# Patient Record
Sex: Female | Born: 1960
Health system: Southern US, Community
[De-identification: ages and names within clinical notes are randomized; demographics above are authoritative.]

## PROBLEM LIST (undated history)

## (undated) DIAGNOSIS — Z923 Personal history of irradiation: Secondary | ICD-10-CM

## (undated) DIAGNOSIS — M1990 Unspecified osteoarthritis, unspecified site: Secondary | ICD-10-CM

## (undated) DIAGNOSIS — F329 Major depressive disorder, single episode, unspecified: Secondary | ICD-10-CM

## (undated) DIAGNOSIS — T82591A Other mechanical complication of surgically created arteriovenous shunt, initial encounter: Secondary | ICD-10-CM

## (undated) DIAGNOSIS — S32009K Unspecified fracture of unspecified lumbar vertebra, subsequent encounter for fracture with nonunion: Secondary | ICD-10-CM

## (undated) DIAGNOSIS — R351 Nocturia: Secondary | ICD-10-CM

## (undated) DIAGNOSIS — G5600 Carpal tunnel syndrome, unspecified upper limb: Secondary | ICD-10-CM

## (undated) DIAGNOSIS — F32A Depression, unspecified: Secondary | ICD-10-CM

## (undated) DIAGNOSIS — M719 Bursopathy, unspecified: Secondary | ICD-10-CM

## (undated) DIAGNOSIS — J302 Other seasonal allergic rhinitis: Secondary | ICD-10-CM

## (undated) DIAGNOSIS — Z860100 Personal history of colon polyps, unspecified: Secondary | ICD-10-CM

## (undated) DIAGNOSIS — R0602 Shortness of breath: Secondary | ICD-10-CM

## (undated) DIAGNOSIS — F419 Anxiety disorder, unspecified: Secondary | ICD-10-CM

## (undated) DIAGNOSIS — K5732 Diverticulitis of large intestine without perforation or abscess without bleeding: Secondary | ICD-10-CM

## (undated) DIAGNOSIS — M62838 Other muscle spasm: Secondary | ICD-10-CM

## (undated) DIAGNOSIS — K219 Gastro-esophageal reflux disease without esophagitis: Secondary | ICD-10-CM

## (undated) DIAGNOSIS — G629 Polyneuropathy, unspecified: Secondary | ICD-10-CM

## (undated) DIAGNOSIS — G8929 Other chronic pain: Secondary | ICD-10-CM

## (undated) DIAGNOSIS — J189 Pneumonia, unspecified organism: Secondary | ICD-10-CM

## (undated) DIAGNOSIS — G4733 Obstructive sleep apnea (adult) (pediatric): Secondary | ICD-10-CM

## (undated) DIAGNOSIS — Z9221 Personal history of antineoplastic chemotherapy: Secondary | ICD-10-CM

## (undated) DIAGNOSIS — Z8619 Personal history of other infectious and parasitic diseases: Secondary | ICD-10-CM

## (undated) DIAGNOSIS — C50919 Malignant neoplasm of unspecified site of unspecified female breast: Secondary | ICD-10-CM

## (undated) DIAGNOSIS — R600 Localized edema: Secondary | ICD-10-CM

## (undated) DIAGNOSIS — K649 Unspecified hemorrhoids: Secondary | ICD-10-CM

## (undated) DIAGNOSIS — M2540 Effusion, unspecified joint: Secondary | ICD-10-CM

## (undated) DIAGNOSIS — M797 Fibromyalgia: Secondary | ICD-10-CM

## (undated) DIAGNOSIS — K5900 Constipation, unspecified: Secondary | ICD-10-CM

## (undated) DIAGNOSIS — D649 Anemia, unspecified: Secondary | ICD-10-CM

## (undated) DIAGNOSIS — Z9989 Dependence on other enabling machines and devices: Secondary | ICD-10-CM

## (undated) DIAGNOSIS — R0600 Dyspnea, unspecified: Secondary | ICD-10-CM

## (undated) DIAGNOSIS — E119 Type 2 diabetes mellitus without complications: Secondary | ICD-10-CM

## (undated) DIAGNOSIS — M2550 Pain in unspecified joint: Secondary | ICD-10-CM

## (undated) DIAGNOSIS — R609 Edema, unspecified: Secondary | ICD-10-CM

## (undated) DIAGNOSIS — M549 Dorsalgia, unspecified: Secondary | ICD-10-CM

## (undated) DIAGNOSIS — Z9289 Personal history of other medical treatment: Secondary | ICD-10-CM

## (undated) HISTORY — PX: EYE SURGERY: SHX253

## (undated) HISTORY — PX: TOE SURGERY: SHX1073

## (undated) HISTORY — DX: Shortness of breath: R06.02

## (undated) HISTORY — PX: TONSILLECTOMY: SUR1361

## (undated) HISTORY — DX: Carpal tunnel syndrome, unspecified upper limb: G56.00

## (undated) HISTORY — DX: Fibromyalgia: M79.7

## (undated) HISTORY — DX: Morbid (severe) obesity due to excess calories: E66.01

## (undated) HISTORY — PX: ABDOMINAL HYSTERECTOMY: SHX81

## (undated) HISTORY — DX: Type 2 diabetes mellitus without complications: E11.9

## (undated) HISTORY — DX: Malignant neoplasm of unspecified site of unspecified female breast: C50.919

## (undated) HISTORY — PX: KNEE SURGERY: SHX244

## (undated) HISTORY — DX: Obstructive sleep apnea (adult) (pediatric): G47.33

## (undated) HISTORY — DX: Unspecified osteoarthritis, unspecified site: M19.90

## (undated) HISTORY — PX: APPENDECTOMY: SHX54

## (undated) HISTORY — PX: OTHER SURGICAL HISTORY: SHX169

## (undated) HISTORY — PX: COLONOSCOPY: SHX174

## (undated) HISTORY — PX: CHOLECYSTECTOMY: SHX55

## (undated) HISTORY — DX: Bursopathy, unspecified: M71.9

## (undated) HISTORY — DX: Dependence on other enabling machines and devices: Z99.89

## (undated) HISTORY — PX: CARPAL TUNNEL RELEASE: SHX101

---

## 1997-11-26 ENCOUNTER — Other Ambulatory Visit: Admission: RE | Admit: 1997-11-26 | Discharge: 1997-11-26 | Payer: Self-pay | Admitting: Family Medicine

## 1998-12-21 ENCOUNTER — Ambulatory Visit (HOSPITAL_COMMUNITY): Admission: RE | Admit: 1998-12-21 | Discharge: 1998-12-21 | Payer: Self-pay | Admitting: Family Medicine

## 1998-12-21 ENCOUNTER — Encounter: Payer: Self-pay | Admitting: Family Medicine

## 1999-02-07 ENCOUNTER — Other Ambulatory Visit: Admission: RE | Admit: 1999-02-07 | Discharge: 1999-02-07 | Payer: Self-pay | Admitting: Obstetrics & Gynecology

## 1999-02-07 ENCOUNTER — Ambulatory Visit (HOSPITAL_COMMUNITY): Admission: RE | Admit: 1999-02-07 | Discharge: 1999-02-07 | Payer: Self-pay | Admitting: Family Medicine

## 1999-02-07 ENCOUNTER — Encounter: Payer: Self-pay | Admitting: Family Medicine

## 1999-02-07 ENCOUNTER — Encounter: Admission: RE | Admit: 1999-02-07 | Discharge: 1999-02-07 | Payer: Self-pay | Admitting: Obstetrics & Gynecology

## 1999-02-28 ENCOUNTER — Encounter: Admission: RE | Admit: 1999-02-28 | Discharge: 1999-02-28 | Payer: Self-pay | Admitting: Obstetrics & Gynecology

## 1999-03-14 ENCOUNTER — Inpatient Hospital Stay (HOSPITAL_COMMUNITY): Admission: AD | Admit: 1999-03-14 | Discharge: 1999-03-16 | Payer: Self-pay | Admitting: Obstetrics

## 1999-03-14 ENCOUNTER — Encounter: Admission: RE | Admit: 1999-03-14 | Discharge: 1999-03-14 | Payer: Self-pay | Admitting: Obstetrics & Gynecology

## 1999-03-20 ENCOUNTER — Encounter: Admission: RE | Admit: 1999-03-20 | Discharge: 1999-06-18 | Payer: Self-pay | Admitting: Family Medicine

## 1999-03-21 ENCOUNTER — Encounter: Admission: RE | Admit: 1999-03-21 | Discharge: 1999-03-21 | Payer: Self-pay | Admitting: Obstetrics & Gynecology

## 1999-03-21 ENCOUNTER — Other Ambulatory Visit: Admission: RE | Admit: 1999-03-21 | Discharge: 1999-03-21 | Payer: Self-pay | Admitting: Obstetrics & Gynecology

## 1999-04-06 ENCOUNTER — Encounter (INDEPENDENT_AMBULATORY_CARE_PROVIDER_SITE_OTHER): Payer: Self-pay | Admitting: Specialist

## 1999-04-06 ENCOUNTER — Inpatient Hospital Stay (HOSPITAL_COMMUNITY): Admission: AD | Admit: 1999-04-06 | Discharge: 1999-04-09 | Payer: Self-pay | Admitting: *Deleted

## 1999-04-11 ENCOUNTER — Inpatient Hospital Stay (HOSPITAL_COMMUNITY): Admission: AD | Admit: 1999-04-11 | Discharge: 1999-04-11 | Payer: Self-pay | Admitting: *Deleted

## 1999-04-12 ENCOUNTER — Inpatient Hospital Stay (HOSPITAL_COMMUNITY): Admission: AD | Admit: 1999-04-12 | Discharge: 1999-04-12 | Payer: Self-pay | Admitting: Obstetrics

## 1999-04-14 ENCOUNTER — Inpatient Hospital Stay (HOSPITAL_COMMUNITY): Admission: AD | Admit: 1999-04-14 | Discharge: 1999-04-14 | Payer: Self-pay | Admitting: Obstetrics & Gynecology

## 1999-04-15 ENCOUNTER — Encounter (HOSPITAL_COMMUNITY): Admission: RE | Admit: 1999-04-15 | Discharge: 1999-07-14 | Payer: Self-pay | Admitting: Obstetrics & Gynecology

## 1999-05-02 ENCOUNTER — Encounter: Admission: RE | Admit: 1999-05-02 | Discharge: 1999-05-02 | Payer: Self-pay | Admitting: Obstetrics & Gynecology

## 1999-05-09 ENCOUNTER — Encounter: Admission: RE | Admit: 1999-05-09 | Discharge: 1999-05-09 | Payer: Self-pay | Admitting: Obstetrics & Gynecology

## 1999-05-11 ENCOUNTER — Encounter: Admission: RE | Admit: 1999-05-11 | Discharge: 1999-05-11 | Payer: Self-pay | Admitting: Obstetrics

## 1999-05-16 ENCOUNTER — Encounter: Admission: RE | Admit: 1999-05-16 | Discharge: 1999-05-16 | Payer: Self-pay | Admitting: Obstetrics & Gynecology

## 1999-05-30 ENCOUNTER — Encounter: Admission: RE | Admit: 1999-05-30 | Discharge: 1999-05-30 | Payer: Self-pay | Admitting: Obstetrics & Gynecology

## 1999-06-06 ENCOUNTER — Encounter: Admission: RE | Admit: 1999-06-06 | Discharge: 1999-06-06 | Payer: Self-pay | Admitting: Obstetrics & Gynecology

## 1999-06-13 ENCOUNTER — Encounter: Admission: RE | Admit: 1999-06-13 | Discharge: 1999-06-13 | Payer: Self-pay | Admitting: Obstetrics & Gynecology

## 1999-07-10 ENCOUNTER — Encounter (INDEPENDENT_AMBULATORY_CARE_PROVIDER_SITE_OTHER): Payer: Self-pay | Admitting: Specialist

## 1999-07-10 ENCOUNTER — Ambulatory Visit (HOSPITAL_BASED_OUTPATIENT_CLINIC_OR_DEPARTMENT_OTHER): Admission: RE | Admit: 1999-07-10 | Discharge: 1999-07-10 | Payer: Self-pay | Admitting: Specialist

## 1999-08-06 ENCOUNTER — Emergency Department (HOSPITAL_COMMUNITY): Admission: EM | Admit: 1999-08-06 | Discharge: 1999-08-06 | Payer: Self-pay | Admitting: Emergency Medicine

## 1999-08-06 ENCOUNTER — Encounter: Payer: Self-pay | Admitting: Emergency Medicine

## 1999-08-28 ENCOUNTER — Observation Stay (HOSPITAL_COMMUNITY): Admission: RE | Admit: 1999-08-28 | Discharge: 1999-08-29 | Payer: Self-pay | Admitting: Surgery

## 1999-08-28 ENCOUNTER — Encounter (INDEPENDENT_AMBULATORY_CARE_PROVIDER_SITE_OTHER): Payer: Self-pay

## 1999-09-07 ENCOUNTER — Encounter: Admission: RE | Admit: 1999-09-07 | Discharge: 1999-09-07 | Payer: Self-pay | Admitting: Obstetrics

## 2000-01-02 ENCOUNTER — Encounter: Admission: RE | Admit: 2000-01-02 | Discharge: 2000-04-01 | Payer: Self-pay | Admitting: Family Medicine

## 2000-04-05 ENCOUNTER — Encounter: Admission: RE | Admit: 2000-04-05 | Discharge: 2000-04-05 | Payer: Self-pay | Admitting: Obstetrics & Gynecology

## 2000-04-24 ENCOUNTER — Ambulatory Visit (HOSPITAL_COMMUNITY): Admission: RE | Admit: 2000-04-24 | Discharge: 2000-04-24 | Payer: Self-pay

## 2001-03-24 ENCOUNTER — Encounter: Payer: Self-pay | Admitting: Family Medicine

## 2001-03-24 ENCOUNTER — Ambulatory Visit (HOSPITAL_COMMUNITY): Admission: RE | Admit: 2001-03-24 | Discharge: 2001-03-24 | Payer: Self-pay | Admitting: Family Medicine

## 2002-02-06 ENCOUNTER — Other Ambulatory Visit: Admission: RE | Admit: 2002-02-06 | Discharge: 2002-02-06 | Payer: Self-pay | Admitting: Family Medicine

## 2002-03-13 ENCOUNTER — Ambulatory Visit (HOSPITAL_COMMUNITY): Admission: RE | Admit: 2002-03-13 | Discharge: 2002-03-13 | Payer: Self-pay | Admitting: Family Medicine

## 2002-08-14 ENCOUNTER — Emergency Department (HOSPITAL_COMMUNITY): Admission: EM | Admit: 2002-08-14 | Discharge: 2002-08-14 | Payer: Self-pay | Admitting: Unknown Physician Specialty

## 2003-04-16 ENCOUNTER — Ambulatory Visit (HOSPITAL_COMMUNITY): Admission: RE | Admit: 2003-04-16 | Discharge: 2003-04-16 | Payer: Self-pay | Admitting: Internal Medicine

## 2003-05-15 ENCOUNTER — Emergency Department (HOSPITAL_COMMUNITY): Admission: EM | Admit: 2003-05-15 | Discharge: 2003-05-15 | Payer: Self-pay | Admitting: Emergency Medicine

## 2004-03-25 ENCOUNTER — Inpatient Hospital Stay (HOSPITAL_COMMUNITY): Admission: EM | Admit: 2004-03-25 | Discharge: 2004-03-26 | Payer: Self-pay | Admitting: Emergency Medicine

## 2004-03-25 IMAGING — CT CT PELVIS W/O CM
1 of 2 series · 15 of 32 positions shown, 19 images · non-contrast
Comparison: none

CLINICAL DATA: Lower abdomen pain.
 CT ABDOMEN WITHOUT CONTRAST
 Multidetector helical scans through the abdomen were performed in the unenhanced state.
 The lung bases are clear.  No renal calculi are seen and there is no evidence of hydronephrosis.  The remainder of the study shows the liver to appear normal in the unenhanced state.  Surgical clips are present from prior cholecystectomy.  The pancreas is normal in size, and the adrenal glands and spleen appear normal.  There are a few varices noted in the left upper quadrant.

[Series 2: urogram 5.0 b30f · axial · 0.81mm/px · z∈[-434,-42]mm · 15 of 108 slices shown, 19 images]
[im 5/108  soft-tissue]
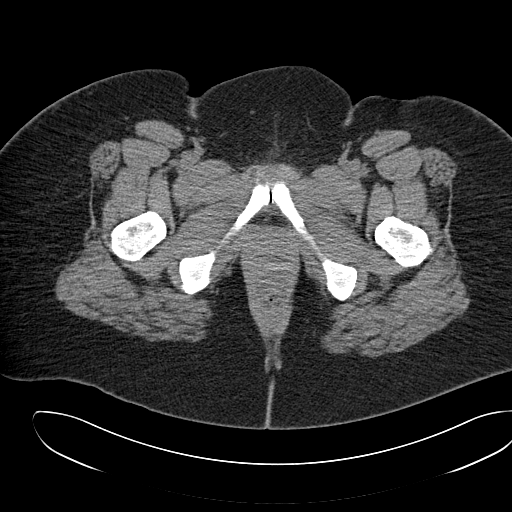
[im 5/108  bone]
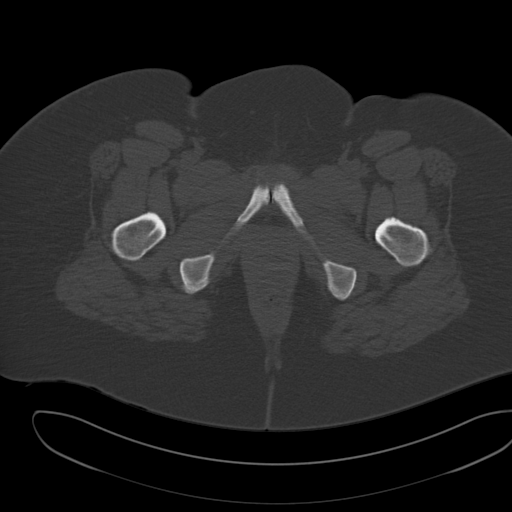
[im 13/108  soft-tissue]
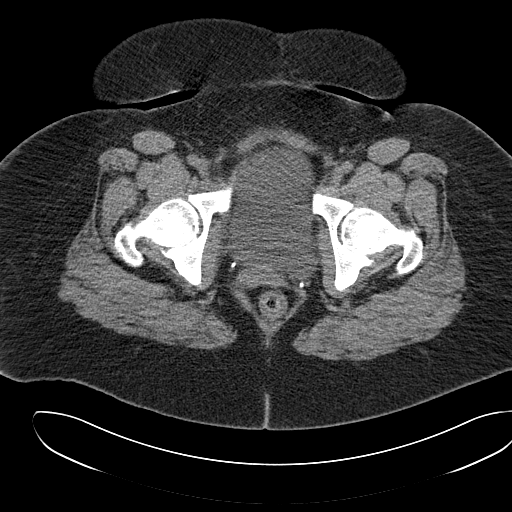
[im 22/108  soft-tissue]
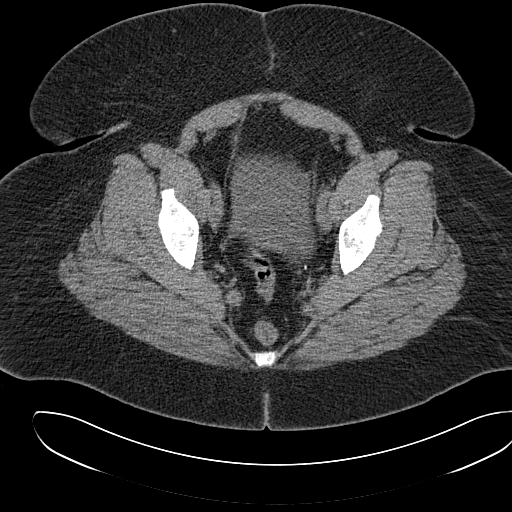
[im 30/108  soft-tissue]
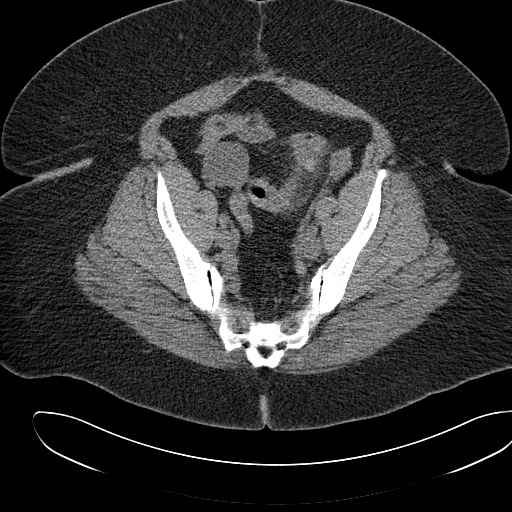
[im 39/108  soft-tissue]
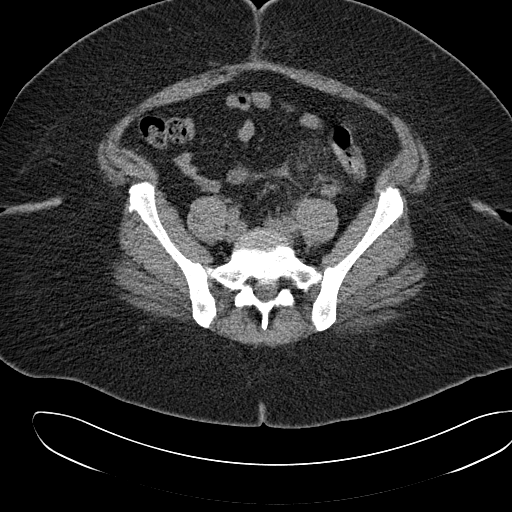
[im 48/108  soft-tissue]
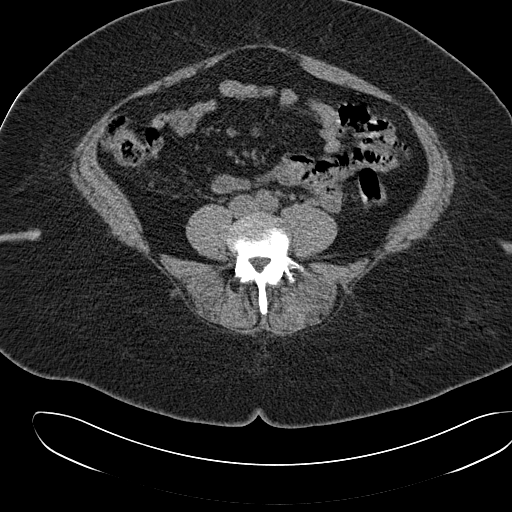
[im 56/108  soft-tissue]
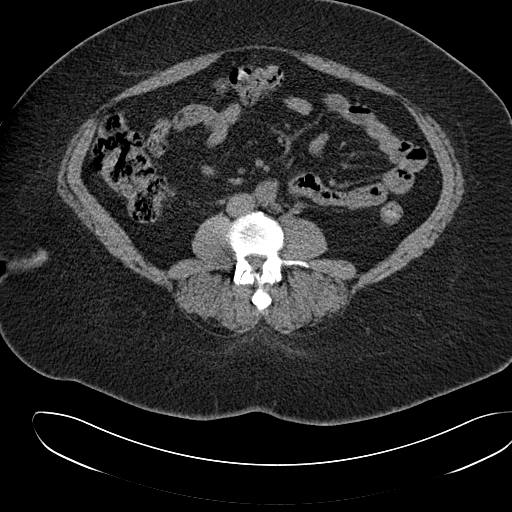
[im 60/108  soft-tissue]
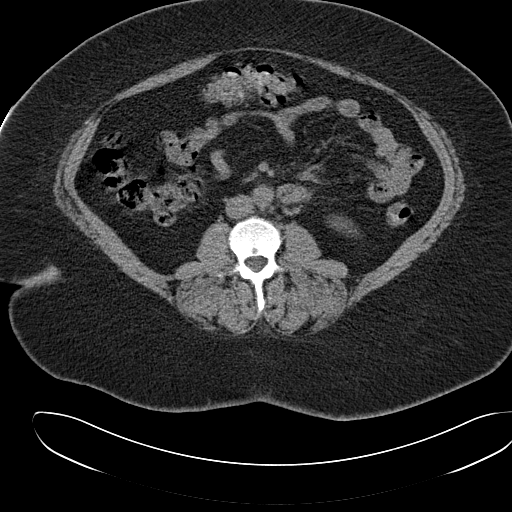
[im 69/108  soft-tissue]
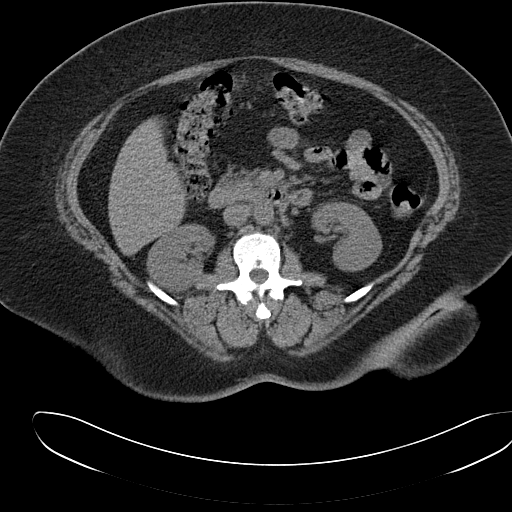
[im 69/108  bone]
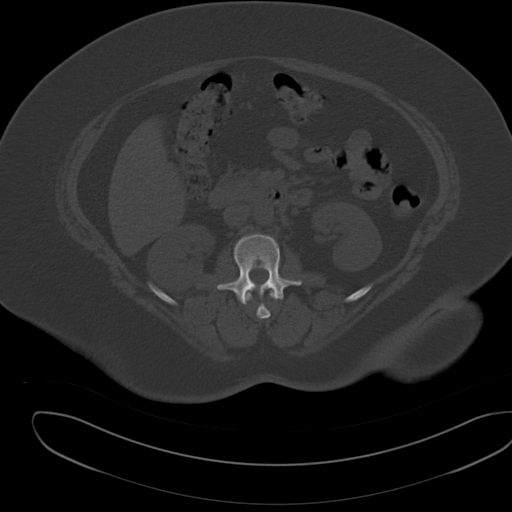
[im 78/108  soft-tissue]
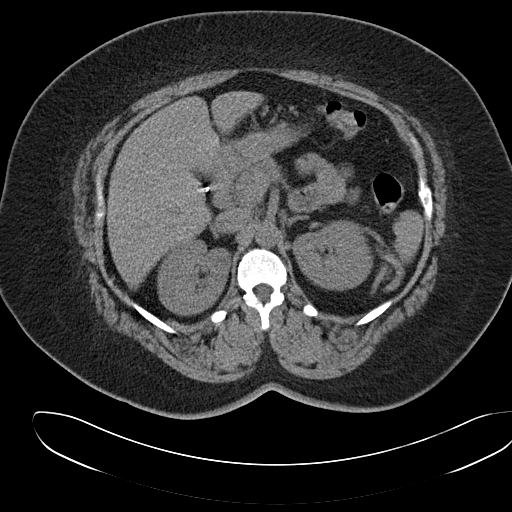
[im 86/108  soft-tissue]
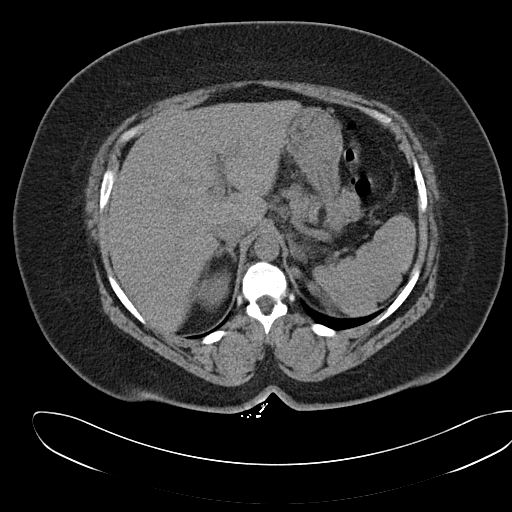
[im 90/108  lung]
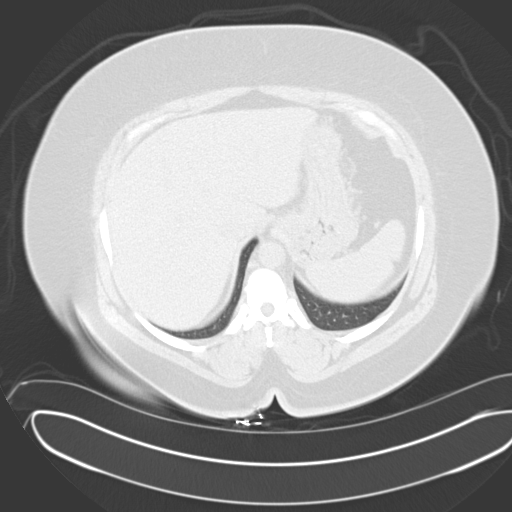
[im 95/108  soft-tissue]
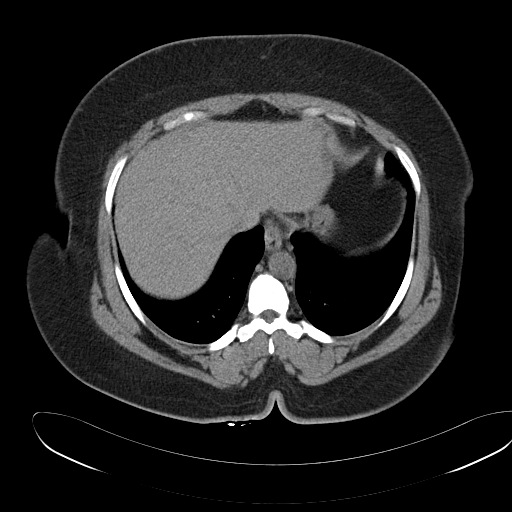
[im 95/108  lung]
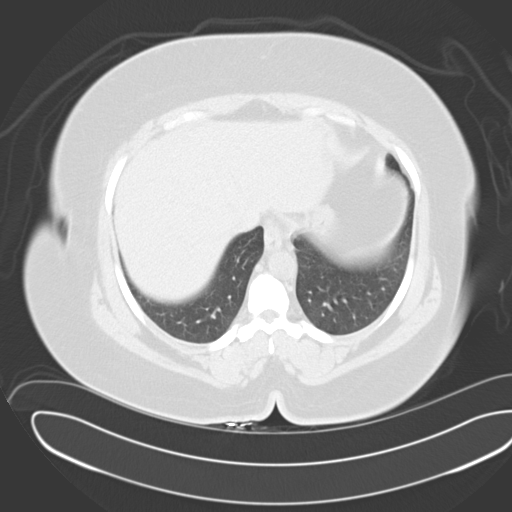
[im 99/108  lung]
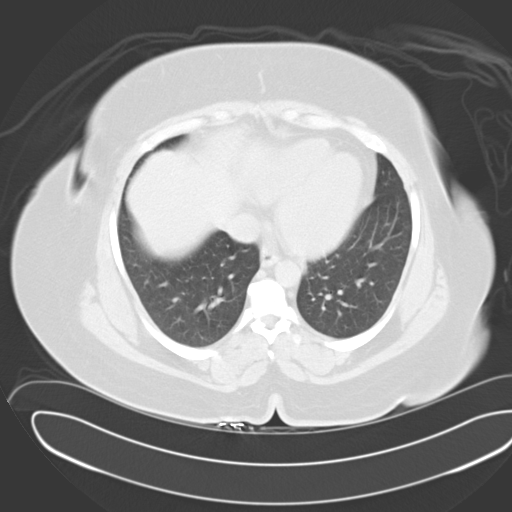
[im 103/108  soft-tissue]
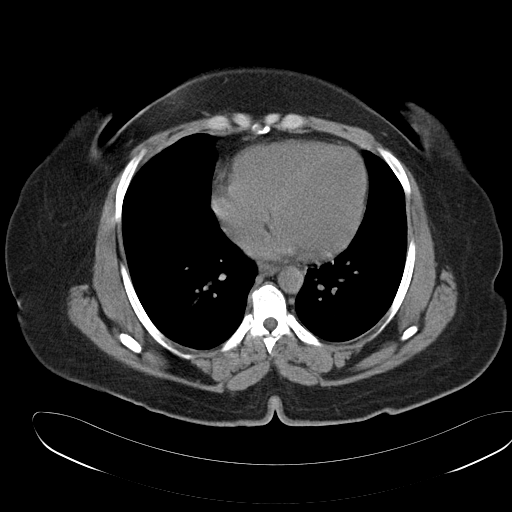
[im 103/108  lung]
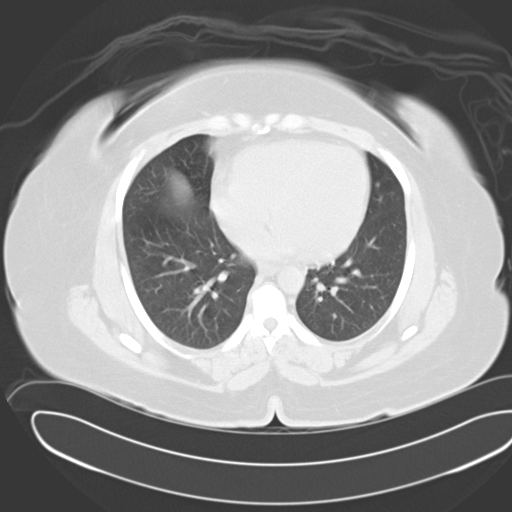

[15 of 32 positions shown; findings below may reference images not displayed]

IMPRESSION: 1.  No renal calculi.  No hydronephrosis.
 2.  Left upper quadrant varices.
 CT PELVIS WITHOUT CONTRAST
 Scans were continued through the pelvis in the unenhanced state.
 There is pericolonic strandiness surrounding the sigmoid colon where there is some thickening of the mucosa, consistent with mild sigmoid diverticulitis.  No abscess is seen.  A cystic structure is also noted anteriorly in the right pelvis with peripheral calcification.  This probably represents a right adnexal cyst with attenuation of 16 Hounsfield units measuring 39 x 39 mm.  In view of the calcification, follow-up ultrasound is recommended.  The uterus has been removed.  The urinary bladder is unremarkable.  The appendix appears normal in size.
IMPRESSION: 1.  Mild sigmoid diverticulitis.  No abscess.
 2.  Probable 4 cm right adnexal cyst with peripheral calcification.  Suggest by ultrasound in two months.
 3.  The appendix appears normal.

## 2004-03-26 IMAGING — US US PELVIS COMPLETE MODIFY
1 series · 14 of 25 positions shown · non-contrast
Comparison: none

CLINICAL DATA: History of sigmoid diverticulitis and cyst in right pelvis with peripheral calcification by yesterday?s CT.
 ULTRASOUND OF THE PELVIS:
 Transabdominal and transvaginal ultrasound of the pelvis was performed and the CT from yesterday was reviewed.  The patient has previously undergone hysterectomy several years ago.  Bowel occupies much of the pelvis.  In addition to the bowel, the patient is rather large and ultrasound assessment is limited.  The area of low attenuation noted in the anterior right pelvis on yesterday?s CT is not visible on today?s ultrasound.  Follow-up by either CT or preferably MRI would be indicated to assess further.

[Series 1: gyv · 0.37mm/px · 14 of 43 slices shown]
[im 1/43]
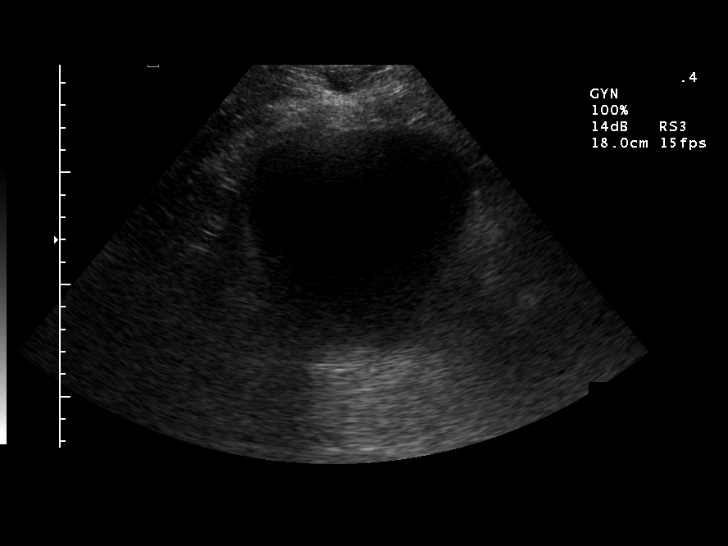
[im 4/43]
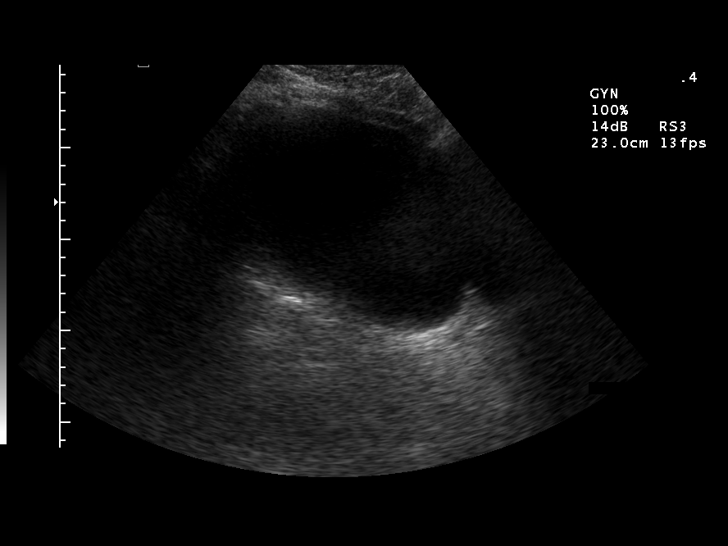
[im 8/43]
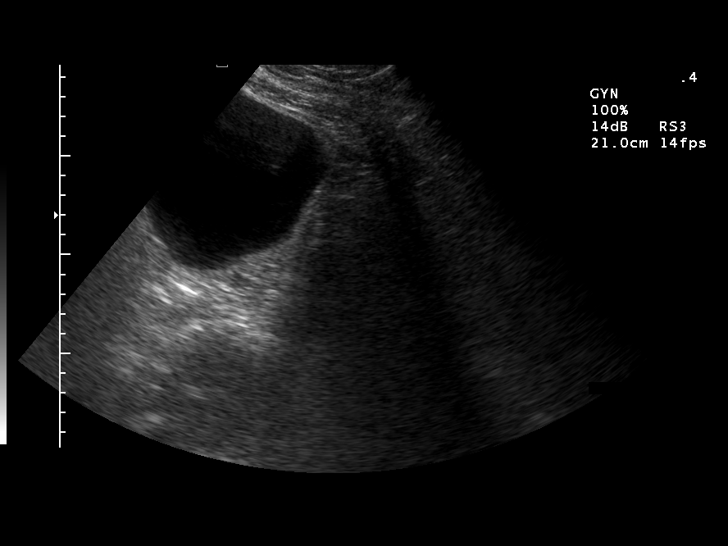
[im 11/43]
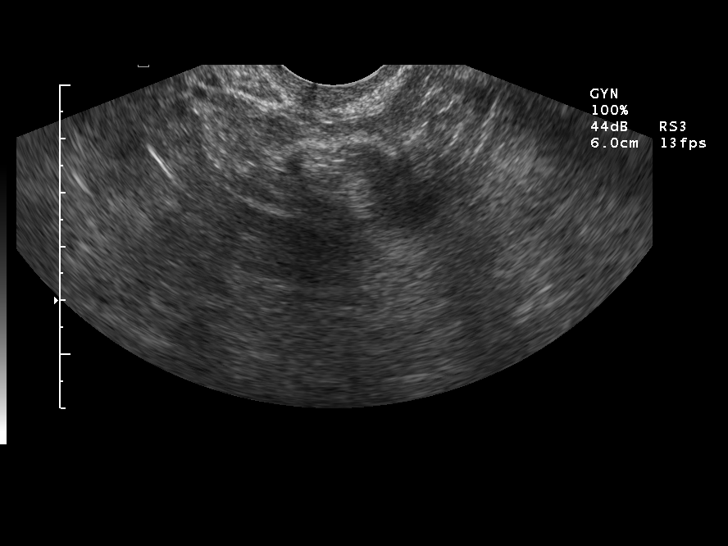
[im 15/43]
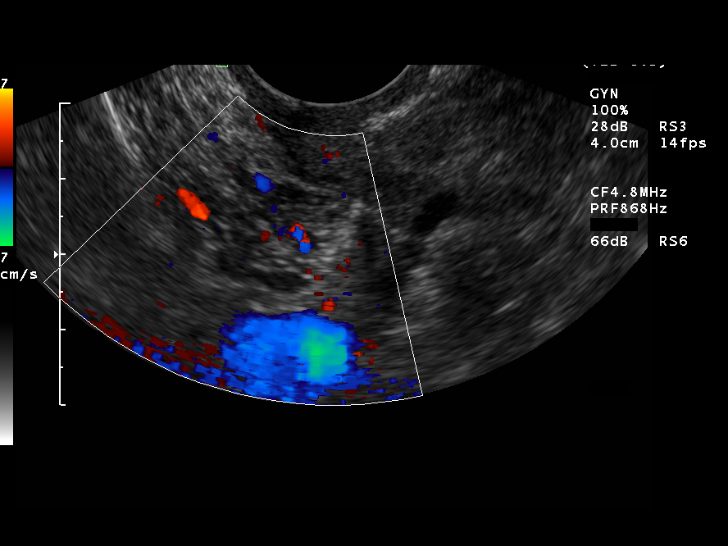
[im 16/43]
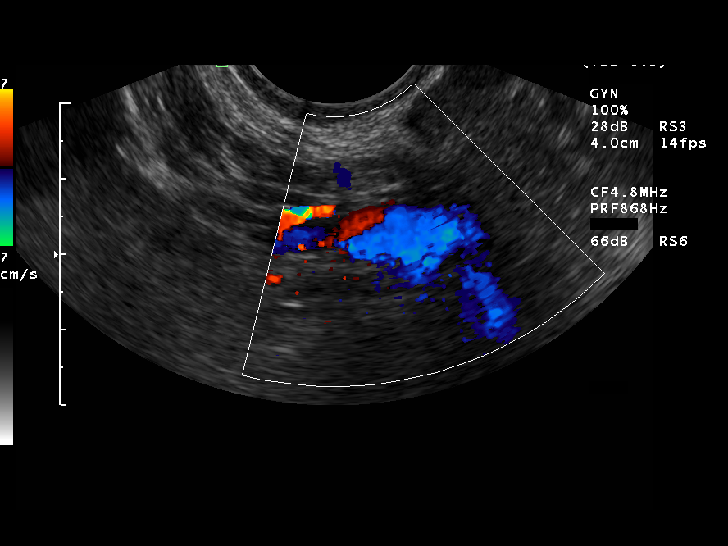
[im 20/43]
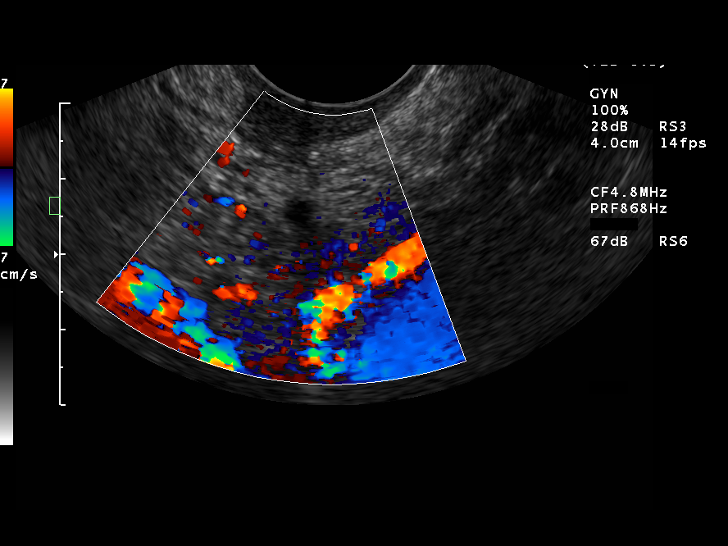
[im 23/43]
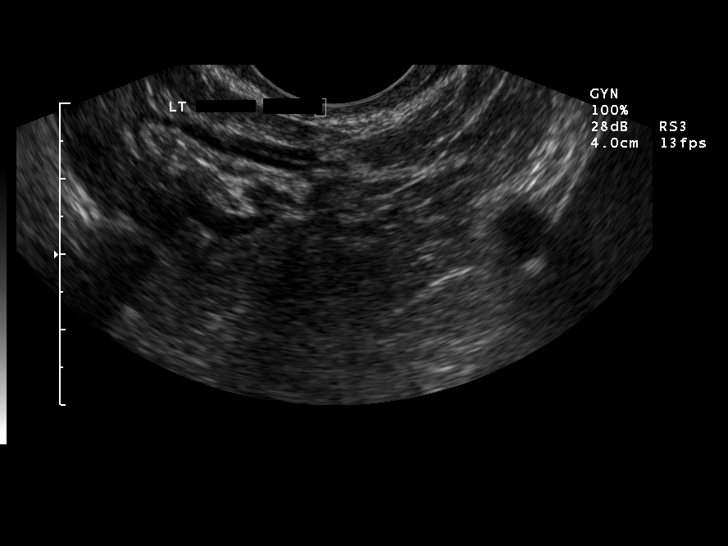
[im 27/43]
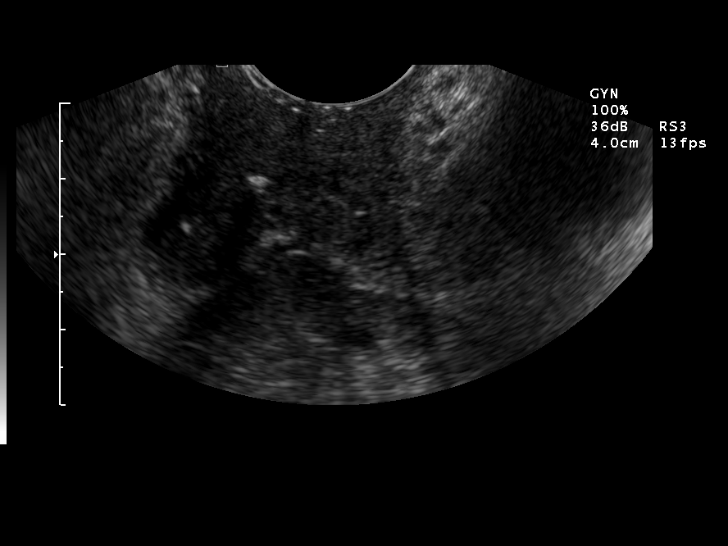
[im 29/43]
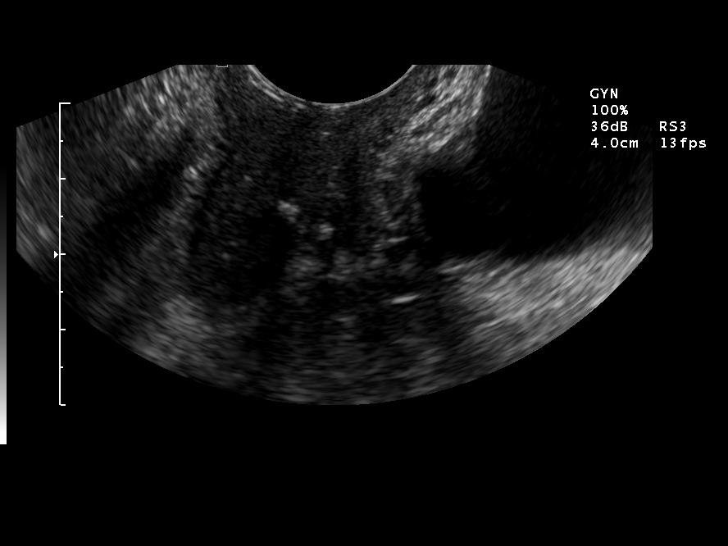
[im 32/43]
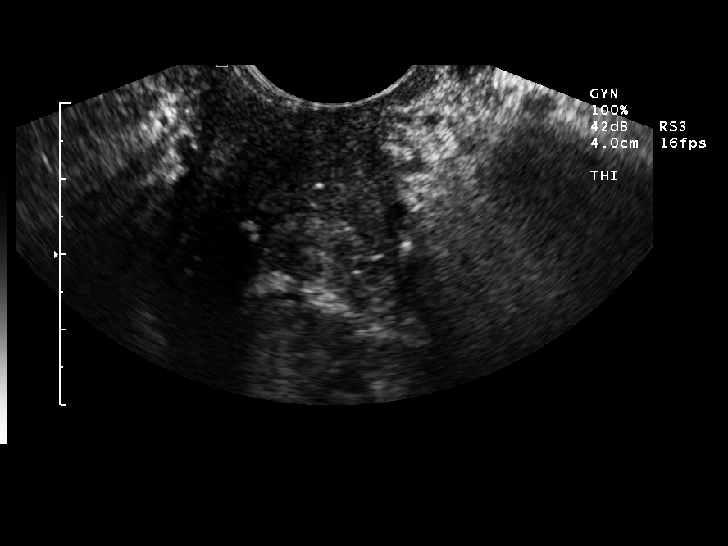
[im 36/43]
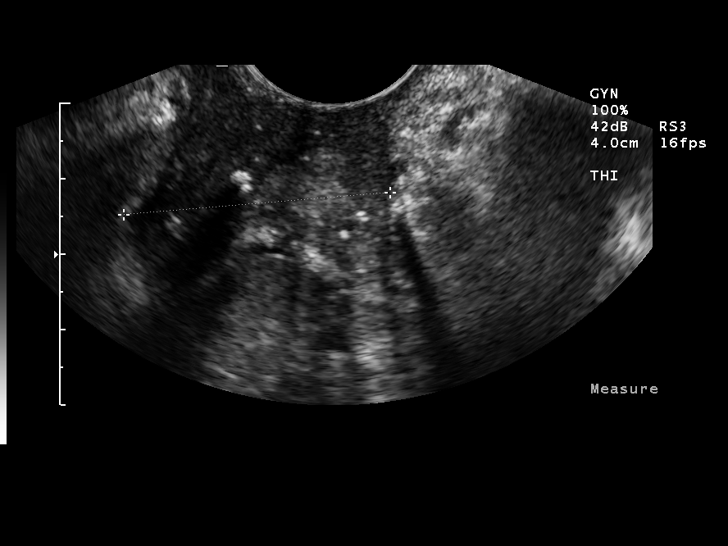
[im 39/43]
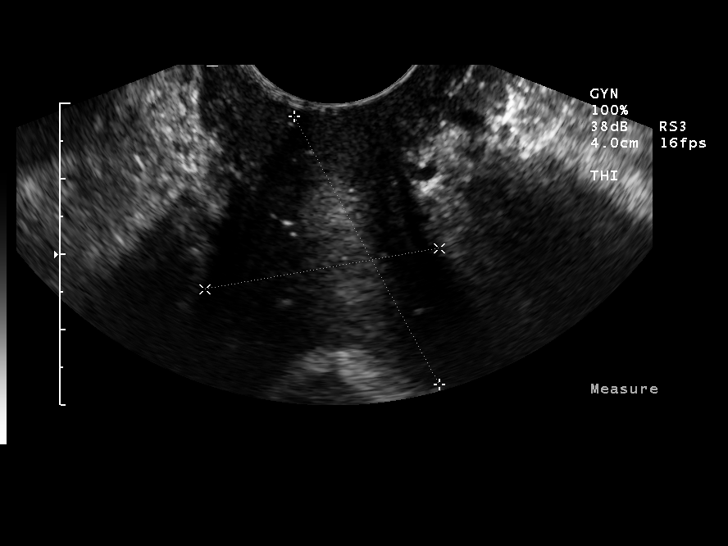
[im 43/43]
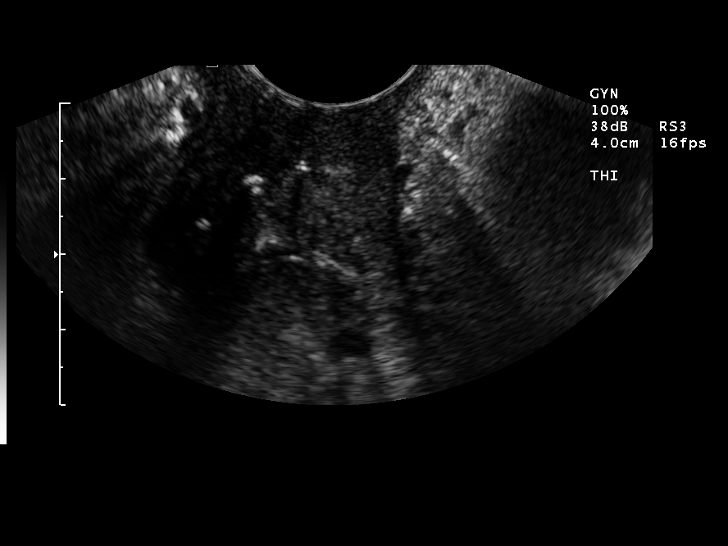

[14 of 25 positions shown; findings below may reference images not displayed]

IMPRESSION: 1.  The low attenuation area noted on yesterday?s CT in the anterior right pelvis is not visible by ultrasound as described above.  Suggest either follow-up by CT or preferably MRI to assess further.
 2.  Prior hysterectomy.

## 2004-05-19 ENCOUNTER — Ambulatory Visit (HOSPITAL_COMMUNITY): Admission: RE | Admit: 2004-05-19 | Discharge: 2004-05-19 | Payer: Self-pay | Admitting: Obstetrics & Gynecology

## 2004-06-12 ENCOUNTER — Ambulatory Visit (HOSPITAL_COMMUNITY): Admission: RE | Admit: 2004-06-12 | Discharge: 2004-06-12 | Payer: Self-pay | Admitting: *Deleted

## 2004-06-12 ENCOUNTER — Encounter (INDEPENDENT_AMBULATORY_CARE_PROVIDER_SITE_OTHER): Payer: Self-pay | Admitting: Specialist

## 2006-01-30 ENCOUNTER — Emergency Department (HOSPITAL_COMMUNITY): Admission: EM | Admit: 2006-01-30 | Discharge: 2006-01-30 | Payer: Self-pay | Admitting: Emergency Medicine

## 2006-01-30 IMAGING — CR DG LUMBAR SPINE COMPLETE 4+V
5 series · 5 of 5 positions shown · non-contrast
Comparison: None.

CLINICAL DATA: 45-year-old with back and leg pain.
 LUMBAR SPINE ? 5 VIEW:

[t l-spine a.p.]
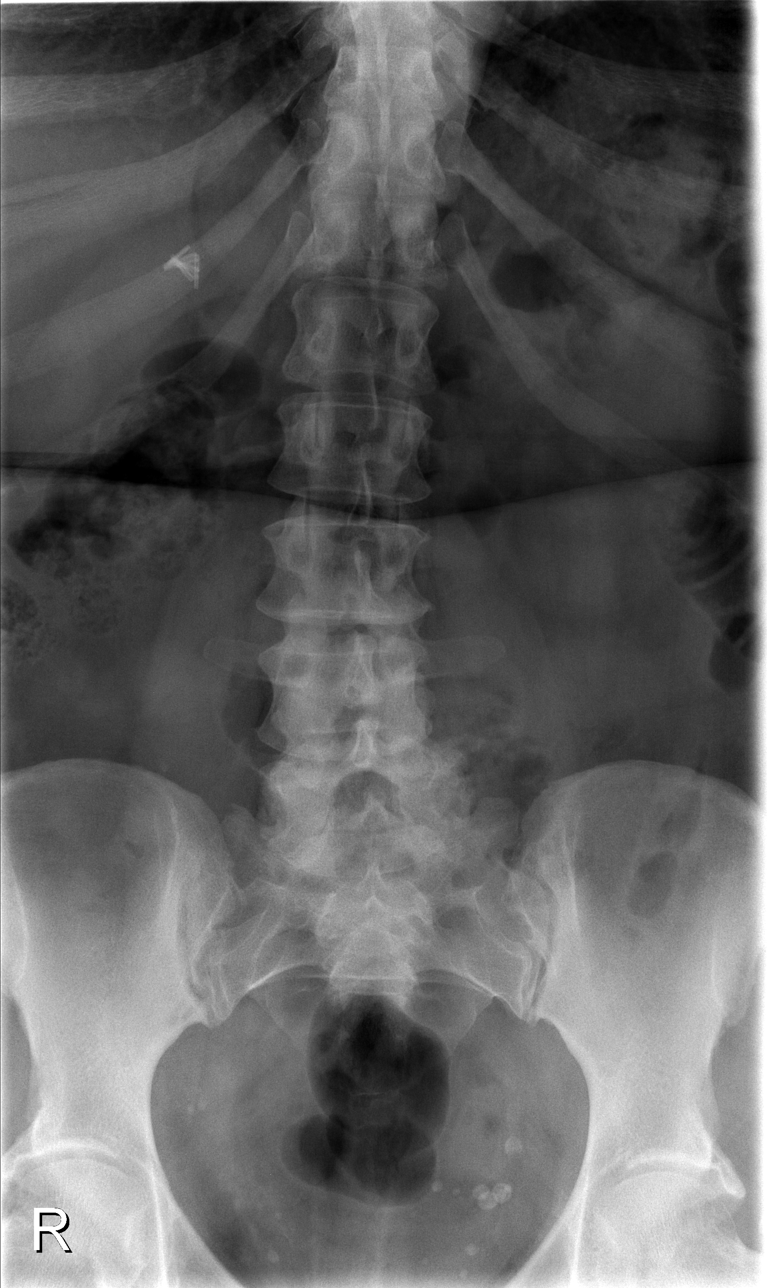

[t l-spine oblique exposure (1 of 2)]
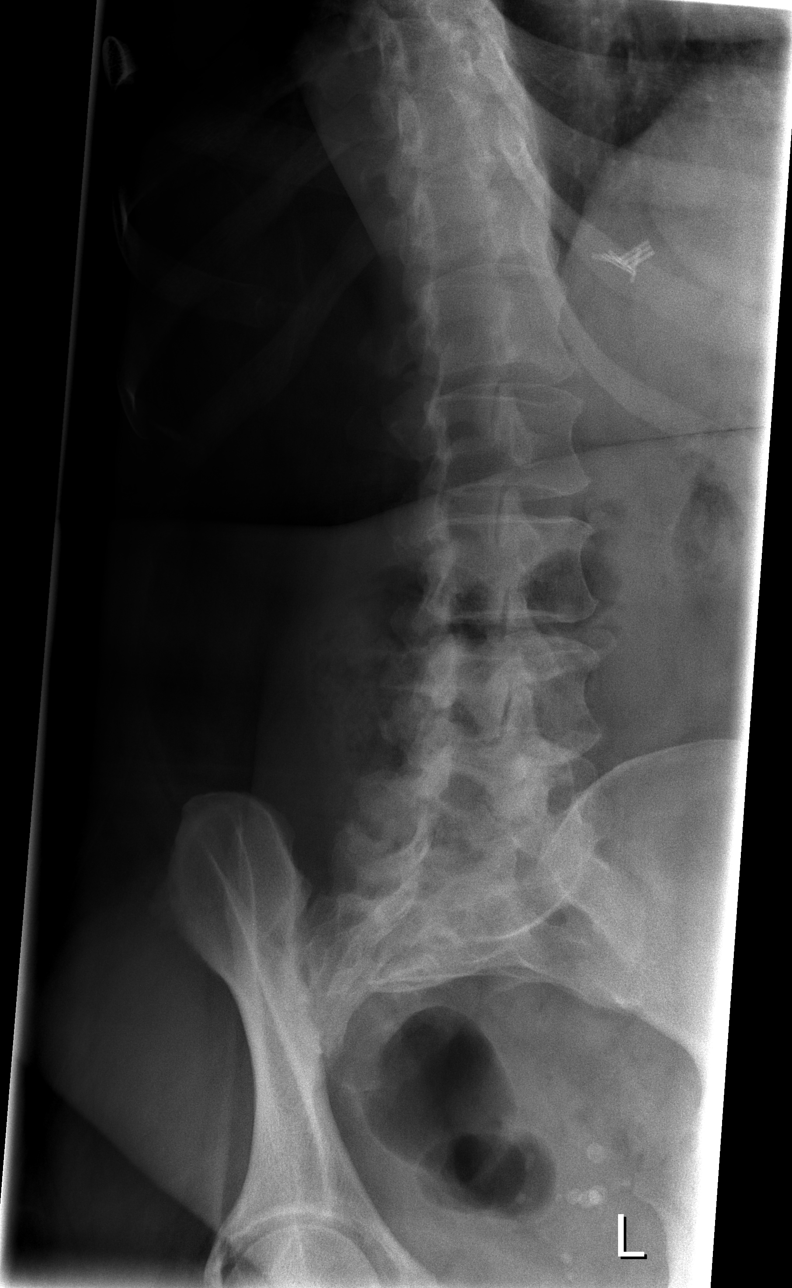

[t l-spine oblique exposure (2 of 2)]
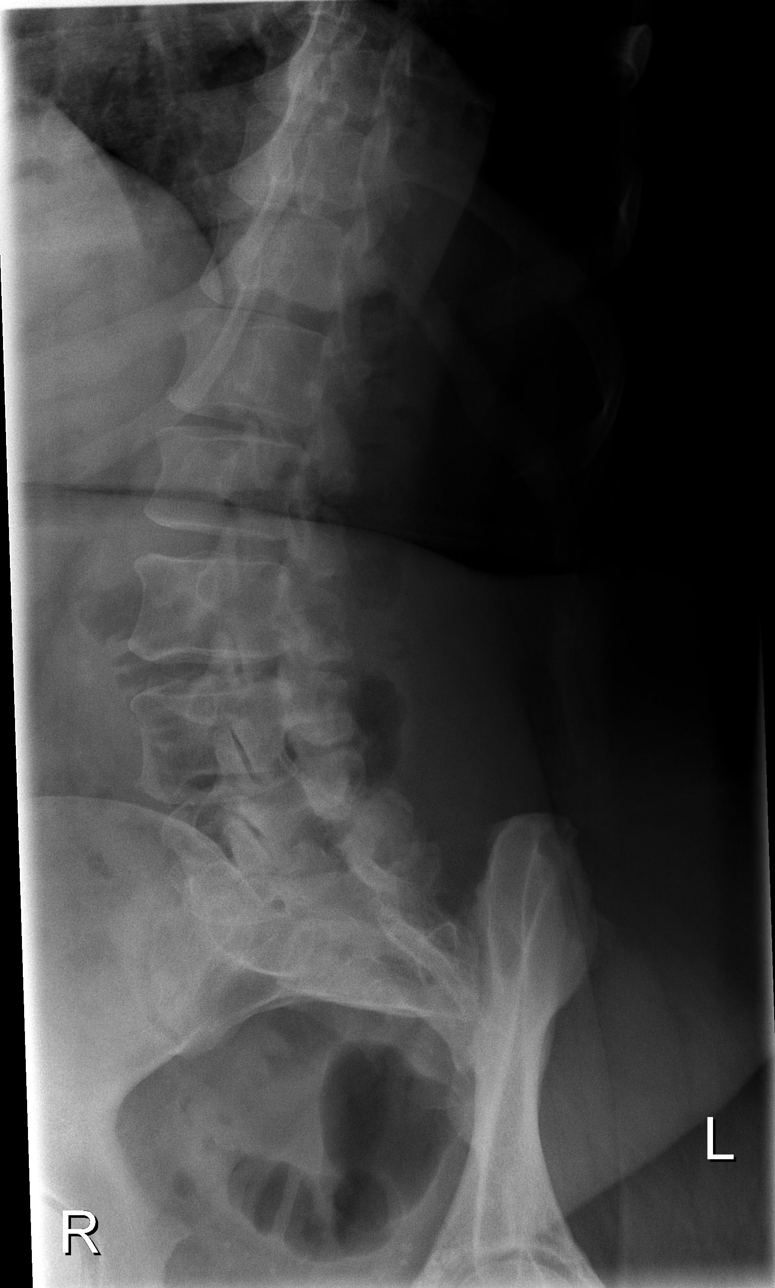

[t l-spine lat]
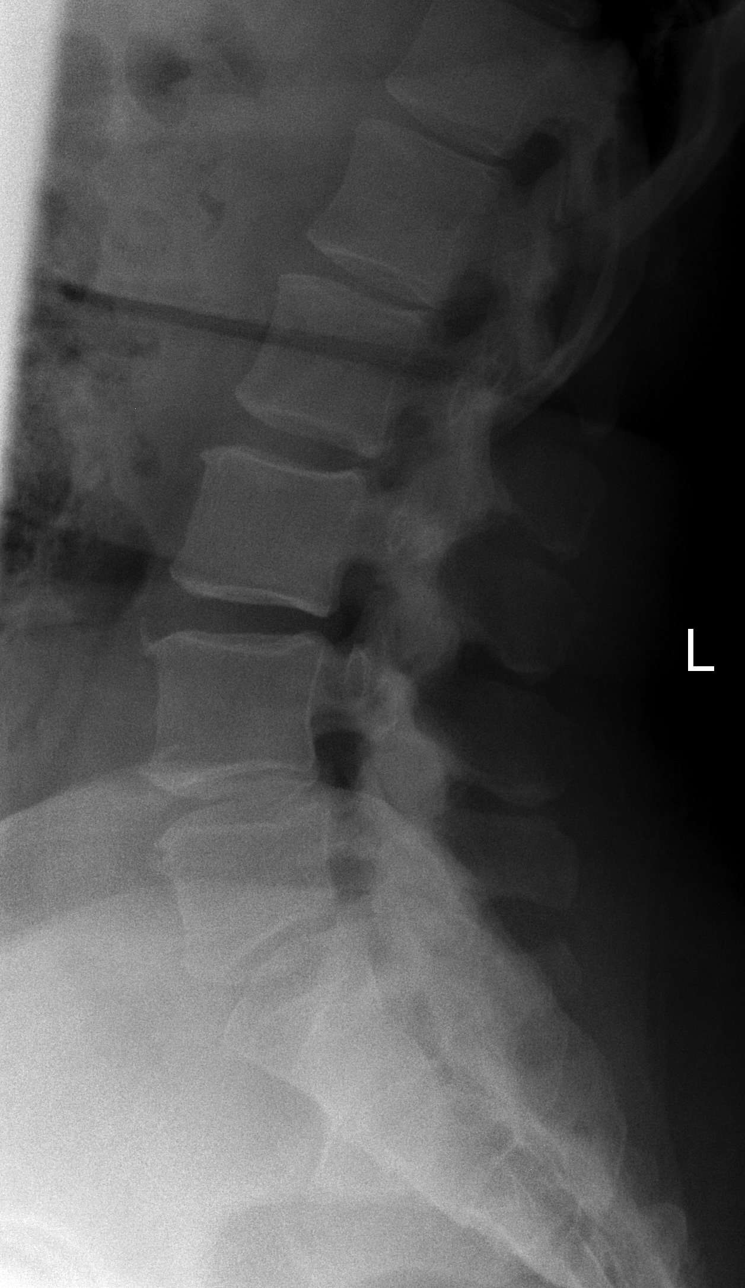

[t l-spine l5-s1 spot]
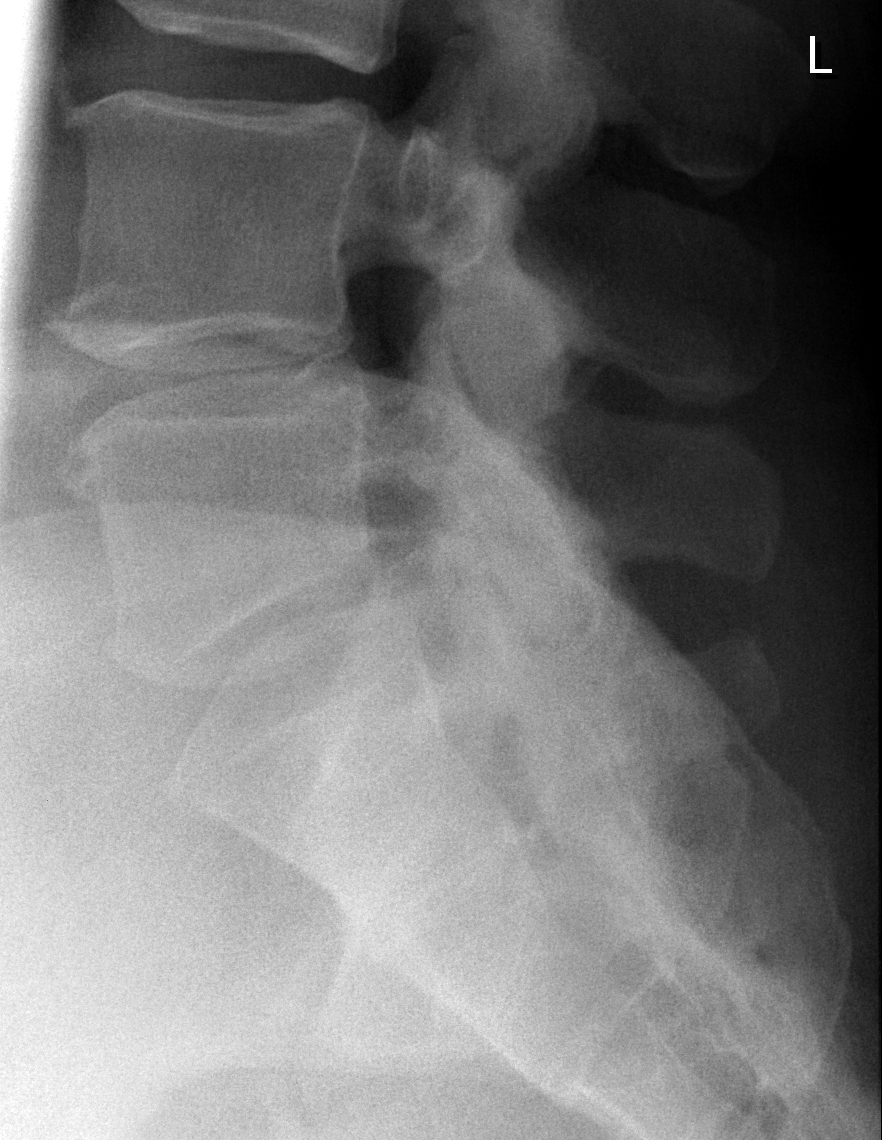

[5 of 5 positions shown; findings below may reference images not displayed]

FINDINGS: Lateral films demonstrate normal overall alignment.  Disc spaces are fairly well maintained with mild degenerative disease at L4-5 and L5-S1.  No acute bony findings.  Moderate facet degenerative change in the lower lumbar spine, but no pars defects.  Mild scoliotic curvature in the lower lumbar spine with a slight rotatory component.  Visualized bony pelvic structures appear normal.
IMPRESSION: 1. Mild scoliotic curvature and mild degenerative disc disease at L4-5 and L5-S1. 
 2. Facet degenerative changes but no pars defects.

## 2006-03-26 ENCOUNTER — Ambulatory Visit (HOSPITAL_COMMUNITY): Admission: EM | Admit: 2006-03-26 | Discharge: 2006-03-27 | Payer: Self-pay | Admitting: Emergency Medicine

## 2006-03-26 ENCOUNTER — Encounter (INDEPENDENT_AMBULATORY_CARE_PROVIDER_SITE_OTHER): Payer: Self-pay | Admitting: *Deleted

## 2006-03-26 IMAGING — CT CT ABDOMEN W/ CM
2 of 4 series · 14 of 36 positions shown, 19 images · IV contrast (omnipaque)
Comparison: CT 03/25/04.
COMPARISON: 03/25/04.

CLINICAL DATA: Abdominal pain.  Nausea and vomiting. 
 ABDOMEN CT WITH CONTRAST:
TECHNIQUE: Multidetector CT imaging of the abdomen was performed following the standard protocol during bolus administration of intravenous contrast.
 Contrast:  125 cc Omnipaque 300
TECHNIQUE: Multidetector CT imaging of the pelvis was performed following the standard protocol during bolus administration of intravenous contrast.

[Series 2: abd_pel 5.0 b40s · axial · 0.89mm/px · z∈[+951,+1351]mm · 13 of 90 slices shown, 17 images]
[im 5/90  soft-tissue]
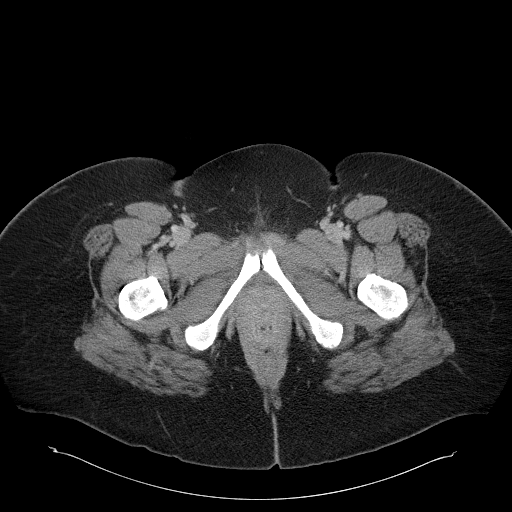
[im 5/90  bone]
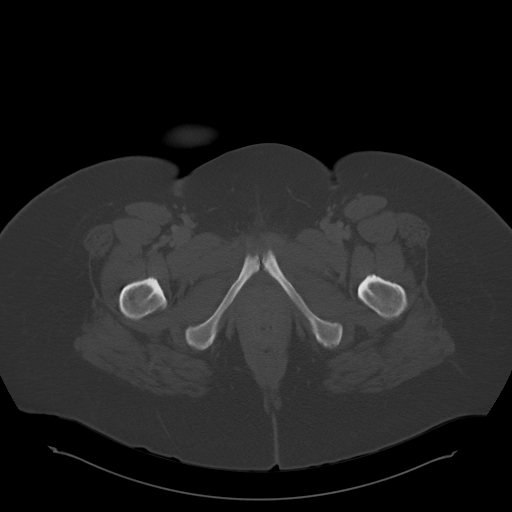
[im 15/90  soft-tissue]
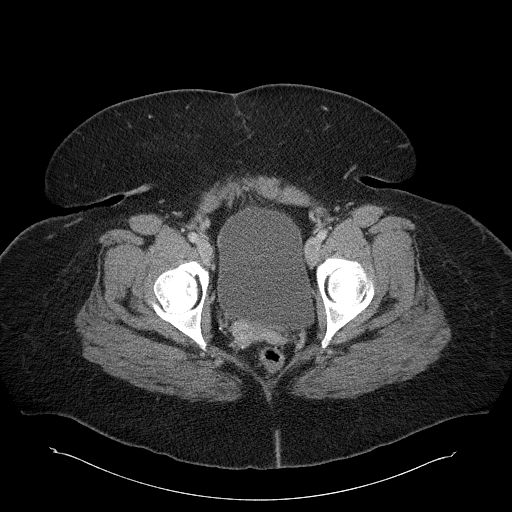
[im 24/90  soft-tissue]
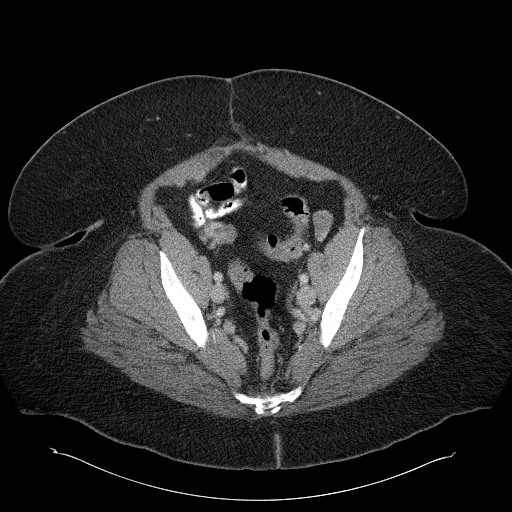
[im 29/90  soft-tissue]
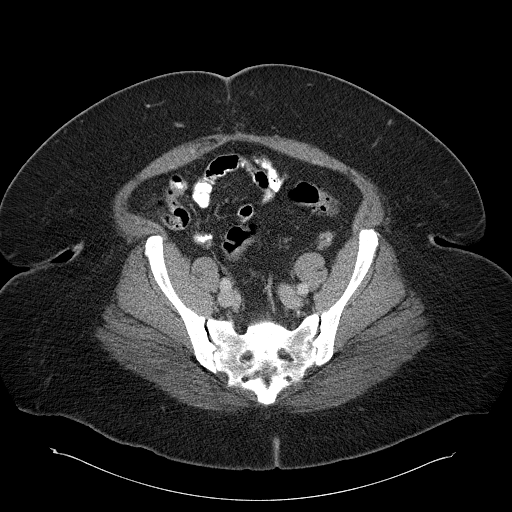
[im 38/90  soft-tissue]
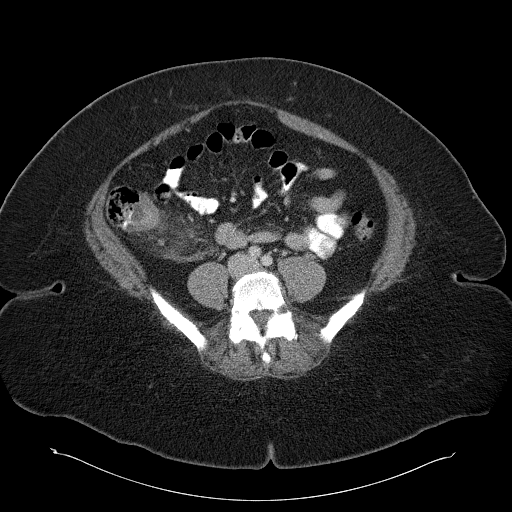
[im 47/90  soft-tissue]
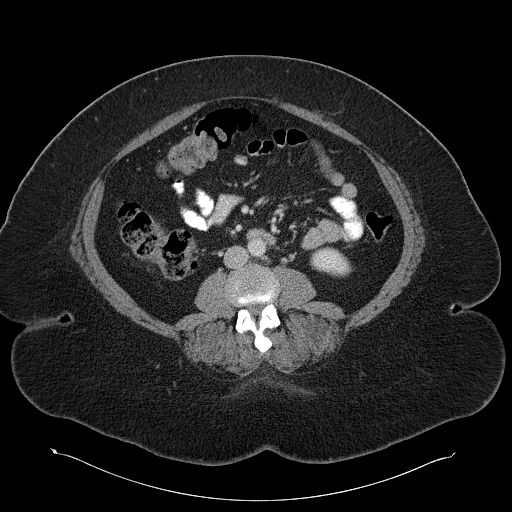
[im 52/90  soft-tissue]
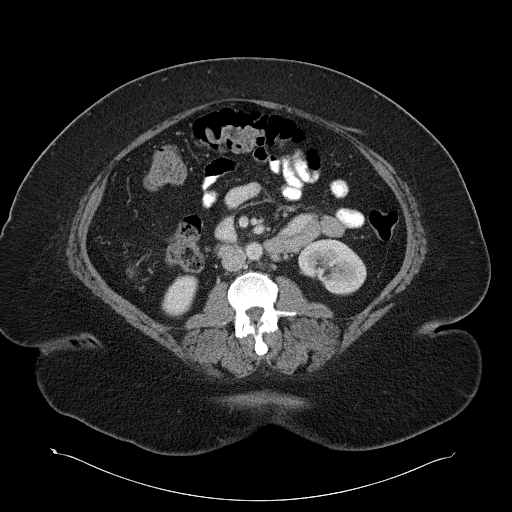
[im 61/90  soft-tissue]
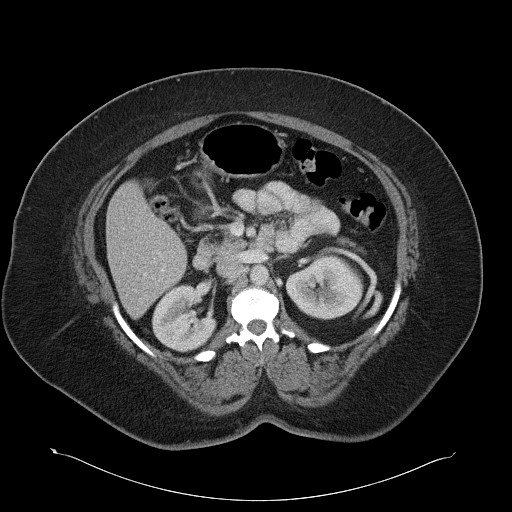
[im 66/90  soft-tissue]
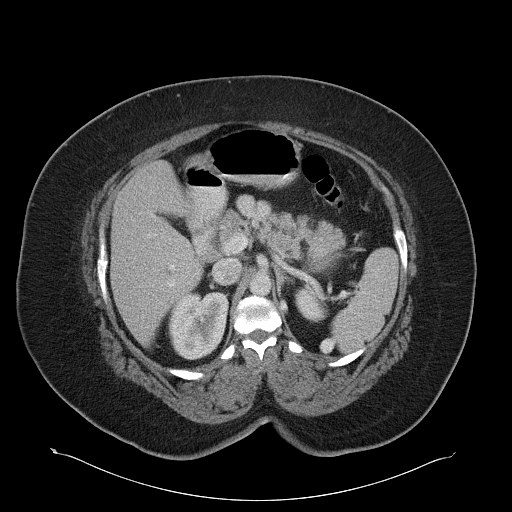
[im 66/90  bone]
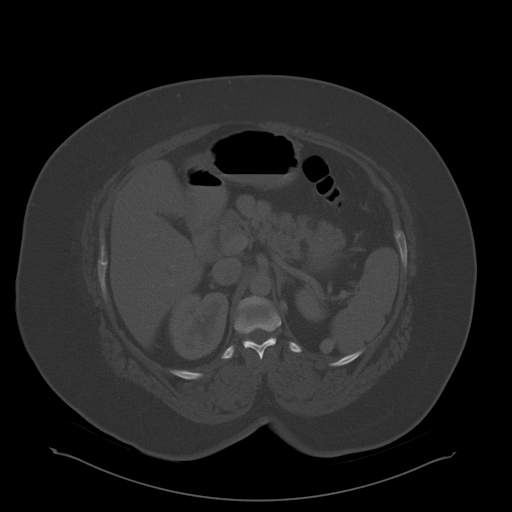
[im 71/90  lung]
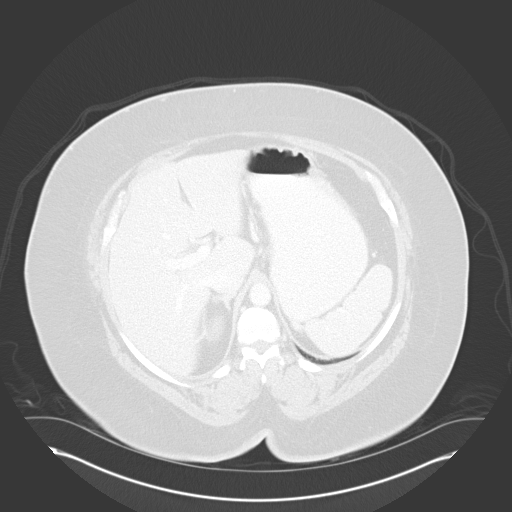
[im 75/90  soft-tissue]
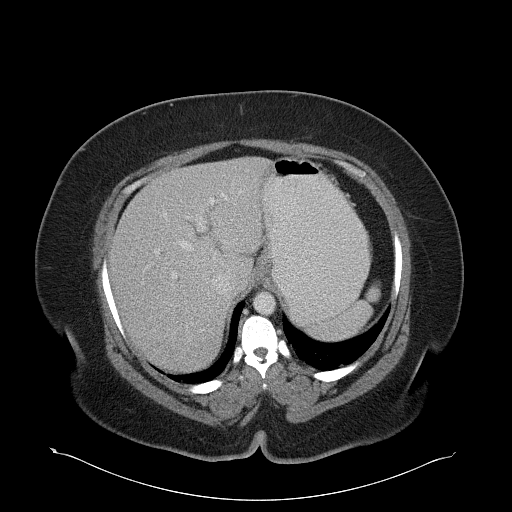
[im 75/90  lung]
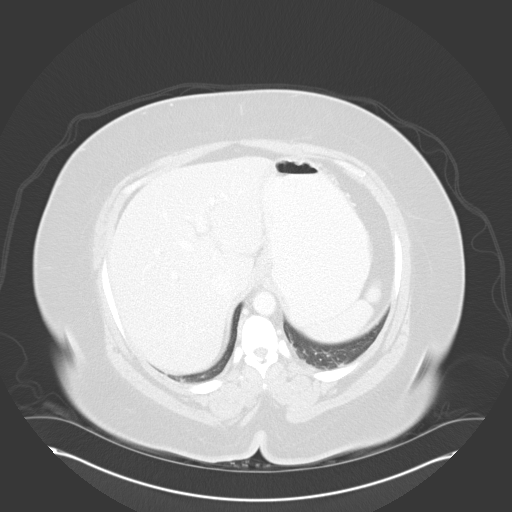
[im 80/90  lung]
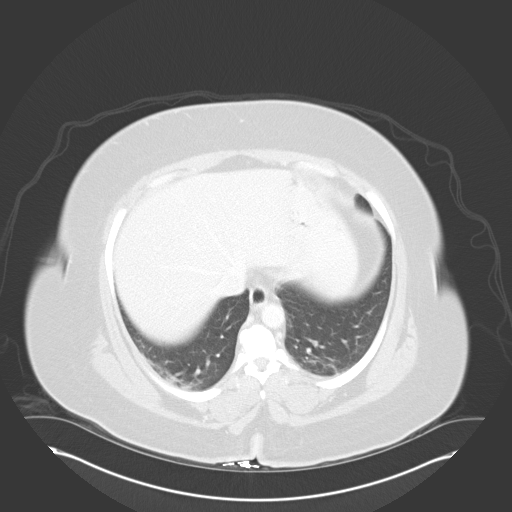
[im 85/90  soft-tissue]
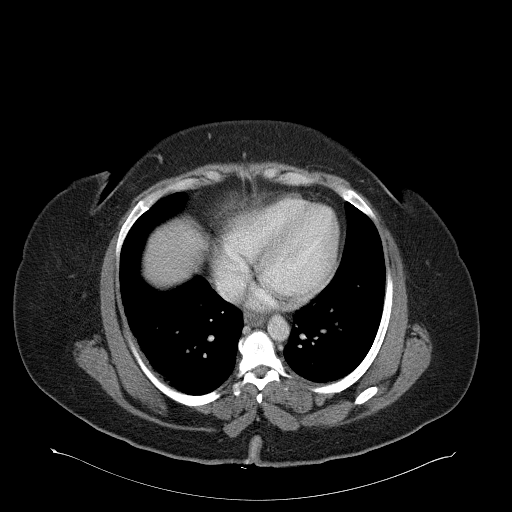
[im 85/90  lung]
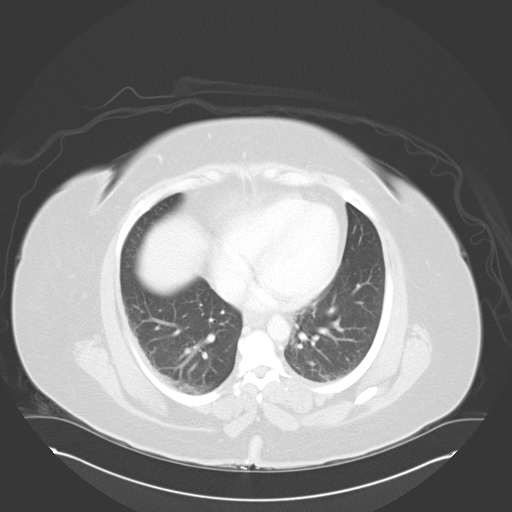

[Series 5: mpr sagittal a/p · sagittal · 0.89mm/px · 1 of 95 slices shown, 2 images]
[im 32/95  soft-tissue]
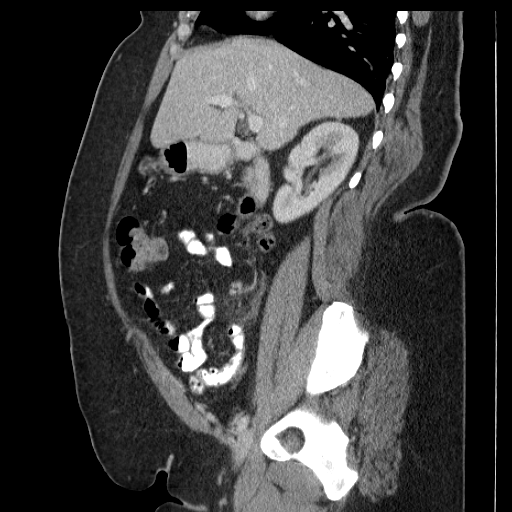
[im 32/95  bone]
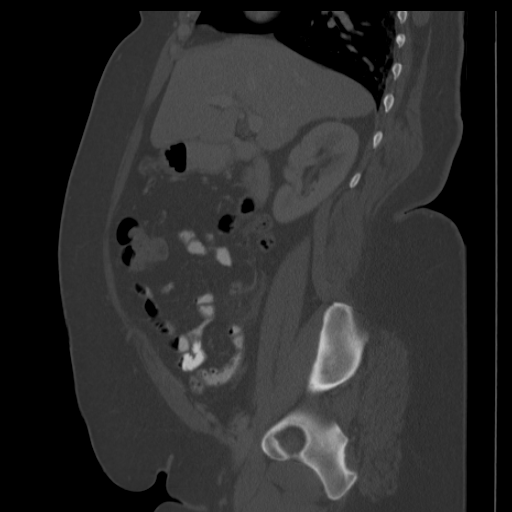

[14 of 36 positions shown; findings below may reference images not displayed]

FINDINGS: Gallbladder has been removed and the bile ducts are nondilated.  There is no mass or adenopathy.  The bowel appears normal in the upper abdomen.  Enlarged veins in the left upper quadrant around the spleen are unchanged and may be due to some varices.
IMPRESSION: No acute abnormality in the upper abdomen. 
 PELVIS CT WITH CONTRAST:
FINDINGS: There is inflammatory-type edema in the right lower quadrant medial to the cecum.  The appendix appears to be thickened up to approximately 12 mm in diameter.  No appendicolith is seen.  There is no free fluid or abscess.  No adenopathy is detected.  The bowel is nondilated.
IMPRESSION: Inflammatory process in the right lower quadrant which is most compatible with acute appendicitis.

## 2006-07-17 ENCOUNTER — Emergency Department (HOSPITAL_COMMUNITY): Admission: EM | Admit: 2006-07-17 | Discharge: 2006-07-17 | Payer: Self-pay | Admitting: Emergency Medicine

## 2006-07-18 ENCOUNTER — Emergency Department (HOSPITAL_COMMUNITY): Admission: EM | Admit: 2006-07-18 | Discharge: 2006-07-18 | Payer: Self-pay | Admitting: Family Medicine

## 2006-07-18 IMAGING — CR DG CHEST 2V
2 series · 2 of 2 positions shown · non-contrast
Comparison: None.

CLINICAL DATA: Cough. Chest pain.
 CHEST - 2 VIEW:

[view not recorded (1 of 2)]
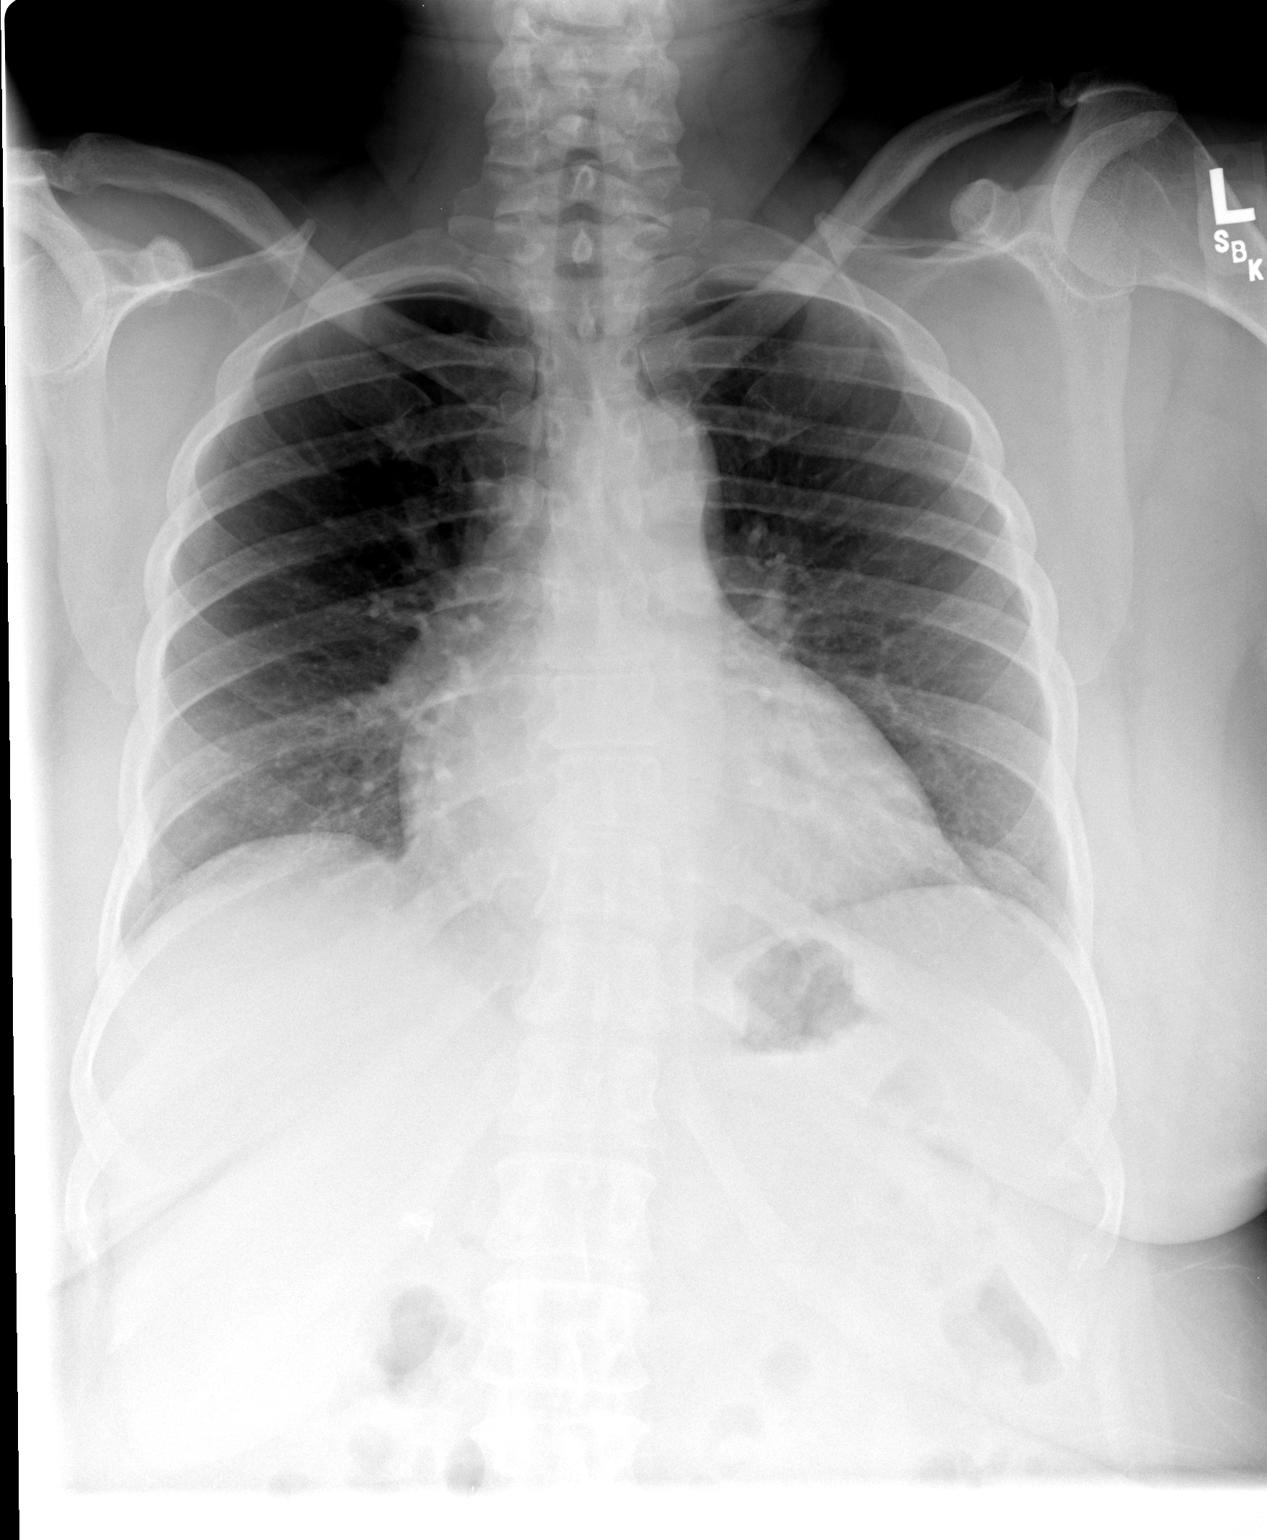

[view not recorded (2 of 2)]
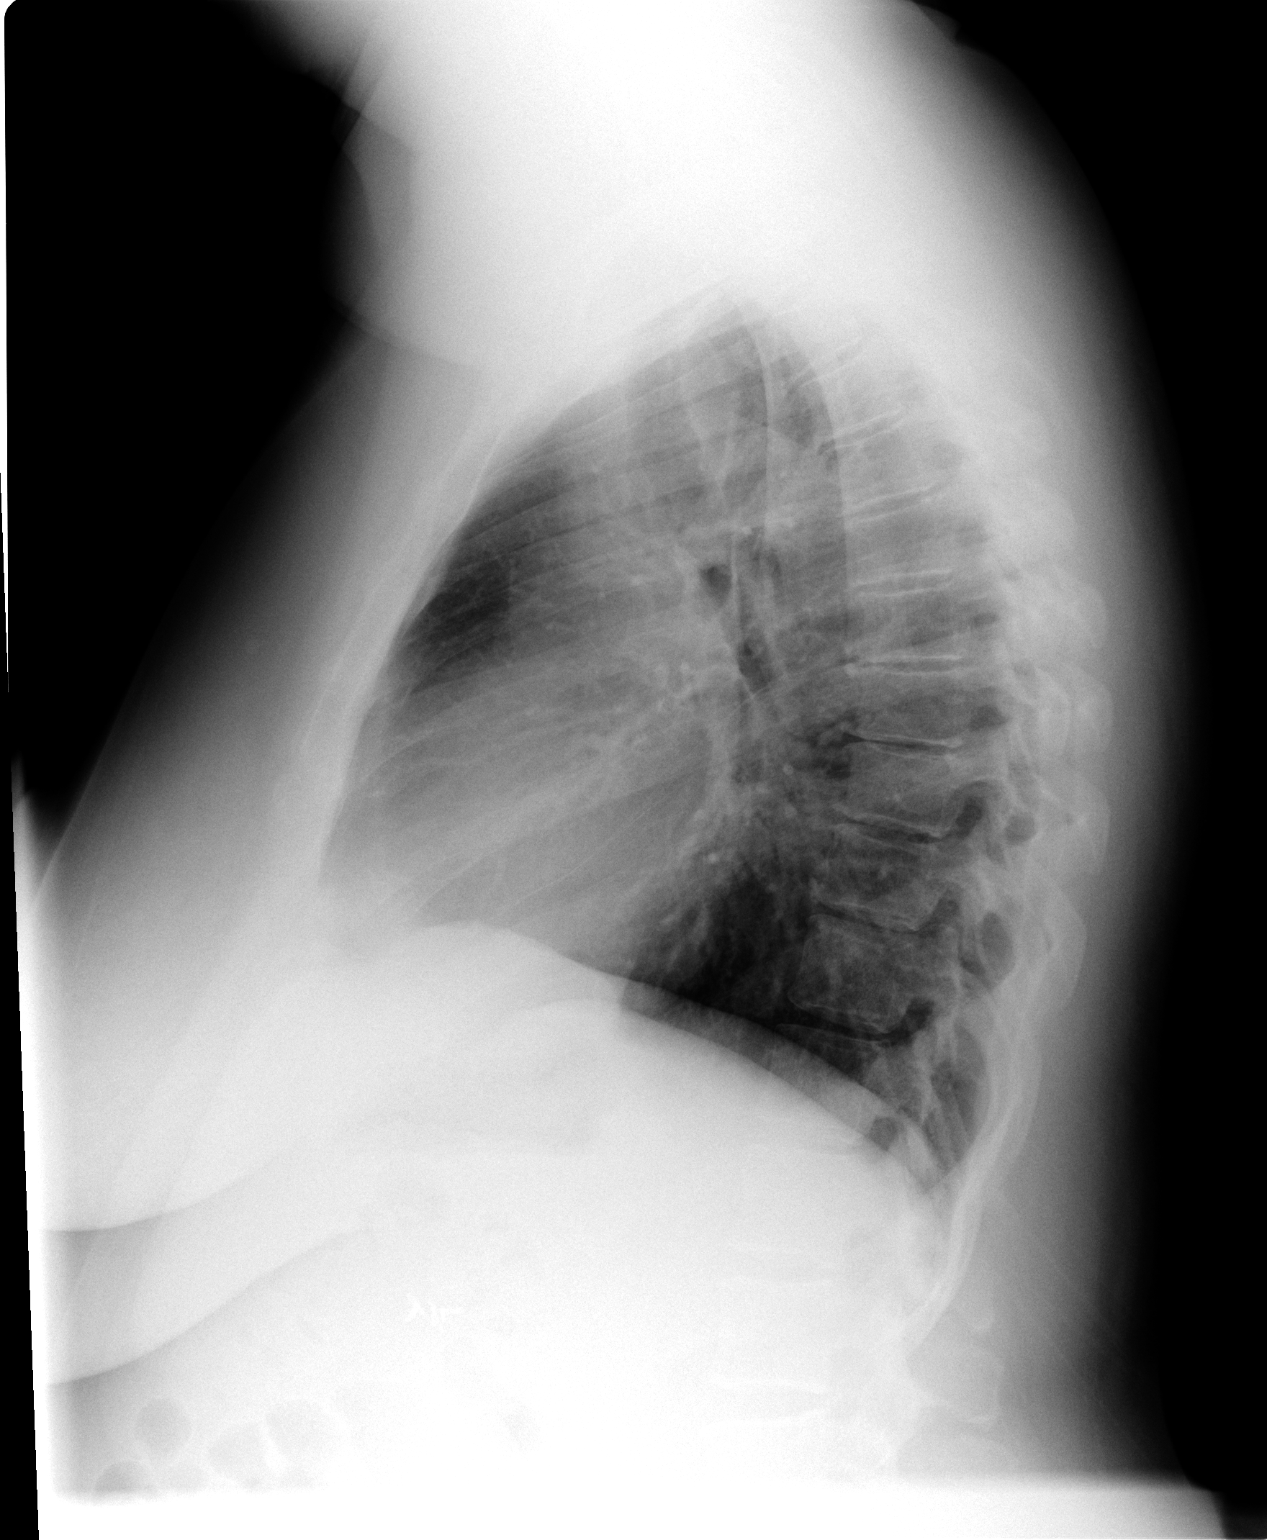

[2 of 2 positions shown; findings below may reference images not displayed]

FINDINGS: The heart size is enlarged.  There are no effusions or edema.  No airspace opacity is identified.
IMPRESSION: 1.  No active disease. 
 2.  Cardiomegaly without failure.

## 2007-11-24 ENCOUNTER — Emergency Department (HOSPITAL_COMMUNITY): Admission: EM | Admit: 2007-11-24 | Discharge: 2007-11-24 | Payer: Self-pay | Admitting: Family Medicine

## 2008-06-04 DIAGNOSIS — C50919 Malignant neoplasm of unspecified site of unspecified female breast: Secondary | ICD-10-CM

## 2008-06-04 HISTORY — DX: Malignant neoplasm of unspecified site of unspecified female breast: C50.919

## 2009-03-12 ENCOUNTER — Emergency Department (HOSPITAL_COMMUNITY): Admission: EM | Admit: 2009-03-12 | Discharge: 2009-03-12 | Payer: Self-pay | Admitting: Family Medicine

## 2010-04-11 ENCOUNTER — Emergency Department (HOSPITAL_COMMUNITY): Admission: EM | Admit: 2010-04-11 | Discharge: 2010-04-11 | Payer: Self-pay | Admitting: Family Medicine

## 2010-10-20 NOTE — Op Note (Signed)
Kelsey Wilson, Kelsey Wilson              ACCOUNT NO.:  [id number]   MEDICAL RECORD NO.:  [id number]          PATIENT TYPE:  OIB   LOCATION:  1516                         FACILITY:  [facility]   PHYSICIAN:  Alfonse Ras, MD   DATE OF BIRTH:  [birth year]   DATE OF PROCEDURE:  03/26/2006  DATE OF DISCHARGE:  03/27/2006                                 OPERATIVE REPORT   PREOPERATIVE DIAGNOSIS:  Acute appendicitis.   POSTOPERATIVE DIAGNOSIS:  Acute appendicitis.   PROCEDURE:  Laparoscopic appendectomy.   ASSISTANT:  Ardeth Sportsman, MD.   DESCRIPTION:  The patient was taken to operating room, placed supine  position.  After adequate general anesthesia was induced using endotracheal  tube, the abdomen was prepped and draped in normal sterile fashion.  Using a  12 mm OptiVu trocar in the left upper quadrant under direct vision,  peritoneal access was obtained.  Pneumoperitoneum was obtained.  There were  significant adhesions of the lower abdomen with omentum.  Area of peritoneum  was identified and a 12 and 5 mm trocars were placed in the left abdomen.  An additional 5 mm trocar was placed in the right upper quadrant.  The  appendix was identified and was very edematous and was difficult to grasp.  The mesoappendix was taken down with harmonic scalpel.  The base of the  appendix was transected using a white load GIA stapling device.  It was  placed in an EndoCatch bag removed through the 12 mm port.  The right lower  quadrant was copiously irrigated with 2 liters saline solution.  Adequate  hemostasis was assured.  Trocars were removed and skin was closed with  staples.  The patient tolerated the procedure well went to PACU in good  condition.      Alfonse Ras, MD  Electronically Signed     KRE/MEDQ  D:  03/26/2006  T:  03/27/2006  Job:  [id number]

## 2010-10-20 NOTE — Discharge Summary (Signed)
[place]  Patient:    Kelsey Wilson                      MRN: [id number] Adm. Date:  [id number] Disc. Date: 04/09/99 Attending:  Dow Adolph Dictator:   Patricia Pesa, M.D.                           Discharge Summary  DISCHARGE DIAGNOSES: 1. Total abdominal hysterectomy with lysis of adhesions, secondary to fibroids. 2. Anemia.  HISTORY OF PRESENT ILLNESS:  On admission, please see full dictation. DD:  04/10/99 TD:  04/10/99 Job: [id number]

## 2010-10-20 NOTE — H&P (Signed)
Kelsey Wilson, Kelsey Wilson              ACCOUNT NO.:  [id number]   MEDICAL RECORD NO.:  [id number]          PATIENT TYPE:  EMS   LOCATION:  ED                           FACILITY:  [facility]   PHYSICIAN:  Kela Millin, M.D.DATE OF BIRTH:  1960-10-09   DATE OF ADMISSION:  03/25/2004  DATE OF DISCHARGE:                                HISTORY & PHYSICAL   PRIMARY CARE PHYSICIAN:  [facility].   CHIEF COMPLAINT:  Lower abdominal pain x2 days.   HISTORY OF PRESENT ILLNESS:  The patient is a 50 year old morbidly obese  black female who presents with complaints of lower abdominal pain x2 days.  She describes the pain as sharp, 8/10 in intensity, and located in the right  lower quadrant with some radiation to her back.  She admits to nausea but no  vomiting.  She denies diarrhea.  She also denies constipation.  She stated  that she had taken Tylenol and Alka-Seltzer Plus but with no relief.   The patient was evaluated in the emergency room and a CT scan of the abdomen  was abnormal.  She is admitted to the [facility] hospitalists service for IV pain  management as well as IV antibiotics.  She admits to subjective fevers, but  denies dysuria, melena, hematemesis, hematochezia, cough, or chest pains.   PAST MEDICAL HISTORY:  1.  Anemia.  2.  Hemorrhoids.   PAST SURGICAL HISTORY:  1.  Status post hysterectomy in 2000.  2.  Status post c-sections x3.   MEDICATIONS:  Advil as needed.   SOCIAL HISTORY:  She denies alcohol, tobacco, and also denies elicit drug  use.   FAMILY HISTORY:  Her mother had hypertension.  Her aunt has hypertension and  diabetes.  Her grandmother had diabetes.  Her other aunt had an MI in her  29s.   REVIEW OF SYSTEMS:  As per HPI.  Other comprehensive review of systems  negative.   PHYSICAL EXAMINATION:  GENERAL:  The patient is a morbidly obese middle-aged  black female in no acute distress.  VITAL SIGNS:  Temperature 98.2, blood pressure 134/90, pulse 91,  respiratory  rate 16, O2 saturation 100%.  HEENT:  PERRL, EOMI.  Sclerae anicteric.  Slightly dry mucus membranes.  No  oral exudates.  NECK:  Supple.  No adenopathy, no JVD, and no thyromegaly.  LUNGS:  Clear to auscultation bilaterally.  No crackles or wheezes.  CARDIOVASCULAR:  Normal S1 and S2.  Regular rate and rhythm.  No murmurs  appreciated.  ABDOMEN:  Soft.  Bowel sounds present.  Nontender and nondistended.  No  organomegaly appreciated.  No masses palpable.  EXTREMITIES:  No cyanosis, clubbing, or edema.  NEUROLOGIC:  Alert and oriented x3.  Cranial nerves II-XII grossly intact.  Nonfocal exam.   LABORATORY DATA:  The CT scan of the abdomen, no renal calculi and no  hydronephrosis.  There is mild sigmoid diverticulitis.  No abscess.  Probable 4 cm right adnexal cyst with peripheral calcification.  The white  cell count is 8.4, hemoglobin is 13.3, hematocrit 40.4, platelet count 227.  The neutrophil count is 78%.  Her sodium is 138, potassium 3.9, chloride  105, CO2 is 27, glucose is 100, BUN 12, creatinine 0.8.  Her LFTs were  within normal limits.   ASSESSMENT/PLAN:  1.  Sigmoid diverticulitis.  We will admit for a 23 hour observation.  IV      antibiotics of Cipro and Flagyl.  IV fluids, IV analgesics, and      antiemetics.  We will obtain a UA and cultures and follow.  2.  Adnexal cyst.  We will obtain a pelvic ultrasound for follow up as      recommended.  3.  Morbid obesity.  4.  History of anemia.  H&H stable.      ACV/MEDQ  D:  03/25/2004  T:  03/25/2004  Job:  [id number]   cc:   Maurice March, M.D.  [address] [place]  [place] [address]  Fax: [phone]

## 2010-10-20 NOTE — Op Note (Signed)
[place]  Patient:    Kelsey Wilson                      MRN: [id number] Proc. Date: 07/10/99 Adm. Date:  [id number] Attending:  Gustavus Messing CC:         Yaakov Guthrie. Shon Hough, M.D. (2)                           Operative Report  INDICATIONS:  This is a 50 year old lady who is status post a hysterectomy several weeks ago.  Has been unable to heal a portion of her incision in the lower part of her abdomen between the umbilicus and the suprapubic hair.  The patient received conservative treatment from her OB/GYN doctor and referred her to me for more intensive treatment, including dressing changes, silver nitrate treatments, etc, with no avail.  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  SURGEON:  Yaakov Guthrie. Shon Hough, M.D.  ANESTHESIA:  Sedation with MAC anesthesia, 0.5% Xylocaine with epinephrine 1:100,000 concentration, a total of 50 cc.  DESCRIPTION OF PROCEDURE:  It was explained to the patient preoperatively.  The  patient understands and consents to surgery.  The patient is taken to the operating room and placed on the operating room table in the supine position.  She was given adequate MAC anesthesia and local.  A prep was done to the abdomen using Betadine soap and solution, and walled off with sterile towels and drapes, so as to make a sterile field.  The marker pen was used to outline the excision of the area, and a longitudinal excision of an eclipse.  This was done with a #15 blade down to the underlying subcutaneous tissue. Using a Bovie unit we were able to remove it down to deep tissue along the prefascial areas.  Hemostasis was  maintained with the Bovie unit on coagulation.  There was no surprises, pus, or other foreign body reactions in the tissue.  The fissure was  removed.  A subcutaneous closure was done with #3-0 monocryl x 2 layers, and a running subcuticular stitch of #3-0 monocryl.  Half inch Steri-Strips and soft dressings were applied to all areas.  She withstood the procedure very well and was taken to the recovery room in good condition.  The estimated blood loss was less than 20 cc.  Complications: None. DD:  07/10/99 TD:  07/10/99 Job: [id number]

## 2010-10-20 NOTE — H&P (Signed)
NAME:  Kelsey Wilson, Kelsey Wilson NO.:  [id number]   MEDICAL RECORD NO.:  [id number]          PATIENT TYPE:  OIB   LOCATION:  1516                         FACILITY:  [facility]   PHYSICIAN:  Alfonse Ras, MD   DATE OF BIRTH:  1960/08/24   DATE OF ADMISSION:  03/26/2006  DATE OF DISCHARGE:                                HISTORY & PHYSICAL   She is a 50 year old black female who presents with an 11-day history of  abdominal pain, nausea and vomiting.  She was evaluated in the emergency  room, and Dr. Bruce Donath asked me to evaluate the patient.  She had some  vague symptoms two to three days ago, but her pain has localized to her  right lower quadrant.  Previously, it was in the periumbilical region.  CT  scan today is consistent with acute appendicitis with no evidence of rupture  or appendicolith.  Patient's pain has been somewhat relieved by pain  medicine here in the emergency room.   PAST MEDICAL HISTORY:  Significant for c-section x3, hysterectomy which  sounds like a wound infection and wound closure by Dr. Shon Hough in 2003,  and patient is unsure of the date, but she underwent laparoscopic  cholecystectomy about 3 or 4 years ago.   MEDICATIONS:  1. Protonix.  2. Pepto-Bismol.  3. Occasional Tylenol.   PHYSICAL EXAMINATION:  VITAL SIGNS:  Patient's temperature can not be  retrieved by me at this time, nor can her vital signs, but by looking at the  monitor, her blood pressure is 118/70 and heart rate is 76.  HEENT:  Appears normal.  Pupils are equal, round, reactive to light.  She  has no icterus.  NECK:  Supple and soft without evidence of thyromegaly.  LUNGS:  Clear to auscultation and percussion x2.  ABDOMEN:  Soft but tender in the right lower quadrant to palpation.  She has  a lower vertical midline incision which is well-healed and has some  contracture.   LABORATORY:  Her white blood cell count is 8,000 without a left shift.  Her  BMET is normal.  Her CT  scan does show a 12-mm dilated appendix with some  fluid in it, consistent with acute appendicitis.   IMPRESSION:  Probable acute appendicitis.   PLAN:  Attempted laparoscopy and possible laparotomy for acute appendicitis.      Alfonse Ras, MD  Electronically Signed     KRE/MEDQ  D:  03/26/2006  T:  03/27/2006  Job:  [id number]

## 2010-10-24 ENCOUNTER — Emergency Department (HOSPITAL_COMMUNITY)
Admission: EM | Admit: 2010-10-24 | Discharge: 2010-10-25 | Disposition: A | Discharge status: Home / Self Care | Attending: Emergency Medicine | Admitting: Emergency Medicine

## 2010-10-24 DIAGNOSIS — R509 Fever, unspecified: Secondary | ICD-10-CM | POA: Insufficient documentation

## 2010-10-24 DIAGNOSIS — IMO0001 Reserved for inherently not codable concepts without codable children: Secondary | ICD-10-CM | POA: Insufficient documentation

## 2010-10-24 DIAGNOSIS — R300 Dysuria: Secondary | ICD-10-CM | POA: Insufficient documentation

## 2010-10-24 DIAGNOSIS — R112 Nausea with vomiting, unspecified: Secondary | ICD-10-CM | POA: Insufficient documentation

## 2010-10-24 DIAGNOSIS — J020 Streptococcal pharyngitis: Secondary | ICD-10-CM | POA: Insufficient documentation

## 2010-10-24 LAB — DIFFERENTIAL
Basophils Absolute: 0 10*3/uL (ref 0.0–0.1)
Basophils Relative: 0 % (ref 0–1)
Eosinophils Absolute: 0 10*3/uL (ref 0.0–0.7)
Eosinophils Relative: 0 % (ref 0–5)
Lymphocytes Relative: 11 % — ABNORMAL LOW (ref 12–46)
Lymphs Abs: 1 10*3/uL (ref 0.7–4.0)
Monocytes Absolute: 0.6 10*3/uL (ref 0.1–1.0)
Monocytes Relative: 7 % (ref 3–12)
Neutro Abs: 7.4 10*3/uL (ref 1.7–7.7)
Neutrophils Relative %: 82 % — ABNORMAL HIGH (ref 43–77)

## 2010-10-24 LAB — URINALYSIS, ROUTINE W REFLEX MICROSCOPIC
Bilirubin Urine: NEGATIVE
Glucose, UA: NEGATIVE mg/dL
Hgb urine dipstick: NEGATIVE
Ketones, ur: NEGATIVE mg/dL
Nitrite: NEGATIVE
Protein, ur: NEGATIVE mg/dL
Specific Gravity, Urine: 1.022 (ref 1.005–1.030)
Urobilinogen, UA: 1 mg/dL (ref 0.0–1.0)
pH: 5.5 (ref 5.0–8.0)

## 2010-10-24 LAB — CBC
HCT: 37.4 % (ref 36.0–46.0)
Hemoglobin: 12.5 g/dL (ref 12.0–15.0)
MCH: 27.8 pg (ref 26.0–34.0)
MCHC: 33.4 g/dL (ref 30.0–36.0)
MCV: 83.3 fL (ref 78.0–100.0)
Platelets: 149 10*3/uL — ABNORMAL LOW (ref 150–400)
RBC: 4.49 MIL/uL (ref 3.87–5.11)
RDW: 13.2 % (ref 11.5–15.5)
WBC: 9.1 10*3/uL (ref 4.0–10.5)

## 2010-10-24 LAB — RAPID STREP SCREEN (MED CTR MEBANE ONLY): Streptococcus, Group A Screen (Direct): POSITIVE — AB

## 2010-10-24 LAB — URINE MICROSCOPIC-ADD ON

## 2010-10-25 LAB — COMPREHENSIVE METABOLIC PANEL
ALT: 13 U/L (ref 0–35)
AST: 18 U/L (ref 0–37)
Albumin: 3.6 g/dL (ref 3.5–5.2)
Alkaline Phosphatase: 54 U/L (ref 39–117)
BUN: 13 mg/dL (ref 6–23)
CO2: 27 mEq/L (ref 19–32)
Calcium: 8.9 mg/dL (ref 8.4–10.5)
Chloride: 98 mEq/L (ref 96–112)
Creatinine, Ser: 0.76 mg/dL (ref 0.4–1.2)
GFR calc Af Amer: >60 mL/min (ref >60)
GFR calc non Af Amer: >60 mL/min (ref >60)
Glucose, Bld: 117 mg/dL — ABNORMAL HIGH (ref 70–99)
Potassium: 3.3 mEq/L — ABNORMAL LOW (ref 3.5–5.1)
Sodium: 134 mEq/L — ABNORMAL LOW (ref 135–145)
Total Bilirubin: 1.1 mg/dL (ref 0.3–1.2)
Total Protein: 7 g/dL (ref 6.0–8.3)

## 2011-05-08 ENCOUNTER — Other Ambulatory Visit: Payer: Self-pay | Admitting: Internal Medicine

## 2011-05-08 DIAGNOSIS — N63 Unspecified lump in unspecified breast: Secondary | ICD-10-CM

## 2011-05-22 ENCOUNTER — Ambulatory Visit
Admission: RE | Admit: 2011-05-22 | Discharge: 2011-05-22 | Disposition: A | Discharge status: Home / Self Care | Source: Ambulatory Visit | Attending: Internal Medicine | Admitting: Internal Medicine

## 2011-05-22 ENCOUNTER — Other Ambulatory Visit: Payer: Self-pay | Admitting: Internal Medicine

## 2011-05-22 DIAGNOSIS — N63 Unspecified lump in unspecified breast: Secondary | ICD-10-CM

## 2011-05-22 IMAGING — MG MM DIGITAL DIAGNOSTIC BILAT
8 of 10 series · 8 of 10 positions shown · non-contrast
Comparison: 05/19/2004.

CLINICAL DATA: The patient notes palpable mass within the upper-
outer quadrant left breast.

DIGITAL DIAGNOSTIC bilateral MAMMOGRAM with CAD AND BILATERAL
BREAST ULTRASOUND:

[R CC (1 of 2)]
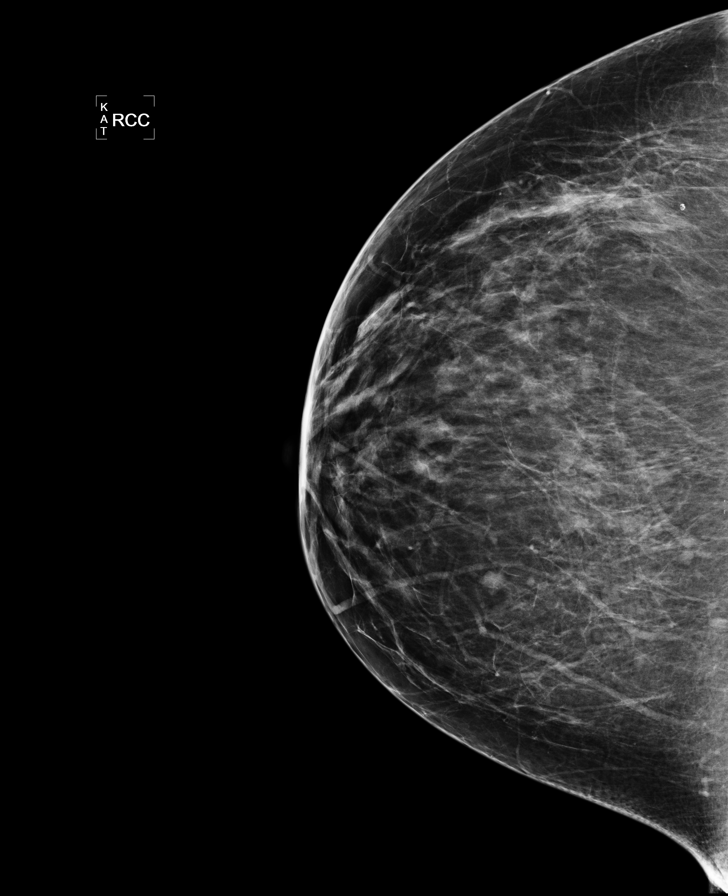

[L CC (1 of 2)]
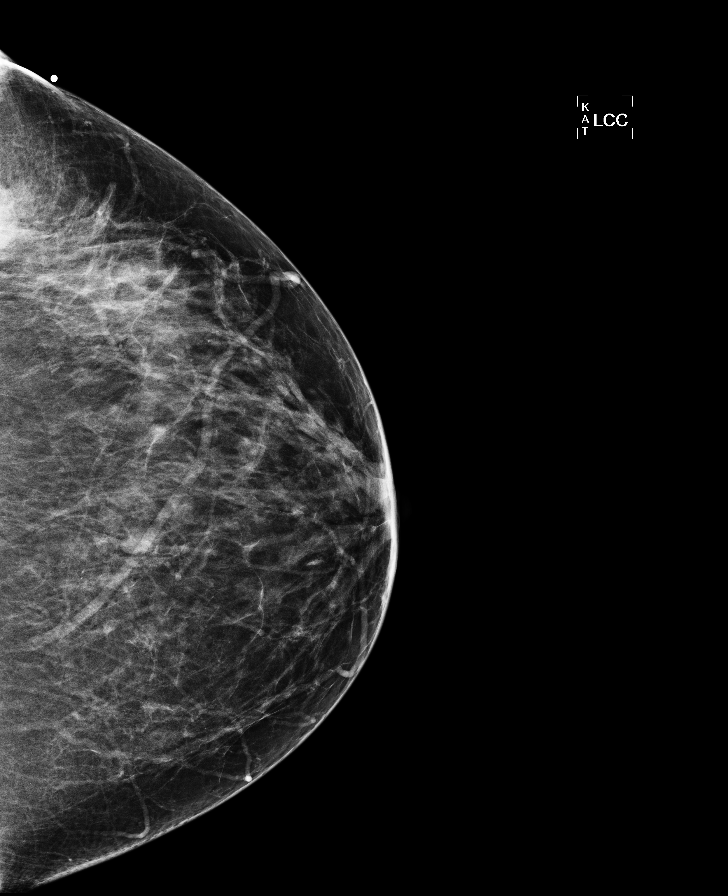

[L MLO (1 of 2)]
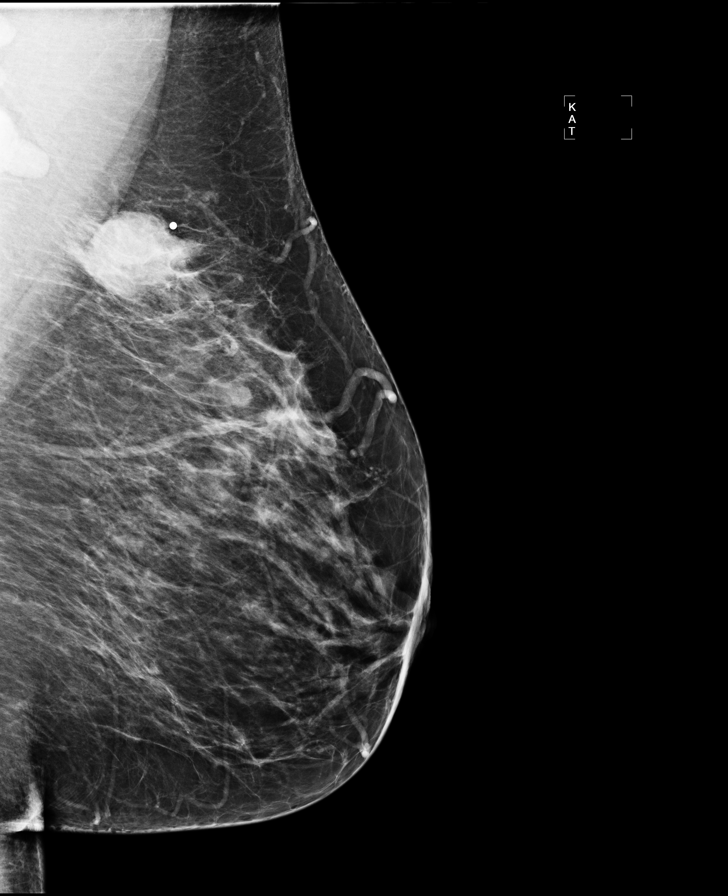

[R MLO]
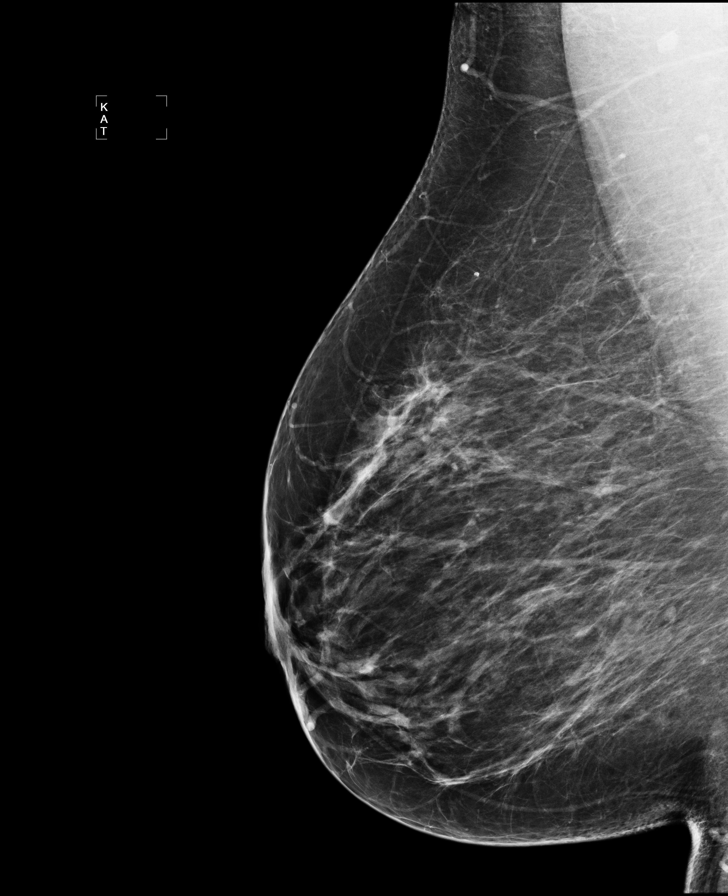

[L TAN]
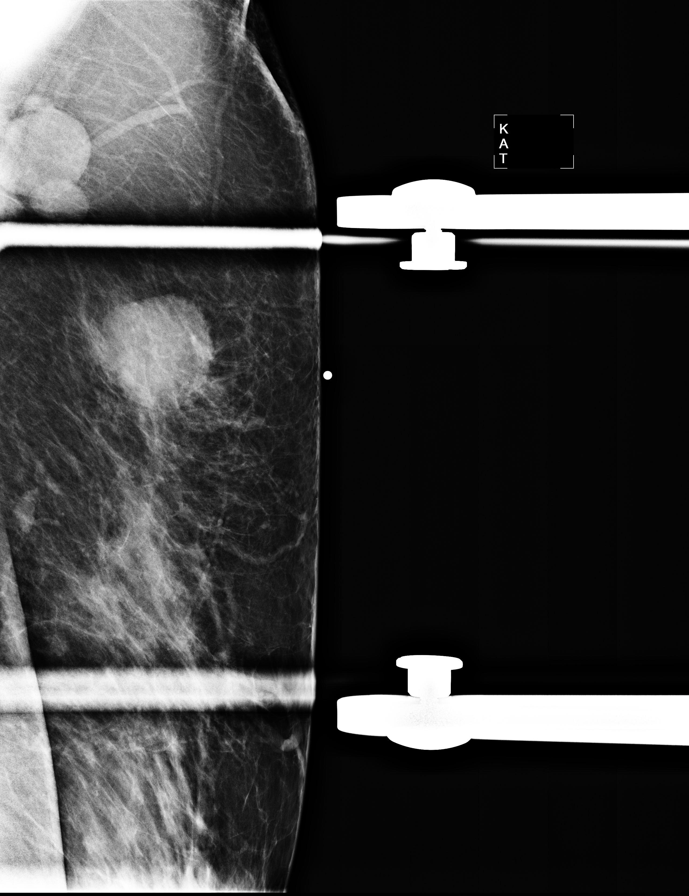

[L CC (2 of 2)]
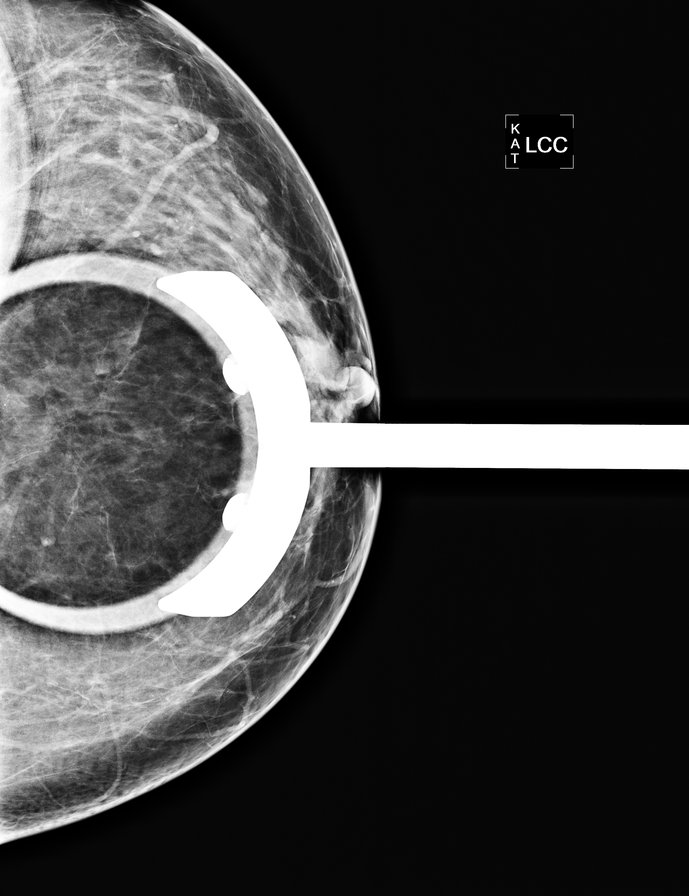

[L MLO (2 of 2)]
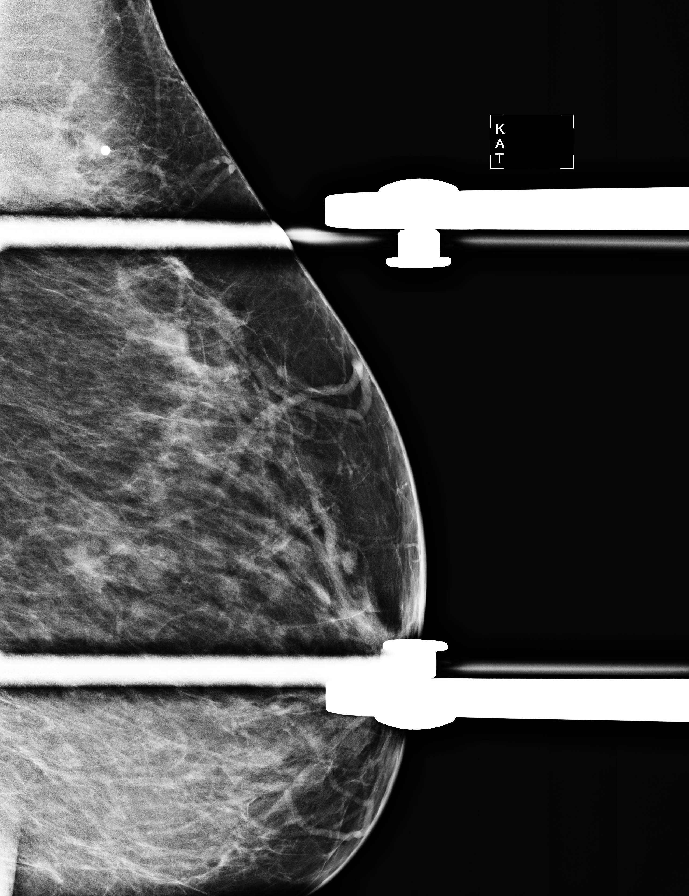

[R CC (2 of 2)]
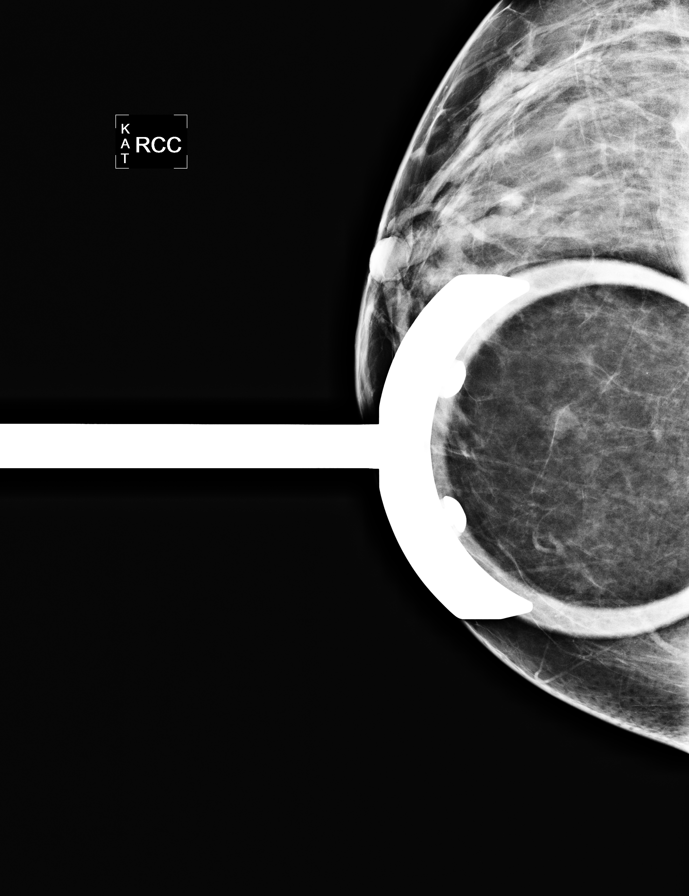

[8 of 10 positions shown; findings below may reference images not displayed]

FINDINGS: There is a scattered fibroglandular pattern present.
There is an irregular mass located within the upper-outer quadrant
left breast at the 2 o'clock position.   Also, there are enlarged
left axillary lymph nodes.  Also seen is an oval circumscribed mass
located within the left breast at the 12 o'clock position which by
mammography measures 12 mm in size.  There is also an oval
circumscribed mass located within the upper inner quadrant of the
right breast measuring 8 mm in size by mammography.  There is no
distortion or worrisome calcification.
Mammographic images were processed with CAD.

On physical exam, there is a firm, mobile palpable mass located
within the upper-outer quadrant left breast at the 2 o'clock
position 12 cm from the nipple which by physical examination
measures approximately 3 cm in size.  There is also palpable left
axillary adenopathy.

Ultrasound is performed, showing an irregular hypoechoic mass
located within the upper-outer quadrant of the left breast at the 2
o'clock position 12 cm from nipple.  By ultrasound this measures
2.9 cm in size.  In addition, there are enlarged left axillary
lymph nodes present with loss of the normal fatty hilar structure.
The largest lymph node measures 2.4 cm in size.  I have discussed
ultrasound-guided core biopsy of this mass and the enlarged left
axillary lymph nodes with the patient.  This will be scheduled per
patient preference.

Ultrasound of the left breast at the 12 o'clock position
demonstrates an oval, circumscribed, mass with central septations
located at the 12 o'clock position 4 cm from nipple measuring 9 x 8
x 4 mm in size.  Most likely this represents an incidental
fibroadenoma.

Ultrasound of the upper inner quadrant of the right breast
demonstrates a 5 mm simple cyst located 2 o'clock position 6 cm
from the nipple.
IMPRESSION: 1.  2.9 cm irregular mass within the upper-outer quadrant of the
left breast worrisome for a primary invasive mammary carcinoma with
enlarged left axillary lymph nodes.  Tissue sampling is recommended
and ultrasound-guided core biopsy of this mass and the left
axillary lymph nodes will be scheduled per patient preference.
2.  9 mm probably benign mass located within the left breast at 12
o'clock position 4 cm from nipple (probable fibroadenoma).
3.  5 mm simple cyst located within the right breast at the 2
o'clock position 6 cm from nipple.

BI-RADS CATEGORY 5:  Highly suggestive of malignancy - appropriate
action should be taken.

## 2011-05-31 ENCOUNTER — Other Ambulatory Visit: Payer: Self-pay | Admitting: Internal Medicine

## 2011-05-31 DIAGNOSIS — N63 Unspecified lump in unspecified breast: Secondary | ICD-10-CM

## 2011-06-01 ENCOUNTER — Ambulatory Visit
Admission: RE | Admit: 2011-06-01 | Discharge: 2011-06-01 | Disposition: A | Discharge status: Home / Self Care | Source: Ambulatory Visit | Attending: Internal Medicine | Admitting: Internal Medicine

## 2011-06-01 ENCOUNTER — Other Ambulatory Visit: Payer: Self-pay | Admitting: Internal Medicine

## 2011-06-01 DIAGNOSIS — N63 Unspecified lump in unspecified breast: Secondary | ICD-10-CM

## 2011-06-01 DIAGNOSIS — N6320 Unspecified lump in the left breast, unspecified quadrant: Secondary | ICD-10-CM

## 2011-06-04 ENCOUNTER — Ambulatory Visit
Admission: RE | Admit: 2011-06-04 | Discharge: 2011-06-04 | Disposition: A | Discharge status: Home / Self Care | Source: Ambulatory Visit | Attending: Internal Medicine | Admitting: Internal Medicine

## 2011-06-04 ENCOUNTER — Other Ambulatory Visit: Payer: Self-pay | Admitting: Internal Medicine

## 2011-06-04 DIAGNOSIS — C50919 Malignant neoplasm of unspecified site of unspecified female breast: Secondary | ICD-10-CM

## 2011-06-04 DIAGNOSIS — N6320 Unspecified lump in the left breast, unspecified quadrant: Secondary | ICD-10-CM

## 2011-06-05 HISTORY — PX: BREAST SURGERY: SHX581

## 2011-06-07 ENCOUNTER — Other Ambulatory Visit: Payer: Self-pay | Admitting: *Deleted

## 2011-06-07 ENCOUNTER — Telehealth: Payer: Self-pay | Admitting: *Deleted

## 2011-06-07 DIAGNOSIS — C50419 Malignant neoplasm of upper-outer quadrant of unspecified female breast: Secondary | ICD-10-CM

## 2011-06-07 NOTE — Telephone Encounter (Signed)
Confirmed [facility] for 06/13/11 at 0815 .  Instructions and contact information given.  

## 2011-06-08 ENCOUNTER — Ambulatory Visit
Admission: RE | Admit: 2011-06-08 | Discharge: 2011-06-08 | Disposition: A | Discharge status: Home / Self Care | Source: Ambulatory Visit | Attending: Internal Medicine | Admitting: Internal Medicine

## 2011-06-08 DIAGNOSIS — C50919 Malignant neoplasm of unspecified site of unspecified female breast: Secondary | ICD-10-CM

## 2011-06-08 IMAGING — MR MR BREAST BILATERAL W WO CONTRAST
8 of 13 series · 31 of 48 positions shown · IV contrast (multihance)
Comparison: Bilateral mammograms 05/22/2011, post-biopsy clip
mammogram of the left breast 06/01/2011.

CLINICAL DATA: Recent diagnosis of invasive ductal carcinoma of the
left breast  and metastatic left axillary lymphadenopathy,
following ultrasound-guided core needle biopsies.

BILATERAL BREAST MRI WITH AND WITHOUT CONTRAST
TECHNIQUE: Multiplanar, multisequence MR images of both breasts
were obtained prior to and following the intravenous administration
of 20ml of Multihance.  Three dimensional images were evaluated at
the independent DynaCad workstation.

[Series 2: T2 · axial · 3.0mm · 0.99mm/px · z∈[-74,+103]mm · 3 of 60 slices shown]
[im 1/60]
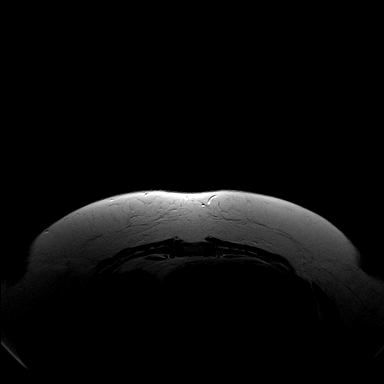
[im 30/60]
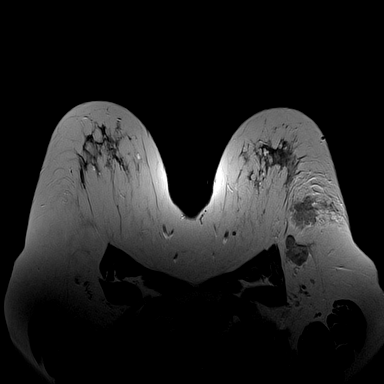
[im 60/60]
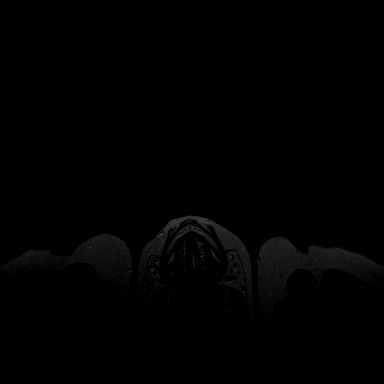

[Series 3: t2_tirm_tra ipat (a-p) · axial · 3.0mm · 0.74mm/px · z∈[-74,+103]mm · 2 of 60 slices shown]
[im 1/60]
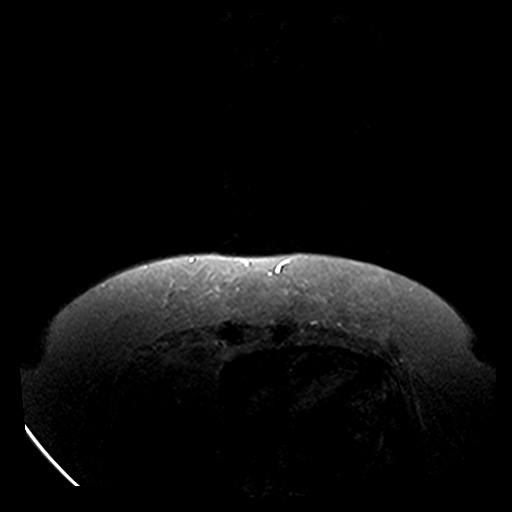
[im 60/60]
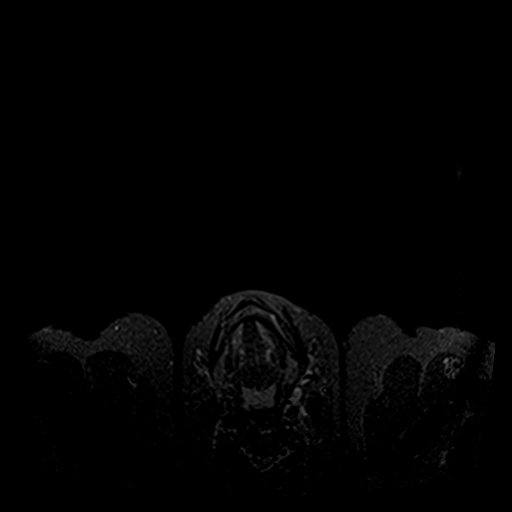

[Series 4: fl3d pre-cm no · axial · non-contrast · 1.2mm · 0.99mm/px · z∈[-71,+100]mm · 5 of 144 slices shown]
[im 1/144]
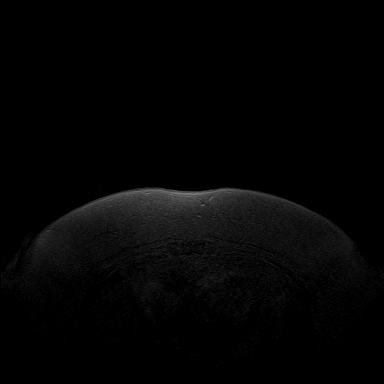
[im 36/144]
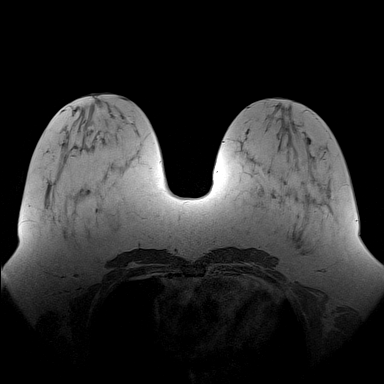
[im 72/144]
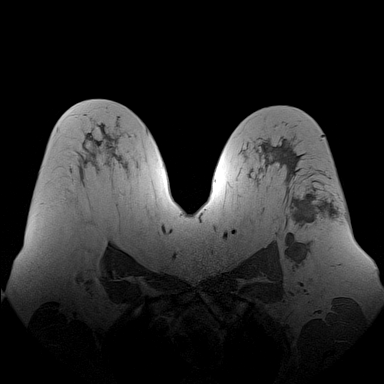
[im 108/144]
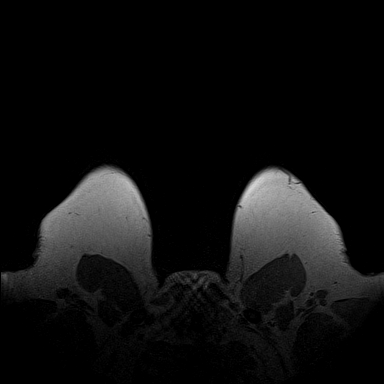
[im 144/144]
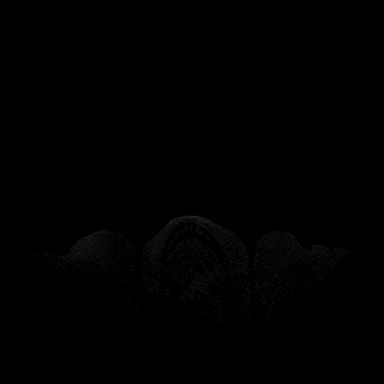

[Series 5: fl3d pre-cm · axial · non-contrast · 1.2mm · 0.99mm/px · z∈[-71,+100]mm · 5 of 144 slices shown]
[im 1/144]
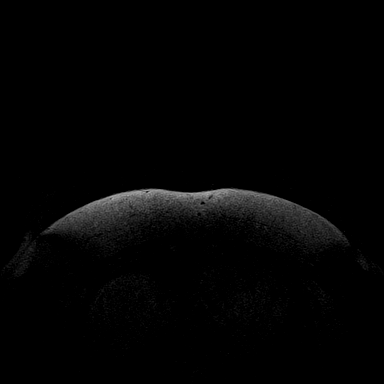
[im 36/144]
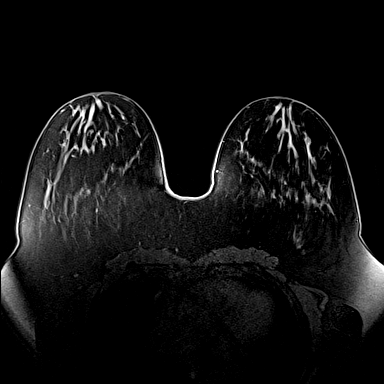
[im 72/144]
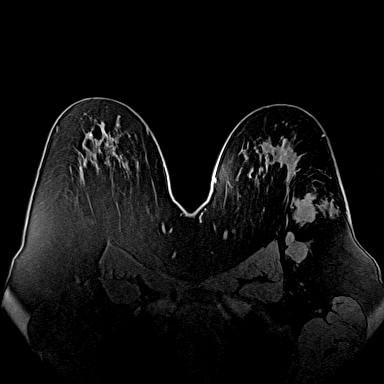
[im 108/144]
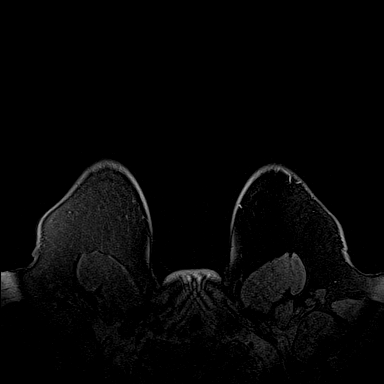
[im 144/144]
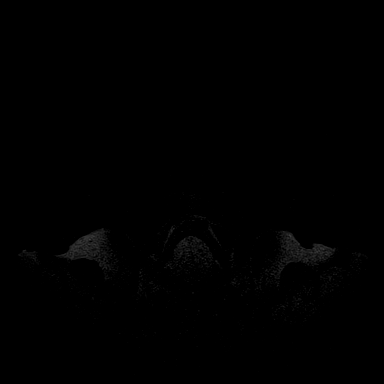

[Series 6: fl3d post-cm 20 · axial · 1.2mm · 0.99mm/px · z∈[-71,+100]mm · 5 of 144 slices shown (1 of 3)]
[im 1/144]
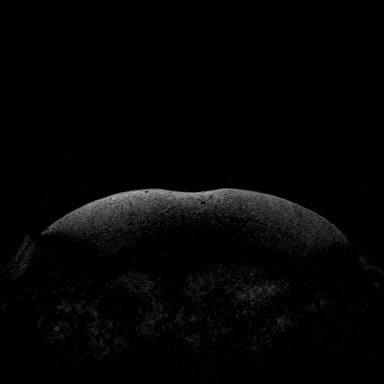
[im 36/144]
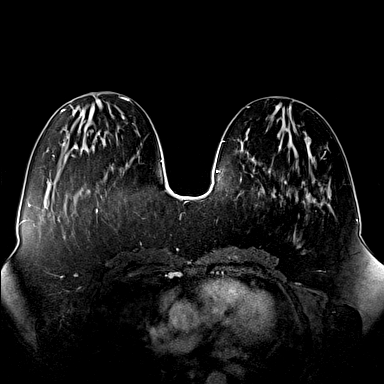
[im 72/144]
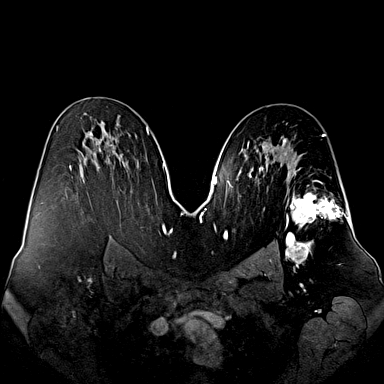
[im 108/144]
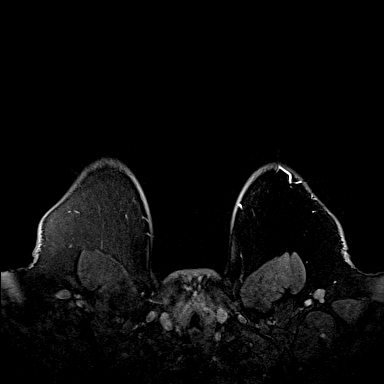
[im 144/144]
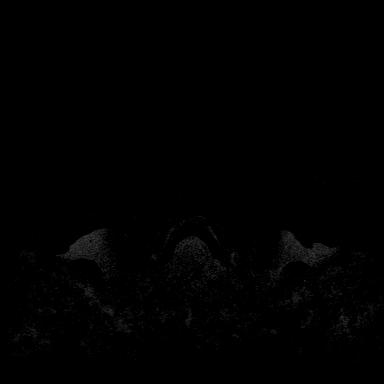

[Series 7: fl3d post-cm 20 · axial · 1.2mm · 0.99mm/px · z∈[-71,+100]mm · 5 of 144 slices shown (2 of 3)]
[im 1/144]
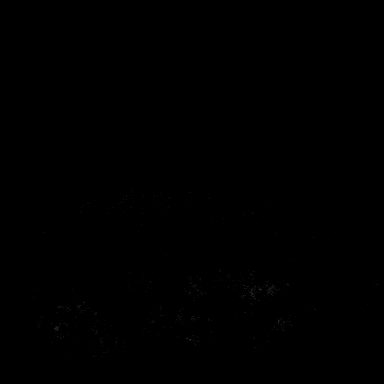
[im 36/144]
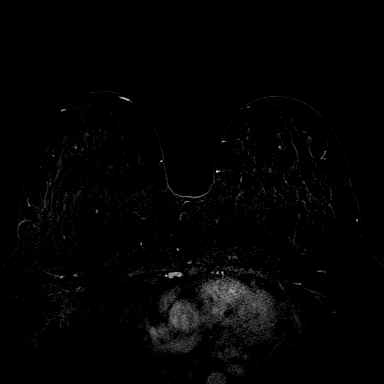
[im 72/144]
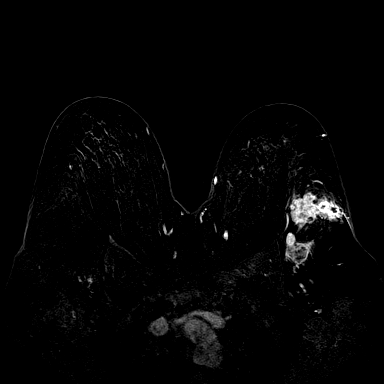
[im 108/144]
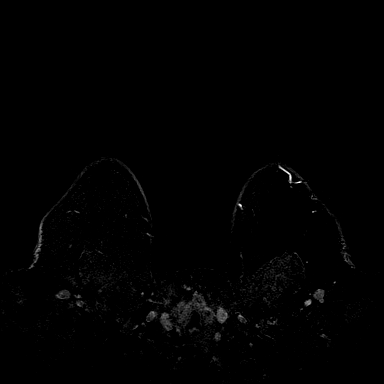
[im 144/144]
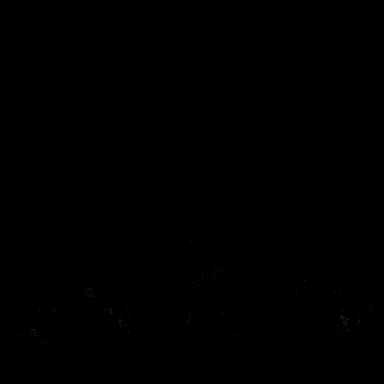

[Series 8: fl3d post-cm 20 · axial · 172.8mm · 0.99mm/px · 1 of 1 slices shown (3 of 3)]
[im 1/1]
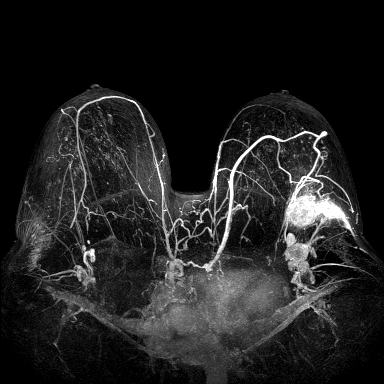

[Series 9: fl3d post-cm 3min · axial · 1.2mm · 0.99mm/px · z∈[-71,+100]mm · 5 of 144 slices shown]
[im 1/144]
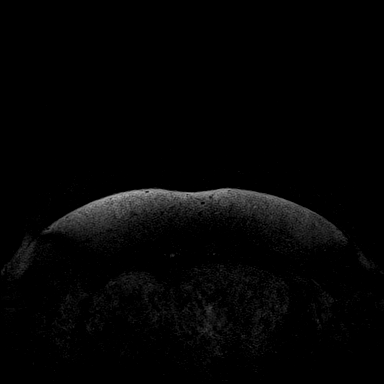
[im 36/144]
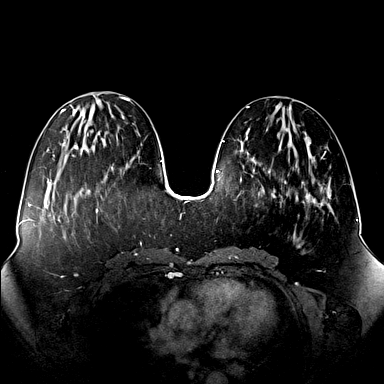
[im 72/144]
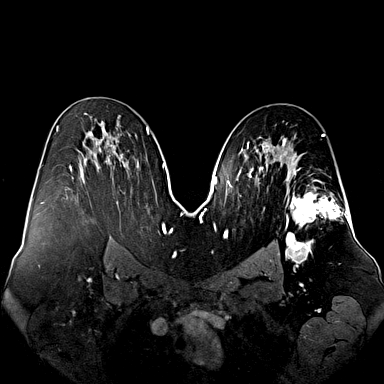
[im 108/144]
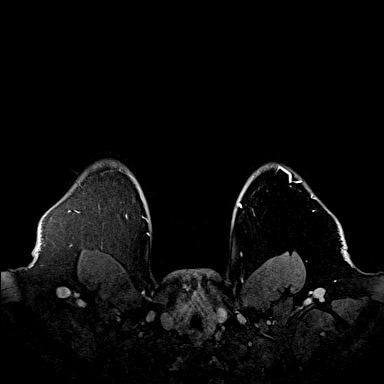
[im 144/144]
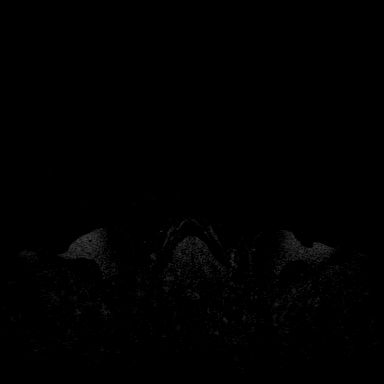

[31 of 48 positions shown; findings below may reference images not displayed]

FINDINGS: There is an irregular mass with heterogeneous enhancement
and washout kinetics in the deep upper outer quadrant of the left
breast.  Abnormal enhancement extends lateral to the bulky portion
of the mass. The abnormal enhancement measures 2.9 cm x 5.2 x
cm (AP by transverse by craniocaudal). The mass-like area measures
3.1 cm transverse diameter.  There are no additional areas of
suspicious enhancement in the left breast.

Negative for skin thickening or pectoralis muscle involvement.

No mass or suspicious enhancement is identified in the right breast
to suggest malignancy.

Level I left axillary lymphadenopathy is identified.  The largest
lymph node is heterogeneously enhancing, demonstrates loss of the
normal hilum, and measures 2.1 x 1.5 cm.

No internal mammary chain or right axillary lymphadenopathy is
identified.
IMPRESSION: 1.  Biopsy-proven solitary upper outer quadrant left breast
carcinoma.  Area of enhancement measures larger on MRI than on
ultrasound due to abnormal nonmass-like enhancement extending
laterally from the mass (this could be due to tumor and/or biopsy
change), given that the biopsy was performed from a lateral
approach.
2. Pathologic left axillary lymphadenopathy.
3.  No MRI evidence of malignancy in the right breast.

THREE-DIMENSIONAL MR IMAGE RENDERING ON INDEPENDENT WORKSTATION:

Three-dimensional MR images were rendered by post-processing of the
original MR data on an independent workstation.  The three-
dimensional MR images were interpreted, and findings were reported
in the accompanying complete MRI report for this study.

BI-RADS CATEGORY 6:  Known biopsy-proven malignancy - appropriate
action should be taken.

## 2011-06-08 MED ORDER — GADOBENATE DIMEGLUMINE 529 MG/ML IV SOLN
20.0000 mL | Freq: Once | INTRAVENOUS | Status: AC | PRN
  Administered 2011-06-08: 20 mL via INTRAVENOUS

## 2011-06-13 ENCOUNTER — Encounter: Payer: Self-pay | Admitting: *Deleted

## 2011-06-13 ENCOUNTER — Ambulatory Visit

## 2011-06-13 ENCOUNTER — Other Ambulatory Visit: Payer: Self-pay | Admitting: Oncology

## 2011-06-13 ENCOUNTER — Other Ambulatory Visit (INDEPENDENT_AMBULATORY_CARE_PROVIDER_SITE_OTHER): Payer: Self-pay | Admitting: General Surgery

## 2011-06-13 ENCOUNTER — Ambulatory Visit (HOSPITAL_BASED_OUTPATIENT_CLINIC_OR_DEPARTMENT_OTHER): Admitting: Oncology

## 2011-06-13 ENCOUNTER — Telehealth: Payer: Self-pay | Admitting: Oncology

## 2011-06-13 ENCOUNTER — Ambulatory Visit
Admission: RE | Admit: 2011-06-13 | Discharge: 2011-06-13 | Disposition: A | Discharge status: Home / Self Care | Source: Ambulatory Visit | Attending: Radiation Oncology | Admitting: Radiation Oncology

## 2011-06-13 ENCOUNTER — Other Ambulatory Visit (HOSPITAL_BASED_OUTPATIENT_CLINIC_OR_DEPARTMENT_OTHER): Admitting: Lab

## 2011-06-13 ENCOUNTER — Ambulatory Visit: Attending: General Surgery | Admitting: Physical Therapy

## 2011-06-13 ENCOUNTER — Ambulatory Visit (HOSPITAL_BASED_OUTPATIENT_CLINIC_OR_DEPARTMENT_OTHER): Admitting: General Surgery

## 2011-06-13 ENCOUNTER — Encounter: Payer: Self-pay | Admitting: Specialist

## 2011-06-13 VITALS — BP 147/91 | HR 71 | Temp 98.2°F | Resp 20 | Ht 63.0 in | Wt 244.0 lb

## 2011-06-13 VITALS — BP 147/91 | HR 71 | Temp 98.2°F | Ht 63.0 in | Wt 244.1 lb

## 2011-06-13 DIAGNOSIS — R293 Abnormal posture: Secondary | ICD-10-CM | POA: Insufficient documentation

## 2011-06-13 DIAGNOSIS — Z01818 Encounter for other preprocedural examination: Secondary | ICD-10-CM | POA: Insufficient documentation

## 2011-06-13 DIAGNOSIS — IMO0001 Reserved for inherently not codable concepts without codable children: Secondary | ICD-10-CM | POA: Insufficient documentation

## 2011-06-13 DIAGNOSIS — Z170 Estrogen receptor positive status [ER+]: Secondary | ICD-10-CM

## 2011-06-13 DIAGNOSIS — C50419 Malignant neoplasm of upper-outer quadrant of unspecified female breast: Secondary | ICD-10-CM

## 2011-06-13 DIAGNOSIS — C50919 Malignant neoplasm of unspecified site of unspecified female breast: Secondary | ICD-10-CM | POA: Insufficient documentation

## 2011-06-13 DIAGNOSIS — M7989 Other specified soft tissue disorders: Secondary | ICD-10-CM | POA: Insufficient documentation

## 2011-06-13 DIAGNOSIS — M25619 Stiffness of unspecified shoulder, not elsewhere classified: Secondary | ICD-10-CM | POA: Insufficient documentation

## 2011-06-13 LAB — COMPREHENSIVE METABOLIC PANEL
ALT: 17 U/L (ref 0–35)
AST: 21 U/L (ref 0–37)
Albumin: 3.8 g/dL (ref 3.5–5.2)
Alkaline Phosphatase: 50 U/L (ref 39–117)
BUN: 14 mg/dL (ref 6–23)
CO2: 27 mEq/L (ref 19–32)
Calcium: 9.4 mg/dL (ref 8.4–10.5)
Chloride: 104 mEq/L (ref 96–112)
Creatinine, Ser: 0.7 mg/dL (ref 0.50–1.10)
Glucose, Bld: 99 mg/dL (ref 70–99)
Potassium: 3.7 mEq/L (ref 3.5–5.3)
Sodium: 139 mEq/L (ref 135–145)
Total Bilirubin: 0.6 mg/dL (ref 0.3–1.2)
Total Protein: 7.1 g/dL (ref 6.0–8.3)

## 2011-06-13 LAB — CBC WITH DIFFERENTIAL/PLATELET
BASO%: 0.3 % (ref 0.0–2.0)
Basophils Absolute: 0 10*3/uL (ref 0.0–0.1)
EOS%: 2.7 % (ref 0.0–7.0)
Eosinophils Absolute: 0.1 10*3/uL (ref 0.0–0.5)
HCT: 36 % (ref 34.8–46.6)
HGB: 12.1 g/dL (ref 11.6–15.9)
LYMPH%: 52.4 % — ABNORMAL HIGH (ref 14.0–49.7)
MCH: 27.9 pg (ref 25.1–34.0)
MCHC: 33.6 g/dL (ref 31.5–36.0)
MCV: 83.1 fL (ref 79.5–101.0)
MONO#: 0.3 10*3/uL (ref 0.1–0.9)
MONO%: 8.8 % (ref 0.0–14.0)
NEUT#: 1.2 10*3/uL — ABNORMAL LOW (ref 1.5–6.5)
NEUT%: 35.8 % — ABNORMAL LOW (ref 38.4–76.8)
Platelets: 193 10*3/uL (ref 145–400)
RBC: 4.33 10*6/uL (ref 3.70–5.45)
RDW: 13.3 % (ref 11.2–14.5)
WBC: 3.3 10*3/uL — ABNORMAL LOW (ref 3.9–10.3)
lymph#: 1.7 10*3/uL (ref 0.9–3.3)
nRBC: 0 % (ref 0–0)

## 2011-06-13 LAB — CANCER ANTIGEN 27.29: CA 27.29: 41 U/mL — ABNORMAL HIGH (ref 0–39)

## 2011-06-13 NOTE — Progress Notes (Signed)
Subjective:   new diagnosis cancer left breast  Patient ID: Kelsey Wilson, female   DOB: August 30, 1960, 51 y.o.   MRN: [id number]  HPI Patient is a very pleasant 51 year old female seen in the breast multidisciplinary clinic for new diagnosis of left breast cancer. The patient first noticed a lump in the upper outer left breast about mid November. She had been having regular mammograms. She consulted her physician and was referred to the breast center for further workup. Bilateral mammogram was performed which revealed an irregular mass in the upper outer quadrant of the left breast at the 2:00 position measuring approximately 3 cm. A clearly enlarged left axillary lymph node was also seen. 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 Ki-67 was 100%. Subsequent bilateral breast MRI was performed. This reveals abnormal enhancement in the area of the mass measuring 2.9 x 5.2 cm. Again noted was an approximately 3 cm axillary lymph node. No other areas were detected. The patient has not had any previous breast disease or breast biopsies. She is postmenopausal. There is no family history of breast cancer.  Past Medical History  Diagnosis Date  . Night sweats   . Fatigue   . Wears glasses   . SOB (shortness of breath) on exertion     walking and stairts  . Arthritis   . Hot flashes    Past Surgical History  Procedure Date  . Cholecystectomy   . Appendectomy   . Carpal tunnel release     Bilateral  . Tonsillectomy   . Spur     Apex spur on both big toes  . Knee surgery     Left Knee   Current Outpatient Prescriptions  Medication Sig Dispense Refill  . OVER THE COUNTER MEDICATION Pt. Not taking any medications       Allergies  Allergen Reactions  . Penicillins Nausea Only   History  Substance Use Topics  . Smoking status: Never Smoker   .  Smokeless tobacco: Not on file  . Alcohol Use: No    Review of Systems  Constitutional: Positive for fatigue.  HENT: Negative.   Eyes: Negative.   Respiratory: Positive for shortness of breath (mild, with exertion).   Cardiovascular: Negative.   Gastrointestinal: Negative.        Colonoscopy is up-to-date  Genitourinary: Negative.   Musculoskeletal: Positive for myalgias and arthralgias.       Objective:   Physical Exam BP 147/91  Pulse 71  Temp 98.2 F (36.8 C)  Resp 20  Ht 5\' 3"  (1.6 m)  Wt 244 lb (110.678 kg)  BMI 43.22 kg/m2 Body mass index is 43.22 kg/(m^2). General: Alert, obese African American female, in no distress Skin: Warm and dry without rash or infection. HEENT: No palpable masses or thyromegaly. Sclera nonicteric. Pupils equal round and reactive. Oropharynx clear. Breasts: In the upper outer quadrant of the left breast in the tail of Spence area is approximately 3 cm firm freely movable mass. No skin changes. No nipple changes. I cannot definitely feel axillary nodes although she is obese and somewhat difficult to examine. Lymph nodes: No cervical, supraclavicular, or inguinal nodes palpable. Lungs: Breath sounds clear and equal without increased work of breathing Cardiovascular: Regular rate and rhythm without murmur. No JVD or edema. Peripheral pulses intact. Abdomen: Nondistended. Soft and nontender. No masses palpable. No organomegaly. No palpable hernias. Extremities: No  edema or joint swelling or deformity. No chronic venous stasis changes. Neurologic: Alert and fully oriented. Gait normal.     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 We discussed the initial surgical treatment options in detail including breast conservation with lumpectomy versus total mastectomy. She does prefer breast conservation. Her tumor is up to 5 cm by MRI with axillary node involvement and would  benefit from neoadjuvant treatment prior to conservative surgery with lumpectomy and axillary dissection. This was all discussed with the patient including options of mastectomy. She will need a Port-A-Cath for chemotherapy we discussed this in detail including its nature, risks of bleeding, infection, pneumothorax, catheter displacement and malfunction. We will get this in for her as quickly as possible.    Plan:     We will plan to place a Port-A-Cath for neoadjuvant chemotherapy with subsequent plans for likely left breast lumpectomy and axillary dissection following her treatment.

## 2011-06-13 NOTE — Telephone Encounter (Signed)
Gv pt appt for jan2013.  scheduled pt for echo on 01/16 @ [facility], pet scan on 01/21 @ [facility]

## 2011-06-13 NOTE — Progress Notes (Signed)
Met pt and her family at the multi-disciplinary clinic.  Gave her information about support team services, breast cancer support group, and [facility].  At her request, I have made a referral to [facility].  TMP

## 2011-06-13 NOTE — Patient Instructions (Signed)

## 2011-06-14 ENCOUNTER — Encounter (HOSPITAL_BASED_OUTPATIENT_CLINIC_OR_DEPARTMENT_OTHER): Payer: Self-pay | Admitting: *Deleted

## 2011-06-14 NOTE — Progress Notes (Signed)
No labs needed

## 2011-06-14 NOTE — Progress Notes (Signed)
CC:   Kelsey Wilson, M.D., F.R.C.P.C. Kelsey Wilson. Hoxworth, M.D. Kelsey Livings, MD  DIAGNOSIS:  T3 N1 invasive ductal carcinoma left breast.  PREVIOUS INTERVENTIONS:  Biopsy of left breast mass and left axillary lymph node on 06/01/2011 revealing grade 3 invasive ductal carcinoma, ER/PR positive at 8% and 9% respectively, HER2 negative, Ki-67 of 100%.  HISTORY OF PRESENT ILLNESS:  Kelsey Wilson is a 51 year old female who presented with a left palpable breast mass.  It was not associated with any pain or bleeding.  She underwent a mammogram, which showed a mass in the upper-outer quadrant, as well as positive left axillary lymph node. Ultrasound confirmed these findings with the mass measuring 2.9 cm, lymph node measuring 2.4 cm.  MRI confirmed the presence of a 2.9 x 5.2 x 2.5 cm mass as well as the lymph node measuring 2.1 x 1.5 cm.  Her prognostic profile was basically triple negative with weak positivity in ER and PR.  She was referred to clinic today for consideration of management of this disease.  She met Dr. Johna Wilson who referred her to me for my recommendations regarding radiation in the management of her disease.  She has recovered well from her biopsy.  She has very minimal soreness to palpation of her left breast.  PAST HISTORY: 1. Hysterectomy. 2. Status post cholecystectomy. 3. Bursitis. 4. Carpal tunnel syndrome. 5. Nasal surgery.  MEDICATIONS:  None.  ALLERGIES:  Penicillin causes nausea.  FAMILY HISTORY:  There is no family history of  malignancy.  She has 6 siblings and no family history in those people.  SOCIAL HISTORY:  She works as a Administrator, sports.  She is divorced.  She is accompanied by her friend, her daughter and her mother today.  She denies any alcohol or tobacco use.  GYN HISTORY:  Menarche at 52.  She is postmenopausal.  She never used hormone replacement therapy.  She is GX, P3.  First live birth at age 55.  REVIEW OF SYSTEMS:  Positive for night  sweats, insomnia, pain in her legs and feet, muscle aches and cramping, shortness of breath, 4 pillow orthopnea and pain with walking and climbing stairs.  She admits to back pain, arthritis, hot flashes, and blood transfusions.  All other systems reviewed and found to be negative.  PHYSICAL EXAMINATION:  She is an obese female in no distress, sitting comfortably on the exam room table.  Blood pressure 147/91, pulse 71, respirations 20, temperature 98.2.  She has a palpable left breast mass in the upper outer quadrant in the axillary tail.  I am not able to palpate her lymph nodes.  She has no palpable abnormalities of the right breast.  No palpable right axillary adenopathy.  She has no palpable supraclavicular adenopathy.  IMPRESSION:  A 51 year old female with a locally advanced left breast cancer.  RECOMMENDATIONS:  Kelsey. Wilson was seen in Multidisciplinary Clinic today. She has seen Kelsey Wilson and Dr. Johna Wilson.  She is interested in breast conservation and has elected to pursue neoadjuvant chemotherapy.  She will be first staged with a PET and CT scan, which have been ordered. She will then have a port placed and hopefully have a good response to treatment.  As she does elect for breast conservation,  I discussed with her the decrease in local recurrence seen in patients who undergo radiation.  We discussed the process of simulation and placement of tattoos and daily treatments for 6 weeks.  We discussed possible side effects of treatment including, but not  limited to, skin darkening, which can be temporary or permanent, as well as fatigue.  I will meet with her and discuss treatment in greater detail once she is finished with chemotherapy and surgery.  She also met with our physical therapist as well as member of our patient-family support team.    ______________________________ Kelsey Wilson, M.D. SW/MEDQ  D:  06/13/2011  T:  06/14/2011  Job:  [id number]

## 2011-06-15 ENCOUNTER — Encounter: Payer: Self-pay | Admitting: *Deleted

## 2011-06-18 ENCOUNTER — Ambulatory Visit (HOSPITAL_COMMUNITY)

## 2011-06-18 ENCOUNTER — Encounter (HOSPITAL_BASED_OUTPATIENT_CLINIC_OR_DEPARTMENT_OTHER): Payer: Self-pay | Admitting: Anesthesiology

## 2011-06-18 ENCOUNTER — Ambulatory Visit (HOSPITAL_BASED_OUTPATIENT_CLINIC_OR_DEPARTMENT_OTHER): Admitting: Anesthesiology

## 2011-06-18 ENCOUNTER — Other Ambulatory Visit

## 2011-06-18 ENCOUNTER — Encounter (HOSPITAL_BASED_OUTPATIENT_CLINIC_OR_DEPARTMENT_OTHER): Payer: Self-pay | Admitting: *Deleted

## 2011-06-18 ENCOUNTER — Encounter (HOSPITAL_BASED_OUTPATIENT_CLINIC_OR_DEPARTMENT_OTHER)
Admission: RE | Disposition: A | Discharge status: Home / Self Care | Payer: Self-pay | Source: Ambulatory Visit | Attending: General Surgery

## 2011-06-18 ENCOUNTER — Ambulatory Visit (HOSPITAL_BASED_OUTPATIENT_CLINIC_OR_DEPARTMENT_OTHER)
Admission: RE | Admit: 2011-06-18 | Discharge: 2011-06-18 | Disposition: A | Discharge status: Home / Self Care | Source: Ambulatory Visit | Attending: General Surgery | Admitting: General Surgery

## 2011-06-18 DIAGNOSIS — C50919 Malignant neoplasm of unspecified site of unspecified female breast: Secondary | ICD-10-CM | POA: Insufficient documentation

## 2011-06-18 DIAGNOSIS — C50419 Malignant neoplasm of upper-outer quadrant of unspecified female breast: Secondary | ICD-10-CM

## 2011-06-18 DIAGNOSIS — R0602 Shortness of breath: Secondary | ICD-10-CM | POA: Insufficient documentation

## 2011-06-18 HISTORY — PX: PORTACATH PLACEMENT: SHX2246

## 2011-06-18 IMAGING — CR DG CHEST 1V PORT
1 series · 1 of 1 positions shown · non-contrast
Comparison: 07/18/2006

CLINICAL DATA: Port-A-Cath insertion.

PORTABLE CHEST - 1 VIEW

[view not recorded]
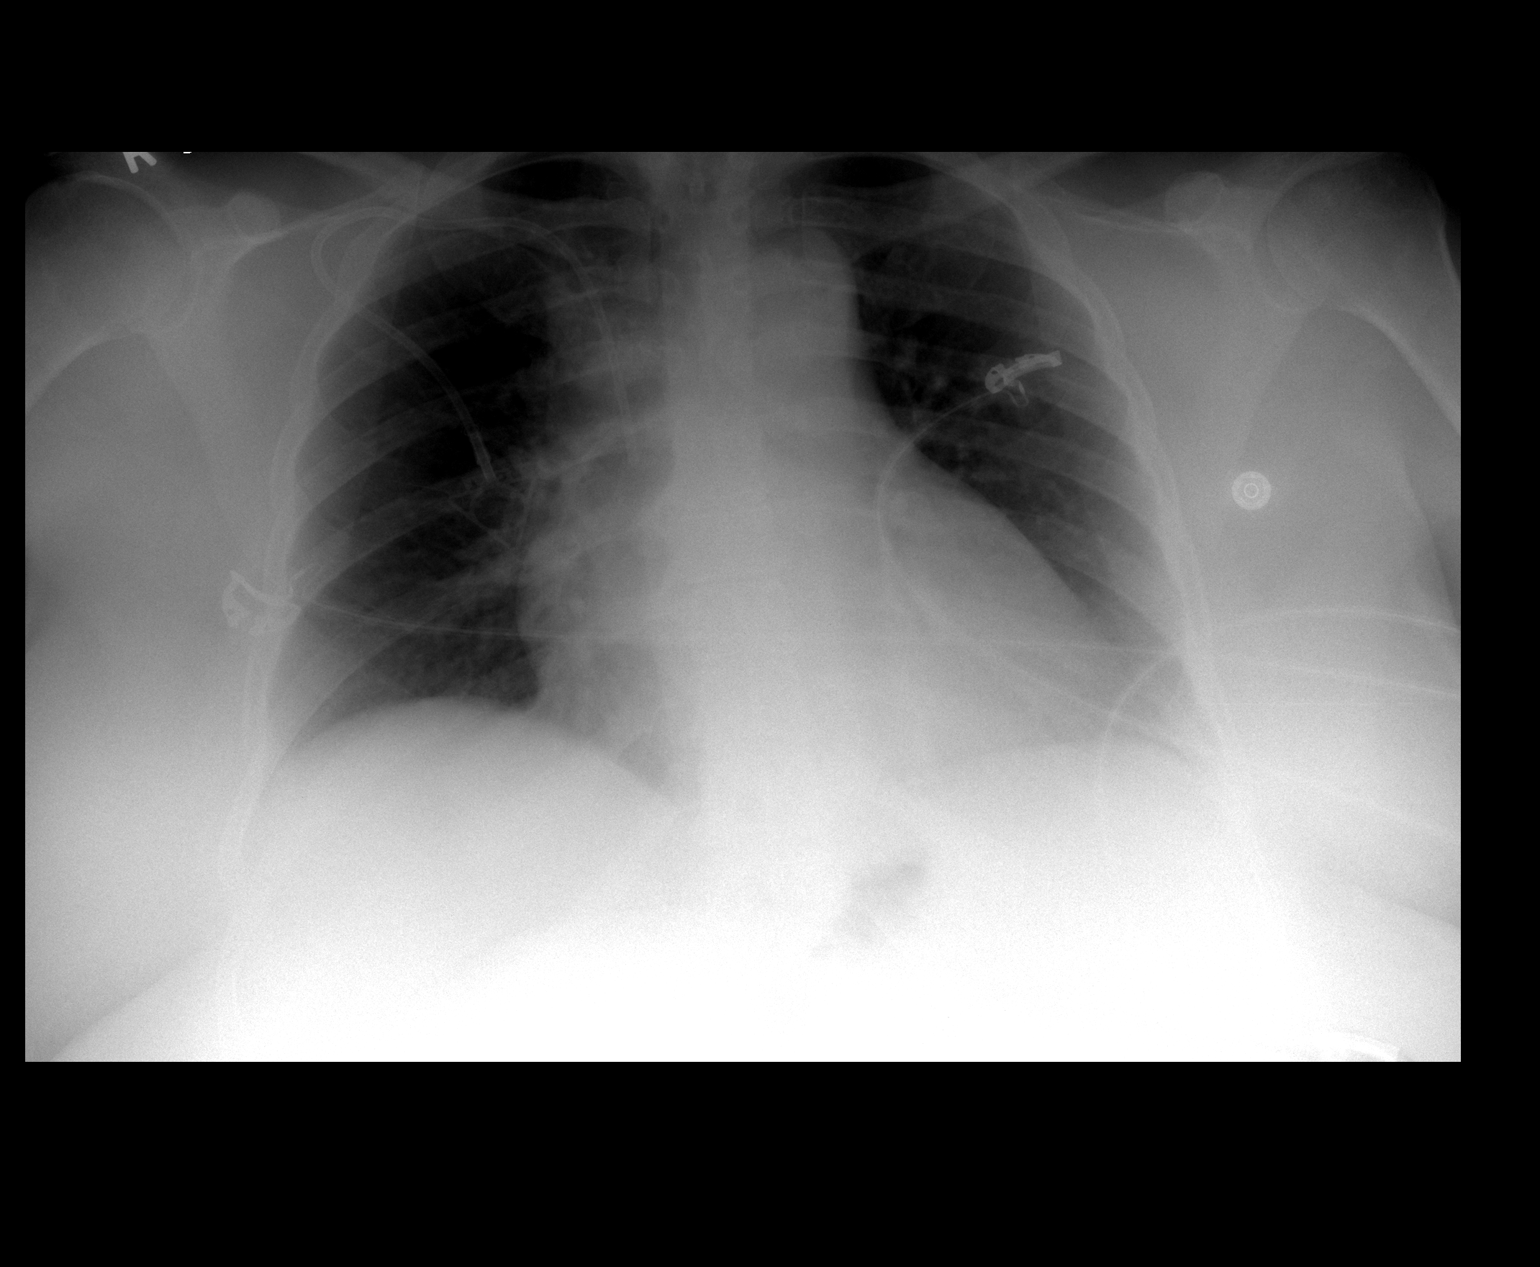

[1 of 1 positions shown; findings below may reference images not displayed]

FINDINGS: Right subclavian Port-A-Cath is in place with the tip in
the lower SVC.  There is mild cardiomegaly with low lung volumes.
No effusions or confluent airspace opacity.
IMPRESSION: Right Port-A-Cath tip in the lower SVC.  No pneumothorax.

Mild cardiomegaly.

## 2011-06-18 SURGERY — INSERTION, TUNNELED CENTRAL VENOUS DEVICE, WITH PORT
Anesthesia: Monitor Anesthesia Care | Site: Chest | Laterality: Right | Wound class: Clean

## 2011-06-18 MED ORDER — METOCLOPRAMIDE HCL 5 MG/ML IJ SOLN: 10.0000 mg | Freq: Once | INTRAMUSCULAR | Status: DC | PRN

## 2011-06-18 MED ORDER — HEPARIN SOD (PORK) LOCK FLUSH 100 UNIT/ML IV SOLN
INTRAVENOUS | Status: DC | PRN
  Administered 2011-06-18: 500 [IU]

## 2011-06-18 MED ORDER — HYDROCODONE-ACETAMINOPHEN 5-325 MG PO TABS: 1.0000 | ORAL_TABLET | ORAL | Status: AC | PRN

## 2011-06-18 MED ORDER — CIPROFLOXACIN IN D5W 400 MG/200ML IV SOLN
400.0000 mg | INTRAVENOUS | Status: AC
  Administered 2011-06-18: 400 mg via INTRAVENOUS

## 2011-06-18 MED ORDER — PROPOFOL 10 MG/ML IV EMUL
INTRAVENOUS | Status: DC | PRN
  Administered 2011-06-18 (×2): 20 mg via INTRAVENOUS
  Administered 2011-06-18: 10 mg via INTRAVENOUS

## 2011-06-18 MED ORDER — HEPARIN (PORCINE) IN NACL 2-0.9 UNIT/ML-% IJ SOLN
INTRAMUSCULAR | Status: DC | PRN
  Administered 2011-06-18: 1 via INTRAVENOUS

## 2011-06-18 MED ORDER — FENTANYL CITRATE 0.05 MG/ML IJ SOLN
INTRAMUSCULAR | Status: DC | PRN
  Administered 2011-06-18 (×2): 50 ug via INTRAVENOUS

## 2011-06-18 MED ORDER — FENTANYL CITRATE 0.05 MG/ML IJ SOLN: 25.0000 ug | INTRAMUSCULAR | Status: DC | PRN

## 2011-06-18 MED ORDER — BUPIVACAINE-EPINEPHRINE 0.25% -1:200000 IJ SOLN
INTRAMUSCULAR | Status: DC | PRN
  Administered 2011-06-18: 30 mL

## 2011-06-18 MED ORDER — MIDAZOLAM HCL 5 MG/5ML IJ SOLN
INTRAMUSCULAR | Status: DC | PRN
  Administered 2011-06-18 (×2): 1 mg via INTRAVENOUS

## 2011-06-18 MED ORDER — ONDANSETRON HCL 4 MG/2ML IJ SOLN
INTRAMUSCULAR | Status: DC | PRN
  Administered 2011-06-18: 4 mg via INTRAVENOUS

## 2011-06-18 MED ORDER — CHLORHEXIDINE GLUCONATE 4 % EX LIQD: 1.0000 "application " | Freq: Once | CUTANEOUS | Status: DC

## 2011-06-18 MED ORDER — LIDOCAINE HCL (PF) 1 % IJ SOLN
INTRAMUSCULAR | Status: DC | PRN
  Administered 2011-06-18: 30 mL

## 2011-06-18 MED ORDER — LACTATED RINGERS IV SOLN
INTRAVENOUS | Status: DC
  Administered 2011-06-18: 07:00:00 via INTRAVENOUS

## 2011-06-18 MED ORDER — SODIUM BICARBONATE 4 % IV SOLN
INTRAVENOUS | Status: DC | PRN
  Administered 2011-06-18: 5 mL via INTRAVENOUS

## 2011-06-18 SURGICAL SUPPLY — 41 items
ADH SKN CLS APL DERMABOND .7 (GAUZE/BANDAGES/DRESSINGS) ×1
BAG DECANTER FOR FLEXI CONT (MISCELLANEOUS) ×2 IMPLANT
BANDAGE ADHESIVE 1X3 (GAUZE/BANDAGES/DRESSINGS) ×2 IMPLANT
BLADE SURG 15 STRL LF DISP TIS (BLADE) ×1 IMPLANT
BLADE SURG 15 STRL SS (BLADE) ×1
CHLORAPREP W/TINT 26ML (MISCELLANEOUS) ×2 IMPLANT
CLOTH BEACON ORANGE TIMEOUT ST (SAFETY) ×2 IMPLANT
COVER MAYO STAND STRL (DRAPES) ×2 IMPLANT
COVER TABLE BACK 60X90 (DRAPES) ×2 IMPLANT
DECANTER SPIKE VIAL GLASS SM (MISCELLANEOUS) ×2 IMPLANT
DERMABOND ADVANCED (GAUZE/BANDAGES/DRESSINGS) ×1
DERMABOND ADVANCED .7 DNX12 (GAUZE/BANDAGES/DRESSINGS) ×1 IMPLANT
DRAPE C-ARM 42X72 X-RAY (DRAPES) ×2 IMPLANT
DRAPE LAPAROTOMY T 102X78X121 (DRAPES) ×2 IMPLANT
DRAPE UTILITY XL STRL (DRAPES) ×2 IMPLANT
ELECT REM PT RETURN 9FT ADLT (ELECTROSURGICAL) ×2
ELECTRODE REM PT RTRN 9FT ADLT (ELECTROSURGICAL) ×1 IMPLANT
GLOVE BIOGEL PI IND STRL 8 (GLOVE) ×1 IMPLANT
GLOVE BIOGEL PI INDICATOR 8 (GLOVE) ×1
GLOVE SS BIOGEL STRL SZ 7.5 (GLOVE) ×1 IMPLANT
GLOVE SUPERSENSE BIOGEL SZ 7.5 (GLOVE) ×1
GOWN PREVENTION PLUS XLARGE (GOWN DISPOSABLE) ×2 IMPLANT
IV CATH PLACEMENT UNIT 16 GA (IV SOLUTION) IMPLANT
IV HEPARIN 1000UNITS/500ML (IV SOLUTION) ×2 IMPLANT
KIT POWER CATH 8FR (Catheter) ×2 IMPLANT
NEEDLE HYPO 25X1 1.5 SAFETY (NEEDLE) ×2 IMPLANT
PACK BASIN DAY SURGERY FS (CUSTOM PROCEDURE TRAY) ×2 IMPLANT
PENCIL BUTTON HOLSTER BLD 10FT (ELECTRODE) ×2 IMPLANT
SET SHEATH INTRODUCER 10FR (MISCELLANEOUS) IMPLANT
SHEATH COOK PEEL AWAY SET 9F (SHEATH) IMPLANT
SLEEVE SCD COMPRESS KNEE MED (MISCELLANEOUS) IMPLANT
SPONGE GAUZE 4X4 12PLY (GAUZE/BANDAGES/DRESSINGS) ×2 IMPLANT
SUT MON AB 4-0 PC3 18 (SUTURE) ×2 IMPLANT
SUT PROLENE 2 0 CT2 30 (SUTURE) ×2 IMPLANT
SUT SILK 4 0 TIES 17X18 (SUTURE) IMPLANT
SYR 5ML LUER SLIP (SYRINGE) ×2 IMPLANT
SYR CONTROL 10ML LL (SYRINGE) ×2 IMPLANT
TAPE CLOTH SURG 4X10 WHT LF (GAUZE/BANDAGES/DRESSINGS) ×2 IMPLANT
TOWEL OR 17X24 6PK STRL BLUE (TOWEL DISPOSABLE) ×4 IMPLANT
TOWEL OR NON WOVEN STRL DISP B (DISPOSABLE) ×2 IMPLANT
WATER STERILE IRR 1000ML POUR (IV SOLUTION) ×2 IMPLANT

## 2011-06-18 NOTE — Anesthesia Preprocedure Evaluation (Signed)
Anesthesia Evaluation  Patient identified by MRN, date of birth, ID band Patient awake    Reviewed: Allergy & Precautions, H&P , NPO status , Patient's Chart, lab work & pertinent test results, reviewed documented beta blocker date and time   Airway Mallampati: II TM Distance: >3 FB Neck ROM: full    Dental   Pulmonary shortness of breath and with exertion,          Cardiovascular neg cardio ROS     Neuro/Psych Negative Neurological ROS  Negative Psych ROS   GI/Hepatic negative GI ROS, Neg liver ROS,   Endo/Other  Negative Endocrine ROS  Renal/GU negative Renal ROS  Genitourinary negative   Musculoskeletal   Abdominal   Peds  Hematology negative hematology ROS (+)   Anesthesia Other Findings See surgeon's H&P   Reproductive/Obstetrics negative OB ROS                           Anesthesia Physical Anesthesia Plan  ASA: III  Anesthesia Plan: MAC   Post-op Pain Management:    Induction: Intravenous  Airway Management Planned: Natural Airway  Additional Equipment:   Intra-op Plan:   Post-operative Plan:   Informed Consent: I have reviewed the patients History and Physical, chart, labs and discussed the procedure including the risks, benefits and alternatives for the proposed anesthesia with the patient or authorized representative who has indicated his/her understanding and acceptance.     Plan Discussed with: CRNA and Surgeon  Anesthesia Plan Comments:         Anesthesia Quick Evaluation

## 2011-06-18 NOTE — Interval H&P Note (Signed)
History and Physical Interval Note:  06/18/2011 7:35 AM  Kelsey Wilson  has presented today for surgery, with the diagnosis of breast cancer  The various methods of treatment have been discussed with the patient and family. After consideration of risks, benefits and other options for treatment, the patient has consented to  Procedure(s): INSERTION PORT-A-CATH as a surgical intervention .  The patients' history has been reviewed, patient examined, no change in status, stable for surgery.  I have reviewed the patients' chart and labs.  Questions were answered to the patient's satisfaction.     Wei Poplaski T

## 2011-06-18 NOTE — Transfer of Care (Signed)
Immediate Anesthesia Transfer of Care Note  Patient: Kelsey Wilson  Procedure(s) Performed:  INSERTION PORT-A-CATH - right subclavian  Patient Location: PACU  Anesthesia Type: MAC  Level of Consciousness: awake, alert  and oriented  Airway & Oxygen Therapy: Patient Spontanous Breathing  Post-op Assessment: Report given to PACU RN and Post -op Vital signs reviewed and stable  Post vital signs: Reviewed and stable Filed Vitals:   06/18/11 0629  BP: 127/81  Pulse: 70  Temp: 36.6 C    Complications: No apparent anesthesia complications

## 2011-06-18 NOTE — Anesthesia Procedure Notes (Signed)
Procedure Name: MAC Date/Time: 06/18/2011 7:43 AM Performed by: Zenia Resides D Pre-anesthesia Checklist: Patient identified, Timeout performed, Emergency Drugs available, Suction available and Patient being monitored Patient Re-evaluated:Patient Re-evaluated prior to inductionOxygen Delivery Method: Simple face mask

## 2011-06-18 NOTE — H&P (View-Only) (Signed)
Subjective:   new diagnosis cancer left breast  Patient ID: Kelsey Wilson, female   DOB: 12/06/1960, 50 y.o.   MRN: [id number]  HPI Patient is a very pleasant 50-year-old female seen in the breast multidisciplinary clinic for new diagnosis of left breast cancer. The patient first noticed a lump in the upper outer left breast about mid November. She had been having regular mammograms. She consulted her physician and was referred to the breast center for further workup. Bilateral mammogram was performed which revealed an irregular mass in the upper outer quadrant of the left breast at the 2:00 position measuring approximately 3 cm. A clearly enlarged left axillary lymph node was also seen. 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 Ki-67 was 100%. Subsequent bilateral breast MRI was performed. This reveals abnormal enhancement in the area of the mass measuring 2.9 x 5.2 cm. Again noted was an approximately 3 cm axillary lymph node. No other areas were detected. The patient has not had any previous breast disease or breast biopsies. She is postmenopausal. There is no family history of breast cancer.  Past Medical History  Diagnosis Date  . Night sweats   . Fatigue   . Wears glasses   . SOB (shortness of breath) on exertion     walking and stairts  . Arthritis   . Hot flashes    Past Surgical History  Procedure Date  . Cholecystectomy   . Appendectomy   . Carpal tunnel release     Bilateral  . Tonsillectomy   . Spur     Apex spur on both big toes  . Knee surgery     Left Knee   Current Outpatient Prescriptions  Medication Sig Dispense Refill  . OVER THE COUNTER MEDICATION Pt. Not taking any medications       Allergies  Allergen Reactions  . Penicillins Nausea Only   History  Substance Use Topics  . Smoking status: Never Smoker   .  Smokeless tobacco: Not on file  . Alcohol Use: No    Review of Systems  Constitutional: Positive for fatigue.  HENT: Negative.   Eyes: Negative.   Respiratory: Positive for shortness of breath (mild, with exertion).   Cardiovascular: Negative.   Gastrointestinal: Negative.        Colonoscopy is up-to-date  Genitourinary: Negative.   Musculoskeletal: Positive for myalgias and arthralgias.       Objective:   Physical Exam BP 147/91  Pulse 71  Temp 98.2 F (36.8 C)  Resp 20  Ht 5' 3" (1.6 m)  Wt 244 lb (110.678 kg)  BMI 43.22 kg/m2 Body mass index is 43.22 kg/(m^2). General: Alert, obese African American female, in no distress Skin: Warm and dry without rash or infection. HEENT: No palpable masses or thyromegaly. Sclera nonicteric. Pupils equal round and reactive. Oropharynx clear. Breasts: In the upper outer quadrant of the left breast in the tail of Spence area is approximately 3 cm firm freely movable mass. No skin changes. No nipple changes. I cannot definitely feel axillary nodes although she is obese and somewhat difficult to examine. Lymph nodes: No cervical, supraclavicular, or inguinal nodes palpable. Lungs: Breath sounds clear and equal without increased work of breathing Cardiovascular: Regular rate and rhythm without murmur. No JVD or edema. Peripheral pulses intact. Abdomen: Nondistended. Soft and nontender. No masses palpable. No organomegaly. No palpable hernias. Extremities: No   edema or joint swelling or deformity. No chronic venous stasis changes. Neurologic: Alert and fully oriented. Gait normal.     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 We discussed the initial surgical treatment options in detail including breast conservation with lumpectomy versus total mastectomy. She does prefer breast conservation. Her tumor is up to 5 cm by MRI with axillary node involvement and would  benefit from neoadjuvant treatment prior to conservative surgery with lumpectomy and axillary dissection. This was all discussed with the patient including options of mastectomy. She will need a Port-A-Cath for chemotherapy we discussed this in detail including its nature, risks of bleeding, infection, pneumothorax, catheter displacement and malfunction. We will get this in for her as quickly as possible.    Plan:     We will plan to place a Port-A-Cath for neoadjuvant chemotherapy with subsequent plans for likely left breast lumpectomy and axillary dissection following her treatment.      

## 2011-06-18 NOTE — Anesthesia Postprocedure Evaluation (Signed)
Anesthesia Post Note  Patient: Kelsey Wilson  Procedure(s) Performed:  INSERTION PORT-A-CATH - right subclavian  Anesthesia type: General  Patient location: PACU  Post pain: Pain level controlled  Post assessment: Patient's Cardiovascular Status Stable  Last Vitals:  Filed Vitals:   06/18/11 0915  BP: 125/73  Pulse: 65  Temp:   Resp: 19    Post vital signs: Reviewed and stable  Level of consciousness: alert  Complications: No apparent anesthesia complications

## 2011-06-18 NOTE — Op Note (Signed)
Preoperative diagnosis: Cancer of the breast and the poor venous access  Postoperative diagnosis: Same  Procedure: Placement of Powerport subcutaneous venous port  Surgeon: Glenna Fellows M.D.  Anesthesia: Local with IV sedation  Description of procedure: Patient is brought to the operating room and placed in the supine position on the operating table. IV sedation was administered. The entire upper chest and neck were widely sterilely prepped and draped. Local anesthesia was used to infiltrate the insertion of port site. The right subclavian vein was cannulated with a needle and guidewire without difficulty and position in the superior vena cava was confirmed by fluoroscopy. The introducer was then placed over the guidewire and the flush catheter placed via the introducer which was stripped away and the tip of the catheter positioned near the cavoatrial junction. A small transverse incision was made in the anterior chest wall and subcutaneous pocket created. The catheter was tunneled into the pocket, trimmed to length, and attached to the flushed port which was positioned in the pocket. The port was sutured to the chest wall with interrupted 2-0 Prolene. The incisions were closed with subcutaneous interrupted Monocryl and the skin incisions closed with subcuticular Monocryl and Dermabond. The port was accessed and flushed and aspirated easily and was left flushed with concentrated heparin solution. Sponge needle as the counts were correct. The patient was taken to recovery in good condition.  Lenay Lovejoy T  06/18/2011

## 2011-06-19 ENCOUNTER — Telehealth: Payer: Self-pay | Admitting: Oncology

## 2011-06-19 ENCOUNTER — Encounter: Payer: Self-pay | Admitting: *Deleted

## 2011-06-19 ENCOUNTER — Encounter (HOSPITAL_BASED_OUTPATIENT_CLINIC_OR_DEPARTMENT_OTHER): Payer: Self-pay | Admitting: General Surgery

## 2011-06-19 ENCOUNTER — Telehealth: Payer: Self-pay | Admitting: *Deleted

## 2011-06-19 NOTE — Telephone Encounter (Signed)
Spoke with pt concerning [facility] from 06/13/11.  Pt denies questions pertaining to clinic, dx, or treatment at this time.  Confirmed appts for echo, CT/PET, chemo class, and f/u with Dr.Rubin.  Pt request consult with the nutritionist to discuss healthy eating habits while on chemotherapy.  Pt also concerned about finances.  Informed pt to speak with financial counselor and fill out financial assistance application to assist with medications.  Received verbal understanding.  Encourage pt to call with further needs or concerns.  Contact information given.

## 2011-06-19 NOTE — Telephone Encounter (Signed)
S/w the pt and she is aware of her nutrition appt on 06/25/2011 the same day as the pet scan appt.

## 2011-06-19 NOTE — Progress Notes (Signed)
Mailed after appt letter to pt. 

## 2011-06-20 ENCOUNTER — Ambulatory Visit (HOSPITAL_COMMUNITY)
Admission: RE | Admit: 2011-06-20 | Discharge: 2011-06-20 | Disposition: A | Discharge status: Home / Self Care | Source: Ambulatory Visit | Attending: Oncology | Admitting: Oncology

## 2011-06-20 DIAGNOSIS — E669 Obesity, unspecified: Secondary | ICD-10-CM | POA: Insufficient documentation

## 2011-06-20 DIAGNOSIS — E7800 Pure hypercholesterolemia, unspecified: Secondary | ICD-10-CM | POA: Insufficient documentation

## 2011-06-20 DIAGNOSIS — R0602 Shortness of breath: Secondary | ICD-10-CM

## 2011-06-20 DIAGNOSIS — R609 Edema, unspecified: Secondary | ICD-10-CM | POA: Insufficient documentation

## 2011-06-20 DIAGNOSIS — R0989 Other specified symptoms and signs involving the circulatory and respiratory systems: Secondary | ICD-10-CM | POA: Insufficient documentation

## 2011-06-20 DIAGNOSIS — Z01818 Encounter for other preprocedural examination: Secondary | ICD-10-CM | POA: Insufficient documentation

## 2011-06-20 DIAGNOSIS — C50419 Malignant neoplasm of upper-outer quadrant of unspecified female breast: Secondary | ICD-10-CM

## 2011-06-20 DIAGNOSIS — C50919 Malignant neoplasm of unspecified site of unspecified female breast: Secondary | ICD-10-CM | POA: Insufficient documentation

## 2011-06-20 DIAGNOSIS — R0609 Other forms of dyspnea: Secondary | ICD-10-CM | POA: Insufficient documentation

## 2011-06-20 NOTE — Progress Notes (Signed)
  Echocardiogram 2D Echocardiogram has been performed.  [name] Deneen 06/20/2011, 1:51 PM

## 2011-06-21 ENCOUNTER — Other Ambulatory Visit

## 2011-06-21 ENCOUNTER — Encounter: Payer: Self-pay | Admitting: *Deleted

## 2011-06-21 ENCOUNTER — Encounter: Payer: Self-pay | Admitting: Oncology

## 2011-06-21 NOTE — Progress Notes (Signed)
Put patient's fmla papers on nurse's desk °

## 2011-06-25 ENCOUNTER — Ambulatory Visit: Admitting: Nutrition

## 2011-06-25 ENCOUNTER — Encounter (HOSPITAL_COMMUNITY): Payer: Self-pay

## 2011-06-25 ENCOUNTER — Encounter (HOSPITAL_COMMUNITY)
Admission: RE | Admit: 2011-06-25 | Discharge: 2011-06-25 | Disposition: A | Discharge status: Home / Self Care | Source: Ambulatory Visit | Attending: Oncology | Admitting: Oncology

## 2011-06-25 DIAGNOSIS — K5730 Diverticulosis of large intestine without perforation or abscess without bleeding: Secondary | ICD-10-CM | POA: Insufficient documentation

## 2011-06-25 DIAGNOSIS — Z9089 Acquired absence of other organs: Secondary | ICD-10-CM | POA: Insufficient documentation

## 2011-06-25 DIAGNOSIS — Z90710 Acquired absence of both cervix and uterus: Secondary | ICD-10-CM | POA: Insufficient documentation

## 2011-06-25 DIAGNOSIS — C50419 Malignant neoplasm of upper-outer quadrant of unspecified female breast: Secondary | ICD-10-CM

## 2011-06-25 DIAGNOSIS — C50919 Malignant neoplasm of unspecified site of unspecified female breast: Secondary | ICD-10-CM | POA: Insufficient documentation

## 2011-06-25 IMAGING — PT NM PET TUM IMG INITIAL (PI) SKULL BASE T - THIGH
6 series · 25 of 25 positions shown · non-contrast
Comparison: None.

CLINICAL DATA: Initial treatment strategy for left sided breast
cancer.

NUCLEAR MEDICINE PET CT INITIAL (PI) SKULL BASE TO THIGH
TECHNIQUE: 17.9 mCi F-18 FDG was injected intravenously via the
right forearm.  Full-ring PET imaging was performed from the skull
base through the mid-thighs 61  minutes after injection.  CT data
was obtained and used for attenuation correction and anatomic
localization only.  (This was not acquired as a diagnostic CT
examination.)
Fasting Blood Glucose:  101

[Series 1: pet ac · axial · 3.3mm · 4.69mm/px · z∈[-726,+0]mm · 5 of 223 slices shown]
[im 1/223]
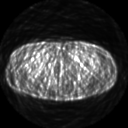
[im 56/223]
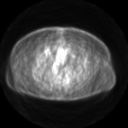
[im 112/223]
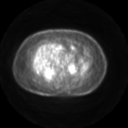
[im 167/223]
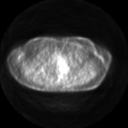
[im 223/223]
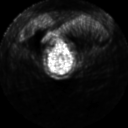

[Series 2: pet nac · axial · 3.3mm · 4.69mm/px · z∈[-726,+0]mm · 6 of 223 slices shown]
[im 1/223]
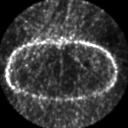
[im 45/223]
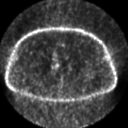
[im 89/223]
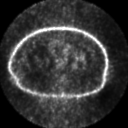
[im 134/223]
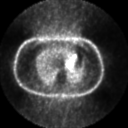
[im 178/223]
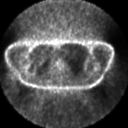
[im 223/223]
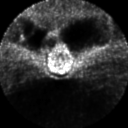

[Series 2: ct images · axial · 3.8mm · 0.98mm/px · z∈[-726,+0]mm · 5 of 222 slices shown]
[im 1/222]
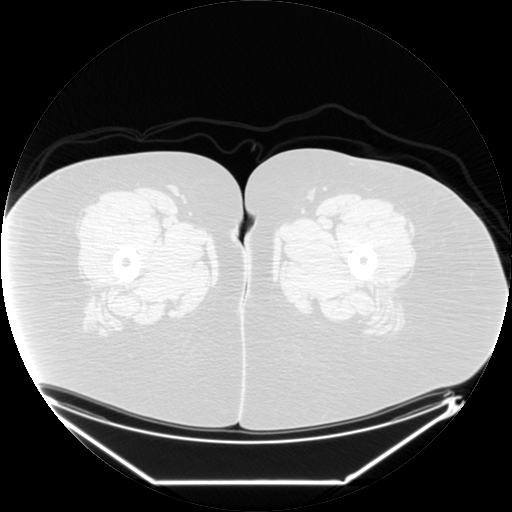
[im 56/222]
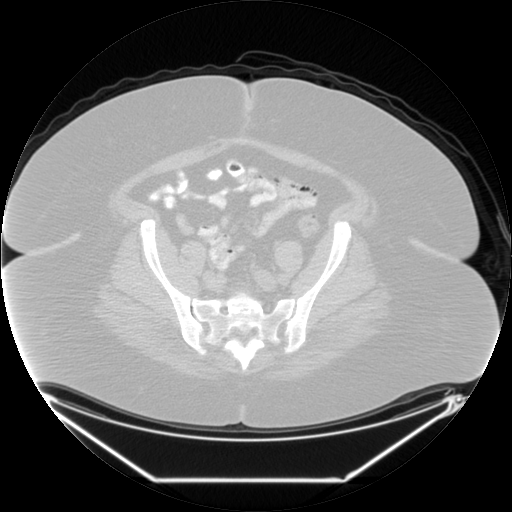
[im 111/222]
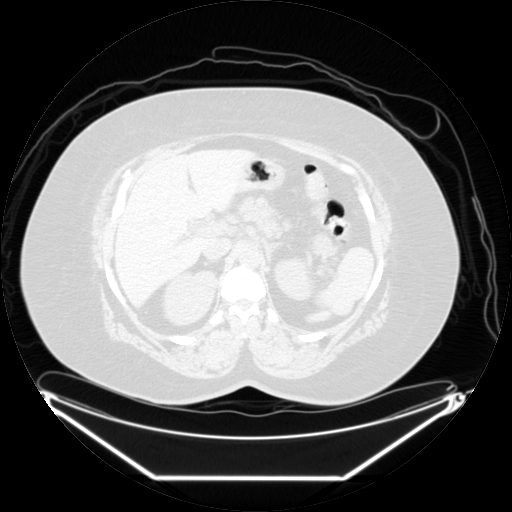
[im 166/222]
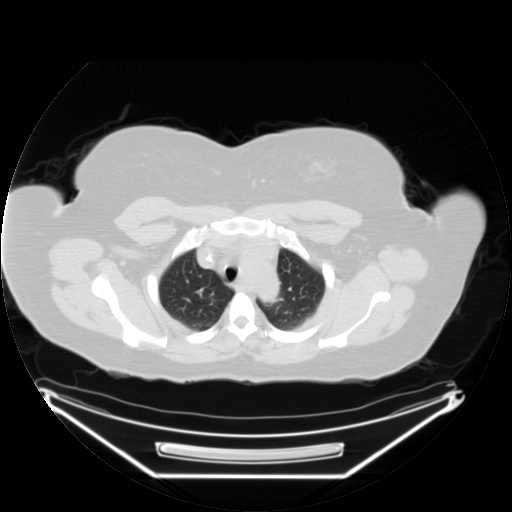
[im 222/222  brain]
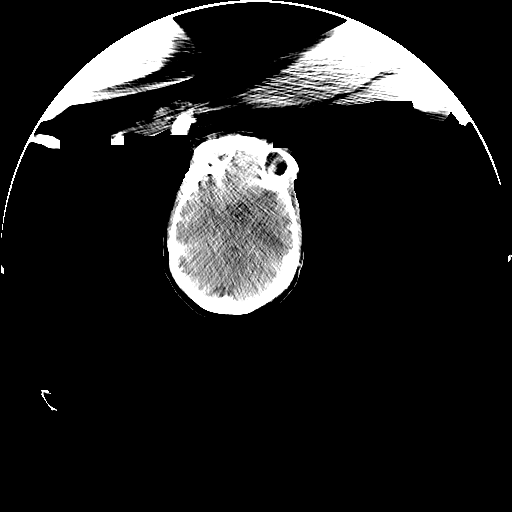

[Series 123: mip · coronal · 3.3mm · 4.69mm/px · 1 of 30 slices shown]
[im 1/30]
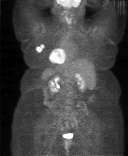

[Series 151: reformatted · axial · 3.3mm · 3.91mm/px · z∈[-726,+0]mm · 6 of 223 slices shown (1 of 2)]
[im 1/223]
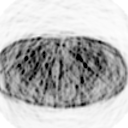
[im 45/223]
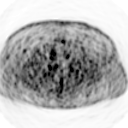
[im 89/223]
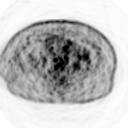
[im 134/223]
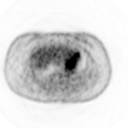
[im 178/223]
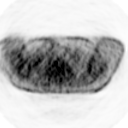
[im 223/223]
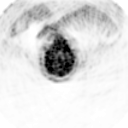

[Series 153: reformatted · coronal · 4.7mm · 5.83mm/px · 2 of 81 slices shown (2 of 2)]
[im 1/81]
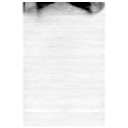
[im 81/81]
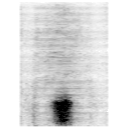

[25 of 25 positions shown; findings below may reference images not displayed]

FINDINGS: Skull Base and Neck:  There is slight asymmetric soft tissue
thickening noted extending from the left oropharynx onto the base
to the tongue (both on the left than right sides of the base of
tongue), with some associated increased metabolic activity on PET
images in these same regions(SUVmax = 9.0). No other hypermetabolic
activity or abnormal soft tissue masses are noted in the visualized
head and neck.

Thorax:  No abnormal hypermetabolic activity in the lungs, hilar
regions or mediastinum. Lungs are clear. No suspicious appearing
pulmonary nodules or masses. No mediastinal or hilar adenopathy.
Heart size is normal.  Incidental note is made of an aberrant right
subclavian artery (normal anatomical variant). There is a small
hiatal hernia.  Right internal jugular single lumen Port-A-Cath
with tip terminating at the superior cavoatrial junction.

In the lateral aspect of the left breast there are two large
hypermetabolic soft tissue nodules measuring 3.5 x 2.9 cm (SUVmax =
22.8) and 2.3 x 2.0 cm (SUVmax = 15.5). The larger of these two
nodules is partially calcified and likely represents the patient's
primary breast cancer.  The smaller lesion likely represents a
malignant left axillary lymph node.  Multiple smaller adjacent soft
tissue nodules are noted that do not demonstrate definitive
hypermetabolic activity on the PET portion of the examination.

Abdomen/Pelvis:  No abnormal hypermetabolic activity. The uninfused
appearance of the liver, pancreas, spleen, bilateral adrenal glands
and bilateral kidneys is unremarkable. Status post cholecystectomy.
No abnormal soft tissue masses or lymphadenopathy. No ascites or
pneumoperitoneum, and no pathologic distension of bowel. Surgical
changes of appendectomy and hysterectomy. There is diverticulosis
of the descending and sigmoid colon, without surrounding
inflammatory changes at this time to suggest acute diverticulitis.

Upper Thighs:  No abnormal hypermetabolic activity. No abnormal
soft tissue masses or lymphadenopathy.
IMPRESSION: 1.  Two large hypermetabolic soft tissue lesions in the lateral
aspect of the left breast, as above, consistent with the patient's
reported left sided breast cancer.  The larger of these two lesions
demonstrates speckles of internal calcification and likely reflects
the patient's primary breast cancer, while the smaller lesion
likely represents a metastatic left axillary lymph node.  There is
otherwise no definitive evidence (please see comments in conclusion
#2 below) of distant metastatic disease on today's examination.
2.  However, there is also asymmetric soft tissue thickening
extending from the left side of the oropharynx inferiorly to the
base of the tongue which demonstrates increased metabolic activity
(SUVmax = 9.0). While physiologic muscular activity is commonly
seen in portions of the head and neck, given the asymmetry of the
findings, particularly within the left side of the oropharynx, as
well as the relatively high SUV of this area direct visualization
of these regions is likely warranted to exclude a second primary
neoplasm.

These results were called by telephone on 06/25/2011 at [DATE] p.m.
to Dr. Olckers (who was covering for Dr. Partida), who verbally
acknowledged these results.

3.  Status post cholecystectomy, appendectomy and hysterectomy.
4.  Colonic diverticulosis without findings to suggest acute
diverticulitis.

## 2011-06-25 MED ORDER — FLUDEOXYGLUCOSE F - 18 (FDG) INJECTION
17.9000 | Freq: Once | INTRAVENOUS | Status: AC | PRN
  Administered 2011-06-25: 17.9 via INTRAVENOUS

## 2011-06-25 NOTE — Assessment & Plan Note (Signed)
Kelsey Wilson is a 51 year old female patient of Dr. Donnie Coffin, diagnosed with breast cancer.  She is receiving neoadjuvant chemotherapy.  HISTORY:  Includes cholecystectomy.  MEDICATIONS:  Include Norco.  LABORATORIES:  Labs were reviewed.  HEIGHT:  63 inches. WEIGHT:  244 pounds. BMI:  43.23.  The patient is interested in the foods she should eat during her chemotherapy.  NUTRITION DIAGNOSIS:  Food and nutrition-related knowledge deficit related to new diagnosis of breast cancer and associated treatments as evidenced by no prior need for nutrition-related information.  INTERVENTION:  I educated the patient and her mother on a healthy plant-based diet that is low in fat and high in vegetables and lean protein. I have encouraged her to consume small amounts more often throughout the day instead of 3 large meals.  I have encouraged her to fill half her plate with vegetables and then choose a small portion of lean protein and a small portion of complex carbohydrates.  I have discouraged sweetened beverages.  I have also educated her on the importance of consuming less processed food.  MONITORING/EVALUATION (GOALS):  That patient will tolerate a healthy plant-based diet to minimize side effects throughout treatment and to promote slow safe weight loss.    Food and nutrition-related knowledge deficit has resolved.  NEXT VISIT:  There is no followup scheduled.  The patient has my contact information for questions or concerns.    ______________________________ Kelsey Wilson, RD, LDN Clinical Nutrition Specialist BN/MEDQ  D:  06/25/2011  T:  06/25/2011  Job:  [id number]

## 2011-06-26 LAB — GLUCOSE, CAPILLARY: Glucose-Capillary: 101 mg/dL — ABNORMAL HIGH (ref 70–99)

## 2011-07-02 ENCOUNTER — Other Ambulatory Visit: Payer: Self-pay | Admitting: *Deleted

## 2011-07-02 ENCOUNTER — Encounter: Payer: Self-pay | Admitting: Oncology

## 2011-07-02 NOTE — Progress Notes (Signed)
Faxed fmla form to [phone], put originals in the registration desk.

## 2011-07-03 ENCOUNTER — Ambulatory Visit (HOSPITAL_BASED_OUTPATIENT_CLINIC_OR_DEPARTMENT_OTHER): Admitting: Oncology

## 2011-07-03 VITALS — BP 122/81 | HR 65 | Temp 97.8°F | Ht 63.0 in | Wt 241.4 lb

## 2011-07-03 DIAGNOSIS — Z170 Estrogen receptor positive status [ER+]: Secondary | ICD-10-CM

## 2011-07-03 DIAGNOSIS — C50419 Malignant neoplasm of upper-outer quadrant of unspecified female breast: Secondary | ICD-10-CM

## 2011-07-03 MED ORDER — ONDANSETRON HCL 8 MG PO TABS: ORAL_TABLET | ORAL | Status: DC

## 2011-07-03 MED ORDER — PROCHLORPERAZINE MALEATE 10 MG PO TABS: 10.0000 mg | ORAL_TABLET | Freq: Four times a day (QID) | ORAL | Status: DC | PRN

## 2011-07-03 MED ORDER — PROCHLORPERAZINE 25 MG RE SUPP: 25.0000 mg | Freq: Two times a day (BID) | RECTAL | Status: DC | PRN

## 2011-07-03 MED ORDER — LORAZEPAM 0.5 MG PO TABS: 0.5000 mg | ORAL_TABLET | Freq: Four times a day (QID) | ORAL | Status: DC | PRN

## 2011-07-03 MED ORDER — DEXAMETHASONE 4 MG PO TABS: ORAL_TABLET | ORAL | Status: DC

## 2011-07-03 MED ORDER — LIDOCAINE-PRILOCAINE 2.5-2.5 % EX CREA: TOPICAL_CREAM | Freq: Once | CUTANEOUS | Status: DC

## 2011-07-03 NOTE — Patient Instructions (Signed)
  YOU WILL RECEIVE FEC GIVEN EVERY 2 WEEKS FOR 4 TREATMENTS.Marland Kitchen THIS WILL BE FOLLOWED BY DOCETAXEL GIVEN EVERY 2 WEEKS FOR 4 TREATMENTS.

## 2011-07-04 ENCOUNTER — Encounter: Payer: Self-pay | Admitting: Oncology

## 2011-07-04 NOTE — Progress Notes (Signed)
Patient received five prescriptions from [facility] on 07/03/11 $185.09,her remaning balance [facility] $214.91and ALIGHT $600.00.

## 2011-07-05 ENCOUNTER — Encounter: Payer: Self-pay | Admitting: Oncology

## 2011-07-05 NOTE — Progress Notes (Signed)
Patient approve 100% [facility]  07/05/11 - 01/02/12.

## 2011-07-09 ENCOUNTER — Other Ambulatory Visit: Payer: Self-pay | Admitting: *Deleted

## 2011-07-09 DIAGNOSIS — C50419 Malignant neoplasm of upper-outer quadrant of unspecified female breast: Secondary | ICD-10-CM

## 2011-07-09 MED ORDER — LIDOCAINE-PRILOCAINE 2.5-2.5 % EX CREA: TOPICAL_CREAM | CUTANEOUS | Status: DC

## 2011-07-10 ENCOUNTER — Other Ambulatory Visit: Payer: Self-pay | Admitting: Certified Registered Nurse Anesthetist

## 2011-07-10 ENCOUNTER — Ambulatory Visit (HOSPITAL_BASED_OUTPATIENT_CLINIC_OR_DEPARTMENT_OTHER): Admitting: Physician Assistant

## 2011-07-10 ENCOUNTER — Other Ambulatory Visit (HOSPITAL_BASED_OUTPATIENT_CLINIC_OR_DEPARTMENT_OTHER): Admitting: Lab

## 2011-07-10 ENCOUNTER — Ambulatory Visit (HOSPITAL_BASED_OUTPATIENT_CLINIC_OR_DEPARTMENT_OTHER)

## 2011-07-10 ENCOUNTER — Telehealth: Payer: Self-pay | Admitting: Oncology

## 2011-07-10 ENCOUNTER — Encounter: Payer: Self-pay | Admitting: Oncology

## 2011-07-10 VITALS — BP 126/80 | HR 69 | Temp 98.2°F | Ht 63.0 in | Wt 239.3 lb

## 2011-07-10 DIAGNOSIS — Z09 Encounter for follow-up examination after completed treatment for conditions other than malignant neoplasm: Secondary | ICD-10-CM

## 2011-07-10 DIAGNOSIS — C50419 Malignant neoplasm of upper-outer quadrant of unspecified female breast: Secondary | ICD-10-CM

## 2011-07-10 DIAGNOSIS — Z5111 Encounter for antineoplastic chemotherapy: Secondary | ICD-10-CM

## 2011-07-10 DIAGNOSIS — Z170 Estrogen receptor positive status [ER+]: Secondary | ICD-10-CM

## 2011-07-10 DIAGNOSIS — C50919 Malignant neoplasm of unspecified site of unspecified female breast: Secondary | ICD-10-CM

## 2011-07-10 LAB — BASIC METABOLIC PANEL
BUN: 17 mg/dL (ref 6–23)
CO2: 28 mEq/L (ref 19–32)
Calcium: 9.6 mg/dL (ref 8.4–10.5)
Chloride: 104 mEq/L (ref 96–112)
Creatinine, Ser: 0.62 mg/dL (ref 0.50–1.10)
Glucose, Bld: 98 mg/dL (ref 70–99)
Potassium: 3.7 mEq/L (ref 3.5–5.3)
Sodium: 142 mEq/L (ref 135–145)

## 2011-07-10 LAB — CBC WITH DIFFERENTIAL/PLATELET
BASO%: 0.3 % (ref 0.0–2.0)
Basophils Absolute: 0 10*3/uL (ref 0.0–0.1)
EOS%: 4.1 % (ref 0.0–7.0)
Eosinophils Absolute: 0.1 10*3/uL (ref 0.0–0.5)
HCT: 35.6 % (ref 34.8–46.6)
HGB: 12.1 g/dL (ref 11.6–15.9)
LYMPH%: 49.9 % — ABNORMAL HIGH (ref 14.0–49.7)
MCH: 28.2 pg (ref 25.1–34.0)
MCHC: 34 g/dL (ref 31.5–36.0)
MCV: 83 fL (ref 79.5–101.0)
MONO#: 0.4 10*3/uL (ref 0.1–0.9)
MONO%: 10.3 % (ref 0.0–14.0)
NEUT#: 1.2 10*3/uL — ABNORMAL LOW (ref 1.5–6.5)
NEUT%: 35.4 % — ABNORMAL LOW (ref 38.4–76.8)
Platelets: 167 10*3/uL (ref 145–400)
RBC: 4.29 10*6/uL (ref 3.70–5.45)
RDW: 13.4 % (ref 11.2–14.5)
WBC: 3.4 10*3/uL — ABNORMAL LOW (ref 3.9–10.3)
lymph#: 1.7 10*3/uL (ref 0.9–3.3)

## 2011-07-10 MED ORDER — SODIUM CHLORIDE 0.9 % IV SOLN
Freq: Once | INTRAVENOUS | Status: AC
  Administered 2011-07-10: 13:00:00 via INTRAVENOUS

## 2011-07-10 MED ORDER — PALONOSETRON HCL INJECTION 0.25 MG/5ML
0.2500 mg | Freq: Once | INTRAVENOUS | Status: AC
  Administered 2011-07-10: 0.25 mg via INTRAVENOUS

## 2011-07-10 MED ORDER — DEXAMETHASONE SODIUM PHOSPHATE 4 MG/ML IJ SOLN
12.0000 mg | Freq: Once | INTRAMUSCULAR | Status: AC
  Administered 2011-07-10: 12 mg via INTRAVENOUS

## 2011-07-10 MED ORDER — SODIUM CHLORIDE 0.9 % IV SOLN
500.0000 mg/m2 | Freq: Once | INTRAVENOUS | Status: AC
  Administered 2011-07-10: 1100 mg via INTRAVENOUS
  Filled 2011-07-10: qty 55

## 2011-07-10 MED ORDER — FLUOROURACIL CHEMO INJECTION 2.5 GM/50ML
500.0000 mg/m2 | Freq: Once | INTRAVENOUS | Status: AC
  Administered 2011-07-10: 1100 mg via INTRAVENOUS
  Filled 2011-07-10: qty 22

## 2011-07-10 MED ORDER — EPIRUBICIN HCL CHEMO IV INJECTION 200 MG/100ML
100.0000 mg/m2 | Freq: Once | INTRAVENOUS | Status: AC
  Administered 2011-07-10: 222 mg via INTRAVENOUS
  Filled 2011-07-10: qty 111

## 2011-07-10 MED ORDER — HEPARIN SOD (PORK) LOCK FLUSH 100 UNIT/ML IV SOLN
500.0000 [IU] | Freq: Once | INTRAVENOUS | Status: AC | PRN
  Administered 2011-07-10: 500 [IU]
  Filled 2011-07-10: qty 5

## 2011-07-10 MED ORDER — LORAZEPAM 1 MG PO TABS
0.5000 mg | ORAL_TABLET | Freq: Once | ORAL | Status: AC
  Administered 2011-07-10: 0.5 mg via ORAL

## 2011-07-10 MED ORDER — SODIUM CHLORIDE 0.9 % IV SOLN
150.0000 mg | Freq: Once | INTRAVENOUS | Status: AC
  Administered 2011-07-10: 150 mg via INTRAVENOUS
  Filled 2011-07-10: qty 5

## 2011-07-10 MED ORDER — SODIUM CHLORIDE 0.9 % IJ SOLN
10.0000 mL | INTRAMUSCULAR | Status: DC | PRN
  Administered 2011-07-10: 10 mL
  Filled 2011-07-10: qty 10

## 2011-07-10 NOTE — Telephone Encounter (Signed)
gve the pt her feb,march,april 2013 appt calendars

## 2011-07-10 NOTE — Progress Notes (Signed)
Patient received one prescription from [facility] on 07/09/11 $11.65,her remaning balance [facility] $203.26and ALIGHT $600.00.

## 2011-07-10 NOTE — Progress Notes (Signed)
Hematology and Oncology Follow Up Visit  Kelsey Wilson [id number] 27-Feb-1961 51 y.o. 07/10/2011    HPI:    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 Due for a day 1 cycle 1 of 4 planned neoadjuvant dose dense FEC with Neulasta support on day 2.  Interim History:   [name] is seen today with her mother in accompaniment in anticipation of her first of 4 planned cycles of neoadjuvant dose dense FEC. Her power port was placed on 06/18/2011, echocardiogram from 06/20/2011 was reported between 55-60%. PET scan from 06/25/2011 was reviewed with patient on her followup appointment with Dr. Donnie Coffin. She is feeling well today, appropriately anxious. She denies any fevers chills night sweats shortness of breath or chest pain. She denies any nausea emesis diarrhea or constipation issues. She has her anti-emetics, and we have reviewed how I wish for her to take such.  A detailed review of systems is otherwise noncontributory as noted below.  Review of Systems: Constitutional:  no weight loss, fever, night sweats and feels well Eyes: No complaints ENT: No complaints Cardiovascular: no chest pain or dyspnea on exertion Respiratory: no cough, shortness of breath, or wheezing Neurological: no TIA or stroke symptoms Dermatological: negative Gastrointestinal: no abdominal pain, change in bowel habits, or black or bloody stools Genito-Urinary: no dysuria, trouble voiding, or hematuria Hematological and Lymphatic: negative Breast: known L breast cancer Musculoskeletal: negative Remaining ROS negative.   Medications:   I have reviewed the patient's current medications.  Current Outpatient Prescriptions  Medication Sig Dispense Refill  . dexamethasone (DECADRON) 4 MG tablet Take 2 tablets by mouth once a day on the day after chemotherapy and then take 2  tablets two times a day for 2 days. Take with food.  30 tablet  1  . lidocaine-prilocaine (EMLA) cream Apply topically as directed. ONE TO TWO HOURS BEFORE TREATMENT.  60 g  1  . LORazepam (ATIVAN) 0.5 MG tablet Take 1 tablet (0.5 mg total) by mouth every 6 (six) hours as needed (Nausea or vomiting).  30 tablet  0  . ondansetron (ZOFRAN) 8 MG tablet Take 1 tablet two times a day as needed for nausea or vomiting starting on the third day after chemotherapy.  30 tablet  1  . OVER THE COUNTER MEDICATION Pt. Not taking any medications      . prochlorperazine (COMPAZINE) 10 MG tablet Take 1 tablet (10 mg total) by mouth every 6 (six) hours as needed (Nausea or vomiting).  30 tablet  1  . prochlorperazine (COMPAZINE) 25 MG suppository Place 1 suppository (25 mg total) rectally every 12 (twelve) hours as needed for nausea.  12 suppository  3    Allergies:  Allergies  Allergen Reactions  . Penicillins Nausea Only    Physical Exam: Filed Vitals:   07/10/11 1112  BP: 126/80  Pulse: 69  Temp: 98.2 F (36.8 C)  Weight: 239 lbs. HEENT:  Sclerae anicteric, conjunctivae pink.  Oropharynx clear.  No mucositis or candidiasis.   Nodes:  No cervical, supraclavicular, or axillary lymphadenopathy palpated.  Breast Exam:  Deferred  Lungs:  Clear to auscultation bilaterally.  No crackles, rhonchi, or wheezes.   Heart:  Regular rate and rhythm.   Abdomen:  Soft, nontender.  Positive bowel sounds.  No organomegaly or masses palpated.   Musculoskeletal:  No focal spinal tenderness to palpation.  Extremities:  Benign.  No peripheral edema or cyanosis.   Skin:  Benign.   Neuro:  Nonfocal.   Lab Results: Lab Results  Component Value Date   WBC 3.4* 07/10/2011   HGB 12.1 07/10/2011   HCT 35.6 07/10/2011   MCV 83.0 07/10/2011   PLT 167 07/10/2011   NEUTROABS 1.2* 07/10/2011     Chemistry      Component Value Date/Time   NA 139 06/13/2011 0905   K 3.7 06/13/2011 0905   CL 104 06/13/2011 0905   CO2 27 06/13/2011 0905     BUN 14 06/13/2011 0905   CREATININE 0.70 06/13/2011 0905      Component Value Date/Time   CALCIUM 9.4 06/13/2011 0905   ALKPHOS 50 06/13/2011 0905   AST 21 06/13/2011 0905   ALT 17 06/13/2011 0905   BILITOT 0.6 06/13/2011 0905     Echocardiogram: 06/20/11 Study Conclusions - Left ventricle: The cavity size was normal. Systolic function was normal. The estimated ejection fraction was in the range of 55% to 60%. Wall motion was normal; there were no regional wall motion abnormalities. - Pulmonary arteries: PA peak pressure: 33mm Hg (S). Transthoracic echocardiography. M-mode, complete 2D, spectral Doppler, and color Doppler. Height: Height: 160cm. Height: 63in. Weight: Weight: 110.9kg. Weight: 244lb. Body mass index: BMI: 43.3kg/m^2. Body surface area: BSA: 2.60m^2. Blood pressure: 119/76. Patient status: Outpatient. Location: Echo laboratory.     Radiological Studies Nm Pet Image Initial (pi) Skull Base To Thigh  06/25/2011 IMPRESSION:  1.  Two large hypermetabolic soft tissue lesions in the lateral aspect of the left breast, as above, consistent with the patient's reported left sided breast cancer.  The larger of these two lesions demonstrates speckles of internal calcification and likely reflects the patient's primary breast cancer, while the smaller lesion likely represents a metastatic left axillary lymph node.  There is otherwise no definitive evidence (please see comments in conclusion #2 below) of distant metastatic disease on today's examination. 2.  However, there is also asymmetric soft tissue thickening extending from the left side of the oropharynx inferiorly to the base of the tongue which demonstrates increased metabolic activity (SUVmax = 9.0). While physiologic muscular activity is commonly seen in portions of the head and neck, given the asymmetry of the findings, particularly within the left side of the oropharynx, as well as the relatively high SUV of this area direct  visualization of these regions is likely warranted to exclude a second primary neoplasm.  These results were called by telephone on 06/25/2011 at 12:10 p.m. to Dr. Truett Perna (who was covering for Dr. Donnie Coffin), who verbally acknowledged these results.  3.  Status post cholecystectomy, appendectomy and hysterectomy. 4.  Colonic diverticulosis without findings to suggest acute diverticulitis.  Original Report Authenticated By: Florencia Reasons, M.D.   Dg Chest Port 1 View  06/18/2011  *RADIOLOGY REPORT*  Clinical Data: Port-A-Cath insertion.  PORTABLE CHEST - 1 VIEW  Comparison: 07/18/2006  Findings: Right subclavian Port-A-Cath is in place with the tip in the lower SVC.  There is mild cardiomegaly with low lung volumes. No effusions or confluent airspace opacity.  IMPRESSION: Right Port-A-Cath tip in the lower SVC.  No pneumothorax.  Mild cardiomegaly.  Original Report Authenticated By: Cyndie Chime, M.D.    Assessment:  Ms. Bowmer is a 51 year old [place] woman with a locally advanced clinical T3 N1 left breast carcinoma ER/PR positive at 8/9% respectively, HER-2 negative with a Ki-67 of 100%. Due for a day 1 cycle  1 of 4 planned neoadjuvant dose dense FEC with Neulasta support on day 2.  Case has been reviewed with Dr. Pierce Crane.  Plan:  Ms. Muckle will receive day 1 cycle 1 of 4 planned neoadjuvant dose dense FEC today as schedule with Neulasta support on day 2. I have added lorazepam 0.5 mg to her premeds. She will initiate loratadine 10 mg between days to and 5. I have given her printout in regards to taking her dexamethasone 8 mg by mouth twice a day x3 days post treatment with utilization of Compazine 10 mg tablets or suppositories as needed for nausea or emesis. She understands that ondansetron would be taken after day 3. I will see her in one week's time for nadir assessment and in 2 weeks time prior to day 1 cycle 2 do include breast exam. She knows to contact us prior to her  one-week followup if the need should arise.   This plan was reviewed with the patient, who voices understanding and agreement.  She knows to call with any changes or problems.    Jaydin Boniface T, PA-C 07/10/2011

## 2011-07-10 NOTE — Patient Instructions (Signed)
See calender for Decadron dosing.Marland Kitchen.(Compazine usage also noted)  Pertaining to Zofran 8mg  tablets---IF needed, you would start 3 days after chemotherapy (for 1st treatment, you would start on 07/14/11 IF NEEDED)  You may use Ativan (Lorazepam) 0.5mg  tabs WHENEVER you need then (ie, for sleep, anxiety, nausea)  Take 1-2 every 6-8 hrs.    Claritin (TRADE or Store brand-Loratadine 10mg ), can start before you come in for the Neulasta shot on D2--take for 3-4 days.

## 2011-07-11 ENCOUNTER — Encounter: Payer: Self-pay | Admitting: Oncology

## 2011-07-11 ENCOUNTER — Telehealth: Payer: Self-pay | Admitting: *Deleted

## 2011-07-11 ENCOUNTER — Ambulatory Visit (HOSPITAL_BASED_OUTPATIENT_CLINIC_OR_DEPARTMENT_OTHER)

## 2011-07-11 VITALS — BP 92/61 | HR 72 | Temp 96.6°F

## 2011-07-11 DIAGNOSIS — C50419 Malignant neoplasm of upper-outer quadrant of unspecified female breast: Secondary | ICD-10-CM

## 2011-07-11 MED ORDER — PEGFILGRASTIM INJECTION 6 MG/0.6ML
6.0000 mg | Freq: Once | SUBCUTANEOUS | Status: AC
  Administered 2011-07-11: 6 mg via SUBCUTANEOUS
  Filled 2011-07-11: qty 0.6

## 2011-07-11 NOTE — Progress Notes (Signed)
Injection Appt: Pt. arrived for neulasta post chemo.  Had questions about Neulasta injection. Written and verbal info. reviewed with pt. Pt. Stated that she vomited once last night and once this am. No vomiting during visit today but has moderate nausea. Verbal info. Reviewed RU:[id number] use, temp. Monitoring and call if further questions or issues. Instructed to continue with hydration and rest at home.  No further questions on discharge.

## 2011-07-11 NOTE — Telephone Encounter (Signed)
I feel weak but I haven't vomited anymore since I was there to receive the injection.  Taking anti-emetics.  Able to eat and drink small amounts (soup, saltines, gingerale and water).  Asked how she is to take the dexamethasone.  Started this morning with one pill.  Orders read to take two 4mg  tabs so this nurse instructed her to take two pills twice daily through Friday.  No further questions.  Reviewed how to call after hours if needed.

## 2011-07-11 NOTE — Telephone Encounter (Signed)
Message copied by Augusto Garbe on Wed Jul 11, 2011  3:54 PM ------      Message from: Carola Rhine A      Created: Tue Jul 10, 2011  4:24 PM      Regarding: Chemo follow up      Contact: [phone]       1st [place], 5FU and Cytoxan.

## 2011-07-11 NOTE — Progress Notes (Signed)
Patient received one prescription from [facility] on 07/09/11,her remaning balance [facility] $203.26and ALIGHT $600.00.

## 2011-07-11 NOTE — Patient Instructions (Signed)
[facility] Discharge Instructions for Patients Receiving  Today you received the following Neulasta  To help prevent nausea and vomiting after your treatment, we encourage you to take your nausea medication as prescribed Dr. Donnie Coffin.    If you develop nausea and vomiting that is not controlled by your nausea medication, call the clinic. If it is after clinic hours your family physician or the after hours number for the clinic or go to the Emergency Department.   BELOW ARE SYMPTOMS THAT SHOULD BE REPORTED IMMEDIATELY:  *FEVER GREATER THAN 100.5 F  *CHILLS WITH OR WITHOUT FEVER  NAUSEA AND VOMITING THAT IS NOT CONTROLLED WITH YOUR NAUSEA MEDICATION  *UNUSUAL SHORTNESS OF BREATH  *UNUSUAL BRUISING OR BLEEDING  TENDERNESS IN MOUTH AND THROAT WITH OR WITHOUT PRESENCE OF ULCERS  *URINARY PROBLEMS  *BOWEL PROBLEMS  UNUSUAL RASH Items with * indicate a potential emergency and should be followed up as soon as possible.   Feel free to call the clinic you have any questions or concerns. The clinic phone number is [phone].   I have been informed and understand all the instructions given to me. I know to contact the clinic, my physician, or go to the Emergency Department if any problems should occur. I do not have any questions at this time, but understand that I may call the clinic during office hours   should I have any questions or need assistance in obtaining follow up care.    __________________________________________  _____________  __________ Signature of Patient or Authorized Representative            Date                   Time    __________________________________________ Nurse's Signature

## 2011-07-17 ENCOUNTER — Ambulatory Visit (HOSPITAL_BASED_OUTPATIENT_CLINIC_OR_DEPARTMENT_OTHER): Admitting: Physician Assistant

## 2011-07-17 ENCOUNTER — Other Ambulatory Visit: Admitting: Lab

## 2011-07-17 DIAGNOSIS — D702 Other drug-induced agranulocytosis: Secondary | ICD-10-CM

## 2011-07-17 DIAGNOSIS — C50419 Malignant neoplasm of upper-outer quadrant of unspecified female breast: Secondary | ICD-10-CM

## 2011-07-17 DIAGNOSIS — C50912 Malignant neoplasm of unspecified site of left female breast: Secondary | ICD-10-CM

## 2011-07-17 DIAGNOSIS — T50904A Poisoning by unspecified drugs, medicaments and biological substances, undetermined, initial encounter: Secondary | ICD-10-CM

## 2011-07-17 LAB — CBC WITH DIFFERENTIAL/PLATELET
BASO%: 0 % (ref 0.0–2.0)
Basophils Absolute: 0 10*3/uL (ref 0.0–0.1)
EOS%: 9.4 % — ABNORMAL HIGH (ref 0.0–7.0)
Eosinophils Absolute: 0.1 10*3/uL (ref 0.0–0.5)
HCT: 32 % — ABNORMAL LOW (ref 34.8–46.6)
HGB: 11 g/dL — ABNORMAL LOW (ref 11.6–15.9)
LYMPH%: 67 % — ABNORMAL HIGH (ref 14.0–49.7)
MCH: 28.2 pg (ref 25.1–34.0)
MCHC: 34.4 g/dL (ref 31.5–36.0)
MCV: 82.1 fL (ref 79.5–101.0)
MONO#: 0 10*3/uL — ABNORMAL LOW (ref 0.1–0.9)
MONO%: 1.9 % (ref 0.0–14.0)
NEUT#: 0.2 10*3/uL — CL (ref 1.5–6.5)
NEUT%: 21.7 % — ABNORMAL LOW (ref 38.4–76.8)
Platelets: 71 10*3/uL — ABNORMAL LOW (ref 145–400)
RBC: 3.9 10*6/uL (ref 3.70–5.45)
RDW: 12.9 % (ref 11.2–14.5)
WBC: 1.1 10*3/uL — ABNORMAL LOW (ref 3.9–10.3)
lymph#: 0.7 10*3/uL — ABNORMAL LOW (ref 0.9–3.3)
nRBC: 0 % (ref 0–0)

## 2011-07-17 MED ORDER — CIPROFLOXACIN HCL 500 MG PO TABS: 500.0000 mg | ORAL_TABLET | Freq: Two times a day (BID) | ORAL | Status: AC

## 2011-07-17 NOTE — Patient Instructions (Addendum)
1. Your white blood cell count is low, which is expected despite the Neulasta injection you received on 07/11/11     - We will start you on Cipro 500mg , take one tablet twice a day through 07/21/11.     - Watch your temperatures.  If temp 100.0 or greater, contact MD/on-call MD     - Your WBC (white blood cell) count will recover by your next treatment, which is scheduled for 07/24/11.  2. Kelsey Wilson diet is best.  DO NOT EAT IN public right now--OK after 07/21/11.   3. You may use Advil 200-400mg  every 6-8 hours if needed for bone pain associated with Neulasta, may also try Hydrocodone/APAP if needed.   4. May use PEPCID [facility] for mild heartburn ("gas") symptoms, may also try GasEX (use as directed)

## 2011-07-17 NOTE — Progress Notes (Signed)
Hematology and Oncology Follow Up Visit  Kelsey Wilson [id number] November 19, 1960 51 y.o. 07/17/2011    HPI: Kelsey Wilson is a 51 year old [place] woman with a locally advanced clinical T3 N1 left breast carcinoma ER/PR positive at 8/9% respectively, HER-2 negative with a Ki-67 of 100%.Currently day 7 cycle 1 of 4 planned neoadjuvant dose dense FEC with Neulasta support on day 2.  Interim History:   Kelsey Wilson is seen today for followup after her first of 4 planned neoadjuvant dose dense FEC being given for a locally advanced, clinical T3, N1, ER/PR positive, HER-2 negative left breast carcinoma. She did receive Neulasta for granulocyte support on day 2. Overall she is doing pretty well, she admits this has some fatigue, she denies any fevers or chills no shortness of breath per se or chest pain. No persistent nausea or emesis issues, she does have some "gas", but denies frank heartburn symptoms. No diarrhea or constipation issues. She has some low-grade low back discomfort, she has tried left over hydrocodone/APAP from her port placement which seemed to help a bit. She has not appreciated any alopecia as of yet. She denies any mouth sores.  A detailed review of systems is otherwise noncontributory as noted below.  Review of Systems: Constitutional:  no weight loss, fever, night sweats and feels well Eyes: No complaints ENT: No complaints Cardiovascular: no chest pain or dyspnea on exertion Respiratory: no cough, shortness of breath, or wheezing Neurological: no TIA or stroke symptoms Dermatological: negative Gastrointestinal: no abdominal pain, change in bowel habits, or black or bloody stools Genito-Urinary: no dysuria, trouble voiding, or hematuria Hematological and Lymphatic: negative Breast: known L breast cancer Musculoskeletal: negative Remaining ROS negative.   Medications:   I have reviewed the patient's current medications.  Current Outpatient Prescriptions    Medication Sig Dispense Refill  . ciprofloxacin (CIPRO) 500 MG tablet Take 1 tablet (500 mg total) by mouth 2 (two) times daily. 1 po BID as directed for neutropenia secondary to chemotherapy  60 tablet  0  . dexamethasone (DECADRON) 4 MG tablet Take 2 tablets by mouth once a day on the day after chemotherapy and then take 2 tablets two times a day for 2 days. Take with food.  30 tablet  1  . lidocaine-prilocaine (EMLA) cream Apply topically as directed. ONE TO TWO HOURS BEFORE TREATMENT.  60 g  1  . LORazepam (ATIVAN) 0.5 MG tablet Take 1 tablet (0.5 mg total) by mouth every 6 (six) hours as needed (Nausea or vomiting).  30 tablet  0  . ondansetron (ZOFRAN) 8 MG tablet Take 1 tablet two times a day as needed for nausea or vomiting starting on the third day after chemotherapy.  30 tablet  1  . OVER THE COUNTER MEDICATION Pt. Not taking any medications      . prochlorperazine (COMPAZINE) 10 MG tablet Take 1 tablet (10 mg total) by mouth every 6 (six) hours as needed (Nausea or vomiting).  30 tablet  1  . prochlorperazine (COMPAZINE) 25 MG suppository Place 1 suppository (25 mg total) rectally every 12 (twelve) hours as needed for nausea.  12 suppository  3    Allergies:  Allergies  Allergen Reactions  . Penicillins Nausea Only    Physical Exam: Filed Vitals:   07/17/11 1453  BP: 119/78  Pulse: 71  Temp: 97.6 F (36.4 C)  Weight: 235 lbs. HEENT:  Sclerae anicteric, conjunctivae pink.  Oropharynx clear.  No mucositis or candidiasis.   Nodes:  No  cervical, supraclavicular, or axillary lymphadenopathy palpated.  Breast Exam:  Deferred  Lungs:  Clear to auscultation bilaterally.  No crackles, rhonchi, or wheezes.   Heart:  Regular rate and rhythm.   Abdomen:  Soft, nontender.  Positive bowel sounds.  No organomegaly or masses palpated.   Musculoskeletal:  No focal spinal tenderness to palpation.  Extremities:  Benign.  No peripheral edema or cyanosis.   Skin:  Benign.   Neuro:   Nonfocal.   Lab Results: Lab Results  Component Value Date   WBC 1.1* 07/17/2011   HGB 11.0* 07/17/2011   HCT 32.0* 07/17/2011   MCV 82.1 07/17/2011   PLT 71* 07/17/2011   NEUTROABS 0.2* 07/17/2011     Chemistry      Component Value Date/Time   NA 142 07/10/2011 1052   K 3.7 07/10/2011 1052   CL 104 07/10/2011 1052   CO2 28 07/10/2011 1052   BUN 17 07/10/2011 1052   CREATININE 0.62 07/10/2011 1052      Component Value Date/Time   CALCIUM 9.6 07/10/2011 1052   ALKPHOS 50 06/13/2011 0905   AST 21 06/13/2011 0905   ALT 17 06/13/2011 0905   BILITOT 0.6 06/13/2011 0905     Echocardiogram: 06/20/11 Study Conclusions - Left ventricle: The cavity size was normal. Systolic function was normal. The estimated ejection fraction was in the range of 55% to 60%. Wall motion was normal; there were no regional wall motion abnormalities. - Pulmonary arteries: PA peak pressure: 33mm Hg (S). Transthoracic echocardiography. M-mode, complete 2D, spectral Doppler, and color Doppler. Height: Height: 160cm. Height: 63in. Weight: Weight: 110.9kg. Weight: 244lb. Body mass index: BMI: 43.3kg/m^2. Body surface area: BSA: 2.21m^2. Blood pressure: 119/76. Patient status: Outpatient. Location: Echo laboratory.     Radiological Studies Nm Pet Image Initial (pi) Skull Base To Thigh  06/25/2011 IMPRESSION:  1.  Two large hypermetabolic soft tissue lesions in the lateral aspect of the left breast, as above, consistent with the patient's reported left sided breast cancer.  The larger of these two lesions demonstrates speckles of internal calcification and likely reflects the patient's primary breast cancer, while the smaller lesion likely represents a metastatic left axillary lymph node.  There is otherwise no definitive evidence (please see comments in conclusion #2 below) of distant metastatic disease on today's examination. 2.  However, there is also asymmetric soft tissue thickening extending from the left side of the  oropharynx inferiorly to the base of the tongue which demonstrates increased metabolic activity (SUVmax = 9.0). While physiologic muscular activity is commonly seen in portions of the head and neck, given the asymmetry of the findings, particularly within the left side of the oropharynx, as well as the relatively high SUV of this area direct visualization of these regions is likely warranted to exclude a second primary neoplasm.  These results were called by telephone on 06/25/2011 at 12:10 p.m. to Dr. Truett Perna (who was covering for Dr. Donnie Coffin), who verbally acknowledged these results.  3.  Status post cholecystectomy, appendectomy and hysterectomy. 4.  Colonic diverticulosis without findings to suggest acute diverticulitis.  Original Report Authenticated By: Florencia Reasons, M.D.   Dg Chest Port 1 View  06/18/2011  *RADIOLOGY REPORT*  Clinical Data: Port-A-Cath insertion.  PORTABLE CHEST - 1 VIEW  Comparison: 07/18/2006  Findings: Right subclavian Port-A-Cath is in place with the tip in the lower SVC.  There is mild cardiomegaly with low lung volumes. No effusions or confluent airspace opacity.  IMPRESSION: Right Port-A-Cath tip in the lower  SVC.  No pneumothorax.  Mild cardiomegaly.  Original Report Authenticated By: Cyndie Chime, M.D.    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 Currently day 7 cycle 1 of 4 planned neoadjuvant dose dense FEC with Neulasta support on day 2. 2. A febrile neutropenia despite Neulasta on day 2.  Case has been reviewed with Dr. Pierce Crane.  Plan:  Kelsey Wilson will monitor her temperatures, and follow neutropenic precautions. She will initiate ciprofloxacin other milligrams by mouth twice a day. She will take the Cipro through 07/21/2011. I will see her in one week's time prior to day 1 cycle 2 of 4 planned  neoadjuvant dose dense FEC, this will also include a breast exam. This plan was reviewed with the patient, who voices understanding and agreement.  She knows to call with any changes or problems.    Kelsey Guilford T, PA-C 07/17/2011

## 2011-07-18 ENCOUNTER — Encounter: Payer: Self-pay | Admitting: Oncology

## 2011-07-18 NOTE — Progress Notes (Signed)
Patient received one prescription from [place] [facility] on 07/17/11 $37.97,her remaning balance [facility] $165.29and [facility] $600.00.

## 2011-07-23 ENCOUNTER — Ambulatory Visit (HOSPITAL_BASED_OUTPATIENT_CLINIC_OR_DEPARTMENT_OTHER): Admitting: Physician Assistant

## 2011-07-23 ENCOUNTER — Other Ambulatory Visit: Admitting: Lab

## 2011-07-23 VITALS — BP 122/77 | HR 60 | Temp 98.4°F | Ht 63.0 in | Wt 234.1 lb

## 2011-07-23 DIAGNOSIS — C50419 Malignant neoplasm of upper-outer quadrant of unspecified female breast: Secondary | ICD-10-CM

## 2011-07-23 LAB — LACTATE DEHYDROGENASE: LDH: 237 U/L (ref 94–250)

## 2011-07-23 LAB — COMPREHENSIVE METABOLIC PANEL
ALT: 12 U/L (ref 0–35)
AST: 16 U/L (ref 0–37)
Albumin: 3.8 g/dL (ref 3.5–5.2)
Alkaline Phosphatase: 52 U/L (ref 39–117)
BUN: 15 mg/dL (ref 6–23)
CO2: 26 mEq/L (ref 19–32)
Calcium: 8.9 mg/dL (ref 8.4–10.5)
Chloride: 105 mEq/L (ref 96–112)
Creatinine, Ser: 0.79 mg/dL (ref 0.50–1.10)
Glucose, Bld: 96 mg/dL (ref 70–99)
Potassium: 3.8 mEq/L (ref 3.5–5.3)
Sodium: 141 mEq/L (ref 135–145)
Total Bilirubin: 0.3 mg/dL (ref 0.3–1.2)
Total Protein: 6.2 g/dL (ref 6.0–8.3)

## 2011-07-23 LAB — CBC WITH DIFFERENTIAL/PLATELET
BASO%: 0.2 % (ref 0.0–2.0)
Basophils Absolute: 0 10*3/uL (ref 0.0–0.1)
EOS%: 0.1 % (ref 0.0–7.0)
Eosinophils Absolute: 0 10*3/uL (ref 0.0–0.5)
HCT: 33.5 % — ABNORMAL LOW (ref 34.8–46.6)
HGB: 11.4 g/dL — ABNORMAL LOW (ref 11.6–15.9)
LYMPH%: 17.9 % (ref 14.0–49.7)
MCH: 29.2 pg (ref 25.1–34.0)
MCHC: 33.9 g/dL (ref 31.5–36.0)
MCV: 86 fL (ref 79.5–101.0)
MONO#: 0.4 10*3/uL (ref 0.1–0.9)
MONO%: 5.1 % (ref 0.0–14.0)
NEUT#: 5.6 10*3/uL (ref 1.5–6.5)
NEUT%: 76.7 % (ref 38.4–76.8)
Platelets: 159 10*3/uL (ref 145–400)
RBC: 3.9 10*6/uL (ref 3.70–5.45)
RDW: 13.3 % (ref 11.2–14.5)
WBC: 7.3 10*3/uL (ref 3.9–10.3)
lymph#: 1.3 10*3/uL (ref 0.9–3.3)

## 2011-07-23 NOTE — Patient Instructions (Signed)
1. Please start Cipro 500mg  tablets on 07/28/11-take 1 in am, 1 in pm.  2. Use Tylenol for bone pain associated with Neulasta     Try Claritin, taking 1/day 4-5d after the Neulasta shot (starting the day of the shot)

## 2011-07-23 NOTE — Progress Notes (Signed)
Hematology and Oncology Follow Up Visit  Kelsey Wilson [id number] 08-16-1960 51 y.o. 07/23/2011    HPI: 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 For day 1 cycle 2 of 4 planned neoadjuvant dose dense FEC, on 07/24/2011 with Neulasta support on day 2. 2. History of severe afebrile neutropenia despite Neulasta on day 2. Covered prophylactically with Cipro 500 mg by mouth twice a day  Interim History:   Kelsey Wilson is seen today with her family member in accompaniment in anticipation of day 1 cycle 2 of 4 planned neoadjuvant dose dense FEC on 07/24/2011 for her history of a locally advanced, clinical T3, N1, ER/PR positive HER-2 negative left breast carcinoma. She is feeling well today. She tolerated ciprofloxacin 500 mg by mouth twice a day without difficulty. She denied any oral thrush her vaginal candidiasis. Today she is feeling well without fevers, chills, night sweats. No nausea emesis diarrhea or constipation issues. No shortness of breath or chest pain. She denies any left breast pain, and she admits she has not checked on the mass.  A detailed review of systems is otherwise noncontributory as noted below.  Review of Systems: Constitutional:  no weight loss, fever, night sweats and feels well Eyes: No complaints ENT: No complaints Cardiovascular: no chest pain or dyspnea on exertion Respiratory: no cough, shortness of breath, or wheezing Neurological: no TIA or stroke symptoms Dermatological: negative Gastrointestinal: no abdominal pain, change in bowel habits, or black or bloody stools Genito-Urinary: no dysuria, trouble voiding, or hematuria Hematological and Lymphatic: negative Breast: known L breast cancer Musculoskeletal: negative Remaining ROS negative.   Medications:   I have reviewed the patient's current  medications.  Current Outpatient Prescriptions  Medication Sig Dispense Refill  . ciprofloxacin (CIPRO) 500 MG tablet Take 1 tablet (500 mg total) by mouth 2 (two) times daily. 1 po BID as directed for neutropenia secondary to chemotherapy  60 tablet  0  . dexamethasone (DECADRON) 4 MG tablet Take 2 tablets by mouth once a day on the day after chemotherapy and then take 2 tablets two times a day for 2 days. Take with food.  30 tablet  1  . lidocaine-prilocaine (EMLA) cream Apply topically as directed. ONE TO TWO HOURS BEFORE TREATMENT.  60 g  1  . LORazepam (ATIVAN) 0.5 MG tablet Take 1 tablet (0.5 mg total) by mouth every 6 (six) hours as needed (Nausea or vomiting).  30 tablet  0  . ondansetron (ZOFRAN) 8 MG tablet Take 1 tablet two times a day as needed for nausea or vomiting starting on the third day after chemotherapy.  30 tablet  1  . OVER THE COUNTER MEDICATION Pt. Not taking any medications      . prochlorperazine (COMPAZINE) 10 MG tablet Take 1 tablet (10 mg total) by mouth every 6 (six) hours as needed (Nausea or vomiting).  30 tablet  1  . prochlorperazine (COMPAZINE) 25 MG suppository Place 1 suppository (25 mg total) rectally every 12 (twelve) hours as needed for nausea.  12 suppository  3    Allergies:  Allergies  Allergen Reactions  . Penicillins Nausea Only    Physical Exam: Filed Vitals:   07/23/11 1259  BP: 122/77  Pulse: 60  Temp: 98.4 F (36.9 C)  Weight: 234 lbs. HEENT:  Sclerae anicteric, conjunctivae pink.  Oropharynx clear.  No mucositis or candidiasis.  Nodes:  No cervical, supraclavicular, or axillary lymphadenopathy palpated.  Breast Exam:  The left breast was examined, the mass in the upper outer quadrant, almost the tail of the breast, measures about 4.5 cm by caliber measurement. Of note, the best position is for patient to lie on her right side. Remaining breast tissue is free of palpable masses, no skin retraction, nipple inversion or discharge. Lungs:   Clear to auscultation bilaterally.  No crackles, rhonchi, or wheezes.   Heart:  Regular rate and rhythm.   Abdomen:  Soft, nontender.  Positive bowel sounds.  No organomegaly or masses palpated.   Musculoskeletal:  No focal spinal tenderness to palpation.  Extremities:  Benign.  No peripheral edema or cyanosis.   Skin:  Benign.   Neuro:  Nonfocal, alert and oriented x 3.   Lab Results: Lab Results  Component Value Date   WBC 7.3 07/23/2011   HGB 11.4* 07/23/2011   HCT 33.5* 07/23/2011   MCV 86.0 07/23/2011   PLT 159 07/23/2011   NEUTROABS 5.6 07/23/2011     Chemistry      Component Value Date/Time   NA 142 07/10/2011 1052   K 3.7 07/10/2011 1052   CL 104 07/10/2011 1052   CO2 28 07/10/2011 1052   BUN 17 07/10/2011 1052   CREATININE 0.62 07/10/2011 1052      Component Value Date/Time   CALCIUM 9.6 07/10/2011 1052   ALKPHOS 50 06/13/2011 0905   AST 21 06/13/2011 0905   ALT 17 06/13/2011 0905   BILITOT 0.6 06/13/2011 0905     Echocardiogram: 06/20/11 Study Conclusions - Left ventricle: The cavity size was normal. Systolic function was normal. The estimated ejection fraction was in the range of 55% to 60%. Wall motion was normal; there were no regional wall motion abnormalities. - Pulmonary arteries: PA peak pressure: 33mm Hg (S). Transthoracic echocardiography. M-mode, complete 2D, spectral Doppler, and color Doppler. Height: Height: 160cm. Height: 63in. Weight: Weight: 110.9kg. Weight: 244lb. Body mass index: BMI: 43.3kg/m^2. Body surface area: BSA: 2.57m^2. Blood pressure: 119/76. Patient status: Outpatient. Location: Echo laboratory.     Radiological Studies Nm Pet Image Initial (pi) Skull Base To Thigh  06/25/2011 IMPRESSION:  1.  Two large hypermetabolic soft tissue lesions in the lateral aspect of the left breast, as above, consistent with the patient's reported left sided breast cancer.  The larger of these two lesions demonstrates speckles of internal calcification and likely  reflects the patient's primary breast cancer, while the smaller lesion likely represents a metastatic left axillary lymph node.  There is otherwise no definitive evidence (please see comments in conclusion #2 below) of distant metastatic disease on today's examination. 2.  However, there is also asymmetric soft tissue thickening extending from the left side of the oropharynx inferiorly to the base of the tongue which demonstrates increased metabolic activity (SUVmax = 9.0). While physiologic muscular activity is commonly seen in portions of the head and neck, given the asymmetry of the findings, particularly within the left side of the oropharynx, as well as the relatively high SUV of this area direct visualization of these regions is likely warranted to exclude a second primary neoplasm.  These results were called by telephone on 06/25/2011 at 12:10 p.m. to Dr. Truett Perna (who was covering for Dr. Donnie Coffin), who verbally acknowledged these results.  3.  Status post cholecystectomy, appendectomy and hysterectomy. 4.  Colonic diverticulosis without findings to suggest acute diverticulitis.  Original Report Authenticated By: Florencia Reasons, M.D.   Dg Chest  Port 1 View  06/18/2011  *RADIOLOGY REPORT*  Clinical Data: Port-A-Cath insertion.  PORTABLE CHEST - 1 VIEW  Comparison: 07/18/2006  Findings: Right subclavian Port-A-Cath is in place with the tip in the lower SVC.  There is mild cardiomegaly with low lung volumes. No effusions or confluent airspace opacity.  IMPRESSION: Right Port-A-Cath tip in the lower SVC.  No pneumothorax.  Mild cardiomegaly.  Original Report Authenticated By: Cyndie Chime, M.D.    Assessment:  Ms. Shippee is a 51 year old [place] woman with a locally advanced clinical T3 N1 left breast carcinoma ER/PR positive at 8/9% respectively, HER-2 negative with a Ki-67 of 100%. For day 1 cycle 2 of 4 planned neoadjuvant dose dense FEC, on 07/24/2011.  Neulasta support on day  2. 2. History of severe afebrile neutropenia despite Neulasta on day 2.  Case has been reviewed with Dr. Pierce Crane.  Plan:  Percilla will return tomorrow scheduled for day 1 cycle 2 neoadjuvant dose dense FEC. She will then present on day 2 for Neulasta providing granulocytes port. Given her history of severe a febrile neutropenia, she will initiate Cipro 500 mg by mouth twice a day on 07/28/2011. Given the fact that she did have bony pain associated with the Neulasta injection, I have recommended that she start Claritin on the day of her Neulasta injection for about 5 days post. I will see her in one week's time for nadir assessment, she knows to contact us sooner if the need should arise.   This plan was reviewed with the patient, who voices understanding and agreement.  She knows to call with any changes or problems.    Tori Cupps T, PA-C 07/23/2011

## 2011-07-24 ENCOUNTER — Other Ambulatory Visit: Admitting: Lab

## 2011-07-24 ENCOUNTER — Ambulatory Visit: Admitting: Physician Assistant

## 2011-07-24 ENCOUNTER — Other Ambulatory Visit

## 2011-07-24 ENCOUNTER — Ambulatory Visit (HOSPITAL_BASED_OUTPATIENT_CLINIC_OR_DEPARTMENT_OTHER)

## 2011-07-24 VITALS — BP 102/66 | HR 67 | Temp 97.4°F

## 2011-07-24 DIAGNOSIS — C50419 Malignant neoplasm of upper-outer quadrant of unspecified female breast: Secondary | ICD-10-CM

## 2011-07-24 DIAGNOSIS — Z5111 Encounter for antineoplastic chemotherapy: Secondary | ICD-10-CM

## 2011-07-24 MED ORDER — FLUOROURACIL CHEMO INJECTION 2.5 GM/50ML
500.0000 mg/m2 | Freq: Once | INTRAVENOUS | Status: AC
  Administered 2011-07-24: 1100 mg via INTRAVENOUS
  Filled 2011-07-24: qty 22

## 2011-07-24 MED ORDER — EPIRUBICIN HCL CHEMO IV INJECTION 200 MG/100ML
100.0000 mg/m2 | Freq: Once | INTRAVENOUS | Status: AC
  Administered 2011-07-24: 222 mg via INTRAVENOUS
  Filled 2011-07-24: qty 111

## 2011-07-24 MED ORDER — SODIUM CHLORIDE 0.9 % IJ SOLN
10.0000 mL | INTRAMUSCULAR | Status: DC | PRN
  Administered 2011-07-24: 10 mL
  Filled 2011-07-24: qty 10

## 2011-07-24 MED ORDER — SODIUM CHLORIDE 0.9 % IV SOLN
500.0000 mg/m2 | Freq: Once | INTRAVENOUS | Status: AC
  Administered 2011-07-24: 1100 mg via INTRAVENOUS
  Filled 2011-07-24: qty 55

## 2011-07-24 MED ORDER — SODIUM CHLORIDE 0.9 % IV SOLN
150.0000 mg | Freq: Once | INTRAVENOUS | Status: AC
  Administered 2011-07-24: 150 mg via INTRAVENOUS
  Filled 2011-07-24: qty 5

## 2011-07-24 MED ORDER — PALONOSETRON HCL INJECTION 0.25 MG/5ML
0.2500 mg | Freq: Once | INTRAVENOUS | Status: AC
  Administered 2011-07-24: 0.25 mg via INTRAVENOUS

## 2011-07-24 MED ORDER — DEXAMETHASONE SODIUM PHOSPHATE 4 MG/ML IJ SOLN
12.0000 mg | Freq: Once | INTRAMUSCULAR | Status: AC
  Administered 2011-07-24: 12 mg via INTRAVENOUS

## 2011-07-24 MED ORDER — SODIUM CHLORIDE 0.9 % IV SOLN
Freq: Once | INTRAVENOUS | Status: AC
  Administered 2011-07-24: 09:00:00 via INTRAVENOUS

## 2011-07-24 MED ORDER — LORAZEPAM 1 MG PO TABS
0.5000 mg | ORAL_TABLET | Freq: Once | ORAL | Status: AC
  Administered 2011-07-24: 09:00:00 via ORAL

## 2011-07-24 MED ORDER — HEPARIN SOD (PORK) LOCK FLUSH 100 UNIT/ML IV SOLN
500.0000 [IU] | Freq: Once | INTRAVENOUS | Status: AC | PRN
  Administered 2011-07-24: 500 [IU]
  Filled 2011-07-24: qty 5

## 2011-07-25 ENCOUNTER — Ambulatory Visit (HOSPITAL_BASED_OUTPATIENT_CLINIC_OR_DEPARTMENT_OTHER)

## 2011-07-25 VITALS — BP 99/55 | HR 88 | Temp 97.5°F

## 2011-07-25 DIAGNOSIS — Z5189 Encounter for other specified aftercare: Secondary | ICD-10-CM

## 2011-07-25 DIAGNOSIS — C50419 Malignant neoplasm of upper-outer quadrant of unspecified female breast: Secondary | ICD-10-CM

## 2011-07-25 MED ORDER — PEGFILGRASTIM INJECTION 6 MG/0.6ML
6.0000 mg | Freq: Once | SUBCUTANEOUS | Status: AC
  Administered 2011-07-25: 6 mg via SUBCUTANEOUS
  Filled 2011-07-25: qty 0.6

## 2011-07-26 ENCOUNTER — Telehealth: Payer: Self-pay | Admitting: *Deleted

## 2011-07-26 NOTE — Telephone Encounter (Signed)
Pt. Called concerned about her blood pressure yesterday 99/55.   She has run several blood pressures that were in that range.  She denies any dizziness or change when she changes position.  She really is not having any sx, but was rather concerned about the number.  Discussed with her to drink plenty of fluids (64 oz daily of non-caffine beverages)  and to call us if she is dizzy or has problems when she changes position. Let her know that her blood pressure has been in this range before and we will check it when she comes in.  She will see Debbora Presto PA on Tuesday. 07/31/11.

## 2011-07-31 ENCOUNTER — Other Ambulatory Visit: Admitting: Lab

## 2011-07-31 ENCOUNTER — Ambulatory Visit (HOSPITAL_BASED_OUTPATIENT_CLINIC_OR_DEPARTMENT_OTHER): Admitting: Physician Assistant

## 2011-07-31 VITALS — BP 125/78 | HR 64 | Temp 98.6°F | Ht 63.0 in | Wt 232.9 lb

## 2011-07-31 DIAGNOSIS — C50419 Malignant neoplasm of upper-outer quadrant of unspecified female breast: Secondary | ICD-10-CM

## 2011-07-31 DIAGNOSIS — Z170 Estrogen receptor positive status [ER+]: Secondary | ICD-10-CM

## 2011-07-31 DIAGNOSIS — D709 Neutropenia, unspecified: Secondary | ICD-10-CM

## 2011-07-31 LAB — CBC WITH DIFFERENTIAL/PLATELET
BASO%: 0.8 % (ref 0.0–2.0)
Basophils Absolute: 0 10*3/uL (ref 0.0–0.1)
EOS%: 0 % (ref 0.0–7.0)
Eosinophils Absolute: 0 10*3/uL (ref 0.0–0.5)
HCT: 31 % — ABNORMAL LOW (ref 34.8–46.6)
HGB: 10.4 g/dL — ABNORMAL LOW (ref 11.6–15.9)
LYMPH%: 45.4 % (ref 14.0–49.7)
MCH: 28.2 pg (ref 25.1–34.0)
MCHC: 33.5 g/dL (ref 31.5–36.0)
MCV: 84 fL (ref 79.5–101.0)
MONO#: 0 10*3/uL — ABNORMAL LOW (ref 0.1–0.9)
MONO%: 3.4 % (ref 0.0–14.0)
NEUT#: 0.6 10*3/uL — ABNORMAL LOW (ref 1.5–6.5)
NEUT%: 50.4 % (ref 38.4–76.8)
Platelets: 106 10*3/uL — ABNORMAL LOW (ref 145–400)
RBC: 3.69 10*6/uL — ABNORMAL LOW (ref 3.70–5.45)
RDW: 13.6 % (ref 11.2–14.5)
WBC: 1.2 10*3/uL — ABNORMAL LOW (ref 3.9–10.3)
lymph#: 0.5 10*3/uL — ABNORMAL LOW (ref 0.9–3.3)
nRBC: 0 % (ref 0–0)

## 2011-07-31 NOTE — Progress Notes (Signed)
Hematology and Oncology Follow Up Visit  Kelsey Wilson [id number] 10-05-1960 51 y.o. 07/31/2011    HPI: Kelsey Wilson is a 51 year old [place] woman with a locally advanced clinical T3 N1 left breast carcinoma ER/PR positive at 8/9% respectively, HER-2 negative with a Ki-67 of 100%. Currently day 7 cycle 2 of 4 planned neoadjuvant dose dense FEC, on 07/24/2011 with Neulasta support on day 2. 2. History of severe afebrile neutropenia despite Neulasta on day 2. Covered prophylactically with Cipro 500 mg by mouth twice a day since 07/28/11.  Interim History:   Kelsey Wilson is seen today for followup after her second of 4 planned cycles of neoadjuvant dose dense FEC. She received Neulasta on day 2. Physically she is feeling okay. She did take Claritin for about 5 days following her Neulasta injection. She also notes that she has been utilizing Compazine more frequently this go around, and over a couple of days her face is "felt numb" she denies any difficulty swallowing. No shortness of breath or chest pain. Her hands and feet are spared.  A de she has not experienced emesis just low-grade intermittent nausea. She is having bowel movements, she denies any diarrhea. No fevers, chills, or night sweats. Emotionally she is a differential today, her maternal aunt died earlier today, she is not sure of the arrangements are being made at this time.  A detailed review of systems is otherwise noncontributory as noted below.  Review of Systems: Constitutional:  no weight loss, fever, night sweats and feels well Eyes: No complaints ENT: No complaints Cardiovascular: no chest pain or dyspnea on exertion Respiratory: no cough, shortness of breath, or wheezing Neurological: no TIA or stroke symptoms Dermatological: negative Gastrointestinal: no abdominal pain, change in bowel habits, or black or bloody stools Genito-Urinary: no dysuria, trouble voiding, or hematuria Hematological and Lymphatic:  negative Breast: known L breast cancer Musculoskeletal: negative Remaining ROS negative.   Medications:   I have reviewed the patient's current medications.  Current Outpatient Prescriptions  Medication Sig Dispense Refill  . dexamethasone (DECADRON) 4 MG tablet Take 2 tablets by mouth once a day on the day after chemotherapy and then take 2 tablets two times a day for 2 days. Take with food.  30 tablet  1  . lidocaine-prilocaine (EMLA) cream Apply topically as directed. ONE TO TWO HOURS BEFORE TREATMENT.  60 g  1  . LORazepam (ATIVAN) 0.5 MG tablet Take 1 tablet (0.5 mg total) by mouth every 6 (six) hours as needed (Nausea or vomiting).  30 tablet  0  . ondansetron (ZOFRAN) 8 MG tablet Take 1 tablet two times a day as needed for nausea or vomiting starting on the third day after chemotherapy.  30 tablet  1  . OVER THE COUNTER MEDICATION Pt. Not taking any medications      . prochlorperazine (COMPAZINE) 10 MG tablet Take 1 tablet (10 mg total) by mouth every 6 (six) hours as needed (Nausea or vomiting).  30 tablet  1  . prochlorperazine (COMPAZINE) 25 MG suppository Place 1 suppository (25 mg total) rectally every 12 (twelve) hours as needed for nausea.  12 suppository  3    Allergies:  Allergies  Allergen Reactions  . Penicillins Nausea Only    Physical Exam: Filed Vitals:   07/31/11 1312  BP: 125/78  Pulse: 64  Temp: 98.6 F (37 C)  Weight: 232 lbs. HEENT:  Sclerae anicteric, conjunctivae pink.  Oropharynx clear.  No mucositis or candidiasis.   Nodes:  No  cervical, supraclavicular, or axillary lymphadenopathy palpated.  Breast Exam:  Deferred. Lungs:  Clear to auscultation bilaterally.  No crackles, rhonchi, or wheezes.   Heart:  Regular rate and rhythm.   Abdomen:  Soft, nontender.  Positive bowel sounds.  No organomegaly or masses palpated.   Musculoskeletal:  No focal spinal tenderness to palpation.  Extremities:  Benign.  No peripheral edema or cyanosis.   Skin:   Benign.   Neuro:  Nonfocal, alert and oriented x 3.   Lab Results: Lab Results  Component Value Date   WBC 1.2* 07/31/2011   HGB 10.4* 07/31/2011   HCT 31.0* 07/31/2011   MCV 84.0 07/31/2011   PLT 106* 07/31/2011   NEUTROABS 0.6* 07/31/2011     Chemistry      Component Value Date/Time   NA 141 07/23/2011 1239   K 3.8 07/23/2011 1239   CL 105 07/23/2011 1239   CO2 26 07/23/2011 1239   BUN 15 07/23/2011 1239   CREATININE 0.79 07/23/2011 1239      Component Value Date/Time   CALCIUM 8.9 07/23/2011 1239   ALKPHOS 52 07/23/2011 1239   AST 16 07/23/2011 1239   ALT 12 07/23/2011 1239   BILITOT 0.3 07/23/2011 1239     Echocardiogram: 06/20/11 Study Conclusions - Left ventricle: The cavity size was normal. Systolic function was normal. The estimated ejection fraction was in the range of 55% to 60%. Wall motion was normal; there were no regional wall motion abnormalities. - Pulmonary arteries: PA peak pressure: 33mm Hg (S). Transthoracic echocardiography. M-mode, complete 2D, spectral Doppler, and color Doppler. Height: Height: 160cm. Height: 63in. Weight: Weight: 110.9kg. Weight: 244lb. Body mass index: BMI: 43.3kg/m^2. Body surface area: BSA: 2.81m^2. Blood pressure: 119/76. Patient status: Outpatient. Location: Echo laboratory.    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 Currently day 7 cycle 2 of 4 planned neoadjuvant dose dense FEC, on 07/24/2011.  Neulasta support on day 2. 2. Severe afebrile neutropenia despite Neulasta on day 2, currently on Cipro 500 mg by mouth twice a day.  3. Reported facial "numbness" without any focal findings. Suspect prochlorperazine.  Case has been reviewed with Dr. Pierce Crane.  Plan:  I have recommended that Kelsey Wilson not take her Compazine at this point, but utilize lorazepam at 0.25-0.5  mg if needed. She'll continue on Cipro 500 mg by mouth twice a day until 08/04/2011. She is scheduled for followup in one week's time, specifically 08/07/2011 for labs followup exam and day 1 cycle 3 neoadjuvant dose dense FEC. She knows to contact us prior if the need should arise.   This plan was reviewed with the patient, who voices understanding and agreement.  She knows to call with any changes or problems.    Jaiah Weigel T, PA-C 07/31/2011

## 2011-08-07 ENCOUNTER — Other Ambulatory Visit (HOSPITAL_BASED_OUTPATIENT_CLINIC_OR_DEPARTMENT_OTHER): Admitting: Lab

## 2011-08-07 ENCOUNTER — Ambulatory Visit (HOSPITAL_BASED_OUTPATIENT_CLINIC_OR_DEPARTMENT_OTHER)

## 2011-08-07 ENCOUNTER — Ambulatory Visit: Admitting: Physician Assistant

## 2011-08-07 VITALS — BP 121/76 | HR 73 | Temp 98.1°F | Ht 63.0 in | Wt 232.9 lb

## 2011-08-07 DIAGNOSIS — C50419 Malignant neoplasm of upper-outer quadrant of unspecified female breast: Secondary | ICD-10-CM

## 2011-08-07 DIAGNOSIS — Z170 Estrogen receptor positive status [ER+]: Secondary | ICD-10-CM

## 2011-08-07 DIAGNOSIS — Z5111 Encounter for antineoplastic chemotherapy: Secondary | ICD-10-CM

## 2011-08-07 LAB — COMPREHENSIVE METABOLIC PANEL
ALT: 16 U/L (ref 0–35)
AST: 15 U/L (ref 0–37)
Albumin: 4.1 g/dL (ref 3.5–5.2)
Alkaline Phosphatase: 78 U/L (ref 39–117)
BUN: 23 mg/dL (ref 6–23)
CO2: 26 mEq/L (ref 19–32)
Calcium: 9.4 mg/dL (ref 8.4–10.5)
Chloride: 104 mEq/L (ref 96–112)
Creatinine, Ser: 0.76 mg/dL (ref 0.50–1.10)
Glucose, Bld: 121 mg/dL — ABNORMAL HIGH (ref 70–99)
Potassium: 3.5 mEq/L (ref 3.5–5.3)
Sodium: 140 mEq/L (ref 135–145)
Total Bilirubin: 0.5 mg/dL (ref 0.3–1.2)
Total Protein: 6.3 g/dL (ref 6.0–8.3)

## 2011-08-07 LAB — CBC WITH DIFFERENTIAL/PLATELET
BASO%: 0.1 % (ref 0.0–2.0)
Basophils Absolute: 0 10*3/uL (ref 0.0–0.1)
EOS%: 0 % (ref 0.0–7.0)
Eosinophils Absolute: 0 10*3/uL (ref 0.0–0.5)
HCT: 34.8 % (ref 34.8–46.6)
HGB: 11.5 g/dL — ABNORMAL LOW (ref 11.6–15.9)
LYMPH%: 3.1 % — ABNORMAL LOW (ref 14.0–49.7)
MCH: 28.7 pg (ref 25.1–34.0)
MCHC: 33.1 g/dL (ref 31.5–36.0)
MCV: 86.6 fL (ref 79.5–101.0)
MONO#: 0.5 10*3/uL (ref 0.1–0.9)
MONO%: 1.9 % (ref 0.0–14.0)
NEUT#: 23.9 10*3/uL — ABNORMAL HIGH (ref 1.5–6.5)
NEUT%: 94.9 % — ABNORMAL HIGH (ref 38.4–76.8)
Platelets: 160 10*3/uL (ref 145–400)
RBC: 4.02 10*6/uL (ref 3.70–5.45)
RDW: 13.9 % (ref 11.2–14.5)
WBC: 25.1 10*3/uL — ABNORMAL HIGH (ref 3.9–10.3)
lymph#: 0.8 10*3/uL — ABNORMAL LOW (ref 0.9–3.3)

## 2011-08-07 LAB — LACTATE DEHYDROGENASE: LDH: 331 U/L — ABNORMAL HIGH (ref 94–250)

## 2011-08-07 MED ORDER — FLUOROURACIL CHEMO INJECTION 2.5 GM/50ML
500.0000 mg/m2 | Freq: Once | INTRAVENOUS | Status: AC
  Administered 2011-08-07: 1100 mg via INTRAVENOUS
  Filled 2011-08-07: qty 22

## 2011-08-07 MED ORDER — FOSAPREPITANT DIMEGLUMINE INJECTION 150 MG
150.0000 mg | Freq: Once | INTRAVENOUS | Status: AC
  Administered 2011-08-07: 150 mg via INTRAVENOUS
  Filled 2011-08-07: qty 5

## 2011-08-07 MED ORDER — LORAZEPAM 0.5 MG PO TABS: 0.5000 mg | ORAL_TABLET | Freq: Three times a day (TID) | ORAL | Status: AC

## 2011-08-07 MED ORDER — CYCLOPHOSPHAMIDE CHEMO INJECTION 1 GM
500.0000 mg/m2 | Freq: Once | INTRAMUSCULAR | Status: AC
  Administered 2011-08-07: 1100 mg via INTRAVENOUS
  Filled 2011-08-07: qty 55

## 2011-08-07 MED ORDER — DEXAMETHASONE SODIUM PHOSPHATE 4 MG/ML IJ SOLN
12.0000 mg | Freq: Once | INTRAMUSCULAR | Status: AC
  Administered 2011-08-07: 14:00:00 via INTRAVENOUS

## 2011-08-07 MED ORDER — PALONOSETRON HCL INJECTION 0.25 MG/5ML
0.2500 mg | Freq: Once | INTRAVENOUS | Status: AC
  Administered 2011-08-07: 0.25 mg via INTRAVENOUS

## 2011-08-07 MED ORDER — HEPARIN SOD (PORK) LOCK FLUSH 100 UNIT/ML IV SOLN
500.0000 [IU] | Freq: Once | INTRAVENOUS | Status: AC | PRN
  Administered 2011-08-07: 500 [IU]
  Filled 2011-08-07: qty 5

## 2011-08-07 MED ORDER — LORAZEPAM 1 MG PO TABS
1.0000 mg | ORAL_TABLET | Freq: Once | ORAL | Status: AC
  Administered 2011-08-07: 1 mg via ORAL

## 2011-08-07 MED ORDER — SODIUM CHLORIDE 0.9 % IJ SOLN
10.0000 mL | INTRAMUSCULAR | Status: DC | PRN
  Administered 2011-08-07: 10 mL
  Filled 2011-08-07: qty 10

## 2011-08-07 MED ORDER — DEXAMETHASONE 4 MG PO TABS: 4.0000 mg | ORAL_TABLET | Freq: Two times a day (BID) | ORAL | Status: AC

## 2011-08-07 MED ORDER — SODIUM CHLORIDE 0.9 % IV SOLN
Freq: Once | INTRAVENOUS | Status: AC
  Administered 2011-08-07: 14:00:00 via INTRAVENOUS

## 2011-08-07 MED ORDER — LORAZEPAM 1 MG PO TABS: 0.5000 mg | ORAL_TABLET | Freq: Once | ORAL | Status: DC

## 2011-08-07 MED ORDER — EPIRUBICIN HCL CHEMO IV INJECTION 200 MG/100ML
100.0000 mg/m2 | Freq: Once | INTRAVENOUS | Status: AC
  Administered 2011-08-07: 222 mg via INTRAVENOUS
  Filled 2011-08-07: qty 111

## 2011-08-07 NOTE — Progress Notes (Signed)
Hematology and Oncology Follow Up Visit  Kelsey Wilson [id number] [birth year] 51 y.o. 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 Currently day 1 cycle 3 of 4 planned neoadjuvant dose dense FEC, on 07/24/2011 with Neulasta support on day 2. 2. History of severe afebrile neutropenia despite Neulasta on day 2. Covered prophylactically with Cipro 500 mg by mouth twice a day as directed.  Interim History:   Kelsey Wilson is seen today with her mother in accompaniment for followup exam her to day 1 cycle 3 of 4 planned neoadjuvant dose dense FEC being given for a locally advanced clinical T3, N1, ER/PR positive, HER-2 negative left breast carcinoma. She is feeling okay today, though she admits she has not been sleeping well. Upon review, it appears that she has been utilizing Decadron 4 mg tablets almost on a daily basis, thinking they were her when necessary antinausea medications. She does not have a history of diabetes, serum chemistry from today is pending. Because as such, she requests a refill on her Decadron with instructions on how to take it along with a refill on her lorazepam. She denies any fevers, chills, or night sweats. She does have occasional hot flashes. No nausea, emesis, diarrhea or constipation issues. Understandably, her appetite has been excellent. She feels that her left breast mass is still present but he continues to get smaller, she occasionally has pain over the mass site.  A detailed review of systems is otherwise noncontributory as noted below.  Review of Systems: Constitutional:  no weight loss, fever, night sweats and feels well Eyes: No complaints ENT: No complaints Cardiovascular: no chest pain or dyspnea on exertion Respiratory: no cough, shortness of breath, or wheezing Neurological: no TIA or stroke  symptoms Dermatological: negative Gastrointestinal: no abdominal pain, change in bowel habits, or black or bloody stools Genito-Urinary: no dysuria, trouble voiding, or hematuria Hematological and Lymphatic: negative Breast: known L breast cancer Musculoskeletal: negative Remaining ROS negative.   Medications:   I have reviewed the patient's current medications.  Current Outpatient Prescriptions  Medication Sig Dispense Refill  . lidocaine-prilocaine (EMLA) cream Apply topically as directed. ONE TO TWO HOURS BEFORE TREATMENT.  60 g  1  . LORazepam (ATIVAN) 0.5 MG tablet Take 1 tablet (0.5 mg total) by mouth every 6 (six) hours as needed (Nausea or vomiting).  30 tablet  0  . ondansetron (ZOFRAN) 8 MG tablet Take 1 tablet two times a day as needed for nausea or vomiting starting on the third day after chemotherapy.  30 tablet  1  . OVER THE COUNTER MEDICATION Pt. Not taking any medications      . prochlorperazine (COMPAZINE) 10 MG tablet Take 1 tablet (10 mg total) by mouth every 6 (six) hours as needed (Nausea or vomiting).  30 tablet  1  . prochlorperazine (COMPAZINE) 25 MG suppository Place 1 suppository (25 mg total) rectally every 12 (twelve) hours as needed for nausea.  12 suppository  3   No current facility-administered medications for this visit.   Facility-Administered Medications Ordered in Other Visits  Medication Dose Route Frequency Provider Last Rate Last Dose  . 0.9 %  sodium chloride infusion   Intravenous Once Amada Kingfisher, PA      . cyclophosphamide (CYTOXAN) 1,100 mg in sodium chloride 0.9 % 250 mL chemo infusion  500 mg/m2 (Treatment  Plan Actual) Intravenous Once Amada Kingfisher, [place] 610 mL/hr at 08/07/11 1426 1,100 mg at 08/07/11 1426  . dexamethasone (DECADRON) injection 12 mg  12 mg Intravenous Once Amada Kingfisher, PA      . epirubicin [facility]) chemo injection 222 mg  100 mg/m2 (Treatment Plan Actual) Intravenous Once Amada Kingfisher, PA   222  mg at 08/07/11 1357  . fluorouracil (ADRUCIL) chemo injection 1,100 mg  500 mg/m2 (Treatment Plan Actual) Intravenous Once Amada Kingfisher, PA   1,100 mg at 08/07/11 1426  . fosaprepitant (EMEND) 150 mg in sodium chloride 0.9 % 145 mL IVPB  150 mg Intravenous Once Amada Kingfisher, PA 300 mL/hr at 08/07/11 1333 150 mg at 08/07/11 1333  . heparin lock flush 100 unit/mL  500 Units Intracatheter Once PRN Amada Kingfisher, PA      . LORazepam (ATIVAN) tablet 1 mg  1 mg Oral Once Amada Kingfisher, PA   1 mg at 08/07/11 1327  . palonosetron (ALOXI) injection 0.25 mg  0.25 mg Intravenous Once Amada Kingfisher, PA   0.25 mg at 08/07/11 1331  . sodium chloride 0.9 % injection 10 mL  10 mL Intracatheter PRN Amada Kingfisher, PA      . DISCONTD: LORazepam (ATIVAN) tablet 0.5 mg  0.5 mg Oral Once Pierce Crane, MD        Allergies:  Allergies  Allergen Reactions  . Penicillins Nausea Only    Physical Exam: Filed Vitals:   08/07/11 1059  BP: 121/76  Pulse: 73  Temp: 98.1 F (36.7 C)  Weight: 232 lbs. HEENT:  Sclerae anicteric, conjunctivae pink.  Oropharynx clear.  No mucositis or candidiasis.   Nodes:  No cervical, supraclavicular, or axillary lymphadenopathy palpated.  Breast Exam: The left breast was examined, there is still some thickening in the upper outer quadrant but no obvious definable mass. No evidence of palpable lymphadenopathy is frankly evident. Lungs:  Clear to auscultation bilaterally.  No crackles, rhonchi, or wheezes.   Heart:  Regular rate and rhythm.   Abdomen:  Soft, nontender.  Positive bowel sounds.  No organomegaly or masses palpated.   Musculoskeletal:  No focal spinal tenderness to palpation.  Extremities:  Benign.  No peripheral edema or cyanosis.   Skin:  Benign.   Neuro:  Nonfocal, alert and oriented x 3.   Lab Results: Lab Results  Component Value Date   WBC 25.1* 08/07/2011   HGB 11.5* 08/07/2011   HCT 34.8 08/07/2011   MCV 86.6 08/07/2011   PLT  160 08/07/2011   NEUTROABS 23.9* 08/07/2011     Chemistry      Component Value Date/Time   NA 141 07/23/2011 1239   K 3.8 07/23/2011 1239   CL 105 07/23/2011 1239   CO2 26 07/23/2011 1239   BUN 15 07/23/2011 1239   CREATININE 0.79 07/23/2011 1239      Component Value Date/Time   CALCIUM 8.9 07/23/2011 1239   ALKPHOS 52 07/23/2011 1239   AST 16 07/23/2011 1239   ALT 12 07/23/2011 1239   BILITOT 0.3 07/23/2011 1239     Echocardiogram: 06/20/11 Study Conclusions - Left ventricle: The cavity size was normal. Systolic function was normal. The estimated ejection fraction was in the range of 55% to 60%. Wall motion was normal; there were no regional wall motion abnormalities. - Pulmonary arteries: PA peak pressure: 33mm Hg (S). Transthoracic echocardiography. M-mode, complete 2D, spectral Doppler, and color Doppler. Height: Height: 160cm. Height: 63in. Weight: Weight:  110.9kg. Weight: 244lb. Body mass index: BMI: 43.3kg/m^2. Body surface area: BSA: 2.52m^2. Blood pressure: 119/76. Patient status: Outpatient. Location: Echo laboratory.    Assessment:  Ms. 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 Currently day 1 cycle 3 of 4 planned neoadjuvant dose dense FEC, on 07/24/2011.  Neulasta support on day 2. 2. History of severe afebrile neutropenia despite Neulasta on day 2, Cipro used for neutropenic coverage. 3. Reported facial "numbness" without any focal findings. Suspect prochlorperazine, has not recurred since holding such.  Case has been reviewed with Dr. Pierce Crane.  Plan:  I have provided Raymie with a refill for both her dexamethasone and lorazepam and gave her printed instructions as to how to take such. She will present tomorrow for her Neulasta injection, and in 1 week's time for nadir assessment. She will start Cipro 500 mg by mouth  twice a day on 08/11/2011. She knows to contact us prior to her followup if the need should arise.  This plan was reviewed with the patient, who voices understanding and agreement.  She knows to call with any changes or problems.    Gaspare Netzel T, PA-C 08/07/2011

## 2011-08-07 NOTE — Patient Instructions (Signed)
Pertaining to the dosing for Decadron (dexamethasone) 4mg  tabs:   - Starting tomorrow 08/08/11: take 1 (one) in the am, 1 tab in the pm   - The same as above for 3/7, and 08/10/11.  Then STOP.   You may use Lorazepam 0.5mg  tabs every 6-8 hours if needed for nausea (will help with sleep and anxiety too)  Starting on 08/11/11: take Ondansetron 8mg  tabs, every 12 hrs for a total of 3 days. (3/9, 3/10, 08/13/11)-then STOP.  Continue to HOLD Compazine, both tabs/supp.   Start Cipro 500mg  tabs on 08/11/11- take twice a day until I see you in follow-up next week.

## 2011-08-08 ENCOUNTER — Encounter: Payer: Self-pay | Admitting: Oncology

## 2011-08-08 ENCOUNTER — Ambulatory Visit (HOSPITAL_BASED_OUTPATIENT_CLINIC_OR_DEPARTMENT_OTHER)

## 2011-08-08 VITALS — BP 114/69 | HR 75 | Temp 97.2°F

## 2011-08-08 DIAGNOSIS — Z5189 Encounter for other specified aftercare: Secondary | ICD-10-CM

## 2011-08-08 DIAGNOSIS — C50419 Malignant neoplasm of upper-outer quadrant of unspecified female breast: Secondary | ICD-10-CM

## 2011-08-08 MED ORDER — PEGFILGRASTIM INJECTION 6 MG/0.6ML
6.0000 mg | Freq: Once | SUBCUTANEOUS | Status: AC
  Administered 2011-08-08: 6 mg via SUBCUTANEOUS
  Filled 2011-08-08: qty 0.6

## 2011-08-08 NOTE — Progress Notes (Signed)
Patient received two prescriptions from [facility] on 08/07/11 $6.94,her remaning balance [facility] $158.35and [facility] $600.00.

## 2011-08-13 ENCOUNTER — Other Ambulatory Visit: Payer: Self-pay | Admitting: Physician Assistant

## 2011-08-13 DIAGNOSIS — C50419 Malignant neoplasm of upper-outer quadrant of unspecified female breast: Secondary | ICD-10-CM

## 2011-08-14 ENCOUNTER — Other Ambulatory Visit (HOSPITAL_BASED_OUTPATIENT_CLINIC_OR_DEPARTMENT_OTHER)

## 2011-08-14 ENCOUNTER — Ambulatory Visit (HOSPITAL_BASED_OUTPATIENT_CLINIC_OR_DEPARTMENT_OTHER): Admitting: Physician Assistant

## 2011-08-14 VITALS — BP 127/83 | HR 86 | Temp 98.1°F | Ht 63.0 in | Wt 235.1 lb

## 2011-08-14 DIAGNOSIS — C50419 Malignant neoplasm of upper-outer quadrant of unspecified female breast: Secondary | ICD-10-CM

## 2011-08-14 DIAGNOSIS — Z170 Estrogen receptor positive status [ER+]: Secondary | ICD-10-CM

## 2011-08-14 DIAGNOSIS — C50912 Malignant neoplasm of unspecified site of left female breast: Secondary | ICD-10-CM

## 2011-08-14 DIAGNOSIS — C50919 Malignant neoplasm of unspecified site of unspecified female breast: Secondary | ICD-10-CM

## 2011-08-14 LAB — CBC WITH DIFFERENTIAL/PLATELET
BASO%: 1 % (ref 0.0–2.0)
Basophils Absolute: 0 10*3/uL (ref 0.0–0.1)
EOS%: 0 % (ref 0.0–7.0)
Eosinophils Absolute: 0 10*3/uL (ref 0.0–0.5)
HCT: 28.2 % — ABNORMAL LOW (ref 34.8–46.6)
HGB: 9.4 g/dL — ABNORMAL LOW (ref 11.6–15.9)
LYMPH%: 11.7 % — ABNORMAL LOW (ref 14.0–49.7)
MCH: 27.9 pg (ref 25.1–34.0)
MCHC: 33.3 g/dL (ref 31.5–36.0)
MCV: 83.7 fL (ref 79.5–101.0)
MONO#: 0.2 10*3/uL (ref 0.1–0.9)
MONO%: 6.8 % (ref 0.0–14.0)
NEUT#: 2.5 10*3/uL (ref 1.5–6.5)
NEUT%: 80.5 % — ABNORMAL HIGH (ref 38.4–76.8)
Platelets: 153 10*3/uL (ref 145–400)
RBC: 3.37 10*6/uL — ABNORMAL LOW (ref 3.70–5.45)
RDW: 14.6 % — ABNORMAL HIGH (ref 11.2–14.5)
WBC: 3.1 10*3/uL — ABNORMAL LOW (ref 3.9–10.3)
lymph#: 0.4 10*3/uL — ABNORMAL LOW (ref 0.9–3.3)

## 2011-08-14 NOTE — Progress Notes (Signed)
Hematology and Oncology Follow Up Visit  Kelsey Wilson [id number] 1961/04/24 51 y.o. 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 Currently day 7 cycle 3 of 4 planned neoadjuvant dose dense FEC, on 07/24/2011 with Neulasta support on day 2. 2. History of severe afebrile neutropenia despite Neulasta on day 2. Covered prophylactically with Cipro 500 mg by mouth twice a day as directed.  Interim History:   Kelsey Wilson is seen today with her mother in accompaniment for followup after her third cycle of neoadjuvant dose dense FEC. She has expected profound fatigue, but she is staying hydrated, she is trying to eat frequent small meals. She states she had one episode of low-grade fever but has had none since. She denies shortness of breath or chest pain. No diarrhea or constipation issues that she is troubled by hemorrhoids but she is working on. She has occasional low back pain. No radiation of said pain.  A detailed review of systems is otherwise noncontributory as noted below.  Review of Systems: Constitutional:  Fatigue. Eyes: No complaints ENT: No complaints Cardiovascular: no chest pain or dyspnea on exertion Respiratory: no cough, shortness of breath, or wheezing Neurological: no TIA or stroke symptoms Dermatological: negative Gastrointestinal: no abdominal pain, change in bowel habits, or black or bloody stools Genito-Urinary: no dysuria, trouble voiding, or hematuria Hematological and Lymphatic: negative Breast: known L breast cancer Musculoskeletal: negative Remaining ROS negative.   Medications:   I have reviewed the patient's current medications.  Current Outpatient Prescriptions  Medication Sig Dispense Refill  . dexamethasone (DECADRON) 4 MG tablet Take 1 tablet (4 mg total) by mouth 2 (two) times daily with a  meal.  12 tablet  0  . lidocaine-prilocaine (EMLA) cream Apply topically as directed. ONE TO TWO HOURS BEFORE TREATMENT.  60 g  1  . LORazepam (ATIVAN) 0.5 MG tablet Take 1 tablet (0.5 mg total) by mouth every 6 (six) hours as needed (Nausea or vomiting).  30 tablet  0  . LORazepam (ATIVAN) 0.5 MG tablet Take 1 tablet (0.5 mg total) by mouth every 8 (eight) hours. 1-2 po q6-8h prn nausea, anxiety, sleep  60 tablet  1  . ondansetron (ZOFRAN) 8 MG tablet Take 1 tablet two times a day as needed for nausea or vomiting starting on the third day after chemotherapy.  30 tablet  1  . OVER THE COUNTER MEDICATION Pt. Not taking any medications      . prochlorperazine (COMPAZINE) 10 MG tablet Take 1 tablet (10 mg total) by mouth every 6 (six) hours as needed (Nausea or vomiting).  30 tablet  1  . prochlorperazine (COMPAZINE) 25 MG suppository Place 1 suppository (25 mg total) rectally every 12 (twelve) hours as needed for nausea.  12 suppository  3    Allergies:  Allergies  Allergen Reactions  . Compazine Other (See Comments)    Numbness of face and lips  . Penicillins Nausea Only    Physical Exam: Filed Vitals:   08/14/11 1305  BP: 127/83  Pulse: 86  Temp: 98.1 F (36.7 C)  Weight: 235 lbs. HEENT:  Sclerae anicteric, conjunctivae pink.  Oropharynx clear.  No mucositis or candidiasis.   Nodes:  No cervical, supraclavicular, or axillary lymphadenopathy palpated.  Breast Exam: Deferred. Lungs:  Clear to auscultation bilaterally.  No crackles, rhonchi, or wheezes.   Heart:  Regular rate and rhythm.   Abdomen:  Soft, nontender.  Positive bowel sounds.  No organomegaly or masses palpated.   Musculoskeletal:  No focal spinal tenderness to palpation.  Extremities:  Benign.  No peripheral edema or cyanosis.   Skin:  Benign.   Neuro:  Nonfocal, alert and oriented x 3.   Lab Results: Lab Results  Component Value Date   WBC 3.1* 08/14/2011   HGB 9.4* 08/14/2011   HCT 28.2* 08/14/2011   MCV 83.7  08/14/2011   PLT 153 08/14/2011   NEUTROABS 2.5 08/14/2011     Chemistry      Component Value Date/Time   NA 140 08/07/2011 1041   K 3.5 08/07/2011 1041   CL 104 08/07/2011 1041   CO2 26 08/07/2011 1041   BUN 23 08/07/2011 1041   CREATININE 0.76 08/07/2011 1041      Component Value Date/Time   CALCIUM 9.4 08/07/2011 1041   ALKPHOS 78 08/07/2011 1041   AST 15 08/07/2011 1041   ALT 16 08/07/2011 1041   BILITOT 0.5 08/07/2011 1041     Echocardiogram: 06/20/11 Study Conclusions - Left ventricle: The cavity size was normal. Systolic function was normal. The estimated ejection fraction was in the range of 55% to 60%. Wall motion was normal; there were no regional wall motion abnormalities. - Pulmonary arteries: PA peak pressure: 33mm Hg (S). Transthoracic echocardiography. M-mode, complete 2D, spectral Doppler, and color Doppler. Height: Height: 160cm. Height: 63in. Weight: Weight: 110.9kg. Weight: 244lb. Body mass index: BMI: 43.3kg/m^2. Body surface area: BSA: 2.108m^2. Blood pressure: 119/76. Patient status: Outpatient. Location: Echo laboratory.    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 Currently day 7 cycle 3 of 4 planned neoadjuvant dose dense FEC, on 07/24/2011.  Neulasta support on day 2. 2. History of severe afebrile neutropenia despite Neulasta on day 2, Cipro used for neutropenic coverage, normal counts today. 3. Reported facial "numbness" without any focal findings. Suspect prochlorperazine, has not recurred since holding such.  Case has been reviewed with Dr. Pierce Crane.  Plan:   Kelsey Wilson can discontinue ciprofloxacin at this time. She was encouraged to continue stay well-hydrated and eat small frequent meals. We will see her in one week's time prior to day 1 cycle 4 of 4 planned neoadjuvant dose dense FEC, again Neulasta  utilized on day 2. She knows to contact us prior to her week followup if the need should arise. This plan was reviewed with the patient, who voices understanding and agreement.  She knows to call with any changes or problems.    Carisa Backhaus T, PA-C 08/14/2011

## 2011-08-14 NOTE — Patient Instructions (Signed)
1. STOP Cipro, your WBC (white blood cell) count is fine.  2.Stay hydrated.  3.Get plenty of rest.

## 2011-08-15 ENCOUNTER — Telehealth: Payer: Self-pay | Admitting: *Deleted

## 2011-08-15 NOTE — Telephone Encounter (Signed)
Suleyma called reporting she has felt cold today, nauseated and now has a fever of 100.8.  Asked what she should do.  Denies any other signs of infection.  Received FEC 08-04-11 and may be nadir.  Was seen yesterday with white blood cell ct. wnl and afebrile.  She is able to eat and drink fluids.  If having shaking chills with fever instructed to go to ER for further evaluation.  Instructed to treat symptoms of fever and nausea, monitor her symptoms and call with update if any changes.  Will notify providers.

## 2011-08-16 ENCOUNTER — Encounter (HOSPITAL_COMMUNITY): Payer: Self-pay | Admitting: Emergency Medicine

## 2011-08-16 ENCOUNTER — Encounter: Payer: Self-pay | Admitting: *Deleted

## 2011-08-16 ENCOUNTER — Emergency Department (HOSPITAL_COMMUNITY)
Admission: EM | Admit: 2011-08-16 | Discharge: 2011-08-16 | Disposition: A | Discharge status: Home / Self Care | Attending: Emergency Medicine | Admitting: Emergency Medicine

## 2011-08-16 ENCOUNTER — Emergency Department (HOSPITAL_COMMUNITY)

## 2011-08-16 DIAGNOSIS — R5381 Other malaise: Secondary | ICD-10-CM | POA: Insufficient documentation

## 2011-08-16 DIAGNOSIS — M545 Low back pain, unspecified: Secondary | ICD-10-CM | POA: Insufficient documentation

## 2011-08-16 DIAGNOSIS — Z79899 Other long term (current) drug therapy: Secondary | ICD-10-CM | POA: Insufficient documentation

## 2011-08-16 DIAGNOSIS — M549 Dorsalgia, unspecified: Secondary | ICD-10-CM

## 2011-08-16 DIAGNOSIS — M25559 Pain in unspecified hip: Secondary | ICD-10-CM | POA: Insufficient documentation

## 2011-08-16 DIAGNOSIS — R109 Unspecified abdominal pain: Secondary | ICD-10-CM | POA: Insufficient documentation

## 2011-08-16 DIAGNOSIS — C50919 Malignant neoplasm of unspecified site of unspecified female breast: Secondary | ICD-10-CM | POA: Insufficient documentation

## 2011-08-16 DIAGNOSIS — R10813 Right lower quadrant abdominal tenderness: Secondary | ICD-10-CM | POA: Insufficient documentation

## 2011-08-16 DIAGNOSIS — R5383 Other fatigue: Secondary | ICD-10-CM | POA: Insufficient documentation

## 2011-08-16 DIAGNOSIS — R509 Fever, unspecified: Secondary | ICD-10-CM | POA: Insufficient documentation

## 2011-08-16 DIAGNOSIS — Z9089 Acquired absence of other organs: Secondary | ICD-10-CM | POA: Insufficient documentation

## 2011-08-16 HISTORY — DX: Diverticulitis of large intestine without perforation or abscess without bleeding: K57.32

## 2011-08-16 LAB — CBC
HCT: 30.3 % — ABNORMAL LOW (ref 36.0–46.0)
Hemoglobin: 10.2 g/dL — ABNORMAL LOW (ref 12.0–15.0)
MCH: 28.3 pg (ref 26.0–34.0)
MCHC: 33.7 g/dL (ref 30.0–36.0)
MCV: 84.2 fL (ref 78.0–100.0)
Platelets: 177 10*3/uL (ref 150–400)
RBC: 3.6 MIL/uL — ABNORMAL LOW (ref 3.87–5.11)
RDW: 14.6 % (ref 11.5–15.5)
WBC: 4.8 10*3/uL (ref 4.0–10.5)

## 2011-08-16 LAB — URINALYSIS, ROUTINE W REFLEX MICROSCOPIC
Bilirubin Urine: NEGATIVE
Glucose, UA: NEGATIVE mg/dL
Hgb urine dipstick: NEGATIVE
Ketones, ur: NEGATIVE mg/dL
Leukocytes, UA: NEGATIVE
Nitrite: NEGATIVE
Protein, ur: NEGATIVE mg/dL
Specific Gravity, Urine: 1.016 (ref 1.005–1.030)
Urobilinogen, UA: 0.2 mg/dL (ref 0.0–1.0)
pH: 7.5 (ref 5.0–8.0)

## 2011-08-16 LAB — DIFFERENTIAL
Basophils Absolute: 0 10*3/uL (ref 0.0–0.1)
Basophils Relative: 1 % (ref 0–1)
Eosinophils Absolute: 0 10*3/uL (ref 0.0–0.7)
Eosinophils Relative: 0 % (ref 0–5)
Lymphocytes Relative: 11 % — ABNORMAL LOW (ref 12–46)
Lymphs Abs: 0.5 10*3/uL — ABNORMAL LOW (ref 0.7–4.0)
Monocytes Absolute: 0.6 10*3/uL (ref 0.1–1.0)
Monocytes Relative: 12 % (ref 3–12)
Neutro Abs: 3.7 10*3/uL (ref 1.7–7.7)
Neutrophils Relative %: 76 % (ref 43–77)

## 2011-08-16 LAB — COMPREHENSIVE METABOLIC PANEL
ALT: 11 U/L (ref 0–35)
AST: 14 U/L (ref 0–37)
Albumin: 3.5 g/dL (ref 3.5–5.2)
Alkaline Phosphatase: 87 U/L (ref 39–117)
BUN: 11 mg/dL (ref 6–23)
CO2: 30 mEq/L (ref 19–32)
Calcium: 9.1 mg/dL (ref 8.4–10.5)
Chloride: 98 mEq/L (ref 96–112)
Creatinine, Ser: 0.69 mg/dL (ref 0.50–1.10)
GFR calc Af Amer: >90 mL/min (ref >90)
GFR calc non Af Amer: >90 mL/min (ref >90)
Glucose, Bld: 120 mg/dL — ABNORMAL HIGH (ref 70–99)
Potassium: 3.6 mEq/L (ref 3.5–5.1)
Sodium: 135 mEq/L (ref 135–145)
Total Bilirubin: 0.4 mg/dL (ref 0.3–1.2)
Total Protein: 6 g/dL (ref 6.0–8.3)

## 2011-08-16 LAB — LIPASE, BLOOD: Lipase: 11 U/L (ref 11–59)

## 2011-08-16 LAB — PATHOLOGIST SMEAR REVIEW

## 2011-08-16 IMAGING — CT CT ABD-PELV W/ CM
1 of 3 series · 14 of 32 positions shown, 19 images · IV contrast (omnipaque)
Comparison: PET CT dated 06/25/2011

CLINICAL DATA: Abdominal pain, low back pain, breast cancer,
chemotherapy in progress

CT ABDOMEN AND PELVIS WITH CONTRAST
TECHNIQUE: Multidetector CT imaging of the abdomen and pelvis was
performed following the standard protocol during bolus
administration of intravenous contrast.
Contrast: 100mL OMNIPAQUE IOHEXOL 300 MG/ML IJ SOLN

[Series 2: abd/pel with · axial · 0.74mm/px · z∈[-355,+5]mm · 14 of 82 slices shown, 19 images]
[im 5/82  soft-tissue]
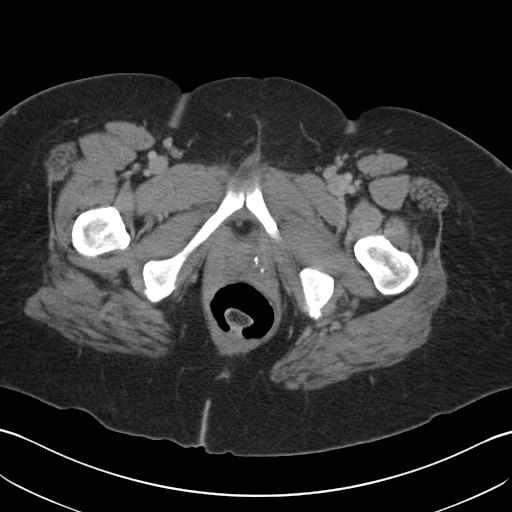
[im 5/82  bone]
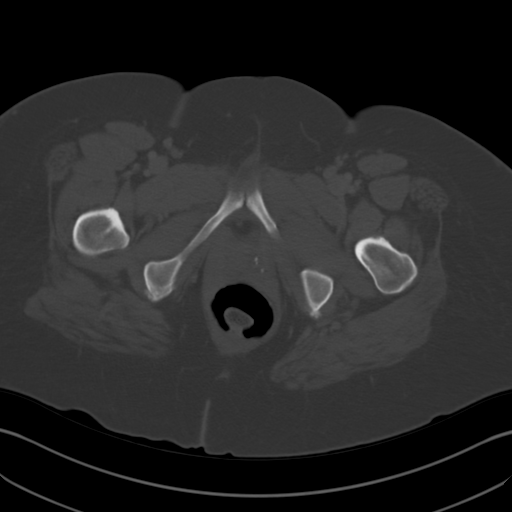
[im 10/82  soft-tissue]
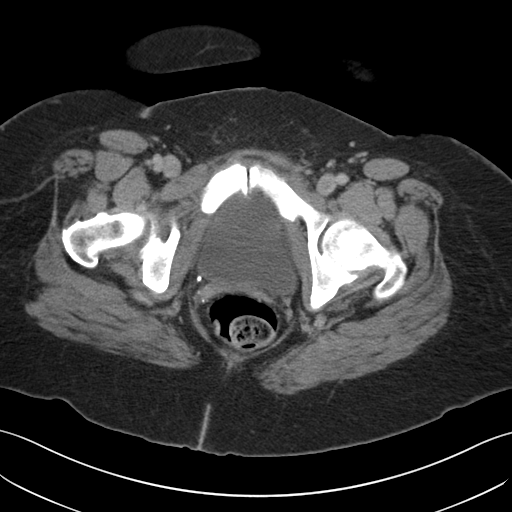
[im 19/82  soft-tissue]
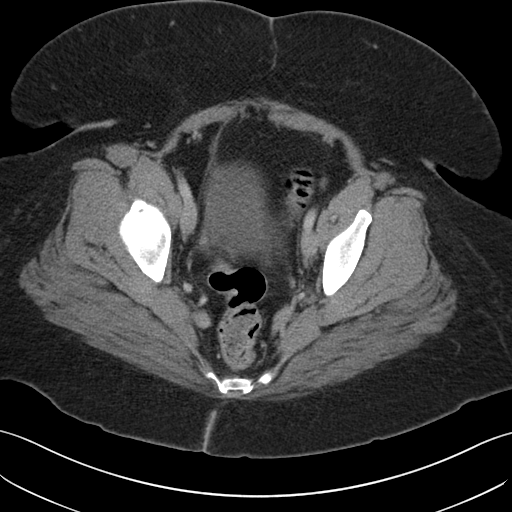
[im 23/82  soft-tissue]
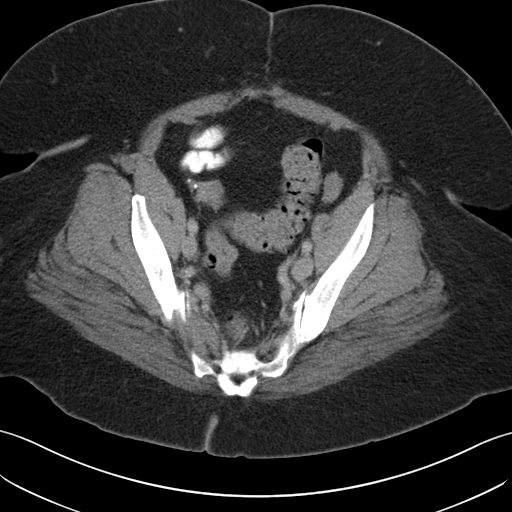
[im 28/82  soft-tissue]
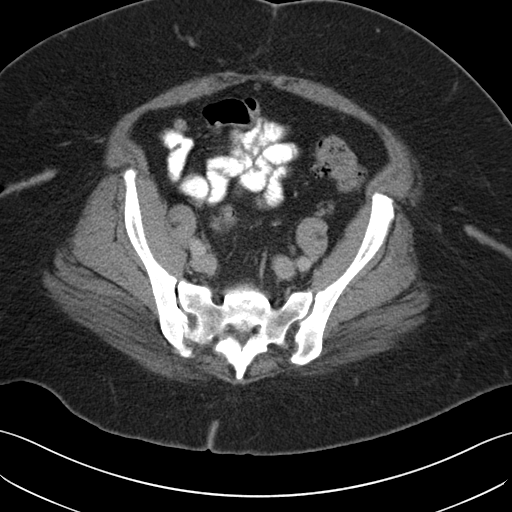
[im 37/82  soft-tissue]
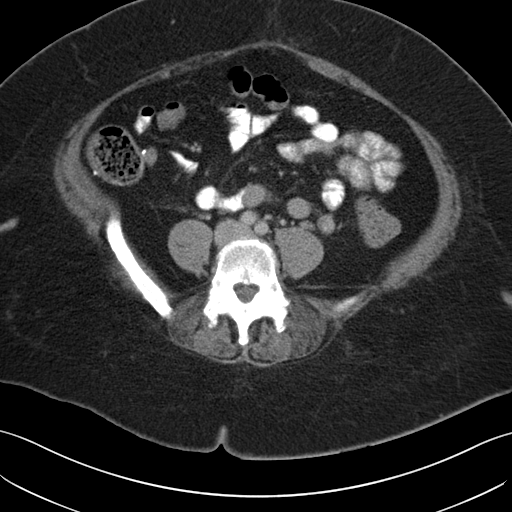
[im 41/82  soft-tissue]
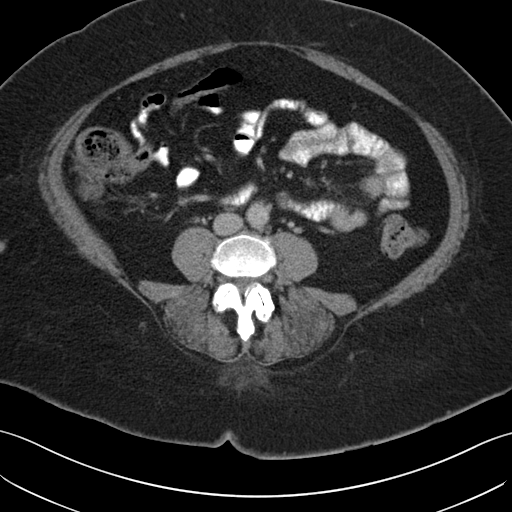
[im 46/82  soft-tissue]
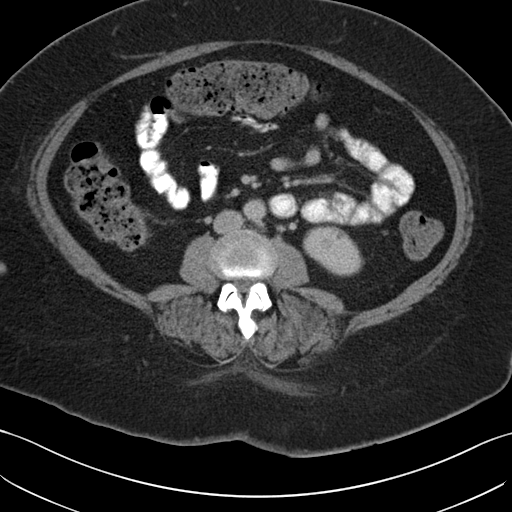
[im 55/82  soft-tissue]
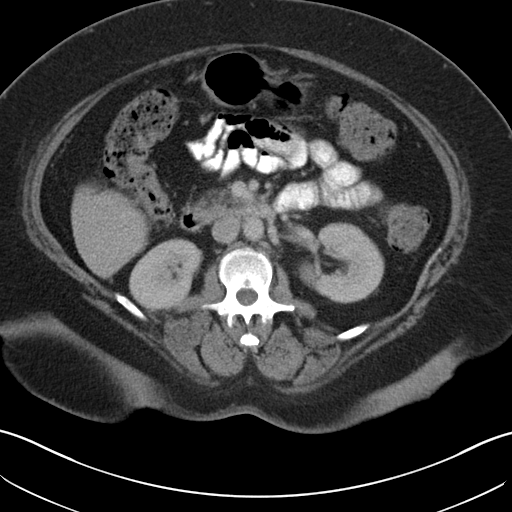
[im 55/82  bone]
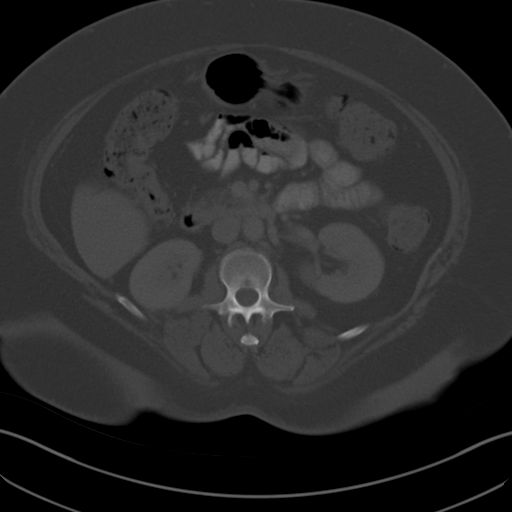
[im 59/82  soft-tissue]
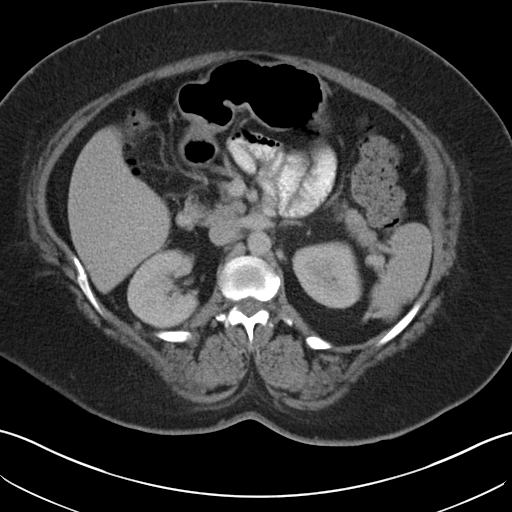
[im 64/82  soft-tissue]
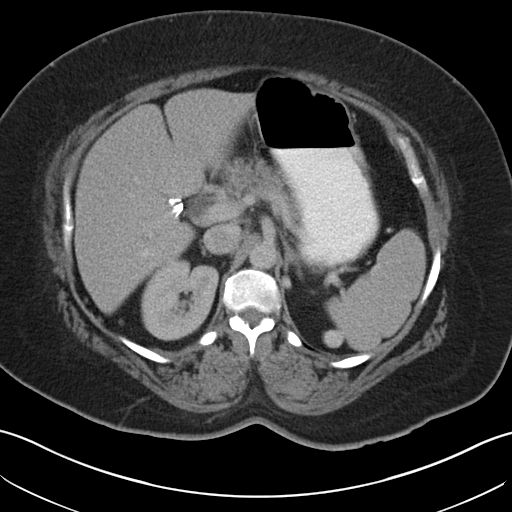
[im 64/82  lung]
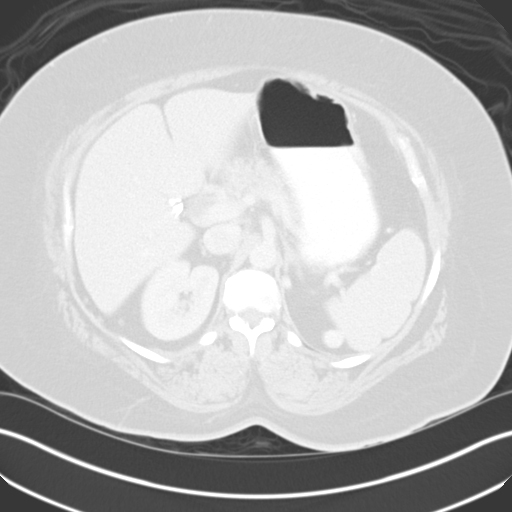
[im 68/82  lung]
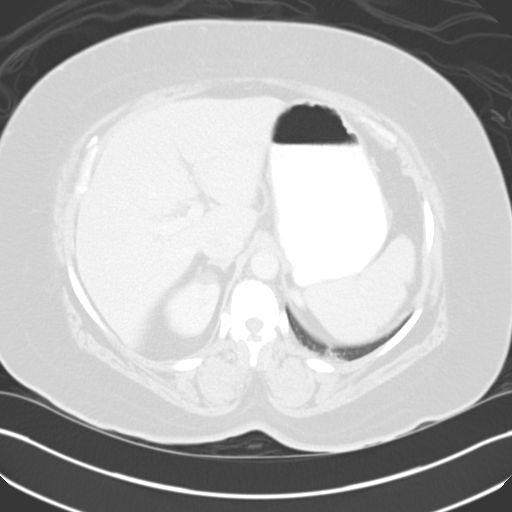
[im 73/82  soft-tissue]
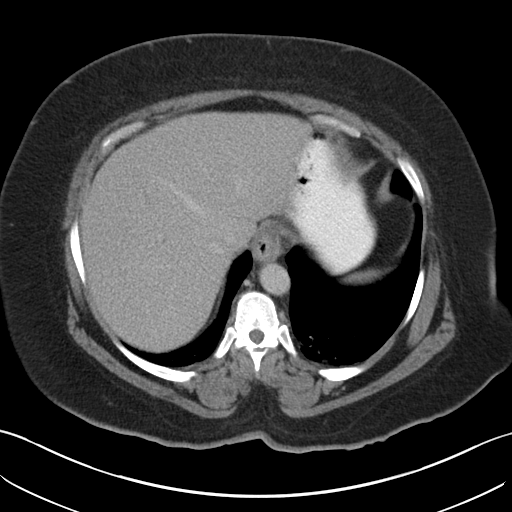
[im 73/82  lung]
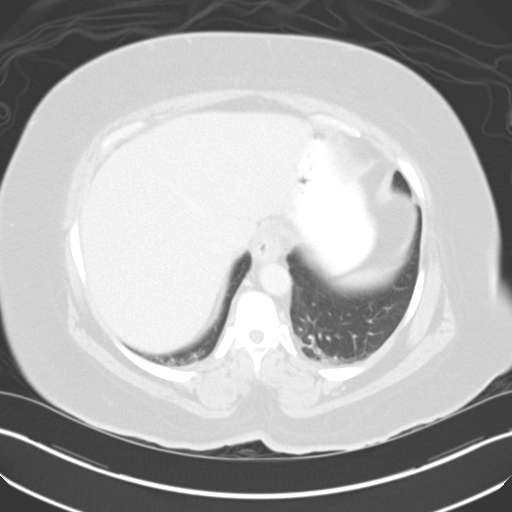
[im 77/82  soft-tissue]
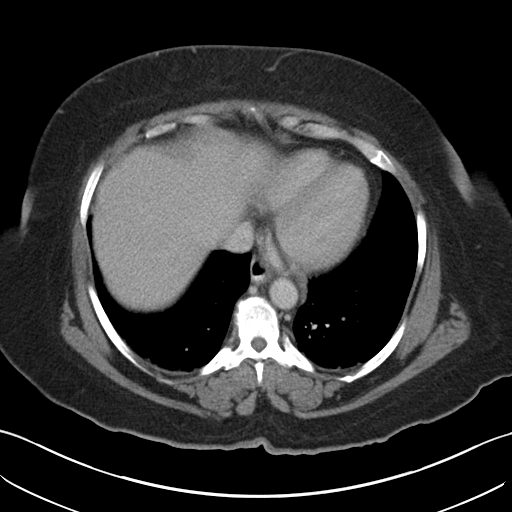
[im 77/82  lung]
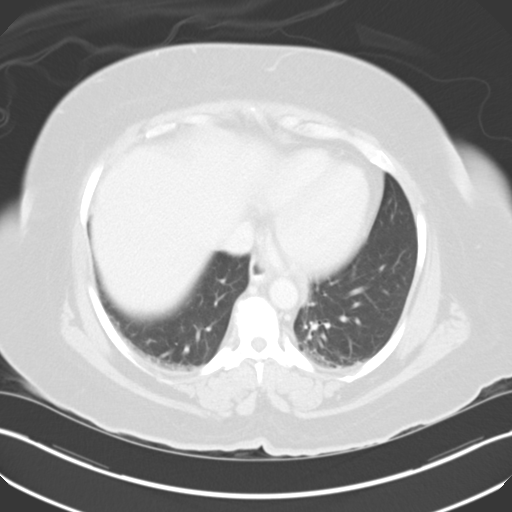

[14 of 32 positions shown; findings below may reference images not displayed]

FINDINGS: Mild dependent atelectasis at the lung bases.

Small hiatal hernia.

Liver, spleen, pancreas, and adrenal glands are within normal
limits.

Status post cholecystectomy.  No intrahepatic ductal dilatation.

Right renal cysts.  Left kidney is within normal limits.  No
hydronephrosis.

No evidence of bowel obstruction.  Prior appendectomy. Colonic
diverticulosis, without associated inflammatory changes.

No evidence of abdominal aortic aneurysm.

No suspicious abdominopelvic lymphadenopathy.

No abdominopelvic ascites.

Status post hysterectomy.  No adnexal masses.

No ureteral or bladder calculi.

Multiple calcified pelvic phleboliths.

Mild degenerative changes of the visualized thoracolumbar spine.
Stable bone island in the left iliac bone.  No suspicious osseous
lesions.
IMPRESSION: No evidence of bowel obstruction.   Prior appendectomy.

No evidence of metastatic disease in the abdomen/pelvis.

No CT findings to account for the patient's abdominal pain.

## 2011-08-16 IMAGING — CR DG HIP (WITH OR WITHOUT PELVIS) 2-3V*L*
3 series · 3 of 3 positions shown · non-contrast
Comparison: None.

CLINICAL DATA: Hip pain; on chemotherapy for breast cancer

LEFT HIP - COMPLETE 2+ VIEW

[t pelvis ap]
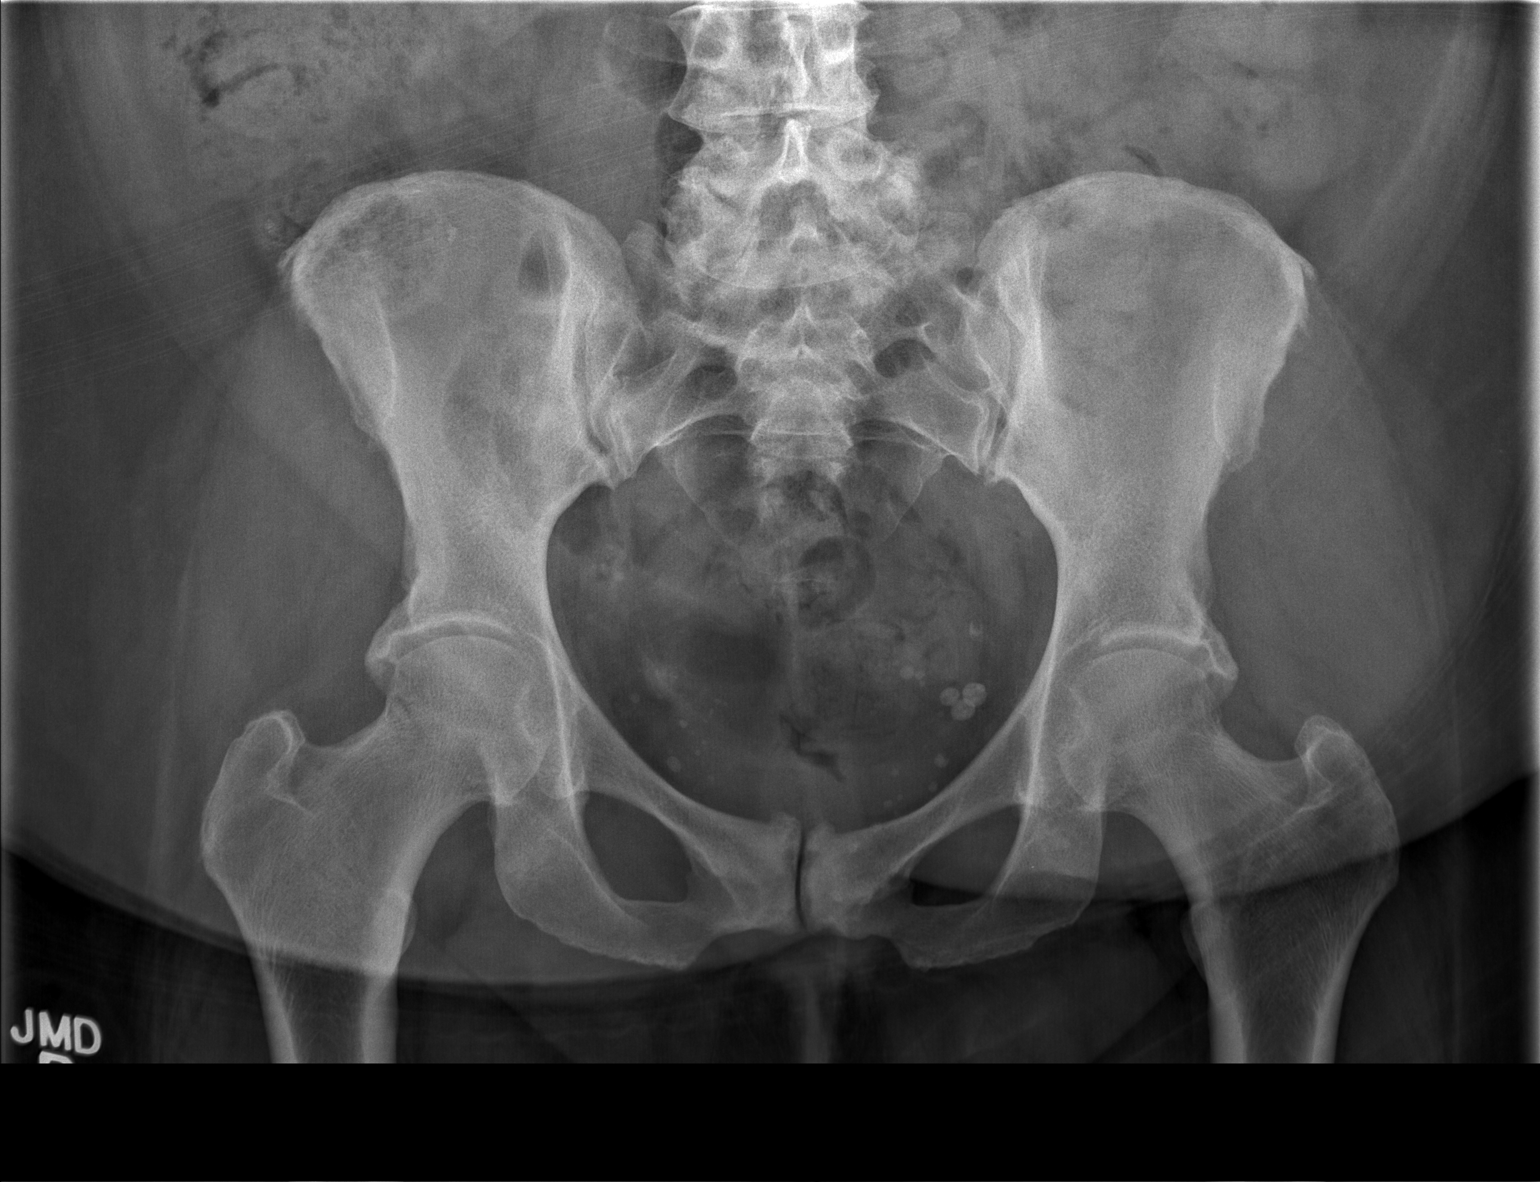

[t hip ap right]
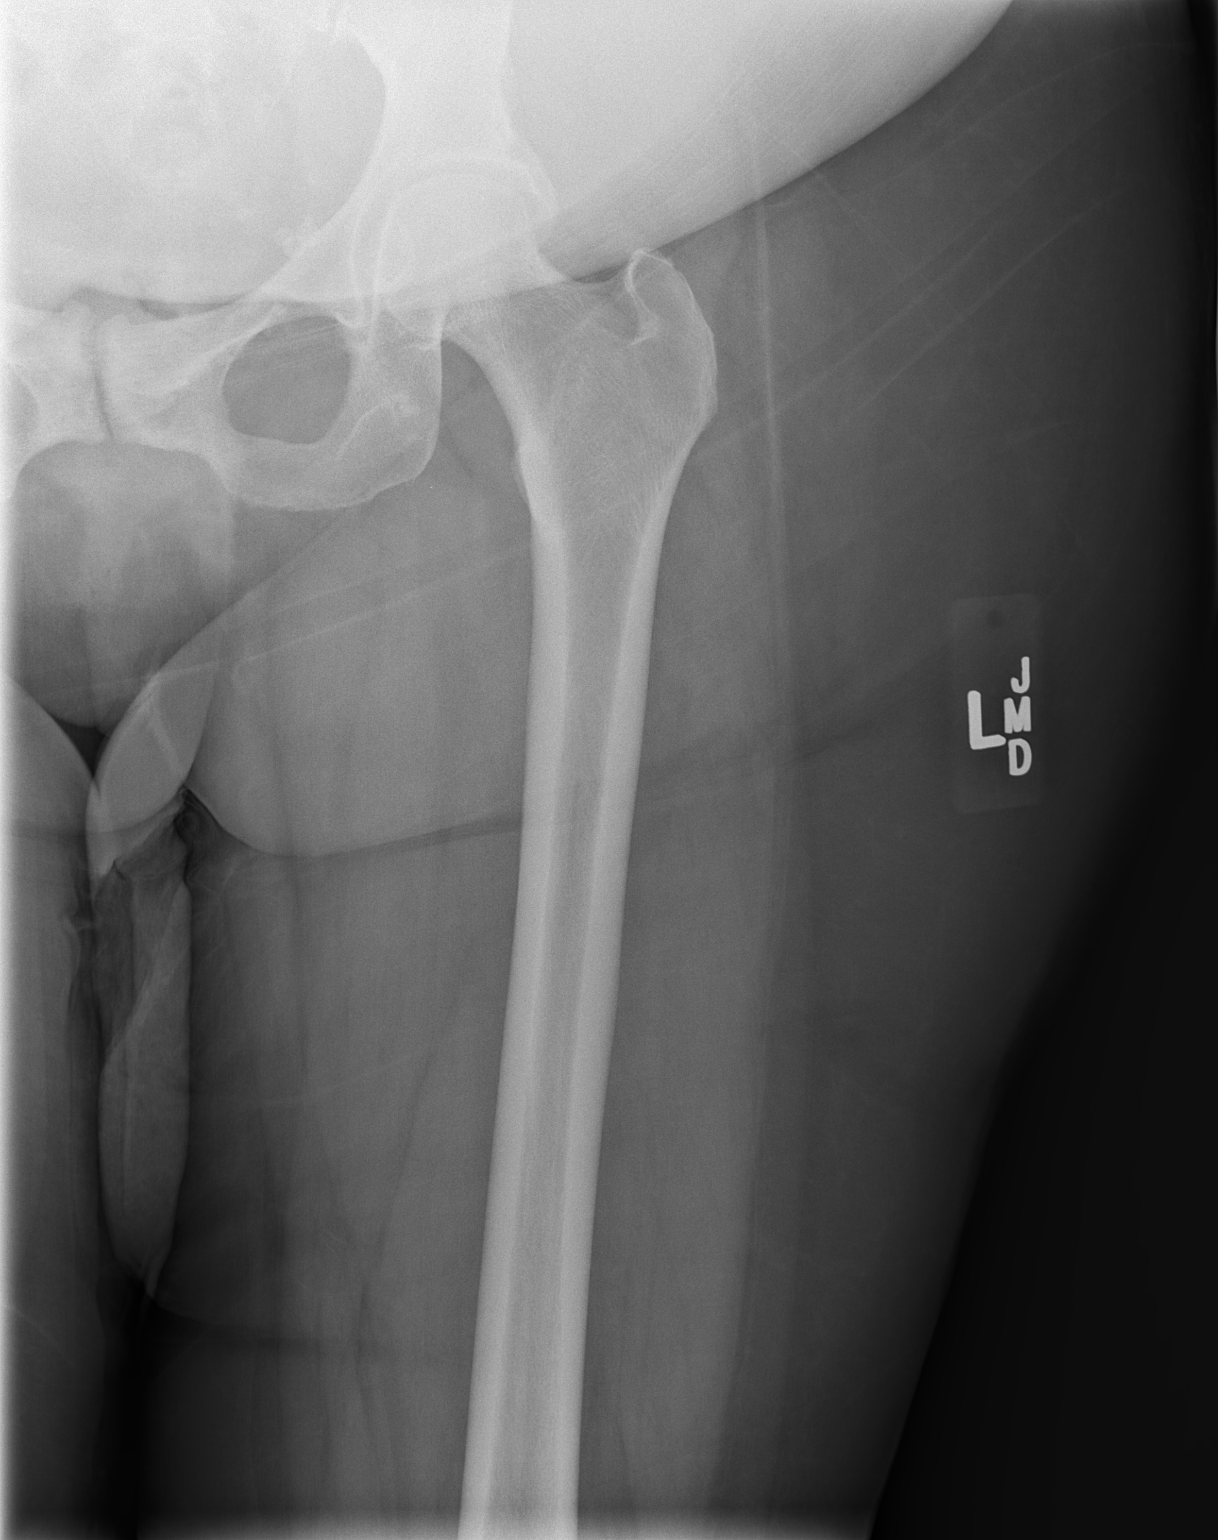

[t hip frog leg right]
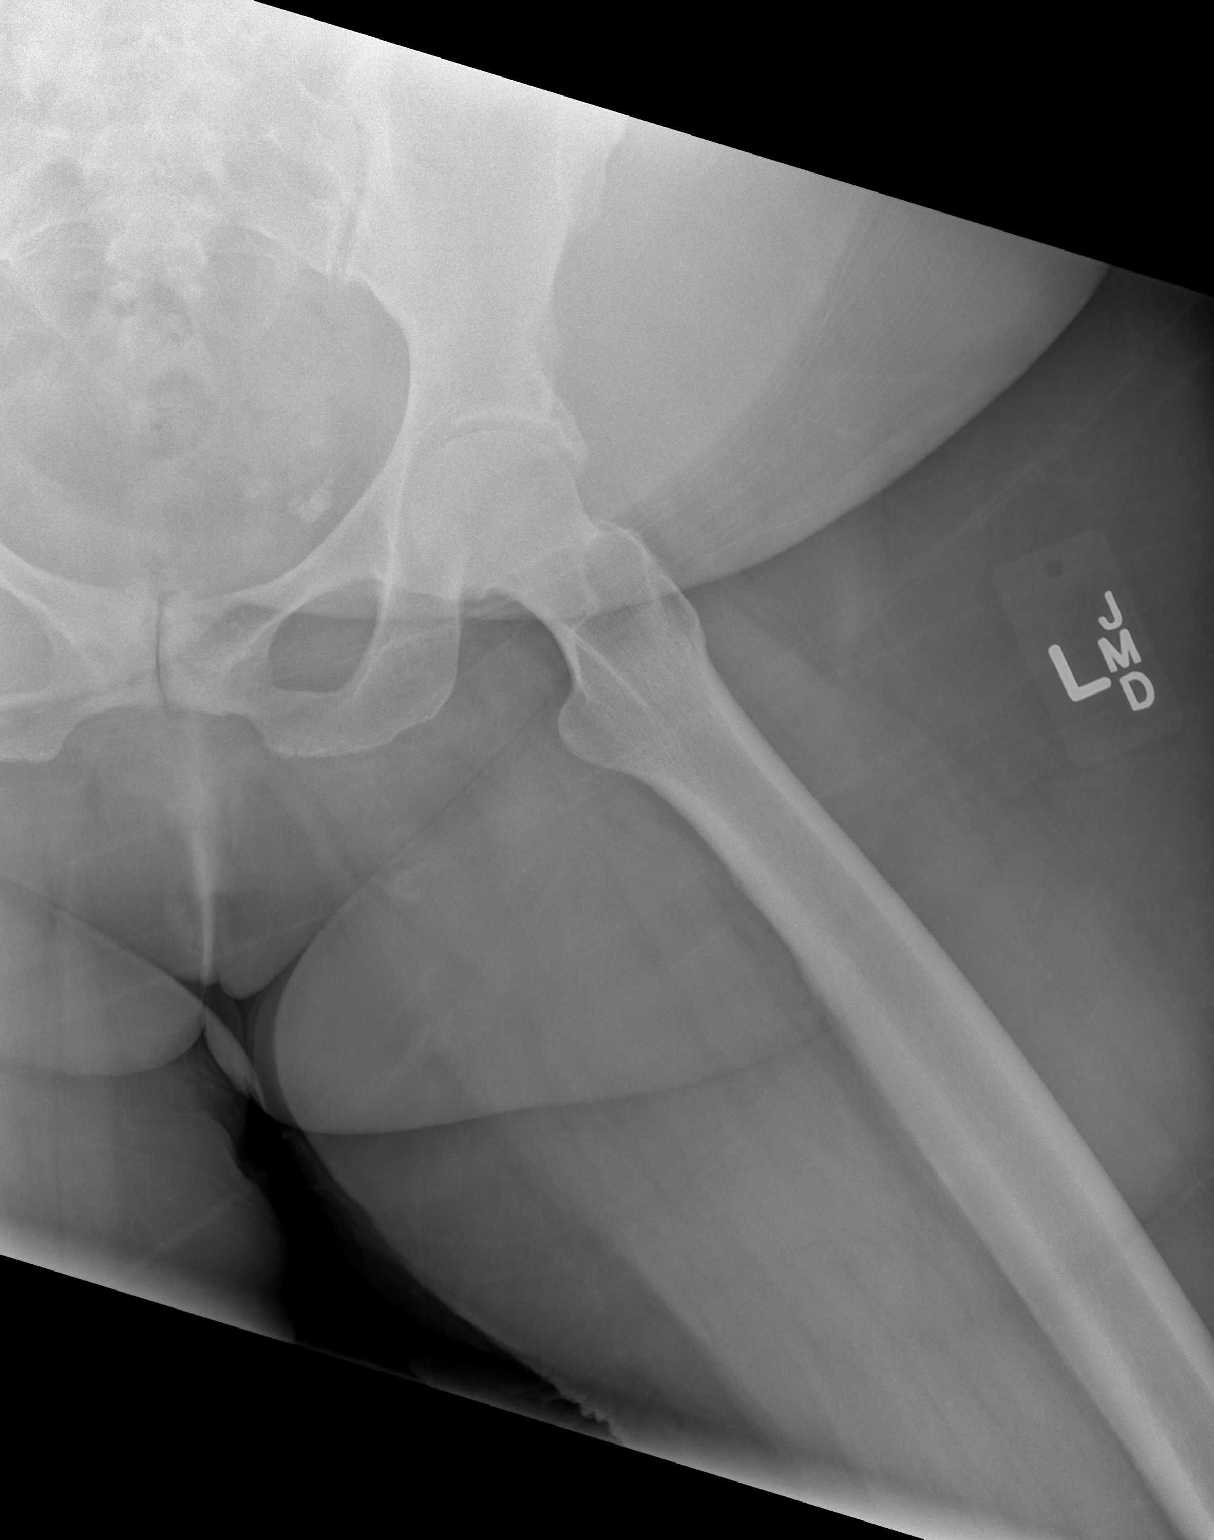

[3 of 3 positions shown; findings below may reference images not displayed]

FINDINGS: Both hip joint compartments are maintained and symmetric.
The femoral heads are well rounded bilaterally.  No osseous lesions
are identified.  There is no evidence of fracture or dislocation.
Hypertrophic degenerative changes are present at both hips, the
lower lumbar spine, and the pubic symphysis.
IMPRESSION: No acute abnormality.

## 2011-08-16 MED ORDER — HEPARIN SOD (PORK) LOCK FLUSH 100 UNIT/ML IV SOLN
500.0000 [IU] | Freq: Once | INTRAVENOUS | Status: AC
  Administered 2011-08-16: 500 [IU] via INTRAVENOUS
  Filled 2011-08-16: qty 5

## 2011-08-16 MED ORDER — HYDROMORPHONE HCL PF 1 MG/ML IJ SOLN
1.0000 mg | Freq: Once | INTRAMUSCULAR | Status: AC
  Administered 2011-08-16: 1 mg via INTRAVENOUS
  Filled 2011-08-16: qty 1

## 2011-08-16 MED ORDER — HYDROCODONE-ACETAMINOPHEN 5-325 MG PO TABS: 1.0000 | ORAL_TABLET | Freq: Four times a day (QID) | ORAL | Status: AC | PRN

## 2011-08-16 MED ORDER — ONDANSETRON 8 MG PO TBDP: 8.0000 mg | ORAL_TABLET | Freq: Three times a day (TID) | ORAL | Status: DC | PRN

## 2011-08-16 MED ORDER — ONDANSETRON 8 MG PO TBDP: 8.0000 mg | ORAL_TABLET | Freq: Three times a day (TID) | ORAL | Status: AC | PRN

## 2011-08-16 MED ORDER — ONDANSETRON HCL 4 MG/2ML IJ SOLN
4.0000 mg | Freq: Once | INTRAMUSCULAR | Status: AC
  Administered 2011-08-16: 4 mg via INTRAVENOUS
  Filled 2011-08-16: qty 2

## 2011-08-16 MED ORDER — IOHEXOL 300 MG/ML  SOLN
100.0000 mL | Freq: Once | INTRAMUSCULAR | Status: AC | PRN
  Administered 2011-08-16: 100 mL via INTRAVENOUS

## 2011-08-16 MED ORDER — SODIUM CHLORIDE 0.9 % IV SOLN
1000.0000 mL | Freq: Once | INTRAVENOUS | Status: AC
  Administered 2011-08-16: 1000 mL via INTRAVENOUS

## 2011-08-16 MED ORDER — SODIUM CHLORIDE 0.9 % IV SOLN
1000.0000 mL | INTRAVENOUS | Status: DC
  Administered 2011-08-16: 1000 mL via INTRAVENOUS

## 2011-08-16 NOTE — ED Provider Notes (Signed)
History     CSN: [id number]  Arrival date & time 08/16/11  0102   First MD Initiated Contact with Patient 08/16/11 [phone]      Chief Complaint  Patient presents with  . Hip Pain  . Abdominal Pain  . Hemorrhoids    HPI Pt states she started having hip pain 2 days ago.  She saw her doctor and was told to take tylenol but it has gotten worse.  The pain increases with movement.  She has been having fevers up to 100.8  No vomiting or diarrhea but some nausea.  Pt has breast ca, getting chemo , last treatment two weeks ago. She's never had this type of reaction with the treatments in the past.  No dysuria.  She does have a headache.  Pt incidentally has external hemorrhoids and has been trying preparation H. she has not noticed any bleeding or significant pain with hemorrhoids. She wasn't sure if that could be related to her symptoms.  Past Medical History  Diagnosis Date  . Night sweats   . Fatigue   . Wears glasses   . SOB (shortness of breath) on exertion     walking and stairts  . Arthritis   . Hot flashes   . Blood transfusion   . Diverticulitis of colon   . Cancer     left breast cancer with chemo dx 1/13    Past Surgical History  Procedure Date  . Cholecystectomy   . Appendectomy   . Carpal tunnel release     Bilateral  . Tonsillectomy   . Spur     Apex spur on both big toes  . Knee surgery     Left Knee  . Portacath placement 06/18/2011    Procedure: INSERTION PORT-A-CATH;  Surgeon: Mariella Saa, MD;  Location: [facility];  Service: General;  Laterality: Right;  right subclavian    History reviewed. No pertinent family history.  History  Substance Use Topics  . Smoking status: Never Smoker   . Smokeless tobacco: Not on file  . Alcohol Use: No    OB History    Grav Para Term Preterm Abortions TAB SAB Ect Mult Living                  Review of Systems  Constitutional: Positive for fever and fatigue.  Respiratory: Negative for cough  and shortness of breath.   Gastrointestinal: Negative for blood in stool and anal bleeding.  All other systems reviewed and are negative.    Allergies  Compazine and Penicillins  Home Medications   Current Outpatient Rx  Name Route Sig Dispense Refill  . DEXAMETHASONE 4 MG PO TABS Oral Take 1 tablet (4 mg total) by mouth 2 (two) times daily with a meal. 12 tablet 0  . LIDOCAINE-PRILOCAINE 2.5-2.5 % EX CREA Topical Apply topically as directed. ONE TO TWO HOURS BEFORE TREATMENT. 60 g 1  . LORATADINE 10 MG PO TABS Oral Take 10 mg by mouth daily.    Marland Kitchen LORAZEPAM 0.5 MG PO TABS Oral Take 1 tablet (0.5 mg total) by mouth every 8 (eight) hours. 1-2 po q6-8h prn nausea, anxiety, sleep 60 tablet 1  . ONDANSETRON HCL 8 MG PO TABS  Take 1 tablet two times a day as needed for nausea or vomiting starting on the third day after chemotherapy. 30 tablet 1    BP 133/75  Pulse 93  Temp(Src) 98.8 F (37.1 C) (Oral)  Resp 16  SpO2  100%  Physical Exam  Nursing note and vitals reviewed. Constitutional: No distress.       Obese  HENT:  Head: Normocephalic and atraumatic.  Right Ear: External ear normal.  Left Ear: External ear normal.  Eyes: Conjunctivae are normal. Right eye exhibits no discharge. Left eye exhibits no discharge. No scleral icterus.  Neck: Neck supple. No tracheal deviation present.  Cardiovascular: Normal rate, regular rhythm and intact distal pulses.   Pulmonary/Chest: Effort normal and breath sounds normal. No stridor. No respiratory distress. She has no wheezes. She has no rales.  Abdominal: Soft. Bowel sounds are normal. She exhibits no distension. There is tenderness in the right lower quadrant. There is no rebound, no guarding and no CVA tenderness. No hernia.  Musculoskeletal: She exhibits no edema and no tenderness.       Right hip: Normal.       Left hip: Normal.       Lumbar back: She exhibits tenderness. She exhibits no swelling and no edema.  Lymphadenopathy:    She  has no cervical adenopathy.  Neurological: She is alert. She has normal strength. No sensory deficit. Cranial nerve deficit:  no gross defecits noted. She exhibits normal muscle tone. She displays no seizure activity. Coordination normal.  Skin: Skin is warm and dry. No rash noted.  Psychiatric: She has a normal mood and affect.    ED Course  Procedures (including critical care time)  Labs Reviewed  CBC - Abnormal; Notable for the following:    RBC 3.60 (*)    Hemoglobin 10.2 (*)    HCT 30.3 (*)    All other components within normal limits  DIFFERENTIAL - Abnormal; Notable for the following:    Lymphocytes Relative 11 (*)    Lymphs Abs 0.5 (*)    All other components within normal limits  COMPREHENSIVE METABOLIC PANEL - Abnormal; Notable for the following:    Glucose, Bld 120 (*)    All other components within normal limits  URINALYSIS, ROUTINE W REFLEX MICROSCOPIC - Abnormal; Notable for the following:    APPearance CLOUDY (*)    All other components within normal limits  LIPASE, BLOOD  PATHOLOGIST SMEAR REVIEW   Dg Hip Complete Left  08/16/2011  *RADIOLOGY REPORT*  Clinical Data: Hip pain; on chemotherapy for breast cancer  LEFT HIP - COMPLETE 2+ VIEW  Comparison: None.  Findings: Both hip joint compartments are maintained and symmetric. The femoral heads are well rounded bilaterally.  No osseous lesions are identified.  There is no evidence of fracture or dislocation. Hypertrophic degenerative changes are present at both hips, the lower lumbar spine, and the pubic symphysis.  IMPRESSION: No acute abnormality.  Original Report Authenticated By: Brandon Melnick, M.D.   Ct Abdomen Pelvis W Contrast  08/16/2011  *RADIOLOGY REPORT*  Clinical Data: Abdominal pain, low back pain, breast cancer, chemotherapy in progress  CT ABDOMEN AND PELVIS WITH CONTRAST  Technique:  Multidetector CT imaging of the abdomen and pelvis was performed following the standard protocol during bolus  administration of intravenous contrast.  Contrast: OMNIPAQUE IOHEXOL 300 MG/ML IJ SOLN  Comparison: PET CT dated 06/25/2011  Findings: Mild dependent atelectasis at the lung bases.  Small hiatal hernia.  Liver, spleen, pancreas, and adrenal glands are within normal limits.  Status post cholecystectomy.  No intrahepatic ductal dilatation.  Right renal cysts.  Left kidney is within normal limits.  No hydronephrosis.  No evidence of bowel obstruction.  Prior appendectomy. Colonic diverticulosis, without associated inflammatory  changes.  No evidence of abdominal aortic aneurysm.  No suspicious abdominopelvic lymphadenopathy.  No abdominopelvic ascites.  Status post hysterectomy.  No adnexal masses.  No ureteral or bladder calculi.  Multiple calcified pelvic phleboliths.  Mild degenerative changes of the visualized thoracolumbar spine. Stable bone island in the left iliac bone.  No suspicious osseous lesions.  IMPRESSION: No evidence of bowel obstruction.   Prior appendectomy.  No evidence of metastatic disease in the abdomen/pelvis.  No CT findings to account for the patient's abdominal pain.  Original Report Authenticated By: Charline Bills, M.D.      MDM  I'm not certain as to the cause of the patient's back pain.  CT scan does not show any evidence of metastatic lesions or acute infection. Her laboratory tests are reassuring. There does not appear to be a urinary tract infection. She is not neutropenic. Is possible that this pain could be related to side effects from her chemotherapy. At this time however, there is no evidence of an acute emergency medical condition. Patient will be discharged home with medications for pain and nausea. Have encouraged her to return to emergency room for any worsening symptoms or other concerns. She has plans to follow up with her oncologist on Tuesday.        Celene Kras, MD 08/16/11 1200

## 2011-08-16 NOTE — ED Notes (Signed)
Pt has a lot of different C/O and was told by triage nurse yesterday to come in and be seen

## 2011-08-16 NOTE — ED Notes (Signed)
Pt returned from X-ray.  

## 2011-08-16 NOTE — Progress Notes (Unsigned)
Called pt to follow up previous the previous days complaint. Have been told by family member that pt is in the [facility]. ED.

## 2011-08-16 NOTE — ED Notes (Signed)
Patient transported to X-ray 

## 2011-08-16 NOTE — ED Notes (Signed)
MD at bedside. 

## 2011-08-16 NOTE — Discharge Instructions (Signed)
Return to emergency room for worsening symptoms. Take medications as prescribed. Followup with your doctor on Tuesday as planned.  Back Pain, Adult Low back pain is very common. About 1 in 5 people have back pain.The cause of low back pain is rarely dangerous. The pain often gets better over time.About half of people with a sudden onset of back pain feel better in just 2 weeks. About 8 in 10 people feel better by 6 weeks.  CAUSES Some common causes of back pain include:  Strain of the muscles or ligaments supporting the spine.   Wear and tear (degeneration) of the spinal discs.   Arthritis.   Direct injury to the back.  DIAGNOSIS Most of the time, the direct cause of low back pain is not known.However, back pain can be treated effectively even when the exact cause of the pain is unknown.Answering your caregiver's questions about your overall health and symptoms is one of the most accurate ways to make sure the cause of your pain is not dangerous. If your caregiver needs more information, he or she may order lab work or imaging tests (X-rays or MRIs).However, even if imaging tests show changes in your back, this usually does not require surgery. HOME CARE INSTRUCTIONS For many people, back pain returns.Since low back pain is rarely dangerous, it is often a condition that people can learn to [facility] their own.   Remain active. It is stressful on the back to sit or stand in one place. Do not sit, drive, or stand in one place for more than 30 minutes at a time. Take short walks on level surfaces as soon as pain allows.Try to increase the length of time you walk each day.   Do not stay in bed.Resting more than 1 or 2 days can delay your recovery.   Do not avoid exercise or work.Your body is made to move.It is not dangerous to be active, even though your back may hurt.Your back will likely heal faster if you return to being active before your pain is gone.   Pay attention to your body  when you bend and lift. Many people have less discomfortwhen lifting if they bend their knees, keep the load close to their bodies,and avoid twisting. Often, the most comfortable positions are those that put less stress on your recovering back.   Find a comfortable position to sleep. Use a firm mattress and lie on your side with your knees slightly bent. If you lie on your back, put a pillow under your knees.   Only take over-the-counter or prescription medicines as directed by your caregiver. Over-the-counter medicines to reduce pain and inflammation are often the most helpful.Your caregiver may prescribe muscle relaxant drugs.These medicines help dull your pain so you can more quickly return to your normal activities and healthy exercise.   Put ice on the injured area.   Put ice in a plastic bag.   Place a towel between your skin and the bag.   Leave the ice on for 15 to 20 minutes, 3 to 4 times a day for the first 2 to 3 days. After that, ice and heat may be alternated to reduce pain and spasms.   Ask your caregiver about trying back exercises and gentle massage. This may be of some benefit.   Avoid feeling anxious or stressed.Stress increases muscle tension and can worsen back pain.It is important to recognize when you are anxious or stressed and learn ways to manage it.Exercise is a great option.  SEEK MEDICAL CARE IF:  You have pain that is not relieved with rest or medicine.   You have pain that does not improve in 1 week.   You have new symptoms.   You are generally not feeling well.  SEEK IMMEDIATE MEDICAL CARE IF:   You have pain that radiates from your back into your legs.   You develop new bowel or bladder control problems.   You have unusual weakness or numbness in your arms or legs.   You develop nausea or vomiting.   You develop abdominal pain.   You feel faint.  Document Released: 05/21/2005 Document Revised: 05/10/2011 Document Reviewed:  10/09/2010 [facility] Patient Information 2012 [place], [place].

## 2011-08-16 NOTE — ED Notes (Signed)
Patient transported to CT 

## 2011-08-17 ENCOUNTER — Encounter: Payer: Self-pay | Admitting: Oncology

## 2011-08-17 ENCOUNTER — Telehealth: Payer: Self-pay

## 2011-08-17 NOTE — Telephone Encounter (Signed)
MS. Dodds CALLED STATING THAT SHE STILL HAS A TEMP TODAY OF 100.6.  T-MAX YESTERDAY WAS 102.THE NORCO TAB FOR APIN IS ONLY LASTING 4 HOURS FOR BACK PAIN SHE CAN TAKE Q 6 HRS.   THE ZOFRAN IS HELPING WITH NAUSEA.  SHE FEELS SWEATY AND WET.  SHE HAS NOT HAD CHILLS TO DAY BUT HAS HAD THEM IN LAST 48 HOURS. TOLD PT. THAT THIS INFORMATION WOULD BE RELAYED WITH THE ED REPORT FROM 08-16-11 TO DR. Donnie Coffin. IF PT. NEEDS TO COME TO THE [facility], IT WOULD TAKE HER ABOUT 90 MINUTES TO GET HERE.

## 2011-08-17 NOTE — Progress Notes (Signed)
Patient received two prescriptions from [facility] op pharmacy on 08/16/11 $14.17,her remaninig balance [facility] $144.18 and [facility]IGHT $600.00.

## 2011-08-21 ENCOUNTER — Other Ambulatory Visit: Payer: Self-pay | Admitting: Oncology

## 2011-08-21 ENCOUNTER — Encounter: Payer: Self-pay | Admitting: Physician Assistant

## 2011-08-21 ENCOUNTER — Ambulatory Visit (HOSPITAL_BASED_OUTPATIENT_CLINIC_OR_DEPARTMENT_OTHER): Admitting: Physician Assistant

## 2011-08-21 ENCOUNTER — Other Ambulatory Visit (HOSPITAL_BASED_OUTPATIENT_CLINIC_OR_DEPARTMENT_OTHER): Admitting: Lab

## 2011-08-21 ENCOUNTER — Ambulatory Visit

## 2011-08-21 VITALS — BP 119/77 | HR 84 | Temp 97.3°F | Ht 63.0 in | Wt 227.6 lb

## 2011-08-21 DIAGNOSIS — C50419 Malignant neoplasm of upper-outer quadrant of unspecified female breast: Secondary | ICD-10-CM

## 2011-08-21 DIAGNOSIS — C50919 Malignant neoplasm of unspecified site of unspecified female breast: Secondary | ICD-10-CM

## 2011-08-21 DIAGNOSIS — B029 Zoster without complications: Secondary | ICD-10-CM

## 2011-08-21 DIAGNOSIS — Z170 Estrogen receptor positive status [ER+]: Secondary | ICD-10-CM

## 2011-08-21 LAB — CBC WITH DIFFERENTIAL/PLATELET
BASO%: 1.2 % (ref 0.0–2.0)
Basophils Absolute: 0.2 10*3/uL — ABNORMAL HIGH (ref 0.0–0.1)
EOS%: 0.1 % (ref 0.0–7.0)
Eosinophils Absolute: 0 10*3/uL (ref 0.0–0.5)
HCT: 32.2 % — ABNORMAL LOW (ref 34.8–46.6)
HGB: 10.5 g/dL — ABNORMAL LOW (ref 11.6–15.9)
LYMPH%: 16.9 % (ref 14.0–49.7)
MCH: 27.8 pg (ref 25.1–34.0)
MCHC: 32.6 g/dL (ref 31.5–36.0)
MCV: 85.2 fL (ref 79.5–101.0)
MONO#: 1.3 10*3/uL — ABNORMAL HIGH (ref 0.1–0.9)
MONO%: 8.6 % (ref 0.0–14.0)
NEUT#: 10.8 10*3/uL — ABNORMAL HIGH (ref 1.5–6.5)
NEUT%: 73.2 % (ref 38.4–76.8)
Platelets: 171 10*3/uL (ref 145–400)
RBC: 3.78 10*6/uL (ref 3.70–5.45)
RDW: 15.5 % — ABNORMAL HIGH (ref 11.2–14.5)
WBC: 14.7 10*3/uL — ABNORMAL HIGH (ref 3.9–10.3)
lymph#: 2.5 10*3/uL (ref 0.9–3.3)
nRBC: 1 % — ABNORMAL HIGH (ref 0–0)

## 2011-08-21 MED ORDER — VALACYCLOVIR HCL 500 MG PO TABS: 500.0000 mg | ORAL_TABLET | Freq: Three times a day (TID) | ORAL | Status: DC

## 2011-08-22 ENCOUNTER — Encounter: Payer: Self-pay | Admitting: Oncology

## 2011-08-22 ENCOUNTER — Ambulatory Visit

## 2011-08-22 NOTE — Progress Notes (Signed)
Patient received one prescription from [place] op pharmacy on 08/21/11 $18.83,her remaninig balance [facility] $125.35 and ALIGHT $600.00.

## 2011-08-22 NOTE — Progress Notes (Signed)
Hematology and Oncology Follow Up Visit  Kelsey Wilson [id number] 12/11/1960 51 y.o. 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 Currently day 7 cycle 3 of 4 planned neoadjuvant dose dense FEC, on 07/24/2011 with Neulasta support on day 2. 2. History of severe afebrile neutropenia despite Neulasta on day 2. Covered prophylactically with Cipro 500 mg by mouth twice a day as directed.  Interim History:   Kelsey Wilson is seen today with her mother in accompaniment for followup after her third cycle of neoadjuvant dose dense FEC. She states that she went to the emergency room last Thursday due to severe pain in her left hip and thigh. She states x-rays were done and no specific cause of her pain was found. She further states that she had a fever to 100.6 on Friday escalating to a MAXIMUM TEMPERATURE of 102. She did call the physician on call him told to take Tylenol. She then noticed a rash accompanied by a burning pain in the areas where she was having pain on her left hip and thigh .  A detailed review of systems is otherwise noncontributory as noted below.  Review of Systems: Constitutional:  Fatigue. Eyes: No complaints ENT: No complaints Cardiovascular: no chest pain or dyspnea on exertion Respiratory: no cough, shortness of breath, or wheezing Neurological: no TIA or stroke symptoms Dermatological: Burning painful rash located on the left hip and thigh Gastrointestinal: no abdominal pain, change in bowel habits, or black or bloody stools Genito-Urinary: no dysuria, trouble voiding, or hematuria Hematological and Lymphatic: negative Breast: known L breast cancer Musculoskeletal: negative Remaining ROS negative.   Medications:   I have reviewed the patient's current medications.  Current Outpatient Prescriptions    Medication Sig Dispense Refill  . HYDROcodone-acetaminophen (NORCO) 5-325 MG per tablet Take 1-2 tablets by mouth every 6 (six) hours as needed for pain.  16 tablet  0  . lidocaine-prilocaine (EMLA) cream Apply topically as directed. ONE TO TWO HOURS BEFORE TREATMENT.  60 g  1  . loratadine (CLARITIN) 10 MG tablet Take 10 mg by mouth daily.      . ondansetron (ZOFRAN ODT) 8 MG disintegrating tablet Take 1 tablet (8 mg total) by mouth every 8 (eight) hours as needed for nausea.  20 tablet  0  . ondansetron (ZOFRAN) 8 MG tablet Take 1 tablet two times a day as needed for nausea or vomiting starting on the third day after chemotherapy.  30 tablet  1  . valACYclovir (VALTREX) 500 MG tablet Take 1 tablet (500 mg total) by mouth 3 (three) times daily.  21 tablet  0    Allergies:  Allergies  Allergen Reactions  . Compazine Other (See Comments)    Numbness of face and lips  . Penicillins Nausea Only    Physical Exam: Filed Vitals:   08/21/11 1358  BP: 119/77  Pulse: 84  Temp: 97.3 F (36.3 C)  Weight: 235 lbs. HEENT:  Sclerae anicteric, conjunctivae pink.  Oropharynx clear.  No mucositis or candidiasis.   Nodes:  No cervical, supraclavicular, or axillary lymphadenopathy palpated.  Breast Exam: Deferred. Lungs:  Clear to auscultation bilaterally.  No crackles, rhonchi, or wheezes.   Heart:  Regular rate and rhythm.   Abdomen:  Soft, nontender.  Positive bowel sounds.  No organomegaly or masses palpated.   Musculoskeletal:  No focal  spinal tenderness to palpation.  Extremities:  Benign.  No peripheral edema or cyanosis.   Skin:  Vesicular lesions in a linear pattern located on the left upper buttocks and left anterior thigh, no evidence of superinfection  Neuro:  Nonfocal, alert and oriented x 3.   Lab Results: Lab Results  Component Value Date   WBC 14.7* 08/21/2011   HGB 10.5* 08/21/2011   HCT 32.2* 08/21/2011   MCV 85.2 08/21/2011   PLT 171 08/21/2011   NEUTROABS 10.8* 08/21/2011      Chemistry      Component Value Date/Time   NA 135 08/16/2011 0600   K 3.6 08/16/2011 0600   CL 98 08/16/2011 0600   CO2 30 08/16/2011 0600   BUN 11 08/16/2011 0600   CREATININE 0.69 08/16/2011 0600      Component Value Date/Time   CALCIUM 9.1 08/16/2011 0600   ALKPHOS 87 08/16/2011 0600   AST 14 08/16/2011 0600   ALT 11 08/16/2011 0600   BILITOT 0.4 08/16/2011 0600     Echocardiogram: 06/20/11 Study Conclusions - Left ventricle: The cavity size was normal. Systolic function was normal. The estimated ejection fraction was in the range of 55% to 60%. Wall motion was normal; there were no regional wall motion abnormalities. - Pulmonary arteries: PA peak pressure: 33mm Hg (S). Transthoracic echocardiography. M-mode, complete 2D, spectral Doppler, and color Doppler. Height: Height: 160cm. Height: 63in. Weight: Weight: 110.9kg. Weight: 244lb. Body mass index: BMI: 43.3kg/m^2. Body surface area: BSA: 2.64m^2. Blood pressure: 119/76. Patient status: Outpatient. Location: Echo laboratory.    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 Currently day 7 cycle 3 of 4 planned neoadjuvant dose dense FEC, on 07/24/2011.  Neulasta support on day 2. 2. History of severe afebrile neutropenia despite Neulasta on day 2, Cipro used for neutropenic coverage, normal counts today. 3. herpes zoster affecting the left hip and left anterior thigh  Case has been reviewed with Dr. Pierce Crane.  Plan:   She'll be placed on Valtrex 500 mg by mouth 3 times daily for 7 days. We will postpone cycle 4 of her dose dense FEC for 1 week due to her current herpes zoster infection. She will continue taking Tylenol and hydrocodone does as needed for pain management. She knows to contact us prior to her week followup if the need should arise. This plan was  reviewed with the patient, who voices understanding and agreement.  She knows to call with any changes or problems.    Kelsey Slipper, PA-C 08/22/2011

## 2011-08-27 ENCOUNTER — Encounter: Payer: Self-pay | Admitting: Physician Assistant

## 2011-08-27 ENCOUNTER — Telehealth: Payer: Self-pay | Admitting: *Deleted

## 2011-08-27 ENCOUNTER — Ambulatory Visit (HOSPITAL_BASED_OUTPATIENT_CLINIC_OR_DEPARTMENT_OTHER): Admitting: Physician Assistant

## 2011-08-27 ENCOUNTER — Other Ambulatory Visit: Admitting: Lab

## 2011-08-27 VITALS — BP 119/75 | HR 102 | Temp 97.9°F | Ht 63.0 in | Wt 230.5 lb

## 2011-08-27 DIAGNOSIS — C50419 Malignant neoplasm of upper-outer quadrant of unspecified female breast: Secondary | ICD-10-CM

## 2011-08-27 DIAGNOSIS — B029 Zoster without complications: Secondary | ICD-10-CM

## 2011-08-27 LAB — COMPREHENSIVE METABOLIC PANEL
ALT: 14 U/L (ref 0–35)
AST: 17 U/L (ref 0–37)
Albumin: 3.9 g/dL (ref 3.5–5.2)
Alkaline Phosphatase: 44 U/L (ref 39–117)
BUN: 17 mg/dL (ref 6–23)
CO2: 31 mEq/L (ref 19–32)
Calcium: 9.4 mg/dL (ref 8.4–10.5)
Chloride: 105 mEq/L (ref 96–112)
Creatinine, Ser: 0.68 mg/dL (ref 0.50–1.10)
Glucose, Bld: 83 mg/dL (ref 70–99)
Potassium: 3.7 mEq/L (ref 3.5–5.3)
Sodium: 141 mEq/L (ref 135–145)
Total Bilirubin: 0.5 mg/dL (ref 0.3–1.2)
Total Protein: 6.3 g/dL (ref 6.0–8.3)

## 2011-08-27 LAB — CBC WITH DIFFERENTIAL/PLATELET
BASO%: 0.4 % (ref 0.0–2.0)
Basophils Absolute: 0 10*3/uL (ref 0.0–0.1)
EOS%: 0.2 % (ref 0.0–7.0)
Eosinophils Absolute: 0 10*3/uL (ref 0.0–0.5)
HCT: 31.8 % — ABNORMAL LOW (ref 34.8–46.6)
HGB: 10.7 g/dL — ABNORMAL LOW (ref 11.6–15.9)
LYMPH%: 22.4 % (ref 14.0–49.7)
MCH: 29.7 pg (ref 25.1–34.0)
MCHC: 33.7 g/dL (ref 31.5–36.0)
MCV: 88 fL (ref 79.5–101.0)
MONO#: 0.7 10*3/uL (ref 0.1–0.9)
MONO%: 15.7 % — ABNORMAL HIGH (ref 0.0–14.0)
NEUT#: 2.7 10*3/uL (ref 1.5–6.5)
NEUT%: 61.3 % (ref 38.4–76.8)
Platelets: 325 10*3/uL (ref 145–400)
RBC: 3.61 10*6/uL — ABNORMAL LOW (ref 3.70–5.45)
RDW: 16.9 % — ABNORMAL HIGH (ref 11.2–14.5)
WBC: 4.4 10*3/uL (ref 3.9–10.3)
lymph#: 1 10*3/uL (ref 0.9–3.3)

## 2011-08-27 NOTE — Patient Instructions (Signed)
We are delaying chemo today until as least 08/30/11 due to the area on your L upper thigh. --apply a topical antibiotic ointment (Bactroban, Neosporin etc.) to this region, an apply a "light" Telfa pad over area to protect it.  2.) After completing 7 days of Valtrex at 500mg  three time per day, start taking Valtrex 500mg  twice a day to maintain suppression of shingles.  3.) Start Neurontin (gabapentin) 100mg  three times per day--may start with 1, and increase to three times IF the medicine makes you sleepy-which it can:)      This is being given to help with the pain from the shingles called Post-Herpetic Neuralgia.  4.) Prescription for Vicodin 5/500 for pain also provided if needed.

## 2011-08-27 NOTE — Progress Notes (Signed)
Hematology and Oncology Follow Up Visit  Kelsey Wilson [id number] 1961-04-17 51 y.o. 08/27/2011    HPI: 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 For day 1 cycle 4 of 4 planned neoadjuvant dose dense FEC, after 1 week delay due to herpes zoster of the left buttock and left upper thigh region. Neulasta support planned on and in in day 2. 2. History of severe afebrile neutropenia despite Neulasta on day 2, Cipro used for neutropenic coverage, normal counts today. 3. Herpes zoster of the left buttock and left upper thigh region on Valtrex 500 mg by mouth 3 times a day, Norco 5/325 for when necessary pain requirements.   Interim History:   Kelsey Wilson is seen today with her mother in accompaniment for followup in anticipation of day 1 cycle 4 of 4 planned neoadjuvant dose dense FEC which was delayed for one week due to herpes zoster of the left buttock and left upper leg region. She has been taking Valtrex 500 mg by mouth 3 times a day, as at this area is improving, though she still has a region of her left upper thigh anteriorly which is quite painful. She denies any fevers, chills, or night sweats. No shortness of breath or chest pain. She denies any nausea or emesis issues. No chronic constipation. A detailed review of systems is otherwise noncontributory as noted below.  Review of Systems: Constitutional:  Fatigue. Eyes: No complaints ENT: No complaints Cardiovascular: no chest pain or dyspnea on exertion Respiratory: no cough, shortness of breath, or wheezing Neurological: no TIA or stroke symptoms Dermatological: negative Gastrointestinal: no abdominal pain, change in bowel habits, or black or bloody stools Genito-Urinary: no dysuria, trouble voiding, or hematuria Hematological and Lymphatic: negative Breast: known L breast  cancer Musculoskeletal: Pain on the left upper thigh region associated with known herpes zoster. Remaining ROS negative.   Medications:   I have reviewed the patient's current medications.  Current Outpatient Prescriptions  Medication Sig Dispense Refill  . HYDROcodone-acetaminophen (NORCO) 5-325 MG per tablet Take 1-2 tablets by mouth every 6 (six) hours as needed for pain.  16 tablet  0  . lidocaine-prilocaine (EMLA) cream Apply topically as directed. ONE TO TWO HOURS BEFORE TREATMENT.  60 g  1  . loratadine (CLARITIN) 10 MG tablet Take 10 mg by mouth daily.      . ondansetron (ZOFRAN) 8 MG tablet Take 1 tablet two times a day as needed for nausea or vomiting starting on the third day after chemotherapy.  30 tablet  1  . valACYclovir (VALTREX) 500 MG tablet Take 1 tablet (500 mg total) by mouth 3 (three) times daily.  21 tablet  0    Allergies:  Allergies  Allergen Reactions  . Compazine Other (See Comments)    Numbness of face and lips  . Penicillins Nausea Only    Physical Exam: Filed Vitals:   08/27/11 1124  BP: 119/75  Pulse: 102  Temp: 97.9 F (36.6 C)  Weight: 230 lbs. HEENT:  Sclerae anicteric, conjunctivae pink.  Oropharynx clear.  No mucositis or candidiasis.   Nodes:  No cervical, supraclavicular, or axillary lymphadenopathy palpated.  Breast Exam: Deferred. Lungs:  Clear to auscultation bilaterally.  No crackles, rhonchi, or wheezes.   Heart:  Regular rate and rhythm.   Abdomen:  Soft, nontender.  Positive bowel sounds.  No organomegaly or masses palpated.  Musculoskeletal:  No focal spinal tenderness to palpation.  Extremities:  Benign.  No peripheral edema or cyanosis.   Skin: The region along her left buttock is starting to clear nicely, but the area on her left upper thigh anteriorly show some areas of breakdown which are worrisome. Neuro:  Nonfocal, alert and oriented x 3.   Lab Results: Lab Results  Component Value Date   WBC 4.4 08/27/2011   HGB  10.7* 08/27/2011   HCT 31.8* 08/27/2011   MCV 88.0 08/27/2011   PLT 325 08/27/2011   NEUTROABS 2.7 08/27/2011     Chemistry      Component Value Date/Time   NA 135 08/16/2011 0600   K 3.6 08/16/2011 0600   CL 98 08/16/2011 0600   CO2 30 08/16/2011 0600   BUN 11 08/16/2011 0600   CREATININE 0.69 08/16/2011 0600      Component Value Date/Time   CALCIUM 9.1 08/16/2011 0600   ALKPHOS 87 08/16/2011 0600   AST 14 08/16/2011 0600   ALT 11 08/16/2011 0600   BILITOT 0.4 08/16/2011 0600       Assessment:  Ms. 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 For day 1 cycle 4 of 4 planned neoadjuvant dose dense FEC, after 1 week delay due to herpes zoster of the left buttock and left upper thigh region. Neulasta support planned on and in in day 2. 2. History of severe afebrile neutropenia despite Neulasta on day 2, Cipro used for neutropenic coverage, normal counts today. 3. Herpes zoster of the left buttock and left upper thigh region on Valtrex 500 mg by mouth 3 times a day, Norco 5/325 for when necessary pain requirements.  4. Symptoms suggesting postherpetic neuralgia.  Case has been reviewed with Dr. Pierce Crane.  Plan:  I will hold initiating cycle 4 of her neoadjuvant dose dense FEC today given the fact that the region and her left upper thigh he still looks a bit angry, with some erythema around where the lesions have started to dry. There is actually some evidence of skin breakdown. Therefore, I have recommended that she utilize a topical antibiotic with a light dressing, along with continuing her Valtrex 500 mg by mouth twice a day, after she has completed the 3 times a day dosing regimen. We will also initiate Neurontin 100 mg by mouth 3 times a day for post herpetic neuralgia symptoms. We will regroup on 08/30/2011 for a brief followup to assess this region,  if things are looking well we will proceed with treatment, again Neulasta support on day 2.  This plan was reviewed with the patient, who voices understanding and agreement.  She knows to call with any changes or problems.    Alani Lacivita T, PA-C 08/27/2011

## 2011-08-27 NOTE — Telephone Encounter (Signed)
sent email to [facility] to add patient on for 08-30-2011 three hour chemo

## 2011-08-28 ENCOUNTER — Ambulatory Visit

## 2011-08-28 ENCOUNTER — Other Ambulatory Visit: Payer: Self-pay | Admitting: Physician Assistant

## 2011-08-28 ENCOUNTER — Encounter: Payer: Self-pay | Admitting: Oncology

## 2011-08-28 NOTE — Progress Notes (Signed)
Patient received three prescriptions from [facility] on 08/27/11 $65.16,her remaninig balance [facility] $60.19 and [facility] $600.00.

## 2011-08-29 ENCOUNTER — Other Ambulatory Visit: Payer: Self-pay | Admitting: *Deleted

## 2011-08-30 ENCOUNTER — Other Ambulatory Visit (HOSPITAL_BASED_OUTPATIENT_CLINIC_OR_DEPARTMENT_OTHER): Admitting: Lab

## 2011-08-30 ENCOUNTER — Ambulatory Visit: Admitting: Physician Assistant

## 2011-08-30 ENCOUNTER — Other Ambulatory Visit: Payer: Self-pay | Admitting: Oncology

## 2011-08-30 ENCOUNTER — Ambulatory Visit (HOSPITAL_BASED_OUTPATIENT_CLINIC_OR_DEPARTMENT_OTHER)

## 2011-08-30 VITALS — BP 114/74 | HR 82 | Temp 98.6°F | Ht 63.0 in | Wt 229.9 lb

## 2011-08-30 DIAGNOSIS — C50419 Malignant neoplasm of upper-outer quadrant of unspecified female breast: Secondary | ICD-10-CM

## 2011-08-30 DIAGNOSIS — Z5111 Encounter for antineoplastic chemotherapy: Secondary | ICD-10-CM

## 2011-08-30 LAB — COMPREHENSIVE METABOLIC PANEL
ALT: 13 U/L (ref 0–35)
AST: 17 U/L (ref 0–37)
Albumin: 3.9 g/dL (ref 3.5–5.2)
Alkaline Phosphatase: 47 U/L (ref 39–117)
BUN: 12 mg/dL (ref 6–23)
CO2: 30 mEq/L (ref 19–32)
Calcium: 9.5 mg/dL (ref 8.4–10.5)
Chloride: 104 mEq/L (ref 96–112)
Creatinine, Ser: 0.62 mg/dL (ref 0.50–1.10)
Glucose, Bld: 94 mg/dL (ref 70–99)
Potassium: 3.8 mEq/L (ref 3.5–5.3)
Sodium: 142 mEq/L (ref 135–145)
Total Bilirubin: 0.6 mg/dL (ref 0.3–1.2)
Total Protein: 6.3 g/dL (ref 6.0–8.3)

## 2011-08-30 LAB — CBC WITH DIFFERENTIAL/PLATELET
BASO%: 1.1 % (ref 0.0–2.0)
Basophils Absolute: 0 10*3/uL (ref 0.0–0.1)
EOS%: 0.8 % (ref 0.0–7.0)
Eosinophils Absolute: 0 10*3/uL (ref 0.0–0.5)
HCT: 31.7 % — ABNORMAL LOW (ref 34.8–46.6)
HGB: 10.5 g/dL — ABNORMAL LOW (ref 11.6–15.9)
LYMPH%: 30.9 % (ref 14.0–49.7)
MCH: 28.4 pg (ref 25.1–34.0)
MCHC: 33.1 g/dL (ref 31.5–36.0)
MCV: 85.7 fL (ref 79.5–101.0)
MONO#: 0.8 10*3/uL (ref 0.1–0.9)
MONO%: 21 % — ABNORMAL HIGH (ref 0.0–14.0)
NEUT#: 1.7 10*3/uL (ref 1.5–6.5)
NEUT%: 46.2 % (ref 38.4–76.8)
Platelets: 322 10*3/uL (ref 145–400)
RBC: 3.7 10*6/uL (ref 3.70–5.45)
RDW: 16.6 % — ABNORMAL HIGH (ref 11.2–14.5)
WBC: 3.6 10*3/uL — ABNORMAL LOW (ref 3.9–10.3)
lymph#: 1.1 10*3/uL (ref 0.9–3.3)

## 2011-08-30 MED ORDER — PALONOSETRON HCL INJECTION 0.25 MG/5ML
0.2500 mg | Freq: Once | INTRAVENOUS | Status: AC
  Administered 2011-08-30: 0.25 mg via INTRAVENOUS

## 2011-08-30 MED ORDER — LORAZEPAM 2 MG/ML IJ SOLN
0.5000 mg | Freq: Once | INTRAMUSCULAR | Status: AC
  Administered 2011-08-30: 0.5 mg via INTRAVENOUS

## 2011-08-30 MED ORDER — SODIUM CHLORIDE 0.9 % IV SOLN
500.0000 mg/m2 | Freq: Once | INTRAVENOUS | Status: AC
  Administered 2011-08-30: 1100 mg via INTRAVENOUS
  Filled 2011-08-30: qty 55

## 2011-08-30 MED ORDER — EPIRUBICIN HCL CHEMO IV INJECTION 200 MG/100ML
100.0000 mg/m2 | Freq: Once | INTRAVENOUS | Status: AC
  Administered 2011-08-30: 222 mg via INTRAVENOUS
  Filled 2011-08-30: qty 111

## 2011-08-30 MED ORDER — HEPARIN SOD (PORK) LOCK FLUSH 100 UNIT/ML IV SOLN
500.0000 [IU] | Freq: Once | INTRAVENOUS | Status: AC | PRN
  Administered 2011-08-30: 500 [IU]
  Filled 2011-08-30: qty 5

## 2011-08-30 MED ORDER — FLUOROURACIL CHEMO INJECTION 2.5 GM/50ML
500.0000 mg/m2 | Freq: Once | INTRAVENOUS | Status: AC
  Administered 2011-08-30: 1100 mg via INTRAVENOUS
  Filled 2011-08-30: qty 22

## 2011-08-30 MED ORDER — DEXAMETHASONE SODIUM PHOSPHATE 4 MG/ML IJ SOLN
12.0000 mg | Freq: Once | INTRAMUSCULAR | Status: AC
  Administered 2011-08-30: 12 mg via INTRAVENOUS

## 2011-08-30 MED ORDER — SODIUM CHLORIDE 0.9 % IV SOLN
150.0000 mg | Freq: Once | INTRAVENOUS | Status: AC
  Administered 2011-08-30: 150 mg via INTRAVENOUS
  Filled 2011-08-30: qty 5

## 2011-08-30 MED ORDER — SODIUM CHLORIDE 0.9 % IJ SOLN
10.0000 mL | INTRAMUSCULAR | Status: DC | PRN
  Administered 2011-08-30: 10 mL
  Filled 2011-08-30: qty 10

## 2011-08-30 MED ORDER — SODIUM CHLORIDE 0.9 % IV SOLN
Freq: Once | INTRAVENOUS | Status: AC
  Administered 2011-08-30: 14:00:00 via INTRAVENOUS

## 2011-08-30 NOTE — Patient Instructions (Addendum)
Please continue Valtrex 500mg  twice, resume Gabapentin 100mg  three times per day.             These are both to help ease the pain along the shingles area, and to prevent it from reoccuring.  2. Please start Cipro 500mg  twice a day 09/04/11  3. Take Claritin prior to Neulasta tomorrow.  4. Use Lorazepam 0.5mg  as directed if needed. Again, take Decadron 4mg  tabs, one in am, one in pm for three days after chemo.

## 2011-08-31 ENCOUNTER — Telehealth: Payer: Self-pay | Admitting: Oncology

## 2011-08-31 ENCOUNTER — Other Ambulatory Visit: Payer: Self-pay | Admitting: Oncology

## 2011-08-31 ENCOUNTER — Ambulatory Visit (HOSPITAL_BASED_OUTPATIENT_CLINIC_OR_DEPARTMENT_OTHER)

## 2011-08-31 VITALS — BP 124/70 | HR 85 | Temp 98.1°F

## 2011-08-31 DIAGNOSIS — C50419 Malignant neoplasm of upper-outer quadrant of unspecified female breast: Secondary | ICD-10-CM

## 2011-08-31 DIAGNOSIS — Z5189 Encounter for other specified aftercare: Secondary | ICD-10-CM

## 2011-08-31 MED ORDER — PEGFILGRASTIM INJECTION 6 MG/0.6ML
6.0000 mg | Freq: Once | SUBCUTANEOUS | Status: AC
  Administered 2011-08-31: 6 mg via SUBCUTANEOUS
  Filled 2011-08-31: qty 0.6

## 2011-08-31 NOTE — Progress Notes (Signed)
Hematology and Oncology Follow Up Visit  KEIANNA SIGNER [id number] Dec 30, 1960 51 y.o. 08/30/11   HPI: Ms. 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 For day 1 cycle 4 of 4 planned neoadjuvant dose dense FEC, after 10 day delay due to herpes zoster of the left buttock and left upper thigh region. Neulasta support planned on and in in day 2. 2. History of severe afebrile neutropenia despite Neulasta on day 2, Cipro used for neutropenic coverage, normal counts today. 3. Herpes zoster of the left buttock and left upper thigh region on Valtrex 500 mg by mouth 3 times a day, Norco 5/325 for when necessary pain requirements.   Interim History:   Malori is seen today with her mother in accompaniment for followup in anticipation of day 1 cycle 4 of 4 planned neoadjuvant dose dense FEC which was delayed for ten days due to herpes zoster of the left buttock and left upper leg region. She has been taking Valtrex 500 mg by mouth 3 times a day, and has been initiated on gabapentin 100 mg 3 times a day for post herpetic neuralgia which has improved. She also feels that the region on the anterior left upper thigh has improved significantly. She is here today to assess this region prior to initiating treatment. Otherwise, she denies any fevers, chills, or night sweats. No shortness of breath or chest pain. She denies any nausea or emesis issues. No chronic constipation. A detailed review of systems is otherwise noncontributory as noted below.  Review of Systems: Constitutional:  Fatigue. Eyes: No complaints ENT: No complaints Cardiovascular: no chest pain or dyspnea on exertion Respiratory: no cough, shortness of breath, or wheezing Neurological: no TIA or stroke symptoms Dermatological: negative Gastrointestinal: no abdominal pain, change in bowel habits, or  black or bloody stools Genito-Urinary: no dysuria, trouble voiding, or hematuria Hematological and Lymphatic: negative Breast: known L breast cancer Musculoskeletal: Pain on the left upper thigh region associated with known herpes zoster, improved with gabapentin. Remaining ROS negative.   Medications:   I have reviewed the patient's current medications.  Current Outpatient Prescriptions  Medication Sig Dispense Refill  . dexamethasone (DECADRON) 4 MG tablet Take 4 mg by mouth 2 (two) times daily with a meal.      . gabapentin (NEURONTIN) 100 MG capsule Take 100 mg by mouth 3 (three) times daily.      Marland Kitchen HYDROcodone-acetaminophen (VICODIN) 5-500 MG per tablet Take 1 tablet by mouth every 6 (six) hours as needed. 1 po q6-8h prn pain  #90 w/1 RF, 08/27/11-CTS      . lidocaine-prilocaine (EMLA) cream Apply topically as directed. ONE TO TWO HOURS BEFORE TREATMENT.  60 g  1  . loratadine (CLARITIN) 10 MG tablet Take 10 mg by mouth daily.      Marland Kitchen LORazepam (ATIVAN) 0.5 MG tablet Take 0.5 mg by mouth every 8 (eight) hours.      . ondansetron (ZOFRAN) 8 MG tablet Take 1 tablet two times a day as needed for nausea or vomiting starting on the third day after chemotherapy.  30 tablet  1  . valACYclovir (VALTREX) 500 MG tablet Take 500 mg by mouth 2 (two) times daily.      Marland Kitchen DISCONTD: prochlorperazine (COMPAZINE) 10 MG tablet Take 1 tablet (10 mg total) by mouth every 6 (six) hours as needed (Nausea or vomiting).  30  tablet  1  . DISCONTD: prochlorperazine (COMPAZINE) 25 MG suppository Place 1 suppository (25 mg total) rectally every 12 (twelve) hours as needed for nausea.  12 suppository  3   No current facility-administered medications for this visit.   Facility-Administered Medications Ordered in Other Visits  Medication Dose Route Frequency Provider Last Rate Last Dose  . 0.9 %  sodium chloride infusion   Intravenous Once Amada Kingfisher, PA      . cyclophosphamide (CYTOXAN) 1,100 mg in sodium  chloride 0.9 % 250 mL chemo infusion  500 mg/m2 (Treatment Plan Actual) Intravenous Once Amada Kingfisher, PA   1,100 mg at 08/30/11 1554  . dexamethasone (DECADRON) injection 12 mg  12 mg Intravenous Once Amada Kingfisher, PA   12 mg at 08/30/11 1430  . epirubicin (ELLENCE) chemo injection 222 mg  100 mg/m2 (Treatment Plan Actual) Intravenous Once Amada Kingfisher, PA   222 mg at 08/30/11 1527  . fluorouracil (ADRUCIL) chemo injection 1,100 mg  500 mg/m2 (Treatment Plan Actual) Intravenous Once Amada Kingfisher, PA   1,100 mg at 08/30/11 1554  . fosaprepitant (EMEND) 150 mg in sodium chloride 0.9 % 145 mL IVPB  150 mg Intravenous Once Amada Kingfisher, PA   150 mg at 08/30/11 1429  . heparin lock flush 100 unit/mL  500 Units Intracatheter Once PRN Amada Kingfisher, PA   500 Units at 08/30/11 1724  . LORazepam (ATIVAN) injection 0.5 mg  0.5 mg Intravenous Once Amada Kingfisher, PA   0.5 mg at 08/30/11 1409  . palonosetron (ALOXI) injection 0.25 mg  0.25 mg Intravenous Once Amada Kingfisher, PA   0.25 mg at 08/30/11 1414  . DISCONTD: sodium chloride 0.9 % injection 10 mL  10 mL Intracatheter PRN Amada Kingfisher, PA   10 mL at 08/30/11 1724    Allergies:  Allergies  Allergen Reactions  . Compazine Other (See Comments)    Numbness of face and lips  . Penicillins Nausea Only    Physical Exam: Filed Vitals:   08/30/11 1151  BP: 114/74  Pulse: 82  Temp: 98.6 F (37 C)  Weight: 229 lbs.  Full physical exam deferred, the region of her left upper thigh anteriorly he looks much better and is drying and well with healing.  Lab Results: Lab Results  Component Value Date   WBC 3.6* 08/30/2011   HGB 10.5* 08/30/2011   HCT 31.7* 08/30/2011   MCV 85.7 08/30/2011   PLT 322 08/30/2011   NEUTROABS 1.7 08/30/2011     Chemistry      Component Value Date/Time   NA 142 08/30/2011 1050   K 3.8 08/30/2011 1050   CL 104 08/30/2011 1050   CO2 30 08/30/2011 1050   BUN 12  08/30/2011 1050   CREATININE 0.62 08/30/2011 1050      Component Value Date/Time   CALCIUM 9.5 08/30/2011 1050   ALKPHOS 47 08/30/2011 1050   AST 17 08/30/2011 1050   ALT 13 08/30/2011 1050   BILITOT 0.6 08/30/2011 1050       Assessment:  Ms. 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 For day 1 cycle 4 of 4 planned neoadjuvant dose dense FEC, after 10 day delay due to herpes zoster of the left buttock and left upper thigh region. Neulasta support planned on and in in day 2. 2.  History of severe afebrile neutropenia despite Neulasta on day 2, Cipro used for neutropenic coverage, normal counts today. 3. Herpes zoster of the left buttock and left upper thigh region on Valtrex 500 mg by mouth 3 times a day, Norco 5/325 for when necessary pain requirements.  4. Symptoms suggesting postherpetic neuralgia.  Case has been reviewed with Dr. Pierce Crane.  Plan:  We will proceed with day 1 cycle 4 of 4 planned neoadjuvant dose dense FEC. today as scheduled. Neulasta support on day 2. Dannika will also be scheduled for interim MRI of the breast and followup with her surgeon. She will continue on Valtrex 500 mg by mouth twice a day, she will continue on gabapentin 100 mg by mouth 3 times a day for post herpetic neuralgia. She will initiate Cipro 500 mg by mouth twice a day on 09/04/2011, followup in one week's time for nadir assessment. She and her mother and to contact us sooner if the need should arise.  This plan was reviewed with the patient, who voices understanding and agreement.  She knows to call with any changes or problems.    Chyrl Elwell T, PA-C 08/30/11

## 2011-08-31 NOTE — Telephone Encounter (Signed)
gve the pt her April 2013 appt calendar along with the mri appt. Pt is aware we will call her back with the appt with dr Johna Sheriff.

## 2011-09-03 ENCOUNTER — Telehealth: Payer: Self-pay | Admitting: Oncology

## 2011-09-03 ENCOUNTER — Other Ambulatory Visit: Payer: Self-pay | Admitting: Certified Registered Nurse Anesthetist

## 2011-09-03 NOTE — Telephone Encounter (Signed)
S/w beth from dr hoxworth's office to schedule the pt for her port check. appt has been made for may 2013. S/w the pt and she is aware of the d/t of the appt.

## 2011-09-05 ENCOUNTER — Ambulatory Visit (HOSPITAL_COMMUNITY)
Admission: RE | Admit: 2011-09-05 | Discharge: 2011-09-05 | Disposition: A | Discharge status: Home / Self Care | Source: Ambulatory Visit | Attending: Physician Assistant | Admitting: Physician Assistant

## 2011-09-05 DIAGNOSIS — C50419 Malignant neoplasm of upper-outer quadrant of unspecified female breast: Secondary | ICD-10-CM | POA: Insufficient documentation

## 2011-09-05 IMAGING — MR MR BREAST BILATERAL W WO CONTRAST
5 of 13 series · 18 of 48 positions shown · IV contrast (Yes)
Comparison: MRI 06/08/2011, mammogram and ultrasound 06/01/2011
and earlierfrom the [REDACTED]

CLINICAL DATA: The patient is undergoing neoadjuvant treatment for
known left breast cancer.

BILATERAL BREAST MRI WITH AND WITHOUT CONTRAST
TECHNIQUE: Multiplanar, multisequence MR images of both breasts
were obtained prior to and following the intravenous administration
of 20ml of Multihance.  Three dimensional images were evaluated at
the independent Dyn[employer] workstation.

[Series 3: T2 · axial · 3.0mm · 0.66mm/px · 1 of 72 slices shown]
[im 1/72]
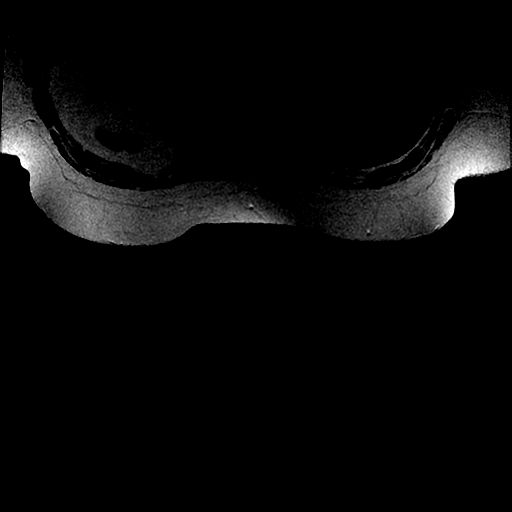

[Series 5: ax ir · axial · 3.0mm · 0.66mm/px · 1 of 72 slices shown]
[im 1/72]
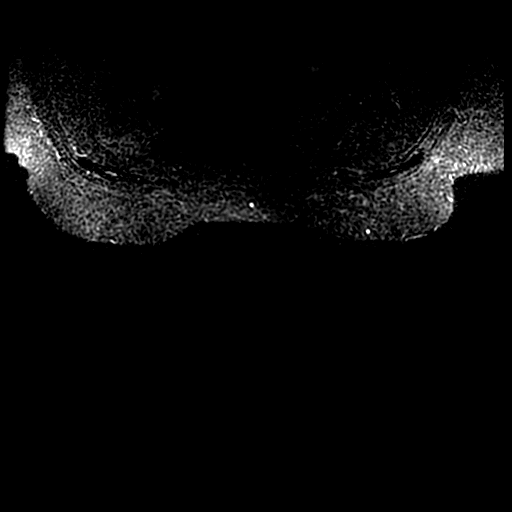

[Series 700: 20ml vibrant mph · axial · 1.8mm · 0.66mm/px · z∈[-113,+102]mm · 5 of 240 slices shown]
[im 1/240]
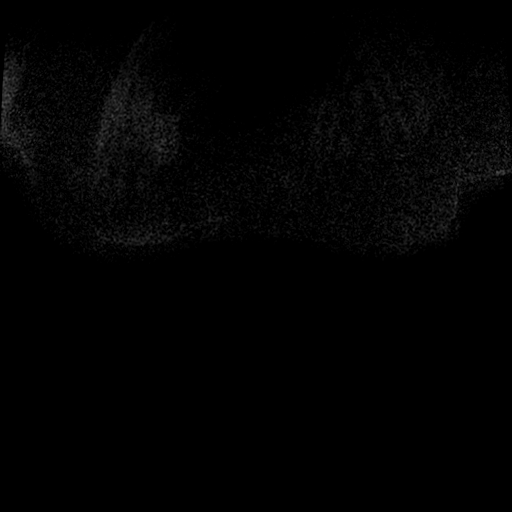
[im 60/240]
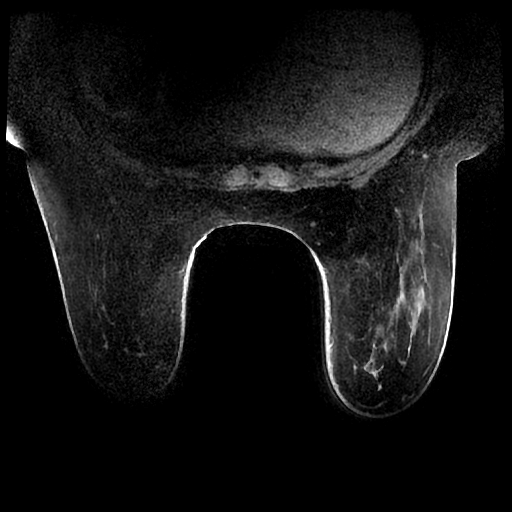
[im 120/240]
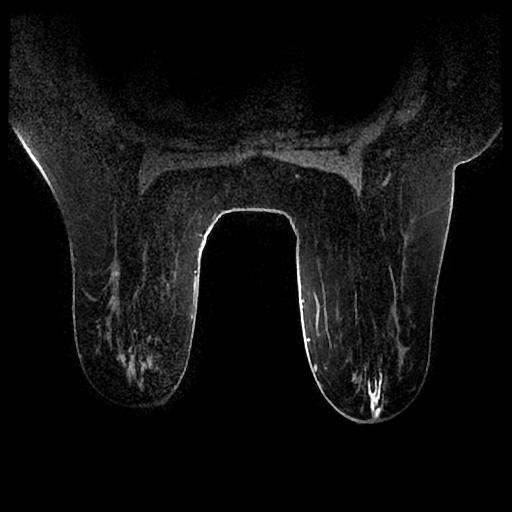
[im 180/240]
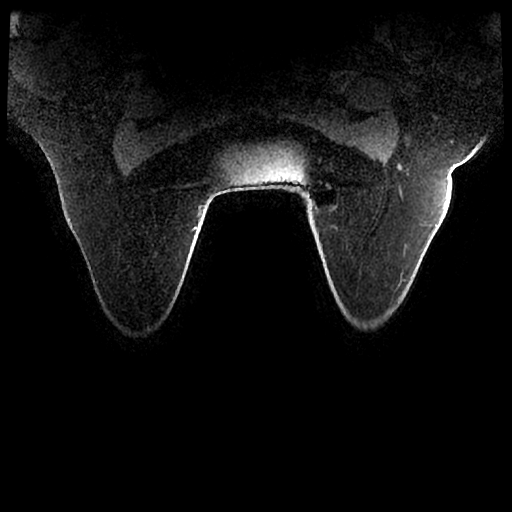
[im 240/240]
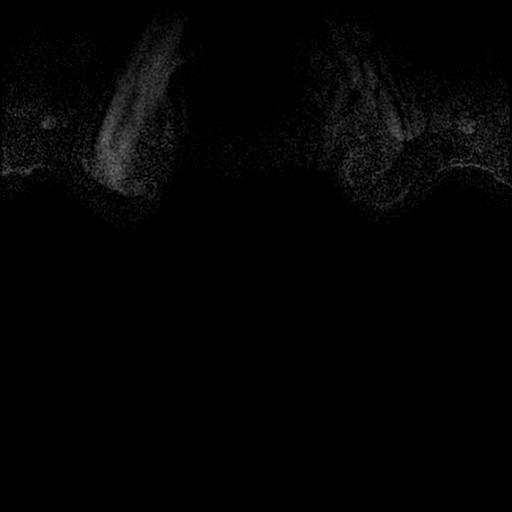

[Series 701: ph1/20ml vibrant mph · axial · 1.8mm · 0.66mm/px · z∈[-113,+102]mm · 6 of 240 slices shown]
[im 1/240]
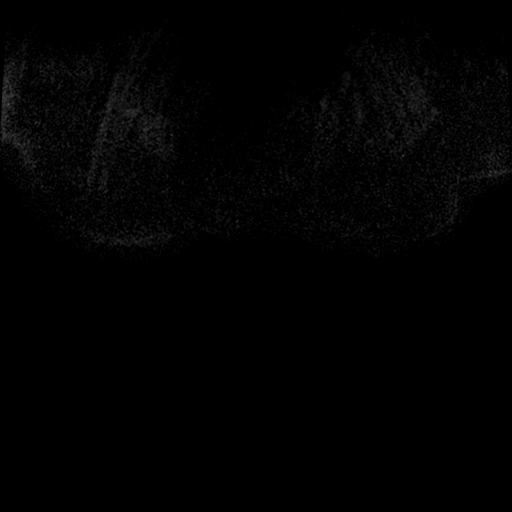
[im 48/240]
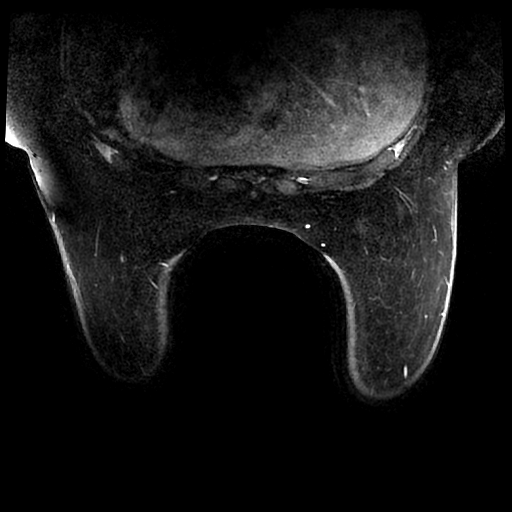
[im 96/240]
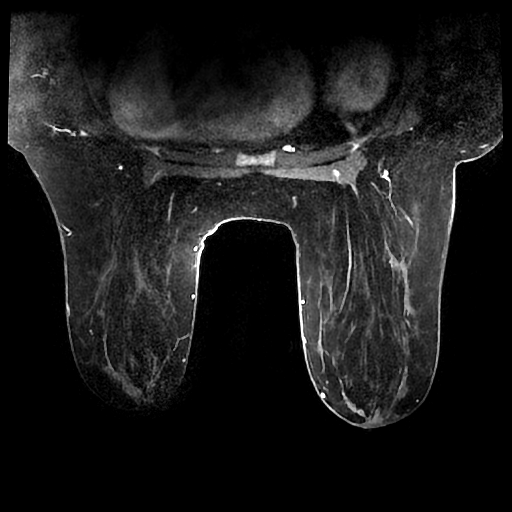
[im 144/240]
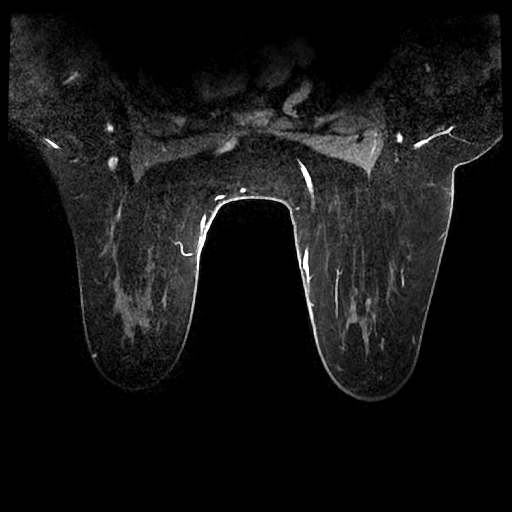
[im 192/240]
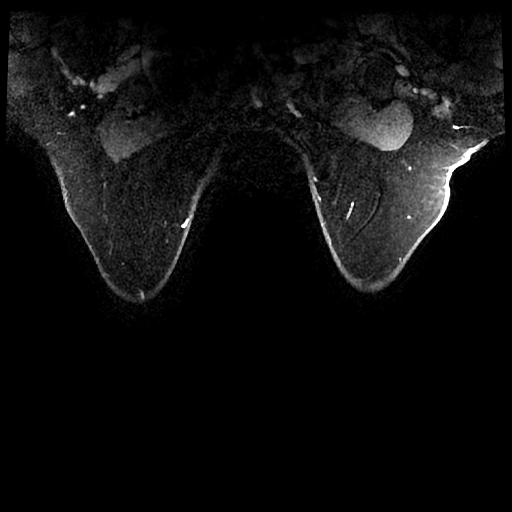
[im 240/240]
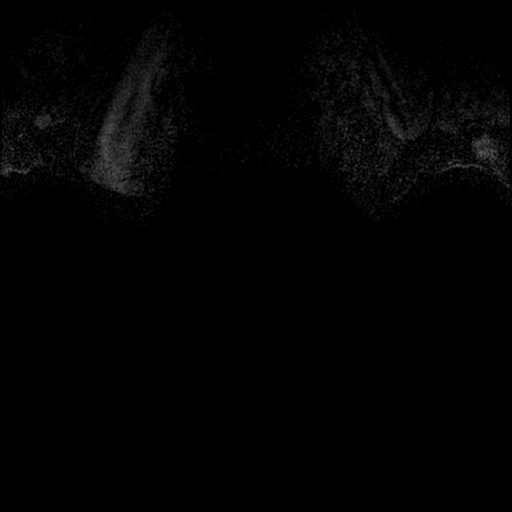

[Series 702: ph2/20ml vibrant mph · axial · 1.8mm · 0.66mm/px · z∈[-113,+59]mm · 5 of 240 slices shown]
[im 1/240]
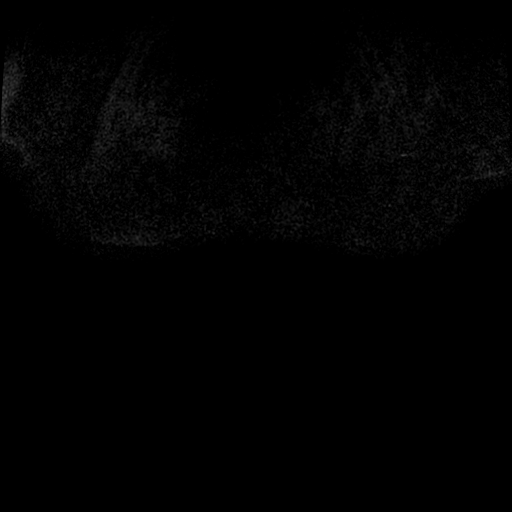
[im 48/240]
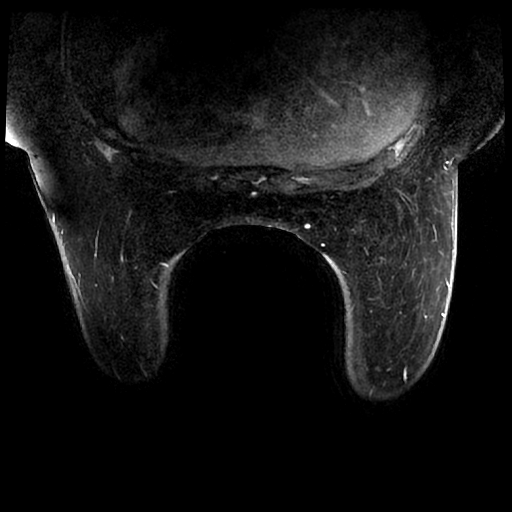
[im 96/240]
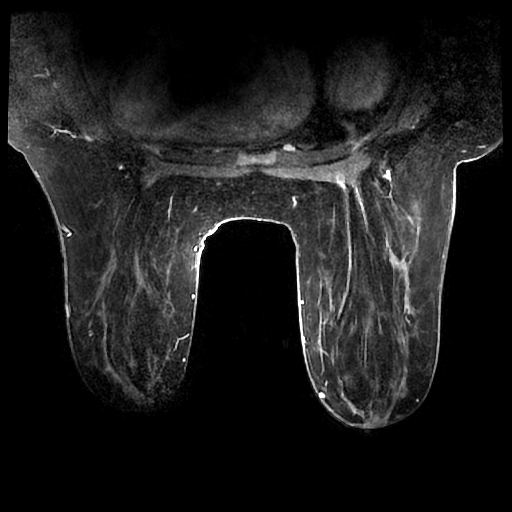
[im 144/240]
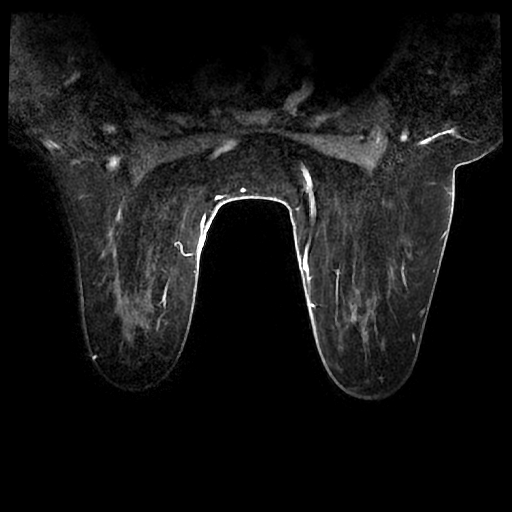
[im 192/240]
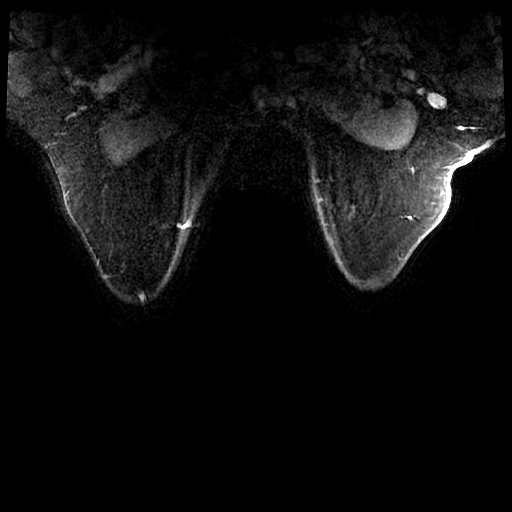

[18 of 48 positions shown; findings below may reference images not displayed]

FINDINGS: Previously, large area of enhancement was identified in
the lateral portion of the left breast which measured 2.9 x 5.2 x
2.5 cm.  On the current study, no significant residual enhancement
remains.  A clip is identified in the lateral portion of the left
breast, placed at the time of ultrasound guided core biopsy.

Level I axillary lymph nodes are now significantly smaller on the
left. A few, small lymph nodes lack fatty hilum on the left.  These
appear asymmetric compared to the right axillary lymph nodes but
are quite small, measuring on the order of 9 mm in diameter.

Background parenchymal enhancement is minimal.  Images of the right
breast are unremarkable.  On the right, no suspicious lymph nodes
are identified.  The patient has a right-sided Port-A-Cath.
IMPRESSION: 1.  No significant residual enhancement in the area of known
malignancy in the lateral portion of the left breast.
2.  Left axillary lymph nodes are now significantly smaller. Some
of the level I axillary lymph nodes continue to have morphologic
abnormality, suspicious for malignancy.

THREE-DIMENSIONAL MR IMAGE RENDERING ON INDEPENDENT WORKSTATION:

Three-dimensional MR images were rendered by post-processing of the
original MR data on an independent workstation.  The three-
dimensional MR images were interpreted, and findings were reported
in the accompanying complete MRI report for this study.

BI-RADS CATEGORY 6:  Known biopsy-proven malignancy - appropriate
action should be taken.

## 2011-09-05 MED ORDER — GADOBENATE DIMEGLUMINE 529 MG/ML IV SOLN
20.0000 mL | Freq: Once | INTRAVENOUS | Status: AC | PRN
  Administered 2011-09-05: 20 mL via INTRAVENOUS

## 2011-09-06 ENCOUNTER — Ambulatory Visit (HOSPITAL_BASED_OUTPATIENT_CLINIC_OR_DEPARTMENT_OTHER): Admitting: Physician Assistant

## 2011-09-06 ENCOUNTER — Other Ambulatory Visit (HOSPITAL_BASED_OUTPATIENT_CLINIC_OR_DEPARTMENT_OTHER): Admitting: Lab

## 2011-09-06 ENCOUNTER — Encounter: Payer: Self-pay | Admitting: Physician Assistant

## 2011-09-06 VITALS — BP 120/79 | HR 83 | Temp 97.7°F | Ht 63.0 in | Wt 228.8 lb

## 2011-09-06 DIAGNOSIS — C50419 Malignant neoplasm of upper-outer quadrant of unspecified female breast: Secondary | ICD-10-CM

## 2011-09-06 DIAGNOSIS — D709 Neutropenia, unspecified: Secondary | ICD-10-CM

## 2011-09-06 DIAGNOSIS — B029 Zoster without complications: Secondary | ICD-10-CM

## 2011-09-06 DIAGNOSIS — C50919 Malignant neoplasm of unspecified site of unspecified female breast: Secondary | ICD-10-CM

## 2011-09-06 LAB — CBC WITH DIFFERENTIAL/PLATELET
BASO%: 1.8 % (ref 0.0–2.0)
Basophils Absolute: 0 10*3/uL (ref 0.0–0.1)
EOS%: 0.9 % (ref 0.0–7.0)
Eosinophils Absolute: 0 10*3/uL (ref 0.0–0.5)
HCT: 28.3 % — ABNORMAL LOW (ref 34.8–46.6)
HGB: 9.5 g/dL — ABNORMAL LOW (ref 11.6–15.9)
LYMPH%: 37.6 % (ref 14.0–49.7)
MCH: 28.6 pg (ref 25.1–34.0)
MCHC: 33.6 g/dL (ref 31.5–36.0)
MCV: 85.2 fL (ref 79.5–101.0)
MONO#: 0 10*3/uL — ABNORMAL LOW (ref 0.1–0.9)
MONO%: 1.8 % (ref 0.0–14.0)
NEUT#: 0.6 10*3/uL — ABNORMAL LOW (ref 1.5–6.5)
NEUT%: 57.9 % (ref 38.4–76.8)
Platelets: 44 10*3/uL — ABNORMAL LOW (ref 145–400)
RBC: 3.32 10*6/uL — ABNORMAL LOW (ref 3.70–5.45)
RDW: 16.1 % — ABNORMAL HIGH (ref 11.2–14.5)
WBC: 1.1 10*3/uL — ABNORMAL LOW (ref 3.9–10.3)
lymph#: 0.4 10*3/uL — ABNORMAL LOW (ref 0.9–3.3)
nRBC: 0 % (ref 0–0)

## 2011-09-06 NOTE — Progress Notes (Signed)
Hematology and Oncology Follow Up Visit  Kelsey Wilson [id number] 10-07-1960 51 y.o. 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 Currently day 6 cycle 4 of 4 planned neoadjuvant dose dense FEC, after 10 day delay due to herpes zoster of the left buttock and left upper thigh region. Neulasta support planned on and in in day 2. 2. History of severe afebrile neutropenia despite Neulasta on day 2, Cipro used for neutropenic coverage,  3. Herpes zoster of the left buttock and left upper thigh region on Valtrex 500 mg by mouth 3 times a day, 4. Post herpetic neuralgia and gabapentin 100 mg by mouth 3 times a day.  Interim History:   Kelsey Wilson is seen today with her mother in accompaniment for followup after her fourth of 4 planned cycles of neoadjuvant dose dense FEC. She did receive a breast MRI yesterday and is here today for test results. She is appropriately fatigued, she is on Cipro 500 mg by mouth twice a day given her history of severe afebrile neutropenia despite Neulasta usage. She admits that she stop her Valtrex. She has not utilize gabapentin. The area of her herpes zoster in the left upper thigh is starting to "stinging" again. She denies any fevers, chills, or night sweats. No persistent nausea, emesis, diarrhea or constipation problems at this point in time.. A detailed review of systems is otherwise noncontributory as noted below.  Review of Systems: Constitutional:  Fatigue. Eyes: No complaints ENT: No complaints Cardiovascular: no chest pain or dyspnea on exertion Respiratory: no cough, shortness of breath, or wheezing Neurological: no TIA or stroke symptoms Dermatological: negative Gastrointestinal: no abdominal pain, change in bowel habits, or black or bloody stools Genito-Urinary: no dysuria, trouble voiding,  or hematuria Hematological and Lymphatic: negative Breast: known L breast cancer Musculoskeletal: Pain on the left upper thigh region associated with known herpes zoster, improved with gabapentin. Remaining ROS negative.   Medications:   I have reviewed the patient's current medications.  Current Outpatient Prescriptions  Medication Sig Dispense Refill  . dexamethasone (DECADRON) 4 MG tablet Take 4 mg by mouth 2 (two) times daily with a meal.      . gabapentin (NEURONTIN) 100 MG capsule Take 100 mg by mouth 3 (three) times daily.      Marland Kitchen HYDROcodone-acetaminophen (VICODIN) 5-500 MG per tablet Take 1 tablet by mouth every 6 (six) hours as needed. 1 po q6-8h prn pain  #90 w/1 RF, 08/27/11-CTS      . lidocaine-prilocaine (EMLA) cream Apply topically as directed. ONE TO TWO HOURS BEFORE TREATMENT.  60 g  1  . loratadine (CLARITIN) 10 MG tablet Take 10 mg by mouth daily.      Marland Kitchen LORazepam (ATIVAN) 0.5 MG tablet Take 0.5 mg by mouth every 8 (eight) hours.      . ondansetron (ZOFRAN) 8 MG tablet Take 1 tablet two times a day as needed for nausea or vomiting starting on the third day after chemotherapy.  30 tablet  1  . valACYclovir (VALTREX) 500 MG tablet Take 500 mg by mouth 2 (two) times daily.      Marland Kitchen DISCONTD: prochlorperazine (COMPAZINE) 10 MG tablet Take 1 tablet (10 mg total) by mouth every 6 (six) hours as needed (Nausea or vomiting).  30 tablet  1  . DISCONTD: prochlorperazine (COMPAZINE) 25 MG suppository Place 1 suppository (  25 mg total) rectally every 12 (twelve) hours as needed for nausea.  12 suppository  3   No current facility-administered medications for this visit.   Facility-Administered Medications Ordered in Other Visits  Medication Dose Route Frequency Provider Last Rate Last Dose  . gadobenate dimeglumine (MULTIHANCE) injection 20 mL  20 mL Intravenous Once PRN Medication Radiologist, MD   20 mL at 09/05/11 1554    Allergies:  Allergies  Allergen Reactions  . Compazine  Other (See Comments)    Numbness of face and lips  . Penicillins Nausea Only    Physical Exam: Filed Vitals:   09/06/11 1413  BP: 120/79  Pulse: 83  Temp: 97.7 F (36.5 C)  Weight: 228 lbs. HEENT: Sclerae anicteric, conjunctivae pink. Oropharynx clear. No mucositis or candidiasis.  Nodes: No cervical, supraclavicular, or axillary lymphadenopathy palpated.  Breast Exam: Deferred.  Lungs: Clear to auscultation bilaterally. No crackles, rhonchi, or wheezes.  Heart: Regular rate and rhythm.  Abdomen: Soft, nontender. Positive bowel sounds. No organomegaly or masses palpated.  Musculoskeletal: No focal spinal tenderness to palpation.  Extremities: Benign. No peripheral edema or cyanosis.  Skin: The region on her left upper thigh anteriorly is continuing to fill in nicely. No evidence of erythema. Neuro: Nonfocal, alert and oriented x 3.   Lab Results: Lab Results  Component Value Date   WBC 1.1* 09/06/2011   HGB 9.5* 09/06/2011   HCT 28.3* 09/06/2011   MCV 85.2 09/06/2011   PLT 44* 09/06/2011   NEUTROABS 0.6* 09/06/2011     Chemistry      Component Value Date/Time   NA 142 08/30/2011 1050   K 3.8 08/30/2011 1050   CL 104 08/30/2011 1050   CO2 30 08/30/2011 1050   BUN 12 08/30/2011 1050   CREATININE 0.62 08/30/2011 1050      Component Value Date/Time   CALCIUM 9.5 08/30/2011 1050   ALKPHOS 47 08/30/2011 1050   AST 17 08/30/2011 1050   ALT 13 08/30/2011 1050   BILITOT 0.6 08/30/2011 1050     Radiographic data: 09/05/11 BILATERAL BREAST MRI WITH AND WITHOUT CONTRAST  Technique: Multiplanar, multisequence MR images of both breasts were obtained prior to and following the intravenous administration of 20ml of Multihance. Three dimensional images were evaluated at the independent DynaCad workstation.  Comparison: MRI 06/08/2011, mammogram and ultrasound 06/01/2011 [facility] Imaging Findings: Previously, large area of enhancement was identified in the lateral  portion of the left breast which measured 2.9 x 5.2 x 2.5 cm. On the current study, no significant residual enhancement remains. A clip is identified in the lateral portion of the left breast, placed at the time of ultrasound guided core biopsy. Level I axillary lymph nodes are now significantly smaller on the left. A few, small lymph nodes lack fatty hilum on the left. These appear asymmetric compared to the right axillary lymph nodes but are quite small, measuring on the order of 9 mm in diameter. Background parenchymal enhancement is minimal. Images of the right breast are unremarkable. On the right, no suspicious lymph nodes are identified. The patient has a right-sided Port-A-Cath.  IMPRESSION:  1. No significant residual enhancement in the area of known  malignancy in the lateral portion of the left breast.  2. Left axillary lymph nodes are now significantly smaller. Some  of the level I axillary lymph nodes continue to have morphologic  abnormality, suspicious for malignancy.  Original Report Authenticated By: Patterson Hammersmith, M.D.   Assessment:  Ms. 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 For day 1 cycle 4 of 4 planned neoadjuvant dose dense FEC, after 10 day delay due to herpes zoster of the left buttock and left upper thigh region. Neulasta support planned on and in in day 2. Excellent radiographic response per MRI obtained on 09/05/2011. 2. Severe afebrile neutropenia despite Neulasta on day 2, Cipro 500 mg by mouth twice a day.,  3. Herpes zoster of the left buttock and left upper thigh region on Valtrex 500 mg by mouth 3 times a day, the patient discontinued. 4. Symptoms suggesting postherpetic neuralgia, prescribed gabapentin 100 mg by mouth 3 times a day but patient not taking.  Case has been reviewed with Dr. Pierce Crane.  Plan:    Kelsey Wilson will continue on Cipro 500 mg by mouth twice a day until 09/11/2011. She will also resume Valtrex 500 mg by mouth twice a day. She was also instructed to take her gabapentin 100 mg by mouth 3 times a day. We will regroup and 09/13/2011 with a repeat CBC for reassessment, though I will defer initiating her taxine based portion of her treatment until 09/20/2011. Patient understands and agrees with this plan. She knows to call with any changes or problems.    Tavonna Worthington T, PA-C 08/30/11

## 2011-09-06 NOTE — Patient Instructions (Signed)
1. Continue Cipro 500mg  twice a day until 09/12/11.  2. Resume Valtrex 500mg  twice a day to help the area of shingles heal.  3. Continue gabapentin 100mg  three times per day to help with the pain associated with the area of shingles.

## 2011-09-07 ENCOUNTER — Telehealth: Payer: Self-pay | Admitting: *Deleted

## 2011-09-07 NOTE — Telephone Encounter (Signed)
left voice message to inform the patient that she will have treatment on 09-20-2011 after she see Dr. Carmela Rima

## 2011-09-13 ENCOUNTER — Other Ambulatory Visit (HOSPITAL_BASED_OUTPATIENT_CLINIC_OR_DEPARTMENT_OTHER): Admitting: Lab

## 2011-09-13 ENCOUNTER — Ambulatory Visit (HOSPITAL_BASED_OUTPATIENT_CLINIC_OR_DEPARTMENT_OTHER): Admitting: Physician Assistant

## 2011-09-13 ENCOUNTER — Ambulatory Visit

## 2011-09-13 ENCOUNTER — Encounter: Payer: Self-pay | Admitting: *Deleted

## 2011-09-13 VITALS — BP 124/79 | HR 84 | Temp 98.4°F | Ht 63.0 in | Wt 226.0 lb

## 2011-09-13 DIAGNOSIS — C50419 Malignant neoplasm of upper-outer quadrant of unspecified female breast: Secondary | ICD-10-CM

## 2011-09-13 LAB — CBC WITH DIFFERENTIAL/PLATELET
BASO%: 0.2 % (ref 0.0–2.0)
Basophils Absolute: 0 10*3/uL (ref 0.0–0.1)
EOS%: 0 % (ref 0.0–7.0)
Eosinophils Absolute: 0 10*3/uL (ref 0.0–0.5)
HCT: 31.8 % — ABNORMAL LOW (ref 34.8–46.6)
HGB: 10.6 g/dL — ABNORMAL LOW (ref 11.6–15.9)
LYMPH%: 12.4 % — ABNORMAL LOW (ref 14.0–49.7)
MCH: 29 pg (ref 25.1–34.0)
MCHC: 33.3 g/dL (ref 31.5–36.0)
MCV: 86.9 fL (ref 79.5–101.0)
MONO#: 0.7 10*3/uL (ref 0.1–0.9)
MONO%: 13.4 % (ref 0.0–14.0)
NEUT#: 3.6 10*3/uL (ref 1.5–6.5)
NEUT%: 74 % (ref 38.4–76.8)
Platelets: 143 10*3/uL — ABNORMAL LOW (ref 145–400)
RBC: 3.66 10*6/uL — ABNORMAL LOW (ref 3.70–5.45)
RDW: 16.7 % — ABNORMAL HIGH (ref 11.2–14.5)
WBC: 4.9 10*3/uL (ref 3.9–10.3)
lymph#: 0.6 10*3/uL — ABNORMAL LOW (ref 0.9–3.3)

## 2011-09-13 NOTE — Patient Instructions (Signed)
1. We will be starting Taxotere next week.  It will be given every 2 weeks for 4 treatments.  Again, Neulasta will be used.  2.  Decadron 4mg  tabs,      -starting on 09/19/11, take 2 in the AM, 2 in the PM ("day before")      -on 09/20/11, 2 in the AM, 2 in the PM ("day of")      -on 09/21/11, 2 in the AM, 2 in the PM ("day after")

## 2011-09-13 NOTE — Progress Notes (Signed)
Hematology and Oncology Follow Up Visit  Kelsey Wilson [id number] 10/01/1960 51 y.o. 08/30/11   HPI: 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 Completion 4/4 planned neoadjuvant dose dense FEC, after delay of cycle 4 due to herpes zoster of the left buttock and left upper thigh region. Neulasta support planned on and in in day 2. Excellent radiographic response per MRI obtained on 09/05/2011. Due to initiate her first cycle of taxane based therapy on 09/20/2011. 2. History of severe afebrile neutropenia despite Neulasta on day 2, Cipro 500 mg by mouth twice a day.,  3. Herpes zoster of the left buttock and left upper thigh region on Valtrex 500 mg by mouth 3 times a day, the patient discontinued. 4. Symptoms suggesting postherpetic neuralgia, gabapentin 100 mg by mouth 3 times a day.   Interim History:   Kelsey Wilson is seen today with her mother in accompaniment for followup. She is due to initiate her first of 4 planned neoadjuvant taxane-based therapies on 09/20/2011. She admits that up until today is essentially feeling pretty well, but is currently feeling a bit nauseated. She has no emesis. She denies fevers, chills, or night sweats. No baseline neuropathy symptoms. Review of systems is otherwise noncontributory as noted below.  Review of Systems: Constitutional:  Fatigue. Eyes: No complaints ENT: No complaints Cardiovascular: no chest pain or dyspnea on exertion Respiratory: no cough, shortness of breath, or wheezing Neurological: no TIA or stroke symptoms Dermatological: negative Gastrointestinal: no abdominal pain, change in bowel habits, or black or bloody stools Genito-Urinary: no dysuria, trouble voiding, or hematuria Hematological and Lymphatic: negative Breast: known L breast cancer Musculoskeletal: Pain on the left upper  thigh region associated with known herpes zoster, improved with gabapentin. Remaining ROS negative.   Medications:   I have reviewed the patient's current medications.  Current Outpatient Prescriptions  Medication Sig Dispense Refill  . dexamethasone (DECADRON) 4 MG tablet Take 4 mg by mouth 2 (two) times daily with a meal.      . gabapentin (NEURONTIN) 100 MG capsule Take 100 mg by mouth 3 (three) times daily.      Marland Kitchen HYDROcodone-acetaminophen (VICODIN) 5-500 MG per tablet Take 1 tablet by mouth every 6 (six) hours as needed. 1 po q6-8h prn pain  #90 w/1 RF, 08/27/11-CTS      . lidocaine-prilocaine (EMLA) cream Apply topically as directed. ONE TO TWO HOURS BEFORE TREATMENT.  60 g  1  . loratadine (CLARITIN) 10 MG tablet Take 10 mg by mouth daily.      Marland Kitchen LORazepam (ATIVAN) 0.5 MG tablet Take 0.5 mg by mouth every 8 (eight) hours.      . valACYclovir (VALTREX) 500 MG tablet Take 500 mg by mouth 2 (two) times daily.      Marland Kitchen DISCONTD: prochlorperazine (COMPAZINE) 10 MG tablet Take 1 tablet (10 mg total) by mouth every 6 (six) hours as needed (Nausea or vomiting).  30 tablet  1  . DISCONTD: prochlorperazine (COMPAZINE) 25 MG suppository Place 1 suppository (25 mg total) rectally every 12 (twelve) hours as needed for nausea.  12 suppository  3    Allergies:  Allergies  Allergen Reactions  . Compazine Other (See Comments)    Numbness of face and lips  . Penicillins Nausea Only    Physical Exam: Filed Vitals:   09/13/11 1133  BP: 124/79  Pulse: 84  Temp: 98.4 F (36.9 C)  Weight: 226 lbs. HEENT: Sclerae anicteric, conjunctivae pink. Oropharynx clear. No mucositis or candidiasis.  Nodes: No cervical, supraclavicular, or axillary lymphadenopathy palpated.  Breast Exam: Deferred.  Lungs: Clear to auscultation bilaterally. No crackles, rhonchi, or wheezes.  Heart: Regular rate and rhythm.  Abdomen: Soft, nontender. Positive bowel sounds. No organomegaly or masses palpated.    Musculoskeletal: No focal spinal tenderness to palpation.  Extremities: Benign. No peripheral edema or cyanosis.  Skin: The region on her left upper thigh anteriorly is continuing to fill in nicely. No evidence of erythema. Neuro: Nonfocal, alert and oriented x 3.   Lab Results: Lab Results  Component Value Date   WBC 4.9 09/13/2011   HGB 10.6* 09/13/2011   HCT 31.8* 09/13/2011   MCV 86.9 09/13/2011   PLT 143* 09/13/2011   NEUTROABS 3.6 09/13/2011     Chemistry      Component Value Date/Time   NA 142 08/30/2011 1050   K 3.8 08/30/2011 1050   CL 104 08/30/2011 1050   CO2 30 08/30/2011 1050   BUN 12 08/30/2011 1050   CREATININE 0.62 08/30/2011 1050      Component Value Date/Time   CALCIUM 9.5 08/30/2011 1050   ALKPHOS 47 08/30/2011 1050   AST 17 08/30/2011 1050   ALT 13 08/30/2011 1050   BILITOT 0.6 08/30/2011 1050     Radiographic data: 09/05/11 BILATERAL BREAST MRI WITH AND WITHOUT CONTRAST  Technique: Multiplanar, multisequence MR images of both breasts were obtained prior to and following the intravenous administration of 20ml of Multihance. Three dimensional images were evaluated at the independent DynaCad workstation.  Comparison: MRI 06/08/2011, mammogram and ultrasound 06/01/2011 and [facility] Imaging Findings: Previously, large area of enhancement was identified in the lateral portion of the left breast which measured 2.9 x 5.2 x 2.5 cm. On the current study, no significant residual enhancement remains. A clip is identified in the lateral portion of the left breast, placed at the time of ultrasound guided core biopsy. Level I axillary lymph nodes are now significantly smaller on the left. A few, small lymph nodes lack fatty hilum on the left. These appear asymmetric compared to the right axillary lymph nodes but are quite small, measuring on the order of 9 mm in diameter. Background parenchymal enhancement is minimal. Images of the right breast are  unremarkable. On the right, no suspicious lymph nodes are identified. The patient has a right-sided Port-A-Cath.  IMPRESSION:  1. No significant residual enhancement in the area of known  malignancy in the lateral portion of the left breast.  2. Left axillary lymph nodes are now significantly smaller. Some  of the level I axillary lymph nodes continue to have morphologic  abnormality, suspicious for malignancy.  Original Report Authenticated By: Patterson Hammersmith, M.D.   Assessment:  Ms. Warriner is a 51 year old [place] woman with a locally advanced clinical T3 N1 left breast carcinoma ER/PR positive at 8/9% respectively, HER-2 negative with a Ki-67 of 100%. Completion 4/4 planned neoadjuvant dose dense FEC, after delay of cycle 4 due to herpes zoster of the left buttock and left upper thigh region. Neulasta support planned on and in in day 2. Excellent radiographic response per MRI obtained on 09/05/2011. Due to initiate her first cycle of taxane based therapy on 09/20/2011. 2. History of severe afebrile neutropenia despite Neulasta on day 2, Cipro 500 mg by mouth twice a day.,  3. Herpes zoster of the left buttock  and left upper thigh region on Valtrex 500 mg by mouth 3 times a day, the patient discontinued. 4. Symptoms suggesting postherpetic neuralgia, gabapentin 100 mg by mouth 3 times a day.  Case has been reviewed with Dr. Pierce Crane.  Plan:  Kelsey Wilson will return on 09/20/2011 to initiate day 1 cycle 1 of 4 planned neoadjuvant dose dense Taxotere. Again Neulasta will be utilized on day 2. A new prescription for dexamethasone has been provided, she understands to take 8 mg of Decadron twice a day the day before, day of, day after treatment. She will continue on her Valtrex and gabapentin. Patient understands and agrees with this plan. She knows to call with any changes or problems.    Gloria Ricardo T, PA-C 08/30/11

## 2011-09-13 NOTE — Progress Notes (Signed)
[facility] Brief Psychosocial Assessment Clinical Social Work  Clinical Social Work was referred by patient for assessment of psychosocial needs.  Clinical Social Worker met with patient at [facility] to offer support and assess for needs.  Pt stated she was experience financial stress associated with cancer treatment, being unable to work, and provided child care to her grandchildren.  CSW validated the pt's concerns, and reviewed possible financial resources.  Pt's only source of income is her [facility] benefit, and she has been approved for [facility] financial assistance.  CSW and pt reviewed applications for [place], [employer], [employer], [employer], and [employer].  CSW and pt listed documents and information pt needs to obtain to complete applications; which pt plans to bring on 4/18 at her next appointment.  Pt also expressed some concern with transportation/gas expenses, and availability of her family members to transport her to treatment/appointments.  CSW discussed the [facility] program with pt and pt agreed to CSW making a referral.  CSW and pt are scheduled to meet on 09/20/11 to complete financial resource applications and address any additional needs or concerns.  CSW encouraged pt to call with any questions or concerns.      Patient identified barriers to care as: transportation [employer] financial concerns  Clinical Social Work interventions: Control and instrumentation engineer in [place] [employer] [facility]   Tamala Julian, MSW, LCSW Clinical Social Worker [facility] [phone]

## 2011-09-14 ENCOUNTER — Ambulatory Visit

## 2011-09-20 ENCOUNTER — Ambulatory Visit (HOSPITAL_BASED_OUTPATIENT_CLINIC_OR_DEPARTMENT_OTHER)

## 2011-09-20 ENCOUNTER — Ambulatory Visit (HOSPITAL_BASED_OUTPATIENT_CLINIC_OR_DEPARTMENT_OTHER): Admitting: Physician Assistant

## 2011-09-20 ENCOUNTER — Encounter: Payer: Self-pay | Admitting: *Deleted

## 2011-09-20 ENCOUNTER — Telehealth: Payer: Self-pay | Admitting: *Deleted

## 2011-09-20 ENCOUNTER — Other Ambulatory Visit (HOSPITAL_BASED_OUTPATIENT_CLINIC_OR_DEPARTMENT_OTHER): Admitting: Lab

## 2011-09-20 ENCOUNTER — Encounter: Payer: Self-pay | Admitting: Physician Assistant

## 2011-09-20 VITALS — BP 128/78 | HR 78 | Temp 98.0°F | Ht 63.0 in | Wt 226.0 lb

## 2011-09-20 VITALS — BP 123/78 | HR 67 | Temp 97.0°F

## 2011-09-20 DIAGNOSIS — Z170 Estrogen receptor positive status [ER+]: Secondary | ICD-10-CM

## 2011-09-20 DIAGNOSIS — C50919 Malignant neoplasm of unspecified site of unspecified female breast: Secondary | ICD-10-CM

## 2011-09-20 DIAGNOSIS — D709 Neutropenia, unspecified: Secondary | ICD-10-CM

## 2011-09-20 DIAGNOSIS — C50912 Malignant neoplasm of unspecified site of left female breast: Secondary | ICD-10-CM

## 2011-09-20 DIAGNOSIS — C50419 Malignant neoplasm of upper-outer quadrant of unspecified female breast: Secondary | ICD-10-CM

## 2011-09-20 DIAGNOSIS — Z5111 Encounter for antineoplastic chemotherapy: Secondary | ICD-10-CM

## 2011-09-20 DIAGNOSIS — B0229 Other postherpetic nervous system involvement: Secondary | ICD-10-CM

## 2011-09-20 LAB — CBC WITH DIFFERENTIAL/PLATELET
BASO%: 0.1 % (ref 0.0–2.0)
Basophils Absolute: 0 10*3/uL (ref 0.0–0.1)
EOS%: 0 % (ref 0.0–7.0)
Eosinophils Absolute: 0 10*3/uL (ref 0.0–0.5)
HCT: 31.7 % — ABNORMAL LOW (ref 34.8–46.6)
HGB: 10.6 g/dL — ABNORMAL LOW (ref 11.6–15.9)
LYMPH%: 6.7 % — ABNORMAL LOW (ref 14.0–49.7)
MCH: 28.8 pg (ref 25.1–34.0)
MCHC: 33.4 g/dL (ref 31.5–36.0)
MCV: 86.1 fL (ref 79.5–101.0)
MONO#: 0.4 10*3/uL (ref 0.1–0.9)
MONO%: 5 % (ref 0.0–14.0)
NEUT#: 7.5 10*3/uL — ABNORMAL HIGH (ref 1.5–6.5)
NEUT%: 88.2 % — ABNORMAL HIGH (ref 38.4–76.8)
Platelets: 353 10*3/uL (ref 145–400)
RBC: 3.68 10*6/uL — ABNORMAL LOW (ref 3.70–5.45)
RDW: 16.8 % — ABNORMAL HIGH (ref 11.2–14.5)
WBC: 8.5 10*3/uL (ref 3.9–10.3)
lymph#: 0.6 10*3/uL — ABNORMAL LOW (ref 0.9–3.3)
nRBC: 0 % (ref 0–0)

## 2011-09-20 LAB — COMPREHENSIVE METABOLIC PANEL
ALT: 14 U/L (ref 0–35)
AST: 16 U/L (ref 0–37)
Albumin: 4.1 g/dL (ref 3.5–5.2)
Alkaline Phosphatase: 54 U/L (ref 39–117)
BUN: 15 mg/dL (ref 6–23)
CO2: 25 mEq/L (ref 19–32)
Calcium: 9.2 mg/dL (ref 8.4–10.5)
Chloride: 107 mEq/L (ref 96–112)
Creatinine, Ser: 0.54 mg/dL (ref 0.50–1.10)
Glucose, Bld: 125 mg/dL — ABNORMAL HIGH (ref 70–99)
Potassium: 3.8 mEq/L (ref 3.5–5.3)
Sodium: 141 mEq/L (ref 135–145)
Total Bilirubin: 0.5 mg/dL (ref 0.3–1.2)
Total Protein: 6.5 g/dL (ref 6.0–8.3)

## 2011-09-20 LAB — LACTATE DEHYDROGENASE: LDH: 194 U/L (ref 94–250)

## 2011-09-20 MED ORDER — HEPARIN SOD (PORK) LOCK FLUSH 100 UNIT/ML IV SOLN
500.0000 [IU] | Freq: Once | INTRAVENOUS | Status: AC | PRN
  Administered 2011-09-20: 500 [IU]
  Filled 2011-09-20: qty 5

## 2011-09-20 MED ORDER — DEXAMETHASONE SODIUM PHOSPHATE 10 MG/ML IJ SOLN
10.0000 mg | Freq: Once | INTRAMUSCULAR | Status: AC
  Administered 2011-09-20: 10 mg via INTRAVENOUS

## 2011-09-20 MED ORDER — SODIUM CHLORIDE 0.9 % IV SOLN
Freq: Once | INTRAVENOUS | Status: AC
  Administered 2011-09-20: 12:00:00 via INTRAVENOUS

## 2011-09-20 MED ORDER — DEXAMETHASONE 4 MG PO TABS: ORAL_TABLET | ORAL | Status: DC

## 2011-09-20 MED ORDER — ONDANSETRON HCL 8 MG PO TABS: ORAL_TABLET | ORAL | Status: DC

## 2011-09-20 MED ORDER — SODIUM CHLORIDE 0.9 % IJ SOLN
10.0000 mL | INTRAMUSCULAR | Status: DC | PRN
  Administered 2011-09-20: 10 mL
  Filled 2011-09-20: qty 10

## 2011-09-20 MED ORDER — LORAZEPAM 2 MG/ML IJ SOLN
0.5000 mg | Freq: Once | INTRAMUSCULAR | Status: AC
  Administered 2011-09-20: 0.5 mg via INTRAVENOUS

## 2011-09-20 MED ORDER — PROCHLORPERAZINE 25 MG RE SUPP: 25.0000 mg | Freq: Two times a day (BID) | RECTAL | Status: DC | PRN

## 2011-09-20 MED ORDER — ONDANSETRON 8 MG/50ML IVPB (CHCC)
8.0000 mg | Freq: Once | INTRAVENOUS | Status: AC
  Administered 2011-09-20: 8 mg via INTRAVENOUS

## 2011-09-20 MED ORDER — LORAZEPAM 0.5 MG PO TABS: 0.5000 mg | ORAL_TABLET | Freq: Four times a day (QID) | ORAL | Status: DC | PRN

## 2011-09-20 MED ORDER — PROCHLORPERAZINE MALEATE 10 MG PO TABS: 10.0000 mg | ORAL_TABLET | Freq: Four times a day (QID) | ORAL | Status: DC | PRN

## 2011-09-20 MED ORDER — DOCETAXEL CHEMO INJECTION 160 MG/16ML
75.0000 mg/m2 | Freq: Once | INTRAVENOUS | Status: AC
  Administered 2011-09-20: 160 mg via INTRAVENOUS
  Filled 2011-09-20: qty 16

## 2011-09-20 NOTE — Progress Notes (Signed)
Hematology and Oncology Follow Up Visit  Kelsey Wilson [id number] 1960/08/24 51 y.o. 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 Completion 4/4 planned neoadjuvant dose dense FEC, after delay of cycle 4 due to herpes zoster of the left buttock and left upper thigh region. Neulasta support planned on and in in day 2. Excellent radiographic response per MRI obtained on 09/05/2011. Due to initiate her first cycle of neoadjuvant dose-dense Taxotere today. 2. History of severe afebrile neutropenia despite Neulasta on day 2.  3. Herpes zoster of the left buttock and left upper thigh region on Valtrex 500 mg by mouth 3 times a day, the patient discontinued. 4. Symptoms suggesting postherpetic neuralgia, gabapentin 100 mg by mouth 3 times a day, improvement.   Interim History:   Kelsey Wilson is seen today with her mother in accompaniment for followup prior to her first of 4 planned neoadjuvant dose dense Taxotere with Neulasta support on day 2. She has taken dexamethasone per Taxotere protocol. Today she is feeling well, she denies any fevers, chills, or night sweats. No nausea, emesis, diarrhea, or constipation issues. Bursitis shingles in the left upper anterior thigh has healed in nicely, she is taking gabapentin which is helped with the postherpetic neuralgia. She denies any baseline neuropathy symptoms. She denies any issues with her appetite. No mouth sores. No excessive eye tearing. Energy level has improved. Review of systems is as listed below.  Review of Systems: Constitutional:  Improved. Eyes: No complaints ENT: No complaints Cardiovascular: no chest pain or dyspnea on exertion Respiratory: no cough, shortness of breath, or wheezing Neurological: no TIA or stroke symptoms Dermatological: negative Gastrointestinal: no  abdominal pain, change in bowel habits, or black or bloody stools Genito-Urinary: no dysuria, trouble voiding, or hematuria Hematological and Lymphatic: negative Breast: known L breast cancer Musculoskeletal: Pain on the left upper thigh region associated with known herpes zoster, improved with gabapentin. Remaining ROS negative.   Medications:   I have reviewed the patient's current medications.  Current Outpatient Prescriptions  Medication Sig Dispense Refill  . dexamethasone (DECADRON) 4 MG tablet Take 2 tabs two times a day the day before Taxotere chemo. Then take 2 tabs daily starting the day after chemo for 2 days.  30 tablet  1  . gabapentin (NEURONTIN) 100 MG capsule Take 100 mg by mouth 3 (three) times daily.      Marland Kitchen lidocaine-prilocaine (EMLA) cream Apply topically as directed. ONE TO TWO HOURS BEFORE TREATMENT.  60 g  1  . loratadine (CLARITIN) 10 MG tablet Take 10 mg by mouth daily.      Marland Kitchen LORazepam (ATIVAN) 0.5 MG tablet Take 1 tablet (0.5 mg total) by mouth every 6 (six) hours as needed (Nausea or vomiting).  30 tablet  0  . valACYclovir (VALTREX) 500 MG tablet Take 500 mg by mouth 2 (two) times daily.      Marland Kitchen HYDROcodone-acetaminophen (VICODIN) 5-500 MG per tablet Take 1 tablet by mouth every 6 (six) hours as needed. 1 po q6-8h prn pain  #90 w/1 RF, 08/27/11-CTS      . ondansetron (ZOFRAN) 8 MG tablet Take 1 tablet two times a day for 2 days starting the day after chemo, then take two times a day as needed for nausea or vomiting.  30 tablet  1  . prochlorperazine (COMPAZINE) 10 MG tablet Take 1  tablet (10 mg total) by mouth every 6 (six) hours as needed (Nausea or vomiting).  30 tablet  1  . prochlorperazine (COMPAZINE) 25 MG suppository Place 1 suppository (25 mg total) rectally every 12 (twelve) hours as needed for nausea.  12 suppository  3    Allergies:  Allergies  Allergen Reactions  . Compazine Other (See Comments)    Numbness of face and lips  . Penicillins Nausea  Only    Physical Exam: Filed Vitals:   09/20/11 1129  BP: 128/78  Pulse: 78  Temp: 98 F (36.7 C)  Weight: 226 lbs. HEENT: Sclerae anicteric, conjunctivae pink. Oropharynx clear. No mucositis or candidiasis.  Nodes: No cervical, supraclavicular, or axillary lymphadenopathy palpated.  Breast Exam: Deferred.  Lungs: Clear to auscultation bilaterally. No crackles, rhonchi, or wheezes.  Heart: Regular rate and rhythm.  Abdomen: Soft, nontender. Positive bowel sounds. No organomegaly or masses palpated.  Musculoskeletal: No focal spinal tenderness to palpation.  Extremities: Benign. No peripheral edema or cyanosis.  Skin: The region on her left upper thigh anteriorly is continuing to fill in nicely. No evidence of erythema. Neuro: Nonfocal, alert and oriented x 3.   Lab Results: Lab Results  Component Value Date   WBC 8.5 09/20/2011   HGB 10.6* 09/20/2011   HCT 31.7* 09/20/2011   MCV 86.1 09/20/2011   PLT 353 09/20/2011   NEUTROABS 7.5* 09/20/2011     Chemistry      Component Value Date/Time   NA 142 08/30/2011 1050   K 3.8 08/30/2011 1050   CL 104 08/30/2011 1050   CO2 30 08/30/2011 1050   BUN 12 08/30/2011 1050   CREATININE 0.62 08/30/2011 1050      Component Value Date/Time   CALCIUM 9.5 08/30/2011 1050   ALKPHOS 47 08/30/2011 1050   AST 17 08/30/2011 1050   ALT 13 08/30/2011 1050   BILITOT 0.6 08/30/2011 1050     Radiographic data: 09/05/11 BILATERAL BREAST MRI WITH AND WITHOUT CONTRAST  Technique: Multiplanar, multisequence MR images of both breasts were obtained prior to and following the intravenous administration of 20ml of Multihance. Three dimensional images were evaluated at the independent DynaCad workstation.  Comparison: MRI 06/08/2011, mammogram and ultrasound 06/01/2011 [facility] Findings: Previously, large area of enhancement was identified in the lateral portion of the left breast which measured 2.9 x 5.2 x 2.5 cm. On the  current study, no significant residual enhancement remains. A clip is identified in the lateral portion of the left breast, placed at the time of ultrasound guided core biopsy. Level I axillary lymph nodes are now significantly smaller on the left. A few, small lymph nodes lack fatty hilum on the left. These appear asymmetric compared to the right axillary lymph nodes but are quite small, measuring on the order of 9 mm in diameter. Background parenchymal enhancement is minimal. Images of the right breast are unremarkable. On the right, no suspicious lymph nodes are identified. The patient has a right-sided Port-A-Cath.  IMPRESSION:  1. No significant residual enhancement in the area of known  malignancy in the lateral portion of the left breast.  2. Left axillary lymph nodes are now significantly smaller. Some  of the level I axillary lymph nodes continue to have morphologic  abnormality, suspicious for malignancy.  Original Report Authenticated By: Patterson Hammersmith, M.D.   Assessment:  Kelsey Wilson is a 51 year old [place] woman with a locally advanced clinical T3 N1 left breast carcinoma ER/PR  positive at 8/9% respectively, HER-2 negative with a Ki-67 of 100%. Completion 4/4 planned neoadjuvant dose dense FEC, after delay of cycle 4 due to herpes zoster of the left buttock and left upper thigh region. Neulasta support planned on and in in day 2. Excellent radiographic response per MRI obtained on 09/05/2011. Due to initiate her first cycle of neoadjuvant dose-dense Taxotere today. 2. History of severe afebrile neutropenia despite Neulasta on day 2.  3. Herpes zoster of the left buttock and left upper thigh region on Valtrex 500 mg by mouth 3 times a day, the patient discontinued. 4. Symptoms suggesting postherpetic neuralgia, gabapentin 100 mg by mouth 3 times a day, improvement.  Case has been reviewed with Dr. Pierce Crane.  Plan:  Kelsey Wilson will receive day 1 cycle 1/4  (n)DDTaxotere today as scheduled, to return tomorrow for Neulasta. She will take her Decadron post-Taxotere therapy per protocol. I will see her in one week's time for nadir assessment, then in 2 weeks time prior to day 1 cycle 2 of neoadjuvant dose dense Taxotere.  She will continue on Valtrex and gabapentin. She knows that she does not need to initiate Cipro as we did with the neoadjuvant dose dense FEC until his CBC has been repeated. Kelsey Wilson knows to contact us though prior if the need should arise.  Patient understands and agrees with this plan. She knows to call with any changes or problems.    Lennart Gladish T, PA-C 08/30/11

## 2011-09-20 NOTE — Telephone Encounter (Signed)
gave patient appointment for 10-04-2011 starting at 10:30 am printed out calendar and gave to the patient in the chemo room

## 2011-09-21 ENCOUNTER — Ambulatory Visit (HOSPITAL_BASED_OUTPATIENT_CLINIC_OR_DEPARTMENT_OTHER)

## 2011-09-21 VITALS — BP 115/75 | HR 77 | Temp 97.5°F

## 2011-09-21 DIAGNOSIS — C50419 Malignant neoplasm of upper-outer quadrant of unspecified female breast: Secondary | ICD-10-CM

## 2011-09-21 DIAGNOSIS — Z5189 Encounter for other specified aftercare: Secondary | ICD-10-CM

## 2011-09-21 MED ORDER — PEGFILGRASTIM INJECTION 6 MG/0.6ML
6.0000 mg | Freq: Once | SUBCUTANEOUS | Status: AC
  Administered 2011-09-21: 6 mg via SUBCUTANEOUS
  Filled 2011-09-21: qty 0.6

## 2011-09-24 ENCOUNTER — Telehealth: Payer: Self-pay | Admitting: *Deleted

## 2011-09-24 NOTE — Telephone Encounter (Signed)
Pt called stating onset of pain post therapy starting on Sunday. Kelsey Wilson states she is having all over body pain but also having headaches- She states she has used tylenol and hydrocodone will minimial benefit. She did take a claritin today with 1 hydrocodone this morning but has not had comfortable relief.  This RN discussed with pt above symptoms and therapy related. regiman discussed with pt to use her pain med at 2 tabs every 6 hours and she can use ibuprophen with it for the next few days. She is to also increase the clariten to bid for the next 3 days as well.  This note will be left with CS/PA for review.

## 2011-09-24 NOTE — Progress Notes (Signed)
Referral MD Dr Luciano Cutter Dr  s Roseanne Reno  Reason for Referral: Locally advanced breast cancer , T3 N1    Chief Complaint  Patient presents with  . Follow-up  : This is a 51 year old woman who is seen in the multidisciplinary breast clinic today. She has been referred for consideration of neoadjuvant therapy. She presented with a left palpable mass underwent a mammogram which showed a mass in the upper-outer quadrant as well as left axillary lymph node. Ultrasound confirmed the presence of a large mass with any ipsilateral involved lymph node. The MRI showed this mass to be 5.2 x 2.9 x 2.5 cm. The prognostic profile indicated that I. invasive adenocarcinoma which was ER +80% PR +90% and HER-2 negative with a KI 6700%. Lymph node biopsy also positive.   HPI:   Past Medical History  Diagnosis Date  . Night sweats   . Fatigue   . Wears glasses   . SOB (shortness of breath) on exertion     walking and stairts  . Arthritis   . Hot flashes   . Blood transfusion   . Diverticulitis of colon   . Cancer     left breast cancer with chemo dx 1/13  :  Past Surgical History  Procedure Date  . Cholecystectomy   . Appendectomy   . Carpal tunnel release     Bilateral  . Tonsillectomy   . Spur     Apex spur on both big toes  . Knee surgery     Left Knee  . Portacath placement 06/18/2011    Procedure: INSERTION PORT-A-CATH;  Surgeon: Mariella Saa, MD;  Location: [facility];  Service: General;  Laterality: Right;  right subclavian  :  Current outpatient prescriptions:dexamethasone (DECADRON) 4 MG tablet, Take 2 tabs two times a day the day before Taxotere chemo. Then take 2 tabs daily starting the day after chemo for 2 days., Disp: 30 tablet, Rfl: 1;  gabapentin (NEURONTIN) 100 MG capsule, Take 100 mg by mouth 3 (three) times daily., Disp: , Rfl:  HYDROcodone-acetaminophen (VICODIN) 5-500 MG per tablet, Take 1 tablet by mouth every 6 (six) hours as needed. 1 po q6-8h prn  pain  #90 w/1 RF, 08/27/11-CTS, Disp: , Rfl: ;  lidocaine-prilocaine (EMLA) cream, Apply topically as directed. ONE TO TWO HOURS BEFORE TREATMENT., Disp: 60 g, Rfl: 1;  loratadine (CLARITIN) 10 MG tablet, Take 10 mg by mouth daily., Disp: , Rfl:  LORazepam (ATIVAN) 0.5 MG tablet, Take 1 tablet (0.5 mg total) by mouth every 6 (six) hours as needed (Nausea or vomiting)., Disp: 30 tablet, Rfl: 0;  ondansetron (ZOFRAN) 8 MG tablet, Take 1 tablet two times a day for 2 days starting the day after chemo, then take two times a day as needed for nausea or vomiting., Disp: 30 tablet, Rfl: 1 prochlorperazine (COMPAZINE) 10 MG tablet, Take 1 tablet (10 mg total) by mouth every 6 (six) hours as needed (Nausea or vomiting)., Disp: 30 tablet, Rfl: 1;  prochlorperazine (COMPAZINE) 25 MG suppository, Place 1 suppository (25 mg total) rectally every 12 (twelve) hours as needed for nausea., Disp: 12 suppository, Rfl: 3;  valACYclovir (VALTREX) 500 MG tablet, Take 500 mg by mouth 2 (two) times daily., Disp: , Rfl: :    :  Allergies  Allergen Reactions  . Compazine Other (See Comments)    Numbness of face and lips  . Penicillins Nausea Only  :  No family history on file.:  History   Social History  .  Marital Status: Single/divorced    Spouse Name: N/A    Number of Children: N/A  . Years of Education: N/A   Occupational History  . Not on file.       DAYCARE WORKER   Social History Main Topics  . Smoking status: Never Smoker   . Smokeless tobacco: Not on file  . Alcohol Use: No  . Drug Use: No  . Sexually Active: No   Other Topics Concern  . Not on file   Social History Narrative  . No narrative on file  :  @Reproductive  History@ GxP3, postmenopausal, no hx of HRT   A comprehensive review of systems was negative.  Exam:  @IPVITALS @ General appearance: alert, cooperative and appears stated age Eyes: conjunctivae/corneas clear. PERRL, EOM's intact. Fundi benign. Throat: lips, mucosa, and  tongue normal; teeth and gums normal Resp: clear to auscultation bilaterally Breasts: normal appearance, no masses or tenderness, B. right breast is normal left breast has a palpable mass appreciable Quadrant of the left breast measuring 4-5 cm. There may be a palpable lymph node appreciated in the same side. The other breast is normal as is the other axilla. Cardio: regular rate and rhythm, S1, S2 normal, no murmur, click, rub or gallop GI: soft, non-tender; bowel sounds normal; no masses,  no organomegaly Extremities: extremities normal, atraumatic, no cyanosis or edema Skin: Skin color, texture, turgor normal. No rashes or lesions Lymph nodes: Cervical, supraclavicular, and axillary nodes normal. Neurologic: Grossly normal  No results found for this basename: WBC:2,HGB:2,HCT:2,PLT:2 in the last 72 hours No results found for this basename: NA:2,K:2,CL:2,CO2:2,GLUCOSE:2,BUN:2,CREATININE:2,CALCIUM:2 in the last 72 hours  Blood smear review: n/a  Pathology:as above  Mr Breast Bilateral W Wo Contrast  09/05/2011  *RADIOLOGY REPORT*  Clinical Data: The patient is undergoing neoadjuvant treatment for known left breast cancer.  BILATERAL BREAST MRI WITH AND WITHOUT CONTRAST  Technique: Multiplanar, multisequence MR images of both breasts were obtained prior to and following the intravenous administration of 20ml of Multihance.  Three dimensional images were evaluated at the independent DynaCad workstation.  Comparison:  MRI 06/08/2011, mammogram and ultrasound 06/01/2011 and [facility]from [facility]  Findings: Previously, large area of enhancement was identified in the lateral portion of the left breast which measured 2.9 x 5.2 x 2.5 cm.  On the current study, no significant residual enhancement remains.  A clip is identified in the lateral portion of the left breast, placed at the time of ultrasound guided core biopsy.  Level I axillary lymph nodes are now significantly  smaller on the left. A few, small lymph nodes lack fatty hilum on the left.  These appear asymmetric compared to the right axillary lymph nodes but are quite small, measuring on the order of 9 mm in diameter.  Background parenchymal enhancement is minimal.  Images of the right breast are unremarkable.  On the right, no suspicious lymph nodes are identified.  The patient has a right-sided Port-A-Cath.  IMPRESSION:  1.  No significant residual enhancement in the area of known malignancy in the lateral portion of the left breast. 2.  Left axillary lymph nodes are now significantly smaller. Some of the level I axillary lymph nodes continue to have morphologic abnormality, suspicious for malignancy.  THREE-DIMENSIONAL MR IMAGE RENDERING ON INDEPENDENT WORKSTATION:  Three-dimensional MR images were rendered by post-processing of the original MR data on an independent workstation.  The three- dimensional MR images were interpreted, and findings were reported in the accompanying complete MRI report  for this study.  BI-RADS CATEGORY 6:  Known biopsy-proven malignancy - appropriate action should be taken.  Original Report Authenticated By: Patterson Hammersmith, M.D.    Assessment and Plan: Pleasant postmenopausal 51 year old woman who presents with locally advanced a minimally ER PR positive, HER-2 negative breast cancer with a high proliferative fraction. I did discuss the option of upfront chemotherapy with anthracycline and taxine based approach which would likely give rise to good response and hopefully her able to with breast conserving surgery. I discussed side effects chemotherapy only for Port-A-Cath placement. We will schedule her for staging scans, 2-D echo, chemotherapy teaching . I did review side effects of chemotherapy as well. I will plan to see her back in a few weeks to further define and confirm our schedule.

## 2011-09-27 ENCOUNTER — Ambulatory Visit (HOSPITAL_BASED_OUTPATIENT_CLINIC_OR_DEPARTMENT_OTHER)

## 2011-09-27 ENCOUNTER — Ambulatory Visit (HOSPITAL_BASED_OUTPATIENT_CLINIC_OR_DEPARTMENT_OTHER): Admitting: Physician Assistant

## 2011-09-27 ENCOUNTER — Ambulatory Visit

## 2011-09-27 ENCOUNTER — Other Ambulatory Visit (HOSPITAL_BASED_OUTPATIENT_CLINIC_OR_DEPARTMENT_OTHER): Admitting: Lab

## 2011-09-27 ENCOUNTER — Other Ambulatory Visit: Payer: Self-pay | Admitting: Physician Assistant

## 2011-09-27 VITALS — BP 121/81 | HR 97 | Temp 98.1°F | Ht 63.0 in | Wt 224.4 lb

## 2011-09-27 DIAGNOSIS — E860 Dehydration: Secondary | ICD-10-CM

## 2011-09-27 DIAGNOSIS — Z170 Estrogen receptor positive status [ER+]: Secondary | ICD-10-CM

## 2011-09-27 DIAGNOSIS — C50419 Malignant neoplasm of upper-outer quadrant of unspecified female breast: Secondary | ICD-10-CM

## 2011-09-27 DIAGNOSIS — B0229 Other postherpetic nervous system involvement: Secondary | ICD-10-CM

## 2011-09-27 DIAGNOSIS — R52 Pain, unspecified: Secondary | ICD-10-CM

## 2011-09-27 LAB — MANUAL DIFFERENTIAL
ALC: 1.9 10*3/uL (ref 0.9–3.3)
ANC (CHCC manual diff): 14.1 10*3/uL — ABNORMAL HIGH (ref 1.5–6.5)
Band Neutrophils: 13 % — ABNORMAL HIGH (ref 0–10)
Basophil: 0 % (ref 0–2)
Blasts: 0 % (ref 0–0)
EOS: 0 % (ref 0–7)
LYMPH: 10 % — ABNORMAL LOW (ref 14–49)
MONO: 16 % — ABNORMAL HIGH (ref 0–14)
Metamyelocytes: 6 % — ABNORMAL HIGH (ref 0–0)
Myelocytes: 22 % — ABNORMAL HIGH (ref 0–0)
Other Cell: 0 % (ref 0–0)
PLT EST: ADEQUATE
PROMYELO: 0 % (ref 0–0)
SEG: 33 % — ABNORMAL LOW (ref 38–77)
Variant Lymph: 0 % (ref 0–0)
nRBC: 0 % (ref 0–0)

## 2011-09-27 LAB — CBC WITH DIFFERENTIAL/PLATELET
HCT: 30.7 % — ABNORMAL LOW (ref 34.8–46.6)
HGB: 10.2 g/dL — ABNORMAL LOW (ref 11.6–15.9)
MCH: 29.1 pg (ref 25.1–34.0)
MCHC: 33.2 g/dL (ref 31.5–36.0)
MCV: 87.7 fL (ref 79.5–101.0)
Platelets: 178 10*3/uL (ref 145–400)
RBC: 3.5 10*6/uL — ABNORMAL LOW (ref 3.70–5.45)
RDW: 16.9 % — ABNORMAL HIGH (ref 11.2–14.5)
WBC: 19 10*3/uL — ABNORMAL HIGH (ref 3.9–10.3)

## 2011-09-27 MED ORDER — LIDOCAINE-PRILOCAINE 2.5-2.5 % EX CREA
TOPICAL_CREAM | Freq: Once | CUTANEOUS | Status: AC
  Administered 2011-09-27: 12:00:00 via TOPICAL

## 2011-09-27 MED ORDER — MORPHINE SULFATE 10 MG/ML IJ SOLN
2.5000 mg | Freq: Once | INTRAMUSCULAR | Status: AC
  Administered 2011-09-27: 2.5 mg via INTRAVENOUS

## 2011-09-27 MED ORDER — SODIUM CHLORIDE 0.9 % IV SOLN
Freq: Once | INTRAVENOUS | Status: AC
  Administered 2011-09-27: 13:00:00 via INTRAVENOUS

## 2011-09-27 MED ORDER — ONDANSETRON 8 MG/50ML IVPB (CHCC)
8.0000 mg | Freq: Once | INTRAVENOUS | Status: AC
  Administered 2011-09-27: 8 mg via INTRAVENOUS

## 2011-09-27 MED ORDER — FAMOTIDINE IN NACL 20-0.9 MG/50ML-% IV SOLN
20.0000 mg | Freq: Once | INTRAVENOUS | Status: AC
  Administered 2011-09-27: 20 mg via INTRAVENOUS

## 2011-09-27 MED ORDER — DEXAMETHASONE SODIUM PHOSPHATE 10 MG/ML IJ SOLN
10.0000 mg | Freq: Once | INTRAMUSCULAR | Status: AC
  Administered 2011-09-27: 10 mg via INTRAVENOUS

## 2011-09-27 NOTE — Progress Notes (Signed)
Hematology and Oncology Follow Up Visit  Kelsey Wilson [id number] Nov 28, 1960 51 y.o. 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 Completion 4/4 planned neoadjuvant dose dense FEC, after delay of cycle 4 due to herpes zoster of the left buttock and left upper thigh region. Neulasta support planned on and in in day 2. Excellent radiographic response per MRI obtained on 09/05/2011. Currently day 1 cycle 1/4 (n)DDTaxotere with Neulasta on day 2.  2. Herpes zoster of the left buttock and left upper thigh region on Valtrex 500 mg by mouth 3 times a day.  3. Symptoms suggesting postherpetic neuralgia, gabapentin 100 mg by mouth 3 times a day, improvement.   Interim History:   Kelsey Wilson is seen today in followup after her first of 4 planned doses of neoadjuvant dose dense Taxotere. She did receive Neulasta on day 2. She is feeling a bit off today, she has persistent nausea per se on an ongoing basis, but she's had low-grade nausea today. She also notes some abdominal cramping. She is having bowel movements though with assistance. She feels that she is getting enough fluids and, but upon asking her today it appears that she had less than 4 ounces of fluids. She denies frank fevers, but notes that she gets easily chilled. She has occasional night sweats. She also notes that her nailbeds are quite sensitive but she has not noted any redness of her hands or feet.  Review of systems is as listed below.  Review of Systems: Constitutional:  Improved. Eyes: No complaints ENT: No complaints Cardiovascular: no chest pain or dyspnea on exertion Respiratory: no cough, shortness of breath, or wheezing Neurological: no TIA or stroke symptoms Dermatological: negative Gastrointestinal: no abdominal pain, change in bowel habits, or black or bloody  stools Genito-Urinary: no dysuria, trouble voiding, or hematuria Hematological and Lymphatic: negative Breast: known L breast cancer Musculoskeletal: Pain on the left upper thigh region associated with known herpes zoster, improved with gabapentin. Remaining ROS negative.   Medications:   I have reviewed the patient's current medications.  Current Outpatient Prescriptions  Medication Sig Dispense Refill  . dexamethasone (DECADRON) 4 MG tablet Take 2 tabs two times a day the day before Taxotere chemo. Then take 2 tabs daily starting the day after chemo for 2 days.  30 tablet  1  . gabapentin (NEURONTIN) 100 MG capsule Take 100 mg by mouth 3 (three) times daily.      Marland Kitchen HYDROcodone-acetaminophen (VICODIN) 5-500 MG per tablet Take 1 tablet by mouth every 6 (six) hours as needed. 1 po q6-8h prn pain  #90 w/1 RF, 08/27/11-CTS      . lidocaine-prilocaine (EMLA) cream Apply topically as directed. ONE TO TWO HOURS BEFORE TREATMENT.  60 g  1  . loratadine (CLARITIN) 10 MG tablet Take 10 mg by mouth daily.      Marland Kitchen LORazepam (ATIVAN) 0.5 MG tablet Take 1 tablet (0.5 mg total) by mouth every 6 (six) hours as needed (Nausea or vomiting).  30 tablet  0  . ondansetron (ZOFRAN) 8 MG tablet Take 1 tablet two times a day for 2 days starting the day after chemo, then take two times a day as needed for nausea or vomiting.  30 tablet  1  . prochlorperazine (COMPAZINE) 10 MG tablet Take 1 tablet (10 mg total) by mouth every 6 (six) hours  as needed (Nausea or vomiting).  30 tablet  1  . prochlorperazine (COMPAZINE) 25 MG suppository Place 1 suppository (25 mg total) rectally every 12 (twelve) hours as needed for nausea.  12 suppository  3  . valACYclovir (VALTREX) 500 MG tablet Take 500 mg by mouth 2 (two) times daily.       Current Facility-Administered Medications  Medication Dose Route Frequency Provider Last Rate Last Dose  . 0.9 %  sodium chloride infusion   Intravenous Once Amada Kingfisher, PA      .  dexamethasone (DECADRON) injection 10 mg  10 mg Intravenous Once Amada Kingfisher, PA   10 mg at 09/27/11 1257  . famotidine (PEPCID) IVPB 20 mg  20 mg Intravenous Once Amada Kingfisher, PA      . lidocaine-prilocaine (EMLA) cream   Topical Once Amada Kingfisher, PA      . ondansetron (ZOFRAN) IVPB 8 mg  8 mg Intravenous Once Amada Kingfisher, PA   8 mg at 09/27/11 1257    Allergies:  Allergies  Allergen Reactions  . Compazine Other (See Comments)    Numbness of face and lips  . Penicillins Nausea Only    Physical Exam: Filed Vitals:   09/27/11 1144  BP: 121/81  Pulse: 97  Temp: 98.1 F (36.7 C)  Weight: 224 lbs. HEENT: Sclerae anicteric, conjunctivae pink. Oropharynx clear. Mild mucositis symptoms no oral candidiasis.  Nodes: No cervical, supraclavicular, or axillary lymphadenopathy palpated.  Breast Exam: Deferred.  Lungs: Clear to auscultation bilaterally. No crackles, rhonchi, or wheezes.  Heart: Regular rate and rhythm.  Abdomen: Soft, nontender. Positive bowel sounds. No organomegaly or masses palpated.  Musculoskeletal: No focal spinal tenderness to palpation.  Extremities: Benign. No peripheral edema or cyanosis.  Skin: Decreased elasticity suggesting mild dehydration. No frank nailbed dyscrasia. Neuro: Nonfocal, alert and oriented x 3.   Lab Results: Lab Results  Component Value Date   WBC 19.0* 09/27/2011   HGB 10.2* 09/27/2011   HCT 30.7* 09/27/2011   MCV 87.7 09/27/2011   PLT 178 09/27/2011   NEUTROABS 7.5* 09/20/2011     Chemistry      Component Value Date/Time   NA 141 09/20/2011 1113   K 3.8 09/20/2011 1113   CL 107 09/20/2011 1113   CO2 25 09/20/2011 1113   BUN 15 09/20/2011 1113   CREATININE 0.54 09/20/2011 1113      Component Value Date/Time   CALCIUM 9.2 09/20/2011 1113   ALKPHOS 54 09/20/2011 1113   AST 16 09/20/2011 1113   ALT 14 09/20/2011 1113   BILITOT 0.5 09/20/2011 1113     Radiographic data: 09/05/11 BILATERAL BREAST MRI WITH AND  WITHOUT CONTRAST  Technique: Multiplanar, multisequence MR images of both breasts were obtained prior to and following the intravenous administration of 20ml of Multihance. Three dimensional images were evaluated at the independent DynaCad workstation.  Comparison: MRI 06/08/2011, mammogram and ultrasound 06/01/2011 [facility] Imaging Findings: Previously, large area of enhancement was identified in the lateral portion of the left breast which measured 2.9 x 5.2 x 2.5 cm. On the current study, no significant residual enhancement remains. A clip is identified in the lateral portion of the left breast, placed at the time of ultrasound guided core biopsy. Level I axillary lymph nodes are now significantly smaller on the left. A few, small lymph nodes lack fatty hilum on the left. These appear asymmetric compared to the right axillary lymph nodes but are quite  small, measuring on the order of 9 mm in diameter. Background parenchymal enhancement is minimal. Images of the right breast are unremarkable. On the right, no suspicious lymph nodes are identified. The patient has a right-sided Port-A-Cath.  IMPRESSION:  1. No significant residual enhancement in the area of known  malignancy in the lateral portion of the left breast.  2. Left axillary lymph nodes are now significantly smaller. Some  of the level I axillary lymph nodes continue to have morphologic  abnormality, suspicious for malignancy.  Original Report Authenticated By: Patterson Hammersmith, M.D.   Assessment:  Ms. 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 Completion 4/4 planned neoadjuvant dose dense FEC, after delay of cycle 4 due to herpes zoster of the left buttock and left upper thigh region. Neulasta support planned on and in in day 2. Excellent radiographic  response per MRI obtained on 09/05/2011. Currently day 1 cycle 1/4 (n)DDTaxotere with Neulasta on day 2.  2. Herpes zoster of the left buttock and left upper thigh region on Valtrex 500 mg by mouth 3 times a day.  3. Symptoms suggesting postherpetic neuralgia, gabapentin 100 mg by mouth 3 times a day, improvement.  4. Dehydration secondary to persistant low grade nausea.  Case has been reviewed with Dr. Pierce Crane.  Plan:  Kelsey Wilson will receive a liter of normal saline today, in addition to IV Zofran, Decadron, and Pepcid.  I have encouraged her to push fluids at home, and to take ibuprofen prn.  I will see her in 1 weeks time, anticipating day 1 cycle 2 of 4 planned neoadjuvant dose dense Taxotere, but we may need to delay if her symptomatology persists. Kelsey Wilson to contact us though prior if the need should arise.  Patient understands and agrees with this plan. She Wilson to call with any changes or problems.    Kelsey Elrod T, PA-C 08/30/11

## 2011-09-27 NOTE — Patient Instructions (Signed)
[facility] Discharge Instructions for Patients Receiving Chemotherapy  Today you received the following  Normal saline 1 liter and zofran ,decadron and pepcid To help prevent nausea and vomiting after your treatment, we encourage you to take your nausea medication  Begin taking it and take it as often as prescribed    If you develop nausea and vomiting that is not controlled by your nausea medication, call the clinic. If it is after clinic hours your family physician or the after hours number for the clinic or go to the Emergency Department.   BELOW ARE SYMPTOMS THAT SHOULD BE REPORTED IMMEDIATELY:  *FEVER GREATER THAN 100.5 F  *CHILLS WITH OR WITHOUT FEVER  NAUSEA AND VOMITING THAT IS NOT CONTROLLED WITH YOUR NAUSEA MEDICATION  *UNUSUAL SHORTNESS OF BREATH  *UNUSUAL BRUISING OR BLEEDING  TENDERNESS IN MOUTH AND THROAT WITH OR WITHOUT PRESENCE OF ULCERS  *URINARY PROBLEMS  *BOWEL PROBLEMS  UNUSUAL RASH Items with * indicate a potential emergency and should be followed up as soon as possible.  One of the nurses will contact you 24 hours after your treatment. Please let the nurse know about any problems that you may have experienced. Feel free to call the clinic you have any questions or concerns. The clinic phone number is [phone].   I have been informed and understand all the instructions given to me. I know to contact the clinic, my physician, or go to the Emergency Department if any problems should occur. I do not have any questions at this time, but understand that I may call the clinic during office hours   should I have any questions or need assistance in obtaining follow up care.    __________________________________________  _____________  __________ Signature of Patient or Authorized Representative            Date                   Time    __________________________________________ Nurse's Signature

## 2011-09-28 ENCOUNTER — Ambulatory Visit

## 2011-10-03 ENCOUNTER — Other Ambulatory Visit: Payer: Self-pay | Admitting: Physician Assistant

## 2011-10-03 DIAGNOSIS — C50419 Malignant neoplasm of upper-outer quadrant of unspecified female breast: Secondary | ICD-10-CM

## 2011-10-04 ENCOUNTER — Ambulatory Visit (HOSPITAL_BASED_OUTPATIENT_CLINIC_OR_DEPARTMENT_OTHER)

## 2011-10-04 ENCOUNTER — Other Ambulatory Visit (HOSPITAL_BASED_OUTPATIENT_CLINIC_OR_DEPARTMENT_OTHER): Admitting: Lab

## 2011-10-04 ENCOUNTER — Encounter: Payer: Self-pay | Admitting: Oncology

## 2011-10-04 ENCOUNTER — Telehealth: Payer: Self-pay | Admitting: Oncology

## 2011-10-04 ENCOUNTER — Ambulatory Visit (HOSPITAL_BASED_OUTPATIENT_CLINIC_OR_DEPARTMENT_OTHER): Admitting: Physician Assistant

## 2011-10-04 VITALS — BP 117/82 | HR 80 | Temp 98.2°F | Ht 63.0 in | Wt 225.0 lb

## 2011-10-04 DIAGNOSIS — C50419 Malignant neoplasm of upper-outer quadrant of unspecified female breast: Secondary | ICD-10-CM

## 2011-10-04 DIAGNOSIS — Z5111 Encounter for antineoplastic chemotherapy: Secondary | ICD-10-CM

## 2011-10-04 DIAGNOSIS — Z170 Estrogen receptor positive status [ER+]: Secondary | ICD-10-CM

## 2011-10-04 LAB — LACTATE DEHYDROGENASE: LDH: 313 U/L — ABNORMAL HIGH (ref 94–250)

## 2011-10-04 LAB — CBC WITH DIFFERENTIAL/PLATELET
BASO%: 0.1 % (ref 0.0–2.0)
Basophils Absolute: 0 10*3/uL (ref 0.0–0.1)
EOS%: 0 % (ref 0.0–7.0)
Eosinophils Absolute: 0 10*3/uL (ref 0.0–0.5)
HCT: 30.4 % — ABNORMAL LOW (ref 34.8–46.6)
HGB: 10.2 g/dL — ABNORMAL LOW (ref 11.6–15.9)
LYMPH%: 4.5 % — ABNORMAL LOW (ref 14.0–49.7)
MCH: 29.4 pg (ref 25.1–34.0)
MCHC: 33.6 g/dL (ref 31.5–36.0)
MCV: 87.6 fL (ref 79.5–101.0)
MONO#: 0.5 10*3/uL (ref 0.1–0.9)
MONO%: 2.6 % (ref 0.0–14.0)
NEUT#: 17.1 10*3/uL — ABNORMAL HIGH (ref 1.5–6.5)
NEUT%: 92.8 % — ABNORMAL HIGH (ref 38.4–76.8)
Platelets: 145 10*3/uL (ref 145–400)
RBC: 3.47 10*6/uL — ABNORMAL LOW (ref 3.70–5.45)
RDW: 17.7 % — ABNORMAL HIGH (ref 11.2–14.5)
WBC: 18.4 10*3/uL — ABNORMAL HIGH (ref 3.9–10.3)
lymph#: 0.8 10*3/uL — ABNORMAL LOW (ref 0.9–3.3)
nRBC: 1 % — ABNORMAL HIGH (ref 0–0)

## 2011-10-04 LAB — COMPREHENSIVE METABOLIC PANEL
ALT: 18 U/L (ref 0–35)
AST: 16 U/L (ref 0–37)
Albumin: 4.3 g/dL (ref 3.5–5.2)
Alkaline Phosphatase: 66 U/L (ref 39–117)
BUN: 18 mg/dL (ref 6–23)
CO2: 28 mEq/L (ref 19–32)
Calcium: 9.7 mg/dL (ref 8.4–10.5)
Chloride: 102 mEq/L (ref 96–112)
Creatinine, Ser: 0.73 mg/dL (ref 0.50–1.10)
Glucose, Bld: 117 mg/dL — ABNORMAL HIGH (ref 70–99)
Potassium: 4 mEq/L (ref 3.5–5.3)
Sodium: 137 mEq/L (ref 135–145)
Total Bilirubin: 0.6 mg/dL (ref 0.3–1.2)
Total Protein: 6.6 g/dL (ref 6.0–8.3)

## 2011-10-04 MED ORDER — HEPARIN SOD (PORK) LOCK FLUSH 100 UNIT/ML IV SOLN
500.0000 [IU] | Freq: Once | INTRAVENOUS | Status: AC | PRN
  Administered 2011-10-04: 500 [IU]
  Filled 2011-10-04: qty 5

## 2011-10-04 MED ORDER — LORAZEPAM 1 MG PO TABS
0.5000 mg | ORAL_TABLET | Freq: Once | ORAL | Status: AC
  Administered 2011-10-04: 0.5 mg via ORAL

## 2011-10-04 MED ORDER — SODIUM CHLORIDE 0.9 % IV SOLN
Freq: Once | INTRAVENOUS | Status: AC
  Administered 2011-10-04: 12:00:00 via INTRAVENOUS

## 2011-10-04 MED ORDER — ONDANSETRON 8 MG/50ML IVPB (CHCC)
8.0000 mg | Freq: Once | INTRAVENOUS | Status: AC
  Administered 2011-10-04: 8 mg via INTRAVENOUS

## 2011-10-04 MED ORDER — SODIUM CHLORIDE 0.9 % IJ SOLN
10.0000 mL | INTRAMUSCULAR | Status: DC | PRN
  Administered 2011-10-04: 10 mL
  Filled 2011-10-04: qty 10

## 2011-10-04 MED ORDER — DEXAMETHASONE SODIUM PHOSPHATE 10 MG/ML IJ SOLN
10.0000 mg | Freq: Once | INTRAMUSCULAR | Status: AC
  Administered 2011-10-04: 10 mg via INTRAVENOUS

## 2011-10-04 MED ORDER — DOCETAXEL CHEMO INJECTION 160 MG/16ML
75.0000 mg/m2 | Freq: Once | INTRAVENOUS | Status: AC
  Administered 2011-10-04: 160 mg via INTRAVENOUS
  Filled 2011-10-04: qty 16

## 2011-10-04 NOTE — Telephone Encounter (Signed)
gve the pt her may 2013 appt calendar. Sent Leomia an staff message to add the chemo

## 2011-10-04 NOTE — Patient Instructions (Signed)
[facility] Discharge Instructions for Patients Receiving Chemotherapy  Today you received the following chemotherapy agents taxotere  To help prevent nausea and vomiting after your treatment, we encourage you to take your nausea medication   If you develop nausea and vomiting that is not controlled by your nausea medication, call the clinic. If it is after clinic hours your family physician or the after hours number for the clinic or go to the Emergency Department.   BELOW ARE SYMPTOMS THAT SHOULD BE REPORTED IMMEDIATELY:  *FEVER GREATER THAN 100.5 F  *CHILLS WITH OR WITHOUT FEVER  NAUSEA AND VOMITING THAT IS NOT CONTROLLED WITH YOUR NAUSEA MEDICATION  *UNUSUAL SHORTNESS OF BREATH  *UNUSUAL BRUISING OR BLEEDING  TENDERNESS IN MOUTH AND THROAT WITH OR WITHOUT PRESENCE OF ULCERS  *URINARY PROBLEMS  *BOWEL PROBLEMS  UNUSUAL RASH Items with * indicate a potential emergency and should be followed up as soon as possible.    I have been informed and understand all the instructions given to me. I know to contact the clinic, my physician, or go to the Emergency Department if any problems should occur. I do not have any questions at this time, but understand that I may call the clinic during office hours   should I have any questions or need assistance in obtaining follow up care.    __________________________________________  _____________  __________ Signature of Patient or Authorized Representative            Date                   Time    __________________________________________ Nurse's Signature

## 2011-10-04 NOTE — Progress Notes (Signed)
Patient came in today for lab,Md and chemo,I did meet with her back in the treatment area to go over her medicaid application with her, abigail had already started the application,I just wanted to meet with her to make sure she had everything on the the application fill out before I put it in the mail.I also gave her some disability information from abigail the Child psychotherapist.

## 2011-10-04 NOTE — Progress Notes (Signed)
Hematology and Oncology Follow Up Visit  Kelsey Wilson [id number] [birth year] 51 y.o. 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 Completion 4/4 planned neoadjuvant dose dense FEC, after delay of cycle 4 due to herpes zoster of the left buttock and left upper thigh region. Neulasta support planned on and in in day 2. Excellent radiographic response per MRI obtained on 09/05/2011. Due for day 1 cycle 2/4 neoadjuvant dose-dense Taxotere today. 2. History of severe afebrile neutropenia despite Neulasta on day 2.  3. Herpes zoster of the left buttock and left upper thigh region on Valtrex 500 mg by mouth 3 times a day, the patient discontinued. 4. Symptoms suggesting postherpetic neuralgia, gabapentin 100 mg by mouth 3 times a day, improvement.   Interim History:   Kelsey Wilson is seen today with her mother in accompaniment for followup prior to her second cycle of 4 planned neoadjuvant dose dense Taxotere with Neulasta support on day 2. She has taken dexamethasone per Taxotere protocol. Today she is feeling well, she denies any fevers, chills, or night sweats. No nausea, emesis, diarrhea, or constipation issues. She denies any shortness of breath or chest pain. No neuropathy symptoms. She does have some nail bed darkening but no discomfort. Energy level has improved. She has premedicated with dexamethasone per Taxotere protocol.  Review of Systems: Constitutional:  Improved. Eyes: No complaints ENT: No complaints Cardiovascular: no chest pain or dyspnea on exertion Respiratory: no cough, shortness of breath, or wheezing Neurological: no TIA or stroke symptoms Dermatological: negative Gastrointestinal: no abdominal pain, change in bowel habits, or black or bloody stools Genito-Urinary: no dysuria, trouble voiding, or  hematuria Hematological and Lymphatic: negative Breast: known L breast cancer Musculoskeletal: Pain on the left upper thigh region associated with known herpes zoster, improved with gabapentin. Remaining ROS negative.   Medications:   I have reviewed the patient's current medications.  Current Outpatient Prescriptions  Medication Sig Dispense Refill  . dexamethasone (DECADRON) 4 MG tablet Take 2 tabs two times a day the day before Taxotere chemo. Then take 2 tabs daily starting the day after chemo for 2 days.  30 tablet  1  . gabapentin (NEURONTIN) 100 MG capsule Take 100 mg by mouth 3 (three) times daily.      Marland Kitchen HYDROcodone-acetaminophen (VICODIN) 5-500 MG per tablet Take 1 tablet by mouth every 6 (six) hours as needed. 1 po q6-8h prn pain  #90 w/1 RF, 08/27/11-CTS      . lidocaine-prilocaine (EMLA) cream Apply topically as directed. ONE TO TWO HOURS BEFORE TREATMENT.  60 g  1  . loratadine (CLARITIN) 10 MG tablet Take 10 mg by mouth daily.      Marland Kitchen LORazepam (ATIVAN) 0.5 MG tablet Take 1 tablet (0.5 mg total) by mouth every 6 (six) hours as needed (Nausea or vomiting).  30 tablet  0  . ondansetron (ZOFRAN) 8 MG tablet Take 1 tablet two times a day for 2 days starting the day after chemo, then take two times a day as needed for nausea or vomiting.  30 tablet  1  . prochlorperazine (COMPAZINE) 10 MG tablet Take 1 tablet (10 mg total) by mouth every 6 (six) hours as needed (Nausea or vomiting).  30 tablet  1  . prochlorperazine (COMPAZINE) 25 MG suppository Place 1 suppository (25 mg total) rectally every 12 (twelve) hours as needed  for nausea.  12 suppository  3  . valACYclovir (VALTREX) 500 MG tablet Take 500 mg by mouth 2 (two) times daily.        Allergies:  Allergies  Allergen Reactions  . Compazine Other (See Comments)    Numbness of face and lips  . Penicillins Nausea Only    Physical Exam: Filed Vitals:   10/04/11 1118  BP: 117/82  Pulse: 80  Temp: 98.2 F (36.8 C)  Weight:  225 lbs. HEENT: Sclerae anicteric, conjunctivae pink. Oropharynx clear. No mucositis or candidiasis.  Nodes: No cervical, supraclavicular, or axillary lymphadenopathy palpated.  Breast Exam: Deferred.  Lungs: Clear to auscultation bilaterally. No crackles, rhonchi, or wheezes.  Heart: Regular rate and rhythm.  Abdomen: Soft, nontender. Positive bowel sounds. No organomegaly or masses palpated.  Musculoskeletal: No focal spinal tenderness to palpation.  Extremities: Benign. No peripheral edema or cyanosis.  Skin: The region on her left upper thigh anteriorly is continuing to fill in nicely. No evidence of erythema. Neuro: Nonfocal, alert and oriented x 3.   Lab Results: Lab Results  Component Value Date   WBC 18.4* 10/04/2011   HGB 10.2* 10/04/2011   HCT 30.4* 10/04/2011   MCV 87.6 10/04/2011   PLT 145 10/04/2011   NEUTROABS 17.1* 10/04/2011     Chemistry      Component Value Date/Time   NA 141 09/20/2011 1113   K 3.8 09/20/2011 1113   CL 107 09/20/2011 1113   CO2 25 09/20/2011 1113   BUN 15 09/20/2011 1113   CREATININE 0.54 09/20/2011 1113      Component Value Date/Time   CALCIUM 9.2 09/20/2011 1113   ALKPHOS 54 09/20/2011 1113   AST 16 09/20/2011 1113   ALT 14 09/20/2011 1113   BILITOT 0.5 09/20/2011 1113     Radiographic data: 09/05/11 BILATERAL BREAST MRI WITH AND WITHOUT CONTRAST  Technique: Multiplanar, multisequence MR images of both breasts were obtained prior to and following the intravenous administration of 20ml of Multihance. Three dimensional images were evaluated at the independent DynaCad workstation.  Comparison: MRI 06/08/2011, mammogram and ultrasound 06/01/2011 and [facility] Imaging Findings: Previously, large area of enhancement was identified in the lateral portion of the left breast which measured 2.9 x 5.2 x 2.5 cm. On the current study, no significant residual enhancement remains. A clip is identified in the lateral portion of the left  breast, placed at the time of ultrasound guided core biopsy. Level I axillary lymph nodes are now significantly smaller on the left. A few, small lymph nodes lack fatty hilum on the left. These appear asymmetric compared to the right axillary lymph nodes but are quite small, measuring on the order of 9 mm in diameter. Background parenchymal enhancement is minimal. Images of the right breast are unremarkable. On the right, no suspicious lymph nodes are identified. The patient has a right-sided Port-A-Cath.  IMPRESSION:  1. No significant residual enhancement in the area of known  malignancy in the lateral portion of the left breast.  2. Left axillary lymph nodes are now significantly smaller. Some  of the level I axillary lymph nodes continue to have morphologic  abnormality, suspicious for malignancy.  Original Report Authenticated By: Patterson Hammersmith, M.D.   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 Completion 4/4 planned neoadjuvant dose dense FEC, after delay  of cycle 4 due to herpes zoster of the left buttock and left upper thigh region. Neulasta support planned on and in in day 2. Excellent radiographic response per MRI obtained on 09/05/2011. Due to initiate her first cycle of neoadjuvant dose-dense Taxotere today. 2. History of severe afebrile neutropenia despite Neulasta on day 2.  3. Herpes zoster of the left buttock and left upper thigh region on Valtrex 500 mg by mouth 3 times a day, the patient discontinued. 4. Symptoms suggesting postherpetic neuralgia, gabapentin 100 mg by mouth 3 times a day, improvement.  Case has been reviewed with Dr. Pierce Crane.  Plan:  Kairah will receive day 1 cycle 2/4 (n)DDTaxotere today as scheduled, to return tomorrow for Neulasta. She will take her Decadron post-Taxotere therapy per  protocol. I will see her in one week's time for nadir assessment, then in 2 weeks time prior to day 1 cycle 3 of neoadjuvant dose dense Taxotere.  Jaquaya knows to contact us though prior if the need should arise.  Patient understands and agrees with this plan. She knows to call with any changes or problems.    Kristen Bushway T, PA-C 10/04/11

## 2011-10-05 ENCOUNTER — Ambulatory Visit (HOSPITAL_BASED_OUTPATIENT_CLINIC_OR_DEPARTMENT_OTHER)

## 2011-10-05 VITALS — BP 114/77 | HR 80 | Temp 97.6°F

## 2011-10-05 DIAGNOSIS — Z5189 Encounter for other specified aftercare: Secondary | ICD-10-CM

## 2011-10-05 DIAGNOSIS — C50419 Malignant neoplasm of upper-outer quadrant of unspecified female breast: Secondary | ICD-10-CM

## 2011-10-05 MED ORDER — PEGFILGRASTIM INJECTION 6 MG/0.6ML
6.0000 mg | Freq: Once | SUBCUTANEOUS | Status: AC
  Administered 2011-10-05: 6 mg via SUBCUTANEOUS

## 2011-10-10 ENCOUNTER — Other Ambulatory Visit: Payer: Self-pay | Admitting: *Deleted

## 2011-10-10 DIAGNOSIS — C50419 Malignant neoplasm of upper-outer quadrant of unspecified female breast: Secondary | ICD-10-CM

## 2011-10-10 MED ORDER — LORAZEPAM 0.5 MG PO TABS: 0.5000 mg | ORAL_TABLET | Freq: Four times a day (QID) | ORAL | Status: DC | PRN

## 2011-10-10 MED ORDER — ONDANSETRON HCL 8 MG PO TABS: ORAL_TABLET | ORAL | Status: DC

## 2011-10-10 MED ORDER — GABAPENTIN 100 MG PO CAPS: 300.0000 mg | ORAL_CAPSULE | Freq: Three times a day (TID) | ORAL | Status: DC

## 2011-10-11 ENCOUNTER — Ambulatory Visit (INDEPENDENT_AMBULATORY_CARE_PROVIDER_SITE_OTHER): Admitting: General Surgery

## 2011-10-11 ENCOUNTER — Ambulatory Visit (HOSPITAL_BASED_OUTPATIENT_CLINIC_OR_DEPARTMENT_OTHER): Admitting: Physician Assistant

## 2011-10-11 ENCOUNTER — Encounter (INDEPENDENT_AMBULATORY_CARE_PROVIDER_SITE_OTHER): Payer: Self-pay | Admitting: General Surgery

## 2011-10-11 ENCOUNTER — Other Ambulatory Visit (HOSPITAL_BASED_OUTPATIENT_CLINIC_OR_DEPARTMENT_OTHER): Admitting: Lab

## 2011-10-11 VITALS — BP 117/79 | HR 96 | Temp 97.9°F | Ht 63.0 in | Wt 220.6 lb

## 2011-10-11 VITALS — BP 128/89 | HR 89 | Temp 98.1°F | Ht 63.0 in | Wt 222.2 lb

## 2011-10-11 DIAGNOSIS — M899 Disorder of bone, unspecified: Secondary | ICD-10-CM

## 2011-10-11 DIAGNOSIS — C50419 Malignant neoplasm of upper-outer quadrant of unspecified female breast: Secondary | ICD-10-CM

## 2011-10-11 DIAGNOSIS — D709 Neutropenia, unspecified: Secondary | ICD-10-CM

## 2011-10-11 DIAGNOSIS — C50919 Malignant neoplasm of unspecified site of unspecified female breast: Secondary | ICD-10-CM

## 2011-10-11 DIAGNOSIS — B029 Zoster without complications: Secondary | ICD-10-CM

## 2011-10-11 LAB — CBC WITH DIFFERENTIAL/PLATELET
BASO%: 0.8 % (ref 0.0–2.0)
Basophils Absolute: 0.1 10*3/uL (ref 0.0–0.1)
EOS%: 0.2 % (ref 0.0–7.0)
Eosinophils Absolute: 0 10*3/uL (ref 0.0–0.5)
HCT: 31.4 % — ABNORMAL LOW (ref 34.8–46.6)
HGB: 10.4 g/dL — ABNORMAL LOW (ref 11.6–15.9)
LYMPH%: 18.3 % (ref 14.0–49.7)
MCH: 29.1 pg (ref 25.1–34.0)
MCHC: 33.1 g/dL (ref 31.5–36.0)
MCV: 87.7 fL (ref 79.5–101.0)
MONO#: 1.4 10*3/uL — ABNORMAL HIGH (ref 0.1–0.9)
MONO%: 23.6 % — ABNORMAL HIGH (ref 0.0–14.0)
NEUT#: 3.5 10*3/uL (ref 1.5–6.5)
NEUT%: 57.1 % (ref 38.4–76.8)
Platelets: 134 10*3/uL — ABNORMAL LOW (ref 145–400)
RBC: 3.58 10*6/uL — ABNORMAL LOW (ref 3.70–5.45)
RDW: 16.9 % — ABNORMAL HIGH (ref 11.2–14.5)
WBC: 6.1 10*3/uL (ref 3.9–10.3)
lymph#: 1.1 10*3/uL (ref 0.9–3.3)
nRBC: 1 % — ABNORMAL HIGH (ref 0–0)

## 2011-10-11 NOTE — Progress Notes (Signed)
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 She was HER-2 negative and weekly ER positive. She had a 5 cm tumor by MR and a positive biopsy of an enlarged axillary lymph node. This was located in the tail of O'Brien. She has undergone neoadjuvant chemotherapy. She has tolerated this reasonably well. MR was recently performed which has shown complete resolution of the abnormality in the breast and improvement in the axillary lymph nodes although several remain mildly abnormal.  Past Medical History  Diagnosis Date  . Night sweats   . Fatigue   . Wears glasses   . SOB (shortness of breath) on exertion     walking and stairts  . Arthritis   . Hot flashes   . Blood transfusion   . Diverticulitis of colon   . Cancer     left breast cancer with chemo dx 1/13   Past Surgical History  Procedure Date  . Cholecystectomy   . Appendectomy   . Carpal tunnel release     Bilateral  . Tonsillectomy   . Spur     Apex spur on both big toes  . Knee surgery     Left Knee  . Portacath placement 06/18/2011    Procedure: INSERTION PORT-A-CATH;  Surgeon: Mariella Saa, MD;  Location: [facility];  Service: General;  Laterality: Right;  right subclavian   Current Outpatient Prescriptions  Medication Sig Dispense Refill  . dexamethasone (DECADRON) 4 MG tablet Take 2 tabs two times a day the day before Taxotere chemo. Then take 2 tabs daily starting the day after chemo for 2 days.  30 tablet  1  . gabapentin (NEURONTIN) 100 MG capsule Take 3 capsules (300 mg total) by mouth 3 (three) times daily.  90 capsule  2  . HYDROcodone-acetaminophen (VICODIN) 5-500 MG per tablet Take 1 tablet by mouth every 6 (six) hours as needed. 1 po q6-8h prn pain  #90 w/1 RF, 08/27/11-CTS      . lidocaine-prilocaine (EMLA) cream Apply topically as directed. ONE TO TWO HOURS BEFORE TREATMENT.  60 g  1  . loratadine  (CLARITIN) 10 MG tablet Take 10 mg by mouth daily.      Marland Kitchen LORazepam (ATIVAN) 0.5 MG tablet Take 1 tablet (0.5 mg total) by mouth every 6 (six) hours as needed (Nausea or vomiting).  30 tablet  0  . ondansetron (ZOFRAN) 8 MG tablet Take 1 tablet two times a day for 2 days starting the day after chemo, then take two times a day as needed for nausea or vomiting.  30 tablet  1  . prochlorperazine (COMPAZINE) 10 MG tablet Take 1 tablet (10 mg total) by mouth every 6 (six) hours as needed (Nausea or vomiting).  30 tablet  1  . prochlorperazine (COMPAZINE) 25 MG suppository Place 1 suppository (25 mg total) rectally every 12 (twelve) hours as needed for nausea.  12 suppository  3  . valACYclovir (VALTREX) 500 MG tablet Take 500 mg by mouth 2 (two) times daily.       Allergies  Allergen Reactions  . Compazine Other (See Comments)    Numbness of face and lips  . Penicillins Nausea Only     Exam:  Gen.: Moderately obese no acute distress HEENT: Alopecia Lymph nodes: No palpable cervical, supraclavicular or axillary nodes Breasts: I cannot feel any masses in either breast particular attention to the tail of Spence  on the left Abdomen: Soft and nontender. No organomegaly Extremities: No edema  Assessment and plan: T3 N1 HER-2 negative weekly ER positive high-grade cancer of the tail of Spence of the left breast with essentially complete radiologic response to neoadjuvant chemotherapy. We discussed her previous plan to proceed with needle localized left breast lumpectomy and axillary dissection. I discussed the procedure in detail including alternatives and risks of anesthetic complications, bleeding, infection, lymphedema. All her questions were answered. Her last chemotherapy is due at the end of May and we will try to schedule her surgery about the end of June.

## 2011-10-11 NOTE — Progress Notes (Signed)
Hematology and Oncology Follow Up Visit  Kelsey Wilson [id number] 09/28/1960 51 y.o. 10/11/11   HPI: Kelsey Wilson is a 51 year old [place] woman with a locally advanced clinical T3 N1 left breast carcinoma ER/PR positive at 8/9% respectively, HER-2 negative with a Ki-67 of 100%. Completion 4/4 planned neoadjuvant dose dense FEC, after delay of cycle 4 due to herpes zoster of the left buttock and left upper thigh region. Neulasta support planned on and in in day 2. Excellent radiographic response per MRI obtained on 09/05/2011. Currently day 7 cycle 2/4 neoadjuvant dose-dense Taxotere today. 2. History of severe afebrile neutropenia despite Neulasta on day 2.  3. Herpes zoster of the left buttock and left upper thigh region on Valtrex 500 mg by mouth 3 times a day, the patient discontinued. 4. Symptoms suggesting postherpetic neuralgia, gabapentin 100 mg by mouth 3 times a day, improvement.   Interim History:   Kelsey Wilson is seen today with her mother in accompaniment for followup after her second cycle of 4 planned neoadjuvant dose dense Taxotere with Neulasta support on day 2. She has taken dexamethasone per Taxotere protocol. Today she is feeling ok, and has noted some diffuse bony pain associated with Neulasta injection. Has not been responsive to Vicodin. She also does describe some "numbness" of her fingertips and feet, she also describes a vague sensation around her oral region. She has had occasional headaches, but just aches overall in general. She denies frank photosensitivity. No nausea issues with, no diarrhea or constipation issues.  Review of Systems: Constitutional:  Fatigued. Eyes: No complaints ENT: No complaints Cardiovascular: no chest pain or dyspnea on exertion Respiratory: no cough, shortness of breath, or wheezing Neurological: no TIA or stroke symptoms Dermatological: negative Gastrointestinal: no abdominal pain, change in bowel habits, or black or  bloody stools Genito-Urinary: no dysuria, trouble voiding, or hematuria Hematological and Lymphatic: negative Breast: known L breast cancer Musculoskeletal: Pain on the left upper thigh region associated with known herpes zoster, improved with gabapentin. Remaining ROS negative.   Medications:   I have reviewed the patient's current medications.  Current Outpatient Prescriptions  Medication Sig Dispense Refill  . dexamethasone (DECADRON) 4 MG tablet Take 2 tabs two times a day the day before Taxotere chemo. Then take 2 tabs daily starting the day after chemo for 2 days.  30 tablet  1  . gabapentin (NEURONTIN) 100 MG capsule Take 3 capsules (300 mg total) by mouth 3 (three) times daily.  90 capsule  2  . HYDROcodone-acetaminophen (VICODIN) 5-500 MG per tablet Take 1 tablet by mouth every 6 (six) hours as needed. 1 po q6-8h prn pain  #90 w/1 RF, 08/27/11-CTS      . lidocaine-prilocaine (EMLA) cream Apply topically as directed. ONE TO TWO HOURS BEFORE TREATMENT.  60 g  1  . loratadine (CLARITIN) 10 MG tablet Take 10 mg by mouth daily.      Marland Kitchen LORazepam (ATIVAN) 0.5 MG tablet Take 1 tablet (0.5 mg total) by mouth every 6 (six) hours as needed (Nausea or vomiting).  30 tablet  0  . ondansetron (ZOFRAN) 8 MG tablet Take 1 tablet two times a day for 2 days starting the day after chemo, then take two times a day as needed for nausea or vomiting.  30 tablet  1  . prochlorperazine (COMPAZINE) 10 MG tablet Take 1 tablet (10 mg total) by mouth every 6 (six) hours as needed (Nausea or vomiting).  30 tablet  1  . prochlorperazine (COMPAZINE) 25  MG suppository Place 1 suppository (25 mg total) rectally every 12 (twelve) hours as needed for nausea.  12 suppository  3  . valACYclovir (VALTREX) 500 MG tablet Take 500 mg by mouth 2 (two) times daily.        Allergies:  Allergies  Allergen Reactions  . Compazine Other (See Comments)    Numbness of face and lips  . Penicillins Nausea Only    Physical  Exam: Filed Vitals:   10/11/11 1213  BP: 117/79  Pulse: 96  Temp: 97.9 F (36.6 C)  Weight: 220 lbs. HEENT: Sclerae anicteric, conjunctivae pink. Oropharynx clear. No mucositis or candidiasis.  Nodes: No cervical, supraclavicular, or axillary lymphadenopathy palpated.  Breast Exam: Deferred.  Lungs: Clear to auscultation bilaterally. No crackles, rhonchi, or wheezes.  Heart: Regular rate and rhythm.  Abdomen: Soft, nontender. Positive bowel sounds. No organomegaly or masses palpated.  Musculoskeletal: No focal spinal tenderness to palpation.  Extremities: Benign. No peripheral edema or cyanosis.  Skin: The region on her left upper thigh anteriorly is continuing to fill in nicely. No evidence of erythema. Neuro: Nonfocal, alert and oriented x 3.   Lab Results: Lab Results  Component Value Date   WBC 6.1 10/11/2011   HGB 10.4* 10/11/2011   HCT 31.4* 10/11/2011   MCV 87.7 10/11/2011   PLT 134* 10/11/2011   NEUTROABS 3.5 10/11/2011     Chemistry      Component Value Date/Time   NA 137 10/04/2011 1058   K 4.0 10/04/2011 1058   CL 102 10/04/2011 1058   CO2 28 10/04/2011 1058   BUN 18 10/04/2011 1058   CREATININE 0.73 10/04/2011 1058      Component Value Date/Time   CALCIUM 9.7 10/04/2011 1058   ALKPHOS 66 10/04/2011 1058   AST 16 10/04/2011 1058   ALT 18 10/04/2011 1058   BILITOT 0.6 10/04/2011 1058     Radiographic data: 09/05/11 BILATERAL BREAST MRI WITH AND WITHOUT CONTRAST  Technique: Multiplanar, multisequence MR images of both breasts were obtained prior to and following the intravenous administration of 20ml of Multihance. Three dimensional images were evaluated at the independent DynaCad workstation.  Comparison: MRI 06/08/2011, mammogram and ultrasound 06/01/2011 [facility] Imaging Findings: Previously, large area of enhancement was identified in the lateral portion of the left breast which measured 2.9 x 5.2 x 2.5 cm. On the current study, no significant  residual enhancement remains. A clip is identified in the lateral portion of the left breast, placed at the time of ultrasound guided core biopsy. Level I axillary lymph nodes are now significantly smaller on the left. A few, small lymph nodes lack fatty hilum on the left. These appear asymmetric compared to the right axillary lymph nodes but are quite small, measuring on the order of 9 mm in diameter. Background parenchymal enhancement is minimal. Images of the right breast are unremarkable. On the right, no suspicious lymph nodes are identified. The patient has a right-sided Port-A-Cath.  IMPRESSION:  1. No significant residual enhancement in the area of known  malignancy in the lateral portion of the left breast.  2. Left axillary lymph nodes are now significantly smaller. Some  of the level I axillary lymph nodes continue to have morphologic  abnormality, suspicious for malignancy.  Original Report Authenticated By: Patterson Hammersmith, M.D.   Assessment:  [name] is a 51 year old [place] woman with a locally advanced clinical T3 N1 left breast carcinoma ER/PR positive at 8/9% respectively, HER-2  negative with a Ki-67 of 100%. Completion 4/4 planned neoadjuvant dose dense FEC, after delay of cycle 4 due to herpes zoster of the left buttock and left upper thigh region. Neulasta support planned on and in in day 2. Excellent radiographic response per MRI obtained on 09/05/2011. Currently day 7 cycle 2/4 neoadjuvant dose-dense Taxotere. 2. History of severe afebrile neutropenia despite Neulasta on day 2.  3. Herpes zoster of the left buttock and left upper thigh region on Valtrex 500 mg by mouth 3 times a day, the patient discontinued. 4. Symptoms suggesting postherpetic neuralgia, gabapentin 300 mg by mouth 3 times a day, improvement.  Case to be reviewed with Dr. Pierce Crane.  Plan:  Tata will return on 10/18/11 for labs and exam prior to  Day 1 cycle 3/4 (n)DDTaxotere.  Prescription for Ultram was provided in the hopes that it may alleviate some of her diffuse bony discomfort which has been unresponsive to Vicodin.   Helayne knows to contact us though prior if the need should arise.  Patient understands and agrees with this plan. She knows to call with any changes or problems.    Mackay Hanauer T, PA-C 10/11/11

## 2011-10-17 ENCOUNTER — Other Ambulatory Visit: Payer: Self-pay | Admitting: Physician Assistant

## 2011-10-17 ENCOUNTER — Other Ambulatory Visit (INDEPENDENT_AMBULATORY_CARE_PROVIDER_SITE_OTHER): Payer: Self-pay | Admitting: General Surgery

## 2011-10-17 DIAGNOSIS — C50419 Malignant neoplasm of upper-outer quadrant of unspecified female breast: Secondary | ICD-10-CM

## 2011-10-18 ENCOUNTER — Ambulatory Visit (HOSPITAL_BASED_OUTPATIENT_CLINIC_OR_DEPARTMENT_OTHER): Admitting: Physician Assistant

## 2011-10-18 ENCOUNTER — Other Ambulatory Visit (HOSPITAL_BASED_OUTPATIENT_CLINIC_OR_DEPARTMENT_OTHER): Admitting: Lab

## 2011-10-18 ENCOUNTER — Ambulatory Visit (HOSPITAL_BASED_OUTPATIENT_CLINIC_OR_DEPARTMENT_OTHER)

## 2011-10-18 VITALS — BP 111/72 | HR 83 | Temp 97.6°F | Ht 63.0 in | Wt 225.1 lb

## 2011-10-18 DIAGNOSIS — Z5111 Encounter for antineoplastic chemotherapy: Secondary | ICD-10-CM

## 2011-10-18 DIAGNOSIS — Z170 Estrogen receptor positive status [ER+]: Secondary | ICD-10-CM

## 2011-10-18 DIAGNOSIS — C50419 Malignant neoplasm of upper-outer quadrant of unspecified female breast: Secondary | ICD-10-CM

## 2011-10-18 DIAGNOSIS — B029 Zoster without complications: Secondary | ICD-10-CM

## 2011-10-18 LAB — COMPREHENSIVE METABOLIC PANEL
ALT: 10 U/L (ref 0–35)
AST: 15 U/L (ref 0–37)
Albumin: 3.9 g/dL (ref 3.5–5.2)
Alkaline Phosphatase: 64 U/L (ref 39–117)
BUN: 14 mg/dL (ref 6–23)
CO2: 28 mEq/L (ref 19–32)
Calcium: 8.8 mg/dL (ref 8.4–10.5)
Chloride: 102 mEq/L (ref 96–112)
Creatinine, Ser: 0.59 mg/dL (ref 0.50–1.10)
Glucose, Bld: 99 mg/dL (ref 70–99)
Potassium: 3.3 mEq/L — ABNORMAL LOW (ref 3.5–5.3)
Sodium: 137 mEq/L (ref 135–145)
Total Bilirubin: 0.6 mg/dL (ref 0.3–1.2)
Total Protein: 5.9 g/dL — ABNORMAL LOW (ref 6.0–8.3)

## 2011-10-18 LAB — CBC WITH DIFFERENTIAL/PLATELET
BASO%: 0.1 % (ref 0.0–2.0)
Basophils Absolute: 0 10*3/uL (ref 0.0–0.1)
EOS%: 0.1 % (ref 0.0–7.0)
Eosinophils Absolute: 0 10*3/uL (ref 0.0–0.5)
HCT: 28.9 % — ABNORMAL LOW (ref 34.8–46.6)
HGB: 9.6 g/dL — ABNORMAL LOW (ref 11.6–15.9)
LYMPH%: 6.2 % — ABNORMAL LOW (ref 14.0–49.7)
MCH: 29.4 pg (ref 25.1–34.0)
MCHC: 33.2 g/dL (ref 31.5–36.0)
MCV: 88.4 fL (ref 79.5–101.0)
MONO#: 0.5 10*3/uL (ref 0.1–0.9)
MONO%: 3.3 % (ref 0.0–14.0)
NEUT#: 14.5 10*3/uL — ABNORMAL HIGH (ref 1.5–6.5)
NEUT%: 90.3 % — ABNORMAL HIGH (ref 38.4–76.8)
Platelets: 196 10*3/uL (ref 145–400)
RBC: 3.27 10*6/uL — ABNORMAL LOW (ref 3.70–5.45)
RDW: 17.3 % — ABNORMAL HIGH (ref 11.2–14.5)
WBC: 16.1 10*3/uL — ABNORMAL HIGH (ref 3.9–10.3)
lymph#: 1 10*3/uL (ref 0.9–3.3)
nRBC: 0 % (ref 0–0)

## 2011-10-18 LAB — LACTATE DEHYDROGENASE: LDH: 271 U/L — ABNORMAL HIGH (ref 94–250)

## 2011-10-18 MED ORDER — SODIUM CHLORIDE 0.9 % IV SOLN
Freq: Once | INTRAVENOUS | Status: AC
  Administered 2011-10-18: 12:00:00 via INTRAVENOUS

## 2011-10-18 MED ORDER — HEPARIN SOD (PORK) LOCK FLUSH 100 UNIT/ML IV SOLN
500.0000 [IU] | Freq: Once | INTRAVENOUS | Status: AC | PRN
  Administered 2011-10-18: 500 [IU]
  Filled 2011-10-18: qty 5

## 2011-10-18 MED ORDER — LORAZEPAM 2 MG/ML IJ SOLN
0.5000 mg | Freq: Once | INTRAMUSCULAR | Status: AC
  Administered 2011-10-18: 0.5 mg via INTRAVENOUS

## 2011-10-18 MED ORDER — SODIUM CHLORIDE 0.9 % IJ SOLN
10.0000 mL | INTRAMUSCULAR | Status: DC | PRN
  Administered 2011-10-18: 10 mL
  Filled 2011-10-18: qty 10

## 2011-10-18 MED ORDER — DEXAMETHASONE SODIUM PHOSPHATE 10 MG/ML IJ SOLN
10.0000 mg | Freq: Once | INTRAMUSCULAR | Status: AC
  Administered 2011-10-18: 10 mg via INTRAVENOUS

## 2011-10-18 MED ORDER — ONDANSETRON 8 MG/50ML IVPB (CHCC)
8.0000 mg | Freq: Once | INTRAVENOUS | Status: AC
  Administered 2011-10-18: 8 mg via INTRAVENOUS

## 2011-10-18 MED ORDER — DOCETAXEL CHEMO INJECTION 160 MG/16ML
75.0000 mg/m2 | Freq: Once | INTRAVENOUS | Status: AC
  Administered 2011-10-18: 160 mg via INTRAVENOUS
  Filled 2011-10-18: qty 16

## 2011-10-18 NOTE — Patient Instructions (Signed)
1. Lubricating eye drops for sensitive eyes, use frequently.  2. Vit. B complex ("regular strength"): take 1 per day.

## 2011-10-18 NOTE — Patient Instructions (Signed)
[facility] Discharge Instructions for Patients Receiving Chemotherapy  Today you received the following chemotherapy agent Taxotere.  To help prevent nausea and vomiting after your treatment, we encourage you to take your nausea medication. Begin taking it as often as prescribed by your physician.  If you develop nausea and vomiting that is not controlled by your nausea medication, call the clinic. If it is after clinic hours your family physician or the after hours number for the clinic or go to the Emergency Department.   BELOW ARE SYMPTOMS THAT SHOULD BE REPORTED IMMEDIATELY:  *FEVER GREATER THAN 100.5 F  *CHILLS WITH OR WITHOUT FEVER  NAUSEA AND VOMITING THAT IS NOT CONTROLLED WITH YOUR NAUSEA MEDICATION  *UNUSUAL SHORTNESS OF BREATH  *UNUSUAL BRUISING OR BLEEDING  TENDERNESS IN MOUTH AND THROAT WITH OR WITHOUT PRESENCE OF ULCERS  *URINARY PROBLEMS  *BOWEL PROBLEMS  UNUSUAL RASH Items with * indicate a potential emergency and should be followed up as soon as possible.  One of the nurses will contact you 24 hours after your treatment. Please let the nurse know about any problems that you may have experienced. Feel free to call the clinic you have any questions or concerns. The clinic phone number is [phone].   I have been informed and understand all the instructions given to me. I know to contact the clinic, my physician, or go to the Emergency Department if any problems should occur. I do not have any questions at this time, but understand that I may call the clinic during office hours   should I have any questions or need assistance in obtaining follow up care.    __________________________________________  _____________  __________ Signature of Patient or Authorized Representative            Date                   Time    __________________________________________ Nurse's Signature

## 2011-10-18 NOTE — Progress Notes (Signed)
Hematology and Oncology Follow Up Visit  MYKAYLA BRINTON [id number] 18-May-1961 51 y.o. 10/18/11   HPI: Ms. Ancona is a 51 year old [place] woman with a locally advanced clinical T3 N1 left breast carcinoma ER/PR positive at 8/9% respectively, HER-2 negative with a Ki-67 of 100%. Completion 4/4 planned neoadjuvant dose dense FEC, after delay of cycle 4 due to herpes zoster of the left buttock and left upper thigh region. Neulasta support planned on and in in day 2. Excellent radiographic response per MRI obtained on 09/05/2011. Due for day 1 cycle 3/4 neoadjuvant dose-dense Taxotere today.  2. History of severe afebrile neutropenia despite Neulasta on day 2.   3. Herpes zoster of the left buttock and left upper thigh region on Valtrex 500 mg by mouth 3 times a day, the patient discontinued.  4. Symptoms suggesting postherpetic neuralgia, gabapentin 100 mg by mouth 3 times a day, improvement.   Interim History:   Aveline is seen today with her mother in accompaniment for followup prior to her third cycle of 4 planned neoadjuvant dose dense Taxotere with Neulasta support on day 2. She has taken dexamethasone per Taxotere protocol. Today she is feeling well, she denies any fevers, chills, or night sweats. No nausea, emesis, diarrhea, or constipation issues. She denies any shortness of breath or chest pain. No neuropathy symptoms. She does have some nail bed darkening and slight discomfort. She does also note that Ultram has provided better pain control and no headaches as compared to Vicodin. Energy level has improved. She has premedicated with dexamethasone per Taxotere protocol.  Review of Systems: Constitutional:  Improved. Eyes: No complaints ENT: No complaints Cardiovascular: no chest pain or dyspnea on exertion Respiratory: no cough, shortness of breath, or wheezing Neurological: no TIA or stroke symptoms Dermatological: negative Gastrointestinal: no abdominal pain,  change in bowel habits, or black or bloody stools Genito-Urinary: no dysuria, trouble voiding, or hematuria Hematological and Lymphatic: negative Breast: known L breast cancer Musculoskeletal: Pain on the left upper thigh region associated with known herpes zoster, improved with gabapentin. Remaining ROS negative.   Medications:   I have reviewed the patient's current medications.  Current Outpatient Prescriptions  Medication Sig Dispense Refill  . dexamethasone (DECADRON) 4 MG tablet Take 2 tabs two times a day the day before Taxotere chemo. Then take 2 tabs daily starting the day after chemo for 2 days.  30 tablet  1  . gabapentin (NEURONTIN) 100 MG capsule Take 3 capsules (300 mg total) by mouth 3 (three) times daily.  90 capsule  2  . HYDROcodone-acetaminophen (VICODIN) 5-500 MG per tablet Take 1 tablet by mouth every 6 (six) hours as needed. 1 po q6-8h prn pain  #90 w/1 RF, 08/27/11-CTS      . lidocaine-prilocaine (EMLA) cream Apply topically as directed. ONE TO TWO HOURS BEFORE TREATMENT.  60 g  1  . loratadine (CLARITIN) 10 MG tablet Take 10 mg by mouth daily.      Marland Kitchen LORazepam (ATIVAN) 0.5 MG tablet Take 1 tablet (0.5 mg total) by mouth every 6 (six) hours as needed (Nausea or vomiting).  30 tablet  0  . ondansetron (ZOFRAN) 8 MG tablet Take 1 tablet two times a day for 2 days starting the day after chemo, then take two times a day as needed for nausea or vomiting.  30 tablet  1  . prochlorperazine (COMPAZINE) 10 MG tablet Take 1 tablet (10 mg total) by mouth every 6 (six) hours as needed (Nausea or vomiting).  30 tablet  1  . prochlorperazine (COMPAZINE) 25 MG suppository Place 1 suppository (25 mg total) rectally every 12 (twelve) hours as needed for nausea.  12 suppository  3  . valACYclovir (VALTREX) 500 MG tablet Take 500 mg by mouth 2 (two) times daily.        Allergies:  Allergies  Allergen Reactions  . Compazine Other (See Comments)    Numbness of face and lips  .  Penicillins Nausea Only    Physical Exam: Filed Vitals:   10/18/11 1010  BP: 111/72  Pulse: 83  Temp: 97.6 F (36.4 C)  Weight: 225 lbs. HEENT: Sclerae anicteric, conjunctivae pink. Oropharynx clear. No mucositis or candidiasis.  Nodes: No cervical, supraclavicular, or axillary lymphadenopathy palpated.  Breast Exam: The left breast was examined with the patient and the lateral position, high within the tail of the breast there is a palpable thickening which is also slightly tender, but no dominant mass effect per se. Lungs: Clear to auscultation bilaterally. No crackles, rhonchi, or wheezes.  Heart: Regular rate and rhythm.  Abdomen: Soft, nontender. Positive bowel sounds. No organomegaly or masses palpated.  Musculoskeletal: No focal spinal tenderness to palpation.  Extremities: Benign. No peripheral edema or cyanosis.  Skin: The region on her left upper thigh anteriorly is continuing to fill in nicely. No evidence of erythema. Neuro: Nonfocal, alert and oriented x 3.   Lab Results: Lab Results  Component Value Date   WBC 16.1* 10/18/2011   HGB 9.6* 10/18/2011   HCT 28.9* 10/18/2011   MCV 88.4 10/18/2011   PLT 196 10/18/2011   NEUTROABS 14.5* 10/18/2011     Chemistry      Component Value Date/Time   NA 137 10/04/2011 1058   K 4.0 10/04/2011 1058   CL 102 10/04/2011 1058   CO2 28 10/04/2011 1058   BUN 18 10/04/2011 1058   CREATININE 0.73 10/04/2011 1058      Component Value Date/Time   CALCIUM 9.7 10/04/2011 1058   ALKPHOS 66 10/04/2011 1058   AST 16 10/04/2011 1058   ALT 18 10/04/2011 1058   BILITOT 0.6 10/04/2011 1058     Radiographic data: 09/05/11 BILATERAL BREAST MRI WITH AND WITHOUT CONTRAST  Technique: Multiplanar, multisequence MR images of both breasts were obtained prior to and following the intravenous administration of 20ml of Multihance. Three dimensional images were evaluated at the independent DynaCad workstation.  Comparison: MRI 06/08/2011, mammogram and ultrasound  06/01/2011 and earlierfrom the [facility] Imaging Findings: Previously, large area of enhancement was identified in the lateral portion of the left breast which measured 2.9 x 5.2 x 2.5 cm. On the current study, no significant residual enhancement remains. A clip is identified in the lateral portion of the left breast, placed at the time of ultrasound guided core biopsy. Level I axillary lymph nodes are now significantly smaller on the left. A few, small lymph nodes lack fatty hilum on the left. These appear asymmetric compared to the right axillary lymph nodes but are quite small, measuring on the order of 9 mm in diameter. Background parenchymal enhancement is minimal. Images of the right breast are unremarkable. On the right, no suspicious lymph nodes are identified. The patient has a right-sided Port-A-Cath.  IMPRESSION:  1. No significant residual enhancement in the area of known  malignancy in the lateral portion of the left breast.  2. Left axillary lymph nodes are now significantly smaller. Some  of the level I axillary lymph nodes continue to have morphologic  abnormality, suspicious for malignancy.  Original Report Authenticated By: Patterson Hammersmith, M.D.   Assessment:  Ms. Landen is a 51 year old [place] woman with a locally advanced clinical T3 N1 left breast carcinoma ER/PR positive at 8/9% respectively, HER-2 negative with a Ki-67 of 100%. Completion 4/4 planned neoadjuvant dose dense FEC, after delay of cycle 4 due to herpes zoster of the left buttock and left upper thigh region. Neulasta support planned on and in in day 2. Excellent radiographic response per MRI obtained on 09/05/2011. Due for her third cycle of neoadjuvant dose-dense Taxotere today.  2. History of severe afebrile neutropenia despite Neulasta on day 2.   3. Herpes zoster of the left buttock and left upper thigh region on Valtrex 500 mg by mouth 3 times a day, the patient  discontinued.  4. Symptoms suggesting postherpetic neuralgia, gabapentin 100 mg by mouth 3 times a day, improvement.  Case has been reviewed with Dr. Pierce Crane.  Plan:  Joscelin will receive day 1 cycle 3/4 (n)DDTaxotere today as scheduled, to return tomorrow for Neulasta. She will take her Decadron post-Taxotere therapy per protocol. I will see her in one week's time for nadir assessment, then in 2 weeks time prior to day 1 cycle 4 of neoadjuvant dose dense Taxotere.  Kajah knows to contact us though prior if the need should arise.  Patient understands and agrees with this plan. She knows to call with any changes or problems.    Nira Visscher T, PA-C 10/18/11

## 2011-10-19 ENCOUNTER — Ambulatory Visit (HOSPITAL_BASED_OUTPATIENT_CLINIC_OR_DEPARTMENT_OTHER)

## 2011-10-19 VITALS — BP 106/67 | HR 69 | Temp 97.3°F

## 2011-10-19 DIAGNOSIS — Z5189 Encounter for other specified aftercare: Secondary | ICD-10-CM

## 2011-10-19 DIAGNOSIS — C50419 Malignant neoplasm of upper-outer quadrant of unspecified female breast: Secondary | ICD-10-CM

## 2011-10-19 MED ORDER — PEGFILGRASTIM INJECTION 6 MG/0.6ML
6.0000 mg | Freq: Once | SUBCUTANEOUS | Status: AC
  Administered 2011-10-19: 6 mg via SUBCUTANEOUS
  Filled 2011-10-19: qty 0.6

## 2011-10-25 ENCOUNTER — Other Ambulatory Visit (HOSPITAL_BASED_OUTPATIENT_CLINIC_OR_DEPARTMENT_OTHER): Admitting: Lab

## 2011-10-25 ENCOUNTER — Ambulatory Visit (HOSPITAL_BASED_OUTPATIENT_CLINIC_OR_DEPARTMENT_OTHER): Admitting: Physician Assistant

## 2011-10-25 VITALS — BP 125/83 | HR 93 | Temp 98.1°F | Ht 63.0 in | Wt 221.5 lb

## 2011-10-25 DIAGNOSIS — C50419 Malignant neoplasm of upper-outer quadrant of unspecified female breast: Secondary | ICD-10-CM

## 2011-10-25 DIAGNOSIS — B029 Zoster without complications: Secondary | ICD-10-CM

## 2011-10-25 DIAGNOSIS — C50919 Malignant neoplasm of unspecified site of unspecified female breast: Secondary | ICD-10-CM

## 2011-10-25 DIAGNOSIS — Z170 Estrogen receptor positive status [ER+]: Secondary | ICD-10-CM

## 2011-10-25 DIAGNOSIS — D709 Neutropenia, unspecified: Secondary | ICD-10-CM

## 2011-10-25 LAB — CBC WITH DIFFERENTIAL/PLATELET
BASO%: 0.3 % (ref 0.0–2.0)
Basophils Absolute: 0 10*3/uL (ref 0.0–0.1)
EOS%: 0.1 % (ref 0.0–7.0)
Eosinophils Absolute: 0 10*3/uL (ref 0.0–0.5)
HCT: 27.5 % — ABNORMAL LOW (ref 34.8–46.6)
HGB: 9.1 g/dL — ABNORMAL LOW (ref 11.6–15.9)
LYMPH%: 14.7 % (ref 14.0–49.7)
MCH: 30.1 pg (ref 25.1–34.0)
MCHC: 32.9 g/dL (ref 31.5–36.0)
MCV: 91.5 fL (ref 79.5–101.0)
MONO#: 1.3 10*3/uL — ABNORMAL HIGH (ref 0.1–0.9)
MONO%: 22.5 % — ABNORMAL HIGH (ref 0.0–14.0)
NEUT#: 3.6 10*3/uL (ref 1.5–6.5)
NEUT%: 62.4 % (ref 38.4–76.8)
Platelets: 118 10*3/uL — ABNORMAL LOW (ref 145–400)
RBC: 3.01 10*6/uL — ABNORMAL LOW (ref 3.70–5.45)
RDW: 17.9 % — ABNORMAL HIGH (ref 11.2–14.5)
WBC: 5.8 10*3/uL (ref 3.9–10.3)
lymph#: 0.9 10*3/uL (ref 0.9–3.3)
nRBC: 0 % (ref 0–0)

## 2011-10-25 NOTE — Progress Notes (Signed)
Hematology and Oncology Follow Up Visit  [name] [id number] Nov 03, 1960 51 y.o. 10/25/11   HPI: 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 Completion 4/4 planned neoadjuvant dose dense FEC, after delay of cycle 4 due to herpes zoster of the left buttock and left upper thigh region. Neulasta support planned on and in in day 2. Excellent radiographic response per MRI obtained on 09/05/2011.  Currently day 7 cycle 3/4 neoadjuvant dose-dense Taxotere today.  2. History of severe afebrile neutropenia despite Neulasta on day 2.   3. Herpes zoster of the left buttock and left upper thigh region on Valtrex 500 mg by mouth 3 times a day, the patient discontinued.  4. Symptoms suggesting postherpetic neuralgia, gabapentin 100 mg by mouth 3 times a day, improvement.   Interim History:   [name] is seen today with her mother in accompaniment for followup prior after her third cycle of 4 planned neoadjuvant dose dense Taxotere with Neulasta support on day 2. She has taken dexamethasone per Taxotere protocol. Today she is feeling fatigued. She denies any fevers, chills, or night sweats. No nausea, emesis, diarrhea, or constipation issues. She denies any shortness of breath or chest pain. She is experiencing neuropathy symptoms. She does have some nail bed darkening and more discomfort of her hands, redness on the lateral aspect. She notes a "knot" on her right labia which is tender.  Review of Systems: Constitutional:  Improved. Eyes: No complaints ENT: No complaints Cardiovascular: no chest pain or dyspnea on exertion Respiratory: no cough, shortness of breath, or wheezing Neurological: no TIA or stroke symptoms Dermatological: negative Gastrointestinal: no abdominal pain, change in bowel habits, or black or bloody stools Genito-Urinary: no  dysuria, trouble voiding, or hematuria Hematological and Lymphatic: negative Breast: known L breast cancer Musculoskeletal: Pain on the left upper thigh region associated with known herpes zoster, improved with gabapentin. Remaining ROS negative.   Medications:   I have reviewed the patient's current medications.  Current Outpatient Prescriptions  Medication Sig Dispense Refill  . dexamethasone (DECADRON) 4 MG tablet Take 2 tabs two times a day the day before Taxotere chemo. Then take 2 tabs daily starting the day after chemo for 2 days.  30 tablet  1  . gabapentin (NEURONTIN) 100 MG capsule Take 3 capsules (300 mg total) by mouth 3 (three) times daily.  90 capsule  2  . HYDROcodone-acetaminophen (VICODIN) 5-500 MG per tablet Take 1 tablet by mouth every 6 (six) hours as needed. 1 po q6-8h prn pain  #90 w/1 RF, 08/27/11-CTS      . lidocaine-prilocaine (EMLA) cream Apply topically as directed. ONE TO TWO HOURS BEFORE TREATMENT.  60 g  1  . loratadine (CLARITIN) 10 MG tablet Take 10 mg by mouth daily.      Marland Kitchen LORazepam (ATIVAN) 0.5 MG tablet Take 1 tablet (0.5 mg total) by mouth every 6 (six) hours as needed (Nausea or vomiting).  30 tablet  0  . ondansetron (ZOFRAN) 8 MG tablet Take 1 tablet two times a day for 2 days starting the day after chemo, then take two times a day as needed for nausea or vomiting.  30 tablet  1  . prochlorperazine (COMPAZINE) 10 MG tablet Take 1 tablet (10 mg total) by mouth every 6 (six) hours as needed (Nausea or vomiting).  30 tablet  1  . prochlorperazine (COMPAZINE)  25 MG suppository Place 1 suppository (25 mg total) rectally every 12 (twelve) hours as needed for nausea.  12 suppository  3  . valACYclovir (VALTREX) 500 MG tablet Take 500 mg by mouth 2 (two) times daily.        Allergies:  Allergies  Allergen Reactions  . Compazine Other (See Comments)    Numbness of face and lips  . Penicillins Nausea Only    Physical Exam: Filed Vitals:   10/25/11 1208    BP: 125/83  Pulse: 93  Temp: 98.1 F (36.7 C)  Weight: 221 lbs. HEENT: Sclerae anicteric, conjunctivae pink. Oropharynx clear. No mucositis or candidiasis.  Nodes: No cervical, supraclavicular, or axillary lymphadenopathy palpated.  Breast Exam: The left breast was examined with the patient and the lateral position, high within the tail of the breast there is a palpable thickening which is also slightly tender, but no dominant mass effect per se. Lungs: Clear to auscultation bilaterally. No crackles, rhonchi, or wheezes.  Heart: Regular rate and rhythm.  Abdomen: Soft, nontender. Positive bowel sounds. No organomegaly or masses palpated. The right labial region was examined.  No evidence of erythema, but a small palpable knot is appreciated. Musculoskeletal: No focal spinal tenderness to palpation.  Extremities: Benign. No peripheral edema or cyanosis.  Skin:  Evidence of mild PPE changes on the lateral aspect of her hands. Neuro: Nonfocal, alert and oriented x 3.   Lab Results: Lab Results  Component Value Date   WBC 5.8 10/25/2011   HGB 9.1* 10/25/2011   HCT 27.5* 10/25/2011   MCV 91.5 10/25/2011   PLT 118* 10/25/2011   NEUTROABS 3.6 10/25/2011     Chemistry      Component Value Date/Time   NA 137 10/18/2011 0949   K 3.3* 10/18/2011 0949   CL 102 10/18/2011 0949   CO2 28 10/18/2011 0949   BUN 14 10/18/2011 0949   CREATININE 0.59 10/18/2011 0949      Component Value Date/Time   CALCIUM 8.8 10/18/2011 0949   ALKPHOS 64 10/18/2011 0949   AST 15 10/18/2011 0949   ALT 10 10/18/2011 0949   BILITOT 0.6 10/18/2011 0949     Radiographic data: 09/05/11 BILATERAL BREAST MRI WITH AND WITHOUT CONTRAST  Technique: Multiplanar, multisequence MR images of both breasts were obtained prior to and following the intravenous administration of 20ml of Multihance. Three dimensional images were evaluated at the independent DynaCad workstation.  Comparison: MRI 06/08/2011, mammogram and ultrasound  06/01/2011 and [facility] Imaging Findings: Previously, large area of enhancement was identified in the lateral portion of the left breast which measured 2.9 x 5.2 x 2.5 cm. On the current study, no significant residual enhancement remains. A clip is identified in the lateral portion of the left breast, placed at the time of ultrasound guided core biopsy. Level I axillary lymph nodes are now significantly smaller on the left. A few, small lymph nodes lack fatty hilum on the left. These appear asymmetric compared to the right axillary lymph nodes but are quite small, measuring on the order of 9 mm in diameter. Background parenchymal enhancement is minimal. Images of the right breast are unremarkable. On the right, no suspicious lymph nodes are identified. The patient has a right-sided Port-A-Cath.  IMPRESSION:  1. No significant residual enhancement in the area of known  malignancy in the lateral portion of the left breast.  2. Left axillary lymph nodes are now significantly smaller. Some  of the level I axillary lymph  nodes continue to have morphologic  abnormality, suspicious for malignancy.  Original Report Authenticated By: Patterson Hammersmith, M.D.   Assessment:  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 Completion 4/4 planned neoadjuvant dose dense FEC, after delay of cycle 4 due to herpes zoster of the left buttock and left upper thigh region. Neulasta support planned on and in in day 2. Excellent radiographic response per MRI obtained on 09/05/2011. Currently day 7, cycle 3/4 of neoadjuvant dose-dense Taxotere today.  2. History of severe afebrile neutropenia despite Neulasta on day 2.   3. Herpes zoster of the left buttock and left upper thigh region on Valtrex 500 mg by mouth 3 times a day, the patient  discontinued.  4. Symptoms suggesting postherpetic neuralgia, gabapentin 100 mg by mouth 3 times a day, improvement.  5. Evidence of mild PPE of her hands.  6. Questionable absess in right labial region.  Case reviewed with Dr. Pierce Crane.  Plan:  Anglia will use a shea butter based product on her hands and feet.  She will continue on gabapentin 100mg  three times a day as previously prescribed.  She will try some soaks on the region in the labia, thought I have provided a prescription for Keflex if the region becomes more painful.  She will be seen by Dr. Donnie Coffin in 1 week's time in anticipation of day 1 cycle 4 neoadjuvant dose dense Taxotere, but she understands based on her PPE findings, this may be eliminated. Patient understands and agrees with this plan. She knows to call with any changes or problems.    Amulya Quintin T, PA-C 10/25/11

## 2011-11-01 ENCOUNTER — Ambulatory Visit (HOSPITAL_BASED_OUTPATIENT_CLINIC_OR_DEPARTMENT_OTHER): Admitting: Oncology

## 2011-11-01 ENCOUNTER — Other Ambulatory Visit (HOSPITAL_BASED_OUTPATIENT_CLINIC_OR_DEPARTMENT_OTHER): Admitting: Lab

## 2011-11-01 ENCOUNTER — Ambulatory Visit (HOSPITAL_BASED_OUTPATIENT_CLINIC_OR_DEPARTMENT_OTHER)

## 2011-11-01 VITALS — BP 131/83 | HR 97 | Temp 98.2°F | Ht 63.0 in | Wt 223.2 lb

## 2011-11-01 DIAGNOSIS — Z170 Estrogen receptor positive status [ER+]: Secondary | ICD-10-CM

## 2011-11-01 DIAGNOSIS — Z5111 Encounter for antineoplastic chemotherapy: Secondary | ICD-10-CM

## 2011-11-01 DIAGNOSIS — C50419 Malignant neoplasm of upper-outer quadrant of unspecified female breast: Secondary | ICD-10-CM

## 2011-11-01 DIAGNOSIS — G620 Drug-induced polyneuropathy: Secondary | ICD-10-CM

## 2011-11-01 DIAGNOSIS — H04209 Unspecified epiphora, unspecified lacrimal gland: Secondary | ICD-10-CM

## 2011-11-01 LAB — CBC WITH DIFFERENTIAL/PLATELET
BASO%: 0.1 % (ref 0.0–2.0)
Basophils Absolute: 0 10*3/uL (ref 0.0–0.1)
EOS%: 0 % (ref 0.0–7.0)
Eosinophils Absolute: 0 10*3/uL (ref 0.0–0.5)
HCT: 30.2 % — ABNORMAL LOW (ref 34.8–46.6)
HGB: 9.9 g/dL — ABNORMAL LOW (ref 11.6–15.9)
LYMPH%: 5.7 % — ABNORMAL LOW (ref 14.0–49.7)
MCH: 28.9 pg (ref 25.1–34.0)
MCHC: 32.8 g/dL (ref 31.5–36.0)
MCV: 88 fL (ref 79.5–101.0)
MONO#: 0.1 10*3/uL (ref 0.1–0.9)
MONO%: 0.6 % (ref 0.0–14.0)
NEUT#: 13.4 10*3/uL — ABNORMAL HIGH (ref 1.5–6.5)
NEUT%: 93.6 % — ABNORMAL HIGH (ref 38.4–76.8)
Platelets: 193 10*3/uL (ref 145–400)
RBC: 3.43 10*6/uL — ABNORMAL LOW (ref 3.70–5.45)
RDW: 16.8 % — ABNORMAL HIGH (ref 11.2–14.5)
WBC: 14.3 10*3/uL — ABNORMAL HIGH (ref 3.9–10.3)
lymph#: 0.8 10*3/uL — ABNORMAL LOW (ref 0.9–3.3)

## 2011-11-01 LAB — COMPREHENSIVE METABOLIC PANEL
ALT: 13 U/L (ref 0–35)
AST: 20 U/L (ref 0–37)
Albumin: 3.7 g/dL (ref 3.5–5.2)
Alkaline Phosphatase: 73 U/L (ref 39–117)
BUN: 15 mg/dL (ref 6–23)
CO2: 27 mEq/L (ref 19–32)
Calcium: 9.7 mg/dL (ref 8.4–10.5)
Chloride: 99 mEq/L (ref 96–112)
Creatinine, Ser: 0.81 mg/dL (ref 0.50–1.10)
Glucose, Bld: 125 mg/dL — ABNORMAL HIGH (ref 70–99)
Potassium: 3.8 mEq/L (ref 3.5–5.3)
Sodium: 137 mEq/L (ref 135–145)
Total Bilirubin: 0.7 mg/dL (ref 0.3–1.2)
Total Protein: 6.3 g/dL (ref 6.0–8.3)

## 2011-11-01 MED ORDER — DEXAMETHASONE SODIUM PHOSPHATE 10 MG/ML IJ SOLN
10.0000 mg | Freq: Once | INTRAMUSCULAR | Status: AC
  Administered 2011-11-01: 10 mg via INTRAVENOUS

## 2011-11-01 MED ORDER — DOCETAXEL CHEMO INJECTION 160 MG/16ML
50.0000 mg/m2 | Freq: Once | INTRAVENOUS | Status: AC
  Administered 2011-11-01: 110 mg via INTRAVENOUS
  Filled 2011-11-01: qty 11

## 2011-11-01 MED ORDER — SODIUM CHLORIDE 0.9 % IV SOLN
Freq: Once | INTRAVENOUS | Status: AC
  Administered 2011-11-01: 16:00:00 via INTRAVENOUS

## 2011-11-01 MED ORDER — ONDANSETRON 8 MG/50ML IVPB (CHCC)
8.0000 mg | Freq: Once | INTRAVENOUS | Status: AC
  Administered 2011-11-01: 8 mg via INTRAVENOUS

## 2011-11-01 MED ORDER — TOBRAMYCIN-DEXAMETHASONE 0.3-0.1 % OP SUSP: 1.0000 [drp] | OPHTHALMIC | Status: AC

## 2011-11-01 NOTE — Progress Notes (Signed)
Hematology and Oncology Follow Up Visit  Kelsey Wilson [id number] [birth year] 51 y.o. 10/25/11   HPI: Kelsey Wilson is a 51 year old [place] woman with a locally advanced clinical T3 N1 left breast carcinoma ER/PR positive at 8/9% respectively, HER-2 negative with a Ki-67 of 100%. Completion 4/4 planned neoadjuvant dose dense FEC, after delay of cycle 4 due to herpes zoster of the left buttock and left upper thigh region. Neulasta support planned on and in in day 2. Excellent radiographic response per MRI obtained on 09/05/2011.  Currently day 1 cycle 4/4 neoadjuvant dose-dense Taxotere today.  2. History of severe afebrile neutropenia despite Neulasta on day 2.   3. Herpes zoster of the left buttock and left upper thigh region on Valtrex 500 mg by mouth 3 times a day, the patient discontinued.  4. Symptoms suggesting postherpetic neuralgia, gabapentin 100 mg by mouth 3 times a day, improvement.   Interim History:   Kelsey Wilson is seen today with her mother in accompaniment for followup prior after her third cycle of 4 planned neoadjuvant dose dense Taxotere with Neulasta support on day 2. She has taken dexamethasone per Taxotere protocol. Today she is feeling fatigued. She denies any fevers, chills, or night sweats. No nausea, emesis, diarrhea, or constipation issues. She denies any shortness of breath or chest pain. She is experiencing neuropathy symptoms. She does have some nail bed darkening and more discomfort of her hands, redness on the lateral aspect. She is having tearing despite the drops.she continues to have tenderness in her digits.  Review of Systems: Constitutional:  Improved. Eyes: No complaints ENT: No complaints Cardiovascular: no chest pain or dyspnea on exertion Respiratory: no cough, shortness of breath, or wheezing Neurological: no TIA or stroke symptoms Dermatological: negative Gastrointestinal: no abdominal pain, change in bowel habits, or black or  bloody stools Genito-Urinary: no dysuria, trouble voiding, or hematuria Hematological and Lymphatic: negative Breast: known L breast cancer Musculoskeletal: Pain on the left upper thigh region associated with known herpes zoster, improved with gabapentin. Remaining ROS negative.   Medications:   I have reviewed the patient's current medications.  Current Outpatient Prescriptions  Medication Sig Dispense Refill  . b complex vitamins tablet Take 1 tablet by mouth daily.      Marland Kitchen dexamethasone (DECADRON) 4 MG tablet Take 2 tabs two times a day the day before Taxotere chemo. Then take 2 tabs daily starting the day after chemo for 2 days.  30 tablet  1  . gabapentin (NEURONTIN) 100 MG capsule Take 3 capsules (300 mg total) by mouth 3 (three) times daily.  90 capsule  2  . lidocaine-prilocaine (EMLA) cream Apply topically as directed. ONE TO TWO HOURS BEFORE TREATMENT.  60 g  1  . loratadine (CLARITIN) 10 MG tablet Take 10 mg by mouth daily.      Marland Kitchen LORazepam (ATIVAN) 0.5 MG tablet Take 1 tablet (0.5 mg total) by mouth every 6 (six) hours as needed (Nausea or vomiting).  30 tablet  0  . ondansetron (ZOFRAN) 8 MG tablet Take 1 tablet two times a day for 2 days starting the day after chemo, then take two times a day as needed for nausea or vomiting.  30 tablet  1  . prochlorperazine (COMPAZINE) 10 MG tablet Take 1 tablet (10 mg total) by mouth every 6 (six) hours as needed (Nausea or vomiting).  30 tablet  1  . prochlorperazine (COMPAZINE) 25 MG suppository Place 1 suppository (25 mg total) rectally every 12 (twelve) hours  as needed for nausea.  12 suppository  3  . traMADol (ULTRAM) 50 MG tablet Take 50 mg by mouth every 6 (six) hours as needed. One to two every 6-8 hrs prn for pain        Allergies:  Allergies  Allergen Reactions  . Compazine Other (See Comments)    Numbness of face and lips  . Penicillins Nausea Only    Physical Exam: Filed Vitals:   11/01/11 1518  BP: 131/83  Pulse: 97    Temp: 98.2 F (36.8 C)  Weight: 221 lbs. HEENT: Sclerae anicteric, conjunctivae pink. Oropharynx clear. No mucositis or candidiasis.  Nodes: No cervical, supraclavicular, or axillary lymphadenopathy palpated.  Breast Exam: The left breast was examined with the patient and the lateral position, high within the tail of the breast there is a palpable thickening which is also slightly tender, but no dominant mass effect per se.I cannot feel a mass. Lungs: Clear to auscultation bilaterally. No crackles, rhonchi, or wheezes.  Heart: Regular rate and rhythm.  Abdomen: Soft, nontender. Positive bowel sounds. No organomegaly or masses palpated. The right labial region was examined.  No evidence of erythema, but a small palpable knot is appreciated. Musculoskeletal: No focal spinal tenderness to palpation.  Extremities: Benign. No peripheral edema or cyanosis.  Skin:  Evidence of mild PPE changes on the lateral aspect of her hands. Neuro: Nonfocal, alert and oriented x 3.   Lab Results: Lab Results  Component Value Date   WBC 14.3* 11/01/2011   HGB 9.9* 11/01/2011   HCT 30.2* 11/01/2011   MCV 88.0 11/01/2011   PLT 193 11/01/2011   NEUTROABS 13.4* 11/01/2011     Chemistry      Component Value Date/Time   NA 137 10/18/2011 0949   K 3.3* 10/18/2011 0949   CL 102 10/18/2011 0949   CO2 28 10/18/2011 0949   BUN 14 10/18/2011 0949   CREATININE 0.59 10/18/2011 0949      Component Value Date/Time   CALCIUM 8.8 10/18/2011 0949   ALKPHOS 64 10/18/2011 0949   AST 15 10/18/2011 0949   ALT 10 10/18/2011 0949   BILITOT 0.6 10/18/2011 0949     Radiographic data: 09/05/11 BILATERAL BREAST MRI WITH AND WITHOUT CONTRAST  Technique: Multiplanar, multisequence MR images of both breasts were obtained prior to and following the intravenous administration of 20ml of Multihance. Three dimensional images were evaluated at the independent DynaCad workstation.  Comparison: MRI 06/08/2011, mammogram and ultrasound 06/01/2011  and earlierfrom the [facility] Imaging Findings: Previously, large area of enhancement was identified in the lateral portion of the left breast which measured 2.9 x 5.2 x 2.5 cm. On the current study, no significant residual enhancement remains. A clip is identified in the lateral portion of the left breast, placed at the time of ultrasound guided core biopsy. Level I axillary lymph nodes are now significantly smaller on the left. A few, small lymph nodes lack fatty hilum on the left. These appear asymmetric compared to the right axillary lymph nodes but are quite small, measuring on the order of 9 mm in diameter. Background parenchymal enhancement is minimal. Images of the right breast are unremarkable. On the right, no suspicious lymph nodes are identified. The patient has a right-sided Port-A-Cath.  IMPRESSION:  1. No significant residual enhancement in the area of known  malignancy in the lateral portion of the left breast.  2. Left axillary lymph nodes are now significantly smaller. Some  of the level I axillary  lymph nodes continue to have morphologic  abnormality, suspicious for malignancy.  Original Report Authenticated By: Patterson Hammersmith, M.D.   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 Completion 4/4 planned neoadjuvant dose dense FEC, after delay of cycle 4 due to herpes zoster of the left buttock and left upper thigh region. Neulasta support planned on and in in day 2. Excellent radiographic response per MRI obtained on 09/05/2011. Currently day 1 , cycle 4/4 of neoadjuvant dose-dense Taxotere today.Her surgery is scheduled for the end of June.  2. History of severe afebrile neutropenia despite Neulasta on day 2.   3. Herpes zoster of the left buttock and left upper thigh region on Valtrex 500 mg by mouth 3  times a day, the patient discontinued.  4. Symptoms suggesting postherpetic neuralgia, gabapentin 100 mg by mouth 3 times a day, improvement.  5. Evidence of mild PPE of her hands.    Case reviewed with Dr. Pierce Crane.  Plan:  She is doing pretty well, she has minimal findings of PPE.we will go ahead with taxotere at a slightly reduced dose, due to tearing and her neuropathy. I have prrescribed tobradex drops. She will be seen in 1 week. Patient understands and agrees with this plan. She knows to call with any changes or problems.    Pierce Crane, MD 10/25/11

## 2011-11-02 ENCOUNTER — Ambulatory Visit (HOSPITAL_BASED_OUTPATIENT_CLINIC_OR_DEPARTMENT_OTHER)

## 2011-11-02 ENCOUNTER — Other Ambulatory Visit: Payer: Self-pay | Admitting: *Deleted

## 2011-11-02 VITALS — BP 117/76 | HR 70 | Temp 96.7°F

## 2011-11-02 DIAGNOSIS — C50419 Malignant neoplasm of upper-outer quadrant of unspecified female breast: Secondary | ICD-10-CM

## 2011-11-02 DIAGNOSIS — Z5189 Encounter for other specified aftercare: Secondary | ICD-10-CM

## 2011-11-02 MED ORDER — LORAZEPAM 0.5 MG PO TABS: 0.5000 mg | ORAL_TABLET | Freq: Four times a day (QID) | ORAL | Status: DC | PRN

## 2011-11-02 MED ORDER — PEGFILGRASTIM INJECTION 6 MG/0.6ML
6.0000 mg | Freq: Once | SUBCUTANEOUS | Status: AC
  Administered 2011-11-02: 6 mg via SUBCUTANEOUS

## 2011-11-08 ENCOUNTER — Other Ambulatory Visit: Payer: Self-pay | Admitting: Oncology

## 2011-11-08 ENCOUNTER — Other Ambulatory Visit (HOSPITAL_BASED_OUTPATIENT_CLINIC_OR_DEPARTMENT_OTHER): Admitting: Lab

## 2011-11-08 ENCOUNTER — Other Ambulatory Visit: Payer: Self-pay | Admitting: *Deleted

## 2011-11-08 DIAGNOSIS — C50419 Malignant neoplasm of upper-outer quadrant of unspecified female breast: Secondary | ICD-10-CM

## 2011-11-08 LAB — COMPREHENSIVE METABOLIC PANEL
ALT: 9 U/L (ref 0–35)
AST: 17 U/L (ref 0–37)
Albumin: 3.5 g/dL (ref 3.5–5.2)
Alkaline Phosphatase: 60 U/L (ref 39–117)
BUN: 8 mg/dL (ref 6–23)
CO2: 26 mEq/L (ref 19–32)
Calcium: 8.5 mg/dL (ref 8.4–10.5)
Chloride: 103 mEq/L (ref 96–112)
Creatinine, Ser: 0.62 mg/dL (ref 0.50–1.10)
Glucose, Bld: 98 mg/dL (ref 70–99)
Potassium: 3.5 mEq/L (ref 3.5–5.3)
Sodium: 137 mEq/L (ref 135–145)
Total Bilirubin: 1.1 mg/dL (ref 0.3–1.2)
Total Protein: 5.4 g/dL — ABNORMAL LOW (ref 6.0–8.3)

## 2011-11-08 LAB — CBC WITH DIFFERENTIAL/PLATELET
BASO%: 0.5 % (ref 0.0–2.0)
Basophils Absolute: 0 10*3/uL (ref 0.0–0.1)
EOS%: 0.1 % (ref 0.0–7.0)
Eosinophils Absolute: 0 10*3/uL (ref 0.0–0.5)
HCT: 25.9 % — ABNORMAL LOW (ref 34.8–46.6)
HGB: 8.4 g/dL — ABNORMAL LOW (ref 11.6–15.9)
LYMPH%: 15.7 % (ref 14.0–49.7)
MCH: 29.8 pg (ref 25.1–34.0)
MCHC: 32.6 g/dL (ref 31.5–36.0)
MCV: 91.6 fL (ref 79.5–101.0)
MONO#: 1.1 10*3/uL — ABNORMAL HIGH (ref 0.1–0.9)
MONO%: 21.9 % — ABNORMAL HIGH (ref 0.0–14.0)
NEUT#: 3.2 10*3/uL (ref 1.5–6.5)
NEUT%: 61.8 % (ref 38.4–76.8)
Platelets: 122 10*3/uL — ABNORMAL LOW (ref 145–400)
RBC: 2.83 10*6/uL — ABNORMAL LOW (ref 3.70–5.45)
RDW: 17.1 % — ABNORMAL HIGH (ref 11.2–14.5)
WBC: 5.2 10*3/uL (ref 3.9–10.3)
lymph#: 0.8 10*3/uL — ABNORMAL LOW (ref 0.9–3.3)
nRBC: 0 % (ref 0–0)

## 2011-11-12 ENCOUNTER — Telehealth: Payer: Self-pay | Admitting: *Deleted

## 2011-11-12 NOTE — Telephone Encounter (Signed)
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 PT HAS BEEN INSTRUCTED TO TAKE TYLENOL. PT ALSO DESCRIBES NOSE BLEEDS WHEN SHE "BLOWS" PT HAS BEEN INSTRUCTED TO MONITOR FOR BLEEDING IN THE MOUTH AND ALSO RECTALLY. PT WILL CALL THIS DESK IF PAIN WORSENS OR IF BLEEDING CONTINUES

## 2011-11-13 ENCOUNTER — Encounter (HOSPITAL_COMMUNITY): Payer: Self-pay | Admitting: Pharmacy Technician

## 2011-11-14 ENCOUNTER — Encounter (HOSPITAL_COMMUNITY): Payer: Self-pay | Admitting: *Deleted

## 2011-11-14 ENCOUNTER — Inpatient Hospital Stay (HOSPITAL_COMMUNITY): Admission: RE | Admit: 2011-11-14 | Source: Ambulatory Visit

## 2011-11-14 NOTE — Pre-Procedure Instructions (Addendum)
20 Kelsey Wilson  11/14/2011   Your procedure is scheduled on:  11/28/11  Report to [facility] at 7:30 AM.  Call this number if you have problems the morning of surgery: [phone]   Remember: Discontinue Aspirin, Coumadin, Plavix, Effient and herbal medications.   Do not eat food or drink liquids after midnight.    Take these medicines the morning of surgery with A SIP OF WATER: Neurontin/Gabapentin, Claritin/Loratadine, eye drops, Tramadol/Ultram.   Do not wear jewelry, make-up or nail polish.  Do not wear lotions, powders, or perfumes. You may wear deodorant.  Do not shave 48 hours prior to surgery. Men may shave face and neck.  Do not bring valuables to the hospital.  Contacts, dentures or bridgework may not be worn into surgery.  Leave suitcase in the car. After surgery it may be brought to your room.  For patients admitted to the hospital, checkout time is 11:00 AM the day of discharge.   Patients discharged the day of surgery will not be allowed to drive home.    Special Instructions: CHG Shower Use Special Wash: 1/2 bottle night before surgery and 1/2 bottle morning of surgery.   Please read over the following fact sheets that you were given: Pain Booklet, Coughing and Deep Breathing, MRSA Information and Surgical Site Infection Prevention

## 2011-11-23 ENCOUNTER — Other Ambulatory Visit: Payer: Self-pay | Admitting: *Deleted

## 2011-11-23 DIAGNOSIS — C50919 Malignant neoplasm of unspecified site of unspecified female breast: Secondary | ICD-10-CM

## 2011-11-23 MED ORDER — LORAZEPAM 0.5 MG PO TABS: 0.5000 mg | ORAL_TABLET | Freq: Four times a day (QID) | ORAL | Status: DC | PRN

## 2011-11-23 MED ORDER — CEPHALEXIN 500 MG PO CAPS: 500.0000 mg | ORAL_CAPSULE | Freq: Three times a day (TID) | ORAL | Status: DC

## 2011-11-23 MED ORDER — FLUCONAZOLE 100 MG PO TABS: 100.0000 mg | ORAL_TABLET | Freq: Every day | ORAL | Status: DC

## 2011-11-23 MED ORDER — GABAPENTIN 300 MG PO CAPS: 300.0000 mg | ORAL_CAPSULE | Freq: Three times a day (TID) | ORAL | Status: DC

## 2011-11-23 MED ORDER — HYDROCODONE-ACETAMINOPHEN 5-500 MG PO TABS: ORAL_TABLET | ORAL | Status: DC

## 2011-11-23 NOTE — Telephone Encounter (Signed)
Pt. Calls with problems with hands and fingernails.  Fingernails are coming off.  They are draining - some bloody, some clear and some seem to have some pus.  Her hands are still sore, stinging and numb.  She cant do anything for herself.  She cant sleep.  She rates her pain of 10/10.  She also has a cough.  She is due to have surgery next  Wednesday.  She states this started last week with her fingernails. Discussed with Debbora Presto PA.   Increase Neurontin 300mg  TID to 600 mg TID. She just refilled this so she should have plenty.  Begin Keflex 500mg  TID for 10 days, Diflucan 100mg  daily for 10 days.  She just refilled her ultram, however it is not helping that much.  Add Vicodin 5/500 one to two Q 6-8 hrs prn pain.   Discussed this with Kimyata and she verbalized understanding and repeated back to me.  Also let her know not to do any vinegar soaks for right now.   Pt. Request that pharmacy should be [facility].   Called [facility] and discussed Keflex and Neurontin  With pharmacist.

## 2011-11-26 ENCOUNTER — Telehealth: Payer: Self-pay | Admitting: *Deleted

## 2011-11-26 ENCOUNTER — Encounter (HOSPITAL_COMMUNITY): Payer: Self-pay | Admitting: *Deleted

## 2011-11-26 ENCOUNTER — Emergency Department (HOSPITAL_COMMUNITY)
Admission: EM | Admit: 2011-11-26 | Discharge: 2011-11-26 | Disposition: A | Discharge status: Home / Self Care | Attending: Emergency Medicine | Admitting: Emergency Medicine

## 2011-11-26 ENCOUNTER — Encounter (HOSPITAL_COMMUNITY): Payer: Self-pay

## 2011-11-26 ENCOUNTER — Encounter (HOSPITAL_COMMUNITY)
Admission: RE | Admit: 2011-11-26 | Discharge: 2011-11-26 | Disposition: A | Discharge status: Home / Self Care | Source: Ambulatory Visit | Attending: General Surgery | Admitting: General Surgery

## 2011-11-26 DIAGNOSIS — M7989 Other specified soft tissue disorders: Secondary | ICD-10-CM | POA: Insufficient documentation

## 2011-11-26 DIAGNOSIS — L609 Nail disorder, unspecified: Secondary | ICD-10-CM

## 2011-11-26 DIAGNOSIS — L608 Other nail disorders: Secondary | ICD-10-CM | POA: Insufficient documentation

## 2011-11-26 DIAGNOSIS — Z79899 Other long term (current) drug therapy: Secondary | ICD-10-CM | POA: Insufficient documentation

## 2011-11-26 LAB — CBC
HCT: 29.7 % — ABNORMAL LOW (ref 36.0–46.0)
Hemoglobin: 9.5 g/dL — ABNORMAL LOW (ref 12.0–15.0)
MCH: 29.4 pg (ref 26.0–34.0)
MCHC: 32 g/dL (ref 30.0–36.0)
MCV: 92 fL (ref 78.0–100.0)
Platelets: 203 10*3/uL (ref 150–400)
RBC: 3.23 MIL/uL — ABNORMAL LOW (ref 3.87–5.11)
RDW: 16.8 % — ABNORMAL HIGH (ref 11.5–15.5)
WBC: 2.4 10*3/uL — ABNORMAL LOW (ref 4.0–10.5)

## 2011-11-26 LAB — SURGICAL PCR SCREEN
MRSA, PCR: NEGATIVE
Staphylococcus aureus: NEGATIVE

## 2011-11-26 LAB — BASIC METABOLIC PANEL
BUN: 10 mg/dL (ref 6–23)
CO2: 27 mEq/L (ref 19–32)
Calcium: 9.2 mg/dL (ref 8.4–10.5)
Chloride: 104 mEq/L (ref 96–112)
Creatinine, Ser: 0.64 mg/dL (ref 0.50–1.10)
GFR calc Af Amer: >90 mL/min (ref >90)
GFR calc non Af Amer: >90 mL/min (ref >90)
Glucose, Bld: 99 mg/dL (ref 70–99)
Potassium: 4 mEq/L (ref 3.5–5.1)
Sodium: 140 mEq/L (ref 135–145)

## 2011-11-26 MED ORDER — OXYCODONE-ACETAMINOPHEN 5-325 MG PO TABS: 1.0000 | ORAL_TABLET | Freq: Four times a day (QID) | ORAL | Status: AC | PRN

## 2011-11-26 NOTE — Telephone Encounter (Signed)
Pt. Calls as her problems with her hands have worsened since Friday.  Friday she was put on keflex, diflucan and vicodin and her neurontin doubled per Debbora Presto PA.  Her fingers on her left hand are more swollen and painful and one of the fingernails on her right hand has turned green.  Her fingernails are still draining some.  She denies fever.  Discussed with Debbora Presto PA and patient is directed to ED (9:49am).  She is due to have lab work at [facility] at 10:40 am today for her surgery this Wednesday, however she will just go to [facility] ED and they can draw the same labs.  Pt is agreeable with this plan.

## 2011-11-26 NOTE — ED Provider Notes (Signed)
History     CSN: [id number]  Arrival date & time 11/26/11  1148   First MD Initiated Contact with Patient 11/26/11 1327      Chief Complaint  Patient presents with  . Finger Swelling   . Drainage From Nail Beds      HPI The patient reports nailbed drainage and finger swelling ongoing for the last month.  Patient states she also has aching in her hands and numbness in her fingertips. Patient had chemotherapy the last treatment was last month and she was told she was to have difficulty with her fingernails associated with this. Patient was seen by the oncology doctors on Friday and she was started on Keflex Diflucan and given Vicodin for pain.  The patient called her Dr. today because she was having persistent and increasing discomfort. She was told to come to the emergency room for evaluation.  Patient states all of her nailbeds have turned black. Her nails seem to be lifting off the matrix. She has noticed the discoloration her toenails as well but it is not as severe. Past Medical History  Diagnosis Date  . Night sweats   . Fatigue   . Wears glasses   . SOB (shortness of breath) on exertion     walking and stairts  . Arthritis   . Hot flashes   . Blood transfusion   . Diverticulitis of colon   . Headache   . Anemia   . Cancer     left breast cancer with chemo dx 1/13   fingers driaining  poss from chemo. an 2 antibiotics    Past Surgical History  Procedure Date  . Cholecystectomy   . Appendectomy   . Carpal tunnel release     Bilateral  . Tonsillectomy   . Spur     Apex spur on both big toes  . Knee surgery     Left Knee  . Portacath placement 06/18/2011    Procedure: INSERTION PORT-A-CATH;  Surgeon: Mariella Saa, MD;  Location: [facility];  Service: General;  Laterality: Right;  right subclavian  . Abdominal hysterectomy     still has ovaries    No family history on file.  History  Substance Use Topics  . Smoking status: Never Smoker   .  Smokeless tobacco: Never Used  . Alcohol Use: No    OB History    Grav Para Term Preterm Abortions TAB SAB Ect Mult Living                  Review of Systems  All other systems reviewed and are negative.    Allergies  Compazine; Aleve; and Penicillins  Home Medications   Current Outpatient Rx  Name Route Sig Dispense Refill  . ACETAMINOPHEN 500 MG PO TABS Oral Take 500 mg by mouth every 6 (six) hours as needed. For pain.    . CEPHALEXIN 500 MG PO CAPS Oral Take 500 mg by mouth 3 (three) times daily.    Marland Kitchen FLUCONAZOLE 100 MG PO TABS Oral Take 100 mg by mouth daily. For 10 days    . GABAPENTIN 300 MG PO CAPS Oral Take 300-600 mg by mouth 3 (three) times daily.    Marland Kitchen HYDROCODONE-ACETAMINOPHEN 5-500 MG PO TABS Oral Take 1-2 tablets by mouth every 6 (six) hours as needed. For pain    . LIDOCAINE-PRILOCAINE 2.5-2.5 % EX CREA Topical Apply 1 application topically daily as needed. Use 1 to 2 hours prior to treatment.    Marland Kitchen  LORATADINE 10 MG PO TABS Oral Take 10 mg by mouth daily as needed. "For adverse reaction to shots"    . LORAZEPAM 0.5 MG PO TABS Oral Take 1 tablet (0.5 mg total) by mouth every 6 (six) hours as needed. For anxiety. 30 tablet 0    PHONED TO PHAMACY  . ONDANSETRON HCL 8 MG PO TABS Oral Take 8 mg by mouth every 12 (twelve) hours as needed. For nausea and vomiting.    . TOBRAMYCIN-DEXAMETHASONE 0.3-0.1 % OP SUSP Both Eyes Place 1 drop into both eyes every 4 (four) hours while awake.    Marland Kitchen TRAMADOL HCL 50 MG PO TABS Oral Take 50-100 mg by mouth every 6 (six) hours as needed. For pain      BP 130/85  Pulse 87  Temp 98.2 F (36.8 C) (Oral)  Resp 16  SpO2 98%  Physical Exam  Nursing note and vitals reviewed. Constitutional: She appears well-developed and well-nourished. No distress.  HENT:  Head: Normocephalic and atraumatic.  Right Ear: External ear normal.  Left Ear: External ear normal.  Eyes: Conjunctivae are normal. Right eye exhibits no discharge. Left eye  exhibits no discharge. No scleral icterus.  Neck: Neck supple. No tracheal deviation present.  Cardiovascular: Normal rate, regular rhythm and intact distal pulses.   Pulmonary/Chest: Effort normal and breath sounds normal. No stridor. No respiratory distress. She has no wheezes. She has no rales.  Abdominal: Soft. Bowel sounds are normal. She exhibits no distension. There is no tenderness. There is no rebound and no guarding.  Musculoskeletal: She exhibits no edema and no tenderness.       All the nailbeds are darkly discolored, very loosely adherent to the nail matrix, there is no purulent drainage noted, no erythema, no signs of paronychia or felon  Neurological: She is alert. She has normal strength. No sensory deficit. Cranial nerve deficit:  no gross defecits noted. She exhibits normal muscle tone. She displays no seizure activity. Coordination normal.  Skin: Skin is warm and dry. No rash noted.  Psychiatric: She has a normal mood and affect.    ED Course  Procedures (including critical care time)  Labs Reviewed - No data to display No results found.   No diagnosis found.    MDM  I suspect the patient's symptoms are related to a reaction from her chemotherapy. There doesn't appear to be evidence of an acute bacterial infection based on my exam. She is taking Keflex Diflucan at this time. However continue his medications. Patient did have labs drawn as an outpatient today and I did review them .  Her white blood cell count is decreasing otherwise it does not appear to be any acute changes.  At this time there does not appear to be any evidence of an acute emergency medical condition and the patient appears stable for discharge with appropriate outpatient follow up.         Celene Kras, MD 11/26/11 1351

## 2011-11-26 NOTE — Progress Notes (Signed)
Pt on two antibiotics for draining fingers.poss. Rel to chemo

## 2011-11-26 NOTE — ED Notes (Signed)
Pt reports nailbed drainage and distal finger swelling x1 month. C/o aching in hands up arms. Concerned it is related to chemo which she completed end of May. Feels like her nails "are trying to come off."

## 2011-11-27 MED ORDER — CIPROFLOXACIN IN D5W 400 MG/200ML IV SOLN
400.0000 mg | INTRAVENOUS | Status: AC
  Administered 2011-11-28: 400 mg via INTRAVENOUS
  Filled 2011-11-27: qty 200

## 2011-11-28 ENCOUNTER — Ambulatory Visit
Admission: RE | Admit: 2011-11-28 | Discharge: 2011-11-28 | Disposition: A | Discharge status: Home / Self Care | Source: Ambulatory Visit | Attending: General Surgery | Admitting: General Surgery

## 2011-11-28 ENCOUNTER — Encounter (HOSPITAL_COMMUNITY): Payer: Self-pay | Admitting: Anesthesiology

## 2011-11-28 ENCOUNTER — Ambulatory Visit (HOSPITAL_COMMUNITY): Admitting: Anesthesiology

## 2011-11-28 ENCOUNTER — Encounter (HOSPITAL_COMMUNITY): Payer: Self-pay

## 2011-11-28 ENCOUNTER — Observation Stay (HOSPITAL_COMMUNITY)
Admission: RE | Admit: 2011-11-28 | Discharge: 2011-11-29 | Disposition: A | Discharge status: Home / Self Care | Source: Ambulatory Visit | Attending: General Surgery | Admitting: General Surgery

## 2011-11-28 ENCOUNTER — Encounter (HOSPITAL_COMMUNITY)
Admission: RE | Disposition: A | Discharge status: Home / Self Care | Payer: Self-pay | Source: Ambulatory Visit | Attending: General Surgery

## 2011-11-28 ENCOUNTER — Other Ambulatory Visit (INDEPENDENT_AMBULATORY_CARE_PROVIDER_SITE_OTHER): Payer: Self-pay | Admitting: General Surgery

## 2011-11-28 DIAGNOSIS — C50919 Malignant neoplasm of unspecified site of unspecified female breast: Principal | ICD-10-CM | POA: Insufficient documentation

## 2011-11-28 DIAGNOSIS — C50912 Malignant neoplasm of unspecified site of left female breast: Secondary | ICD-10-CM

## 2011-11-28 DIAGNOSIS — R51 Headache: Secondary | ICD-10-CM | POA: Insufficient documentation

## 2011-11-28 DIAGNOSIS — R0602 Shortness of breath: Secondary | ICD-10-CM | POA: Insufficient documentation

## 2011-11-28 DIAGNOSIS — Z01812 Encounter for preprocedural laboratory examination: Secondary | ICD-10-CM | POA: Insufficient documentation

## 2011-11-28 HISTORY — PX: BREAST LUMPECTOMY: SHX2

## 2011-11-28 HISTORY — PX: AXILLARY LYMPH NODE DISSECTION: SHX5229

## 2011-11-28 HISTORY — DX: Anemia, unspecified: D64.9

## 2011-11-28 SURGERY — BREAST LUMPECTOMY WITH NEEDLE LOCALIZATION
Anesthesia: General | Site: Breast | Laterality: Left | Wound class: Clean

## 2011-11-28 MED ORDER — ROCURONIUM BROMIDE 100 MG/10ML IV SOLN
INTRAVENOUS | Status: DC | PRN
  Administered 2011-11-28: 50 mg via INTRAVENOUS

## 2011-11-28 MED ORDER — TRAMADOL HCL 50 MG PO TABS: 50.0000 mg | ORAL_TABLET | Freq: Four times a day (QID) | ORAL | Status: DC | PRN

## 2011-11-28 MED ORDER — KCL IN DEXTROSE-NACL 20-5-0.45 MEQ/L-%-% IV SOLN
INTRAVENOUS | Status: DC
  Administered 2011-11-28 – 2011-11-29 (×2): via INTRAVENOUS
  Filled 2011-11-28 (×3): qty 1000

## 2011-11-28 MED ORDER — KETOROLAC TROMETHAMINE 30 MG/ML IJ SOLN: 15.0000 mg | Freq: Once | INTRAMUSCULAR | Status: DC | PRN

## 2011-11-28 MED ORDER — GLYCOPYRROLATE 0.2 MG/ML IJ SOLN
INTRAMUSCULAR | Status: DC | PRN
  Administered 2011-11-28: .8 mg via INTRAVENOUS

## 2011-11-28 MED ORDER — LACTATED RINGERS IV SOLN
INTRAVENOUS | Status: DC | PRN
  Administered 2011-11-28 (×2): via INTRAVENOUS

## 2011-11-28 MED ORDER — ONDANSETRON HCL 4 MG/2ML IJ SOLN
INTRAMUSCULAR | Status: DC | PRN
  Administered 2011-11-28: 4 mg via INTRAVENOUS

## 2011-11-28 MED ORDER — CHLORHEXIDINE GLUCONATE 4 % EX LIQD: 1.0000 "application " | Freq: Once | CUTANEOUS | Status: DC

## 2011-11-28 MED ORDER — TOBRAMYCIN-DEXAMETHASONE 0.3-0.1 % OP SUSP
1.0000 [drp] | OPHTHALMIC | Status: DC
  Administered 2011-11-28 – 2011-11-29 (×3): 1 [drp] via OPHTHALMIC
  Filled 2011-11-28 (×2): qty 2.5

## 2011-11-28 MED ORDER — LACTATED RINGERS IV SOLN: INTRAVENOUS | Status: DC

## 2011-11-28 MED ORDER — BUPIVACAINE-EPINEPHRINE 0.25% -1:200000 IJ SOLN
INTRAMUSCULAR | Status: DC | PRN
  Administered 2011-11-28: 30 mL

## 2011-11-28 MED ORDER — ONDANSETRON HCL 4 MG/2ML IJ SOLN: 4.0000 mg | Freq: Four times a day (QID) | INTRAMUSCULAR | Status: DC | PRN

## 2011-11-28 MED ORDER — PROPOFOL 10 MG/ML IV EMUL
INTRAVENOUS | Status: DC | PRN
  Administered 2011-11-28: 160 mg via INTRAVENOUS

## 2011-11-28 MED ORDER — LORATADINE 10 MG PO TABS
10.0000 mg | ORAL_TABLET | Freq: Every day | ORAL | Status: DC | PRN
  Administered 2011-11-29: 10 mg via ORAL
  Filled 2011-11-28: qty 1

## 2011-11-28 MED ORDER — 0.9 % SODIUM CHLORIDE (POUR BTL) OPTIME
TOPICAL | Status: DC | PRN
  Administered 2011-11-28: 1000 mL

## 2011-11-28 MED ORDER — GABAPENTIN 300 MG PO CAPS
300.0000 mg | ORAL_CAPSULE | Freq: Three times a day (TID) | ORAL | Status: DC
  Administered 2011-11-28 (×2): 300 mg via ORAL
  Filled 2011-11-28 (×5): qty 2

## 2011-11-28 MED ORDER — LACTATED RINGERS IV SOLN
INTRAVENOUS | Status: DC
  Administered 2011-11-28: 10:00:00 via INTRAVENOUS

## 2011-11-28 MED ORDER — NEOSTIGMINE METHYLSULFATE 1 MG/ML IJ SOLN
INTRAMUSCULAR | Status: DC | PRN
  Administered 2011-11-28: 4 mg via INTRAVENOUS

## 2011-11-28 MED ORDER — OXYCODONE-ACETAMINOPHEN 5-325 MG PO TABS
1.0000 | ORAL_TABLET | ORAL | Status: DC | PRN
  Administered 2011-11-28 – 2011-11-29 (×3): 2 via ORAL
  Filled 2011-11-28 (×3): qty 2

## 2011-11-28 MED ORDER — HEPARIN SODIUM (PORCINE) 5000 UNIT/ML IJ SOLN
5000.0000 [IU] | Freq: Three times a day (TID) | INTRAMUSCULAR | Status: DC
  Administered 2011-11-29: 5000 [IU] via SUBCUTANEOUS
  Filled 2011-11-28 (×4): qty 1

## 2011-11-28 MED ORDER — HYDROMORPHONE HCL PF 1 MG/ML IJ SOLN
INTRAMUSCULAR | Status: AC
  Filled 2011-11-28: qty 1

## 2011-11-28 MED ORDER — MIDAZOLAM HCL 2 MG/2ML IJ SOLN: 0.5000 mg | Freq: Once | INTRAMUSCULAR | Status: DC | PRN

## 2011-11-28 MED ORDER — MIDAZOLAM HCL 5 MG/5ML IJ SOLN
INTRAMUSCULAR | Status: DC | PRN
  Administered 2011-11-28: 2 mg via INTRAVENOUS

## 2011-11-28 MED ORDER — LORAZEPAM 0.5 MG PO TABS: 0.5000 mg | ORAL_TABLET | Freq: Four times a day (QID) | ORAL | Status: DC | PRN

## 2011-11-28 MED ORDER — HYDROMORPHONE HCL PF 1 MG/ML IJ SOLN
0.2500 mg | INTRAMUSCULAR | Status: DC | PRN
  Administered 2011-11-28: 0.5 mg via INTRAVENOUS

## 2011-11-28 MED ORDER — MEPERIDINE HCL 25 MG/ML IJ SOLN: 6.2500 mg | INTRAMUSCULAR | Status: DC | PRN

## 2011-11-28 MED ORDER — LIDOCAINE HCL (CARDIAC) 20 MG/ML IV SOLN
INTRAVENOUS | Status: DC | PRN
  Administered 2011-11-28: 80 mg via INTRAVENOUS

## 2011-11-28 MED ORDER — FENTANYL CITRATE 0.05 MG/ML IJ SOLN
INTRAMUSCULAR | Status: DC | PRN
  Administered 2011-11-28 (×2): 25 ug via INTRAVENOUS
  Administered 2011-11-28: 100 ug via INTRAVENOUS
  Administered 2011-11-28: 50 ug via INTRAVENOUS

## 2011-11-28 MED ORDER — BUPIVACAINE-EPINEPHRINE PF 0.25-1:200000 % IJ SOLN
INTRAMUSCULAR | Status: AC
  Filled 2011-11-28: qty 30

## 2011-11-28 MED ORDER — PROMETHAZINE HCL 25 MG/ML IJ SOLN: 6.2500 mg | INTRAMUSCULAR | Status: DC | PRN

## 2011-11-28 MED ORDER — MORPHINE SULFATE 2 MG/ML IJ SOLN
2.0000 mg | INTRAMUSCULAR | Status: DC | PRN
  Administered 2011-11-28 (×2): 2 mg via INTRAVENOUS
  Filled 2011-11-28 (×2): qty 1

## 2011-11-28 MED ORDER — FLUCONAZOLE 100 MG PO TABS
100.0000 mg | ORAL_TABLET | Freq: Every day | ORAL | Status: DC
  Administered 2011-11-28: 100 mg via ORAL
  Filled 2011-11-28 (×2): qty 1

## 2011-11-28 SURGICAL SUPPLY — 64 items
ADH SKN CLS APL DERMABOND .7 (GAUZE/BANDAGES/DRESSINGS) ×2
APPLIER CLIP 11 MED OPEN (CLIP) ×6
APR CLP MED 11 20 MLT OPN (CLIP) ×4
BINDER BREAST XXLRG (GAUZE/BANDAGES/DRESSINGS) ×3 IMPLANT
BLADE SURG 10 STRL SS (BLADE) ×3 IMPLANT
BLADE SURG 15 STRL LF DISP TIS (BLADE) ×2 IMPLANT
BLADE SURG 15 STRL SS (BLADE) ×2
CANISTER SUCTION 2500CC (MISCELLANEOUS) ×3 IMPLANT
CHLORAPREP W/TINT 10.5 ML (MISCELLANEOUS) ×3 IMPLANT
CHLORAPREP W/TINT 26ML (MISCELLANEOUS) ×3 IMPLANT
CLIP APPLIE 11 MED OPEN (CLIP) ×4 IMPLANT
CLIP LIGATING EXTRA SM BLUE (MISCELLANEOUS) ×3 IMPLANT
CLOTH BEACON ORANGE TIMEOUT ST (SAFETY) ×3 IMPLANT
CONT SPEC 4OZ CLIKSEAL STRL BL (MISCELLANEOUS) ×3 IMPLANT
COVER SURGICAL LIGHT HANDLE (MISCELLANEOUS) ×3 IMPLANT
DECANTER SPIKE VIAL GLASS SM (MISCELLANEOUS) ×3 IMPLANT
DERMABOND ADVANCED (GAUZE/BANDAGES/DRESSINGS) ×1
DERMABOND ADVANCED .7 DNX12 (GAUZE/BANDAGES/DRESSINGS) ×2 IMPLANT
DEVICE DUBIN SPECIMEN MAMMOGRA (MISCELLANEOUS) ×3 IMPLANT
DRAIN CHANNEL 19F RND (DRAIN) ×3 IMPLANT
DRAPE CHEST BREAST 15X10 FENES (DRAPES) ×3 IMPLANT
DRAPE PED LAPAROTOMY (DRAPES) ×3 IMPLANT
DRAPE UTILITY 15X26 W/TAPE STR (DRAPE) ×3 IMPLANT
ELECT CAUTERY BLADE 6.4 (BLADE) ×3 IMPLANT
ELECT COATED BLADE 2.86 ST (ELECTRODE) ×3 IMPLANT
ELECT REM PT RETURN 9FT ADLT (ELECTROSURGICAL) ×3
ELECTRODE REM PT RTRN 9FT ADLT (ELECTROSURGICAL) ×2 IMPLANT
EVACUATOR SILICONE 100CC (DRAIN) ×3 IMPLANT
GLOVE BIOGEL PI IND STRL 7.0 (GLOVE) ×6 IMPLANT
GLOVE BIOGEL PI IND STRL 8 (GLOVE) ×2 IMPLANT
GLOVE BIOGEL PI INDICATOR 7.0 (GLOVE) ×3
GLOVE BIOGEL PI INDICATOR 8 (GLOVE) ×1
GLOVE ORTHOPEDIC STR SZ6.5 (GLOVE) ×3 IMPLANT
GLOVE SS BIOGEL STRL SZ 7.5 (GLOVE) ×2 IMPLANT
GLOVE SUPERSENSE BIOGEL SZ 7.5 (GLOVE) ×1
GOWN PREVENTION PLUS XLARGE (GOWN DISPOSABLE) IMPLANT
GOWN STRL NON-REIN LRG LVL3 (GOWN DISPOSABLE) ×3 IMPLANT
GOWN STRL REIN XL XLG (GOWN DISPOSABLE) ×3 IMPLANT
KIT BASIN OR (CUSTOM PROCEDURE TRAY) ×3 IMPLANT
KIT MARKER MARGIN INK (KITS) ×3 IMPLANT
KIT ROOM TURNOVER OR (KITS) ×3 IMPLANT
NEEDLE HYPO 25GX1X1/2 BEV (NEEDLE) IMPLANT
NS IRRIG 1000ML POUR BTL (IV SOLUTION) ×3 IMPLANT
PACK SURGICAL SETUP 50X90 (CUSTOM PROCEDURE TRAY) ×3 IMPLANT
PAD ARMBOARD 7.5X6 YLW CONV (MISCELLANEOUS) ×6 IMPLANT
PENCIL BUTTON HOLSTER BLD 10FT (ELECTRODE) ×3 IMPLANT
SPONGE GAUZE 4X4 12PLY (GAUZE/BANDAGES/DRESSINGS) ×3 IMPLANT
SPONGE LAP 4X18 X RAY DECT (DISPOSABLE) ×3 IMPLANT
STAPLER VISISTAT 35W (STAPLE) ×3 IMPLANT
SUT ETHILON 3 0 PS 1 (SUTURE) ×3 IMPLANT
SUT MON AB 4-0 PC3 18 (SUTURE) ×3 IMPLANT
SUT SILK 2 0 SH (SUTURE) IMPLANT
SUT VIC AB 3-0 54X BRD REEL (SUTURE) ×2 IMPLANT
SUT VIC AB 3-0 BRD 54 (SUTURE) ×2
SUT VIC AB 3-0 SH 27 (SUTURE) ×3
SUT VIC AB 3-0 SH 27XBRD (SUTURE) ×2 IMPLANT
SYR BULB 3OZ (MISCELLANEOUS) ×3 IMPLANT
SYR CONTROL 10ML LL (SYRINGE) ×3 IMPLANT
TAPE CLOTH SURG 4X10 WHT LF (GAUZE/BANDAGES/DRESSINGS) ×3 IMPLANT
TOWEL OR 17X24 6PK STRL BLUE (TOWEL DISPOSABLE) ×3 IMPLANT
TOWEL OR 17X26 10 PK STRL BLUE (TOWEL DISPOSABLE) ×3 IMPLANT
TUBE CONNECTING 12X1/4 (SUCTIONS) ×3 IMPLANT
WATER STERILE IRR 1000ML POUR (IV SOLUTION) ×3 IMPLANT
YANKAUER SUCT BULB TIP NO VENT (SUCTIONS) ×3 IMPLANT

## 2011-11-28 NOTE — Anesthesia Postprocedure Evaluation (Signed)
  Anesthesia Post-op Note  Patient: Kelsey Wilson  Procedure(s) Performed: Procedure(s) (LRB): BREAST LUMPECTOMY WITH NEEDLE LOCALIZATION (Left) AXILLARY LYMPH NODE DISSECTION (Left)  Patient Location: PACU  Anesthesia Type: General  Level of Consciousness: awake  Airway and Oxygen Therapy: Patient Spontanous Breathing  Post-op Pain: mild  Post-op Assessment: Post-op Vital signs reviewed  Post-op Vital Signs: stable  Complications: No apparent anesthesia complications

## 2011-11-28 NOTE — Preoperative (Signed)
Beta Blockers   Reason not to administer Beta Blockers:Not Applicable. No home beta blockers 

## 2011-11-28 NOTE — Anesthesia Preprocedure Evaluation (Addendum)
Anesthesia Evaluation  Patient identified by MRN, date of birth, ID band Patient awake    Reviewed: Allergy & Precautions, H&P , NPO status , Patient's Chart, lab work & pertinent test results, reviewed documented beta blocker date and time   Airway Mallampati: II      Dental  (+) Teeth Intact and Dental Advisory Given   Pulmonary shortness of breath,          Cardiovascular     Neuro/Psych  Headaches,    GI/Hepatic   Endo/Other    Renal/GU      Musculoskeletal   Abdominal   Peds  Hematology   Anesthesia Other Findings   Reproductive/Obstetrics                           Anesthesia Physical Anesthesia Plan  ASA: III  Anesthesia Plan: General   Post-op Pain Management:    Induction:   Airway Management Planned: Oral ETT  Additional Equipment:   Intra-op Plan:   Post-operative Plan: Extubation in OR  Informed Consent: I have reviewed the patients History and Physical, chart, labs and discussed the procedure including the risks, benefits and alternatives for the proposed anesthesia with the patient or authorized representative who has indicated his/her understanding and acceptance.   Dental advisory given  Plan Discussed with: CRNA and Anesthesiologist  Anesthesia Plan Comments:         Anesthesia Quick Evaluation

## 2011-11-28 NOTE — H&P (Signed)
Chief complaint:  breast cancer  History: Patient presents for surgical treatment forf her T3 N1 cancer of the left breast diagnosed in January. She was HER-2 negative and weekly ER positive. She had a 5 cm tumor by MR and a positive biopsy of an enlarged axillary lymph node. This was located in the tail of [place]. She has undergone neoadjuvant chemotherapy. She has tolerated this reasonably well. MR was recently performed which has shown complete resolution of the abnormality in the breast and improvement in the axillary lymph nodes although several remain mildly abnormal.  Past Medical History   Diagnosis  Date   .  Night sweats    .  Fatigue    .  Wears glasses    .  SOB (shortness of breath) on exertion      walking and stairts   .  Arthritis    .  Hot flashes    .  Blood transfusion    .  Diverticulitis of colon    .  Cancer      left breast cancer with chemo dx 1/13    Past Surgical History   Procedure  Date   .  Cholecystectomy    .  Appendectomy    .  Carpal tunnel release      Bilateral   .  Tonsillectomy    .  Spur      Apex spur on both big toes   .  Knee surgery      Left Knee   .  Portacath placement  06/18/2011     Procedure: INSERTION PORT-A-CATH; Surgeon: Mariella Saa, MD; Location: [facility]; Service: General; Laterality: Right; right subclavian    Current Outpatient Prescriptions   Medication  Sig  Dispense  Refill   .  dexamethasone (DECADRON) 4 MG tablet  Take 2 tabs two times a day the day before Taxotere chemo. Then take 2 tabs daily starting the day after chemo for 2 days.  30 tablet  1   .  gabapentin (NEURONTIN) 100 MG capsule  Take 3 capsules (300 mg total) by mouth 3 (three) times daily.  90 capsule  2   .  HYDROcodone-acetaminophen (VICODIN) 5-500 MG per tablet  Take 1 tablet by mouth every 6 (six) hours as needed. 1 po q6-8h prn pain #90 w/1 RF, 08/27/11-CTS     .  lidocaine-prilocaine (EMLA) cream  Apply topically as directed. ONE  TO TWO HOURS BEFORE TREATMENT.  60 g  1   .  loratadine (CLARITIN) 10 MG tablet  Take 10 mg by mouth daily.     Marland Kitchen  LORazepam (ATIVAN) 0.5 MG tablet  Take 1 tablet (0.5 mg total) by mouth every 6 (six) hours as needed (Nausea or vomiting).  30 tablet  0   .  ondansetron (ZOFRAN) 8 MG tablet  Take 1 tablet two times a day for 2 days starting the day after chemo, then take two times a day as needed for nausea or vomiting.  30 tablet  1   .  prochlorperazine (COMPAZINE) 10 MG tablet  Take 1 tablet (10 mg total) by mouth every 6 (six) hours as needed (Nausea or vomiting).  30 tablet  1   .  prochlorperazine (COMPAZINE) 25 MG suppository  Place 1 suppository (25 mg total) rectally every 12 (twelve) hours as needed for nausea.  12 suppository  3   .  valACYclovir (VALTREX) 500 MG tablet  Take 500 mg by mouth 2 (two)  times daily.      Allergies   Allergen  Reactions   .  Compazine  Other (See Comments)     Numbness of face and lips   .  Penicillins  Nausea Only    Exam:  Gen.: Moderately obese no acute distress  HEENT: Alopecia  Lymph nodes: No palpable cervical, supraclavicular or axillary nodes  Breasts: I cannot feel any masses in either breast particular attention to the tail of [place] on the left  Abdomen: Soft and nontender. No organomegaly  Extremities: No edema   Assessment and plan: T3 N1 HER-2 negative weekly ER positive high-grade cancer of the tail of [place] of the left breast with essentially complete radiologic response to neoadjuvant chemotherapy. We discussed her previous plan to proceed with needle localized left breast lumpectomy and axillary dissection. I discussed the procedure in detail including alternatives and risks of anesthetic complications, bleeding, infection, lymphedema. All her questions were answered.  Mariella Saa MD, FACS  11/28/2011, 8:48 AM

## 2011-11-28 NOTE — Transfer of Care (Signed)
Immediate Anesthesia Transfer of Care Note  Patient: Kelsey Wilson  Procedure(s) Performed: Procedure(s) (LRB): BREAST LUMPECTOMY WITH NEEDLE LOCALIZATION (Left) AXILLARY LYMPH NODE DISSECTION (Left)  Patient Location: PACU  Anesthesia Type: General  Level of Consciousness: awake and alert   Airway & Oxygen Therapy: Patient Spontanous Breathing and Patient connected to nasal cannula oxygen  Post-op Assessment: Report given to PACU RN and Post -op Vital signs reviewed and stable  Post vital signs: Reviewed and stable  Complications: No apparent anesthesia complications

## 2011-11-28 NOTE — Op Note (Signed)
Preoperative Diagnosis: cancer left breast   Postoprative Diagnosis: cancer left breast   Procedure: Procedure(s): LEFT BREAST LUMPECTOMY WITH NEEDLE LOCALIZATION AXILLARY LYMPH NODE DISSECTION   Surgeon: Glenna Fellows T   Assistants: None  Anesthesia:  General endotracheal anesthesiaDiagnos  Indications: patient is a 51 year old female who diagnosed earlier this year with locally advanced cancer of the left breast with a 5 cm mass in the tail of Spence and positive core biopsy of a left axillary lymph node. She has undergone neoadjuvant chemotherapy with excellent clinical response and no residual tumor based on MRI and physical exam. We have recommended at this time proceeding with needle localized lumpectomy and axillary dissection as previously discussed. The procedure and risks have been discussed with the patient extensively detailed elsewhere.    Procedure Detail:following successful preoperative needle localization the patient is brought to the operating room, placed in the supine position on the operating table, and general endotracheal anesthesia induced. She was carefully positioned with her left arm extended on an arm board and the entire left breast, chest, axilla, and upper arm were widely sterilely prepped and draped. Patient timeout was performed Krick procedure verified. She received preoperative IV antibiotics. The needle entered at essentially the left axilla with the marking clip in the tail of Spence. I elected to use one incision and I made a transverse incision in the left axilla between the pectoralis and latissimus and dissection was carried down into the subcutaneous tissue. Then working medially toward the tail of Spence of the breast a generous specimen of breast tissue was excised around the shaft and the tip of the localizing wire with cautery. The specimen was oriented with ink. Specimen mammography showed the marking clip and the wire in the center of the  specimen. This was sent for permanent pathology. Through the same incision I then proceeded with the axillary dissection. The dissection was deepened down to the edge of the pectoralis major with cautery and the clavipectoral fascia incised and the pectoralis minor identified and retracted medially. Inferior attachments toward the breast had essentially already been divided with the lobectomy and I dissected laterally out to the edge of the latissimus dorsi which was defined up toward the axilla. Retracting the pectoralis minor medially and beginning at about its medial edge all fibrofatty tissue inferior to the axillary vein which was carefully identified and protected was dissected laterally and inferiorly. Individual perforating branches of the axillary artery and vein were divided between clips. As the dissection proceeded laterally I clearly identified the thoracodorsal nerve and vessels as well as the long thoracic nerve. These structures were carefully protected and all fibrofatty tissue between them down to the subscapularis muscle was swept inferiorly. Finally attachments along the latissimus and lateral chest wall were divided with cautery and the specimen was removed. Both nerves were intact and functioning. Complete hemostasis was assured. The wound was irrigated. Soft tissue was infiltrated with Marcaine with epinephrine. A 19 Blake closed suction drain was left through lateral stab wound in the axilla. The subcutaneous tissue was closed with interrupted 3-0 Vicryl and the skin with staples. Dry sterilel dressing was applied. Sponge needle and instrument counts were correct.   Estimated Blood Loss:  Minimal         Drains: 19 Blake  Blood Given: none          Specimens: #1 left breast lumpectomy #2 left axillary contents        Complications:  * No complications entered in OR log *  Disposition: PACU - hemodynamically stable.         Condition: stable  Mariella Saa MD,  FACS  11/28/2011, 11:55 AM

## 2011-11-28 NOTE — Anesthesia Procedure Notes (Signed)
Procedure Name: Intubation Date/Time: 11/28/2011 10:23 AM Performed by: Garen Lah Pre-anesthesia Checklist: Timeout performed, Patient identified, Emergency Drugs available, Suction available and Patient being monitored Patient Re-evaluated:Patient Re-evaluated prior to inductionOxygen Delivery Method: Circle system utilized Preoxygenation: Pre-oxygenation with 100% oxygen Intubation Type: IV induction Ventilation: Mask ventilation without difficulty and Oral airway inserted - appropriate to patient size Laryngoscope Size: Mac and 3 Grade View: Grade II Tube type: Oral Tube size: 7.5 mm Number of attempts: 1 Airway Equipment and Method: Stylet Placement Confirmation: ETT inserted through vocal cords under direct vision,  positive ETCO2 and breath sounds checked- equal and bilateral Secured at: 21 cm Tube secured with: Tape Dental Injury: Teeth and Oropharynx as per pre-operative assessment

## 2011-11-29 ENCOUNTER — Telehealth (INDEPENDENT_AMBULATORY_CARE_PROVIDER_SITE_OTHER): Payer: Self-pay | Admitting: General Surgery

## 2011-11-29 ENCOUNTER — Encounter (HOSPITAL_COMMUNITY): Payer: Self-pay | Admitting: General Surgery

## 2011-11-29 MED ORDER — OXYCODONE-ACETAMINOPHEN 5-325 MG PO TABS: 1.0000 | ORAL_TABLET | ORAL | Status: AC | PRN

## 2011-11-29 NOTE — Telephone Encounter (Signed)
She stated she was having drainage around her dressing and not much into her drain bulb.  I instructed her to strip the drain and reinforce the dressing with bulky dry pads.  If this did not help, I told her to call the office tomorrow morning and request to be seen.

## 2011-11-29 NOTE — Discharge Summary (Signed)
Patient ID: Kelsey Wilson [id number] 50 y.o. Nov 22, 1960  11/28/2011  Discharge date and time: 11/29/2011   Admitting Physician: Glenna Fellows T  Discharge Physician: Glenna Fellows T  Admission Diagnoses: cancer left breast   Discharge Diagnoses: same  Operations: Procedure(s): BREAST LUMPECTOMY WITH NEEDLE LOCALIZATION AXILLARY LYMPH NODE DISSECTION  Admission Condition: good  Discharged Condition: good  Indication for Admission: patient is a 51 year old female status post neoadjuvant chemotherapy for locally advanced cancer of the left breast positive preoperative axillary lymph node biopsy. She had excellent clinical and radiographic response and we have recommended proceeding with needle localized lumpectomy and axillary dissection. She is admitted for this procedure.  Hospital Course: the patient underwent uneventful needle localized left breast lumpectomy and axillary dissection. There was no gross evidence of tumor during surgery. The following day she had some expected discomfort but was up and around without difficulty and pain was well-controlled. Drainage is serous sanguinous. Dressing is clean and dry and there is no swelling or bleeding. She is doing well and felt ready for discharge.   Disposition: Home  Patient Instructions:   [name], [name]  Home Medication Instructions [id number]   Printed on:11/29/11 0850  Medication Information                    loratadine (CLARITIN) 10 MG tablet Take 10 mg by mouth daily as needed. "For adverse reaction to shots"           lidocaine-prilocaine (EMLA) cream Apply 1 application topically daily as needed. Use 1 to 2 hours prior to treatment.           ondansetron (ZOFRAN) 8 MG tablet Take 8 mg by mouth every 12 (twelve) hours as needed. For nausea and vomiting.           traMADol (ULTRAM) 50 MG tablet Take 50-100 mg by mouth every 6 (six) hours as needed. For pain           acetaminophen (TYLENOL)  500 MG tablet Take 500 mg by mouth every 6 (six) hours as needed. For pain.           tobramycin-dexamethasone (TOBRADEX) ophthalmic solution Place 1 drop into both eyes every 4 (four) hours while awake.           LORazepam (ATIVAN) 0.5 MG tablet Take 1 tablet (0.5 mg total) by mouth every 6 (six) hours as needed. For anxiety.           fluconazole (DIFLUCAN) 100 MG tablet Take 100 mg by mouth daily. For 10 days           HYDROcodone-acetaminophen (VICODIN) 5-500 MG per tablet Take 1-2 tablets by mouth every 6 (six) hours as needed. For pain           gabapentin (NEURONTIN) 300 MG capsule Take 300-600 mg by mouth 3 (three) times daily.           oxyCODONE-acetaminophen (PERCOCET) 5-325 MG per tablet Take 1-2 tablets by mouth every 6 (six) hours as needed for pain.           oxyCODONE-acetaminophen (PERCOCET) 5-325 MG per tablet Take 1-2 tablets by mouth every 4 (four) hours as needed.             Activity: activity as tolerated Diet: regular diet Wound Care: empty JP drain daily. May remove bandage after 2 days.  Follow-up:  With Dr. Johna Sheriff in 1 week.  Signed: Mariella Saa MD, FACS  11/29/2011, 8:50 AM

## 2011-11-29 NOTE — Progress Notes (Signed)
Patient discharged to home in care of children and family. Medications and instructions reviewed with patient and family with no questions. JP teaching reinforced. IV d/c'd with cath intact. Assessment unchanged from this am. Patient is to follow up with Dr. Johna Sheriff in 1 week.

## 2011-11-29 NOTE — Discharge Instructions (Signed)
[facility] °Office Phone Number [phone] ° °BREAST BIOPSY/ PARTIAL MASTECTOMY: POST OP INSTRUCTIONS ° °Always review your discharge instruction sheet given to you by the facility where your surgery was performed. ° °IF YOU HAVE DISABILITY OR FAMILY LEAVE FORMS, YOU MUST BRING THEM TO THE OFFICE FOR PROCESSING.  DO NOT GIVE THEM TO YOUR DOCTOR. ° °1. A prescription for pain medication may be given to you upon discharge.  Take your pain medication as prescribed, if needed.  If narcotic pain medicine is not needed, then you may take acetaminophen (Tylenol) or ibuprofen (Advil) as needed. °2. Take your usually prescribed medications unless otherwise directed °3. If you need a refill on your pain medication, please contact your pharmacy.  They will contact our office to request authorization.  Prescriptions will not be filled after 5pm or on week-ends. °4. You should eat very light the first 24 hours after surgery, such as soup, crackers, pudding, etc.  Resume your normal diet the day after surgery. °5. Most patients will experience some swelling and bruising in the breast.  Ice packs and a good support bra will help.  Swelling and bruising can take several days to resolve.  °6. It is common to experience some constipation if taking pain medication after surgery.  Increasing fluid intake and taking a stool softener will usually help or prevent this problem from occurring.  A mild laxative (Milk of Magnesia or Miralax) should be taken according to package directions if there are no bowel movements after 48 hours. °7. Unless discharge instructions indicate otherwise, you may remove your bandages 24-48 hours after surgery, and you may shower at that time.  You may have steri-strips (small skin tapes) in place directly over the incision.  These strips should be left on the skin for 7-10 days.  If your surgeon used skin glue on the incision, you may shower in 24 hours.  The glue will flake off over the  next 2-3 weeks.  Any sutures or staples will be removed at the office during your follow-up visit. °8. ACTIVITIES:  You may resume regular daily activities (gradually increasing) beginning the next day.  Wearing a good support bra or sports bra minimizes pain and swelling.  You may have sexual intercourse when it is comfortable. °a. You may drive when you no longer are taking prescription pain medication, you can comfortably wear a seatbelt, and you can safely maneuver your car and apply brakes. °b. RETURN TO WORK:  ______________________________________________________________________________________ °9. You should see your doctor in the office for a follow-up appointment approximately two weeks after your surgery.  Your doctor’s nurse will typically make your follow-up appointment when she calls you with your pathology report.  Expect your pathology report 2-3 business days after your surgery.  You may call to check if you do not hear from us after three days. °10. OTHER INSTRUCTIONS: _______________________________________________________________________________________________ _____________________________________________________________________________________________________________________________________ °_____________________________________________________________________________________________________________________________________ °_____________________________________________________________________________________________________________________________________ ° °WHEN TO CALL YOUR DOCTOR: °1. Fever over 101.0 °2. Nausea and/or vomiting. °3. Extreme swelling or bruising. °4. Continued bleeding from incision. °5. Increased pain, redness, or drainage from the incision. ° °The clinic staff is available to answer your questions during regular business hours.  Please don’t hesitate to call and ask to speak to one of the nurses for clinical concerns.  If you have a medical emergency, go to the nearest  emergency room or call 911.  A surgeon from [facility] is always on call at the hospital. ° °For further questions, please visit [email or web address]  °

## 2011-11-30 ENCOUNTER — Encounter (INDEPENDENT_AMBULATORY_CARE_PROVIDER_SITE_OTHER): Payer: Self-pay | Admitting: Surgery

## 2011-11-30 ENCOUNTER — Ambulatory Visit (INDEPENDENT_AMBULATORY_CARE_PROVIDER_SITE_OTHER): Admitting: Surgery

## 2011-11-30 VITALS — BP 128/82 | HR 68 | Temp 98.0°F | Resp 18 | Ht 63.0 in | Wt 229.2 lb

## 2011-11-30 DIAGNOSIS — C50419 Malignant neoplasm of upper-outer quadrant of unspecified female breast: Secondary | ICD-10-CM

## 2011-11-30 NOTE — Progress Notes (Signed)
General [facility]  Visit Diagnoses: 1. Cancer of upper-outer quadrant of female breast     HISTORY: Patient is a 51 year old black female who underwent left lumpectomy and axillary lymph node dissection by Dr. Jaclynn Guarneri two days ago. She presents with pain and drainage.  EXAM: Examination shows a wound on the left breast with staples in place. Drain is in place from the left axilla. Serosanguineous fluid is in the tubing. There's drainage at the site. The drain bulb but is not compressed.  IMPRESSION: Left breast cancer, malfunctioning drain following surgery  PLAN: Patient and her family members are instructed in drain care. Essentially, the drain was not functioning because they did not know how to empty and charge the bulb for the drain. We instructed them in drain care. I think she will do fine and will followup with Dr. Johna Sheriff as scheduled.  Velora Heckler, MD, FACS General & Endocrine Surgery [facility]

## 2011-11-30 NOTE — Patient Instructions (Signed)
Drain care as instructed.  Velora Heckler, MD, [facility] Office: [phone]

## 2011-12-03 ENCOUNTER — Telehealth: Payer: Self-pay | Admitting: *Deleted

## 2011-12-03 ENCOUNTER — Ambulatory Visit (HOSPITAL_COMMUNITY)
Admission: RE | Admit: 2011-12-03 | Discharge: 2011-12-03 | Disposition: A | Discharge status: Home / Self Care | Source: Ambulatory Visit | Attending: Oncology | Admitting: Oncology

## 2011-12-03 ENCOUNTER — Telehealth (INDEPENDENT_AMBULATORY_CARE_PROVIDER_SITE_OTHER): Payer: Self-pay | Admitting: General Surgery

## 2011-12-03 ENCOUNTER — Other Ambulatory Visit: Payer: Self-pay | Admitting: Physician Assistant

## 2011-12-03 DIAGNOSIS — C50419 Malignant neoplasm of upper-outer quadrant of unspecified female breast: Secondary | ICD-10-CM

## 2011-12-03 DIAGNOSIS — M7989 Other specified soft tissue disorders: Secondary | ICD-10-CM

## 2011-12-03 NOTE — Telephone Encounter (Signed)
Discussed path report with patient

## 2011-12-03 NOTE — Telephone Encounter (Signed)
VASCULAR LAB CALLED. PT.'S BILATERAL VENOUS DOPPLER STUDY WAS NEGATIVE. LEFT MESSAGE ON PT.'S VOICE MAIL TO ELEVATE LEGS AND USE PAIN MEDICATION. IF ANY QUESTIONS PT. WILL CALL THE OFFICE TOMORROW MORNING AS THE OFFICE IS CLOSED

## 2011-12-03 NOTE — Telephone Encounter (Addendum)
PT. HAD BREAST SURGERY ON 11/28/11. BOTH LEGS ARE SWOLLEN BUT RIGHT IS GREATER THAN LEFT. PT.'S LEGS ARE ACHY BEHIND THE CALVES. NO REDNESS OR INCREASED WARMTH. WHEN PT. IS IN BED SHE HAS SOME PAIN ON A SCALE OF NINE AT THE TOP OF BOTH THIGHS. THE PAIN IS WORSE IN RIGHT THIGH. THIS NOTE WAS GIVEN TO CHRIS SCHERER, PA. VERBAL ORDER AND READ BACK TO CHRIS SCHERER,PA- PT. NEEDS A BILATERAL VENOUS DOPPLER TO RULE OUT A DVT. ALSO CHECK TO SEE IF PT. IS TAKING THE GABAPENTIN, BILATERAL VENOUS DOPPLER TO BE DONE AT [place] TODAY AT 2:30PM. PT. NOTIFIED AND VOICES UNDERSTANDING, SPOKE WITH PT. SHE IS TAKING THE GABAPENTIN. NOTIFIED CHRIS SCHERER,PA. NO NEW ORDERS.

## 2011-12-03 NOTE — Progress Notes (Signed)
*  Preliminary Results* Bilateral lower extremity venous duplex completed. Bilateral lower extremities are negative for deep vein thrombosis.  12/03/2011 3:23 PM  Elpidio Galea, RDMS,RDCS

## 2011-12-04 ENCOUNTER — Ambulatory Visit (INDEPENDENT_AMBULATORY_CARE_PROVIDER_SITE_OTHER): Admitting: General Surgery

## 2011-12-04 ENCOUNTER — Encounter (INDEPENDENT_AMBULATORY_CARE_PROVIDER_SITE_OTHER): Payer: Self-pay | Admitting: General Surgery

## 2011-12-04 VITALS — BP 134/86 | HR 95 | Ht 63.0 in | Wt 229.2 lb

## 2011-12-04 DIAGNOSIS — C50419 Malignant neoplasm of upper-outer quadrant of unspecified female breast: Secondary | ICD-10-CM

## 2011-12-04 NOTE — Patient Instructions (Signed)
Emptied drain daily and write down the amount  Change Band-Aid or a gauze dressing over the drain daily  May shower tomorrow

## 2011-12-04 NOTE — Progress Notes (Signed)
Patient returns 6 days following left breast lumpectomy and axillary dissection status post neoadjuvant chemotherapy for locally advanced cancer of the left breast. She has some aching and soreness as expected but no major problems.  On her exam her wound is healing nicely and the staples are removed. Her drain site is clean. She has about 20 cc of serous drainage over 12 hours and her drain was left in place.  We had previously reviewed her pathology that showed no residual cancer in her breast or in 17 lymph nodes.  She will return in one week for possible drain removal.

## 2011-12-14 ENCOUNTER — Encounter (INDEPENDENT_AMBULATORY_CARE_PROVIDER_SITE_OTHER): Payer: Self-pay | Admitting: General Surgery

## 2011-12-14 ENCOUNTER — Ambulatory Visit (INDEPENDENT_AMBULATORY_CARE_PROVIDER_SITE_OTHER): Admitting: General Surgery

## 2011-12-14 VITALS — BP 120/72 | HR 87 | Temp 97.7°F | Ht 63.0 in | Wt 227.2 lb

## 2011-12-14 DIAGNOSIS — C50419 Malignant neoplasm of upper-outer quadrant of unspecified female breast: Secondary | ICD-10-CM

## 2011-12-14 NOTE — Progress Notes (Signed)
History: The patient returns for a drain and wound check following left breast lumpectomy and axillary dissection. She reports no particular problems. She has about 5 cc of serous drainage over the last 12 hours.  Exam: Her wound is healing well. I removed the drain.  Assessment plan: Doing well postoperatively without complication. We gave her instructions on range of motion exercises. I will see her back in 4 weeks.

## 2011-12-18 ENCOUNTER — Telehealth (INDEPENDENT_AMBULATORY_CARE_PROVIDER_SITE_OTHER): Payer: Self-pay | Admitting: General Surgery

## 2011-12-18 NOTE — Telephone Encounter (Signed)
Pt calling to inquire about swelling.  She just had a drain removed and area is now swollen.  Explained her body will reabsorb the fluid that is collecting and causing the swelling.  She can wear a bra or use ice pack for comfort.  Call back if not improving by next week.

## 2011-12-26 ENCOUNTER — Other Ambulatory Visit: Payer: Self-pay | Admitting: Emergency Medicine

## 2011-12-26 DIAGNOSIS — C50419 Malignant neoplasm of upper-outer quadrant of unspecified female breast: Secondary | ICD-10-CM

## 2011-12-27 ENCOUNTER — Encounter: Payer: Self-pay | Admitting: Physician Assistant

## 2011-12-27 ENCOUNTER — Other Ambulatory Visit (HOSPITAL_BASED_OUTPATIENT_CLINIC_OR_DEPARTMENT_OTHER): Admitting: Lab

## 2011-12-27 ENCOUNTER — Telehealth: Payer: Self-pay | Admitting: Oncology

## 2011-12-27 ENCOUNTER — Ambulatory Visit (HOSPITAL_BASED_OUTPATIENT_CLINIC_OR_DEPARTMENT_OTHER): Admitting: Physician Assistant

## 2011-12-27 VITALS — BP 130/81 | HR 88 | Temp 98.1°F | Ht 63.0 in | Wt 222.1 lb

## 2011-12-27 DIAGNOSIS — C50419 Malignant neoplasm of upper-outer quadrant of unspecified female breast: Secondary | ICD-10-CM

## 2011-12-27 LAB — CBC WITH DIFFERENTIAL/PLATELET
BASO%: 0.7 % (ref 0.0–2.0)
Basophils Absolute: 0 10*3/uL (ref 0.0–0.1)
EOS%: 3.1 % (ref 0.0–7.0)
Eosinophils Absolute: 0.1 10*3/uL (ref 0.0–0.5)
HCT: 31.2 % — ABNORMAL LOW (ref 34.8–46.6)
HGB: 10 g/dL — ABNORMAL LOW (ref 11.6–15.9)
LYMPH%: 40.6 % (ref 14.0–49.7)
MCH: 28.2 pg (ref 25.1–34.0)
MCHC: 32.1 g/dL (ref 31.5–36.0)
MCV: 87.9 fL (ref 79.5–101.0)
MONO#: 0.5 10*3/uL (ref 0.1–0.9)
MONO%: 16.5 % — ABNORMAL HIGH (ref 0.0–14.0)
NEUT#: 1.1 10*3/uL — ABNORMAL LOW (ref 1.5–6.5)
NEUT%: 39.1 % (ref 38.4–76.8)
Platelets: 178 10*3/uL (ref 145–400)
RBC: 3.55 10*6/uL — ABNORMAL LOW (ref 3.70–5.45)
RDW: 17 % — ABNORMAL HIGH (ref 11.2–14.5)
WBC: 2.9 10*3/uL — ABNORMAL LOW (ref 3.9–10.3)
lymph#: 1.2 10*3/uL (ref 0.9–3.3)

## 2011-12-27 NOTE — Patient Instructions (Addendum)
1. Increase your gabapentin from 100mg  to 300mg  capsules, 1 three times per day.  2. We are going to refer you to Physical Therapy [facility]) for help with range-of-motion of the left upper extremity, and from deconditioning due to chemotherapy.

## 2011-12-27 NOTE — Progress Notes (Signed)
Hematology and Oncology Follow Up Visit  Kelsey Wilson [id number] Aug 08, 1960 51 y.o. 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 Completion 4/4 planned neoadjuvant dose dense FEC, after delay of cycle 4 due to herpes zoster of the left buttock and left upper thigh region. Neulasta support planned on and in in day 2. Excellent radiographic response per MRI obtained on 09/05/2011. Completion 4 cycles of neoadjuvant dose-dense Taxotere, POD 29 from left lumpectomy with axillary node dissection revealing ypTx, ypNx residual disease.  2. Herpes zoster of the left buttock and left upper thigh region on Valtrex 500 mg by mouth 3 times a day, the patient discontinued.  3. Symptoms suggesting postherpetic neuralgia, gabapentin 100 mg by mouth 3 times a day, improvement.   Interim History:   Kelsey Wilson is seen today with her mother in accompaniment for between scheduled office visit appointment due to persistent pain of her fingertips and toes along with neuropathy, despite use of gabapentin 100mg  three times per day, tramadol 50mg  every 6-8 hours PRN, and occasional Vicodin.  Her nails have come off on her finger tips only, toenails are "dark".  She denies fevers.  Review of Systems: Constitutional:  Improved. Eyes: No complaints ENT: No complaints Cardiovascular: no chest pain or dyspnea on exertion Respiratory: no cough, shortness of breath, or wheezing Neurological: no TIA or stroke symptoms Dermatological: negative Gastrointestinal: no abdominal pain, change in bowel habits, or black or bloody stools Genito-Urinary: no dysuria, trouble voiding, or hematuria Hematological and Lymphatic: negative Breast: known L breast cancer Musculoskeletal: Pain on the left upper thigh region associated with known herpes zoster, improved with  gabapentin. Remaining ROS negative.   Medications:   I have reviewed the patient's current medications.  Current Outpatient Prescriptions  Medication Sig Dispense Refill  . acetaminophen (TYLENOL) 500 MG tablet Take 500 mg by mouth every 6 (six) hours as needed. For pain.      Marland Kitchen gabapentin (NEURONTIN) 300 MG capsule Take 300-600 mg by mouth 3 (three) times daily.      Marland Kitchen HYDROcodone-acetaminophen (VICODIN) 5-500 MG per tablet Take 1-2 tablets by mouth every 6 (six) hours as needed. For pain      . LORazepam (ATIVAN) 0.5 MG tablet Take 1 tablet (0.5 mg total) by mouth every 6 (six) hours as needed. For anxiety.  30 tablet  0  . traMADol (ULTRAM) 50 MG tablet Take 50-100 mg by mouth every 6 (six) hours as needed. For pain      . lidocaine-prilocaine (EMLA) cream Apply 1 application topically daily as needed. Use 1 to 2 hours prior to treatment.      Marland Kitchen loratadine (CLARITIN) 10 MG tablet Take 10 mg by mouth daily as needed. "For adverse reaction to shots"      . ondansetron (ZOFRAN) 8 MG tablet Take 8 mg by mouth every 12 (twelve) hours as needed. For nausea and vomiting.      . tobramycin-dexamethasone (TOBRADEX) ophthalmic solution Place 1 drop into both eyes every 4 (four) hours while awake.        Allergies:  Allergies  Allergen Reactions  . Compazine Other (See Comments)    Numbness of face and lips  . Aleve (Naproxen) Nausea Only  . Penicillins Nausea Only    Physical Exam: Filed Vitals:   12/27/11 1429  BP: 130/81  Pulse: 88  Temp:  98.1 F (36.7 C)  Weight: 222 lbs. Musculoskeletal: Decreased ROM left upper extremity. Extremities: Evidence of nail loss on all fingertips.  No evidence of erythema or exudate at this time.    Lab Results: Lab Results  Component Value Date   WBC 2.9* 12/27/2011   HGB 10.0* 12/27/2011   HCT 31.2* 12/27/2011   MCV 87.9 12/27/2011   PLT 178 12/27/2011   NEUTROABS 1.1* 12/27/2011     Chemistry      Component Value Date/Time   NA 140  11/26/2011 1112   K 4.0 11/26/2011 1112   CL 104 11/26/2011 1112   CO2 27 11/26/2011 1112   BUN 10 11/26/2011 1112   CREATININE 0.64 11/26/2011 1112      Component Value Date/Time   CALCIUM 9.2 11/26/2011 1112   ALKPHOS 60 11/08/2011 1138   AST 17 11/08/2011 1138   ALT 9 11/08/2011 1138   BILITOT 1.1 11/08/2011 1138     Pathology: Patient Name: MILIANNA, Kelsey Wilson Accession #: [id number] DOB: [birth year] Age: 35 Gender: F Client Name [facility] Collected Date: 11/28/2011 Received Date: 11/28/2011 Physician: Glenna Fellows Chart #: MRN # : [id number] Physician cc: Kaylyn Lim, RN Christiana Pellant, MD Race: B Visit #: [id number] REPORT OF SURGICAL PATHOLOGY FINAL DIAGNOSIS Diagnosis 1. Breast, lumpectomy, Left - NO RESIDUAL IN SITU OR INVASIVE CARCINOMA PRESENT, SEE COMMENT. - RESECTION MARGINS, NEGATIVE FOR ATYPIA OR MALIGNANCY. 2. Lymph nodes, regional resection, Left axillary - SEVENTEEN LYMPH NODES, NEGATIVE FOR TUMOR (0/17), SEE COMMENT. Microscopic Comment 1. , 2. The entire 1.8 cm area associated with the localization wire was submitted for review. Multiple tissue sections demonstrate parenchymal fibrosis with prominent foamy histiocytic infiltration associated with acute and chronic inflammation and hemosiderin deposition. The previous biopsy site was identified. There is no in situ or invasive malignancy identified in any of the tissue sections. The lymph nodes demonstrate neoadjuvant related change including near total replacement of two lymph nodes by foamy histiocytes. No intranodal metastatic epithelium is identified. Pathologic Stage is ypTX pNX, pMX(CR:mw 11-29-11) [place] RUND DO Pathologist, Electronic Signature (Case signed 11/29/2011)  Assessment:  Ms. 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 Completion 4/4  planned neoadjuvant dose dense FEC, after delay of cycle 4 due to herpes zoster of the left buttock and left upper thigh region. Neulasta support planned on and in in day 2. Excellent radiographic response per MRI obtained on 09/05/2011. Completion 4 cycles of neoadjuvant dose-dense Taxotere, POD 29 from left lumpectomy with axillary node dissection revealing ypTx, ypNx residual disease.  2. Herpes zoster of the left buttock and left upper thigh region on Valtrex 500 mg by mouth 3 times a day, the patient discontinued.  3. Symptoms suggesting postherpetic neuralgia, gabapentin 100 mg by mouth 3 times a day, improvement.   Case reviewed with Dr. Pierce Crane.  Plan:  Nanda will increase her gabapentin to 300mg  three times per day, and resume vitamin B complex daily.  I will refer her to Physical Therapy for ROM of th e left upper extremity, and reconditioning.  She will be seen by Dr. Donnie Coffin on 01/11/12 for follow up, but she knows to call with any changes or problems.   Chamar Broughton T, PA-C 12/27/11

## 2011-12-27 NOTE — Telephone Encounter (Signed)
gve the pt her aug 2013 appt calendar along. Pt is aware that she will called with the physical therapy appt. S/w nancy from the lymphedema clinic appt

## 2012-01-07 ENCOUNTER — Ambulatory Visit: Admitting: Physical Therapy

## 2012-01-09 ENCOUNTER — Ambulatory Visit: Attending: Physician Assistant | Admitting: Physical Therapy

## 2012-01-09 ENCOUNTER — Ambulatory Visit (INDEPENDENT_AMBULATORY_CARE_PROVIDER_SITE_OTHER): Admitting: General Surgery

## 2012-01-09 ENCOUNTER — Encounter (INDEPENDENT_AMBULATORY_CARE_PROVIDER_SITE_OTHER): Payer: Self-pay | Admitting: General Surgery

## 2012-01-09 VITALS — BP 102/70 | HR 76 | Temp 96.8°F | Resp 16 | Ht 63.0 in | Wt 220.6 lb

## 2012-01-09 DIAGNOSIS — IMO0001 Reserved for inherently not codable concepts without codable children: Secondary | ICD-10-CM | POA: Insufficient documentation

## 2012-01-09 DIAGNOSIS — M25519 Pain in unspecified shoulder: Secondary | ICD-10-CM | POA: Insufficient documentation

## 2012-01-09 DIAGNOSIS — C50419 Malignant neoplasm of upper-outer quadrant of unspecified female breast: Secondary | ICD-10-CM

## 2012-01-09 DIAGNOSIS — M24519 Contracture, unspecified shoulder: Secondary | ICD-10-CM | POA: Insufficient documentation

## 2012-01-09 DIAGNOSIS — C50919 Malignant neoplasm of unspecified site of unspecified female breast: Secondary | ICD-10-CM | POA: Insufficient documentation

## 2012-01-09 DIAGNOSIS — I890 Lymphedema, not elsewhere classified: Secondary | ICD-10-CM | POA: Insufficient documentation

## 2012-01-09 NOTE — Progress Notes (Signed)
Chief complaint: Followup breast cancer  History: Patient returns now 6 weeks following left axillary dissection and lumpectomy following neoadjuvant treatment for T3 N1 carcinoma of the left breast. There was no residual disease on final pathology. She has some feeling of heaviness in her breast particularly in the morning and some occasional brief discomfort around the incision but no major complaints. She is to be followed up in the cancer center next week and radiation planning to begin  Exam: Her incision in the left axilla is well-healed. No fluid collections or thickening or other abnormalities.  Assessment and plan: Doing well following surgery. She has some expected discomfort. She has followup arranged in physical therapy and at the cancer center. As long as she is doing well I'll see her back in 4 months

## 2012-01-11 ENCOUNTER — Telehealth: Payer: Self-pay | Admitting: Oncology

## 2012-01-11 ENCOUNTER — Other Ambulatory Visit (HOSPITAL_BASED_OUTPATIENT_CLINIC_OR_DEPARTMENT_OTHER): Admitting: Lab

## 2012-01-11 ENCOUNTER — Encounter: Payer: Self-pay | Admitting: Family

## 2012-01-11 ENCOUNTER — Ambulatory Visit (HOSPITAL_BASED_OUTPATIENT_CLINIC_OR_DEPARTMENT_OTHER): Admitting: Family

## 2012-01-11 VITALS — BP 138/87 | HR 83 | Temp 98.0°F | Resp 20 | Ht 63.0 in | Wt 218.3 lb

## 2012-01-11 DIAGNOSIS — C50419 Malignant neoplasm of upper-outer quadrant of unspecified female breast: Secondary | ICD-10-CM

## 2012-01-11 DIAGNOSIS — B0229 Other postherpetic nervous system involvement: Secondary | ICD-10-CM

## 2012-01-11 DIAGNOSIS — G629 Polyneuropathy, unspecified: Secondary | ICD-10-CM

## 2012-01-11 DIAGNOSIS — C50919 Malignant neoplasm of unspecified site of unspecified female breast: Secondary | ICD-10-CM

## 2012-01-11 DIAGNOSIS — D709 Neutropenia, unspecified: Secondary | ICD-10-CM

## 2012-01-11 DIAGNOSIS — Z170 Estrogen receptor positive status [ER+]: Secondary | ICD-10-CM

## 2012-01-11 LAB — CBC WITH DIFFERENTIAL/PLATELET
BASO%: 0.4 % (ref 0.0–2.0)
Basophils Absolute: 0 10*3/uL (ref 0.0–0.1)
EOS%: 2.7 % (ref 0.0–7.0)
Eosinophils Absolute: 0.1 10*3/uL (ref 0.0–0.5)
HCT: 31.9 % — ABNORMAL LOW (ref 34.8–46.6)
HGB: 10.4 g/dL — ABNORMAL LOW (ref 11.6–15.9)
LYMPH%: 57.8 % — ABNORMAL HIGH (ref 14.0–49.7)
MCH: 27.1 pg (ref 25.1–34.0)
MCHC: 32.6 g/dL (ref 31.5–36.0)
MCV: 83.1 fL (ref 79.5–101.0)
MONO#: 0.4 10*3/uL (ref 0.1–0.9)
MONO%: 13.7 % (ref 0.0–14.0)
NEUT#: 0.7 10*3/uL — ABNORMAL LOW (ref 1.5–6.5)
NEUT%: 25.4 % — ABNORMAL LOW (ref 38.4–76.8)
Platelets: 154 10*3/uL (ref 145–400)
RBC: 3.84 10*6/uL (ref 3.70–5.45)
RDW: 15.5 % — ABNORMAL HIGH (ref 11.2–14.5)
WBC: 2.6 10*3/uL — ABNORMAL LOW (ref 3.9–10.3)
lymph#: 1.5 10*3/uL (ref 0.9–3.3)

## 2012-01-11 NOTE — Progress Notes (Signed)
Per NR, Rx for a walking cane given to pt along with an address for advanced home care

## 2012-01-11 NOTE — Patient Instructions (Addendum)
Appt with Dr. Michell Heinrich Return in 1 month with me with lab prior.

## 2012-01-11 NOTE — Telephone Encounter (Signed)
gve the pt her sept appt calendar along with the appt to see dr Michell Heinrich in rad onc for aug

## 2012-01-11 NOTE — Progress Notes (Signed)
Hematology and Oncology Follow Up Visit  AZAELA CARACCI [id number] 15-May-1961 51 y.o. 12/27/11   HPI: Ms. 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 Completion 4/4 planned neoadjuvant dose dense FEC, after delay of cycle 4 due to herpes zoster of the left buttock and left upper thigh region. Neulasta support planned on and in in day 2. Excellent radiographic response per MRI obtained on 09/05/2011. Completion 4 cycles of neoadjuvant dose-dense Taxotere, POD 29 from left lumpectomy with axillary node dissection revealing ypTx, ypNx residual disease.  2. History herpes zoster of the left buttock and left upper thigh region.   3. Symptoms suggesting postherpetic neuralgia, gabapentin 100 mg by mouth 3 times a day, improvement.   Interim History:   Alizaya is seen today with her mother in accompaniment for between scheduled office visit appointment due to neuropathy, fingertips and toes, slight improvement with gabapentin 100 mg three times per day, tramadol 50 mg every 6-8 hours PRN, and occasional Vicodin.  Her nails have come off on her finger tips only, toenails are "dark".  She denies fevers.  No headache or blurred vision. No cough or shortness of breath. No abdominal pain or new bone pain. Bowel and bladder function are normal. Appetite is good, with adequate fluid intake. Reports falling last week "lost my balance" and landed on right knee.  Remainder of the 10 point  review of systems is negative.  Healed well from surgery, she will follow-up with surgeon in 4 months per his note.   Blood counts have been slow to recover since completing chemo.    Medications:   I have reviewed the patient's current medications.  Allergies:  Allergies  Allergen Reactions  . Compazine Other (See Comments)    Numbness of face and lips  . Aleve (Naproxen)  Nausea Only  . Penicillins Nausea Only    Physical Exam: Filed Vitals:   01/11/12 1114  BP: 138/87  Pulse: 83  Temp: 98 F (36.7 C)  Resp: 20  Weight: 222 lbs. General: Well developed, well nourished, in no acute distress.  Musculoskeletal: Decreased ROM left upper extremity. Extremities: Evidence of nail loss on all fingertips.  No evidence of erythema or exudate at this time.  EENT: No ocular or oral lesions. No stomatitis.  Respiratory: Lungs are clear to auscultation bilaterally with normal respiratory movement and no accessory muscle use. Cardiac: No murmur, rub or tachycardia. No upper or lower extremity edema.  GI: Abdomen is soft, no palpable hepatosplenomegaly. No fluid wave. No tenderness. Lymph: No cervical, infraclavicular, axillary or inguinal adenopathy. Neuro: No focal neurological deficits. Psych: Alert and oriented X 3, appropriate mood and affect.  BREAST EXAM: In the supine position, with the right arm over the head, the right nipple is everted. No periareolar edema or nipple discharge. No mass in any quadrant or subareolar region. No redness of the skin. No right axillary adenopathy. With the left arm over the head, the left nipple is everted. No periareolar edema or nipple discharge. No mass in any quadrant or subareolar region. No redness of the skin. No left axillary adenopathy. Left axillary well-healed incision.   Lab Results: Lab Results  Component Value Date   WBC 2.6* 01/11/2012   HGB 10.4* 01/11/2012   HCT 31.9* 01/11/2012   MCV 83.1 01/11/2012   PLT 154 01/11/2012   NEUTROABS 0.7* 01/11/2012  Chemistry      Component Value Date/Time   NA 140 11/26/2011 1112   K 4.0 11/26/2011 1112   CL 104 11/26/2011 1112   CO2 27 11/26/2011 1112   BUN 10 11/26/2011 1112   CREATININE 0.64 11/26/2011 1112      Component Value Date/Time   CALCIUM 9.2 11/26/2011 1112   ALKPHOS 60 11/08/2011 1138   AST 17 11/08/2011 1138   ALT 9 11/08/2011 1138   BILITOT 1.1 11/08/2011 1138       Assessment:  Ms. Gouger is a 51 year old [place] woman with a locally advanced clinical T3 N1 left breast carcinoma, completion 4 cycles of neoadjuvant dose-dense Taxotere,  left lumpectomy with axillary node dissection revealing ypTx, ypNx residual disease.  2. Herpes zoster of the left buttock and left upper thigh region, resolved.   3. Postherpetic neuralgia, gabapentin 100 mg by mouth 3 times a day, improvement.  4. Persistent neutropenia, slow to recover after completion of chemo. Last chemo May 2013.   Plan: 1. Monthly CBC until counts recover.  2. Return in 1 month to see me with lab.  3. Refer to Dr. Michell Heinrich to begin radiation. 4. Will need Tamoxifen after radiation therapy.    Case reviewed with Dr. Pierce Crane.  Plan:  1. Prescription for cane for stability Colman Cater, FNP-C 12/27/11

## 2012-01-14 ENCOUNTER — Ambulatory Visit

## 2012-01-16 ENCOUNTER — Encounter

## 2012-01-17 ENCOUNTER — Encounter: Payer: Self-pay | Admitting: Radiation Oncology

## 2012-01-17 ENCOUNTER — Other Ambulatory Visit: Payer: Self-pay | Admitting: Physician Assistant

## 2012-01-17 ENCOUNTER — Ambulatory Visit
Admission: RE | Admit: 2012-01-17 | Discharge: 2012-01-17 | Disposition: A | Discharge status: Home / Self Care | Source: Ambulatory Visit | Attending: Radiation Oncology | Admitting: Radiation Oncology

## 2012-01-17 VITALS — BP 146/91 | HR 76 | Temp 98.1°F | Resp 18 | Ht 63.0 in | Wt 217.8 lb

## 2012-01-17 DIAGNOSIS — M2550 Pain in unspecified joint: Secondary | ICD-10-CM | POA: Insufficient documentation

## 2012-01-17 DIAGNOSIS — C50419 Malignant neoplasm of upper-outer quadrant of unspecified female breast: Secondary | ICD-10-CM

## 2012-01-17 DIAGNOSIS — Z79899 Other long term (current) drug therapy: Secondary | ICD-10-CM | POA: Insufficient documentation

## 2012-01-17 DIAGNOSIS — IMO0001 Reserved for inherently not codable concepts without codable children: Secondary | ICD-10-CM | POA: Insufficient documentation

## 2012-01-17 DIAGNOSIS — Y842 Radiological procedure and radiotherapy as the cause of abnormal reaction of the patient, or of later complication, without mention of misadventure at the time of the procedure: Secondary | ICD-10-CM | POA: Insufficient documentation

## 2012-01-17 DIAGNOSIS — Z510 Encounter for antineoplastic radiation therapy: Secondary | ICD-10-CM | POA: Insufficient documentation

## 2012-01-17 DIAGNOSIS — R51 Headache: Secondary | ICD-10-CM | POA: Insufficient documentation

## 2012-01-17 DIAGNOSIS — L988 Other specified disorders of the skin and subcutaneous tissue: Secondary | ICD-10-CM | POA: Insufficient documentation

## 2012-01-17 NOTE — Progress Notes (Signed)
Patient presents to the clinic today accompanied by her mother for a consultation with Dr. Michell Heinrich to discuss the role of radiation therapy in the treatment of left breast ca. Patient is alert and oriented to person, place, and time. No distress noted. Slow steady gait noted with assistance of cane. Pleasant affect noted. Patient reports bilateral shoulder, right knee and right ankle pain 7 on a scale of 0-10. Patient reports that she goes to physical therapy twice per week but, just started Monday for her shoulders. Patient reports numbness and tingling in her fingers and toes related to the effects of chemo for which she take neurontin. Also, patient reports taking vicodin and tramadol for pain. Patient requesting a refill on her ambien. Patient reports she fell two weeks ago. Patient denies breast pain. Patient reports the incisions on her left breast from surgery are well approximated without redness, drainage or edema. Reported all findings to Dr. Michell Heinrich.

## 2012-01-17 NOTE — Progress Notes (Signed)
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  I originally saw her in January. She underwent neoadjuvant chemotherapy with FEC. Her chemotherapy was complicated by neutropenia, shingles, and neuropathy. She did have a pathologic complete response however on her lumpectomy and lymph node dissection on 11/28/2011. She had no residual carcinoma and 0/17 lymph nodes were positive. She unfortunately has continued to struggle with recovering from her surgery. She is lifting her has trouble lifting her bilateral arms above her shoulders. She's been referred to physical therapy but is only working with him about twice a week. She is on disability and is not working secondary to this. Her most recent labs show that she is neutropenic still. Her last blood count on 01/11/2012 was 2.6. She has seen Dr. Donnie Coffin and he is recommended tamoxifen at the end of radiation because her tumor was ER positive at 80%. She doesn't complain of any lymphedema. She does state that she's been having some soreness in her right knee since she fell.  Allergies:  Allergies  Allergen Reactions  . Compazine Other (See Comments)    Numbness of face and lips  . Aleve (Naproxen) Nausea Only  . Penicillins Nausea Only    Medications:  Current Outpatient Prescriptions  Medication Sig Dispense Refill  . acetaminophen (TYLENOL) 500 MG tablet Take 500 mg by mouth every 6 (six) hours as needed. For pain.      Marland Kitchen gabapentin (NEURONTIN) 300 MG capsule Take 300-600 mg by mouth 3 (three) times daily.      Marland Kitchen HYDROcodone-acetaminophen (VICODIN) 5-500 MG per tablet Take 1-2 tablets by mouth every 6 (six) hours as needed. For pain      . lidocaine-prilocaine (EMLA) cream  Apply 1 application topically daily as needed. Use 1 to 2 hours prior to treatment.      . traMADol (ULTRAM) 50 MG tablet TAKE 1-2 TABLETS BY MOUTH EVERY 6-8 HOURS AS NEEDED FOR PAIN  90 tablet  0  . loratadine (CLARITIN) 10 MG tablet Take 10 mg by mouth daily as needed. "For adverse reaction to shots"      . LORazepam (ATIVAN) 0.5 MG tablet Take 1 tablet (0.5 mg total) by mouth every 6 (six) hours as needed. For anxiety.  30 tablet  0  . ondansetron (ZOFRAN) 8 MG tablet Take 8 mg by mouth every 12 (twelve) hours as needed. For nausea and vomiting.      . tobramycin-dexamethasone (TOBRADEX) ophthalmic solution Place 1 drop into both eyes every 4 (four) hours while awake.        Physical Exam:   height is 5\' 3"  (1.6 m) and weight is 217 lb 12.8 oz (98.793 kg). Her oral temperature is 98.1 F (36.7 C). Her blood pressure is 146/91 and her pulse is 76. Her respiration is 18 and oxygen saturation is 100%.  She is a pleasant female in no distress sitting comfortably examining table. She is alert and a x3. She has a healing left breast incision.  IMPRESSION: Kelsey Wilson is a 51 y.o. female who had a pathologic complete response to chemotherapy  PLAN:  Kelsey Wilson still is struggling with arms pain and weakness. I'd like her to undergo physical therapy for a  few more weeks before proceeding on with treatment. At this point she can get her arms and in the treatment position. I've made a followup appointment with her on August 29 with a simulation. Hopefully by this point to be of the move her arms a little but better. We discussed disability and I am hopeful that she will only need about 3 months off to complete her radiation recover. This is of course if her neurologic deficits improved. I asked her to contact her primary care physician regarding her knee pain. She signed informed consent and agree to proceed forward after we discussed the possible risks and benefits of treatment including but not limited to  improve local control, damage to critical normal structures including heart and lungs.    Lurline Hare, MD

## 2012-01-17 NOTE — Progress Notes (Signed)
51 year old female. Three children. Postmenopausal.  Locally advanced T3N1 left breast carcinoma ER/PR positive and HER 2 negative. Completed 4/4 planned neoadjuvant dose dense FEC. Cycle 4 was delayed due to herpes zoster of the left buttock and left upper thigh region. Left breast lumpectomy and axillary lymph node dissection done by Dr. Johna Sheriff 11/29/2011. Physical therapy has been arranged by Dr. Johna Sheriff and patient to follow up with him in four months.  Patient seen by Dr. Michell Heinrich in the [facility] on 06/14/2011.  AX: compazine and penicillin No hx of radiation in the past No indication of a pacemaker

## 2012-01-17 NOTE — Progress Notes (Signed)
See progress note under physician encounter. 

## 2012-01-18 ENCOUNTER — Ambulatory Visit: Admitting: Physical Therapy

## 2012-01-18 NOTE — Addendum Note (Signed)
Encounter addended by: Agnes Lawrence, RN on: 01/18/2012 12:21 PM<BR>     Documentation filed: Inpatient Document Flowsheet

## 2012-01-18 NOTE — Addendum Note (Signed)
Encounter addended by: Agnes Lawrence, RN on: 01/18/2012 12:19 PM<BR>     Documentation filed: Inpatient Patient Education, Charges VN

## 2012-01-22 ENCOUNTER — Telehealth: Payer: Self-pay | Admitting: Radiation Oncology

## 2012-01-22 NOTE — Telephone Encounter (Signed)
Returned patient's call. Patient inquiring about upcoming [facility] appointment. All questions answered. Patient requested refill on Ativan script. Will send an in basket request to Dr. Michell Heinrich. Will call patient with outcome reference script. Encouraged to call with future needs. Patient verbalized understanding.

## 2012-01-22 NOTE — Telephone Encounter (Signed)
Phoned patient as ordered by Dr. Michell Heinrich. Instructed patient to phone her pharmacy and have them send a request for refill to Dr. Renelda Loma office. Patient verbalized understanding.

## 2012-01-22 NOTE — Telephone Encounter (Signed)
Message copied by Agnes Lawrence on Tue Jan 22, 2012  4:21 PM ------      Message from: Lurline Hare      Created: Tue Jan 22, 2012  3:41 PM      Regarding: RE: Ativan Script      Contact: [phone]       I would tell her to ask her pharmacy to fax a refill request to Dr. Donnie Coffin.             For any patient that is not currently undertreatment who calls for refill request, I would tell them the same.             SW      ----- Message -----         From: Agnes Lawrence, RN         Sent: 01/22/2012  10:45 AM           To: Lurline Hare, MD      Subject: Ativan Script                                            Dr. Michell Heinrich.            Kelsey Wilson phoned today requesting Ativan refill. She has not been treated yet. She is scheduled for [facility] on 8/29. Donnie Coffin previously filled her Ativan but, she doesn't see him for another month. Please let me know if you plan to refill this or if I should refer her back to [place]. I promised to call her this afternoon with a response. Thanks.             Sam

## 2012-01-23 ENCOUNTER — Ambulatory Visit

## 2012-01-24 ENCOUNTER — Encounter: Admitting: Physical Therapy

## 2012-01-25 ENCOUNTER — Ambulatory Visit: Admitting: Physical Therapy

## 2012-01-28 ENCOUNTER — Ambulatory Visit: Admitting: Physical Therapy

## 2012-01-30 ENCOUNTER — Ambulatory Visit: Admitting: Physical Therapy

## 2012-01-31 ENCOUNTER — Ambulatory Visit
Admission: RE | Admit: 2012-01-31 | Discharge: 2012-01-31 | Disposition: A | Discharge status: Home / Self Care | Source: Ambulatory Visit | Attending: Radiation Oncology | Admitting: Radiation Oncology

## 2012-01-31 DIAGNOSIS — C50419 Malignant neoplasm of upper-outer quadrant of unspecified female breast: Secondary | ICD-10-CM

## 2012-01-31 NOTE — Progress Notes (Signed)
Name: LUIS SAMI   MRN: [id number]  Date:  01/31/2012  DOB: [birth year]  Status:outpatient    DIAGNOSIS: Breast cancer.  CONSENT VERIFIED: yes   SET UP: Patient is setup supine   IMMOBILIZATION:  The following immobilization was used:Custom Moldable Pillow, breast board.   NARRATIVE: Ms. Mikels was brought to the CT Simulation planning suite.  Identity was confirmed.  All relevant records and images related to the planned course of therapy were reviewed.  Then, the patient was positioned in a stable reproducible clinical set-up for radiation therapy.  Wires were placed to delineate the clinical extent of breast tissue. A wire was placed on the scar as well.  CT images were obtained.  An isocenter was placed. Skin markings were placed.  The CT images were loaded into the planning software where the target and avoidance structures were contoured.  The radiation prescription was entered and confirmed. The patient was discharged in stable condition and tolerated simulation well.   An attempt at breath hold was unsuccessful.  Despite several attempts at coaching, she could not reliably remain in deep inspiration breath hold so we aborted that portion of her treatment and proceeded with her freebreathing scan.   TREATMENT PLANNING NOTE:  Treatment planning then occurred. I have requested : MLC's, isodose plan, basic dose calculation  3D simulation is requested.  I request DVH of the breast, lung and heart.

## 2012-02-06 ENCOUNTER — Telehealth: Payer: Self-pay | Admitting: *Deleted

## 2012-02-06 NOTE — Telephone Encounter (Signed)
Patient confirmed over the phone same date different time on 02-08-2012

## 2012-02-07 ENCOUNTER — Other Ambulatory Visit: Payer: Self-pay | Admitting: Family

## 2012-02-07 ENCOUNTER — Ambulatory Visit: Attending: Physician Assistant

## 2012-02-07 DIAGNOSIS — M25519 Pain in unspecified shoulder: Secondary | ICD-10-CM | POA: Insufficient documentation

## 2012-02-07 DIAGNOSIS — C50419 Malignant neoplasm of upper-outer quadrant of unspecified female breast: Secondary | ICD-10-CM

## 2012-02-07 DIAGNOSIS — C50919 Malignant neoplasm of unspecified site of unspecified female breast: Secondary | ICD-10-CM | POA: Insufficient documentation

## 2012-02-07 DIAGNOSIS — IMO0001 Reserved for inherently not codable concepts without codable children: Secondary | ICD-10-CM | POA: Insufficient documentation

## 2012-02-07 DIAGNOSIS — I890 Lymphedema, not elsewhere classified: Secondary | ICD-10-CM | POA: Insufficient documentation

## 2012-02-07 DIAGNOSIS — M24519 Contracture, unspecified shoulder: Secondary | ICD-10-CM | POA: Insufficient documentation

## 2012-02-08 ENCOUNTER — Other Ambulatory Visit: Admitting: Lab

## 2012-02-08 ENCOUNTER — Ambulatory Visit: Admitting: Family

## 2012-02-08 ENCOUNTER — Encounter: Payer: Self-pay | Admitting: Family

## 2012-02-08 ENCOUNTER — Telehealth: Payer: Self-pay | Admitting: Oncology

## 2012-02-08 ENCOUNTER — Other Ambulatory Visit (HOSPITAL_BASED_OUTPATIENT_CLINIC_OR_DEPARTMENT_OTHER): Admitting: Lab

## 2012-02-08 ENCOUNTER — Ambulatory Visit (HOSPITAL_BASED_OUTPATIENT_CLINIC_OR_DEPARTMENT_OTHER): Admitting: Family

## 2012-02-08 ENCOUNTER — Encounter: Payer: Self-pay | Admitting: Radiation Oncology

## 2012-02-08 ENCOUNTER — Ambulatory Visit
Admission: RE | Admit: 2012-02-08 | Discharge: 2012-02-08 | Disposition: A | Discharge status: Home / Self Care | Source: Ambulatory Visit | Attending: Radiation Oncology | Admitting: Radiation Oncology

## 2012-02-08 VITALS — BP 135/84 | HR 65 | Temp 98.1°F | Resp 20 | Ht 63.0 in | Wt 216.1 lb

## 2012-02-08 DIAGNOSIS — C50419 Malignant neoplasm of upper-outer quadrant of unspecified female breast: Secondary | ICD-10-CM

## 2012-02-08 DIAGNOSIS — Z170 Estrogen receptor positive status [ER+]: Secondary | ICD-10-CM

## 2012-02-08 DIAGNOSIS — C50919 Malignant neoplasm of unspecified site of unspecified female breast: Secondary | ICD-10-CM

## 2012-02-08 DIAGNOSIS — IMO0002 Reserved for concepts with insufficient information to code with codable children: Secondary | ICD-10-CM

## 2012-02-08 LAB — CBC WITH DIFFERENTIAL/PLATELET
BASO%: 0.3 % (ref 0.0–2.0)
Basophils Absolute: 0 10*3/uL (ref 0.0–0.1)
EOS%: 3.3 % (ref 0.0–7.0)
Eosinophils Absolute: 0.1 10*3/uL (ref 0.0–0.5)
HCT: 36.3 % (ref 34.8–46.6)
HGB: 12 g/dL (ref 11.6–15.9)
LYMPH%: 49.7 % (ref 14.0–49.7)
MCH: 27 pg (ref 25.1–34.0)
MCHC: 33.1 g/dL (ref 31.5–36.0)
MCV: 81.6 fL (ref 79.5–101.0)
MONO#: 0.3 10*3/uL (ref 0.1–0.9)
MONO%: 8.2 % (ref 0.0–14.0)
NEUT#: 1.4 10*3/uL — ABNORMAL LOW (ref 1.5–6.5)
NEUT%: 38.5 % (ref 38.4–76.8)
Platelets: 147 10*3/uL (ref 145–400)
RBC: 4.45 10*6/uL (ref 3.70–5.45)
RDW: 15.1 % — ABNORMAL HIGH (ref 11.2–14.5)
WBC: 3.6 10*3/uL — ABNORMAL LOW (ref 3.9–10.3)
lymph#: 1.8 10*3/uL (ref 0.9–3.3)
nRBC: 0 % (ref 0–0)

## 2012-02-08 MED ORDER — LORAZEPAM 0.5 MG PO TABS: 0.5000 mg | ORAL_TABLET | Freq: Four times a day (QID) | ORAL | Status: DC | PRN

## 2012-02-08 MED ORDER — TRAMADOL HCL 50 MG PO TABS: 50.0000 mg | ORAL_TABLET | Freq: Four times a day (QID) | ORAL | Status: DC | PRN

## 2012-02-08 NOTE — Progress Notes (Signed)
Hematology and Oncology Follow Up Visit  KAZI MONTORO [id number] 1961-05-06 51 y.o. 12/27/11   HPI: Ms. Abid is a 51 year old [place] woman with a locally advanced clinical T3 N1 left breast carcinoma ER/PR positive at 8/9% respectively, HER-2 negative with a Ki-67 of 100%. Completion 4/4 planned neoadjuvant dose dense FEC, after delay of cycle 4 due to herpes zoster of the left buttock and left upper thigh region. Neulasta support planned on and in in day 2. Excellent radiographic response per MRI obtained on 09/05/2011. Completion 4 cycles of neoadjuvant dose-dense Taxotere, POD 29 from left lumpectomy with axillary node dissection revealing ypTx, ypNx residual disease. 2  History herpes zoster of the left buttock and left upper thigh region.  3. Symptoms suggesting postherpetic neuralgia, gabapentin 100 mg by mouth 3 times a day, improvement.  Interim History:   Tahjanae is seen today with her mother in accompaniment for follow-up on lab. Chief complaint is peripheral neuropathy, slight improvement with gabapentin 100 mg three times per day, tramadol 50 mg every 6-8 hours PRN, and occasional Vicodin.  Her nails have come off on her finger tips only, toenails are "dark".  Has transient nausea, no vomiting. She denies fevers.  No headache or blurred vision. No cough or shortness of breath. No abdominal pain or new bone pain. Bowel and bladder function are normal. Appetite is good, with adequate fluid intake. I had prescribed cane on last visit, she believe it has helped with stability. Remainder of the 10 point  review of systems is negative.  Is scheduled to begin radiation therapy Monday.   Medications:   I have reviewed the patient's current medications.  Allergies:  Allergies  Allergen Reactions  . Compazine Other (See Comments)    Numbness of face and lips  . Aleve (Naproxen) Nausea Only  . Penicillins Nausea Only    Physical Exam: Filed Vitals:   02/08/12  1415  BP: 135/84  Pulse: 65  Temp: 98.1 F (36.7 C)  Resp: 20  Weight: 222 lbs. General: Well developed, well nourished, in no acute distress.  Musculoskeletal: Decreased ROM left upper extremity. Extremities: Evidence of nail loss on all fingertips.  No evidence of erythema or exudate at this time.  EENT: No ocular or oral lesions. No stomatitis.  Respiratory: Lungs are clear to auscultation bilaterally with normal respiratory movement and no accessory muscle use. Cardiac: No murmur, rub or tachycardia. No upper or lower extremity edema.  GI: Abdomen is soft, no palpable hepatosplenomegaly. No fluid wave. No tenderness. Lymph: No cervical, infraclavicular, axillary or inguinal adenopathy. Neuro: No focal neurological deficits. Psych: Alert and oriented X 3, appropriate mood and affect.    Lab Results: Lab Results  Component Value Date   WBC 3.6* 02/08/2012   HGB 12.0 02/08/2012   HCT 36.3 02/08/2012   MCV 81.6 02/08/2012   PLT 147 02/08/2012   NEUTROABS 1.4* 02/08/2012     Assessment:  Ms. Mable is a 51 year old [place] woman with a locally advanced clinical T3 N1 left breast carcinoma, completion 4 cycles of neoadjuvant dose-dense Taxotere,  left lumpectomy with axillary node dissection revealing ypTx, ypNx residual disease.  2. Herpes zoster of the left buttock and left upper thigh region, resolved.   3. Postherpetic neuralgia, gabapentin 100 mg by mouth 3 times a day, improvement.  4. Blood counts have recovered following chemo.    Plan: 1. Begin radiation therapy Monday.  2. Return in 6 weeks to see Dr. Donnie Coffin at the conclusion  of radiation.  3. Will need Tamoxifen after radiation therapy.  4.  Prescription for Tramadol at her request.  5. Prescription for Lorazepam at her request.    Colman Cater, FNP-C

## 2012-02-08 NOTE — Telephone Encounter (Signed)
gve the pt her oct 2013 appt calendars

## 2012-02-11 ENCOUNTER — Encounter (HOSPITAL_COMMUNITY): Payer: Self-pay | Admitting: Emergency Medicine

## 2012-02-11 ENCOUNTER — Ambulatory Visit
Admission: RE | Admit: 2012-02-11 | Discharge: 2012-02-11 | Disposition: A | Discharge status: Home / Self Care | Source: Ambulatory Visit | Attending: Radiation Oncology | Admitting: Radiation Oncology

## 2012-02-11 ENCOUNTER — Emergency Department (HOSPITAL_COMMUNITY)
Admission: EM | Admit: 2012-02-11 | Discharge: 2012-02-11 | Disposition: A | Discharge status: Home / Self Care | Attending: Emergency Medicine | Admitting: Emergency Medicine

## 2012-02-11 DIAGNOSIS — M791 Myalgia, unspecified site: Secondary | ICD-10-CM

## 2012-02-11 DIAGNOSIS — M7989 Other specified soft tissue disorders: Secondary | ICD-10-CM

## 2012-02-11 DIAGNOSIS — R110 Nausea: Secondary | ICD-10-CM | POA: Insufficient documentation

## 2012-02-11 DIAGNOSIS — M79609 Pain in unspecified limb: Secondary | ICD-10-CM | POA: Insufficient documentation

## 2012-02-11 DIAGNOSIS — Z923 Personal history of irradiation: Secondary | ICD-10-CM | POA: Insufficient documentation

## 2012-02-11 DIAGNOSIS — Z9221 Personal history of antineoplastic chemotherapy: Secondary | ICD-10-CM | POA: Insufficient documentation

## 2012-02-11 DIAGNOSIS — Z79899 Other long term (current) drug therapy: Secondary | ICD-10-CM | POA: Insufficient documentation

## 2012-02-11 DIAGNOSIS — C50919 Malignant neoplasm of unspecified site of unspecified female breast: Secondary | ICD-10-CM | POA: Insufficient documentation

## 2012-02-11 LAB — CBC WITH DIFFERENTIAL/PLATELET
Basophils Absolute: 0 10*3/uL (ref 0.0–0.1)
Basophils Relative: 0 % (ref 0–1)
Eosinophils Absolute: 0.2 10*3/uL (ref 0.0–0.7)
Eosinophils Relative: 4 % (ref 0–5)
HCT: 34.7 % — ABNORMAL LOW (ref 36.0–46.0)
Hemoglobin: 11.6 g/dL — ABNORMAL LOW (ref 12.0–15.0)
Lymphocytes Relative: 45 % (ref 12–46)
Lymphs Abs: 1.8 10*3/uL (ref 0.7–4.0)
MCH: 27.3 pg (ref 26.0–34.0)
MCHC: 33.4 g/dL (ref 30.0–36.0)
MCV: 81.6 fL (ref 78.0–100.0)
Monocytes Absolute: 0.6 10*3/uL (ref 0.1–1.0)
Monocytes Relative: 15 % — ABNORMAL HIGH (ref 3–12)
Neutro Abs: 1.5 10*3/uL — ABNORMAL LOW (ref 1.7–7.7)
Neutrophils Relative %: 36 % — ABNORMAL LOW (ref 43–77)
Platelets: 196 10*3/uL (ref 150–400)
RBC: 4.25 MIL/uL (ref 3.87–5.11)
RDW: 15.4 % (ref 11.5–15.5)
WBC: 4 10*3/uL (ref 4.0–10.5)

## 2012-02-11 LAB — BASIC METABOLIC PANEL
BUN: 13 mg/dL (ref 6–23)
CO2: 26 mEq/L (ref 19–32)
Calcium: 9 mg/dL (ref 8.4–10.5)
Chloride: 101 mEq/L (ref 96–112)
Creatinine, Ser: 0.53 mg/dL (ref 0.50–1.10)
GFR calc Af Amer: >90 mL/min (ref >90)
GFR calc non Af Amer: >90 mL/min (ref >90)
Glucose, Bld: 98 mg/dL (ref 70–99)
Potassium: 3.9 mEq/L (ref 3.5–5.1)
Sodium: 135 mEq/L (ref 135–145)

## 2012-02-11 LAB — URINALYSIS, ROUTINE W REFLEX MICROSCOPIC
Bilirubin Urine: NEGATIVE
Glucose, UA: NEGATIVE mg/dL
Hgb urine dipstick: NEGATIVE
Ketones, ur: NEGATIVE mg/dL
Leukocytes, UA: NEGATIVE
Nitrite: NEGATIVE
Protein, ur: NEGATIVE mg/dL
Specific Gravity, Urine: 1.01 (ref 1.005–1.030)
Urobilinogen, UA: 0.2 mg/dL (ref 0.0–1.0)
pH: 6.5 (ref 5.0–8.0)

## 2012-02-11 MED ORDER — OXYCODONE-ACETAMINOPHEN 5-325 MG PO TABS: 1.0000 | ORAL_TABLET | ORAL | Status: AC | PRN

## 2012-02-11 MED ORDER — OXYCODONE-ACETAMINOPHEN 5-325 MG PO TABS
2.0000 | ORAL_TABLET | Freq: Once | ORAL | Status: AC
  Administered 2012-02-11: 2 via ORAL
  Filled 2012-02-11: qty 2

## 2012-02-11 NOTE — Progress Notes (Addendum)
*  Preliminary Results* Bilateral lower extremity venous duplex completed. Bilateral lower extremities are negative for deep vein thrombosis. Small right Baker's cyst. Preliminary results discussed with Melvenia Beam, PA.  02/11/2012 3:54 PM Gertie Fey, RDMS, RDCS

## 2012-02-11 NOTE — ED Provider Notes (Signed)
History     CSN: [id number]  Arrival date & time 02/11/12  1228   First MD Initiated Contact with Patient 02/11/12 1248      Chief Complaint  Patient presents with  . Leg Pain    (Consider location/radiation/quality/duration/timing/severity/associated sxs/prior treatment) Patient is a 51 y.o. female presenting with leg pain. The history is provided by the patient.  Leg Pain  The incident occurred 3 to 5 hours ago. The incident occurred at home. There was no injury mechanism. The pain is present in the left thigh and right thigh. Pertinent negatives include no numbness. Associated symptoms comments: The patient has a history of breast cancer treated with lumpectomy and chemo until May of this year. She started radiation therapy this morning. After returning home she started having significant pain in her anterior thighs bilaterally that is sharp and constant. She did not feel she could move because of the pain and EMS was called. No fever. No history of similar symptoms. She took her usual Tramadol and gabapentin at home without relief. .    Past Medical History  Diagnosis Date  . Night sweats   . Fatigue   . Wears glasses   . SOB (shortness of breath) on exertion     walking and stairts  . Arthritis   . Hot flashes   . Blood transfusion   . Diverticulitis of colon   . Headache   . Anemia   . Breast cancer     T3N1 invasive ductal carcinoma left breast  . Bursitis   . Carpal tunnel syndrome     Past Surgical History  Procedure Date  . Cholecystectomy   . Appendectomy   . Carpal tunnel release     Bilateral  . Tonsillectomy   . Spur     Apex spur on both big toes  . Knee surgery     Left Knee  . Portacath placement 06/18/2011    Procedure: INSERTION PORT-A-CATH;  Surgeon: Mariella Saa, MD;  Location: [facility];  Service: General;  Laterality: Right;  right subclavian  . Abdominal hysterectomy     still has ovaries  . Axillary lymph node  dissection 11/28/2011    Procedure: AXILLARY LYMPH NODE DISSECTION;  Surgeon: Mariella Saa, MD;  Location: [facility] OR;  Service: General;  Laterality: Left;  . Breast surgery 2013  . Nasal sinus surgery     Family History  Problem Relation Age of Onset  . Cancer Paternal Grandmother     unknown    History  Substance Use Topics  . Smoking status: Never Smoker   . Smokeless tobacco: Never Used  . Alcohol Use: No    OB History    Grav Para Term Preterm Abortions TAB SAB Ect Mult Living                  Review of Systems  Constitutional: Negative for fever.  HENT: Negative for neck pain.   Respiratory: Negative for shortness of breath.   Cardiovascular: Negative for chest pain.  Gastrointestinal: Positive for nausea. Negative for vomiting and abdominal pain.  Genitourinary: Negative for dysuria.  Musculoskeletal:       See HPI.  Skin: Negative for rash and wound.  Neurological: Negative for weakness and numbness.    Allergies  Compazine; Aleve; and Penicillins  Home Medications   Current Outpatient Rx  Name Route Sig Dispense Refill  . GABAPENTIN 300 MG PO CAPS Oral Take 300 mg by mouth 3 (three)  times daily.     Marland Kitchen HYDROCODONE-ACETAMINOPHEN 5-500 MG PO TABS Oral Take 1-2 tablets by mouth every 6 (six) hours as needed. For pain    . LIDOCAINE-PRILOCAINE 2.5-2.5 % EX CREA Topical Apply 1 application topically daily as needed. Use 1 to 2 hours prior to treatment.    Marland Kitchen LORAZEPAM 0.5 MG PO TABS Oral Take 0.5 mg by mouth every 6 (six) hours as needed. For anxiety.    . TRAMADOL HCL 50 MG PO TABS Oral Take 50 mg by mouth every 6 (six) hours as needed. Pain      BP 134/80  Pulse 87  Temp 98.1 F (36.7 C) (Oral)  Resp 16  SpO2 100%  Physical Exam  Constitutional: She is oriented to person, place, and time. She appears well-developed and well-nourished.  HENT:  Head: Normocephalic.  Neck: Normal range of motion. Neck supple.  Cardiovascular: Normal rate and regular  rhythm.        Pulses in distal LE's present and equal.  Pulmonary/Chest: Effort normal and breath sounds normal.  Abdominal: Soft. Bowel sounds are normal. There is no tenderness. There is no rebound and no guarding.  Musculoskeletal: Normal range of motion. She exhibits tenderness. She exhibits no edema.       Thighs bilaterally unremarkable in appearance. No swelling or discoloration. Quadriceps tenderness without spasm, mass or induration. Full strength in legs bilaterally.  Neurological: She is alert and oriented to person, place, and time.  Skin: Skin is warm and dry. No rash noted. No erythema.  Psychiatric: She has a normal mood and affect.    ED Course  Procedures (including critical care time)   Labs Reviewed  URINALYSIS, ROUTINE W REFLEX MICROSCOPIC  CBC WITH DIFFERENTIAL  BASIC METABOLIC PANEL   No results found. Results for orders placed during the hospital encounter of 02/11/12  URINALYSIS, ROUTINE W REFLEX MICROSCOPIC      Component Value Range   Color, Urine YELLOW  YELLOW   APPearance CLEAR  CLEAR   Specific Gravity, Urine 1.010  1.005 - 1.030   pH 6.5  5.0 - 8.0   Glucose, UA NEGATIVE  NEGATIVE mg/dL   Hgb urine dipstick NEGATIVE  NEGATIVE   Bilirubin Urine NEGATIVE  NEGATIVE   Ketones, ur NEGATIVE  NEGATIVE mg/dL   Protein, ur NEGATIVE  NEGATIVE mg/dL   Urobilinogen, UA 0.2  0.0 - 1.0 mg/dL   Nitrite NEGATIVE  NEGATIVE   Leukocytes, UA NEGATIVE  NEGATIVE  CBC WITH DIFFERENTIAL      Component Value Range   WBC 4.0  4.0 - 10.5 K/uL   RBC 4.25  3.87 - 5.11 MIL/uL   Hemoglobin 11.6 (*) 12.0 - 15.0 g/dL   HCT 96.0 (*) 45.4 - 09.8 %   MCV 81.6  78.0 - 100.0 fL   MCH 27.3  26.0 - 34.0 pg   MCHC 33.4  30.0 - 36.0 g/dL   RDW 11.9  14.7 - 82.9 %   Platelets 196  150 - 400 K/uL   Neutrophils Relative 36 (*) 43 - 77 %   Neutro Abs 1.5 (*) 1.7 - 7.7 K/uL   Lymphocytes Relative 45  12 - 46 %   Lymphs Abs 1.8  0.7 - 4.0 K/uL   Monocytes Relative 15 (*) 3 - 12 %     Monocytes Absolute 0.6  0.1 - 1.0 K/uL   Eosinophils Relative 4  0 - 5 %   Eosinophils Absolute 0.2  0.0 - 0.7 K/uL  Basophils Relative 0  0 - 1 %   Basophils Absolute 0.0  0.0 - 0.1 K/uL   No results found.   No diagnosis found.  1. Muscular leg pain   MDM  Pain is improved. Re-exam: no swelling or change in coloration. Doppler studies bilaterally negative. Will discharge home.         Rodena Medin, PA-C 02/11/12 1550

## 2012-02-11 NOTE — ED Notes (Signed)
Per EMS- Pt c/o of bilateral leg pain that started this morning after radiation. States that she has breast cancer and had first treatment was this am. States that pt can walk and did ambulate to the stretcher. Pain 9/10.

## 2012-02-12 ENCOUNTER — Ambulatory Visit
Admission: RE | Admit: 2012-02-12 | Discharge: 2012-02-12 | Disposition: A | Discharge status: Home / Self Care | Source: Ambulatory Visit | Attending: Radiation Oncology | Admitting: Radiation Oncology

## 2012-02-12 VITALS — BP 147/92 | HR 68 | Temp 98.0°F | Wt 216.2 lb

## 2012-02-12 DIAGNOSIS — C50419 Malignant neoplasm of upper-outer quadrant of unspecified female breast: Secondary | ICD-10-CM

## 2012-02-12 MED ORDER — RADIAPLEXRX EX GEL
Freq: Once | CUTANEOUS | Status: AC
  Administered 2012-02-12: 12:00:00 via TOPICAL

## 2012-02-12 MED ORDER — ALRA NON-METALLIC DEODORANT (RAD-ONC)
1.0000 "application " | Freq: Once | TOPICAL | Status: AC
  Administered 2012-02-12: 1 via TOPICAL

## 2012-02-12 NOTE — Addendum Note (Signed)
Encounter addended by: Kylin Genna C Yandell Mcjunkins, RN on: 02/12/2012 11:44 AM<BR>     Documentation filed: Inpatient MAR

## 2012-02-12 NOTE — ED Provider Notes (Signed)
Medical screening examination/treatment/procedure(s) were conducted as a shared visit with non-physician practitioner(s) and myself.  I personally evaluated the patient during the encounter.  Derwood Kaplan, MD 02/12/12 [id number]

## 2012-02-12 NOTE — Progress Notes (Signed)
[facility]    Radiation Oncology [address] [place]     Maryln Gottron, M.D. [place], [place] [address]               Billie Lade, M.D., Ph.D. Phone: [phone]      Molli Hazard A. Kathrynn Running, M.D. Fax: [phone]      Radene Gunning, M.D., Ph.D.         Lurline Hare, M.D.         Grayland Jack, M.D Weekly Treatment Management Note  Name: Kelsey Wilson     MRN: [id number]        CSN: [id number] Date: 02/12/2012      DOB: [birth year]  CC: Kelsey Livings, MD         Roseanne Reno    Status: Outpatient  Diagnosis: The encounter diagnosis was Cancer of upper-outer quadrant of female breast.  Current Dose: 3.6 Gy  Current Fraction: 2  Planned Dose: 61.0 Gy  Narrative: Kelsey Wilson was seen today for weekly treatment management. The chart was checked and port films  were reviewed. She is tolerating her radiation treatment well at this time without any side effects. She has had some mild headaches. I recommend she consider Tylenol. She denies any visual problems.  Compazine; Aleve; and Penicillins Current Outpatient Prescriptions  Medication Sig Dispense Refill  . gabapentin (NEURONTIN) 300 MG capsule Take 300 mg by mouth 3 (three) times daily.       Marland Kitchen HYDROcodone-acetaminophen (VICODIN) 5-500 MG per tablet Take 1-2 tablets by mouth every 6 (six) hours as needed. For pain      . lidocaine-prilocaine (EMLA) cream Apply 1 application topically daily as needed. Use 1 to 2 hours prior to treatment.      Marland Kitchen LORazepam (ATIVAN) 0.5 MG tablet Take 0.5 mg by mouth every 6 (six) hours as needed. For anxiety.      Marland Kitchen oxyCODONE-acetaminophen (PERCOCET/ROXICET) 5-325 MG per tablet Take 1 tablet by mouth every 4 (four) hours as needed for pain.  15 tablet  0  . traMADol (ULTRAM) 50 MG tablet Take 50 mg by mouth every 6 (six) hours as needed. Pain       Current Facility-Administered Medications  Medication Dose Route Frequency Provider Last Rate Last Dose  . hyaluronate sodium  (RADIAPLEXRX) gel   Topical Once Lurline Hare, MD      . non-metallic deodorant Thornton Papas) 1 application  1 application Topical Once Lurline Hare, MD       Facility-Administered Medications Ordered in Other Encounters  Medication Dose Route Frequency Provider Last Rate Last Dose  . oxyCODONE-acetaminophen (PERCOCET/ROXICET) 5-325 MG per tablet 2 tablet  2 tablet Oral Once Rodena Medin, PA-C   2 tablet at 02/11/12 1343   Labs:  Lab Results  Component Value Date   WBC 4.0 02/11/2012   HGB 11.6* 02/11/2012   HCT 34.7* 02/11/2012   MCV 81.6 02/11/2012   PLT 196 02/11/2012   Lab Results  Component Value Date   CREATININE 0.53 02/11/2012   BUN 13 02/11/2012   NA 135 02/11/2012   K 3.9 02/11/2012   CL 101 02/11/2012   CO2 26 02/11/2012   Lab Results  Component Value Date   ALT 9 11/08/2011   AST 17 11/08/2011   BILITOT 1.1 11/08/2011    Physical Examination:  weight is 216 lb 3.2 oz (98.068 kg). Her temperature is 98 F (36.7 C). Her blood pressure is 147/92 and her pulse is 68.  Wt Readings from Last 3 Encounters:  02/12/12 216 lb 3.2 oz (98.068 kg)  02/08/12 216 lb 1.6 oz (98.022 kg)  01/17/12 217 lb 12.8 oz (98.793 kg)     Lungs - Normal respiratory effort, chest expands symmetrically. Lungs are clear to auscultation, no crackles or wheezes.  Heart has regular rhythm and rate  Abdomen is soft and non tender with normal bowel sounds The left breast area shows no appreciable skin reaction at this point.  Assessment:  Patient tolerating treatments well  Plan: Continue treatment per original radiation prescription    -----------------------------------  Billie Lade, PhD, MD

## 2012-02-12 NOTE — Progress Notes (Signed)
Here for weekly under treat visit for left breast  Cancer.Has complete 2 of 33 treatments.Given radiaplex and alra and reviewed side effects of radiation and routine of clinic. Has peripheral neuropathy from chemotherapy.Takes gabapentin since occurrence of shingles.

## 2012-02-13 ENCOUNTER — Ambulatory Visit

## 2012-02-13 ENCOUNTER — Ambulatory Visit
Admission: RE | Admit: 2012-02-13 | Discharge: 2012-02-13 | Disposition: A | Discharge status: Home / Self Care | Source: Ambulatory Visit | Attending: Radiation Oncology | Admitting: Radiation Oncology

## 2012-02-14 ENCOUNTER — Ambulatory Visit
Admission: RE | Admit: 2012-02-14 | Discharge: 2012-02-14 | Disposition: A | Discharge status: Home / Self Care | Source: Ambulatory Visit | Attending: Radiation Oncology | Admitting: Radiation Oncology

## 2012-02-14 ENCOUNTER — Encounter

## 2012-02-15 ENCOUNTER — Ambulatory Visit

## 2012-02-15 ENCOUNTER — Ambulatory Visit
Admission: RE | Admit: 2012-02-15 | Discharge: 2012-02-15 | Disposition: A | Discharge status: Home / Self Care | Source: Ambulatory Visit | Attending: Radiation Oncology | Admitting: Radiation Oncology

## 2012-02-18 ENCOUNTER — Ambulatory Visit
Admission: RE | Admit: 2012-02-18 | Discharge: 2012-02-18 | Disposition: A | Discharge status: Home / Self Care | Source: Ambulatory Visit | Attending: Radiation Oncology | Admitting: Radiation Oncology

## 2012-02-19 ENCOUNTER — Ambulatory Visit: Admission: RE | Admit: 2012-02-19 | Source: Ambulatory Visit

## 2012-02-19 ENCOUNTER — Encounter: Payer: Self-pay | Admitting: Radiation Oncology

## 2012-02-19 ENCOUNTER — Ambulatory Visit
Admission: RE | Admit: 2012-02-19 | Discharge: 2012-02-19 | Disposition: A | Discharge status: Home / Self Care | Source: Ambulatory Visit | Attending: Radiation Oncology | Admitting: Radiation Oncology

## 2012-02-19 VITALS — BP 134/79 | HR 69 | Temp 98.4°F | Resp 20 | Wt 219.1 lb

## 2012-02-19 DIAGNOSIS — C50419 Malignant neoplasm of upper-outer quadrant of unspecified female breast: Secondary | ICD-10-CM

## 2012-02-19 NOTE — Progress Notes (Signed)
Weekly Management Note Current Dose:  10.8 Gy  Projected Dose: 61 Gy   Narrative:  The patient presents for routine under treatment assessment.  CBCT/MVCT images/Port film x-rays were reviewed.  The chart was checked. Doing well. Not treated today due to treatment machine being down. Concerned about headaches, muscle aches, hot flashes and joint aches.  Physical Findings: Weight: 219 lb 1.6 oz (99.383 kg). Unchanged skin.  Impression:  The patient is tolerating radiation.  Plan:  Continue treatment as planned. Continue radiaplex. Contact med onc re: systemic symptoms.

## 2012-02-19 NOTE — Progress Notes (Signed)
Pt c/o fatigue, "swelling of left breast, arm, hand due to lymphedema". She states she "wakes w/her left breast swollen every day since her surgery". Pt takes Tramadol twice daily for pain in left arm, hand. She wears sleeve and glove daily. Applying Radiaplex to left breast.

## 2012-02-20 ENCOUNTER — Ambulatory Visit: Admitting: Physical Therapy

## 2012-02-20 ENCOUNTER — Ambulatory Visit
Admission: RE | Admit: 2012-02-20 | Discharge: 2012-02-20 | Disposition: A | Discharge status: Home / Self Care | Source: Ambulatory Visit | Attending: Radiation Oncology | Admitting: Radiation Oncology

## 2012-02-21 ENCOUNTER — Ambulatory Visit
Admission: RE | Admit: 2012-02-21 | Discharge: 2012-02-21 | Disposition: A | Discharge status: Home / Self Care | Source: Ambulatory Visit | Attending: Radiation Oncology | Admitting: Radiation Oncology

## 2012-02-22 ENCOUNTER — Ambulatory Visit
Admission: RE | Admit: 2012-02-22 | Discharge: 2012-02-22 | Disposition: A | Discharge status: Home / Self Care | Source: Ambulatory Visit | Attending: Radiation Oncology | Admitting: Radiation Oncology

## 2012-02-25 ENCOUNTER — Ambulatory Visit: Admitting: Physical Therapy

## 2012-02-25 ENCOUNTER — Ambulatory Visit
Admission: RE | Admit: 2012-02-25 | Discharge: 2012-02-25 | Disposition: A | Discharge status: Home / Self Care | Source: Ambulatory Visit | Attending: Radiation Oncology | Admitting: Radiation Oncology

## 2012-02-26 ENCOUNTER — Ambulatory Visit
Admission: RE | Admit: 2012-02-26 | Discharge: 2012-02-26 | Disposition: A | Discharge status: Home / Self Care | Source: Ambulatory Visit | Attending: Radiation Oncology | Admitting: Radiation Oncology

## 2012-02-26 ENCOUNTER — Encounter: Payer: Self-pay | Admitting: Radiation Oncology

## 2012-02-26 ENCOUNTER — Telehealth: Payer: Self-pay | Admitting: *Deleted

## 2012-02-26 VITALS — BP 129/74 | HR 69 | Temp 97.5°F | Resp 18 | Wt 217.5 lb

## 2012-02-26 DIAGNOSIS — C50419 Malignant neoplasm of upper-outer quadrant of unspecified female breast: Secondary | ICD-10-CM

## 2012-02-26 NOTE — Progress Notes (Signed)
Patient presents to the clinic today for a PUT with Dr. Michell Heinrich. Patient is alert and oriented to person, place, and time. No distress noted. Steady gait noted. Pleasant affect noted. Patient reports a headache 10 on a scale of 0-10 since yesterday. Patient denies skin changes to left/treated breast. Patient reports using radiaplex gel as directed. Patient reports her left breast is sore. Reported all finding to Dr. Michell Heinrich.

## 2012-02-26 NOTE — Telephone Encounter (Signed)
CALLED PATIENT TO INFORM OF TEST, SPOKE WITH PATIENT AND SHE IS AWARE OF THIS.

## 2012-02-26 NOTE — Progress Notes (Signed)
Weekly Management Note Current Dose: 19.8  Gy  Projected Dose: 61 Gy   Narrative:  The patient presents for routine under treatment assessment.  CBCT/MVCT images/Port film x-rays were reviewed.  The chart was checked.  Frontal headache continues. Relieved with tylenol. No focal weakness or vision changes. Left breast feels "heavy"  Physical Findings: Weight: 217 lb 8 oz (98.657 kg). Left breast slightly dark. Pt is sitting in dark. Alert and oriented x 3.   Impression:  The patient is tolerating radiation.  Plan:  Continue treatment as planned. MRI of brain to investigate headaches. Continue radiaplex.

## 2012-02-26 NOTE — Telephone Encounter (Signed)
XXXX 

## 2012-02-27 ENCOUNTER — Ambulatory Visit

## 2012-02-27 ENCOUNTER — Ambulatory Visit
Admission: RE | Admit: 2012-02-27 | Discharge: 2012-02-27 | Disposition: A | Discharge status: Home / Self Care | Source: Ambulatory Visit | Attending: Radiation Oncology | Admitting: Radiation Oncology

## 2012-02-28 ENCOUNTER — Ambulatory Visit: Admitting: Physical Therapy

## 2012-02-28 ENCOUNTER — Other Ambulatory Visit: Payer: Self-pay | Admitting: Radiation Oncology

## 2012-02-28 ENCOUNTER — Ambulatory Visit
Admission: RE | Admit: 2012-02-28 | Discharge: 2012-02-28 | Disposition: A | Discharge status: Home / Self Care | Source: Ambulatory Visit | Attending: Radiation Oncology | Admitting: Radiation Oncology

## 2012-02-28 ENCOUNTER — Ambulatory Visit (HOSPITAL_COMMUNITY)
Admission: RE | Admit: 2012-02-28 | Discharge: 2012-02-28 | Disposition: A | Discharge status: Home / Self Care | Source: Ambulatory Visit | Attending: Radiation Oncology | Admitting: Radiation Oncology

## 2012-02-28 DIAGNOSIS — C50419 Malignant neoplasm of upper-outer quadrant of unspecified female breast: Secondary | ICD-10-CM

## 2012-02-28 DIAGNOSIS — C50919 Malignant neoplasm of unspecified site of unspecified female breast: Secondary | ICD-10-CM | POA: Insufficient documentation

## 2012-02-28 DIAGNOSIS — G9389 Other specified disorders of brain: Secondary | ICD-10-CM | POA: Insufficient documentation

## 2012-02-28 DIAGNOSIS — D1802 Hemangioma of intracranial structures: Secondary | ICD-10-CM | POA: Insufficient documentation

## 2012-02-28 DIAGNOSIS — R51 Headache: Secondary | ICD-10-CM | POA: Insufficient documentation

## 2012-02-28 DIAGNOSIS — J3489 Other specified disorders of nose and nasal sinuses: Secondary | ICD-10-CM | POA: Insufficient documentation

## 2012-02-28 IMAGING — MR MR HEAD W/O CM
6 of 8 series · 29 of 48 positions shown · non-contrast
Comparison: No known priors.

CLINICAL DATA: Breast cancer.  New onset of headaches.

MRI HEAD WITHOUT CONTRAST
TECHNIQUE: Multiplanar, multiecho pulse sequences of the brain and
surrounding structures were obtained according to standard protocol
without intravenous contrast.
Multiple attempts were made to gain IV access, but all were
unsuccessful.  Study done without contrast.

[Series 3: T1 · sagittal · 5.0mm · 0.47mm/px · 1 of 24 slices shown]
[im 1/24]
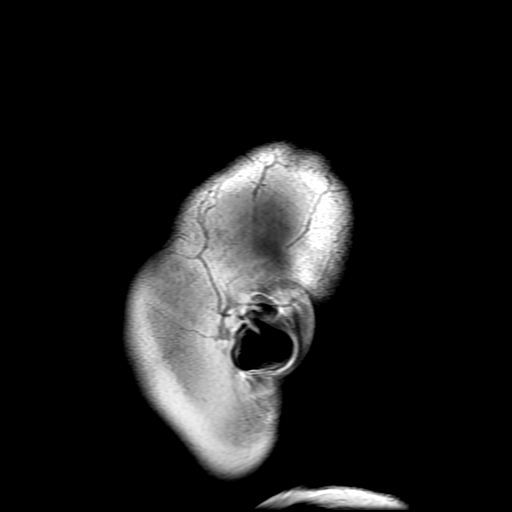

[Series 4: DWI · axial · 5.0mm · 1.09mm/px · z∈[-33,+111]mm · 11 of 60 slices shown (1 of 2)]
[im 1/60]
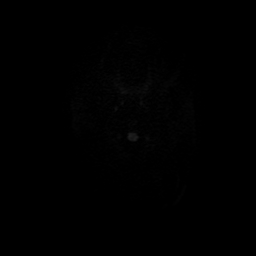
[im 6/60]
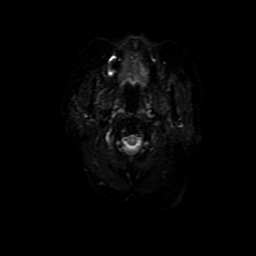
[im 12/60]
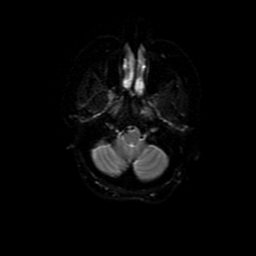
[im 18/60]
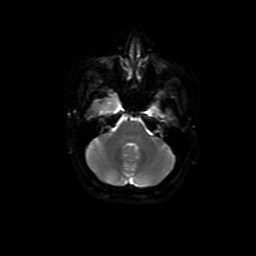
[im 24/60]
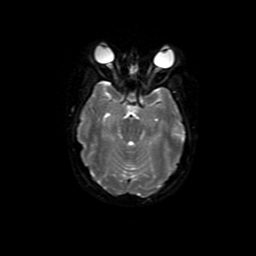
[im 30/60]
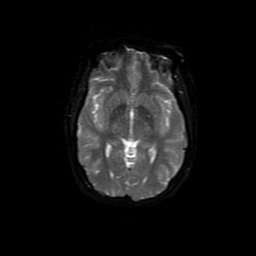
[im 36/60]
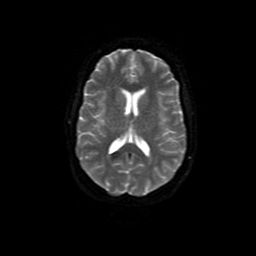
[im 42/60]
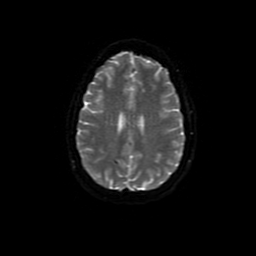
[im 48/60]
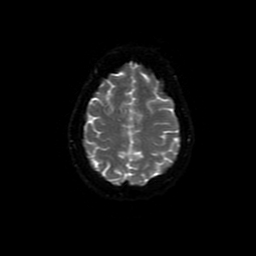
[im 54/60]
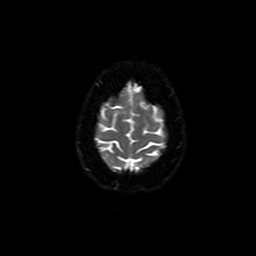
[im 60/60]
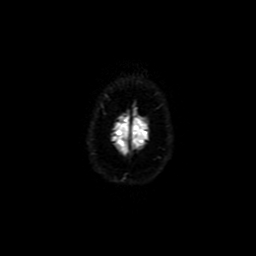

[Series 5: T2 · axial · 5.0mm · 0.43mm/px · z∈[-42,+107]mm · 4 of 24 slices shown (1 of 2)]
[im 1/24]
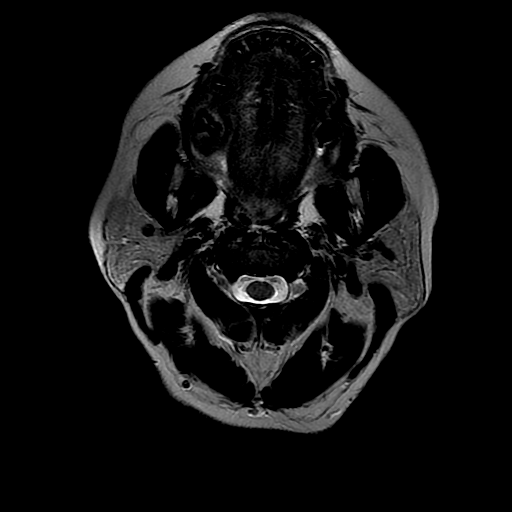
[im 8/24]
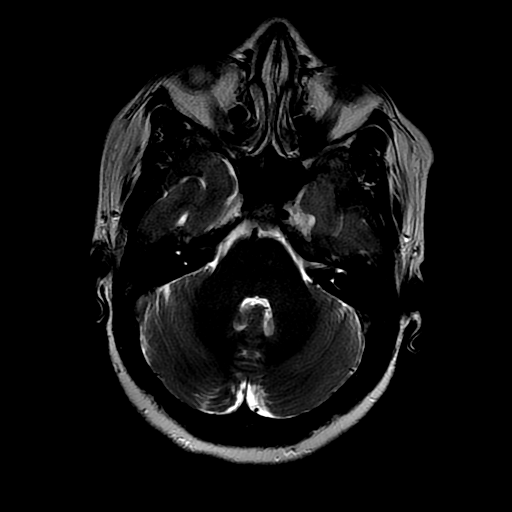
[im 16/24]
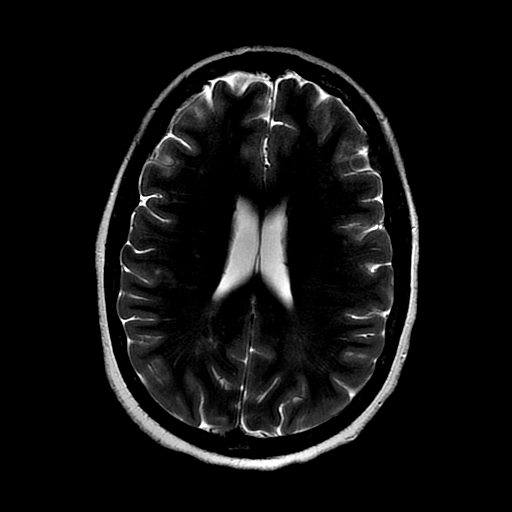
[im 24/24]
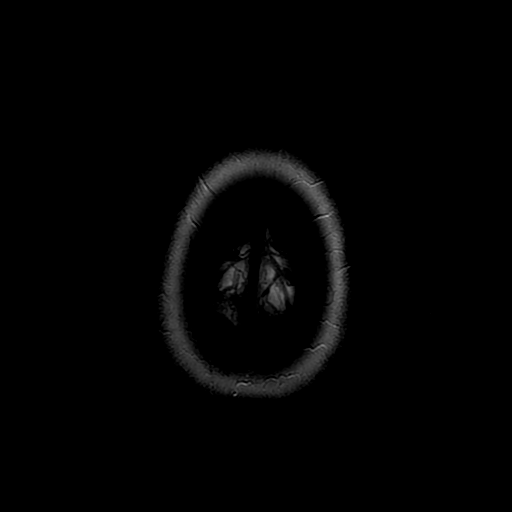

[Series 6: FLAIR · axial · 5.0mm · 0.43mm/px · z∈[-47,+113]mm · 4 of 24 slices shown]
[im 1/24]
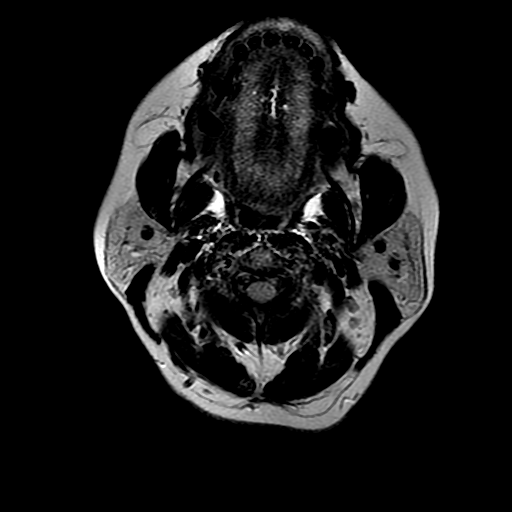
[im 8/24]
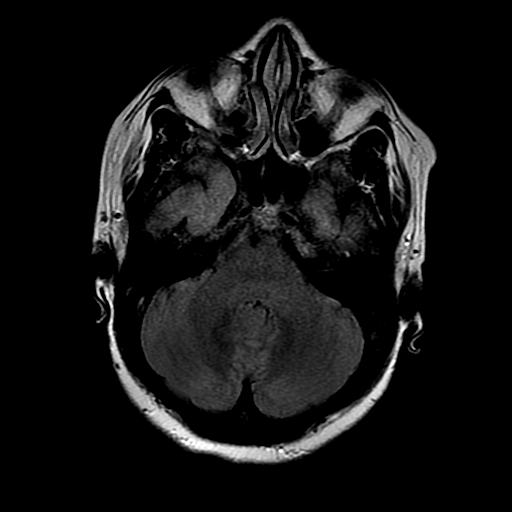
[im 16/24]
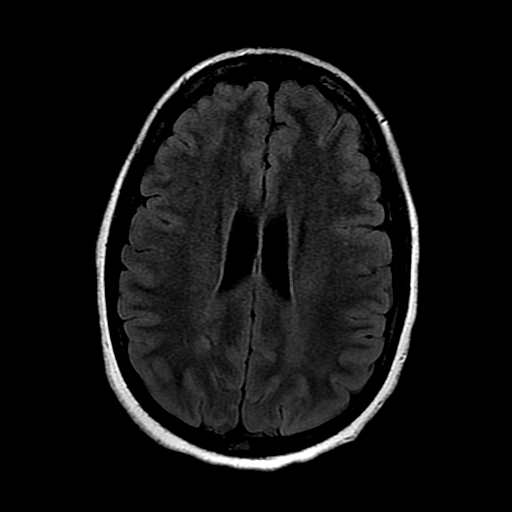
[im 24/24]
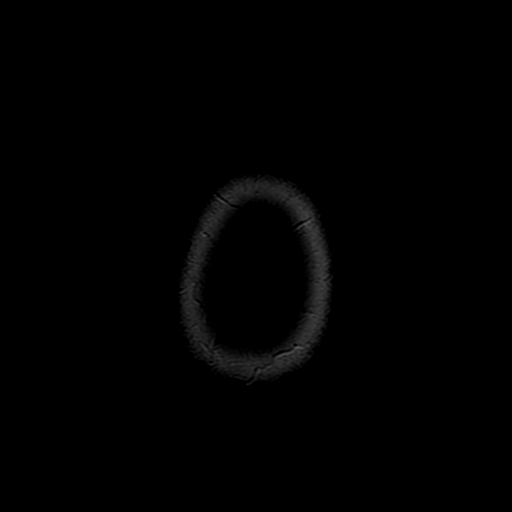

[Series 9: T2 · coronal · 5.0mm · 0.45mm/px · 4 of 24 slices shown (2 of 2)]
[im 1/24]
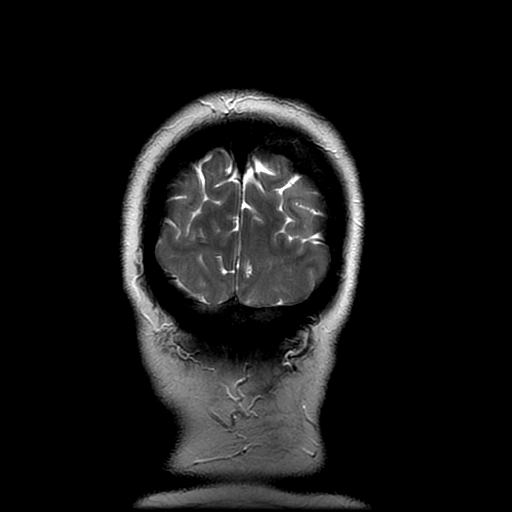
[im 8/24]
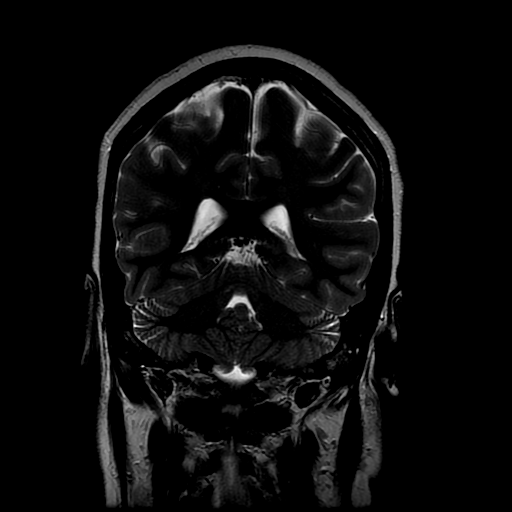
[im 16/24]
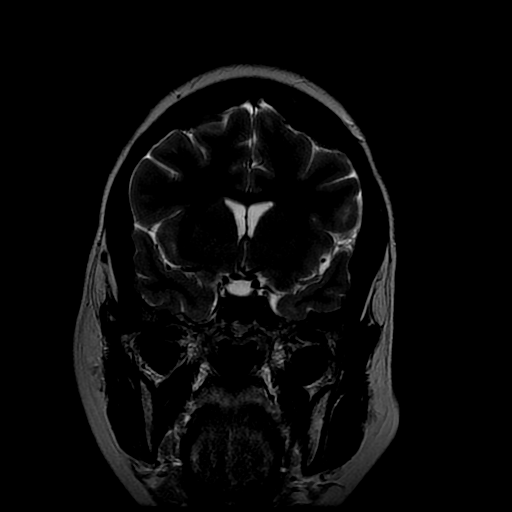
[im 24/24]
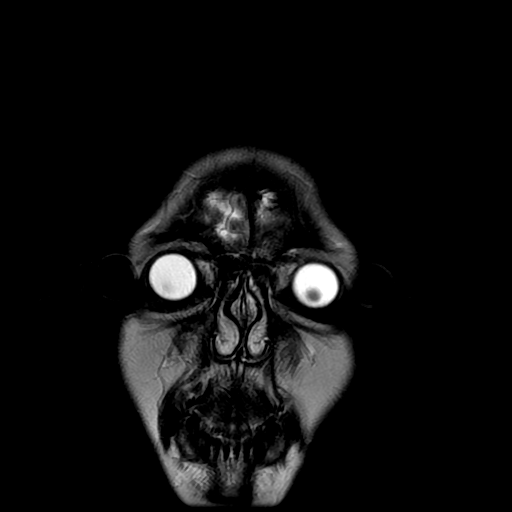

[Series 400: DWI · axial · 5.0mm · 1.09mm/px · z∈[-33,+111]mm · 5 of 30 slices shown (2 of 2)]
[im 1/30]
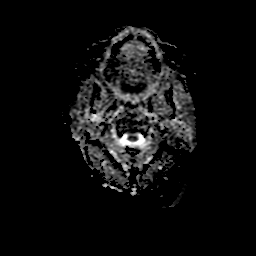
[im 8/30]
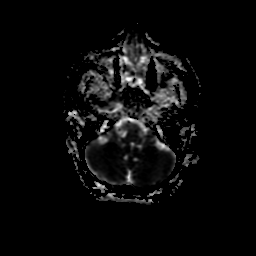
[im 15/30]
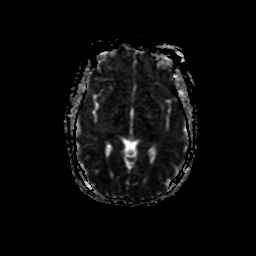
[im 22/30]
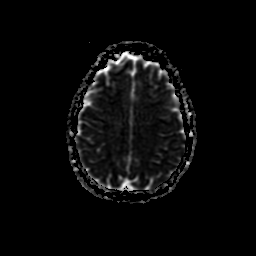
[im 30/30]
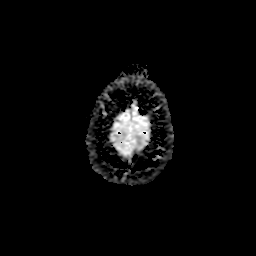

[29 of 48 positions shown; findings below may reference images not displayed]

FINDINGS: There is no evidence for acute stroke, intracranial
hemorrhage, mass lesion, hydrocephalus, or extra-axial fluid.

No atrophy is present.  Mild chronic microvascular ischemic changes
noted.   There are no areas of vasogenic edema which might be
suspicious for metastases. The skull base, upper cervical spine,
and calvarium appear intact without osseous disease.

There is slight descent of the cerebellar tonsils below an
imaginary line across the foramen magnum fall approximately 5 mm.
The tonsils have an equivocal abnormal peg like shape.  The
findings are consistent with borderline Chiari I malformation.
Continued surveillance may be warranted with a repeat MRI in 1
year.  Shallow central protrusion is incidentally noted C2-C3.

In the medial right parietal lobe in a periventricular location
near the forceps major corpus callosum, there is a 1 cm mixed
attenuation well circumscribed lesion without surrounding edema.
This lesion displays both subacute and chronic blood products, and
represents an incidental cavernoma. There is also an incidental
dilated medially draining cortical vein to the superior sagittal
sinus, commonly associated with OCVMs.

Mild chronic sinus disease.  Negative orbits.  No mastoid fluid.
IMPRESSION: Within limits of noncontrast examination, no visible intracranial
metastatic disease.  See discussion above.

Incidental cavernoma and venous angioma medial right parietal lobe.

Mild chronic microvascular ischemic change.

Central protrusion C[DATE] be incidental.

Borderline Chiari I malformation without significant impaction
cerebellar tonsils.

## 2012-02-29 ENCOUNTER — Ambulatory Visit: Admitting: Physical Therapy

## 2012-02-29 ENCOUNTER — Ambulatory Visit
Admission: RE | Admit: 2012-02-29 | Discharge: 2012-02-29 | Disposition: A | Discharge status: Home / Self Care | Source: Ambulatory Visit | Attending: Radiation Oncology | Admitting: Radiation Oncology

## 2012-03-03 ENCOUNTER — Ambulatory Visit: Admitting: Physical Therapy

## 2012-03-03 ENCOUNTER — Ambulatory Visit
Admission: RE | Admit: 2012-03-03 | Discharge: 2012-03-03 | Disposition: A | Discharge status: Home / Self Care | Source: Ambulatory Visit | Attending: Radiation Oncology | Admitting: Radiation Oncology

## 2012-03-04 ENCOUNTER — Ambulatory Visit
Admission: RE | Admit: 2012-03-04 | Discharge: 2012-03-04 | Disposition: A | Discharge status: Home / Self Care | Source: Ambulatory Visit | Attending: Radiation Oncology | Admitting: Radiation Oncology

## 2012-03-04 ENCOUNTER — Encounter: Payer: Self-pay | Admitting: Radiation Oncology

## 2012-03-04 VITALS — BP 133/71 | HR 74 | Resp 18 | Wt 218.6 lb

## 2012-03-04 DIAGNOSIS — C50419 Malignant neoplasm of upper-outer quadrant of unspecified female breast: Secondary | ICD-10-CM

## 2012-03-04 NOTE — Progress Notes (Addendum)
Patient presents to the clinic today unaccompanied for PUT with Dr. Michell Heinrich. Patient is alert and oriented to person, place, and time. No distress noted. Steady gait noted. Pleasant affect noted. Patient denies pain at this time. Patient reports that the daily headaches continue but are less intense. Patient reports hyperpigmentation of left/treated breast specifically around the nipple without desquamation. Patient reports that she continues to use Radiaplex as directed.  Patient reports her left breast is tender and sore. Patient reports that occasionally she itches "from the inside but, a benadryl takes care of it." The patient and I talked at length about not taking on more than she could manage in relation to caring for her five grandchildren. Encouraged her to take necessary rest breaks and put her needs first. Reported all findings to Dr. Michell Heinrich.

## 2012-03-04 NOTE — Progress Notes (Signed)
Weekly Management Note Current Dose:  18 Gy  Projected Dose: 60.4 Gy   Narrative:  The patient presents for routine under treatment assessment.  CBCT/MVCT images/Port film x-rays were reviewed.  The chart was checked. Tired. MRI brain negative. Headaches better. Using radiaplex she states.   Physical Findings: Weight: 218 lb 9.6 oz (99.156 kg). Early dry desquamation in inframmary fold.   Impression:  The patient is tolerating radiation.  Plan:  Continue treatment as planned. Reinforced use of radiaplex and keeping this area dry.

## 2012-03-05 ENCOUNTER — Ambulatory Visit

## 2012-03-05 ENCOUNTER — Ambulatory Visit: Admitting: Physical Therapy

## 2012-03-06 ENCOUNTER — Ambulatory Visit
Admission: RE | Admit: 2012-03-06 | Discharge: 2012-03-06 | Disposition: A | Discharge status: Home / Self Care | Source: Ambulatory Visit | Attending: Radiation Oncology | Admitting: Radiation Oncology

## 2012-03-07 ENCOUNTER — Ambulatory Visit
Admission: RE | Admit: 2012-03-07 | Discharge: 2012-03-07 | Disposition: A | Discharge status: Home / Self Care | Source: Ambulatory Visit | Attending: Radiation Oncology | Admitting: Radiation Oncology

## 2012-03-07 ENCOUNTER — Ambulatory Visit: Attending: Physician Assistant | Admitting: Physical Therapy

## 2012-03-07 DIAGNOSIS — M24519 Contracture, unspecified shoulder: Secondary | ICD-10-CM | POA: Insufficient documentation

## 2012-03-07 DIAGNOSIS — IMO0001 Reserved for inherently not codable concepts without codable children: Secondary | ICD-10-CM | POA: Insufficient documentation

## 2012-03-07 DIAGNOSIS — I890 Lymphedema, not elsewhere classified: Secondary | ICD-10-CM | POA: Insufficient documentation

## 2012-03-07 DIAGNOSIS — C50919 Malignant neoplasm of unspecified site of unspecified female breast: Secondary | ICD-10-CM | POA: Insufficient documentation

## 2012-03-07 DIAGNOSIS — M25519 Pain in unspecified shoulder: Secondary | ICD-10-CM | POA: Insufficient documentation

## 2012-03-10 ENCOUNTER — Ambulatory Visit
Admission: RE | Admit: 2012-03-10 | Discharge: 2012-03-10 | Disposition: A | Discharge status: Home / Self Care | Source: Ambulatory Visit | Attending: Radiation Oncology | Admitting: Radiation Oncology

## 2012-03-11 ENCOUNTER — Ambulatory Visit
Admission: RE | Admit: 2012-03-11 | Discharge: 2012-03-11 | Disposition: A | Discharge status: Home / Self Care | Source: Ambulatory Visit | Attending: Radiation Oncology | Admitting: Radiation Oncology

## 2012-03-11 ENCOUNTER — Ambulatory Visit: Admitting: Physical Therapy

## 2012-03-11 ENCOUNTER — Encounter: Payer: Self-pay | Admitting: Radiation Oncology

## 2012-03-11 VITALS — BP 122/70 | HR 72 | Resp 18 | Wt 219.8 lb

## 2012-03-11 DIAGNOSIS — C50419 Malignant neoplasm of upper-outer quadrant of unspecified female breast: Secondary | ICD-10-CM

## 2012-03-11 MED ORDER — RADIAPLEXRX EX GEL
Freq: Once | CUTANEOUS | Status: AC
  Administered 2012-03-11: 13:00:00 via TOPICAL

## 2012-03-11 NOTE — Progress Notes (Signed)
Patient presents to the clinic today unaccompanied for PUT with Dr. Michell Heinrich. Patient is alert and oriented to person, place, and time. No distress noted. Slow steady gait noted with assistance of cane. Patient reports a mild headache. Hyperpigmentation of left breast noted without desquamation. Patient reports using Radiaplex as directed. Provided patient with additional tube of radiaplex. Reported all findings to Dr. Michell Heinrich.

## 2012-03-11 NOTE — Addendum Note (Signed)
Encounter addended by: Agnes Lawrence, RN on: 03/11/2012  1:16 PM<BR>     Documentation filed: Inpatient MAR, Orders

## 2012-03-11 NOTE — Progress Notes (Signed)
Weekly Management Note Current Dose:  36 Gy  Projected Dose: 61 Gy   Narrative:  The patient presents for routine under treatment assessment.  CBCT/MVCT images/Port film x-rays were reviewed.  The chart was checked. Doing well. C/o soreness in inframammary fold. Headaches continue but mild. Some fatigue  Physical Findings: Weight: 219 lb 12.8 oz (99.701 kg). Unchanged. Breast is dark. Inframmary folds are clear.  Impression:  The patient is tolerating radiation.  Plan:  Continue treatment as planned. Continue radiaplex. Keep inframammary fold clear.  Wear a bra as little as possible.

## 2012-03-12 ENCOUNTER — Ambulatory Visit: Admitting: Physical Therapy

## 2012-03-12 ENCOUNTER — Ambulatory Visit
Admission: RE | Admit: 2012-03-12 | Discharge: 2012-03-12 | Disposition: A | Discharge status: Home / Self Care | Source: Ambulatory Visit | Attending: Radiation Oncology | Admitting: Radiation Oncology

## 2012-03-13 ENCOUNTER — Ambulatory Visit
Admission: RE | Admit: 2012-03-13 | Discharge: 2012-03-13 | Disposition: A | Discharge status: Home / Self Care | Source: Ambulatory Visit | Attending: Radiation Oncology | Admitting: Radiation Oncology

## 2012-03-14 ENCOUNTER — Ambulatory Visit
Admission: RE | Admit: 2012-03-14 | Discharge: 2012-03-14 | Disposition: A | Discharge status: Home / Self Care | Source: Ambulatory Visit | Attending: Radiation Oncology | Admitting: Radiation Oncology

## 2012-03-14 ENCOUNTER — Encounter: Payer: Self-pay | Admitting: Radiation Oncology

## 2012-03-14 NOTE — Progress Notes (Deleted)
.  sw

## 2012-03-17 ENCOUNTER — Ambulatory Visit

## 2012-03-17 ENCOUNTER — Ambulatory Visit: Admitting: Physical Therapy

## 2012-03-18 ENCOUNTER — Encounter: Payer: Self-pay | Admitting: Radiation Oncology

## 2012-03-18 ENCOUNTER — Ambulatory Visit
Admission: RE | Admit: 2012-03-18 | Discharge: 2012-03-18 | Disposition: A | Discharge status: Home / Self Care | Source: Ambulatory Visit | Attending: Radiation Oncology | Admitting: Radiation Oncology

## 2012-03-18 ENCOUNTER — Other Ambulatory Visit: Payer: Self-pay | Admitting: Family

## 2012-03-18 VITALS — BP 127/78 | HR 65 | Resp 18 | Wt 216.1 lb

## 2012-03-18 DIAGNOSIS — C50419 Malignant neoplasm of upper-outer quadrant of unspecified female breast: Secondary | ICD-10-CM

## 2012-03-18 NOTE — Progress Notes (Signed)
Patient presents to the clinic today for PUT with Dr. Michell Heinrich. Patient is alert and oriented to person, place, and time. No distress noted. Steady gait noted. Pleasant affect noted. Patient reports left breast discomfort. Hyperpigmentation with desquamation at the left neck and under the left breast. Patient reports using Radiaplex gel as directed. Patient questions if she could get some hydrogel pads. Patient reports that she had a severe headache over the weekend requiring her to leave church on Sunday. Patient reports that she take gabapentin and tylenol for these headaches. Patient reports continued fatigue. Reported all findings to Dr. Michell Heinrich.

## 2012-03-18 NOTE — Progress Notes (Signed)
Weekly Management Note Current Dose: 43.2  Gy  Projected Dose: 61 Gy   Narrative:  The patient presents for routine under treatment assessment.  CBCT/MVCT images/Port film x-rays were reviewed.  The chart was checked. Headaches continue. Skin is raw under breast and in axilla. Using radiaplex.  Physical Findings: Weight: 216 lb 1.6 oz (98.022 kg). Dry desquamation in axilla. Some early moist desquamation under breast.   Impression:  The patient is tolerating radiation.  Plan:  Continue treatment as planned. Add hydrogel. Starting boost soon so skin should begin to heal.

## 2012-03-19 ENCOUNTER — Ambulatory Visit
Admission: RE | Admit: 2012-03-19 | Discharge: 2012-03-19 | Disposition: A | Discharge status: Home / Self Care | Source: Ambulatory Visit | Attending: Radiation Oncology | Admitting: Radiation Oncology

## 2012-03-20 ENCOUNTER — Ambulatory Visit
Admission: RE | Admit: 2012-03-20 | Discharge: 2012-03-20 | Disposition: A | Discharge status: Home / Self Care | Source: Ambulatory Visit | Attending: Radiation Oncology | Admitting: Radiation Oncology

## 2012-03-20 ENCOUNTER — Other Ambulatory Visit: Payer: Self-pay | Admitting: *Deleted

## 2012-03-20 ENCOUNTER — Ambulatory Visit: Admitting: Physical Therapy

## 2012-03-20 DIAGNOSIS — C50919 Malignant neoplasm of unspecified site of unspecified female breast: Secondary | ICD-10-CM

## 2012-03-20 DIAGNOSIS — C50419 Malignant neoplasm of upper-outer quadrant of unspecified female breast: Secondary | ICD-10-CM

## 2012-03-20 MED ORDER — TRAMADOL HCL 50 MG PO TABS
50.0000 mg | ORAL_TABLET | Freq: Four times a day (QID) | ORAL | Status: DC | PRN
Start: 2012-03-20 — End: 2012-04-29

## 2012-03-20 NOTE — Progress Notes (Signed)
Weekly Management Note Current Dose: 47  Gy  Projected Dose: 61 Gy   Narrative:  The patient presents for routine under treatment assessment.  CBCT/MVCT images/Port film x-rays were reviewed.  The chart was checked. Checked ebeam on tx machine today. Skin still sore. No moist desquamation. Hydrogel helping.   Physical Findings: Weight:  . Unchanged  Impression:  The patient is tolerating radiation.  Plan:  Continue treatment as planned.

## 2012-03-21 ENCOUNTER — Ambulatory Visit
Admission: RE | Admit: 2012-03-21 | Discharge: 2012-03-21 | Disposition: A | Discharge status: Home / Self Care | Source: Ambulatory Visit | Attending: Radiation Oncology | Admitting: Radiation Oncology

## 2012-03-21 NOTE — Progress Notes (Signed)
Late entry from 03/18/12 at 0950. Provided patient with hydrogel pad as directed by Dr. Michell Heinrich. Educated patient on storage and use. Patient verbalized understanding. Charge sheet faxed and confirmation of delivery fax obtained.

## 2012-03-21 NOTE — Addendum Note (Signed)
Encounter addended by: Agnes Lawrence, RN on: 03/21/2012  2:16 PM<BR>     Documentation filed: Notes Section

## 2012-03-24 ENCOUNTER — Ambulatory Visit
Admission: RE | Admit: 2012-03-24 | Discharge: 2012-03-24 | Disposition: A | Discharge status: Home / Self Care | Source: Ambulatory Visit | Attending: Radiation Oncology | Admitting: Radiation Oncology

## 2012-03-24 ENCOUNTER — Encounter: Payer: Self-pay | Admitting: Oncology

## 2012-03-24 NOTE — Progress Notes (Signed)
Patient came in office. [employer] had not received her payment. Checked into processing of check and check was cut and will be mailed today. Called patient back to advise of check going to  [employer] on today. It was processed on 03/18/12 but check had not gone out yet.

## 2012-03-25 ENCOUNTER — Ambulatory Visit
Admission: RE | Admit: 2012-03-25 | Discharge: 2012-03-25 | Disposition: A | Discharge status: Home / Self Care | Source: Ambulatory Visit | Attending: Radiation Oncology | Admitting: Radiation Oncology

## 2012-03-25 VITALS — BP 120/86 | HR 80 | Resp 18 | Wt 219.1 lb

## 2012-03-25 DIAGNOSIS — C50419 Malignant neoplasm of upper-outer quadrant of unspecified female breast: Secondary | ICD-10-CM

## 2012-03-25 NOTE — Progress Notes (Signed)
Weekly Management Note Current Dose: 53  Gy  Projected Dose: 61 Gy   Narrative:  The patient presents for routine under treatment assessment.  CBCT/MVCT images/Port film x-rays were reviewed.  The chart was checked. Pain due to lymphedema.  Has sleeve but not wearing. Using radiaplex.   Physical Findings: Weight: 219 lb 1.6 oz (99.383 kg). Dark left breast and sclv fossa. No moist desquamation  Impression:  The patient is tolerating radiation.  Plan:  Continue treatment as planned. Continue radiaplex. We discussed use of lotion with vit e. F/u in 1 month. F/U with med onc tomorrow.

## 2012-03-25 NOTE — Progress Notes (Signed)
Patient presents to the clinic today accompanied by her family for PUT with Dr. Michell Heinrich. Patient is alert and oriented to person, place, and time. No distress noted. Steady gait noted. Pleasant affect noted. Patient reports left breast and left arm pain 6 on a scale of 0-10 related to effects of lymphedema. Encouraged patient to wear lymphedema sleeve. Hyperpigmentation with dry desquamation under left breast and at left neck noted. Patient reports that she continues to use Radiaplex as directed. Reported all findings to Dr. Michell Heinrich.

## 2012-03-26 ENCOUNTER — Ambulatory Visit (HOSPITAL_BASED_OUTPATIENT_CLINIC_OR_DEPARTMENT_OTHER): Admitting: Oncology

## 2012-03-26 ENCOUNTER — Telehealth: Payer: Self-pay | Admitting: *Deleted

## 2012-03-26 ENCOUNTER — Ambulatory Visit
Admission: RE | Admit: 2012-03-26 | Discharge: 2012-03-26 | Disposition: A | Discharge status: Home / Self Care | Source: Ambulatory Visit | Attending: Radiation Oncology | Admitting: Radiation Oncology

## 2012-03-26 ENCOUNTER — Ambulatory Visit

## 2012-03-26 VITALS — BP 136/84 | HR 71 | Temp 97.8°F | Resp 20 | Ht 63.0 in | Wt 218.0 lb

## 2012-03-26 DIAGNOSIS — C50419 Malignant neoplasm of upper-outer quadrant of unspecified female breast: Secondary | ICD-10-CM

## 2012-03-26 DIAGNOSIS — I890 Lymphedema, not elsewhere classified: Secondary | ICD-10-CM

## 2012-03-26 DIAGNOSIS — Z170 Estrogen receptor positive status [ER+]: Secondary | ICD-10-CM

## 2012-03-26 DIAGNOSIS — G620 Drug-induced polyneuropathy: Secondary | ICD-10-CM

## 2012-03-26 MED ORDER — PREGABALIN 75 MG PO CAPS: 75.0000 mg | ORAL_CAPSULE | Freq: Two times a day (BID) | ORAL | Status: DC

## 2012-03-26 NOTE — Telephone Encounter (Signed)
Sent dr.hoxworth email threw epic to inform the him to have the patient's port a cath removed

## 2012-03-26 NOTE — Progress Notes (Signed)
Hematology and Oncology Follow Up Visit  Kelsey Wilson [id number] Oct 14, 1960 51 y.o. 12/27/11   HPI: 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 Completion 4/4 planned neoadjuvant dose dense FEC, after delay of cycle 4 due to herpes zoster of the left buttock and left upper thigh region. Neulasta support planned on and in in day 2. Excellent radiographic response per MRI obtained on 09/05/2011. Completion 4 cycles of neoadjuvant dose-dense Taxotere,  left lumpectomy with axillary node dissection  no residual disease either tumor bed or lymph nodes.  2  History herpes zoster of the left buttock and left upper thigh region.  3. Symptoms suggesting postherpetic neuralgia, gabapentin 100 mg by mouth 3 times a day, improvement.  Interim History:   She is completing radiation will be completing this on October 28. She continues to have trouble with neuropathy and is on gabapentin 300 mg 3 times a day was still visit ongoing problems. She has difficulty sleeping because of hot flashes. She also has some issues lymphedema which is an issue for her she is using her lymphedema sleeve quite routinely.   Remainder of the 10 point  review of systems is negative.  Is scheduled to begin radiation therapy Monday.   Medications:   I have reviewed the patient's current medications.  Allergies:  Allergies  Allergen Reactions  . Compazine Other (See Comments)    Numbness of face and lips  . Aleve (Naproxen) Nausea Only  . Penicillins Nausea Only    Physical Exam: Filed Vitals:   03/26/12 1459  BP: 136/84  Pulse: 71  Temp: 97.8 F (36.6 C)  Resp: 20  Weight: 222 lbs. General: Well developed, well nourished, in no acute distress.  Musculoskeletal: Decreased ROM left upper extremity. Extremities: Evidence of nail loss on all fingertips.  No evidence  of erythema or exudate at this time.  EENT: No ocular or oral lesions. No stomatitis.  Respiratory: Lungs are clear to auscultation bilaterally with normal respiratory movement and no accessory muscle use. Cardiac: No murmur, rub or tachycardia. No upper or lower extremity edema.  GI: Abdomen is soft, no palpable hepatosplenomegaly. No fluid wave. No tenderness. Lymph: No cervical, infraclavicular, axillary or inguinal adenopathy. Neuro: No focal neurological deficits. Psych: Alert and oriented X 3, appropriate mood and affect.  Breasts; left breast is status post lumpectomy and ongoing radiation pigmentation changes are seen.  Lab Results: Lab Results  Component Value Date   WBC 4.0 02/11/2012   HGB 11.6* 02/11/2012   HCT 34.7* 02/11/2012   MCV 81.6 02/11/2012   PLT 196 02/11/2012   NEUTROABS 1.5* 02/11/2012     Assessment:  [name] is a 51 year old [place] woman with a locally advanced clinical T3 N1 left breast carcinoma, completion 4 cycles of neoadjuvant dose-dense Taxotere,  left lumpectomy with axillary node dissection revealing pathological complete response .  Course been complicated by peripheral neuropathy on gabapentin with minimal improvement Lymphedema left arm Radiation therapy left breast pending completion  Plan: Begin lyrica, wean gabapentin Referral for port removal Assess bone density, ovarian functrion. Followup in a month   Ethyn Schetter, FNP-C

## 2012-03-27 ENCOUNTER — Ambulatory Visit

## 2012-03-27 ENCOUNTER — Ambulatory Visit: Admitting: Physical Therapy

## 2012-03-27 ENCOUNTER — Ambulatory Visit
Admission: RE | Admit: 2012-03-27 | Discharge: 2012-03-27 | Disposition: A | Discharge status: Home / Self Care | Source: Ambulatory Visit | Attending: Radiation Oncology | Admitting: Radiation Oncology

## 2012-03-28 ENCOUNTER — Ambulatory Visit

## 2012-03-28 ENCOUNTER — Ambulatory Visit
Admission: RE | Admit: 2012-03-28 | Discharge: 2012-03-28 | Disposition: A | Discharge status: Home / Self Care | Source: Ambulatory Visit | Attending: Radiation Oncology | Admitting: Radiation Oncology

## 2012-03-31 ENCOUNTER — Ambulatory Visit
Admission: RE | Admit: 2012-03-31 | Discharge: 2012-03-31 | Disposition: A | Discharge status: Home / Self Care | Source: Ambulatory Visit | Attending: Radiation Oncology | Admitting: Radiation Oncology

## 2012-03-31 ENCOUNTER — Encounter: Payer: Self-pay | Admitting: Radiation Oncology

## 2012-03-31 ENCOUNTER — Ambulatory Visit

## 2012-03-31 VITALS — BP 120/71 | HR 77 | Temp 98.2°F | Resp 20 | Wt 217.8 lb

## 2012-03-31 DIAGNOSIS — C50419 Malignant neoplasm of upper-outer quadrant of unspecified female breast: Secondary | ICD-10-CM

## 2012-03-31 NOTE — Progress Notes (Signed)
  Radiation Oncology         [phone] ________________________________  Name: Kelsey Wilson MRN: [id number]  Date: 02/08/2012  DOB: July 09, 1960  Simulation Verification Note  Status: outpatient  NARRATIVE: The patient was brought to the treatment unit and placed in the planned treatment position. The clinical setup was verified. Then port films were obtained and uploaded to the radiation oncology medical record software.  The treatment beams were carefully compared against the planned radiation fields. The position location and shape of the radiation fields was reviewed. The targeted volume of tissue appears appropriately covered by the radiation beams. Organs at risk appear to be excluded as planned.  Based on my personal review, I approved the simulation verification. The patient's treatment will proceed as planned.  ------------------------------------------------  Lurline Hare, MD

## 2012-03-31 NOTE — Progress Notes (Signed)
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   She had headaches and her imaging was negative for metastatic disease.  She has the expected skin toxicity.   Plan: The patient has completed radiation treatment. The patient will return to radiation oncology clinic for routine followup in one month. I advised them to call or return sooner if they have any questions or concerns related to their recovery or treatment.  ------------------------------------------------  Lurline Hare, MD

## 2012-03-31 NOTE — Progress Notes (Signed)
   Weekly Management Note Completed Radiotherapy. Total Dose:  61Gy   Narrative:  The patient presents for routine under treatment assessment on last day of radiotherapy.  CBCT/MVCT images/Port film x-rays were reviewed.  The chart was checked. She is doing relatively well. Minimal complaints other than skin irritation related to her radiotherapy. She was also started Lyrica for peripheral neuropathy. She reports using Biafine.   Physical Findings:  weight is 217 lb 12.8 oz (98.793 kg). Her oral temperature is 98.2 F (36.8 C). Her blood pressure is 120/71 and her pulse is 77. Her respiration is 20.  Diffuse hyperpigmentation with dry desquamation in patches over the left breast and lymph node regions.  Impression:  The patient has tolerated radiotherapy.  Plan:  Routine follow-up in one month. ________________________________   Lonie Peak, M.D.

## 2012-03-31 NOTE — Progress Notes (Signed)
Name: Kelsey Wilson   MRN: [id number]  Date:  03/14/2012    DOB: Dec 13, 1960  Status:outpatient    DIAGNOSIS: Breast cancer.  CONSENT VERIFIED: yes   SET UP: Patient is setup supine   IMMOBILIZATION:  The following immobilization was used:Custom Moldable Pillow, breast board.   NARRATIVE: Kelsey Wilson underwent complex simulation and treatment planning for her boost treatment today.  Her tumor volume was outlined on the planning CT scan. The depth of her cavity was measured.    18  MeV electrons will be prescribed to the 95% Isodose line.   A block will be used for beam modification purposes.  A special port plan is requested.

## 2012-03-31 NOTE — Progress Notes (Signed)
Patient here for weekly rad txs,completed today, 33/33, left breast , tanning, has peeled in areas under supraclavicular,under axilla and under fold of breast,all dry, healing, no moistness, started on lyrica last week for peripheral neuropathy in hands and feet, alert,oriented x3, patient in greatspirits, 10:04 AM

## 2012-04-02 ENCOUNTER — Ambulatory Visit: Admitting: Physical Therapy

## 2012-04-07 ENCOUNTER — Ambulatory Visit: Attending: Physician Assistant | Admitting: Physical Therapy

## 2012-04-07 DIAGNOSIS — C50919 Malignant neoplasm of unspecified site of unspecified female breast: Secondary | ICD-10-CM | POA: Insufficient documentation

## 2012-04-07 DIAGNOSIS — IMO0001 Reserved for inherently not codable concepts without codable children: Secondary | ICD-10-CM | POA: Insufficient documentation

## 2012-04-07 DIAGNOSIS — M24519 Contracture, unspecified shoulder: Secondary | ICD-10-CM | POA: Insufficient documentation

## 2012-04-07 DIAGNOSIS — I890 Lymphedema, not elsewhere classified: Secondary | ICD-10-CM | POA: Insufficient documentation

## 2012-04-07 DIAGNOSIS — M25519 Pain in unspecified shoulder: Secondary | ICD-10-CM | POA: Insufficient documentation

## 2012-04-09 ENCOUNTER — Ambulatory Visit
Admission: RE | Admit: 2012-04-09 | Discharge: 2012-04-09 | Disposition: A | Discharge status: Home / Self Care | Source: Ambulatory Visit | Attending: Oncology | Admitting: Oncology

## 2012-04-09 DIAGNOSIS — C50419 Malignant neoplasm of upper-outer quadrant of unspecified female breast: Secondary | ICD-10-CM

## 2012-04-10 ENCOUNTER — Other Ambulatory Visit (HOSPITAL_BASED_OUTPATIENT_CLINIC_OR_DEPARTMENT_OTHER): Admitting: Lab

## 2012-04-10 ENCOUNTER — Telehealth: Payer: Self-pay | Admitting: Oncology

## 2012-04-10 ENCOUNTER — Ambulatory Visit (HOSPITAL_BASED_OUTPATIENT_CLINIC_OR_DEPARTMENT_OTHER): Admitting: Oncology

## 2012-04-10 VITALS — BP 116/71 | HR 81 | Temp 97.7°F | Resp 20 | Ht 63.0 in | Wt 213.1 lb

## 2012-04-10 DIAGNOSIS — G5690 Unspecified mononeuropathy of unspecified upper limb: Secondary | ICD-10-CM

## 2012-04-10 DIAGNOSIS — C50419 Malignant neoplasm of upper-outer quadrant of unspecified female breast: Secondary | ICD-10-CM

## 2012-04-10 DIAGNOSIS — Z170 Estrogen receptor positive status [ER+]: Secondary | ICD-10-CM

## 2012-04-10 DIAGNOSIS — G5790 Unspecified mononeuropathy of unspecified lower limb: Secondary | ICD-10-CM

## 2012-04-10 LAB — COMPREHENSIVE METABOLIC PANEL (CC13)
ALT: 10 U/L (ref 0–55)
AST: 14 U/L (ref 5–34)
Albumin: 3.5 g/dL (ref 3.5–5.0)
Alkaline Phosphatase: 70 U/L (ref 40–150)
BUN: 16 mg/dL (ref 7.0–26.0)
CO2: 29 mEq/L (ref 22–29)
Calcium: 9.6 mg/dL (ref 8.4–10.4)
Chloride: 103 mEq/L (ref 98–107)
Creatinine: 0.7 mg/dL (ref 0.6–1.1)
Glucose: 105 mg/dl — ABNORMAL HIGH (ref 70–99)
Potassium: 3.8 mEq/L (ref 3.5–5.1)
Sodium: 137 mEq/L (ref 136–145)
Total Bilirubin: 1.03 mg/dL (ref 0.20–1.20)
Total Protein: 6.6 g/dL (ref 6.4–8.3)

## 2012-04-10 LAB — CBC WITH DIFFERENTIAL/PLATELET
BASO%: 0.3 % (ref 0.0–2.0)
Basophils Absolute: 0 10*3/uL (ref 0.0–0.1)
EOS%: 1.5 % (ref 0.0–7.0)
Eosinophils Absolute: 0 10*3/uL (ref 0.0–0.5)
HCT: 30.9 % — ABNORMAL LOW (ref 34.8–46.6)
HGB: 10.6 g/dL — ABNORMAL LOW (ref 11.6–15.9)
LYMPH%: 18.6 % (ref 14.0–49.7)
MCH: 28.2 pg (ref 25.1–34.0)
MCHC: 34.2 g/dL (ref 31.5–36.0)
MCV: 82.4 fL (ref 79.5–101.0)
MONO#: 0.4 10*3/uL (ref 0.1–0.9)
MONO%: 11.3 % (ref 0.0–14.0)
NEUT#: 2.2 10*3/uL (ref 1.5–6.5)
NEUT%: 68.3 % (ref 38.4–76.8)
Platelets: 187 10*3/uL (ref 145–400)
RBC: 3.75 10*6/uL (ref 3.70–5.45)
RDW: 17 % — ABNORMAL HIGH (ref 11.2–14.5)
WBC: 3.3 10*3/uL — ABNORMAL LOW (ref 3.9–10.3)
lymph#: 0.6 10*3/uL — ABNORMAL LOW (ref 0.9–3.3)

## 2012-04-10 LAB — LACTATE DEHYDROGENASE (CC13): LDH: 172 U/L (ref 125–245)

## 2012-04-10 MED ORDER — CITALOPRAM HYDROBROMIDE 20 MG PO TABS: 20.0000 mg | ORAL_TABLET | Freq: Every day | ORAL | Status: DC

## 2012-04-10 MED ORDER — ANASTROZOLE 1 MG PO TABS: 1.0000 mg | ORAL_TABLET | Freq: Every day | ORAL | Status: DC

## 2012-04-10 NOTE — Telephone Encounter (Signed)
gve the pt her dec 2013 appt calendar. Pt is aware that she will be contacted with the appts for the neurologist along with the physical therapy appt

## 2012-04-10 NOTE — Progress Notes (Signed)
Hematology and Oncology Follow Up Visit  Kelsey Wilson [id number] 1960/12/19 51 y.o. 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 Completion 4/4 planned neoadjuvant dose dense FEC, after delay of cycle 4 due to herpes zoster of the left buttock and left upper thigh region. Neulasta support planned on and in in day 2. Excellent radiographic response per MRI obtained on 09/05/2011. Completion 4 cycles of neoadjuvant dose-dense Taxotere,  left lumpectomy with axillary node dissection  no residual disease either tumor bed or lymph nodes, xrt completed 10/28..  2  History herpes zoster of the left buttock and left upper thigh region.    Interim History:   Patient returns for followup. She completed radiation on 10/28. She has ongoing problem painful neuropathy involving both hands or feet. She is nothing Lyrica with some improvement but the pain persists. She does take some of the pain medication. Pain is not to say worse at night. She is able to sleep. Functionally she is losing her ability to use her hands effectively. She is almost  has no ability to use her fingers. For breast cancer point of view. She did have a DEXA scan which was normal. She is due for followup mammogram after December 18. She also has a tumor that was released my a minimally ER PR positive at 8 and 9% respectively   Remainder of the 10 point  review of systems is negative.     Medications:   I have reviewed the patient's current medications.  Allergies:  Allergies  Allergen Reactions  . Compazine Other (See Comments)    Numbness of face and lips  . Aleve (Naproxen) Nausea Only  . Penicillins Nausea Only    Physical Exam: Filed Vitals:   04/10/12 1157  BP: 116/71  Pulse: 81  Temp: 97.7 F (36.5 C)  Resp: 20  Weight: 222 lbs. General: Well  developed, well nourished, in no acute distress.  Musculoskeletal: Decreased ROM left upper extremity. Extremities: Evidence of nail loss on all fingertips.  No evidence of erythema or exudate at this time.  EENT: No ocular or oral lesions. No stomatitis.  Respiratory: Lungs are clear to auscultation bilaterally with normal respiratory movement and no accessory muscle use. Cardiac: No murmur, rub or tachycardia. No upper or lower extremity edema.  GI: Abdomen is soft, no palpable hepatosplenomegaly. No fluid wave. No tenderness. Lymph: No cervical, infraclavicular, axillary or inguinal adenopathy. Neuro: She has evidence of muscle atrophy in both her hands. She has severe weakness in both the intrinsic muscles of both hands. Possible muscles are more intact. Psych: Alert and oriented X 3, appropriate mood and affect.  Breasts; left breast is status post lumpectomy and ongoing radiation pigmentation changes are seen.  Lab Results: Lab Results  Component Value Date   WBC 3.3* 04/10/2012   HGB 10.6* 04/10/2012   HCT 30.9* 04/10/2012   MCV 82.4 04/10/2012   PLT 187 04/10/2012   NEUTROABS 2.2 04/10/2012     Assessment:  Kelsey Wilson is a 51 year old [place] woman with a locally advanced clinical T3 N1 left breast carcinoma, completion 4 cycles of neoadjuvant dose-dense Taxotere,  left lumpectomy with axillary node dissection revealing pathological complete response .  Course been complicated by peripheral neuropathy on gabapentin with minimal improvement   Plan:  I discussed the overall plan with her. I  think it's aggressive physical and occupational therapy. She needs a referral to neurology. Her on Celexa to see this will help neuropathy. We will look forward to follow mammogram and I will start her on Arimidex as well. She has hormone that testing pending. I suspect she'll be postmenopausal. I discussed side effects with her. She has severe neuropathy now affecting muscle  function and both hands. This has started essentially after she completed her last cycle of Taxotere. And has not improved with her Mnire's medications. I think at this point I would hope to will hold off on any further deterioration the muscle function. I've advised her to get a softball that she can squeeze multiple times during the day 2 retain and improve her muscle function. I will plan to see her in a month's time. Pierce Crane, MD

## 2012-04-10 NOTE — Telephone Encounter (Signed)
S/w nancy from the physical therapy dept and she wanted me to fax over the work orders and she will call the pt with an appt.

## 2012-04-11 ENCOUNTER — Telehealth: Payer: Self-pay | Admitting: Oncology

## 2012-04-11 LAB — FOLLICLE STIMULATING HORMONE: FSH: 31.3 m[IU]/mL

## 2012-04-11 LAB — VITAMIN D 25 HYDROXY (VIT D DEFICIENCY, FRACTURES): Vit D, 25-Hydroxy: 14 ng/mL — ABNORMAL LOW (ref 30–89)

## 2012-04-11 NOTE — Telephone Encounter (Signed)
gve a copy of the referral form to  Medical records for them to fiill senfd over the requested documents for this pt's appt at [facility] neurology. Pt is aware she will be contacted with an appt.

## 2012-04-14 ENCOUNTER — Encounter: Payer: Self-pay | Admitting: *Deleted

## 2012-04-14 NOTE — Progress Notes (Signed)
Called and spoke with patient concerning her Vitamin D level.  Per patient she is not taking any Vitamin D.  Instructed her per Dr. Donnie Coffin to take 2000 units Vitamin D3 daily.  Patient verbalized understanding.

## 2012-04-15 ENCOUNTER — Other Ambulatory Visit: Payer: Self-pay | Admitting: Oncology

## 2012-04-15 DIAGNOSIS — Z853 Personal history of malignant neoplasm of breast: Secondary | ICD-10-CM

## 2012-04-16 LAB — ESTRADIOL, ULTRA SENS: Estradiol, Ultra Sensitive: 9 pg/mL

## 2012-04-21 DIAGNOSIS — Z452 Encounter for adjustment and management of vascular access device: Secondary | ICD-10-CM

## 2012-04-24 ENCOUNTER — Other Ambulatory Visit: Payer: Self-pay | Admitting: Family

## 2012-04-25 ENCOUNTER — Ambulatory Visit
Admission: RE | Admit: 2012-04-25 | Discharge: 2012-04-25 | Disposition: A | Discharge status: Home / Self Care | Source: Ambulatory Visit | Attending: Radiation Oncology | Admitting: Radiation Oncology

## 2012-04-25 ENCOUNTER — Encounter: Payer: Self-pay | Admitting: Radiation Oncology

## 2012-04-25 VITALS — BP 125/80 | HR 73 | Temp 97.5°F | Resp 18 | Wt 212.8 lb

## 2012-04-25 DIAGNOSIS — C50419 Malignant neoplasm of upper-outer quadrant of unspecified female breast: Secondary | ICD-10-CM

## 2012-04-25 NOTE — Progress Notes (Signed)
   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  She has been seen by neurology for her neuropathy symptoms. She continues to have headaches. Lymphedema continues. She's been using lotion of her left breast and feels her breasts was returned to its normal color. She has an upcoming appointment Dr. Donnie Coffin on December 9. She is tolerating her Arimidex well.  Allergies:  Allergies  Allergen Reactions  . Compazine Other (See Comments)    Numbness of face and lips  . Aleve (Naproxen) Nausea Only  . Penicillins Nausea Only    Medications:  Current Outpatient Prescriptions  Medication Sig Dispense Refill  . anastrozole (ARIMIDEX) 1 MG tablet Take 1 tablet (1 mg total) by mouth daily.  30 tablet  6  . citalopram (CELEXA) 20 MG tablet Take 1 tablet (20 mg total) by mouth daily.  30 tablet  2  . gabapentin (NEURONTIN) 300 MG capsule Take 300 mg by mouth 3 (three) times daily.       . hyaluronate sodium (RADIAPLEXRX) GEL Apply topically 2 (two) times daily.      Marland Kitchen HYDROcodone-acetaminophen (VICODIN) 5-500 MG per tablet Take 1-2 tablets by mouth every 6 (six) hours as needed. For pain      . lidocaine-prilocaine (EMLA) cream Apply 1 application topically daily as needed. Use 1 to 2 hours prior to treatment.      Marland Kitchen LORazepam (ATIVAN) 0.5 MG tablet Take 0.5 mg by mouth every 6 (six) hours as needed. For anxiety.      . pregabalin (LYRICA) 75 MG capsule Take 1 capsule (75 mg total) by mouth 2 (two) times daily.  60 capsule  3  . traMADol (ULTRAM) 50 MG tablet Take 1 tablet (50 mg total) by mouth every 6 (six) hours as needed. Pain  30 tablet  0    Physical Exam:   weight is 212 lb 12.8  oz (96.525 kg). Her oral temperature is 97.5 F (36.4 C). Her blood pressure is 125/80 and her pulse is 73. Her respiration is 18.  She has hyperpigmentation over her left supraclavicular fossa in the lateral aspect of her left breast. Otherwise her skin is healed up well.   IMPRESSION: Kelsey Wilson is a 51 y.o. female status post radiation after breast conservation with resolving acute effects of treatment  PLAN:  She looks great. I urged her to continue using lotion and vitamin E. We discussed the use of sun protection if she was going to be out in the sun for prolonged period of time. I've also encouraged her to continue her regular scheduled followup with Dr. Velna Ochs is [place] and Dr. Donnie Coffin. I have not scheduled followup with me. She has mammogram scheduled in December    Lurline Hare, MD

## 2012-04-25 NOTE — Progress Notes (Signed)
Patient presents to the clinic today unaccompanied for follow up appointment with Dr. Michell Heinrich. Patient is alert and oriented to person, place, and time. No distress noted. Steady gait noted with assistance cane. Pleasant affect noted. Patient denies pain at this time. Neuropathy continue in hands and feet. Seen by neurologist yesterday. Lymphedema continues left arm. To resume PT next month. Patient reports skin of left breast has return to normal color and appearance. Patient reports using lubriderm lotion on this area. Patient reports fatigue continues. Reported all findings to Dr. Michell Heinrich.

## 2012-04-29 ENCOUNTER — Other Ambulatory Visit: Payer: Self-pay | Admitting: *Deleted

## 2012-04-29 ENCOUNTER — Telehealth: Payer: Self-pay | Admitting: *Deleted

## 2012-04-29 DIAGNOSIS — F419 Anxiety disorder, unspecified: Secondary | ICD-10-CM

## 2012-04-29 DIAGNOSIS — C50919 Malignant neoplasm of unspecified site of unspecified female breast: Secondary | ICD-10-CM

## 2012-04-29 MED ORDER — TRAMADOL HCL 50 MG PO TABS: 50.0000 mg | ORAL_TABLET | Freq: Four times a day (QID) | ORAL | Status: DC | PRN

## 2012-04-29 MED ORDER — LORAZEPAM 0.5 MG PO TABS: 0.5000 mg | ORAL_TABLET | Freq: Four times a day (QID) | ORAL | Status: DC | PRN

## 2012-04-29 NOTE — Telephone Encounter (Signed)
Pt called requesting resources and assistance with disability paperwork.  Sent applications for financial assistance to verified home address.  Informed Abigail CSW of pt need.  Pt denies further needs at this time.  Contact information given.

## 2012-04-30 ENCOUNTER — Encounter (INDEPENDENT_AMBULATORY_CARE_PROVIDER_SITE_OTHER): Payer: Self-pay | Admitting: Surgery

## 2012-04-30 ENCOUNTER — Ambulatory Visit (INDEPENDENT_AMBULATORY_CARE_PROVIDER_SITE_OTHER): Admitting: Surgery

## 2012-04-30 VITALS — BP 112/64 | HR 75 | Temp 97.4°F | Ht 62.5 in | Wt 218.0 lb

## 2012-04-30 DIAGNOSIS — I890 Lymphedema, not elsewhere classified: Secondary | ICD-10-CM

## 2012-04-30 NOTE — Patient Instructions (Signed)
Follow-up in 1-2 weeks with Dr. Johna Sheriff

## 2012-04-30 NOTE — Progress Notes (Signed)
This is a patient of Dr Johna Sheriff who is status post left lumpectomy and axillary dissection on 11/28/11. She had her port removed last week. She has had lymphedema in her left arm for the last several months. She does wear a sleeve on occasion. She had been doing physical therapy but stopped it recently the to neuropathy in her arm. Last few days she feels that the area under her left axilla and in her upper left arm has become more pronounced. She comes to the urgent office today for evaluation.  Her axillary incision is well-healed with no sign of infection. No underlying seroma. Her lumpectomy incision is also healed. She does have some slight lymphedema in her upper arm. She has obvious weakness in her arm which she says is unchanged.  There is no sign of acute infection or seroma that needs to be drained. Having never met this patient before, I think that this only represents her persistent lymphedema. We will have her come see Dr. Johna Sheriff next week to get his opinion. She may need to resume her physical therapy.  Wilmon Arms. Corliss Skains, MD, [facility]  04/30/2012 5:08 PM

## 2012-05-05 ENCOUNTER — Other Ambulatory Visit: Admitting: Lab

## 2012-05-05 ENCOUNTER — Ambulatory Visit: Admitting: Oncology

## 2012-05-05 ENCOUNTER — Ambulatory Visit: Attending: Oncology | Admitting: Physical Therapy

## 2012-05-05 DIAGNOSIS — IMO0001 Reserved for inherently not codable concepts without codable children: Secondary | ICD-10-CM | POA: Insufficient documentation

## 2012-05-05 DIAGNOSIS — M24519 Contracture, unspecified shoulder: Secondary | ICD-10-CM | POA: Insufficient documentation

## 2012-05-05 DIAGNOSIS — M25519 Pain in unspecified shoulder: Secondary | ICD-10-CM | POA: Insufficient documentation

## 2012-05-05 DIAGNOSIS — I890 Lymphedema, not elsewhere classified: Secondary | ICD-10-CM | POA: Insufficient documentation

## 2012-05-05 DIAGNOSIS — C50919 Malignant neoplasm of unspecified site of unspecified female breast: Secondary | ICD-10-CM | POA: Insufficient documentation

## 2012-05-08 ENCOUNTER — Ambulatory Visit (INDEPENDENT_AMBULATORY_CARE_PROVIDER_SITE_OTHER): Admitting: General Surgery

## 2012-05-08 ENCOUNTER — Encounter (INDEPENDENT_AMBULATORY_CARE_PROVIDER_SITE_OTHER): Payer: Self-pay | Admitting: General Surgery

## 2012-05-08 VITALS — BP 132/70 | HR 72 | Resp 18 | Ht 62.5 in | Wt 216.0 lb

## 2012-05-08 DIAGNOSIS — C50419 Malignant neoplasm of upper-outer quadrant of unspecified female breast: Secondary | ICD-10-CM

## 2012-05-08 DIAGNOSIS — I890 Lymphedema, not elsewhere classified: Secondary | ICD-10-CM

## 2012-05-08 NOTE — Patient Instructions (Addendum)
Continue followup at lymphedema clinic

## 2012-05-08 NOTE — Progress Notes (Signed)
Chief complaint puffiness left arm  History: This returns for follow up with history of lumpectomy and axillary dissection following neoadjuvant treatment for T3 N1 carcinoma left breast. She completed her radiation about a month ago. She had previously noted some mild lymphedema and has been to the lymphedema clinic. She has compression garments. She had not been using these recently. She notices some heaviness and puffiness along the lateral aspect of her left breast and upper left arm but not the hand or wrist. She also has neuropathy which bothers all extremities.  Exam: Gen.: Obese but otherwise well-appearing Breasts: Well-healed lumpectomy incision. Mild to moderate postradiation changes. Axillary incision looks fine without seroma or other complication Extremities: There is not any obvious swelling of the left arm. The forearm measurements are equal at 29 cm but the mid upper arm is 3 cm greater on the left at 39 versus 36 on the right.  Assessment and plan: Apparent mild lymphedema. She already has an appointment at the physical therapist's next week regarding her neuropathy as well as lymphedema. I do not see any surgical complications at this point. She has an appointment to see me in several months.

## 2012-05-09 ENCOUNTER — Ambulatory Visit: Admitting: Physical Therapy

## 2012-05-09 ENCOUNTER — Telehealth: Payer: Self-pay | Admitting: Oncology

## 2012-05-09 ENCOUNTER — Other Ambulatory Visit: Payer: Self-pay | Admitting: *Deleted

## 2012-05-09 DIAGNOSIS — C50419 Malignant neoplasm of upper-outer quadrant of unspecified female breast: Secondary | ICD-10-CM

## 2012-05-09 NOTE — Telephone Encounter (Signed)
Pt stopped by asking for paperwork to be filled out for the [place] de[employer] of health and human services for a personal care attendant.   MD signed and paper returned to patient with breast cancer diagnosis and lymph edema diagnosis indicating need for an attendant.   Pt had no further requests or questions.

## 2012-05-12 ENCOUNTER — Ambulatory Visit (HOSPITAL_BASED_OUTPATIENT_CLINIC_OR_DEPARTMENT_OTHER): Admitting: Oncology

## 2012-05-12 ENCOUNTER — Telehealth: Payer: Self-pay | Admitting: *Deleted

## 2012-05-12 ENCOUNTER — Other Ambulatory Visit (HOSPITAL_BASED_OUTPATIENT_CLINIC_OR_DEPARTMENT_OTHER): Admitting: Lab

## 2012-05-12 VITALS — BP 126/80 | HR 71 | Temp 97.7°F | Resp 20 | Ht 62.5 in | Wt 218.9 lb

## 2012-05-12 DIAGNOSIS — Z170 Estrogen receptor positive status [ER+]: Secondary | ICD-10-CM

## 2012-05-12 DIAGNOSIS — G609 Hereditary and idiopathic neuropathy, unspecified: Secondary | ICD-10-CM

## 2012-05-12 DIAGNOSIS — C50419 Malignant neoplasm of upper-outer quadrant of unspecified female breast: Secondary | ICD-10-CM

## 2012-05-12 DIAGNOSIS — F419 Anxiety disorder, unspecified: Secondary | ICD-10-CM

## 2012-05-12 DIAGNOSIS — R252 Cramp and spasm: Secondary | ICD-10-CM

## 2012-05-12 DIAGNOSIS — C50919 Malignant neoplasm of unspecified site of unspecified female breast: Secondary | ICD-10-CM

## 2012-05-12 LAB — CBC WITH DIFFERENTIAL/PLATELET
BASO%: 0.6 % (ref 0.0–2.0)
Basophils Absolute: 0 10*3/uL (ref 0.0–0.1)
EOS%: 4.6 % (ref 0.0–7.0)
Eosinophils Absolute: 0.1 10*3/uL (ref 0.0–0.5)
HCT: 33.7 % — ABNORMAL LOW (ref 34.8–46.6)
HGB: 11.4 g/dL — ABNORMAL LOW (ref 11.6–15.9)
LYMPH%: 39.4 % (ref 14.0–49.7)
MCH: 28.7 pg (ref 25.1–34.0)
MCHC: 33.7 g/dL (ref 31.5–36.0)
MCV: 85.1 fL (ref 79.5–101.0)
MONO#: 0.3 10*3/uL (ref 0.1–0.9)
MONO%: 12.6 % (ref 0.0–14.0)
NEUT#: 1 10*3/uL — ABNORMAL LOW (ref 1.5–6.5)
NEUT%: 42.8 % (ref 38.4–76.8)
Platelets: 172 10*3/uL (ref 145–400)
RBC: 3.96 10*6/uL (ref 3.70–5.45)
RDW: 16.2 % — ABNORMAL HIGH (ref 11.2–14.5)
WBC: 2.2 10*3/uL — ABNORMAL LOW (ref 3.9–10.3)
lymph#: 0.9 10*3/uL (ref 0.9–3.3)

## 2012-05-12 LAB — COMPREHENSIVE METABOLIC PANEL (CC13)
ALT: 17 U/L (ref 0–55)
AST: 17 U/L (ref 5–34)
Albumin: 3.3 g/dL — ABNORMAL LOW (ref 3.5–5.0)
Alkaline Phosphatase: 67 U/L (ref 40–150)
BUN: 19 mg/dL (ref 7.0–26.0)
CO2: 28 mEq/L (ref 22–29)
Calcium: 9.6 mg/dL (ref 8.4–10.4)
Chloride: 103 mEq/L (ref 98–107)
Creatinine: 1 mg/dL (ref 0.6–1.1)
Glucose: 102 mg/dl — ABNORMAL HIGH (ref 70–99)
Potassium: 4.2 mEq/L (ref 3.5–5.1)
Sodium: 140 mEq/L (ref 136–145)
Total Bilirubin: 0.89 mg/dL (ref 0.20–1.20)
Total Protein: 6.4 g/dL (ref 6.4–8.3)

## 2012-05-12 LAB — LACTATE DEHYDROGENASE (CC13): LDH: 188 U/L (ref 125–245)

## 2012-05-12 MED ORDER — PERIDIN-C 200-50-150 MG PO TABS: 200.0000 mg | ORAL_TABLET | Freq: Three times a day (TID) | ORAL | Status: DC

## 2012-05-12 MED ORDER — LORAZEPAM 0.5 MG PO TABS: 0.5000 mg | ORAL_TABLET | Freq: Four times a day (QID) | ORAL | Status: DC | PRN

## 2012-05-12 MED ORDER — BACLOFEN 10 MG PO TABS: 10.0000 mg | ORAL_TABLET | Freq: Three times a day (TID) | ORAL | Status: DC

## 2012-05-12 NOTE — Telephone Encounter (Signed)
Gave patient appointment for 08-2012  

## 2012-05-12 NOTE — Progress Notes (Signed)
Hematology and Oncology Follow Up Visit  Kelsey Wilson [id number] 06-19-1960 51 y.o. 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 Completion 4/4 planned neoadjuvant dose dense FEC, after delay of cycle 4 due to herpes zoster of the left buttock and left upper thigh region. Neulasta support planned on and in in day 2. Excellent radiographic response per MRI obtained on 09/05/2011. Completion 4 cycles of neoadjuvant dose-dense Taxotere,  left lumpectomy with axillary node dissection  no residual disease either tumor bed or lymph nodes, xrt completed 10/28..  2  History herpes zoster of the left buttock and left upper thigh region.    Interim History:   Patient returns for followup. She completed radiation on 10/28. She has ongoing problem painful neuropathy involving both hands or feet. She had a nerve conduction study done which showed bilateral carpal tunnel syndrome. She has f/u with neurology 12/16. The lyrica and celexa seem to be helping. She is looking to get her disability approved. She has muscle cramps at night. Her estradiol is in the post menopausal range.   Remainder of the 10 point  review of systems is negative.     Medications:   I have reviewed the patient's current medications.  Allergies:  Allergies  Allergen Reactions  . Compazine Other (See Comments)    Numbness of face and lips  . Aleve (Naproxen) Nausea Only  . Penicillins Nausea Only    Physical Exam: Filed Vitals:   05/12/12 0920  BP: 126/80  Pulse: 71  Temp: 97.7 F (36.5 C)  Resp: 20  Weight: 222 lbs. General: Well developed, well nourished, in no acute distress.  Musculoskeletal: Decreased ROM left upper extremity. Extremities: Evidence of nail loss on all fingertips.  No evidence of erythema or exudate at this time.  EENT: No ocular  or oral lesions. No stomatitis.  Respiratory: Lungs are clear to auscultation bilaterally with normal respiratory movement and no accessory muscle use. Cardiac: No murmur, rub or tachycardia. No upper or lower extremity edema.  GI: Abdomen is soft, no palpable hepatosplenomegaly. No fluid wave. No tenderness. Lymph: No cervical, infraclavicular, axillary or inguinal adenopathy. Neuro: She has evidence of muscle atrophy in both her hands. She has severe weakness in both the intrinsic muscles of both hands. Possible muscles are more intact. Psych: Alert and oriented X 3, appropriate mood and affect.  Breasts; left breast is status post lumpectomy and ongoing radiation pigmentation changes are seen.  Lab Results: Lab Results  Component Value Date   WBC 2.2* 05/12/2012   HGB 11.4* 05/12/2012   HCT 33.7* 05/12/2012   MCV 85.1 05/12/2012   PLT 172 05/12/2012   NEUTROABS 1.0* 05/12/2012     Assessment:  Kelsey Wilson is a 51 year old [place] woman with a locally advanced clinical T3 N1 left breast carcinoma, completion 4 cycles of neoadjuvant dose-dense Taxotere,  left lumpectomy with axillary node dissection revealing pathological complete response .  Course been complicated by peripheral neuropathy on gabapentin with minimal improvement   Plan:  I discussed the overall plan with her. She will likely need a carpal tunnel release. I have ordered peridin c and baclofen for hot flashes and cramps respectively.  I will see her in 3 months. We will gladly approve her disability.   Pierce Crane, MD

## 2012-05-13 ENCOUNTER — Ambulatory Visit: Admitting: Physical Therapy

## 2012-05-15 ENCOUNTER — Ambulatory Visit: Admitting: Physical Therapy

## 2012-05-15 ENCOUNTER — Other Ambulatory Visit: Payer: Self-pay | Admitting: Oncology

## 2012-05-15 DIAGNOSIS — C50919 Malignant neoplasm of unspecified site of unspecified female breast: Secondary | ICD-10-CM

## 2012-05-15 MED ORDER — BACLOFEN 10 MG PO TABS: 10.0000 mg | ORAL_TABLET | Freq: Three times a day (TID) | ORAL | Status: DC

## 2012-05-20 ENCOUNTER — Ambulatory Visit: Admitting: Physical Therapy

## 2012-05-22 ENCOUNTER — Ambulatory Visit
Admission: RE | Admit: 2012-05-22 | Discharge: 2012-05-22 | Disposition: A | Discharge status: Home / Self Care | Source: Ambulatory Visit | Attending: Oncology | Admitting: Oncology

## 2012-05-22 DIAGNOSIS — Z853 Personal history of malignant neoplasm of breast: Secondary | ICD-10-CM

## 2012-05-23 ENCOUNTER — Ambulatory Visit

## 2012-05-26 ENCOUNTER — Ambulatory Visit: Admitting: Physical Therapy

## 2012-05-30 ENCOUNTER — Ambulatory Visit: Admitting: Physical Therapy

## 2012-06-02 ENCOUNTER — Other Ambulatory Visit: Payer: Self-pay | Admitting: Oncology

## 2012-06-04 HISTORY — PX: OTHER SURGICAL HISTORY: SHX169

## 2012-06-11 ENCOUNTER — Ambulatory Visit

## 2012-06-13 ENCOUNTER — Ambulatory Visit: Attending: Oncology

## 2012-06-13 DIAGNOSIS — M25519 Pain in unspecified shoulder: Secondary | ICD-10-CM | POA: Insufficient documentation

## 2012-06-13 DIAGNOSIS — M24519 Contracture, unspecified shoulder: Secondary | ICD-10-CM | POA: Insufficient documentation

## 2012-06-13 DIAGNOSIS — C50919 Malignant neoplasm of unspecified site of unspecified female breast: Secondary | ICD-10-CM | POA: Insufficient documentation

## 2012-06-13 DIAGNOSIS — IMO0001 Reserved for inherently not codable concepts without codable children: Secondary | ICD-10-CM | POA: Insufficient documentation

## 2012-06-13 DIAGNOSIS — I890 Lymphedema, not elsewhere classified: Secondary | ICD-10-CM | POA: Insufficient documentation

## 2012-06-16 ENCOUNTER — Telehealth: Payer: Self-pay | Admitting: Emergency Medicine

## 2012-06-16 NOTE — Telephone Encounter (Signed)
Spoke with patient and gave her appointment times for 1/17 at 10:30 lab and 11:00 Dr Welton Flakes.  Patient has appointment with Dr Johna Sheriff that day at 0900 and patient will come to the cancer center immediately afterwards.

## 2012-06-17 ENCOUNTER — Ambulatory Visit

## 2012-06-18 ENCOUNTER — Telehealth: Payer: Self-pay | Admitting: Oncology

## 2012-06-18 NOTE — Telephone Encounter (Signed)
The patient called and requested to be seen by Dr. Darnelle Catalan, and she is currently scheduled with Dr. Welton Flakes.   She is also seeing Dr. Johna Sheriff Friday AM so I suggested that we ask Dr. Johna Sheriff to refer her to the pain clinic and then we will call her soon to schedule her for a follow up here under the care of Dr. Darnelle Catalan.   Kelsey Wilson, PT who has been working with this pt in rehab is very familiar with her care and made the suggestion for the pain clinic referral is here at the [facility] today for clinic so I will ask her to provide info to Dr. Johna Sheriff.  Pt verbalized understanding of the plan.

## 2012-06-19 ENCOUNTER — Encounter: Admitting: Physical Therapy

## 2012-06-20 ENCOUNTER — Ambulatory Visit: Admitting: Oncology

## 2012-06-20 ENCOUNTER — Other Ambulatory Visit: Admitting: Lab

## 2012-06-20 ENCOUNTER — Encounter (INDEPENDENT_AMBULATORY_CARE_PROVIDER_SITE_OTHER): Payer: Self-pay | Admitting: General Surgery

## 2012-06-20 ENCOUNTER — Ambulatory Visit (INDEPENDENT_AMBULATORY_CARE_PROVIDER_SITE_OTHER): Payer: Medicaid Other | Admitting: General Surgery

## 2012-06-20 VITALS — BP 102/64 | HR 66 | Temp 96.7°F | Ht 63.0 in | Wt 217.8 lb

## 2012-06-20 DIAGNOSIS — C50419 Malignant neoplasm of upper-outer quadrant of unspecified female breast: Secondary | ICD-10-CM

## 2012-06-20 NOTE — Progress Notes (Signed)
Chief complaint: Followup breast cancer  History: Patient returns for more long-term followup status post left breast lumpectomy and axillary dissection following neoadjuvant chemotherapy for T3, N1, M0 cancer of the left breast mass status post radiation. Surgery date is 11/29/2011. She continues to have a lot of problems with chronic pain. She completed therapy with physical therapy but continues to have neuropathic type of pain in her hands and feet as well as aching leg pain and pain in her incisions in her breast and Port-A-Cath removal site as well.  Exam: BP 102/64  Pulse 66  Temp 96.7 F (35.9 C) (Temporal)  Ht 5\' 3"  (1.6 m)  Wt 217 lb 12.8 oz (98.793 kg)  BMI 38.58 kg/m2  SpO2 98% General: No distress HEENT: No palpable masses. Sclera nonicteric Lymph nodes: No cervical, subclavicular or inguinal nodes palpable Lungs: Clear equal breath sounds bilaterally Breasts: Moderate postradiation changes on the left. Incisions well healed. No palpable mass or thickening at the lumpectomy site. Right breast is negative to exam.  Assessment and plan: No evidence of recurrent cancer. She continues to have chronic pain issues related to both her chemotherapy and surgery and radiation. Physical therapy has not really helped. As per medical oncology request we are referring her for chronic pain management. I will see her back in 6 months

## 2012-06-24 ENCOUNTER — Encounter

## 2012-06-26 ENCOUNTER — Telehealth: Payer: Self-pay | Admitting: Oncology

## 2012-06-26 ENCOUNTER — Encounter: Payer: Self-pay | Admitting: Oncology

## 2012-06-26 NOTE — Telephone Encounter (Signed)
Confirmed 07/02/12 appt w/ pt.  Mailed letter & calendar to pt.

## 2012-06-27 ENCOUNTER — Telehealth (INDEPENDENT_AMBULATORY_CARE_PROVIDER_SITE_OTHER): Payer: Self-pay

## 2012-06-27 ENCOUNTER — Other Ambulatory Visit: Payer: Self-pay | Admitting: *Deleted

## 2012-06-27 ENCOUNTER — Encounter: Admitting: Physical Therapy

## 2012-06-27 DIAGNOSIS — C50419 Malignant neoplasm of upper-outer quadrant of unspecified female breast: Secondary | ICD-10-CM

## 2012-06-27 DIAGNOSIS — C50919 Malignant neoplasm of unspecified site of unspecified female breast: Secondary | ICD-10-CM

## 2012-06-27 NOTE — Telephone Encounter (Signed)
Pain management Referral faxed to [facility] @ [phone].  Confirmation that fax was rec'd attached to referral.

## 2012-07-01 ENCOUNTER — Encounter

## 2012-07-01 NOTE — Progress Notes (Signed)
Referral MD  Reason for Referral: locally advanced breast cancer   No chief complaint on file. : Patient is a very pleasant 52 year old female seen in the breast multidisciplinary clinic for new diagnosis of left breast cancer. The patient first noticed a lump in the upper outer left breast about mid November. She had been having regular mammograms. She consulted her physician and was referred to the breast center for further workup. Bilateral mammogram was performed which revealed an irregular mass in the upper outer quadrant of the left breast at the 2:00 position measuring approximately 3 cm. A clearly enlarged left axillary lymph node was also seen. 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 Ki-67 was 100%. Subsequent bilateral breast MRI was performed. This reveals abnormal enhancement in the area of the mass measuring 2.9 x 5.2 cm. Again noted was an approximately 3 cm axillary lymph node. No other areas were detected. The patient has not had any previous breast disease or breast biopsies. She is postmenopausal. There is no family history of breast cancer.   HPI: as above  Past Medical History  Diagnosis Date  . Night sweats   . Fatigue   . Wears glasses   . SOB (shortness of breath) on exertion     walking and stairts  . Arthritis   . Hot flashes   . Blood transfusion   . Diverticulitis of colon   . Headache   . Anemia   . Breast cancer     T3N1 invasive ductal carcinoma left breast  . Bursitis   . Carpal tunnel syndrome   :  Past Surgical History  Procedure Date  . Cholecystectomy   . Appendectomy   . Carpal tunnel release     Bilateral  . Tonsillectomy   . Spur     Apex spur on both big toes  . Knee surgery     Left Knee  . Portacath placement 06/18/2011    Procedure: INSERTION PORT-A-CATH;  Surgeon: Mariella Saa, MD;   Location: [facility];  Service: General;  Laterality: Right;  right subclavian  . Abdominal hysterectomy     still has ovaries  . Axillary lymph node dissection 11/28/2011    Procedure: AXILLARY LYMPH NODE DISSECTION;  Surgeon: Mariella Saa, MD;  Location: [facility] OR;  Service: General;  Laterality: Left;  . Breast surgery 2013  . Nasal sinus surgery   :  Current outpatient prescriptions:anastrozole (ARIMIDEX) 1 MG tablet, Take 1 tablet (1 mg total) by mouth daily., Disp: 30 tablet, Rfl: 6;  baclofen (LIORESAL) 10 MG tablet, Take 1 tablet (10 mg total) by mouth 3 (three) times daily. As needed, Disp: 30 each, Rfl: 1;  Bioflavonoid Products (PERIDRIN-C) 200-50-150 MG TABS, Take 200 mg by mouth 3 (three) times daily., Disp: 90 each, Rfl: 0 citalopram (CELEXA) 20 MG tablet, Take 1 tablet (20 mg total) by mouth daily., Disp: 30 tablet, Rfl: 2;  gabapentin (NEURONTIN) 300 MG capsule, Take 300 mg by mouth 3 (three) times daily. , Disp: , Rfl: ;  hyaluronate sodium (RADIAPLEXRX) GEL, Apply topically 2 (two) times daily., Disp: , Rfl: ;  HYDROcodone-acetaminophen (VICODIN) 5-500 MG per tablet, Take 1-2 tablets by mouth every 6 (six) hours as needed. For pain, Disp: , Rfl:  lidocaine-prilocaine (EMLA) cream, Apply 1 application topically daily as needed. Use 1 to 2 hours prior to treatment., Disp: ,  Rfl: ;  LORazepam (ATIVAN) 0.5 MG tablet, TAKE 1 TABLET BY MOUTH EVERY 6 HOURS AS NEEDED FOR ANXIETY, Disp: 30 tablet, Rfl: 0;  predniSONE (DELTASONE) 10 MG tablet, Take 10 mg by mouth 3 (three) times daily., Disp: , Rfl:  pregabalin (LYRICA) 75 MG capsule, Take 1 capsule (75 mg total) by mouth 2 (two) times daily., Disp: 60 capsule, Rfl: 3;  traMADol (ULTRAM) 50 MG tablet, TAKE 1 TABLET BY MOUTH EVERY 6 HOURS AS NEEDED FOR PAIN, Disp: 90 tablet, Rfl: 0:    :  Allergies  Allergen Reactions  . Compazine Other (See Comments)    Numbness of face and lips  . Aleve (Naproxen) Nausea Only  .  Penicillins Nausea Only  :  Family History  Problem Relation Age of Onset  . Cancer Paternal Grandmother     unknown  :  History   Social History  . Marital Status: Single    Spouse Name: N/A    Number of Children: N/A  . Years of Education: N/A   Occupational History  . Not on file.   Social History Main Topics  . Smoking status: Never Smoker   . Smokeless tobacco: Never Used  . Alcohol Use: No  . Drug Use: No  . Sexually Active: No   Other Topics Concern  . Not on file   Social History Narrative  . No narrative on file  :    Pertinent items are noted in HPI.  Exam: Pleasant alert AA woman, BP 147/91, Pulse 71, RR 20, wt 244 lb HEENT:  Sclerae anicteric, conjunctivae pink.  Oropharynx clear.  No mucositis or candidiasis.  Nodes:  No cervical, supraclavicular, or axillary lymphadenopathy palpated.  Breast Exam:  Right breast is benign.  No masses, discharge, skin change, or nipple inversion.  Left breast has a 3 cm mass in the tail of spence, with palpable nodes in the axilla.   Lungs:  Clear to auscultation bilaterally.  No crackles, rhonchi, or wheezes.  Heart:  Regular rate and rhythm.  Abdomen:  Soft, nontender.  Positive bowel sounds.  No organomegaly or masses palpated.  Musculoskeletal:  No focal spinal tenderness to palpation.  Extremities:  Benign.  No peripheral edema or cyanosis.  Skin:  Benign.  Neuro:  Nonfocal.         No results found.  Assessment and Plan:  52 yo woman with locally advanced breast cancer. We discussed the principles of neoadjuvant  therapy  Using combination chemotherapy. She will need staging scans, a 2 d echo, port placement and chemo teaching. I plan to see her in f/u when this has been taken care of.     Pierce Crane MD

## 2012-07-02 ENCOUNTER — Other Ambulatory Visit (HOSPITAL_BASED_OUTPATIENT_CLINIC_OR_DEPARTMENT_OTHER): Admitting: Lab

## 2012-07-02 ENCOUNTER — Other Ambulatory Visit: Payer: Self-pay | Admitting: *Deleted

## 2012-07-02 ENCOUNTER — Telehealth: Payer: Self-pay | Admitting: Oncology

## 2012-07-02 ENCOUNTER — Ambulatory Visit (HOSPITAL_BASED_OUTPATIENT_CLINIC_OR_DEPARTMENT_OTHER): Admitting: Oncology

## 2012-07-02 VITALS — BP 120/80 | HR 73 | Temp 98.2°F | Resp 20 | Ht 63.0 in | Wt 223.7 lb

## 2012-07-02 DIAGNOSIS — C50419 Malignant neoplasm of upper-outer quadrant of unspecified female breast: Secondary | ICD-10-CM

## 2012-07-02 DIAGNOSIS — R52 Pain, unspecified: Secondary | ICD-10-CM

## 2012-07-02 DIAGNOSIS — Z170 Estrogen receptor positive status [ER+]: Secondary | ICD-10-CM

## 2012-07-02 LAB — CBC WITH DIFFERENTIAL/PLATELET
BASO%: 0.4 % (ref 0.0–2.0)
Basophils Absolute: 0 10*3/uL (ref 0.0–0.1)
EOS%: 5.3 % (ref 0.0–7.0)
Eosinophils Absolute: 0.1 10*3/uL (ref 0.0–0.5)
HCT: 33.9 % — ABNORMAL LOW (ref 34.8–46.6)
HGB: 11.6 g/dL (ref 11.6–15.9)
LYMPH%: 48.6 % (ref 14.0–49.7)
MCH: 29.3 pg (ref 25.1–34.0)
MCHC: 34.1 g/dL (ref 31.5–36.0)
MCV: 85.8 fL (ref 79.5–101.0)
MONO#: 0.2 10*3/uL (ref 0.1–0.9)
MONO%: 11.4 % (ref 0.0–14.0)
NEUT#: 0.7 10*3/uL — ABNORMAL LOW (ref 1.5–6.5)
NEUT%: 34.3 % — ABNORMAL LOW (ref 38.4–76.8)
Platelets: 157 10*3/uL (ref 145–400)
RBC: 3.95 10*6/uL (ref 3.70–5.45)
RDW: 14.1 % (ref 11.2–14.5)
WBC: 2.2 10*3/uL — ABNORMAL LOW (ref 3.9–10.3)
lymph#: 1.1 10*3/uL (ref 0.9–3.3)

## 2012-07-02 LAB — COMPREHENSIVE METABOLIC PANEL (CC13)
ALT: 13 U/L (ref 0–55)
AST: 14 U/L (ref 5–34)
Albumin: 3.3 g/dL — ABNORMAL LOW (ref 3.5–5.0)
Alkaline Phosphatase: 64 U/L (ref 40–150)
BUN: 14.8 mg/dL (ref 7.0–26.0)
CO2: 29 mEq/L (ref 22–29)
Calcium: 9.6 mg/dL (ref 8.4–10.4)
Chloride: 104 mEq/L (ref 98–107)
Creatinine: 0.7 mg/dL (ref 0.6–1.1)
Glucose: 95 mg/dl (ref 70–99)
Potassium: 4 mEq/L (ref 3.5–5.1)
Sodium: 140 mEq/L (ref 136–145)
Total Bilirubin: 0.77 mg/dL (ref 0.20–1.20)
Total Protein: 6.5 g/dL (ref 6.4–8.3)

## 2012-07-02 NOTE — Progress Notes (Signed)
ID: Kelsey Wilson   DOB: [birth year]  MR#: [id number]  WUJ#:[id number]  PCP: Kelsey Livings, MD GYN:  SU: Kelsey Wilson OTHER MD: Kelsey Wilson, Kelsey Wilson   HISTORY OF PRESENT ILLNESS: From Dr. Renelda Wilson note 06/13/2011:  The patient first noticed a lump in the upper outer left breast about mid November. She had been having regular mammograms. She consulted her physician and was referred to the breast center for further workup. Bilateral mammogram was performed which revealed an irregular mass in the upper outer quadrant of the left breast at the 2:00 position measuring approximately 3 cm. A clearly enlarged left axillary lymph node was also seen. 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 Ki-67 was 100%. Subsequent bilateral breast MRI was performed. This reveals abnormal enhancement in the area of the mass measuring 2.9 x 5.2 cm. Again noted was an approximately 3 cm axillary lymph node. No other areas were detected. Her subsequent history is as detailed below  INTERVAL HISTORY: She'll returns for followup of her breast cancer. She is establishing herself on my practice today  REVIEW OF SYSTEMS: The review of systems is dominated by multiple areas of pain. She hurts were the port was removed. Her hands hurt all the time and it is very difficult for her to drive. Her feet suffer from severe neuropathy and it is hard for her to even wear shoes. She has cramps in her feet and legs and the muscle spasms in her abdomen are getting worse. Her ankles are swollen. She sleeps poorly, and is severely fatigued. She has some hoarseness, chest wall pains, shortness of breath even at rest, urinary dribbling, easy bruising, back and joint pain in addition to the pains already noted, headaches which however are not more frequent or intense than prior, she is anxious,  has hot flashes, and feels weak and forgetful.  PAST MEDICAL HISTORY: Past Medical History  Diagnosis Date  . Night sweats   . Fatigue   . Wears glasses   . SOB (shortness of breath) on exertion     walking and stairts  . Arthritis   . Hot flashes   . Blood transfusion   . Diverticulitis of colon   . Headache   . Anemia   . Breast cancer     T3N1 invasive ductal carcinoma left breast  . Bursitis   . Carpal tunnel syndrome     PAST SURGICAL HISTORY: Past Surgical History  Procedure Date  . Cholecystectomy   . Appendectomy   . Carpal tunnel release     Bilateral  . Tonsillectomy   . Spur     Apex spur on both big toes  . Knee surgery     Left Knee  . Portacath placement 06/18/2011    Procedure: INSERTION PORT-A-CATH;  Surgeon: Kelsey Saa, MD;  Location: [facility];  Service: General;  Laterality: Right;  right subclavian  . Abdominal hysterectomy     still has ovaries  . Axillary lymph node dissection 11/28/2011    Procedure: AXILLARY LYMPH NODE DISSECTION;  Surgeon: Kelsey Saa, MD;  Location: [facility];  Service: General;  Laterality: Left;  . Breast surgery 2013  . Nasal sinus surgery     FAMILY HISTORY Family History  Problem Relation Age of Onset  . Cancer Paternal Grandmother     unknown   her father, who is a  minister at [employer] she attends, is 27. Her mother is 70. The patient has 3 brothers and 3 sisters. There is no history of breast or ovarian cancer in the immediate family.  GYNECOLOGIC HISTORY: Menarche age 43, first live birth age 69, she is GX P3. She underwent hysterectomy without salpingo-oophorectomy in 2000. She never took hormone replacement.   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 She is currently applying for disability. She is divorced. At home she lives with her daughter Kelsey Wilson and her 3 children, as well as 2 children from her son Kelsey Wilson, who works as a Naval architect. All  together there are 5 children in the house between the ages of 30 and 1. The patient has for additional grandchildren. She attends a [employer].   ADVANCED DIRECTIVES: Not in place  HEALTH MAINTENANCE: History  Substance Use Topics  . Smoking status: Never Smoker   . Smokeless tobacco: Never Used  . Alcohol Use: No     Colonoscopy:  PAP:  Bone density:  Lipid panel:  Allergies  Allergen Reactions  . Compazine Other (See Comments)    Numbness of face and lips  . Aleve (Naproxen) Nausea Only  . Penicillins Nausea Only    Current Outpatient Prescriptions  Medication Sig Dispense Refill  . ALPRAZolam (XANAX) 0.5 MG tablet Take 0.5 mg by mouth 2 (two) times daily as needed.      Marland Kitchen anastrozole (ARIMIDEX) 1 MG tablet Take 1 tablet (1 mg total) by mouth daily.  30 tablet  6  . baclofen (LIORESAL) 10 MG tablet Take 1 tablet (10 mg total) by mouth 3 (three) times daily. As needed  30 each  1  . Bioflavonoid Products (PERIDRIN-C) 200-50-150 MG TABS Take 200 mg by mouth 3 (three) times daily.  90 each  0  . hyaluronate sodium (RADIAPLEXRX) GEL Apply topically 2 (two) times daily.      Marland Kitchen HYDROcodone-acetaminophen (VICODIN) 5-500 MG per tablet Take 1-2 tablets by mouth every 6 (six) hours as needed. For pain      . lidocaine-prilocaine (EMLA) cream Apply 1 application topically daily as needed. Use 1 to 2 hours prior to treatment.      Marland Kitchen LORazepam (ATIVAN) 0.5 MG tablet TAKE 1 TABLET BY MOUTH EVERY 6 HOURS AS NEEDED FOR ANXIETY  30 tablet  0  . predniSONE (DELTASONE) 10 MG tablet Take 10 mg by mouth 3 (three) times daily.      . pregabalin (LYRICA) 75 MG capsule Take 1 capsule (75 mg total) by mouth 2 (two) times daily.  60 capsule  3  . traMADol (ULTRAM) 50 MG tablet TAKE 1 TABLET BY MOUTH EVERY 6 HOURS AS NEEDED FOR PAIN  90 tablet  0  . citalopram (CELEXA) 20 MG tablet Take 1 tablet (20 mg total) by mouth daily.  30 tablet  2  . gabapentin (NEURONTIN) 300 MG capsule Take 300 mg by  mouth 3 (three) times daily.         OBJECTIVE: Middle-aged African American woman Filed Vitals:   07/02/12 0958  BP: 120/80  Pulse: 73  Temp: 98.2 F (36.8 C)  Resp: 20     Body mass index is 39.63 kg/(m^2).    ECOG FS: 2  Sclerae unicteric Oropharynx clear No cervical or supraclavicular adenopathy Lungs no rales or rhonchi, fair excursion bilaterally Heart regular rate and rhythm, no murmur appreciated Abd obese, benign MSK tenderness to mild palpation diffusely over the spine, tenderness over  the hands and wrists Neuro: nonfocal, well oriented Breasts: The right breast is unremarkable. The left breast is status post lumpectomy and radiation. It is tender to palpation. I do not palpate any obvious masses, there are no suspicious skin or nipple changes. The left axilla is benign.   LAB RESULTS: Lab Results  Component Value Date   WBC 2.2* 07/02/2012   NEUTROABS 0.7* 07/02/2012   HGB 11.6 07/02/2012   HCT 33.9* 07/02/2012   MCV 85.8 07/02/2012   PLT 157 07/02/2012      Chemistry      Component Value Date/Time   NA 140 07/02/2012 0931   NA 135 02/11/2012 1450   K 4.0 07/02/2012 0931   K 3.9 02/11/2012 1450   CL 104 07/02/2012 0931   CL 101 02/11/2012 1450   CO2 29 07/02/2012 0931   CO2 26 02/11/2012 1450   BUN 14.8 07/02/2012 0931   BUN 13 02/11/2012 1450   CREATININE 0.7 07/02/2012 0931   CREATININE 0.53 02/11/2012 1450      Component Value Date/Time   CALCIUM 9.6 07/02/2012 0931   CALCIUM 9.0 02/11/2012 1450   ALKPHOS 64 07/02/2012 0931   ALKPHOS 60 11/08/2011 1138   AST 14 07/02/2012 0931   AST 17 11/08/2011 1138   ALT 13 07/02/2012 0931   ALT 9 11/08/2011 1138   BILITOT 0.77 07/02/2012 0931   BILITOT 1.1 11/08/2011 1138       Lab Results  Component Value Date   LABCA2 41* 06/13/2011    No components found with this basename: LABCA125    No results found for this basename: INR:1;PROTIME:1 in the last 168 hours  Urinalysis    Component Value Date/Time   COLORURINE YELLOW  02/11/2012 1343   APPEARANCEUR CLEAR 02/11/2012 1343   LABSPEC 1.010 02/11/2012 1343   PHURINE 6.5 02/11/2012 1343   GLUCOSEU NEGATIVE 02/11/2012 1343   HGBUR NEGATIVE 02/11/2012 1343   BILIRUBINUR NEGATIVE 02/11/2012 1343   KETONESUR NEGATIVE 02/11/2012 1343   PROTEINUR NEGATIVE 02/11/2012 1343   UROBILINOGEN 0.2 02/11/2012 1343   NITRITE NEGATIVE 02/11/2012 1343   LEUKOCYTESUR NEGATIVE 02/11/2012 1343    STUDIES: No results found.  ASSESSMENT: 52 y.o. [place] woman  (1) status post left breast biopsy 06/01/2011 for a cT2 pN1 invasive ductal carcinoma, grade 3, estrogen receptor 80% and progesterone receptor 9% positive, with no HER-2 amplification and an MIB-1 of 100%  (2) treated neoadjuvant [place] with 4 cycles of cyclophosphamide, epirubicin and fluorouracil, followed by 4 cycles of docetaxel, completed 11/01/2011  (3) status post left lumpectomy and axillary lymph node resection 11/28/2011, showing a complete pathologic response (no residual invasive or in situ cancer in the breast and 0 of 17 lymph nodes involved)  (4) adjuvant radiation therapy completed 03/31/2012  (5) started anastrozole November 2013; normal bone density scan 04/09/2012  PLAN: We spent the better part of her hour-long visit today discussing the nature of breast cancer, and the details of her tumor and its treatment. She understands that she is currently in remission, and we do not have any evidence of active cancer. She is tolerating the anastrozole moderately well, although it might be difficult to tell since she has chronic problems with pain at multiple sites, carpal tunnel, and muscle spasms.  Her prognosis is good, since she had a complete pathologic response. The plan is to continue anastrozole for a total of 5 years. A pain clinic referral had been placed earlier, but apparently sent to the wrong fax number, so  that was entered again today. She is going to return to see our physician's assistant in April, and then if she  sees Dr. Johna Sheriff in July, she will see me in October of this year. We will continue to follow her on an alternating every 3 month basis until she reaches 2 years from her definitive surgery.   MAGRINAT,GUSTAV C    07/02/2012

## 2012-07-02 NOTE — Telephone Encounter (Signed)
gv pt appt schedule for April.  °

## 2012-07-04 ENCOUNTER — Encounter

## 2012-07-07 ENCOUNTER — Emergency Department (HOSPITAL_COMMUNITY)

## 2012-07-07 ENCOUNTER — Emergency Department (HOSPITAL_COMMUNITY)
Admission: EM | Admit: 2012-07-07 | Discharge: 2012-07-07 | Disposition: A | Discharge status: Home / Self Care | Attending: Emergency Medicine | Admitting: Emergency Medicine

## 2012-07-07 ENCOUNTER — Encounter: Payer: Self-pay | Admitting: *Deleted

## 2012-07-07 ENCOUNTER — Encounter (HOSPITAL_COMMUNITY): Payer: Self-pay | Admitting: Emergency Medicine

## 2012-07-07 ENCOUNTER — Other Ambulatory Visit: Payer: Self-pay | Admitting: Oncology

## 2012-07-07 ENCOUNTER — Other Ambulatory Visit: Payer: Self-pay | Admitting: Orthopedic Surgery

## 2012-07-07 ENCOUNTER — Other Ambulatory Visit: Payer: Self-pay | Admitting: Emergency Medicine

## 2012-07-07 ENCOUNTER — Encounter: Payer: Self-pay | Admitting: Oncology

## 2012-07-07 DIAGNOSIS — R5383 Other fatigue: Secondary | ICD-10-CM | POA: Insufficient documentation

## 2012-07-07 DIAGNOSIS — M25569 Pain in unspecified knee: Secondary | ICD-10-CM | POA: Insufficient documentation

## 2012-07-07 DIAGNOSIS — Z8719 Personal history of other diseases of the digestive system: Secondary | ICD-10-CM | POA: Insufficient documentation

## 2012-07-07 DIAGNOSIS — R5381 Other malaise: Secondary | ICD-10-CM | POA: Insufficient documentation

## 2012-07-07 DIAGNOSIS — Z8739 Personal history of other diseases of the musculoskeletal system and connective tissue: Secondary | ICD-10-CM | POA: Insufficient documentation

## 2012-07-07 DIAGNOSIS — M25559 Pain in unspecified hip: Secondary | ICD-10-CM | POA: Insufficient documentation

## 2012-07-07 DIAGNOSIS — R61 Generalized hyperhidrosis: Secondary | ICD-10-CM | POA: Insufficient documentation

## 2012-07-07 DIAGNOSIS — Z853 Personal history of malignant neoplasm of breast: Secondary | ICD-10-CM | POA: Insufficient documentation

## 2012-07-07 DIAGNOSIS — Z79899 Other long term (current) drug therapy: Secondary | ICD-10-CM | POA: Insufficient documentation

## 2012-07-07 DIAGNOSIS — Z862 Personal history of diseases of the blood and blood-forming organs and certain disorders involving the immune mechanism: Secondary | ICD-10-CM | POA: Insufficient documentation

## 2012-07-07 IMAGING — CR DG TIBIA/FIBULA 2V*R*
5 series · 5 of 5 positions shown · non-contrast
Comparison: None.

CLINICAL DATA: Lower leg pain.

RIGHT TIBIA AND FIBULA - 2 VIEW

[x tib-fib lat right (1 of 4)]
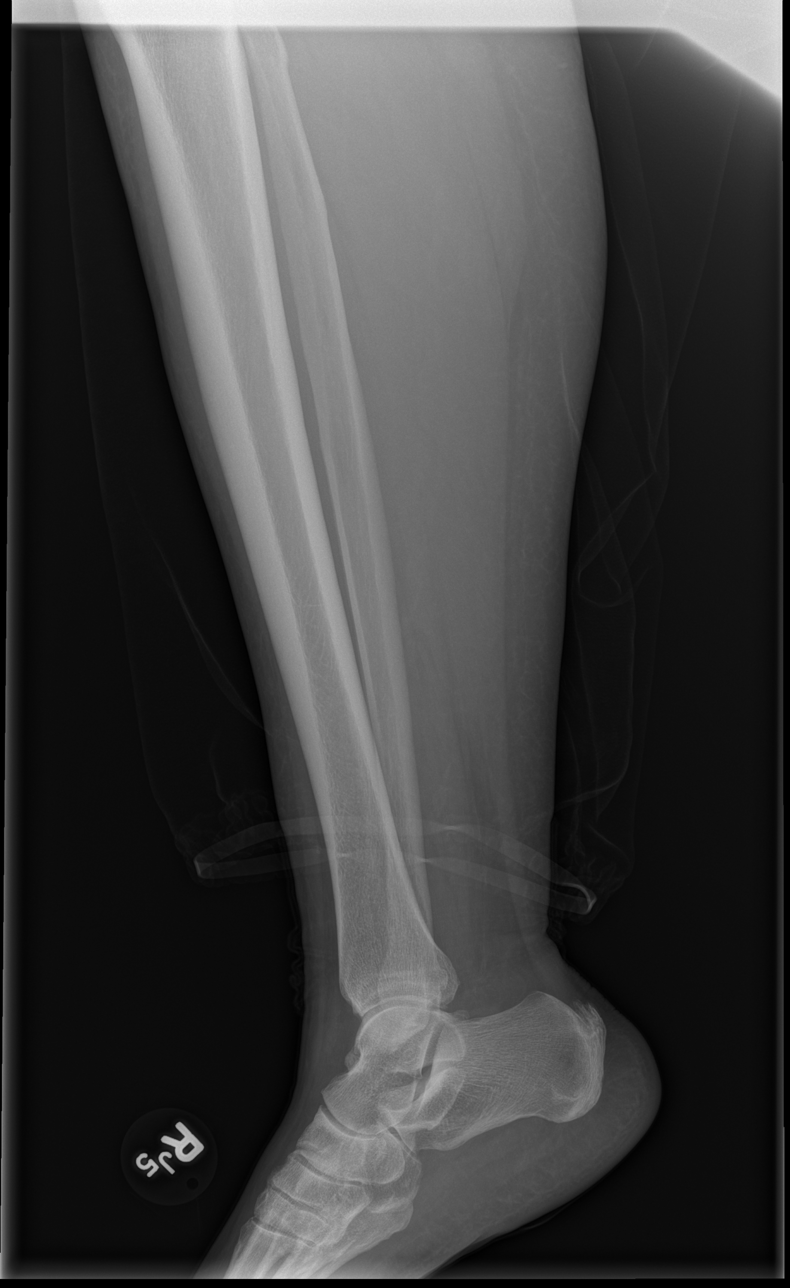

[x tib-fib lat right (2 of 4)]
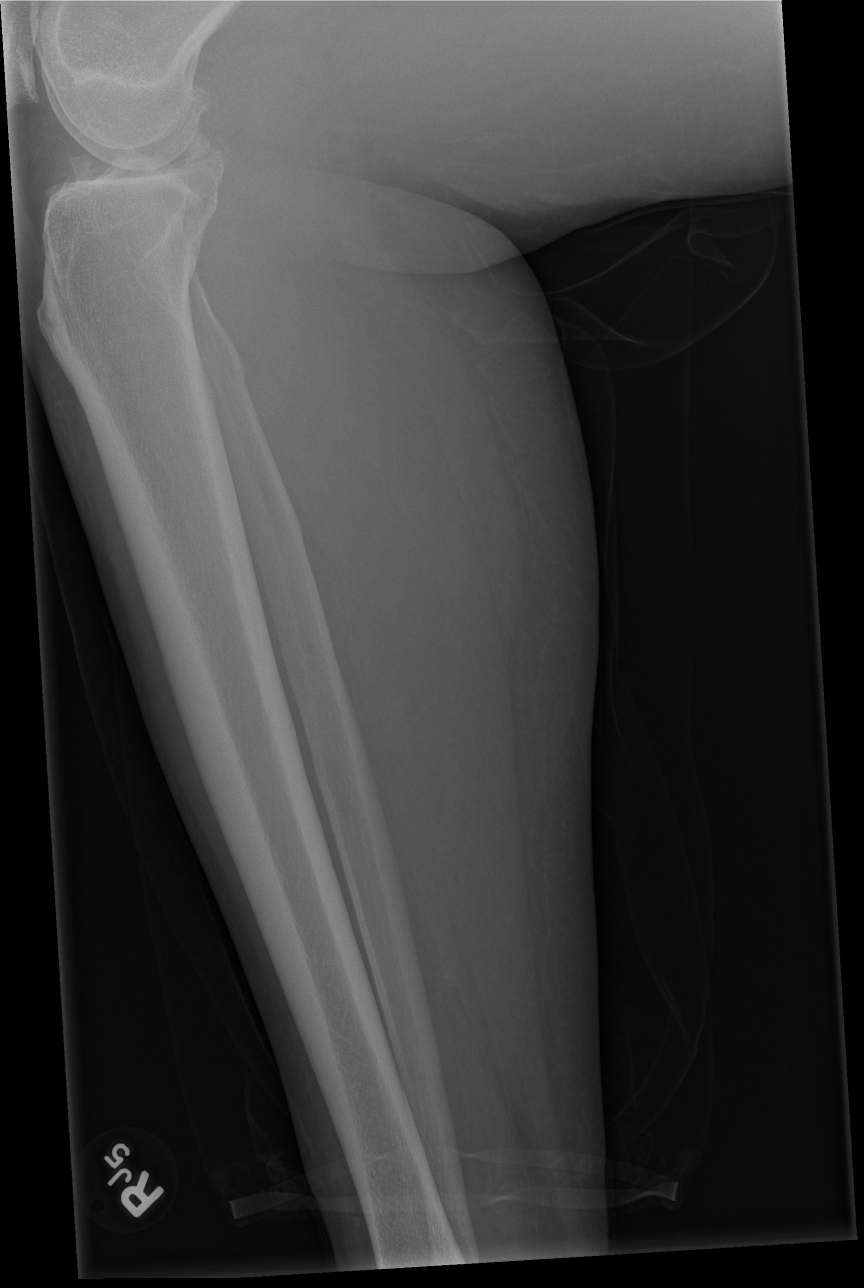

[x tib-fib lat right (3 of 4)]
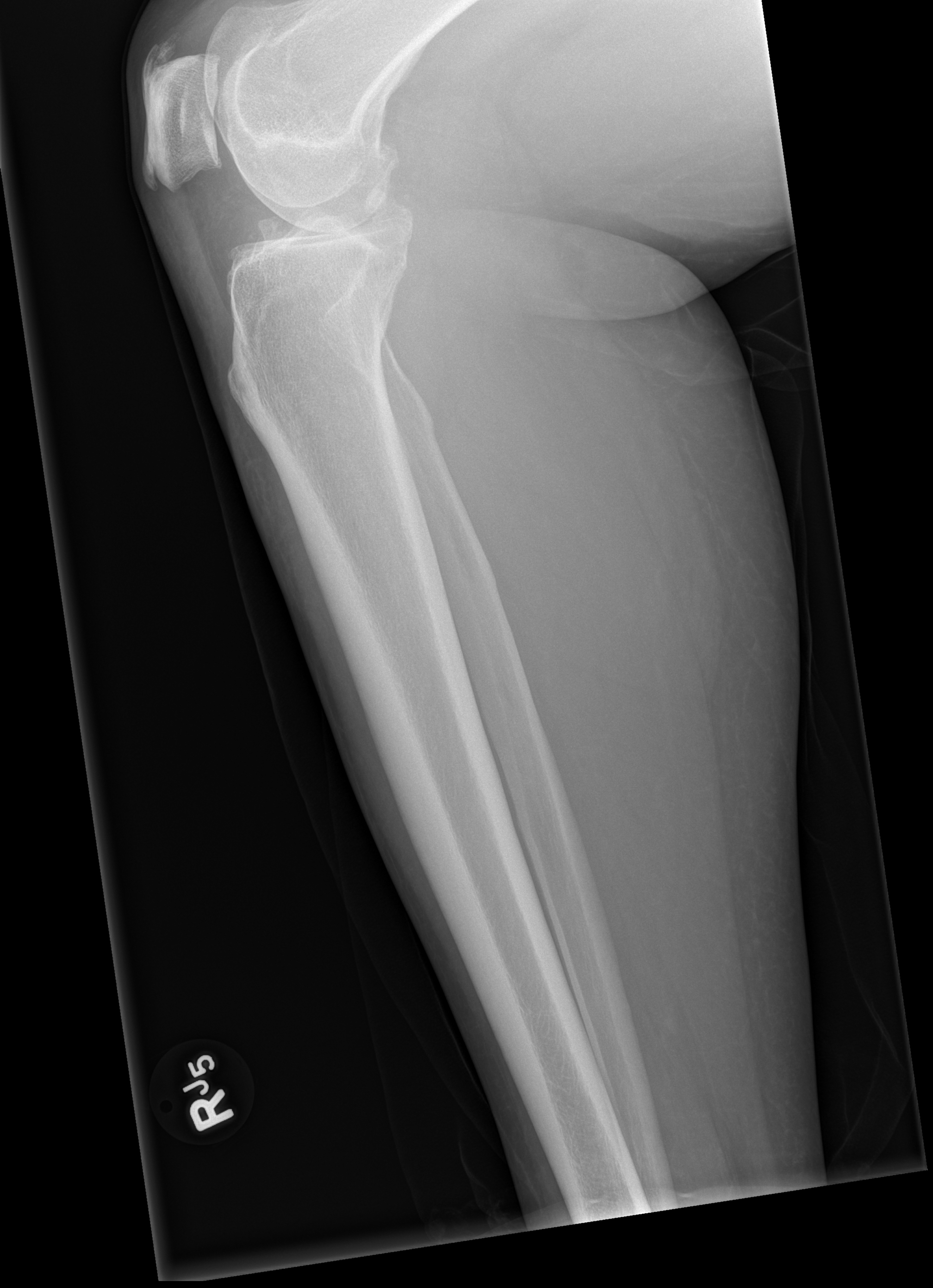

[x tib-fib lat right (4 of 4)]
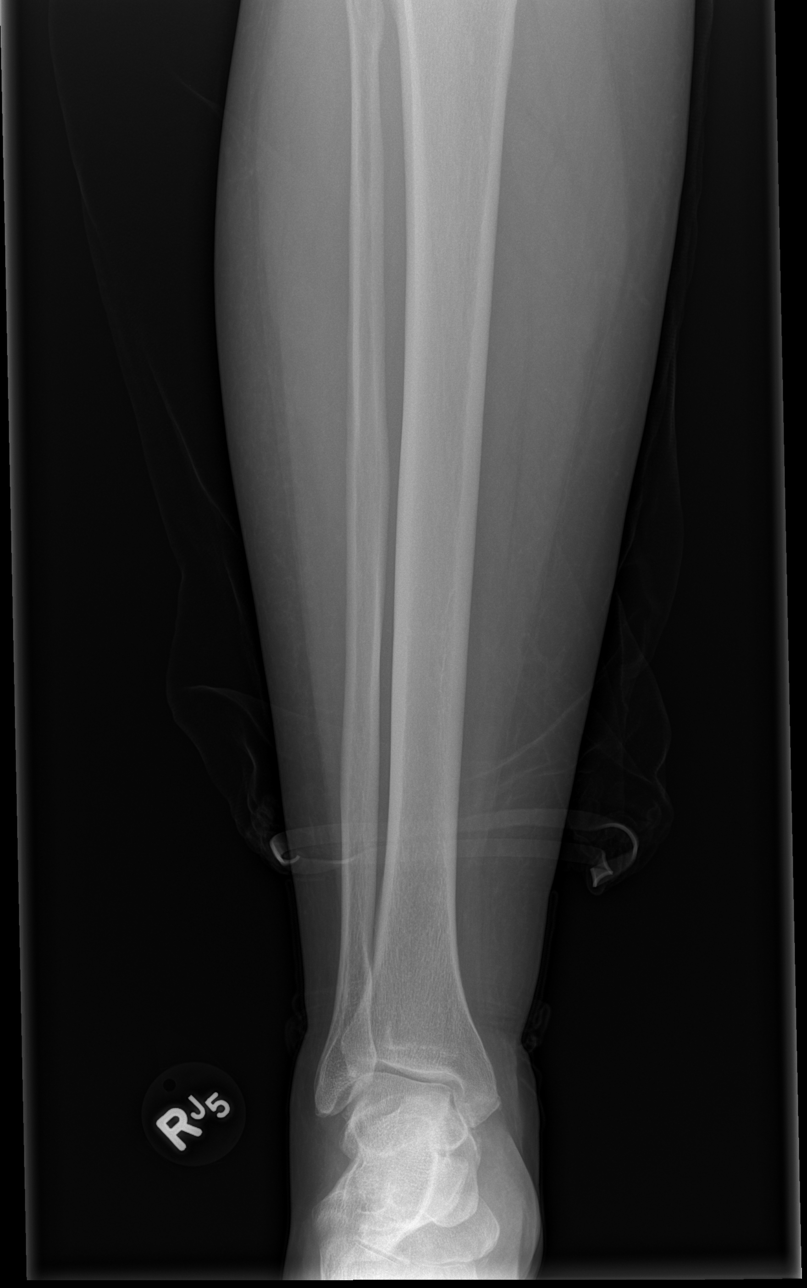

[x tib-fib ap right]
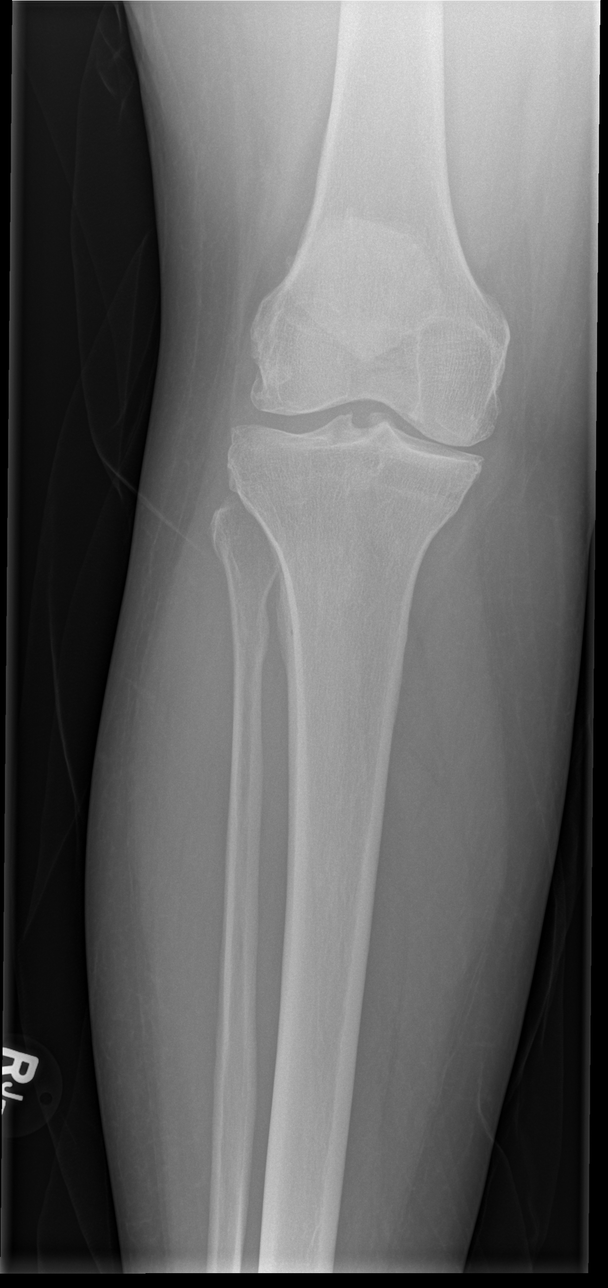

[5 of 5 positions shown; findings below may reference images not displayed]

FINDINGS: Tibia and fibula are intact.  Degenerative changes are
incidentally imaged in the knee.
IMPRESSION: No acute osseous abnormality.

## 2012-07-07 IMAGING — CR DG KNEE COMPLETE 4+V*R*
4 series · 4 of 4 positions shown · non-contrast
Comparison: None.

CLINICAL DATA: Right knee pain.

RIGHT KNEE - COMPLETE 4+ VIEW

[t knee ap right]
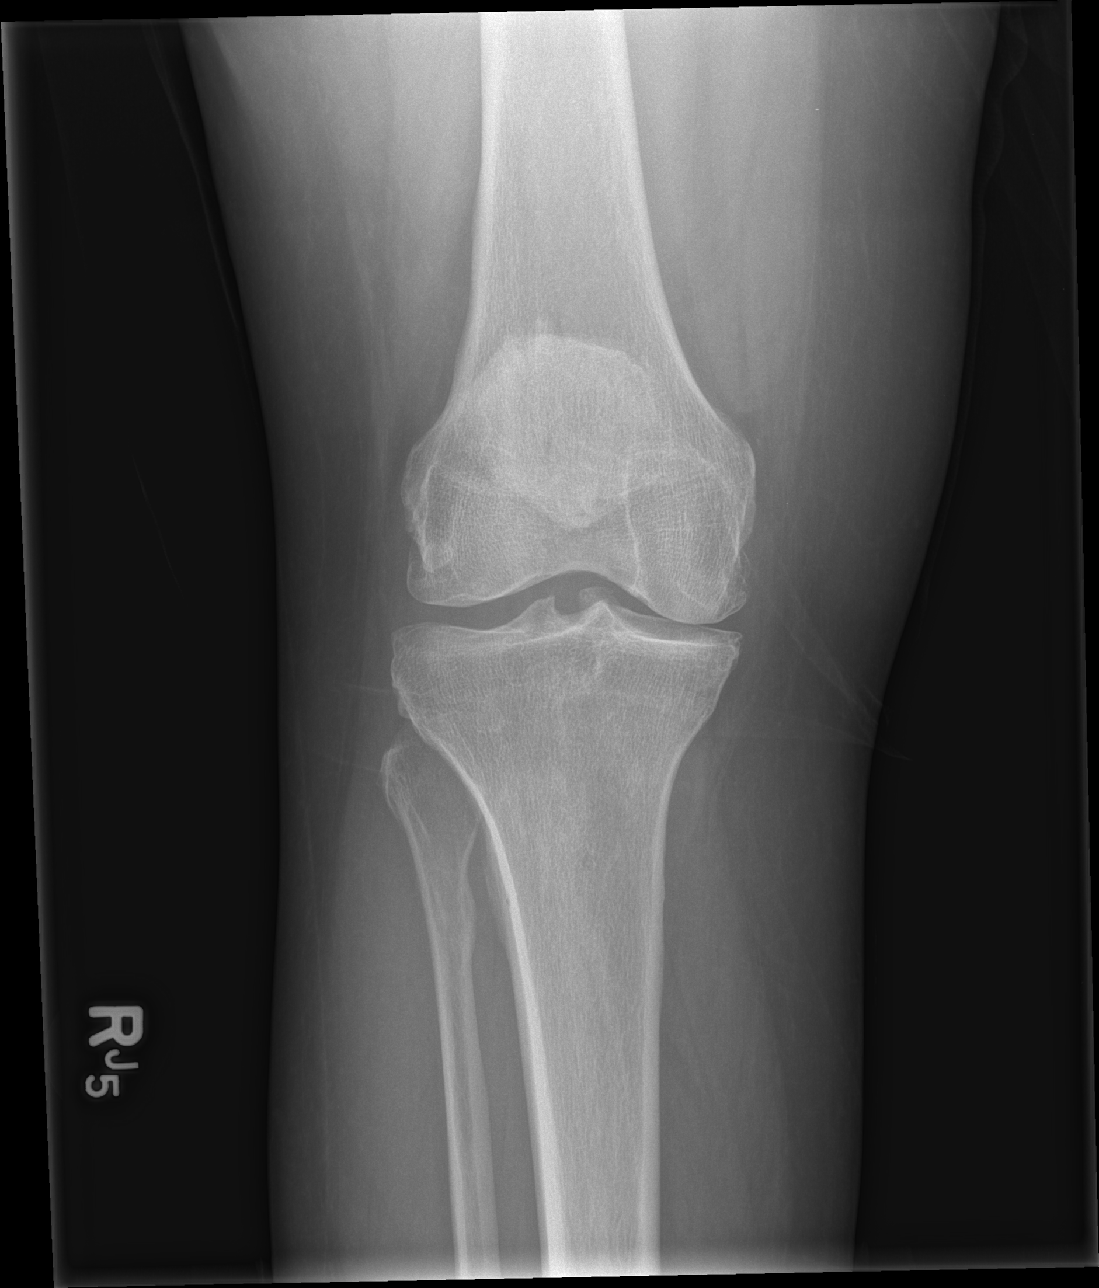

[t knee obl right]
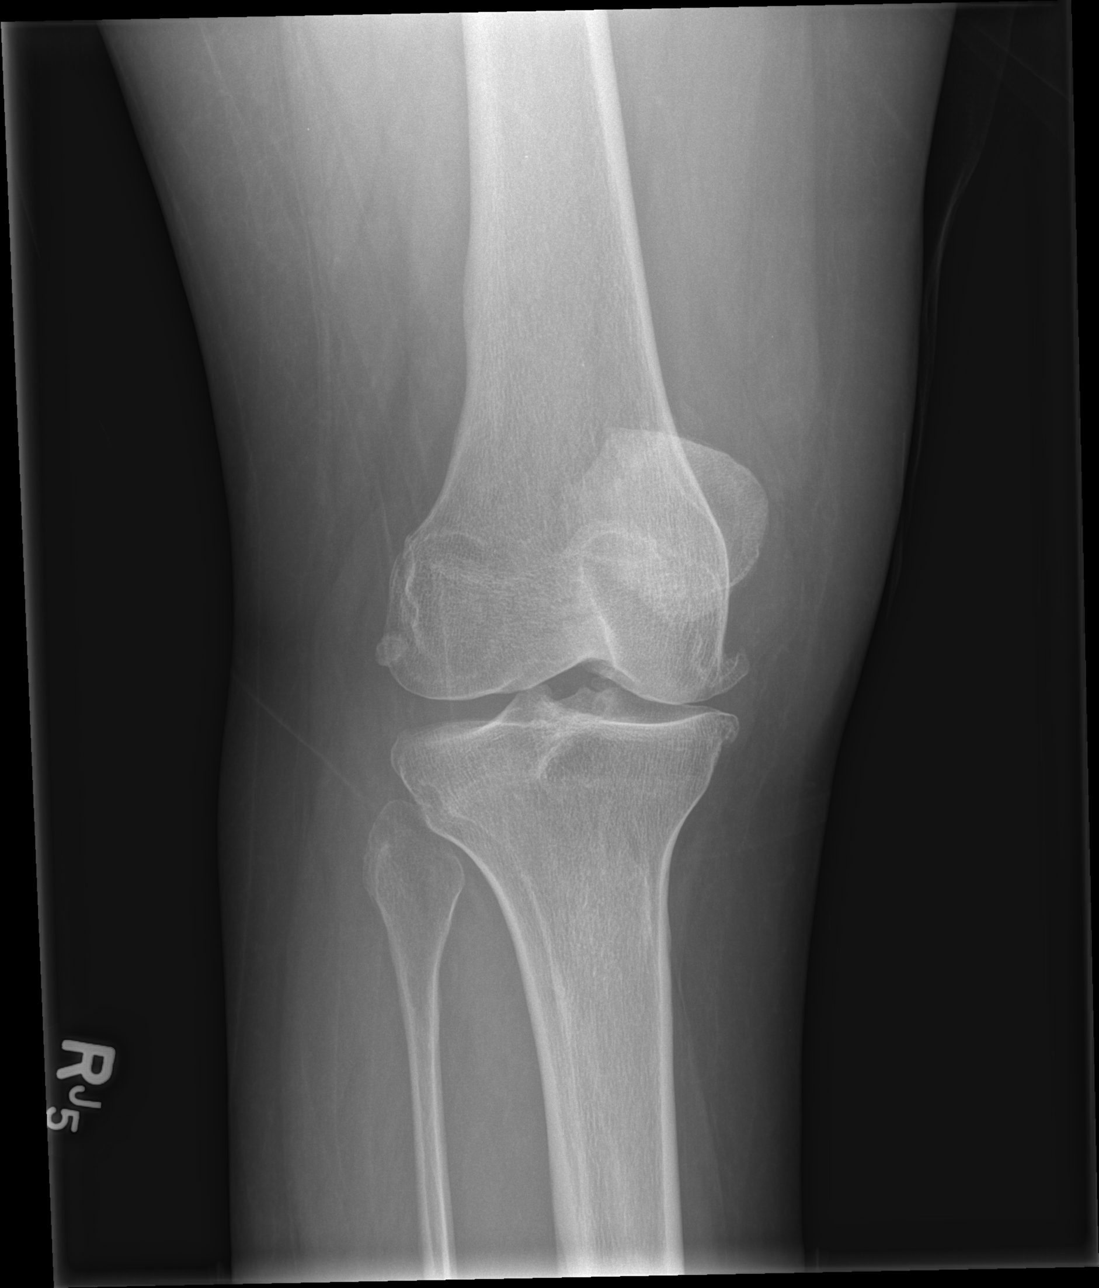

[t knee lat right (1 of 2)]
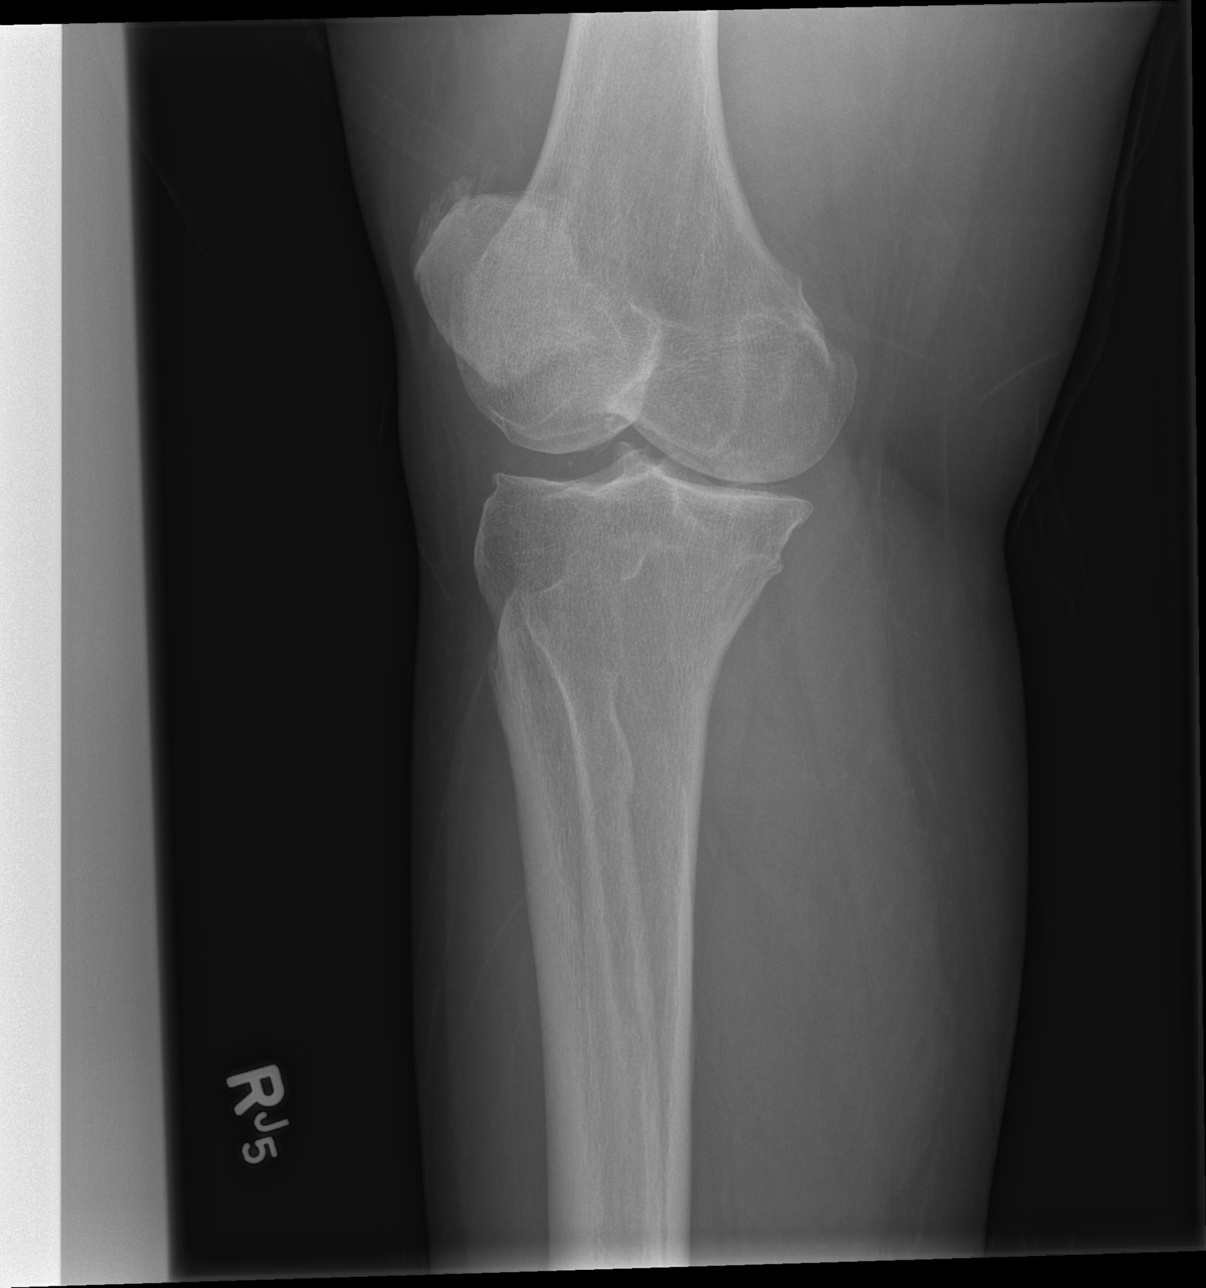

[t knee lat right (2 of 2)]
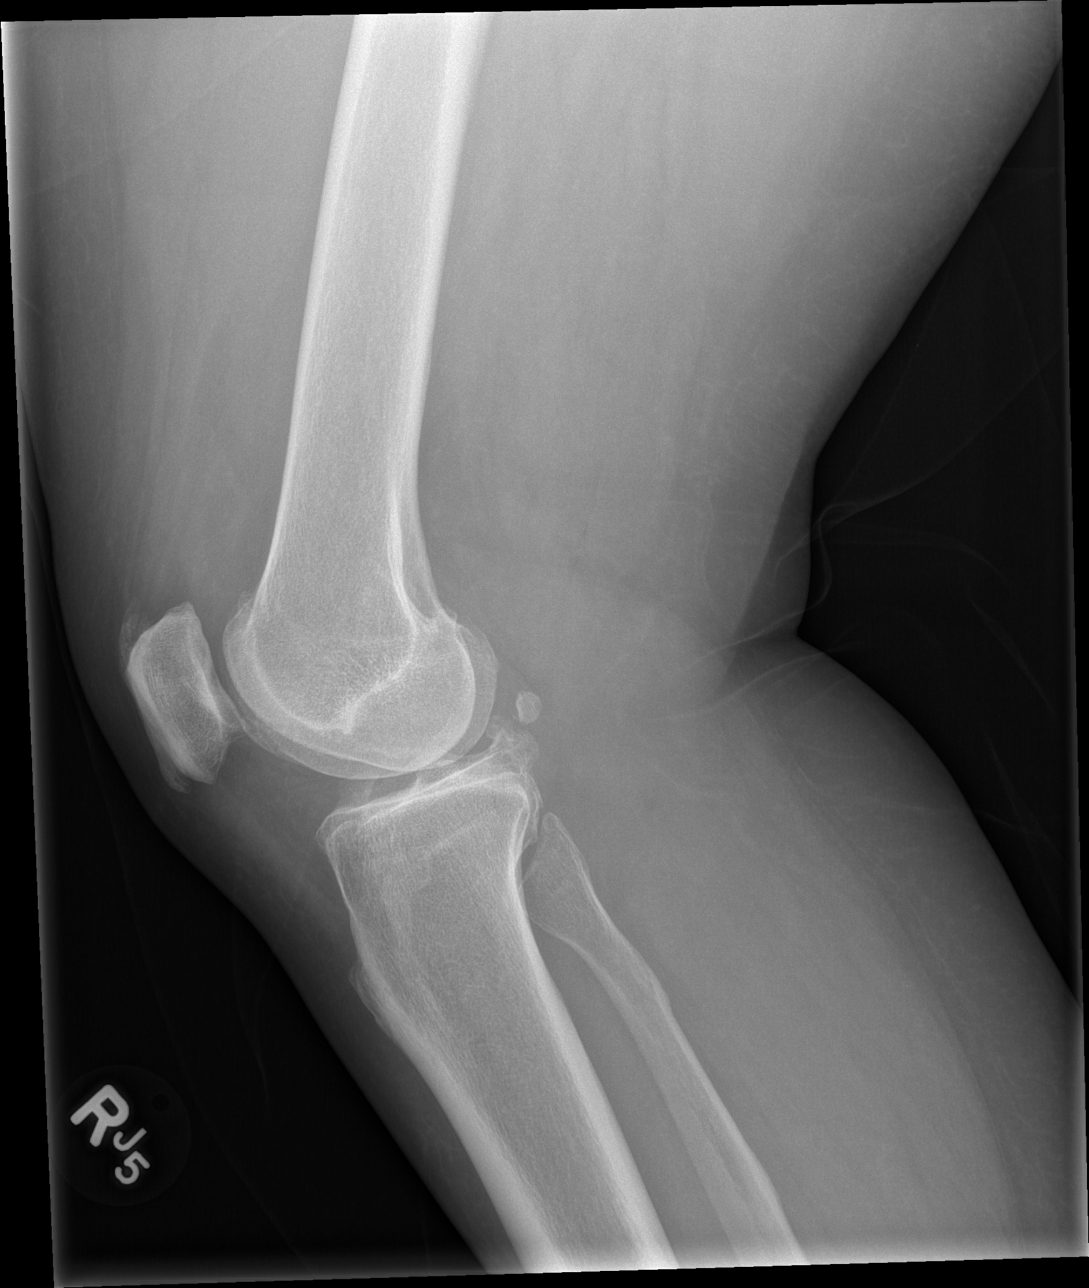

[4 of 4 positions shown; findings below may reference images not displayed]

FINDINGS: No fracture or dislocation is noted.  Moderate narrowing
of medial joint space is noted with osteophyte formation seen
medially and laterally.  No definite joint effusion is noted.
IMPRESSION: Mild to moderate degenerative joint disease.  No acute abnormality
seen in the right knee.

## 2012-07-07 IMAGING — CR DG HIP COMPLETE 2+V*R*
3 series · 3 of 3 positions shown · non-contrast
Comparison: 08/16/2011.

CLINICAL DATA: Right hip and right lower leg pain.  History of
breast cancer.

RIGHT HIP - COMPLETE 2+ VIEW

[t pelvis ap]
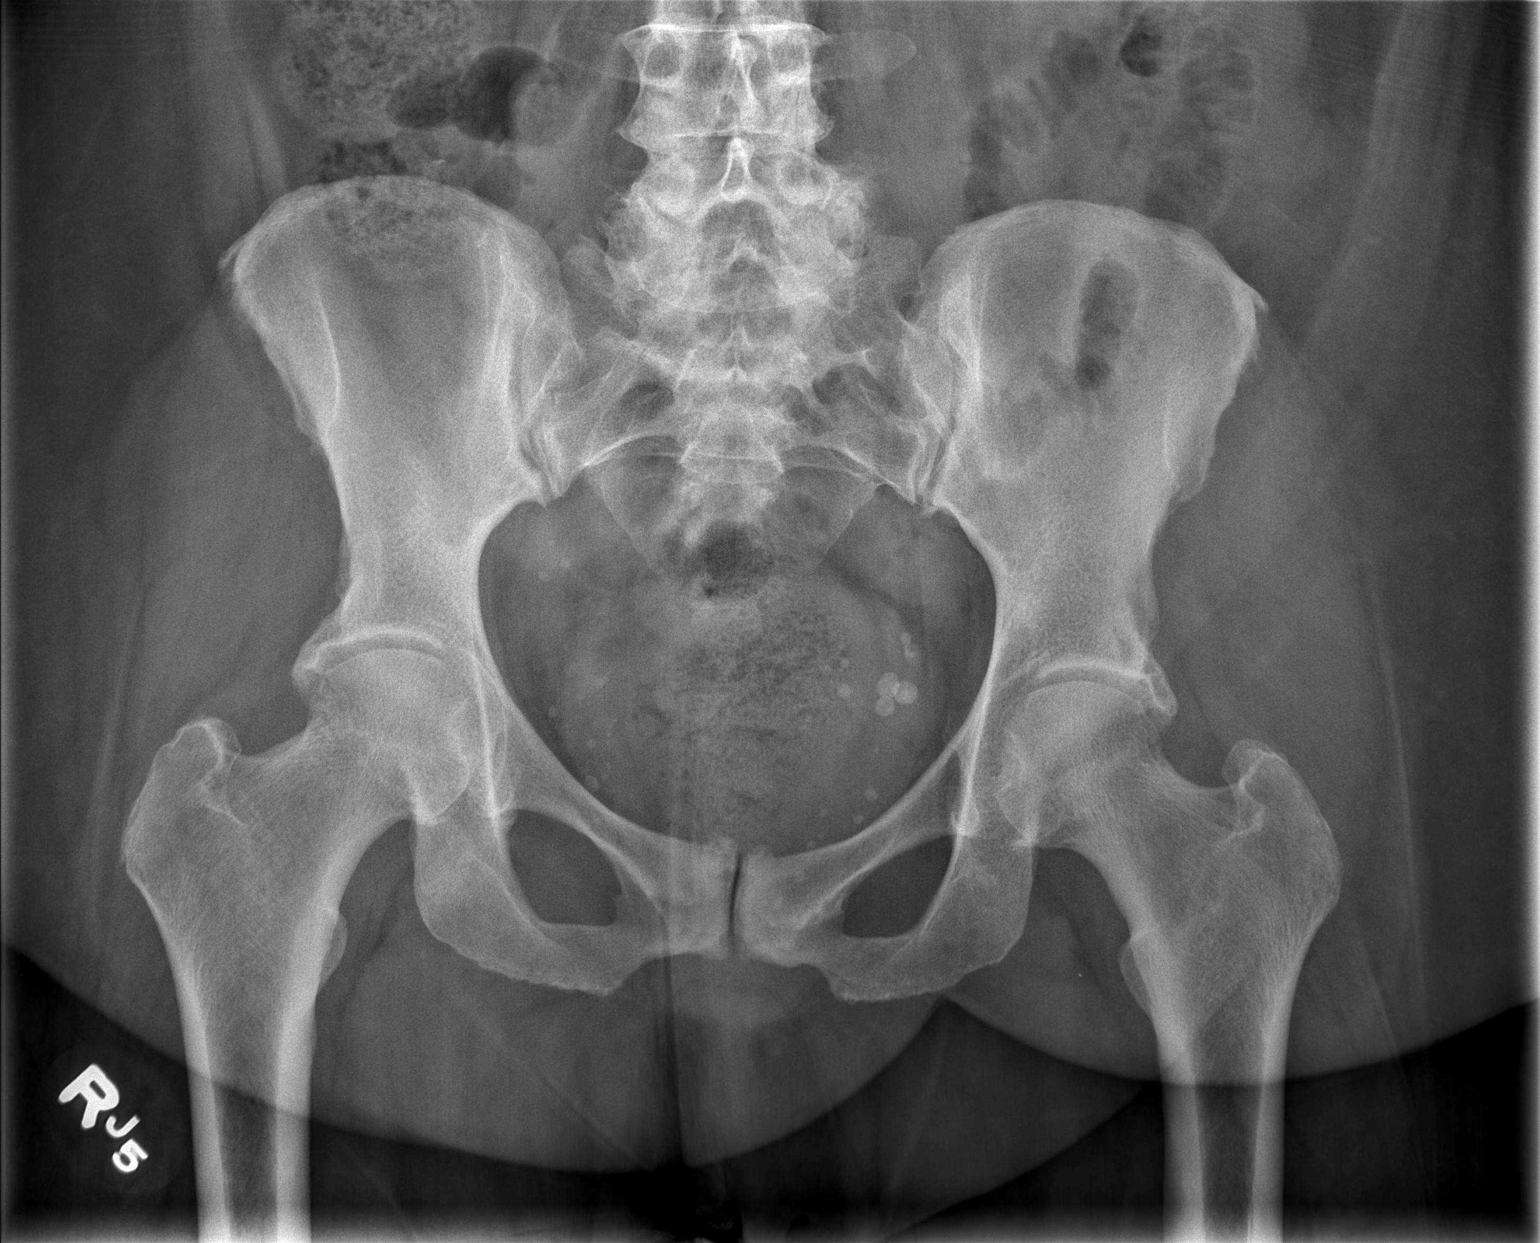

[t hip ap right]
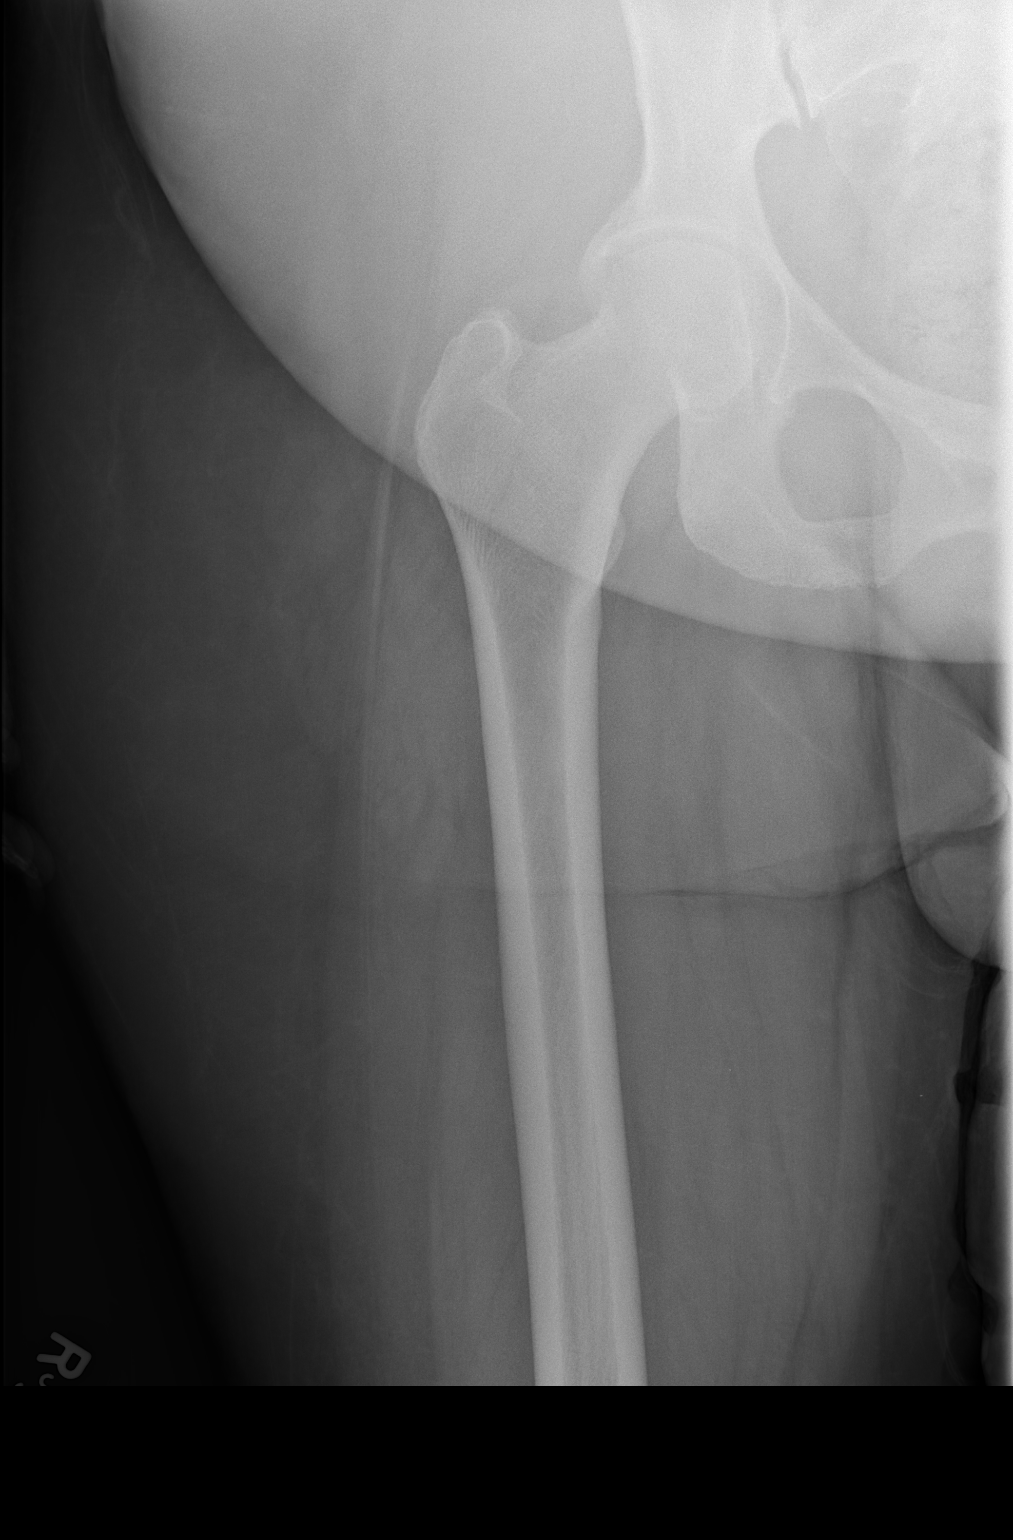

[t hip frog leg right]
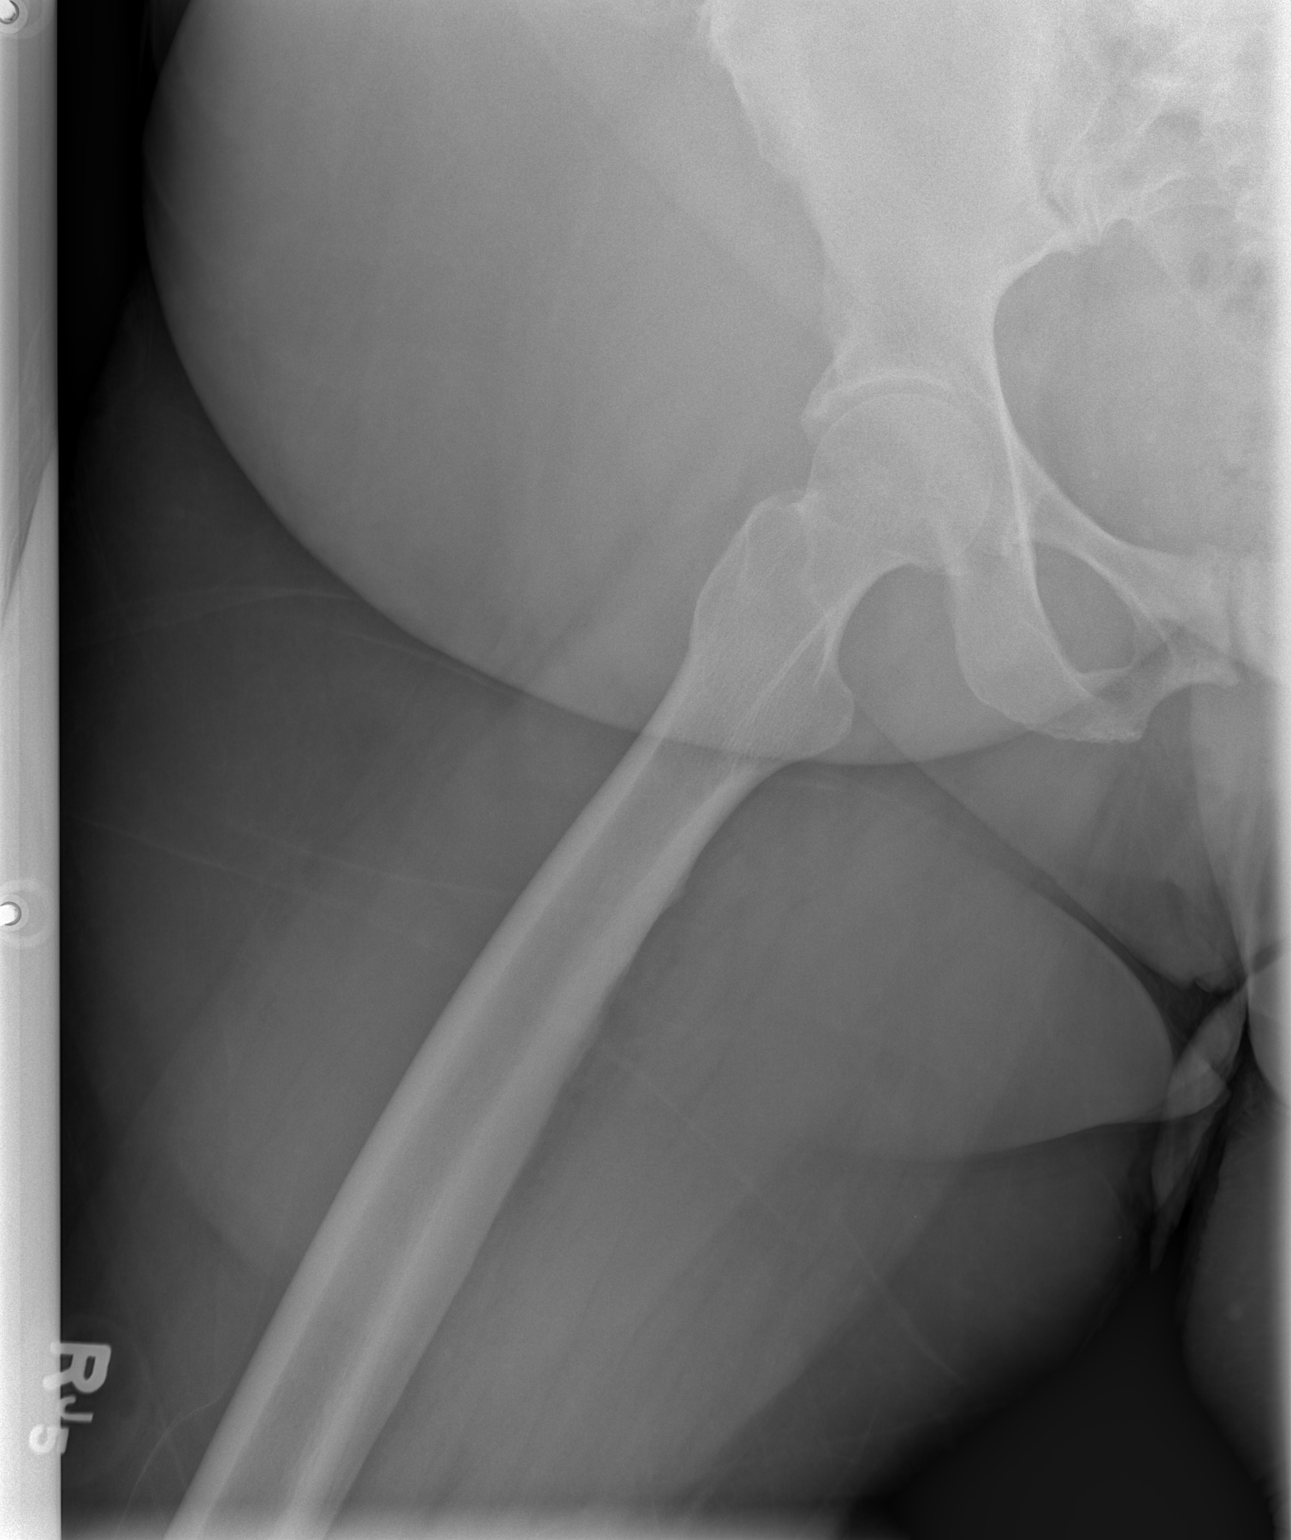

[3 of 3 positions shown; findings below may reference images not displayed]

FINDINGS: Hip joint space is maintained bilaterally.  No
significant subchondral sclerosis or cyst formation, nor
osteophytosis.  No worrisome lytic or sclerotic lesions.
Degenerative changes are seen at the symphysis pubis.
IMPRESSION: 1.  Hips are symmetric.  No acute findings.
2.  Degenerative changes at the symphysis pubis.

## 2012-07-07 MED ORDER — OXYCODONE-ACETAMINOPHEN 5-325 MG PO TABS: 1.0000 | ORAL_TABLET | ORAL | Status: DC | PRN

## 2012-07-07 NOTE — ED Provider Notes (Signed)
Medical screening examination/treatment/procedure(s) were performed by non-physician practitioner and as supervising physician I was immediately available for consultation/collaboration.    Tinsley Lomas L Alexios Keown, MD 07/07/12 2235 

## 2012-07-07 NOTE — Progress Notes (Signed)
Faxed pa form for lyrica 75mg  to [facility].

## 2012-07-07 NOTE — Progress Notes (Signed)
RECEIVED A FAX FROM [facility] CONCERNING A PRIOR AUTHORIZATION FOR LYRICA. THIS REQUEST WAS PLACED IN THE MANAGED CARE BIN.

## 2012-07-07 NOTE — ED Provider Notes (Signed)
History     CSN: [id number]  Arrival date & time 07/07/12  0850   First MD Initiated Contact with Patient 07/07/12 [phone]      Chief Complaint  Patient presents with  . Leg Pain    (Consider location/radiation/quality/duration/timing/severity/associated sxs/prior treatment) HPI 52 year old female presents emergency department with chief complaint of right knee by hip and back pain.  Patient has a history of chronic pain and neuropathy.  She has both arthritic changes. Patient has chronic right knee hip and back pain, however it has 2 days the pain is gone next increasingly worse.  She has been unable to perform her ADLs.  She has difficulty sitting or walking.  She has Ultram at home which he has been taking and has been unsuccessful in treating her pain. Denies weakness, loss of bowel/bladder function or saddle anesthesia. Denies neck stiffness, headache, rash.  Denies fever or recent procedures to back.  Denies injury..  Past Medical History  Diagnosis Date  . Night sweats   . Fatigue   . Wears glasses   . SOB (shortness of breath) on exertion     walking and stairts  . Arthritis   . Hot flashes   . Blood transfusion   . Diverticulitis of colon   . Headache   . Anemia   . Breast cancer     T3N1 invasive ductal carcinoma left breast  . Bursitis   . Carpal tunnel syndrome     Past Surgical History  Procedure Date  . Cholecystectomy   . Appendectomy   . Carpal tunnel release     Bilateral  . Tonsillectomy   . Spur     Apex spur on both big toes  . Knee surgery     Left Knee  . Portacath placement 06/18/2011    Procedure: INSERTION PORT-A-CATH;  Surgeon: Mariella Saa, MD;  Location: [facility];  Service: General;  Laterality: Right;  right subclavian  . Abdominal hysterectomy     still has ovaries  . Axillary lymph node dissection 11/28/2011    Procedure: AXILLARY LYMPH NODE DISSECTION;  Surgeon: Mariella Saa, MD;  Location: [facility];   Service: General;  Laterality: Left;  . Breast surgery 2013  . Nasal sinus surgery     Family History  Problem Relation Age of Onset  . Cancer Paternal Grandmother     unknown    History  Substance Use Topics  . Smoking status: Never Smoker   . Smokeless tobacco: Never Used  . Alcohol Use: No    OB History    Grav Para Term Preterm Abortions TAB SAB Ect Mult Living                  Review of Systems Ten systems reviewed and are negative for acute change, except as noted in the HPI.   Allergies  Compazine; Aleve; and Penicillins  Home Medications   Current Outpatient Rx  Name  Route  Sig  Dispense  Refill  . ALPRAZOLAM 0.5 MG PO TABS   Oral   Take 0.5 mg by mouth 2 (two) times daily as needed.         . ANASTROZOLE 1 MG PO TABS   Oral   Take 1 tablet (1 mg total) by mouth daily.   30 tablet   6   . VITAMIN D 2000 UNITS PO CAPS   Oral   Take 1 capsule by mouth daily.         Marland Kitchen  GABAPENTIN 300 MG PO CAPS   Oral   Take 300 mg by mouth 3 (three) times daily as needed. For pain.         Marland Kitchen LORAZEPAM 0.5 MG PO TABS      TAKE 1 TABLET BY MOUTH EVERY 6 HOURS AS NEEDED FOR ANXIETY   30 tablet   0   . PREDNISONE 10 MG PO TABS   Oral   Take 10 mg by mouth 3 (three) times daily.         . TRAMADOL HCL 50 MG PO TABS      TAKE 1 TABLET BY MOUTH EVERY 6 HOURS AS NEEDED FOR PAIN   90 tablet   0   . OXYCODONE-ACETAMINOPHEN 5-325 MG PO TABS   Oral   Take 1-2 tablets by mouth every 4 (four) hours as needed for pain.   10 tablet   0   . PREGABALIN 75 MG PO CAPS   Oral   Take 1 capsule (75 mg total) by mouth 2 (two) times daily.   60 capsule   3     BP 135/93  Pulse 79  Temp 98.9 F (37.2 C) (Oral)  Resp 20  SpO2 100%  Physical Exam  Physical Exam  Nursing note and vitals reviewed. Constitutional: She is oriented to person, place, and time.  Morbidly obese female appears uncomfortable. HENT:  Head: Normocephalic and atraumatic.  Eyes:  Conjunctivae normal and EOM are normal. Pupils are equal, round, and reactive to light. No scleral icterus.  Neck: Normal range of motion.  Cardiovascular: Normal rate, regular rhythm and normal heart sounds.  Exam reveals no gallop and no friction rub.   No murmur heard. Pulmonary/Chest: Effort normal and breath sounds normal. No respiratory distress.  Musculoskeletal: Patient with tenderness to palpation of the left knee thigh hip and lower back musculature.  She has tenderness with passive range of motion.  Patient can extend and flex the knee however range of motion is limited.  Difficulty ambulating.  Antalgic gait. Neurovascularly intact Abdominal: Soft. Bowel sounds are normal. She exhibits no distension and no mass. There is no tenderness. There is no guarding.  Neurological: She is alert and oriented to person, place, and time.  Skin: Skin is warm and dry. She is not diaphoretic.    ED Course  Procedures (including critical care time)  Labs Reviewed - No data to display Dg Hip Complete Right  07/07/2012  *RADIOLOGY REPORT*  Clinical Data: Right hip and right lower leg pain.  History of breast cancer.  RIGHT HIP - COMPLETE 2+ VIEW  Comparison: 08/16/2011.  Findings: Hip joint space is maintained bilaterally.  No significant subchondral sclerosis or cyst formation, nor osteophytosis.  No worrisome lytic or sclerotic lesions. Degenerative changes are seen at the symphysis pubis.  IMPRESSION:  1.  Hips are symmetric.  No acute findings. 2.  Degenerative changes at the symphysis pubis.   Original Report Authenticated By: Leanna Battles, M.D.    Dg Tibia/fibula Right  07/07/2012  *RADIOLOGY REPORT*  Clinical Data: Lower leg pain.  RIGHT TIBIA AND FIBULA - 2 VIEW  Comparison: None.  Findings: Tibia and fibula are intact.  Degenerative changes are incidentally imaged in the knee.  IMPRESSION: No acute osseous abnormality.   Original Report Authenticated By: Leanna Battles, M.D.    Dg Knee  Complete 4 Views Right  07/07/2012  *RADIOLOGY REPORT*  Clinical Data: Right knee pain.  RIGHT KNEE - COMPLETE 4+ VIEW  Comparison: None.  Findings: No fracture or dislocation is noted.  Moderate narrowing of medial joint space is noted with osteophyte formation seen medially and laterally.  No definite joint effusion is noted.  IMPRESSION: Mild to moderate degenerative joint disease.  No acute abnormality seen in the right knee.   Original Report Authenticated By: Lupita Raider.,  M.D.      1. Knee pain   2. Hip pain       MDM   12:27 PM BP 135/93  Pulse 79  Temp 98.9 F (37.2 C) (Oral)  Resp 20  SpO2 100%  Patient with chronic pain.  This appears to be an exacerbation of her pain. Patient with back/knee and hip pain.  No neurological deficits and normal neuro exam.  Patient can walk but states is painful.  No loss of bowel or bladder control.  No concern for cauda equina.  No fever, night sweats, weight loss,IVDU.  RICE protocol and pain medicine indicated and discussed with patient.     At this time there does not appear to be any evidence of an acute emergency medical condition and the patient appears stable for discharge with appropriate outpatient follow up.Diagnosis was discussed with patient who verbalizes understanding and is agreeable to discharge.      Arthor Captain, PA-C 07/07/12 1232

## 2012-07-07 NOTE — ED Notes (Signed)
Pt presenting to ed with c/o right leg pain since Friday pt denies injury at this time. Pt states pain radiates into her hip.

## 2012-07-07 NOTE — ED Notes (Signed)
Patient transported to X-ray 

## 2012-07-10 ENCOUNTER — Encounter (HOSPITAL_BASED_OUTPATIENT_CLINIC_OR_DEPARTMENT_OTHER): Payer: Self-pay | Admitting: *Deleted

## 2012-07-10 NOTE — Progress Notes (Signed)
Pt had br ca-post lump-lt axillary nodes, pac in and out-neuropathy hands and feet and CT. Labs cancer center 07/02/12-wbc alittle low-

## 2012-07-16 ENCOUNTER — Encounter (HOSPITAL_BASED_OUTPATIENT_CLINIC_OR_DEPARTMENT_OTHER): Payer: Self-pay | Admitting: *Deleted

## 2012-07-16 ENCOUNTER — Encounter (HOSPITAL_BASED_OUTPATIENT_CLINIC_OR_DEPARTMENT_OTHER)
Admission: RE | Disposition: A | Discharge status: Home / Self Care | Payer: Self-pay | Source: Ambulatory Visit | Attending: Orthopedic Surgery

## 2012-07-16 ENCOUNTER — Ambulatory Visit (HOSPITAL_BASED_OUTPATIENT_CLINIC_OR_DEPARTMENT_OTHER): Admitting: *Deleted

## 2012-07-16 ENCOUNTER — Ambulatory Visit (HOSPITAL_BASED_OUTPATIENT_CLINIC_OR_DEPARTMENT_OTHER)
Admission: RE | Admit: 2012-07-16 | Discharge: 2012-07-16 | Disposition: A | Discharge status: Home / Self Care | Source: Ambulatory Visit | Attending: Orthopedic Surgery | Admitting: Orthopedic Surgery

## 2012-07-16 DIAGNOSIS — G620 Drug-induced polyneuropathy: Secondary | ICD-10-CM | POA: Insufficient documentation

## 2012-07-16 DIAGNOSIS — T451X5A Adverse effect of antineoplastic and immunosuppressive drugs, initial encounter: Secondary | ICD-10-CM | POA: Insufficient documentation

## 2012-07-16 DIAGNOSIS — Z79899 Other long term (current) drug therapy: Secondary | ICD-10-CM | POA: Insufficient documentation

## 2012-07-16 DIAGNOSIS — Z9221 Personal history of antineoplastic chemotherapy: Secondary | ICD-10-CM | POA: Insufficient documentation

## 2012-07-16 DIAGNOSIS — G5600 Carpal tunnel syndrome, unspecified upper limb: Secondary | ICD-10-CM | POA: Insufficient documentation

## 2012-07-16 DIAGNOSIS — Z853 Personal history of malignant neoplasm of breast: Secondary | ICD-10-CM | POA: Insufficient documentation

## 2012-07-16 LAB — POCT HEMOGLOBIN-HEMACUE: Hemoglobin: 12.3 g/dL (ref 12.0–15.0)

## 2012-07-16 SURGERY — HYPOTHENAR FAT PAD TRANSFER
Anesthesia: General | Site: Hand | Laterality: Right

## 2012-07-16 MED ORDER — MIDAZOLAM HCL 2 MG/2ML IJ SOLN: 1.0000 mg | INTRAMUSCULAR | Status: DC | PRN

## 2012-07-16 MED ORDER — PENTAZOCINE-NALOXONE HCL 50-0.5 MG PO TABS
1.0000 | ORAL_TABLET | Freq: Once | ORAL | Status: AC
  Administered 2012-07-16: 1 via ORAL

## 2012-07-16 MED ORDER — LACTATED RINGERS IV SOLN
INTRAVENOUS | Status: DC
  Administered 2012-07-16 (×2): via INTRAVENOUS

## 2012-07-16 MED ORDER — OXYCODONE HCL 5 MG/5ML PO SOLN: 5.0000 mg | Freq: Once | ORAL | Status: DC | PRN

## 2012-07-16 MED ORDER — ONDANSETRON HCL 4 MG/2ML IJ SOLN
INTRAMUSCULAR | Status: DC | PRN
  Administered 2012-07-16: 4 mg via INTRAVENOUS

## 2012-07-16 MED ORDER — PENTAZOCINE-NALOXONE 50-0.5 MG PO TABS: 1.0000 | ORAL_TABLET | ORAL | Status: DC | PRN

## 2012-07-16 MED ORDER — PROPOFOL 10 MG/ML IV BOLUS
INTRAVENOUS | Status: DC | PRN
  Administered 2012-07-16: 200 mg via INTRAVENOUS

## 2012-07-16 MED ORDER — DEXAMETHASONE SODIUM PHOSPHATE 10 MG/ML IJ SOLN
INTRAMUSCULAR | Status: DC | PRN
  Administered 2012-07-16: 5 mg via INTRAVENOUS

## 2012-07-16 MED ORDER — CHLORHEXIDINE GLUCONATE 4 % EX LIQD: 60.0000 mL | Freq: Once | CUTANEOUS | Status: DC

## 2012-07-16 MED ORDER — HYDROMORPHONE HCL PF 1 MG/ML IJ SOLN: 0.2500 mg | INTRAMUSCULAR | Status: DC | PRN

## 2012-07-16 MED ORDER — FENTANYL CITRATE 0.05 MG/ML IJ SOLN: 50.0000 ug | INTRAMUSCULAR | Status: DC | PRN

## 2012-07-16 MED ORDER — FENTANYL CITRATE 0.05 MG/ML IJ SOLN
INTRAMUSCULAR | Status: DC | PRN
  Administered 2012-07-16: 25 ug via INTRAVENOUS
  Administered 2012-07-16: 100 ug via INTRAVENOUS

## 2012-07-16 MED ORDER — VANCOMYCIN HCL IN DEXTROSE 1-5 GM/200ML-% IV SOLN
1000.0000 mg | INTRAVENOUS | Status: AC
  Administered 2012-07-16: 1000 mg via INTRAVENOUS

## 2012-07-16 MED ORDER — OXYCODONE HCL 5 MG PO TABS: 5.0000 mg | ORAL_TABLET | Freq: Once | ORAL | Status: DC | PRN

## 2012-07-16 MED ORDER — BUPIVACAINE HCL (PF) 0.25 % IJ SOLN
INTRAMUSCULAR | Status: DC | PRN
  Administered 2012-07-16: 9 mL

## 2012-07-16 SURGICAL SUPPLY — 40 items
BANDAGE GAUZE ELAST BULKY 4 IN (GAUZE/BANDAGES/DRESSINGS) ×2 IMPLANT
BLADE MINI RND TIP GREEN BEAV (BLADE) ×2 IMPLANT
BLADE SURG 15 STRL LF DISP TIS (BLADE) ×1 IMPLANT
BLADE SURG 15 STRL SS (BLADE) ×2
BNDG CMPR 9X4 STRL LF SNTH (GAUZE/BANDAGES/DRESSINGS) ×1
BNDG COHESIVE 3X5 TAN STRL LF (GAUZE/BANDAGES/DRESSINGS) ×2 IMPLANT
BNDG ESMARK 4X9 LF (GAUZE/BANDAGES/DRESSINGS) ×2 IMPLANT
CHLORAPREP W/TINT 26ML (MISCELLANEOUS) ×2 IMPLANT
CLOTH BEACON ORANGE TIMEOUT ST (SAFETY) ×2 IMPLANT
CORDS BIPOLAR (ELECTRODE) ×2 IMPLANT
COVER MAYO STAND STRL (DRAPES) ×2 IMPLANT
COVER TABLE BACK 60X90 (DRAPES) ×2 IMPLANT
CUFF TOURNIQUET SINGLE 18IN (TOURNIQUET CUFF) ×2 IMPLANT
DRAPE EXTREMITY T 121X128X90 (DRAPE) ×2 IMPLANT
DRAPE SURG 17X23 STRL (DRAPES) ×2 IMPLANT
DRSG KUZMA FLUFF (GAUZE/BANDAGES/DRESSINGS) ×2 IMPLANT
GAUZE XEROFORM 1X8 LF (GAUZE/BANDAGES/DRESSINGS) ×2 IMPLANT
GLOVE BIO SURGEON STRL SZ 6.5 (GLOVE) ×2 IMPLANT
GLOVE BIOGEL PI IND STRL 8.5 (GLOVE) ×1 IMPLANT
GLOVE BIOGEL PI INDICATOR 8.5 (GLOVE) ×1
GLOVE SURG ORTHO 8.0 STRL STRW (GLOVE) ×2 IMPLANT
GOWN BRE IMP PREV XXLGXLNG (GOWN DISPOSABLE) ×2 IMPLANT
GOWN PREVENTION PLUS XLARGE (GOWN DISPOSABLE) ×2 IMPLANT
NEEDLE 27GAX1X1/2 (NEEDLE) ×2 IMPLANT
NS IRRIG 1000ML POUR BTL (IV SOLUTION) ×2 IMPLANT
PACK BASIN DAY SURGERY FS (CUSTOM PROCEDURE TRAY) ×2 IMPLANT
PAD CAST 3X4 CTTN HI CHSV (CAST SUPPLIES) ×1 IMPLANT
PADDING CAST ABS 4INX4YD NS (CAST SUPPLIES) ×1
PADDING CAST ABS COTTON 4X4 ST (CAST SUPPLIES) ×1 IMPLANT
PADDING CAST COTTON 3X4 STRL (CAST SUPPLIES) ×2
SPLINT PLASTER CAST XFAST 3X15 (CAST SUPPLIES) ×5 IMPLANT
SPLINT PLASTER XTRA FASTSET 3X (CAST SUPPLIES) ×5
SPONGE GAUZE 4X4 12PLY (GAUZE/BANDAGES/DRESSINGS) ×2 IMPLANT
STOCKINETTE 4X48 STRL (DRAPES) ×2 IMPLANT
SUT VICRYL 4-0 PS2 18IN ABS (SUTURE) IMPLANT
SUT VICRYL RAPIDE 4/0 PS 2 (SUTURE) ×4 IMPLANT
SYR BULB 3OZ (MISCELLANEOUS) ×2 IMPLANT
SYR CONTROL 10ML LL (SYRINGE) ×2 IMPLANT
TOWEL OR 17X24 6PK STRL BLUE (TOWEL DISPOSABLE) ×2 IMPLANT
UNDERPAD 30X30 INCONTINENT (UNDERPADS AND DIAPERS) ×2 IMPLANT

## 2012-07-16 NOTE — Progress Notes (Signed)
R neck EJ PIV discontinud-pressure applied to site x 5 minutes-pt tolerated well. Pressure dressing applied to site CDI at present. Pt. advised to leave dressing on until evening-pt verbalized understanding.

## 2012-07-16 NOTE — Transfer of Care (Signed)
Immediate Anesthesia Transfer of Care Note  Patient: Kelsey Wilson  Procedure(s) Performed: Procedure(s) with comments: HYPOTHENAR FAT PAD TRANSFER with carpal tunnel release (Right) - WITH HYPOTHENAR SAT PAD TRANSFER  Patient Location3  PACU  Anesthesia Type:General  Level of Consciousness: awake, alert  and oriented  Airway & Oxygen Therapy: Patient Spontanous Breathing and Patient connected to face mask oxygen  Post-op Assessment: Report given to PACU RN, Post -op Vital signs reviewed and stable and Patient moving all extremities  Post vital signs: Reviewed and stable  Complications: No apparent anesthesia complications

## 2012-07-16 NOTE — Anesthesia Postprocedure Evaluation (Signed)
  Anesthesia Post-op Note  Patient: Kelsey Wilson  Procedure(s) Performed: Procedure(s) with comments: HYPOTHENAR FAT PAD TRANSFER with carpal tunnel release (Right) - WITH HYPOTHENAR SAT PAD TRANSFER  Patient Location: PACU  Anesthesia Type:General  Level of Consciousness: awake, alert  and oriented  Airway and Oxygen Therapy: Patient Spontanous Breathing  Post-op Pain: mild  Post-op Assessment: Post-op Vital signs reviewed, Patient's Cardiovascular Status Stable, Respiratory Function Stable, Patent Airway and No signs of Nausea or vomiting  Post-op Vital Signs: Reviewed and stable  Complications: No apparent anesthesia complications

## 2012-07-16 NOTE — Op Note (Signed)
Kelsey Wilson, Kelsey Wilson              ACCOUNT NO.:  [id number]  MEDICAL RECORD NO.:  [id number]  LOCATION:                                 FACILITY:  PHYSICIAN:  Cindee Salt, M.D.       DATE OF BIRTH:  04-03-1961  DATE OF PROCEDURE:  07/16/2012 DATE OF DISCHARGE:                              OPERATIVE REPORT   PREOPERATIVE DIAGNOSIS:  Recurrent carpal tunnel syndrome, right hand.  POSTOPERATIVE DIAGNOSIS:  Recurrent carpal tunnel syndrome, right hand.  OPERATION:  Decompression with thenar fat pad transfer, right carpal canal.  SURGEON:  Cindee Salt, MD  ANESTHESIA:  General with local infiltration.  ANESTHESIOLOGIST:  Zenon Mayo, MD  HISTORY:  The patient is a 52 year old female with a history of carpal tunnel syndrome.  Nerve conduction is positive.  This was released multiple years ago.  She has had a pain-free interval and has had symptoms.  This occurred after chemotherapy for breast surgery.  Nerve conductions show worsening.  She has elected to proceed to have this surgically released.  Pre, peri, and postoperative course were discussed along with risks and complications.  She is aware there is no guarantee with surgery; possibility of infection; recurrence of injury to arteries, nerves, tendons, incomplete relief of symptoms, dystrophy.  In the preoperative area, the patient is seen, the extremity marked by both patient and surgeon.  Antibiotic given.  DESCRIPTION OF PROCEDURE:  The patient was brought to the operating room where a general anesthetic was carried out without difficulty.  She was prepped using ChloraPrep, supine position with the right arm free.  A 3- minute dry time was allowed.  Time-out taken, confirmed patient and procedure.  The limb was exsanguinated with an Esmarch bandage. Tourniquet placed high on the arm was inflated to 250 mmHg.  A longitudinal incision was made in the palm, carried to the ulnar side of the wrist and then on to distal  forearm, proximal and distal to the prior scar.  This was carried down through subcutaneous tissue. Bleeders were electrocauterized with bipolar.  The median nerve was identified proximally.  This was traced distally.  A moderate amount of scarring was present about the nerve.  Neurolysis was performed.  Motor branch was noted to enter into muscle.  The hypothenar fat pad was then elevated with blunt and sharp dissection, taking care to protect the ulnar artery and ulnar nerve.  This was maintained radially.  This was then transferred over the median nerve after irrigation and sutured into position with interrupted 4-0 Vicryl sutures.  This covered the entire nerve over the entire course.  The wound was again irrigated.  The subcutaneous tissue available was closed with interrupted 4-0 Vicryl and the skin with interrupted 4-0 Vicryl Rapide sutures.  Sterile compressive dressing, volar wrist splint applied with the fingers free.  On deflation of the tourniquet, all fingers immediately pinked.  She was taken to the recovery room for observation in satisfactory condition.  She will be discharged home to return to [facility] in 1 week on Talwin.          ______________________________ Cindee Salt, M.D.     GK/MEDQ  D:  07/16/2012  T:  07/16/2012  Job:  [id number]

## 2012-07-16 NOTE — Brief Op Note (Signed)
07/16/2012  12:56 PM  PATIENT:  [name]  52 y.o. female  PRE-OPERATIVE DIAGNOSIS:  RECURRENT CARPAL TUNNEL SYNDROME RIGHT  POST-OPERATIVE DIAGNOSIS:  RECURRENT CARPAL TUNNEL SYNDROME RIGHT  PROCEDURE:  Procedure(s) with comments: HYPOTHENAR FAT PAD TRANSFER with carpal tunnel release (Right) - WITH HYPOTHENAR SAT PAD TRANSFER  SURGEON:  Surgeon(s) and Role:    * Nicki Reaper, MD - Primary  PHYSICIAN ASSISTANT:   ASSISTANTS: none   ANESTHESIA:   local and general  EBL:  Total I/O In: 800 [I.V.:800] Out: -   BLOOD ADMINISTERED:none  DRAINS: none   LOCAL MEDICATIONS USED:  MARCAINE     SPECIMEN:  No Specimen  DISPOSITION OF SPECIMEN:  N/A  COUNTS:  YES  TOURNIQUET:   Total Tourniquet Time Documented: Upper Arm (Right) - 42 minutes Total: Upper Arm (Right) - 42 minutes   DICTATION: .Other Dictation: Dictation Number [id number]  PLAN OF CARE: Discharge to home after PACU  PATIENT DISPOSITION:  PACU - hemodynamically stable.

## 2012-07-16 NOTE — Progress Notes (Signed)
Prescription called to pharmacy as per pt request-Talwin 1 tablet every 4 hours as needed for pain-#30 no refills.Pharmacist to fill.

## 2012-07-16 NOTE — Anesthesia Preprocedure Evaluation (Addendum)
Anesthesia Evaluation  Patient identified by MRN, date of birth, ID band Patient awake    Reviewed: Allergy & Precautions, H&P , NPO status , Patient's Chart, lab work & pertinent test results  Airway Mallampati: II TM Distance: >3 FB Neck ROM: Full    Dental no notable dental hx. (+) Teeth Intact and Dental Advisory Given   Pulmonary neg pulmonary ROS, shortness of breath,  breath sounds clear to auscultation  Pulmonary exam normal       Cardiovascular negative cardio ROS  Rhythm:Regular Rate:Normal     Neuro/Psych  Headaches,  Neuromuscular disease negative psych ROS   GI/Hepatic negative GI ROS, Neg liver ROS,   Endo/Other  negative endocrine ROS  Renal/GU negative Renal ROS  negative genitourinary   Musculoskeletal   Abdominal   Peds  Hematology negative hematology ROS (+)   Anesthesia Other Findings   Reproductive/Obstetrics negative OB ROS                           Anesthesia Physical Anesthesia Plan  ASA: II  Anesthesia Plan: General   Post-op Pain Management:    Induction: Intravenous  Airway Management Planned: LMA  Additional Equipment:   Intra-op Plan:   Post-operative Plan: Extubation in OR  Informed Consent: I have reviewed the patients History and Physical, chart, labs and discussed the procedure including the risks, benefits and alternatives for the proposed anesthesia with the patient or authorized representative who has indicated his/her understanding and acceptance.   Dental advisory given  Plan Discussed with: CRNA  Anesthesia Plan Comments:         Anesthesia Quick Evaluation

## 2012-07-16 NOTE — Op Note (Signed)
Dictated [id number]

## 2012-07-16 NOTE — H&P (Signed)
Kelsey Wilson is a 52 year old right hand dominant female seen at the request of Dr. Terrace Arabia  with respect to numbness and tingling and pain both hands. She is disabled secondary to peripheral neuropathy following chemotherapy for breast cancer bilaterally. This began 8 months ago following completion of her chemotherapy. She has had bilateral carpal tunnel releases done by Dr. Montez Morita many years ago. She did not have nerve conductions done prior to that time. She has been placed on Lyrica, Gabapentin, Baclofen, Tramadol for numbness and tingling. She is complaining of weakness. She has no history of injury to the hand or neck. She is awakened 7 out of 7 nights. She complains of constant extremely severe, throbbing and aching pain with a feeling of swelling, numbness and weakness. She states it is getting worse. Activity and exercise makes it worse and nothing seems to improve this. , She had little to now relief following the injection of her right carpal canal.  We have discussed with her that we cannot guarantee that this is not a neuropathy causing her problems vs. a recurrent carpal tunnel.  She does have positive nerve conductions.    PAST MEDICAL HISTORY: She is allergic to PCN and Aleve. She takes the following medications: Citalopram, Tramadol, Lyrica, Anastrozole, Lorazepam, Baclofen, Peridin-C and vitamins.  She has had the breast surgery in June 2013 and does have lymphedema on her left side. She has had a hysterectomy, appendectomy, cholecystectomy, tonsillectomy, portal, and carpal tunnel release and foot surgery.  FAMILY H ISTORY: Positive for high BP, arthritis.  SOCIAL HISTORY: She does not smoke or drink. 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  REVIEW OF SYSTEMS: Positive for bilateral breast cancer, glasses, hoarseness, otherwise negative for 14 points.  Kelsey Wilson is an 52 y.o. female.   Chief Complaint: Recurrent CTS Rt HPI: see above  Past Medical History  Diagnosis Date  .  Night sweats   . Fatigue   . Wears glasses   . SOB (shortness of breath) on exertion     walking and stairts  . Arthritis   . Hot flashes   . Blood transfusion   . Diverticulitis of colon   . Headache   . Anemia   . Breast cancer     T3N1 invasive ductal carcinoma left breast  . Bursitis   . Carpal tunnel syndrome     Past Surgical History  Procedure Laterality Date  . Cholecystectomy    . Appendectomy    . Carpal tunnel release      Bilateral  . Tonsillectomy    . Spur      Apex spur on both big toes  . Knee surgery      Left Knee  . Portacath placement  06/18/2011    Procedure: INSERTION PORT-A-CATH;  Surgeon: Mariella Saa, MD;  Location: [facility];  Service: General;  Laterality: Right;  right subclavian  . Abdominal hysterectomy      still has ovaries  . Axillary lymph node dissection  11/28/2011    Procedure: AXILLARY LYMPH NODE DISSECTION;  Surgeon: Mariella Saa, MD;  Location: [facility] OR;  Service: General;  Laterality: Left;  . Breast surgery  2013  . Nasal sinus surgery      Family History  Problem Relation Age of Onset  . Cancer Paternal Grandmother     unknown   Social History:  reports that she has never smoked. She has never used smokeless tobacco. She reports that she does not  drink alcohol or use illicit drugs.  Allergies:  Allergies  Allergen Reactions  . Compazine Other (See Comments)    Numbness of face and lips  . Aleve (Naproxen) Nausea Only  . Penicillins Nausea Only    Medications Prior to Admission  Medication Sig Dispense Refill  . anastrozole (ARIMIDEX) 1 MG tablet Take 1 tablet (1 mg total) by mouth daily.  30 tablet  6  . Cholecalciferol (VITAMIN D) 2000 UNITS CAPS Take 1 capsule by mouth daily.      Marland Kitchen LORazepam (ATIVAN) 0.5 MG tablet TAKE 1 TABLET BY MOUTH EVERY 6 HOURS AS NEEDED  30 tablet  0  . oxyCODONE-acetaminophen (PERCOCET) 5-325 MG per tablet Take 1-2 tablets by mouth every 4 (four) hours as needed  for pain.  10 tablet  0  . pregabalin (LYRICA) 75 MG capsule Take 1 capsule (75 mg total) by mouth 2 (two) times daily.  60 capsule  3  . traMADol (ULTRAM) 50 MG tablet TAKE 1 TABLET BY MOUTH EVERY 6 HOURS AS NEEDED FOR PAIN  90 tablet  0    No results found for this or any previous visit (from the past 48 hour(s)).  No results found.   Pertinent items are noted in HPI.  Blood pressure 122/76, pulse 67, temperature 98.3 F (36.8 C), temperature source Oral, resp. rate 18, height 5\' 3"  (1.6 m), weight 100.336 kg (221 lb 3.2 oz), SpO2 100.00%.  General appearance: alert, cooperative and appears stated age Head: Normocephalic, without obvious abnormality Neck: no JVD Resp: clear to auscultation bilaterally Cardio: regular rate and rhythm, S1, S2 normal, no murmur, click, rub or gallop GI: soft, non-tender; bowel sounds normal; no masses,  no organomegaly Extremities: extremities normal, atraumatic, no cyanosis or edema Pulses: 2+ and symmetric Skin: Skin color, texture, turgor normal. No rashes or lesions Neurologic: Grossly normal Incision/Wound: na  Assessment/Plan X-rays of her hand reveals degenerative changes CMC joints of the thumb, distal radial ulnar joint, otherwise negative.  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 (2) Questionable carpal tunnel syndrome. (3) Degenerative arthritis.  She is advised it is difficult to determine the extent of the median nerve being compressed on nerve conductions and these may not revert back to normal following carpal tunnel release in that she had no nerve conduction studies done by Dr. Montez Morita prior to release We have discussed with her that we cannot guarantee that this is not a neuropathy causing her problems vs. a recurrent carpal tunnel.  She does have positive nerve conductions.  She is advised if surgery is undertaken that this may not resolve symptoms.  We would recommend hypothenar fat pad transfer along  with repeat carpal tunnel release.   Her breast surgery was on the left side.  She would like to have her right side done.  She is aware there is potential for recurrence, injury to arteries, nerves, tendons, incomplete relief of symptoms and dystrophy.  She would like to proceed.  She is scheduled for carpal tunnel release with hypothenar fat pad transfer right hand as an outpatient.   Davone Shinault R 07/16/2012, 11:29 AM

## 2012-07-16 NOTE — Anesthesia Procedure Notes (Signed)
Procedure Name: LMA Insertion Date/Time: 07/16/2012 11:57 AM Performed by: Meyer Russel Pre-anesthesia Checklist: Patient identified, Emergency Drugs available, Suction available and Patient being monitored Patient Re-evaluated:Patient Re-evaluated prior to inductionOxygen Delivery Method: Circle System Utilized Preoxygenation: Pre-oxygenation with 100% oxygen Intubation Type: IV induction Ventilation: Mask ventilation without difficulty LMA: LMA inserted LMA Size: 4.0 Number of attempts: 1 Airway Equipment and Method: bite block Placement Confirmation: positive ETCO2 and breath sounds checked- equal and bilateral Tube secured with: Tape Dental Injury: Teeth and Oropharynx as per pre-operative assessment

## 2012-07-25 ENCOUNTER — Encounter: Payer: Self-pay | Admitting: Physical Medicine & Rehabilitation

## 2012-08-02 DIAGNOSIS — M797 Fibromyalgia: Secondary | ICD-10-CM

## 2012-08-02 HISTORY — DX: Fibromyalgia: M79.7

## 2012-08-05 ENCOUNTER — Other Ambulatory Visit: Payer: Self-pay | Admitting: *Deleted

## 2012-08-05 DIAGNOSIS — C50412 Malignant neoplasm of upper-outer quadrant of left female breast: Secondary | ICD-10-CM

## 2012-08-05 MED ORDER — PREGABALIN 75 MG PO CAPS: 75.0000 mg | ORAL_CAPSULE | Freq: Two times a day (BID) | ORAL | Status: DC

## 2012-08-13 ENCOUNTER — Ambulatory Visit: Admitting: Oncology

## 2012-08-13 ENCOUNTER — Other Ambulatory Visit: Admitting: Lab

## 2012-08-15 ENCOUNTER — Encounter: Payer: Self-pay | Admitting: Physical Medicine & Rehabilitation

## 2012-08-15 ENCOUNTER — Encounter: Attending: Physical Medicine & Rehabilitation

## 2012-08-15 ENCOUNTER — Ambulatory Visit (HOSPITAL_BASED_OUTPATIENT_CLINIC_OR_DEPARTMENT_OTHER): Payer: Medicaid Other | Admitting: Physical Medicine & Rehabilitation

## 2012-08-15 VITALS — BP 111/75 | HR 75 | Resp 14 | Ht 62.0 in | Wt 223.0 lb

## 2012-08-15 DIAGNOSIS — Z79899 Other long term (current) drug therapy: Secondary | ICD-10-CM | POA: Insufficient documentation

## 2012-08-15 DIAGNOSIS — Z923 Personal history of irradiation: Secondary | ICD-10-CM | POA: Insufficient documentation

## 2012-08-15 DIAGNOSIS — Z5181 Encounter for therapeutic drug level monitoring: Secondary | ICD-10-CM

## 2012-08-15 DIAGNOSIS — IMO0002 Reserved for concepts with insufficient information to code with codable children: Secondary | ICD-10-CM | POA: Insufficient documentation

## 2012-08-15 DIAGNOSIS — IMO0001 Reserved for inherently not codable concepts without codable children: Secondary | ICD-10-CM

## 2012-08-15 DIAGNOSIS — R52 Pain, unspecified: Secondary | ICD-10-CM | POA: Insufficient documentation

## 2012-08-15 DIAGNOSIS — C50919 Malignant neoplasm of unspecified site of unspecified female breast: Secondary | ICD-10-CM | POA: Insufficient documentation

## 2012-08-15 DIAGNOSIS — Z9221 Personal history of antineoplastic chemotherapy: Secondary | ICD-10-CM | POA: Insufficient documentation

## 2012-08-15 DIAGNOSIS — C50412 Malignant neoplasm of upper-outer quadrant of left female breast: Secondary | ICD-10-CM

## 2012-08-15 DIAGNOSIS — G5690 Unspecified mononeuropathy of unspecified upper limb: Secondary | ICD-10-CM | POA: Insufficient documentation

## 2012-08-15 DIAGNOSIS — M797 Fibromyalgia: Secondary | ICD-10-CM | POA: Insufficient documentation

## 2012-08-15 DIAGNOSIS — G622 Polyneuropathy due to other toxic agents: Secondary | ICD-10-CM

## 2012-08-15 DIAGNOSIS — C50419 Malignant neoplasm of upper-outer quadrant of unspecified female breast: Secondary | ICD-10-CM

## 2012-08-15 DIAGNOSIS — G5790 Unspecified mononeuropathy of unspecified lower limb: Secondary | ICD-10-CM | POA: Insufficient documentation

## 2012-08-15 NOTE — Patient Instructions (Signed)
Since he just filled Lyrica I will just have you increase it to 3 times a day  Continue hand therapy Your main diagnosis is peripheral neuropathy pain I think you have fibromyalgia which worsened

## 2012-08-15 NOTE — Progress Notes (Signed)
Subjective:    Patient ID: Kelsey Wilson, female    DOB: Oct 24, 1960, 52 y.o.   MRN: [id number] Chief complaint is neuropathy pain in the hands and feet   HPI Past history of invasive ductal carcinoma of the left breast with positive axillary lymph nodes status post lumpectomy and axillary dissection. Postoperative radiation therapy, postoperative chemotherapy. Currently on Arimidex  Pain around site of port removal right chest  Aching all over body spasms in legs. Patient states that she has had this even before her cancer diagnosis. Worsening since cancer diagnosis. Cannot definitely tell whether or not it started right after starting on anastrozole  Last onc note from Dr Darnelle Catalan 1) status post left breast biopsy 06/01/2011 for a cT2 pN1 invasive ductal carcinoma, grade 3, estrogen receptor 80% and progesterone receptor 9% positive, with no HER-2 amplification and an MIB-1 of 100%  (2) treated neoadjuvant [place] with 4 cycles of cyclophosphamide, epirubicin and fluorouracil, followed by 4 cycles of docetaxel, completed 11/01/2011  (3) status post left lumpectomy and axillary lymph node resection 11/28/2011, showing a complete pathologic response (no residual invasive or in situ cancer in the breast and 0 of 17 lymph nodes involved)  (4) adjuvant radiation therapy completed 03/31/2012  (5) started anastrozole November 2013; normal bone density scan 04/09/2012  PLAN: We spent the better part of her hour-long visit today discussing the nature of breast cancer, and the details of her tumor and its treatment. She understands that she is currently in remission, and we do not have any evidence of active cancer. She is tolerating the anastrozole moderately well, although it might be difficult to tell since she has chronic problems with pain at multiple sites, carpal tunnel, and muscle spasms.       Pain Inventory Average Pain 9 Pain Right Now 10 My pain is burning, tingling and aching  In  the last 24 hours, has pain interfered with the following? General activity 8 Relation with others 8 Enjoyment of life 10 What TIME of day is your pain at its worst? daytime Sleep (in general) Poor  Pain is worse with: walking, bending, sitting, inactivity, standing and some activites Pain improves with: rest, heat/ice, therapy/exercise and pacing activities Relief from Meds: 0  Mobility use a cane ability to climb steps?  yes do you drive?  yes  Function not employed: date last employed 13 I need assistance with the following:  meal prep, household duties and shopping  Neuro/Psych weakness numbness tingling trouble walking spasms confusion anxiety  Prior Studies x-rays  Physicians involved in your care Sami Cecil, Cindee Salt   Family History  Problem Relation Age of Onset  . Cancer Paternal Grandmother     unknown  . Hypertension Mother   . Diabetes Mother   . Hypertension Father   . Diabetes Father    History   Social History  . Marital Status: Single    Spouse Name: N/A    Number of Children: N/A  . Years of Education: N/A   Social History Main Topics  . Smoking status: Never Smoker   . Smokeless tobacco: Never Used  . Alcohol Use: No  . Drug Use: No  . Sexually Active: No   Other Topics Concern  . None   Social History Narrative  . None   Past Surgical History  Procedure Laterality Date  . Cholecystectomy    . Appendectomy    . Carpal tunnel release      Bilateral  . Tonsillectomy    .  Spur      Apex spur on both big toes  . Knee surgery      Left Knee  . Portacath placement  06/18/2011    Procedure: INSERTION PORT-A-CATH;  Surgeon: Mariella Saa, MD;  Location: [facility];  Service: General;  Laterality: Right;  right subclavian  . Abdominal hysterectomy      still has ovaries  . Axillary lymph node dissection  11/28/2011    Procedure: AXILLARY LYMPH NODE DISSECTION;  Surgeon: Mariella Saa, MD;   Location: [facility] OR;  Service: General;  Laterality: Left;  . Breast surgery  2013  . Nasal sinus surgery     Past Medical History  Diagnosis Date  . Night sweats   . Fatigue   . Wears glasses   . SOB (shortness of breath) on exertion     walking and stairts  . Arthritis   . Hot flashes   . Blood transfusion   . Diverticulitis of colon   . Headache   . Anemia   . Breast cancer     T3N1 invasive ductal carcinoma left breast  . Bursitis   . Carpal tunnel syndrome    BP 111/75  Pulse 75  Resp 14  Ht 5\' 2"  (1.575 m)  Wt 223 lb (101.152 kg)  BMI 40.78 kg/m2  SpO2 95%     Review of Systems  Constitutional: Positive for diaphoresis.  Respiratory: Positive for cough and shortness of breath.   Gastrointestinal: Positive for abdominal pain.  Musculoskeletal: Positive for gait problem.  Neurological: Positive for weakness and numbness.  Psychiatric/Behavioral: The patient is nervous/anxious.   All other systems reviewed and are negative.       Objective:   Physical Exam  Nursing note and vitals reviewed. Constitutional: She is oriented to person, place, and time. She appears well-developed.  Obese  HENT:  Head: Normocephalic and atraumatic.  Right Ear: External ear normal.  Left Ear: External ear normal.  Eyes: Conjunctivae and EOM are normal. Pupils are equal, round, and reactive to light.  Neck: Normal range of motion.  Musculoskeletal:       Right hip: She exhibits decreased range of motion.       Left hip: She exhibits decreased range of motion.       Cervical back: Normal.       Lumbar back: She exhibits decreased range of motion.  Negative straight leg raising test bilateral  Neurological: She is alert and oriented to person, place, and time. She has normal reflexes.  Psychiatric: She has a normal mood and affect.    Tenderness 18/18 fibromyalgia tender points Lumbar spine range of motion is reduced with for flexion extension lateral patient  bending Sensation is hyperesthetic to pinprick bilateral hands and feet She has decreased sensation at bilateral feet below the ankles Reduced sensation to pinprick in the fingers, Forearms and arms Motor strength is 4+/5 in bilateral deltoid, biceps, triceps, grip, hip flexor, knee extensors, ankle dorsiflexor and plantar flexor Skin shows a healing incision over the right volar wrist Mood and affect are flat        Assessment & Plan:  1. Diffuse body pain which predated her breast cancer diagnosis. This patient has probable fibromyalgia syndrome and would benefit from a higher dose of Lyrica. Will increase to 75 3 times a day.We may need to titrate upward from there. Her symptoms may be amplified by the anostrazole 2. 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 She reports having had an  EMG but does not have the results with her. We will see if we can get these results from [facility] neurology. Her upper extremity sensory pinprick loss does not appear to be physiologic. She has decreased sensation along the entire arm above and below the elbow where as her peripheral neuropathy symptoms in the lower tremor these are confined below the ankles. Paresthesias may result from fibromyalgia syndrome. Overall will avoid narcotic analgesics, Patient has had hallucinations from oxycodone and states that hydrocodone has not been helpful for her.

## 2012-08-29 ENCOUNTER — Encounter: Payer: Self-pay | Admitting: Physical Medicine & Rehabilitation

## 2012-08-29 ENCOUNTER — Encounter

## 2012-08-29 ENCOUNTER — Ambulatory Visit (HOSPITAL_BASED_OUTPATIENT_CLINIC_OR_DEPARTMENT_OTHER): Admitting: Physical Medicine & Rehabilitation

## 2012-08-29 VITALS — BP 114/71 | HR 80 | Ht 63.0 in | Wt 223.8 lb

## 2012-08-29 DIAGNOSIS — M797 Fibromyalgia: Secondary | ICD-10-CM

## 2012-08-29 DIAGNOSIS — IMO0002 Reserved for concepts with insufficient information to code with codable children: Secondary | ICD-10-CM

## 2012-08-29 DIAGNOSIS — G622 Polyneuropathy due to other toxic agents: Secondary | ICD-10-CM

## 2012-08-29 DIAGNOSIS — C50419 Malignant neoplasm of upper-outer quadrant of unspecified female breast: Secondary | ICD-10-CM

## 2012-08-29 DIAGNOSIS — IMO0001 Reserved for inherently not codable concepts without codable children: Secondary | ICD-10-CM

## 2012-08-29 DIAGNOSIS — C50412 Malignant neoplasm of upper-outer quadrant of left female breast: Secondary | ICD-10-CM

## 2012-08-29 MED ORDER — PREGABALIN 150 MG PO CAPS: 150.0000 mg | ORAL_CAPSULE | Freq: Two times a day (BID) | ORAL | Status: DC

## 2012-08-29 NOTE — Patient Instructions (Addendum)

## 2012-08-29 NOTE — Progress Notes (Signed)
Subjective:    Patient ID: Kelsey Wilson, female    DOB: June 12, 1960, 52 y.o.   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 Postoperative radiation therapy, postoperative chemotherapy. Currently on Arimidex  Pain around site of port removal right chest  Aching all over body spasms in legs. Patient states that she has had this even before her cancer diagnosis. Worsening since cancer diagnosis. Cannot definitely tell whether or not it started right after starting on anastrozole  Complaints of bilateral foot and hand pain Also has all over body aching Pain Inventory Average Pain 9 Pain Right Now 9 My pain is tingling and aching  In the last 24 hours, has pain interfered with the following? General activity 5 Relation with others 5 Enjoyment of life 5 What TIME of day is your pain at its worst? all Sleep (in general) Poor  Pain is worse with: walking, bending, sitting, inactivity, standing and some activites Pain improves with: medication Relief from Meds: 0  Mobility use a cane ability to climb steps?  yes  Function not employed: date last employed 2012 I need assistance with the following:  meal prep, household duties and shopping  Neuro/Psych numbness tingling trouble walking spasms depression anxiety  Prior Studies Any changes since last visit?  no  Physicians involved in your care Any changes since last visit?  no   Family History  Problem Relation Age of Onset  . Cancer Paternal Grandmother     unknown  . Hypertension Mother   . Diabetes Mother   . Hypertension Father   . Diabetes Father    History   Social History  . Marital Status: Single    Spouse Name: N/A    Number of Children: N/A  . Years of Education: N/A   Social History Main Topics  . Smoking status: Never Smoker   . Smokeless tobacco: Never Used  . Alcohol Use: No   . Drug Use: No  . Sexually Active: No   Other Topics Concern  . None   Social History Narrative  . None   Past Surgical History  Procedure Laterality Date  . Cholecystectomy    . Appendectomy    . Carpal tunnel release      Bilateral  . Tonsillectomy    . Spur      Apex spur on both big toes  . Knee surgery      Left Knee  . Portacath placement  06/18/2011    Procedure: INSERTION PORT-A-CATH;  Surgeon: Mariella Saa, MD;  Location: [facility];  Service: General;  Laterality: Right;  right subclavian  . Abdominal hysterectomy      still has ovaries  . Axillary lymph node dissection  11/28/2011    Procedure: AXILLARY LYMPH NODE DISSECTION;  Surgeon: Mariella Saa, MD;  Location: [facility];  Service: General;  Laterality: Left;  . Breast surgery  2013  . Nasal sinus surgery     Past Medical History  Diagnosis Date  . Night sweats   . Fatigue   . Wears glasses   . SOB (shortness of breath) on exertion     walking and stairts  . Arthritis   . Hot flashes   . Blood transfusion   . Diverticulitis of colon   . Headache   . Anemia   . Breast cancer     T3N1 invasive ductal carcinoma left  breast  . Bursitis   . Carpal tunnel syndrome    BP 114/71  Pulse 80  Ht 5\' 3"  (1.6 m)  Wt 223 lb 12.8 oz (101.515 kg)  BMI 39.65 kg/m2  SpO2 98%    Review of Systems  Constitutional: Positive for diaphoresis.  Musculoskeletal: Positive for gait problem.       Spasms  Neurological: Positive for numbness.       Tingling  Psychiatric/Behavioral: Positive for dysphoric mood. The patient is nervous/anxious.   All other systems reviewed and are negative.       Objective:   Physical Exam  Nursing note and vitals reviewed. Constitutional: She is oriented to person, place, and time. She appears well-developed.  Morbid obesity  HENT:  Head: Normocephalic and atraumatic.  Eyes: Conjunctivae and EOM are normal. Pupils are equal, round, and reactive to  light.  Neck: Neck supple.  Musculoskeletal:       Right wrist: She exhibits decreased range of motion, tenderness and deformity.       Right ankle: Normal.       Left ankle: Normal.       Right foot: She exhibits decreased range of motion, tenderness, bony tenderness and deformity.       Left foot: She exhibits decreased range of motion, tenderness, bony tenderness and deformity.  Bilateral foot intrinsic atrophy Healing scar right carpal tunnel incision  Neurological: She is alert and oriented to person, place, and time. She has normal reflexes.  Psychiatric: Her affect is blunt.          Assessment & Plan:  1. Diffuse body pain which predated her breast cancer diagnosis. This patient has probable fibromyalgia syndrome and would benefit from a higher dose of Lyrica. Will increase to 150 2 times a day.We may need to titrate upward to TID next visit if not much better.  Usual effective dose for fibromyalgia is 450 mg per day with Max dose of 600 mg per day   Her symptoms may be amplified by the anostrazole  2. 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  May benefit from exercise instruction. Will have my PA Clydie Braun who is also a PT assist with this

## 2012-09-04 ENCOUNTER — Other Ambulatory Visit: Payer: Self-pay | Admitting: Oncology

## 2012-09-05 ENCOUNTER — Telehealth: Payer: Self-pay

## 2012-09-05 NOTE — Telephone Encounter (Signed)
Refill reqeust for lyrica.  Per patient she thinks that she was starting a new dose.  Left message for patient to call office regarding this.

## 2012-09-08 NOTE — Telephone Encounter (Signed)
Lyrica for 150mg  called into [place] out patient, patient aware.

## 2012-09-18 ENCOUNTER — Ambulatory Visit (HOSPITAL_BASED_OUTPATIENT_CLINIC_OR_DEPARTMENT_OTHER): Admitting: Family

## 2012-09-18 ENCOUNTER — Other Ambulatory Visit (HOSPITAL_BASED_OUTPATIENT_CLINIC_OR_DEPARTMENT_OTHER): Admitting: Lab

## 2012-09-18 ENCOUNTER — Encounter: Payer: Self-pay | Admitting: Family

## 2012-09-18 ENCOUNTER — Telehealth: Payer: Self-pay | Admitting: *Deleted

## 2012-09-18 VITALS — BP 121/80 | HR 69 | Temp 98.1°F | Resp 20 | Ht 63.0 in | Wt 224.5 lb

## 2012-09-18 DIAGNOSIS — C50412 Malignant neoplasm of upper-outer quadrant of left female breast: Secondary | ICD-10-CM

## 2012-09-18 DIAGNOSIS — C50419 Malignant neoplasm of upper-outer quadrant of unspecified female breast: Secondary | ICD-10-CM

## 2012-09-18 DIAGNOSIS — H6690 Otitis media, unspecified, unspecified ear: Secondary | ICD-10-CM

## 2012-09-18 DIAGNOSIS — H6692 Otitis media, unspecified, left ear: Secondary | ICD-10-CM

## 2012-09-18 LAB — CBC WITH DIFFERENTIAL/PLATELET
BASO%: 0.4 % (ref 0.0–2.0)
Basophils Absolute: 0 10*3/uL (ref 0.0–0.1)
EOS%: 2.2 % (ref 0.0–7.0)
Eosinophils Absolute: 0.1 10*3/uL (ref 0.0–0.5)
HCT: 34.8 % (ref 34.8–46.6)
HGB: 11.6 g/dL (ref 11.6–15.9)
LYMPH%: 30.3 % (ref 14.0–49.7)
MCH: 28.2 pg (ref 25.1–34.0)
MCHC: 33.3 g/dL (ref 31.5–36.0)
MCV: 84.5 fL (ref 79.5–101.0)
MONO#: 0.2 10*3/uL (ref 0.1–0.9)
MONO%: 8.9 % (ref 0.0–14.0)
NEUT#: 1.6 10*3/uL (ref 1.5–6.5)
NEUT%: 58.2 % (ref 38.4–76.8)
Platelets: 165 10*3/uL (ref 145–400)
RBC: 4.12 10*6/uL (ref 3.70–5.45)
RDW: 13.7 % (ref 11.2–14.5)
WBC: 2.7 10*3/uL — ABNORMAL LOW (ref 3.9–10.3)
lymph#: 0.8 10*3/uL — ABNORMAL LOW (ref 0.9–3.3)
nRBC: 0 % (ref 0–0)

## 2012-09-18 LAB — COMPREHENSIVE METABOLIC PANEL (CC13)
ALT: 12 U/L (ref 0–55)
AST: 17 U/L (ref 5–34)
Albumin: 3.4 g/dL — ABNORMAL LOW (ref 3.5–5.0)
Alkaline Phosphatase: 70 U/L (ref 40–150)
BUN: 13.5 mg/dL (ref 7.0–26.0)
CO2: 27 mEq/L (ref 22–29)
Calcium: 9.6 mg/dL (ref 8.4–10.4)
Chloride: 101 mEq/L (ref 98–107)
Creatinine: 0.8 mg/dL (ref 0.6–1.1)
Glucose: 94 mg/dl (ref 70–99)
Potassium: 3.9 mEq/L (ref 3.5–5.1)
Sodium: 138 mEq/L (ref 136–145)
Total Bilirubin: 1.27 mg/dL — ABNORMAL HIGH (ref 0.20–1.20)
Total Protein: 6.9 g/dL (ref 6.4–8.3)

## 2012-09-18 MED ORDER — AZITHROMYCIN 250 MG PO TABS: ORAL_TABLET | ORAL | Status: DC

## 2012-09-18 MED ORDER — GABAPENTIN 300 MG PO CAPS: 300.0000 mg | ORAL_CAPSULE | Freq: Every day | ORAL | Status: DC

## 2012-09-18 NOTE — Patient Instructions (Addendum)
Please contact us at [phone] if you have any questions or concerns.  Please call us in 2 weeks and let us know how Gabapentin is working for you.   Take all medications as prescribed.  Wear lymphedema sleeve and glove, avoid salt,  and elevate your left arm when swollen.  Rest, hydrate, eat a balanced diet and try walking for exercise when you are able.

## 2012-09-18 NOTE — Progress Notes (Signed)
[facility]  Telephone:([phone] [phone]  OFFICE PROGRESS NOTE    ID: AKEISHA LAGERQUIST   DOB: 05-27-1961  MR#: [id number]  JYN#:[id number]  PCP: [name], MD GYN:  SU: Glenna Fellows, MD OTHER MD: Clarise Cruz,  Josephine Igo, MD   HISTORY OF PRESENT ILLNESS: From Dr. Renelda Loma note 06/13/2011:  "The patient first noticed a lump in the upper outer left breast about mid November. She had been having regular mammograms. She consulted her physician and was referred to the breast center for further workup. Bilateral mammogram was performed which revealed an irregular mass in the upper outer quadrant of the left breast at the 2:00 position measuring approximately 3 cm. A clearly enlarged left axillary lymph node was also seen. 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 Ki-67 was 100%. Subsequent bilateral breast MRI was performed. This reveals abnormal enhancement in the area of the mass measuring 2.9 x 5.2 cm. Again noted was an approximately 3 cm axillary lymph node. No other areas were detected."  Her subsequent history is as detailed below.  INTERVAL HISTORY: She returns for follow up of her breast cancer.   Of note, she had right hand carpal tunnel surgery on 07/16/2012.   REVIEW OF SYSTEMS: The review of systems continues to be  dominated by multiple areas of pain.  She is now being followed by Dr. Wynn Banker  for pain management.  She states that she was diagnosed with fibromyalgia since her last office visit.  Her hands and feet suffer from severe neuropathy.  She has ongoing complaints of fatigue, hot flashes and night sweats.  She states that she has had a headache for the past 2 days including today.  She also has complaints of having several loose stools for the past 2 days.  She denies any other  symptomatology.   PAST MEDICAL HISTORY: Past Medical History  Diagnosis Date  . Night sweats   . Fatigue   . Wears glasses   . SOB (shortness of breath) on exertion     walking and stairts  . Arthritis   . Hot flashes   . Blood transfusion   . Diverticulitis of colon   . Headache   . Anemia   . Breast cancer     T3N1 invasive ductal carcinoma left breast  . Bursitis   . Carpal tunnel syndrome   . Fibromyalgia 08/2012    PAST SURGICAL HISTORY: Past Surgical History  Procedure Laterality Date  . Cholecystectomy    . Appendectomy    . Carpal tunnel release      Bilateral  . Tonsillectomy    . Spur      Apex spur on both big toes  . Knee surgery      Left Knee  . Portacath placement  06/18/2011    Procedure: INSERTION PORT-A-CATH;  Surgeon: Mariella Saa, MD;  Location: [facility];  Service: General;  Laterality: Right;  right subclavian  . Abdominal hysterectomy      still has ovaries  . Axillary lymph node dissection  11/28/2011    Procedure: AXILLARY LYMPH NODE DISSECTION;  Surgeon: Mariella Saa, MD;  Location: [facility];  Service: General;  Laterality: Left;  . Breast surgery  2013  . Nasal sinus surgery      FAMILY HISTORY Family History  Problem Relation Age of Onset  .  Cancer Paternal Grandmother     unknown  . Hypertension Mother   . Diabetes Mother   . Hypertension Father   . Diabetes Father   Her father, 37 years old is the minister at [employer] she attends. Her mother is 70. The patient has 3 brothers and 3 sisters. There is no history of breast or ovarian cancer in the immediate family.  GYNECOLOGIC HISTORY: Menarche age 30, first live birth age 2, she is GX P3. She underwent hysterectomy without salpingo-oophorectomy in 2000. She never took hormone replacement.   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 She is currently applying for disability. She is divorced. At home she lives with her daughter [name] and her 3 children, as well as 2 children from her son Lounell Schumacher, who works as a Naval architect. All together there are 5 children in the house between the ages of 71 and 1. The patient has 4 additional grandchildren. She attends a [employer].    ADVANCED DIRECTIVES: Not in place  HEALTH MAINTENANCE: History  Substance Use Topics  . Smoking status: Never Smoker   . Smokeless tobacco: Never Used  . Alcohol Use: No     Colonoscopy: Not on file  PAP: Not on file   Bone density: Bone density scan on 04/09/2012 showed a T score of 1.2 (normal).  Lipid panel: Not on file  Allergies  Allergen Reactions  . Compazine Other (See Comments)    Numbness of face and lips  . Aleve (Naproxen) Nausea Only  . Oxycodone     hallucinations  . Penicillins Nausea Only    Current Outpatient Prescriptions  Medication Sig Dispense Refill  . acetaminophen (TYLENOL) 500 MG tablet Take 500 mg by mouth as needed for pain.      Marland Kitchen ALPRAZolam (XANAX) 0.5 MG tablet Take 0.5 mg by mouth 2 (two) times daily as needed for sleep.      Marland Kitchen anastrozole (ARIMIDEX) 1 MG tablet Take 1 tablet (1 mg total) by mouth daily.  30 tablet  6  . Cholecalciferol (VITAMIN D) 2000 UNITS CAPS Take 1 capsule by mouth daily.      Marland Kitchen oxyCODONE-acetaminophen (PERCOCET) 5-325 MG per tablet Take 1-2 tablets by mouth every 4 (four) hours as needed for pain.  10 tablet  0  . pregabalin (LYRICA) 150 MG capsule Take 1 capsule (150 mg total) by mouth 2 (two) times daily.  60 capsule  1  . traMADol (ULTRAM) 50 MG tablet TAKE 1 TABLET BY MOUTH EVERY 6 HOURS AS NEEDED FOR PAIN  90 tablet  0  . azithromycin (ZITHROMAX Z-PAK) 250 MG tablet Take 2 tabs (500 mg) today and 1 tab (250 mg) days 2 through 5  6 each  0  . gabapentin (NEURONTIN) 300 MG capsule Take 1 capsule (300 mg total) by mouth at bedtime.  30 capsule  12   No current facility-administered medications for this visit.    OBJECTIVE: Middle-aged African American  woman Filed Vitals:   09/18/12 0909  BP: 121/80  Pulse: 69  Temp: 98.1 F (36.7 C)  Resp: 20     Body mass index is 39.78 kg/(m^2).    ECOG FS: 2  General appearance: Alert, cooperative, well nourished, no apparent distress Head: Normocephalic, without obvious abnormality, atraumatic Eyes: Conjunctivae/corneas clear, PERRLA, EOMI Ears: Left ear was mildly inflamed by tympanic membrane and mildly cloudy in appearance Nose: Nares, septum and mucosa are normal, no drainage or sinus tenderness. Neck: No  adenopathy, supple, symmetrical, trachea midline, thyroid not enlarged, no tenderness Resp: Clear to auscultation bilaterally Cardio: Regular rate and rhythm, S1, S2 normal, no murmur, click, rub or gallop Breasts:  Pendulous bilaterally, left breast radiation changes noted, left breast and axillary area have well-healed surgical scars, no nipple inversion, and no lymphadenopathy, no axillary fullness GI: Soft, distended, non-tender, hypoactive bowel sounds, excessive habitus Extremities: Extremities normal, atraumatic, no cyanosis, left upper extremity lymphedema, and the patient ambulates with a cane Lymph nodes: Cervical, supraclavicular, and axillary nodes normal Neurologic: Grossly normal   LAB RESULTS: Lab Results  Component Value Date   WBC 2.7* 09/18/2012   NEUTROABS 1.6 09/18/2012   HGB 11.6 09/18/2012   HCT 34.8 09/18/2012   MCV 84.5 09/18/2012   PLT 165 09/18/2012      Chemistry      Component Value Date/Time   NA 138 09/18/2012 0853   NA 135 02/11/2012 1450   K 3.9 09/18/2012 0853   K 3.9 02/11/2012 1450   CL 101 09/18/2012 0853   CL 101 02/11/2012 1450   CO2 27 09/18/2012 0853   CO2 26 02/11/2012 1450   BUN 13.5 09/18/2012 0853   BUN 13 02/11/2012 1450   CREATININE 0.8 09/18/2012 0853   CREATININE 0.53 02/11/2012 1450      Component Value Date/Time   CALCIUM 9.6 09/18/2012 0853   CALCIUM 9.0 02/11/2012 1450   ALKPHOS 70 09/18/2012 0853   ALKPHOS 60 11/08/2011 1138   AST 17  09/18/2012 0853   AST 17 11/08/2011 1138   ALT 12 09/18/2012 0853   ALT 9 11/08/2011 1138   BILITOT 1.27* 09/18/2012 0853   BILITOT 1.1 11/08/2011 1138      Lab Results  Component Value Date   LABCA2 41* 06/13/2011    Urinalysis    Component Value Date/Time   COLORURINE YELLOW 02/11/2012 1343   APPEARANCEUR CLEAR 02/11/2012 1343   LABSPEC 1.010 02/11/2012 1343   PHURINE 6.5 02/11/2012 1343   GLUCOSEU NEGATIVE 02/11/2012 1343   HGBUR NEGATIVE 02/11/2012 1343   BILIRUBINUR NEGATIVE 02/11/2012 1343   KETONESUR NEGATIVE 02/11/2012 1343   PROTEINUR NEGATIVE 02/11/2012 1343   UROBILINOGEN 0.2 02/11/2012 1343   NITRITE NEGATIVE 02/11/2012 1343   LEUKOCYTESUR NEGATIVE 02/11/2012 1343    STUDIES: No results found.  ASSESSMENT: 52 y.o. [place], [place] woman:  (1) Status post left breast biopsy 06/01/2011 for a cT2 pN1 invasive ductal carcinoma, grade 3, estrogen receptor 80% and progesterone receptor 9% positive, with no HER-2 amplification and an MIB-1 of 100%  (2) Treated neoadjuvantly with 4 cycles of cyclophosphamide, epirubicin and fluorouracil, followed by 4 cycles of docetaxel, completed 11/01/2011  (3) Status post left lumpectomy and axillary lymph node resection 11/28/2011, showing a complete pathologic response (no residual invasive or in situ cancer in the breast and 0 of 17 lymph nodes involved)  (4) Adjuvant radiation therapy completed 03/31/2012  (5) Started Anastrozole November 2013; normal bone density scan 04/09/2012  (6) Hot flashes/night sweats/neuropathic pain  (7) Left ear otitis media  (8)  Left upper extremity lymphedema  PLAN: She is tolerating the Anastrozole moderately well, although it might be difficult to tell since she has chronic problems with pain at multiple sites.  Her next bilateral digital diagnostic mammogram is due in 05/2013.  Dr. Darnelle Catalan has previously stated that her prognosis is good, since she had a complete pathologic response. The plan are to continue  Anastrozole for a total of 5 years (until 04/2017).  Dr. Wynn Banker is  following her for chronic pain.  The plan is for her to see Dr. Johna Sheriff in 12/2012, and she will see [place] again in 03/2013.  We will continue to follow her on an alternating every 3 month basis until she reaches 2 years from her definitive surgery.  We will check a CBC, CMP, LDH, and vitamin D level during her next office visit.  An electronic prescription for Gabapentin 300 mg by mouth each bedtime, #30 with 12 refills was sent to the patient's pharmacy for her hot flashes/night sweats/neuropathic pain.  The patient was asked to contact us in 2 weeks and let us know how Gabapentin is working for her.  An electronic prescription for a five-day Z-Pak for otitis media was also sent to the patient's pharmacy.  The patient was encouraged to wear her lymphedema sleeve and glove as much as possible for her left upper extremity edema in addition to elevating the extremity.  The patient was encouraged to contact us in the interim with any questions or concerns.    Larina Bras, NP-C    09/19/2012   1:34 PM

## 2012-09-18 NOTE — Telephone Encounter (Signed)
appts made and printed...td 

## 2012-09-26 ENCOUNTER — Encounter: Attending: Physical Medicine & Rehabilitation | Admitting: Physical Medicine and Rehabilitation

## 2012-09-26 DIAGNOSIS — IMO0001 Reserved for inherently not codable concepts without codable children: Secondary | ICD-10-CM | POA: Insufficient documentation

## 2012-09-26 DIAGNOSIS — G622 Polyneuropathy due to other toxic agents: Secondary | ICD-10-CM | POA: Insufficient documentation

## 2012-09-26 DIAGNOSIS — C50419 Malignant neoplasm of upper-outer quadrant of unspecified female breast: Secondary | ICD-10-CM | POA: Insufficient documentation

## 2012-09-26 DIAGNOSIS — M25519 Pain in unspecified shoulder: Secondary | ICD-10-CM | POA: Insufficient documentation

## 2012-09-30 ENCOUNTER — Encounter (HOSPITAL_BASED_OUTPATIENT_CLINIC_OR_DEPARTMENT_OTHER): Admitting: Physical Medicine and Rehabilitation

## 2012-09-30 ENCOUNTER — Encounter: Payer: Self-pay | Admitting: Physical Medicine and Rehabilitation

## 2012-09-30 VITALS — BP 130/73 | HR 71 | Ht 63.0 in | Wt 225.0 lb

## 2012-09-30 DIAGNOSIS — IMO0002 Reserved for concepts with insufficient information to code with codable children: Secondary | ICD-10-CM

## 2012-09-30 DIAGNOSIS — C50412 Malignant neoplasm of upper-outer quadrant of left female breast: Secondary | ICD-10-CM

## 2012-09-30 DIAGNOSIS — M25519 Pain in unspecified shoulder: Secondary | ICD-10-CM

## 2012-09-30 DIAGNOSIS — C50419 Malignant neoplasm of upper-outer quadrant of unspecified female breast: Secondary | ICD-10-CM

## 2012-09-30 DIAGNOSIS — IMO0001 Reserved for inherently not codable concepts without codable children: Secondary | ICD-10-CM

## 2012-09-30 DIAGNOSIS — G622 Polyneuropathy due to other toxic agents: Secondary | ICD-10-CM

## 2012-09-30 DIAGNOSIS — M25512 Pain in left shoulder: Secondary | ICD-10-CM

## 2012-09-30 MED ORDER — PREGABALIN 150 MG PO CAPS: 150.0000 mg | ORAL_CAPSULE | Freq: Two times a day (BID) | ORAL | Status: DC

## 2012-09-30 NOTE — Progress Notes (Signed)
Subjective:    Patient ID: Kelsey Wilson, female    DOB: 19-Feb-1961, 52 y.o.   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 Postoperative radiation therapy, pre-operative chemotherapy. Currently on Arimidex  Pain around site of port removal right chest  Aching all over body, spasms in legs. Patient states that she has had this even before her cancer diagnosis. Worsening since cancer diagnosis. Cannot definitely tell whether or not it started right after starting on anastrozole. Patient could not get the higher Lyrica dose, because the pharmacy never got the prescription, she continued on her former dose of 75mg  tid, which she said is not sufficient. She also reports that her oncologist added Gabapentin 300mg   To her meds, which she does not tolerate well.  Pain Inventory Average Pain 10 Pain Right Now 10 My pain is tingling and aching  In the last 24 hours, has pain interfered with the following? General activity 10 Relation with others 7 Enjoyment of life 7 What TIME of day is your pain at its worst? night Sleep (in general) Poor  Pain is worse with: walking, bending, sitting and standing Pain improves with: rest Relief from Meds: 0  Mobility walk with assistance use a cane ability to climb steps?  no do you drive?  yes  Function not employed: date last employed 1/12 I need assistance with the following:  meal prep, household duties and shopping  Neuro/Psych weakness numbness tingling trouble walking spasms anxiety  Prior Studies Any changes since last visit?  no  Physicians involved in your care Dr Tonny Bollman   Family History  Problem Relation Age of Onset  . Cancer Paternal Grandmother     unknown  . Hypertension Mother   . Diabetes Mother   . Hypertension Father   . Diabetes Father    History   Social History  . Marital Status:  Single    Spouse Name: N/A    Number of Children: N/A  . Years of Education: N/A   Social History Main Topics  . Smoking status: Never Smoker   . Smokeless tobacco: Never Used  . Alcohol Use: No  . Drug Use: No  . Sexually Active: No   Other Topics Concern  . None   Social History Narrative  . None   Past Surgical History  Procedure Laterality Date  . Cholecystectomy    . Appendectomy    . Carpal tunnel release      Bilateral  . Tonsillectomy    . Spur      Apex spur on both big toes  . Knee surgery      Left Knee  . Portacath placement  06/18/2011    Procedure: INSERTION PORT-A-CATH;  Surgeon: Mariella Saa, MD;  Location:  [facility];  Service: General;  Laterality: Right;  right subclavian  . Abdominal hysterectomy      still has ovaries  . Axillary lymph node dissection  11/28/2011    Procedure: AXILLARY LYMPH NODE DISSECTION;  Surgeon: Mariella Saa, MD;  Location: [facility];  Service: General;  Laterality: Left;  . Breast surgery  2013  . Nasal sinus surgery     Past Medical History  Diagnosis Date  . Night sweats   . Fatigue   . Wears glasses   . SOB (shortness of breath) on exertion     walking and stairts  . Arthritis   .  Hot flashes   . Blood transfusion   . Diverticulitis of colon   . Headache   . Anemia   . Breast cancer     T3N1 invasive ductal carcinoma left breast  . Bursitis   . Carpal tunnel syndrome   . Fibromyalgia 08/2012   BP 130/73  Pulse 71  Ht 5\' 3"  (1.6 m)  Wt 225 lb (102.059 kg)  BMI 39.87 kg/m2  SpO2 99%      Review of Systems  Cardiovascular: Positive for leg swelling.  Gastrointestinal: Positive for constipation.  Musculoskeletal: Positive for gait problem.  Neurological: Positive for weakness and numbness.  Psychiatric/Behavioral: The patient is nervous/anxious.   All other systems reviewed and are negative.       Objective:   Physical Exam Constitutional: She is oriented to person,  place, and time. She appears well-developed.  Obese  HENT:  Head: Normocephalic and atraumatic.  Right Ear: External ear normal.  Left Ear: External ear normal.  Eyes: Conjunctivae and EOM are normal. Pupils are equal, round, and reactive to light.  Neck: Normal range of motion.  Musculoskeletal:  Right hip: She exhibits decreased range of motion.  Left hip: She exhibits decreased range of motion.  Cervical back: Normal.  Lumbar back: She exhibits decreased range of motion.  Left shoulder restricted into abduction with external rotation. Negative straight leg raising test bilateral  Neurological: She is alert and oriented to person, place, and time. She has normal reflexes.  Psychiatric: She has a normal mood and affect.  Tenderness 18/18 fibromyalgia tender points   Sensation is hyperesthetic to pinprick bilateral hands and feet  She has decreased sensation at bilateral feet below the ankles  Reduced sensation to pinprick in the fingers, Forearms and arms  Motor strength is 4+/5 in bilateral deltoid, biceps, triceps, grip, hip flexor, knee extensors, ankle dorsiflexor and plantar flexor, mainly restricted by pain, patient does not put full effort in, when tested at some tests.  Skin shows a healing incision over the right volar wrist  Mood and affect are normal. Symmetric normal motor tone is noted throughout. Normal muscle bulk.   Fine motor movements are normal in both hands.  DTR in the upper and lower extremity are present and symmetric 1+. No clonus is noted.  Patient arises from chair with difficulty. Wide based gait with a cane.  Unable to walk on heels and toes  Tandem walk is not possible without help . No pronator drift. Rhomberg negative.          Assessment & Plan:  1. Diffuse body pain which predated her breast cancer diagnosis. This patient has probable fibromyalgia syndrome,  Increased Lyrica today to 150mg ,  2 times a day, pharmacy did not receive prescription  after last visit..We may need to titrate upward to TID next visit if not much better. Usual effective dose for fibromyalgia is 450 mg per day with Max dose of 600 mg per day. Ordered aquatic PT which will be beneficial to most of her symptoms. Also demonstrated exercises and stretches for her left shoulder, which is slightly restricted into abduction with external rotation.  Recommended magnesium for her muscle cramps/pain, and Vit. B12 and B6 to support her nerve recovery. Her symptoms may be amplified by the anostrazole  2. 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 Follow up in 1 month

## 2012-09-30 NOTE — Patient Instructions (Signed)
You could try magnesium for your muscle cramps/ pain, you can also try Vit. B12 and B6 for your nerve symptoms. Try to look into a water exercise program.

## 2012-10-07 ENCOUNTER — Other Ambulatory Visit: Payer: Self-pay | Admitting: Oncology

## 2012-10-30 ENCOUNTER — Encounter: Payer: Self-pay | Admitting: Physical Medicine and Rehabilitation

## 2012-10-30 ENCOUNTER — Encounter: Attending: Physical Medicine and Rehabilitation | Admitting: Physical Medicine and Rehabilitation

## 2012-10-30 VITALS — BP 141/85 | HR 82 | Resp 16 | Ht 63.0 in | Wt 225.0 lb

## 2012-10-30 DIAGNOSIS — C50919 Malignant neoplasm of unspecified site of unspecified female breast: Secondary | ICD-10-CM | POA: Insufficient documentation

## 2012-10-30 DIAGNOSIS — Z923 Personal history of irradiation: Secondary | ICD-10-CM | POA: Insufficient documentation

## 2012-10-30 DIAGNOSIS — C50419 Malignant neoplasm of upper-outer quadrant of unspecified female breast: Secondary | ICD-10-CM

## 2012-10-30 DIAGNOSIS — M62838 Other muscle spasm: Secondary | ICD-10-CM | POA: Insufficient documentation

## 2012-10-30 DIAGNOSIS — Z9221 Personal history of antineoplastic chemotherapy: Secondary | ICD-10-CM | POA: Insufficient documentation

## 2012-10-30 DIAGNOSIS — R079 Chest pain, unspecified: Secondary | ICD-10-CM | POA: Insufficient documentation

## 2012-10-30 DIAGNOSIS — M545 Low back pain, unspecified: Secondary | ICD-10-CM

## 2012-10-30 DIAGNOSIS — G609 Hereditary and idiopathic neuropathy, unspecified: Secondary | ICD-10-CM | POA: Insufficient documentation

## 2012-10-30 DIAGNOSIS — R52 Pain, unspecified: Secondary | ICD-10-CM | POA: Insufficient documentation

## 2012-10-30 DIAGNOSIS — C50412 Malignant neoplasm of upper-outer quadrant of left female breast: Secondary | ICD-10-CM

## 2012-10-30 MED ORDER — PREGABALIN 150 MG PO CAPS: 150.0000 mg | ORAL_CAPSULE | Freq: Three times a day (TID) | ORAL | Status: DC

## 2012-10-30 MED ORDER — DICLOFENAC SODIUM 1 % TD GEL: 2.0000 g | Freq: Four times a day (QID) | TRANSDERMAL | Status: DC

## 2012-10-30 NOTE — Progress Notes (Signed)
Subjective:    Patient ID: Kelsey Wilson, female    DOB: 05-18-1961, 52 y.o.   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 Postoperative radiation therapy, pre-operative chemotherapy. Currently on Arimidex  Pain around site of port removal right chest  Aching all over body, spasms in legs. Patient states that she has had this even before her cancer diagnosis. Worsening since cancer diagnosis. Cannot definitely tell whether or not it started right after starting on anastrozole.  Patient could not get the higher Lyrica dose, because the pharmacy never got the prescription, she continued on her former dose of 75mg  tid, which she said is not sufficient.  She also reports that her oncologist added Gabapentin 300mg  To her meds, which she does not tolerate well. The patient reports that she has started the aquatic PT, which she enjoys and which gives her some relief. She also reports that she is seeing a counselor now, which really is helping her.  Pain Inventory Average Pain 10 Pain Right Now 10 My pain is aching  In the last 24 hours, has pain interfered with the following? General activity 7 Relation with others 7 Enjoyment of life 7 What TIME of day is your pain at its worst?  na Sleep (in general) NA  Pain is worse with: walking, bending, sitting and standing Pain improves with: na Relief from Meds: 3  Mobility use a cane ability to climb steps?  yes do you drive?  yes  Function I need assistance with the following:  meal prep, household duties and shopping  Neuro/Psych weakness numbness tremor trouble walking depression anxiety  Prior Studies Any changes since last visit?  no  Physicians involved in your care Any changes since last visit?  no   Family History  Problem Relation Age of Onset  . Cancer Paternal Grandmother     unknown  .  Hypertension Mother   . Diabetes Mother   . Hypertension Father   . Diabetes Father    History   Social History  . Marital Status: Single    Spouse Name: N/A    Number of Children: N/A  . Years of Education: N/A   Social History Main Topics  . Smoking status: Never Smoker   . Smokeless tobacco: Never Used  . Alcohol Use: No  . Drug Use: No  . Sexually Active: No   Other Topics Concern  . None   Social History Narrative  . None   Past Surgical History  Procedure Laterality Date  . Cholecystectomy    . Appendectomy    . Carpal tunnel release      Bilateral  . Tonsillectomy    . Spur      Apex spur on both big toes  . Knee surgery      Left Knee  . Portacath placement  06/18/2011    Procedure: INSERTION PORT-A-CATH;  Surgeon: Mariella Saa, MD;  Location: [facility];  Service: General;  Laterality: Right;  right subclavian  . Abdominal hysterectomy      still has ovaries  . Axillary lymph node dissection  11/28/2011    Procedure: AXILLARY LYMPH NODE DISSECTION;  Surgeon: Mariella Saa, MD;  Location: [facility];  Service: General;  Laterality: Left;  . Breast surgery  2013  . Nasal sinus surgery     Past Medical History  Diagnosis Date  . Night sweats   .  Fatigue   . Wears glasses   . SOB (shortness of breath) on exertion     walking and stairts  . Arthritis   . Hot flashes   . Blood transfusion   . Diverticulitis of colon   . Headache(784.0)   . Anemia   . Breast cancer     T3N1 invasive ductal carcinoma left breast  . Bursitis   . Carpal tunnel syndrome   . Fibromyalgia 08/2012   BP 141/85  Pulse 82  Resp 16  Ht 5\' 3"  (1.6 m)  Wt 225 lb (102.059 kg)  BMI 39.87 kg/m2  SpO2 94%     Review of Systems  Neurological: Positive for tremors, weakness and numbness.  Psychiatric/Behavioral: Positive for dysphoric mood and agitation.  All other systems reviewed and are negative.       Objective:   Physical  Exam Constitutional: She is oriented to person, place, and time. She appears well-developed.  Obese  HENT:  Head: Normocephalic and atraumatic.  Right Ear: External ear normal.  Left Ear: External ear normal.  Eyes: Conjunctivae and EOM are normal. Pupils are equal, round, and reactive to light.  Neck: Normal range of motion.  Musculoskeletal:  Right hip: She exhibits decreased range of motion.  Left hip: She exhibits decreased range of motion.  Cervical back: Normal.  Lumbar back: She exhibits decreased range of motion.  Left shoulder restricted into abduction with external rotation. Negative straight leg raising test bilateral  Neurological: She is alert and oriented to person, place, and time. She has normal reflexes.  Psychiatric: She has a normal mood and affect.  Tenderness 18/18 fibromyalgia tender points  Sensation is hyperesthetic to pinprick bilateral hands and feet  She has decreased sensation at bilateral feet below the ankles  Reduced sensation to pinprick in the fingers, Forearms and arms  Motor strength is 4+/5 in bilateral deltoid, biceps, triceps, grip, hip flexor, knee extensors, ankle dorsiflexor and plantar flexor, mainly restricted by pain, patient does not put full effort in, when tested at some tests.  Skin shows a healing incision over the right volar wrist  Mood and affect are normal.  Symmetric normal motor tone is noted throughout. Normal muscle bulk. Fine motor movements are normal in both hands.  DTR in the upper and lower extremity are present and symmetric 1+. No clonus is noted.  Patient arises from chair with difficulty. Wide based gait with a cane. Unable to walk on heels and toes Tandem walk is not possible without help . No pronator drift. Rhomberg negative.        Assessment & Plan:  1. Diffuse body pain which predated her breast cancer diagnosis. This patient has probable fibromyalgia syndrome, Increased Lyrica today to 150mg , 2 times a day,  pharmacy did not receive prescription after last visit..We may need to titrate upward to TID next visit if not much better. Usual effective dose for fibromyalgia is 450 mg per day with Max dose of 600 mg per day.  Ordered aquatic PT which will be beneficial to most of her symptoms, which she has started and which is giving her some relief. Also demonstrated exercises and stretches for her left shoulder, which is slightly restricted into abduction with external rotation.  Recommended magnesium for her muscle cramps/pain, and Vit. B12 and B6 to support her nerve recovery.  Her symptoms may be amplified by the anostrazole  2. 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  Patient should continue with seeing a counselor to help with coping  strategies. Follow up in 2 month

## 2012-10-30 NOTE — Patient Instructions (Addendum)
Continue with the aquatic PT. Try to find out whether your insurance supports the [employer] program. Continue with seeing your counselor

## 2012-11-03 ENCOUNTER — Ambulatory Visit (HOSPITAL_COMMUNITY)
Admission: RE | Admit: 2012-11-03 | Discharge: 2012-11-03 | Disposition: A | Discharge status: Home / Self Care | Source: Ambulatory Visit | Attending: Physical Medicine and Rehabilitation | Admitting: Physical Medicine and Rehabilitation

## 2012-11-03 DIAGNOSIS — M545 Low back pain, unspecified: Secondary | ICD-10-CM | POA: Insufficient documentation

## 2012-11-03 DIAGNOSIS — M47817 Spondylosis without myelopathy or radiculopathy, lumbosacral region: Secondary | ICD-10-CM | POA: Insufficient documentation

## 2012-11-03 IMAGING — CR DG LUMBAR SPINE COMPLETE 4+V
6 series · 6 of 6 positions shown · non-contrast
Comparison: January 30, 2006.

CLINICAL DATA: Low back pain

LUMBAR SPINE - COMPLETE 4+ VIEW

[t l-spine a.p.]
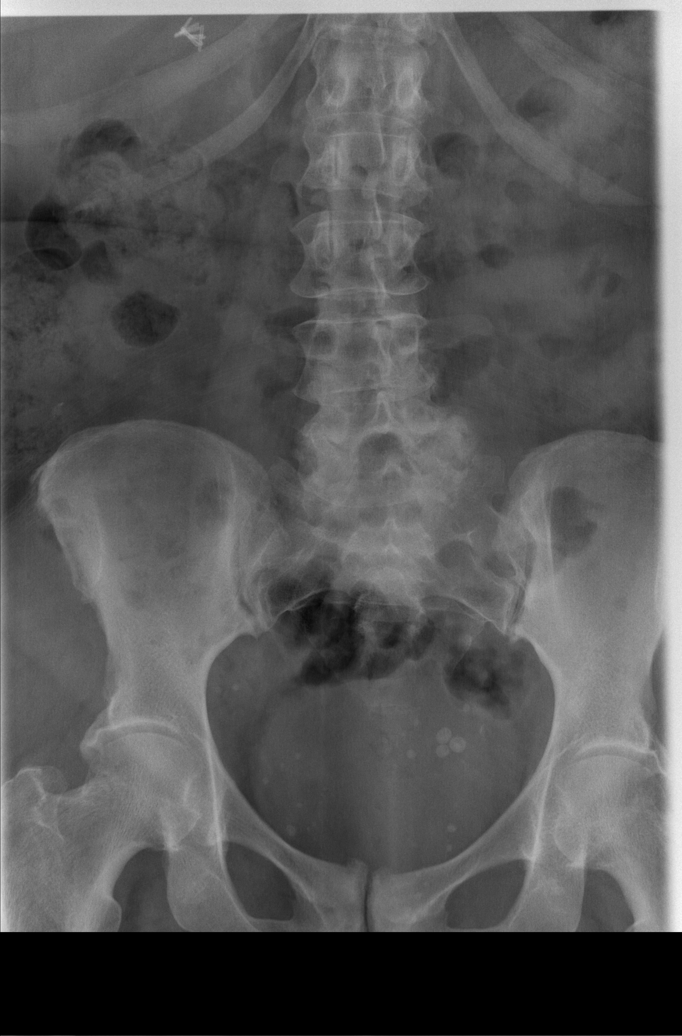

[t l-spine oblique exposure]
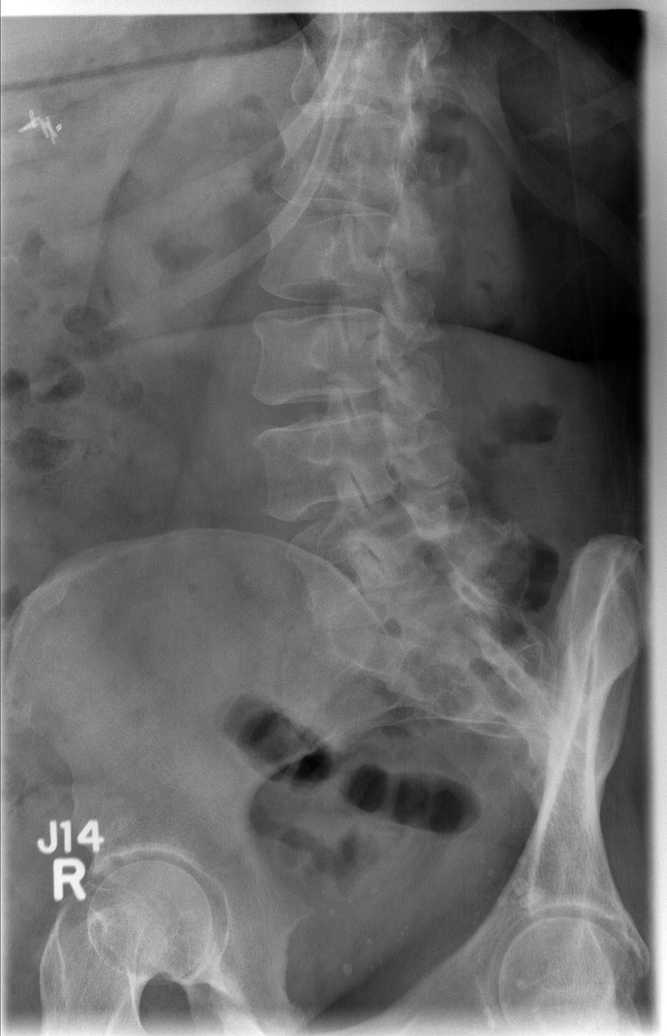

[t l-spine oblique exposure * (1 of 2)]
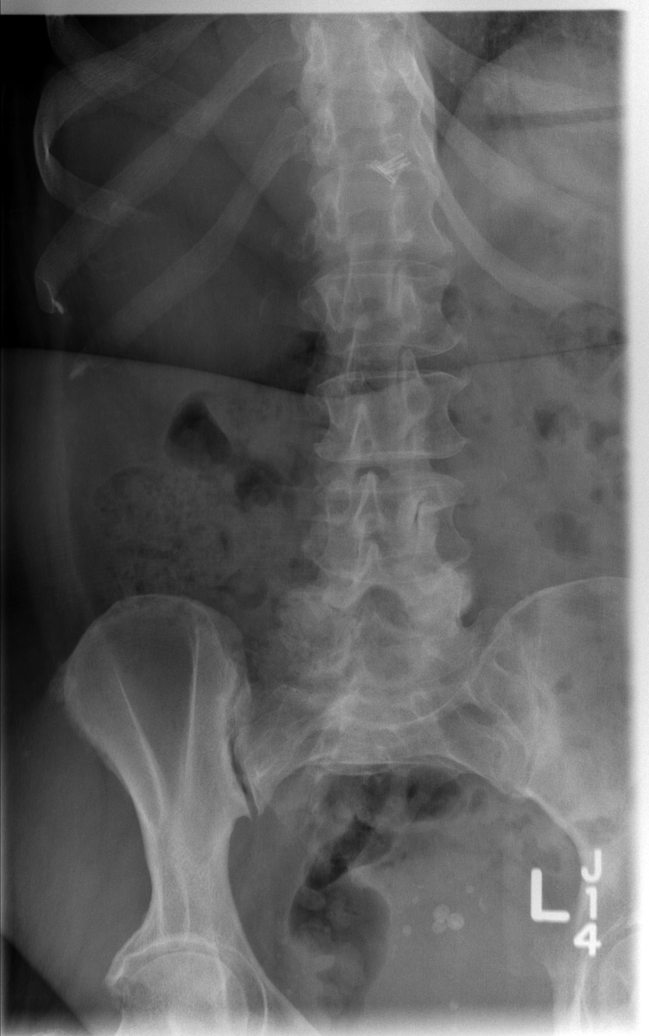

[t l-spine oblique exposure * (2 of 2)]
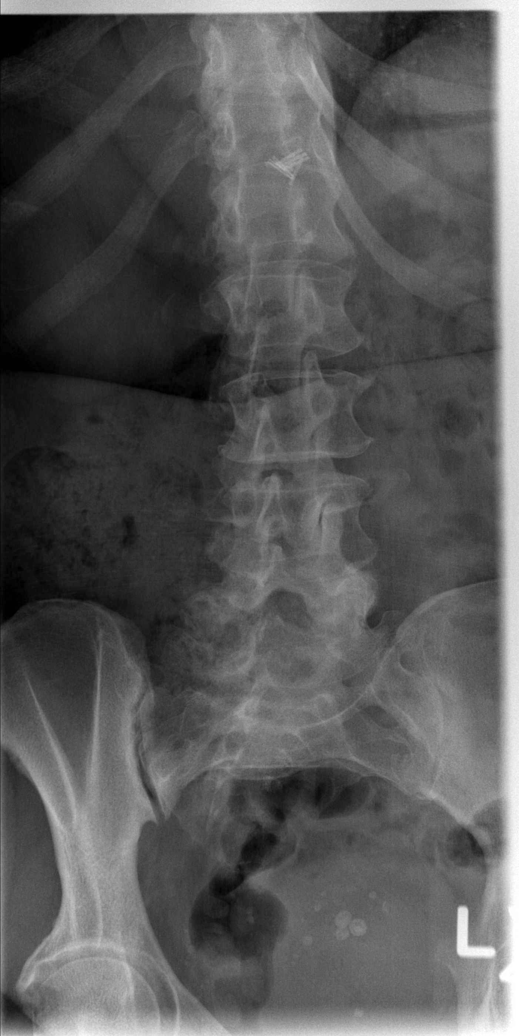

[t l-spine lat]
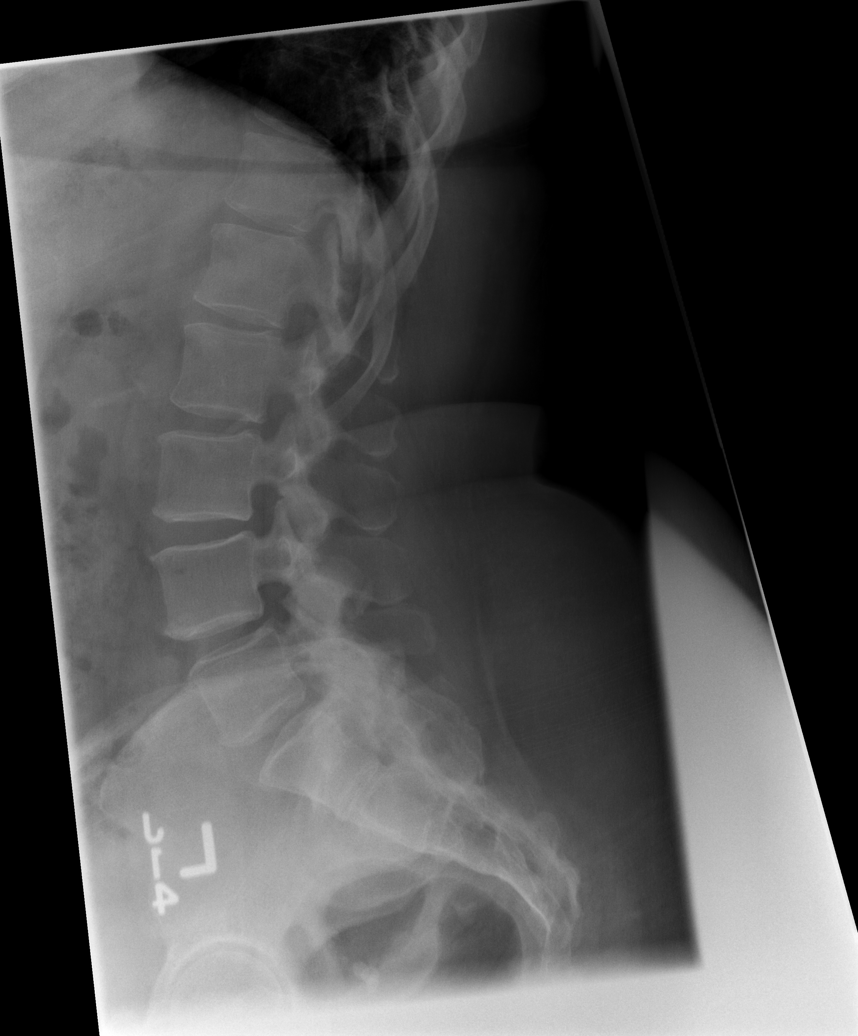

[t l-spine l5-s1 spot]
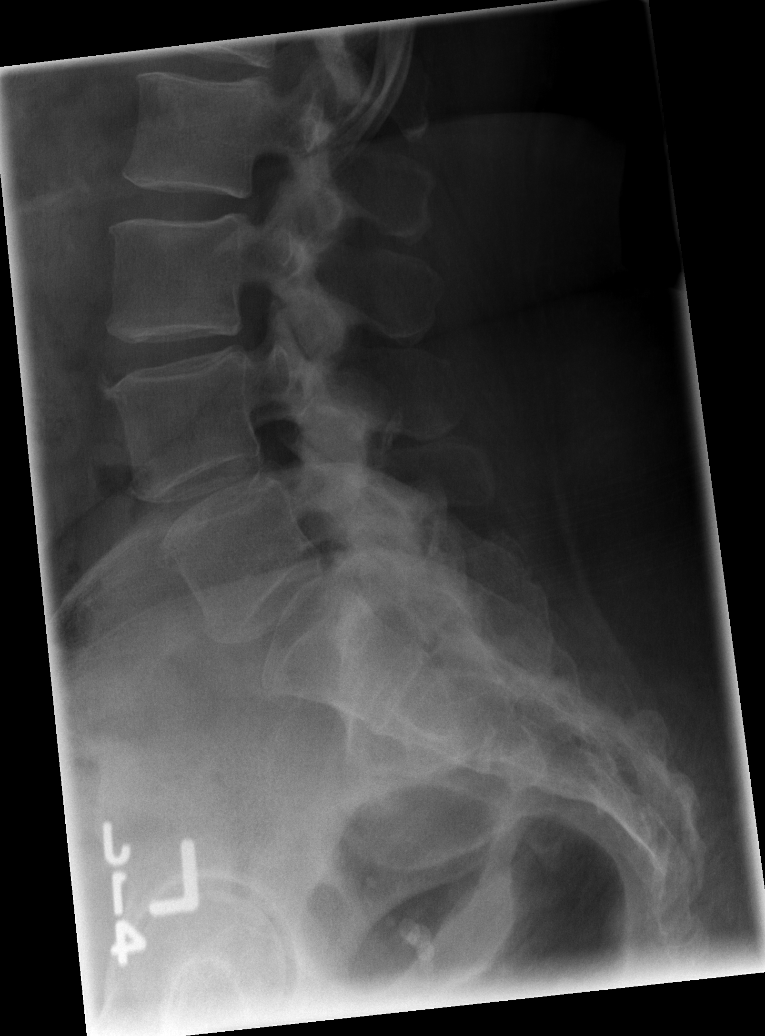

[6 of 6 positions shown; findings below may reference images not displayed]

FINDINGS: No fracture or spondylolisthesis is noted.  Disc spaces
appear to be well maintained.  Hypertrophy of posterior facet
joints is noted at L4-5 and L5 S1 secondary to degenerative joint
disease.  Phleboliths are noted in the pelvis.  Minimal
dextroscoliosis of lumbar spine is noted.
IMPRESSION: Degenerative changes involving posterior facet joints of lower
lumbar spine.  No other significant abnormality seen in lumbar
spine.

## 2012-11-04 ENCOUNTER — Other Ambulatory Visit: Payer: Self-pay | Admitting: *Deleted

## 2012-11-04 DIAGNOSIS — C50412 Malignant neoplasm of upper-outer quadrant of left female breast: Secondary | ICD-10-CM

## 2012-11-04 MED ORDER — ANASTROZOLE 1 MG PO TABS: 1.0000 mg | ORAL_TABLET | Freq: Every day | ORAL | Status: DC

## 2012-11-05 ENCOUNTER — Telehealth: Payer: Self-pay

## 2012-11-05 NOTE — Telephone Encounter (Signed)
Patient informed of xray results.  She request an appointment with Clydie Braun to discuss results.

## 2012-11-05 NOTE — Telephone Encounter (Signed)
Left message for patient to call office regarding xray results. 

## 2012-11-05 NOTE — Telephone Encounter (Signed)
Message copied by Judd Gaudier on Wed Nov 05, 2012 10:32 AM ------      Message from: Su Monks      Created: Mon Nov 03, 2012 11:51 AM       Please call patient, x-rays of L-spine showed :      No fracture or spondylolisthesis is noted.  Disc spaces      appear to be well maintained.  Hypertrophy of posterior facet      joints is noted at L4-5 and L5 S1 secondary to degenerative joint      disease.   Minimaldextroscoliosis of lumbar spine is noted.      Will discuss further at next visit. ------

## 2012-11-07 ENCOUNTER — Encounter: Attending: Physical Medicine and Rehabilitation | Admitting: Physical Medicine and Rehabilitation

## 2012-11-07 ENCOUNTER — Encounter: Payer: Self-pay | Admitting: Physical Medicine and Rehabilitation

## 2012-11-07 VITALS — BP 111/63 | HR 70 | Resp 16 | Ht 63.0 in | Wt 228.0 lb

## 2012-11-07 DIAGNOSIS — Z923 Personal history of irradiation: Secondary | ICD-10-CM | POA: Insufficient documentation

## 2012-11-07 DIAGNOSIS — C50919 Malignant neoplasm of unspecified site of unspecified female breast: Secondary | ICD-10-CM | POA: Insufficient documentation

## 2012-11-07 DIAGNOSIS — M47817 Spondylosis without myelopathy or radiculopathy, lumbosacral region: Secondary | ICD-10-CM | POA: Insufficient documentation

## 2012-11-07 DIAGNOSIS — R0789 Other chest pain: Secondary | ICD-10-CM | POA: Insufficient documentation

## 2012-11-07 DIAGNOSIS — Z79899 Other long term (current) drug therapy: Secondary | ICD-10-CM | POA: Insufficient documentation

## 2012-11-07 DIAGNOSIS — R52 Pain, unspecified: Secondary | ICD-10-CM | POA: Insufficient documentation

## 2012-11-07 DIAGNOSIS — G609 Hereditary and idiopathic neuropathy, unspecified: Secondary | ICD-10-CM | POA: Insufficient documentation

## 2012-11-07 MED ORDER — MELOXICAM 15 MG PO TABS: 15.0000 mg | ORAL_TABLET | Freq: Every day | ORAL | Status: DC

## 2012-11-07 NOTE — Progress Notes (Signed)
Subjective:    Patient ID: Kelsey Wilson, female    DOB: [birth year], 52 y.o.   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 Postoperative radiation therapy, pre-operative chemotherapy. Currently on Arimidex  Pain around site of port removal right chest  Aching all over body, spasms in legs. Patient states that she has had this even before her cancer diagnosis. Worsening since cancer diagnosis. Cannot definitely tell whether or not it started right after starting on anastrozole.  Patient could not get the higher Lyrica dose, because the pharmacy never got the prescription, she continued on her former dose of 75mg  tid, which she said is not sufficient.  She also reports that her oncologist added Gabapentin 300mg  To her meds, which she does not tolerate well.  The patient reports that she has started the aquatic PT, which she enjoys and which gives her some relief. She also reports that she is seeing a counselor now, which really is helping her. Patient is here today to discuss her L-spine X-rays.  Pain Inventory  Average Pain 10  Pain Right Now 10  My pain is aching  In the last 24 hours, has pain interfered with the following?  General activity 7  Relation with others 7  Enjoyment of life 7  What TIME of day is your pain at its worst? na  Sleep (in general) NA  Pain is worse with: walking, bending, sitting and standing  Pain improves with: na  Relief from Meds: 3  Mobility  use a cane  ability to climb steps? yes  do you drive? yes  Function  I need assistance with the following: meal prep, household duties and shopping  Neuro/Psych  weakness  numbness  tremor  trouble walking  depression  anxiety  Prior Studies  Any changes since last visit? no  Physicians involved in your care  Any changes since last visit? no   Family History  Problem Relation Age  of Onset  . Cancer Paternal Grandmother     unknown  . Hypertension Mother   . Diabetes Mother   . Hypertension Father   . Diabetes Father    History   Social History  . Marital Status: Single    Spouse Name: N/A    Number of Children: N/A  . Years of Education: N/A   Social History Main Topics  . Smoking status: Never Smoker   . Smokeless tobacco: Never Used  . Alcohol Use: No  . Drug Use: No  . Sexually Active: No   Other Topics Concern  . None   Social History Narrative  . None   Past Surgical History  Procedure Laterality Date  . Cholecystectomy    . Appendectomy    . Carpal tunnel release      Bilateral  . Tonsillectomy    . Spur      Apex spur on both big toes  . Knee surgery      Left Knee  . Portacath placement  06/18/2011    Procedure: INSERTION PORT-A-CATH;  Surgeon: Mariella Saa, MD;  Location: [facility];  Service: General;  Laterality: Right;  right subclavian  . Abdominal hysterectomy      still has ovaries  . Axillary lymph node dissection  11/28/2011    Procedure: AXILLARY LYMPH NODE DISSECTION;  Surgeon: Mariella Saa, MD;  Location: [facility];  Service: General;  Laterality:  Left;  . Breast surgery  2013  . Nasal sinus surgery     Past Medical History  Diagnosis Date  . Night sweats   . Fatigue   . Wears glasses   . SOB (shortness of breath) on exertion     walking and stairts  . Arthritis   . Hot flashes   . Blood transfusion   . Diverticulitis of colon   . Headache(784.0)   . Anemia   . Breast cancer     T3N1 invasive ductal carcinoma left breast  . Bursitis   . Carpal tunnel syndrome   . Fibromyalgia 08/2012   BP 111/63  Pulse 70  Resp 16  Ht 5\' 3"  (1.6 m)  Wt 228 lb (103.42 kg)  BMI 40.4 kg/m2  SpO2 96%     Review of Systems  Respiratory: Positive for shortness of breath.   Cardiovascular: Positive for leg swelling.  Musculoskeletal: Positive for gait problem.  Neurological: Positive for  numbness.  All other systems reviewed and are negative.       Objective:   Physical Exam Constitutional: She is oriented to person, place, and time. She appears well-developed.  Obese  HENT:  Head: Normocephalic and atraumatic.  Right Ear: External ear normal.  Left Ear: External ear normal.  Eyes: Conjunctivae and EOM are normal. Pupils are equal, round, and reactive to light.  Neck: Normal range of motion.  Musculoskeletal:  Right hip: She exhibits decreased range of motion.  Left hip: She exhibits decreased range of motion.  Cervical back: Normal.  Lumbar back: She exhibits decreased range of motion.  Left shoulder restricted into abduction with external rotation. Negative straight leg raising test bilateral  Neurological: She is alert and oriented to person, place, and time. She has normal reflexes.  Psychiatric: She has a normal mood and affect.  Tenderness 18/18 fibromyalgia tender points  Sensation is hyperesthetic to pinprick bilateral hands and feet  She has decreased sensation at bilateral feet below the ankles  Reduced sensation to pinprick in the fingers, Forearms and arms  Motor strength is 4+/5 in bilateral deltoid, biceps, triceps, grip, hip flexor, knee extensors, ankle dorsiflexor and plantar flexor, mainly restricted by pain, patient does not put full effort in, when tested at some tests.  Skin shows a healing incision over the right volar wrist  Mood and affect are normal.  Symmetric normal motor tone is noted throughout. Normal muscle bulk. Fine motor movements are normal in both hands.  DTR in the upper and lower extremity are present and symmetric 1+. No clonus is noted.  Patient arises from chair with difficulty. Wide based gait with a cane. Unable to walk on heels and toes Tandem walk is not possible without help . No pronator drift. Rhomberg negative.        Assessment & Plan:  1. Diffuse body pain which predated her breast cancer diagnosis. This  patient has probable fibromyalgia syndrome, Increased Lyrica today to 150mg , 2 times a day, pharmacy did not receive prescription after last visit..We may need to titrate upward to TID next visit if not much better. Usual effective dose for fibromyalgia is 450 mg per day with Max dose of 600 mg per day.  2. Lumbar spondylosis, with facet arthropathy, L4-5, worse at L5-S1, discussed X-rays and further treatment in detail, prescribed Mobic, advised patient to not take the Mobic on an empty stomach. Ordered aquatic PT which will be beneficial to most of her symptoms, which she has started and which  is giving her some relief. Also demonstrated exercises and stretches for her left shoulder, which is slightly restricted into abduction with external rotation.  Recommended magnesium for her muscle cramps/pain, and Vit. B12 and B6 to support her nerve recovery.  Her symptoms may be amplified by the anostrazole  2. 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  Patient should continue with seeing a counselor to help with coping strategies.  Follow up in 2 month, spent 25 min with patient face to face.

## 2012-11-07 NOTE — Patient Instructions (Signed)
Continue with your aquatic therapy. 

## 2012-12-04 ENCOUNTER — Other Ambulatory Visit: Payer: Self-pay | Admitting: Oncology

## 2012-12-04 DIAGNOSIS — C50419 Malignant neoplasm of upper-outer quadrant of unspecified female breast: Secondary | ICD-10-CM

## 2012-12-19 ENCOUNTER — Ambulatory Visit (INDEPENDENT_AMBULATORY_CARE_PROVIDER_SITE_OTHER): Payer: Medicaid Other | Admitting: General Surgery

## 2012-12-30 ENCOUNTER — Encounter: Attending: Physical Medicine and Rehabilitation | Admitting: Physical Medicine and Rehabilitation

## 2012-12-30 ENCOUNTER — Encounter: Payer: Self-pay | Admitting: Physical Medicine and Rehabilitation

## 2012-12-30 VITALS — BP 112/64 | HR 80 | Resp 16 | Ht 63.0 in | Wt 234.0 lb

## 2012-12-30 DIAGNOSIS — Z923 Personal history of irradiation: Secondary | ICD-10-CM | POA: Insufficient documentation

## 2012-12-30 DIAGNOSIS — IMO0002 Reserved for concepts with insufficient information to code with codable children: Secondary | ICD-10-CM

## 2012-12-30 DIAGNOSIS — Z79899 Other long term (current) drug therapy: Secondary | ICD-10-CM | POA: Insufficient documentation

## 2012-12-30 DIAGNOSIS — M129 Arthropathy, unspecified: Secondary | ICD-10-CM | POA: Insufficient documentation

## 2012-12-30 DIAGNOSIS — C50919 Malignant neoplasm of unspecified site of unspecified female breast: Secondary | ICD-10-CM | POA: Insufficient documentation

## 2012-12-30 DIAGNOSIS — G589 Mononeuropathy, unspecified: Secondary | ICD-10-CM | POA: Insufficient documentation

## 2012-12-30 DIAGNOSIS — Z9221 Personal history of antineoplastic chemotherapy: Secondary | ICD-10-CM | POA: Insufficient documentation

## 2012-12-30 DIAGNOSIS — M47817 Spondylosis without myelopathy or radiculopathy, lumbosacral region: Secondary | ICD-10-CM | POA: Insufficient documentation

## 2012-12-30 DIAGNOSIS — R52 Pain, unspecified: Secondary | ICD-10-CM | POA: Insufficient documentation

## 2012-12-30 DIAGNOSIS — E669 Obesity, unspecified: Secondary | ICD-10-CM | POA: Insufficient documentation

## 2012-12-30 DIAGNOSIS — G622 Polyneuropathy due to other toxic agents: Secondary | ICD-10-CM

## 2012-12-30 MED ORDER — PREGABALIN 100 MG PO CAPS: 100.0000 mg | ORAL_CAPSULE | Freq: Three times a day (TID) | ORAL | Status: DC

## 2012-12-30 MED ORDER — MELOXICAM 15 MG PO TABS: 15.0000 mg | ORAL_TABLET | Freq: Every day | ORAL | Status: DC

## 2012-12-30 MED ORDER — METHOCARBAMOL 500 MG PO TABS: 500.0000 mg | ORAL_TABLET | Freq: Three times a day (TID) | ORAL | Status: DC

## 2012-12-30 NOTE — Patient Instructions (Signed)
Continue with exercising in the water

## 2012-12-30 NOTE — Progress Notes (Signed)
Subjective:    Patient ID: Kelsey Wilson, female    DOB: [birth year], 52 y.o.   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 Postoperative radiation therapy, pre-operative chemotherapy. Currently on Arimidex   Aching all over body, spasms in legs. Patient states that she has had this even before her cancer diagnosis. Worsening since cancer diagnosis. Cannot definitely tell whether or not it started right after starting on anastrozole.   The patient reports that she has finished the aquatic PT, which she enjoyed and which did give her some relief.She will now start to do aquatic exercises in a Y.  She also reports that she is seeing a counselor now, which really is helping her.  She reports that she has some trouble "thinking straight" with the higher dose of Lyrica, she even had some kind of visual hallucination at one point, she states that she did not have those with the lower dose.   Pain Inventory Average Pain 10 Pain Right Now 8 My pain is aching  In the last 24 hours, has pain interfered with the following? General activity 6 Relation with others 5 Enjoyment of life 7 What TIME of day is your pain at its worst? daytime Sleep (in general) Fair  Pain is worse with: walking, standing and some activites Pain improves with: rest and therapy/exercise Relief from Meds: 5  Mobility use a cane ability to climb steps?  yes do you drive?  yes Do you have any goals in this area?  yes  Function I need assistance with the following:  meal prep, household duties and shopping  Neuro/Psych numbness trouble walking spasms anxiety  Prior Studies Any changes since last visit?  no  Physicians involved in your care Any changes since last visit?  no   Family History  Problem Relation Age of Onset  . Cancer Paternal Grandmother     unknown  . Hypertension  Mother   . Diabetes Mother   . Hypertension Father   . Diabetes Father    History   Social History  . Marital Status: Single    Spouse Name: N/A    Number of Children: N/A  . Years of Education: N/A   Social History Main Topics  . Smoking status: Never Smoker   . Smokeless tobacco: Never Used  . Alcohol Use: No  . Drug Use: No  . Sexually Active: No   Other Topics Concern  . None   Social History Narrative  . None   Past Surgical History  Procedure Laterality Date  . Cholecystectomy    . Appendectomy    . Carpal tunnel release      Bilateral  . Tonsillectomy    . Spur      Apex spur on both big toes  . Knee surgery      Left Knee  . Portacath placement  06/18/2011    Procedure: INSERTION PORT-A-CATH;  Surgeon: Mariella Saa, MD;  Location: [facility];  Service: General;  Laterality: Right;  right subclavian  . Abdominal hysterectomy      still has ovaries  . Axillary lymph node dissection  11/28/2011    Procedure: AXILLARY LYMPH NODE DISSECTION;  Surgeon: Mariella Saa, MD;  Location: [facility];  Service: General;  Laterality: Left;  . Breast surgery  2013  . Nasal sinus surgery     Past Medical History  Diagnosis  Date  . Night sweats   . Fatigue   . Wears glasses   . SOB (shortness of breath) on exertion     walking and stairts  . Arthritis   . Hot flashes   . Blood transfusion   . Diverticulitis of colon   . Headache(784.0)   . Anemia   . Breast cancer     T3N1 invasive ductal carcinoma left breast  . Bursitis   . Carpal tunnel syndrome   . Fibromyalgia 08/2012   BP 112/64  Pulse 80  Resp 16  Ht 5\' 3"  (1.6 m)  Wt 234 lb (106.142 kg)  BMI 41.46 kg/m2  SpO2 98%     Review of Systems  Constitutional: Positive for diaphoresis and unexpected weight change.  Cardiovascular: Positive for leg swelling.  Gastrointestinal: Positive for abdominal pain.  Musculoskeletal: Positive for gait problem.  Neurological: Positive for  numbness.       Spasms  Psychiatric/Behavioral: Positive for agitation.  All other systems reviewed and are negative.       Objective:   Physical Exam Constitutional: She is oriented to person, place, and time. She appears well-developed.  Obese  HENT:  Head: Normocephalic and atraumatic.  Right Ear: External ear normal.  Left Ear: External ear normal.  Eyes: Conjunctivae and EOM are normal. Pupils are equal, round, and reactive to light.  Neck: Normal range of motion.  Musculoskeletal:  Right hip: She exhibits decreased range of motion.  Left hip: She exhibits decreased range of motion.  Cervical back: Normal.  Lumbar back: She exhibits decreased range of motion.  Left shoulder restricted into abduction with external rotation. Negative straight leg raising test bilateral  Neurological: She is alert and oriented to person, place, and time. She has normal reflexes.  Psychiatric: She has a normal mood and affect.  Tenderness 18/18 fibromyalgia tender points  Sensation is hyperesthetic to pinprick bilateral hands and feet  She has decreased sensation at bilateral feet below the ankles  Reduced sensation to pinprick in the fingers, Forearms and arms  Motor strength is 4+/5 in bilateral deltoid, biceps, triceps, grip, hip flexor, knee extensors, ankle dorsiflexor and plantar flexor, mainly restricted by pain, patient does not put full effort in, when tested at some tests.  Skin shows a healing incision over the right volar wrist  Mood and affect are normal.  Symmetric normal motor tone is noted throughout. Normal muscle bulk. Fine motor movements are normal in both hands.  DTR in the upper and lower extremity are present and symmetric 1+. No clonus is noted.  Patient arises from chair with difficulty. Wide based gait with a cane. Unable to walk on heels and toes Tandem walk is not possible without help . No pronator drift. Rhomberg negative.        Assessment & Plan:  1. Diffuse  body pain which predated her breast cancer diagnosis. This patient has probable fibromyalgia syndrome, decreased Lyrica today fom 150mg  tid, to 100mg  tid, if she still has cognitive difficulties, she should decrease to 100mg  bid after 1 week, I asked her to call us with any problems or questions .  2. Lumbar spondylosis, with facet arthropathy, L4-5, worse at L5-S1, discussed X-rays and further treatment in detail,at the last visit, also prescribed Mobic,at the last visit, which she tolerates well. Advised patient to not take the Mobic on an empty stomach.   Advised patient to start the aquatic exercise program and continue with the exercises and stretches for her left shoulder,  which is slightly restricted into abduction with external rotation, I showed her.  Recommended magnesium for her muscle cramps/pain, and Vit. B12 and B6 to support her nerve recovery, at the last visit, which she is doing and which is helping some.    2. 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  Patient should continue with seeing a counselor to help with coping strategies.  Follow up in 1 month.

## 2013-01-01 ENCOUNTER — Ambulatory Visit (INDEPENDENT_AMBULATORY_CARE_PROVIDER_SITE_OTHER): Payer: Medicaid Other | Admitting: General Surgery

## 2013-01-01 ENCOUNTER — Encounter (INDEPENDENT_AMBULATORY_CARE_PROVIDER_SITE_OTHER): Payer: Self-pay | Admitting: General Surgery

## 2013-01-01 VITALS — BP 110/60 | HR 92 | Resp 16 | Ht 63.0 in | Wt 232.2 lb

## 2013-01-01 DIAGNOSIS — C50419 Malignant neoplasm of upper-outer quadrant of unspecified female breast: Secondary | ICD-10-CM

## 2013-01-01 DIAGNOSIS — C50412 Malignant neoplasm of upper-outer quadrant of left female breast: Secondary | ICD-10-CM

## 2013-01-01 NOTE — Progress Notes (Signed)
Chief complaint: Followup breast cancer  History: Patient returns for more long-term followup status post left breast lumpectomy and axillary dissection following neoadjuvant chemotherapy for T3, N1, M0 cancer of the left breast mass status post radiation. Surgery date is 11/29/2011. She continues to have a lot of problems with chronic pain. She completed therapy with physical therapy but continues to have neuropathic type of pain in her hands and feet as well as aching leg pain and pain in her incisions in her breast and Port-A-Cath removal site as well. She reports however that with water therapy and chronic pain management the pain Center at these issues are somewhat improved. She has occasional minimal swelling in her left arm.  Exam: BP 110/60  Pulse 92  Resp 16  Ht 5\' 3"  (1.6 m)  Wt 232 lb 3.2 oz (105.325 kg)  BMI 41.14 kg/m2 General: No distress HEENT: No palpable masses. Sclera nonicteric Lymph nodes: No cervical, subclavicular or inguinal nodes palpable Lungs: Clear equal breath sounds bilaterally Breasts: Moderate postradiation changes on the left. Incisions well healed. No palpable mass or thickening at the lumpectomy site. Right breast is negative to exam.  Assessment and plan: No evidence of recurrent cancer. She continues to have chronic pain issues related to both her chemotherapy and surgery and radiation.  She has had some improvement with pain management and water therapy. No evidence of recurrent cancer. Return in 6 months.

## 2013-01-14 ENCOUNTER — Ambulatory Visit

## 2013-01-28 ENCOUNTER — Encounter: Attending: Physical Medicine and Rehabilitation | Admitting: Physical Medicine and Rehabilitation

## 2013-01-28 ENCOUNTER — Encounter: Payer: Self-pay | Admitting: Physical Medicine and Rehabilitation

## 2013-01-28 VITALS — BP 123/73 | HR 82 | Resp 16 | Ht 63.0 in | Wt 238.0 lb

## 2013-01-28 DIAGNOSIS — G609 Hereditary and idiopathic neuropathy, unspecified: Secondary | ICD-10-CM | POA: Insufficient documentation

## 2013-01-28 DIAGNOSIS — IMO0002 Reserved for concepts with insufficient information to code with codable children: Secondary | ICD-10-CM

## 2013-01-28 DIAGNOSIS — M47817 Spondylosis without myelopathy or radiculopathy, lumbosacral region: Secondary | ICD-10-CM

## 2013-01-28 DIAGNOSIS — G622 Polyneuropathy due to other toxic agents: Secondary | ICD-10-CM

## 2013-01-28 DIAGNOSIS — C50919 Malignant neoplasm of unspecified site of unspecified female breast: Secondary | ICD-10-CM | POA: Insufficient documentation

## 2013-01-28 DIAGNOSIS — R52 Pain, unspecified: Secondary | ICD-10-CM | POA: Insufficient documentation

## 2013-01-28 DIAGNOSIS — M129 Arthropathy, unspecified: Secondary | ICD-10-CM | POA: Insufficient documentation

## 2013-01-28 MED ORDER — METHOCARBAMOL 500 MG PO TABS: 500.0000 mg | ORAL_TABLET | Freq: Four times a day (QID) | ORAL | Status: DC

## 2013-01-28 NOTE — Progress Notes (Signed)
Subjective:    Patient ID: Kelsey Wilson, female    DOB: [birth year], 52 y.o.   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 Postoperative radiation therapy, pre-operative chemotherapy. Currently on Arimidex  Aching all over body, spasms in legs. Patient states that she has had this even before her cancer diagnosis. Worsening since cancer diagnosis. Cannot definitely tell whether or not it started right after starting on anastrozole.  The patient reports that she has finished the aquatic PT, which she enjoyed and which did give her some relief.She will now start to [facility] in a [facility].  She also reports that she is seeing a counselor now, which really is helping her.  She reported that she had some trouble "thinking straight" with the higher dose of Lyrica, she even had some kind of visual hallucination at one point, she states that she does not have those with the lower dose, she is taking now.  Pain Inventory Average Pain 9 Pain Right Now 9 My pain is dull and aching  In the last 24 hours, has pain interfered with the following? General activity 7 Relation with others 5 Enjoyment of life 8 What TIME of day is your pain at its worst? varies Sleep (in general) Fair  Pain is worse with: walking and standing Pain improves with: rest Relief from Meds: 0  Mobility use a cane ability to climb steps?  yes do you drive?  yes  Function Do you have any goals in this area?  no  Neuro/Psych numbness trouble walking spasms anxiety  Prior Studies Any changes since last visit?  no  Physicians involved in your care Any changes since last visit?  no   Family History  Problem Relation Age of Onset  . Cancer Paternal Grandmother     unknown  . Hypertension Mother   . Diabetes Mother   . Hypertension Father   . Diabetes Father    History   Social  History  . Marital Status: Single    Spouse Name: N/A    Number of Children: N/A  . Years of Education: N/A   Social History Main Topics  . Smoking status: Never Smoker   . Smokeless tobacco: Never Used  . Alcohol Use: No  . Drug Use: No  . Sexual Activity: No   Other Topics Concern  . None   Social History Narrative  . None   Past Surgical History  Procedure Laterality Date  . Cholecystectomy    . Appendectomy    . Carpal tunnel release      Bilateral  . Tonsillectomy    . Spur      Apex spur on both big toes  . Knee surgery      Left Knee  . Portacath placement  06/18/2011    Procedure: INSERTION PORT-A-CATH;  Surgeon: Mariella Saa, MD;  Location: [facility];  Service: General;  Laterality: Right;  right subclavian  . Abdominal hysterectomy      still has ovaries  . Axillary lymph node dissection  11/28/2011    Procedure: AXILLARY LYMPH NODE DISSECTION;  Surgeon: Mariella Saa, MD;  Location: [facility];  Service: General;  Laterality: Left;  . Breast surgery  2013  . Nasal sinus surgery     Past Medical History  Diagnosis Date  . Night sweats   . Fatigue   . Wears glasses   .  SOB (shortness of breath) on exertion     walking and stairts  . Arthritis   . Hot flashes   . Blood transfusion   . Diverticulitis of colon   . Headache(784.0)   . Anemia   . Breast cancer     T3N1 invasive ductal carcinoma left breast  . Bursitis   . Carpal tunnel syndrome   . Fibromyalgia 08/2012   BP 123/73  Pulse 82  Resp 16  Ht 5\' 3"  (1.6 m)  Wt 238 lb (107.956 kg)  BMI 42.17 kg/m2  SpO2 99%     Review of Systems  Musculoskeletal: Positive for back pain and gait problem.  Neurological: Positive for numbness.  Psychiatric/Behavioral: The patient is nervous/anxious.   All other systems reviewed and are negative.       Objective:   Physical Exam Constitutional: She is oriented to person, place, and time. She appears well-developed.  Obese   HENT:  Head: Normocephalic and atraumatic.  Right Ear: External ear normal.  Left Ear: External ear normal.  Eyes: Conjunctivae and EOM are normal. Pupils are equal, round, and reactive to light.  Neck: Normal range of motion.  Musculoskeletal:  Right hip: She exhibits decreased range of motion.  Left hip: She exhibits decreased range of motion.  Cervical back: Normal.  Lumbar back: She exhibits decreased range of motion.  Left shoulder restricted into abduction with external rotation. Negative straight leg raising test bilateral  Neurological: She is alert and oriented to person, place, and time. She has normal reflexes.  Psychiatric: She has a normal mood and affect.  Tenderness 18/18 fibromyalgia tender points  Sensation is hyperesthetic to pinprick bilateral hands and feet  She has decreased sensation at bilateral feet below the ankles  Reduced sensation to pinprick in the fingers, Forearms and arms  Motor strength is 4+/5 in bilateral deltoid, biceps, triceps, grip, hip flexor, knee extensors, ankle dorsiflexor and plantar flexor, mainly restricted by pain, patient does not put full effort in, when tested at some tests.  Skin shows a healing incision over the right volar wrist  Mood and affect are normal.  Symmetric normal motor tone is noted throughout. Normal muscle bulk. Fine motor movements are normal in both hands.  DTR in the upper and lower extremity are present and symmetric 1+. No clonus is noted.  Patient arises from chair with difficulty. Wide based gait with a cane. Unable to walk on heels and toes Tandem walk is not possible without help . No pronator drift. Rhomberg negative.        Assessment & Plan:  1. Diffuse body pain which predated her breast cancer diagnosis. This patient has probable fibromyalgia syndrome, decreased Lyrica today fom 150mg  tid, to 100mg  tid, if she still has cognitive difficulties, she should decrease to 100mg  bid after 1 week, I asked her  to call us with any problems or questions . She is doing well with this lower dose. 2. Lumbar spondylosis, with facet arthropathy, L4-5, worse at L5-S1, discussed X-rays and further treatment in detail,at the last visit, also prescribed Mobic,at the last visit, which she tolerates well. Advised patient to not take the Mobic on an empty stomach.  Advised patient to start the aquatic exercise program and continue with the exercises and stretches for her left shoulder, which is slightly restricted into abduction with external rotation, I showed her.  Recommended magnesium for her muscle cramps/pain, and Vit. B12 and B6 to support her nerve recovery, at the last visit, which  she is doing and which is helping some.Patient is also taking Robaxin 500mg , tid to qid on bad days.  2. 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  Patient should continue with seeing a counselor to help with coping strategies.  Follow up in 1 month.

## 2013-01-28 NOTE — Patient Instructions (Signed)
Re-start your exercise program.

## 2013-01-30 ENCOUNTER — Ambulatory Visit: Attending: Otolaryngology

## 2013-01-30 DIAGNOSIS — R490 Dysphonia: Secondary | ICD-10-CM | POA: Insufficient documentation

## 2013-01-30 DIAGNOSIS — IMO0001 Reserved for inherently not codable concepts without codable children: Secondary | ICD-10-CM | POA: Insufficient documentation

## 2013-02-06 ENCOUNTER — Encounter

## 2013-02-09 ENCOUNTER — Ambulatory Visit: Attending: Otolaryngology | Admitting: Speech Pathology

## 2013-02-09 DIAGNOSIS — R490 Dysphonia: Secondary | ICD-10-CM | POA: Insufficient documentation

## 2013-02-09 DIAGNOSIS — IMO0001 Reserved for inherently not codable concepts without codable children: Secondary | ICD-10-CM | POA: Insufficient documentation

## 2013-02-20 ENCOUNTER — Ambulatory Visit

## 2013-02-23 ENCOUNTER — Other Ambulatory Visit: Payer: Self-pay | Admitting: Physical Medicine and Rehabilitation

## 2013-02-27 ENCOUNTER — Other Ambulatory Visit (INDEPENDENT_AMBULATORY_CARE_PROVIDER_SITE_OTHER): Payer: Self-pay | Admitting: *Deleted

## 2013-02-27 ENCOUNTER — Ambulatory Visit

## 2013-02-27 DIAGNOSIS — C50919 Malignant neoplasm of unspecified site of unspecified female breast: Secondary | ICD-10-CM

## 2013-02-27 MED ORDER — UNABLE TO FIND: Status: DC

## 2013-03-02 ENCOUNTER — Ambulatory Visit

## 2013-03-19 ENCOUNTER — Telehealth: Payer: Self-pay | Admitting: *Deleted

## 2013-03-19 ENCOUNTER — Encounter (INDEPENDENT_AMBULATORY_CARE_PROVIDER_SITE_OTHER): Payer: Self-pay

## 2013-03-19 ENCOUNTER — Other Ambulatory Visit (HOSPITAL_BASED_OUTPATIENT_CLINIC_OR_DEPARTMENT_OTHER): Admitting: Lab

## 2013-03-19 ENCOUNTER — Ambulatory Visit (HOSPITAL_BASED_OUTPATIENT_CLINIC_OR_DEPARTMENT_OTHER): Admitting: Oncology

## 2013-03-19 VITALS — BP 118/77 | HR 83 | Temp 98.0°F | Resp 20 | Ht 63.0 in | Wt 242.4 lb

## 2013-03-19 DIAGNOSIS — Z170 Estrogen receptor positive status [ER+]: Secondary | ICD-10-CM | POA: Insufficient documentation

## 2013-03-19 DIAGNOSIS — C50419 Malignant neoplasm of upper-outer quadrant of unspecified female breast: Secondary | ICD-10-CM

## 2013-03-19 DIAGNOSIS — C50412 Malignant neoplasm of upper-outer quadrant of left female breast: Secondary | ICD-10-CM

## 2013-03-19 LAB — CBC WITH DIFFERENTIAL/PLATELET
BASO%: 0.4 % (ref 0.0–2.0)
Basophils Absolute: 0 10*3/uL (ref 0.0–0.1)
EOS%: 4.3 % (ref 0.0–7.0)
Eosinophils Absolute: 0.1 10*3/uL (ref 0.0–0.5)
HCT: 35.8 % (ref 34.8–46.6)
HGB: 12.1 g/dL (ref 11.6–15.9)
LYMPH%: 49.8 % — ABNORMAL HIGH (ref 14.0–49.7)
MCH: 28.9 pg (ref 25.1–34.0)
MCHC: 33.8 g/dL (ref 31.5–36.0)
MCV: 85.4 fL (ref 79.5–101.0)
MONO#: 0.3 10*3/uL (ref 0.1–0.9)
MONO%: 10.1 % (ref 0.0–14.0)
NEUT#: 1 10*3/uL — ABNORMAL LOW (ref 1.5–6.5)
NEUT%: 35.4 % — ABNORMAL LOW (ref 38.4–76.8)
Platelets: 139 10*3/uL — ABNORMAL LOW (ref 145–400)
RBC: 4.19 10*6/uL (ref 3.70–5.45)
RDW: 13.5 % (ref 11.2–14.5)
WBC: 2.8 10*3/uL — ABNORMAL LOW (ref 3.9–10.3)
lymph#: 1.4 10*3/uL (ref 0.9–3.3)
nRBC: 0 % (ref 0–0)

## 2013-03-19 LAB — COMPREHENSIVE METABOLIC PANEL (CC13)
ALT: 13 U/L (ref 0–55)
AST: 17 U/L (ref 5–34)
Albumin: 3.5 g/dL (ref 3.5–5.0)
Alkaline Phosphatase: 63 U/L (ref 40–150)
Anion Gap: 10 mEq/L (ref 3–11)
BUN: 18.3 mg/dL (ref 7.0–26.0)
CO2: 28 mEq/L (ref 22–29)
Calcium: 9.7 mg/dL (ref 8.4–10.4)
Chloride: 105 mEq/L (ref 98–109)
Creatinine: 0.8 mg/dL (ref 0.6–1.1)
Glucose: 86 mg/dl (ref 70–140)
Potassium: 3.8 mEq/L (ref 3.5–5.1)
Sodium: 142 mEq/L (ref 136–145)
Total Bilirubin: 0.81 mg/dL (ref 0.20–1.20)
Total Protein: 7 g/dL (ref 6.4–8.3)

## 2013-03-19 LAB — LACTATE DEHYDROGENASE (CC13): LDH: 215 U/L (ref 125–245)

## 2013-03-19 MED ORDER — ANASTROZOLE 1 MG PO TABS: 1.0000 mg | ORAL_TABLET | Freq: Every day | ORAL | Status: DC

## 2013-03-19 NOTE — Progress Notes (Signed)
[facility]  Telephone:([phone]) [phone] Fax:([phone]) [phone]  OFFICE PROGRESS NOTE    ID: Kelsey Wilson   DOB: 04-01-1961  MR#: [id number]  GUY#:[id number]  PCP: Kelsey Livings, MD GYN:  SU: Kelsey Fellows, MD OTHER MD: Kelsey Wilson,  Kelsey Igo, MD, Kelsey Laws mD   HISTORY OF PRESENT ILLNESS: From Dr. Renelda Wilson note 06/13/2011:  "The patient first noticed a lump in the upper outer left breast about mid November. She had been having regular mammograms. She consulted her physician and was referred to the breast center for further workup. Bilateral mammogram was performed which revealed an irregular mass in the upper outer quadrant of the left breast at the 2:00 position measuring approximately 3 cm. A clearly enlarged left axillary lymph node was also seen. 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 Ki-67 was 100%. Subsequent bilateral breast MRI was performed. This reveals abnormal enhancement in the area of the mass measuring 2.9 x 5.2 cm. Again noted was an approximately 3 cm axillary lymph node. No other areas were detected."  Her subsequent history is as detailed below.  INTERVAL HISTORY: The patient returns today for routine followup of her breast cancer. The interval history is generally unremarkable. She is now on disability. She is going to the pain clinic and she tells me her pain is better controlled on her current medications. [facility]  REVIEW OF SYSTEMS: Although there has been some improvement, there are still many chronic problems. Her hands and feet hurt, particularly the plantar aspect of the right foot more than the left. On the other hand her left ankle swells more than the right. She sleeps very poorly he is tired all the time, and then stop taking a nap most  days, which of course complicates her bedtime sleep hygiene. In addition to the pain in her hands and feet she really hurts "all over" and she describes the pain as throbbing aching and as fairly constant. She needs reading glasses. She has some hoarseness, a little bit of a dry cough, and a feeling in her throat that sometimes things get stuck. She is very short of breath when walking up stairs. She uses a cane to walk. She has occasional headaches, which are not more frequent or intense than prior. She feels forgetful and depressed. She has moderate hot flashes. She is eating too much, and has an uncontrolled craving for suite. She is very irritable about things like noises, crying, and feels fussy with her grandchildren. Otherwise a detailed review of systems was noncontributory  PAST MEDICAL HISTORY: Past Medical History  Diagnosis Date  . Night sweats   . Fatigue   . Wears glasses   . SOB (shortness of breath) on exertion     walking and stairts  . Arthritis   . Hot flashes   . Blood transfusion   . Diverticulitis of colon   . Headache(784.0)   . Anemia   . Breast cancer     T3N1 invasive ductal carcinoma left breast  . Bursitis   . Carpal tunnel syndrome   . Fibromyalgia 08/2012    PAST SURGICAL HISTORY: Past Surgical History  Procedure Laterality Date  . Cholecystectomy    . Appendectomy    . Carpal tunnel release      Bilateral  . Tonsillectomy    . Spur  Apex spur on both big toes  . Knee surgery      Left Knee  . Portacath placement  06/18/2011    Procedure: INSERTION PORT-A-CATH;  Surgeon: Kelsey Saa, MD;  Location: [facility];  Service: General;  Laterality: Right;  right subclavian  . Abdominal hysterectomy      still has ovaries  . Axillary lymph node dissection  11/28/2011    Procedure: AXILLARY LYMPH NODE DISSECTION;  Surgeon: Kelsey Saa, MD;  Location: [facility];  Service: General;  Laterality: Left;  . Breast surgery  2013  .  Nasal sinus surgery      FAMILY HISTORY Family History  Problem Relation Age of Onset  . Cancer Paternal Grandmother     unknown  . Hypertension Mother   . Diabetes Mother   . Hypertension Father   . Diabetes Father   Her father, 58 years old is the minister at [employer] she attends. Her mother is 7. The patient has 3 brothers and 3 sisters. There is no history of breast or ovarian cancer in the immediate family.  GYNECOLOGIC HISTORY: Menarche age 44, first live birth age 75, she is GX P3. She underwent hysterectomy without salpingo-oophorectomy in 2000. She never took hormone replacement.   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 She is divorced. At home she lives with her son's  2 children, aged 26 and 7. Her daughter Kelsey Wilson and her 3 children used to live with her but have moved out. Her son Kelsey Wilson works as a Naval architect. The patient has 4 additional grandchildren. She attends a [employer].    ADVANCED DIRECTIVES: Not in place  HEALTH MAINTENANCE: History  Substance Use Topics  . Smoking status: Never Smoker   . Smokeless tobacco: Never Used  . Alcohol Use: No     Colonoscopy: Not on file  PAP: Not on file   Bone density: Bone density scan on 04/09/2012 showed a T score of 1.2 (normal).  Lipid panel: Not on file  Allergies  Allergen Reactions  . Compazine Other (See Comments)    Numbness of face and lips  . Aleve [Naproxen] Nausea Only  . Oxycodone     hallucinations  . Penicillins Nausea Only    Current Outpatient Prescriptions  Medication Sig Dispense Refill  . acetaminophen (TYLENOL) 500 MG tablet Take 500 mg by mouth as needed for pain.      Marland Kitchen ALPRAZolam (XANAX) 0.5 MG tablet Take 0.5 mg by mouth 2 (two) times daily as needed for sleep.      Marland Kitchen anastrozole (ARIMIDEX) 1 MG tablet Take 1 tablet (1 mg total) by mouth daily.  30 tablet  4  . Cholecalciferol (VITAMIN D) 2000 UNITS CAPS Take 1  capsule by mouth daily.      . diclofenac sodium (VOLTAREN) 1 % GEL Apply 2 g topically 4 (four) times daily.  2 Tube  2  . doxycycline (VIBRAMYCIN) 100 MG capsule Take 100 mg by mouth 2 (two) times daily.      . meloxicam (MOBIC) 15 MG tablet TAKE 1 TABLET BY MOUTH ONCE DAILY  30 tablet  1  . methocarbamol (ROBAXIN) 500 MG tablet Take 1 tablet (500 mg total) by mouth 4 (four) times daily.  100 tablet  1  . pregabalin (LYRICA) 100 MG capsule Take 1 capsule (100 mg total) by mouth 3 (three) times daily.  90 capsule  2  . traMADol (ULTRAM)  50 MG tablet TAKE 1 TABLET BY MOUTH EVERY 6 HOURS AS NEEDED FOR PAIN  90 tablet  2  . triamterene-hydrochlorothiazide (MAXZIDE-25) 37.5-25 MG per tablet Take 1 tablet by mouth daily.      Marland Kitchen UNABLE TO FIND Rx: L8020- Non-Silicone Breast Prosthesis (Quantity: 1) Dx: 174.9; Left Breast Lumpectomy  1 each  0   No current facility-administered medications for this visit.    OBJECTIVE: Middle-aged [place] American woman who appears stated age 7 Vitals:   03/19/13 0907  BP: 118/77  Pulse: 83  Temp: 98 F (36.7 C)  Resp: 20     Body mass index is 42.95 kg/(m^2).    ECOG FS: 2  Sclerae unicteric, pupils equal round and reactive to light Oropharynx c no thrush or other lesions No cervical or supraclavicular adenopathy Lungs no rales or rhonchi Heart regular rate and rhythm Abd soft, obese, nontender, positive bowel sounds n MSK scoliosis, but no focal spinal tenderness, chronic left upper extremity lymphedema, grade 1 Neuro: nonfocal, well oriented, friendly affect Breasts: The right breast is unremarkable. The left breast is status post lumpectomy and radiation. There is no evidence of local recurrence. The left axilla is benign.    LAB RESULTS: Lab Results  Component Value Date   WBC 2.8* 03/19/2013   NEUTROABS 1.0* 03/19/2013   HGB 12.1 03/19/2013   HCT 35.8 03/19/2013   MCV 85.4 03/19/2013   PLT 139* 03/19/2013      Chemistry       Component Value Date/Time   NA 138 09/18/2012 0853   NA 135 02/11/2012 1450   K 3.9 09/18/2012 0853   K 3.9 02/11/2012 1450   CL 101 09/18/2012 0853   CL 101 02/11/2012 1450   CO2 27 09/18/2012 0853   CO2 26 02/11/2012 1450   BUN 13.5 09/18/2012 0853   BUN 13 02/11/2012 1450   CREATININE 0.8 09/18/2012 0853   CREATININE 0.53 02/11/2012 1450      Component Value Date/Time   CALCIUM 9.6 09/18/2012 0853   CALCIUM 9.0 02/11/2012 1450   ALKPHOS 70 09/18/2012 0853   ALKPHOS 60 11/08/2011 1138   AST 17 09/18/2012 0853   AST 17 11/08/2011 1138   ALT 12 09/18/2012 0853   ALT 9 11/08/2011 1138   BILITOT 1.27* 09/18/2012 0853   BILITOT 1.1 11/08/2011 1138      Lab Results  Component Value Date   LABCA2 41* 06/13/2011    Urinalysis    Component Value Date/Time   COLORURINE YELLOW 02/11/2012 1343   APPEARANCEUR CLEAR 02/11/2012 1343   LABSPEC 1.010 02/11/2012 1343   PHURINE 6.5 02/11/2012 1343   GLUCOSEU NEGATIVE 02/11/2012 1343   HGBUR NEGATIVE 02/11/2012 1343   BILIRUBINUR NEGATIVE 02/11/2012 1343   KETONESUR NEGATIVE 02/11/2012 1343   PROTEINUR NEGATIVE 02/11/2012 1343   UROBILINOGEN 0.2 02/11/2012 1343   NITRITE NEGATIVE 02/11/2012 1343   LEUKOCYTESUR NEGATIVE 02/11/2012 1343    STUDIES: No results found. Repeat mammography scheduled at the breast center December 2014   ASSESSMENT: 52 y.o. [place], [place] woman:  (1) Status post left breast biopsy 06/01/2011 for a cT2 pN1 , stage IIB invasive ductal carcinoma, grade 3, estrogen receptor 80% and progesterone receptor 9% positive, with no HER-2 amplification and an MIB-1 of 100%  (2) Treated neoadjuvantly with 4 cycles of cyclophosphamide, epirubicin and fluorouracil, followed by 4 cycles of docetaxel, completed 11/01/2011  (3) Status post left lumpectomy and axillary lymph node resection 11/28/2011, showing a complete pathologic response (no residual  invasive or in situ cancer in the breast and 0 of 17 lymph nodes involved)  (4) Adjuvant radiation therapy  completed 03/31/2012  (5) Started anastrozole November 2013; normal bone density scan 04/09/2012  (6) chronic Left upper extremity lymphedema  PLAN:  Tina is doing well from a breast cancer point of view. There is no evidence of disease recurrence. Given her complete pathologic response to chemotherapy, she has a good prognosis. She is tolerating the anastrozole well--I do not believe the aches and pains she is having are really related to this medication.  She is going to be seeing Dr. Johna Sheriff in January, to review the results of her upcoming mammogram. She will see [place] again in April. She will then see Dr. Johna Sheriff again in July and see me again in October. At that point we will start seeing her on a once a year basis. She will be due for repeat bone density in November of 2015. Once she has been on anastrozole 2 years we will consider switching to tamoxifen.  She knows to call for any problems that may develop before her next visit here.  Sandeep Delagarza C  MD  03/19/2013   9:50 AM

## 2013-03-19 NOTE — Telephone Encounter (Signed)
appts made and printed...td 

## 2013-03-20 LAB — VITAMIN D 25 HYDROXY (VIT D DEFICIENCY, FRACTURES): Vit D, 25-Hydroxy: 41 ng/mL (ref 30–89)

## 2013-03-25 ENCOUNTER — Encounter: Attending: Physical Medicine and Rehabilitation | Admitting: Physical Medicine and Rehabilitation

## 2013-03-25 ENCOUNTER — Encounter: Payer: Self-pay | Admitting: Physical Medicine and Rehabilitation

## 2013-03-25 ENCOUNTER — Other Ambulatory Visit: Payer: Self-pay | Admitting: Oncology

## 2013-03-25 VITALS — BP 120/69 | HR 72 | Resp 14 | Ht 63.0 in | Wt 240.6 lb

## 2013-03-25 DIAGNOSIS — G622 Polyneuropathy due to other toxic agents: Secondary | ICD-10-CM

## 2013-03-25 DIAGNOSIS — R52 Pain, unspecified: Secondary | ICD-10-CM | POA: Insufficient documentation

## 2013-03-25 DIAGNOSIS — M47817 Spondylosis without myelopathy or radiculopathy, lumbosacral region: Secondary | ICD-10-CM

## 2013-03-25 DIAGNOSIS — G609 Hereditary and idiopathic neuropathy, unspecified: Secondary | ICD-10-CM | POA: Insufficient documentation

## 2013-03-25 DIAGNOSIS — Z853 Personal history of malignant neoplasm of breast: Secondary | ICD-10-CM | POA: Insufficient documentation

## 2013-03-25 DIAGNOSIS — C50419 Malignant neoplasm of upper-outer quadrant of unspecified female breast: Secondary | ICD-10-CM

## 2013-03-25 DIAGNOSIS — IMO0002 Reserved for concepts with insufficient information to code with codable children: Secondary | ICD-10-CM

## 2013-03-25 DIAGNOSIS — Z79899 Other long term (current) drug therapy: Secondary | ICD-10-CM | POA: Insufficient documentation

## 2013-03-25 MED ORDER — MELOXICAM 15 MG PO TABS: ORAL_TABLET | ORAL | Status: DC

## 2013-03-25 MED ORDER — METHOCARBAMOL 500 MG PO TABS: 500.0000 mg | ORAL_TABLET | Freq: Four times a day (QID) | ORAL | Status: DC

## 2013-03-25 NOTE — Patient Instructions (Addendum)
Continue with your Live Strong program. Try to exercise and walk in the pool

## 2013-03-25 NOTE — Progress Notes (Signed)
Subjective:    Patient ID: Kelsey Wilson, female    DOB: 1961/02/03, 52 y.o.   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 Postoperative radiation therapy, pre-operative chemotherapy. Currently on Arimidex  Aching all over body, spasms in legs. Patient states that she has had this even before her cancer diagnosis. Worsening since cancer diagnosis. Cannot definitely tell whether or not it started right after starting on anastrozole.  The patient reports that she has finished the aquatic PT, which she enjoyed and which did give her some relief.She will now start to do aquatic exercises in a Y.  She also reports that she is seeing a counselor now, which really is helping her.  She reported that she had some trouble "thinking straight" with the higher dose of Lyrica, she even had some kind of visual hallucination at one point, she states that she does not have those with the lower dose, she is taking now.  Pain Inventory Average Pain 9 Pain Right Now 8 My pain is tingling and aching  In the last 24 hours, has pain interfered with the following? General activity 6 Relation with others 8 Enjoyment of life 10 What TIME of day is your pain at its worst? morning Sleep (in general) Fair  Pain is worse with: walking, bending, sitting and standing Pain improves with: rest and medication Relief from Meds: 2  Mobility walk with assistance use a cane ability to climb steps?  no do you drive?  yes Do you have any goals in this area?  yes  Function not employed: date last employed na I need assistance with the following:  household duties Do you have any goals in this area?  yes  Neuro/Psych weakness numbness trouble walking depression anxiety  Prior Studies Any changes since last visit?  no  Physicians involved in your care Any changes since last visit?   no   Family History  Problem Relation Age of Onset  . Cancer Paternal Grandmother     unknown  . Hypertension Mother   . Diabetes Mother   . Hypertension Father   . Diabetes Father    History   Social History  . Marital Status: Single    Spouse Name: N/A    Number of Children: N/A  . Years of Education: N/A   Social History Main Topics  . Smoking status: Never Smoker   . Smokeless tobacco: Never Used  . Alcohol Use: No  . Drug Use: No  . Sexual Activity: No   Other Topics Concern  . None   Social History Narrative  . None   Past Surgical History  Procedure Laterality Date  . Cholecystectomy    . Appendectomy    . Carpal tunnel release      Bilateral  . Tonsillectomy    . Spur      Apex spur on both big toes  . Knee surgery      Left Knee  . Portacath placement  06/18/2011    Procedure: INSERTION PORT-A-CATH;  Surgeon: Mariella Saa, MD;  Location: [facility];  Service: General;  Laterality: Right;  right subclavian  . Abdominal hysterectomy      still has ovaries  . Axillary lymph node dissection  11/28/2011    Procedure: AXILLARY LYMPH NODE DISSECTION;  Surgeon: Mariella Saa, MD;  Location: [facility];  Service: General;  Laterality: Left;  .  Breast surgery  2013  . Nasal sinus surgery     Past Medical History  Diagnosis Date  . Night sweats   . Fatigue   . Wears glasses   . SOB (shortness of breath) on exertion     walking and stairts  . Arthritis   . Hot flashes   . Blood transfusion   . Diverticulitis of colon   . Headache(784.0)   . Anemia   . Breast cancer     T3N1 invasive ductal carcinoma left breast  . Bursitis   . Carpal tunnel syndrome   . Fibromyalgia 08/2012   BP 120/69  Pulse 72  Resp 14  Ht 5\' 3"  (1.6 m)  Wt 240 lb 9.6 oz (109.135 kg)  BMI 42.63 kg/m2  SpO2 96%      Review of Systems  Musculoskeletal: Positive for gait problem.  Neurological: Positive for weakness and numbness.   Psychiatric/Behavioral: Positive for dysphoric mood. The patient is nervous/anxious.   All other systems reviewed and are negative.       Objective:   Physical Exam Constitutional: She is oriented to person, place, and time. She appears well-developed.  Obese  HENT:  Head: Normocephalic and atraumatic.  Right Ear: External ear normal.  Left Ear: External ear normal.  Eyes: Conjunctivae and EOM are normal. Pupils are equal, round, and reactive to light.  Neck: Normal range of motion.  Musculoskeletal:  Right hip: She exhibits decreased range of motion.  Left hip: She exhibits decreased range of motion.  Cervical back: Normal.  Lumbar back: She exhibits decreased range of motion.  Left shoulder restricted into abduction with external rotation. Negative straight leg raising test bilateral  Neurological: She is alert and oriented to person, place, and time. She has normal reflexes.  Psychiatric: She has a normal mood and affect.  Tenderness 18/18 fibromyalgia tender points  Sensation is hyperesthetic to pinprick bilateral hands and feet  She has decreased sensation at bilateral feet below the ankles  Reduced sensation to pinprick in the fingers, Forearms and arms  Motor strength is 4+/5 in bilateral deltoid, biceps, triceps, grip, hip flexor, knee extensors, ankle dorsiflexor and plantar flexor, mainly restricted by pain, patient does not put full effort in, when tested at some tests.  Skin shows a healing incision over the right volar wrist  Mood and affect are normal.  Symmetric normal motor tone is noted throughout. Normal muscle bulk. Fine motor movements are normal in both hands.  DTR in the upper and lower extremity are present and symmetric 1+. No clonus is noted.  Patient arises from chair with difficulty. Wide based gait with a cane. Unable to walk on heels and toes Tandem walk is not possible without help . No pronator drift. Rhomberg negative.        Assessment &  Plan:  1. Diffuse body pain which predated her breast cancer diagnosis. This patient has probable fibromyalgia syndrome, decreased Lyrica today fom 150mg  tid, to 100mg  tid, if she still has cognitive difficulties, she should decrease to 100mg  bid after 1 week, I asked her to call us with any problems or questions . She is doing well with this lower dose.  2. Lumbar spondylosis, with facet arthropathy, L4-5, worse at L5-S1, discussed X-rays and further treatment in detail,at the last visit, also prescribed Mobic,at the last visit, which she tolerates well. Advised patient to not take the Mobic on an empty stomach.  Advised patient to start the aquatic exercise program and continue with  the exercises and stretches for her left shoulder, which is slightly restricted into abduction with external rotation, I showed her.  Recommended magnesium for her muscle cramps/pain, and Vit. B12 and B6 to support her nerve recovery, at the last visit, which she is doing and which is helping some.Patient is also taking Robaxin 500mg , tid to qid on bad days.  2. 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  Patient should continue with seeing a counselor to help with coping strategies.  Follow up in 3 month

## 2013-04-08 ENCOUNTER — Telehealth: Payer: Self-pay

## 2013-04-08 DIAGNOSIS — C50419 Malignant neoplasm of upper-outer quadrant of unspecified female breast: Secondary | ICD-10-CM

## 2013-04-08 MED ORDER — TRAMADOL HCL 50 MG PO TABS: ORAL_TABLET | ORAL | Status: DC

## 2013-04-08 NOTE — Telephone Encounter (Signed)
We can prescribe/ refill it for this month,she has been on this medication and is tolerating it well, then she should be seen . We can give her a Rx Tramadol 50mg  q 6 hrs, # 90 for one month

## 2013-04-08 NOTE — Telephone Encounter (Signed)
Patient called requesting tramadol refill to [facility].  Last prescriber wants [place] to take over management of medication.  Please advise.

## 2013-04-08 NOTE — Telephone Encounter (Signed)
Tramadol called into [facility].  Informed patient.

## 2013-04-10 ENCOUNTER — Encounter: Payer: Self-pay | Admitting: Physical Medicine & Rehabilitation

## 2013-04-10 ENCOUNTER — Encounter: Attending: Physical Medicine and Rehabilitation

## 2013-04-10 ENCOUNTER — Ambulatory Visit (HOSPITAL_BASED_OUTPATIENT_CLINIC_OR_DEPARTMENT_OTHER): Admitting: Physical Medicine & Rehabilitation

## 2013-04-10 VITALS — BP 112/62 | HR 84 | Resp 14 | Ht 63.0 in | Wt 238.8 lb

## 2013-04-10 DIAGNOSIS — M797 Fibromyalgia: Secondary | ICD-10-CM

## 2013-04-10 DIAGNOSIS — R52 Pain, unspecified: Secondary | ICD-10-CM | POA: Insufficient documentation

## 2013-04-10 DIAGNOSIS — IMO0002 Reserved for concepts with insufficient information to code with codable children: Secondary | ICD-10-CM

## 2013-04-10 DIAGNOSIS — IMO0001 Reserved for inherently not codable concepts without codable children: Secondary | ICD-10-CM

## 2013-04-10 DIAGNOSIS — M129 Arthropathy, unspecified: Secondary | ICD-10-CM | POA: Insufficient documentation

## 2013-04-10 DIAGNOSIS — M47817 Spondylosis without myelopathy or radiculopathy, lumbosacral region: Secondary | ICD-10-CM

## 2013-04-10 DIAGNOSIS — C50919 Malignant neoplasm of unspecified site of unspecified female breast: Secondary | ICD-10-CM | POA: Insufficient documentation

## 2013-04-10 DIAGNOSIS — G622 Polyneuropathy due to other toxic agents: Secondary | ICD-10-CM

## 2013-04-10 DIAGNOSIS — G609 Hereditary and idiopathic neuropathy, unspecified: Secondary | ICD-10-CM | POA: Insufficient documentation

## 2013-04-10 MED ORDER — NORTRIPTYLINE HCL 10 MG PO CAPS: 10.0000 mg | ORAL_CAPSULE | Freq: Every day | ORAL | Status: DC

## 2013-04-10 NOTE — Patient Instructions (Signed)
stop methocarbamol  Continue Lyrica 100 mg 3 times per day  Continue tramadol 50 mg 3 times a day  New medicine is nortriptyline 10 mg at night  Be checked one month

## 2013-04-10 NOTE — Progress Notes (Signed)
Subjective:    Patient ID: Kelsey Wilson, female    DOB: 04/27/1961, 52 y.o.   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 Postoperative radiation therapy, postoperative chemotherapy. Currently on Arimidex  Pain around site of port removal right chest  Aching all over body spasms in legs. Patient states that she has had this even before her cancer diagnosis. Worsening since cancer diagnosis. Cannot definitely tell whether or not it started right after starting on anastrozole  Bilateral foot and hand pain seems to worse. Tried increasing Lyrica to 150 mg 3 times a day. This however resulted in increased confusion she is now back to 100 mg 3 times a day. Side effects have improved. Also has back pain. Has finished intensive treatment for left breast carcinoma. Continues on her arimidex  Pain Inventory Average Pain 8 Pain Right Now 10 My pain is aching  In the last 24 hours, has pain interfered with the following? General activity 7 Relation with others 7 Enjoyment of life 10 What TIME of day is your pain at its worst? morning, night Sleep (in general) Fair  Pain is worse with: walking, bending, sitting, standing and some activites Pain improves with: rest Relief from Meds: 2  Mobility walk with assistance use a cane ability to climb steps?  yes do you drive?  yes Do you have any goals in this area?  yes  Function disabled: date disabled na I need assistance with the following:  meal prep and household duties  Neuro/Psych weakness numbness trouble walking spasms anxiety  Prior Studies Any changes since last visit?  no  Physicians involved in your care Any changes since last visit?  no   Family History  Problem Relation Age of Onset  . Cancer Paternal Grandmother     unknown  . Hypertension Mother   . Diabetes Mother   . Hypertension  Father   . Diabetes Father    History   Social History  . Marital Status: Single    Spouse Name: N/A    Number of Children: N/A  . Years of Education: N/A   Social History Main Topics  . Smoking status: Never Smoker   . Smokeless tobacco: Never Used  . Alcohol Use: No  . Drug Use: No  . Sexual Activity: No   Other Topics Concern  . None   Social History Narrative  . None   Past Surgical History  Procedure Laterality Date  . Cholecystectomy    . Appendectomy    . Carpal tunnel release      Bilateral  . Tonsillectomy    . Spur      Apex spur on both big toes  . Knee surgery      Left Knee  . Portacath placement  06/18/2011    Procedure: INSERTION PORT-A-CATH;  Surgeon: Mariella Saa, MD;  Location: [facility];  Service: General;  Laterality: Right;  right subclavian  . Abdominal hysterectomy      still has ovaries  . Axillary lymph node dissection  11/28/2011    Procedure: AXILLARY LYMPH NODE DISSECTION;  Surgeon: Mariella Saa, MD;  Location: [facility];  Service: General;  Laterality: Left;  . Breast surgery  2013  . Nasal sinus surgery     Past Medical History  Diagnosis Date  . Night sweats   . Fatigue   . Wears glasses   .  SOB (shortness of breath) on exertion     walking and stairts  . Arthritis   . Hot flashes   . Blood transfusion   . Diverticulitis of colon   . Headache(784.0)   . Anemia   . Breast cancer     T3N1 invasive ductal carcinoma left breast  . Bursitis   . Carpal tunnel syndrome   . Fibromyalgia 08/2012   BP 112/62  Pulse 84  Resp 14  Ht 5\' 3"  (1.6 m)  Wt 238 lb 12.8 oz (108.319 kg)  BMI 42.31 kg/m2  SpO2 94%      Review of Systems  Constitutional: Positive for unexpected weight change.  Cardiovascular: Positive for leg swelling.  Musculoskeletal: Positive for gait problem and myalgias.  Neurological: Positive for weakness and numbness.       Spasms  Psychiatric/Behavioral: The patient is  nervous/anxious.        Objective:   Physical Exam  Nursing note and vitals reviewed. Constitutional: She appears well-developed and well-nourished.  HENT:  Head: Normocephalic and atraumatic.  Eyes: Conjunctivae and EOM are normal. Pupils are equal, round, and reactive to light.  Neck: Normal range of motion.  Psychiatric: She has a normal mood and affect.   Sensory exam decreased to pinprick and a patchy distribution bilateral hands and feet. Relative sparing of L5 compared to L4 and S1. Sensation does improve above the ankle and wrist.  Motor strength is 4/5 bilateral deltoid, bicep, tricep, grip, hip flexors, knee extensors 3 minus ankle dorsiflexor plantar flexor and toe flexor extensor. Some of this is pain limited.  Skin is warm to the touch good pedal pulses. No skin lesions. There is some hypersensitivity to touch over the dorsum and lateral aspect of the foot.       Assessment & Plan:  #1. 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 She's been tried on gabapentin in the past which was not typically helpful. She is also tried on Lyrica higher doses which resulted in side effects. She is taking tramadol 50 mg 3 times a day however I think she can tolerate nortriptyline 10 mg each bedtime without causing excessive serotonin elevation.  I recommend discontinuing Robaxin 500 mg 3 times a day Some of her daytime sedation may be related to Xanax which she takes 0.5 mg twice a day as well

## 2013-04-21 ENCOUNTER — Other Ambulatory Visit: Payer: Self-pay | Admitting: Oncology

## 2013-04-21 DIAGNOSIS — Z853 Personal history of malignant neoplasm of breast: Secondary | ICD-10-CM

## 2013-05-11 ENCOUNTER — Encounter: Attending: Physical Medicine and Rehabilitation

## 2013-05-11 ENCOUNTER — Encounter: Payer: Self-pay | Admitting: Physical Medicine & Rehabilitation

## 2013-05-11 ENCOUNTER — Ambulatory Visit (HOSPITAL_BASED_OUTPATIENT_CLINIC_OR_DEPARTMENT_OTHER): Admitting: Physical Medicine & Rehabilitation

## 2013-05-11 VITALS — BP 119/69 | HR 89 | Resp 14 | Ht 63.0 in | Wt 236.0 lb

## 2013-05-11 DIAGNOSIS — M47817 Spondylosis without myelopathy or radiculopathy, lumbosacral region: Secondary | ICD-10-CM | POA: Insufficient documentation

## 2013-05-11 DIAGNOSIS — M129 Arthropathy, unspecified: Secondary | ICD-10-CM | POA: Insufficient documentation

## 2013-05-11 DIAGNOSIS — C50419 Malignant neoplasm of upper-outer quadrant of unspecified female breast: Secondary | ICD-10-CM

## 2013-05-11 DIAGNOSIS — G609 Hereditary and idiopathic neuropathy, unspecified: Secondary | ICD-10-CM | POA: Insufficient documentation

## 2013-05-11 DIAGNOSIS — R52 Pain, unspecified: Secondary | ICD-10-CM | POA: Insufficient documentation

## 2013-05-11 DIAGNOSIS — C50919 Malignant neoplasm of unspecified site of unspecified female breast: Secondary | ICD-10-CM | POA: Insufficient documentation

## 2013-05-11 MED ORDER — TRAMADOL HCL 50 MG PO TABS: ORAL_TABLET | ORAL | Status: DC

## 2013-05-11 MED ORDER — PREGABALIN 100 MG PO CAPS: 100.0000 mg | ORAL_CAPSULE | Freq: Three times a day (TID) | ORAL | Status: DC

## 2013-05-11 MED ORDER — NORTRIPTYLINE HCL 10 MG PO CAPS: 10.0000 mg | ORAL_CAPSULE | Freq: Every day | ORAL | Status: DC

## 2013-05-11 MED ORDER — METHOCARBAMOL 500 MG PO TABS: 500.0000 mg | ORAL_TABLET | Freq: Four times a day (QID) | ORAL | Status: DC

## 2013-05-11 NOTE — Patient Instructions (Signed)
Please stop muscle relaxer methocarbamol if you become drowsy  Encourage exercise aquatics

## 2013-05-11 NOTE — Progress Notes (Signed)
Subjective:    Patient ID: Kelsey Wilson, female    DOB: 05/03/1961, 52 y.o.   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 Postoperative radiation therapy, postoperative chemotherapy. Currently on Arimidex  Pain around site of port removal right chest  Aching all over body spasms in legs. Patient states that she has had this even before her cancer diagnosis. Worsening since cancer diagnosis. Cannot definitely tell whether or not it started right after starting on anastrozole    Has pain in both hands and both feet as well as all over body aches. Has seen and orthopedic surgeon in regards to left wrist considering surgery  Pain Inventory Average Pain 9 Pain Right Now 9 My pain is dull and aching  In the last 24 hours, has pain interfered with the following? General activity 7 Relation with others 8 Enjoyment of life 7 What TIME of day is your pain at its worst? day and evening Sleep (in general) Fair  Pain is worse with: walking, bending, sitting, standing and some activites Pain improves with: rest Relief from Meds: 1  Mobility use a cane ability to climb steps?  yes do you drive?  yes Do you have any goals in this area?  yes  Function disabled: date disabled . I need assistance with the following:  meal prep, household duties and shopping  Neuro/Psych bladder control problems bowel control problems loss of taste or smell suicidal thoughts-no plan  Prior Studies Any changes since last visit?  no  Physicians involved in your care Any changes since last visit?  no   Family History  Problem Relation Age of Onset  . Cancer Paternal Grandmother     unknown  . Hypertension Mother   . Diabetes Mother   . Hypertension Father   . Diabetes Father    History   Social History  . Marital Status: Single    Spouse Name: N/A    Number of  Children: N/A  . Years of Education: N/A   Social History Main Topics  . Smoking status: Never Smoker   . Smokeless tobacco: Never Used  . Alcohol Use: No  . Drug Use: No  . Sexual Activity: No   Other Topics Concern  . None   Social History Narrative  . None   Past Surgical History  Procedure Laterality Date  . Cholecystectomy    . Appendectomy    . Carpal tunnel release      Bilateral  . Tonsillectomy    . Spur      Apex spur on both big toes  . Knee surgery      Left Knee  . Portacath placement  06/18/2011    Procedure: INSERTION PORT-A-CATH;  Surgeon: Mariella Saa, MD;  Location: [facility];  Service: General;  Laterality: Right;  right subclavian  . Abdominal hysterectomy      still has ovaries  . Axillary lymph node dissection  11/28/2011    Procedure: AXILLARY LYMPH NODE DISSECTION;  Surgeon: Mariella Saa, MD;  Location: [facility];  Service: General;  Laterality: Left;  . Breast surgery  2013  . Nasal sinus surgery     Past Medical History  Diagnosis Date  . Night sweats   . Fatigue   . Wears glasses   . SOB (shortness of breath) on exertion     walking and stairts  .  Arthritis   . Hot flashes   . Blood transfusion   . Diverticulitis of colon   . Headache(784.0)   . Anemia   . Breast cancer     T3N1 invasive ductal carcinoma left breast  . Bursitis   . Carpal tunnel syndrome   . Fibromyalgia 08/2012   BP 119/69  Pulse 89  Resp 14  Ht 5\' 3"  (1.6 m)  Wt 236 lb (107.049 kg)  BMI 41.82 kg/m2  SpO2 98%     Review of Systems  Constitutional: Positive for unexpected weight change.  Genitourinary: Positive for difficulty urinating.  Musculoskeletal: Positive for arthralgias and myalgias.  Psychiatric/Behavioral: Positive for suicidal ideas.  All other systems reviewed and are negative.       Objective:   Physical Exam  Nursing note and vitals reviewed.  Constitutional: She appears well-developed and well-nourished.    HENT:  Head: Normocephalic and atraumatic.  Eyes: Conjunctivae and EOM are normal. Pupils are equal, round, and reactive to light.  Neck: Normal range of motion.  Psychiatric: She has a flat mood and affect.   Sensory exam decreased to pinprick and a patchy distribution bilateral hands and feet. Relative sparing of L5 compared to L4 and S1. Sensation does improve above the ankle and wrist.  Motor strength is 4/5 bilateral deltoid, bicep, tricep, grip, hip flexors, knee extensors 3 minus ankle dorsiflexor plantar flexor and toe flexor extensor. Some of this is pain limited.         Assessment & Plan:  #1. 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 She's been tried on gabapentin in the past which was not typically helpful. She is also tried on Lyrica higher doses which resulted in side effects.  She is taking tramadol 50 mg 3 times a day however I think she can tolerate nortriptyline 10 mg each bedtime without causing excessive serotonin elevation.  I recommend discontinuing Robaxin 500 mg 3 times a day  Some of her daytime sedation may be related to Xanax which she takes 0.5 mg twice a day as well  2.  Muscle aches- on arimidex, she has fairly diffuse symptoms. I told her not to stop a room and ask a less instructed by oncology.

## 2013-05-20 ENCOUNTER — Other Ambulatory Visit: Payer: Self-pay | Admitting: Oncology

## 2013-05-20 ENCOUNTER — Other Ambulatory Visit: Payer: Self-pay

## 2013-05-20 DIAGNOSIS — Z853 Personal history of malignant neoplasm of breast: Secondary | ICD-10-CM

## 2013-05-25 ENCOUNTER — Ambulatory Visit
Admission: RE | Admit: 2013-05-25 | Discharge: 2013-05-25 | Disposition: A | Discharge status: Home / Self Care | Source: Ambulatory Visit | Attending: Oncology | Admitting: Oncology

## 2013-05-25 ENCOUNTER — Ambulatory Visit

## 2013-05-25 ENCOUNTER — Other Ambulatory Visit: Payer: Self-pay | Admitting: Oncology

## 2013-05-25 DIAGNOSIS — N63 Unspecified lump in unspecified breast: Secondary | ICD-10-CM

## 2013-05-25 DIAGNOSIS — Z853 Personal history of malignant neoplasm of breast: Secondary | ICD-10-CM

## 2013-05-29 ENCOUNTER — Ambulatory Visit
Admission: RE | Admit: 2013-05-29 | Discharge: 2013-05-29 | Disposition: A | Discharge status: Home / Self Care | Source: Ambulatory Visit | Attending: Oncology | Admitting: Oncology

## 2013-05-29 ENCOUNTER — Other Ambulatory Visit: Payer: Self-pay | Admitting: Oncology

## 2013-05-29 DIAGNOSIS — N63 Unspecified lump in unspecified breast: Secondary | ICD-10-CM

## 2013-06-17 ENCOUNTER — Other Ambulatory Visit: Payer: Self-pay

## 2013-06-17 ENCOUNTER — Telehealth: Payer: Self-pay

## 2013-06-17 MED ORDER — TRAMADOL HCL 50 MG PO TABS: ORAL_TABLET | ORAL | Status: DC

## 2013-06-17 NOTE — Telephone Encounter (Signed)
Called in patient's Tramadol refill. EPIC showed that the RX had been phoned in on 05/11/13 with 5 add'l refills and the pharmacy said it was actually a hard copy with no add'l refills. I phoned it Tramadol 50 mg tablets with 4 add'l refills and d/c the order that was placed on 05/11/13 that said it was phoned in.

## 2013-06-22 ENCOUNTER — Ambulatory Visit: Admitting: Physical Medicine and Rehabilitation

## 2013-07-02 ENCOUNTER — Encounter (INDEPENDENT_AMBULATORY_CARE_PROVIDER_SITE_OTHER): Payer: Self-pay | Admitting: General Surgery

## 2013-07-02 ENCOUNTER — Ambulatory Visit (INDEPENDENT_AMBULATORY_CARE_PROVIDER_SITE_OTHER): Admitting: General Surgery

## 2013-07-02 VITALS — BP 126/84 | HR 84 | Temp 98.9°F | Resp 15 | Ht 63.0 in | Wt 243.6 lb

## 2013-07-02 DIAGNOSIS — C50412 Malignant neoplasm of upper-outer quadrant of left female breast: Secondary | ICD-10-CM

## 2013-07-02 DIAGNOSIS — R0789 Other chest pain: Secondary | ICD-10-CM

## 2013-07-02 DIAGNOSIS — C50419 Malignant neoplasm of upper-outer quadrant of unspecified female breast: Secondary | ICD-10-CM

## 2013-07-02 DIAGNOSIS — R071 Chest pain on breathing: Secondary | ICD-10-CM

## 2013-07-02 NOTE — Progress Notes (Signed)
Chief complaint: Followup breast cancer  History: Patient returns for more long-term followup status post left breast lumpectomy and axillary dissection following neoadjuvant chemotherapy for T3, N1, M0 cancer of the left breast mass status post radiation. Surgery date is 11/29/2011. She has had a lot of problems with chronic pain since surgery. She completed therapy with physical therapy but continues to have neuropathic type of pain in her hands and feet as well as aching leg pain and pain in her incisions in her breast and Port-A-Cath removal site as well.she states however that these areas have gradually improved. She states that she has had a new pain for the last several months which she feels under her left breast deep in her chest wall and radiating up toward her axilla. This is worse with any motion. She feels this is new and different from the previous pain that she was having.. She has occasional minimal swelling in her left arm.  Exam: BP 126/84  Pulse 84  Temp(Src) 98.9 F (37.2 C) (Oral)  Resp 15  Ht 5\' 3"  (1.6 m)  Wt 243 lb 9.6 oz (110.496 kg)  BMI 43.16 kg/m2 General: No distress HEENT: No palpable masses. Sclera nonicteric Lymph nodes: No cervical, subclavicular or inguinal nodes palpable Lungs: Clear equal breath sounds bilaterally Breasts: Moderate postradiation changes on the left. Incisions well healed. No palpable mass or thickening at the lumpectomy site. Right breast is negative to exam.  Assessment and plan: No evidence of recurrent cancer. Pain remains an issue for her although she appears to have a more discrete pain in her left chest wall that she says is new and different in recent months. I do not detect any abnormalities on exam. She is however very concerned and worried about this and it is a definite change for her. Therefore after discussion I decided to order a bone scan to try to rule out any metastatic disease to the rib cage. I will call her with the results.  Assuming this is negative she is scheduled to return in 6 months.

## 2013-07-03 ENCOUNTER — Telehealth (INDEPENDENT_AMBULATORY_CARE_PROVIDER_SITE_OTHER): Payer: Self-pay | Admitting: *Deleted

## 2013-07-03 NOTE — Telephone Encounter (Signed)
LMOM for pt to return my call.  I was calling pt to inform her of the appt for her bone scan at [facility] on 2/6 with an arrival time of 11:45am.  This is a 2 part test with the first part being an injection and then she can leave and return roughly around 2:00pm for part 2 to have the actual scan done.

## 2013-07-03 NOTE — Telephone Encounter (Signed)
Pt returned my call.  I provided her with all instructions below.  She is agreeable with this appt.

## 2013-07-10 ENCOUNTER — Encounter (HOSPITAL_COMMUNITY)
Admission: RE | Admit: 2013-07-10 | Discharge: 2013-07-10 | Disposition: A | Discharge status: Home / Self Care | Source: Ambulatory Visit | Attending: General Surgery | Admitting: General Surgery

## 2013-07-10 DIAGNOSIS — C50919 Malignant neoplasm of unspecified site of unspecified female breast: Secondary | ICD-10-CM | POA: Insufficient documentation

## 2013-07-10 DIAGNOSIS — R079 Chest pain, unspecified: Secondary | ICD-10-CM | POA: Insufficient documentation

## 2013-07-10 DIAGNOSIS — C50412 Malignant neoplasm of upper-outer quadrant of left female breast: Secondary | ICD-10-CM

## 2013-07-10 DIAGNOSIS — R0789 Other chest pain: Secondary | ICD-10-CM

## 2013-07-10 MED ORDER — TECHNETIUM TC 99M MEDRONATE IV KIT
25.0000 | PACK | Freq: Once | INTRAVENOUS | Status: AC | PRN
  Administered 2013-07-10: 25 via INTRAVENOUS

## 2013-07-13 ENCOUNTER — Telehealth (INDEPENDENT_AMBULATORY_CARE_PROVIDER_SITE_OTHER): Payer: Self-pay | Admitting: General Surgery

## 2013-07-13 NOTE — Telephone Encounter (Signed)
Call the patient and discussed the results of bone scan. Has a very subtle area of uptake in the sternum which is not her area of pain and I told her that I thought this was very low probability metastatic disease.

## 2013-07-29 ENCOUNTER — Telehealth: Payer: Self-pay

## 2013-07-29 NOTE — Telephone Encounter (Signed)
Patient called requesting a refill on Meloxicam. Meloxicam was D/C at 11/7 OV, and Robaxin was D/C at 12/8 OV. Patient has appt on 3/2 and she is going to speak with Dr. Letta Pate regarding this.

## 2013-08-03 ENCOUNTER — Ambulatory Visit (HOSPITAL_BASED_OUTPATIENT_CLINIC_OR_DEPARTMENT_OTHER): Admitting: Physical Medicine & Rehabilitation

## 2013-08-03 ENCOUNTER — Encounter: Payer: Self-pay | Admitting: Physical Medicine & Rehabilitation

## 2013-08-03 ENCOUNTER — Encounter: Attending: Physical Medicine and Rehabilitation

## 2013-08-03 ENCOUNTER — Other Ambulatory Visit: Payer: Self-pay

## 2013-08-03 VITALS — BP 129/72 | HR 79 | Resp 14 | Ht 63.0 in | Wt 248.0 lb

## 2013-08-03 DIAGNOSIS — G609 Hereditary and idiopathic neuropathy, unspecified: Secondary | ICD-10-CM | POA: Insufficient documentation

## 2013-08-03 DIAGNOSIS — G622 Polyneuropathy due to other toxic agents: Secondary | ICD-10-CM

## 2013-08-03 DIAGNOSIS — M797 Fibromyalgia: Secondary | ICD-10-CM

## 2013-08-03 DIAGNOSIS — M47817 Spondylosis without myelopathy or radiculopathy, lumbosacral region: Secondary | ICD-10-CM | POA: Insufficient documentation

## 2013-08-03 DIAGNOSIS — C50919 Malignant neoplasm of unspecified site of unspecified female breast: Secondary | ICD-10-CM | POA: Insufficient documentation

## 2013-08-03 DIAGNOSIS — IMO0002 Reserved for concepts with insufficient information to code with codable children: Secondary | ICD-10-CM

## 2013-08-03 DIAGNOSIS — IMO0001 Reserved for inherently not codable concepts without codable children: Secondary | ICD-10-CM

## 2013-08-03 DIAGNOSIS — M129 Arthropathy, unspecified: Secondary | ICD-10-CM | POA: Insufficient documentation

## 2013-08-03 DIAGNOSIS — R52 Pain, unspecified: Secondary | ICD-10-CM | POA: Insufficient documentation

## 2013-08-03 MED ORDER — PREGABALIN 100 MG PO CAPS: 100.0000 mg | ORAL_CAPSULE | Freq: Three times a day (TID) | ORAL | Status: DC

## 2013-08-03 NOTE — Progress Notes (Signed)
Subjective:    Patient ID: Kelsey Wilson, female    DOB: [birth year], 53 y.o.   MRN: [id number]  HPI Patient using diclofenac gel on an as-needed basis. We discussed that it is most effective 4 times per day  Will not continue on meloxicam secondary to risk for kidney problems or ulcers  Still ses hand surgeon  but Right  CTS release not helpful  Hands and feet painful from neuropathy, partial relief with lyrica, no improvement with nortriptyline or gabapentin Still taking tramadol with partial relief Pain Inventory Average Pain 9 Pain Right Now 9 My pain is dull  In the last 24 hours, has pain interfered with the following? General activity 7 Relation with others 7 Enjoyment of life 7 What TIME of day is your pain at its worst? daytime Sleep (in general) Fair  Pain is worse with: walking, bending, sitting, standing and some activites Pain improves with: rest Relief from Meds: 2  Mobility walk without assistance use a cane ability to climb steps?  yes do you drive?  yes transfers alone Do you have any goals in this area?  yes  Function disabled: date disabled na I need assistance with the following:  bathing, meal prep, household duties and shopping  Neuro/Psych numbness tingling trouble walking spasms depression anxiety  Prior Studies Any changes since last visit?  yes bone scan  Physicians involved in your care Any changes since last visit?  no   Family History  Problem Relation Age of Onset  . Cancer Paternal Grandmother     unknown  . Hypertension Mother   . Diabetes Mother   . Hypertension Father   . Diabetes Father    History   Social History  . Marital Status: Single    Spouse Name: N/A    Number of Children: N/A  . Years of Education: N/A   Social History Main Topics  . Smoking status: Never Smoker   . Smokeless tobacco: Never Used  . Alcohol Use: No  . Drug Use: No  . Sexual Activity: No   Other Topics Concern  . None    Social History Narrative  . None   Past Surgical History  Procedure Laterality Date  . Cholecystectomy    . Appendectomy    . Carpal tunnel release      Bilateral  . Tonsillectomy    . Spur      Apex spur on both big toes  . Knee surgery      Left Knee  . Portacath placement  06/18/2011    Procedure: INSERTION PORT-A-CATH;  Surgeon: Edward Jolly, MD;  Location: [facility];  Service: General;  Laterality: Right;  right subclavian  . Abdominal hysterectomy      still has ovaries  . Axillary lymph node dissection  11/28/2011    Procedure: AXILLARY LYMPH NODE DISSECTION;  Surgeon: Edward Jolly, MD;  Location: [facility];  Service: General;  Laterality: Left;  . Breast surgery  2013  . Nasal sinus surgery     Past Medical History  Diagnosis Date  . Night sweats   . Fatigue   . Wears glasses   . SOB (shortness of breath) on exertion     walking and stairts  . Arthritis   . Hot flashes   . Blood transfusion   . Diverticulitis of colon   . Headache(784.0)   . Anemia   . Breast cancer     T3N1 invasive ductal carcinoma left breast  .  Bursitis   . Carpal tunnel syndrome   . Fibromyalgia 08/2012   BP 129/72  Pulse 79  Resp 14  Ht 5\' 3"  (1.6 m)  Wt 248 lb (112.492 kg)  BMI 43.94 kg/m2  SpO2 98%  Opioid Risk Score:   Fall Risk Score: Low Fall Risk (0-5 points) (pt educated on fall risk, brochure given to pt.)    Review of Systems  Constitutional: Positive for diaphoresis and unexpected weight change.  Respiratory: Positive for shortness of breath.   Cardiovascular: Positive for leg swelling.  Musculoskeletal: Positive for gait problem.  Neurological: Positive for numbness.       Tingling, spasms  Psychiatric/Behavioral: Positive for dysphoric mood. The patient is nervous/anxious.   All other systems reviewed and are negative.       Objective:   Physical Exam  Vitals reviewed. Constitutional: She is oriented to person, place, and  time. She appears well-developed and well-nourished.  HENT:  Head: Normocephalic and atraumatic.  Eyes: Conjunctivae and EOM are normal. Pupils are equal, round, and reactive to light.  Neurological: She is alert and oriented to person, place, and time. She has normal reflexes.  Psychiatric: She has a normal mood and affect.   Patient with 4 minus/5 strength bilateral deltoid, bicep, tricep, grip, hip flexor, knee extensors, ankle dorsiflexor plantar flexor limited by pain. 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 She's been tried on gabapentin in the past which was not typically helpful. She is also tried on Lyrica higher doses which resulted in side effects.  Continue Lyrica 100 mg 3 times a day She is taking tramadol 50 mg 3 times a day Discontinue nortriptyline  Robaxin 500 mg 3 times a day prn  Podiatry referral for toenail care given history of peripheral neuropathy  2. Widespread body pain difficult to say how much is related to arimidex verses fibromyalgia continue treatment as above. Recommend fibromyalgia aquatic therapy program at the [facility] patient plans to start this up again

## 2013-08-03 NOTE — Patient Instructions (Signed)
We will not refill the meloxicam since long-term use can cause kidney failure or ulcers

## 2013-08-19 ENCOUNTER — Other Ambulatory Visit: Payer: Self-pay | Admitting: Physician Assistant

## 2013-08-19 ENCOUNTER — Telehealth: Payer: Self-pay | Admitting: Oncology

## 2013-08-19 NOTE — Telephone Encounter (Signed)
S/w the pt and she is aware of her appt d/t's on 09/17/2013.

## 2013-08-28 ENCOUNTER — Other Ambulatory Visit: Payer: Self-pay | Admitting: *Deleted

## 2013-08-28 ENCOUNTER — Other Ambulatory Visit: Payer: Self-pay | Admitting: Oncology

## 2013-09-01 ENCOUNTER — Other Ambulatory Visit: Payer: Self-pay | Admitting: Oncology

## 2013-09-01 ENCOUNTER — Telehealth: Payer: Self-pay | Admitting: Oncology

## 2013-09-01 DIAGNOSIS — C50919 Malignant neoplasm of unspecified site of unspecified female breast: Secondary | ICD-10-CM

## 2013-09-01 NOTE — Telephone Encounter (Signed)
APRIL APPTS CXD - S/W PT RE NEW APPTS FOR 8/12 AND 8/19.

## 2013-09-08 ENCOUNTER — Ambulatory Visit (INDEPENDENT_AMBULATORY_CARE_PROVIDER_SITE_OTHER): Admitting: Podiatry

## 2013-09-08 ENCOUNTER — Encounter: Payer: Self-pay | Admitting: Podiatry

## 2013-09-08 VITALS — BP 110/75 | HR 78 | Resp 16 | Ht 63.0 in | Wt 246.0 lb

## 2013-09-08 DIAGNOSIS — M79609 Pain in unspecified limb: Secondary | ICD-10-CM

## 2013-09-08 DIAGNOSIS — M722 Plantar fascial fibromatosis: Secondary | ICD-10-CM

## 2013-09-08 DIAGNOSIS — B351 Tinea unguium: Secondary | ICD-10-CM

## 2013-09-08 DIAGNOSIS — G629 Polyneuropathy, unspecified: Secondary | ICD-10-CM

## 2013-09-08 DIAGNOSIS — G589 Mononeuropathy, unspecified: Secondary | ICD-10-CM

## 2013-09-08 NOTE — Progress Notes (Signed)
   Subjective:    Patient ID: Kelsey Wilson, female    DOB: 19-Dec-1960, 53 y.o.   MRN: [id number]  HPI Comments: i have neurpothy in my feet, i need to have my toenails taking care of , cant feel feet, toenails are thick     Review of Systems  Constitutional: Positive for activity change, fatigue and unexpected weight change.  Respiratory: Positive for shortness of breath.   Cardiovascular: Positive for leg swelling.  Genitourinary: Positive for frequency.  Musculoskeletal: Positive for back pain.       Joint pain , difficulty walking   Skin:       Change in nails   Neurological: Positive for weakness, numbness and headaches.  Hematological: Bruises/bleeds easily.  Psychiatric/Behavioral: The patient is nervous/anxious.   All other systems reviewed and are negative.       Objective:   Physical Exam: I have reviewed her past medical history medications allergies surgeries social history and review of systems. Vital signs are stable she is alert and oriented x3 pulses are strongly palpable bilateral. Neurologic sensorium is deficient to the level of the midfoot bilateral. Deep tendon reflexes are in non-elicitable. Muscle strength + over 5 dorsiflexors plantar flexors inverters everters all his musculature is intact orthopedic evaluation demonstrates all joints distal to the ankle a full range of motion without crepitation. She has severe pain on palpation medial continued tubercles bilateral. Radiographs evaluation does not demonstrate any changes in soft tissue or and osseous structure. However cutaneous evaluation demonstrates supple well hydrated cutis thick yellow dystrophic clinically mycotic nails.        Assessment & Plan:  Assessment: Chemotherapy induced peripheral neuropathy. Plantar fasciitis bilateral. Pain in limb secondary to onychomycosis 1 through 5 bilateral.  Plan: Debridement of nails 1 through 5 bilateral. Injected bilateral heels today with Kenalog and local  anesthetic we'll followup with her in 3 months.

## 2013-09-17 ENCOUNTER — Ambulatory Visit: Admitting: Physician Assistant

## 2013-09-17 ENCOUNTER — Ambulatory Visit

## 2013-09-17 ENCOUNTER — Other Ambulatory Visit

## 2013-10-29 ENCOUNTER — Other Ambulatory Visit: Payer: Self-pay | Admitting: Physical Medicine & Rehabilitation

## 2013-10-29 NOTE — Telephone Encounter (Signed)
Dena please change Ms Smiths provider on her September appt to [facility] from [facility]. thx

## 2013-10-30 ENCOUNTER — Telehealth: Payer: Self-pay | Admitting: *Deleted

## 2013-10-30 NOTE — Telephone Encounter (Signed)
Calling to say my heels are giving me pain when I get up or even try to walk on them.  Is there something else I can do?  I been doing what the doctor suggested.  I returned her call.  I asked her to purchase some over the counter inserts.  She stated she has already been wearing some that she got through [facility].  She said maybe they are worn out.  I asked if she could tolerate Advil or Ibuprofen.  She stated she already takes a lot of pain medicines because she has Cancer and Fibromyalgia.  She said she takes Lyrica and Tramadol.  She said she'll just continue to take that and see how it goes and maybe get some new inserts.  I advised her to make sure she is wearing good supportive shoes, no flip flops or going barefoot.  She stated she can't wear flip flops.  I advised her to call an schedule an appointment with Dr. Milinda Pointer  if she continues to have problems.

## 2013-12-08 ENCOUNTER — Ambulatory Visit (INDEPENDENT_AMBULATORY_CARE_PROVIDER_SITE_OTHER): Admitting: Podiatry

## 2013-12-08 ENCOUNTER — Encounter: Payer: Self-pay | Admitting: Podiatry

## 2013-12-08 VITALS — BP 118/76 | HR 83 | Resp 16

## 2013-12-08 DIAGNOSIS — M79609 Pain in unspecified limb: Secondary | ICD-10-CM

## 2013-12-08 DIAGNOSIS — B351 Tinea unguium: Secondary | ICD-10-CM

## 2013-12-08 DIAGNOSIS — M79676 Pain in unspecified toe(s): Secondary | ICD-10-CM

## 2013-12-08 NOTE — Progress Notes (Signed)
She presents today with a chief complaint of painful elongated toenails. Nails are thick yellow dystrophic with mycotic and painful palpation.  Assessment: Pain in limb secondary to onychomycosis.  Plan: Debridement of nails 1 through 5 bilateral.

## 2014-01-07 ENCOUNTER — Encounter (INDEPENDENT_AMBULATORY_CARE_PROVIDER_SITE_OTHER): Payer: Self-pay | Admitting: General Surgery

## 2014-01-07 ENCOUNTER — Ambulatory Visit (INDEPENDENT_AMBULATORY_CARE_PROVIDER_SITE_OTHER): Admitting: General Surgery

## 2014-01-07 VITALS — BP 126/72 | HR 71 | Temp 97.2°F | Ht 63.0 in | Wt 248.0 lb

## 2014-01-07 DIAGNOSIS — C50419 Malignant neoplasm of upper-outer quadrant of unspecified female breast: Secondary | ICD-10-CM

## 2014-01-07 DIAGNOSIS — C50412 Malignant neoplasm of upper-outer quadrant of left female breast: Secondary | ICD-10-CM

## 2014-01-07 NOTE — Progress Notes (Signed)
Chief complaint: Followup breast cancer  History: Patient returns for more long-term followup status post left breast lumpectomy and axillary dissection following neoadjuvant chemotherapy for T3, N1, M0 cancer of the left breast mass status post radiation. Surgery date is 11/29/2011. She has had a lot of problems with chronic pain since surgery. She completed therapy with physical therapy but continues to have neuropathic type of pain in her hands and feet as well as aching leg pain and pain in her incisions in her breast and Port-A-Cath removal site as well.She is on Lyrica and receiving therapy. The pain is about the same. She does notice some swelling in her left arm which she feels is a little bit worse in recent months. She denies breast lumps or skin changes or nipple discharge.  Exam: BP 126/72  Pulse 71  Temp(Src) 97.2 F (36.2 C)  Ht 5\' 3"  (1.6 m)  Wt 248 lb (112.492 kg)  BMI 43.94 kg/m2 General: No distress HEENT: No palpable masses. Sclera nonicteric Lymph nodes: No cervical, subclavicular or inguinal nodes palpable Lungs: Clear equal breath sounds bilaterally Breasts: My postradiation changes on the left. Incisions well healed. No palpable mass or thickening at the lumpectomy site. Right breast is negative to exam. Extremities: No gross swelling. Measurement however of the mid upper arm is 42 cm on the left and 38 cm on the right.  Assessment and plan: No evidence of recurrent cancer. Pain remains an issue for her And she is receiving therapy. No clinical evidence of cancer on exam. She does have some mild lymphedema which is more symptomatic and we will give her a followup appointment at the lymphedema clinic. She is using a sleeve at home. Mammogram due in December. Return in 6 months.

## 2014-01-07 NOTE — Addendum Note (Signed)
Addended by: Ivor Costa on: 01/07/2014 10:46 AM   Modules accepted: Orders

## 2014-01-12 ENCOUNTER — Telehealth: Payer: Self-pay | Admitting: Oncology

## 2014-01-12 ENCOUNTER — Other Ambulatory Visit: Payer: Self-pay

## 2014-01-12 ENCOUNTER — Other Ambulatory Visit (HOSPITAL_BASED_OUTPATIENT_CLINIC_OR_DEPARTMENT_OTHER)

## 2014-01-12 ENCOUNTER — Ambulatory Visit: Attending: General Surgery | Admitting: Physical Therapy

## 2014-01-12 ENCOUNTER — Other Ambulatory Visit: Payer: Self-pay | Admitting: Physical Medicine & Rehabilitation

## 2014-01-12 DIAGNOSIS — IMO0001 Reserved for inherently not codable concepts without codable children: Secondary | ICD-10-CM | POA: Diagnosis present

## 2014-01-12 DIAGNOSIS — C50419 Malignant neoplasm of upper-outer quadrant of unspecified female breast: Secondary | ICD-10-CM

## 2014-01-12 DIAGNOSIS — G5790 Unspecified mononeuropathy of unspecified lower limb: Secondary | ICD-10-CM | POA: Diagnosis not present

## 2014-01-12 DIAGNOSIS — G5690 Unspecified mononeuropathy of unspecified upper limb: Secondary | ICD-10-CM | POA: Insufficient documentation

## 2014-01-12 DIAGNOSIS — I890 Lymphedema, not elsewhere classified: Secondary | ICD-10-CM | POA: Diagnosis not present

## 2014-01-12 DIAGNOSIS — C50919 Malignant neoplasm of unspecified site of unspecified female breast: Secondary | ICD-10-CM

## 2014-01-12 DIAGNOSIS — M24519 Contracture, unspecified shoulder: Secondary | ICD-10-CM | POA: Insufficient documentation

## 2014-01-12 LAB — CBC & DIFF AND RETIC
BASO%: 0.3 % (ref 0.0–2.0)
Basophils Absolute: 0 10*3/uL (ref 0.0–0.1)
EOS%: 3.9 % (ref 0.0–7.0)
Eosinophils Absolute: 0.2 10*3/uL (ref 0.0–0.5)
HCT: 36.3 % (ref 34.8–46.6)
HGB: 12.1 g/dL (ref 11.6–15.9)
Immature Retic Fract: 2.2 % (ref 1.60–10.00)
LYMPH%: 50.4 % — ABNORMAL HIGH (ref 14.0–49.7)
MCH: 28.9 pg (ref 25.1–34.0)
MCHC: 33.3 g/dL (ref 31.5–36.0)
MCV: 86.6 fL (ref 79.5–101.0)
MONO#: 0.3 10*3/uL (ref 0.1–0.9)
MONO%: 7.3 % (ref 0.0–14.0)
NEUT#: 1.5 10*3/uL (ref 1.5–6.5)
NEUT%: 38.1 % — ABNORMAL LOW (ref 38.4–76.8)
Platelets: 174 10*3/uL (ref 145–400)
RBC: 4.19 10*6/uL (ref 3.70–5.45)
RDW: 13.5 % (ref 11.2–14.5)
Retic %: 0.87 % (ref 0.70–2.10)
Retic Ct Abs: 36.45 10*3/uL (ref 33.70–90.70)
WBC: 3.9 10*3/uL (ref 3.9–10.3)
lymph#: 1.9 10*3/uL (ref 0.9–3.3)

## 2014-01-12 LAB — COMPREHENSIVE METABOLIC PANEL (CC13)
ALT: 10 U/L (ref 0–55)
AST: 18 U/L (ref 5–34)
Albumin: 3.7 g/dL (ref 3.5–5.0)
Alkaline Phosphatase: 62 U/L (ref 40–150)
Anion Gap: 11 mEq/L (ref 3–11)
BUN: 21.9 mg/dL (ref 7.0–26.0)
CO2: 28 mEq/L (ref 22–29)
Calcium: 9.5 mg/dL (ref 8.4–10.4)
Chloride: 104 mEq/L (ref 98–109)
Creatinine: 1 mg/dL (ref 0.6–1.1)
Glucose: 105 mg/dl (ref 70–140)
Potassium: 3.6 mEq/L (ref 3.5–5.1)
Sodium: 143 mEq/L (ref 136–145)
Total Bilirubin: 0.69 mg/dL (ref 0.20–1.20)
Total Protein: 6.6 g/dL (ref 6.4–8.3)

## 2014-01-12 MED ORDER — TRAMADOL HCL 50 MG PO TABS: ORAL_TABLET | ORAL | Status: DC

## 2014-01-12 NOTE — Telephone Encounter (Signed)
per pt needed to r/s appt-adv GM not avail until Oct-pt agreed to see HF

## 2014-01-13 ENCOUNTER — Other Ambulatory Visit

## 2014-01-18 ENCOUNTER — Ambulatory Visit: Admitting: Physical Therapy

## 2014-01-18 DIAGNOSIS — IMO0001 Reserved for inherently not codable concepts without codable children: Secondary | ICD-10-CM | POA: Diagnosis not present

## 2014-01-19 ENCOUNTER — Telehealth: Payer: Self-pay | Admitting: Nurse Practitioner

## 2014-01-19 ENCOUNTER — Encounter: Payer: Self-pay | Admitting: Nurse Practitioner

## 2014-01-19 ENCOUNTER — Ambulatory Visit (HOSPITAL_BASED_OUTPATIENT_CLINIC_OR_DEPARTMENT_OTHER): Admitting: Nurse Practitioner

## 2014-01-19 VITALS — BP 123/78 | HR 74 | Temp 98.2°F | Resp 20 | Ht 63.0 in | Wt 249.1 lb

## 2014-01-19 DIAGNOSIS — Z170 Estrogen receptor positive status [ER+]: Secondary | ICD-10-CM

## 2014-01-19 DIAGNOSIS — N951 Menopausal and female climacteric states: Secondary | ICD-10-CM

## 2014-01-19 DIAGNOSIS — C50412 Malignant neoplasm of upper-outer quadrant of left female breast: Secondary | ICD-10-CM

## 2014-01-19 DIAGNOSIS — R232 Flushing: Secondary | ICD-10-CM

## 2014-01-19 DIAGNOSIS — C50419 Malignant neoplasm of upper-outer quadrant of unspecified female breast: Secondary | ICD-10-CM

## 2014-01-19 DIAGNOSIS — R109 Unspecified abdominal pain: Secondary | ICD-10-CM | POA: Insufficient documentation

## 2014-01-19 MED ORDER — VENLAFAXINE HCL 37.5 MG PO TABS: 37.5000 mg | ORAL_TABLET | Freq: Two times a day (BID) | ORAL | Status: DC

## 2014-01-19 MED ORDER — VENLAFAXINE HCL ER 37.5 MG PO CP24: 37.5000 mg | ORAL_CAPSULE | Freq: Every day | ORAL | Status: DC

## 2014-01-19 NOTE — Telephone Encounter (Signed)
per pof to sch pt appt-sch & gave pt copy of appt

## 2014-01-19 NOTE — Progress Notes (Signed)
[facility]  [phone] Fax:([phone]) [phone]  OFFICE PROGRESS NOTE    ID: Kelsey Wilson   DOB: [birth year]  MR#: [id number]  LZJ#:[id number]  PCP: Kelsey Drafts., FNP GYN:  SU: Kelsey Seltzer, MD OTHER MD: Kelsey Wilson,  Kelsey Sato, MD, Kelsey Penna mD  CHIEF COMPLAINT: left breast cancer CURRENT THERAPY: anastrozole 2m daily  BREAST CANCER HISTORY: From Dr. RJulien Wilson 06/13/2011:  "The patient first noticed a lump in the upper outer left breast about mid November. She had been having regular mammograms. She consulted her physician and was referred to the breast center for further workup. Bilateral mammogram was performed which revealed an irregular mass in the upper outer quadrant of the left breast at the 2:00 position measuring approximately 3 cm. A clearly enlarged left axillary lymph node was also seen. 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 Ki-67 was 100%. Subsequent bilateral breast MRI was performed. This reveals abnormal enhancement in the area of the mass measuring 2.9 x 5.2 cm. Again noted was an approximately 3 cm axillary lymph node. No other areas were detected."  Her subsequent history is as detailed below.  INTERVAL HISTORY: Kelsey Wilson today for follow up of her breast cancer. She has been on anastrozole since November 2013. Her main complaint is hot flashes. She has them everyday, multiple times a day and they interfere with her activity. She denies any vaginal changes. Kelsey Wilson her hands full with several grandchildren to tend to at home. [facility] She is being seen regularly at the lymphedema clinic, where she is getting her left arm wrapped now.   REVIEW OF SYSTEMS: Kelsey Wilson several chronic issues. She suffers  from fibromyalgia and hurts all over constantly. Her pain is managed by a local main management clinic and she is satisfied with her care there. She has occasional headaches. She gets short of breath with exertion but this is not new and not associate with any chest tightness or palpitations.She has voltaren gel that she applies to her thighs prescribed to her because her legs have had painful muscle spasms lately. Kelsey Wilson also been having intense abdominal pain. It does not cramp, and she has difficulty categorizing the pain. Sometimes she thinks its related to a bowel movement sensation, but nothing happens. She is concerned about this and states that her PCP is following up with pancreatic labs soon. A detailed review of systems was otherwise noncontributory.   PAST MEDICAL HISTORY: Past Medical History  Diagnosis Date  . Night sweats   . Fatigue   . Wears glasses   . SOB (shortness of breath) on exertion     walking and stairts  . Arthritis   . Hot flashes   . Blood transfusion   . Diverticulitis of colon   . Headache(784.0)   . Anemia   . Breast cancer     T3N1 invasive ductal carcinoma left breast  . Bursitis   . Carpal tunnel syndrome   . Fibromyalgia 08/2012    PAST SURGICAL HISTORY: Past Surgical History  Procedure Laterality Date  . Cholecystectomy    . Appendectomy    . Carpal tunnel release      Bilateral  . Tonsillectomy    . Spur      Apex spur on both big toes  . Knee  surgery      Left Knee  . Portacath placement  06/18/2011    Procedure: INSERTION PORT-A-CATH;  Surgeon: Edward Jolly, MD;  Location: [facility];  Service: General;  Laterality: Right;  right subclavian  . Abdominal hysterectomy      still has ovaries  . Axillary lymph node dissection  11/28/2011    Procedure: AXILLARY LYMPH NODE DISSECTION;  Surgeon: Edward Jolly, MD;  Location: [facility];  Service: General;  Laterality: Left;  . Breast surgery  2013  . Nasal sinus  surgery      FAMILY HISTORY Family History  Problem Relation Age of Onset  . Cancer Paternal Grandmother     unknown  . Hypertension Mother   . Diabetes Mother   . Hypertension Father   . Diabetes Father   Her father, 73 years old is the minister at [employer] she attends. Her mother is 76. The patient has 3 brothers and 3 sisters. There is no history of breast or ovarian cancer in the immediate family.  GYNECOLOGIC HISTORY: Menarche age 53, first live birth age 70, [name]. She underwent hysterectomy without salpingo-oophorectomy in 2000. She never took hormone replacement.   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 She is divorced. At home she lives with her son's  2 children, aged 82 and 45. Her daughter Kelsey Wilson and her 3 children used to live with her but have moved out. Her son Kelsey Wilson works as a Administrator. The patient has 4 additional grandchildren. She attends a [employer].    ADVANCED DIRECTIVES: Not in place  HEALTH MAINTENANCE: History  Substance Use Topics  . Smoking status: Never Smoker   . Smokeless tobacco: Never Used  . Alcohol Use: No     Colonoscopy: Not on file  PAP: Not on file   Bone density: Bone density scan on 04/09/2012 showed a T score of 1.2 (normal).  Lipid panel: Not on file  Allergies  Allergen Reactions  . Compazine Other (See Comments)    Numbness of face and lips  . Aleve [Naproxen] Nausea Only  . Oxycodone     hallucinations  . Penicillins Nausea Only    Current Outpatient Prescriptions  Medication Sig Dispense Refill  . acetaminophen (TYLENOL) 500 MG tablet Take 500 mg by mouth as needed for pain.      Marland Kitchen ALPRAZolam (XANAX) 0.5 MG tablet Take 0.5 mg by mouth 2 (two) times daily as needed for sleep.      Marland Kitchen anastrozole (ARIMIDEX) 1 MG tablet TAKE 1 TABLET BY MOUTH ONCE DAILY  30 tablet  11  . Cholecalciferol (VITAMIN D) 2000 UNITS CAPS Take 1 capsule by mouth daily.       . diclofenac sodium (VOLTAREN) 1 % GEL Apply 2 g topically 4 (four) times daily.  2 Tube  2  . LYRICA 100 MG capsule TAKE 1 CAPSULE BY MOUTH THREE TIMES A DAY  90 capsule  4  . methocarbamol (ROBAXIN) 500 MG tablet Take 1 tablet (500 mg total) by mouth 4 (four) times daily.  100 tablet  2  . NONFORMULARY OR COMPOUNDED ITEM Ketoconazole 2% cream.  Apply to feet daily.      . traMADol (ULTRAM) 50 MG tablet TAKE 1 TABLET BY MOUTH EVERY 6 HOURS AS NEEDED FOR PAIN, must last 30 days.  90 tablet  5  . triamterene-hydrochlorothiazide (MAXZIDE-25) 37.5-25 MG per tablet Take 1 tablet by  mouth daily.      Marland Kitchen UNABLE TO FIND Rx: E0814- Non-Silicone Breast Prosthesis (Quantity: 1) Dx: 174.9; Left Breast Lumpectomy  1 each  0  . GARCINIA CAMBOGIA-CHROMIUM PO Take by mouth.      . loratadine (CLARITIN) 10 MG tablet Take 10 mg by mouth daily.      Marland Kitchen venlafaxine (EFFEXOR) 37.5 MG tablet Take 1 tablet (37.5 mg total) by mouth 2 (two) times daily.  30 tablet  2   No current facility-administered medications for this visit.    OBJECTIVE: Middle-aged [place] American woman who appears stated age 53 Vitals:   01/19/14 1434  BP: 123/78  Pulse: 74  Temp: 98.2 F (36.8 C)  Resp: 20     Body mass index is 44.14 kg/(m^2).    ECOG FS: 2  Skin: warm, dry  HEENT: sclerae anicteric, conjunctivae pink, oropharynx clear. No thrush or mucositis.  Lymph Nodes: No cervical or supraclavicular lymphadenopathy  Lungs: clear to auscultation bilaterally, no rales, wheezes, or rhonci  Heart: regular rate and rhythm  Abdomen: round, soft, tender in mid left quadrant, no rebound tenderness, guarding, positive bowel sounds  Musculoskeletal: No focal spinal tenderness, no peripheral edema  Neuro: non focal, well oriented, positive affect  Breasts: left breast status post lumpectomy and radiation. No evidence of local recurrence. Left axilla benign. Right breast unremarkable.   LAB RESULTS: Lab Results  Component Value  Date   WBC 3.9 01/12/2014   NEUTROABS 1.5 01/12/2014   HGB 12.1 01/12/2014   HCT 36.3 01/12/2014   MCV 86.6 01/12/2014   PLT 174 01/12/2014      Chemistry      Component Value Date/Time   NA 143 01/12/2014 1448   NA 135 02/11/2012 1450   K 3.6 01/12/2014 1448   K 3.9 02/11/2012 1450   CL 101 09/18/2012 0853   CL 101 02/11/2012 1450   CO2 28 01/12/2014 1448   CO2 26 02/11/2012 1450   BUN 21.9 01/12/2014 1448   BUN 13 02/11/2012 1450   CREATININE 1.0 01/12/2014 1448   CREATININE 0.53 02/11/2012 1450      Component Value Date/Time   CALCIUM 9.5 01/12/2014 1448   CALCIUM 9.0 02/11/2012 1450   ALKPHOS 62 01/12/2014 1448   ALKPHOS 60 11/08/2011 1138   AST 18 01/12/2014 1448   AST 17 11/08/2011 1138   ALT 10 01/12/2014 1448   ALT 9 11/08/2011 1138   BILITOT 0.69 01/12/2014 1448   BILITOT 1.1 11/08/2011 1138      Lab Results  Component Value Date   LABCA2 41* 06/13/2011    Urinalysis    Component Value Date/Time   COLORURINE YELLOW 02/11/2012 1343   APPEARANCEUR CLEAR 02/11/2012 1343   LABSPEC 1.010 02/11/2012 1343   PHURINE 6.5 02/11/2012 1343   GLUCOSEU NEGATIVE 02/11/2012 1343   HGBUR NEGATIVE 02/11/2012 1343   [place] 02/11/2012 1343   KETONESUR NEGATIVE 02/11/2012 1343   PROTEINUR NEGATIVE 02/11/2012 1343   UROBILINOGEN 0.2 02/11/2012 1343   NITRITE NEGATIVE 02/11/2012 1343   LEUKOCYTESUR NEGATIVE 02/11/2012 1343    STUDIES: No results found. Repeat mammography scheduled at the breast center December 2014   ASSESSMENT: 53 y.o. [place], [place] woman:  (1) Status post left breast biopsy 06/01/2011 for a cT2 pN1 , stage IIB invasive ductal carcinoma, grade 3, estrogen receptor 80% and progesterone receptor 9% positive, with no HER-2 amplification and an MIB-1 of 100%  (2) Treated neoadjuvantly with 4 cycles of cyclophosphamide, epirubicin and fluorouracil,  followed by 4 cycles of docetaxel, completed 11/01/2011  (3) Status post left lumpectomy and axillary lymph node resection 11/28/2011,  showing a complete pathologic response (no residual invasive or in situ cancer in the breast and 0 of 17 lymph nodes involved)  (4) Adjuvant radiation therapy completed 03/31/2012  (5) Started anastrozole November 2013; normal bone density scan 04/09/2012  (6) chronic Left upper extremity lymphedema  PLAN:  Beckey is doing well as far as her breast cancer is concerned. The labs were reviewed in detail with her and were stable. We talked about her options for medications to reduce the hot flashes. We decided venlafaxine XR was the best option for her and we discussed dosing and side effects such as nightmares and headaches. For her abdominal pain she fears it is the return of cancer, so we agreed to order an abdominal/pelvic CT in addition to the labs her PCP is drawing. We will call her with the results of these.  Tonyia is due for a mammogram in December. She will see Dr. Jana Hakim in February. At this time they will discuss switching between anastrozole and tamoxifen if deemed necessary. Gardenia understands and agrees with this plan. She has been encouraged to call with any issues that arise before her next visit here   Marcelino Duster, NP   01/19/2014   4:39 PM

## 2014-01-19 NOTE — Telephone Encounter (Signed)
gave pt contrast and adv that CS will call to sch-pt understood

## 2014-01-20 ENCOUNTER — Ambulatory Visit (HOSPITAL_BASED_OUTPATIENT_CLINIC_OR_DEPARTMENT_OTHER): Admitting: Oncology

## 2014-01-20 ENCOUNTER — Ambulatory Visit: Admitting: Physical Therapy

## 2014-01-20 ENCOUNTER — Ambulatory Visit: Admitting: Nurse Practitioner

## 2014-01-20 DIAGNOSIS — IMO0001 Reserved for inherently not codable concepts without codable children: Secondary | ICD-10-CM | POA: Diagnosis not present

## 2014-01-21 ENCOUNTER — Encounter (HOSPITAL_COMMUNITY): Payer: Self-pay

## 2014-01-21 ENCOUNTER — Ambulatory Visit (HOSPITAL_COMMUNITY)
Admission: RE | Admit: 2014-01-21 | Discharge: 2014-01-21 | Disposition: A | Discharge status: Home / Self Care | Source: Ambulatory Visit | Attending: Nurse Practitioner | Admitting: Nurse Practitioner

## 2014-01-21 DIAGNOSIS — Q619 Cystic kidney disease, unspecified: Secondary | ICD-10-CM | POA: Diagnosis not present

## 2014-01-21 DIAGNOSIS — IMO0002 Reserved for concepts with insufficient information to code with codable children: Secondary | ICD-10-CM | POA: Diagnosis not present

## 2014-01-21 DIAGNOSIS — R109 Unspecified abdominal pain: Secondary | ICD-10-CM | POA: Diagnosis present

## 2014-01-21 DIAGNOSIS — M169 Osteoarthritis of hip, unspecified: Secondary | ICD-10-CM | POA: Diagnosis not present

## 2014-01-21 DIAGNOSIS — M1610 Unilateral primary osteoarthritis, unspecified hip: Secondary | ICD-10-CM | POA: Insufficient documentation

## 2014-01-21 DIAGNOSIS — K449 Diaphragmatic hernia without obstruction or gangrene: Secondary | ICD-10-CM | POA: Insufficient documentation

## 2014-01-21 IMAGING — CT CT ABD-PELV W/ CM
2 of 4 series · 17 of 46 positions shown, 19 images · IV contrast (OMNIPAQUE)
Comparison: 08/16/2011

CLINICAL DATA: Severe abdominal pain in the epigastric and
left-sided region for 3 months. History of breast carcinoma.

EXAM:
CT ABDOMEN AND PELVIS WITH CONTRAST
TECHNIQUE: Multidetector CT imaging of the abdomen and pelvis was performed
using the standard protocol following bolus administration of
intravenous contrast.
CONTRAST:  100mL OMNIPAQUE IOHEXOL 300 MG/ML  SOLN

[Series 2: rtn a/p with · axial · 0.74mm/px · z∈[-491,-71]mm · 14 of 92 slices shown, 16 images]
[im 4/92  soft-tissue]
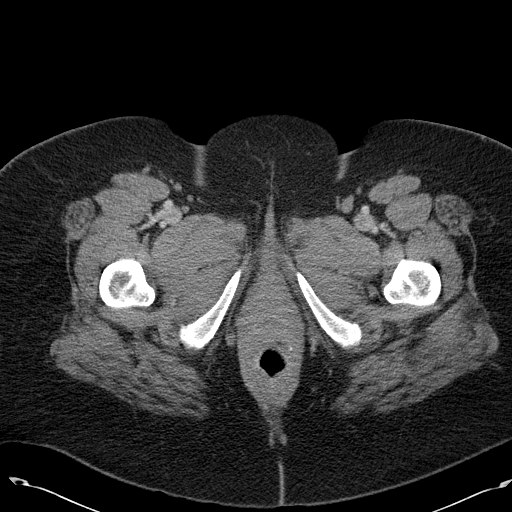
[im 4/92  bone]
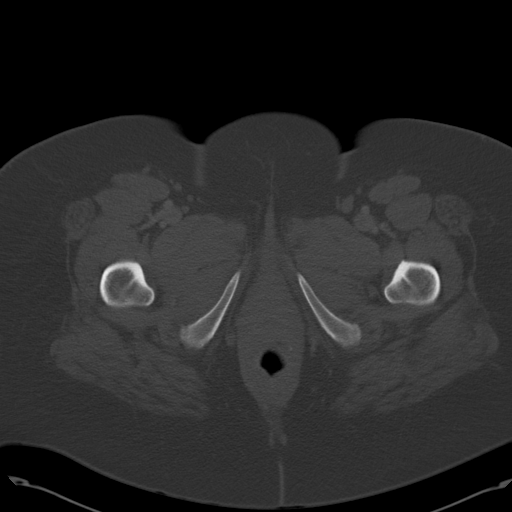
[im 12/92  soft-tissue]
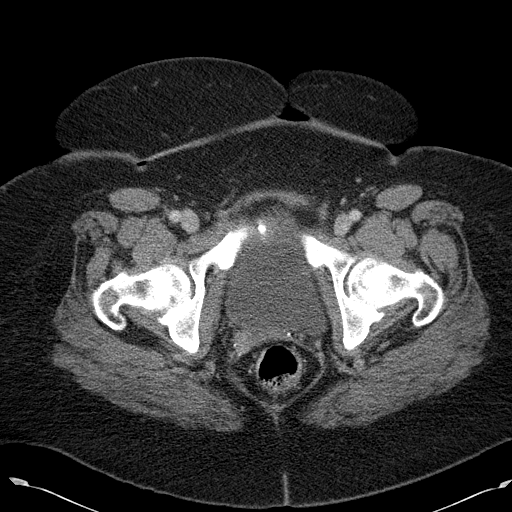
[im 19/92  soft-tissue]
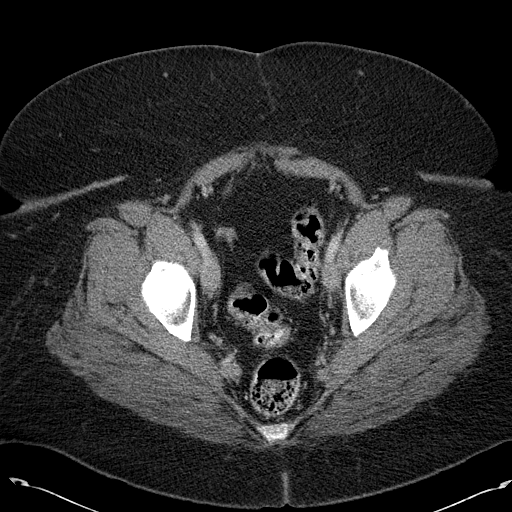
[im 23/92  soft-tissue]
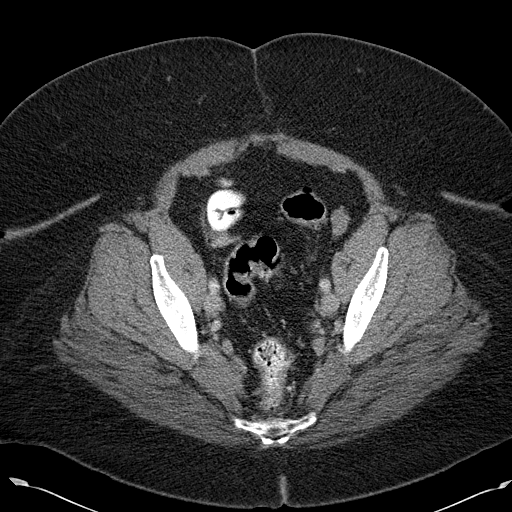
[im 31/92  soft-tissue]
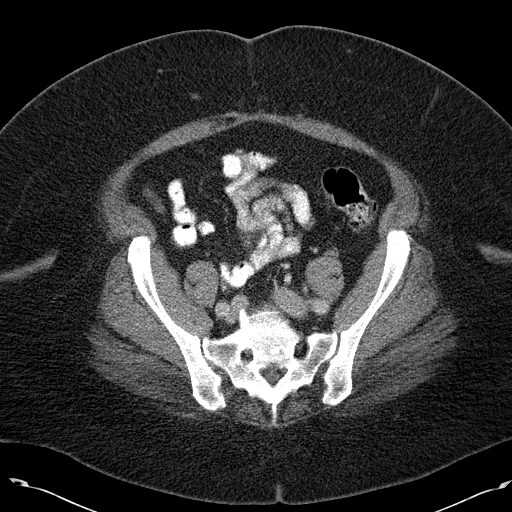
[im 38/92  soft-tissue]
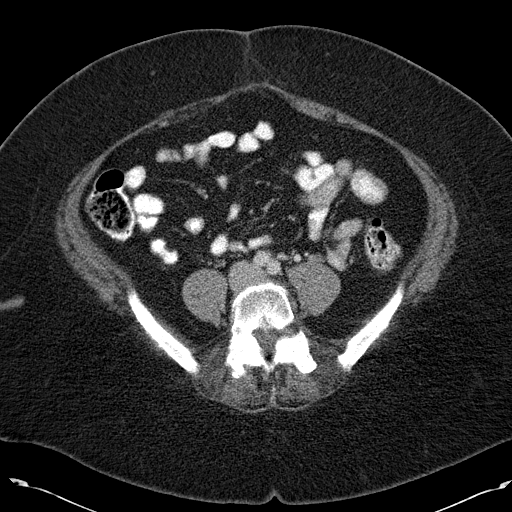
[im 42/92  soft-tissue]
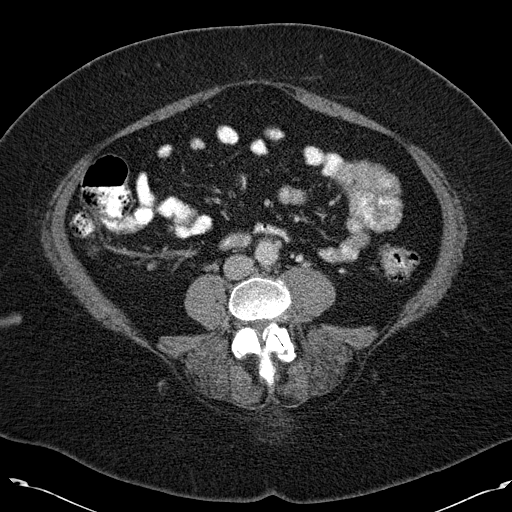
[im 50/92  soft-tissue]
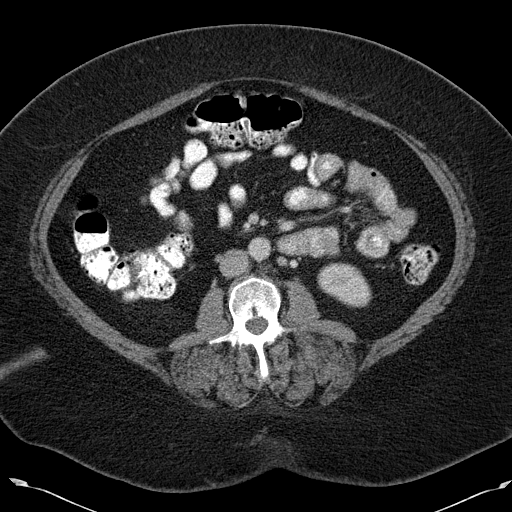
[im 54/92  soft-tissue]
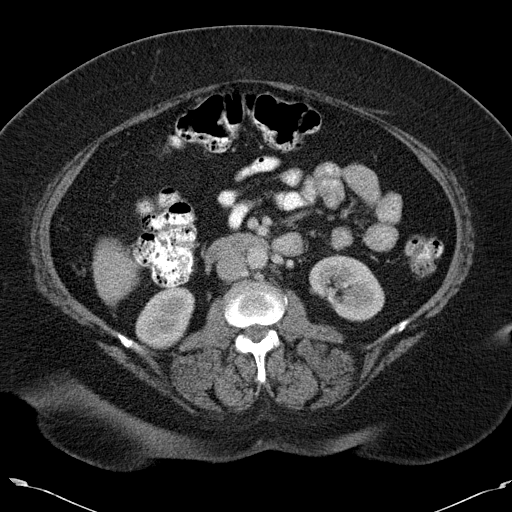
[im 54/92  bone]
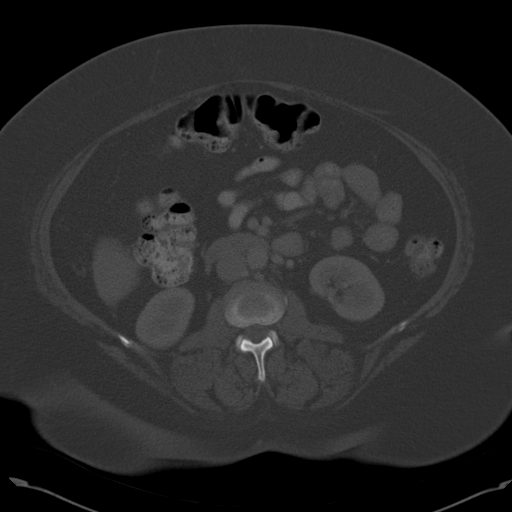
[im 61/92  soft-tissue]
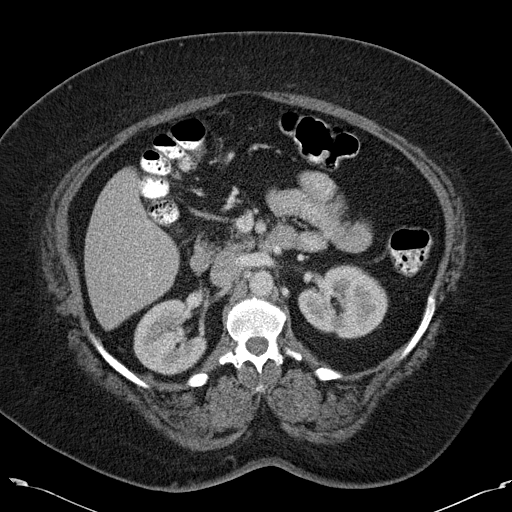
[im 69/92  soft-tissue]
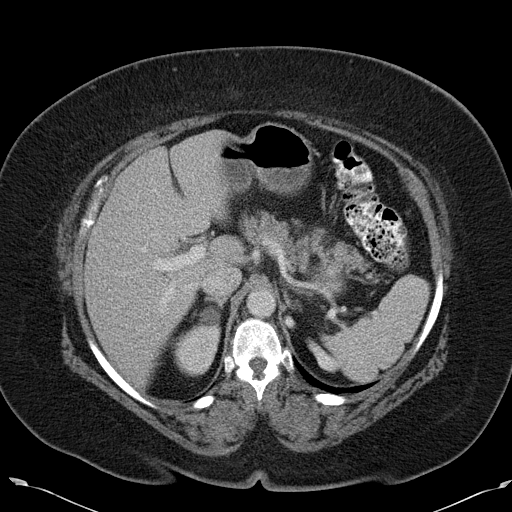
[im 73/92  soft-tissue]
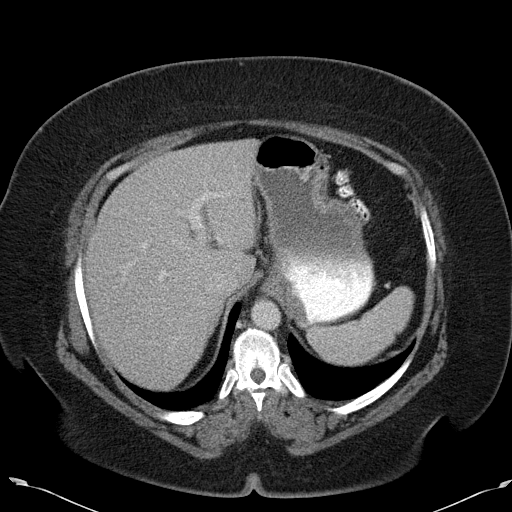
[im 80/92  soft-tissue]
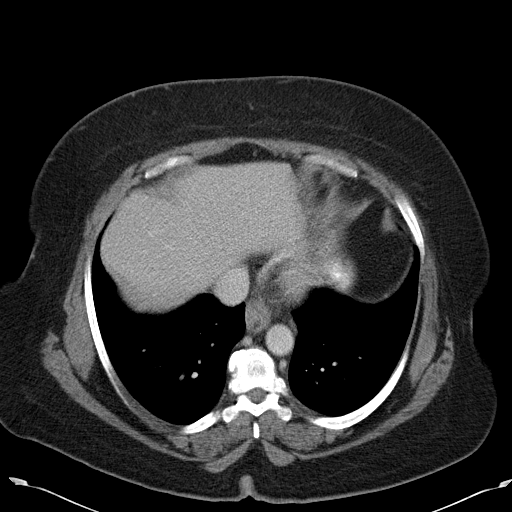
[im 88/92  soft-tissue]
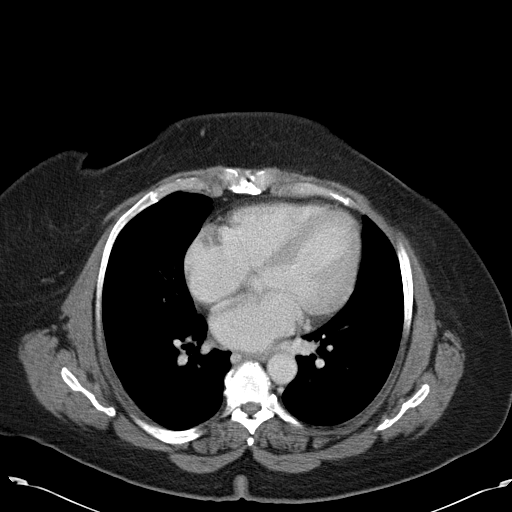

[Series 602: <mpr thick range> · coronal · 0.90mm/px · 3 of 100 slices shown]
[im 34/100  soft-tissue]
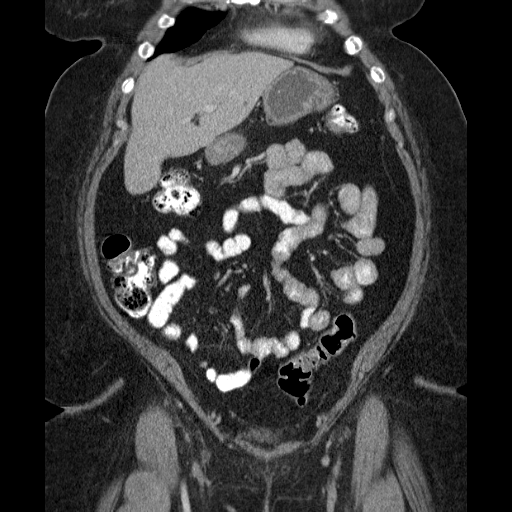
[im 45/100  soft-tissue]
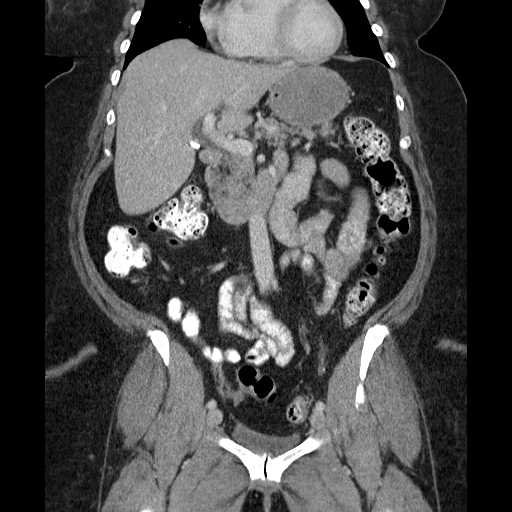
[im 56/100  soft-tissue]
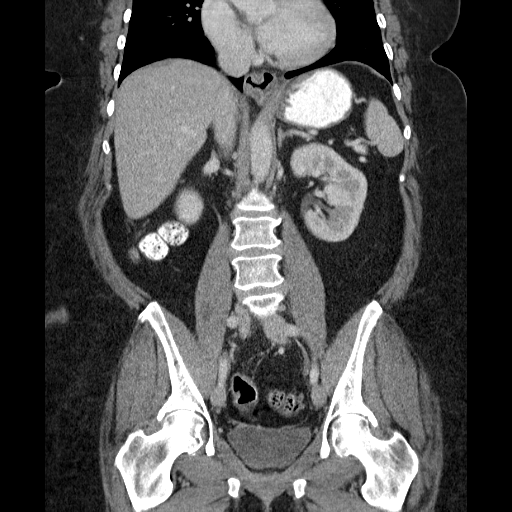

[17 of 46 positions shown; findings below may reference images not displayed]

FINDINGS: Small hiatal hernia.  Clear lung bases.

Liver, spleen and pancreas: Unremarkable. Gallbladder surgically
absent. No bile duct dilation. No adrenal masses.

19 mm upper pole right renal cyst. No other renal masses, no stones
and no hydronephrosis. Normal ureters normal bladder.

Uterus is surgically absent.  No pelvic masses

No adenopathy.  There are no abnormal fluid collections.

Normal bowel.  Appendix has been removed.

Mild degenerative changes of the lower thoracic spine, minimal
degenerative changes of the lower lumbar spine and mild bilateral
hip joint arthropathic change. No osteoblastic or osteolytic
lesions.
IMPRESSION: 1. No acute findings.
2. Small hiatal hernia.
3. Status post cholecystectomy and hysterectomy.
4. Small renal cyst and mild degenerative changes of the spine and
hips. No other abnormalities.

## 2014-01-21 MED ORDER — IOHEXOL 300 MG/ML  SOLN
100.0000 mL | Freq: Once | INTRAMUSCULAR | Status: AC | PRN
  Administered 2014-01-21: 100 mL via INTRAVENOUS

## 2014-01-25 ENCOUNTER — Ambulatory Visit: Admitting: Physical Therapy

## 2014-01-25 DIAGNOSIS — IMO0001 Reserved for inherently not codable concepts without codable children: Secondary | ICD-10-CM | POA: Diagnosis not present

## 2014-01-27 ENCOUNTER — Ambulatory Visit: Admitting: Physical Therapy

## 2014-01-27 DIAGNOSIS — IMO0001 Reserved for inherently not codable concepts without codable children: Secondary | ICD-10-CM | POA: Diagnosis not present

## 2014-01-29 ENCOUNTER — Encounter: Admitting: Physical Therapy

## 2014-02-01 ENCOUNTER — Encounter: Admitting: Physical Therapy

## 2014-02-03 ENCOUNTER — Ambulatory Visit: Attending: General Surgery | Admitting: Physical Therapy

## 2014-02-03 DIAGNOSIS — G5790 Unspecified mononeuropathy of unspecified lower limb: Secondary | ICD-10-CM | POA: Diagnosis not present

## 2014-02-03 DIAGNOSIS — M24519 Contracture, unspecified shoulder: Secondary | ICD-10-CM | POA: Insufficient documentation

## 2014-02-03 DIAGNOSIS — G5690 Unspecified mononeuropathy of unspecified upper limb: Secondary | ICD-10-CM | POA: Insufficient documentation

## 2014-02-03 DIAGNOSIS — I890 Lymphedema, not elsewhere classified: Secondary | ICD-10-CM | POA: Insufficient documentation

## 2014-02-03 DIAGNOSIS — IMO0001 Reserved for inherently not codable concepts without codable children: Secondary | ICD-10-CM | POA: Insufficient documentation

## 2014-02-04 ENCOUNTER — Encounter: Attending: Registered Nurse | Admitting: Registered Nurse

## 2014-02-04 ENCOUNTER — Encounter: Payer: Self-pay | Admitting: Registered Nurse

## 2014-02-04 ENCOUNTER — Encounter: Admitting: Physical Medicine and Rehabilitation

## 2014-02-04 VITALS — BP 137/83 | HR 74 | Resp 16 | Ht 63.0 in | Wt 252.0 lb

## 2014-02-04 DIAGNOSIS — G622 Polyneuropathy due to other toxic agents: Secondary | ICD-10-CM | POA: Diagnosis not present

## 2014-02-04 DIAGNOSIS — IMO0002 Reserved for concepts with insufficient information to code with codable children: Secondary | ICD-10-CM

## 2014-02-04 DIAGNOSIS — IMO0001 Reserved for inherently not codable concepts without codable children: Secondary | ICD-10-CM | POA: Diagnosis not present

## 2014-02-04 DIAGNOSIS — M797 Fibromyalgia: Secondary | ICD-10-CM

## 2014-02-04 NOTE — Progress Notes (Signed)
Subjective:    Patient ID: Kelsey Wilson, female    DOB: [birth year], 53 y.o.   MRN: [id number]  HPI: Kelsey Wilson is a 53 year old female who returns for follow up for chronic pain and medication refill. She says her pain is located in her bilateral hands, legs and feet. She rates her pain 8. Her current exercise regime is going to physical therapy twice a week.  Pain Inventory Average Pain 8 Pain Right Now 8 My pain is tingling  In the last 24 hours, has pain interfered with the following? General activity 8 Relation with others 9 Enjoyment of life 9 What TIME of day is your pain at its worst? daytime Sleep (in general) Fair  Pain is worse with: walking, bending and standing Pain improves with: rest and medication Relief from Meds: 8  Mobility do you drive?  yes Do you have any goals in this area?  yes  Function I need assistance with the following:  dressing, bathing, meal prep and household duties  Neuro/Psych numbness trouble walking spasms depression anxiety  Prior Studies Any changes since last visit?  yes CT/MRI [place]  Physicians involved in your care Any changes since last visit?  no   Family History  Problem Relation Age of Onset  . Cancer Paternal Grandmother     unknown  . Hypertension Mother   . Diabetes Mother   . Hypertension Father   . Diabetes Father    History   Social History  . Marital Status: Single    Spouse Name: N/A    Number of Children: N/A  . Years of Education: N/A   Social History Main Topics  . Smoking status: Never Smoker   . Smokeless tobacco: Never Used  . Alcohol Use: No  . Drug Use: No  . Sexual Activity: No   Other Topics Concern  . None   Social History Narrative  . None   Past Surgical History  Procedure Laterality Date  . Cholecystectomy    . Appendectomy    . Carpal tunnel release      Bilateral  . Tonsillectomy    . Spur      Apex spur on both big toes  . Knee surgery      Left Knee    . Portacath placement  06/18/2011    Procedure: INSERTION PORT-A-CATH;  Surgeon: Edward Jolly, MD;  Location: [place] [facility];  Service: General;  Laterality: Right;  right subclavian  . Abdominal hysterectomy      still has ovaries  . Axillary lymph node dissection  11/28/2011    Procedure: AXILLARY LYMPH NODE DISSECTION;  Surgeon: Edward Jolly, MD;  Location: [facility];  Service: General;  Laterality: Left;  . Breast surgery  2013  . Nasal sinus surgery     Past Medical History  Diagnosis Date  . Night sweats   . Fatigue   . Wears glasses   . SOB (shortness of breath) on exertion     walking and stairts  . Arthritis   . Hot flashes   . Blood transfusion   . Diverticulitis of colon   . Headache(784.0)   . Anemia   . Breast cancer     T3N1 invasive ductal carcinoma left breast  . Bursitis   . Carpal tunnel syndrome   . Fibromyalgia 08/2012   BP 137/83  Pulse 74  Resp 16  Ht 5\' 3"  (1.6 m)  Wt 252 lb (114.306 kg)  BMI 44.65 kg/m2  SpO2 98%  Opioid Risk Score:   Fall Risk Score: Moderate Fall Risk (6-13 points) (pt educated, declined handout)    Review of Systems  Constitutional: Positive for diaphoresis and unexpected weight change.  Respiratory: Positive for shortness of breath.   Cardiovascular: Positive for leg swelling.  Gastrointestinal: Positive for abdominal pain.  Musculoskeletal: Positive for back pain.  Neurological: Positive for numbness.       Spasms  Psychiatric/Behavioral: The patient is nervous/anxious.        Depression  All other systems reviewed and are negative.      Objective:   Physical Exam  Nursing note and vitals reviewed. Constitutional: She is oriented to person, place, and time. She appears well-developed and well-nourished.  HENT:  Head: Normocephalic and atraumatic.  Neck: Normal range of motion. Neck supple.  Cardiovascular: Normal rate and regular rhythm.   Pulmonary/Chest: Effort normal.   Musculoskeletal:  Normal Muscle Bulk and Muscle testing Reveals: Upper Extremities: Full ROM and Muscle strength 2/5 Lumbar Paraspinal tenderness: L-3- L-5 Lower Extremities: Full ROM and Muscle strength 5/5 Right Leg Flexion Produces pain into popliteal fossa Arises from chair with ease Narrow based Gait    Neurological: She is alert and oriented to person, place, and time.  Skin: Skin is warm and dry.  Psychiatric: She has a normal mood and affect.          Assessment & Plan:  1. 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 2. Muscle Spasms: No Complaints at this time  15 minutes of face to face patient care time was spent during this visit. All questions were encouraged and answered.  F/U in 6 months

## 2014-02-05 ENCOUNTER — Encounter: Admitting: Physical Therapy

## 2014-02-09 ENCOUNTER — Encounter: Admitting: Physical Therapy

## 2014-02-10 ENCOUNTER — Ambulatory Visit: Admitting: Physical Therapy

## 2014-02-10 DIAGNOSIS — IMO0001 Reserved for inherently not codable concepts without codable children: Secondary | ICD-10-CM | POA: Diagnosis not present

## 2014-02-11 ENCOUNTER — Telehealth: Payer: Self-pay

## 2014-02-11 NOTE — Telephone Encounter (Signed)
Patient states that [facility] does not have the order for her diabetic shoes.

## 2014-02-12 ENCOUNTER — Ambulatory Visit: Admitting: Physical Therapy

## 2014-02-12 NOTE — Telephone Encounter (Signed)
Contacted [facility] to see if they received the order for patient's diabetic shoes. They state they did not receive the order. [facility] is faxing a form to the clinic.

## 2014-02-16 ENCOUNTER — Ambulatory Visit: Admitting: Physical Therapy

## 2014-02-16 DIAGNOSIS — IMO0001 Reserved for inherently not codable concepts without codable children: Secondary | ICD-10-CM | POA: Diagnosis not present

## 2014-02-17 ENCOUNTER — Ambulatory Visit: Admitting: Physical Therapy

## 2014-02-17 DIAGNOSIS — IMO0001 Reserved for inherently not codable concepts without codable children: Secondary | ICD-10-CM | POA: Diagnosis not present

## 2014-02-19 ENCOUNTER — Other Ambulatory Visit: Payer: Self-pay | Admitting: Nurse Practitioner

## 2014-02-19 ENCOUNTER — Other Ambulatory Visit: Payer: Self-pay | Admitting: Emergency Medicine

## 2014-02-19 ENCOUNTER — Other Ambulatory Visit: Payer: Self-pay | Admitting: Oncology

## 2014-02-19 DIAGNOSIS — Z853 Personal history of malignant neoplasm of breast: Secondary | ICD-10-CM

## 2014-02-19 DIAGNOSIS — M8580 Other specified disorders of bone density and structure, unspecified site: Secondary | ICD-10-CM

## 2014-02-23 ENCOUNTER — Ambulatory Visit: Admitting: Physical Therapy

## 2014-02-23 DIAGNOSIS — IMO0001 Reserved for inherently not codable concepts without codable children: Secondary | ICD-10-CM | POA: Diagnosis not present

## 2014-02-25 ENCOUNTER — Ambulatory Visit: Admitting: Physical Therapy

## 2014-02-25 DIAGNOSIS — IMO0001 Reserved for inherently not codable concepts without codable children: Secondary | ICD-10-CM | POA: Diagnosis not present

## 2014-03-02 ENCOUNTER — Ambulatory Visit: Admitting: Physical Therapy

## 2014-03-02 DIAGNOSIS — IMO0001 Reserved for inherently not codable concepts without codable children: Secondary | ICD-10-CM | POA: Diagnosis not present

## 2014-03-04 ENCOUNTER — Encounter: Admitting: Physical Therapy

## 2014-03-09 ENCOUNTER — Ambulatory Visit: Attending: General Surgery | Admitting: Physical Therapy

## 2014-03-09 DIAGNOSIS — M24511 Contracture, right shoulder: Secondary | ICD-10-CM | POA: Diagnosis not present

## 2014-03-09 DIAGNOSIS — Z5189 Encounter for other specified aftercare: Secondary | ICD-10-CM | POA: Insufficient documentation

## 2014-03-09 DIAGNOSIS — G629 Polyneuropathy, unspecified: Secondary | ICD-10-CM | POA: Insufficient documentation

## 2014-03-09 DIAGNOSIS — I890 Lymphedema, not elsewhere classified: Secondary | ICD-10-CM | POA: Diagnosis not present

## 2014-03-09 DIAGNOSIS — M24512 Contracture, left shoulder: Secondary | ICD-10-CM | POA: Insufficient documentation

## 2014-03-11 ENCOUNTER — Ambulatory Visit: Admitting: Physical Therapy

## 2014-03-12 ENCOUNTER — Ambulatory Visit: Admitting: Physical Therapy

## 2014-03-12 ENCOUNTER — Telehealth: Payer: Self-pay | Admitting: *Deleted

## 2014-03-12 MED ORDER — METHOCARBAMOL 500 MG PO TABS: 500.0000 mg | ORAL_TABLET | Freq: Four times a day (QID) | ORAL | Status: DC

## 2014-03-12 NOTE — Telephone Encounter (Signed)
Called for her robaxin refill.  Last appt 02/04/14.  Refilled.

## 2014-03-18 ENCOUNTER — Ambulatory Visit

## 2014-03-18 ENCOUNTER — Ambulatory Visit: Admitting: Podiatry

## 2014-03-23 ENCOUNTER — Ambulatory Visit

## 2014-03-23 ENCOUNTER — Encounter: Payer: Self-pay | Admitting: *Deleted

## 2014-03-23 DIAGNOSIS — Z5189 Encounter for other specified aftercare: Secondary | ICD-10-CM | POA: Diagnosis not present

## 2014-04-01 ENCOUNTER — Ambulatory Visit (INDEPENDENT_AMBULATORY_CARE_PROVIDER_SITE_OTHER): Payer: Medicare Other | Admitting: Podiatry

## 2014-04-01 ENCOUNTER — Encounter: Payer: Self-pay | Admitting: Podiatry

## 2014-04-01 DIAGNOSIS — B351 Tinea unguium: Secondary | ICD-10-CM | POA: Diagnosis not present

## 2014-04-01 DIAGNOSIS — M79676 Pain in unspecified toe(s): Secondary | ICD-10-CM

## 2014-04-01 NOTE — Progress Notes (Signed)
Presents today chief complaint of painful elongated toenails.  Objective: Pulses are palpable bilateral nails are thick, yellow dystrophic onychomycosis and painful palpation.   Assessment: Onychomycosis with pain in limb.  Plan: Treatment of nails in thickness and length as covered service secondary to pain.  

## 2014-04-02 ENCOUNTER — Ambulatory Visit: Admitting: Physical Therapy

## 2014-04-02 DIAGNOSIS — Z5189 Encounter for other specified aftercare: Secondary | ICD-10-CM | POA: Diagnosis not present

## 2014-05-26 ENCOUNTER — Ambulatory Visit
Admission: RE | Admit: 2014-05-26 | Discharge: 2014-05-26 | Disposition: A | Discharge status: Home / Self Care | Payer: Medicare Other | Source: Ambulatory Visit | Attending: Oncology | Admitting: Oncology

## 2014-05-26 ENCOUNTER — Ambulatory Visit
Admission: RE | Admit: 2014-05-26 | Discharge: 2014-05-26 | Disposition: A | Discharge status: Home / Self Care | Payer: Medicare Other | Source: Ambulatory Visit | Attending: Nurse Practitioner | Admitting: Nurse Practitioner

## 2014-05-26 DIAGNOSIS — M8580 Other specified disorders of bone density and structure, unspecified site: Secondary | ICD-10-CM

## 2014-05-26 DIAGNOSIS — Z853 Personal history of malignant neoplasm of breast: Secondary | ICD-10-CM

## 2014-06-10 ENCOUNTER — Ambulatory Visit (HOSPITAL_BASED_OUTPATIENT_CLINIC_OR_DEPARTMENT_OTHER): Payer: Medicare Other | Admitting: Oncology

## 2014-06-10 ENCOUNTER — Other Ambulatory Visit (HOSPITAL_BASED_OUTPATIENT_CLINIC_OR_DEPARTMENT_OTHER): Payer: Medicare Other

## 2014-06-10 ENCOUNTER — Telehealth: Payer: Self-pay | Admitting: Oncology

## 2014-06-10 VITALS — BP 129/76 | HR 75 | Temp 98.6°F | Resp 18 | Ht 63.0 in | Wt 245.0 lb

## 2014-06-10 DIAGNOSIS — M797 Fibromyalgia: Secondary | ICD-10-CM

## 2014-06-10 DIAGNOSIS — R5383 Other fatigue: Secondary | ICD-10-CM | POA: Diagnosis not present

## 2014-06-10 DIAGNOSIS — C50412 Malignant neoplasm of upper-outer quadrant of left female breast: Secondary | ICD-10-CM | POA: Diagnosis present

## 2014-06-10 DIAGNOSIS — M47817 Spondylosis without myelopathy or radiculopathy, lumbosacral region: Secondary | ICD-10-CM

## 2014-06-10 DIAGNOSIS — G622 Polyneuropathy due to other toxic agents: Secondary | ICD-10-CM

## 2014-06-10 DIAGNOSIS — IMO0002 Reserved for concepts with insufficient information to code with codable children: Secondary | ICD-10-CM

## 2014-06-10 DIAGNOSIS — I890 Lymphedema, not elsewhere classified: Secondary | ICD-10-CM

## 2014-06-10 LAB — CBC WITH DIFFERENTIAL/PLATELET
BASO%: 0.7 % (ref 0.0–2.0)
Basophils Absolute: 0 10*3/uL (ref 0.0–0.1)
EOS%: 2.2 % (ref 0.0–7.0)
Eosinophils Absolute: 0.1 10*3/uL (ref 0.0–0.5)
HCT: 38.5 % (ref 34.8–46.6)
HGB: 12.3 g/dL (ref 11.6–15.9)
LYMPH%: 42 % (ref 14.0–49.7)
MCH: 27.6 pg (ref 25.1–34.0)
MCHC: 32 g/dL (ref 31.5–36.0)
MCV: 86.5 fL (ref 79.5–101.0)
MONO#: 0.3 10*3/uL (ref 0.1–0.9)
MONO%: 7.9 % (ref 0.0–14.0)
NEUT#: 2 10*3/uL (ref 1.5–6.5)
NEUT%: 47.2 % (ref 38.4–76.8)
Platelets: 175 10*3/uL (ref 145–400)
RBC: 4.45 10*6/uL (ref 3.70–5.45)
RDW: 13.8 % (ref 11.2–14.5)
WBC: 4.3 10*3/uL (ref 3.9–10.3)
lymph#: 1.8 10*3/uL (ref 0.9–3.3)

## 2014-06-10 LAB — COMPREHENSIVE METABOLIC PANEL (CC13)
ALT: 14 U/L (ref 0–55)
AST: 19 U/L (ref 5–34)
Albumin: 3.6 g/dL (ref 3.5–5.0)
Alkaline Phosphatase: 77 U/L (ref 40–150)
Anion Gap: 10 mEq/L (ref 3–11)
BUN: 17.9 mg/dL (ref 7.0–26.0)
CO2: 31 mEq/L — ABNORMAL HIGH (ref 22–29)
Calcium: 9.5 mg/dL (ref 8.4–10.4)
Chloride: 101 mEq/L (ref 98–109)
Creatinine: 0.8 mg/dL (ref 0.6–1.1)
EGFR: >90 mL/min/{1.73_m2} (ref >90)
Glucose: 93 mg/dl (ref 70–140)
Potassium: 3.3 mEq/L — ABNORMAL LOW (ref 3.5–5.1)
Sodium: 141 mEq/L (ref 136–145)
Total Bilirubin: 0.81 mg/dL (ref 0.20–1.20)
Total Protein: 6.9 g/dL (ref 6.4–8.3)

## 2014-06-10 NOTE — Telephone Encounter (Signed)
, °

## 2014-06-10 NOTE — Progress Notes (Signed)
[facility]  [phone] [phone]  OFFICE PROGRESS NOTE    ID: Kelsey Wilson   DOB: [birth year]  MR#: [id number]  PIR#:[id number]  PCP: Vonna Drafts., FNP GYN:  SU: Excell Seltzer, MD OTHER MD: Earnie Larsson,  Theodis Sato, MD, Alysia Penna mD  CHIEF COMPLAINT: left breast cancer CURRENT THERAPY: anastrozole 46m daily  BREAST CANCER HISTORY: From Dr. RJulien Girtnote 06/13/2011:   "The patient first noticed a lump in the upper outer left breast about mid November. She had been having regular mammograms. She consulted her physician and was referred to the breast center for further workup. Bilateral mammogram was performed which revealed an irregular mass in the upper outer quadrant of the left breast at the 2:00 position measuring approximately 3 cm. A clearly enlarged left axillary lymph node was also seen. 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 Ki-67 was 100%. Subsequent bilateral breast MRI was performed. This reveals abnormal enhancement in the area of the mass measuring 2.9 x 5.2 cm. Again noted was an approximately 3 cm axillary lymph node. No other areas were detected."    Her subsequent history is as detailed below.  INTERVAL HISTORY: Kelsey Wilson today for follow up of her breast cancer. The interval history is generally unremarkable except that she had pneumonia in December, she tells me. She was treated for that and is now feeling better. In addition her Dr. referred her to an orthopedist (she cannot remember her primary care doctor's or the orthopedists name today) and she was told that she will need both knees replaced. From a breast cancer point of view she continues on anastrozole, which she is tolerating well. She does have hot flashes and night sweats and of course it hard to  tell apart her fibromyalgia from the arthralgias and myalgias that can because by aromatase inhibitors but she does not feel her symptoms really got any worse after she started anastrozole.   REVIEW OF SYSTEMS: MMiskfeels extremely fatigued. She hurts everywhere. She describes the pain as throbbing, aching, and cramping. She has blurred vision at times. She is hoarse. She has difficulty swallowing and sometimes she has mouth sores. Her ankles swell. She is short of breath with minimal activity. She is coughing some. She does not bring up any phlegm. She sleeps on 4 pillows. She has back and joint pain "all over". She has difficulty walking. She c/o headaches, numbness, forgetfulness, anxiety, and depression. There has been no nausea or vomiting. A detailed review of systems today was otherwise stable.  PAST MEDICAL HISTORY: Past Medical History  Diagnosis Date  . Night sweats   . Fatigue   . Wears glasses   . SOB (shortness of breath) on exertion     walking and stairts  . Arthritis   . Hot flashes   . Blood transfusion   . Diverticulitis of colon   . Headache(784.0)   . Anemia   . Breast cancer     T3N1 invasive ductal carcinoma left breast  . Bursitis   . Carpal tunnel syndrome   . Fibromyalgia 08/2012    PAST SURGICAL HISTORY: Past Surgical History  Procedure Laterality Date  . Cholecystectomy    . Appendectomy    . Carpal tunnel release      Bilateral  . Tonsillectomy    . Spur      Apex spur  on both big toes  . Knee surgery      Left Knee  . Portacath placement  06/18/2011    Procedure: INSERTION PORT-A-CATH;  Surgeon: Edward Jolly, MD;  Location: [facility];  Service: General;  Laterality: Right;  right subclavian  . Abdominal hysterectomy      still has ovaries  . Axillary lymph node dissection  11/28/2011    Procedure: AXILLARY LYMPH NODE DISSECTION;  Surgeon: Edward Jolly, MD;  Location: [place];  Service: General;  Laterality: Left;   . Breast surgery  2013  . Nasal sinus surgery      FAMILY HISTORY Family History  Problem Relation Age of Onset  . Cancer Paternal Grandmother     unknown  . Hypertension Mother   . Diabetes Mother   . Hypertension Father   . Diabetes Father   Her father, 3 years old is the minister at [employer] she attends. Her mother is 28. The patient has 3 brothers and 3 sisters. There is no history of breast or ovarian cancer in the immediate family.  GYNECOLOGIC HISTORY: Menarche age 31, first live birth age 61, she is [place] P3. She underwent hysterectomy without salpingo-oophorectomy in 2000. She never took hormone replacement.  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 She is divorced. At home she lives with herr daughter Kelsey Wilson and her 4 . Her son Kelsey Wilson works as a Administrator. The patient has 6 additional grandchildren. She attends a [employer].    ADVANCED DIRECTIVES: Not in place  HEALTH MAINTENANCE: History  Substance Use Topics  . Smoking status: Never Smoker   . Smokeless tobacco: Never Used  . Alcohol Use: No     Colonoscopy: Not on file  PAP: Scheduled for January 2016  Bone density: Bone density December 2015 was normal.  Lipid panel: Not on file  Allergies  Allergen Reactions  . Compazine Other (See Comments)    Numbness of face and lips  . Aleve [Naproxen] Nausea Only  . Oxycodone     hallucinations  . Penicillins Nausea Only    Current Outpatient Prescriptions  Medication Sig Dispense Refill  . acetaminophen (TYLENOL) 500 MG tablet Take 500 mg by mouth as needed for pain.    Marland Kitchen ALPRAZolam (XANAX) 0.5 MG tablet Take 0.5 mg by mouth 2 (two) times daily as needed for sleep.    Marland Kitchen anastrozole (ARIMIDEX) 1 MG tablet TAKE 1 TABLET BY MOUTH ONCE DAILY 30 tablet 11  . Cholecalciferol (VITAMIN D) 2000 UNITS CAPS Take 1 capsule by mouth daily.    . diclofenac sodium (VOLTAREN) 1 % GEL Apply 2 g topically 4  (four) times daily. 2 Tube 2  . GARCINIA CAMBOGIA-CHROMIUM PO Take by mouth.    Marland Kitchen LYRICA 100 MG capsule TAKE 1 CAPSULE BY MOUTH THREE TIMES A DAY 90 capsule 4  . methocarbamol (ROBAXIN) 500 MG tablet Take 1 tablet (500 mg total) by mouth 4 (four) times daily. 100 tablet 2  . NONFORMULARY OR COMPOUNDED ITEM Ketoconazole 2% cream.  Apply to feet daily.    . traMADol (ULTRAM) 50 MG tablet TAKE 1 TABLET BY MOUTH EVERY 6 HOURS AS NEEDED FOR PAIN, must last 30 days. 90 tablet 5  . triamterene-hydrochlorothiazide (MAXZIDE-25) 37.5-25 MG per tablet Take 1 tablet by mouth daily.    Marland Kitchen UNABLE TO FIND Rx: Q3335- Non-Silicone Breast Prosthesis (Quantity: 1) Dx: 174.9; Left Breast Lumpectomy 1 each 0  .  venlafaxine XR (EFFEXOR-XR) 37.5 MG 24 hr capsule Take 1 capsule (37.5 mg total) by mouth daily with breakfast. 30 capsule 2   No current facility-administered medications for this visit.    OBJECTIVE: Middle-aged [place] American woman who appears older than stated age 23 Vitals:   06/10/14 1438  BP: 129/76  Pulse: 75  Temp: 98.6 F (37 C)  Resp: 18     Body mass index is 43.41 kg/(m^2).    ECOG FS: 2  Sclerae unicteric, pupils round and equal Oropharynx clear, no thrush or other lesions No cervical or supraclavicular adenopathy Lungs no rales or rhonchi Heart regular rate and rhythm Abd soft, obese, nontender, positive bowel sounds MSK no focal spinal tenderness; grade 1 left upper extremity lymphedema Neuro: nonfocal, well oriented, depressed affect Breasts: The right breast is unremarkable. The left breast is status post lumpectomy and radiation. There is no evidence of local recurrence.   LAB RESULTS: Lab Results  Component Value Date   WBC 4.3 06/10/2014   NEUTROABS 2.0 06/10/2014   HGB 12.3 06/10/2014   HCT 38.5 06/10/2014   MCV 86.5 06/10/2014   PLT 175 06/10/2014      Chemistry      Component Value Date/Time   NA 141 06/10/2014 1416   NA 135 02/11/2012 1450   K 3.3*  06/10/2014 1416   K 3.9 02/11/2012 1450   CL 101 09/18/2012 0853   CL 101 02/11/2012 1450   CO2 31* 06/10/2014 1416   CO2 26 02/11/2012 1450   BUN 17.9 06/10/2014 1416   BUN 13 02/11/2012 1450   CREATININE 0.8 06/10/2014 1416   CREATININE 0.53 02/11/2012 1450      Component Value Date/Time   CALCIUM 9.5 06/10/2014 1416   CALCIUM 9.0 02/11/2012 1450   ALKPHOS 77 06/10/2014 1416   ALKPHOS 60 11/08/2011 1138   AST 19 06/10/2014 1416   AST 17 11/08/2011 1138   ALT 14 06/10/2014 1416   ALT 9 11/08/2011 1138   BILITOT 0.81 06/10/2014 1416   BILITOT 1.1 11/08/2011 1138      Lab Results  Component Value Date   LABCA2 41* 06/13/2011    Urinalysis    Component Value Date/Time   COLORURINE YELLOW 02/11/2012 [place] 02/11/2012 1343   LABSPEC 1.010 02/11/2012 1343   PHURINE 6.5 02/11/2012 1343   GLUCOSEU NEGATIVE 02/11/2012 1343   HGBUR NEGATIVE 02/11/2012 1343   BILIRUBINUR NEGATIVE 02/11/2012 1343   KETONESUR NEGATIVE 02/11/2012 1343   PROTEINUR NEGATIVE 02/11/2012 1343   UROBILINOGEN 0.2 02/11/2012 1343   NITRITE NEGATIVE 02/11/2012 1343   LEUKOCYTESUR NEGATIVE 02/11/2012 1343    STUDIES: Dg Bone Density  05/26/2014   CLINICAL DATA:  Postmenopausal. Prior hysterectomy. The patient reports no calcium supplements. She takes vitamin-D supplements daily. Her REM index treatment.  EXAM: DUAL X-RAY ABSORPTIOMETRY (DXA) FOR BONE MINERAL DENSITY  FINDINGS: AP LUMBAR SPINE L1-L4  Bone Mineral Density (BMD):  1.118 g/cm2  Young Adult T-Score:  0.6  Z-Score:  0.9  Left FEMUR neck  Bone Mineral Density (BMD):  0.891 g/cm2  Young Adult T-Score: 0.4  Z-Score:  0.4  ASSESSMENT: Patient's diagnostic category is normal by WHO Criteria.  FRACTURE RISK: Not increased  FRAX: World Health Organization FRAX assessment of absolute fracture risk is not calculated for this patient because the patient has T-scores in the normal range.  COMPARISON: 04/09/2012:  Lumbar spine: Since the  prior study there has been a decrease in bone mineral density by 5.0%.  Total hip: Since the prior study there has been an increase in bone mineral density by 6.8%.  Effective therapies are available in the form of bisphosphonates, selective estrogen receptor modulators, biologic agents, and hormone replacement therapy (for women). All patients should ensure an adequate intake of dietary calcium (1200 mg daily) and vitamin D (800 IU daily) unless contraindicated.  All treatment decisions require clinical judgment and consideration of individual patient factors, including patient preferences, co-morbidities, previous drug use, risk factors not captured in the FRAX model (e.g., frailty, falls, vitamin D deficiency, increased bone turnover, interval significant decline in bone density) and possible under- or over-estimation of fracture risk by FRAX.  The National Osteoporosis Foundation recommends that FDA-approved medical therapies be considered in postmenopausal women and men age 1 or older with a:  1. Hip or vertebral (clinical or morphometric) fracture.  2. T-score of -2.5 or lower at the spine or hip.  3. Ten-year fracture probability by FRAX of 3% or greater for hip fracture or 20% or greater for major osteoporotic fracture.  People with diagnosed cases of osteoporosis or at high risk for fracture should have regular bone mineral density tests. For patients eligible for Medicare, routine testing is allowed once every 2 years. The testing frequency can be increased to one year for patients who have rapidly progressing disease, those who are receiving or discontinuing medical therapy to restore bone mass, or have additional risk factors.  World Pharmacologist [facility]) Criteria:  Normal: T-scores from +1.0 to -1.0  Low Bone Mass (Osteopenia): T-scores between -1.0 and -2.5  Osteoporosis: T-scores -2.5 and below  Comparison to Reference Population:  T-score is the key measure used in the diagnosis of osteoporosis  and relative risk determination for fracture. It provides a value for bone mass relative to the mean bone mass of a young adult reference population expressed in terms of standard deviation (SD).  Z-score is the age-matched score showing the patient's values compared to a population matched for age, sex, and race. This is also expressed in terms of standard deviation. The patient may have values that compare favorably to the age-matched values and still be at increased risk for fracture.   Electronically Signed   By: Shon Hale M.D.   On: 05/26/2014 12:40   Mm Digital Diagnostic Bilat  05/26/2014   CLINICAL DATA:  The patient has history of malignant lumpectomy of the left breast in 2013. Annual re-evaluation.  EXAM: DIGITAL DIAGNOSTIC  bilateral MAMMOGRAM WITH CAD  COMPARISON:  Previous examinations the most recent of which is dated 05/29/2013.  ACR Breast Density Category b: There are scattered areas of fibroglandular density.  FINDINGS: There are stable scarring changes located superiorly within the left breast related to the patient's lumpectomy. There is no specific evidence for recurrent tumor or developing malignancy within either breast.  Mammographic images were processed with CAD.  IMPRESSION: Stable breast parenchymal pattern. No findings worrisome for recurrent tumor or developing malignancy.  RECOMMENDATION: Bilateral diagnostic mammography in 1 year.  I have discussed the findings and recommendations with the patient. Results were also provided in writing at the conclusion of the visit. If applicable, a reminder letter will be sent to the patient regarding the next appointment.  BI-RADS CATEGORY  1: Negative.   Electronically Signed   By: Luberta Robertson M.D.   On: 05/26/2014 10:51      ASSESSMENT: 54 y.o. [place], [place] woman:  (1) Status post left breast biopsy 06/01/2011 for a cT2 pN1, stage IIB  invasive ductal carcinoma, grade 3, estrogen receptor 80% and progesterone  receptor 9% positive, with no HER-2 amplification and an MIB-1 of 100%  (2) Treated neoadjuvantly with 4 cycles of cyclophosphamide, epirubicin and fluorouracil, followed by 4 cycles of docetaxel, completed 11/01/2011  (3) Status post left lumpectomy and axillary lymph node resection 11/28/2011, showing a complete pathologic response (no residual invasive or in situ cancer in the breast and 0 of 17 lymph nodes involved)  (4) Adjuvant radiation therapy completed 03/31/2012  (5) Started anastrozole November 2013; normal bone density scan 05/26/2014  (6) chronic Left upper extremity lymphedema  (7) fibromyalgia and osteoarthritis with polyarthralgia  PLAN:  Kelsey Wilson is 3 years out from her diagnosis of breast cancer and 2-1/2 years out from her definitive surgery with no evidence of disease activity or recurrence. Because she had a complete pathologic response to neoadjuvant chemotherapy, her prognosis is good.  The plan remains to continue anastrozole to a total of 5 years, which is going to take as to November 2018.  Kelsey Wilson will see [place] again in 6 months, with repeat lab work and physical exam. She knows to call for any problems that may develop before that visit.  Chauncey Cruel, MD   06/10/2014   3:13 PM

## 2014-06-11 NOTE — Addendum Note (Signed)
Addended by: Laureen Abrahams on: 06/11/2014 06:25 PM   Modules accepted: Medications

## 2014-06-14 ENCOUNTER — Other Ambulatory Visit: Payer: Self-pay | Admitting: Physical Medicine & Rehabilitation

## 2014-06-15 NOTE — Telephone Encounter (Signed)
Pt called saying her Lyrica 100 mg had yet to be refilled, I called pt to confirm, she had not rec'd Rx, I called [facility] and submitted a refill request, pt notified

## 2014-07-01 ENCOUNTER — Telehealth: Payer: Self-pay | Admitting: *Deleted

## 2014-07-01 ENCOUNTER — Ambulatory Visit (INDEPENDENT_AMBULATORY_CARE_PROVIDER_SITE_OTHER): Payer: Medicare Other | Admitting: Podiatry

## 2014-07-01 DIAGNOSIS — B351 Tinea unguium: Secondary | ICD-10-CM | POA: Diagnosis not present

## 2014-07-01 DIAGNOSIS — M79673 Pain in unspecified foot: Secondary | ICD-10-CM

## 2014-07-01 NOTE — Telephone Encounter (Signed)
Recd notice from insurance that Methocarbamol will no longer be covered under patients prescription plan.  We can switch her to Tizanidine or baclofen

## 2014-07-01 NOTE — Progress Notes (Signed)
Subjective:     Patient ID: Kelsey Wilson, female   DOB: November 25, 1960, 54 y.o.   MRN: [id number]  HPI patient presents with nail disease 1-5 both feet that are very thick incurvated and impossible for her to cut with pain noted   Review of Systems     Objective:   Physical Exam Neurovascular status unchanged with thick yellow brittle nailbeds 1-5 both feet that are painful in the corners and they are C-shaped an incurvated    Assessment:     Chronic mycotic nail infections 1-5 both feet with pain    Plan:     Debride painful nailbeds 1-5 both feet with no iatrogenic bleeding noted

## 2014-07-26 ENCOUNTER — Other Ambulatory Visit: Payer: Self-pay | Admitting: Physical Medicine & Rehabilitation

## 2014-07-28 ENCOUNTER — Other Ambulatory Visit: Payer: Self-pay | Admitting: *Deleted

## 2014-07-28 MED ORDER — METHOCARBAMOL 500 MG PO TABS: 500.0000 mg | ORAL_TABLET | Freq: Four times a day (QID) | ORAL | Status: DC

## 2014-07-28 NOTE — Telephone Encounter (Signed)
Recd refill request for Methocarbamol 500 mg tabs #100  #RF 1.  Sent in electronically

## 2014-08-02 ENCOUNTER — Ambulatory Visit: Payer: Self-pay | Admitting: Orthopedic Surgery

## 2014-08-02 MED ORDER — ACETAMINOPHEN 10 MG/ML IV SOLN: 1000.0000 mg | Freq: Once | INTRAVENOUS | Status: AC

## 2014-08-02 MED ORDER — DEXAMETHASONE SODIUM PHOSPHATE 4 MG/ML IJ SOLN
4.0000 mg | Freq: Once | INTRAMUSCULAR | Status: AC
  Administered 2015-03-10: 10 mg via INTRAVENOUS

## 2014-08-02 MED ORDER — SODIUM CHLORIDE 0.9 % IV SOLN
4.0000 mg | Freq: Once | INTRAVENOUS | Status: AC
  Administered 2015-03-10: 4 mg via INTRAVENOUS

## 2014-08-03 ENCOUNTER — Telehealth: Payer: Self-pay | Admitting: *Deleted

## 2014-08-03 ENCOUNTER — Other Ambulatory Visit: Payer: Self-pay | Admitting: *Deleted

## 2014-08-03 MED ORDER — TIZANIDINE HCL 4 MG PO TABS: 4.0000 mg | ORAL_TABLET | Freq: Four times a day (QID) | ORAL | Status: DC | PRN

## 2014-08-03 NOTE — Telephone Encounter (Signed)
Insurance will no longer cover patient's Methocarbamol 500 mg tablets. It is no longer covered.  We may appeal or switch patient to tizanidine or baclofen.  Fax # [phone] for coverage exception.

## 2014-08-03 NOTE — Telephone Encounter (Signed)
Please Prescribed Tizanidine

## 2014-08-03 NOTE — Telephone Encounter (Signed)
Sent in RX for Zanaflex

## 2014-08-05 ENCOUNTER — Encounter: Payer: Self-pay | Admitting: Registered Nurse

## 2014-08-05 ENCOUNTER — Encounter: Payer: Medicare Other | Attending: Registered Nurse | Admitting: Registered Nurse

## 2014-08-05 VITALS — BP 114/69 | HR 83 | Resp 14

## 2014-08-05 DIAGNOSIS — M1712 Unilateral primary osteoarthritis, left knee: Secondary | ICD-10-CM

## 2014-08-05 DIAGNOSIS — M47817 Spondylosis without myelopathy or radiculopathy, lumbosacral region: Secondary | ICD-10-CM | POA: Insufficient documentation

## 2014-08-05 DIAGNOSIS — M545 Low back pain, unspecified: Secondary | ICD-10-CM

## 2014-08-05 DIAGNOSIS — IMO0002 Reserved for concepts with insufficient information to code with codable children: Secondary | ICD-10-CM

## 2014-08-05 DIAGNOSIS — M1711 Unilateral primary osteoarthritis, right knee: Secondary | ICD-10-CM

## 2014-08-05 DIAGNOSIS — G622 Polyneuropathy due to other toxic agents: Secondary | ICD-10-CM | POA: Insufficient documentation

## 2014-08-05 DIAGNOSIS — M179 Osteoarthritis of knee, unspecified: Secondary | ICD-10-CM | POA: Diagnosis not present

## 2014-08-05 DIAGNOSIS — M797 Fibromyalgia: Secondary | ICD-10-CM | POA: Insufficient documentation

## 2014-08-05 MED ORDER — TIZANIDINE HCL 4 MG PO TABS
4.0000 mg | ORAL_TABLET | Freq: Three times a day (TID) | ORAL | Status: DC | PRN
Start: 2014-08-05 — End: 2014-12-15

## 2014-08-05 MED ORDER — TRAMADOL HCL 50 MG PO TABS: ORAL_TABLET | ORAL | Status: DC

## 2014-08-05 MED ORDER — DICLOFENAC SODIUM 1 % TD GEL: 2.0000 g | Freq: Four times a day (QID) | TRANSDERMAL | Status: DC

## 2014-08-05 NOTE — Progress Notes (Signed)
Subjective:    Patient ID: Kelsey Wilson, female    DOB: Nov 14, 1960, 54 y.o.   MRN: [id number]  HPI:  HPI: Kelsey Wilson is a 54 year old female who returns for follow up for chronic pain and medication refill. She says her pain is located in her bilateral hands and feet, lower back and bilateral knees right greater than left.. She rates her pain 10. Her current exercise regime is walking. She's having increase intensity of pain in her bilateral knees, she's schedule to have Left Knee replacement on April 7th. Her Tramadol will be increased to Tramadol one tablet every 6 hours as needed for two months. Hopefull will be able to resume usual dose. She verbalizes understanding. Also instructed to call office after discharge from hospital when she is placed on analgesic from orthopedist, she verbalizes understanding.  Pain Inventory Average Pain 7 Pain Right Now 10 My pain is aching  In the last 24 hours, has pain interfered with the following? General activity 7 Relation with others 7 Enjoyment of life 7 What TIME of day is your pain at its worst? night Sleep (in general) Fair  Pain is worse with: walking, bending, standing and some activites Pain improves with: rest and medication Relief from Meds: na  Mobility use a cane ability to climb steps?  yes do you drive?  no  Function disabled: date disabled may I need assistance with the following:  bathing, household duties and shopping  Neuro/Psych weakness numbness trouble walking spasms anxiety  Prior Studies Any changes since last visit?  no  Physicians involved in your care Any changes since last visit?  no   Family History  Problem Relation Age of Onset  . Cancer Paternal Grandmother     unknown  . Hypertension Mother   . Diabetes Mother   . Hypertension Father   . Diabetes Father    History   Social History  . Marital Status: Single    Spouse Name: N/A  . Number of Children: N/A  . Years of  Education: N/A   Social History Main Topics  . Smoking status: Never Smoker   . Smokeless tobacco: Never Used  . Alcohol Use: No  . Drug Use: No  . Sexual Activity: No   Other Topics Concern  . None   Social History Narrative   Past Surgical History  Procedure Laterality Date  . Cholecystectomy    . Appendectomy    . Carpal tunnel release      Bilateral  . Tonsillectomy    . Spur      Apex spur on both big toes  . Knee surgery      Left Knee  . Portacath placement  06/18/2011    Procedure: INSERTION PORT-A-CATH;  Surgeon: Edward Jolly, MD;  Location: [place];  Service: General;  Laterality: Right;  right subclavian  . Abdominal hysterectomy      still has ovaries  . Axillary lymph node dissection  11/28/2011    Procedure: AXILLARY LYMPH NODE DISSECTION;  Surgeon: Edward Jolly, MD;  Location: [place];  Service: General;  Laterality: Left;  . Breast surgery  2013  . Nasal sinus surgery     Past Medical History  Diagnosis Date  . Night sweats   . Fatigue   . Wears glasses   . SOB (shortness of breath) on exertion     walking and stairts  . Arthritis   . Hot flashes   .  Blood transfusion   . Diverticulitis of colon   . Headache(784.0)   . Anemia   . Breast cancer     T3N1 invasive ductal carcinoma left breast  . Bursitis   . Carpal tunnel syndrome   . Fibromyalgia 08/2012   BP 114/69 mmHg  Pulse 83  Resp 14  SpO2 100%  Opioid Risk Score:   Fall Risk Score: Moderate Fall Risk (6-13 points) (educated and given handout on fall prevention in the home)  Review of Systems  Cardiovascular: Positive for leg swelling.  Musculoskeletal: Positive for gait problem.       Spasms  Neurological: Positive for weakness and numbness.  Psychiatric/Behavioral: The patient is nervous/anxious.   All other systems reviewed and are negative.      Objective:   Physical Exam  Constitutional: She is oriented to person, place, and time. She appears  well-developed and well-nourished.  HENT:  Head: Normocephalic and atraumatic.  Neck: Normal range of motion. Neck supple.  Cardiovascular: Normal rate and regular rhythm.   Pulmonary/Chest: Effort normal and breath sounds normal.  Musculoskeletal:  Normal Muscle Bulk and Muscle Testing Reveals: Upper Extremities: Full ROM and Muscle Strength 5/5 Back without spinal or paraspinal tenderness Lower Extremities: Full ROM and Muscle Strength 5/5 Right Lower Extremity Flexion Produces Pain into Patella Arises from chair with ease Wide based/ Antalgic gait   Neurological: She is alert and oriented to person, place, and time.  Skin: Skin is warm and dry.  Psychiatric: She has a normal mood and affect.  Nursing note and vitals reviewed.         Assessment & Plan:  1. 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 Muscle Spasms: Continue Tizanidine 3.Osteoarthritis Bilateral Knees: Left knee replacement scheduled for 09/09/14. 15 minutes of face to face patient care time was spent during this visit. All questions were encouraged and answered.  F/U in 6 months

## 2014-08-06 ENCOUNTER — Telehealth: Payer: Self-pay | Admitting: Hematology & Oncology

## 2014-08-06 NOTE — Telephone Encounter (Signed)
Received notes from Tammy she is aware to send to [facility] I gave her fax number

## 2014-08-19 ENCOUNTER — Ambulatory Visit: Payer: Self-pay | Admitting: Orthopedic Surgery

## 2014-08-19 NOTE — H&P (Signed)
TOTAL KNEE ADMISSION H&P  Patient is being admitted for left total knee arthroplasty.  Subjective:  Chief Complaint:left knee pain.  HPI: Kelsey Wilson, 54 y.o. female, has a history of pain and functional disability in the left knee due to arthritis and has failed non-surgical conservative treatments for greater than 12 weeks to includeNSAID's and/or analgesics, corticosteriod injections, flexibility and strengthening excercises, use of assistive devices, weight reduction as appropriate and activity modification.  Onset of symptoms was gradual, starting >10 years ago with gradually worsening course since that time. The patient noted prior procedures on the knee to include  arthroscopy on the left knee(s).  Patient currently rates pain in the left knee(s) at 10 out of 10 with activity. Patient has night pain, worsening of pain with activity and weight bearing, pain that interferes with activities of daily living, pain with passive range of motion and crepitus.  Patient has evidence of subchondral cysts, subchondral sclerosis, periarticular osteophytes and joint space narrowing by imaging studies. There is no active infection.  Patient Active Problem List   Diagnosis Date Noted  . Hot flashes 01/19/2014  . Abdominal pain, unspecified site 01/19/2014  . Breast cancer of upper-outer quadrant of left female breast 03/19/2013  . Lumbosacral spondylosis without myelopathy 12/30/2012  . Fibromyalgia syndrome 08/15/2012  . Peripheral neuropathy, toxic 08/15/2012  . Lymphedema of arm - left 04/30/2012   Past Medical History  Diagnosis Date  . Night sweats   . Fatigue   . Wears glasses   . SOB (shortness of breath) on exertion     walking and stairts  . Arthritis   . Hot flashes   . Blood transfusion   . Diverticulitis of colon   . Headache(784.0)   . Anemia   . Breast cancer     T3N1 invasive ductal carcinoma left breast  . Bursitis   . Carpal tunnel syndrome   . Fibromyalgia 08/2012     Past Surgical History  Procedure Laterality Date  . Cholecystectomy    . Appendectomy    . Carpal tunnel release      Bilateral  . Tonsillectomy    . Spur      Apex spur on both big toes  . Knee surgery      Left Knee  . Portacath placement  06/18/2011    Procedure: INSERTION PORT-A-CATH;  Surgeon: Edward Jolly, MD;  Location: [place];  Service: General;  Laterality: Right;  right subclavian  . Abdominal hysterectomy      still has ovaries  . Axillary lymph node dissection  11/28/2011    Procedure: AXILLARY LYMPH NODE DISSECTION;  Surgeon: Edward Jolly, MD;  Location: [place];  Service: General;  Laterality: Left;  . Breast surgery  2013  . Nasal sinus surgery       (Not in a hospital admission) Allergies  Allergen Reactions  . Compazine Other (See Comments)    Numbness of face and lips  . Aleve [Naproxen] Nausea Only  . Oxycodone     hallucinations  . Penicillins Nausea Only    History  Substance Use Topics  . Smoking status: Never Smoker   . Smokeless tobacco: Never Used  . Alcohol Use: No    Family History  Problem Relation Age of Onset  . Cancer Paternal Grandmother     unknown  . Hypertension Mother   . Diabetes Mother   . Hypertension Father   . Diabetes Father      Review of  Systems  Constitutional: Negative.   HENT: Negative for congestion, ear discharge, ear pain, hearing loss, nosebleeds, sore throat and tinnitus.   Eyes: Negative.   Respiratory: Negative.  Negative for shortness of breath and stridor.   Cardiovascular: Negative.  Negative for chest pain.  Gastrointestinal: Negative.   Genitourinary: Negative.   Musculoskeletal: Positive for myalgias and joint pain.       Bilateral knee pain  Skin: Negative.   Neurological: Positive for headaches.  Endo/Heme/Allergies: Negative.   Psychiatric/Behavioral: Negative.     Objective:  Physical Exam  Vitals reviewed. Constitutional: She is oriented to person,  place, and time. She appears well-developed and well-nourished.  HENT:  Head: Normocephalic and atraumatic.  Eyes: Pupils are equal, round, and reactive to light.  Neck: Normal range of motion. Neck supple.  Cardiovascular: Normal rate, regular rhythm, normal heart sounds and intact distal pulses.   Respiratory: Effort normal and breath sounds normal.  GI: Soft. Bowel sounds are normal. She exhibits no distension. There is no tenderness.  Genitourinary:  deferred  Musculoskeletal:       Left knee: She exhibits decreased range of motion, swelling and deformity. Tenderness found. Medial joint line and lateral joint line tenderness noted.  ROM 10-105. Varus deformity  Neurological: She is alert and oriented to person, place, and time. She has normal reflexes.  Skin: Skin is warm and dry.  Psychiatric: She has a normal mood and affect. Her behavior is normal. Judgment and thought content normal.    Vital signs in last 24 hours: @VSRANGES @  Labs:   Estimated body mass index is 43.41 kg/(m^2) as calculated from the following:   Height as of 06/10/14: 5\' 3"  (1.6 m).   Weight as of 06/10/14: 111.131 kg (245 lb).   Imaging Review Plain radiographs demonstrate severe degenerative joint disease of the bilaterally knee(s). The overall alignment ismild varus. The bone quality appears to be adequate for age and reported activity level.  Assessment/Plan:  End stage arthritis, left knee   The patient history, physical examination, clinical judgment of the provider and imaging studies are consistent with end stage degenerative joint disease of the left knee(s) and total knee arthroplasty is deemed medically necessary. The treatment options including medical management, injection therapy arthroscopy and arthroplasty were discussed at length. The risks and benefits of total knee arthroplasty were presented and reviewed. The risks due to aseptic loosening, infection, stiffness, patella tracking problems,  thromboembolic complications and other imponderables were discussed. The patient acknowledged the explanation, agreed to proceed with the plan and consent was signed. Patient is being admitted for inpatient treatment for surgery, pain control, PT, OT, prophylactic antibiotics, VTE prophylaxis, progressive ambulation and ADL's and discharge planning. The patient is planning to be discharged home with home health services

## 2014-08-25 ENCOUNTER — Other Ambulatory Visit: Payer: Self-pay | Admitting: Oncology

## 2014-08-31 ENCOUNTER — Encounter: Payer: Self-pay | Admitting: Oncology

## 2014-08-31 ENCOUNTER — Telehealth: Payer: Self-pay | Admitting: Oncology

## 2014-08-31 NOTE — Progress Notes (Signed)
The patient is going to drop of forms for social svs for [place] to fill out. I advised her 7-10 business days and I will call her once completed. She is going to drop off at front desk on 09/01/14

## 2014-08-31 NOTE — Telephone Encounter (Signed)
Patient requesting assistance with disability form.  Instructed patient I would send her name and contact information to our Care Management department to reach out to her for assistance.  Next appointment is 12-15-14.

## 2014-09-01 ENCOUNTER — Ambulatory Visit: Payer: Self-pay | Admitting: Orthopedic Surgery

## 2014-09-01 NOTE — Pre-Procedure Instructions (Signed)
KNIYAH KHUN  09/01/2014   Your procedure is scheduled on: Monday, September 13, 2014 at 1:00 PM.  Report to [facility] Admitting at 11:00 AM.  Call this number if you have problems the morning of surgery: [phone]   Remember:   Do not eat food or drink liquids after midnight Sunday, September 12, 2014   Take these medicines the morning of surgery with A SIP OF WATER: anastrozole (ARIMIDEX), LYRICA, omeprazole (PRILOSEC), propranolol (INDERAL), venlafaxine XR (EFFEXOR-XR), and eye drop if needed: pain medication, acetaminophen (TYLENOL)  OR  traMADol (ULTRAM)  Stop taking Aspirin, vitamins and herbal medications. Do not take any NSAIDs ie: Ibuprofen, Advil, Naproxen or any medication containing Aspirin such as diclofenac sodium (VOLTAREN); stop 1 week prior to procedure ( Monday, September 06, 2014 )   Do not wear jewelry, make-up or nail polish.  Do not wear lotions, powders, or perfumes. You may not wear deodorant.  Do not shave 48 hours prior to surgery.   Do not bring valuables to the hospital.  [facility] is not responsible for any belongings or valuables.               Contacts, dentures or bridgework may not be worn into surgery.  Leave suitcase in the car. After surgery it may be brought to your room.  For patients admitted to the hospital, discharge time is determined by your treatment team.               Patients discharged the day of surgery will not be allowed to drive home.  Name and phone number of your driver:  Special Instructions:  Shower using CHG soap the night before and the morning of your surgery   [facility] - Preparing for Surgery  Before surgery, you can play an important role.  Because skin is not sterile, your skin needs to be as free of germs as possible.  You can reduce the number of germs on you skin by washing with CHG (chlorahexidine gluconate) soap before surgery.  CHG is an antiseptic cleaner which kills germs and bonds with the skin to  continue killing germs even after washing.  Please DO NOT use if you have an allergy to CHG or antibacterial soaps.  If your skin becomes reddened/irritated stop using the CHG and inform your nurse when you arrive at Short Stay.  Do not shave (including legs and underarms) for at least 48 hours prior to the first CHG shower.  You may shave your face.  Please follow these instructions carefully:   1.  Shower with CHG Soap the night before surgery and the morning of Surgery.  2.  If you choose to wash your hair, wash your hair first as usual with your normal shampoo.  3.  After you shampoo, rinse your hair and body thoroughly to remove the Shampoo.  4.  Use CHG as you would any other liquid soap.  You can apply chg directly  to the skin and wash gently with scrungie or a clean washcloth.  5.  Apply the CHG Soap to your body ONLY FROM THE NECK DOWN.  Do not use on open wounds or open sores.  Avoid contact with your eyes, ears, mouth and genitals (private parts).  Wash genitals (private parts) with your normal soap.  6.  Wash thoroughly, paying special attention to the area where your surgery will be performed.  7.  Thoroughly rinse your body with warm water from the neck down.  8.  DO NOT shower/wash with your normal soap after using and rinsing off the CHG Soap.  9.  Pat yourself dry with a clean towel.            10.  Wear clean pajamas.            11.  Place clean sheets on your bed the night of your first shower and do not sleep with pets.  Day of Surgery  Do not apply any lotions/deodorants the morning of surgery.  Please wear clean clothes to the hospital/surgery center.   Please read over the following fact sheets that you were given: Pain Booklet, Coughing and Deep Breathing, Blood Transfusion Information, Total Joint Packet, MRSA Information and Surgical Site Infection Prevention

## 2014-09-02 ENCOUNTER — Encounter (HOSPITAL_COMMUNITY)
Admission: RE | Admit: 2014-09-02 | Discharge: 2014-09-02 | Disposition: A | Discharge status: Home / Self Care | Payer: Medicare Other | Source: Ambulatory Visit | Attending: Orthopedic Surgery | Admitting: Orthopedic Surgery

## 2014-09-02 ENCOUNTER — Encounter: Payer: Self-pay | Admitting: Oncology

## 2014-09-02 ENCOUNTER — Other Ambulatory Visit: Payer: Self-pay

## 2014-09-02 ENCOUNTER — Encounter (HOSPITAL_COMMUNITY): Payer: Self-pay

## 2014-09-02 DIAGNOSIS — M1712 Unilateral primary osteoarthritis, left knee: Secondary | ICD-10-CM | POA: Diagnosis not present

## 2014-09-02 DIAGNOSIS — Z01812 Encounter for preprocedural laboratory examination: Secondary | ICD-10-CM | POA: Diagnosis present

## 2014-09-02 HISTORY — DX: Depression, unspecified: F32.A

## 2014-09-02 HISTORY — DX: Gastro-esophageal reflux disease without esophagitis: K21.9

## 2014-09-02 HISTORY — DX: Pneumonia, unspecified organism: J18.9

## 2014-09-02 HISTORY — DX: Major depressive disorder, single episode, unspecified: F32.9

## 2014-09-02 HISTORY — DX: Anxiety disorder, unspecified: F41.9

## 2014-09-02 LAB — COMPREHENSIVE METABOLIC PANEL
ALT: 15 U/L (ref 0–35)
AST: 20 U/L (ref 0–37)
Albumin: 3.8 g/dL (ref 3.5–5.2)
Alkaline Phosphatase: 60 U/L (ref 39–117)
Anion gap: 13 (ref 5–15)
BUN: 12 mg/dL (ref 6–23)
CO2: 26 mmol/L (ref 19–32)
Calcium: 9.8 mg/dL (ref 8.4–10.5)
Chloride: 100 mmol/L (ref 96–112)
Creatinine, Ser: 0.73 mg/dL (ref 0.50–1.10)
GFR calc Af Amer: >90 mL/min (ref >90)
GFR calc non Af Amer: >90 mL/min (ref >90)
Glucose, Bld: 101 mg/dL — ABNORMAL HIGH (ref 70–99)
Potassium: 3.6 mmol/L (ref 3.5–5.1)
Sodium: 139 mmol/L (ref 135–145)
Total Bilirubin: 0.7 mg/dL (ref 0.3–1.2)
Total Protein: 7 g/dL (ref 6.0–8.3)

## 2014-09-02 LAB — ABO/RH: ABO/RH(D): O POS

## 2014-09-02 LAB — CBC
HCT: 38.4 % (ref 36.0–46.0)
Hemoglobin: 12.9 g/dL (ref 12.0–15.0)
MCH: 28.6 pg (ref 26.0–34.0)
MCHC: 33.6 g/dL (ref 30.0–36.0)
MCV: 85.1 fL (ref 78.0–100.0)
Platelets: 182 10*3/uL (ref 150–400)
RBC: 4.51 MIL/uL (ref 3.87–5.11)
RDW: 13.9 % (ref 11.5–15.5)
WBC: 3.4 10*3/uL — ABNORMAL LOW (ref 4.0–10.5)

## 2014-09-02 LAB — URINALYSIS, ROUTINE W REFLEX MICROSCOPIC
Bilirubin Urine: NEGATIVE
Glucose, UA: NEGATIVE mg/dL
Hgb urine dipstick: NEGATIVE
Ketones, ur: NEGATIVE mg/dL
Leukocytes, UA: NEGATIVE
Nitrite: NEGATIVE
Protein, ur: NEGATIVE mg/dL
Specific Gravity, Urine: 1.012 (ref 1.005–1.030)
Urobilinogen, UA: 0.2 mg/dL (ref 0.0–1.0)
pH: 6 (ref 5.0–8.0)

## 2014-09-02 LAB — PROTIME-INR
INR: 0.9 (ref 0.00–1.49)
Prothrombin Time: 12.3 seconds (ref 11.6–15.2)

## 2014-09-02 LAB — TYPE AND SCREEN
ABO/RH(D): O POS
Antibody Screen: NEGATIVE

## 2014-09-02 LAB — SURGICAL PCR SCREEN
MRSA, PCR: NEGATIVE
Staphylococcus aureus: NEGATIVE

## 2014-09-02 LAB — APTT: aPTT: 29 seconds (ref 24–37)

## 2014-09-02 NOTE — Progress Notes (Signed)
PCP is Selina Cooley, FNP. Patient denied having any acute cardiac or pulmonary issues.   Patient instructed to take propanolol DOS, however patient informed Nurse that her doctor took her off propanolol and she is not taking it at this time. Patient stated "they told me that medicine did something to your heart and it scared me..... so my doctor told me to stop taking it for now." Patient stated she takes propanolol for migraines (not a heart related issue), and she last took it March 15th.

## 2014-09-02 NOTE — Progress Notes (Signed)
I placed the DSS form on desk of Dr. Jana Hakim.

## 2014-09-03 DIAGNOSIS — R52 Pain, unspecified: Secondary | ICD-10-CM

## 2014-09-10 ENCOUNTER — Encounter: Payer: Self-pay | Admitting: Oncology

## 2014-09-10 NOTE — Progress Notes (Signed)
Spoke with patient and she is coming to get from Ms. Wilma today.

## 2014-09-10 NOTE — Progress Notes (Signed)
I called and left a message for the patient to advise her form is ready and how did she want to get it. I made her a copy for her records also.

## 2014-09-12 MED ORDER — CEFAZOLIN SODIUM-DEXTROSE 2-3 GM-% IV SOLR
2.0000 g | INTRAVENOUS | Status: AC
  Administered 2014-09-13: 2 g via INTRAVENOUS
  Filled 2014-09-12: qty 50

## 2014-09-13 ENCOUNTER — Inpatient Hospital Stay (HOSPITAL_COMMUNITY): Payer: Medicare Other | Admitting: Anesthesiology

## 2014-09-13 ENCOUNTER — Inpatient Hospital Stay (HOSPITAL_COMMUNITY): Payer: Medicare Other

## 2014-09-13 ENCOUNTER — Inpatient Hospital Stay (HOSPITAL_COMMUNITY)
Admission: RE | Admit: 2014-09-13 | Discharge: 2014-09-15 | DRG: 470 | Disposition: A | Discharge status: Home Health | Payer: Medicare Other | Source: Ambulatory Visit | Attending: Orthopedic Surgery | Admitting: Orthopedic Surgery

## 2014-09-13 ENCOUNTER — Encounter (HOSPITAL_COMMUNITY)
Admission: RE | Disposition: A | Discharge status: Home Health | Payer: Self-pay | Source: Ambulatory Visit | Attending: Orthopedic Surgery

## 2014-09-13 ENCOUNTER — Encounter (HOSPITAL_COMMUNITY): Payer: Self-pay | Admitting: *Deleted

## 2014-09-13 DIAGNOSIS — F419 Anxiety disorder, unspecified: Secondary | ICD-10-CM | POA: Diagnosis present

## 2014-09-13 DIAGNOSIS — Z888 Allergy status to other drugs, medicaments and biological substances status: Secondary | ICD-10-CM | POA: Diagnosis not present

## 2014-09-13 DIAGNOSIS — Z880 Allergy status to penicillin: Secondary | ICD-10-CM

## 2014-09-13 DIAGNOSIS — Z8249 Family history of ischemic heart disease and other diseases of the circulatory system: Secondary | ICD-10-CM

## 2014-09-13 DIAGNOSIS — Z7901 Long term (current) use of anticoagulants: Secondary | ICD-10-CM | POA: Diagnosis not present

## 2014-09-13 DIAGNOSIS — Z833 Family history of diabetes mellitus: Secondary | ICD-10-CM

## 2014-09-13 DIAGNOSIS — M25562 Pain in left knee: Secondary | ICD-10-CM | POA: Diagnosis present

## 2014-09-13 DIAGNOSIS — I10 Essential (primary) hypertension: Secondary | ICD-10-CM | POA: Diagnosis present

## 2014-09-13 DIAGNOSIS — K219 Gastro-esophageal reflux disease without esophagitis: Secondary | ICD-10-CM | POA: Diagnosis present

## 2014-09-13 DIAGNOSIS — Z09 Encounter for follow-up examination after completed treatment for conditions other than malignant neoplasm: Secondary | ICD-10-CM

## 2014-09-13 DIAGNOSIS — Z885 Allergy status to narcotic agent status: Secondary | ICD-10-CM | POA: Diagnosis not present

## 2014-09-13 DIAGNOSIS — Z79899 Other long term (current) drug therapy: Secondary | ICD-10-CM

## 2014-09-13 DIAGNOSIS — Z853 Personal history of malignant neoplasm of breast: Secondary | ICD-10-CM

## 2014-09-13 DIAGNOSIS — M1712 Unilateral primary osteoarthritis, left knee: Principal | ICD-10-CM | POA: Diagnosis present

## 2014-09-13 DIAGNOSIS — F329 Major depressive disorder, single episode, unspecified: Secondary | ICD-10-CM | POA: Diagnosis present

## 2014-09-13 DIAGNOSIS — Z886 Allergy status to analgesic agent status: Secondary | ICD-10-CM

## 2014-09-13 DIAGNOSIS — M797 Fibromyalgia: Secondary | ICD-10-CM | POA: Diagnosis present

## 2014-09-13 HISTORY — PX: TOTAL KNEE ARTHROPLASTY: SHX125

## 2014-09-13 IMAGING — CR DG KNEE 1-2V*L*
2 series · 2 of 2 positions shown · non-contrast
Comparison: None.

CLINICAL DATA: Post total knee arthroplasty.

EXAM:
LEFT KNEE - 1-2 VIEW

[AP]
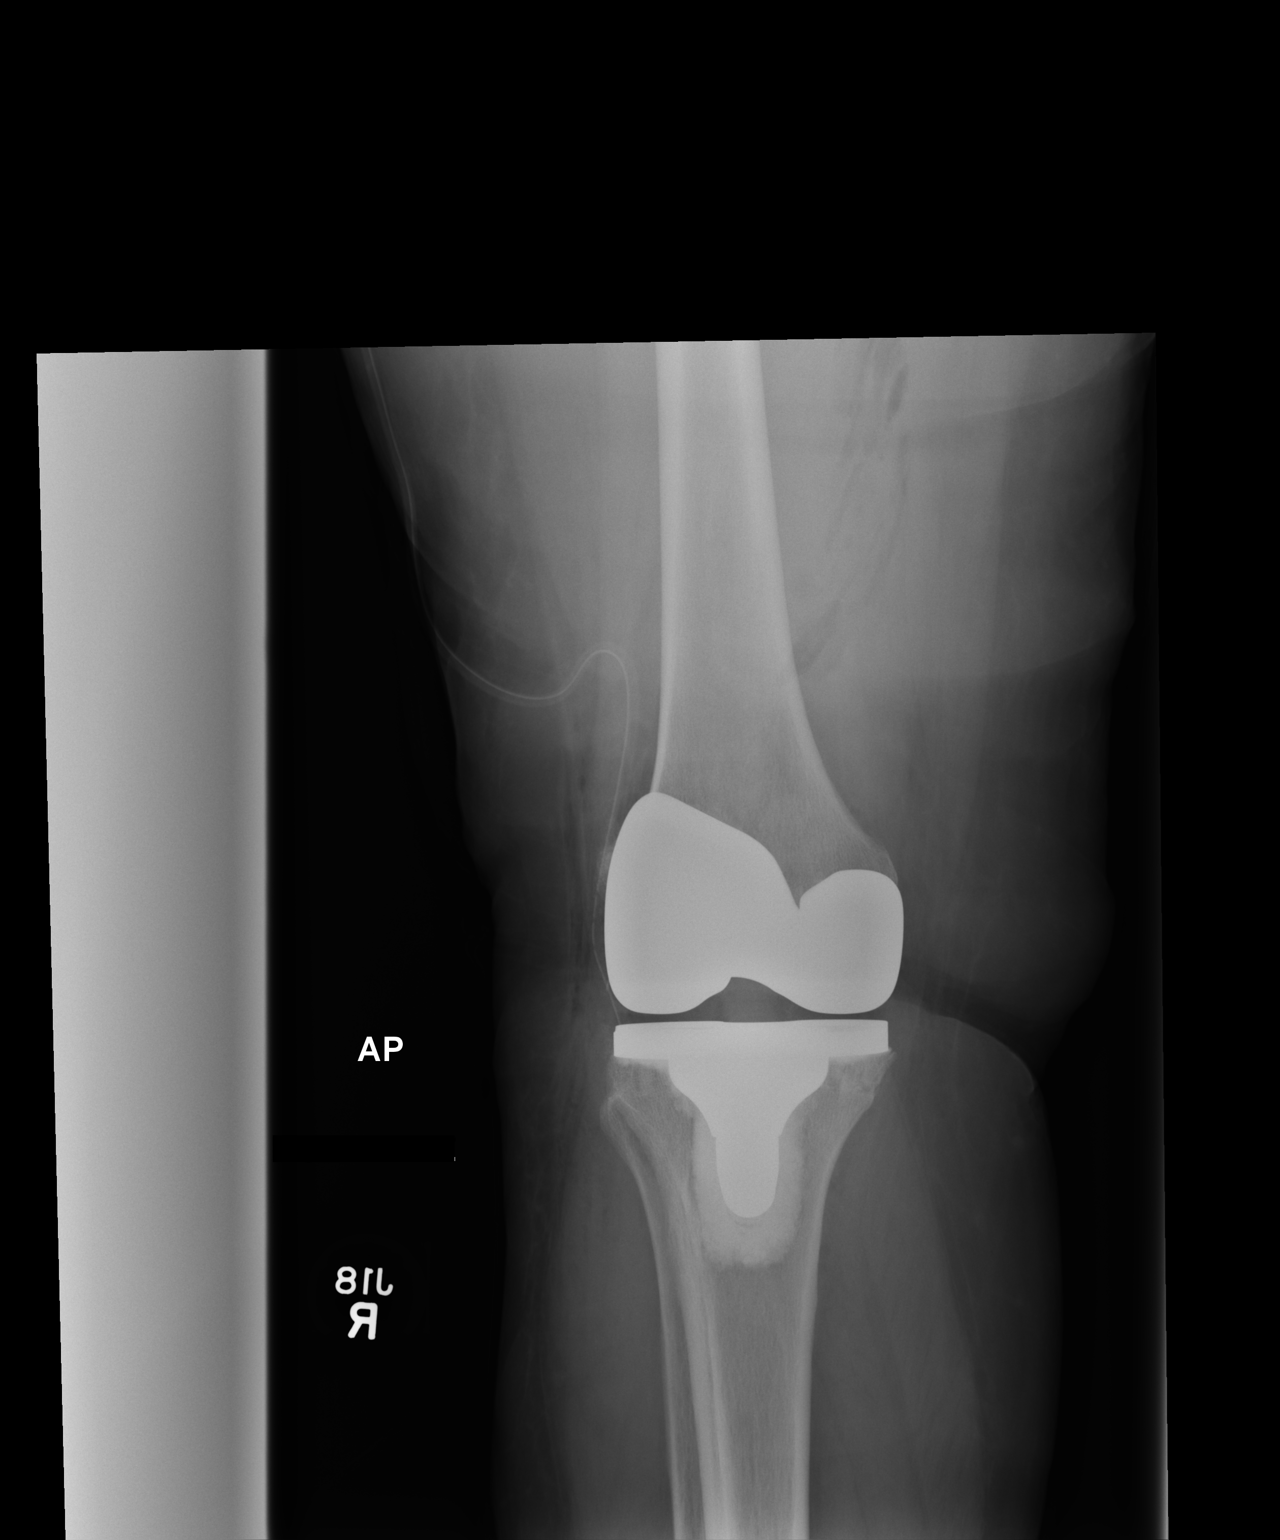

[xtable lateral]
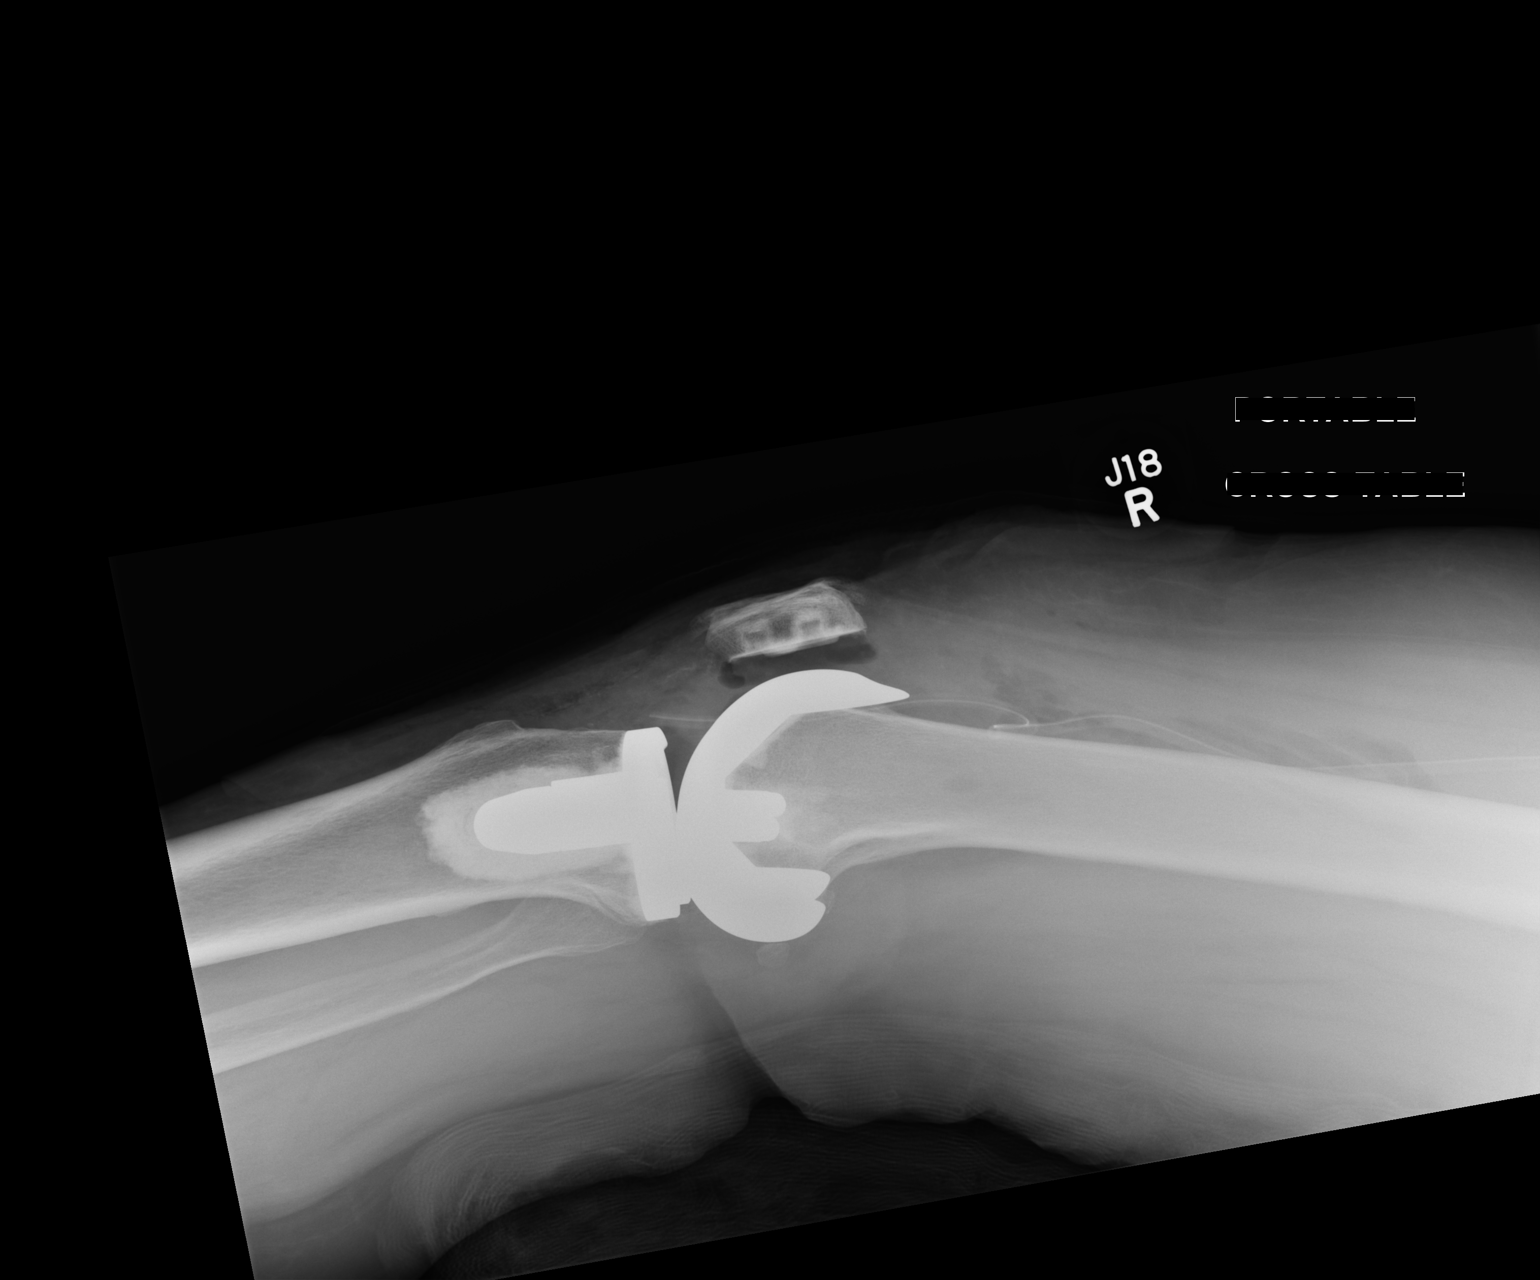

[2 of 2 positions shown; findings below may reference images not displayed]

FINDINGS: Right knee arthroplasty in expected alignment. No periprosthetic
lucency or fracture. There is been patellar resurfacing. Recent
postsurgical change includes surgical drain, air in the soft tissues
and joint, and soft tissue edema.
IMPRESSION: Post right knee arthroplasty without immediate postoperative
complication.

## 2014-09-13 SURGERY — ARTHROPLASTY, KNEE, TOTAL
Anesthesia: Spinal | Laterality: Left

## 2014-09-13 MED ORDER — ACETAMINOPHEN 650 MG RE SUPP: 650.0000 mg | Freq: Four times a day (QID) | RECTAL | Status: DC | PRN

## 2014-09-13 MED ORDER — METOCLOPRAMIDE HCL 5 MG/ML IJ SOLN: 5.0000 mg | Freq: Three times a day (TID) | INTRAMUSCULAR | Status: DC | PRN

## 2014-09-13 MED ORDER — 0.9 % SODIUM CHLORIDE (POUR BTL) OPTIME
TOPICAL | Status: DC | PRN
  Administered 2014-09-13: 1000 mL

## 2014-09-13 MED ORDER — FENTANYL CITRATE 0.05 MG/ML IJ SOLN
50.0000 ug | INTRAMUSCULAR | Status: DC | PRN
  Administered 2014-09-13: 100 ug via INTRAVENOUS

## 2014-09-13 MED ORDER — ROCURONIUM BROMIDE 100 MG/10ML IV SOLN
INTRAVENOUS | Status: DC | PRN
  Administered 2014-09-13: 50 mg via INTRAVENOUS

## 2014-09-13 MED ORDER — MENTHOL 3 MG MT LOZG: 1.0000 | LOZENGE | OROMUCOSAL | Status: DC | PRN

## 2014-09-13 MED ORDER — PHENOL 1.4 % MT LIQD
1.0000 | OROMUCOSAL | Status: DC | PRN
Start: 2014-09-13 — End: 2014-09-15

## 2014-09-13 MED ORDER — LIDOCAINE HCL (CARDIAC) 20 MG/ML IV SOLN
INTRAVENOUS | Status: DC | PRN
  Administered 2014-09-13: 70 mg via INTRAVENOUS

## 2014-09-13 MED ORDER — PHENYLEPHRINE 40 MCG/ML (10ML) SYRINGE FOR IV PUSH (FOR BLOOD PRESSURE SUPPORT)
PREFILLED_SYRINGE | INTRAVENOUS | Status: AC
  Filled 2014-09-13: qty 10

## 2014-09-13 MED ORDER — TRIAMTERENE-HCTZ 37.5-25 MG PO TABS
1.0000 | ORAL_TABLET | Freq: Every day | ORAL | Status: DC
  Administered 2014-09-13 – 2014-09-15 (×3): 1 via ORAL
  Filled 2014-09-13 (×3): qty 1

## 2014-09-13 MED ORDER — SODIUM CHLORIDE 0.9 % IV SOLN
INTRAVENOUS | Status: DC
  Administered 2014-09-13 – 2014-09-14 (×2): via INTRAVENOUS

## 2014-09-13 MED ORDER — DEXAMETHASONE SODIUM PHOSPHATE 4 MG/ML IJ SOLN
INTRAMUSCULAR | Status: AC
  Filled 2014-09-13: qty 1

## 2014-09-13 MED ORDER — ONDANSETRON HCL 4 MG PO TABS
4.0000 mg | ORAL_TABLET | Freq: Four times a day (QID) | ORAL | Status: DC | PRN
  Administered 2014-09-15: 4 mg via ORAL
  Filled 2014-09-13: qty 1

## 2014-09-13 MED ORDER — FENTANYL CITRATE 0.05 MG/ML IJ SOLN
INTRAMUSCULAR | Status: AC
  Filled 2014-09-13: qty 2

## 2014-09-13 MED ORDER — ACETAMINOPHEN 10 MG/ML IV SOLN
1000.0000 mg | INTRAVENOUS | Status: AC
  Administered 2014-09-13: 1000 mg via INTRAVENOUS
  Filled 2014-09-13: qty 100

## 2014-09-13 MED ORDER — DOCUSATE SODIUM 100 MG PO CAPS
100.0000 mg | ORAL_CAPSULE | Freq: Two times a day (BID) | ORAL | Status: DC
  Administered 2014-09-13 – 2014-09-15 (×4): 100 mg via ORAL
  Filled 2014-09-13 (×4): qty 1

## 2014-09-13 MED ORDER — KETOROLAC TROMETHAMINE 15 MG/ML IJ SOLN
15.0000 mg | Freq: Four times a day (QID) | INTRAMUSCULAR | Status: AC
Start: 2014-09-13 — End: 2014-09-14
  Administered 2014-09-13 – 2014-09-14 (×4): 15 mg via INTRAVENOUS
  Filled 2014-09-13 (×4): qty 1

## 2014-09-13 MED ORDER — PANTOPRAZOLE SODIUM 40 MG PO TBEC
40.0000 mg | DELAYED_RELEASE_TABLET | Freq: Every day | ORAL | Status: DC
  Administered 2014-09-14 – 2014-09-15 (×2): 40 mg via ORAL
  Filled 2014-09-13 (×3): qty 1

## 2014-09-13 MED ORDER — ONDANSETRON HCL 4 MG/2ML IJ SOLN
4.0000 mg | Freq: Four times a day (QID) | INTRAMUSCULAR | Status: DC | PRN
  Administered 2014-09-14: 4 mg via INTRAVENOUS
  Filled 2014-09-13: qty 2

## 2014-09-13 MED ORDER — SENNA 8.6 MG PO TABS
2.0000 | ORAL_TABLET | Freq: Every day | ORAL | Status: DC
  Administered 2014-09-13 – 2014-09-14 (×2): 17.2 mg via ORAL
  Filled 2014-09-13 (×2): qty 2

## 2014-09-13 MED ORDER — CHLORHEXIDINE GLUCONATE 4 % EX LIQD
60.0000 mL | Freq: Once | CUTANEOUS | Status: DC
  Filled 2014-09-13: qty 60

## 2014-09-13 MED ORDER — NEOSTIGMINE METHYLSULFATE 10 MG/10ML IV SOLN
INTRAVENOUS | Status: AC
  Filled 2014-09-13: qty 1

## 2014-09-13 MED ORDER — METOCLOPRAMIDE HCL 5 MG PO TABS: 5.0000 mg | ORAL_TABLET | Freq: Three times a day (TID) | ORAL | Status: DC | PRN

## 2014-09-13 MED ORDER — RIVAROXABAN 10 MG PO TABS
10.0000 mg | ORAL_TABLET | Freq: Every day | ORAL | Status: DC
  Administered 2014-09-14 – 2014-09-15 (×2): 10 mg via ORAL
  Filled 2014-09-13 (×2): qty 1

## 2014-09-13 MED ORDER — ONDANSETRON HCL 4 MG/2ML IJ SOLN: 4.0000 mg | Freq: Once | INTRAMUSCULAR | Status: DC | PRN

## 2014-09-13 MED ORDER — ONDANSETRON HCL 4 MG/2ML IJ SOLN
INTRAMUSCULAR | Status: AC
  Filled 2014-09-13: qty 2

## 2014-09-13 MED ORDER — HYDROCODONE-ACETAMINOPHEN 5-325 MG PO TABS
1.0000 | ORAL_TABLET | ORAL | Status: DC | PRN
  Administered 2014-09-13 – 2014-09-14 (×2): 2 via ORAL
  Administered 2014-09-14: 1 via ORAL
  Administered 2014-09-14 (×2): 2 via ORAL
  Administered 2014-09-15 (×4): 1 via ORAL
  Filled 2014-09-13: qty 1
  Filled 2014-09-13: qty 2
  Filled 2014-09-13: qty 1
  Filled 2014-09-13: qty 2
  Filled 2014-09-13: qty 1
  Filled 2014-09-13: qty 2
  Filled 2014-09-13: qty 1
  Filled 2014-09-13: qty 2
  Filled 2014-09-13: qty 1

## 2014-09-13 MED ORDER — DIPHENHYDRAMINE HCL 12.5 MG/5ML PO ELIX: 12.5000 mg | ORAL_SOLUTION | ORAL | Status: DC | PRN

## 2014-09-13 MED ORDER — SODIUM CHLORIDE 0.9 % IV SOLN: INTRAVENOUS | Status: DC

## 2014-09-13 MED ORDER — HYDROMORPHONE HCL 1 MG/ML IJ SOLN
INTRAMUSCULAR | Status: AC
  Administered 2014-09-13: 0.5 mg via INTRAVENOUS
  Filled 2014-09-13: qty 1

## 2014-09-13 MED ORDER — OXYCODONE HCL 5 MG PO TABS: 5.0000 mg | ORAL_TABLET | Freq: Once | ORAL | Status: DC | PRN

## 2014-09-13 MED ORDER — CEFAZOLIN SODIUM-DEXTROSE 2-3 GM-% IV SOLR
2.0000 g | Freq: Four times a day (QID) | INTRAVENOUS | Status: AC
  Administered 2014-09-13 – 2014-09-14 (×2): 2 g via INTRAVENOUS
  Filled 2014-09-13 (×3): qty 50

## 2014-09-13 MED ORDER — MIDAZOLAM HCL 2 MG/2ML IJ SOLN
INTRAMUSCULAR | Status: AC
  Filled 2014-09-13: qty 2

## 2014-09-13 MED ORDER — VENLAFAXINE HCL ER 37.5 MG PO CP24
37.5000 mg | ORAL_CAPSULE | Freq: Every day | ORAL | Status: DC
  Filled 2014-09-13 (×3): qty 1

## 2014-09-13 MED ORDER — MIDAZOLAM HCL 5 MG/5ML IJ SOLN
INTRAMUSCULAR | Status: DC | PRN
  Administered 2014-09-13 (×2): 1 mg via INTRAVENOUS

## 2014-09-13 MED ORDER — FENTANYL CITRATE 0.05 MG/ML IJ SOLN
INTRAMUSCULAR | Status: DC | PRN
  Administered 2014-09-13 (×3): 50 ug via INTRAVENOUS
  Administered 2014-09-13: 100 ug via INTRAVENOUS

## 2014-09-13 MED ORDER — MIDAZOLAM HCL 2 MG/2ML IJ SOLN
1.0000 mg | INTRAMUSCULAR | Status: DC | PRN
  Administered 2014-09-13: 2 mg via INTRAVENOUS

## 2014-09-13 MED ORDER — ANASTROZOLE 1 MG PO TABS
1.0000 mg | ORAL_TABLET | Freq: Every day | ORAL | Status: DC
  Administered 2014-09-14 – 2014-09-15 (×2): 1 mg via ORAL
  Filled 2014-09-13 (×3): qty 1

## 2014-09-13 MED ORDER — ACETAMINOPHEN 325 MG PO TABS: 650.0000 mg | ORAL_TABLET | Freq: Four times a day (QID) | ORAL | Status: DC | PRN

## 2014-09-13 MED ORDER — LACTATED RINGERS IV SOLN
INTRAVENOUS | Status: DC | PRN
  Administered 2014-09-13 (×2): via INTRAVENOUS

## 2014-09-13 MED ORDER — BUPIVACAINE-EPINEPHRINE (PF) 0.25% -1:200000 IJ SOLN
INTRAMUSCULAR | Status: AC
  Filled 2014-09-13: qty 30

## 2014-09-13 MED ORDER — ALUM & MAG HYDROXIDE-SIMETH 200-200-20 MG/5ML PO SUSP: 30.0000 mL | ORAL | Status: DC | PRN

## 2014-09-13 MED ORDER — LACTATED RINGERS IV SOLN
INTRAVENOUS | Status: DC
  Administered 2014-09-13: 12:00:00 via INTRAVENOUS

## 2014-09-13 MED ORDER — LIDOCAINE HCL (CARDIAC) 20 MG/ML IV SOLN
INTRAVENOUS | Status: AC
  Filled 2014-09-13: qty 10

## 2014-09-13 MED ORDER — ALPRAZOLAM 0.25 MG PO TABS: 0.2500 mg | ORAL_TABLET | Freq: Every evening | ORAL | Status: DC | PRN

## 2014-09-13 MED ORDER — ARTIFICIAL TEARS OP OINT
TOPICAL_OINTMENT | OPHTHALMIC | Status: AC
  Filled 2014-09-13: qty 3.5

## 2014-09-13 MED ORDER — NEOSTIGMINE METHYLSULFATE 10 MG/10ML IV SOLN
INTRAVENOUS | Status: DC | PRN
  Administered 2014-09-13: 3 mg via INTRAVENOUS

## 2014-09-13 MED ORDER — PREGABALIN 100 MG PO CAPS
100.0000 mg | ORAL_CAPSULE | Freq: Three times a day (TID) | ORAL | Status: DC
  Administered 2014-09-13 – 2014-09-15 (×6): 100 mg via ORAL
  Filled 2014-09-13 (×6): qty 1

## 2014-09-13 MED ORDER — PROPRANOLOL HCL 80 MG PO TABS
80.0000 mg | ORAL_TABLET | Freq: Two times a day (BID) | ORAL | Status: DC
  Filled 2014-09-13 (×5): qty 1

## 2014-09-13 MED ORDER — DEXAMETHASONE SODIUM PHOSPHATE 4 MG/ML IJ SOLN
INTRAMUSCULAR | Status: DC | PRN
  Administered 2014-09-13: 4 mg via INTRAVENOUS

## 2014-09-13 MED ORDER — PROPOFOL 10 MG/ML IV BOLUS
INTRAVENOUS | Status: DC | PRN
  Administered 2014-09-13 (×2): 10 mg via INTRAVENOUS
  Administered 2014-09-13: 180 mg via INTRAVENOUS

## 2014-09-13 MED ORDER — PREDNISOLONE ACETATE 1 % OP SUSP
1.0000 [drp] | Freq: Four times a day (QID) | OPHTHALMIC | Status: DC
  Administered 2014-09-13 – 2014-09-15 (×8): 1 [drp] via OPHTHALMIC
  Filled 2014-09-13: qty 1

## 2014-09-13 MED ORDER — BUPIVACAINE-EPINEPHRINE (PF) 0.25% -1:200000 IJ SOLN
INTRAMUSCULAR | Status: DC | PRN
  Administered 2014-09-13: 30 mL via PERINEURAL

## 2014-09-13 MED ORDER — MIDAZOLAM HCL 2 MG/2ML IJ SOLN
INTRAMUSCULAR | Status: AC
Start: 2014-09-13 — End: 2014-09-13
  Filled 2014-09-13: qty 2

## 2014-09-13 MED ORDER — GLYCOPYRROLATE 0.2 MG/ML IJ SOLN
INTRAMUSCULAR | Status: AC
  Filled 2014-09-13: qty 3

## 2014-09-13 MED ORDER — BUPIVACAINE-EPINEPHRINE (PF) 0.5% -1:200000 IJ SOLN
INTRAMUSCULAR | Status: DC | PRN
  Administered 2014-09-13: 25 mL via PERINEURAL

## 2014-09-13 MED ORDER — OXYCODONE HCL 5 MG/5ML PO SOLN: 5.0000 mg | Freq: Once | ORAL | Status: DC | PRN

## 2014-09-13 MED ORDER — HYDROMORPHONE HCL 1 MG/ML IJ SOLN
0.2500 mg | INTRAMUSCULAR | Status: DC | PRN
  Administered 2014-09-13 (×4): 0.5 mg via INTRAVENOUS

## 2014-09-13 MED ORDER — METHOCARBAMOL 500 MG PO TABS
500.0000 mg | ORAL_TABLET | Freq: Four times a day (QID) | ORAL | Status: DC | PRN
  Administered 2014-09-14 – 2014-09-15 (×2): 500 mg via ORAL
  Filled 2014-09-13 (×3): qty 1

## 2014-09-13 MED ORDER — PHENYLEPHRINE HCL 10 MG/ML IJ SOLN
INTRAMUSCULAR | Status: DC | PRN
  Administered 2014-09-13: 80 ug via INTRAVENOUS

## 2014-09-13 MED ORDER — ONDANSETRON HCL 4 MG/2ML IJ SOLN
INTRAMUSCULAR | Status: DC | PRN
  Administered 2014-09-13: 4 mg via INTRAVENOUS

## 2014-09-13 MED ORDER — TRANEXAMIC ACID 100 MG/ML IV SOLN
2000.0000 mg | INTRAVENOUS | Status: AC
  Administered 2014-09-13: 2000 mg via TOPICAL
  Filled 2014-09-13 (×2): qty 20

## 2014-09-13 MED ORDER — GLYCOPYRROLATE 0.2 MG/ML IJ SOLN
INTRAMUSCULAR | Status: DC | PRN
  Administered 2014-09-13: 0.4 mg via INTRAVENOUS

## 2014-09-13 MED ORDER — FENTANYL CITRATE 0.05 MG/ML IJ SOLN
INTRAMUSCULAR | Status: AC
  Filled 2014-09-13: qty 5

## 2014-09-13 MED ORDER — DEXAMETHASONE SODIUM PHOSPHATE 10 MG/ML IJ SOLN
10.0000 mg | Freq: Once | INTRAMUSCULAR | Status: AC
  Administered 2014-09-14: 10 mg via INTRAVENOUS
  Filled 2014-09-13: qty 1

## 2014-09-13 MED ORDER — KETOROLAC TROMETHAMINE 30 MG/ML IJ SOLN
INTRAMUSCULAR | Status: DC | PRN
  Administered 2014-09-13: 30 mg via INTRAVENOUS

## 2014-09-13 MED ORDER — ARTIFICIAL TEARS OP OINT
TOPICAL_OINTMENT | OPHTHALMIC | Status: DC | PRN
  Administered 2014-09-13: 1 via OPHTHALMIC

## 2014-09-13 MED ORDER — METHOCARBAMOL 1000 MG/10ML IJ SOLN
500.0000 mg | Freq: Four times a day (QID) | INTRAVENOUS | Status: DC | PRN
  Filled 2014-09-13: qty 5

## 2014-09-13 MED ORDER — HYDROMORPHONE HCL 1 MG/ML IJ SOLN: 0.5000 mg | INTRAMUSCULAR | Status: DC | PRN

## 2014-09-13 MED ORDER — SODIUM CHLORIDE 0.9 % IJ SOLN
INTRAMUSCULAR | Status: DC | PRN
  Administered 2014-09-13: 30 mL via INTRAVENOUS

## 2014-09-13 MED ORDER — PROPOFOL 10 MG/ML IV BOLUS
INTRAVENOUS | Status: AC
  Filled 2014-09-13: qty 20

## 2014-09-13 MED ORDER — GLYCOPYRROLATE 0.2 MG/ML IJ SOLN
INTRAMUSCULAR | Status: AC
Start: 2014-09-13 — End: 2014-09-13
  Filled 2014-09-13: qty 3

## 2014-09-13 MED ORDER — ROCURONIUM BROMIDE 50 MG/5ML IV SOLN
INTRAVENOUS | Status: AC
  Filled 2014-09-13: qty 1

## 2014-09-13 MED ORDER — TIZANIDINE HCL 4 MG PO TABS
4.0000 mg | ORAL_TABLET | Freq: Three times a day (TID) | ORAL | Status: DC | PRN
  Administered 2014-09-13 – 2014-09-15 (×5): 4 mg via ORAL
  Filled 2014-09-13 (×5): qty 1

## 2014-09-13 MED ORDER — KETOROLAC TROMETHAMINE 30 MG/ML IJ SOLN
INTRAMUSCULAR | Status: AC
  Filled 2014-09-13: qty 1

## 2014-09-13 SURGICAL SUPPLY — 64 items
ADH SKN CLS LQ APL DERMABOND (GAUZE/BANDAGES/DRESSINGS) ×2
BANDAGE ELASTIC 6 VELCRO ST LF (GAUZE/BANDAGES/DRESSINGS) ×2 IMPLANT
BANDAGE ESMARK 6X9 LF (GAUZE/BANDAGES/DRESSINGS) ×1 IMPLANT
BLADE SAW SGTL 13.0X1.19X90.0M (BLADE) IMPLANT
BNDG CMPR 9X6 STRL LF SNTH (GAUZE/BANDAGES/DRESSINGS) ×1
BNDG ESMARK 6X9 LF (GAUZE/BANDAGES/DRESSINGS) ×2
BONE CEMENT SIMPLEX TOBRAMYCIN (Cement) ×6 IMPLANT
BOWL SMART MIX CTS (DISPOSABLE) ×2 IMPLANT
CAPT KNEE TOTAL 3 ×2 IMPLANT
CEMENT BONE SIMPLEX TOBRAMYCIN (Cement) ×3 IMPLANT
CEMENT KYPHON C01A KIT/MIXER (Cement) ×2 IMPLANT
CHLORAPREP W/TINT 26ML (MISCELLANEOUS) ×4 IMPLANT
CUFF TOURNIQUET SINGLE 34IN LL (TOURNIQUET CUFF) ×2 IMPLANT
DECANTER SPIKE VIAL GLASS SM (MISCELLANEOUS) ×2 IMPLANT
DERMABOND ADHESIVE PROPEN (GAUZE/BANDAGES/DRESSINGS) ×2
DERMABOND ADVANCED .7 DNX6 (GAUZE/BANDAGES/DRESSINGS) ×2 IMPLANT
DRAPE EXTREMITY T 121X128X90 (DRAPE) ×2 IMPLANT
DRAPE POUCH INSTRU U-SHP 10X18 (DRAPES) ×2 IMPLANT
DRAPE U-SHAPE 47X51 STRL (DRAPES) ×2 IMPLANT
DRSG AQUACEL AG ADV 3.5X10 (GAUZE/BANDAGES/DRESSINGS) ×2 IMPLANT
DRSG TEGADERM 4X4.75 (GAUZE/BANDAGES/DRESSINGS) ×2 IMPLANT
DURAPREP 26ML APPLICATOR (WOUND CARE) IMPLANT
ELECT REM PT RETURN 9FT ADLT (ELECTROSURGICAL) ×2
ELECTRODE REM PT RTRN 9FT ADLT (ELECTROSURGICAL) ×1 IMPLANT
FACESHIELD WRAPAROUND (MASK) IMPLANT
GLOVE BIO SURGEON STRL SZ 6.5 (GLOVE) ×2 IMPLANT
GLOVE BIOGEL PI IND STRL 6.5 (GLOVE) ×1 IMPLANT
GLOVE BIOGEL PI IND STRL 7.0 (GLOVE) ×1 IMPLANT
GLOVE BIOGEL PI IND STRL 7.5 (GLOVE) ×1 IMPLANT
GLOVE BIOGEL PI IND STRL 8.5 (GLOVE) ×1 IMPLANT
GLOVE BIOGEL PI INDICATOR 6.5 (GLOVE) ×1
GLOVE BIOGEL PI INDICATOR 7.0 (GLOVE) ×1
GLOVE BIOGEL PI INDICATOR 7.5 (GLOVE) ×1
GLOVE BIOGEL PI INDICATOR 8.5 (GLOVE) ×1
GLOVE ECLIPSE 8.0 STRL XLNG CF (GLOVE) ×4 IMPLANT
GLOVE ORTHO TXT STRL SZ7.5 (GLOVE) ×4 IMPLANT
GLOVE SURG SS PI 7.0 STRL IVOR (GLOVE) ×4 IMPLANT
GOWN SPEC L3 XXLG W/TWL (GOWN DISPOSABLE) ×2 IMPLANT
GOWN STRL REUS W/TWL LRG LVL3 (GOWN DISPOSABLE) ×2 IMPLANT
HANDPIECE INTERPULSE COAX TIP (DISPOSABLE) ×2
HOOD SURGICAL BLUE (PROTECTIVE WEAR) ×4 IMPLANT
KIT BASIN OR (CUSTOM PROCEDURE TRAY) ×2 IMPLANT
LIQUID BAND (GAUZE/BANDAGES/DRESSINGS) ×2 IMPLANT
MANIFOLD NEPTUNE II (INSTRUMENTS) ×2 IMPLANT
NDL SAFETY ECLIPSE 18X1.5 (NEEDLE) ×2 IMPLANT
NEEDLE HYPO 18GX1.5 SHARP (NEEDLE) ×4
PACK TOTAL JOINT (CUSTOM PROCEDURE TRAY) ×2 IMPLANT
PADDING CAST COTTON 6X4 STRL (CAST SUPPLIES) ×2 IMPLANT
SET HNDPC FAN SPRY TIP SCT (DISPOSABLE) ×1 IMPLANT
SET PAD KNEE POSITIONER (MISCELLANEOUS) ×4 IMPLANT
SUCTION FRAZIER TIP 10 FR DISP (SUCTIONS) ×2 IMPLANT
SUT MNCRL 3 0 VIOLET RB1 (SUTURE) ×1 IMPLANT
SUT MNCRL AB 4-0 PS2 18 (SUTURE) ×2 IMPLANT
SUT MON AB 2-0 CT1 36 (SUTURE) ×2 IMPLANT
SUT MONOCRYL 3 0 RB1 (SUTURE) ×1
SUT VIC AB 1 CT1 36 (SUTURE) ×2 IMPLANT
SUT VIC AB 2-0 CT1 27 (SUTURE) ×6
SUT VIC AB 2-0 CT1 TAPERPNT 27 (SUTURE) ×3 IMPLANT
SUT VLOC 180 0 24IN GS25 (SUTURE) ×2 IMPLANT
SYR 50ML LL SCALE MARK (SYRINGE) ×4 IMPLANT
TOWEL OR 17X26 10 PK STRL BLUE (TOWEL DISPOSABLE) ×2 IMPLANT
TOWEL OR NON WOVEN STRL DISP B (DISPOSABLE) IMPLANT
TRAY FOLEY CATH 14FRSI W/METER (CATHETERS) IMPLANT
WRAP KNEE MAXI GEL POST OP (GAUZE/BANDAGES/DRESSINGS) ×2 IMPLANT

## 2014-09-13 NOTE — Op Note (Signed)
OPERATIVE REPORT  SURGEON: Rod Can, MD   ASSISTANT: Ky Barban, RNFA.  PREOPERATIVE DIAGNOSIS: Left knee arthritis.   POSTOPERATIVE DIAGNOSIS: Left knee arthritis.   PROCEDURE: Left total knee arthroplasty.   IMPLANTS: Stryker Triathlon CR femur, size 4. Stryker universal tibia, size 3. X3 polyethelyene insert, size 9 mm, CR. 3 button asymmetric patella, size 32 mm. Simplex P bone cement.  ANESTHESIA:  General  ESTIMATED BLOOD LOSS: 50 mL.  ANTIBIOTICS: 2g ancef.  DRAINS: Medium HV x1.  COMPLICATIONS: None   CONDITION: PACU - hemodynamically stable.   BRIEF CLINICAL NOTE: Kelsey Wilson is a 54 y.o. female with a long-standing history of Left knee arthritis. After failing conservative management, the patient was indicated for total knee arthroplasty. The risks, benefits, and alternatives to the procedure were explained, and the patient elected to proceed.  PROCEDURE IN DETAIL: Spinal anesthesia was obtained in the pre-op holding area. Once inside the operative room, a foley catheter was inserted. The patient was then positioned, a nonsterile tourniquet was placed, and the lower extremity was prepped and draped in the normal sterile surgical fashion. A time-out was called verifying side and site of surgery. The patient received IV antibiotics within 60 minutes of beginning the procedure. The extremity was exsanguinated with an Esmarch, and the tourniquet was inflated.  An anterior approach to the knee was performed utilizing a midvastus arthrotomy. A medial release was performed and the patellar fat pad was excised. A 4-degree distal femur valgus cut was made with an intramedullary guide. A freehand patellar resection was performed, and the patella was sized an prepared with 3 lug holes.  The proximal tibial resection was performed using an extramedullary guide, leaving a bone island for the PCL. The menisci were excised. A spacer block was placed, and the  alignment and balance in extension were confirmed.   The distal femur was sized using the 3-degree external rotation guide referencing the posterior femoral cortex. Rotation was checked in flexion using a non-captured guide and a spacer block. The appropriate 4-in-1 cutting block was pinned into place, and the remaining femoral cuts were performed, taking care to protect the MCL.  The tibia was sized and the trial tray was pinned into place. The remaining trail components were inserted. The knee was stable to varus and valgus stress through a full range of motion. The patella tracked centrally, and the PCL was well balanced. The trial components were removed, and the proximal tibial surface was prepared. Small drill holes were made in the sclerotic subchondral bone.The cut bony surfaces were irrigated with pulse lavage. Final components were cemented into place and excess cement was cleared. The trial insert was placed, and the knee was brought into extension while the cement polymerized. Once the cement was hard, the knee was tested for a final time and found to be well balanced. The trial insert was exchanged for the real polyethylene insert.  The wound was copiously irrigated with a dilute betadine solution followed by normal saline with pulse lavage. Marcaine solution was injected into the periarticular soft tissue. The wound was closed in layers using #1 Vicryl and V-Loc for the fascia, 2-0 Vicryl for the subcutaneous fat, 2-0 Monocryl for the deep dermal layer, 3-0 running Monocryl subcuticular stitch, and Dermabond for the skin. Once the glue was fully dried, an Aquacell Ag and compressive dressing were applied. The tourniquet was let down, and the patient was transported to the recovery room in stable ondition. Sponge, needle, and instrument counts were correct  at the end of the case x2. The patient tolerated the procedure well and there were no known complications.

## 2014-09-13 NOTE — Anesthesia Postprocedure Evaluation (Signed)
Anesthesia Post Note  Patient: Kelsey Wilson  Procedure(s) Performed: Procedure(s) (LRB): LEFT TOTAL KNEE ARTHROPLASTY (Left)  Anesthesia type: general  Patient location: PACU  Post pain: Pain level controlled  Post assessment: Patient's Cardiovascular Status Stable  Last Vitals:  Filed Vitals:   09/13/14 1637  BP: 134/70  Pulse: 92  Temp: 36.5 C  Resp: 16    Post vital signs: Reviewed and stable  Level of consciousness: sedated  Complications: No apparent anesthesia complications

## 2014-09-13 NOTE — Interval H&P Note (Signed)
History and Physical Interval Note:  09/13/2014 12:42 PM  Kelsey Wilson  has presented today for surgery, with the diagnosis of LEFT KNEE OA  The various methods of treatment have been discussed with the patient and family. After consideration of risks, benefits and other options for treatment, the patient has consented to  Procedure(s): LEFT TOTAL KNEE ARTHROPLASTY (Left) as a surgical intervention .  The patient's history has been reviewed, patient examined, no change in status, stable for surgery.  I have reviewed the patient's chart and labs.  Questions were answered to the patient's satisfaction.     Rosslyn Pasion, Horald Pollen

## 2014-09-13 NOTE — Transfer of Care (Signed)
Immediate Anesthesia Transfer of Care Note  Patient: Kelsey Wilson  Procedure(s) Performed: Procedure(s): LEFT TOTAL KNEE ARTHROPLASTY (Left)  Patient Location: PACU  Anesthesia Type:General  Level of Consciousness: awake, alert  and oriented  Airway & Oxygen Therapy: Patient Spontanous Breathing and Patient connected to nasal cannula oxygen  Post-op Assessment: Report given to RN and Post -op Vital signs reviewed and stable  Post vital signs: Reviewed and stable  Last Vitals:  Filed Vitals:   09/13/14 1300  BP: 106/37  Pulse: 83  Temp:   Resp: 21    Complications: No apparent anesthesia complications

## 2014-09-13 NOTE — H&P (View-Only) (Signed)
TOTAL KNEE ADMISSION H&P  Patient is being admitted for left total knee arthroplasty.  Subjective:  Chief Complaint:left knee pain.  HPI: Kelsey Wilson, 54 y.o. female, has a history of pain and functional disability in the left knee due to arthritis and has failed non-surgical conservative treatments for greater than 12 weeks to includeNSAID's and/or analgesics, corticosteriod injections, flexibility and strengthening excercises, use of assistive devices, weight reduction as appropriate and activity modification.  Onset of symptoms was gradual, starting >10 years ago with gradually worsening course since that time. The patient noted prior procedures on the knee to include  arthroscopy on the left knee(s).  Patient currently rates pain in the left knee(s) at 10 out of 10 with activity. Patient has night pain, worsening of pain with activity and weight bearing, pain that interferes with activities of daily living, pain with passive range of motion and crepitus.  Patient has evidence of subchondral cysts, subchondral sclerosis, periarticular osteophytes and joint space narrowing by imaging studies. There is no active infection.  Patient Active Problem List   Diagnosis Date Noted  . Hot flashes 01/19/2014  . Abdominal pain, unspecified site 01/19/2014  . Breast cancer of upper-outer quadrant of left female breast 03/19/2013  . Lumbosacral spondylosis without myelopathy 12/30/2012  . Fibromyalgia syndrome 08/15/2012  . Peripheral neuropathy, toxic 08/15/2012  . Lymphedema of arm - left 04/30/2012   Past Medical History  Diagnosis Date  . Night sweats   . Fatigue   . Wears glasses   . SOB (shortness of breath) on exertion     walking and stairts  . Arthritis   . Hot flashes   . Blood transfusion   . Diverticulitis of colon   . Headache(784.0)   . Anemia   . Breast cancer     T3N1 invasive ductal carcinoma left breast  . Bursitis   . Carpal tunnel syndrome   . Fibromyalgia 08/2012     Past Surgical History  Procedure Laterality Date  . Cholecystectomy    . Appendectomy    . Carpal tunnel release      Bilateral  . Tonsillectomy    . Spur      Apex spur on both big toes  . Knee surgery      Left Knee  . Portacath placement  06/18/2011    Procedure: INSERTION PORT-A-CATH;  Surgeon: Edward Jolly, MD;  Location: [facility];  Service: General;  Laterality: Right;  right subclavian  . Abdominal hysterectomy      still has ovaries  . Axillary lymph node dissection  11/28/2011    Procedure: AXILLARY LYMPH NODE DISSECTION;  Surgeon: Edward Jolly, MD;  Location: [facility];  Service: General;  Laterality: Left;  . Breast surgery  2013  . Nasal sinus surgery       (Not in a hospital admission) Allergies  Allergen Reactions  . Compazine Other (See Comments)    Numbness of face and lips  . Aleve [Naproxen] Nausea Only  . Oxycodone     hallucinations  . Penicillins Nausea Only    History  Substance Use Topics  . Smoking status: Never Smoker   . Smokeless tobacco: Never Used  . Alcohol Use: No    Family History  Problem Relation Age of Onset  . Cancer Paternal Grandmother     unknown  . Hypertension Mother   . Diabetes Mother   . Hypertension Father   . Diabetes Father      Review of  Systems  Constitutional: Negative.   HENT: Negative for congestion, ear discharge, ear pain, hearing loss, nosebleeds, sore throat and tinnitus.   Eyes: Negative.   Respiratory: Negative.  Negative for shortness of breath and stridor.   Cardiovascular: Negative.  Negative for chest pain.  Gastrointestinal: Negative.   Genitourinary: Negative.   Musculoskeletal: Positive for myalgias and joint pain.       Bilateral knee pain  Skin: Negative.   Neurological: Positive for headaches.  Endo/Heme/Allergies: Negative.   Psychiatric/Behavioral: Negative.     Objective:  Physical Exam  Vitals reviewed. Constitutional: She is oriented to person,  place, and time. She appears well-developed and well-nourished.  HENT:  Head: Normocephalic and atraumatic.  Eyes: Pupils are equal, round, and reactive to light.  Neck: Normal range of motion. Neck supple.  Cardiovascular: Normal rate, regular rhythm, normal heart sounds and intact distal pulses.   Respiratory: Effort normal and breath sounds normal.  GI: Soft. Bowel sounds are normal. She exhibits no distension. There is no tenderness.  Genitourinary:  deferred  Musculoskeletal:       Left knee: She exhibits decreased range of motion, swelling and deformity. Tenderness found. Medial joint line and lateral joint line tenderness noted.  ROM 10-105. Varus deformity  Neurological: She is alert and oriented to person, place, and time. She has normal reflexes.  Skin: Skin is warm and dry.  Psychiatric: She has a normal mood and affect. Her behavior is normal. Judgment and thought content normal.    Vital signs in last 24 hours: @VSRANGES @  Labs:   Estimated body mass index is 43.41 kg/(m^2) as calculated from the following:   Height as of 06/10/14: 5\' 3"  (1.6 m).   Weight as of 06/10/14: 111.131 kg (245 lb).   Imaging Review Plain radiographs demonstrate severe degenerative joint disease of the bilaterally knee(s). The overall alignment ismild varus. The bone quality appears to be adequate for age and reported activity level.  Assessment/Plan:  End stage arthritis, left knee   The patient history, physical examination, clinical judgment of the provider and imaging studies are consistent with end stage degenerative joint disease of the left knee(s) and total knee arthroplasty is deemed medically necessary. The treatment options including medical management, injection therapy arthroscopy and arthroplasty were discussed at length. The risks and benefits of total knee arthroplasty were presented and reviewed. The risks due to aseptic loosening, infection, stiffness, patella tracking problems,  thromboembolic complications and other imponderables were discussed. The patient acknowledged the explanation, agreed to proceed with the plan and consent was signed. Patient is being admitted for inpatient treatment for surgery, pain control, PT, OT, prophylactic antibiotics, VTE prophylaxis, progressive ambulation and ADL's and discharge planning. The patient is planning to be discharged home with home health services

## 2014-09-13 NOTE — Anesthesia Procedure Notes (Addendum)
Procedure Name: Intubation Date/Time: 09/13/2014 1:21 PM Performed by: Susa Loffler Pre-anesthesia Checklist: Patient identified, Emergency Drugs available, Suction available, Patient being monitored and Timeout performed Patient Re-evaluated:Patient Re-evaluated prior to inductionOxygen Delivery Method: Circle system utilized Preoxygenation: Pre-oxygenation with 100% oxygen Intubation Type: IV induction Ventilation: Mask ventilation without difficulty Laryngoscope Size: Mac and 3 Grade View: Grade I Tube type: Oral Tube size: 7.0 mm Number of attempts: 1 Airway Equipment and Method: Stylet Placement Confirmation: ETT inserted through vocal cords under direct vision,  positive ETCO2 and breath sounds checked- equal and bilateral Secured at: 22 cm Tube secured with: Tape Dental Injury: Teeth and Oropharynx as per pre-operative assessment     Anesthesia Regional Block:  Adductor canal block  Pre-Anesthetic Checklist: ,, timeout performed, Correct Patient, Correct Site, Correct Laterality, Correct Procedure, Correct Position, site marked, Risks and benefits discussed,  Surgical consent,  Pre-op evaluation,  At surgeon's request and post-op pain management  Laterality: Left and Lower  Prep: chloraprep       Needles:  Injection technique: Single-shot  Needle Type: Echogenic Needle     Needle Length: 9cm 9 cm Needle Gauge: 21 and 21 G    Additional Needles:  Procedures: ultrasound guided (picture in chart) Adductor canal block Narrative:  Start time: 09/13/2014 12:50 PM End time: 09/13/2014 12:56 PM Injection made incrementally with aspirations every 5 mL.  Performed by: Personally  Anesthesiologist: Maneh Sieben

## 2014-09-13 NOTE — Anesthesia Preprocedure Evaluation (Signed)
Anesthesia Evaluation  Patient identified by MRN, date of birth, ID band Patient awake    Reviewed: Allergy & Precautions, NPO status , Patient's Chart, lab work & pertinent test results  Airway Mallampati: I  TM Distance: >3 FB Neck ROM: Full    Dental  (+) Teeth Intact, Dental Advisory Given   Pulmonary  breath sounds clear to auscultation        Cardiovascular hypertension, Pt. on medications Rhythm:Regular Rate:Normal     Neuro/Psych    GI/Hepatic GERD-  Medicated and Controlled,  Endo/Other    Renal/GU      Musculoskeletal   Abdominal   Peds  Hematology   Anesthesia Other Findings   Reproductive/Obstetrics                             Anesthesia Physical Anesthesia Plan  ASA: II  Anesthesia Plan: Spinal   Post-op Pain Management:    Induction: Intravenous  Airway Management Planned: Simple Face Mask  Additional Equipment:   Intra-op Plan:   Post-operative Plan:   Informed Consent: I have reviewed the patients History and Physical, chart, labs and discussed the procedure including the risks, benefits and alternatives for the proposed anesthesia with the patient or authorized representative who has indicated his/her understanding and acceptance.   Dental advisory given  Plan Discussed with: CRNA, Anesthesiologist and Surgeon  Anesthesia Plan Comments:         Anesthesia Quick Evaluation

## 2014-09-13 NOTE — Progress Notes (Signed)
Utilization review completed.  

## 2014-09-13 NOTE — Progress Notes (Signed)
Orthopedic Tech Progress Note Patient Details:  Kelsey Wilson 02/04/1961 [id number]  Ortho Devices Ortho Device/Splint Location: applied overhead frame to bed Ortho Device/Splint Interventions: Ordered, Application   Braulio Bosch 09/13/2014, 9:30 PM

## 2014-09-14 ENCOUNTER — Encounter (HOSPITAL_COMMUNITY): Payer: Self-pay | Admitting: General Practice

## 2014-09-14 LAB — BASIC METABOLIC PANEL
Anion gap: 11 (ref 5–15)
BUN: 14 mg/dL (ref 6–23)
CO2: 28 mmol/L (ref 19–32)
Calcium: 9 mg/dL (ref 8.4–10.5)
Chloride: 99 mmol/L (ref 96–112)
Creatinine, Ser: 0.82 mg/dL (ref 0.50–1.10)
GFR calc Af Amer: >90 mL/min (ref >90)
GFR calc non Af Amer: 79 mL/min — ABNORMAL LOW (ref >90)
Glucose, Bld: 119 mg/dL — ABNORMAL HIGH (ref 70–99)
Potassium: 3.5 mmol/L (ref 3.5–5.1)
Sodium: 138 mmol/L (ref 135–145)

## 2014-09-14 LAB — CBC
HCT: 34.5 % — ABNORMAL LOW (ref 36.0–46.0)
Hemoglobin: 11.3 g/dL — ABNORMAL LOW (ref 12.0–15.0)
MCH: 28.3 pg (ref 26.0–34.0)
MCHC: 32.8 g/dL (ref 30.0–36.0)
MCV: 86.5 fL (ref 78.0–100.0)
Platelets: 164 10*3/uL (ref 150–400)
RBC: 3.99 MIL/uL (ref 3.87–5.11)
RDW: 14.1 % (ref 11.5–15.5)
WBC: 7.8 10*3/uL (ref 4.0–10.5)

## 2014-09-14 MED ORDER — RIVAROXABAN 10 MG PO TABS: 10.0000 mg | ORAL_TABLET | Freq: Every day | ORAL | Status: DC

## 2014-09-14 MED ORDER — METHOCARBAMOL 500 MG PO TABS: 500.0000 mg | ORAL_TABLET | Freq: Four times a day (QID) | ORAL | Status: DC | PRN

## 2014-09-14 MED ORDER — ONDANSETRON HCL 4 MG PO TABS: 4.0000 mg | ORAL_TABLET | Freq: Four times a day (QID) | ORAL | Status: DC | PRN

## 2014-09-14 MED ORDER — HYDROCODONE-ACETAMINOPHEN 5-325 MG PO TABS: 1.0000 | ORAL_TABLET | ORAL | Status: DC | PRN

## 2014-09-14 NOTE — Care Management Note (Signed)
CARE MANAGEMENT NOTE 09/14/2014  Patient:  ANNEKA, STUDER   Account Number:  [id number]  Date Initiated:  09/14/2014  Documentation initiated by:  Ricki Miller  Subjective/Objective Assessment:   54 yr old female admitted with left knee osteoarthritis. Patient underwent a left total knee arthroplasty.     Action/Plan:   Case manager spoke with patient concerning home health and DME needs. Patient was preoperatively setup with [facility], no changes. Patient has support at discharge.   Anticipated DC Date:  09/15/2014   Anticipated DC Plan:  [facility]  CM consult      [place]   Choice offered to / List presented to:  C-1 Patient   DME arranged  [place]  3-N-1      DME agency  [place] arranged  [place]   Status of service:  Completed, signed off Medicare Important Message given?  NA - LOS <3 / Initial given by admissions (If response is "NO", the following Medicare IM given date fields will be blank) Date Medicare IM given:   Medicare IM given by:   Date Additional Medicare IM given:   Additional Medicare IM given by:    Discharge Disposition:  [place]  Per UR Regulation:  Reviewed for med. necessity/level of care/duration of stay

## 2014-09-14 NOTE — Discharge Instructions (Signed)
°Dr. Brian Swinteck °Total Joint Specialist °[facility] °[address] °[place], [place] [address] °[phone] ° °TOTAL KNEE REPLACEMENT POSTOPERATIVE DIRECTIONS ° ° ° °Knee Rehabilitation, Guidelines Following Surgery  °Results after knee surgery are often greatly improved when you follow the exercise, range of motion and muscle strengthening exercises prescribed by your doctor. Safety measures are also important to protect the knee from further injury. Any time any of these exercises cause you to have increased pain or swelling in your knee joint, decrease the amount until you are comfortable again and slowly increase them. If you have problems or questions, call your caregiver or physical therapist for advice.  ° °WEIGHT BEARING °Weight bearing as tolerated with assist device (walker, cane, etc) as directed, use it as long as suggested by your surgeon or therapist, typically at least 4-6 weeks. ° °HOME CARE INSTRUCTIONS  °Remove items at home which could result in a fall. This includes throw rugs or furniture in walking pathways.  °Continue medications as instructed at time of discharge. °You may have some home medications which will be placed on hold until you complete the course of blood thinner medication.  °You may start showering once you are discharged home but do not submerge the incision under water. Just pat the incision dry and apply a dry gauze dressing on daily. °Walk with walker as instructed.  °You may resume a sexual relationship in one month or when given the OK by your doctor.  °· Use walker as long as suggested by your caregivers. °· Avoid periods of inactivity such as sitting longer than an hour when not asleep. This helps prevent blood clots.  °You may put full weight on your legs and walk as much as is comfortable.  °You may return to work once you are cleared by your doctor.  °Do not drive a car for 6 weeks or until released by you surgeon.  °· Do not drive while  taking narcotics.  °Wear the elastic stockings for three weeks following surgery during the day but you may remove then at night. °Make sure you keep all of your appointments after your operation with all of your doctors and caregivers. You should call the office at the above phone number and make an appointment for approximately two weeks after the date of your surgery. °Do not remove your surgical dressing. The dressing is waterproof; you may take showers in 3 days, but do not take tub baths or submerge the dressing. °Please pick up a stool softener and laxative for home use as long as you are requiring pain medications. °· ICE to the affected knee every three hours for 30 minutes at a time and then as needed for pain and swelling.  Continue to use ice on the knee for pain and swelling from surgery. You may notice swelling that will progress down to the foot and ankle.  This is normal after surgery.  Elevate the leg when you are not up walking on it.   °It is important for you to complete the blood thinner medication as prescribed by your doctor. °· Continue to use the breathing machine which will help keep your temperature down.  It is common for your temperature to cycle up and down following surgery, especially at night when you are not up moving around and exerting yourself.  The breathing machine keeps your lungs expanded and your temperature down. ° °RANGE OF MOTION AND STRENGTHENING EXERCISES  °Rehabilitation of the knee is important following a   knee injury or an operation. After just a few days of immobilization, the muscles of the thigh which control the knee become weakened and shrink (atrophy). Knee exercises are designed to build up the tone and strength of the thigh muscles and to improve knee motion. Often times heat used for twenty to thirty minutes before working out will loosen up your tissues and help with improving the range of motion but do not use heat for the first two weeks following  surgery. These exercises can be done on a training (exercise) mat, on the floor, on a table or on a bed. Use what ever works the best and is most comfortable for you Knee exercises include:  Leg Lifts - While your knee is still immobilized in a splint or cast, you can do straight leg raises. Lift the leg to 60 degrees, hold for 3 sec, and slowly lower the leg. Repeat 10-20 times 2-3 times daily. Perform this exercise against resistance later as your knee gets better.  Quad and Hamstring Sets - Tighten up the muscle on the front of the thigh (Quad) and hold for 5-10 sec. Repeat this 10-20 times hourly. Hamstring sets are done by pushing the foot backward against an object and holding for 5-10 sec. Repeat as with quad sets.  A rehabilitation program following serious knee injuries can speed recovery and prevent re-injury in the future due to weakened muscles. Contact your doctor or a physical therapist for more information on knee rehabilitation.   SKILLED REHAB INSTRUCTIONS: If the patient is transferred to a skilled rehab facility following release from the hospital, a list of the current medications will be sent to the facility for the patient to continue.  When discharged from the skilled rehab facility, please have the facility set up the patient's [place] prior to being released. Also, the skilled facility will be responsible for providing the patient with their medications at time of release from the facility to include their pain medication, the muscle relaxants, and their blood thinner medication. If the patient is still at the rehab facility at time of the two week follow up appointment, the skilled rehab facility will also need to assist the patient in arranging follow up appointment in our office and any transportation needs.  MAKE SURE YOU:  Understand these instructions.  Will watch your condition.  Will get help right away if you are not doing well or get worse.     Pick up stool softner and laxative for home use following surgery while on pain medications. Do NOT remove your dressing. You may shower.  Do not take tub baths or submerge incision under water. May shower starting three days after surgery. Please use a clean towel to pat the incision dry following showers. Continue to use ice for pain and swelling after surgery. Do not use any lotions or creams on the incision until instructed by your surgeon.  ----------------------------------------------------------------------------------------------------------------------------------------------------------  Information on my medicine - XARELTO (Rivaroxaban)  This medication education was reviewed with me or my healthcare representative as part of my discharge preparation.  The pharmacist that spoke with me during my hospital stay was:  Emmani Lesueur, RPH  Why was Xarelto prescribed for you? Xarelto was prescribed for you to reduce the risk of blood clots forming after orthopedic surgery. The medical term for these abnormal blood clots is venous thromboembolism (VTE).  What do you need to know about xarelto ? Take your Xarelto ONCE DAILY at the same time every  day. You may take it either with or without food.  If you have difficulty swallowing the tablet whole, you may crush it and mix in applesauce just prior to taking your dose.  Take Xarelto exactly as prescribed by your doctor and DO NOT stop taking Xarelto without talking to the doctor who prescribed the medication.  Stopping without other VTE prevention medication to take the place of Xarelto may increase your risk of developing a clot.  After discharge, you should have regular check-up appointments with your healthcare provider that is prescribing your Xarelto.    What do you do if you miss a dose? If you miss a dose, take it as soon as you remember on the same day then continue your regularly scheduled once daily regimen the  next day. Do not take two doses of Xarelto on the same day.   Important Safety Information A possible side effect of Xarelto is bleeding. You should call your healthcare provider right away if you experience any of the following: ? Bleeding from an injury or your nose that does not stop. ? Unusual colored urine (red or dark brown) or unusual colored stools (red or black). ? Unusual bruising for unknown reasons. ? A serious fall or if you hit your head (even if there is no bleeding).  Some medicines may interact with Xarelto and might increase your risk of bleeding while on Xarelto. To help avoid this, consult your healthcare provider or pharmacist prior to using any new prescription or non-prescription medications, including herbals, vitamins, non-steroidal anti-inflammatory drugs (NSAIDs) and supplements.  This website has more information on Xarelto: [email or web address]

## 2014-09-14 NOTE — Progress Notes (Signed)
   Subjective:  Patient reports pain as mild to moderate.  No c/o. Denies N/V/CP/SOB.  Objective:   VITALS:   Filed Vitals:   09/13/14 2353 09/14/14 0042 09/14/14 0400 09/14/14 0501  BP:  90/52  101/56  Pulse:  61  63  Temp:  98.7 F (37.1 C)  97.8 F (36.6 C)  TempSrc:  Oral    Resp: 14 14 16 16   Height:      Weight:      SpO2: 100% 100% 100% 100%    ABD soft Sensation intact distally Intact pulses distally Dorsiflexion/Plantar flexion intact Incision: dressing C/D/I Compartment soft HV ss  Lab Results  Component Value Date   WBC 3.4* 09/02/2014   HGB 12.9 09/02/2014   HCT 38.4 09/02/2014   MCV 85.1 09/02/2014   PLT 182 09/02/2014   BMET    Component Value Date/Time   NA 139 09/02/2014 1044   NA 141 06/10/2014 1416   K 3.6 09/02/2014 1044   K 3.3* 06/10/2014 1416   CL 100 09/02/2014 1044   CL 101 09/18/2012 0853   CO2 26 09/02/2014 1044   CO2 31* 06/10/2014 1416   GLUCOSE 101* 09/02/2014 1044   GLUCOSE 93 06/10/2014 1416   GLUCOSE 94 09/18/2012 0853   BUN 12 09/02/2014 1044   BUN 17.9 06/10/2014 1416   CREATININE 0.73 09/02/2014 1044   CREATININE 0.8 06/10/2014 1416   CALCIUM 9.8 09/02/2014 1044   CALCIUM 9.5 06/10/2014 1416   GFRNONAA >90 09/02/2014 1044   GFRAA >90 09/02/2014 1044     Assessment/Plan: 1 Day Post-Op   Principal Problem:   Primary osteoarthritis of left knee  WBAT with walker PO pain control DVT ppx: xarelto, foot pumps, TEDs Advance diet Up with therapy Plan for discharge tomorrow Discharge home with home health    Kelsey Wilson, Kelsey Wilson 09/14/2014, 6:39 AM   Rod Can, MD Cell [phone]

## 2014-09-14 NOTE — Evaluation (Signed)
Physical Therapy Evaluation Patient Details Name: Kelsey Wilson MRN: [id number] DOB: 10-31-1960 Today's Date: 09/14/2014   History of Present Illness  Patient is a 54 y/o female s/p left TKA.  PMH positive for Breast Cancer L s/p axillary lymph node dissection and lymphedema, fibromyalgia, arthritis, and chemo assoc neuropathy.  Clinical Impression  Patient presents with decreased independence with mobility due to deficits listed in PT problem list.  She will benefit from skilled PT in the acute setting to allow return home with family assist and HHPT.    Follow Up Recommendations Home health PT;Supervision for mobility/OOB    Equipment Recommendations  Rolling walker with 5" wheels (may need youth size)   Recommendations for Other Services       Precautions / Restrictions Precautions Precautions: Fall;Knee Restrictions LLE Weight Bearing: Weight bearing as tolerated      Mobility  Bed Mobility Overal bed mobility: Needs Assistance Bed Mobility: Supine to Sit     Supine to sit: Min assist     General bed mobility comments: assist for left LE  Transfers Overall transfer level: Needs assistance Equipment used: Rolling walker (2 wheeled) Transfers: Sit to/from Stand Sit to Stand: Min guard         General transfer comment: for safety cues for technique  Ambulation/Gait Ambulation/Gait assistance: Min guard;Supervision Ambulation Distance (Feet): 70 Feet Assistive device: Rolling walker (2 wheeled) Gait Pattern/deviations: Step-through pattern;Step-to pattern;Decreased stride length;Antalgic     General Gait Details: min antalgia on left, walker slightly tall for patient at lowest level  Stairs            Wheelchair Mobility    Modified Rankin (Stroke Patients Only)       Balance Overall balance assessment: Needs assistance   Sitting balance-Leahy Scale: Good     Standing balance support: No upper extremity supported;During functional  activity Standing balance-Leahy Scale: Fair Standing balance comment: able to stand and wash hands at sink without loss of balance; walker needed for ambulation                              Pertinent Vitals/Pain Pain Assessment: 0-10 Pain Score: 6  Pain Location: left knee Pain Intervention(s): Monitored during session;Ice applied;Repositioned    Home Living Family/patient expects to be discharged to:: Private residence Living Arrangements: Children Available Help at Discharge: Family;Available 24 hours/day Type of Home: Apartment Home Access: Stairs to enter Entrance Stairs-Rails: Psychiatric nurse of Steps: 6 Home Layout: One level Home Equipment: Cane - single point      Prior Function Level of Independence: Independent         Comments: occcasionally used cane; on disability since breast CA.     Hand Dominance        Extremity/Trunk Assessment               Lower Extremity Assessment: LLE deficits/detail   LLE Deficits / Details: ankle AROM WFL, knee extension at least 3/5, hip flexion 3-/5, AROM grossly knee flexion 35-40      Communication   Communication: No difficulties  Cognition Arousal/Alertness: Awake/alert Behavior During Therapy: WFL for tasks assessed/performed Overall Cognitive Status: Within Functional Limits for tasks assessed                      General Comments      Exercises Total Joint Exercises Ankle Circles/Pumps: AROM;Both;10 reps;Supine Quad Sets: AROM;Both;Supine;10 reps Short Arc Quad: AROM;Left;10 reps;Supine  Heel Slides: AAROM;Left;10 reps      Assessment/Plan    PT Assessment Patient needs continued PT services  PT Diagnosis Difficulty walking;Acute pain   PT Problem List Decreased strength;Decreased range of motion;Decreased activity tolerance;Decreased balance;Decreased mobility;Pain;Decreased knowledge of use of DME  PT Treatment Interventions DME instruction;Balance  training;Gait training;Stair training;Functional mobility training;Patient/family education;Therapeutic activities;Therapeutic exercise   PT Goals (Current goals can be found in the Care Plan section) Acute Rehab PT Goals Patient Stated Goal: To go home PT Goal Formulation: With patient Time For Goal Achievement: 09/18/14 Potential to Achieve Goals: Good    Frequency 7X/week   Barriers to discharge        Co-evaluation               End of Session Equipment Utilized During Treatment: Gait belt Activity Tolerance: Patient limited by fatigue Patient left: in chair;with call bell/phone within reach           Time: 0910-0937 PT Time Calculation (min) (ACUTE ONLY): 27 min   Charges:   PT Evaluation $Initial PT Evaluation Tier I: 1 Procedure PT Treatments $Gait Training: 8-22 mins   PT G Codes:        Damaria Vachon,CYNDI 10/01/2014, 12:37 PM  Magda Kiel, Keeler 10/01/14

## 2014-09-14 NOTE — Discharge Summary (Signed)
Physician Discharge Summary  Patient ID: Kelsey Wilson MRN: [id number] DOB/AGE: November 28, 1960 54 y.o.  Admit date: 09/13/2014 Discharge date: 09/15/2014  Admission Diagnoses:  Primary osteoarthritis of left knee  Discharge Diagnoses:  Principal Problem:   Primary osteoarthritis of left knee   Past Medical History  Diagnosis Date  . Night sweats   . Fatigue   . Wears glasses   . Arthritis   . Hot flashes   . Blood transfusion   . Diverticulitis of colon   . Headache(784.0)   . Anemia   . Bursitis   . Carpal tunnel syndrome   . Fibromyalgia 08/2012  . Breast cancer     T3N1 invasive ductal carcinoma left breast  . SOB (shortness of breath) on exertion     walking and stairs  . Pneumonia     hx of  . Anxiety   . Depression   . GERD (gastroesophageal reflux disease)     Surgeries: Procedure(s): LEFT TOTAL KNEE ARTHROPLASTY on 09/13/2014   Consultants (if any):    Discharged Condition: Improved  Hospital Course: Kelsey Wilson is an 54 y.o. female who was admitted 09/13/2014 with a diagnosis of Primary osteoarthritis of left knee and went to the operating room on 09/13/2014 and underwent the above named procedures.    She was given perioperative antibiotics:      Anti-infectives    Start     Dose/Rate Route Frequency Ordered Stop   09/13/14 1900  ceFAZolin (ANCEF) IVPB 2 g/50 mL premix     2 g 100 mL/hr over 30 Minutes Intravenous Every 6 hours 09/13/14 1851 09/14/14 0628   09/13/14 0600  ceFAZolin (ANCEF) IVPB 2 g/50 mL premix     2 g 100 mL/hr over 30 Minutes Intravenous On call to O.R. 09/12/14 1314 09/13/14 1330    .  She was given sequential compression devices, early ambulation, and xarelto for DVT prophylaxis.  She benefited maximally from the hospital stay and there were no complications.    Recent vital signs:  Filed Vitals:   09/15/14 0447  BP: 108/59  Pulse: 72  Temp: 98.2 F (36.8 C)  Resp: 18    Recent laboratory studies:  Lab Results   Component Value Date   HGB 11.4* 09/15/2014   HGB 11.3* 09/14/2014   HGB 12.9 09/02/2014   Lab Results  Component Value Date   WBC 9.8 09/15/2014   PLT 164 09/15/2014   Lab Results  Component Value Date   INR 0.90 09/02/2014   Lab Results  Component Value Date   NA 138 09/14/2014   K 3.5 09/14/2014   CL 99 09/14/2014   CO2 28 09/14/2014   BUN 14 09/14/2014   CREATININE 0.82 09/14/2014   GLUCOSE 119* 09/14/2014    Discharge Medications:     Medication List    STOP taking these medications        diclofenac sodium 1 % Gel  Commonly known as:  VOLTAREN      TAKE these medications        acetaminophen 500 MG tablet  Commonly known as:  TYLENOL  Take 500 mg by mouth as needed for pain.     ALPRAZolam 0.5 MG tablet  Commonly known as:  XANAX  Take 0.25 mg by mouth at bedtime as needed for anxiety or sleep.     anastrozole 1 MG tablet  Commonly known as:  ARIMIDEX  TAKE 1 TABLET BY MOUTH ONCE DAILY     HYDROcodone-acetaminophen 5-325  MG per tablet  Commonly known as:  NORCO/VICODIN  Take 1-2 tablets by mouth every 4 (four) hours as needed (breakthrough pain).     LYRICA 100 MG capsule  Generic drug:  pregabalin  TAKE 1 CAPSULE BY MOUTH THREE TIMES A DAY     methocarbamol 500 MG tablet  Commonly known as:  ROBAXIN  Take 1 tablet (500 mg total) by mouth every 6 (six) hours as needed for muscle spasms.     omeprazole 20 MG capsule  Commonly known as:  PRILOSEC  Take 20 mg by mouth daily.     ondansetron 4 MG tablet  Commonly known as:  ZOFRAN  Take 1 tablet (4 mg total) by mouth every 6 (six) hours as needed for nausea.     prednisoLONE acetate 1 % ophthalmic suspension  Commonly known as:  PRED FORTE  Place 1 drop into the left eye 4 (four) times daily.     propranolol 80 MG tablet  Commonly known as:  INDERAL  Take 1 tablet by mouth 2 (two) times daily. For migraine prevention     rivaroxaban 10 MG Tabs tablet  Commonly known as:  XARELTO  Take  1 tablet (10 mg total) by mouth daily with breakfast.     tiZANidine 4 MG tablet  Commonly known as:  ZANAFLEX  Take 1 tablet (4 mg total) by mouth every 8 (eight) hours as needed for muscle spasms.     traMADol 50 MG tablet  Commonly known as:  ULTRAM  TAKE 1 TABLET BY MOUTH EVERY 6 HOURS AS NEEDED FOR PAIN, must last 30 days.     triamterene-hydrochlorothiazide 37.5-25 MG per tablet  Commonly known as:  MAXZIDE-25  Take 1 tablet by mouth daily.     UNABLE TO FIND  - Rx: Q5956- Non-Silicone Breast Prosthesis (Quantity: 1)  - Dx: 174.9; Left Breast Lumpectomy     venlafaxine XR 37.5 MG 24 hr capsule  Commonly known as:  EFFEXOR-XR  Take 1 capsule (37.5 mg total) by mouth daily with breakfast.     Vitamin D 2000 UNITS Caps  Take 1 capsule by mouth daily.        Diagnostic Studies: Dg Knee 1-2 Views Left  10/11/14   CLINICAL DATA:  Post total knee arthroplasty.  EXAM: LEFT KNEE - 1-2 VIEW  COMPARISON:  None.  FINDINGS: Right knee arthroplasty in expected alignment. No periprosthetic lucency or fracture. There is been patellar resurfacing. Recent postsurgical change includes surgical drain, air in the soft tissues and joint, and soft tissue edema.  IMPRESSION: Post right knee arthroplasty without immediate postoperative complication.   Electronically Signed   By: Jeb Levering M.D.   On: Oct 11, 2014 18:44    Disposition: 01-Home or Self Care  Discharge Instructions    Call MD / Call 911    Complete by:  As directed   If you experience chest pain or shortness of breath, CALL 911 and be transported to the hospital emergency room.  If you develope a fever above 101 F, pus (white drainage) or increased drainage or redness at the wound, or calf pain, call your surgeon's office.     Constipation Prevention    Complete by:  As directed   Drink plenty of fluids.  Prune juice may be helpful.  You may use a stool softener, such as Colace (over the counter) 100 mg twice a day.  Use  MiraLax (over the counter) for constipation as needed.     Diet -  low sodium heart healthy    Complete by:  As directed      Driving restrictions    Complete by:  As directed   No driving for 6 weeks     Increase activity slowly as tolerated    Complete by:  As directed      Lifting restrictions    Complete by:  As directed   No lifting for 6 weeks     TED hose    Complete by:  As directed   Use stockings (TED hose) for 3 weeks on both leg(s).  You may remove them at night for sleeping.           Follow-up Information    Follow up with Sonya Gunnoe, Horald Pollen, MD. Schedule an appointment as soon as possible for a visit in 2 weeks.   Specialty:  Orthopedic Surgery   Why:  For wound re-check   Contact information:   [address] [address] [phone]       Follow up with [facility].   Why:  Someone from [facility] will contact you concerning start date and time for physical therapy.   Contact information:   [address] [place] [address] [phone]        Signed: Elie Goody 09/15/2014, 7:29 AM

## 2014-09-14 NOTE — Progress Notes (Signed)
Physical Therapy Treatment Patient Details Name: Kelsey Wilson MRN: [id number] DOB: [birth year] Today's Date: 09/14/2014    History of Present Illness Patient is a 54 y/o female s/p left TKA.  PMH positive for Breast Cancer L s/p axillary lymph node dissection and lymphedema, fibromyalgia, arthritis, and chemo assoc neuropathy.    PT Comments    Patient progressing with mobility; farther ambulation this pm.  Will need extra time to rehab at home due to limited activity tolerance s/p  Cancer treatements  Will continue as tolerated.  Follow Up Recommendations  Home health PT;Supervision for mobility/OOB     Equipment Recommendations  Rolling walker with 5" wheels    Recommendations for Other Services       Precautions / Restrictions Precautions Precautions: Fall;Knee Restrictions LLE Weight Bearing: Weight bearing as tolerated    Mobility  Bed Mobility Overal bed mobility: Needs Assistance Bed Mobility: Sit to Supine     Supine to sit: Min assist Sit to supine: Supervision   General bed mobility comments: able to lift leg into bed  Transfers Overall transfer level: Needs assistance Equipment used: Rolling walker (2 wheeled) Transfers: Sit to/from Stand Sit to Stand: Supervision;Min guard         General transfer comment: education in technique for car transfers as well  Ambulation/Gait Ambulation/Gait assistance: Supervision Ambulation Distance (Feet): 110 Feet (x 2) Assistive device: Rolling walker (2 wheeled) Gait Pattern/deviations: Step-through pattern;Step-to pattern;Decreased stride length;Antalgic     General Gait Details: slower speed, continues with short steps and antalgic pattern on left   Stairs            Wheelchair Mobility    Modified Rankin (Stroke Patients Only)       Balance Overall balance assessment: Needs assistance   Sitting balance-Leahy Scale: Good     Standing balance support: No upper extremity supported;During  functional activity Standing balance-Leahy Scale: Fair Standing balance comment: able to stand and wash hands at sink without loss of balance; walker needed for ambulation                     Cognition Arousal/Alertness: Awake/alert Behavior During Therapy: WFL for tasks assessed/performed Overall Cognitive Status: Within Functional Limits for tasks assessed                      Exercises Total Joint Exercises Ankle Circles/Pumps: AROM;Both;10 reps;Supine Quad Sets: AROM;Both;Supine;10 reps Short Arc Quad: AROM;Left;10 reps;Supine Heel Slides: AAROM;Left;10 reps    General Comments        Pertinent Vitals/Pain Pain Score: 6  Pain Location: lt knee Pain Intervention(s): Monitored during session;Ice applied;Repositioned    Home Living                      Prior Function            PT Goals (current goals can now be found in the care plan section) Acute Rehab PT Goals Patient Stated Goal: To go home PT Goal Formulation: With patient Time For Goal Achievement: 09/18/14 Potential to Achieve Goals: Good Progress towards PT goals: Progressing toward goals    Frequency  7X/week    PT Plan Current plan remains appropriate    Co-evaluation             End of Session Equipment Utilized During Treatment: Gait belt Activity Tolerance: Patient tolerated treatment well Patient left: in chair;with call bell/phone within reach     Time: 6269-4854 PT Time  Calculation (min) (ACUTE ONLY): 17 min  Charges:  $Gait Training: 8-22 mins                    G Codes:      Kelsey Wilson,Kelsey Wilson 10/09/14, 3:28 PM  Kelsey Wilson, Kelsey Wilson 2014-10-09

## 2014-09-14 NOTE — Evaluation (Signed)
Occupational Therapy Evaluation Patient Details Name: Kelsey Wilson MRN: [id number] DOB: 10/12/1960 Today's Date: 09/14/2014    History of Present Illness Patient is a 54 y/o female s/p left TKA.  PMH positive for Breast Cancer L s/p axillary lymph node dissection and lymphedema, fibromyalgia, arthritis, and chemo assoc neuropathy.   Clinical Impression   Pt s/p above. Pt independent with ADLs, PTA. Feel pt will benefit from acute OT to increase independence with ADLs, prior to d/c. Plan to practice LB dressing and possibly tub transfer next session.    Follow Up Recommendations  No OT follow up;Supervision - Intermittent    Equipment Recommendations  Other (comment) (may benefit from AE)    Recommendations for Other Services       Precautions / Restrictions Precautions Precautions: Fall;Knee Precaution Booklet Issued: No Precaution Comments: educated on knee precautions Restrictions Weight Bearing Restrictions: Yes LLE Weight Bearing: Weight bearing as tolerated      Mobility Bed Mobility Overal bed mobility: Needs Assistance Bed Mobility: Supine to Sit     Supine to sit: Min assist General bed mobility comments: assist with left leg. Explained that she may can use sheet to assist it.  Transfers Overall transfer level: Needs assistance Transfers: Sit to/from Stand Sit to Stand: Min guard         General transfer comment: cue to control descent to chair and cue for positioning of LLE.    Balance Min guard for ambulation with RW.                               ADL Overall ADL's : Needs assistance/impaired     Grooming: Wash/dry hands;Set up;Supervision/safety;Standing               Lower Body Dressing: Moderate assistance;Sit to/from stand   Toilet Transfer: Min guard;Ambulation;RW (3 in 1 over commode)   Toileting- Clothing Manipulation and Hygiene: Min guard;Sit to/from stand       Functional mobility during ADLs: Min  guard;Rolling walker General ADL Comments: Educated on LB dressing technique. Explained that AE is available to assist with LB ADLs. Educated on tub transfer techniques and options for shower chair. Educated on safety such as safe shoewear, use of bag on walker, rugs/items on floor, and recommended someone be with her for tub transfer (pt unsure if she will sponge bathe or get in tub). Explained benefit of reaching down to left foot for LB dressing as it allows knee to bend.     Vision     Perception     Praxis      Pertinent Vitals/Pain Pain Assessment: 0-10 Pain Score: 9  Pain Location: left knee Pain Intervention(s): Monitored during session;Repositioned;Other (comment) (nurse brought in pain meds)     Hand Dominance     Extremity/Trunk Assessment Upper Extremity Assessment Upper Extremity Assessment: Overall WFL for tasks assessed   Lower Extremity Assessment Lower Extremity Assessment: Defer to PT evaluation       Communication Communication Communication: No difficulties   Cognition Arousal/Alertness: Awake/alert Behavior During Therapy: WFL for tasks assessed/performed Overall Cognitive Status: Within Functional Limits for tasks assessed                     General Comments       Exercises       Shoulder Instructions      Home Living Family/patient expects to be discharged to:: Private residence Living Arrangements: Children Available  Help at Discharge: Family;Available 24 hours/day Type of Home: Apartment Home Access: Stairs to enter Entrance Stairs-Number of Steps: 6 Entrance Stairs-Rails: Right;Left Home Layout: One level     Bathroom Shower/Tub: Tub/shower unit         Home Equipment: Cane - single point;Bedside commode;Walker - 2 wheels          Prior Functioning/Environment Level of Independence: Independent        Comments: occcasionally used cane; on disability since breast CA.    OT Diagnosis: Acute pain   OT Problem  List: Decreased range of motion;Decreased activity tolerance;Decreased knowledge of use of DME or AE;Decreased knowledge of precautions;Pain;Obesity   OT Treatment/Interventions: Self-care/ADL training;DME and/or AE instruction;Therapeutic activities;Patient/family education;Balance training    OT Goals(Current goals can be found in the care plan section) Acute Rehab OT Goals Patient Stated Goal: not stated OT Goal Formulation: With patient Time For Goal Achievement: 09/21/14 Potential to Achieve Goals: Good ADL Goals Pt Will Perform Lower Body Dressing: with set-up;with supervision;with adaptive equipment;sit to/from stand Pt Will Transfer to Toilet: with modified independence;ambulating (3 in 1 over commode) Pt Will Perform Tub/Shower Transfer: Tub transfer;with supervision;ambulating;3 in 1;rolling walker  OT Frequency: Min 2X/week   Barriers to D/C:            Co-evaluation              End of Session Equipment Utilized During Treatment: Gait belt;Rolling walker Nurse Communication: Other (comment) (pt nauseous)  Activity Tolerance: Patient limited by pain;Other (comment) (nauseous) Patient left: in chair;with call bell/phone within reach;with nursing/sitter in room   Time: 1658-1716 OT Time Calculation (min): 18 min Charges:  OT General Charges $OT Visit: 1 Procedure OT Evaluation $Initial OT Evaluation Tier I: 1 Procedure G-CodesBenito Mccreedy OTR/L [phone] 09/14/2014, 5:32 PM

## 2014-09-15 LAB — CBC
HCT: 33.1 % — ABNORMAL LOW (ref 36.0–46.0)
Hemoglobin: 11.4 g/dL — ABNORMAL LOW (ref 12.0–15.0)
MCH: 29.5 pg (ref 26.0–34.0)
MCHC: 34.4 g/dL (ref 30.0–36.0)
MCV: 85.8 fL (ref 78.0–100.0)
Platelets: 164 10*3/uL (ref 150–400)
RBC: 3.86 MIL/uL — ABNORMAL LOW (ref 3.87–5.11)
RDW: 14.5 % (ref 11.5–15.5)
WBC: 9.8 10*3/uL (ref 4.0–10.5)

## 2014-09-15 NOTE — Progress Notes (Signed)
Physical Therapy Treatment Patient Details Name: Kelsey Wilson MRN: [id number] DOB: March 08, 1961 Today's Date: 09/15/2014    History of Present Illness Patient is a 54 y/o female s/p left TKA.  PMH positive for Breast Cancer L s/p axillary lymph node dissection and lymphedema, fibromyalgia, arthritis, and chemo assoc neuropathy.    PT Comments    Patient is progressing well towards physical therapy goals, ambulating up to 180 feet with supervision for safety. Completed stair training and states she feels confident with this task. Reports her daughter will be able to care for her as needed at d/c. Patient will continue to benefit from skilled physical therapy services at home with HHPT to further improve independence with functional mobility.    Follow Up Recommendations  Home health PT;Supervision for mobility/OOB     Equipment Recommendations  Rolling walker with 5" wheels    Recommendations for Other Services       Precautions / Restrictions Precautions Precautions: Fall;Knee Precaution Comments: Reviewed knee precautions Restrictions Weight Bearing Restrictions: Yes LLE Weight Bearing: Weight bearing as tolerated    Mobility  Bed Mobility Overal bed mobility: Needs Assistance Bed Mobility: Supine to Sit     Supine to sit: Supervision     General bed mobility comments: supervision for safety. Able to move LLE independently with extra time. She did reach for trapeze bar however.  Transfers Overall transfer level: Needs assistance Equipment used: Rolling walker (2 wheeled) Transfers: Sit to/from Stand Sit to Stand: Supervision         General transfer comment: Supervision for safety. Cues for hand placement and to use LLE as able. No physical assist required.  Ambulation/Gait Ambulation/Gait assistance: Supervision Ambulation Distance (Feet): 180 Feet Assistive device: Rolling walker (2 wheeled) Gait Pattern/deviations: Step-through pattern;Decreased step  length - right;Decreased stance time - left;Decreased stride length;Antalgic;Trunk flexed Gait velocity: decreased   General Gait Details: Demonstrates ability to control RW appropriately. VC for upright posture and to extend Lt knee in stance phase for quad activation. No buckling noted but does rely heavily on RW.     Stairs Stairs: Yes Stairs assistance: Min guard Stair Management: One rail Left;Step to pattern;Sideways Number of Stairs: 3 (x2) General stair comments: Educated on safe stair navigation technique. Used rail on Lt similar to home enviornment. States she feels confident witht his task.  Wheelchair Mobility    Modified Rankin (Stroke Patients Only)       Balance                                    Cognition Arousal/Alertness: Awake/alert Behavior During Therapy: WFL for tasks assessed/performed Overall Cognitive Status: Within Functional Limits for tasks assessed                      Exercises Total Joint Exercises Ankle Circles/Pumps: AROM;Both;10 reps;Seated Quad Sets: AROM;Left;10 reps;Seated Long Arc Quad: AROM;Strengthening;Left;10 reps;Seated Knee Flexion: AROM;Left;10 reps;Seated    General Comments        Pertinent Vitals/Pain Pain Assessment: 0-10 Pain Score: 8  Pain Location: Lt knee Pain Descriptors / Indicators: Aching Pain Intervention(s): Monitored during session;Repositioned    Home Living                      Prior Function            PT Goals (current goals can now be found in the care plan  section) Acute Rehab PT Goals PT Goal Formulation: With patient Time For Goal Achievement: 09/18/14 Potential to Achieve Goals: Good Progress towards PT goals: Progressing toward goals    Frequency  7X/week    PT Plan Current plan remains appropriate    Co-evaluation             End of Session Equipment Utilized During Treatment: Gait belt Activity Tolerance: Patient tolerated treatment  well Patient left: in chair;with call bell/phone within reach     Time: 0913-0932 PT Time Calculation (min) (ACUTE ONLY): 19 min  Charges:  $Gait Training: 8-22 mins                    G Codes:      Ellouise Newer 2014/10/06, 10:29 AM Elayne Snare, [place]

## 2014-09-15 NOTE — Progress Notes (Signed)
Physical Therapy Treatment Patient Details Name: Kelsey Wilson MRN: [id number] DOB: 06-Aug-1960 Today's Date: 09/15/2014    History of Present Illness Patient is a 54 y/o female s/p left TKA.  PMH positive for Breast Cancer L s/p axillary lymph node dissection and lymphedema, fibromyalgia, arthritis, and chemo assoc neuropathy.    PT Comments    Good progress, moving more independently, ambulates 90 feet x2 this afternoon with no evidence of LLE buckling. Reviewed knee precautions and exercises. States she feels confident after stair training with PT this AM. Has no further concerns at this time. Adequate for d/c from PT standpoint when medically ready. Patient will continue to benefit from skilled physical therapy services at home with HHPT to further improve independence with functional mobility.   Follow Up Recommendations  Home health PT;Supervision for mobility/OOB     Equipment Recommendations  Rolling walker with 5" wheels    Recommendations for Other Services       Precautions / Restrictions Precautions Precautions: Fall;Knee Precaution Comments: Reviewed knee precautions Restrictions Weight Bearing Restrictions: Yes LLE Weight Bearing: Weight bearing as tolerated    Mobility  Bed Mobility Overal bed mobility: Needs Assistance Bed Mobility: Supine to Sit;Sit to Supine     Supine to sit: Supervision Sit to supine: Supervision   General bed mobility comments: Supervision for safety. Cues for technique and to use RLE as support. Did not require trapeze. Slow to move. Able to enter/exit bed without physical assist.  Transfers Overall transfer level: Modified independent               General transfer comment: performed from lowest bed setting. BSC, and low bench in hallway. Did not require cues or assist.  Ambulation/Gait Ambulation/Gait assistance: Supervision Ambulation Distance (Feet): 90 Feet (x2) Assistive device: Rolling walker (2 wheeled) Gait  Pattern/deviations: Step-through pattern;Decreased step length - right;Decreased stance time - left;Antalgic;Trunk flexed Gait velocity: decreased   General Gait Details: Improved postural awareness. Cues for Lt knee extension in stance phase for quad activation and terminal knee extension. No buckling noted. Required one seated rest break at 90 feet due to fatigue and nausea.   Stairs            Wheelchair Mobility    Modified Rankin (Stroke Patients Only)       Balance                                    Cognition Arousal/Alertness: Awake/alert Behavior During Therapy: WFL for tasks assessed/performed Overall Cognitive Status: Within Functional Limits for tasks assessed                      Exercises Total Joint Exercises Ankle Circles/Pumps: AROM;Both;10 reps;Seated Quad Sets: AROM;Left;Seated;5 reps Heel Slides: Left;AROM;5 reps;Supine Long Arc Quad: AROM;Strengthening;Left;Seated;5 reps Knee Flexion: AROM;Left;Seated;5 reps    General Comments General comments (skin integrity, edema, etc.): Knee ext/flex 12-78 degrees      Pertinent Vitals/Pain Pain Assessment: 0-10 Pain Score:  ("It's the worst it has been yet." no value given) Pain Location: Lt anterior knee Pain Descriptors / Indicators: Aching Pain Intervention(s): Monitored during session;Repositioned    Home Living                      Prior Function            PT Goals (current goals can now be found in the care  plan section) Acute Rehab PT Goals PT Goal Formulation: With patient Time For Goal Achievement: 09/18/14 Potential to Achieve Goals: Good Progress towards PT goals: Progressing toward goals    Frequency  7X/week    PT Plan Current plan remains appropriate    Co-evaluation             End of Session Equipment Utilized During Treatment: Gait belt Activity Tolerance: Patient tolerated treatment well Patient left: in chair;with call bell/phone  within reach     Time: 1401-1437 PT Time Calculation (min) (ACUTE ONLY): 36 min  Charges:  $Gait Training: 8-22 mins $Therapeutic Exercise: 8-22 mins                    G Codes:      Ellouise Newer 09/21/2014, 3:14 PM Camille Bal [place], [place]

## 2014-09-15 NOTE — Plan of Care (Signed)
Problem: Consults Goal: Diagnosis- Total Joint Replacement Primary Total Knee Left     

## 2014-10-07 ENCOUNTER — Ambulatory Visit (INDEPENDENT_AMBULATORY_CARE_PROVIDER_SITE_OTHER): Payer: Medicare Other

## 2014-10-07 ENCOUNTER — Ambulatory Visit: Payer: Medicare Other

## 2014-10-07 VITALS — BP 144/86 | HR 82 | Resp 14 | Ht 63.0 in | Wt 245.0 lb

## 2014-10-07 DIAGNOSIS — S99921A Unspecified injury of right foot, initial encounter: Secondary | ICD-10-CM | POA: Diagnosis not present

## 2014-10-07 NOTE — Progress Notes (Signed)
   Subjective:    Patient ID: Kelsey Wilson, female    DOB: [birth year], 54 y.o.   MRN: [id number]  HPI    Review of Systems  All other systems reviewed and are negative.      Objective:   Physical Exam        Assessment & Plan:

## 2014-12-08 ENCOUNTER — Other Ambulatory Visit: Payer: Self-pay | Admitting: Registered Nurse

## 2014-12-08 NOTE — Telephone Encounter (Signed)
Refilled tramadol.  Kelsey Wilson has appt in September 02/04/15 with Dr Letta Pate

## 2014-12-15 ENCOUNTER — Other Ambulatory Visit (HOSPITAL_BASED_OUTPATIENT_CLINIC_OR_DEPARTMENT_OTHER): Payer: Medicare Other

## 2014-12-15 ENCOUNTER — Encounter: Payer: Self-pay | Admitting: Nurse Practitioner

## 2014-12-15 ENCOUNTER — Telehealth: Payer: Self-pay | Admitting: Oncology

## 2014-12-15 ENCOUNTER — Ambulatory Visit (HOSPITAL_BASED_OUTPATIENT_CLINIC_OR_DEPARTMENT_OTHER): Payer: Medicare Other | Admitting: Nurse Practitioner

## 2014-12-15 VITALS — BP 116/61 | HR 76 | Temp 98.2°F | Resp 18 | Ht 63.0 in | Wt 241.1 lb

## 2014-12-15 DIAGNOSIS — F419 Anxiety disorder, unspecified: Secondary | ICD-10-CM | POA: Diagnosis not present

## 2014-12-15 DIAGNOSIS — C50412 Malignant neoplasm of upper-outer quadrant of left female breast: Secondary | ICD-10-CM

## 2014-12-15 DIAGNOSIS — IMO0002 Reserved for concepts with insufficient information to code with codable children: Secondary | ICD-10-CM

## 2014-12-15 DIAGNOSIS — M25561 Pain in right knee: Secondary | ICD-10-CM

## 2014-12-15 DIAGNOSIS — I890 Lymphedema, not elsewhere classified: Secondary | ICD-10-CM

## 2014-12-15 DIAGNOSIS — M797 Fibromyalgia: Secondary | ICD-10-CM

## 2014-12-15 DIAGNOSIS — M47817 Spondylosis without myelopathy or radiculopathy, lumbosacral region: Secondary | ICD-10-CM

## 2014-12-15 LAB — COMPREHENSIVE METABOLIC PANEL (CC13)
ALT: 9 U/L (ref 0–55)
AST: 16 U/L (ref 5–34)
Albumin: 3.5 g/dL (ref 3.5–5.0)
Alkaline Phosphatase: 70 U/L (ref 40–150)
Anion Gap: 7 mEq/L (ref 3–11)
BUN: 22.5 mg/dL (ref 7.0–26.0)
CO2: 32 mEq/L — ABNORMAL HIGH (ref 22–29)
Calcium: 9.5 mg/dL (ref 8.4–10.4)
Chloride: 102 mEq/L (ref 98–109)
Creatinine: 0.9 mg/dL (ref 0.6–1.1)
EGFR: 82 mL/min/{1.73_m2} — ABNORMAL LOW (ref >90)
Glucose: 88 mg/dl (ref 70–140)
Potassium: 3.3 mEq/L — ABNORMAL LOW (ref 3.5–5.1)
Sodium: 140 mEq/L (ref 136–145)
Total Bilirubin: 0.46 mg/dL (ref 0.20–1.20)
Total Protein: 6.5 g/dL (ref 6.4–8.3)

## 2014-12-15 LAB — CBC WITH DIFFERENTIAL/PLATELET
BASO%: 0.3 % (ref 0.0–2.0)
Basophils Absolute: 0 10*3/uL (ref 0.0–0.1)
EOS%: 4 % (ref 0.0–7.0)
Eosinophils Absolute: 0.2 10*3/uL (ref 0.0–0.5)
HCT: 35.3 % (ref 34.8–46.6)
HGB: 11.6 g/dL (ref 11.6–15.9)
LYMPH%: 39.1 % (ref 14.0–49.7)
MCH: 27.7 pg (ref 25.1–34.0)
MCHC: 32.8 g/dL (ref 31.5–36.0)
MCV: 84.6 fL (ref 79.5–101.0)
MONO#: 0.4 10*3/uL (ref 0.1–0.9)
MONO%: 10.1 % (ref 0.0–14.0)
NEUT#: 1.9 10*3/uL (ref 1.5–6.5)
NEUT%: 46.5 % (ref 38.4–76.8)
Platelets: 171 10*3/uL (ref 145–400)
RBC: 4.18 10*6/uL (ref 3.70–5.45)
RDW: 13.8 % (ref 11.2–14.5)
WBC: 4 10*3/uL (ref 3.9–10.3)
lymph#: 1.6 10*3/uL (ref 0.9–3.3)

## 2014-12-15 NOTE — Progress Notes (Signed)
[place]  [phone] Fax:([phone]) [phone]  OFFICE PROGRESS NOTE   ID: KAELYNN IGO   DOB: 1961-01-24  MR#: [id number]  XBM#:[id number]  PCP: Vonna Drafts., FNP GYN:  SU: Excell Seltzer, MD OTHER MD: Earnie Larsson,  Theodis Sato, MD, Alysia Penna mD  CHIEF COMPLAINT: left breast cancer CURRENT THERAPY: anastrozole 9m daily  BREAST CANCER HISTORY: From Dr. RJulien Girtnote 06/13/2011:   "The patient first noticed a lump in the upper outer left breast about mid November. She had been having regular mammograms. She consulted her physician and was referred to the breast center for further workup. Bilateral mammogram was performed which revealed an irregular mass in the upper outer quadrant of the left breast at the 2:00 position measuring approximately 3 cm. A clearly enlarged left axillary lymph node was also seen. 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 Ki-67 was 100%. Subsequent bilateral breast MRI was performed. This reveals abnormal enhancement in the area of the mass measuring 2.9 x 5.2 cm. Again noted was an approximately 3 cm axillary lymph node. No other areas were detected."   Her subsequent history is as detailed below.  INTERVAL HISTORY: MDevaneyreturns today for follow up of her breast cancer. She has been on anastrozole since November 2013 and tolerates this relatively well. She has mild hot flashes and vaginal dryness, but she can manage these on her own. Her joint pain is generalized, but is no worse. The interval history is remarkable for a left knee replacement in April, and a right knee replacement is scheduled for October. She has completed physical therapy for the left knee and is moving well, but she continues on tramadol for pain to the right knee. She recently had a cortisone injection  to this area.   REVIEW OF SYSTEMS: MTyneishadenies fevers, chills, nausea, or vomiting. She is on colace for her stools. Her appetite was down after surgery but it is improving. She takes lyrica for residual neuropathy to her fingers and feet. She has shortness of breath with exertion, but denies chest pain, cough, or palpitations. She has headaches, but denies vision changes or dizziness. She has swelling to her ankles at time and chronic left arm lymphedema. She is not wearing her sleeve today. She is on xanax PRN for anxiety. A detailed review of systems is otherwise stable.  PAST MEDICAL HISTORY: Past Medical History  Diagnosis Date  . Night sweats   . Fatigue   . Wears glasses   . Arthritis   . Hot flashes   . Blood transfusion   . Diverticulitis of colon   . Headache(784.0)   . Anemia   . Bursitis   . Carpal tunnel syndrome   . Fibromyalgia 08/2012  . Breast cancer     T3N1 invasive ductal carcinoma left breast  . SOB (shortness of breath) on exertion     walking and stairs  . Pneumonia     hx of  . Anxiety   . Depression   . GERD (gastroesophageal reflux disease)     PAST SURGICAL HISTORY: Past Surgical History  Procedure Laterality Date  . Cholecystectomy    . Appendectomy    . Carpal tunnel release      Bilateral  . Tonsillectomy    . Spur      Apex spur on both big toes  . Knee surgery  Left Knee  . Portacath placement  06/18/2011    Procedure: INSERTION PORT-A-CATH;  Surgeon: Edward Jolly, MD;  Location: [facility];  Service: General;  Laterality: Right;  right subclavian  . Abdominal hysterectomy      still has ovaries  . Axillary lymph node dissection  11/28/2011    Procedure: AXILLARY LYMPH NODE DISSECTION;  Surgeon: Edward Jolly, MD;  Location: [facility];  Service: General;  Laterality: Left;  . Toe surgery Bilateral   . Breast surgery Left 2013  . Eye surgery Left     cataract removal  . Total knee arthroplasty Left  09/13/2014  . Total knee arthroplasty Left 09/13/2014    Procedure: LEFT TOTAL KNEE ARTHROPLASTY;  Surgeon: Rod Can, MD;  Location: [facility];  Service: Orthopedics;  Laterality: Left;    FAMILY HISTORY Family History  Problem Relation Age of Onset  . Cancer Paternal Grandmother     unknown  . Hypertension Mother   . Diabetes Mother   . Hypertension Father   . Diabetes Father   Her father, 53 years old is the minister at [employer] she attends. Her mother is 64. The patient has 3 brothers and 3 sisters. There is no history of breast or ovarian cancer in the immediate family.  GYNECOLOGIC HISTORY: Menarche age 77, first live birth age 54, she is [place] P3. She underwent hysterectomy without salpingo-oophorectomy in 2000. She never took hormone replacement.  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 She is divorced. At home she lives with herr daughter Ree Edman and her 4 . Her son Alianys Chacko works as a Administrator. The patient has 6 additional grandchildren. She attends a [employer].    ADVANCED DIRECTIVES: Not in place  HEALTH MAINTENANCE: History  Substance Use Topics  . Smoking status: Never Smoker   . Smokeless tobacco: Never Used  . Alcohol Use: No     Colonoscopy: Not on file  PAP: Scheduled for January 2016  Bone density: Bone density December 2015 was normal.  Lipid panel: Not on file  Allergies  Allergen Reactions  . Compazine Other (See Comments)    Numbness of face and lips  . Aleve [Naproxen] Nausea Only  . Oxycodone     hallucinations  . Penicillins Nausea Only    Current Outpatient Prescriptions  Medication Sig Dispense Refill  . ALPRAZolam (XANAX) 0.5 MG tablet Take 0.5 mg by mouth 2 (two) times daily.     Marland Kitchen anastrozole (ARIMIDEX) 1 MG tablet TAKE 1 TABLET BY MOUTH ONCE DAILY 30 tablet 5  . docusate sodium (COLACE) 100 MG capsule Take 100 mg by mouth daily.    Marland Kitchen LYRICA 100 MG capsule TAKE 1  CAPSULE BY MOUTH THREE TIMES A DAY 90 capsule 4  . meloxicam (MOBIC) 15 MG tablet Take 15 mg by mouth daily.    Marland Kitchen omeprazole (PRILOSEC) 20 MG capsule Take 20 mg by mouth daily.   0  . traMADol (ULTRAM) 50 MG tablet Take 1 tablet (50 mg total) by mouth every 6 (six) hours as needed. 120 tablet 2  . triamterene-hydrochlorothiazide (DYAZIDE) 37.5-25 MG per capsule Take 1 capsule by mouth daily.    Marland Kitchen UNABLE TO FIND Rx: K7425- Non-Silicone Breast Prosthesis (Quantity: 1) Dx: 174.9; Left Breast Lumpectomy 1 each 0  . acetaminophen (TYLENOL) 500 MG tablet Take 500 mg by mouth as needed for pain.    . Cholecalciferol (VITAMIN D) 2000 UNITS CAPS Take  1 capsule by mouth daily.     No current facility-administered medications for this visit.   Facility-Administered Medications Ordered in Other Visits  Medication Dose Route Frequency Provider Last Rate Last Dose  . dexamethasone (DECADRON) injection 4 mg  4 mg Intravenous Once Rod Can, MD      . ondansetron (ZOFRAN) 4 mg in sodium chloride 0.9 % 50 mL IVPB  4 mg Intravenous Once Rod Can, MD        OBJECTIVE: Middle-aged [place] American woman who appears older than stated age 54 Vitals:   12/15/14 1047  BP: 116/61  Pulse: 76  Temp: 98.2 F (36.8 C)  Resp: 18     Body mass index is 42.72 kg/(m^2).    ECOG FS: 2  Skin: warm, dry  HEENT: sclerae anicteric, conjunctivae pink, oropharynx clear. No thrush or mucositis.  Lymph Nodes: No cervical or supraclavicular lymphadenopathy  Lungs: clear to auscultation bilaterally, no rales, wheezes, or rhonci  Heart: regular rate and rhythm  Abdomen: round, soft, non tender, positive bowel sounds  Musculoskeletal: No focal spinal tenderness, grade 1 left upper extremity lymphedema, gait disturbance from right knee pain Neuro: non focal, well oriented, positive affect  Breasts: left breast status post lumpectomy and radiation. No evidence of recurrent disease. Left axilla benign. Right breast  unremarkable.   LAB RESULTS: Lab Results  Component Value Date   WBC 4.0 12/15/2014   NEUTROABS 1.9 12/15/2014   HGB 11.6 12/15/2014   HCT 35.3 12/15/2014   MCV 84.6 12/15/2014   PLT 171 12/15/2014      Chemistry      Component Value Date/Time   NA 140 12/15/2014 1028   NA 138 09/14/2014 0622   K 3.3* 12/15/2014 1028   K 3.5 09/14/2014 0622   CL 99 09/14/2014 0622   CL 101 09/18/2012 0853   CO2 32* 12/15/2014 1028   CO2 28 09/14/2014 0622   BUN 22.5 12/15/2014 1028   BUN 14 09/14/2014 0622   CREATININE 0.9 12/15/2014 1028   CREATININE 0.82 09/14/2014 0622      Component Value Date/Time   CALCIUM 9.5 12/15/2014 1028   CALCIUM 9.0 09/14/2014 0622   ALKPHOS 70 12/15/2014 1028   ALKPHOS 60 09/02/2014 1044   AST 16 12/15/2014 1028   AST 20 09/02/2014 1044   ALT 9 12/15/2014 1028   ALT 15 09/02/2014 1044   BILITOT 0.46 12/15/2014 1028   BILITOT 0.7 09/02/2014 1044      Lab Results  Component Value Date   LABCA2 41* 06/13/2011    Urinalysis    Component Value Date/Time   COLORURINE YELLOW 09/02/2014 1044   APPEARANCEUR CLEAR 09/02/2014 1044   LABSPEC 1.012 09/02/2014 1044   PHURINE 6.0 09/02/2014 1044   GLUCOSEU NEGATIVE 09/02/2014 1044   HGBUR NEGATIVE 09/02/2014 1044   BILIRUBINUR NEGATIVE 09/02/2014 1044   KETONESUR NEGATIVE 09/02/2014 1044   PROTEINUR NEGATIVE 09/02/2014 1044   UROBILINOGEN 0.2 09/02/2014 1044   NITRITE NEGATIVE 09/02/2014 1044   LEUKOCYTESUR NEGATIVE 09/02/2014 1044    STUDIES: No results found.    ASSESSMENT: 54 y.o. [place], [place] woman:  (1) Status post left breast biopsy 06/01/2011 for a cT2 pN1, stage IIB invasive ductal carcinoma, grade 3, estrogen receptor 80% and progesterone receptor 9% positive, with no HER-2 amplification and an MIB-1 of 100%  (2) Treated neoadjuvantly with 4 cycles of cyclophosphamide, epirubicin and fluorouracil, followed by 4 cycles of docetaxel, completed 11/01/2011  (3) Status post  left lumpectomy and axillary  lymph node resection 11/28/2011, showing a complete pathologic response (no residual invasive or in situ cancer in the breast and 0 of 17 lymph nodes involved)  (4) Adjuvant radiation therapy completed 03/31/2012  (5) Started anastrozole November 2013; normal bone density scan 05/26/2014  (6) chronic Left upper extremity lymphedema  (7) fibromyalgia and osteoarthritis with polyarthralgia  PLAN:  Novah continues to do well as far as her breast cancer is concerned. She is now 3.5 years out from her definitive surgery with no evidence of recurrent disease. She is tolerating the anastrozole well and will continue this drug until November 2018 to complete 5 years of antiestrogen therapy.   Wandra will have repeat mammogram in December, then have labs and a follow up visit with Dr. Jana Hakim in January. At this time he may very well decide to move her to yearly visits. She understands and agrees with this plan. She knows the goal of treatment in her case is cure. She has been encouraged to call with any issues that might arise before her next visit here.  Laurie Panda, NP   12/15/2014   11:25 AM

## 2014-12-15 NOTE — Telephone Encounter (Signed)
Gave avs & calendar for January °

## 2014-12-23 ENCOUNTER — Encounter: Payer: Self-pay | Admitting: Podiatry

## 2014-12-23 ENCOUNTER — Ambulatory Visit (INDEPENDENT_AMBULATORY_CARE_PROVIDER_SITE_OTHER): Payer: Medicare Other | Admitting: Podiatry

## 2014-12-23 DIAGNOSIS — B351 Tinea unguium: Secondary | ICD-10-CM

## 2014-12-23 DIAGNOSIS — M79676 Pain in unspecified toe(s): Secondary | ICD-10-CM

## 2014-12-23 DIAGNOSIS — M722 Plantar fascial fibromatosis: Secondary | ICD-10-CM

## 2014-12-23 NOTE — Progress Notes (Signed)
Subjective:     Patient ID: Kelsey Wilson, female   DOB: 10/22/1960, 54 y.o.   MRN: [id number]  HPIThis patient returns for nail care both feet.  She admits to having right knee treatment prior to visit.  She states she has pain through bottom of both feet which were treated plantar fascial brace which helped some.  Peripheral neuropathy.   Review of Systems     Objective:   Physical Exam GENERAL APPEARANCE: Alert, conversant. Appropriately groomed. No acute distress.  VASCULAR: Pedal pulses palpable at 2/4 DP and PT bilateral.  Capillary refill time is immediate to all digits,  Proximal to distal cooling it warm to warm.  Digital hair growth is present bilateral  NEUROLOGIC: sensation is intact epicritically and protectively to 5.07 monofilament at 5/5 sites bilateral.  Light touch is intact bilateral, vibratory sensation intact bilateral, achilles tendon reflex is intact bilateral.  MUSCULOSKELETAL: acceptable muscle strength, tone and stability bilateral.  Intrinsic muscluature intact bilateral.  Rectus appearance of foot and digits noted bilateral.   Plantar fascia painful  upon palpation B/L  DERMATOLOGIC: skin color, texture, and turgor are within normal limits.  No preulcerative lesions or ulcers  are seen, no interdigital maceration noted.  No open lesions present.  . No drainage noted. NAILS  Thick disfigured discolored nails both feet x 10      Assessment:     Onychomycosis    Plan:  Debridement of onychomycotic nails.

## 2015-01-12 ENCOUNTER — Other Ambulatory Visit: Payer: Self-pay | Admitting: Physical Medicine & Rehabilitation

## 2015-02-02 ENCOUNTER — Ambulatory Visit: Payer: Self-pay | Admitting: Orthopedic Surgery

## 2015-02-04 ENCOUNTER — Encounter: Payer: Medicare Other | Attending: Physical Medicine & Rehabilitation

## 2015-02-04 ENCOUNTER — Ambulatory Visit (HOSPITAL_BASED_OUTPATIENT_CLINIC_OR_DEPARTMENT_OTHER): Payer: Medicare Other | Admitting: Physical Medicine & Rehabilitation

## 2015-02-04 ENCOUNTER — Encounter: Payer: Self-pay | Admitting: Physical Medicine & Rehabilitation

## 2015-02-04 VITALS — BP 104/68 | HR 68

## 2015-02-04 DIAGNOSIS — G620 Drug-induced polyneuropathy: Secondary | ICD-10-CM | POA: Diagnosis present

## 2015-02-04 DIAGNOSIS — Z8249 Family history of ischemic heart disease and other diseases of the circulatory system: Secondary | ICD-10-CM | POA: Insufficient documentation

## 2015-02-04 DIAGNOSIS — M179 Osteoarthritis of knee, unspecified: Secondary | ICD-10-CM | POA: Diagnosis not present

## 2015-02-04 DIAGNOSIS — T451X5D Adverse effect of antineoplastic and immunosuppressive drugs, subsequent encounter: Secondary | ICD-10-CM | POA: Diagnosis not present

## 2015-02-04 DIAGNOSIS — Z833 Family history of diabetes mellitus: Secondary | ICD-10-CM | POA: Insufficient documentation

## 2015-02-04 DIAGNOSIS — M1712 Unilateral primary osteoarthritis, left knee: Secondary | ICD-10-CM

## 2015-02-04 DIAGNOSIS — G622 Polyneuropathy due to other toxic agents: Secondary | ICD-10-CM

## 2015-02-04 DIAGNOSIS — M1711 Unilateral primary osteoarthritis, right knee: Secondary | ICD-10-CM

## 2015-02-04 DIAGNOSIS — M797 Fibromyalgia: Secondary | ICD-10-CM | POA: Diagnosis not present

## 2015-02-04 DIAGNOSIS — Z79899 Other long term (current) drug therapy: Secondary | ICD-10-CM | POA: Insufficient documentation

## 2015-02-04 DIAGNOSIS — M47817 Spondylosis without myelopathy or radiculopathy, lumbosacral region: Secondary | ICD-10-CM | POA: Diagnosis not present

## 2015-02-04 DIAGNOSIS — C50919 Malignant neoplasm of unspecified site of unspecified female breast: Secondary | ICD-10-CM | POA: Diagnosis not present

## 2015-02-04 DIAGNOSIS — IMO0002 Reserved for concepts with insufficient information to code with codable children: Secondary | ICD-10-CM

## 2015-02-04 DIAGNOSIS — Z96652 Presence of left artificial knee joint: Secondary | ICD-10-CM | POA: Insufficient documentation

## 2015-02-04 MED ORDER — PREGABALIN 100 MG PO CAPS: 100.0000 mg | ORAL_CAPSULE | Freq: Three times a day (TID) | ORAL | Status: DC

## 2015-02-04 NOTE — Progress Notes (Signed)
Subjective:    Patient ID: Kelsey Wilson, female    DOB: [birth year], 54 y.o.   MRN: [id number]  HPI 60 showed female with history of chemotherapy induced peripheral neuropathy.Her last chemotherapy visit was more than 2 years ago.  In the interval time since my last visit with her, she underwent left total knee replacement 09/09/2014. She also states that she is scheduled for a right total knee replacement in October. She's had no post operative complications.She did receive hydrocodone from her surgeon postoperatively. Currently she is on tramadol 50 mg 4 times a day Lyrica 100 mg 3 times a day Pain Inventory Average Pain 7 Pain Right Now 9 My pain is na  In the last 24 hours, has pain interfered with the following? General activity 7 Relation with others 7 Enjoyment of life 7 What TIME of day is your pain at its worst? daytime,night Sleep (in general) Fair  Pain is worse with: walking and bending Pain improves with: na Relief from Meds: na  Mobility use a walker ability to climb steps?  yes do you drive?  yes Do you have any goals in this area?  yes  Function disabled: date disabled na I need assistance with the following:  dressing, bathing, meal prep and household duties  Neuro/Psych weakness numbness tingling trouble walking depression  Prior Studies na  Physicians involved in your care na   Family History  Problem Relation Age of Onset  . Cancer Paternal Grandmother     unknown  . Hypertension Mother   . Diabetes Mother   . Hypertension Father   . Diabetes Father    Social History   Social History  . Marital Status: Single    Spouse Name: N/A  . Number of Children: N/A  . Years of Education: N/A   Social History Main Topics  . Smoking status: Never Smoker   . Smokeless tobacco: Never Used  . Alcohol Use: No  . Drug Use: No  . Sexual Activity: No   Other Topics Concern  . None   Social History Narrative   Past Surgical History    Procedure Laterality Date  . Cholecystectomy    . Appendectomy    . Carpal tunnel release      Bilateral  . Tonsillectomy    . Spur      Apex spur on both big toes  . Knee surgery      Left Knee  . Portacath placement  06/18/2011    Procedure: INSERTION PORT-A-CATH;  Surgeon: Edward Jolly, MD;  Location: [facility];  Service: General;  Laterality: Right;  right subclavian  . Abdominal hysterectomy      still has ovaries  . Axillary lymph node dissection  11/28/2011    Procedure: AXILLARY LYMPH NODE DISSECTION;  Surgeon: Edward Jolly, MD;  Location: [facility];  Service: General;  Laterality: Left;  . Toe surgery Bilateral   . Breast surgery Left 2013  . Eye surgery Left     cataract removal  . Total knee arthroplasty Left 09/13/2014  . Total knee arthroplasty Left 09/13/2014    Procedure: LEFT TOTAL KNEE ARTHROPLASTY;  Surgeon: Rod Can, MD;  Location: [facility];  Service: Orthopedics;  Laterality: Left;   Past Medical History  Diagnosis Date  . Night sweats   . Fatigue   . Wears glasses   . Arthritis   . Hot flashes   . Blood transfusion   . Diverticulitis of colon   .  Headache(784.0)   . Anemia   . Bursitis   . Carpal tunnel syndrome   . Fibromyalgia 08/2012  . Breast cancer     T3N1 invasive ductal carcinoma left breast  . SOB (shortness of breath) on exertion     walking and stairs  . Pneumonia     hx of  . Anxiety   . Depression   . GERD (gastroesophageal reflux disease)    BP 104/68 mmHg  Pulse 68  SpO2 97%  Opioid Risk Score:   Fall Risk Score:  `1  Depression screen PHQ 2/9  Depression screen PHQ 2/9 02/04/2015  Decreased Interest 0  Down, Depressed, Hopeless 0  PHQ - 2 Score 0     Review of Systems  Cardiovascular:       Limb swelling  All other systems reviewed and are negative.      Objective:   Physical Exam  Constitutional: She is oriented to person, place, and time. She appears well-developed and  well-nourished.  HENT:  Head: Normocephalic and atraumatic.  Eyes: Conjunctivae and EOM are normal. Pupils are equal, round, and reactive to light.  Neck: Normal range of motion.  Musculoskeletal:       Right knee: She exhibits normal range of motion, no effusion and no erythema. Tenderness found. Patellar tendon tenderness noted. No lateral joint line tenderness noted.       Left knee: She exhibits decreased range of motion. She exhibits no swelling. No tenderness found.       Right hand: Decreased sensation noted. Decreased sensation is present in the ulnar distribution and is present in the medial distribution. Normal strength noted.       Left hand: Decreased sensation noted. Decreased sensation is present in the ulnar distribution and is present in the medial distribution.       Right foot: There is deformity.       Left foot: There is deformity.  Well-healed left midline knee incision nontender to palpation  Right knee has tenderness along the medial joint line as well as over the patellar tendon, no evidence of pain over the lateral joint line  Intrinsic atrophy of both feet  Neurological: She is alert and oriented to person, place, and time.  Unusual sensory examination has decreased sensation  In both ankles and feet but normal in the upper legs and knees. However she has decreased sensation in the arms from shoulder to fingers bilaterally. This is pinprick sensation  Motor strength is 5/5 bilateral deltoids, biceps, triceps, grip, hip flexor, knee extensor, ankle dorsal flexor plantar flexor  Psychiatric: She has a normal mood and affect.  Nursing note and vitals reviewed.         Assessment & Plan:  1. Chronic peripheral neuropathy following chemotherapy for breast cancer. Her breast cancer is currently in remission, On chronic Arimidex Her peripheral neuropathy pain is stable. Continue on current dose of Lyrica 100 mg 3 times a day in Combination with tramadol 50 mg 4 times  a day  2. Right knee osteoarthritis scheduled for total knee replacement may get postoperative pain medications from Orthopedic surgery  Follow-up in 6 months with nurse practitioner

## 2015-02-04 NOTE — Patient Instructions (Addendum)
Recommend Stationary bike and aquatic exercise on an ongoing exercise even after rehab is completed   Capsaicin creme may help with stinging pain but need to use 4 times a day for more than one week

## 2015-02-21 ENCOUNTER — Ambulatory Visit: Payer: Self-pay | Admitting: Orthopedic Surgery

## 2015-02-21 NOTE — H&P (Signed)
TOTAL KNEE ADMISSION H&P  Patient is being admitted for right total knee arthroplasty.  Subjective:  Chief Complaint:right knee pain.  HPI: Kelsey Wilson, 54 y.o. female, has a history of pain and functional disability in the right knee due to arthritis and has failed non-surgical conservative treatments for greater than 12 weeks to includeNSAID's and/or analgesics, corticosteriod injections, flexibility and strengthening excercises, use of assistive devices, weight reduction as appropriate and activity modification.  Onset of symptoms was gradual, starting 2 years ago with gradually worsening course since that time. The patient noted no past surgery on the right knee(s).  Patient currently rates pain in the right knee(s) at 10 out of 10 with activity. Patient has night pain, worsening of pain with activity and weight bearing, pain that interferes with activities of daily living, pain with passive range of motion, crepitus and joint swelling.  Patient has evidence of subchondral cysts, subchondral sclerosis, periarticular osteophytes and joint space narrowing by imaging studies.  There is no active infection.  Patient Active Problem List   Diagnosis Date Noted  . Osteoarthritis of right knee 02/04/2015  . Primary osteoarthritis of left knee 09/13/2014  . Hot flashes 01/19/2014  . Abdominal pain, unspecified site 01/19/2014  . Breast cancer of upper-outer quadrant of left female breast 03/19/2013  . Lumbosacral spondylosis without myelopathy 12/30/2012  . Fibromyalgia syndrome 08/15/2012  . Peripheral neuropathy, toxic 08/15/2012  . Lymphedema of arm - left 04/30/2012   Past Medical History  Diagnosis Date  . Night sweats   . Fatigue   . Wears glasses   . Arthritis   . Hot flashes   . Blood transfusion   . Diverticulitis of colon   . Headache(784.0)   . Anemia   . Bursitis   . Carpal tunnel syndrome   . Fibromyalgia 08/2012  . Breast cancer     T3N1 invasive ductal carcinoma  left breast  . SOB (shortness of breath) on exertion     walking and stairs  . Pneumonia     hx of  . Anxiety   . Depression   . GERD (gastroesophageal reflux disease)     Past Surgical History  Procedure Laterality Date  . Cholecystectomy    . Appendectomy    . Carpal tunnel release      Bilateral  . Tonsillectomy    . Spur      Apex spur on both big toes  . Knee surgery      Left Knee  . Portacath placement  06/18/2011    Procedure: INSERTION PORT-A-CATH;  Surgeon: Edward Jolly, MD;  Location: [facility] [place] [facility];  Service: General;  Laterality: Right;  right subclavian  . Abdominal hysterectomy      still has ovaries  . Axillary lymph node dissection  11/28/2011    Procedure: AXILLARY LYMPH NODE DISSECTION;  Surgeon: Edward Jolly, MD;  Location: [facility];  Service: General;  Laterality: Left;  . Toe surgery Bilateral   . Breast surgery Left 2013  . Eye surgery Left     cataract removal  . Total knee arthroplasty Left 09/13/2014  . Total knee arthroplasty Left 09/13/2014    Procedure: LEFT TOTAL KNEE ARTHROPLASTY;  Surgeon: Rod Can, MD;  Location: [facility];  Service: Orthopedics;  Laterality: Left;     (Not in a hospital admission) Allergies  Allergen Reactions  . Compazine Other (See Comments)    Numbness of face and lips  . Aleve [Naproxen] Nausea Only  . Oxycodone  hallucinations  . Penicillins Nausea Only    Social History  Substance Use Topics  . Smoking status: Never Smoker   . Smokeless tobacco: Never Used  . Alcohol Use: No    Family History  Problem Relation Age of Onset  . Cancer Paternal Grandmother     unknown  . Hypertension Mother   . Diabetes Mother   . Hypertension Father   . Diabetes Father      Review of Systems  Constitutional: Negative.   HENT: Negative.   Eyes: Negative.   Cardiovascular: Negative.   Gastrointestinal: Negative.   Genitourinary: Negative.   Musculoskeletal: Positive for back pain and joint  pain.  Skin: Negative.   Neurological: Negative.   Endo/Heme/Allergies: Negative.   Psychiatric/Behavioral: Negative.     Objective:  Physical Exam  Constitutional: She is oriented to person, place, and time. She appears well-developed and well-nourished.  HENT:  Head: Normocephalic and atraumatic.  Eyes: Conjunctivae and EOM are normal. Pupils are equal, round, and reactive to light.  Neck: Normal range of motion. Neck supple.  Cardiovascular: Normal rate, regular rhythm and intact distal pulses.   Respiratory: Effort normal and breath sounds normal. No respiratory distress.  GI: Soft. Bowel sounds are normal. She exhibits no distension.  Genitourinary:  deferred  Musculoskeletal:       Right knee: She exhibits decreased range of motion, swelling and deformity. Tenderness found. Medial joint line and lateral joint line tenderness noted.  Neurological: She is alert and oriented to person, place, and time. She has normal reflexes.  Skin: Skin is warm and dry.  Psychiatric: She has a normal mood and affect. Her behavior is normal. Judgment and thought content normal.    Vital signs in last 24 hours: @VSRANGES @  Labs:   Estimated body mass index is 42.72 kg/(m^2) as calculated from the following:   Height as of 12/15/14: 5\' 3"  (1.6 m).   Weight as of 12/15/14: 109.362 kg (241 lb 1.6 oz).   Imaging Review Plain radiographs demonstrate severe degenerative joint disease of the right knee(s). The overall alignment ismild varus. The bone quality appears to be adequate for age and reported activity level.  Assessment/Plan:  End stage arthritis, right knee   The patient history, physical examination, clinical judgment of the Dejean Tribby and imaging studies are consistent with end stage degenerative joint disease of the right knee(s) and total knee arthroplasty is deemed medically necessary. The treatment options including medical management, injection therapy arthroscopy and  arthroplasty were discussed at length. The risks and benefits of total knee arthroplasty were presented and reviewed. The risks due to aseptic loosening, infection, stiffness, patella tracking problems, thromboembolic complications and other imponderables were discussed. The patient acknowledged the explanation, agreed to proceed with the plan and consent was signed. Patient is being admitted for inpatient treatment for surgery, pain control, PT, OT, prophylactic antibiotics, VTE prophylaxis, progressive ambulation and ADL's and discharge planning. The patient is planning to be discharged home with home health services

## 2015-02-24 NOTE — Patient Instructions (Addendum)
Kelsey Wilson  02/24/2015   Your procedure is scheduled on: March 10, 2015  Report to [facility] Main  Entrance take [place]  elevators to 3rd floor to  [place] at  10:45 AM.  Call this number if you have problems the morning of surgery [phone]   Remember: ONLY 1 PERSON MAY GO WITH YOU TO SHORT STAY TO GET  READY MORNING OF [place].  Do not eat food or drink liquids :After Midnight.     Take these medicines the morning of surgery with A SIP OF WATER: Xanax if needed, Anastrozole (arimidex), Prilosec, Lyrica                                You may not have any metal on your body including hair pins and              piercings  Do not wear jewelry, make-up, lotions, powders or perfumes, deodorant             Do not wear nail polish.  Do not shave  48 hours prior to surgery.             Do not bring valuables to the hospital. [place].  Contacts, dentures or bridgework may not be worn into surgery.  Leave suitcase in the car. After surgery it may be brought to your room.       Special Instructions: coughing and deep breathing exercises, leg exercises              Please read over the following fact sheets you were given: _____________________________________________________________________             [facility] - Preparing for Surgery Before surgery, you can play an important role.  Because skin is not sterile, your skin needs to be as free of germs as possible.  You can reduce the number of germs on your skin by washing with CHG (chlorahexidine gluconate) soap before surgery.  CHG is an antiseptic cleaner which kills germs and bonds with the skin to continue killing germs even after washing. Please DO NOT use if you have an allergy to CHG or antibacterial soaps.  If your skin becomes reddened/irritated stop using the CHG and inform your nurse when you arrive at Short Stay. Do not shave  (including legs and underarms) for at least 48 hours prior to the first CHG shower.  You may shave your face/neck. Please follow these instructions carefully:  1.  Shower with CHG Soap the night before surgery and the  morning of Surgery.  2.  If you choose to wash your hair, wash your hair first as usual with your  normal  shampoo.  3.  After you shampoo, rinse your hair and body thoroughly to remove the  shampoo.                           4.  Use CHG as you would any other liquid soap.  You can apply chg directly  to the skin and wash                       Gently with a  scrungie or clean washcloth.  5.  Apply the CHG Soap to your body ONLY FROM THE NECK DOWN.   Do not use on face/ open                           Wound or open sores. Avoid contact with eyes, ears mouth and genitals (private parts).                       Wash face,  Genitals (private parts) with your normal soap.             6.  Wash thoroughly, paying special attention to the area where your surgery  will be performed.  7.  Thoroughly rinse your body with warm water from the neck down.  8.  DO NOT shower/wash with your normal soap after using and rinsing off  the CHG Soap.                9.  Pat yourself dry with a clean towel.            10.  Wear clean pajamas.            11.  Place clean sheets on your bed the night of your first shower and do not  sleep with pets. Day of Surgery : Do not apply any lotions/deodorants the morning of surgery.  Please wear clean clothes to the hospital/surgery center.  FAILURE TO FOLLOW THESE INSTRUCTIONS MAY RESULT IN THE CANCELLATION OF YOUR SURGERY PATIENT SIGNATURE_________________________________  NURSE SIGNATURE__________________________________  ________________________________________________________________________   Adam Phenix  An incentive spirometer is a tool that can help keep your lungs clear and active. This tool measures how well you are filling your lungs with  each breath. Taking long deep breaths may help reverse or decrease the chance of developing breathing (pulmonary) problems (especially infection) following:  A long period of time when you are unable to move or be active. BEFORE THE PROCEDURE   If the spirometer includes an indicator to show your best effort, your nurse or respiratory therapist will set it to a desired goal.  If possible, sit up straight or lean slightly forward. Try not to slouch.  Hold the incentive spirometer in an upright position. INSTRUCTIONS FOR USE   Sit on the edge of your bed if possible, or sit up as far as you can in bed or on a chair.  Hold the incentive spirometer in an upright position.  Breathe out normally.  Place the mouthpiece in your mouth and seal your lips tightly around it.  Breathe in slowly and as deeply as possible, raising the piston or the ball toward the top of the column.  Hold your breath for 3-5 seconds or for as long as possible. Allow the piston or ball to fall to the bottom of the column.  Remove the mouthpiece from your mouth and breathe out normally.  Rest for a few seconds and repeat Steps 1 through 7 at least 10 times every 1-2 hours when you are awake. Take your time and take a few normal breaths between deep breaths.  The spirometer may include an indicator to show your best effort. Use the indicator as a goal to work toward during each repetition.  After each set of 10 deep breaths, practice coughing to be sure your lungs are clear. If you have an incision (the cut made at the time of surgery), support your incision  when coughing by placing a pillow or rolled up towels firmly against it. Once you are able to get out of bed, walk around indoors and cough well. You may stop using the incentive spirometer when instructed by your caregiver.  RISKS AND COMPLICATIONS  Take your time so you do not get dizzy or light-headed.  If you are in pain, you may need to take or ask for  pain medication before doing incentive spirometry. It is harder to take a deep breath if you are having pain. AFTER USE  Rest and breathe slowly and easily.  It can be helpful to keep track of a log of your progress. Your caregiver can provide you with a simple table to help with this. If you are using the spirometer at home, follow these instructions: [place] IF:   You are having difficultly using the spirometer.  You have trouble using the spirometer as often as instructed.  Your pain medication is not giving enough relief while using the spirometer.  You develop fever of 100.5 F (38.1 C) or higher. SEEK IMMEDIATE MEDICAL CARE IF:   You cough up bloody sputum that had not been present before.  You develop fever of 102 F (38.9 C) or greater.  You develop worsening pain at or near the incision site. MAKE SURE YOU:   Understand these instructions.  Will watch your condition.  Will get help right away if you are not doing well or get worse. Document Released: 10/01/2006 Document Revised: 08/13/2011 Document Reviewed: 12/02/2006 Exit[employer] Patient Information 2014 [employer], [place].   ________________________________________________________________________  WHAT IS A BLOOD TRANSFUSION? Blood Transfusion Information  A transfusion is the replacement of blood or some of its parts. Blood is made up of multiple cells which provide different functions.  Red blood cells carry oxygen and are used for blood loss replacement.  White blood cells fight against infection.  Platelets control bleeding.  Plasma helps clot blood.  Other blood products are available for specialized needs, such as hemophilia or other clotting disorders. BEFORE THE TRANSFUSION  Who gives blood for transfusions?   Healthy volunteers who are fully evaluated to make sure their blood is safe. This is blood bank blood. Transfusion therapy is the safest it has ever been in the practice of medicine.  Before blood is taken from a donor, a complete history is taken to make sure that person has no history of diseases nor engages in risky social behavior (examples are intravenous drug use or sexual activity with multiple partners). The donor's travel history is screened to minimize risk of transmitting infections, such as malaria. The donated blood is tested for signs of infectious diseases, such as HIV and hepatitis. The blood is then tested to be sure it is compatible with you in order to minimize the chance of a transfusion reaction. If you or a relative donates blood, this is often done in anticipation of surgery and is not appropriate for emergency situations. It takes many days to process the donated blood. RISKS AND COMPLICATIONS Although transfusion therapy is very safe and saves many lives, the main dangers of transfusion include:   Getting an infectious disease.  Developing a transfusion reaction. This is an allergic reaction to something in the blood you were given. Every precaution is taken to prevent this. The decision to have a blood transfusion has been considered carefully by your caregiver before blood is given. Blood is not given unless the benefits outweigh the risks. AFTER THE TRANSFUSION  Right after  receiving a blood transfusion, you will usually feel much better and more energetic. This is especially true if your red blood cells have gotten low (anemic). The transfusion raises the level of the red blood cells which carry oxygen, and this usually causes an energy increase.  The nurse administering the transfusion will monitor you carefully for complications. HOME CARE INSTRUCTIONS  No special instructions are needed after a transfusion. You may find your energy is better. Speak with your caregiver about any limitations on activity for underlying diseases you may have. SEEK MEDICAL CARE IF:   Your condition is not improving after your transfusion.  You develop redness or  irritation at the intravenous (IV) site. SEEK IMMEDIATE MEDICAL CARE IF:  Any of the following symptoms occur over the next 12 hours:  Shaking chills.  You have a temperature by mouth above 102 F (38.9 C), not controlled by medicine.  Chest, back, or muscle pain.  People around you feel you are not acting correctly or are confused.  Shortness of breath or difficulty breathing.  Dizziness and fainting.  You get a rash or develop hives.  You have a decrease in urine output.  Your urine turns a dark color or changes to pink, red, or brown. Any of the following symptoms occur over the next 10 days:  You have a temperature by mouth above 102 F (38.9 C), not controlled by medicine.  Shortness of breath.  Weakness after normal activity.  The white part of the eye turns yellow (jaundice).  You have a decrease in the amount of urine or are urinating less often.  Your urine turns a dark color or changes to pink, red, or brown. Document Released: 05/18/2000 Document Revised: 08/13/2011 Document Reviewed: 01/05/2008 [facility] Patient Information 2014 [place], [place].  _______________________________________________________________________

## 2015-02-28 ENCOUNTER — Encounter (HOSPITAL_COMMUNITY): Payer: Self-pay

## 2015-02-28 ENCOUNTER — Encounter (HOSPITAL_COMMUNITY)
Admission: RE | Admit: 2015-02-28 | Discharge: 2015-02-28 | Disposition: A | Discharge status: Home / Self Care | Payer: Medicare Other | Source: Ambulatory Visit | Attending: Orthopedic Surgery | Admitting: Orthopedic Surgery

## 2015-02-28 DIAGNOSIS — Z01818 Encounter for other preprocedural examination: Secondary | ICD-10-CM | POA: Diagnosis not present

## 2015-02-28 DIAGNOSIS — M179 Osteoarthritis of knee, unspecified: Secondary | ICD-10-CM | POA: Insufficient documentation

## 2015-02-28 LAB — COMPREHENSIVE METABOLIC PANEL
ALT: 12 U/L — ABNORMAL LOW (ref 14–54)
AST: 24 U/L (ref 15–41)
Albumin: 3.9 g/dL (ref 3.5–5.0)
Alkaline Phosphatase: 56 U/L (ref 38–126)
Anion gap: 4 — ABNORMAL LOW (ref 5–15)
BUN: 19 mg/dL (ref 6–20)
CO2: 34 mmol/L — ABNORMAL HIGH (ref 22–32)
Calcium: 9.4 mg/dL (ref 8.9–10.3)
Chloride: 100 mmol/L — ABNORMAL LOW (ref 101–111)
Creatinine, Ser: 0.74 mg/dL (ref 0.44–1.00)
GFR calc Af Amer: >60 mL/min (ref >60)
GFR calc non Af Amer: >60 mL/min (ref >60)
Glucose, Bld: 85 mg/dL (ref 65–99)
Potassium: 3.6 mmol/L (ref 3.5–5.1)
Sodium: 138 mmol/L (ref 135–145)
Total Bilirubin: 0.7 mg/dL (ref 0.3–1.2)
Total Protein: 7.1 g/dL (ref 6.5–8.1)

## 2015-02-28 LAB — CBC
HCT: 37.2 % (ref 36.0–46.0)
Hemoglobin: 12.2 g/dL (ref 12.0–15.0)
MCH: 27.6 pg (ref 26.0–34.0)
MCHC: 32.8 g/dL (ref 30.0–36.0)
MCV: 84.2 fL (ref 78.0–100.0)
Platelets: 174 10*3/uL (ref 150–400)
RBC: 4.42 MIL/uL (ref 3.87–5.11)
RDW: 13.8 % (ref 11.5–15.5)
WBC: 3.1 10*3/uL — ABNORMAL LOW (ref 4.0–10.5)

## 2015-02-28 LAB — SURGICAL PCR SCREEN
MRSA, PCR: NEGATIVE
Staphylococcus aureus: NEGATIVE

## 2015-02-28 LAB — PROTIME-INR
INR: 0.94 (ref 0.00–1.49)
Prothrombin Time: 12.8 seconds (ref 11.6–15.2)

## 2015-02-28 LAB — APTT: aPTT: 27 seconds (ref 24–37)

## 2015-02-28 LAB — ABO/RH: ABO/RH(D): O POS

## 2015-02-28 NOTE — Progress Notes (Addendum)
02-04-15 - LOV- Dr. Letta Pate (Phys.Med.) - EPIC 12-15-14 - LOV - H. Boelter, NP (oncology) - EPIC 09-02-14 - EKG - EPIC 08-04-14 - LOV- Hadassah Pais, NP   (Fam.Med.) - medical clearance - in chart

## 2015-03-01 ENCOUNTER — Other Ambulatory Visit: Payer: Self-pay | Admitting: Oncology

## 2015-03-10 ENCOUNTER — Inpatient Hospital Stay (HOSPITAL_COMMUNITY)
Admission: RE | Admit: 2015-03-10 | Discharge: 2015-03-12 | DRG: 470 | Disposition: A | Discharge status: Home Health | Payer: Medicare Other | Source: Ambulatory Visit | Attending: Orthopedic Surgery | Admitting: Orthopedic Surgery

## 2015-03-10 ENCOUNTER — Inpatient Hospital Stay (HOSPITAL_COMMUNITY): Payer: Medicare Other | Admitting: Anesthesiology

## 2015-03-10 ENCOUNTER — Encounter (HOSPITAL_COMMUNITY)
Admission: RE | Disposition: A | Discharge status: Home Health | Payer: Self-pay | Source: Ambulatory Visit | Attending: Orthopedic Surgery

## 2015-03-10 ENCOUNTER — Inpatient Hospital Stay (HOSPITAL_COMMUNITY): Payer: Medicare Other

## 2015-03-10 ENCOUNTER — Encounter (HOSPITAL_COMMUNITY): Payer: Self-pay | Admitting: *Deleted

## 2015-03-10 DIAGNOSIS — M1711 Unilateral primary osteoarthritis, right knee: Principal | ICD-10-CM | POA: Diagnosis present

## 2015-03-10 DIAGNOSIS — Z96652 Presence of left artificial knee joint: Secondary | ICD-10-CM | POA: Diagnosis present

## 2015-03-10 DIAGNOSIS — Z853 Personal history of malignant neoplasm of breast: Secondary | ICD-10-CM

## 2015-03-10 DIAGNOSIS — K219 Gastro-esophageal reflux disease without esophagitis: Secondary | ICD-10-CM | POA: Diagnosis present

## 2015-03-10 DIAGNOSIS — Z01812 Encounter for preprocedural laboratory examination: Secondary | ICD-10-CM

## 2015-03-10 DIAGNOSIS — M797 Fibromyalgia: Secondary | ICD-10-CM | POA: Diagnosis present

## 2015-03-10 DIAGNOSIS — Z09 Encounter for follow-up examination after completed treatment for conditions other than malignant neoplasm: Secondary | ICD-10-CM

## 2015-03-10 DIAGNOSIS — M25561 Pain in right knee: Secondary | ICD-10-CM | POA: Diagnosis present

## 2015-03-10 HISTORY — PX: TOTAL KNEE ARTHROPLASTY: SHX125

## 2015-03-10 LAB — GLUCOSE, CAPILLARY: Glucose-Capillary: 103 mg/dL — ABNORMAL HIGH (ref 65–99)

## 2015-03-10 LAB — TYPE AND SCREEN
ABO/RH(D): O POS
Antibody Screen: NEGATIVE

## 2015-03-10 IMAGING — DX DG KNEE 1-2V*R*
2 series · 2 of 2 positions shown · non-contrast
Comparison: None.

CLINICAL DATA: Postoperative right knee replacement

EXAM:
RIGHT KNEE - 1-2 VIEW

[knee ap]
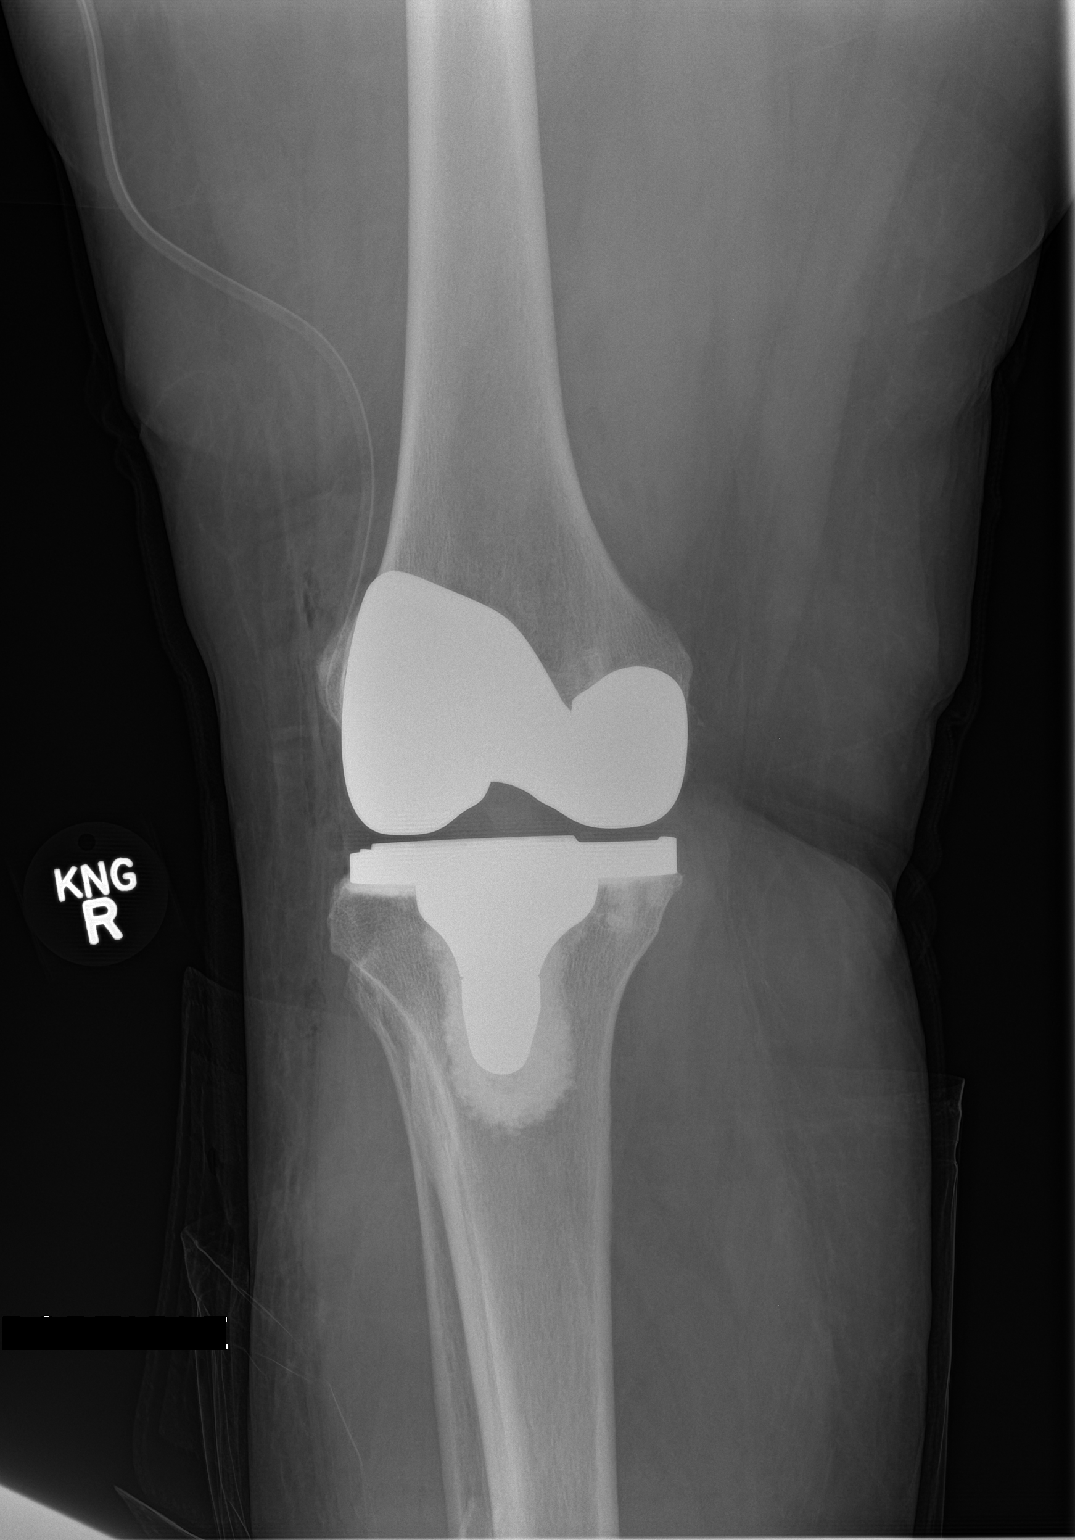

[knee lat]
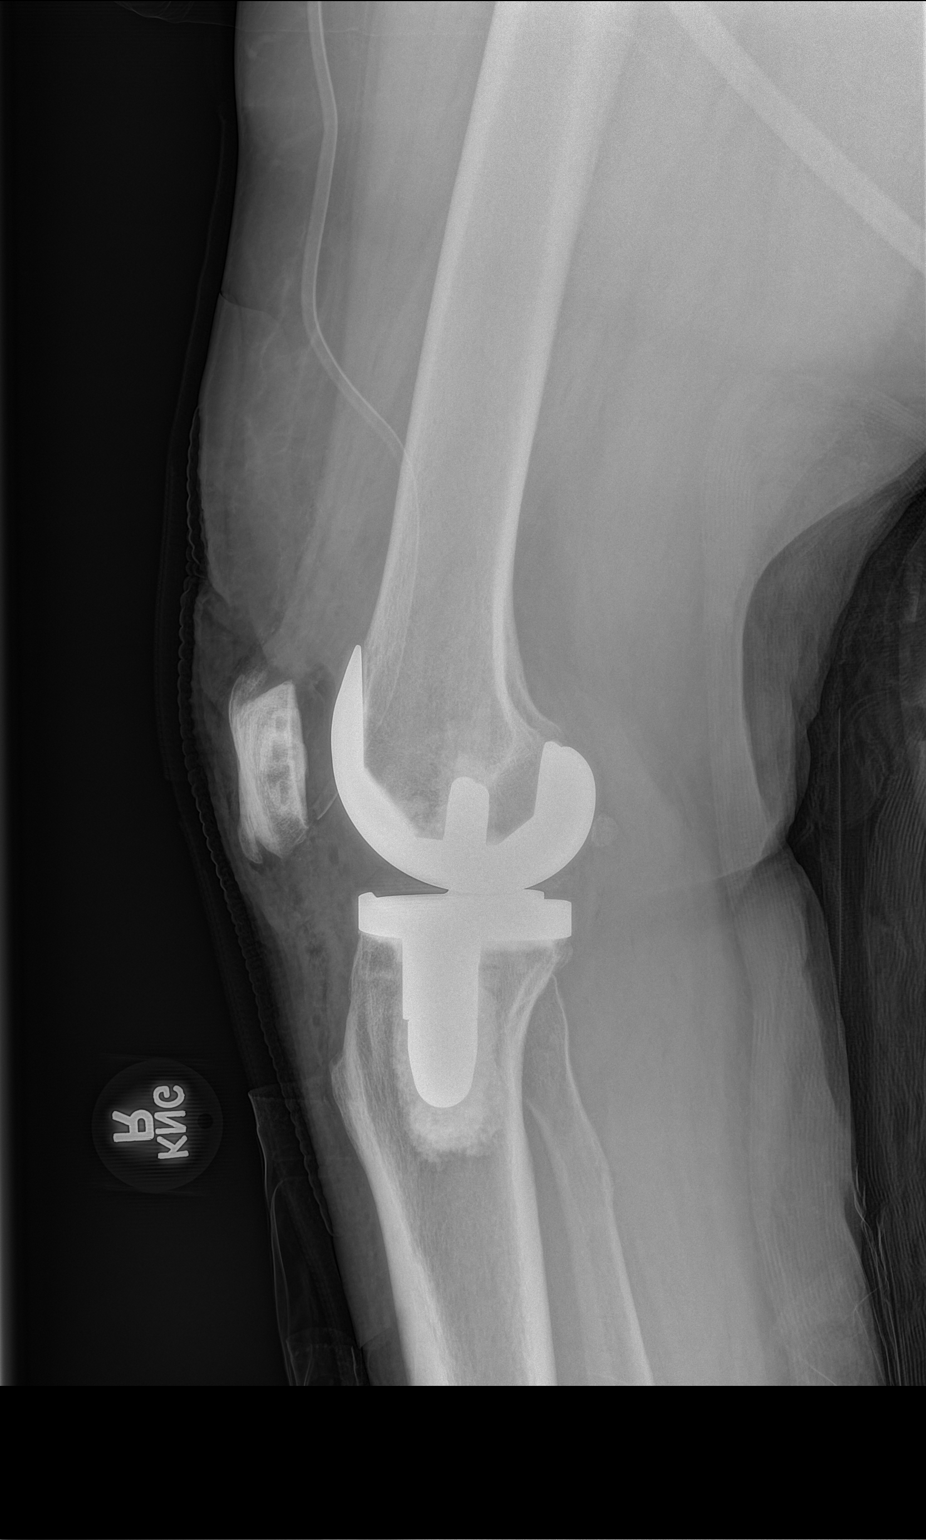

[2 of 2 positions shown; findings below may reference images not displayed]

FINDINGS: Right knee replacement is identified without malalignment.
Postsurgical changes including soft tissue air, swelling and drain
are noted.
IMPRESSION: Right knee replacement without malalignment. Postoperative changes
as described.

## 2015-03-10 SURGERY — ARTHROPLASTY, KNEE, TOTAL
Anesthesia: Spinal | Site: Knee | Laterality: Right

## 2015-03-10 MED ORDER — MENTHOL 3 MG MT LOZG: 1.0000 | LOZENGE | OROMUCOSAL | Status: DC | PRN

## 2015-03-10 MED ORDER — CHLORHEXIDINE GLUCONATE 4 % EX LIQD: 60.0000 mL | Freq: Once | CUTANEOUS | Status: DC

## 2015-03-10 MED ORDER — MIDAZOLAM HCL 2 MG/2ML IJ SOLN
INTRAMUSCULAR | Status: AC
  Filled 2015-03-10: qty 4

## 2015-03-10 MED ORDER — ONDANSETRON HCL 4 MG/2ML IJ SOLN: 4.0000 mg | Freq: Four times a day (QID) | INTRAMUSCULAR | Status: DC | PRN

## 2015-03-10 MED ORDER — DIPHENHYDRAMINE HCL 12.5 MG/5ML PO ELIX: 12.5000 mg | ORAL_SOLUTION | ORAL | Status: DC | PRN

## 2015-03-10 MED ORDER — PANTOPRAZOLE SODIUM 40 MG PO TBEC
40.0000 mg | DELAYED_RELEASE_TABLET | Freq: Every day | ORAL | Status: DC
  Administered 2015-03-11 – 2015-03-12 (×2): 40 mg via ORAL
  Filled 2015-03-10 (×2): qty 1

## 2015-03-10 MED ORDER — FENTANYL CITRATE (PF) 100 MCG/2ML IJ SOLN
INTRAMUSCULAR | Status: AC
  Filled 2015-03-10: qty 4

## 2015-03-10 MED ORDER — ACETAMINOPHEN 650 MG RE SUPP: 650.0000 mg | Freq: Four times a day (QID) | RECTAL | Status: DC | PRN

## 2015-03-10 MED ORDER — MIDAZOLAM HCL 5 MG/5ML IJ SOLN
INTRAMUSCULAR | Status: DC | PRN
  Administered 2015-03-10: 2 mg via INTRAVENOUS
  Administered 2015-03-10: 1 mg via INTRAVENOUS

## 2015-03-10 MED ORDER — CEFAZOLIN SODIUM-DEXTROSE 2-3 GM-% IV SOLR
INTRAVENOUS | Status: AC
  Filled 2015-03-10: qty 50

## 2015-03-10 MED ORDER — BUPIVACAINE HCL (PF) 0.5 % IJ SOLN
INTRAMUSCULAR | Status: DC | PRN
  Administered 2015-03-10: 2.4 mL

## 2015-03-10 MED ORDER — PROPOFOL 10 MG/ML IV BOLUS
INTRAVENOUS | Status: AC
  Filled 2015-03-10: qty 20

## 2015-03-10 MED ORDER — TRANEXAMIC ACID 1000 MG/10ML IV SOLN
1000.0000 mg | INTRAVENOUS | Status: AC
  Administered 2015-03-10: 1000 mg via INTRAVENOUS
  Filled 2015-03-10: qty 10

## 2015-03-10 MED ORDER — ALPRAZOLAM 0.5 MG PO TABS
0.5000 mg | ORAL_TABLET | Freq: Two times a day (BID) | ORAL | Status: DC
  Administered 2015-03-10 – 2015-03-12 (×4): 0.5 mg via ORAL
  Filled 2015-03-10 (×4): qty 1

## 2015-03-10 MED ORDER — PROPOFOL 500 MG/50ML IV EMUL
INTRAVENOUS | Status: DC | PRN
  Administered 2015-03-10: 100 ug/kg/min via INTRAVENOUS

## 2015-03-10 MED ORDER — LIDOCAINE HCL (CARDIAC) 20 MG/ML IV SOLN
INTRAVENOUS | Status: DC | PRN
  Administered 2015-03-10: 50 mg via INTRAVENOUS

## 2015-03-10 MED ORDER — GLYCOPYRROLATE 0.2 MG/ML IJ SOLN
INTRAMUSCULAR | Status: DC | PRN
  Administered 2015-03-10: 0.2 mg via INTRAVENOUS

## 2015-03-10 MED ORDER — HYDROMORPHONE HCL 2 MG/ML IJ SOLN
INTRAMUSCULAR | Status: AC
  Filled 2015-03-10: qty 1

## 2015-03-10 MED ORDER — SENNA 8.6 MG PO TABS
2.0000 | ORAL_TABLET | Freq: Every day | ORAL | Status: DC
  Administered 2015-03-10 – 2015-03-11 (×2): 17.2 mg via ORAL

## 2015-03-10 MED ORDER — PREGABALIN 100 MG PO CAPS
100.0000 mg | ORAL_CAPSULE | Freq: Three times a day (TID) | ORAL | Status: DC
  Administered 2015-03-10 – 2015-03-12 (×5): 100 mg via ORAL
  Filled 2015-03-10 (×5): qty 1

## 2015-03-10 MED ORDER — KETOROLAC TROMETHAMINE 30 MG/ML IJ SOLN
INTRAMUSCULAR | Status: AC
  Filled 2015-03-10: qty 1

## 2015-03-10 MED ORDER — MEPERIDINE HCL 50 MG/ML IJ SOLN: 6.2500 mg | INTRAMUSCULAR | Status: DC | PRN

## 2015-03-10 MED ORDER — BUPIVACAINE-EPINEPHRINE (PF) 0.25% -1:200000 IJ SOLN
INTRAMUSCULAR | Status: AC
  Filled 2015-03-10: qty 30

## 2015-03-10 MED ORDER — ANASTROZOLE 1 MG PO TABS
1.0000 mg | ORAL_TABLET | Freq: Every day | ORAL | Status: DC
  Administered 2015-03-11 – 2015-03-12 (×2): 1 mg via ORAL
  Filled 2015-03-10 (×3): qty 1

## 2015-03-10 MED ORDER — BUPIVACAINE HCL (PF) 0.5 % IJ SOLN
INTRAMUSCULAR | Status: AC
  Filled 2015-03-10: qty 30

## 2015-03-10 MED ORDER — DEXAMETHASONE SODIUM PHOSPHATE 10 MG/ML IJ SOLN
10.0000 mg | Freq: Once | INTRAMUSCULAR | Status: AC
  Administered 2015-03-11: 10 mg via INTRAVENOUS
  Filled 2015-03-10: qty 1

## 2015-03-10 MED ORDER — SODIUM CHLORIDE 0.9 % IV SOLN
INTRAVENOUS | Status: DC
  Administered 2015-03-10: 150 mL/h via INTRAVENOUS
  Administered 2015-03-11: 01:00:00 via INTRAVENOUS

## 2015-03-10 MED ORDER — TRIAMTERENE-HCTZ 37.5-25 MG PO CAPS
1.0000 | ORAL_CAPSULE | Freq: Every day | ORAL | Status: DC
  Administered 2015-03-12: 1 via ORAL
  Filled 2015-03-10 (×2): qty 1

## 2015-03-10 MED ORDER — DOCUSATE SODIUM 100 MG PO CAPS
100.0000 mg | ORAL_CAPSULE | Freq: Two times a day (BID) | ORAL | Status: DC
  Administered 2015-03-10 – 2015-03-11 (×3): 100 mg via ORAL

## 2015-03-10 MED ORDER — SODIUM CHLORIDE 0.9 % IV SOLN: INTRAVENOUS | Status: DC

## 2015-03-10 MED ORDER — METOCLOPRAMIDE HCL 5 MG/ML IJ SOLN: 5.0000 mg | Freq: Three times a day (TID) | INTRAMUSCULAR | Status: DC | PRN

## 2015-03-10 MED ORDER — SODIUM CHLORIDE 0.9 % IJ SOLN
INTRAMUSCULAR | Status: DC | PRN
Start: 2015-03-10 — End: 2015-03-10
  Administered 2015-03-10: 30 mL

## 2015-03-10 MED ORDER — CEFAZOLIN SODIUM-DEXTROSE 2-3 GM-% IV SOLR
2.0000 g | INTRAVENOUS | Status: AC
  Administered 2015-03-10: 2 g via INTRAVENOUS

## 2015-03-10 MED ORDER — OXYCODONE HCL ER 10 MG PO T12A
10.0000 mg | EXTENDED_RELEASE_TABLET | Freq: Two times a day (BID) | ORAL | Status: DC
  Administered 2015-03-10 – 2015-03-12 (×4): 10 mg via ORAL
  Filled 2015-03-10 (×4): qty 1

## 2015-03-10 MED ORDER — METOCLOPRAMIDE HCL 10 MG PO TABS
5.0000 mg | ORAL_TABLET | Freq: Three times a day (TID) | ORAL | Status: DC | PRN
Start: 2015-03-10 — End: 2015-03-12

## 2015-03-10 MED ORDER — KETOROLAC TROMETHAMINE 15 MG/ML IJ SOLN
15.0000 mg | Freq: Four times a day (QID) | INTRAMUSCULAR | Status: AC
  Administered 2015-03-10 – 2015-03-11 (×4): 15 mg via INTRAVENOUS
  Filled 2015-03-10 (×5): qty 1

## 2015-03-10 MED ORDER — LABETALOL HCL 5 MG/ML IV SOLN
INTRAVENOUS | Status: DC | PRN
  Administered 2015-03-10: 2.5 mg via INTRAVENOUS

## 2015-03-10 MED ORDER — BUPIVACAINE-EPINEPHRINE (PF) 0.25% -1:200000 IJ SOLN
INTRAMUSCULAR | Status: DC | PRN
  Administered 2015-03-10: 30 mL

## 2015-03-10 MED ORDER — HYDROGEN PEROXIDE 3 % EX SOLN
CUTANEOUS | Status: DC | PRN
  Administered 2015-03-10: 1

## 2015-03-10 MED ORDER — HYDROCODONE-ACETAMINOPHEN 5-325 MG PO TABS
1.0000 | ORAL_TABLET | ORAL | Status: DC | PRN
  Administered 2015-03-10: 1 via ORAL
  Administered 2015-03-11 – 2015-03-12 (×6): 2 via ORAL
  Filled 2015-03-10 (×5): qty 2
  Filled 2015-03-10: qty 1
  Filled 2015-03-10: qty 2

## 2015-03-10 MED ORDER — HYDROGEN PEROXIDE 3 % EX SOLN
CUTANEOUS | Status: AC
  Filled 2015-03-10: qty 473

## 2015-03-10 MED ORDER — PHENYLEPHRINE HCL 10 MG/ML IJ SOLN
10.0000 mg | INTRAVENOUS | Status: DC | PRN
  Administered 2015-03-10: 50 ug/min via INTRAVENOUS

## 2015-03-10 MED ORDER — DEXAMETHASONE SODIUM PHOSPHATE 10 MG/ML IJ SOLN
INTRAMUSCULAR | Status: AC
  Filled 2015-03-10: qty 1

## 2015-03-10 MED ORDER — PROPOFOL 10 MG/ML IV BOLUS
INTRAVENOUS | Status: DC | PRN
  Administered 2015-03-10: 40 mg via INTRAVENOUS

## 2015-03-10 MED ORDER — METHOCARBAMOL 500 MG PO TABS
500.0000 mg | ORAL_TABLET | Freq: Four times a day (QID) | ORAL | Status: DC | PRN
  Administered 2015-03-11 – 2015-03-12 (×2): 500 mg via ORAL
  Filled 2015-03-10 (×2): qty 1

## 2015-03-10 MED ORDER — 0.9 % SODIUM CHLORIDE (POUR BTL) OPTIME
TOPICAL | Status: DC | PRN
  Administered 2015-03-10: 1000 mL

## 2015-03-10 MED ORDER — FENTANYL CITRATE (PF) 100 MCG/2ML IJ SOLN: 25.0000 ug | INTRAMUSCULAR | Status: DC | PRN

## 2015-03-10 MED ORDER — HYDROMORPHONE HCL 1 MG/ML IJ SOLN
0.5000 mg | INTRAMUSCULAR | Status: DC | PRN
  Administered 2015-03-10: 0.5 mg via INTRAVENOUS
  Filled 2015-03-10: qty 1

## 2015-03-10 MED ORDER — LACTATED RINGERS IV SOLN
INTRAVENOUS | Status: DC
  Administered 2015-03-10 (×3): via INTRAVENOUS

## 2015-03-10 MED ORDER — PHENOL 1.4 % MT LIQD: 1.0000 | OROMUCOSAL | Status: DC | PRN

## 2015-03-10 MED ORDER — CEFAZOLIN SODIUM-DEXTROSE 2-3 GM-% IV SOLR
2.0000 g | Freq: Four times a day (QID) | INTRAVENOUS | Status: AC
  Administered 2015-03-10 – 2015-03-11 (×2): 2 g via INTRAVENOUS
  Filled 2015-03-10 (×2): qty 50

## 2015-03-10 MED ORDER — ONDANSETRON HCL 4 MG PO TABS
4.0000 mg | ORAL_TABLET | Freq: Four times a day (QID) | ORAL | Status: DC | PRN
  Administered 2015-03-12: 4 mg via ORAL
  Filled 2015-03-10: qty 1

## 2015-03-10 MED ORDER — FENTANYL CITRATE (PF) 100 MCG/2ML IJ SOLN
INTRAMUSCULAR | Status: DC | PRN
  Administered 2015-03-10 (×2): 50 ug via INTRAVENOUS
  Administered 2015-03-10: 100 ug via INTRAVENOUS

## 2015-03-10 MED ORDER — METHOCARBAMOL 1000 MG/10ML IJ SOLN
500.0000 mg | Freq: Four times a day (QID) | INTRAVENOUS | Status: DC | PRN
  Administered 2015-03-10: 500 mg via INTRAVENOUS
  Filled 2015-03-10 (×2): qty 5

## 2015-03-10 MED ORDER — ACETAMINOPHEN 10 MG/ML IV SOLN
INTRAVENOUS | Status: AC
  Filled 2015-03-10: qty 100

## 2015-03-10 MED ORDER — PHENYLEPHRINE HCL 10 MG/ML IJ SOLN
INTRAMUSCULAR | Status: AC
  Filled 2015-03-10: qty 1

## 2015-03-10 MED ORDER — ACETAMINOPHEN 325 MG PO TABS: 650.0000 mg | ORAL_TABLET | Freq: Four times a day (QID) | ORAL | Status: DC | PRN

## 2015-03-10 MED ORDER — SODIUM CHLORIDE 0.9 % IJ SOLN
INTRAMUSCULAR | Status: AC
  Filled 2015-03-10: qty 50

## 2015-03-10 MED ORDER — FENTANYL CITRATE (PF) 100 MCG/2ML IJ SOLN
INTRAMUSCULAR | Status: AC
  Filled 2015-03-10: qty 2

## 2015-03-10 MED ORDER — ALUM & MAG HYDROXIDE-SIMETH 200-200-20 MG/5ML PO SUSP: 30.0000 mL | ORAL | Status: DC | PRN

## 2015-03-10 MED ORDER — ACETAMINOPHEN 10 MG/ML IV SOLN
INTRAVENOUS | Status: DC | PRN
  Administered 2015-03-10: 1000 mg via INTRAVENOUS

## 2015-03-10 MED ORDER — APIXABAN 2.5 MG PO TABS
2.5000 mg | ORAL_TABLET | Freq: Two times a day (BID) | ORAL | Status: DC
  Administered 2015-03-11 – 2015-03-12 (×3): 2.5 mg via ORAL
  Filled 2015-03-10 (×5): qty 1

## 2015-03-10 MED ORDER — SODIUM CHLORIDE 0.9 % IR SOLN
Status: DC | PRN
  Administered 2015-03-10: 100 mL

## 2015-03-10 MED ORDER — HYDROMORPHONE HCL 1 MG/ML IJ SOLN
INTRAMUSCULAR | Status: DC | PRN
  Administered 2015-03-10 (×2): 1 mg via INTRAVENOUS

## 2015-03-10 SURGICAL SUPPLY — 63 items
BAG SPEC THK2 15X12 ZIP CLS (MISCELLANEOUS)
BAG ZIPLOCK 12X15 (MISCELLANEOUS) IMPLANT
BANDAGE ELASTIC 4 VELCRO ST LF (GAUZE/BANDAGES/DRESSINGS) ×2 IMPLANT
BANDAGE ELASTIC 6 VELCRO ST LF (GAUZE/BANDAGES/DRESSINGS) ×2 IMPLANT
BANDAGE ESMARK 6X9 LF (GAUZE/BANDAGES/DRESSINGS) ×1 IMPLANT
BLADE SAW RECIPROCATING 77.5 (BLADE) ×2 IMPLANT
BNDG CMPR 9X6 STRL LF SNTH (GAUZE/BANDAGES/DRESSINGS) ×1
BNDG ESMARK 6X9 LF (GAUZE/BANDAGES/DRESSINGS) ×2
BONE CEMENT SIMPLEX TOBRAMYCIN (Cement) ×8 IMPLANT
BOWL SMART MIX CTS (DISPOSABLE) ×2 IMPLANT
CAPT KNEE TOTAL 3 ×2 IMPLANT
CEMENT BONE SIMPLEX TOBRAMYCIN (Cement) ×4 IMPLANT
CHLORAPREP W/TINT 26ML (MISCELLANEOUS) ×4 IMPLANT
CUFF TOURN SGL QUICK 34 (TOURNIQUET CUFF) ×2
CUFF TRNQT CYL 34X4X40X1 (TOURNIQUET CUFF) ×1 IMPLANT
DECANTER SPIKE VIAL GLASS SM (MISCELLANEOUS) ×2 IMPLANT
DRAPE EXTREMITY T 121X128X90 (DRAPE) ×2 IMPLANT
DRAPE LG THREE QUARTER DISP (DRAPES) ×4 IMPLANT
DRAPE POUCH INSTRU U-SHP 10X18 (DRAPES) ×2 IMPLANT
DRAPE U-SHAPE 47X51 STRL (DRAPES) ×2 IMPLANT
DRSG AQUACEL AG ADV 3.5X10 (GAUZE/BANDAGES/DRESSINGS) ×2 IMPLANT
DRSG TEGADERM 4X4.75 (GAUZE/BANDAGES/DRESSINGS) ×2 IMPLANT
ELECT PENCIL ROCKER SW 15FT (MISCELLANEOUS) IMPLANT
ELECT REM PT RETURN 15FT ADLT (MISCELLANEOUS) ×2 IMPLANT
EVACUATOR 1/8 PVC DRAIN (DRAIN) ×2 IMPLANT
FACESHIELD WRAPAROUND (MASK) ×6 IMPLANT
GAUZE SPONGE 4X4 12PLY STRL (GAUZE/BANDAGES/DRESSINGS) ×2 IMPLANT
GLOVE BIO SURGEON STRL SZ8.5 (GLOVE) ×4 IMPLANT
GLOVE BIOGEL PI IND STRL 8.5 (GLOVE) ×1 IMPLANT
GLOVE BIOGEL PI INDICATOR 8.5 (GLOVE) ×1
GOWN SPEC L3 XXLG W/TWL (GOWN DISPOSABLE) ×2 IMPLANT
HANDPIECE INTERPULSE COAX TIP (DISPOSABLE) ×2
HOOD PEEL AWAY FACE SHEILD DIS (HOOD) ×4 IMPLANT
KIT BASIN OR (CUSTOM PROCEDURE TRAY) ×2 IMPLANT
LIQUID BAND (GAUZE/BANDAGES/DRESSINGS) ×2 IMPLANT
MANIFOLD NEPTUNE II (INSTRUMENTS) ×2 IMPLANT
NEEDLE SPNL 18GX3.5 QUINCKE PK (NEEDLE) ×2 IMPLANT
PACK TOTAL JOINT (CUSTOM PROCEDURE TRAY) ×2 IMPLANT
PADDING CAST COTTON 6X4 STRL (CAST SUPPLIES) ×2 IMPLANT
PEN SKIN MARKING BROAD (MISCELLANEOUS) ×2 IMPLANT
POSITIONER SURGICAL ARM (MISCELLANEOUS) ×2 IMPLANT
SAW OSC TIP CART 19.5X105X1.3 (SAW) ×2 IMPLANT
SEALER BIPOLAR AQUA 6.0 (INSTRUMENTS) ×2 IMPLANT
SET HNDPC FAN SPRY TIP SCT (DISPOSABLE) ×1 IMPLANT
SET PAD KNEE POSITIONER (MISCELLANEOUS) ×2 IMPLANT
SOL PREP POV-IOD 4OZ 10% (MISCELLANEOUS) ×2 IMPLANT
SPONGE DRAIN TRACH 4X4 STRL 2S (GAUZE/BANDAGES/DRESSINGS) ×2 IMPLANT
SUCTION FRAZIER 12FR DISP (SUCTIONS) ×2 IMPLANT
SUT MNCRL AB 3-0 PS2 18 (SUTURE) ×2 IMPLANT
SUT MON AB 2-0 CT1 36 (SUTURE) ×4 IMPLANT
SUT VIC AB 1 CT1 36 (SUTURE) ×2 IMPLANT
SUT VIC AB 2-0 CT1 27 (SUTURE) ×2
SUT VIC AB 2-0 CT1 TAPERPNT 27 (SUTURE) ×1 IMPLANT
SUT VLOC 180 0 24IN GS25 (SUTURE) ×2 IMPLANT
SYR 50ML LL SCALE MARK (SYRINGE) ×2 IMPLANT
TOWEL OR 17X26 10 PK STRL BLUE (TOWEL DISPOSABLE) ×4 IMPLANT
TOWEL OR NON WOVEN STRL DISP B (DISPOSABLE) ×2 IMPLANT
TOWER CARTRIDGE SMART MIX (DISPOSABLE) ×2 IMPLANT
TRAY FOLEY W/METER SILVER 14FR (SET/KITS/TRAYS/PACK) ×2 IMPLANT
TRAY FOLEY W/METER SILVER 16FR (SET/KITS/TRAYS/PACK) IMPLANT
WATER STERILE IRR 1500ML POUR (IV SOLUTION) ×2 IMPLANT
WRAP KNEE MAXI GEL POST OP (GAUZE/BANDAGES/DRESSINGS) ×2 IMPLANT
YANKAUER SUCT BULB TIP 10FT TU (MISCELLANEOUS) ×2 IMPLANT

## 2015-03-10 NOTE — Op Note (Signed)
OPERATIVE REPORT  SURGEON: Rod Can, MD   ASSISTANT: Jennette Bill, PA.  PREOPERATIVE DIAGNOSIS: Right knee arthritis.   POSTOPERATIVE DIAGNOSIS: Right knee arthritis.   PROCEDURE: Right total knee arthroplasty.   IMPLANTS: Stryker Triathlon CR femur, size 3. Stryker universal tibia, size 3. X3 polyethelyene insert, size 9 mm, CS. 3 button asymmetric patella, size 32 mm. Simplex P bone cement.  ANESTHESIA:  Spinal  ESTIMATED BLOOD LOSS: 150 mL.  ANTIBIOTICS: 2g ancef.  DRAINS: Medium HV x1.  COMPLICATIONS: None   CONDITION: PACU - hemodynamically stable.   BRIEF CLINICAL NOTE: Kelsey Wilson is a 54 y.o. female with a long-standing history of Right knee arthritis. After failing conservative management, the patient was indicated for total knee arthroplasty. The risks, benefits, and alternatives to the procedure were explained, and the patient elected to proceed.  PROCEDURE IN DETAIL: Spinal anesthesia was obtained in the pre-op holding area. Once inside the operative room, a foley catheter was inserted. The patient was then positioned, a nonsterile tourniquet was placed, and the lower extremity was prepped and draped in the normal sterile surgical fashion. A time-out was called verifying side and site of surgery. The patient received IV antibiotics within 60 minutes of beginning the procedure. The extremity was exsanguinated with an Esmarch, and the tourniquet was inflated.  An anterior approach to the knee was performed utilizing a midvastus arthrotomy. A medial release was performed and the patellar fat pad was excised. A 4-degree distal femur valgus cut was made with an intramedullary guide. A freehand patellar resection was performed, and the patella was sized an prepared with 3 lug holes.  The proximal tibial resection was performed using an extramedullary guide, leaving a bone island for the PCL. The menisci were excised. A spacer block was placed, and the  alignment and balance in extension were confirmed.   The distal femur was sized using the 3-degree external rotation guide referencing the posterior femoral cortex. The appropriate 4-in-1 cutting block was pinned into place, and the remaining femoral cuts were performed, taking care to protect the MCL.  The tibia was sized and the trial tray was pinned into place. The remaining trail components were inserted. The knee was stable to varus and valgus stress through a full range of motion. The patella tracked centrally, and the PCL was well balanced. The trial components were removed, and the proximal tibial surface was prepared. Small drill holes were made in the sclerotic subchondral bone.The cut bony surfaces were irrigated with pulse lavage. Final components were cemented into place and excess cement was cleared. The trial insert was placed, and the knee was brought into extension while the cement polymerized. Once the cement was hard, the knee was tested for a final time and found to be well balanced. The trial insert was exchanged for the real polyethylene insert.  The wound was copiously irrigated with a dilute betadine solution followed by normal saline with pulse lavage. Marcaine solution was injected into the periarticular soft tissue. The wound was closed in layers using #1 Vicryl and V-Loc for the fascia, 2-0 Vicryl for the subcutaneous fat, 2-0 Monocryl for the deep dermal layer, 3-0 running Monocryl subcuticular Stitch, and Dermabond for the skin. Once the glue was fully dried, an Aquacell Ag and compressive dressing were applied. The tourniquet was let down, and the patient was transported to the recovery room in stable ondition. Sponge, needle, and instrument counts were correct at the end of the case x2. The patient tolerated the procedure well  and there were no known complications.  Please note that a surgical assistant was a medical necessity for this procedure in order to perform it  in a safe and expeditious manner. Surgical assistant was necessary to retract the ligaments and vital neurovascular structures to prevent injury to them and also necessary for proper positioning of the limb to allow for anatomic placement of the prosthesis.

## 2015-03-10 NOTE — Anesthesia Procedure Notes (Signed)
Spinal Patient location during procedure: OR End time: 03/10/2015 1:12 PM Staffing Resident/CRNA: EVANS, JANET E Performed by: anesthesiologist  Preanesthetic Checklist Completed: patient identified, site marked, surgical consent, pre-op evaluation, timeout performed, IV checked, risks and benefits discussed and monitors and equipment checked Spinal Block Patient position: sitting Prep: Betadine Patient monitoring: heart rate, continuous pulse ox and blood pressure Location: L3-4 Injection technique: single-shot Needle Needle type: Spinocan and Quincke  Needle gauge: 22 G Needle length: 9 cm Assessment Sensory level: T4 Additional Notes Expiration date of kit checked and confirmed. Patient tolerated procedure well, without complications.CSF x 3 . Negative heme or paresthesia     

## 2015-03-10 NOTE — Anesthesia Preprocedure Evaluation (Addendum)
Anesthesia Evaluation  Patient identified by MRN, date of birth, ID band Patient awake    Reviewed: Allergy & Precautions, NPO status , Patient's Chart, lab work & pertinent test results, reviewed documented beta blocker date and time   Airway Mallampati: II       Dental  (+) Missing, Dental Advisory Given   Pulmonary neg pulmonary ROS,    breath sounds clear to auscultation       Cardiovascular  Rhythm:Regular  ECHO 2013 EF 60%, EKG OK   Neuro/Psych  Headaches, Anxiety Depression    GI/Hepatic Neg liver ROS, GERD  Medicated,  Endo/Other  negative endocrine ROS  Renal/GU negative Renal ROS     Musculoskeletal  (+) Fibromyalgia -  Abdominal (+)  Abdomen: soft.    Peds  Hematology 12/37  plts 174  INR .94   Anesthesia Other Findings   Reproductive/Obstetrics                           Anesthesia Physical Anesthesia Plan  ASA: III  Anesthesia Plan: Spinal   Post-op Pain Management:    Induction:   Airway Management Planned: Nasal Cannula  Additional Equipment:   Intra-op Plan:   Post-operative Plan:   Informed Consent: I have reviewed the patients History and Physical, chart, labs and discussed the procedure including the risks, benefits and alternatives for the proposed anesthesia with the patient or authorized representative who has indicated his/her understanding and acceptance.     Plan Discussed with:   Anesthesia Plan Comments:         Anesthesia Quick Evaluation

## 2015-03-10 NOTE — Discharge Summary (Signed)
Physician Discharge Summary  Patient ID: Kelsey Wilson MRN: [id number] DOB/AGE: 56-Jun-1962 76 y.o.  Admit date: 03/10/2015 Discharge date: 03/12/2015  Admission Diagnoses:  Primary osteoarthritis of right knee  Discharge Diagnoses:  Principal Problem:   Primary osteoarthritis of right knee   Past Medical History  Diagnosis Date  . Night sweats   . Fatigue   . Wears glasses   . Arthritis   . Hot flashes   . Blood transfusion   . Diverticulitis of colon   . Headache(784.0)   . Anemia   . Bursitis   . Carpal tunnel syndrome   . Fibromyalgia 08/2012  . Breast cancer ([facility])     T3N1 invasive ductal carcinoma left breast  . SOB (shortness of breath) on exertion     walking and stairs  . Pneumonia     hx of  . Anxiety   . Depression   . GERD (gastroesophageal reflux disease)     Surgeries: Procedure(s): RIGHT TOTAL KNEE ARTHROPLASTY on 03/10/2015   Consultants (if any):    Discharged Condition: Improved  Hospital Course: Kelsey Wilson is an 54 y.o. female who was admitted 03/10/2015 with a diagnosis of Primary osteoarthritis of right knee and went to the operating room on 03/10/2015 and underwent the above named procedures.    She was given perioperative antibiotics:      Anti-infectives    Start     Dose/Rate Route Frequency Ordered Stop   03/10/15 1830  ceFAZolin (ANCEF) IVPB 2 g/50 mL premix     2 g 100 mL/hr over 30 Minutes Intravenous Every 6 hours 03/10/15 1726 03/11/15 0117   03/10/15 0933  ceFAZolin (ANCEF) IVPB 2 g/50 mL premix     2 g 100 mL/hr over 30 Minutes Intravenous On call to O.R. 03/10/15 2122 03/10/15 1302    .  She was given sequential compression devices, early ambulation, and apixaban for DVT prophylaxis.  She benefited maximally from the hospital stay and there were no complications.    Recent vital signs:  Filed Vitals:   03/12/15 0532  BP: 111/69  Pulse: 62  Temp: 97.8 F (36.6 C)  Resp: 16    Recent laboratory studies:   Lab Results  Component Value Date   HGB 11.3* 03/12/2015   HGB 11.0* 03/11/2015   HGB 12.2 02/28/2015   Lab Results  Component Value Date   WBC 8.9 03/12/2015   PLT 191 03/12/2015   Lab Results  Component Value Date   INR 0.94 02/28/2015   Lab Results  Component Value Date   NA 138 03/11/2015   K 3.8 03/11/2015   CL 103 03/11/2015   CO2 28 03/11/2015   BUN 15 03/11/2015   CREATININE 0.72 03/11/2015   GLUCOSE 138* 03/11/2015    Discharge Medications:     Medication List    ASK your doctor about these medications        acetaminophen 500 MG tablet  Commonly known as:  TYLENOL  Take 500 mg by mouth as needed for pain.     ALPRAZolam 0.5 MG tablet  Commonly known as:  XANAX  Take 0.5 mg by mouth 2 (two) times daily.     anastrozole 1 MG tablet  Commonly known as:  ARIMIDEX  TAKE 1 TABLET BY MOUTH ONCE DAILY     docusate sodium 100 MG capsule  Commonly known as:  COLACE  Take 100 mg by mouth daily as needed for moderate constipation.     omeprazole 20  MG capsule  Commonly known as:  PRILOSEC  Take 20 mg by mouth daily.     pregabalin 100 MG capsule  Commonly known as:  LYRICA  Take 1 capsule (100 mg total) by mouth 3 (three) times daily.     traMADol 50 MG tablet  Commonly known as:  ULTRAM  Take 1 tablet (50 mg total) by mouth every 6 (six) hours as needed.     triamterene-hydrochlorothiazide 37.5-25 MG capsule  Commonly known as:  DYAZIDE  Take 1 capsule by mouth daily.     UNABLE TO FIND  Rx: E4975- Non-Silicone Breast Prosthesis (Quantity: 1) Dx: 174.9; Left Breast Lumpectomy        Diagnostic Studies: Dg Knee 1-2 Views Right  03/10/2015   CLINICAL DATA:  Postoperative right knee replacement  EXAM: RIGHT KNEE - 1-2 VIEW  COMPARISON:  None.  FINDINGS: Right knee replacement is identified without malalignment. Postsurgical changes including soft tissue air, swelling and drain are noted.  IMPRESSION: Right knee replacement without malalignment.  Postoperative changes as described.   Electronically Signed   By: Abelardo Diesel M.D.   On: 03/10/2015 16:37    Disposition: 06-Home-Health Care Svc    Follow-up Information    Follow up with Asees Manfredi, Horald Pollen, MD In 2 weeks.   Specialty:  Orthopedic Surgery   Why:  For wound re-check   Contact information:   [address] [place] [address] [phone]        Signed: Elie Goody 03/12/2015, 8:26 AM

## 2015-03-10 NOTE — Anesthesia Postprocedure Evaluation (Signed)
  Anesthesia Post-op Note  Patient: Kelsey Wilson  Procedure(s) Performed: Procedure(s) (LRB): RIGHT TOTAL KNEE ARTHROPLASTY (Right)  Patient Location: PACU  Anesthesia Type: Spinal  Level of Consciousness: awake and alert   Airway and Oxygen Therapy: Patient Spontanous Breathing  Post-op Pain: mild  Post-op Assessment: Post-op Vital signs reviewed, Patient's Cardiovascular Status Stable, Respiratory Function Stable, Patent Airway and No signs of Nausea or vomiting  Last Vitals:  Filed Vitals:   03/10/15 1728  BP: 106/60  Pulse: 58  Temp: 36.6 C  Resp: 16    Post-op Vital Signs: stable   Complications: No apparent anesthesia complications

## 2015-03-10 NOTE — H&P (View-Only) (Signed)
TOTAL KNEE ADMISSION H&P  Patient is being admitted for right total knee arthroplasty.  Subjective:  Chief Complaint:right knee pain.  HPI: Kelsey Wilson, 54 y.o. female, has a history of pain and functional disability in the right knee due to arthritis and has failed non-surgical conservative treatments for greater than 12 weeks to includeNSAID's and/or analgesics, corticosteriod injections, flexibility and strengthening excercises, use of assistive devices, weight reduction as appropriate and activity modification.  Onset of symptoms was gradual, starting 2 years ago with gradually worsening course since that time. The patient noted no past surgery on the right knee(s).  Patient currently rates pain in the right knee(s) at 10 out of 10 with activity. Patient has night pain, worsening of pain with activity and weight bearing, pain that interferes with activities of daily living, pain with passive range of motion, crepitus and joint swelling.  Patient has evidence of subchondral cysts, subchondral sclerosis, periarticular osteophytes and joint space narrowing by imaging studies.  There is no active infection.  Patient Active Problem List   Diagnosis Date Noted  . Osteoarthritis of right knee 02/04/2015  . Primary osteoarthritis of left knee 09/13/2014  . Hot flashes 01/19/2014  . Abdominal pain, unspecified site 01/19/2014  . Breast cancer of upper-outer quadrant of left female breast 03/19/2013  . Lumbosacral spondylosis without myelopathy 12/30/2012  . Fibromyalgia syndrome 08/15/2012  . Peripheral neuropathy, toxic 08/15/2012  . Lymphedema of arm - left 04/30/2012   Past Medical History  Diagnosis Date  . Night sweats   . Fatigue   . Wears glasses   . Arthritis   . Hot flashes   . Blood transfusion   . Diverticulitis of colon   . Headache(784.0)   . Anemia   . Bursitis   . Carpal tunnel syndrome   . Fibromyalgia 08/2012  . Breast cancer     T3N1 invasive ductal carcinoma  left breast  . SOB (shortness of breath) on exertion     walking and stairs  . Pneumonia     hx of  . Anxiety   . Depression   . GERD (gastroesophageal reflux disease)     Past Surgical History  Procedure Laterality Date  . Cholecystectomy    . Appendectomy    . Carpal tunnel release      Bilateral  . Tonsillectomy    . Spur      Apex spur on both big toes  . Knee surgery      Left Knee  . Portacath placement  06/18/2011    Procedure: INSERTION PORT-A-CATH;  Surgeon: Edward Jolly, MD;  Location: [facility];  Service: General;  Laterality: Right;  right subclavian  . Abdominal hysterectomy      still has ovaries  . Axillary lymph node dissection  11/28/2011    Procedure: AXILLARY LYMPH NODE DISSECTION;  Surgeon: Edward Jolly, MD;  Location: [facility];  Service: General;  Laterality: Left;  . Toe surgery Bilateral   . Breast surgery Left 2013  . Eye surgery Left     cataract removal  . Total knee arthroplasty Left 09/13/2014  . Total knee arthroplasty Left 09/13/2014    Procedure: LEFT TOTAL KNEE ARTHROPLASTY;  Surgeon: Rod Can, MD;  Location: [facility];  Service: Orthopedics;  Laterality: Left;     (Not in a hospital admission) Allergies  Allergen Reactions  . Compazine Other (See Comments)    Numbness of face and lips  . Aleve [Naproxen] Nausea Only  . Oxycodone  hallucinations  . Penicillins Nausea Only    Social History  Substance Use Topics  . Smoking status: Never Smoker   . Smokeless tobacco: Never Used  . Alcohol Use: No    Family History  Problem Relation Age of Onset  . Cancer Paternal Grandmother     unknown  . Hypertension Mother   . Diabetes Mother   . Hypertension Father   . Diabetes Father      Review of Systems  Constitutional: Negative.   HENT: Negative.   Eyes: Negative.   Cardiovascular: Negative.   Gastrointestinal: Negative.   Genitourinary: Negative.   Musculoskeletal: Positive for back pain and joint  pain.  Skin: Negative.   Neurological: Negative.   Endo/Heme/Allergies: Negative.   Psychiatric/Behavioral: Negative.     Objective:  Physical Exam  Constitutional: She is oriented to person, place, and time. She appears well-developed and well-nourished.  HENT:  Head: Normocephalic and atraumatic.  Eyes: Conjunctivae and EOM are normal. Pupils are equal, round, and reactive to light.  Neck: Normal range of motion. Neck supple.  Cardiovascular: Normal rate, regular rhythm and intact distal pulses.   Respiratory: Effort normal and breath sounds normal. No respiratory distress.  GI: Soft. Bowel sounds are normal. She exhibits no distension.  Genitourinary:  deferred  Musculoskeletal:       Right knee: She exhibits decreased range of motion, swelling and deformity. Tenderness found. Medial joint line and lateral joint line tenderness noted.  Neurological: She is alert and oriented to person, place, and time. She has normal reflexes.  Skin: Skin is warm and dry.  Psychiatric: She has a normal mood and affect. Her behavior is normal. Judgment and thought content normal.    Vital signs in last 24 hours: @VSRANGES @  Labs:   Estimated body mass index is 42.72 kg/(m^2) as calculated from the following:   Height as of 12/15/14: 5\' 3"  (1.6 m).   Weight as of 12/15/14: 109.362 kg (241 lb 1.6 oz).   Imaging Review Plain radiographs demonstrate severe degenerative joint disease of the right knee(s). The overall alignment ismild varus. The bone quality appears to be adequate for age and reported activity level.  Assessment/Plan:  End stage arthritis, right knee   The patient history, physical examination, clinical judgment of the provider and imaging studies are consistent with end stage degenerative joint disease of the right knee(s) and total knee arthroplasty is deemed medically necessary. The treatment options including medical management, injection therapy arthroscopy and  arthroplasty were discussed at length. The risks and benefits of total knee arthroplasty were presented and reviewed. The risks due to aseptic loosening, infection, stiffness, patella tracking problems, thromboembolic complications and other imponderables were discussed. The patient acknowledged the explanation, agreed to proceed with the plan and consent was signed. Patient is being admitted for inpatient treatment for surgery, pain control, PT, OT, prophylactic antibiotics, VTE prophylaxis, progressive ambulation and ADL's and discharge planning. The patient is planning to be discharged home with home health services

## 2015-03-10 NOTE — Interval H&P Note (Signed)
History and Physical Interval Note:  03/10/2015 12:27 PM  Kelsey Wilson  has presented today for surgery, with the diagnosis of right knee osteoarthritis  The various methods of treatment have been discussed with the patient and family. After consideration of risks, benefits and other options for treatment, the patient has consented to  Procedure(s): RIGHT TOTAL KNEE ARTHROPLASTY (Right) as a surgical intervention .  The patient's history has been reviewed, patient examined, no change in status, stable for surgery.  I have reviewed the patient's chart and labs.  Questions were answered to the patient's satisfaction.     Irlene Crudup, Horald Pollen

## 2015-03-10 NOTE — Transfer of Care (Signed)
Immediate Anesthesia Transfer of Care Note  Patient: Kelsey Wilson  Procedure(s) Performed: Procedure(s): RIGHT TOTAL KNEE ARTHROPLASTY (Right)  Patient Location: PACU  Anesthesia Type:Spinal  Level of Consciousness: awake, alert , oriented and patient cooperative  Airway & Oxygen Therapy: Patient Spontanous Breathing and Patient connected to face mask oxygen  Post-op Assessment: Report given to RN, Post -op Vital signs reviewed and stable and Patient moving all extremities X 4  Post vital signs: stable  Last Vitals:  Filed Vitals:   03/10/15 0930  BP: 129/75  Pulse: 80  Temp: 36.6 C  Resp: 18    Complications: No apparent anesthesia complications  No spinal , moving all extremities

## 2015-03-10 NOTE — Progress Notes (Signed)
Utilization review completed.  

## 2015-03-11 LAB — BASIC METABOLIC PANEL
Anion gap: 7 (ref 5–15)
BUN: 15 mg/dL (ref 6–20)
CO2: 28 mmol/L (ref 22–32)
Calcium: 9 mg/dL (ref 8.9–10.3)
Chloride: 103 mmol/L (ref 101–111)
Creatinine, Ser: 0.72 mg/dL (ref 0.44–1.00)
GFR calc Af Amer: >60 mL/min (ref >60)
GFR calc non Af Amer: >60 mL/min (ref >60)
Glucose, Bld: 138 mg/dL — ABNORMAL HIGH (ref 65–99)
Potassium: 3.8 mmol/L (ref 3.5–5.1)
Sodium: 138 mmol/L (ref 135–145)

## 2015-03-11 LAB — CBC
HCT: 34.2 % — ABNORMAL LOW (ref 36.0–46.0)
Hemoglobin: 11 g/dL — ABNORMAL LOW (ref 12.0–15.0)
MCH: 27.4 pg (ref 26.0–34.0)
MCHC: 32.2 g/dL (ref 30.0–36.0)
MCV: 85.1 fL (ref 78.0–100.0)
Platelets: 159 10*3/uL (ref 150–400)
RBC: 4.02 MIL/uL (ref 3.87–5.11)
RDW: 13.6 % (ref 11.5–15.5)
WBC: 6.1 10*3/uL (ref 4.0–10.5)

## 2015-03-11 NOTE — Evaluation (Signed)
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 Patient independent to mod I PTA. Patient currently functioning at an overall supervision/min guard assist level for functional transfers & mobility and overall mod assist for LB ADLs. D/C from acute OT services and no additional follow-up OT needs at this time. All appropriate education provided to patient. Please re-order OT if needed.      Follow Up Recommendations  No OT follow up;Supervision - Intermittent    Equipment Recommendations  None recommended by OT    Recommendations for Other Services  None at this time   Precautions / Restrictions Precautions Precautions: Knee;Fall Precaution Comments: reviewed no pillow under knee  Restrictions Weight Bearing Restrictions: Yes Other Position/Activity Restrictions: WBAT    Mobility Bed Mobility Overal bed mobility: Needs Assistance Bed Mobility: Supine to Sit;Sit to Supine     Supine to sit: Supervision Sit to supine: Supervision   General bed mobility comments: Supervision for safety, pt able to manage BLEs to sit EOB and to go from sit to supine  Transfers Overall transfer level: Needs assistance Equipment used: Rolling walker (2 wheeled) Transfers: Sit to/from Stand Sit to Stand: Min guard;Supervision General transfer comment: Pt supervision to min guard for functional transfers. Cues for hand placement and overall safety.     Balance Overall balance assessment: Needs assistance Sitting-balance support: No upper extremity supported;Feet supported Sitting balance-Leahy Scale: Good Standing balance support: Bilateral upper extremity supported;During functional activity Standing balance-Leahy Scale: Fair    ADL Overall ADL's : Needs assistance/impaired Eating/Feeding: Set  up;Sitting   Grooming: Supervision/safety;Standing   Upper Body Bathing: Set up;Sitting   Lower Body Bathing: Moderate assistance;Sit to/from stand   Upper Body Dressing : Set up;Sitting   Lower Body Dressing: Moderate assistance;Sit to/from stand   Toilet Transfer: Supervision/safety;RW;BSC;Grab bars;Ambulation   Toileting- Clothing Manipulation and Hygiene: Supervision/safety;Sit to/from stand     Tub/Shower Transfer Details (indicate cue type and reason): did not occur, educated pt on safe and effective tub/shower transfers using 3-in-1 (hand out adminsterred to patient) Functional mobility during ADLs: Supervision/safety;Rolling walker General ADL Comments: Pt reports having left knee done April 2016. Pt states she has a 3-in-1 she uses over her toilet seat. Pt states she plans to sponge bath initially, then once she feels comfortable she plans to practice "dry run" tub/shower transfer with her daughter present. Pt states she may or may not use 3-in-1 in shower, but this therapist gave pt educational handout on how to position 3-in-1 in shower and verbally educated pt on this. Also educated pt on use of AE to increase independence with LB ADLs. Pt reports that she does not want AE. Encouraged pt to work on reaching feet to increase independence with LB ADLs.     Pertinent Vitals/Pain Pain Assessment: 0-10 Pain Score: 8  Pain Location: right knee Pain Descriptors / Indicators: Sore;Aching;Guarding Pain Intervention(s): Limited activity within patient's tolerance;Monitored during session;Ice applied     Hand Dominance Right   Extremity/Trunk Assessment Upper Extremity Assessment Upper Extremity Assessment: Overall WFL for tasks assessed   Lower Extremity Assessment Lower Extremity Assessment: Defer to PT evaluation   Cervical / Trunk Assessment Cervical / Trunk Assessment: Normal   Communication Communication Communication: No difficulties   Cognition Arousal/Alertness:  Awake/alert Behavior During Therapy: WFL for tasks assessed/performed Overall Cognitive Status: Within Functional  Limits for tasks assessed              Home Living Family/patient expects to be discharged to:: Private residence Living Arrangements: Children Available Help at Discharge: Family;Available 24 hours/day Type of Home: Apartment Home Access: Stairs to enter Entrance Stairs-Number of Steps: 6 Entrance Stairs-Rails: Right;Left Home Layout: One level     Bathroom Shower/Tub: Tub/shower unit;Curtain   Biochemist, clinical: Standard     Home Equipment: Cane - single point;Bedside commode;Walker - 2 wheels   Prior Functioning/Environment Level of Independence: Independent  Comments: occcasionally used cane; on disability since breast CA.    OT Diagnosis: Generalized weakness;Acute pain   OT Problem List:  n/a, no further acute OT needs identified    OT Treatment/Interventions:   n/a, no further acute OT needs identified    OT Goals(Current goals can be found in the care plan section) Acute Rehab OT Goals Patient Stated Goal: go home tomorrow OT Goal Formulation: All assessment and education complete, DC therapy  OT Frequency:   n/a, no further acute OT needs identified    Barriers to D/C:  None known at this time    End of Session Equipment Utilized During Treatment: Rolling walker  Activity Tolerance: Patient tolerated treatment well Patient left: in bed;with call bell/phone within reach   Time: 1412-1437 OT Time Calculation (min): 25 min Charges:  OT General Charges $OT Visit: 1 Procedure OT Evaluation $Initial OT Evaluation Tier I: 1 Procedure OT Treatments $Self Care/Home Management : 8-22 mins  Mitsuo Budnick , MS, OTR/L, CLT Pager: [phone]  03/11/2015, 3:01 PM

## 2015-03-11 NOTE — Evaluation (Signed)
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  Pt should progress to dc home with family assist and HHPT follow up.    Follow Up Recommendations Home health PT    Equipment Recommendations  None recommended by PT    Recommendations for Other Services OT consult     Precautions / Restrictions Precautions Precautions: Knee;Fall Restrictions Weight Bearing Restrictions: No Other Position/Activity Restrictions: WBAT      Mobility  Bed Mobility Overal bed mobility: Needs Assistance Bed Mobility: Supine to Sit     Supine to sit: Min assist     General bed mobility comments: cues for sequence and use of L LE to self assist  Transfers Overall transfer level: Needs assistance Equipment used: Rolling walker (2 wheeled) Transfers: Sit to/from Stand Sit to Stand: Min assist;Mod assist         General transfer comment: cues for LE management and use of UEs to self assist  Ambulation/Gait Ambulation/Gait assistance: Min assist Ambulation Distance (Feet): 38 Feet Assistive device: Rolling walker (2 wheeled) Gait Pattern/deviations: Step-to pattern;Step-through pattern;Decreased step length - right;Decreased step length - left;Shuffle;Trunk flexed Gait velocity: decr   General Gait Details: cues for sequence, posture and position from [employer]            Wheelchair Mobility    Modified Rankin (Stroke Patients Only)       Balance                                             Pertinent Vitals/Pain Pain Assessment: 0-10 Pain Score: 7  Pain Location: R knee Pain Descriptors / Indicators: Aching;Sore Pain Intervention(s): Limited activity within patient's tolerance;Monitored during  session;Premedicated before session;Ice applied    Home Living Family/patient expects to be discharged to:: Private residence Living Arrangements: Children Available Help at Discharge: Family;Available 24 hours/day Type of Home: Apartment Home Access: Stairs to enter Entrance Stairs-Rails: Right;Left Entrance Stairs-Number of Steps: 6 Home Layout: One level Home Equipment: Cane - single point;Bedside commode;Walker - 2 wheels      Prior Function Level of Independence: Independent         Comments: occcasionally used cane; on disability since breast CA.     Hand Dominance        Extremity/Trunk Assessment   Upper Extremity Assessment: Overall WFL for tasks assessed           Lower Extremity Assessment: RLE deficits/detail RLE Deficits / Details: 2+/5 quads with AAROM at knee -10 - 40    Cervical / Trunk Assessment: Normal  Communication   Communication: No difficulties  Cognition Arousal/Alertness: Awake/alert Behavior During Therapy: WFL for tasks assessed/performed Overall Cognitive Status: Within Functional Limits for tasks assessed                      General Comments      Exercises Total Joint Exercises Ankle Circles/Pumps: AROM;Both;15 reps;Supine Quad Sets: AROM;Both;10 reps;Supine Heel Slides: AAROM;Right;10 reps;Supine Straight Leg Raises: AAROM;Right;5 reps;Supine      Assessment/Plan    PT Assessment Patient needs continued PT services  PT Diagnosis Difficulty walking   PT Problem  List Decreased strength;Decreased range of motion;Decreased activity tolerance;Decreased mobility;Decreased knowledge of use of DME;Pain;Obesity  PT Treatment Interventions DME instruction;Gait training;Functional mobility training;Stair training;Therapeutic activities;Therapeutic exercise;Patient/family education   PT Goals (Current goals can be found in the Care Plan section) Acute Rehab PT Goals Patient Stated Goal: HOME PT Goal Formulation: With  patient Time For Goal Achievement: 03/16/15 Potential to Achieve Goals: Good    Frequency 7X/week   Barriers to discharge        Co-evaluation               End of Session Equipment Utilized During Treatment: Gait belt Activity Tolerance: Patient tolerated treatment well;Patient limited by pain Patient left: in chair;with call bell/phone within reach Nurse Communication: Mobility status         Time: 1030-1101 PT Time Calculation (min) (ACUTE ONLY): 31 min   Charges:         PT G Codes:        Leory Allinson 03/20/2015, 12:27 PM

## 2015-03-11 NOTE — Plan of Care (Signed)
Problem: Consults Goal: Diagnosis- Total Joint Replacement Outcome: Completed/Met Date Met:  03/11/15 Primary Total Knee RIGHT

## 2015-03-11 NOTE — Progress Notes (Signed)
   Subjective:  Patient reports pain as mild to moderate.  Pain control issues o/n - ok now. Denies N/V/CP/SOB.  Objective:   VITALS:   Filed Vitals:   03/10/15 1929 03/10/15 2050 03/11/15 0120 03/11/15 0539  BP: 118/69 112/65 100/69 98/74  Pulse: 74 56 56 55  Temp: 97.3 F (36.3 C) 97.6 F (36.4 C) 97.5 F (36.4 C) 97.5 F (36.4 C)  TempSrc: Axillary Oral Oral Oral  Resp: 16 16 16 16   Height:      Weight:      SpO2: 100% 100% 100% 100%    Sensation intact distally Intact pulses distally Dorsiflexion/Plantar flexion intact Incision: dressing C/D/I Compartment soft   Lab Results  Component Value Date   WBC 6.1 03/11/2015   HGB 11.0* 03/11/2015   HCT 34.2* 03/11/2015   MCV 85.1 03/11/2015   PLT 159 03/11/2015   BMET    Component Value Date/Time   NA 138 03/11/2015 0445   NA 140 12/15/2014 1028   K 3.8 03/11/2015 0445   K 3.3* 12/15/2014 1028   CL 103 03/11/2015 0445   CL 101 09/18/2012 0853   CO2 28 03/11/2015 0445   CO2 32* 12/15/2014 1028   GLUCOSE 138* 03/11/2015 0445   GLUCOSE 88 12/15/2014 1028   GLUCOSE 94 09/18/2012 0853   BUN 15 03/11/2015 0445   BUN 22.5 12/15/2014 1028   CREATININE 0.72 03/11/2015 0445   CREATININE 0.9 12/15/2014 1028   CALCIUM 9.0 03/11/2015 0445   CALCIUM 9.5 12/15/2014 1028   GFRNONAA >60 03/11/2015 0445   GFRAA >60 03/11/2015 0445     Assessment/Plan: 1 Day Post-Op   Active Problems:   Primary osteoarthritis of right knee  WBAT with walker DVT ppx: apixiban, foot pumps, TEDS PT/OT PO pain control D/C HV drain Plan for discharge tomorrow    Elie Goody 03/11/2015, 10:05 AM   Rod Can, MD Cell [phone]

## 2015-03-11 NOTE — Progress Notes (Signed)
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  Follow Up Recommendations  Home health PT     Equipment Recommendations  None recommended by PT    Recommendations for Other Services OT consult     Precautions / Restrictions Precautions Precautions: Knee;Fall Precaution Comments: reviewed no pillow under knee  Restrictions Weight Bearing Restrictions: No Other Position/Activity Restrictions: WBAT    Mobility  Bed Mobility Overal bed mobility: Needs Assistance Bed Mobility: Supine to Sit;Sit to Supine     Supine to sit: Supervision Sit to supine: Supervision   General bed mobility comments: Supervision for safety, pt able to manage BLEs to sit EOB and to go from sit to supine  Transfers Overall transfer level: Needs assistance Equipment used: Rolling walker (2 wheeled) Transfers: Sit to/from Stand Sit to Stand: Min guard;Supervision         General transfer comment: Pt supervision to min guard for functional transfers. Cues for hand placement and overall safety.   Ambulation/Gait                 Stairs            Wheelchair Mobility    Modified Rankin (Stroke Patients Only)       Balance Overall balance assessment: Needs assistance Sitting-balance support: No upper extremity supported;Feet supported Sitting balance-Leahy Scale: Good     Standing balance support: Bilateral upper extremity supported;During functional activity Standing balance-Leahy Scale: Fair                      Cognition Arousal/Alertness: Awake/alert Behavior During Therapy: WFL for tasks assessed/performed Overall Cognitive Status: Within Functional Limits for tasks assessed                       Exercises Total Joint Exercises Ankle Circles/Pumps: AROM;Both;15 reps;Supine Quad Sets: AROM;Both;10 reps;Supine Heel Slides: AAROM;Right;10 reps;Supine Straight Leg Raises: AAROM;Right;5 reps;Supine    General Comments        Pertinent Vitals/Pain Pain Assessment: 0-10 Pain Score: 8  Pain Location: R knee Pain Descriptors / Indicators: Aching;Sore Pain Intervention(s): Limited activity within patient's tolerance;Monitored during session;Premedicated before session;Ice applied    Home Living Family/patient expects to be discharged to:: Private residence Living Arrangements: Children Available Help at Discharge: Family;Available 24 hours/day Type of Home: Apartment Home Access: Stairs to enter Entrance Stairs-Rails: Right;Left Home Layout: One level Home Equipment: Cane - single point;Bedside commode;Walker - 2 wheels      Prior Function Level of Independence: Independent      Comments: occcasionally used cane; on disability since breast CA.   PT Goals (current goals can now be found in the care plan section) Acute Rehab PT Goals Patient Stated Goal: go home tomorrow PT Goal Formulation: With patient Time For Goal Achievement: 03/16/15 Potential to Achieve Goals: Good Progress towards PT goals: Progressing toward goals    Frequency  7X/week    PT Plan Current plan remains appropriate    Co-evaluation             End of Session   Activity Tolerance: Patient limited by pain Patient left: in bed;with call bell/phone within reach     Time: 8786-7672 PT  Time Calculation (min) (ACUTE ONLY): 14 min  Charges:  $Therapeutic Exercise: 8-22 mins                    G Codes:      Makel Mcmann 04-01-2015, 5:04 PM

## 2015-03-11 NOTE — Discharge Instructions (Signed)
°Dr. Brian Swinteck °Total Joint Specialist °[facility] °[address] °[place], [place] [address] °[phone] ° °TOTAL KNEE REPLACEMENT POSTOPERATIVE DIRECTIONS ° ° ° °Knee Rehabilitation, Guidelines Following Surgery  °Results after knee surgery are often greatly improved when you follow the exercise, range of motion and muscle strengthening exercises prescribed by your doctor. Safety measures are also important to protect the knee from further injury. Any time any of these exercises cause you to have increased pain or swelling in your knee joint, decrease the amount until you are comfortable again and slowly increase them. If you have problems or questions, call your caregiver or physical therapist for advice.  ° °WEIGHT BEARING °Weight bearing as tolerated with assist device (walker, cane, etc) as directed, use it as long as suggested by your surgeon or therapist, typically at least 4-6 weeks. ° °HOME CARE INSTRUCTIONS  °Remove items at home which could result in a fall. This includes throw rugs or furniture in walking pathways.  °Continue medications as instructed at time of discharge. °You may have some home medications which will be placed on hold until you complete the course of blood thinner medication.  °You may start showering once you are discharged home but do not submerge the incision under water. Just pat the incision dry and apply a dry gauze dressing on daily. °Walk with walker as instructed.  °You may resume a sexual relationship in one month or when given the OK by your doctor.  °· Use walker as long as suggested by your caregivers. °· Avoid periods of inactivity such as sitting longer than an hour when not asleep. This helps prevent blood clots.  °You may put full weight on your legs and walk as much as is comfortable.  °You may return to work once you are cleared by your doctor.  °Do not drive a car for 6 weeks or until released by you surgeon.  °· Do not drive while  taking narcotics.  °Wear the elastic stockings for three weeks following surgery during the day but you may remove then at night. °Make sure you keep all of your appointments after your operation with all of your doctors and caregivers. You should call the office at the above phone number and make an appointment for approximately two weeks after the date of your surgery. °Do not remove your surgical dressing. The dressing is waterproof; you may take showers in 3 days, but do not take tub baths or submerge the dressing. °Please pick up a stool softener and laxative for home use as long as you are requiring pain medications. °· ICE to the affected knee every three hours for 30 minutes at a time and then as needed for pain and swelling.  Continue to use ice on the knee for pain and swelling from surgery. You may notice swelling that will progress down to the foot and ankle.  This is normal after surgery.  Elevate the leg when you are not up walking on it.   °It is important for you to complete the blood thinner medication as prescribed by your doctor. °· Continue to use the breathing machine which will help keep your temperature down.  It is common for your temperature to cycle up and down following surgery, especially at night when you are not up moving around and exerting yourself.  The breathing machine keeps your lungs expanded and your temperature down. ° °RANGE OF MOTION AND STRENGTHENING EXERCISES  °Rehabilitation of the knee is important following a   knee injury or an operation. After just a few days of immobilization, the muscles of the thigh which control the knee become weakened and shrink (atrophy). Knee exercises are designed to build up the tone and strength of the thigh muscles and to improve knee motion. Often times heat used for twenty to thirty minutes before working out will loosen up your tissues and help with improving the range of motion but do not use heat for the first two weeks following  surgery. These exercises can be done on a training (exercise) mat, on the floor, on a table or on a bed. Use what ever works the best and is most comfortable for you Knee exercises include:  Leg Lifts - While your knee is still immobilized in a splint or cast, you can do straight leg raises. Lift the leg to 60 degrees, hold for 3 sec, and slowly lower the leg. Repeat 10-20 times 2-3 times daily. Perform this exercise against resistance later as your knee gets better.  Quad and Hamstring Sets - Tighten up the muscle on the front of the thigh (Quad) and hold for 5-10 sec. Repeat this 10-20 times hourly. Hamstring sets are done by pushing the foot backward against an object and holding for 5-10 sec. Repeat as with quad sets.  A rehabilitation program following serious knee injuries can speed recovery and prevent re-injury in the future due to weakened muscles. Contact your doctor or a physical therapist for more information on knee rehabilitation.   SKILLED REHAB INSTRUCTIONS: If the patient is transferred to a skilled rehab facility following release from the hospital, a list of the current medications will be sent to the facility for the patient to continue.  When discharged from the skilled rehab facility, please have the facility set up the patient's [place] prior to being released. Also, the skilled facility will be responsible for providing the patient with their medications at time of release from the facility to include their pain medication, the muscle relaxants, and their blood thinner medication. If the patient is still at the rehab facility at time of the two week follow up appointment, the skilled rehab facility will also need to assist the patient in arranging follow up appointment in our office and any transportation needs.  MAKE SURE YOU:  Understand these instructions.  Will watch your condition.  Will get help right away if you are not doing well or get worse.     Pick up stool softner and laxative for home use following surgery while on pain medications. Do NOT remove your dressing. You may shower.  Do not take tub baths or submerge incision under water. May shower starting three days after surgery. Please use a clean towel to pat the incision dry following showers. Continue to use ice for pain and swelling after surgery. Do not use any lotions or creams on the incision until instructed by your surgeon.   Information on my medicine - ELIQUIS (apixaban)  This medication education was reviewed with me or my healthcare representative as part of my discharge preparation.  The pharmacist that spoke with me during my hospital stay was:  Clovis Riley, [facility]  Why was Eliquis prescribed for you? Eliquis was prescribed for you to reduce the risk of blood clots forming after orthopedic surgery.    What do You need to know about Eliquis? Take your Eliquis TWICE DAILY - one tablet in the morning and one tablet in the evening with or without food.  It would be best to take the dose about the same time each day.  If you have difficulty swallowing the tablet whole please discuss with your pharmacist how to take the medication safely.  Take Eliquis exactly as prescribed by your doctor and DO NOT stop taking Eliquis without talking to the doctor who prescribed the medication.  Stopping without other medication to take the place of Eliquis may increase your risk of developing a clot.  After discharge, you should have regular check-up appointments with your healthcare provider that is prescribing your Eliquis.  What do you do if you miss a dose? If a dose of ELIQUIS is not taken at the scheduled time, take it as soon as possible on the same day and twice-daily administration should be resumed.  The dose should not be doubled to make up for a missed dose.  Do not take more than one tablet of ELIQUIS at the same time.  Important Safety  Information A possible side effect of Eliquis is bleeding. You should call your healthcare provider right away if you experience any of the following: ? Bleeding from an injury or your nose that does not stop. ? Unusual colored urine (red or dark brown) or unusual colored stools (red or black). ? Unusual bruising for unknown reasons. ? A serious fall or if you hit your head (even if there is no bleeding).  Some medicines may interact with Eliquis and might increase your risk of bleeding or clotting while on Eliquis. To help avoid this, consult your healthcare provider or pharmacist prior to using any new prescription or non-prescription medications, including herbals, vitamins, non-steroidal anti-inflammatory drugs (NSAIDs) and supplements.  This website has more information on Eliquis (apixaban): http://www.eliquis.com/eliquis/home

## 2015-03-12 LAB — CBC
HCT: 34.6 % — ABNORMAL LOW (ref 36.0–46.0)
Hemoglobin: 11.3 g/dL — ABNORMAL LOW (ref 12.0–15.0)
MCH: 28.1 pg (ref 26.0–34.0)
MCHC: 32.7 g/dL (ref 30.0–36.0)
MCV: 86.1 fL (ref 78.0–100.0)
Platelets: 191 10*3/uL (ref 150–400)
RBC: 4.02 MIL/uL (ref 3.87–5.11)
RDW: 14.2 % (ref 11.5–15.5)
WBC: 8.9 10*3/uL (ref 4.0–10.5)

## 2015-03-12 MED ORDER — ONDANSETRON HCL 4 MG PO TABS: 4.0000 mg | ORAL_TABLET | Freq: Four times a day (QID) | ORAL | Status: DC | PRN

## 2015-03-12 MED ORDER — APIXABAN 2.5 MG PO TABS: 2.5000 mg | ORAL_TABLET | Freq: Two times a day (BID) | ORAL | Status: DC

## 2015-03-12 MED ORDER — OXYCODONE HCL ER 10 MG PO T12A: 10.0000 mg | EXTENDED_RELEASE_TABLET | ORAL | Status: DC

## 2015-03-12 MED ORDER — METHOCARBAMOL 500 MG PO TABS: 500.0000 mg | ORAL_TABLET | Freq: Four times a day (QID) | ORAL | Status: DC | PRN

## 2015-03-12 MED ORDER — HYDROCODONE-ACETAMINOPHEN 5-325 MG PO TABS: 1.0000 | ORAL_TABLET | ORAL | Status: DC | PRN

## 2015-03-12 NOTE — Care Management Note (Signed)
Case Management Note  Patient Details  Name: Kelsey Wilson MRN: [id number] Date of Birth: 05/13/1961  Subjective/Objective:  Right total knee arthroplasty                  Action/Plan: Home Health. NCM spoke to pt and offered choice for [facility] Pt requested [facility] for [facility]. Pt states she has RW and 3n1 at home from a previous surgery. Pt states dtr at home to assist with her care. Contacted [facility] for scheduled dc home today.   Expected Discharge Date:  03/12/2015               Expected Discharge Plan:  [place]  In-House Referral:     Discharge planning Services  CM Consult  Post Acute Care Choice:  Home Health Choice offered to:  Patient   HH Arranged:  PT HH Agency:  [facility]  Status of Service:  Completed, signed off  Medicare Important Message Given:    Date Medicare IM Given:    Medicare IM give by:    Date Additional Medicare IM Given:    Additional Medicare Important Message give by:     If discussed at [place] of Stay Meetings, dates discussed:    Additional Comments:  Erenest Rasher, RN 03/12/2015, 10:07 AM

## 2015-03-12 NOTE — Progress Notes (Signed)
   Subjective:  Patient reports pain as mild to moderate. Has pain with PT. Denies N/V/CP/SOB.  Objective:   VITALS:   Filed Vitals:   03/11/15 1356 03/11/15 1756 03/11/15 2139 03/12/15 0532  BP: 105/52 119/64 104/60 111/69  Pulse: 58 64 67 62  Temp: 97.8 F (36.6 C) 98 F (36.7 C) 97.5 F (36.4 C) 97.8 F (36.6 C)  TempSrc: Oral Oral Oral Oral  Resp: 15 16 16 16   Height:      Weight:      SpO2: 97% 97% 100% 99%    Sensation intact distally Intact pulses distally Dorsiflexion/Plantar flexion intact Incision: dressing C/D/I Compartment soft   Lab Results  Component Value Date   WBC 8.9 03/12/2015   HGB 11.3* 03/12/2015   HCT 34.6* 03/12/2015   MCV 86.1 03/12/2015   PLT 191 03/12/2015   BMET    Component Value Date/Time   NA 138 03/11/2015 0445   NA 140 12/15/2014 1028   K 3.8 03/11/2015 0445   K 3.3* 12/15/2014 1028   CL 103 03/11/2015 0445   CL 101 09/18/2012 0853   CO2 28 03/11/2015 0445   CO2 32* 12/15/2014 1028   GLUCOSE 138* 03/11/2015 0445   GLUCOSE 88 12/15/2014 1028   GLUCOSE 94 09/18/2012 0853   BUN 15 03/11/2015 0445   BUN 22.5 12/15/2014 1028   CREATININE 0.72 03/11/2015 0445   CREATININE 0.9 12/15/2014 1028   CALCIUM 9.0 03/11/2015 0445   CALCIUM 9.5 12/15/2014 1028   GFRNONAA >60 03/11/2015 0445   GFRAA >60 03/11/2015 0445     Assessment/Plan: 2 Days Post-Op   Principal Problem:   Primary osteoarthritis of right knee  WBAT with walker DVT ppx: apixiban, foot pumps, TEDS PT/OT PO pain control Discharge home with home health    Janeliz Prestwood, Horald Pollen 03/12/2015, 8:26 AM   Rod Can, MD Cell [phone]

## 2015-03-12 NOTE — Progress Notes (Signed)
Physical Therapy Treatment Patient Details Name: Kelsey Wilson MRN: [id number] DOB: 07-25-1960 Today's Date: 03/12/2015    History of Present Illness Pt s/p R TKR (L TKR 4/16)    PT Comments    Pt progressing well with mobility.  Reviewed therex and stairs.  Pt eager for return home  Follow Up Recommendations  Home health PT     Equipment Recommendations  None recommended by PT    Recommendations for Other Services OT consult     Precautions / Restrictions Precautions Precautions: Knee;Fall Restrictions Weight Bearing Restrictions: No Other Position/Activity Restrictions: WBAT    Mobility  Bed Mobility Overal bed mobility: Needs Assistance Bed Mobility: Supine to Sit;Sit to Supine     Supine to sit: Supervision Sit to supine: Supervision   General bed mobility comments: Supervision for safety, pt able to manage BLEs to sit EOB and to go from sit to supine  Transfers Overall transfer level: Needs assistance Equipment used: Rolling walker (2 wheeled) Transfers: Sit to/from Stand Sit to Stand: Supervision         General transfer comment: min cues for saftey and LE management  Ambulation/Gait Ambulation/Gait assistance: Min guard;Supervision Ambulation Distance (Feet): 100 Feet (and 75') Assistive device: Rolling walker (2 wheeled) Gait Pattern/deviations: Step-to pattern;Step-through pattern;Shuffle;Trunk flexed Gait velocity: decr   General Gait Details: cues for sequence, posture and position from RW   Stairs Stairs: Yes Stairs assistance: Min assist Stair Management: One rail Right;Step to pattern;Forwards;With cane Number of Stairs: 4 General stair comments: cues for sequence and foot/cane placement  Wheelchair Mobility    Modified Rankin (Stroke Patients Only)       Balance     Sitting balance-Leahy Scale: Good       Standing balance-Leahy Scale: Fair                      Cognition Arousal/Alertness:  Awake/alert Behavior During Therapy: WFL for tasks assessed/performed Overall Cognitive Status: Within Functional Limits for tasks assessed                      Exercises Total Joint Exercises Ankle Circles/Pumps: AROM;Both;15 reps;Supine Quad Sets: AROM;Both;Supine;15 reps Heel Slides: AAROM;Right;Supine;15 reps Straight Leg Raises: AAROM;Right;Supine;10 reps    General Comments        Pertinent Vitals/Pain Pain Assessment: 0-10 Pain Score: 8  Pain Location: R knee Pain Descriptors / Indicators: Aching;Sore Pain Intervention(s): Limited activity within patient's tolerance;Monitored during session;Premedicated before session;Ice applied    Home Living                      Prior Function            PT Goals (current goals can now be found in the care plan section) Acute Rehab PT Goals PT Goal Formulation: With patient Time For Goal Achievement: 03/16/15 Potential to Achieve Goals: Good Progress towards PT goals: Progressing toward goals    Frequency  7X/week    PT Plan Current plan remains appropriate    Co-evaluation             End of Session Equipment Utilized During Treatment: Gait belt Activity Tolerance: Patient limited by pain;Patient tolerated treatment well Patient left: Other (comment) (bathroom)     Time: 4650-3546 PT Time Calculation (min) (ACUTE ONLY): 29 min  Charges:  $Gait Training: 8-22 mins $Therapeutic Exercise: 8-22 mins  G Codes:      Kelsey Wilson 2015-04-04, 11:55 AM

## 2015-03-15 ENCOUNTER — Other Ambulatory Visit: Payer: Self-pay | Admitting: Oncology

## 2015-03-15 DIAGNOSIS — Z853 Personal history of malignant neoplasm of breast: Secondary | ICD-10-CM

## 2015-03-28 ENCOUNTER — Ambulatory Visit: Payer: Medicare Other | Admitting: Dietician

## 2015-03-29 ENCOUNTER — Ambulatory Visit: Payer: Medicare Other | Admitting: Podiatry

## 2015-04-07 ENCOUNTER — Other Ambulatory Visit: Payer: Self-pay | Admitting: Registered Nurse

## 2015-04-07 ENCOUNTER — Other Ambulatory Visit (HOSPITAL_COMMUNITY): Payer: Self-pay | Admitting: Orthopedic Surgery

## 2015-04-07 ENCOUNTER — Ambulatory Visit (HOSPITAL_COMMUNITY)
Admission: RE | Admit: 2015-04-07 | Discharge: 2015-04-07 | Disposition: A | Discharge status: Home / Self Care | Payer: Medicare Other | Source: Ambulatory Visit | Attending: Cardiovascular Disease | Admitting: Cardiovascular Disease

## 2015-04-07 DIAGNOSIS — M7989 Other specified soft tissue disorders: Secondary | ICD-10-CM | POA: Diagnosis present

## 2015-04-07 NOTE — Telephone Encounter (Signed)
rec'd electronic request for Tramadol from pt's pharmacy. Rod Can, MD, surgeon discontinued this medication upon hospital discharge following knee surgery.  Pt was given script for Norco 5-325 mg #90 on 03/12/2015 (no telephone call reporting). Pt must be out of her hydrocodone since a full month has passed and is asking for tramadol to be reinstated....please advise

## 2015-04-26 ENCOUNTER — Encounter: Payer: Medicare Other | Attending: General Surgery | Admitting: Dietician

## 2015-04-26 ENCOUNTER — Encounter: Payer: Self-pay | Admitting: Dietician

## 2015-04-26 DIAGNOSIS — Z6841 Body Mass Index (BMI) 40.0 and over, adult: Secondary | ICD-10-CM | POA: Insufficient documentation

## 2015-04-26 DIAGNOSIS — E669 Obesity, unspecified: Secondary | ICD-10-CM | POA: Diagnosis not present

## 2015-04-26 DIAGNOSIS — Z713 Dietary counseling and surveillance: Secondary | ICD-10-CM | POA: Diagnosis not present

## 2015-04-26 NOTE — Progress Notes (Signed)
  Medical Nutrition Therapy:  Appt start time: 11:20 end time:  12:10.   Assessment:  Primary concerns today: Kelsey Wilson is here today to discuss weight loss. She states that she wants to lose 100 lbs, to help with chronic pain. She doesn't do a lot of cooking, states that "majority" of her meals are eating out. She stated that her primary care provider recommended to consume ensure. Her nine grandkids live with her and daughter lives with her. She and her daughter share the grocery shopping. A lot of pressure and stress cause more pain making it hard to eat, sometimes it causes nausea. She reported that cancer altered her taste preferences. She has an aide that comes over and assists her.   She reported losing weight without trying    Preferred Learning Style:   No preference indicated   Learning Readiness:   Ready    MEDICATIONS: See list    DIETARY INTAKE:  Avoided foods include milk, raw carrots.    24-hr recall:  B ( AM): left over spagehetti Snk ( AM): none L ( PM): Chick fila chicken sandwich, fries with a coke, typical rotisserie chicken etc chicken; ham and cheese sandwich  Snk ( PM): none D ( PM): [employer] two piece breast and wing, dirty rice, biscuit, two cinnamon biscuits, water  Snk ( PM): bag of chips, fruit, cookie, chew a piece of gum Beverages: coke(2 cups), water, coffee sugar and cream  Usual physical activity. Phyical therapy twice a week, stretches, [facility]  Estimated energy needs: 1600-1800 calories  Progress Towards Goal(s):  In progress.   Nutritional Diagnosis:  Sumpter-3.3 Overweight/obesity As related to frequent meals outside of the home and consumption of soda .  As evidenced by twenty-four hour recall and BMI in obese range .    Intervention:  Nutrition counseling provided. Educated patient on my plate method and consuming snacks that have both a carbohydrate and a protein.   Goals:  Try protein  drinks that are lower in sugar and higher in protein (such as premier protein shakes, Atkins protein shake)  Try to get your aide/family to cook extra for leftovers the next day  Try to have snacks that have both a protein and carbohydrate  [facility]   Teaching Method Utilized: Visual Auditory   Handouts given during visit include:  Myplate   Snack sheet   Meal planning card   Bariatric Protein shakes   Barriers to learning/adherence to lifestyle change: Patient reports caring for many grandchildren. Limited mobility   Demonstrated degree of understanding via:  Teach Back   Monitoring/Evaluation:  Dietary intake, exercise, and body weight in 3 month(s).

## 2015-04-26 NOTE — Patient Instructions (Addendum)
Try protein drinks that are lower in sugar and higher in protein (such as premier protein shakes, Atkins protein shake)  Try to get your aide/family to cook extra for leftovers the next day  Try to have snacks that have both a protein and carbohydrate  Stick with your plan to exercise at the [facility]

## 2015-05-19 ENCOUNTER — Telehealth: Payer: Self-pay

## 2015-05-19 NOTE — Telephone Encounter (Signed)
Left detailed message on home number per signed DPR and information given from Dr Letta Pate.  She is to call back if she has questions.

## 2015-05-19 NOTE — Telephone Encounter (Signed)
tell the patient to go to [facility] for diabetic shoes. She doesn't have diabetes but does have neuropathy. They can advise her whether her insurance would pay for this. If it does I can write a prescription.

## 2015-05-19 NOTE — Telephone Encounter (Signed)
Patient called in and states that she needs a new Rx or referral to get new sneakers. Please advise. Thanks!

## 2015-05-20 ENCOUNTER — Telehealth: Payer: Self-pay | Admitting: Oncology

## 2015-05-20 NOTE — Telephone Encounter (Signed)
Lvm advising appt chg on 1/11 from 9.30am with MD to 12.45 with Heather due to [facility].

## 2015-05-31 ENCOUNTER — Ambulatory Visit
Admission: RE | Admit: 2015-05-31 | Discharge: 2015-05-31 | Disposition: A | Discharge status: Home / Self Care | Payer: Medicare Other | Source: Ambulatory Visit | Attending: Oncology | Admitting: Oncology

## 2015-05-31 DIAGNOSIS — Z853 Personal history of malignant neoplasm of breast: Secondary | ICD-10-CM

## 2015-06-10 ENCOUNTER — Encounter: Payer: Self-pay | Admitting: Gastroenterology

## 2015-06-10 ENCOUNTER — Ambulatory Visit (INDEPENDENT_AMBULATORY_CARE_PROVIDER_SITE_OTHER): Payer: Medicare Other | Admitting: Podiatry

## 2015-06-10 DIAGNOSIS — B351 Tinea unguium: Secondary | ICD-10-CM

## 2015-06-10 DIAGNOSIS — M79676 Pain in unspecified toe(s): Secondary | ICD-10-CM

## 2015-06-10 NOTE — Progress Notes (Signed)
Subjective:     Patient ID: Kelsey Wilson, female   DOB: Aug 07, 1960, 55 y.o.   MRN: [id number]  HPIThis patient returns for nail care both feet.  She admits to having right knee treatment prior to visit.  She states she has pain through bottom of both feet which were treated plantar fascial brace which helped some.  Peripheral neuropathy.   Review of Systems     Objective:   Physical Exam GENERAL APPEARANCE: Alert, conversant. Appropriately groomed. No acute distress.  VASCULAR: Pedal pulses palpable at 2/4 DP and PT bilateral.  Capillary refill time is immediate to all digits,  Proximal to distal cooling it warm to warm.  Digital hair growth is present bilateral  NEUROLOGIC: sensation is intact epicritically and protectively to 5.07 monofilament at 5/5 sites bilateral.  Light touch is intact bilateral, vibratory sensation intact bilateral, achilles tendon reflex is intact bilateral.  MUSCULOSKELETAL: acceptable muscle strength, tone and stability bilateral.  Intrinsic muscluature intact bilateral.  Rectus appearance of foot and digits noted bilateral.   Plantar fascia painful  upon palpation B/L  DERMATOLOGIC: skin color, texture, and turgor are within normal limits.  No preulcerative lesions or ulcers  are seen, no interdigital maceration noted.  No open lesions present.  . No drainage noted. NAILS  Thick disfigured discolored nails both feet x 10      Assessment:     Onychomycosis    Plan:  Debridement of onychomycotic nails.  RTC  4 months.   Gardiner Barefoot DPM

## 2015-06-14 ENCOUNTER — Other Ambulatory Visit: Payer: Self-pay | Admitting: *Deleted

## 2015-06-14 DIAGNOSIS — C50412 Malignant neoplasm of upper-outer quadrant of left female breast: Secondary | ICD-10-CM

## 2015-06-15 ENCOUNTER — Other Ambulatory Visit (HOSPITAL_BASED_OUTPATIENT_CLINIC_OR_DEPARTMENT_OTHER): Payer: Medicare Other

## 2015-06-15 ENCOUNTER — Other Ambulatory Visit: Payer: Self-pay | Admitting: *Deleted

## 2015-06-15 ENCOUNTER — Ambulatory Visit (HOSPITAL_BASED_OUTPATIENT_CLINIC_OR_DEPARTMENT_OTHER): Payer: Medicare Other | Admitting: Nurse Practitioner

## 2015-06-15 ENCOUNTER — Encounter: Payer: Self-pay | Admitting: Nurse Practitioner

## 2015-06-15 ENCOUNTER — Telehealth: Payer: Self-pay | Admitting: Oncology

## 2015-06-15 VITALS — BP 125/78 | HR 88 | Temp 97.9°F | Resp 18 | Ht 63.0 in | Wt 232.3 lb

## 2015-06-15 DIAGNOSIS — C50412 Malignant neoplasm of upper-outer quadrant of left female breast: Secondary | ICD-10-CM

## 2015-06-15 DIAGNOSIS — F419 Anxiety disorder, unspecified: Secondary | ICD-10-CM

## 2015-06-15 DIAGNOSIS — M797 Fibromyalgia: Secondary | ICD-10-CM

## 2015-06-15 DIAGNOSIS — C50912 Malignant neoplasm of unspecified site of left female breast: Secondary | ICD-10-CM

## 2015-06-15 DIAGNOSIS — M2550 Pain in unspecified joint: Secondary | ICD-10-CM | POA: Diagnosis not present

## 2015-06-15 DIAGNOSIS — Z170 Estrogen receptor positive status [ER+]: Secondary | ICD-10-CM | POA: Diagnosis not present

## 2015-06-15 DIAGNOSIS — I890 Lymphedema, not elsewhere classified: Secondary | ICD-10-CM

## 2015-06-15 DIAGNOSIS — E2839 Other primary ovarian failure: Secondary | ICD-10-CM

## 2015-06-15 LAB — COMPREHENSIVE METABOLIC PANEL
ALT: 11 U/L (ref 0–55)
AST: 19 U/L (ref 5–34)
Albumin: 3.8 g/dL (ref 3.5–5.0)
Alkaline Phosphatase: 70 U/L (ref 40–150)
Anion Gap: 11 mEq/L (ref 3–11)
BUN: 20.9 mg/dL (ref 7.0–26.0)
CO2: 28 mEq/L (ref 22–29)
Calcium: 9.8 mg/dL (ref 8.4–10.4)
Chloride: 102 mEq/L (ref 98–109)
Creatinine: 0.8 mg/dL (ref 0.6–1.1)
EGFR: >90 mL/min/{1.73_m2} (ref >90)
Glucose: 97 mg/dl (ref 70–140)
Potassium: 3.3 mEq/L — ABNORMAL LOW (ref 3.5–5.1)
Sodium: 141 mEq/L (ref 136–145)
Total Bilirubin: 1.03 mg/dL (ref 0.20–1.20)
Total Protein: 7.2 g/dL (ref 6.4–8.3)

## 2015-06-15 LAB — CBC WITH DIFFERENTIAL/PLATELET
BASO%: 0.6 % (ref 0.0–2.0)
Basophils Absolute: 0 10*3/uL (ref 0.0–0.1)
EOS%: 3.2 % (ref 0.0–7.0)
Eosinophils Absolute: 0.1 10*3/uL (ref 0.0–0.5)
HCT: 36.2 % (ref 34.8–46.6)
HGB: 11.8 g/dL (ref 11.6–15.9)
LYMPH%: 48.4 % (ref 14.0–49.7)
MCH: 26.9 pg (ref 25.1–34.0)
MCHC: 32.5 g/dL (ref 31.5–36.0)
MCV: 82.7 fL (ref 79.5–101.0)
MONO#: 0.3 10*3/uL (ref 0.1–0.9)
MONO%: 11.3 % (ref 0.0–14.0)
NEUT#: 1.1 10*3/uL — ABNORMAL LOW (ref 1.5–6.5)
NEUT%: 36.5 % — ABNORMAL LOW (ref 38.4–76.8)
Platelets: 160 10*3/uL (ref 145–400)
RBC: 4.38 10*6/uL (ref 3.70–5.45)
RDW: 13.6 % (ref 11.2–14.5)
WBC: 2.9 10*3/uL — ABNORMAL LOW (ref 3.9–10.3)
lymph#: 1.4 10*3/uL (ref 0.9–3.3)

## 2015-06-15 MED ORDER — ANASTROZOLE 1 MG PO TABS: 1.0000 mg | ORAL_TABLET | Freq: Every day | ORAL | Status: DC

## 2015-06-15 NOTE — Telephone Encounter (Signed)
Gv pt appt for 06/14/16 @ 9.30am.

## 2015-06-15 NOTE — Progress Notes (Signed)
[place]  [phone] [phone]  OFFICE PROGRESS NOTE   ID: RONEISHA STERN   DOB: November 28, 1960  MR#: [id number]  FXT#:[id number]  PCP: Vonna Drafts., FNP GYN:  SU: Excell Seltzer, MD OTHER MD: Earnie Larsson,  Theodis Sato, MD, Alysia Penna mD  CHIEF COMPLAINT: left breast cancer CURRENT THERAPY: anastrozole 57m daily  BREAST CANCER HISTORY: From Dr. RJulien Girtnote 06/13/2011:   "The patient first noticed a lump in the upper outer left breast about mid November. She had been having regular mammograms. She consulted her physician and was referred to the breast center for further workup. Bilateral mammogram was performed which revealed an irregular mass in the upper outer quadrant of the left breast at the 2:00 position measuring approximately 3 cm. A clearly enlarged left axillary lymph node was also seen. 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 Ki-67 was 100%. Subsequent bilateral breast MRI was performed. This reveals abnormal enhancement in the area of the mass measuring 2.9 x 5.2 cm. Again noted was an approximately 3 cm axillary lymph node. No other areas were detected."   Her subsequent history is as detailed below.  INTERVAL HISTORY: MDevenreturns today for follow up of her breast cancer. She continues on anastrozole daily and tolerates this well. She has some hot flashes, but denies vaginal changes. She has a history of fibromyalgia, osteoarthritis to her back and multiple joints, and bilateral knee replacements (performed at different times) last year. She does not believe the anastrozole has impacted her in a significant way with these issues. She ambulates with a cane and has a gait disturbance due to the knee pain. She takes lyrica TID for residual neuropathy to her hands and feet and  tramadol PRN for the joint pain. The interval history is remarkable for a move to a handicap accessible apartment, where she will no longer have to use stairs to get into her home.   REVIEW OF SYSTEMS: MDaunehas bilateral lower extremity edema as well as chronic left upper arm lymphedema. She no longer wears her sleeve full time. She does not sleep well at night and is chronically fatigued. Her daughter helps her with some tasks, but for the most part MSimarais independent. She uses xanax PRN for anxiety. A detailed review of systems is otherwise stable.  PAST MEDICAL HISTORY: Past Medical History  Diagnosis Date  . Night sweats   . Fatigue   . Wears glasses   . Arthritis   . Hot flashes   . Blood transfusion   . Diverticulitis of colon   . Headache(784.0)   . Anemia   . Bursitis   . Carpal tunnel syndrome   . Fibromyalgia 08/2012  . Breast cancer ([facility]     T3N1 invasive ductal carcinoma left breast  . SOB (shortness of breath) on exertion     walking and stairs  . Pneumonia     hx of  . Anxiety   . Depression   . GERD (gastroesophageal reflux disease)     PAST SURGICAL HISTORY: Past Surgical History  Procedure Laterality Date  . Cholecystectomy    . Appendectomy    . Carpal tunnel release      Bilateral  . Tonsillectomy    . Spur      Apex spur on both big toes  . Knee surgery  Left Knee  . Portacath placement  06/18/2011    Procedure: INSERTION PORT-A-CATH;  Surgeon: Edward Jolly, MD;  Location: [place];  Service: General;  Laterality: Right;  right subclavian  . Abdominal hysterectomy      still has ovaries  . Axillary lymph node dissection  11/28/2011    Procedure: AXILLARY LYMPH NODE DISSECTION;  Surgeon: Edward Jolly, MD;  Location: [place];  Service: General;  Laterality: Left;  . Toe surgery Bilateral   . Breast surgery Left 2013  . Eye surgery Left     cataract removal  . Total knee arthroplasty Left 09/13/2014  .  Total knee arthroplasty Left 09/13/2014    Procedure: LEFT TOTAL KNEE ARTHROPLASTY;  Surgeon: Rod Can, MD;  Location: [place];  Service: Orthopedics;  Laterality: Left;  . Total knee arthroplasty Right 03/10/2015    Procedure: RIGHT TOTAL KNEE ARTHROPLASTY;  Surgeon: Rod Can, MD;  Location: [facility];  Service: Orthopedics;  Laterality: Right;    FAMILY HISTORY Family History  Problem Relation Age of Onset  . Cancer Paternal Grandmother     unknown  . Hypertension Mother   . Diabetes Mother   . Hypertension Father   . Diabetes Father   Her father, 35 years old is the minister at [employer] she attends. Her mother is 57. The patient has 3 brothers and 3 sisters. There is no history of breast or ovarian cancer in the immediate family.  GYNECOLOGIC HISTORY: Menarche age 55, first live birth age 55, she is [place] P3. She underwent hysterectomy without salpingo-oophorectomy in 2000. She never took hormone replacement.  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 She is divorced. At home she lives with herr daughter Ree Edman and her 4 . Her son Shenea Giacobbe works as a Administrator. The patient has 6 additional grandchildren. She attends a [employer].    ADVANCED DIRECTIVES: Not in place  HEALTH MAINTENANCE: Social History  Substance Use Topics  . Smoking status: Never Smoker   . Smokeless tobacco: Never Used  . Alcohol Use: No     Colonoscopy: Not on file  PAP: Scheduled for January 2016  Bone density: Bone density December 2015 was normal.  Lipid panel: Not on file  Allergies  Allergen Reactions  . Compazine Other (See Comments)    Numbness of face and lips  . Aleve [Naproxen] Nausea Only  . Oxycodone     hallucinations  . Penicillins Nausea Only    Has patient had a PCN reaction causing immediate rash, facial/tongue/throat swelling, SOB or lightheadedness with hypotension: No Has patient had a PCN reaction causing  severe rash involving mucus membranes or skin necrosis: No Has patient had a PCN reaction that required hospitalization No Has patient had a PCN reaction occurring within the last 10 years: No If all of the above answers are "NO", then may proceed with Cephalosporin use.     Current Outpatient Prescriptions  Medication Sig Dispense Refill  . ALPRAZolam (XANAX) 0.5 MG tablet Take 0.5 mg by mouth 2 (two) times daily.     Marland Kitchen docusate sodium (COLACE) 100 MG capsule Take 100 mg by mouth daily.     Marland Kitchen omeprazole (PRILOSEC) 20 MG capsule Take 20 mg by mouth daily.   0  . pregabalin (LYRICA) 100 MG capsule Take 1 capsule (100 mg total) by mouth 3 (three) times daily. 90 capsule 5  . traMADol (ULTRAM) 50 MG tablet TAKE 1  TABLET BY MOUTH EVERY 6 HOURS AS NEEDED FOR PAIN (MUST LAST 30 DAYS) 120 tablet 2  . triamterene-hydrochlorothiazide (DYAZIDE) 37.5-25 MG per capsule Take 1 capsule by mouth daily.    Marland Kitchen anastrozole (ARIMIDEX) 1 MG tablet Take 1 tablet (1 mg total) by mouth daily. 30 tablet 5  . ondansetron (ZOFRAN) 4 MG tablet Take 1 tablet (4 mg total) by mouth every 6 (six) hours as needed for nausea. (Patient not taking: Reported on 04/26/2015) 20 tablet 0   No current facility-administered medications for this visit.    OBJECTIVE: Middle-aged [place] American woman who appears older than stated age 73 Vitals:   06/15/15 1314  BP: 125/78  Pulse: 88  Temp: 97.9 F (36.6 C)  Resp: 18     Body mass index is 41.16 kg/(m^2).    ECOG FS: 2  Skin: warm, dry  HEENT: sclerae anicteric, conjunctivae pink, oropharynx clear. No thrush or mucositis.  Lymph Nodes: No cervical or supraclavicular lymphadenopathy  Lungs: clear to auscultation bilaterally, no rales, wheezes, or rhonci  Heart: regular rate and rhythm  Abdomen: round, soft, non tender, positive bowel sounds  Musculoskeletal: No focal spinal tenderness, grade 1 left upper extremity lymphedema, gait disturbance from right knee pain Neuro:  non focal, well oriented, positive affect  Breasts: left breast status post lumpectomy and radiation. No evidence of recurrent disease. Left axilla benign. Right breast unremarkable.   LAB RESULTS: Lab Results  Component Value Date   WBC 2.9* 06/15/2015   NEUTROABS 1.1* 06/15/2015   HGB 11.8 06/15/2015   HCT 36.2 06/15/2015   MCV 82.7 06/15/2015   PLT 160 06/15/2015      Chemistry      Component Value Date/Time   NA 141 06/15/2015 1250   NA 138 03/11/2015 0445   K 3.3* 06/15/2015 1250   K 3.8 03/11/2015 0445   CL 103 03/11/2015 0445   CL 101 09/18/2012 0853   CO2 28 06/15/2015 1250   CO2 28 03/11/2015 0445   BUN 20.9 06/15/2015 1250   BUN 15 03/11/2015 0445   CREATININE 0.8 06/15/2015 1250   CREATININE 0.72 03/11/2015 0445      Component Value Date/Time   CALCIUM 9.8 06/15/2015 1250   CALCIUM 9.0 03/11/2015 0445   ALKPHOS 70 06/15/2015 1250   ALKPHOS 56 02/28/2015 1020   AST 19 06/15/2015 1250   AST 24 02/28/2015 1020   ALT 11 06/15/2015 1250   ALT 12* 02/28/2015 1020   BILITOT 1.03 06/15/2015 1250   BILITOT 0.7 02/28/2015 1020      Lab Results  Component Value Date   LABCA2 41* 06/13/2011    Urinalysis    Component Value Date/Time   COLORURINE YELLOW 09/02/2014 1044   APPEARANCEUR CLEAR 09/02/2014 1044   LABSPEC 1.012 09/02/2014 1044   PHURINE 6.0 09/02/2014 1044   GLUCOSEU NEGATIVE 09/02/2014 1044   HGBUR NEGATIVE 09/02/2014 1044   BILIRUBINUR NEGATIVE 09/02/2014 1044   KETONESUR NEGATIVE 09/02/2014 1044   PROTEINUR NEGATIVE 09/02/2014 1044   UROBILINOGEN 0.2 09/02/2014 1044   NITRITE NEGATIVE 09/02/2014 1044   LEUKOCYTESUR NEGATIVE 09/02/2014 1044    STUDIES: Mm Diag Breast Tomo Bilateral  05/31/2015  CLINICAL DATA:  Annual diagnostic imaging. Patient underwent lumpectomy in 2013 for breast carcinoma. No current complaints. EXAM: DIGITAL DIAGNOSTIC BILATERAL MAMMOGRAM WITH 3D TOMOSYNTHESIS AND CAD COMPARISON:  Previous exam(s). ACR Breast  Density Category b: There are scattered areas of fibroglandular density. FINDINGS: Architectural distortion in the posterior upper outer left breast reflecting the  postsurgical scarring is stable. There are no new or suspicious masses, no other areas of architectural distortion and no suspicious calcifications Mammographic images were processed with CAD. IMPRESSION: No evidence of recurrent or new breast malignancy. Benign postsurgical changes on the left. RECOMMENDATION: Diagnostic mammography in 1 year per standard post lumpectomy protocol. I have discussed the findings and recommendations with the patient. Results were also provided in writing at the conclusion of the visit. If applicable, a reminder letter will be sent to the patient regarding the next appointment. BI-RADS CATEGORY  2: Benign. Electronically Signed   By: Lajean Manes M.D.   On: 05/31/2015 10:24      ASSESSMENT: 54 y.o. [place], [place] woman:  (1) Status post left breast biopsy 06/01/2011 for a cT2 pN1, stage IIB invasive ductal carcinoma, grade 3, estrogen receptor 80% and progesterone receptor 9% positive, with no HER-2 amplification and an MIB-1 of 100%  (2) Treated neoadjuvantly with 4 cycles of cyclophosphamide, epirubicin and fluorouracil, followed by 4 cycles of docetaxel, completed 11/01/2011  (3) Status post left lumpectomy and axillary lymph node resection 11/28/2011, showing a complete pathologic response (no residual invasive or in situ cancer in the breast and 0 of 17 lymph nodes involved)  (4) Adjuvant radiation therapy completed 03/31/2012  (5) Started anastrozole November 2013; normal bone density scan 05/26/2014  (6) chronic Left upper extremity lymphedema  (7) fibromyalgia and osteoarthritis with polyarthralgia  PLAN:  None of Alichia's pressing issues are related to her breast cancer. Her fibromyalgia and osteoarthritis far exceed any effects the anastrozole might have on her. She will reach  her 5 years of antiestrogen therapy in November 2018. She is now 3.5 years out from her definitive surgery with no evidence of recurrent disease. Her most recent mammogram was benign.   She will be due for a repeat bone density scan in December with her next mammogram, and I have placed the appropriate orders.  Joury will return in 1 year for labs and a follow up visit. She understands and agrees with this plan. She knows the goal of treatment in her case is cure. She has been encouraged to call with any issues that might arise before her next visit here.   Laurie Panda, NP   06/15/2015   1:56 PM

## 2015-06-16 ENCOUNTER — Encounter: Payer: Self-pay | Admitting: *Deleted

## 2015-06-27 MED FILL — LYRICA 100 MG CAPSULE: 100 | 30 days supply | Qty: 90 | Fill #4

## 2015-06-27 MED FILL — traMADol HCL 50 MG TABS: 50 | 30 days supply | Qty: 120 | Fill #2

## 2015-07-04 MED FILL — ANASTROZOLE 1 MG TABLET: 1 | 30 days supply | Qty: 30 | Fill #4

## 2015-07-28 ENCOUNTER — Ambulatory Visit (AMBULATORY_SURGERY_CENTER): Payer: Self-pay | Admitting: *Deleted

## 2015-07-28 VITALS — Ht 63.0 in | Wt 231.4 lb

## 2015-07-28 DIAGNOSIS — Z86010 Personal history of colonic polyps: Secondary | ICD-10-CM

## 2015-07-28 NOTE — Progress Notes (Signed)
No allergies to eggs or soy. No problems with anesthesia.  Pt given Emmi instructions for colonoscopy  No oxygen use  No diet drug use  

## 2015-07-29 ENCOUNTER — Other Ambulatory Visit: Payer: Self-pay | Admitting: Physical Medicine & Rehabilitation

## 2015-07-29 MED FILL — LYRICA 100 MG CAPSULE: 100 | 30 days supply | Qty: 90 | Fill #0

## 2015-07-29 MED FILL — ANASTROZOLE 1 MG TABLET: 1 | 30 days supply | Qty: 30 | Fill #5

## 2015-07-29 MED FILL — traMADol HCL 50 MG TABS: 50 | 30 days supply | Qty: 120 | Fill #0

## 2015-08-01 ENCOUNTER — Ambulatory Visit: Payer: Medicare Other | Admitting: Dietician

## 2015-08-04 ENCOUNTER — Encounter: Payer: Self-pay | Admitting: Physical Medicine & Rehabilitation

## 2015-08-04 ENCOUNTER — Ambulatory Visit (HOSPITAL_BASED_OUTPATIENT_CLINIC_OR_DEPARTMENT_OTHER): Payer: Medicare Other | Admitting: Physical Medicine & Rehabilitation

## 2015-08-04 ENCOUNTER — Encounter: Payer: Medicare Other | Attending: Physical Medicine & Rehabilitation

## 2015-08-04 VITALS — BP 120/75 | HR 84 | Resp 14

## 2015-08-04 DIAGNOSIS — Z96652 Presence of left artificial knee joint: Secondary | ICD-10-CM | POA: Insufficient documentation

## 2015-08-04 DIAGNOSIS — Z853 Personal history of malignant neoplasm of breast: Secondary | ICD-10-CM | POA: Diagnosis not present

## 2015-08-04 DIAGNOSIS — G8929 Other chronic pain: Secondary | ICD-10-CM | POA: Diagnosis not present

## 2015-08-04 DIAGNOSIS — M545 Low back pain: Secondary | ICD-10-CM | POA: Diagnosis not present

## 2015-08-04 DIAGNOSIS — M1711 Unilateral primary osteoarthritis, right knee: Secondary | ICD-10-CM | POA: Insufficient documentation

## 2015-08-04 DIAGNOSIS — D649 Anemia, unspecified: Secondary | ICD-10-CM | POA: Diagnosis not present

## 2015-08-04 DIAGNOSIS — M7989 Other specified soft tissue disorders: Secondary | ICD-10-CM | POA: Insufficient documentation

## 2015-08-04 DIAGNOSIS — Z90710 Acquired absence of both cervix and uterus: Secondary | ICD-10-CM | POA: Insufficient documentation

## 2015-08-04 DIAGNOSIS — G620 Drug-induced polyneuropathy: Secondary | ICD-10-CM | POA: Diagnosis present

## 2015-08-04 DIAGNOSIS — M47896 Other spondylosis, lumbar region: Secondary | ICD-10-CM | POA: Diagnosis not present

## 2015-08-04 DIAGNOSIS — Z9049 Acquired absence of other specified parts of digestive tract: Secondary | ICD-10-CM | POA: Insufficient documentation

## 2015-08-04 DIAGNOSIS — F419 Anxiety disorder, unspecified: Secondary | ICD-10-CM | POA: Insufficient documentation

## 2015-08-04 DIAGNOSIS — IMO0002 Reserved for concepts with insufficient information to code with codable children: Secondary | ICD-10-CM

## 2015-08-04 DIAGNOSIS — M2142 Flat foot [pes planus] (acquired), left foot: Secondary | ICD-10-CM

## 2015-08-04 DIAGNOSIS — M797 Fibromyalgia: Secondary | ICD-10-CM | POA: Insufficient documentation

## 2015-08-04 DIAGNOSIS — G5600 Carpal tunnel syndrome, unspecified upper limb: Secondary | ICD-10-CM | POA: Insufficient documentation

## 2015-08-04 DIAGNOSIS — M47817 Spondylosis without myelopathy or radiculopathy, lumbosacral region: Secondary | ICD-10-CM

## 2015-08-04 DIAGNOSIS — Z923 Personal history of irradiation: Secondary | ICD-10-CM | POA: Diagnosis not present

## 2015-08-04 DIAGNOSIS — K219 Gastro-esophageal reflux disease without esophagitis: Secondary | ICD-10-CM | POA: Insufficient documentation

## 2015-08-04 DIAGNOSIS — R5383 Other fatigue: Secondary | ICD-10-CM | POA: Diagnosis not present

## 2015-08-04 DIAGNOSIS — R51 Headache: Secondary | ICD-10-CM | POA: Diagnosis not present

## 2015-08-04 DIAGNOSIS — M2141 Flat foot [pes planus] (acquired), right foot: Secondary | ICD-10-CM | POA: Insufficient documentation

## 2015-08-04 DIAGNOSIS — G622 Polyneuropathy due to other toxic agents: Secondary | ICD-10-CM

## 2015-08-04 MED ORDER — CYCLOBENZAPRINE HCL 5 MG PO TABS: 5.0000 mg | ORAL_TABLET | Freq: Every day | ORAL | Status: DC

## 2015-08-04 MED ORDER — PREGABALIN 100 MG PO CAPS: 100.0000 mg | ORAL_CAPSULE | Freq: Three times a day (TID) | ORAL | Status: DC

## 2015-08-04 MED FILL — CYCLOBENZAPRINE 5 MG TABLET: 5 | 30 days supply | Qty: 30 | Fill #0

## 2015-08-04 NOTE — Progress Notes (Signed)
Subjective:    Patient ID: Kelsey Wilson, female    DOB: [birth year], 55 y.o.   MRN: [id number]  HPI 55 year old female treated at this clinic mainly for peripheral neuropathy as well as knee osteoarthritis. Her back has been bothering her more over the last several months. She has seen physical medicine rehabilitation specialist Dr. Nelva Bush over at [place] and has an appointment with the surgeon. Her main complaint is low back pain. Reviewed MRI findings from [facility] performed in February 2017. 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 In addition she has severe central and moderate left lateral recess spinal stenosis at L4-L5. She does not have any worse pain on the left side compared to the right side in terms of the lower extremity. No bowel or bladder issues    Pain Inventory Average Pain 10 Pain Right Now 10 My pain is aching  In the last 24 hours, has pain interfered with the following? General activity 2 Relation with others 2 Enjoyment of life 2 What TIME of day is your pain at its worst? all Sleep (in general) Poor  Pain is worse with: walking, bending, sitting and standing Pain improves with: no relief Relief from Meds: 0  Mobility walk with assistance use a cane ability to climb steps?  no do you drive?  yes Do you have any goals in this area?  no  Function disabled: date disabled . I need assistance with the following:  meal prep, household duties and shopping  Neuro/Psych numbness tingling trouble walking spasms  Prior Studies Any changes since last visit?  yes x-rays  Physicians involved in your care Any changes since last visit?  no   Family History  Problem Relation Age of Onset  . Cancer Paternal Grandmother     unknown  . Hypertension Mother   . Diabetes Mother   . Hypertension Father   . Diabetes Father   . Colon cancer Neg Hx    Social History   Social History  . Marital Status: Single   Spouse Name: N/A  . Number of Children: N/A  . Years of Education: N/A   Social History Main Topics  . Smoking status: Never Smoker   . Smokeless tobacco: Never Used  . Alcohol Use: No  . Drug Use: No  . Sexual Activity: No   Other Topics Concern  . None   Social History Narrative   Past Surgical History  Procedure Laterality Date  . Cholecystectomy    . Appendectomy    . Carpal tunnel release      Bilateral  . Tonsillectomy    . Spur      Apex spur on both big toes  . Knee surgery      Left Knee  . Portacath placement  06/18/2011    Procedure: INSERTION PORT-A-CATH;  Surgeon: Edward Jolly, MD;  Location: [facility];  Service: General;  Laterality: Right;  right subclavian  . Abdominal hysterectomy      still has ovaries  . Axillary lymph node dissection  11/28/2011    Procedure: AXILLARY LYMPH NODE DISSECTION;  Surgeon: Edward Jolly, MD;  Location: [facility];  Service: General;  Laterality: Left;  . Toe surgery Bilateral   . Breast surgery Left 2013  . Eye surgery Left     cataract removal  . Total knee arthroplasty Left 09/13/2014  . Total knee arthroplasty Left 09/13/2014    Procedure: LEFT TOTAL KNEE ARTHROPLASTY;  Surgeon: Rod Can, MD;  Location: [facility];  Service: Orthopedics;  Laterality: Left;  . Total knee arthroplasty Right 03/10/2015    Procedure: RIGHT TOTAL KNEE ARTHROPLASTY;  Surgeon: Rod Can, MD;  Location: [facility];  Service: Orthopedics;  Laterality: Right;  . Removal portacath  2014   Past Medical History  Diagnosis Date  . Night sweats   . Fatigue   . Wears glasses   . Arthritis   . Hot flashes   . Blood transfusion   . Diverticulitis of colon   . Headache(784.0)   . Anemia   . Bursitis   . Carpal tunnel syndrome   . Fibromyalgia 08/2012  . SOB (shortness of breath) on exertion     walking and stairs  . Pneumonia     hx of  . Anxiety   . Depression   . GERD (gastroesophageal reflux disease)   . Breast  cancer ([facility]) 2010    T3N1 invasive ductal carcinoma left breast  . Blood transfusion without reported diagnosis 2004    after hysterectomy   BP 120/75 mmHg  Pulse 84  Resp 14  SpO2 99%  Opioid Risk Score:   Fall Risk Score:  `1  Depression screen PHQ 2/9  Depression screen [facility] 2/9 04/26/2015 02/04/2015  Decreased Interest 0 0  Down, Depressed, Hopeless 0 0  PHQ - 2 Score 0 0     Review of Systems  Constitutional: Positive for diaphoresis.  Cardiovascular: Positive for leg swelling.  All other systems reviewed and are negative.      Objective:   Physical Exam  Constitutional: She is oriented to person, place, and time.  Neurological: She is alert and oriented to person, place, and time.  Psychiatric: She has a normal mood and affect.   Decreased lumbar range of motion with extension more so than with flexion. Flexion is about 50% extension 0. Patient with decreased sensation in the right lower extremity in a nondermatomal pattern compared left lower extremity. Negative straight leg raise test There is 1-2+ edema right lower extremity, trace to 1+ edema left lower extremity  Motor strength is 4/5 hip flexor and extensor and ankle dorsiflexor    Assessment & Plan:  1. Chronic peripheral neuropathy following chemotherapy for breast cancer. Her breast cancer is currently in remission, On chronic Arimidex Her peripheral neuropathy pain is stable. Continue on current dose of Lyrica 100 mg 3 times a day in Combination with tramadol 50 mg 4 times a day  2. Right knee osteoarthritis , Planning to have surgery,   3. Lumbar spondylosis multilevel, there is no significant radicular component. Her lower extremity numbness is mainly due to her peripheral neuropathy As discussed with the patient since her pain is mainly axial load recommend medial branch blocks rather than epidural injection We'll schedule in 2 weeks. Further treatment recommendations based on response to treatment of  L3-L4 medial branches and L5 dorsal ramus  We discussed the procedure using spine model as well as diagrams. Discuss treatment alternatives.   4. Lower extremity swelling following up with primary care physician on this. Advise elevating legs when she is sitting

## 2015-08-08 ENCOUNTER — Ambulatory Visit (AMBULATORY_SURGERY_CENTER): Payer: Medicare Other | Admitting: Gastroenterology

## 2015-08-08 ENCOUNTER — Encounter: Payer: Self-pay | Admitting: Gastroenterology

## 2015-08-08 VITALS — BP 128/69 | HR 78 | Temp 97.8°F | Resp 18 | Ht 60.0 in | Wt 231.0 lb

## 2015-08-08 DIAGNOSIS — D129 Benign neoplasm of anus and anal canal: Secondary | ICD-10-CM

## 2015-08-08 DIAGNOSIS — Z1211 Encounter for screening for malignant neoplasm of colon: Secondary | ICD-10-CM | POA: Diagnosis present

## 2015-08-08 DIAGNOSIS — D128 Benign neoplasm of rectum: Secondary | ICD-10-CM

## 2015-08-08 MED ORDER — SODIUM CHLORIDE 0.9 % IV SOLN: 500.0000 mL | INTRAVENOUS | Status: DC

## 2015-08-08 NOTE — Progress Notes (Signed)
To recovery, report to Mirts, RN, VSS. 

## 2015-08-08 NOTE — Op Note (Signed)
[facility]  [address] [place], [address]   COLONOSCOPY PROCEDURE REPORT  PATIENT: Kelsey, Wilson  MR#: [id number] BIRTHDATE: Jul 02, 1960 , 26  yrs. old GENDER: female ENDOSCOPIST: Wilfrid Lund, MD REFERRED [id number] Pavelock, MD PROCEDURE DATE:  08/08/2015 PROCEDURE:   Colonoscopy, screening First Screening Colonoscopy - Avg.  risk and is 50 yrs.  old or older - No.  Prior Negative Screening - Now for repeat screening. 10 or more years since last screening  History of Adenoma - Now for follow-up colonoscopy & has been > or = to 3 yrs.  N/A  Polyps removed today? Yes ASA CLASS:   Class III INDICATIONS:Screening for colonic neoplasia and Colorectal Neoplasm Risk Assessment for this procedure is average risk. MEDICATIONS: Monitored anesthesia care and Propofol 280 mg IV  DESCRIPTION OF PROCEDURE:   After the risks benefits and alternatives of the procedure were thoroughly explained, informed consent was obtained.  The digital rectal exam revealed no abnormalities of the rectum.   The LB [id number]  endoscope was introduced through the anus and advanced to the cecum, which was identified by both the appendix and ileocecal valve. No adverse events experienced.   The quality of the prep was excellent. (Suprep was used)  The instrument was then slowly withdrawn as the colon was fully examined. Estimated blood loss is zero unless otherwise noted in this procedure report.      COLON FINDINGS: There was mild diverticulosis noted in the left colon.   A sessile polyp measuring 2 mm in size was found in the rectum.  A polypectomy was performed with cold forceps.  The resection was complete, the polyp tissue was completely retrieved and sent to histology.  Retroflexed views revealed no abnormalities. The time to cecum = 3.0 Withdrawal time = 6.7   The scope was withdrawn and the procedure completed. COMPLICATIONS: There were no immediate  complications.  ENDOSCOPIC IMPRESSION: 1.   There was mild diverticulosis noted in the left colon 2.   Sessile polyp was found in the rectum; polypectomy was performed with cold forceps  RECOMMENDATIONS: Repeat colonoscopy in 5 years if polyp adenomatous; otherwise 10 years  eSigned:  Wilfrid Lund, MD 08/08/2015 11:59 AM   cc:

## 2015-08-08 NOTE — Progress Notes (Signed)
Called to room to assist during endoscopic procedure.  Patient ID and intended procedure confirmed with present staff. Received instructions for my participation in the procedure from the performing physician.  

## 2015-08-08 NOTE — Patient Instructions (Signed)
YOU HAD AN ENDOSCOPIC PROCEDURE TODAY AT THE [facility]:   Refer to the procedure report that was given to you for any specific questions about what was found during the examination.  If the procedure report does not answer your questions, please call your gastroenterologist to clarify.  If you requested that your care partner not be given the details of your procedure findings, then the procedure report has been included in a sealed envelope for you to review at your convenience later.  YOU SHOULD EXPECT: Some feelings of bloating in the abdomen. Passage of more gas than usual.  Walking can help get rid of the air that was put into your GI tract during the procedure and reduce the bloating. If you had a lower endoscopy (such as a colonoscopy or flexible sigmoidoscopy) you may notice spotting of blood in your stool or on the toilet paper. If you underwent a bowel prep for your procedure, you may not have a normal bowel movement for a few days.  Please Note:  You might notice some irritation and congestion in your nose or some drainage.  This is from the oxygen used during your procedure.  There is no need for concern and it should clear up in a day or so.  SYMPTOMS TO REPORT IMMEDIATELY:   Following lower endoscopy (colonoscopy or flexible sigmoidoscopy):  Excessive amounts of blood in the stool  Significant tenderness or worsening of abdominal pains  Swelling of the abdomen that is new, acute  Fever of 100F or higher    For urgent or emergent issues, a gastroenterologist can be reached at any hour by calling [phone].   DIET: Your first meal following the procedure should be a small meal and then it is ok to progress to your normal diet. Heavy or fried foods are harder to digest and may make you feel nauseous or bloated.  Likewise, meals heavy in dairy and vegetables can increase bloating.  Drink plenty of fluids but you should avoid alcoholic beverages for 24  hours.  ACTIVITY:  You should plan to take it easy for the rest of today and you should NOT DRIVE or use heavy machinery until tomorrow (because of the sedation medicines used during the test).    FOLLOW UP: Our staff will call the number listed on your records the next business day following your procedure to check on you and address any questions or concerns that you may have regarding the information given to you following your procedure. If we do not reach you, we will leave a message.  However, if you are feeling well and you are not experiencing any problems, there is no need to return our call.  We will assume that you have returned to your regular daily activities without incident.  If any biopsies were taken you will be contacted by phone or by letter within the next 1-3 weeks.  Please call us at [phone] if you have not heard about the biopsies in 3 weeks.    SIGNATURES/CONFIDENTIALITY: You and/or your care partner have signed paperwork which will be entered into your electronic medical record.  These signatures attest to the fact that that the information above on your After Visit Summary has been reviewed and is understood.  Full responsibility of the confidentiality of this discharge information lies with you and/or your care-partner.    INFORMATION ON POLYPS,DIVERTICULOSIS ,AND HIGH FIBER DIET GIVEN TO YOU TODAY 

## 2015-08-09 ENCOUNTER — Telehealth: Payer: Self-pay | Admitting: *Deleted

## 2015-08-09 NOTE — Telephone Encounter (Signed)
  Follow up Call-  Call back number 08/08/2015  Post procedure Call Back phone  # [phone]  Permission to leave phone message Yes     Patient questions:  Do you have a fever, pain , or abdominal swelling? No. Pain Score  0 *  Have you tolerated food without any problems? Yes.    Have you been able to return to your normal activities? Yes.    Do you have any questions about your discharge instructions: Diet   No. Medications  No. Follow up visit  No.  Do you have questions or concerns about your Care? No.  Actions: * If pain score is 4 or above: No action needed, pain <4.

## 2015-08-12 ENCOUNTER — Encounter: Payer: Self-pay | Admitting: Gastroenterology

## 2015-08-23 ENCOUNTER — Encounter: Payer: Self-pay | Admitting: Physical Medicine & Rehabilitation

## 2015-08-23 ENCOUNTER — Ambulatory Visit (HOSPITAL_BASED_OUTPATIENT_CLINIC_OR_DEPARTMENT_OTHER): Payer: Medicare Other | Admitting: Physical Medicine & Rehabilitation

## 2015-08-23 VITALS — BP 118/65 | HR 72 | Resp 14

## 2015-08-23 DIAGNOSIS — G8929 Other chronic pain: Secondary | ICD-10-CM | POA: Diagnosis not present

## 2015-08-23 DIAGNOSIS — M47817 Spondylosis without myelopathy or radiculopathy, lumbosacral region: Secondary | ICD-10-CM

## 2015-08-23 DIAGNOSIS — M47816 Spondylosis without myelopathy or radiculopathy, lumbar region: Secondary | ICD-10-CM | POA: Diagnosis not present

## 2015-08-23 NOTE — Progress Notes (Signed)
Bilateral Lumbar L3, L4  medial branch blocks and L 5 dorsal ramus injection under fluoroscopic guidance  Indication: Lumbar pain which is not relieved by medication management or other conservative care and interfering with self-care and mobility.  Informed consent was obtained after describing risks and benefits of the procedure with the patient, this includes bleeding, infection, paralysis and medication side effects.  The patient wishes to proceed and has given written consent.  The patient was placed in prone position.  The lumbar area was marked and prepped with Betadine.  One mL of 1% lidocaine was injected into each of 6 areas into the skin and subcutaneous tissue.  Then a 22-gauge 5in spinal needle was inserted targeting the junction of the left S1 superior articular process and sacral ala junction. Needle was advanced under fluoroscopic guidance.  Bone contact was made.  Omnipaque 180 was injected x 0.5 mL demonstrating no intravascular uptake.  Then a solution containing one mL of 4 mg per mL dexamethasone and 3 mL of 2% MPF lidocaine was injected x 0.5 mL.  Then the left L5 superior articular process in transverse process junction was targeted.  Bone contact was made.  Omnipaque 180 was injected x 0.5 mL demonstrating no intravascular uptake. Then a solution containing one mL of 4 mg per mL dexamethasone and 3 mL of 2% MPF lidocaine was injected x 0.5 mL.  Then the left L4 superior articular process in transverse process junction was targeted.  Bone contact was made.  Omnipaque 180 was injected x 0.5 mL demonstrating no intravascular uptake.  Then a solution containing one mL of 4 mg per mL dexamethasone and 3 mL if 2% MPF lidocaine was injected x 0.5 mL.  This same procedure was performed on the right side using the same needle, technique and injectate.  Patient tolerated procedure well.  Post procedure instructions were given. 

## 2015-08-23 NOTE — Progress Notes (Signed)
  PROCEDURE RECORD [facility]   Name: Kelsey Wilson DOB:[birth year] MRN: [id number]  Date:08/23/2015  Physician: Alysia Penna, MD    Nurse/CMA: Mancel Parsons  Allergies:  Allergies  Allergen Reactions  . Compazine Other (See Comments)    Numbness of face and lips  . Aleve [Naproxen] Nausea Only  . Oxycodone     hallucinations  . Penicillins Nausea Only    Has patient had a PCN reaction causing immediate rash, facial/tongue/throat swelling, SOB or lightheadedness with hypotension: No Has patient had a PCN reaction causing severe rash involving mucus membranes or skin necrosis: No Has patient had a PCN reaction that required hospitalization No Has patient had a PCN reaction occurring within the last 10 years: No If all of the above answers are "NO", then may proceed with Cephalosporin use.     Consent Signed: Yes.    Is patient diabetic? No.  CBG today? n/a  Pregnant: No. LMP: No LMP recorded. Patient has had a hysterectomy. (age [age])  Anticoagulants: no Anti-inflammatory: no Antibiotics: no  Procedure: bilateral medial branch block  Position: Prone Start Time: 11:52 am  End Time:  12:01pm  Fluoro Time: 43  RN/CMA Rolan Bucco Manraj Yeo    Time 11:18am 12:05pm    BP 118/65 118/71    Pulse 72 75    Respirations 14 14    O2 Sat 100 99    S/S 6 6    Pain Level 10/10 7/10     D/C home with Lenard Simmer, patient A & O X 3, D/C instructions reviewed, and sits independently.

## 2015-08-23 NOTE — Patient Instructions (Signed)

## 2015-09-02 ENCOUNTER — Other Ambulatory Visit: Payer: Self-pay | Admitting: Physical Medicine & Rehabilitation

## 2015-09-02 MED FILL — CYCLOBENZAPRINE 5 MG TABLET: 5 | 30 days supply | Qty: 30 | Fill #1

## 2015-09-02 MED FILL — ANASTROZOLE 1 MG TABLET: 1 | 30 days supply | Qty: 30 | Fill #0

## 2015-09-02 MED FILL — traMADol HCL 50 MG TABS: 50 | 30 days supply | Qty: 120 | Fill #1

## 2015-09-02 MED FILL — LYRICA 100 MG CAPSULE: 100 | 30 days supply | Qty: 90 | Fill #0

## 2015-09-05 ENCOUNTER — Other Ambulatory Visit: Payer: Self-pay | Admitting: *Deleted

## 2015-09-05 MED ORDER — PREGABALIN 100 MG PO CAPS: 100.0000 mg | ORAL_CAPSULE | Freq: Three times a day (TID) | ORAL | Status: DC

## 2015-09-20 ENCOUNTER — Ambulatory Visit (HOSPITAL_BASED_OUTPATIENT_CLINIC_OR_DEPARTMENT_OTHER): Payer: Medicare Other | Admitting: Physical Medicine & Rehabilitation

## 2015-09-20 ENCOUNTER — Encounter: Payer: Medicare Other | Attending: Physical Medicine & Rehabilitation

## 2015-09-20 ENCOUNTER — Encounter: Payer: Self-pay | Admitting: Physical Medicine & Rehabilitation

## 2015-09-20 VITALS — BP 117/62 | HR 79

## 2015-09-20 DIAGNOSIS — M797 Fibromyalgia: Secondary | ICD-10-CM

## 2015-09-20 DIAGNOSIS — Z90710 Acquired absence of both cervix and uterus: Secondary | ICD-10-CM | POA: Diagnosis not present

## 2015-09-20 DIAGNOSIS — M47896 Other spondylosis, lumbar region: Secondary | ICD-10-CM | POA: Diagnosis not present

## 2015-09-20 DIAGNOSIS — R51 Headache: Secondary | ICD-10-CM | POA: Insufficient documentation

## 2015-09-20 DIAGNOSIS — M47817 Spondylosis without myelopathy or radiculopathy, lumbosacral region: Secondary | ICD-10-CM | POA: Diagnosis not present

## 2015-09-20 DIAGNOSIS — Z853 Personal history of malignant neoplasm of breast: Secondary | ICD-10-CM | POA: Insufficient documentation

## 2015-09-20 DIAGNOSIS — G5600 Carpal tunnel syndrome, unspecified upper limb: Secondary | ICD-10-CM | POA: Diagnosis not present

## 2015-09-20 DIAGNOSIS — Z9049 Acquired absence of other specified parts of digestive tract: Secondary | ICD-10-CM | POA: Insufficient documentation

## 2015-09-20 DIAGNOSIS — G8929 Other chronic pain: Secondary | ICD-10-CM | POA: Insufficient documentation

## 2015-09-20 DIAGNOSIS — Z96652 Presence of left artificial knee joint: Secondary | ICD-10-CM | POA: Diagnosis not present

## 2015-09-20 DIAGNOSIS — M7989 Other specified soft tissue disorders: Secondary | ICD-10-CM | POA: Insufficient documentation

## 2015-09-20 DIAGNOSIS — R5383 Other fatigue: Secondary | ICD-10-CM | POA: Diagnosis not present

## 2015-09-20 DIAGNOSIS — G620 Drug-induced polyneuropathy: Secondary | ICD-10-CM | POA: Diagnosis present

## 2015-09-20 DIAGNOSIS — F419 Anxiety disorder, unspecified: Secondary | ICD-10-CM | POA: Diagnosis not present

## 2015-09-20 DIAGNOSIS — M1711 Unilateral primary osteoarthritis, right knee: Secondary | ICD-10-CM | POA: Diagnosis present

## 2015-09-20 DIAGNOSIS — K219 Gastro-esophageal reflux disease without esophagitis: Secondary | ICD-10-CM | POA: Insufficient documentation

## 2015-09-20 DIAGNOSIS — M545 Low back pain: Secondary | ICD-10-CM | POA: Insufficient documentation

## 2015-09-20 DIAGNOSIS — Z923 Personal history of irradiation: Secondary | ICD-10-CM | POA: Insufficient documentation

## 2015-09-20 DIAGNOSIS — D649 Anemia, unspecified: Secondary | ICD-10-CM | POA: Diagnosis not present

## 2015-09-20 MED ORDER — CYCLOBENZAPRINE HCL 5 MG PO TABS: 5.0000 mg | ORAL_TABLET | Freq: Every day | ORAL | Status: DC

## 2015-09-20 NOTE — Patient Instructions (Signed)
Acupuncture  Acupuncture is a technique that is used in traditional Chinese medical treatment. Traditional Chinese medicine recognizes more than 2,000 points on the body that connect energy pathways (meridians) through the body. Acupuncture stimulates these points with needles that are inserted through your skin. The goal is to balance the physical, emotional, and mental energy in your body.  This treatment is done by a health care provider who has specialized training (licensed acupuncture practitioner). You may have this treatment for many reasons. For instance, many people have acupuncture to treat long-term or short-term pain. Others have acupuncture to treat conditions such as addiction, headaches, and arthritis. Some have it to help them recover from a stroke. Treatment often requires several acupuncture sessions. You may have acupuncture along with other medical treatments.  LET YOUR HEALTH CARE PROVIDER KNOW ABOUT:  · Any allergies you have.  · All medicines you are taking, including vitamins, herbs, eye drops, creams, and over-the-counter medicines.  · Any blood disorders you have.  · Previous surgeries you have had.  · Any medical conditions you may have.  RISKS AND COMPLICATIONS  Generally, this is a safe procedure. However, problems may occur, including:  · Skin infection.  · Damage to organs or structures beneath the skin.  BEFORE THE PROCEDURE  · Your acupuncture practitioner will ask about your medical history and your symptoms.  · You may have a physical exam.  PROCEDURE  · Your skin will be cleaned with a germ-killing (antiseptic) solution.  · Your acupuncture practitioner will open a new set of germ-free (sterile) needles.  · The needles will be inserted in your skin. They will be left in place for a certain length of time. You may feel slight pain or a tingling sensation.  · Your acupuncture practitioner may apply electrical energy to the needles.  · Your acupuncture practitioner may adjust the  needles in certain ways.  · After your procedure, the acupuncture practitioner will remove the needles, throw them away, and clean your skin.  The exact procedure that you have will depend on your condition and how your acupuncture provider treats it. The procedure may vary among health care providers.  AFTER THE PROCEDURE  · Keep all follow-up visits as directed by your health care provider. This is important.  · Let your acupuncture provider know if you have:    Soreness.    Skin redness or irritation.    Fever.     This information is not intended to replace advice given to you by your health care provider. Make sure you discuss any questions you have with your health care provider.     Document Released: 05/24/2003 Document Revised: 10/05/2014 Document Reviewed: 05/12/2014  [employer] Interactive Patient Education ©2016 [employer]

## 2015-09-20 NOTE — Progress Notes (Signed)
Subjective:    Patient ID: Kelsey Wilson, female    DOB: [birth year], 55 y.o.   MRN: [id number]  HPI Patient complaining of pain in the back as well as knees as well as feet. Patient has some sleep disturbance some muscle cramping at night. No longer taking cyclobenzaprine at night. Continues to take Lyrica 100 mg 3 times a day as well as tramadol 50 mg 4 times a day  She is now approximately one year post left TKR and 6 months post right TKR  No significant improvement with L3 L4 L5 medial branch blocks Pain Inventory Average Pain 10 Pain Right Now 10 My pain is aching  In the last 24 hours, has pain interfered with the following? General activity 7 Relation with others 7 Enjoyment of life 7 What TIME of day is your pain at its worst? . Sleep (in general) Poor  Pain is worse with: walking, bending, sitting and standing Pain improves with: . Relief from Meds: 0  Mobility use a cane ability to climb steps?  yes do you drive?  yes  Function not employed: date last employed . I need assistance with the following:  bathing, meal prep, household duties and shopping  Neuro/Psych weakness numbness tingling spasms depression anxiety  Prior Studies Any changes since last visit?  no  Physicians involved in your care Any changes since last visit?  no   Family History  Problem Relation Age of Onset  . Cancer Paternal Grandmother     unknown  . Hypertension Mother   . Diabetes Mother   . Hypertension Father   . Diabetes Father   . Colon cancer Neg Hx    Social History   Social History  . Marital Status: Single    Spouse Name: N/A  . Number of Children: N/A  . Years of Education: N/A   Social History Main Topics  . Smoking status: Never Smoker   . Smokeless tobacco: Never Used  . Alcohol Use: No  . Drug Use: No  . Sexual Activity: No   Other Topics Concern  . Not on file   Social History Narrative   Past Surgical History  Procedure Laterality  Date  . Cholecystectomy    . Appendectomy    . Carpal tunnel release      Bilateral  . Tonsillectomy    . Spur      Apex spur on both big toes  . Knee surgery      Left Knee  . Portacath placement  06/18/2011    Procedure: INSERTION PORT-A-CATH;  Surgeon: Edward Jolly, MD;  Location: [place];  Service: General;  Laterality: Right;  right subclavian  . Abdominal hysterectomy      still has ovaries  . Axillary lymph node dissection  11/28/2011    Procedure: AXILLARY LYMPH NODE DISSECTION;  Surgeon: Edward Jolly, MD;  Location: [place];  Service: General;  Laterality: Left;  . Toe surgery Bilateral   . Breast surgery Left 2013  . Eye surgery Left     cataract removal  . Total knee arthroplasty Left 09/13/2014  . Total knee arthroplasty Left 09/13/2014    Procedure: LEFT TOTAL KNEE ARTHROPLASTY;  Surgeon: Rod Can, MD;  Location: [place];  Service: Orthopedics;  Laterality: Left;  . Total knee arthroplasty Right 03/10/2015    Procedure: RIGHT TOTAL KNEE ARTHROPLASTY;  Surgeon: Rod Can, MD;  Location: [facility];  Service: Orthopedics;  Laterality: Right;  . Removal portacath  2014   Past Medical History  Diagnosis Date  . Night sweats   . Fatigue   . Wears glasses   . Arthritis   . Hot flashes   . Blood transfusion   . Diverticulitis of colon   . Headache(784.0)   . Anemia   . Bursitis   . Carpal tunnel syndrome   . Fibromyalgia 08/2012  . SOB (shortness of breath) on exertion     walking and stairs  . Pneumonia     hx of  . Anxiety   . Depression   . GERD (gastroesophageal reflux disease)   . Breast cancer ([place]) 2010    T3N1 invasive ductal carcinoma left breast  . Blood transfusion without reported diagnosis 2004    after hysterectomy   There were no vitals taken for this visit.  Opioid Risk Score:   Fall Risk Score:  `1  Depression screen PHQ 2/9  Depression screen [facility] 2/9 08/23/2015 04/26/2015 02/04/2015  Decreased Interest 2  0 0  Down, Depressed, Hopeless 0 0 0  PHQ - 2 Score 2 0 0  Altered sleeping 1 - -  Tired, decreased energy 3 - -  Change in appetite 3 - -  Feeling bad or failure about yourself  0 - -  Trouble concentrating 3 - -  Moving slowly or fidgety/restless 2 - -  Suicidal thoughts 0 - -  PHQ-9 Score 14 - -  Difficult doing work/chores Very difficult - -     Review of Systems     Objective:   Physical Exam  Constitutional: She is oriented to person, place, and time. She appears well-developed and well-nourished.  Morbid obesity  HENT:  Head: Normocephalic and atraumatic.  Eyes: Conjunctivae and EOM are normal. Pupils are equal, round, and reactive to light.  Neck: Normal range of motion.  Neurological: She is alert and oriented to person, place, and time.  Psychiatric: She has a normal mood and affect.  Nursing note and vitals reviewed.  No pain with knee range of motion. Ambulates with a cane no evidence of toe drag or knee instability. She has tenderness palpation of 18/18 farm as a tender points.  Negative straight leg raising test Normal strength bilateral upper and lower limbs      Assessment & Plan:  1. Fibromyalgia syndrome I think this is her underlying pain problem and is making her other issues seem more painful. Would continue Lyrica 100 mg 3 times a day, Consider increasing to 150 milliu grams 3 times a day next visit if not much better Add Cyclobenzaprine 5 mg daily at bedtime  2. Lumbar spondylosis, once again no significant relief with medial branch blocks as expected. Fibromyalgia and some chronic muscular strain are likely the issue Continue tramadol 50 mg 4 times a day  . 3. Status post bilateral TKR with no postoperative complications.  4. 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 This should not progress she does not have any underlying diabetes. We will continue Lyrica at current dose. If patient has any development  of motor weakness in the lower extremities would consider repeat EMG  Chronic pain syndrome, multifactorial make referral for acupuncture discussed with patient agrees with plan.

## 2015-09-21 DIAGNOSIS — Z853 Personal history of malignant neoplasm of breast: Secondary | ICD-10-CM | POA: Insufficient documentation

## 2015-09-21 DIAGNOSIS — Z923 Personal history of irradiation: Secondary | ICD-10-CM | POA: Insufficient documentation

## 2015-09-30 MED FILL — CYCLOBENZAPRINE 5 MG TABLET: 5 | 30 days supply | Qty: 30 | Fill #0

## 2015-09-30 MED FILL — LYRICA 100 MG CAPSULE: 100 | 30 days supply | Qty: 90 | Fill #1

## 2015-10-03 MED FILL — ANASTROZOLE 1 MG TABLET: 1 | 30 days supply | Qty: 30 | Fill #1

## 2015-10-07 ENCOUNTER — Ambulatory Visit (INDEPENDENT_AMBULATORY_CARE_PROVIDER_SITE_OTHER): Payer: Medicare Other | Admitting: Podiatry

## 2015-10-07 ENCOUNTER — Encounter: Payer: Self-pay | Admitting: Podiatry

## 2015-10-07 DIAGNOSIS — B351 Tinea unguium: Secondary | ICD-10-CM | POA: Diagnosis not present

## 2015-10-07 DIAGNOSIS — M79676 Pain in unspecified toe(s): Secondary | ICD-10-CM | POA: Diagnosis not present

## 2015-10-07 NOTE — Progress Notes (Signed)
Subjective:     Patient ID: Kelsey Wilson, female   DOB: September 08, 1960, 55 y.o.   MRN: [id number]  HPIThis patient returns for nail care both feet.  She admits to having right knee treatment prior to visit.  She states she has pain through bottom of both feet which were treated plantar fascial brace which helped some.  Peripheral neuropathy. She says her nails all fell off during chemo except second toenail right which is thick and painful.   Review of Systems     Objective:   Physical Exam GENERAL APPEARANCE: Alert, conversant. Appropriately groomed. No acute distress.  VASCULAR: Pedal pulses palpable at 2/4 DP and PT bilateral.  Capillary refill time is immediate to all digits,  Proximal to distal cooling it warm to warm.  Digital hair growth is present bilateral  NEUROLOGIC: sensation is intact epicritically and protectively to 5.07 monofilament at 5/5 sites bilateral.  Light touch is intact bilateral, vibratory sensation intact bilateral, achilles tendon reflex is intact bilateral.  MUSCULOSKELETAL: acceptable muscle strength, tone and stability bilateral.  Intrinsic muscluature intact bilateral.  Rectus appearance of foot and digits noted bilateral.   Plantar fascia painful  upon palpation B/L  DERMATOLOGIC: skin color, texture, and turgor are within normal limits.  No preulcerative lesions or ulcers  are seen, no interdigital maceration noted.  No open lesions present.  . No drainage noted. NAILS  Thick disfigured discolored nails both feet x 10      Assessment:     Onychomycosis    Plan:  Debridement of onychomycotic nails.  RTC  3 months.   Gardiner Barefoot DPM

## 2015-10-16 ENCOUNTER — Emergency Department (HOSPITAL_COMMUNITY)
Admission: EM | Admit: 2015-10-16 | Discharge: 2015-10-16 | Disposition: A | Discharge status: Home / Self Care | Payer: Medicare Other | Attending: Emergency Medicine | Admitting: Emergency Medicine

## 2015-10-16 ENCOUNTER — Encounter (HOSPITAL_COMMUNITY): Payer: Self-pay | Admitting: Emergency Medicine

## 2015-10-16 DIAGNOSIS — Z79899 Other long term (current) drug therapy: Secondary | ICD-10-CM | POA: Diagnosis not present

## 2015-10-16 DIAGNOSIS — F329 Major depressive disorder, single episode, unspecified: Secondary | ICD-10-CM | POA: Diagnosis not present

## 2015-10-16 DIAGNOSIS — K625 Hemorrhage of anus and rectum: Secondary | ICD-10-CM | POA: Insufficient documentation

## 2015-10-16 DIAGNOSIS — M797 Fibromyalgia: Secondary | ICD-10-CM | POA: Insufficient documentation

## 2015-10-16 DIAGNOSIS — Z79891 Long term (current) use of opiate analgesic: Secondary | ICD-10-CM | POA: Diagnosis not present

## 2015-10-16 DIAGNOSIS — C50919 Malignant neoplasm of unspecified site of unspecified female breast: Secondary | ICD-10-CM | POA: Diagnosis not present

## 2015-10-16 DIAGNOSIS — K219 Gastro-esophageal reflux disease without esophagitis: Secondary | ICD-10-CM | POA: Diagnosis not present

## 2015-10-16 DIAGNOSIS — M1990 Unspecified osteoarthritis, unspecified site: Secondary | ICD-10-CM | POA: Diagnosis not present

## 2015-10-16 DIAGNOSIS — Z96653 Presence of artificial knee joint, bilateral: Secondary | ICD-10-CM | POA: Diagnosis not present

## 2015-10-16 LAB — COMPREHENSIVE METABOLIC PANEL
ALT: 14 U/L (ref 14–54)
AST: 19 U/L (ref 15–41)
Albumin: 4 g/dL (ref 3.5–5.0)
Alkaline Phosphatase: 65 U/L (ref 38–126)
Anion gap: 10 (ref 5–15)
BUN: 17 mg/dL (ref 6–20)
CO2: 32 mmol/L (ref 22–32)
Calcium: 9.5 mg/dL (ref 8.9–10.3)
Chloride: 102 mmol/L (ref 101–111)
Creatinine, Ser: 0.75 mg/dL (ref 0.44–1.00)
GFR calc Af Amer: >60 mL/min (ref >60)
GFR calc non Af Amer: >60 mL/min (ref >60)
Glucose, Bld: 99 mg/dL (ref 65–99)
Potassium: 3.2 mmol/L — ABNORMAL LOW (ref 3.5–5.1)
Sodium: 144 mmol/L (ref 135–145)
Total Bilirubin: 0.8 mg/dL (ref 0.3–1.2)
Total Protein: 6.9 g/dL (ref 6.5–8.1)

## 2015-10-16 LAB — CBC
HCT: 37 % (ref 36.0–46.0)
Hemoglobin: 12.1 g/dL (ref 12.0–15.0)
MCH: 27.8 pg (ref 26.0–34.0)
MCHC: 32.7 g/dL (ref 30.0–36.0)
MCV: 85.1 fL (ref 78.0–100.0)
Platelets: 189 10*3/uL (ref 150–400)
RBC: 4.35 MIL/uL (ref 3.87–5.11)
RDW: 14 % (ref 11.5–15.5)
WBC: 3.6 10*3/uL — ABNORMAL LOW (ref 4.0–10.5)

## 2015-10-16 LAB — POC OCCULT BLOOD, ED: Fecal Occult Bld: POSITIVE — AB

## 2015-10-16 LAB — TYPE AND SCREEN
ABO/RH(D): O POS
Antibody Screen: NEGATIVE

## 2015-10-16 MED ORDER — DOCUSATE SODIUM 100 MG PO CAPS: 100.0000 mg | ORAL_CAPSULE | Freq: Two times a day (BID) | ORAL | Status: DC

## 2015-10-16 MED ORDER — HYDROCORTISONE 2.5 % RE CREA: TOPICAL_CREAM | RECTAL | Status: DC

## 2015-10-16 NOTE — ED Provider Notes (Signed)
CSN: [id number]     Arrival date & time 10/16/15  1019 History   First MD Initiated Contact with Patient 10/16/15 1117     Chief Complaint  Patient presents with  . Rectal Bleeding   HPI Comments: 55 year old female presents with rectal bleeding for the past 2 days. Past medical history significant for diverticulitis. Past surgical history significant for cholecystectomy, appendectomy, hysterectomy. She had a colonoscopy in March 1 polyp removed with mild diverticulosis but was otherwise normal. She can't recall if she's ever had an endoscopy. She states that it started with small amount of blood but has increased to the point where she has to change her clothes. Denies NSAID use. She has rectal bleeding without association with her bowel movements. Associated rectal pressure. Denies fever, chills, chest pain, shortness of breath, nausea, vomiting, diarrhea, constipation, straining with bowel movements, dysuria.   Patient is a 55 y.o. female presenting with hematochezia.  Rectal Bleeding Associated symptoms: abdominal pain   Associated symptoms: no fever and no vomiting     Past Medical History  Diagnosis Date  . Night sweats   . Fatigue   . Wears glasses   . Arthritis   . Hot flashes   . Blood transfusion   . Diverticulitis of colon   . Headache(784.0)   . Anemia   . Bursitis   . Carpal tunnel syndrome   . Fibromyalgia 08/2012  . SOB (shortness of breath) on exertion     walking and stairs  . Pneumonia     hx of  . Anxiety   . Depression   . GERD (gastroesophageal reflux disease)   . Breast cancer ([place]) 2010    T3N1 invasive ductal carcinoma left breast  . Blood transfusion without reported diagnosis 2004    after hysterectomy   Past Surgical History  Procedure Laterality Date  . Cholecystectomy    . Appendectomy    . Carpal tunnel release      Bilateral  . Tonsillectomy    . Spur      Apex spur on both big toes  . Knee surgery      Left Knee  . Portacath placement   06/18/2011    Procedure: INSERTION PORT-A-CATH;  Surgeon: Edward Jolly, MD;  Location: [place];  Service: General;  Laterality: Right;  right subclavian  . Abdominal hysterectomy      still has ovaries  . Axillary lymph node dissection  11/28/2011    Procedure: AXILLARY LYMPH NODE DISSECTION;  Surgeon: Edward Jolly, MD;  Location: [place];  Service: General;  Laterality: Left;  . Toe surgery Bilateral   . Breast surgery Left 2013  . Eye surgery Left     cataract removal  . Total knee arthroplasty Left 09/13/2014  . Total knee arthroplasty Left 09/13/2014    Procedure: LEFT TOTAL KNEE ARTHROPLASTY;  Surgeon: Rod Can, MD;  Location: [place];  Service: Orthopedics;  Laterality: Left;  . Total knee arthroplasty Right 03/10/2015    Procedure: RIGHT TOTAL KNEE ARTHROPLASTY;  Surgeon: Rod Can, MD;  Location: [facility];  Service: Orthopedics;  Laterality: Right;  . Removal portacath  2014   Family History  Problem Relation Age of Onset  . Cancer Paternal Grandmother     unknown  . Hypertension Mother   . Diabetes Mother   . Hypertension Father   . Diabetes Father   . Colon cancer Neg Hx    Social History  Substance Use Topics  . Smoking  status: Never Smoker   . Smokeless tobacco: Never Used  . Alcohol Use: No   OB History    No data available     Review of Systems  Constitutional: Negative for fever and chills.  Respiratory: Negative for shortness of breath.   Cardiovascular: Negative for chest pain.  Gastrointestinal: Positive for abdominal pain, blood in stool, hematochezia, anal bleeding and rectal pain. Negative for nausea, vomiting, diarrhea and constipation.  Genitourinary: Negative for dysuria and flank pain.      Allergies  Compazine; Aleve; Oxycodone; Penicillins; and Prochlorperazine  Home Medications   Prior to Admission medications   Medication Sig Start Date End Date Taking? Authorizing Provider  ALPRAZolam Duanne Moron) 0.5 MG  tablet Take 0.5 mg by mouth 2 (two) times daily.     Historical Provider, MD  anastrozole (ARIMIDEX) 1 MG tablet Take 1 tablet (1 mg total) by mouth daily. 06/15/15   Laurie Panda, NP  clotrimazole-betamethasone (LOTRISONE) cream apply to affected area twice a day 08/01/15   Historical Provider, MD  cyclobenzaprine (FLEXERIL) 5 MG tablet Take 1 tablet (5 mg total) by mouth at bedtime. 09/20/15   Charlett Blake, MD  docusate sodium (COLACE) 100 MG capsule Take 100 mg by mouth daily.     Historical Provider, MD  montelukast (SINGULAIR) 10 MG tablet Take 10 mg by mouth at bedtime.    Historical Provider, MD  omeprazole (PRILOSEC) 20 MG capsule Take 20 mg by mouth daily.  05/25/14   Historical Provider, MD  ondansetron (ZOFRAN) 4 MG tablet Take 1 tablet (4 mg total) by mouth every 6 (six) hours as needed for nausea. 03/12/15   Rod Can, MD  pregabalin (LYRICA) 100 MG capsule Take 1 capsule (100 mg total) by mouth 3 (three) times daily. 09/05/15   Charlett Blake, MD  traMADol (ULTRAM) 50 MG tablet Take 1 tablet (50 mg total) by mouth every 6 (six) hours as needed. 07/29/15   Charlett Blake, MD  triamterene-hydrochlorothiazide (DYAZIDE) 37.5-25 MG per capsule Take 1 capsule by mouth daily.    Historical Provider, MD   BP 117/64 mmHg  Pulse 79  Temp(Src) 98.2 F (36.8 C) (Oral)  Resp 18  SpO2 99%   Physical Exam  Constitutional: She is oriented to person, place, and time. She appears well-developed and well-nourished. No distress.  HENT:  Head: Normocephalic and atraumatic.  Eyes: Conjunctivae are normal. Pupils are equal, round, and reactive to light. Right eye exhibits no discharge. Left eye exhibits no discharge. No scleral icterus.  Neck: Normal range of motion.  Cardiovascular: Normal rate and regular rhythm.  Exam reveals no gallop and no friction rub.   No murmur heard. Pulmonary/Chest: Effort normal and breath sounds normal. No respiratory distress. She has no wheezes. She  has no rales. She exhibits no tenderness.  Abdominal: Soft. Bowel sounds are normal. She exhibits no distension and no mass. There is tenderness. There is no rebound and no guarding.  Epigastric and LLQ tenderness  Genitourinary: Guaiac positive stool.  Rectal exam: bright red blood noted on external anus with small external hemorrhoid. Rectum is exquisitely tender. Digital exam performed which was very painful.  Neurological: She is alert and oriented to person, place, and time.  Skin: Skin is warm and dry.  Psychiatric: She has a normal mood and affect.  Vitals reviewed.   ED Course  Procedures (including critical care time) Labs Review Labs Reviewed  COMPREHENSIVE METABOLIC PANEL - Abnormal; Notable for the following:  Potassium 3.2 (*)    All other components within normal limits  CBC - Abnormal; Notable for the following:    WBC 3.6 (*)    All other components within normal limits  POC OCCULT BLOOD, ED - Abnormal; Notable for the following:    Fecal Occult Bld POSITIVE (*)    All other components within normal limits  TYPE AND SCREEN    Imaging Review No results found. I have personally reviewed and evaluated these images and lab results as part of my medical decision-making.   EKG Interpretation None      MDM   Final diagnoses:  Rectal bleeding   55 year old female who presents with rectal bleeding. Vital signs are WNL and patient is normotensive. PE reveals minimally tender abdomen. Rectal exam reveals BRBPR and visible small engorged hemorrhoid which is very tender to palpation. Hemoocult positive. CBC is remarkable for low WBC count which patient states is her baseline since she had chemo from breast cancer. CMP is remarkable for mildly low K. Do not suspect diverticulitis at this time. Patient had a recent colonoscopy in March and is afebrile with low WBC. Rx given for Anusol and Colace. Patient is NAD, non-toxic, with stable VS. Patient is informed of clinical  course, understands medical decision making process, and agrees with plan. Opportunity for questions provided and all questions answered. Return precautions given  Recardo Evangelist, PA-C 10/16/15 [place], MD 10/16/15 1539

## 2015-10-16 NOTE — ED Notes (Signed)
Pt want IV/labs done at one time.

## 2015-10-16 NOTE — ED Notes (Signed)
Per pt, states rectal bleeding for 3 days-states no abdominal pain-history of diverticulitis

## 2015-10-24 ENCOUNTER — Other Ambulatory Visit: Payer: Self-pay | Admitting: Specialist

## 2015-10-24 DIAGNOSIS — M5136 Other intervertebral disc degeneration, lumbar region: Secondary | ICD-10-CM

## 2015-11-01 ENCOUNTER — Ambulatory Visit
Admission: RE | Admit: 2015-11-01 | Discharge: 2015-11-01 | Disposition: A | Discharge status: Home / Self Care | Payer: Medicare Other | Source: Ambulatory Visit | Attending: Specialist | Admitting: Specialist

## 2015-11-01 ENCOUNTER — Encounter: Payer: Self-pay | Admitting: Radiology

## 2015-11-01 ENCOUNTER — Ambulatory Visit
Admission: RE | Admit: 2015-11-01 | Discharge: 2015-11-01 | Disposition: A | Discharge status: Home / Self Care | Payer: Self-pay | Source: Ambulatory Visit | Attending: Specialist | Admitting: Specialist

## 2015-11-01 ENCOUNTER — Other Ambulatory Visit: Payer: Self-pay | Admitting: Specialist

## 2015-11-01 VITALS — BP 108/77 | HR 72

## 2015-11-01 DIAGNOSIS — IMO0002 Reserved for concepts with insufficient information to code with codable children: Secondary | ICD-10-CM

## 2015-11-01 DIAGNOSIS — M47817 Spondylosis without myelopathy or radiculopathy, lumbosacral region: Secondary | ICD-10-CM

## 2015-11-01 DIAGNOSIS — M5137 Other intervertebral disc degeneration, lumbosacral region: Secondary | ICD-10-CM

## 2015-11-01 DIAGNOSIS — M5136 Other intervertebral disc degeneration, lumbar region: Secondary | ICD-10-CM

## 2015-11-01 IMAGING — CR DG MYELOGRAPHY LUMBAR INJ LUMBOSACRAL
14 of 21 series · 14 of 21 positions shown · non-contrast
Comparison: Lumbar spine MRI 07/23/2015 from [REDACTED]

CLINICAL DATA: Low back pain and anterolateral lower extremity pain
bilaterally.
TECHNIQUE: Contiguous axial images were obtained through the Lumbar spine after
the intrathecal infusion of infusion. Coronal and sagittal
reconstructions were obtained of the axial image sets.

[w lumbar spine ap (1 of 4)]
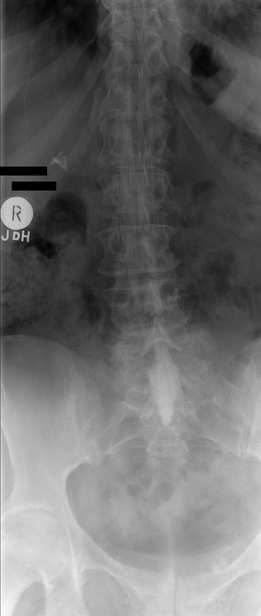

[w lumbar spine ap (2 of 4)]
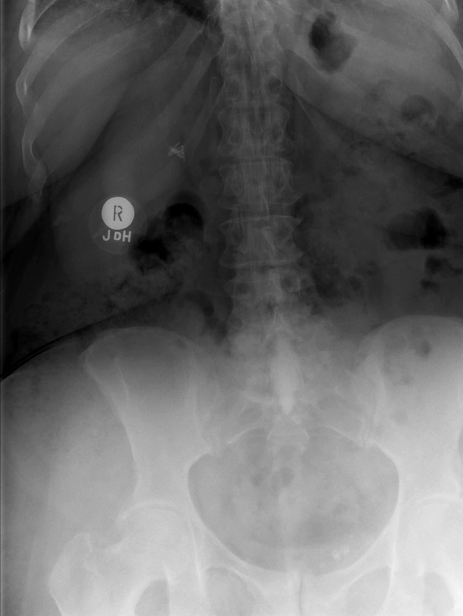

[vasc adipose (1 of 7)]
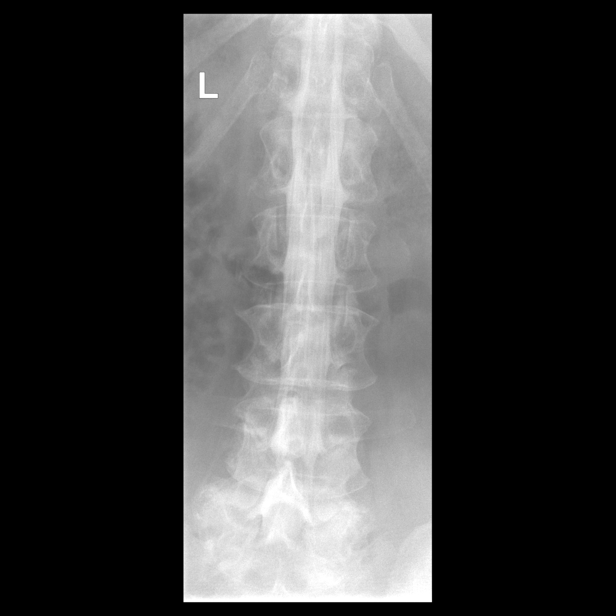

[w lumbar spine ap (3 of 4)]
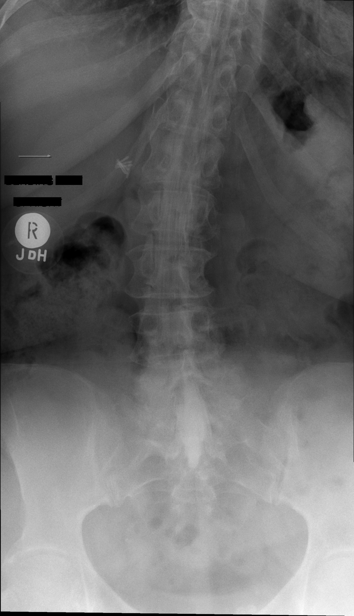

[w lumbar spine ap (4 of 4)]
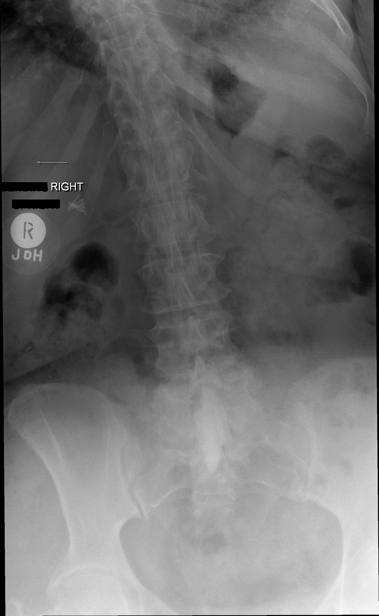

[vasc adipose (2 of 7)]
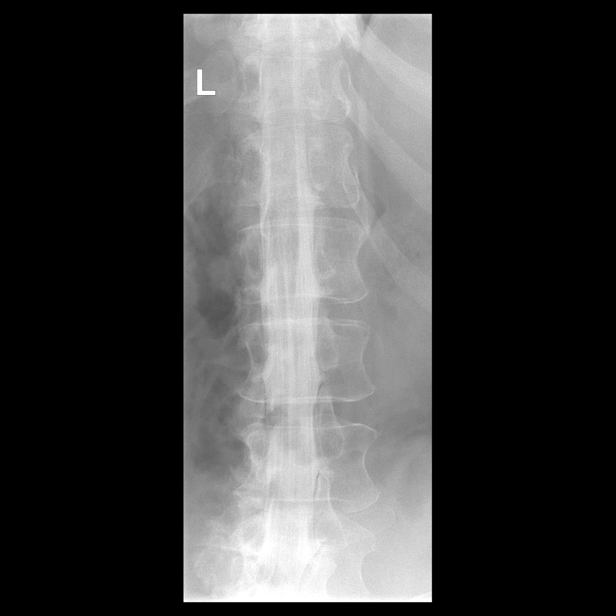

[w lumbar spine lat]
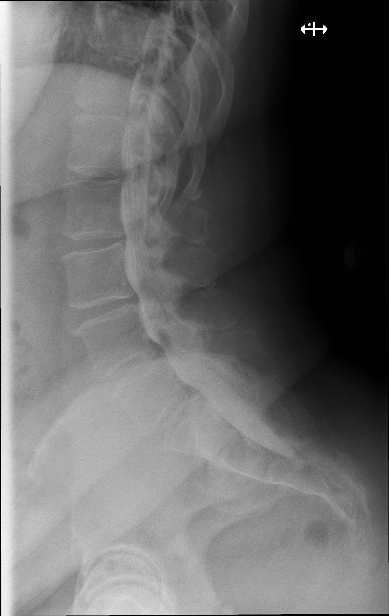

[w lumbar spine flexion]
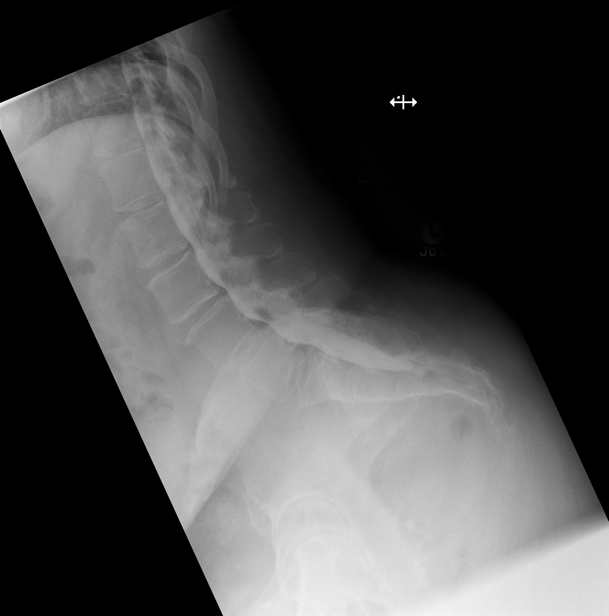

[vasc adipose (3 of 7)]
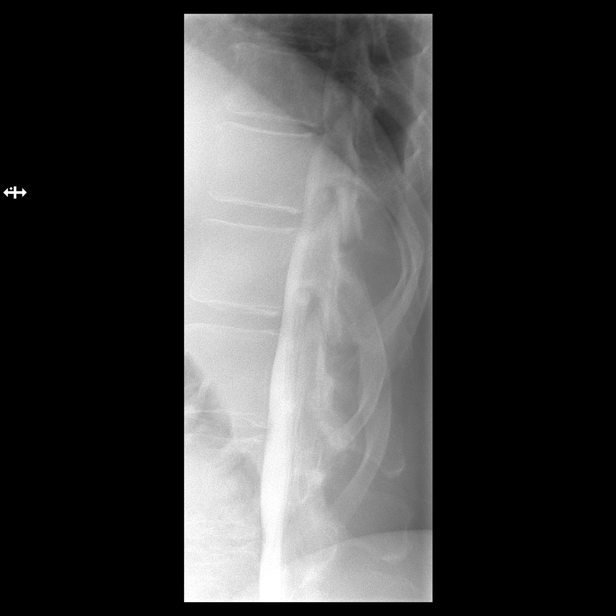

[vasc adipose (4 of 7)]
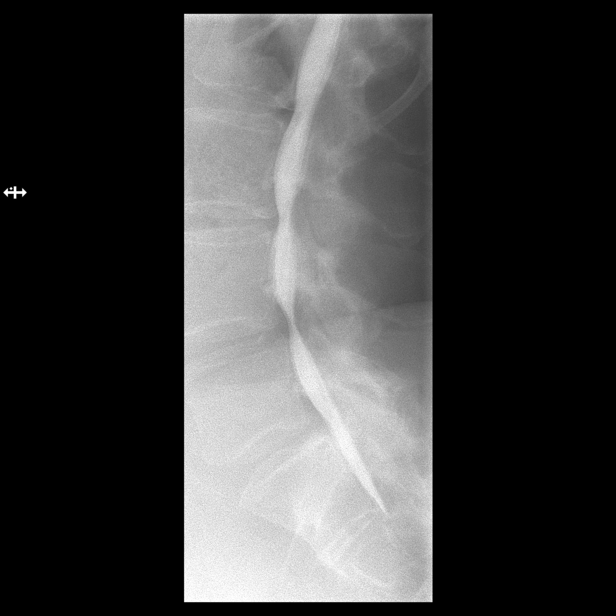

[vasc adipose (5 of 7)]
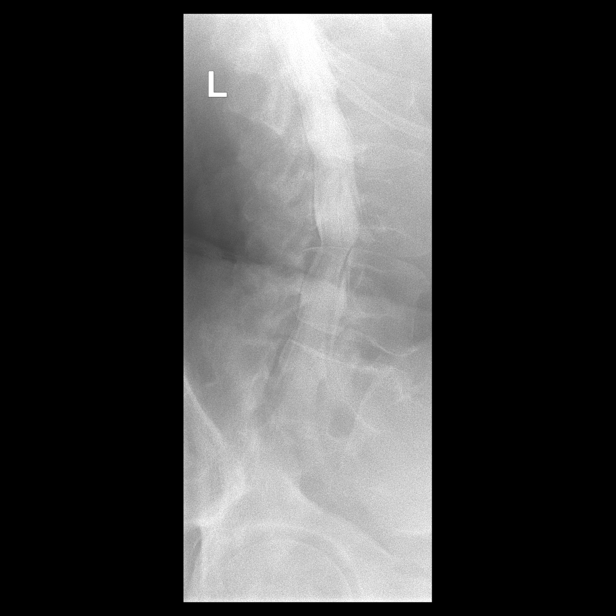

[vasc adipose (6 of 7)]
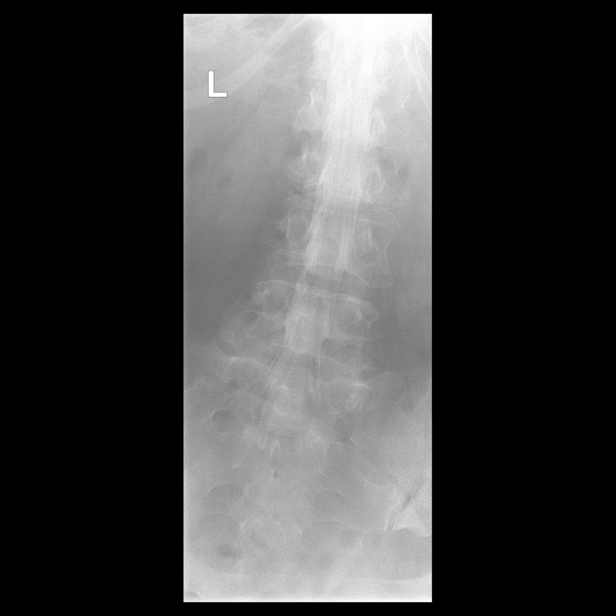

[vasc adipose (7 of 7)]
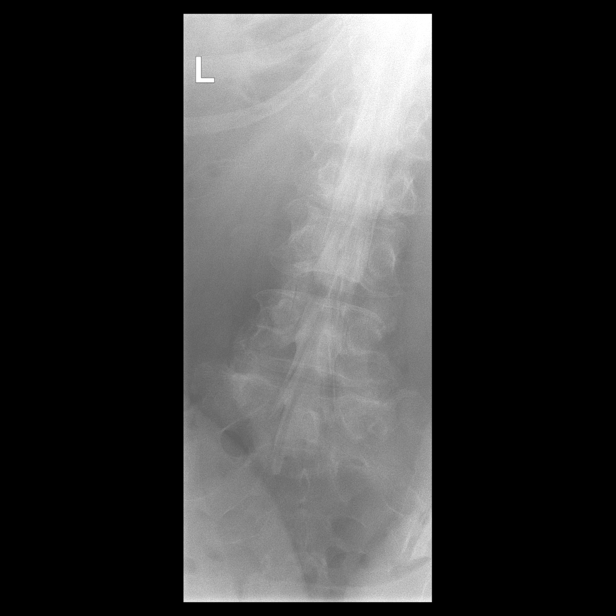

[vasc standard]
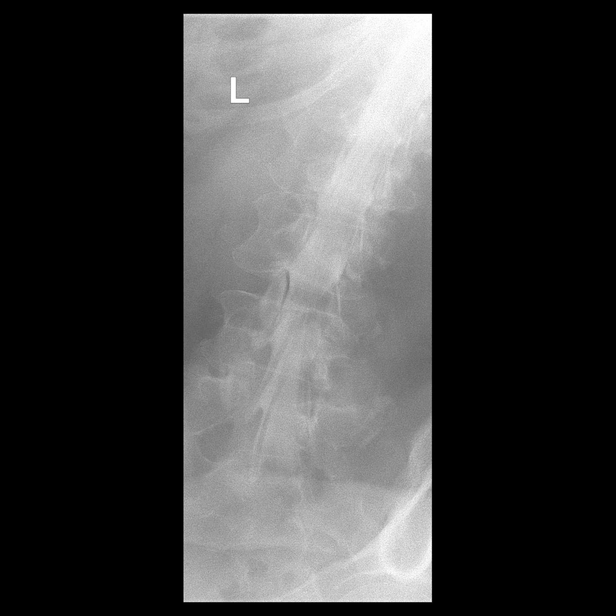

[14 of 21 positions shown; findings below may reference images not displayed]

EXAM:
LUMBAR MYELOGRAM

FLUOROSCOPY TIME:  Radiation Exposure Index (as provided by the
fluoroscopic device): 403.82 microGray*m^2

Fluoroscopy Time (in minutes and seconds):  48 seconds

PROCEDURE:
After thorough discussion of risks and benefits of the procedure
including bleeding, infection, injury to nerves, blood vessels,
adjacent structures as well as headache and CSF leak, written and
oral informed consent was obtained. Consent was obtained by Dr.
Omar Marcelo Vaillant. Time out form was completed.

Patient was positioned prone on the fluoroscopy table. Local
anesthesia was provided with 1% lidocaine without epinephrine after
prepped and draped in the usual sterile fashion. Puncture was
performed at L2-3 using a 5 inch 22-gauge spinal needle via a right
interlaminar approach. Using a single pass through the dura, the
needle was placed within the thecal sac, with return of clear CSF.
15 mL of Isovue M 200 was injected into the thecal sac, with normal
opacification of the nerve roots and cauda equina consistent with
free flow within the subarachnoid space.

I personally performed the lumbar puncture and administered the
intrathecal contrast. I also personally supervised acquisition of
the myelogram images.
FINDINGS: LUMBAR MYELOGRAM FINDINGS:

There are hypoplastic ribs bilaterally at L1. Mild lumbar
dextroscoliosis is noted with apex at L3. There is trace
anterolisthesis of L4 on L5 without significant change during
flexion or extension.

Ventral extradural defects are present at L4-5 greater than L3-4
with mild-to-moderate spinal canal narrowing at L4-5 and mild
narrowing at L3-4 as well as mild left lateral recess narrowing at
L4-5. Narrowing does not significantly worsen with upright
positioning or lateral bending. A small ventral extradural defect is
also noted at T10-11 corresponding to disc bulging on the prior MRI
without evidence of significant mass effect on the distal cord.

CT LUMBAR MYELOGRAM FINDINGS:

Mild lumbar dextroscoliosis is again noted with apex at L3. Trace
anterolisthesis of L4 on L5 measures 2 mm. Vertebral body heights
are preserved. Intervertebral disc space heights are preserved
overall.

Conus medullaris terminates at L1-2. Paraspinal soft tissues are
unremarkable.

L1-2:  Minimal facet spurring without disc herniation or stenosis.

L2-3: Mild facet arthrosis and minimal disc bulging without
stenosis, unchanged.

L3-4: Mild disc bulging, mild ligamentum flavum thickening, and mild
facet hypertrophy result in borderline to mild spinal stenosis and
minimal bilateral neural foraminal narrowing, unchanged.

L4-5: Mild circumferential disc bulging, ligamentum flavum
thickening, and moderate right and severe left facet hypertrophy
result in moderate spinal stenosis, mild-to-moderate left lateral
recess stenosis, and mild right and moderate left neural foraminal
stenosis, unchanged. The left L5 nerve root could be affected in the
lateral recess.

L5-S1: Advanced bilateral facet arthrosis without disc herniation or
stenosis, unchanged.
IMPRESSION: 1. Moderate multifactorial spinal stenosis at L4-5 with left lateral
recess and moderate left neural foraminal stenosis.
2. Borderline to mild spinal stenosis at L3-4.
3. Advanced L5-S1 facet arthrosis without stenosis.

## 2015-11-01 IMAGING — CT CT L SPINE W/ CM
1 of 6 series · 5 of 14 positions shown, 7 images · non-contrast
Comparison: Lumbar spine MRI 07/23/2015 from [REDACTED]

CLINICAL DATA: Low back pain and anterolateral lower extremity pain
bilaterally.
TECHNIQUE: Contiguous axial images were obtained through the Lumbar spine after
the intrathecal infusion of infusion. Coronal and sagittal
reconstructions were obtained of the axial image sets.

[Series 3: l spine soft · axial · 0.27mm/px · z∈[-320,-179]mm · 5 of 71 slices shown, 7 images]
[im 12/71  soft-tissue]
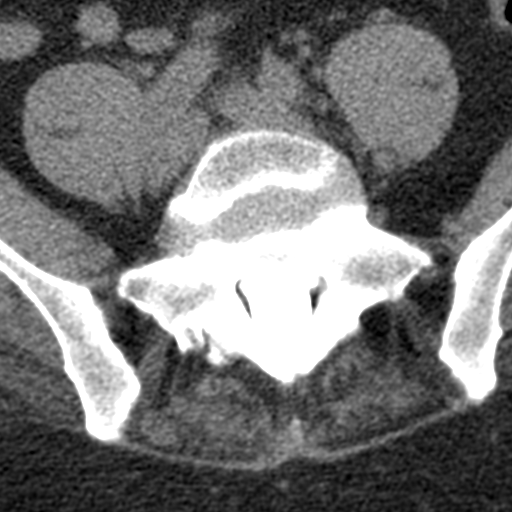
[im 12/71  bone]
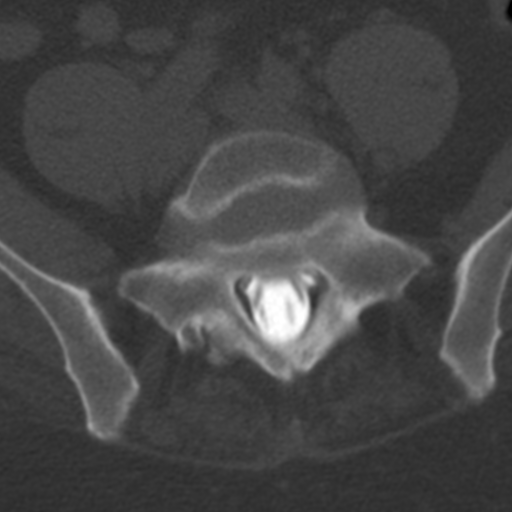
[im 24/71  bone]
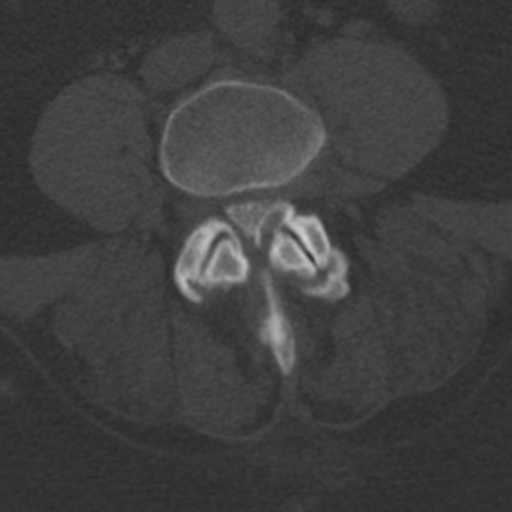
[im 36/71  bone]
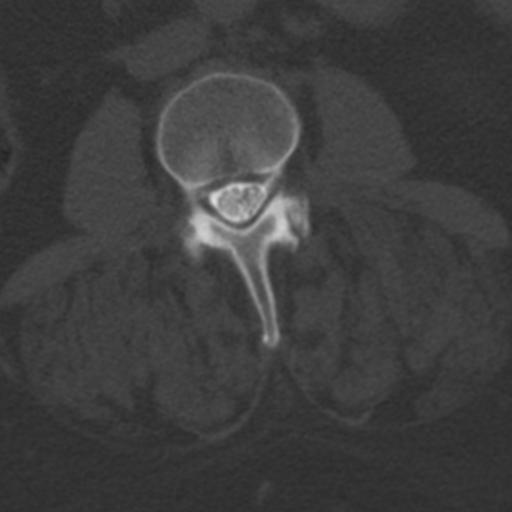
[im 47/71  bone]
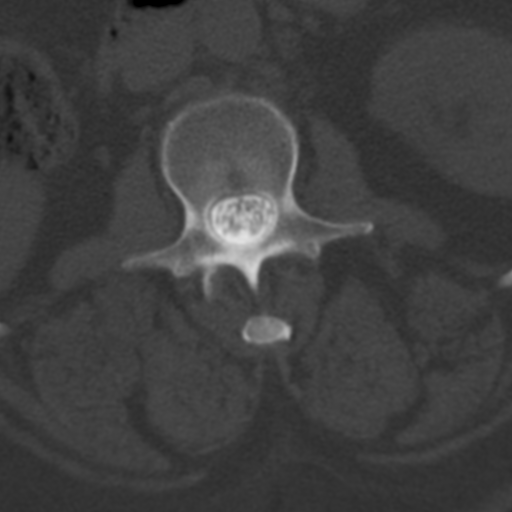
[im 59/71  soft-tissue]
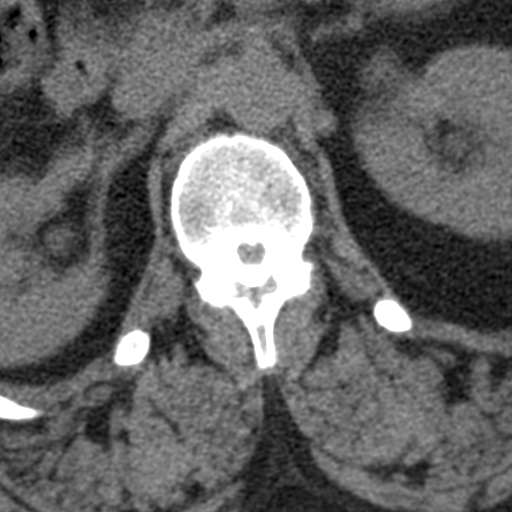
[im 59/71  bone]
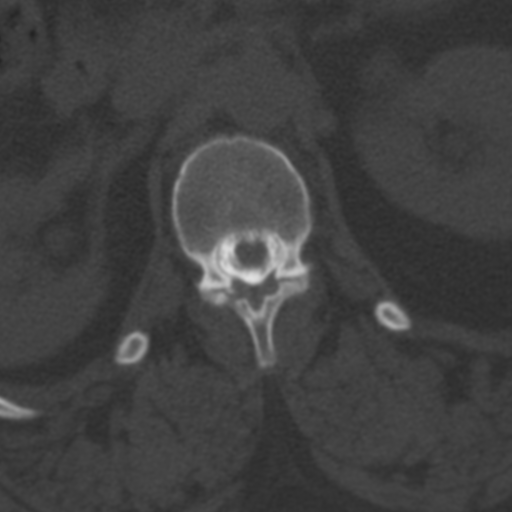

[5 of 14 positions shown; findings below may reference images not displayed]

EXAM:
LUMBAR MYELOGRAM

FLUOROSCOPY TIME:  Radiation Exposure Index (as provided by the
fluoroscopic device): 403.82 microGray*m^2

Fluoroscopy Time (in minutes and seconds):  48 seconds

PROCEDURE:
After thorough discussion of risks and benefits of the procedure
including bleeding, infection, injury to nerves, blood vessels,
adjacent structures as well as headache and CSF leak, written and
oral informed consent was obtained. Consent was obtained by Dr.
Omar Marcelo Vaillant. Time out form was completed.

Patient was positioned prone on the fluoroscopy table. Local
anesthesia was provided with 1% lidocaine without epinephrine after
prepped and draped in the usual sterile fashion. Puncture was
performed at L2-3 using a 5 inch 22-gauge spinal needle via a right
interlaminar approach. Using a single pass through the dura, the
needle was placed within the thecal sac, with return of clear CSF.
15 mL of Isovue M 200 was injected into the thecal sac, with normal
opacification of the nerve roots and cauda equina consistent with
free flow within the subarachnoid space.

I personally performed the lumbar puncture and administered the
intrathecal contrast. I also personally supervised acquisition of
the myelogram images.
FINDINGS: LUMBAR MYELOGRAM FINDINGS:

There are hypoplastic ribs bilaterally at L1. Mild lumbar
dextroscoliosis is noted with apex at L3. There is trace
anterolisthesis of L4 on L5 without significant change during
flexion or extension.

Ventral extradural defects are present at L4-5 greater than L3-4
with mild-to-moderate spinal canal narrowing at L4-5 and mild
narrowing at L3-4 as well as mild left lateral recess narrowing at
L4-5. Narrowing does not significantly worsen with upright
positioning or lateral bending. A small ventral extradural defect is
also noted at T10-11 corresponding to disc bulging on the prior MRI
without evidence of significant mass effect on the distal cord.

CT LUMBAR MYELOGRAM FINDINGS:

Mild lumbar dextroscoliosis is again noted with apex at L3. Trace
anterolisthesis of L4 on L5 measures 2 mm. Vertebral body heights
are preserved. Intervertebral disc space heights are preserved
overall.

Conus medullaris terminates at L1-2. Paraspinal soft tissues are
unremarkable.

L1-2:  Minimal facet spurring without disc herniation or stenosis.

L2-3: Mild facet arthrosis and minimal disc bulging without
stenosis, unchanged.

L3-4: Mild disc bulging, mild ligamentum flavum thickening, and mild
facet hypertrophy result in borderline to mild spinal stenosis and
minimal bilateral neural foraminal narrowing, unchanged.

L4-5: Mild circumferential disc bulging, ligamentum flavum
thickening, and moderate right and severe left facet hypertrophy
result in moderate spinal stenosis, mild-to-moderate left lateral
recess stenosis, and mild right and moderate left neural foraminal
stenosis, unchanged. The left L5 nerve root could be affected in the
lateral recess.

L5-S1: Advanced bilateral facet arthrosis without disc herniation or
stenosis, unchanged.
IMPRESSION: 1. Moderate multifactorial spinal stenosis at L4-5 with left lateral
recess and moderate left neural foraminal stenosis.
2. Borderline to mild spinal stenosis at L3-4.
3. Advanced L5-S1 facet arthrosis without stenosis.

## 2015-11-01 MED ORDER — IOPAMIDOL (ISOVUE-M 200) INJECTION 41%
15.0000 mL | Freq: Once | INTRAMUSCULAR | Status: AC
  Administered 2015-11-01: 15 mL via INTRATHECAL

## 2015-11-01 MED ORDER — DIAZEPAM 5 MG PO TABS
10.0000 mg | ORAL_TABLET | Freq: Once | ORAL | Status: AC
  Administered 2015-11-01: 10 mg via ORAL

## 2015-11-01 NOTE — Discharge Instructions (Signed)
Myelogram Discharge Instructions  1. Go home and rest quietly for the next 24 hours.  It is important to lie flat for the next 24 hours.  Get up only to go to the restroom.  You may lie in the bed or on a couch on your back, your stomach, your left side or your right side.  You may have one pillow under your head.  You may have pillows between your knees while you are on your side or under your knees while you are on your back.  2. DO NOT drive today.  Recline the seat as far back as it will go, while still wearing your seat belt, on the way home.  3. You may get up to go to the bathroom as needed.  You may sit up for 10 minutes to eat.  You may resume your normal diet and medications unless otherwise indicated.  Drink lots of extra fluids today and tomorrow.  4. The incidence of headache, nausea, or vomiting is about 5% (one in 20 patients).  If you develop a headache, lie flat and drink plenty of fluids until the headache goes away.  Caffeinated beverages may be helpful.  If you develop severe nausea and vomiting or a headache that does not go away with flat bed rest, call [phone].  5. You may resume normal activities after your 24 hours of bed rest is over; however, do not exert yourself strongly or do any heavy lifting tomorrow. If when you get up you have a headache when standing, go back to bed and force fluids for another 24 hours.  6. Call your physician for a follow-up appointment.  The results of your myelogram will be sent directly to your physician by the following day.  7. If you have any questions or if complications develop after you arrive home, please call [phone].  Discharge instructions have been explained to the patient.  The patient, or the person responsible for the patient, fully understands these instructions.       May resume Phentermine, Lexapro and Tramadol on Nov 02, 2015, after 11:00 am.

## 2015-11-01 NOTE — Progress Notes (Signed)
Patient states she has been off Lexapro, Phentermine and Tramadol for at least the past two days.

## 2015-11-03 MED FILL — ANASTROZOLE 1 MG TABLET: 1 | 30 days supply | Qty: 30 | Fill #2

## 2015-11-03 MED FILL — traMADol HCL 50 MG TABS: 50 | 30 days supply | Qty: 120 | Fill #2

## 2015-11-25 MED FILL — CYCLOBENZAPRINE 5 MG TABLET: 5 | 30 days supply | Qty: 30 | Fill #1

## 2015-12-01 MED FILL — ANASTROZOLE 1 MG TABLET: 1 | 30 days supply | Qty: 30 | Fill #3

## 2015-12-01 MED FILL — LYRICA 100 MG CAPSULE: 100 | 30 days supply | Qty: 90 | Fill #2

## 2015-12-20 ENCOUNTER — Encounter: Payer: Medicare Other | Admitting: Registered Nurse

## 2015-12-23 ENCOUNTER — Encounter: Payer: Self-pay | Admitting: Registered Nurse

## 2015-12-23 ENCOUNTER — Encounter: Payer: Medicare Other | Attending: Registered Nurse | Admitting: Registered Nurse

## 2015-12-23 VITALS — BP 126/84 | HR 67 | Resp 17

## 2015-12-23 DIAGNOSIS — Z9221 Personal history of antineoplastic chemotherapy: Secondary | ICD-10-CM | POA: Diagnosis not present

## 2015-12-23 DIAGNOSIS — F419 Anxiety disorder, unspecified: Secondary | ICD-10-CM | POA: Insufficient documentation

## 2015-12-23 DIAGNOSIS — R531 Weakness: Secondary | ICD-10-CM | POA: Diagnosis not present

## 2015-12-23 DIAGNOSIS — M1711 Unilateral primary osteoarthritis, right knee: Secondary | ICD-10-CM

## 2015-12-23 DIAGNOSIS — M797 Fibromyalgia: Secondary | ICD-10-CM | POA: Diagnosis present

## 2015-12-23 DIAGNOSIS — M47817 Spondylosis without myelopathy or radiculopathy, lumbosacral region: Secondary | ICD-10-CM | POA: Diagnosis not present

## 2015-12-23 DIAGNOSIS — M1712 Unilateral primary osteoarthritis, left knee: Secondary | ICD-10-CM

## 2015-12-23 DIAGNOSIS — G5600 Carpal tunnel syndrome, unspecified upper limb: Secondary | ICD-10-CM | POA: Insufficient documentation

## 2015-12-23 DIAGNOSIS — Z853 Personal history of malignant neoplasm of breast: Secondary | ICD-10-CM | POA: Insufficient documentation

## 2015-12-23 DIAGNOSIS — K5732 Diverticulitis of large intestine without perforation or abscess without bleeding: Secondary | ICD-10-CM | POA: Diagnosis not present

## 2015-12-23 DIAGNOSIS — Z96653 Presence of artificial knee joint, bilateral: Secondary | ICD-10-CM | POA: Insufficient documentation

## 2015-12-23 DIAGNOSIS — G894 Chronic pain syndrome: Secondary | ICD-10-CM | POA: Diagnosis not present

## 2015-12-23 DIAGNOSIS — M62838 Other muscle spasm: Secondary | ICD-10-CM | POA: Insufficient documentation

## 2015-12-23 DIAGNOSIS — F329 Major depressive disorder, single episode, unspecified: Secondary | ICD-10-CM | POA: Diagnosis not present

## 2015-12-23 DIAGNOSIS — K219 Gastro-esophageal reflux disease without esophagitis: Secondary | ICD-10-CM | POA: Insufficient documentation

## 2015-12-23 DIAGNOSIS — M47816 Spondylosis without myelopathy or radiculopathy, lumbar region: Secondary | ICD-10-CM | POA: Diagnosis present

## 2015-12-23 DIAGNOSIS — G629 Polyneuropathy, unspecified: Secondary | ICD-10-CM | POA: Diagnosis not present

## 2015-12-23 MED ORDER — CYCLOBENZAPRINE HCL 10 MG PO TABS: 10.0000 mg | ORAL_TABLET | Freq: Every evening | ORAL | Status: DC | PRN

## 2015-12-23 MED ORDER — TRAMADOL HCL 50 MG PO TABS: 50.0000 mg | ORAL_TABLET | Freq: Four times a day (QID) | ORAL | Status: DC | PRN

## 2015-12-23 MED FILL — CYCLOBENZAPRINE 10 MG TAB: 10 | 30 days supply | Qty: 30 | Fill #0

## 2015-12-23 NOTE — Progress Notes (Signed)
Subjective:    Patient ID: Kelsey Wilson, female    DOB: 02/07/1961, 55 y.o.   MRN: [id number]  HPI: [name] is a 55 year old female who returns for follow up appointment and medication refill. She states her pain is located in her lower back radiating into her bilateral lower extremities anteriorly and posteriorly. She rates her pain 10. Her current exercise regime is walking.   Pain Inventory Average Pain 10 Pain Right Now 10 My pain is sharp, tingling and aching  In the last 24 hours, has pain interfered with the following? General activity 7 Relation with others 7 Enjoyment of life 7 What TIME of day is your pain at its worst? morning, daytime, evening, night Sleep (in general) Fair  Pain is worse with: walking, bending, sitting and standing Pain improves with: other Relief from Meds: 0  Mobility use a cane ability to climb steps?  yes do you drive?  yes  Function disabled: date disabled NA I need assistance with the following:  meal prep and household duties  Neuro/Psych weakness numbness tingling spasms confusion depression anxiety  Prior Studies Any changes since last visit?  yes CT/MRI nerve study  Physicians involved in your care Any changes since last visit?  yes   Family History  Problem Relation Age of Onset  . Cancer Paternal Grandmother     unknown  . Hypertension Mother   . Diabetes Mother   . Hypertension Father   . Diabetes Father   . Colon cancer Neg Hx    Social History   Social History  . Marital Status: Single    Spouse Name: N/A  . Number of Children: N/A  . Years of Education: N/A   Social History Main Topics  . Smoking status: Never Smoker   . Smokeless tobacco: Never Used  . Alcohol Use: No  . Drug Use: No  . Sexual Activity: No   Other Topics Concern  . None   Social History Narrative   Past Surgical History  Procedure Laterality Date  . Cholecystectomy    . Appendectomy    . Carpal tunnel  release      Bilateral  . Tonsillectomy    . Spur      Apex spur on both big toes  . Knee surgery      Left Knee  . Portacath placement  06/18/2011    Procedure: INSERTION PORT-A-CATH;  Surgeon: Edward Jolly, MD;  Location: [facility];  Service: General;  Laterality: Right;  right subclavian  . Abdominal hysterectomy      still has ovaries  . Axillary lymph node dissection  11/28/2011    Procedure: AXILLARY LYMPH NODE DISSECTION;  Surgeon: Edward Jolly, MD;  Location: [facility];  Service: General;  Laterality: Left;  . Toe surgery Bilateral   . Breast surgery Left 2013  . Eye surgery Left     cataract removal  . Total knee arthroplasty Left 09/13/2014  . Total knee arthroplasty Left 09/13/2014    Procedure: LEFT TOTAL KNEE ARTHROPLASTY;  Surgeon: Rod Can, MD;  Location: [facility];  Service: Orthopedics;  Laterality: Left;  . Total knee arthroplasty Right 03/10/2015    Procedure: RIGHT TOTAL KNEE ARTHROPLASTY;  Surgeon: Rod Can, MD;  Location: [facility];  Service: Orthopedics;  Laterality: Right;  . Removal portacath  2014   Past Medical History  Diagnosis Date  . Night sweats   . Fatigue   . Wears glasses   .  Arthritis   . Hot flashes   . Blood transfusion   . Diverticulitis of colon   . Headache(784.0)   . Anemia   . Bursitis   . Carpal tunnel syndrome   . Fibromyalgia 08/2012  . SOB (shortness of breath) on exertion     walking and stairs  . Pneumonia     hx of  . Anxiety   . Depression   . GERD (gastroesophageal reflux disease)   . Breast cancer (Kelsey Wilson) 2010    T3N1 invasive ductal carcinoma left breast  . Blood transfusion without reported diagnosis 2004    after hysterectomy   BP 126/84 mmHg  Pulse 67  Resp 17  SpO2 96%  Opioid Risk Score:   Fall Risk Score:  `1  Depression screen PHQ 2/9  Depression screen [facility] 2/9 08/23/2015 04/26/2015 02/04/2015  Decreased Interest 2 0 0  Down, Depressed, Hopeless 0 0 0  PHQ - 2 Score 2 0 0    Altered sleeping 1 - -  Tired, decreased energy 3 - -  Change in appetite 3 - -  Feeling bad or failure about yourself  0 - -  Trouble concentrating 3 - -  Moving slowly or fidgety/restless 2 - -  Suicidal thoughts 0 - -  PHQ-9 Score 14 - -  Difficult doing work/chores Very difficult - -     Review of Systems  Constitutional: Positive for diaphoresis and unexpected weight change.  Cardiovascular:       Limb swelling   Neurological: Positive for weakness and numbness.       Tingling  Spasms   Psychiatric/Behavioral: Positive for confusion and dysphoric mood. The patient is nervous/anxious.   All other systems reviewed and are negative.      Objective:   Physical Exam  Constitutional: She is oriented to person, place, and time. She appears well-developed and well-nourished.  HENT:  Head: Normocephalic and atraumatic.  Neck: Normal range of motion. Neck supple.  Cardiovascular: Normal rate and regular rhythm.   Pulmonary/Chest: Effort normal and breath sounds normal.  Musculoskeletal:  Normal Muscle Bulk and Muscle Testing Reveals: Upper Extremities: Full ROM and Muscle Strength 4/5 Thoracic and Lumbar Hypersensitivity Lower Extremities: Full ROM and Muscle Strength 5/5 Bilateral Lower Extremities Flexion Produces Pain into Patella's Arises from Table with ease Narrow Based Gait   Neurological: She is alert and oriented to person, place, and time.  Skin: Skin is warm and dry.  Psychiatric: She has a normal mood and affect.  Nursing note and vitals reviewed.         Assessment & Plan:  1. Fibromyalgia syndrome: Continue Lyrica 100 mg 3 times 2. Lumbar spondylosis: Refilled: Tramadol 50 mg one tablet every 6 hours as needed for pain #120. We will continue the opioid monitoring program, this consists of regular clinic visits, examinations, urine drug screen, pill counts as well as use of [place] Controlled Substance Reporting System.  3. Status post bilateral  TKR: Continue to Monitor 4. 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 Continue Lyrica  And Topamax 5.Chronic pain syndrome: Continue current medication regime. 6. Muscle Spasms: RX: Flexeril 10 mg QHS  30 minutes of face to face patient care time was spent during this visit. All questions were encouraged and answered.   F/U in 6 months

## 2015-12-30 ENCOUNTER — Other Ambulatory Visit: Payer: Self-pay | Admitting: Neurological Surgery

## 2016-01-03 MED FILL — LYRICA 100 MG CAPSULE: 100 | 30 days supply | Qty: 90 | Fill #3

## 2016-01-03 MED FILL — ANASTROZOLE 1 MG TABLET: 1 | 30 days supply | Qty: 30 | Fill #4

## 2016-01-06 ENCOUNTER — Encounter: Payer: Self-pay | Admitting: Podiatry

## 2016-01-06 ENCOUNTER — Ambulatory Visit (INDEPENDENT_AMBULATORY_CARE_PROVIDER_SITE_OTHER): Payer: Medicare Other | Admitting: Podiatry

## 2016-01-06 DIAGNOSIS — B351 Tinea unguium: Secondary | ICD-10-CM | POA: Diagnosis not present

## 2016-01-06 DIAGNOSIS — M79676 Pain in unspecified toe(s): Secondary | ICD-10-CM

## 2016-01-06 NOTE — Progress Notes (Signed)
Subjective:     Patient ID: Kelsey Wilson, female   DOB: Jan 17, 1961, 55 y.o.   MRN: [id number]  HPIThis patient returns for nail care both feet.  She admits to having right knee treatment prior to visit.  She states she has pain through bottom of both feet which were treated plantar fascial brace which helped some.  Peripheral neuropathy. She says her nails all fell off during chemo except second toenail right which is thick and painful.   Review of Systems     Objective:   Physical Exam GENERAL APPEARANCE: Alert, conversant. Appropriately groomed. No acute distress.  VASCULAR: Pedal pulses palpable at 2/4 DP and PT bilateral.  Capillary refill time is immediate to all digits,  Proximal to distal cooling it warm to warm.  Digital hair growth is present bilateral  NEUROLOGIC: sensation is intact epicritically and protectively to 5.07 monofilament at 5/5 sites bilateral.  Light touch is intact bilateral, vibratory sensation intact bilateral, achilles tendon reflex is intact bilateral.  MUSCULOSKELETAL: acceptable muscle strength, tone and stability bilateral.  Intrinsic muscluature intact bilateral.  Rectus appearance of foot and digits noted bilateral.   Plantar fascia painful  upon palpation B/L  DERMATOLOGIC: skin color, texture, and turgor are within normal limits.  No preulcerative lesions or ulcers  are seen, no interdigital maceration noted.  No open lesions present.  . No drainage noted. NAILS  Thick disfigured discolored nails both feet x 10      Assessment:     Onychomycosis    Plan:  Debridement of onychomycotic nails.  RTC  3 months. Discussed permanent removal of second toe right foot.   To schedule in future.   Gardiner Barefoot DPM

## 2016-01-18 NOTE — Pre-Procedure Instructions (Signed)
Kelsey Wilson  01/18/2016      [place], [place] - [address] [place] [address] Phone: [phone] Fax: [phone]  [id number] [place], [place] - [address] [place] [address] Phone: [phone] Fax: [phone]    Your procedure is scheduled on Fri, Aug 25 @ 8:00 AM  Report to [facility] Admitting at 6:00 AM  Call this number if you have problems the morning of surgery:  [phone]   Remember:  Do not eat food or drink liquids after midnight.  Take these medicines the morning of surgery with A SIP OF WATER Arimidex(Anastrozole),Lexapro(Escitalopram),Omeprazole(Prilosec),Eye Drops,Pregabalin(Lyrica),and Pain Pill(if needed)             Stop taking Phentermine.  No Goody's,BC's,Aleve,Advil,Motrin,IbuprofenFish Oil,or any Herbal Medications.   Do not wear jewelry, make-up or nail polish.  Do not wear lotions, powders, or perfumes.    Do not shave 48 hours prior to surgery.    Do not bring valuables to the hospital.  [facility] is not responsible for any belongings or valuables.  Contacts, dentures or bridgework may not be worn into surgery.  Leave your suitcase in the car.  After surgery it may be brought to your room.  For patients admitted to the hospital, discharge time will be determined by your treatment team.  Patients discharged the day of surgery will not be allowed to drive home.    [facility]  Before surgery, you can play an important role.  Because skin is not sterile, your skin needs to be as free of germs as possible.  You can reduce the number of germs on you skin by washing with CHG (chlorahexidine gluconate) soap before surgery.  CHG is an antiseptic cleaner which kills germs and bonds with the skin to continue killing germs even after washing.  Please DO NOT use if you have  an allergy to CHG or antibacterial soaps.  If your skin becomes reddened/irritated stop using the CHG and inform your nurse when you arrive at Short Stay.  Do not shave (including legs and underarms) for at least 48 hours prior to the first CHG shower.  You may shave your face.  Please follow these instructions carefully:   1.  Shower with CHG Soap the night before surgery and the                                morning of Surgery.  2.  If you choose to wash your hair, wash your hair first as usual with your       normal shampoo.  3.  After you shampoo, rinse your hair and body thoroughly to remove the                      Shampoo.  4.  Use CHG as you would any other liquid soap.  You can apply chg directly       to the skin and wash gently with scrungie or a clean washcloth.  5.  Apply the CHG Soap to your body ONLY FROM THE NECK DOWN.        Do not use on open wounds or open sores.  Avoid contact with your eyes,       ears, mouth and genitals (private parts).  Wash genitals (  private parts)       with your normal soap.  6.  Wash thoroughly, paying special attention to the area where your surgery        will be performed.  7.  Thoroughly rinse your body with warm water from the neck down.  8.  DO NOT shower/wash with your normal soap after using and rinsing off       the CHG Soap.  9.  Pat yourself dry with a clean towel.            10.  Wear clean pajamas.            11.  Place clean sheets on your bed the night of your first shower and do not        sleep with pets.  Day of Surgery  Do not apply any lotions/deoderants the morning of surgery.  Please wear clean clothes to the hospital/surgery center.    Please read over the following fact sheets that you were given. Pain Booklet, Coughing and Deep Breathing, MRSA Information and Surgical Site Infection Prevention

## 2016-01-19 ENCOUNTER — Encounter (HOSPITAL_COMMUNITY): Payer: Self-pay

## 2016-01-19 ENCOUNTER — Encounter (HOSPITAL_COMMUNITY)
Admission: RE | Admit: 2016-01-19 | Discharge: 2016-01-19 | Disposition: A | Discharge status: Home / Self Care | Payer: Medicare Other | Source: Ambulatory Visit | Attending: Neurological Surgery | Admitting: Neurological Surgery

## 2016-01-19 DIAGNOSIS — R9431 Abnormal electrocardiogram [ECG] [EKG]: Secondary | ICD-10-CM | POA: Diagnosis not present

## 2016-01-19 DIAGNOSIS — Z01812 Encounter for preprocedural laboratory examination: Secondary | ICD-10-CM | POA: Insufficient documentation

## 2016-01-19 DIAGNOSIS — M4806 Spinal stenosis, lumbar region: Secondary | ICD-10-CM | POA: Insufficient documentation

## 2016-01-19 DIAGNOSIS — Z01818 Encounter for other preprocedural examination: Secondary | ICD-10-CM | POA: Insufficient documentation

## 2016-01-19 DIAGNOSIS — M48061 Spinal stenosis, lumbar region without neurogenic claudication: Secondary | ICD-10-CM

## 2016-01-19 HISTORY — DX: Unspecified hemorrhoids: K64.9

## 2016-01-19 HISTORY — DX: Personal history of other medical treatment: Z92.89

## 2016-01-19 HISTORY — DX: Edema, unspecified: R60.9

## 2016-01-19 HISTORY — DX: Other chronic pain: G89.29

## 2016-01-19 HISTORY — DX: Pain in unspecified joint: M25.50

## 2016-01-19 HISTORY — DX: Personal history of other infectious and parasitic diseases: Z86.19

## 2016-01-19 HISTORY — DX: Nocturia: R35.1

## 2016-01-19 HISTORY — DX: Other muscle spasm: M62.838

## 2016-01-19 HISTORY — DX: Constipation, unspecified: K59.00

## 2016-01-19 HISTORY — DX: Other seasonal allergic rhinitis: J30.2

## 2016-01-19 HISTORY — DX: Effusion, unspecified joint: M25.40

## 2016-01-19 HISTORY — DX: Localized edema: R60.0

## 2016-01-19 HISTORY — DX: Personal history of colonic polyps: Z86.010

## 2016-01-19 HISTORY — DX: Personal history of colon polyps, unspecified: Z86.0100

## 2016-01-19 HISTORY — DX: Polyneuropathy, unspecified: G62.9

## 2016-01-19 HISTORY — DX: Dorsalgia, unspecified: M54.9

## 2016-01-19 LAB — CBC WITH DIFFERENTIAL/PLATELET
Basophils Absolute: 0 10*3/uL (ref 0.0–0.1)
Basophils Relative: 0 %
Eosinophils Absolute: 0.1 10*3/uL (ref 0.0–0.7)
Eosinophils Relative: 3 %
HCT: 36.9 % (ref 36.0–46.0)
Hemoglobin: 12.1 g/dL (ref 12.0–15.0)
Lymphocytes Relative: 47 %
Lymphs Abs: 1.6 10*3/uL (ref 0.7–4.0)
MCH: 28.3 pg (ref 26.0–34.0)
MCHC: 32.8 g/dL (ref 30.0–36.0)
MCV: 86.4 fL (ref 78.0–100.0)
Monocytes Absolute: 0.3 10*3/uL (ref 0.1–1.0)
Monocytes Relative: 8 %
Neutro Abs: 1.5 10*3/uL — ABNORMAL LOW (ref 1.7–7.7)
Neutrophils Relative %: 42 %
Platelets: 183 10*3/uL (ref 150–400)
RBC: 4.27 MIL/uL (ref 3.87–5.11)
RDW: 13.8 % (ref 11.5–15.5)
WBC: 3.5 10*3/uL — ABNORMAL LOW (ref 4.0–10.5)

## 2016-01-19 LAB — BASIC METABOLIC PANEL
Anion gap: 6 (ref 5–15)
BUN: 17 mg/dL (ref 6–20)
CO2: 28 mmol/L (ref 22–32)
Calcium: 9.3 mg/dL (ref 8.9–10.3)
Chloride: 105 mmol/L (ref 101–111)
Creatinine, Ser: 0.79 mg/dL (ref 0.44–1.00)
GFR calc Af Amer: >60 mL/min (ref >60)
GFR calc non Af Amer: >60 mL/min (ref >60)
Glucose, Bld: 100 mg/dL — ABNORMAL HIGH (ref 65–99)
Potassium: 3.3 mmol/L — ABNORMAL LOW (ref 3.5–5.1)
Sodium: 139 mmol/L (ref 135–145)

## 2016-01-19 LAB — SURGICAL PCR SCREEN
MRSA, PCR: NEGATIVE
Staphylococcus aureus: NEGATIVE

## 2016-01-19 LAB — PROTIME-INR
INR: 0.97
Prothrombin Time: 12.9 seconds (ref 11.4–15.2)

## 2016-01-19 MED ORDER — CHLORHEXIDINE GLUCONATE CLOTH 2 % EX PADS: 6.0000 | MEDICATED_PAD | Freq: Once | CUTANEOUS | Status: DC

## 2016-01-19 NOTE — Progress Notes (Addendum)
Cardiologist denies  Medical Md is Dr.Richard Pavelock  Echo report in epic from 2013  Stress test denies  Heart cath denies  EKG denies in past yr  CXR denies in past yr

## 2016-01-26 MED ORDER — SODIUM CHLORIDE 0.9 % IV SOLN
1500.0000 mg | INTRAVENOUS | Status: DC
Start: 2016-01-27 — End: 2016-01-26
  Filled 2016-01-26: qty 1500

## 2016-01-26 MED ORDER — VANCOMYCIN HCL 10 G IV SOLR
1500.0000 mg | INTRAVENOUS | Status: AC
  Administered 2016-01-27: 1500 mg via INTRAVENOUS
  Filled 2016-01-26: qty 1500

## 2016-01-26 NOTE — Anesthesia Preprocedure Evaluation (Addendum)
Anesthesia Evaluation  Patient identified by MRN, date of birth, ID band Patient awake    Reviewed: Allergy & Precautions, H&P , Patient's Chart, lab work & pertinent test results, reviewed documented beta blocker date and time   Airway Mallampati: III  TM Distance: >3 FB Neck ROM: full    Dental no notable dental hx.    Pulmonary    Pulmonary exam normal breath sounds clear to auscultation       Cardiovascular  Rhythm:regular Rate:Normal     Neuro/Psych    GI/Hepatic GERD  ,  Endo/Other  Morbid obesity  Renal/GU      Musculoskeletal   Abdominal   Peds  Hematology   Anesthesia Other Findings   Reproductive/Obstetrics                            Anesthesia Physical Anesthesia Plan  ASA: II  Anesthesia Plan: General   Post-op Pain Management:    Induction: Intravenous  Airway Management Planned: Oral ETT  Additional Equipment:   Intra-op Plan:   Post-operative Plan: Extubation in OR  Informed Consent: I have reviewed the patients History and Physical, chart, labs and discussed the procedure including the risks, benefits and alternatives for the proposed anesthesia with the patient or authorized representative who has indicated his/her understanding and acceptance.   Dental Advisory Given and Dental advisory given  Plan Discussed with: CRNA and Surgeon  Anesthesia Plan Comments: (  Discussed general anesthesia, including possible nausea, instrumentation of airway, sore throat,pulmonary aspiration, etc. I asked if the were any outstanding questions, or  concerns before we proceeded. )        Anesthesia Quick Evaluation

## 2016-01-27 ENCOUNTER — Ambulatory Visit (HOSPITAL_COMMUNITY): Payer: Medicare Other | Admitting: Anesthesiology

## 2016-01-27 ENCOUNTER — Ambulatory Visit (HOSPITAL_COMMUNITY): Payer: Medicare Other

## 2016-01-27 ENCOUNTER — Encounter (HOSPITAL_COMMUNITY)
Admission: RE | Disposition: A | Discharge status: Home / Self Care | Payer: Self-pay | Source: Ambulatory Visit | Attending: Neurological Surgery

## 2016-01-27 ENCOUNTER — Encounter (HOSPITAL_COMMUNITY): Payer: Self-pay | Admitting: *Deleted

## 2016-01-27 ENCOUNTER — Ambulatory Visit (HOSPITAL_COMMUNITY)
Admission: RE | Admit: 2016-01-27 | Discharge: 2016-01-28 | Disposition: A | Discharge status: Home / Self Care | Payer: Medicare Other | Source: Ambulatory Visit | Attending: Neurological Surgery | Admitting: Neurological Surgery

## 2016-01-27 DIAGNOSIS — G629 Polyneuropathy, unspecified: Secondary | ICD-10-CM | POA: Insufficient documentation

## 2016-01-27 DIAGNOSIS — Z6841 Body Mass Index (BMI) 40.0 and over, adult: Secondary | ICD-10-CM | POA: Diagnosis not present

## 2016-01-27 DIAGNOSIS — K219 Gastro-esophageal reflux disease without esophagitis: Secondary | ICD-10-CM | POA: Insufficient documentation

## 2016-01-27 DIAGNOSIS — Z01818 Encounter for other preprocedural examination: Secondary | ICD-10-CM

## 2016-01-27 DIAGNOSIS — Z419 Encounter for procedure for purposes other than remedying health state, unspecified: Secondary | ICD-10-CM

## 2016-01-27 DIAGNOSIS — Z96653 Presence of artificial knee joint, bilateral: Secondary | ICD-10-CM | POA: Diagnosis not present

## 2016-01-27 DIAGNOSIS — M797 Fibromyalgia: Secondary | ICD-10-CM | POA: Insufficient documentation

## 2016-01-27 DIAGNOSIS — F329 Major depressive disorder, single episode, unspecified: Secondary | ICD-10-CM | POA: Diagnosis not present

## 2016-01-27 DIAGNOSIS — M4316 Spondylolisthesis, lumbar region: Secondary | ICD-10-CM | POA: Insufficient documentation

## 2016-01-27 DIAGNOSIS — Z880 Allergy status to penicillin: Secondary | ICD-10-CM | POA: Diagnosis not present

## 2016-01-27 DIAGNOSIS — Z853 Personal history of malignant neoplasm of breast: Secondary | ICD-10-CM | POA: Diagnosis not present

## 2016-01-27 DIAGNOSIS — M4806 Spinal stenosis, lumbar region: Secondary | ICD-10-CM | POA: Insufficient documentation

## 2016-01-27 DIAGNOSIS — Z981 Arthrodesis status: Secondary | ICD-10-CM

## 2016-01-27 HISTORY — PX: LUMBAR LAMINECTOMY/DECOMPRESSION MICRODISCECTOMY: SHX5026

## 2016-01-27 IMAGING — CR DG LUMBAR SPINE 2-3V
2 series · 2 of 2 positions shown · non-contrast
Comparison: CT 11/01/2015

CLINICAL DATA: L4-5 laminectomy.

EXAM:
LUMBAR SPINE - 2-3 VIEW

[lat (1 of 2)]
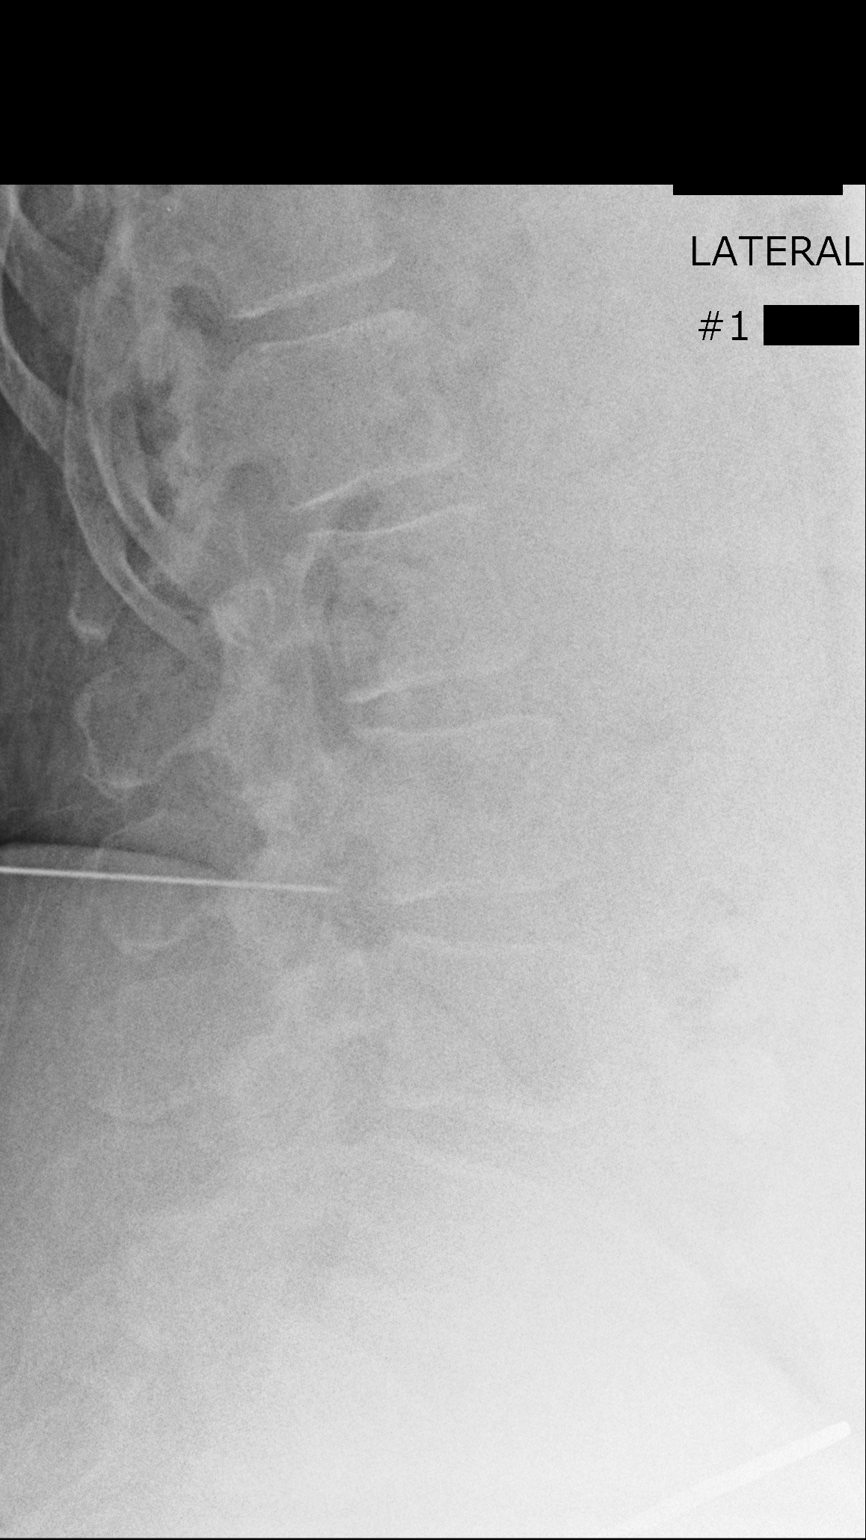

[lat (2 of 2)]
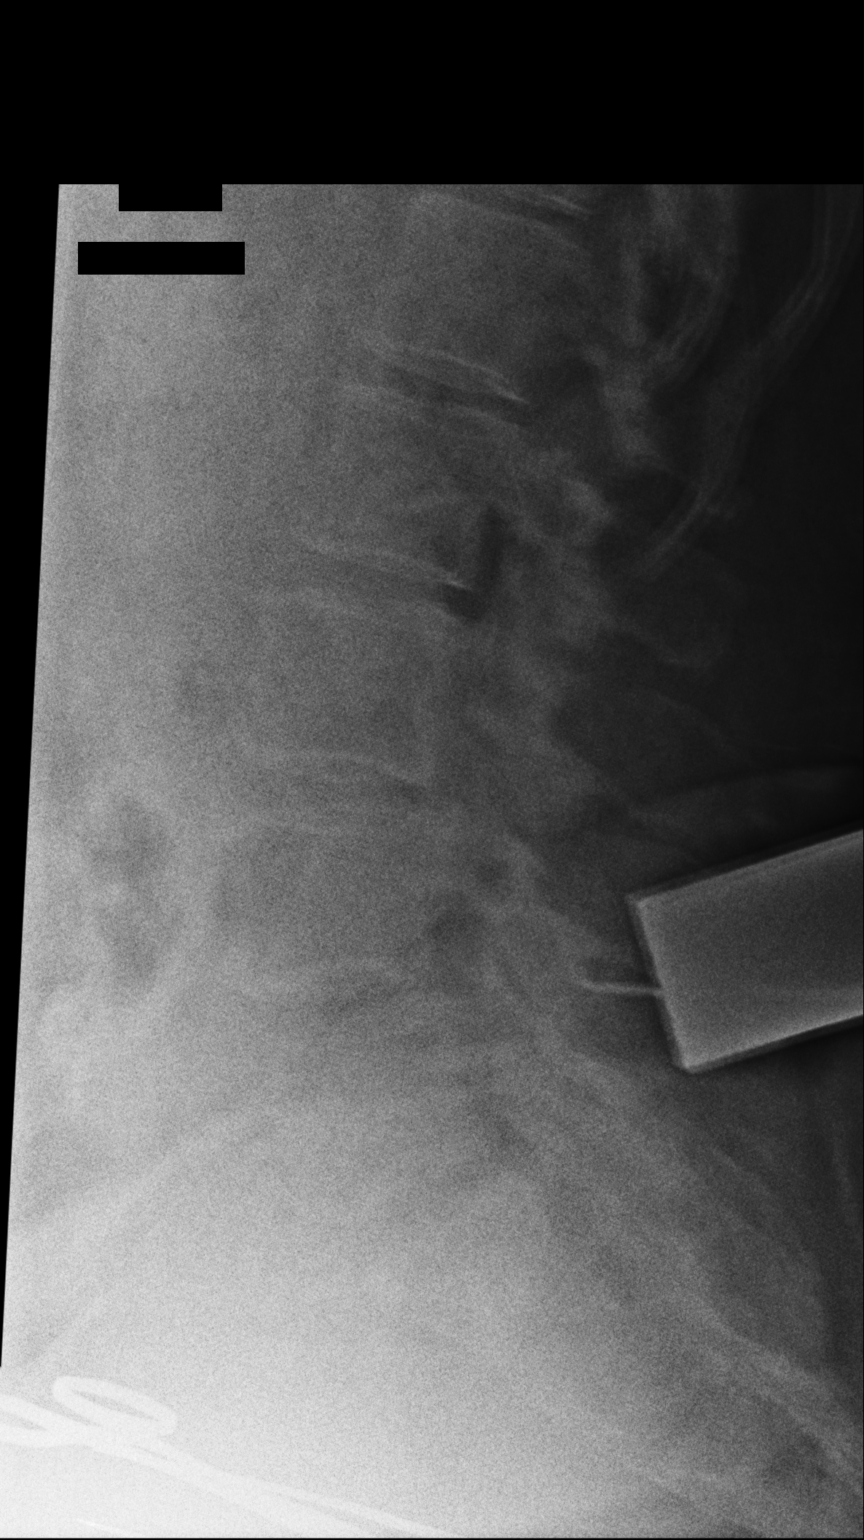

[2 of 2 positions shown; findings below may reference images not displayed]

FINDINGS: Using the same numbering scheme as the myelogram, initial films
shows a needle directed at the L3-4 disc level. Second film shows
tissue spreaders posteriorly with a probe directed at the L4-5
level.
IMPRESSION: L4-5 localized on the second film.

## 2016-01-27 IMAGING — CR DG CHEST 2V
2 series · 2 of 2 positions shown · non-contrast
Comparison: 06/18/2011 chest radiograph.

CLINICAL DATA: Preoperative for back surgery

EXAM:
CHEST  2 VIEW

[w chest pa]
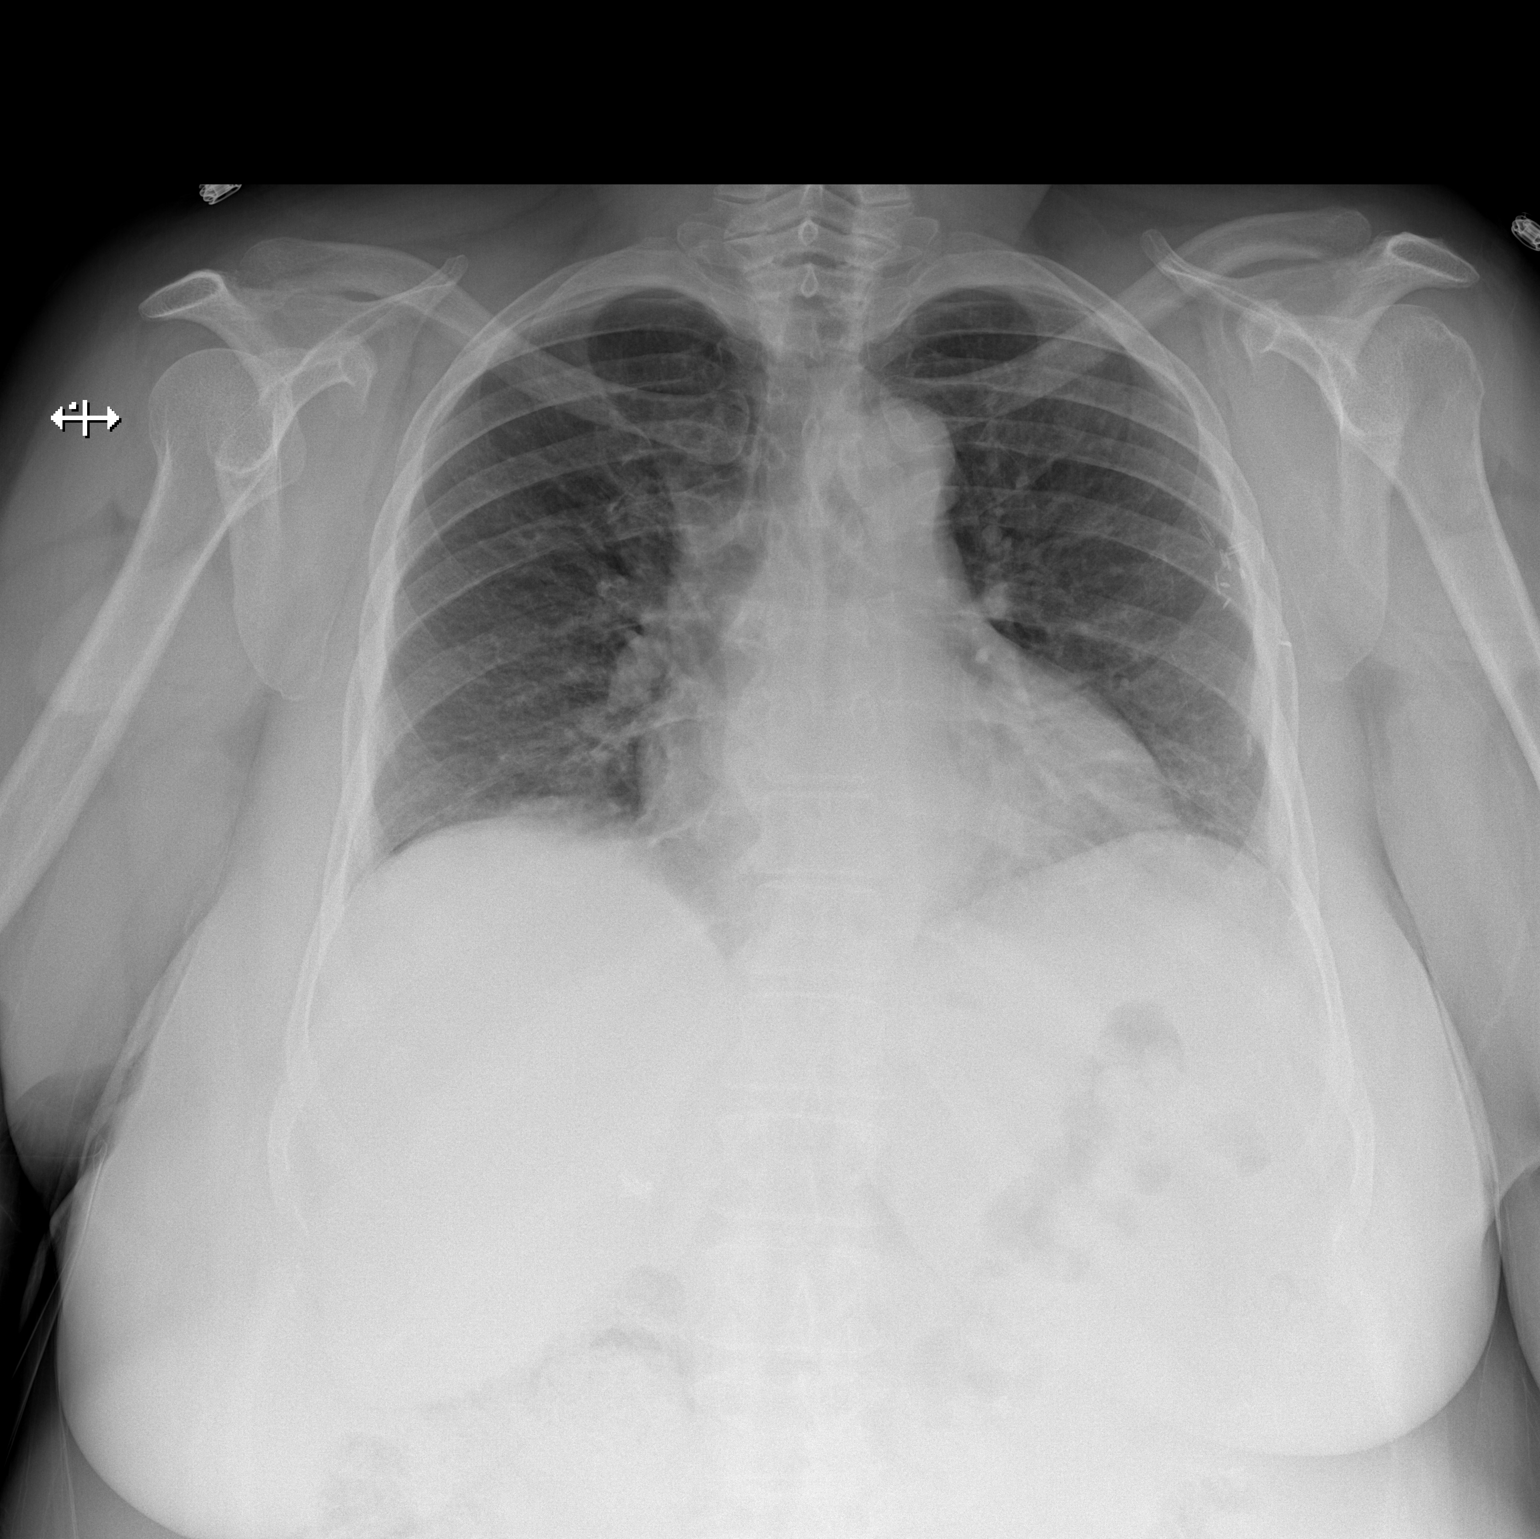

[w chest lat]
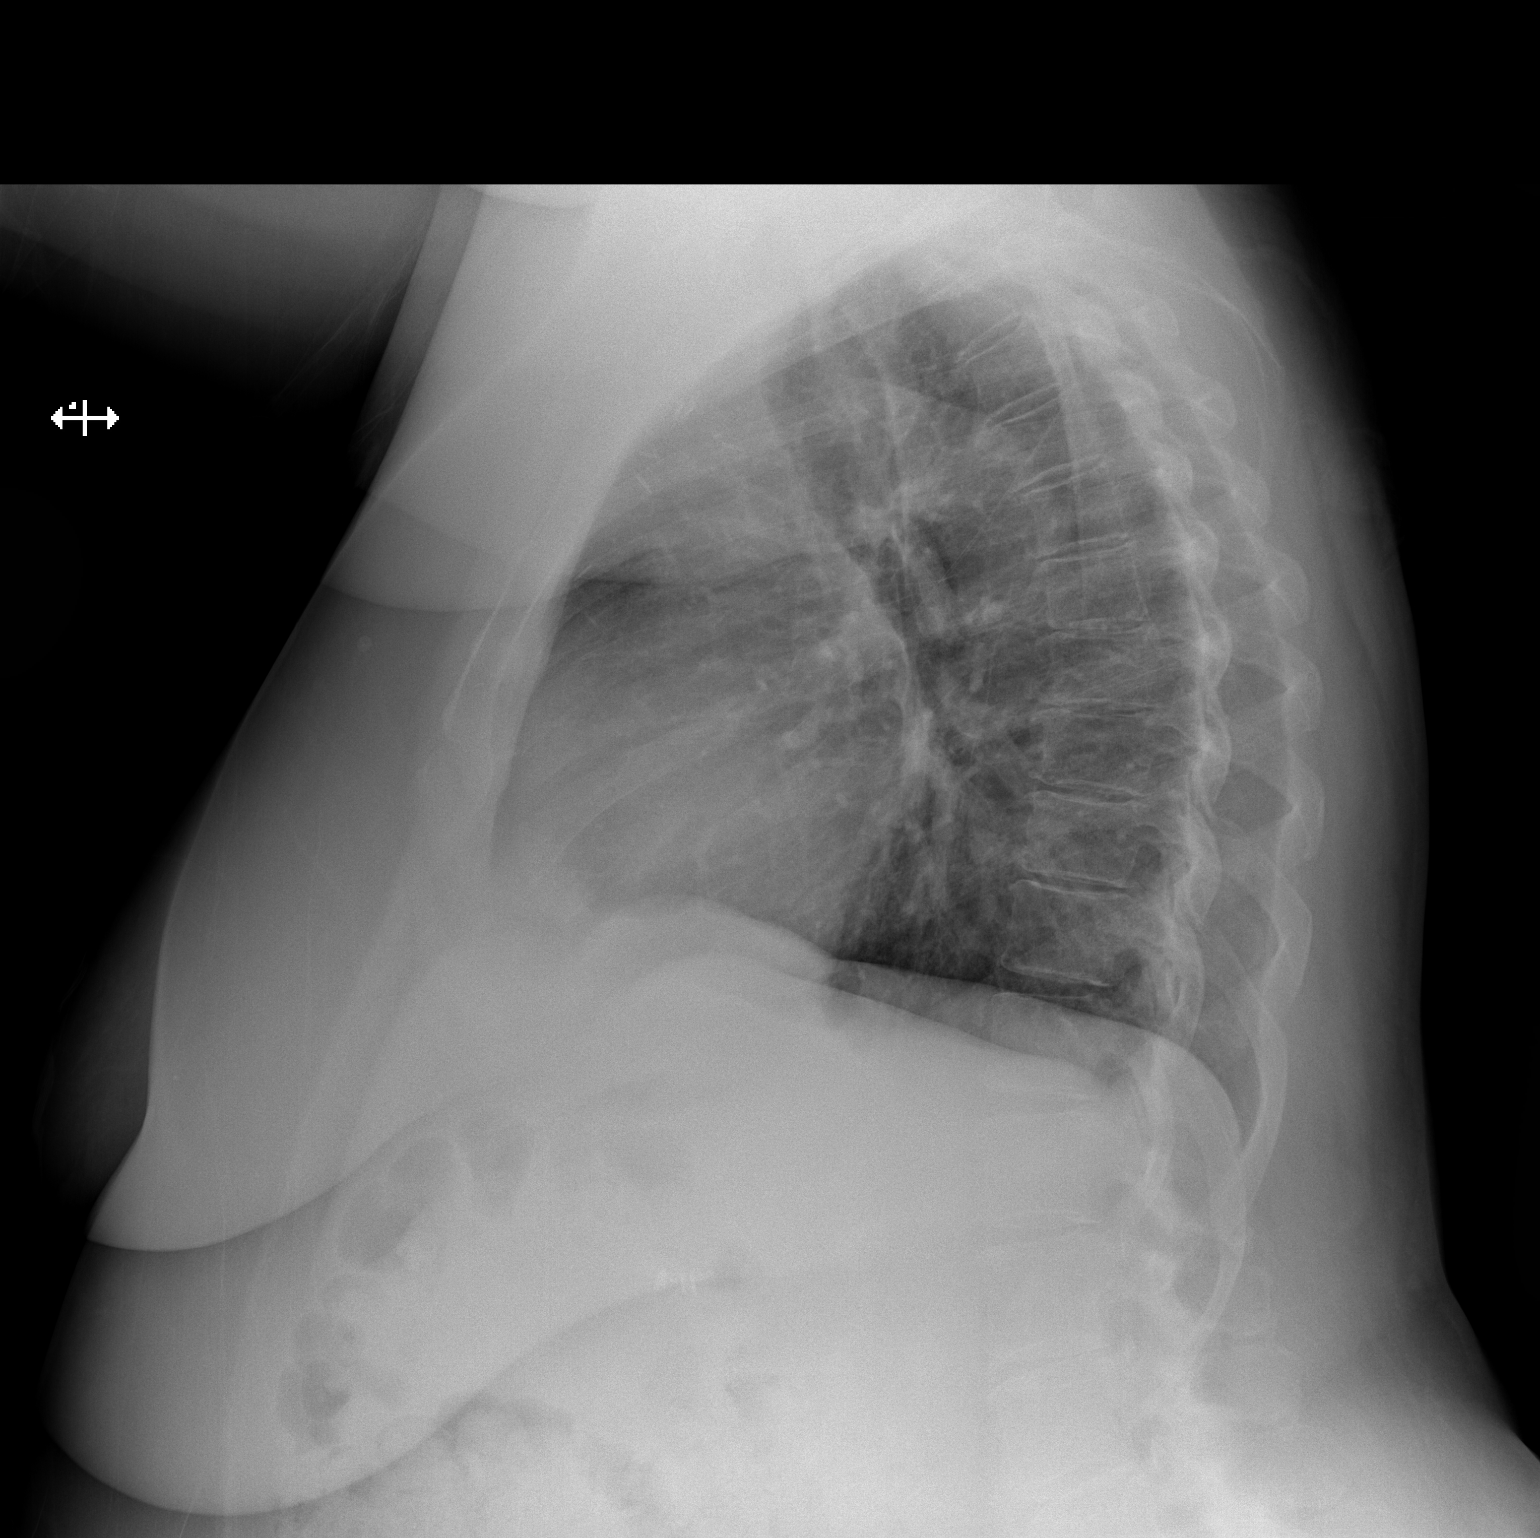

[2 of 2 positions shown; findings below may reference images not displayed]

FINDINGS: Surgical clips overlying left axilla. Stable cardiomediastinal
silhouette with top-normal heart size. No pneumothorax. No pleural
effusion. No pulmonary edema. No acute consolidative airspace
disease. Mild bibasilar scarring versus atelectasis. Cholecystectomy
clips are seen in the right upper quadrant of the abdomen.
IMPRESSION: Mild bibasilar scarring versus atelectasis. Otherwise no active
disease in the chest.

## 2016-01-27 SURGERY — LUMBAR LAMINECTOMY/DECOMPRESSION MICRODISCECTOMY 1 LEVEL
Anesthesia: General | Site: Back | Laterality: Bilateral

## 2016-01-27 MED ORDER — SODIUM CHLORIDE 0.9% FLUSH: 3.0000 mL | INTRAVENOUS | Status: DC | PRN

## 2016-01-27 MED ORDER — MORPHINE SULFATE (PF) 2 MG/ML IV SOLN
1.0000 mg | INTRAVENOUS | Status: DC | PRN
  Administered 2016-01-27: 4 mg via INTRAVENOUS
  Filled 2016-01-27: qty 2

## 2016-01-27 MED ORDER — POTASSIUM CHLORIDE IN NACL 20-0.9 MEQ/L-% IV SOLN
INTRAVENOUS | Status: DC
  Filled 2016-01-27: qty 1000

## 2016-01-27 MED ORDER — PREGABALIN 50 MG PO CAPS
100.0000 mg | ORAL_CAPSULE | Freq: Three times a day (TID) | ORAL | Status: DC
  Administered 2016-01-27 (×2): 100 mg via ORAL
  Filled 2016-01-27 (×2): qty 2

## 2016-01-27 MED ORDER — THROMBIN 5000 UNITS EX SOLR
OROMUCOSAL | Status: DC | PRN
  Administered 2016-01-27: 10:00:00 via TOPICAL

## 2016-01-27 MED ORDER — LIDOCAINE HCL (CARDIAC) 20 MG/ML IV SOLN
INTRAVENOUS | Status: DC | PRN
  Administered 2016-01-27: 100 mg via INTRAVENOUS

## 2016-01-27 MED ORDER — BENZTROPINE MESYLATE 0.5 MG PO TABS
0.5000 mg | ORAL_TABLET | Freq: Two times a day (BID) | ORAL | Status: DC
  Administered 2016-01-27 (×2): 0.5 mg via ORAL
  Filled 2016-01-27 (×3): qty 1

## 2016-01-27 MED ORDER — HYDROCODONE-ACETAMINOPHEN 5-325 MG PO TABS
1.0000 | ORAL_TABLET | ORAL | Status: DC | PRN
  Administered 2016-01-27 – 2016-01-28 (×4): 2 via ORAL
  Filled 2016-01-27 (×4): qty 2

## 2016-01-27 MED ORDER — VANCOMYCIN HCL IN DEXTROSE 1-5 GM/200ML-% IV SOLN
1000.0000 mg | Freq: Once | INTRAVENOUS | Status: AC
  Administered 2016-01-27: 1000 mg via INTRAVENOUS
  Filled 2016-01-27: qty 200

## 2016-01-27 MED ORDER — LIDOCAINE 2% (20 MG/ML) 5 ML SYRINGE
INTRAMUSCULAR | Status: AC
  Filled 2016-01-27: qty 5

## 2016-01-27 MED ORDER — ONDANSETRON HCL 4 MG/2ML IJ SOLN: 4.0000 mg | INTRAMUSCULAR | Status: DC | PRN

## 2016-01-27 MED ORDER — ANASTROZOLE 1 MG PO TABS
1.0000 mg | ORAL_TABLET | Freq: Every day | ORAL | Status: DC
  Filled 2016-01-27: qty 1

## 2016-01-27 MED ORDER — SODIUM CHLORIDE 0.9 % IV SOLN: 250.0000 mL | INTRAVENOUS | Status: DC

## 2016-01-27 MED ORDER — MIDAZOLAM HCL 5 MG/5ML IJ SOLN
INTRAMUSCULAR | Status: DC | PRN
  Administered 2016-01-27: 1 mg via INTRAVENOUS

## 2016-01-27 MED ORDER — FUROSEMIDE 40 MG PO TABS: 40.0000 mg | ORAL_TABLET | Freq: Every day | ORAL | Status: DC

## 2016-01-27 MED ORDER — DULOXETINE HCL 30 MG PO CPEP: 60.0000 mg | ORAL_CAPSULE | Freq: Every day | ORAL | Status: DC

## 2016-01-27 MED ORDER — PROPOFOL 10 MG/ML IV BOLUS
INTRAVENOUS | Status: DC | PRN
  Administered 2016-01-27: 160 mg via INTRAVENOUS

## 2016-01-27 MED ORDER — LACTATED RINGERS IV SOLN
INTRAVENOUS | Status: DC
  Administered 2016-01-27 (×3): via INTRAVENOUS

## 2016-01-27 MED ORDER — HEMOSTATIC AGENTS (NO CHARGE) OPTIME
TOPICAL | Status: DC | PRN
  Administered 2016-01-27: 1 via TOPICAL

## 2016-01-27 MED ORDER — DOCUSATE SODIUM 100 MG PO CAPS
100.0000 mg | ORAL_CAPSULE | Freq: Two times a day (BID) | ORAL | Status: DC
  Administered 2016-01-27 (×2): 100 mg via ORAL
  Filled 2016-01-27 (×2): qty 1

## 2016-01-27 MED ORDER — ONDANSETRON HCL 4 MG/2ML IJ SOLN
INTRAMUSCULAR | Status: DC | PRN
  Administered 2016-01-27: 4 mg via INTRAVENOUS

## 2016-01-27 MED ORDER — MENTHOL 3 MG MT LOZG: 1.0000 | LOZENGE | OROMUCOSAL | Status: DC | PRN

## 2016-01-27 MED ORDER — MIDAZOLAM HCL 2 MG/2ML IJ SOLN
INTRAMUSCULAR | Status: AC
  Filled 2016-01-27: qty 2

## 2016-01-27 MED ORDER — FENTANYL CITRATE (PF) 100 MCG/2ML IJ SOLN
INTRAMUSCULAR | Status: DC | PRN
Start: 2016-01-27 — End: 2016-01-27
  Administered 2016-01-27: 50 ug via INTRAVENOUS
  Administered 2016-01-27: 100 ug via INTRAVENOUS
  Administered 2016-01-27: 50 ug via INTRAVENOUS

## 2016-01-27 MED ORDER — DEXAMETHASONE SODIUM PHOSPHATE 10 MG/ML IJ SOLN
10.0000 mg | INTRAMUSCULAR | Status: AC
  Administered 2016-01-27: 10 mg via INTRAVENOUS
  Filled 2016-01-27: qty 1

## 2016-01-27 MED ORDER — PROPOFOL 10 MG/ML IV BOLUS
INTRAVENOUS | Status: AC
  Filled 2016-01-27: qty 20

## 2016-01-27 MED ORDER — METHOCARBAMOL 1000 MG/10ML IJ SOLN
500.0000 mg | Freq: Four times a day (QID) | INTRAVENOUS | Status: DC | PRN
  Filled 2016-01-27: qty 5

## 2016-01-27 MED ORDER — ONDANSETRON HCL 4 MG/2ML IJ SOLN
INTRAMUSCULAR | Status: AC
  Filled 2016-01-27: qty 2

## 2016-01-27 MED ORDER — DOCUSATE SODIUM 100 MG PO CAPS: 100.0000 mg | ORAL_CAPSULE | Freq: Two times a day (BID) | ORAL | Status: DC

## 2016-01-27 MED ORDER — ACETAMINOPHEN 650 MG RE SUPP: 650.0000 mg | RECTAL | Status: DC | PRN

## 2016-01-27 MED ORDER — SODIUM CHLORIDE 0.9 % IR SOLN
Status: DC | PRN
  Administered 2016-01-27: 10:00:00

## 2016-01-27 MED ORDER — 0.9 % SODIUM CHLORIDE (POUR BTL) OPTIME
TOPICAL | Status: DC | PRN
  Administered 2016-01-27: 1000 mL

## 2016-01-27 MED ORDER — THROMBIN 5000 UNITS EX SOLR
CUTANEOUS | Status: DC | PRN
  Administered 2016-01-27 (×2): 5000 [IU] via TOPICAL

## 2016-01-27 MED ORDER — SUCCINYLCHOLINE CHLORIDE 200 MG/10ML IV SOSY
PREFILLED_SYRINGE | INTRAVENOUS | Status: AC
  Filled 2016-01-27: qty 10

## 2016-01-27 MED ORDER — BUPIVACAINE HCL (PF) 0.25 % IJ SOLN
INTRAMUSCULAR | Status: DC | PRN
  Administered 2016-01-27: 3 mL

## 2016-01-27 MED ORDER — PHENOL 1.4 % MT LIQD: 1.0000 | OROMUCOSAL | Status: DC | PRN

## 2016-01-27 MED ORDER — METHOCARBAMOL 500 MG PO TABS
500.0000 mg | ORAL_TABLET | Freq: Four times a day (QID) | ORAL | Status: DC | PRN
  Administered 2016-01-27 – 2016-01-28 (×2): 500 mg via ORAL
  Filled 2016-01-27 (×2): qty 1

## 2016-01-27 MED ORDER — FENTANYL CITRATE (PF) 100 MCG/2ML IJ SOLN
25.0000 ug | INTRAMUSCULAR | Status: DC | PRN
Start: 2016-01-27 — End: 2016-01-27

## 2016-01-27 MED ORDER — SODIUM CHLORIDE 0.9% FLUSH: 3.0000 mL | Freq: Two times a day (BID) | INTRAVENOUS | Status: DC

## 2016-01-27 MED ORDER — MONTELUKAST SODIUM 10 MG PO TABS
10.0000 mg | ORAL_TABLET | Freq: Every day | ORAL | Status: DC
Start: 2016-01-27 — End: 2016-01-28
  Administered 2016-01-27: 10 mg via ORAL
  Filled 2016-01-27: qty 1

## 2016-01-27 MED ORDER — FENTANYL CITRATE (PF) 100 MCG/2ML IJ SOLN
INTRAMUSCULAR | Status: AC
  Filled 2016-01-27: qty 6

## 2016-01-27 MED ORDER — ACETAMINOPHEN 325 MG PO TABS: 650.0000 mg | ORAL_TABLET | ORAL | Status: DC | PRN

## 2016-01-27 MED ORDER — SUCCINYLCHOLINE CHLORIDE 20 MG/ML IJ SOLN
INTRAMUSCULAR | Status: DC | PRN
  Administered 2016-01-27: 140 mg via INTRAVENOUS

## 2016-01-27 MED ORDER — PHENYLEPHRINE 40 MCG/ML (10ML) SYRINGE FOR IV PUSH (FOR BLOOD PRESSURE SUPPORT)
PREFILLED_SYRINGE | INTRAVENOUS | Status: AC
  Filled 2016-01-27: qty 10

## 2016-01-27 SURGICAL SUPPLY — 44 items
ADH SKN CLS APL DERMABOND .7 (GAUZE/BANDAGES/DRESSINGS) ×1
APL SKNCLS STERI-STRIP NONHPOA (GAUZE/BANDAGES/DRESSINGS) ×1
BAG DECANTER FOR FLEXI CONT (MISCELLANEOUS) ×2 IMPLANT
BENZOIN TINCTURE PRP APPL 2/3 (GAUZE/BANDAGES/DRESSINGS) ×2 IMPLANT
BUR MATCHSTICK NEURO 3.0 LAGG (BURR) ×2 IMPLANT
CANISTER SUCT 3000ML PPV (MISCELLANEOUS) ×2 IMPLANT
DERMABOND ADVANCED (GAUZE/BANDAGES/DRESSINGS) ×1
DERMABOND ADVANCED .7 DNX12 (GAUZE/BANDAGES/DRESSINGS) ×1 IMPLANT
DRAPE LAPAROTOMY 100X72X124 (DRAPES) ×2 IMPLANT
DRAPE MICROSCOPE LEICA (MISCELLANEOUS) ×2 IMPLANT
DRAPE POUCH INSTRU U-SHP 10X18 (DRAPES) ×2 IMPLANT
DRAPE SURG 17X23 STRL (DRAPES) ×2 IMPLANT
DRSG OPSITE 4X5.5 SM (GAUZE/BANDAGES/DRESSINGS) ×2 IMPLANT
DRSG TELFA 3X8 NADH (GAUZE/BANDAGES/DRESSINGS) ×2 IMPLANT
DURAPREP 26ML APPLICATOR (WOUND CARE) ×2 IMPLANT
ELECT REM PT RETURN 9FT ADLT (ELECTROSURGICAL) ×2
ELECTRODE REM PT RTRN 9FT ADLT (ELECTROSURGICAL) ×1 IMPLANT
GAUZE SPONGE 4X4 16PLY XRAY LF (GAUZE/BANDAGES/DRESSINGS) IMPLANT
GLOVE BIO SURGEON STRL SZ8 (GLOVE) ×2 IMPLANT
GOWN STRL REUS W/ TWL LRG LVL3 (GOWN DISPOSABLE) IMPLANT
GOWN STRL REUS W/ TWL XL LVL3 (GOWN DISPOSABLE) ×1 IMPLANT
GOWN STRL REUS W/TWL 2XL LVL3 (GOWN DISPOSABLE) IMPLANT
GOWN STRL REUS W/TWL LRG LVL3 (GOWN DISPOSABLE)
GOWN STRL REUS W/TWL XL LVL3 (GOWN DISPOSABLE) ×2
HEMOSTAT POWDER KIT SURGIFOAM (HEMOSTASIS) IMPLANT
KIT BASIN OR (CUSTOM PROCEDURE TRAY) ×2 IMPLANT
KIT ROOM TURNOVER OR (KITS) ×2 IMPLANT
NEEDLE HYPO 25X1 1.5 SAFETY (NEEDLE) ×2 IMPLANT
NEEDLE SPNL 20GX3.5 QUINCKE YW (NEEDLE) IMPLANT
NS IRRIG 1000ML POUR BTL (IV SOLUTION) ×2 IMPLANT
PACK LAMINECTOMY NEURO (CUSTOM PROCEDURE TRAY) ×2 IMPLANT
PAD ARMBOARD 7.5X6 YLW CONV (MISCELLANEOUS) ×6 IMPLANT
PUTTY BONE ATTRAX 5CC STRIP (Putty) ×2 IMPLANT
RUBBERBAND STERILE (MISCELLANEOUS) ×4 IMPLANT
SPONGE SURGIFOAM ABS GEL SZ50 (HEMOSTASIS) ×2 IMPLANT
STRIP CLOSURE SKIN 1/2X4 (GAUZE/BANDAGES/DRESSINGS) ×2 IMPLANT
SUT VIC AB 0 CT1 18XCR BRD8 (SUTURE) ×1 IMPLANT
SUT VIC AB 0 CT1 8-18 (SUTURE) ×2
SUT VIC AB 2-0 CP2 18 (SUTURE) ×2 IMPLANT
SUT VIC AB 3-0 SH 8-18 (SUTURE) ×2 IMPLANT
TOWEL OR 17X24 6PK STRL BLUE (TOWEL DISPOSABLE) ×2 IMPLANT
TOWEL OR 17X26 10 PK STRL BLUE (TOWEL DISPOSABLE) ×2 IMPLANT
TRAP SPECIMEN MUCOUS 40CC (MISCELLANEOUS) ×2 IMPLANT
WATER STERILE IRR 1000ML POUR (IV SOLUTION) ×2 IMPLANT

## 2016-01-27 NOTE — Anesthesia Procedure Notes (Addendum)
Procedure Name: Intubation Date/Time: 01/27/2016 8:57 AM Performed by: Rebekah Chesterfield L Pre-anesthesia Checklist: Patient identified, Emergency Drugs available, Suction available and Patient being monitored Patient Re-evaluated:Patient Re-evaluated prior to inductionOxygen Delivery Method: Circle System Utilized Preoxygenation: Pre-oxygenation with 100% oxygen Intubation Type: IV induction Ventilation: Mask ventilation without difficulty Laryngoscope Size: Mac and 3 Grade View: Grade II Tube type: Oral Tube size: 7.5 mm Number of attempts: 1 Airway Equipment and Method: Stylet Placement Confirmation: ETT inserted through vocal cords under direct vision,  positive ETCO2 and breath sounds checked- equal and bilateral Secured at: 21 cm Tube secured with: Tape Dental Injury: Teeth and Oropharynx as per pre-operative assessment

## 2016-01-27 NOTE — Op Note (Signed)
01/27/2016  10:54 AM  PATIENT:  Kelsey Wilson  55 y.o. 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 Right L4-5 decompressive hemilaminectomy, medial facetectomy and foraminotomy followed by sublaminar decompression for central canal and left lateral recess decompression, 2. Posterior fusion L4-5 utilizing locally harvested morselized autologous bone graft and morcellized allograft  SURGEON:  Sherley Bounds, MD  ASSISTANTS: None  ANESTHESIA:   General  EBL: Less than 50 ml  Total I/O In: 1000 [I.V.:1000] Out: -   BLOOD ADMINISTERED:none  DRAINS: None   SPECIMEN:  No Specimen  INDICATION FOR PROCEDURE: This patient presented with a long history of back and bilateral leg pain. CT myelogram showed a grade 1 spondylolisthesis at L4-5 with severe spinal stenosis. She also has spondylolisthesis L5-S1 and at L3-4 without spinal stenosis or neural compression. She has multiple comorbidities, therefore recommended simple decompression followed by a onlay fusion should find her to have any gross instability at the time of surgery. Patient understood the risks, benefits, and alternatives and potential outcomes and wished to proceed.  PROCEDURE DETAILS: The patient was taken to the operating room and after induction of adequate generalized endotracheal anesthesia, the patient was rolled into the prone position on the Wilson frame and all pressure points were padded. The lumbar region was cleaned and then prepped with DuraPrep and draped in the usual sterile fashion. 5 cc of local anesthesia was injected and then a dorsal midline incision was made and carried down to the lumbo sacral fascia. The fascia was opened and the paraspinous musculature was taken down in a subperiosteal fashion to expose the L4-5 on the right. Intraoperative x-ray confirmed my level, and then I used a combination of the  high-speed drill and the Kerrison punches to perform a hemilaminectomy, medial facetectomy, and foraminotomy at L4-5 on the right. The underlying yellow ligament was opened and removed in a piecemeal fashion to expose the underlying dura and exiting nerve root. I undercut the lateral recess and dissected down until I was medial to and distal to the pedicle. The nerve root was well decompressed. I then used a high-speed drill to drill up under the spinous process and into the left lateral recess. I performed a sublaminar decompression with the 2 and 3 mm Kerrison punch to decompress the central canal and the left L5 nerve root. I then palpated with a coronary dilator along the nerve root and into the foramen to assure adequate decompression. I felt no more compression of the nerve root. I irrigated with saline solution containing bacitracin. I then drilled the opposite lamina and facet complex of L4 and L5 and placed a mixture of local autograft and morcellized allograft to perform arthrodesis L4-5.  Achieved hemostasis with bipolar cautery, lined the dura with Gelfoam, and then closed the fascia with 0 Vicryl. I closed the subcutaneous tissues with 2-0 Vicryl and the subcuticular tissues with 3-0 Vicryl. The skin was then closed with benzoin and Steri-Strips. The drapes were removed, a sterile dressing was applied. The patient was awakened from general anesthesia and transferred to the recovery room in stable condition. At the end of the procedure all sponge, needle and instrument counts were correct.   PLAN OF CARE: Admit for overnight observation  PATIENT DISPOSITION:  PACU - hemodynamically stable.   Delay start of Pharmacological VTE agent (>24hrs) due to surgical blood loss or risk of bleeding:  yes

## 2016-01-27 NOTE — Transfer of Care (Signed)
Immediate Anesthesia Transfer of Care Note  Patient: Kelsey Wilson  Procedure(s) Performed: Procedure(s): Laminectomy and Foraminotomy - Lumbar four -Lumbar five - bilateral- on-lay noninstrumented fusion (Bilateral)  Patient Location: PACU  Anesthesia Type:General  Level of Consciousness: awake, alert , oriented and patient cooperative  Airway & Oxygen Therapy: Patient Spontanous Breathing and Patient connected to nasal cannula oxygen  Post-op Assessment: Report given to RN, Post -op Vital signs reviewed and stable and Patient moving all extremities  Post vital signs: Reviewed and stable  Last Vitals:  Vitals:   01/27/16 0636 01/27/16 1050  BP: 133/80 119/79  Pulse: 89 87  Resp: 20 12  Temp: 36.8 C 36.4 C    Last Pain:  Vitals:   01/27/16 1050  TempSrc:   PainSc: 0-No pain      Patients Stated Pain Goal: 3 ([id number])  Complications: No apparent anesthesia complications

## 2016-01-27 NOTE — Anesthesia Postprocedure Evaluation (Signed)
Anesthesia Post Note  Patient: GALIT CINNAMON  Procedure(s) Performed: Procedure(s) (LRB): Laminectomy and Foraminotomy - Lumbar four -Lumbar five - bilateral- on-lay noninstrumented fusion (Bilateral)  Patient location during evaluation: PACU Anesthesia Type: MAC Level of consciousness: awake and alert Pain management: pain level controlled Vital Signs Assessment: post-procedure vital signs reviewed and stable Respiratory status: spontaneous breathing, nonlabored ventilation, respiratory function stable and patient connected to nasal cannula oxygen Cardiovascular status: stable and blood pressure returned to baseline Anesthetic complications: no    Last Vitals:  Vitals:   01/27/16 1150 01/27/16 1215  BP: 129/73 (!) 141/85  Pulse: 70 72  Resp: 17 18  Temp: 36.3 C 36.4 C    Last Pain:  Vitals:   01/27/16 1308  TempSrc:   PainSc: Asleep                 Timara Loma EDWARD

## 2016-01-27 NOTE — H&P (Signed)
Subjective: Patient is a 55 y.o. female admitted for stenosis. Onset of symptoms was several months ago, gradually worsening since that time.  The pain is rated severe, and is located at the across the lower back and radiates to legs. The pain is described as aching and occurs all day. The symptoms have been progressive. Symptoms are exacerbated by exercise. MRI or CT showed spondylolisthesis with stenosis L4-5   Past Medical History:  Diagnosis Date  . Anemia   . Anxiety   . Arthritis   . Breast cancer ([place]) 2010   T3N1 invasive ductal carcinoma left breast.Takes Arimidex daily  . Bursitis   . Carpal tunnel syndrome   . Chronic back pain    stenosis  . Constipation    takes Colace daily  . Depression    takes Benzotropine daily  . Diverticulitis of colon   . Fibromyalgia 08/2012  . GERD (gastroesophageal reflux disease)    takes Dexilant daily  . Hemorrhoid   . History of blood transfusion    no abnormal reaction noted  . History of colon polyps    benign  . History of shingles   . Joint pain   . Joint swelling   . Night muscle spasms    takes Flexeril nightly as needed  . Nocturia   . Peripheral edema    takes Furosemide.Just started 01/18/16  . Peripheral neuropathy (HCC)    takes Lyrica daily  . Pneumonia    hx of-2015  . Seasonal allergies    takes Singulair nightly  . SOB (shortness of breath) on exertion    rarely with exertion    Past Surgical History:  Procedure Laterality Date  . ABDOMINAL HYSTERECTOMY     still has ovaries  . APPENDECTOMY    . AXILLARY LYMPH NODE DISSECTION  11/28/2011   Procedure: AXILLARY LYMPH NODE DISSECTION;  Surgeon: Edward Jolly, MD;  Location: [facility];  Service: General;  Laterality: Left;  . BREAST SURGERY Left 2013  . CARPAL TUNNEL RELEASE     Bilateral  . CHOLECYSTECTOMY    . COLONOSCOPY    . EYE SURGERY Left    cataract removal  . KNEE SURGERY     Left Knee  . PORTACATH PLACEMENT  06/18/2011   Procedure:  INSERTION PORT-A-CATH;  Surgeon: Edward Jolly, MD;  Location: [facility];  Service: General;  Laterality: Right;  right subclavian  . removal portacath  2014  . spur     Apex spur on both big toes  . TOE SURGERY Bilateral   . TONSILLECTOMY    . TOTAL KNEE ARTHROPLASTY Left 09/13/2014  . TOTAL KNEE ARTHROPLASTY Left 09/13/2014   Procedure: LEFT TOTAL KNEE ARTHROPLASTY;  Surgeon: Rod Can, MD;  Location: [facility];  Service: Orthopedics;  Laterality: Left;  . TOTAL KNEE ARTHROPLASTY Right 03/10/2015   Procedure: RIGHT TOTAL KNEE ARTHROPLASTY;  Surgeon: Rod Can, MD;  Location: [facility];  Service: Orthopedics;  Laterality: Right;    Prior to Admission medications   Medication Sig Start Date End Date Taking? Authorizing Provider  acetaminophen (TYLENOL) 500 MG tablet Take 1,000 mg by mouth every 4 (four) hours as needed for mild pain or moderate pain.    Yes Historical Provider, MD  anastrozole (ARIMIDEX) 1 MG tablet Take 1 tablet (1 mg total) by mouth daily. 06/15/15  Yes Laurie Panda, NP  benztropine (COGENTIN) 0.5 MG tablet Take 0.5 mg by mouth 2 (two) times daily.   Yes Historical Provider, MD  cholecalciferol (VITAMIN D) 400 units TABS tablet Take 400 Units by mouth daily.   Yes Historical Provider, MD  cyclobenzaprine (FLEXERIL) 10 MG tablet Take 1 tablet (10 mg total) by mouth at bedtime as needed for muscle spasms. Patient taking differently: Take 10 mg by mouth 2 (two) times daily.  12/23/15  Yes Bayard Hugger, NP  Dexlansoprazole (DEXILANT) 30 MG capsule Take 30 mg by mouth daily.   Yes Historical Provider, MD  docusate sodium (COLACE) 100 MG capsule Take 1 capsule (100 mg total) by mouth every 12 (twelve) hours. 10/16/15  Yes Recardo Evangelist, PA-C  DULoxetine (CYMBALTA) 60 MG capsule Take 60 mg by mouth daily.   Yes Historical Provider, MD  furosemide (LASIX) 40 MG tablet Take 40 mg by mouth daily.   Yes Historical Provider, MD  montelukast (SINGULAIR)  10 MG tablet Take 10 mg by mouth at bedtime.   Yes Historical Provider, MD  potassium chloride SA (K-DUR,KLOR-CON) 20 MEQ tablet Take 20 mEq by mouth daily.   Yes Historical Provider, MD  pregabalin (LYRICA) 100 MG capsule Take 1 capsule (100 mg total) by mouth 3 (three) times daily. 09/05/15  Yes Charlett Blake, MD  traMADol (ULTRAM) 50 MG tablet Take 1 tablet (50 mg total) by mouth every 6 (six) hours as needed. Patient taking differently: Take 50 mg by mouth every 6 (six) hours as needed for moderate pain or severe pain.  12/23/15  Yes Bayard Hugger, NP   Allergies  Allergen Reactions  . Oxycodone Other (See Comments)    hallucinations  . Aleve [Naproxen] Nausea Only  . Compazine [Prochlorperazine] Other (See Comments)    Numbness of face and  lips  . Penicillins Nausea Only    Has patient had a PCN reaction causing immediate rash, facial/tongue/throat swelling, SOB or lightheadedness with hypotension: No Has patient had a PCN reaction causing severe rash involving mucus membranes or skin necrosis: No Has patient had a PCN reaction that required hospitalization No Has patient had a PCN reaction occurring within the last 10 years: No If all of the above answers are "NO", then may proceed with Cephalosporin use.     Social History  Substance Use Topics  . Smoking status: Never Smoker  . Smokeless tobacco: Never Used  . Alcohol use No    Family History  Problem Relation Age of Onset  . Hypertension Mother   . Diabetes Mother   . Hypertension Father   . Diabetes Father   . Cancer Paternal Grandmother     unknown  . Colon cancer Neg Hx      Review of Systems  Positive ROS: neg  All other systems have been reviewed and were otherwise negative with the exception of those mentioned in the HPI and as above.  Objective: Vital signs in last 24 hours: Temp:  [98.3 F (36.8 C)] 98.3 F (36.8 C) (08/25 0636) Pulse Rate:  [89] 89 (08/25 0636) Resp:  [20] 20 (08/25 0636) BP:  (133)/(80) 133/80 (08/25 0636) SpO2:  [100 %] 100 % (08/25 0636) Weight:  [114.8 kg (253 lb)] 114.8 kg (253 lb) (08/25 0636)  General Appearance: Alert, cooperative, no distress, appears stated age Head: Normocephalic, without obvious abnormality, atraumatic Eyes: PERRL, conjunctiva/corneas clear, EOM's intact    Neck: Supple, symmetrical, trachea midline Back: Symmetric, no curvature, ROM normal, no CVA tenderness Lungs:  respirations unlabored Heart: Regular rate and rhythm Abdomen: Soft, non-tender Extremities: Extremities normal, atraumatic, no cyanosis or edema Pulses: 2+ and symmetric  all extremities Skin: Skin color, texture, turgor normal, no rashes or lesions  NEUROLOGIC:   Mental status: Alert and oriented x4,  no aphasia, good attention span, fund of knowledge, and memory Motor Exam - grossly normal Sensory Exam - grossly normal Reflexes: 1+ Coordination - grossly normal Gait - grossly normal Balance - grossly normal Cranial Nerves: I: smell Not tested  II: visual acuity  OS: nl    OD: nl  II: visual fields Full to confrontation  II: pupils Equal, round, reactive to light  III,VII: ptosis None  III,IV,VI: extraocular muscles  Full ROM  V: mastication Normal  V: facial light touch sensation  Normal  V,VII: corneal reflex  Present  VII: facial muscle function - upper  Normal  VII: facial muscle function - lower Normal  VIII: hearing Not tested  IX: soft palate elevation  Normal  IX,X: gag reflex Present  XI: trapezius strength  5/5  XI: sternocleidomastoid strength 5/5  XI: neck flexion strength  5/5  XII: tongue strength  Normal    Data Review Lab Results  Component Value Date   WBC 3.5 (L) 01/19/2016   HGB 12.1 01/19/2016   HCT 36.9 01/19/2016   MCV 86.4 01/19/2016   PLT 183 01/19/2016   Lab Results  Component Value Date   NA 139 01/19/2016   K 3.3 (L) 01/19/2016   CL 105 01/19/2016   CO2 28 01/19/2016   BUN 17 01/19/2016   CREATININE 0.79  01/19/2016   GLUCOSE 100 (H) 01/19/2016   Lab Results  Component Value Date   INR 0.97 01/19/2016    Assessment/Plan: Patient admitted for DLL L4-5 with possible on-lay fusion. Patient has failed a reasonable attempt at conservative therapy.  I explained the condition and procedure to the patient and answered any questions.  Patient wishes to proceed with procedure as planned. Understands risks/ benefits and typical outcomes of procedure.   JONES,DAVID S 01/27/2016 7:07 AM

## 2016-01-28 DIAGNOSIS — M4806 Spinal stenosis, lumbar region: Secondary | ICD-10-CM | POA: Diagnosis not present

## 2016-01-28 MED ORDER — METHOCARBAMOL 500 MG PO TABS: 500.0000 mg | ORAL_TABLET | Freq: Four times a day (QID) | ORAL | 1 refills | Status: DC | PRN

## 2016-01-28 MED ORDER — HYDROCODONE-ACETAMINOPHEN 5-325 MG PO TABS: 1.0000 | ORAL_TABLET | Freq: Four times a day (QID) | ORAL | 0 refills | Status: DC | PRN

## 2016-01-28 MED ORDER — CYCLOBENZAPRINE HCL 10 MG PO TABS: 10.0000 mg | ORAL_TABLET | Freq: Three times a day (TID) | ORAL | 1 refills | Status: DC | PRN

## 2016-01-28 NOTE — Progress Notes (Signed)
Pt given D/C instructions with Rx's, verbal understanding was provided. Pt's incision is clean and dry with no sign of infection. Pt's IV was removed prior to D/C. Pt D/C'd home via wheelchair @ 1130 per MD order. Pt is stable @ D/C and has no other needs at this time. Holli Humbles, RN

## 2016-01-28 NOTE — Discharge Summary (Signed)
Physician Discharge Summary  Patient ID: Kelsey Wilson MRN: [id number] DOB/AGE: [birth year] 55 y.o.  Admit date: 01/27/2016 Discharge date: 01/28/2016  Admission Diagnoses: Lumbar spinal stenosis with spondylolisthesis   Discharge Diagnoses: Same   Discharged Condition: good  Hospital Course: The patient was admitted on 01/27/2016 and taken to the operating room where the patient underwent decompressive laminectomy L4-5 with non-instrumented fusion. The patient tolerated the procedure well and was taken to the recovery room and then to the floor in stable condition. The hospital course was routine. There were no complications. The wound remained clean dry and intact. Pt had appropriate back soreness. No complaints of leg pain or new N/T/W. The patient remained afebrile with stable vital signs, and tolerated a regular diet. The patient continued to increase activities, and pain was well controlled with oral pain medications.   Consults: None  Significant Diagnostic Studies:  Results for orders placed or performed during the hospital encounter of 01/19/16  Surgical pcr screen  Result Value Ref Range   MRSA, PCR NEGATIVE NEGATIVE   Staphylococcus aureus NEGATIVE NEGATIVE  Basic metabolic panel  Result Value Ref Range   Sodium 139 135 - 145 mmol/L   Potassium 3.3 (L) 3.5 - 5.1 mmol/L   Chloride 105 101 - 111 mmol/L   CO2 28 22 - 32 mmol/L   Glucose, Bld 100 (H) 65 - 99 mg/dL   BUN 17 6 - 20 mg/dL   Creatinine, Ser 0.79 0.44 - 1.00 mg/dL   Calcium 9.3 8.9 - 10.3 mg/dL   GFR calc non Af Amer >60 >60 mL/min   GFR calc Af Amer >60 >60 mL/min   Anion gap 6 5 - 15  CBC WITH DIFFERENTIAL  Result Value Ref Range   WBC 3.5 (L) 4.0 - 10.5 K/uL   RBC 4.27 3.87 - 5.11 MIL/uL   Hemoglobin 12.1 12.0 - 15.0 g/dL   HCT 36.9 36.0 - 46.0 %   MCV 86.4 78.0 - 100.0 fL   MCH 28.3 26.0 - 34.0 pg   MCHC 32.8 30.0 - 36.0 g/dL   RDW 13.8 11.5 - 15.5 %   Platelets 183 150 - 400 K/uL   Neutrophils  Relative % 42 %   Neutro Abs 1.5 (L) 1.7 - 7.7 K/uL   Lymphocytes Relative 47 %   Lymphs Abs 1.6 0.7 - 4.0 K/uL   Monocytes Relative 8 %   Monocytes Absolute 0.3 0.1 - 1.0 K/uL   Eosinophils Relative 3 %   Eosinophils Absolute 0.1 0.0 - 0.7 K/uL   Basophils Relative 0 %   Basophils Absolute 0.0 0.0 - 0.1 K/uL  Protime-INR  Result Value Ref Range   Prothrombin Time 12.9 11.4 - 15.2 seconds   INR 0.97     Dg Chest 2 View  Result Date: 01/27/2016 CLINICAL DATA:  Preoperative for back surgery EXAM: CHEST  2 VIEW COMPARISON:  06/18/2011 chest radiograph. FINDINGS: Surgical clips overlying left axilla. Stable cardiomediastinal silhouette with top-normal heart size. No pneumothorax. No pleural effusion. No pulmonary edema. No acute consolidative airspace disease. Mild bibasilar scarring versus atelectasis. Cholecystectomy clips are seen in the right upper quadrant of the abdomen. IMPRESSION: Mild bibasilar scarring versus atelectasis. Otherwise no active disease in the chest. Electronically Signed   By: Ilona Sorrel M.D.   On: 01/27/2016 07:06   Dg Lumbar Spine 2-3 Views  Result Date: 01/27/2016 CLINICAL DATA:  L4-5 laminectomy. EXAM: LUMBAR SPINE - 2-3 VIEW COMPARISON:  CT 11/01/2015 FINDINGS: Using the same numbering scheme  as the myelogram, initial films shows a needle directed at the L3-4 disc level. Second film shows tissue spreaders posteriorly with a probe directed at the L4-5 level. IMPRESSION: L4-5 localized on the second film. Electronically Signed   By: Nelson Chimes M.D.   On: 01/27/2016 12:02    Antibiotics:  Anti-infectives    Start     Dose/Rate Route Frequency Ordered Stop   01/27/16 2100  vancomycin (VANCOCIN) IVPB 1000 mg/200 mL premix     1,000 mg 200 mL/hr over 60 Minutes Intravenous  Once 01/27/16 1235 01/27/16 2056   01/27/16 0939  bacitracin 50,000 Units in sodium chloride irrigation 0.9 % 500 mL irrigation  Status:  Discontinued       As needed 01/27/16 0939 01/27/16  1045   01/27/16 0600  vancomycin (VANCOCIN) 1,500 mg in sodium chloride 0.9 % 500 mL IVPB  Status:  Discontinued     1,500 mg 250 mL/hr over 120 Minutes Intravenous On call to O.R. 01/26/16 1353 01/26/16 1358   01/27/16 0600  vancomycin (VANCOCIN) 1,500 mg in sodium chloride 0.9 % 500 mL IVPB     1,500 mg 250 mL/hr over 120 Minutes Intravenous On call to O.R. 01/26/16 1358 01/27/16 1020      Discharge Exam: Blood pressure 105/61, pulse 60, temperature 98 F (36.7 C), temperature source Oral, resp. rate 18, height 5\' 3"  (1.6 m), weight 114.8 kg (253 lb), SpO2 100 %. Neurologic: Grossly normal Dressing dry  Discharge Medications:     Medication List    STOP taking these medications   cyclobenzaprine 10 MG tablet Commonly known as:  FLEXERIL   traMADol 50 MG tablet Commonly known as:  ULTRAM     TAKE these medications   acetaminophen 500 MG tablet Commonly known as:  TYLENOL Take 1,000 mg by mouth every 4 (four) hours as needed for mild pain or moderate pain.   anastrozole 1 MG tablet Commonly known as:  ARIMIDEX Take 1 tablet (1 mg total) by mouth daily.   benztropine 0.5 MG tablet Commonly known as:  COGENTIN Take 0.5 mg by mouth 2 (two) times daily.   cholecalciferol 400 units Tabs tablet Commonly known as:  VITAMIN D Take 400 Units by mouth daily.   DEXILANT 30 MG capsule Generic drug:  Dexlansoprazole Take 30 mg by mouth daily.   docusate sodium 100 MG capsule Commonly known as:  COLACE Take 1 capsule (100 mg total) by mouth every 12 (twelve) hours.   DULoxetine 60 MG capsule Commonly known as:  CYMBALTA Take 60 mg by mouth daily.   furosemide 40 MG tablet Commonly known as:  LASIX Take 40 mg by mouth daily.   HYDROcodone-acetaminophen 5-325 MG tablet Commonly known as:  NORCO/VICODIN Take 1-2 tablets by mouth every 6 (six) hours as needed for moderate pain (mild pain).   methocarbamol 500 MG tablet Commonly known as:  ROBAXIN Take 1 tablet (500 mg  total) by mouth every 6 (six) hours as needed for muscle spasms.   montelukast 10 MG tablet Commonly known as:  SINGULAIR Take 10 mg by mouth at bedtime.   potassium chloride SA 20 MEQ tablet Commonly known as:  K-DUR,KLOR-CON Take 20 mEq by mouth daily.   pregabalin 100 MG capsule Commonly known as:  LYRICA Take 1 capsule (100 mg total) by mouth 3 (three) times daily.       Disposition: Home   Final Dx: L4-L5 laminectomy with non-instrumented fusion  Discharge Instructions     Remove dressing in  72 hours    Complete by:  As directed   Call MD for:  difficulty breathing, headache or visual disturbances    Complete by:  As directed   Call MD for:  persistant nausea and vomiting    Complete by:  As directed   Call MD for:  redness, tenderness, or signs of infection (pain, swelling, redness, odor or green/yellow discharge around incision site)    Complete by:  As directed   Call MD for:  severe uncontrolled pain    Complete by:  As directed   Call MD for:  temperature >100.4    Complete by:  As directed   Diet - low sodium heart healthy    Complete by:  As directed   Discharge instructions    Complete by:  As directed   No heavy lifting or bending or twisting, may shower, no driving   Increase activity slowly    Complete by:  As directed         Signed: Earnest Thalman S 01/28/2016, 10:04 AM

## 2016-01-28 NOTE — Discharge Instructions (Signed)
Wound Care °Keep incision covered and dry for one week.  If you shower prior to then, cover incision with plastic wrap.  °You may remove outer bandage after one week and shower.  °Do not put any creams, lotions, or ointments on incision. °Leave steri-strips on neck.  They will fall off by themselves. °Activity °Walk each and every day, increasing distance each day. °No lifting greater than 5 lbs.  Avoid bending, arching, or twisting. °No driving for 2 weeks; may ride as a passenger locally. °If provided with back brace, wear when out of bed.  It is not necessary to wear in bed. °Diet °Resume your normal diet.  °Return to Work °Will be discussed at you follow up appointment. °Call Your Doctor If Any of These Occur °Redness, drainage, or swelling at the wound.  °Temperature greater than 101 degrees. °Severe pain not relieved by pain medication. °Incision starts to come apart. °Follow Up Appt °Call today for appointment in 1-2 weeks ([phone]) or for problems.  If you have any hardware placed in your spine, you will need an x-ray before your appointment. °

## 2016-01-30 ENCOUNTER — Encounter (HOSPITAL_COMMUNITY): Payer: Self-pay | Admitting: Neurological Surgery

## 2016-02-03 ENCOUNTER — Other Ambulatory Visit: Payer: Self-pay | Admitting: Physical Medicine & Rehabilitation

## 2016-02-03 MED FILL — CLINDAMYCIN HCL 300 MG CAPS: 300 | 5 days supply | Qty: 10 | Fill #0

## 2016-02-03 MED FILL — ANASTROZOLE 1 MG TABLET: 1 | 30 days supply | Qty: 30 | Fill #5

## 2016-02-16 ENCOUNTER — Other Ambulatory Visit: Payer: Self-pay | Admitting: Specialist

## 2016-02-16 ENCOUNTER — Ambulatory Visit
Admission: RE | Admit: 2016-02-16 | Discharge: 2016-02-16 | Disposition: A | Discharge status: Home / Self Care | Payer: Medicare Other | Source: Ambulatory Visit | Attending: Specialist | Admitting: Specialist

## 2016-02-16 DIAGNOSIS — R05 Cough: Secondary | ICD-10-CM

## 2016-02-16 DIAGNOSIS — R059 Cough, unspecified: Secondary | ICD-10-CM

## 2016-02-16 DIAGNOSIS — R0600 Dyspnea, unspecified: Secondary | ICD-10-CM

## 2016-02-16 IMAGING — CR DG CHEST 2V
2 series · 2 of 2 positions shown · non-contrast
Comparison: Chest x-ray of 01/27/2016

CLINICAL DATA: Cough, short of breath for 3 weeks, postop from back
surgery

EXAM:
CHEST  2 VIEW

[w chest pa]
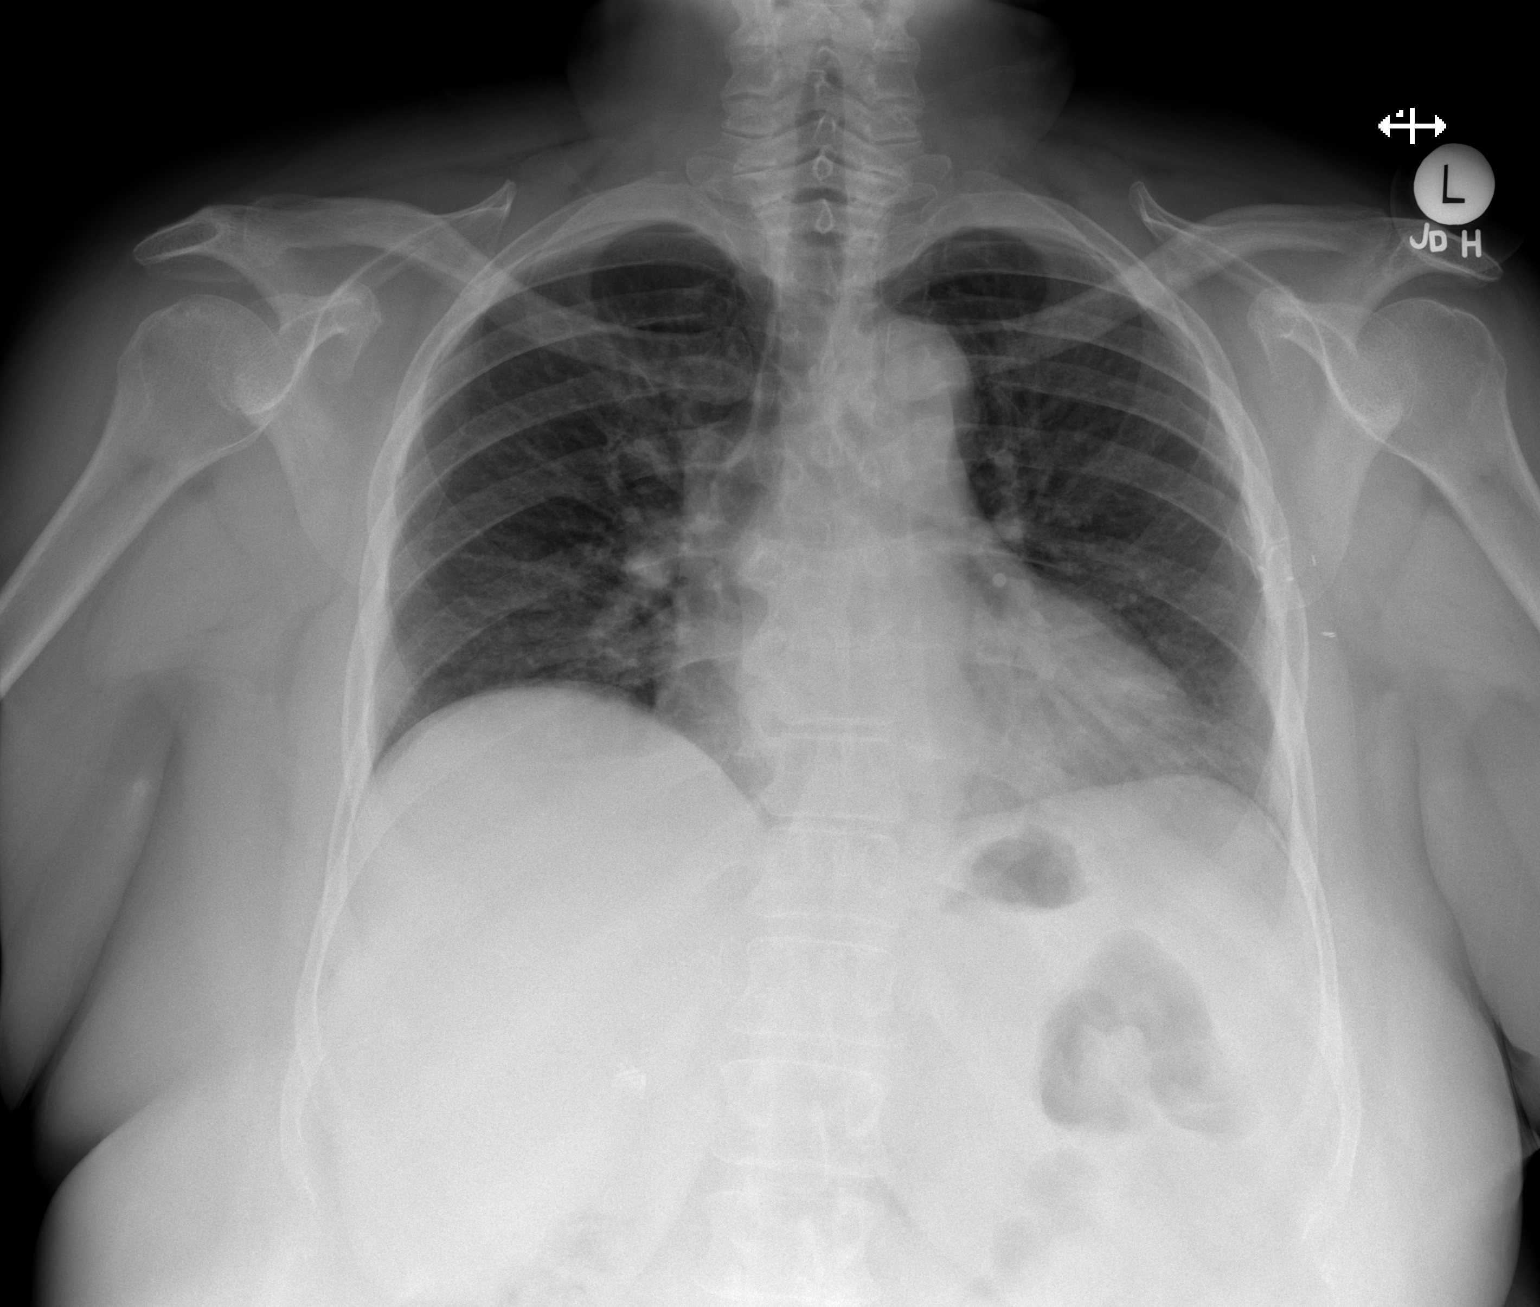

[w chest lat]
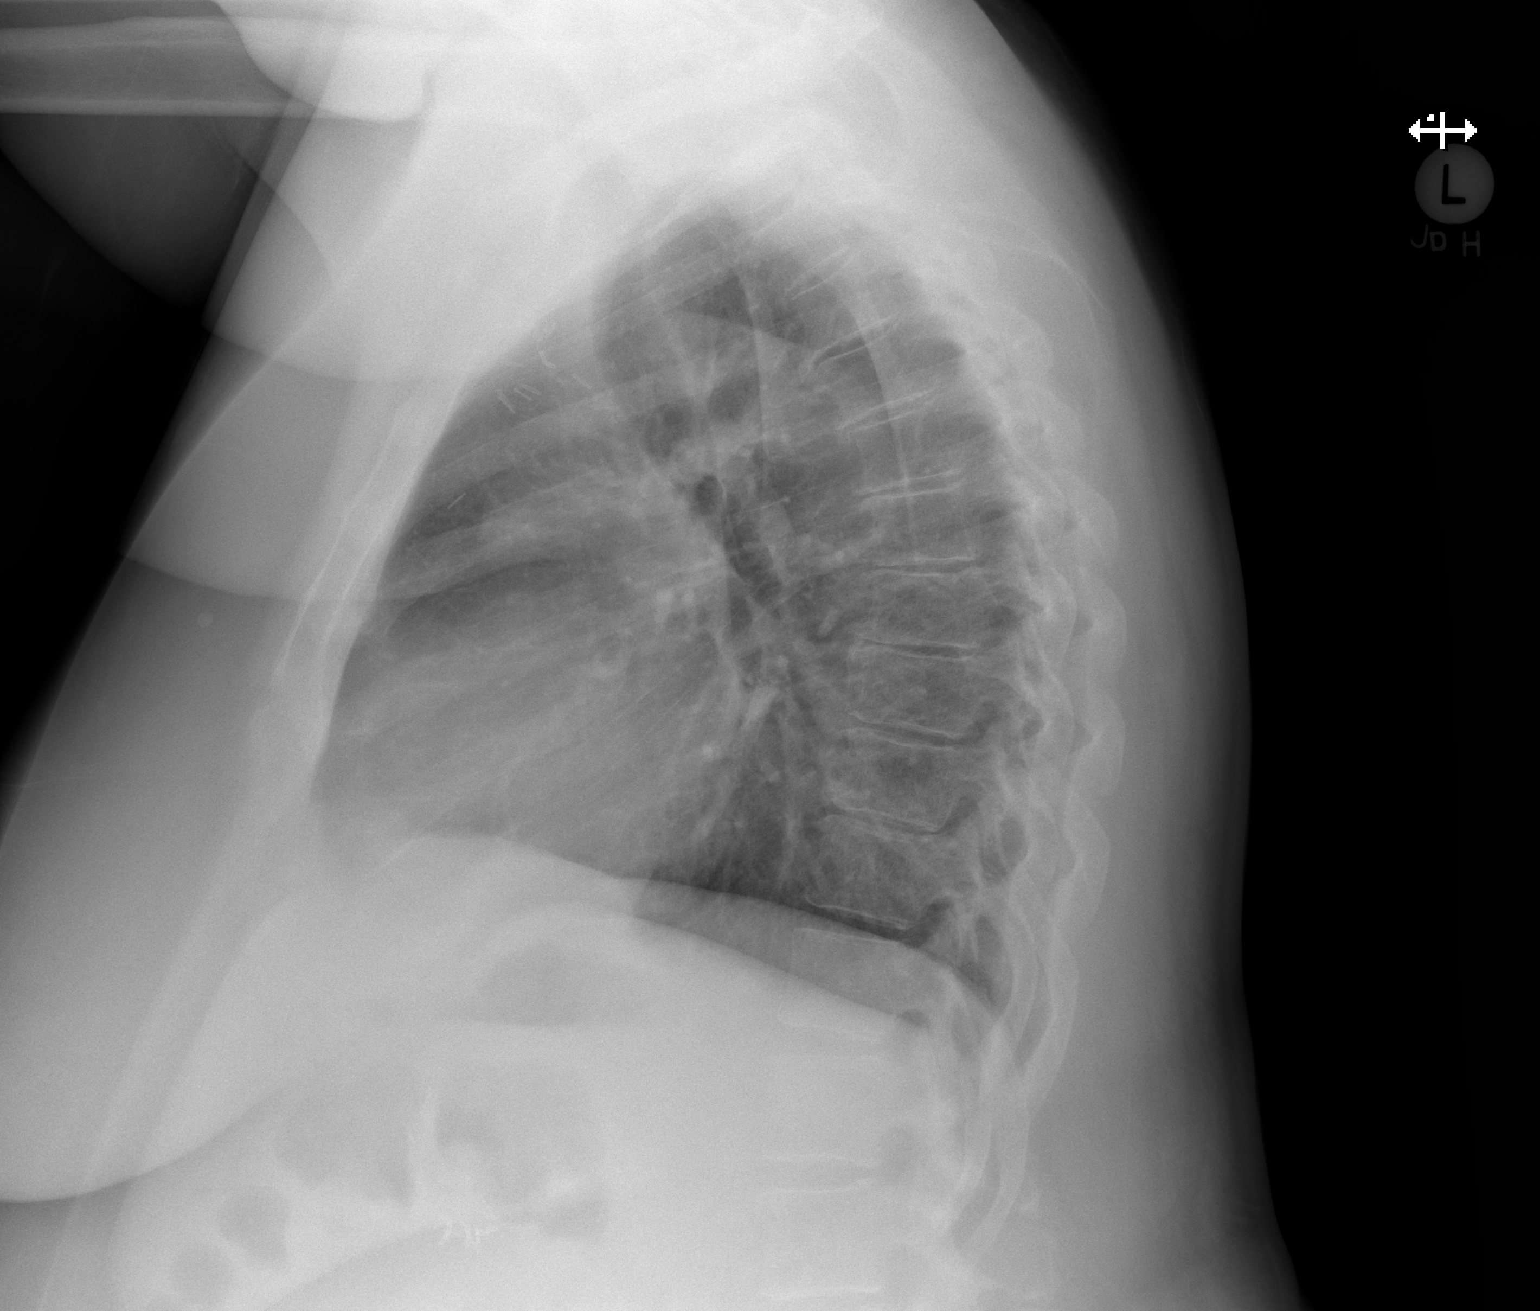

[2 of 2 positions shown; findings below may reference images not displayed]

FINDINGS: No active infiltrate or effusion is seen. Mediastinal and hilar
contours are unremarkable. Mild cardiomegaly is stable. There are
degenerative changes throughout the mid to lower thoracic spine.
IMPRESSION: Stable mild cardiomegaly.  No active lung disease.

## 2016-02-28 ENCOUNTER — Other Ambulatory Visit: Payer: Self-pay | Admitting: Specialist

## 2016-02-28 DIAGNOSIS — I517 Cardiomegaly: Secondary | ICD-10-CM

## 2016-03-02 MED FILL — traMADol HCL 50 MG TABS: 50 | 30 days supply | Qty: 120 | Fill #0

## 2016-03-02 MED FILL — LYRICA 100 MG CAPSULE: 100 | 30 days supply | Qty: 90 | Fill #0

## 2016-03-05 ENCOUNTER — Telehealth: Payer: Self-pay | Admitting: *Deleted

## 2016-03-05 DIAGNOSIS — C50412 Malignant neoplasm of upper-outer quadrant of left female breast: Secondary | ICD-10-CM

## 2016-03-05 MED ORDER — ANASTROZOLE 1 MG PO TABS: 1.0000 mg | ORAL_TABLET | Freq: Every day | ORAL | 3 refills | Status: DC

## 2016-03-05 MED FILL — ANASTROZOLE 1 MG TABLET: 1 | 30 days supply | Qty: 30 | Fill #0

## 2016-03-05 NOTE — Telephone Encounter (Signed)
"  The pharmacy faxed a refill request and have not received a response.  I took the last pill today.  I am here at the pharmacy now."  This nurse sent refill eRx to [facility].

## 2016-03-09 ENCOUNTER — Other Ambulatory Visit: Payer: Self-pay | Admitting: *Deleted

## 2016-03-09 DIAGNOSIS — C50412 Malignant neoplasm of upper-outer quadrant of left female breast: Secondary | ICD-10-CM

## 2016-03-09 MED ORDER — ANASTROZOLE 1 MG PO TABS: 1.0000 mg | ORAL_TABLET | Freq: Every day | ORAL | 3 refills | Status: DC

## 2016-03-14 ENCOUNTER — Encounter (HOSPITAL_COMMUNITY): Payer: Self-pay | Admitting: Radiology

## 2016-03-14 ENCOUNTER — Telehealth (HOSPITAL_COMMUNITY): Payer: Self-pay | Admitting: *Deleted

## 2016-03-14 NOTE — CV Procedure (Signed)
Patient was unable to walk on treadmill for ordered stress echo. Called Dr. Alphonzo Grieve to change order to Dobutamine stress echo. Spoke with echo reading physician to make sure Dobutamine stress test was an appropriate test for patient (per protocol-when dobutamine is ordered by non-Cardiologist). Dr. Marlou Porch felt nuclear stress testing would be more appropriate for patient. I called and spoke with Dr. Ailene Rud extender, stating we would be cancelling the test and that Dr. Marlou Porch felt a nuclear test would be more appropriate if clinically indicated. I stated once she would received the order she would need to call back and reschedule the patient and she would needed to contact the patient regarding testing cancellation.

## 2016-03-14 NOTE — Telephone Encounter (Signed)
Attempted reaching patient to cancel dobutamine stress echo.  Patient did not answer and her voice mail box has not been set up.

## 2016-03-15 ENCOUNTER — Encounter (HOSPITAL_COMMUNITY): Payer: Self-pay | Admitting: Radiology

## 2016-03-15 ENCOUNTER — Other Ambulatory Visit (HOSPITAL_COMMUNITY): Payer: Medicare Other

## 2016-03-15 NOTE — CV Procedure (Signed)
Arman Filter, Dr. Ailene Rud extender

## 2016-03-26 ENCOUNTER — Telehealth (HOSPITAL_COMMUNITY): Payer: Self-pay | Admitting: *Deleted

## 2016-03-26 NOTE — Telephone Encounter (Signed)
Patient given detailed instructions per Myocardial Perfusion Study Information Sheet for the test on 03/28/16 at 1230. Patient notified to arrive 15 minutes early and that it is imperative to arrive on time for appointment to keep from having the test rescheduled.  If you need to cancel or reschedule your appointment, please call the office within 24 hours of your appointment. Failure to do so may result in a cancellation of your appointment, and a $50 no show fee. Patient verbalized understanding.Kelsey Wilson, [name]

## 2016-03-28 ENCOUNTER — Ambulatory Visit (HOSPITAL_COMMUNITY): Payer: Medicare Other | Attending: Specialist

## 2016-03-28 DIAGNOSIS — I517 Cardiomegaly: Secondary | ICD-10-CM | POA: Insufficient documentation

## 2016-03-28 IMAGING — NM NM MISC PROCEDURE
9 series · 54 of 54 positions shown · non-contrast
Comparison: none

[Series 1: stress · 6.51mm/px · 6 of 512 frames shown (1 of 2)]
[frame 43/512]
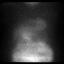
[frame 128/512]
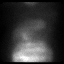
[frame 214/512]
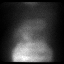
[frame 299/512]
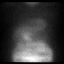
[frame 384/512]
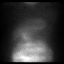
[frame 470/512]
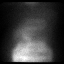

[Series 1: wbr_s-proj_st stress_(id)_sa · 6.5mm · 6.51mm/px · 6 of 512 frames shown (1 of 2)]
[frame 43/512]
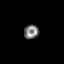
[frame 128/512]
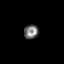
[frame 214/512]
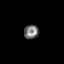
[frame 299/512]
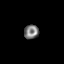
[frame 384/512]
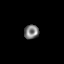
[frame 470/512]
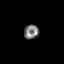

[Series 1: wbr_r-proj_st rest_(id)_sa · 6.5mm · 6.51mm/px · 6 of 64 frames shown]
[frame 6/64]
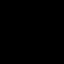
[frame 16/64]
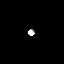
[frame 27/64]
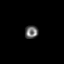
[frame 38/64]
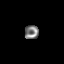
[frame 48/64]
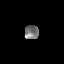
[frame 59/64]
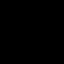

[Series 1: stress · 6.51mm/px · 6 of 64 frames shown (2 of 2)]
[frame 6/64]
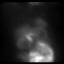
[frame 16/64]
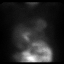
[frame 27/64]
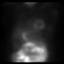
[frame 38/64]
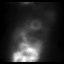
[frame 48/64]
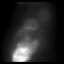
[frame 59/64]
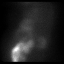

[Series 1: wbr_s-proj_st stress_(id)_sa · 6.5mm · 6.51mm/px · 6 of 64 frames shown (2 of 2)]
[frame 6/64]
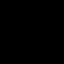
[frame 16/64]
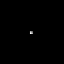
[frame 27/64]
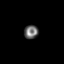
[frame 38/64]
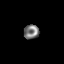
[frame 48/64]
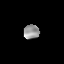
[frame 59/64]
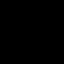

[Series 1: wbr_s-proj_st stress · 6.51mm/px · 6 of 64 frames shown (1 of 2)]
[frame 6/64]
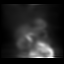
[frame 16/64]
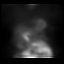
[frame 27/64]
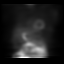
[frame 38/64]
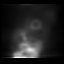
[frame 48/64]
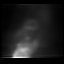
[frame 59/64]
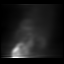

[Series 1: wbr_s-proj_st stress · 6.51mm/px · 6 of 512 frames shown (2 of 2)]
[frame 43/512]
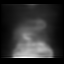
[frame 128/512]
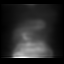
[frame 214/512]
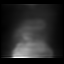
[frame 299/512]
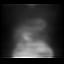
[frame 384/512]
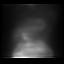
[frame 470/512]
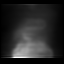

[Series 2: rest · 6.51mm/px · 6 of 64 frames shown]
[frame 6/64]
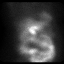
[frame 16/64]
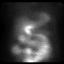
[frame 27/64]
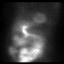
[frame 38/64]
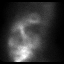
[frame 48/64]
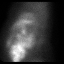
[frame 59/64]
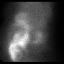

[Series 2: wbr_r-proj_st rest · 6.51mm/px · 6 of 64 frames shown]
[frame 6/64]
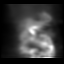
[frame 16/64]
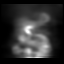
[frame 27/64]
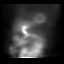
[frame 38/64]
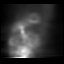
[frame 48/64]
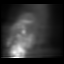
[frame 59/64]
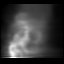

[54 of 54 positions shown; findings below may reference images not displayed]

Canned report from images found in remote index.

Refer to host system for actual result text.

## 2016-03-28 MED ORDER — TECHNETIUM TC 99M TETROFOSMIN IV KIT
32.8000 | PACK | Freq: Once | INTRAVENOUS | Status: AC | PRN
  Administered 2016-03-28: 32.8 via INTRAVENOUS
  Filled 2016-03-28: qty 33

## 2016-03-28 MED ORDER — REGADENOSON 0.4 MG/5ML IV SOLN
0.4000 mg | Freq: Once | INTRAVENOUS | Status: AC
  Administered 2016-03-28: 0.4 mg via INTRAVENOUS

## 2016-03-29 ENCOUNTER — Ambulatory Visit (HOSPITAL_COMMUNITY): Payer: Medicare Other | Attending: Cardiology

## 2016-03-29 LAB — MYOCARDIAL PERFUSION IMAGING
LV dias vol: 113 mL (ref 46–106)
LV sys vol: 40 mL
Peak HR: 92 {beats}/min
RATE: 0.32
Rest HR: 72 {beats}/min
SDS: 1
SRS: 0
SSS: 1
TID: 0.87

## 2016-03-29 MED ORDER — TECHNETIUM TC 99M TETROFOSMIN IV KIT
32.5000 | PACK | Freq: Once | INTRAVENOUS | Status: AC | PRN
  Administered 2016-03-29: 32.5 via INTRAVENOUS
  Filled 2016-03-29: qty 33

## 2016-04-04 MED FILL — ANASTROZOLE 1 MG TABLET: 1 | 30 days supply | Qty: 30 | Fill #1

## 2016-04-06 ENCOUNTER — Encounter: Payer: Self-pay | Admitting: Podiatry

## 2016-04-06 ENCOUNTER — Ambulatory Visit (INDEPENDENT_AMBULATORY_CARE_PROVIDER_SITE_OTHER): Payer: Medicare Other | Admitting: Podiatry

## 2016-04-06 DIAGNOSIS — B351 Tinea unguium: Secondary | ICD-10-CM

## 2016-04-06 DIAGNOSIS — M79676 Pain in unspecified toe(s): Secondary | ICD-10-CM

## 2016-04-06 NOTE — Progress Notes (Signed)
Subjective:     Patient ID: Kelsey Wilson, female   DOB: 1961/03/05, 55 y.o.   MRN: [id number]  HPIThis patient returns for nail care both feet.  She admits to having right knee treatment prior to visit.  She states she has pain through bottom of both feet which were treated plantar fascial brace which helped some.  Peripheral neuropathy. She says her nails all fell off during chemo except second toenail right which is thick and painful.   Review of Systems     Objective:   Physical Exam GENERAL APPEARANCE: Alert, conversant. Appropriately groomed. No acute distress.  VASCULAR: Pedal pulses palpable at 2/4 DP and PT bilateral.  Capillary refill time is immediate to all digits,  Proximal to distal cooling it warm to warm.  Digital hair growth is present bilateral  NEUROLOGIC: sensation is intact epicritically and protectively to 5.07 monofilament at 5/5 sites bilateral.  Light touch is intact bilateral, vibratory sensation intact bilateral, achilles tendon reflex is intact bilateral.  MUSCULOSKELETAL: acceptable muscle strength, tone and stability bilateral.  Intrinsic muscluature intact bilateral.  Rectus appearance of foot and digits noted bilateral.   Plantar fascia painful  upon palpation B/L  DERMATOLOGIC: skin color, texture, and turgor are within normal limits.  No preulcerative lesions or ulcers  are seen, no interdigital maceration noted.  No open lesions present.  . No drainage noted. NAILS  Thick disfigured discolored nails both feet x 10      Assessment:     Onychomycosis    Plan:  Debridement of onychomycotic nails.  RTC  3 months. Discussed permanent removal of second toe right foot.   To schedule in future.   Gardiner Barefoot DPM

## 2016-04-24 ENCOUNTER — Other Ambulatory Visit: Payer: Self-pay | Admitting: Oncology

## 2016-04-24 ENCOUNTER — Other Ambulatory Visit: Payer: Self-pay | Admitting: Nurse Practitioner

## 2016-04-24 DIAGNOSIS — Z853 Personal history of malignant neoplasm of breast: Secondary | ICD-10-CM

## 2016-04-24 MED FILL — LYRICA 100 MG CAPSULE: 100 | 30 days supply | Qty: 90 | Fill #1

## 2016-04-30 MED FILL — ANASTROZOLE 1 MG TABLET: 1 | 30 days supply | Qty: 30 | Fill #2

## 2016-05-01 ENCOUNTER — Encounter: Payer: Self-pay | Admitting: Nurse Practitioner

## 2016-05-08 ENCOUNTER — Encounter: Payer: Self-pay | Admitting: Nurse Practitioner

## 2016-05-08 ENCOUNTER — Encounter (INDEPENDENT_AMBULATORY_CARE_PROVIDER_SITE_OTHER): Payer: Self-pay

## 2016-05-08 ENCOUNTER — Ambulatory Visit (INDEPENDENT_AMBULATORY_CARE_PROVIDER_SITE_OTHER): Payer: Medicare Other | Admitting: Nurse Practitioner

## 2016-05-08 VITALS — BP 128/76 | HR 82 | Ht 63.0 in | Wt 260.1 lb

## 2016-05-08 DIAGNOSIS — R1010 Upper abdominal pain, unspecified: Secondary | ICD-10-CM

## 2016-05-08 DIAGNOSIS — R112 Nausea with vomiting, unspecified: Secondary | ICD-10-CM

## 2016-05-08 NOTE — Progress Notes (Signed)
HPI: Patient is a 55 yo female known to Dr. Loletha Carrow. She had a screening colonoscopy by him in March of this year. Mild diverticulosis was found and a small adenomatous polyp removed from the rectum. Patient has a history of breast cancer, on Arimidex. 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 She is s/p cholecystectomy, appendectomy, and hysterectomy.   Patient referred by Dr. Lillia Corporal for evaluation of abdominal pain. The non-radiating pain is diffuse involving mid and upper abdomen. It is not related to eating. She has occasional nausea / vomiting. No weight loss and BMs normal on stool softeners. PCP gave her Ultram ER 100mg  for the pain but it was insufficient so recently prescribed Tylenol #4.   Patient has chronic abdominal pain. Several CT scans for abdominal pain dating back to 2005 have been unremarkable except for the one in 2007 revealing appendicitis for which patient underwent appendectomy. Llaboratory studies have been unremarkable  Her most recent imaging included CTscan of the chest, pelvis and abdomen done 03/13/16 for evaluation of chest / abdominal pain and hx of breast cancer. Findings included cholecystectomy, mild dilation of the CBD and mild intrahepatic biliary dilation without obstruction ( unchanged). Also, splenorenal varices / shunt similar to previous. 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 A 63mm right renal lesion was unchanged.    Kelsey Wilson has a hx of GERD. She has occasional breakthrough symptoms on Dexilant.   Past Medical History:  Diagnosis Date  . Anemia   . Anxiety   . Arthritis   . Breast cancer ([place]) 2010   T3N1 invasive ductal carcinoma left breast.Takes Arimidex daily  . Bursitis   . Carpal tunnel syndrome   . Chronic back pain    stenosis  . Constipation    takes Colace daily  . Depression    takes Benzotropine daily  . Diverticulitis of colon   . Fibromyalgia 08/2012  . GERD  (gastroesophageal reflux disease)    takes Dexilant daily  . Hemorrhoid   . History of blood transfusion    no abnormal reaction noted  . History of colon polyps    benign  . History of shingles   . Night muscle spasms    takes Flexeril nightly as needed  . Nocturia   . Peripheral edema    takes Furosemide.Just started 01/18/16  . Peripheral neuropathy (HCC)    takes Lyrica daily  . Pneumonia    hx of-2015  . Seasonal allergies    takes Singulair nightly  . SOB (shortness of breath) on exertion    rarely with exertion   Colonoscopy March 2017, Dr. Loletha Carrow. Done for screening. Cecum reached. Findings: mild left sided diverticulosis and small rectal polyp. Polyp path = adenoma  Patient's surgical history, family medical history, social history, medications and allergies were all reviewed in Epic   Physical Exam: BP 128/76   Pulse 82   Ht 5\' 3"  (1.6 m)   Wt 260 lb 2 oz (118 kg)   BMI 46.08 kg/m   GENERAL: obese black female in NAD PSYCH: :Pleasant, cooperative, normal affect HEENT: Normocephalic, conjunctiva pink, mucous membranes moist, neck supple without masses CARDIAC:  RRR, no murmur heard, no peripheral edema PULM: Normal respiratory effort, lungs CTA bilaterally, no wheezing ABDOMEN:  soft, nontender, nondistended, no obvious masses, no hepatomegaly,  normal bowel sounds SKIN:  turgor, no lesions seen Musculoskeletal:  Normal  muscle tone, normal strength NEURO: Alert and oriented x 3, no  focal neurologic deficits   ASSESSMENT and PLAN:  55 year old female with unexplained chronic mid / upper abdominal pain.  CTscans / labs have been unrevealing, last one with mild and stable intrahepatic and CBD dilation in s/p cholecystectomy state. Possible partial occlusion of SV seen for which patient is on coumadin.  Now needing Tylenol with codeine for the pain. -Can't say that partial occlusion of SV is causing abdominal pain but seems unlikely.  -Already on a PPI. Upper GI  tract source of pain seems unlikely but should be excluded for completeness. The risks and benefits of EGD were discussed and the patient agrees to proceed.  -Will need to hold pradaxa for procedure. See below.   GERD, asymptomatic on Dexilant for the most part.   Partial SV thrombosis, on pradaxa. Hold pradaxa 2 days before procedure - will instruct when and how to resume after procedure. Low but real risk of cardiovascular event such as heart attack, stroke, embolism, thrombosis or ischemia/infarct of other organs off Pradaxa explained to the patient and she consents to proceed. Will communicate by phone or EMR with patient's prescribing provider to confirm that holding Pradaxa is reasonable in this case.     Kelsey Wilson  05/08/2016, 8:46 AM  Lillia Corporal, MD

## 2016-05-08 NOTE — Patient Instructions (Addendum)

## 2016-05-09 ENCOUNTER — Encounter: Payer: Self-pay | Admitting: Vascular Surgery

## 2016-05-09 ENCOUNTER — Encounter (HOSPITAL_COMMUNITY): Payer: Self-pay

## 2016-05-09 ENCOUNTER — Encounter (HOSPITAL_COMMUNITY)
Admission: RE | Admit: 2016-05-09 | Discharge: 2016-05-09 | Disposition: A | Discharge status: Home / Self Care | Payer: Medicare Other | Source: Ambulatory Visit | Attending: Oral Surgery | Admitting: Oral Surgery

## 2016-05-09 DIAGNOSIS — Z7901 Long term (current) use of anticoagulants: Secondary | ICD-10-CM | POA: Diagnosis not present

## 2016-05-09 DIAGNOSIS — Z885 Allergy status to narcotic agent status: Secondary | ICD-10-CM | POA: Diagnosis not present

## 2016-05-09 DIAGNOSIS — F329 Major depressive disorder, single episode, unspecified: Secondary | ICD-10-CM | POA: Diagnosis not present

## 2016-05-09 DIAGNOSIS — Z8719 Personal history of other diseases of the digestive system: Secondary | ICD-10-CM | POA: Diagnosis not present

## 2016-05-09 DIAGNOSIS — G8929 Other chronic pain: Secondary | ICD-10-CM | POA: Diagnosis not present

## 2016-05-09 DIAGNOSIS — Z888 Allergy status to other drugs, medicaments and biological substances status: Secondary | ICD-10-CM | POA: Diagnosis not present

## 2016-05-09 DIAGNOSIS — K029 Dental caries, unspecified: Secondary | ICD-10-CM | POA: Diagnosis not present

## 2016-05-09 DIAGNOSIS — G629 Polyneuropathy, unspecified: Secondary | ICD-10-CM | POA: Diagnosis not present

## 2016-05-09 DIAGNOSIS — M1990 Unspecified osteoarthritis, unspecified site: Secondary | ICD-10-CM | POA: Diagnosis not present

## 2016-05-09 DIAGNOSIS — Z886 Allergy status to analgesic agent status: Secondary | ICD-10-CM | POA: Diagnosis not present

## 2016-05-09 DIAGNOSIS — Z880 Allergy status to penicillin: Secondary | ICD-10-CM | POA: Diagnosis not present

## 2016-05-09 DIAGNOSIS — R600 Localized edema: Secondary | ICD-10-CM | POA: Diagnosis not present

## 2016-05-09 DIAGNOSIS — K219 Gastro-esophageal reflux disease without esophagitis: Secondary | ICD-10-CM | POA: Diagnosis not present

## 2016-05-09 DIAGNOSIS — F419 Anxiety disorder, unspecified: Secondary | ICD-10-CM | POA: Diagnosis not present

## 2016-05-09 DIAGNOSIS — M278 Other specified diseases of jaws: Secondary | ICD-10-CM | POA: Diagnosis not present

## 2016-05-09 DIAGNOSIS — I739 Peripheral vascular disease, unspecified: Secondary | ICD-10-CM | POA: Diagnosis not present

## 2016-05-09 DIAGNOSIS — M797 Fibromyalgia: Secondary | ICD-10-CM | POA: Diagnosis not present

## 2016-05-09 DIAGNOSIS — D649 Anemia, unspecified: Secondary | ICD-10-CM | POA: Diagnosis not present

## 2016-05-09 DIAGNOSIS — Z86010 Personal history of colonic polyps: Secondary | ICD-10-CM | POA: Diagnosis not present

## 2016-05-09 DIAGNOSIS — Z6841 Body Mass Index (BMI) 40.0 and over, adult: Secondary | ICD-10-CM | POA: Diagnosis not present

## 2016-05-09 DIAGNOSIS — Z853 Personal history of malignant neoplasm of breast: Secondary | ICD-10-CM | POA: Diagnosis not present

## 2016-05-09 HISTORY — DX: Other mechanical complication of surgically created arteriovenous shunt, initial encounter: T82.591A

## 2016-05-09 LAB — CBC
HCT: 35.8 % — ABNORMAL LOW (ref 36.0–46.0)
Hemoglobin: 11.9 g/dL — ABNORMAL LOW (ref 12.0–15.0)
MCH: 28 pg (ref 26.0–34.0)
MCHC: 33.2 g/dL (ref 30.0–36.0)
MCV: 84.2 fL (ref 78.0–100.0)
Platelets: 183 10*3/uL (ref 150–400)
RBC: 4.25 MIL/uL (ref 3.87–5.11)
RDW: 13.6 % (ref 11.5–15.5)
WBC: 4 10*3/uL (ref 4.0–10.5)

## 2016-05-09 LAB — BASIC METABOLIC PANEL
Anion gap: 11 (ref 5–15)
BUN: 13 mg/dL (ref 6–20)
CO2: 29 mmol/L (ref 22–32)
Calcium: 9.3 mg/dL (ref 8.9–10.3)
Chloride: 99 mmol/L — ABNORMAL LOW (ref 101–111)
Creatinine, Ser: 0.64 mg/dL (ref 0.44–1.00)
GFR calc Af Amer: >60 mL/min (ref >60)
GFR calc non Af Amer: >60 mL/min (ref >60)
Glucose, Bld: 146 mg/dL — ABNORMAL HIGH (ref 65–99)
Potassium: 3 mmol/L — ABNORMAL LOW (ref 3.5–5.1)
Sodium: 139 mmol/L (ref 135–145)

## 2016-05-09 NOTE — Progress Notes (Signed)
   05/09/16 1446  OBSTRUCTIVE SLEEP APNEA  Have you ever been diagnosed with sleep apnea through a sleep study? No  Do you snore loudly (loud enough to be heard through closed doors)?  1  Do you often feel tired, fatigued, or sleepy during the daytime (such as falling asleep during driving or talking to someone)? 1  Has anyone observed you stop breathing during your sleep? 1  Do you have, or are you being treated for high blood pressure? 0  BMI more than 35 kg/m2? 1  Age > 50 (1-yes) 1  Neck circumference greater than:Female 16 inches or larger, Female 17inches or larger? 0  Female Gender (Yes=1) 0  Obstructive Sleep Apnea Score 5  Score 5 or greater  Results sent to PCP

## 2016-05-09 NOTE — H&P (Signed)
HISTORY AND PHYSICAL  Kelsey Wilson is a 55 y.o. female patient with CC: painful teeth.   No diagnosis found.  Past Medical History:  Diagnosis Date  . Anemia   . Anxiety   . Arthritis   . Breast cancer ([place]) 2010   T3N1 invasive ductal carcinoma left breast.Takes Arimidex daily  . Bursitis   . Carpal tunnel syndrome   . Chronic back pain    stenosis  . Constipation    takes Colace daily  . Depression    takes Benzotropine daily  . Diverticulitis of colon   . Fibromyalgia 08/2012  . GERD (gastroesophageal reflux disease)    takes Dexilant daily  . Hemorrhoid   . History of blood transfusion    no abnormal reaction noted  . History of colon polyps    benign  . History of shingles   . Joint pain   . Joint swelling   . Night muscle spasms    takes Flexeril nightly as needed  . Nocturia   . Peripheral edema    takes Furosemide.Just started 01/18/16  . Peripheral neuropathy (HCC)    takes Lyrica daily  . Pneumonia    hx of-2015  . Seasonal allergies    takes Singulair nightly  . SOB (shortness of breath) on exertion    rarely with exertion    No current facility-administered medications for this encounter.    Current Outpatient Prescriptions  Medication Sig Dispense Refill  . acetaminophen (TYLENOL) 500 MG tablet Take 500 mg by mouth every 4 (four) hours as needed for mild pain or moderate pain.     Marland Kitchen anastrozole (ARIMIDEX) 1 MG tablet Take 1 tablet (1 mg total) by mouth daily. 30 tablet 3  . benzonatate (TESSALON) 100 MG capsule take 1 capsule by mouth four times a day if needed for cough  0  . benztropine (COGENTIN) 0.5 MG tablet Take 0.5 mg by mouth 2 (two) times daily.    . clindamycin (CLEOCIN) 300 MG capsule Take 600 mg by mouth once. Takes 2 hours before dental procedures  0  . cyclobenzaprine (FLEXERIL) 10 MG tablet Take 10 mg by mouth 2 (two) times daily.  0  . docusate sodium (COLACE) 100 MG capsule Take 1 capsule (100 mg total) by mouth every 12  (twelve) hours. (Patient taking differently: Take 100 mg by mouth daily after breakfast. ) 60 capsule 0  . LYRICA 100 MG capsule TAKE 1 CAPSULE BY MOUTH 3 TIMES DAILY 90 capsule 5  . metolazone (ZAROXOLYN) 2.5 MG tablet Take 2.5 mg by mouth daily.  0  . montelukast (SINGULAIR) 10 MG tablet Take 10 mg by mouth at bedtime.    . potassium chloride SA (K-DUR,KLOR-CON) 20 MEQ tablet Take 20 mEq by mouth daily.    Marland Kitchen PRADAXA 150 MG CAPS capsule Take 150 mg by mouth daily. Will stop 3 days prior to procedure  0  . traMADol (ULTRAM) 50 MG tablet Take 100 mg by mouth 3 (three) times daily as needed for moderate pain.    . cholecalciferol (VITAMIN D) 1000 units tablet Take 1,000 Units by mouth daily. Takes 4,000 units daily    . Dexlansoprazole (DEXILANT) 30 MG capsule Take 30 mg by mouth daily.    . promethazine (PHENERGAN) 25 MG tablet Take 25 mg by mouth every 4 (four) hours as needed for nausea or vomiting.     Allergies  Allergen Reactions  . Oxycodone Other (See Comments)    hallucinations  . Aleve [  Naproxen] Nausea Only  . Compazine [Prochlorperazine] Other (See Comments)    Numbness of face and  lips  . Penicillins Nausea Only    Has patient had a PCN reaction causing immediate rash, facial/tongue/throat swelling, SOB or lightheadedness with hypotension: No Has patient had a PCN reaction causing severe rash involving mucus membranes or skin necrosis: No Has patient had a PCN reaction that required hospitalization No Has patient had a PCN reaction occurring within the last 10 years: No If all of the above answers are "NO", then may proceed with Cephalosporin use.    Active Problems:   * No active hospital problems. *  Vitals: There were no vitals taken for this visit. Lab results:No results found for this or any previous visit (from the past 32 hour(s)). Radiology Results: No results found. General appearance: alert, cooperative, no distress and morbidly obese Head: Normocephalic,  without obvious abnormality, atraumatic Eyes: negative Nose: Nares normal. Septum midline. Mucosa normal. No drainage or sinus tenderness. Throat: Dental caries teeth # 18, 20, 29. Mandibular torus and exostosis. Pharynx clear. Neck: no adenopathy, supple, symmetrical, trachea midline and thyroid not enlarged, symmetric, no tenderness/mass/nodules Resp: clear to auscultation bilaterally Cardio: regular rate and rhythm, S1, S2 normal, no murmur, click, rub or gallop  Assessment: nonrestorable teeth 18, 20, 29, mandibular torus, exostosis  Plan: Extraction teeth 18, 20, 29, removal torus, exostosis. GA, Day surgery.   Sarra Rachels M 05/09/2016

## 2016-05-09 NOTE — Pre-Procedure Instructions (Signed)
Kelsey Wilson  05/09/2016      [place], [place] - [place] [address] Phone: [phone] Fax: [phone]  [facility] [address] [place], [place] - [place] [address] Phone: [phone] Fax: [phone]    Your procedure is scheduled on Dec. 8  Report to [facility] at 800 A.M.  Call this number if you have problems the morning of surgery:  [phone]   Remember:  Do not eat food or drink liquids after midnight.  Take these medicines the morning of surgery with A SIP OF WATER Tylenol if needed, anastrozole (Arimidex), Benztropine (Cogentin), Clindamycin (Cleocin), Flexeril if needed, Dexlansoprazole (Dexilant), Lyrica, tramadol (Ultram) if needed  Stop taking aspirin, BC's, Goody's, Herbal medications, fish oil, Ibuprofen, Advil, Motrin, Aleve, vitamins Stop Pradaxa as directed by your Dr.   Lazaro Arms not wear jewelry, make-up or nail polish.  Do not wear lotions, powders, or perfumes, or deoderant.  Do not shave 48 hours prior to surgery.  Men may shave face and neck.  Do not bring valuables to the hospital.  [facility] is not responsible for any belongings or valuables.  Contacts, dentures or bridgework may not be worn into surgery.  Leave your suitcase in the car.  After surgery it may be brought to your room.  For patients admitted to the hospital, discharge time will be determined by your treatment team.  Patients discharged the day of surgery will not be allowed to drive home.  Special instructions:  [facility] - Preparing for Surgery  Before surgery, you can play an important role.  Because skin is not sterile, your skin needs to be as free of germs as possible.  You can reduce the number of germs on you skin by washing with CHG (chlorahexidine gluconate) soap before surgery.  CHG is an antiseptic cleaner which  kills germs and bonds with the skin to continue killing germs even after washing.  Please DO NOT use if you have an allergy to CHG or antibacterial soaps.  If your skin becomes reddened/irritated stop using the CHG and inform your nurse when you arrive at Short Stay.  Do not shave (including legs and underarms) for at least 48 hours prior to the first CHG shower.  You may shave your face.  Please follow these instructions carefully:   1.  Shower with CHG Soap the night before surgery and the   morning of Surgery.  2.  If you choose to wash your hair, wash your hair first as usual with your  normal shampoo.  3.  After you shampoo, rinse your hair and body thoroughly to remove the Shampoo.  4.  Use CHG as you would any other liquid soap.  You can apply chg directly  to the skin and wash gently with scrungie or a clean washcloth.  5.  Apply the CHG Soap to your body ONLY FROM THE NECK DOWN.      Do not use on open wounds or open sores.  Avoid contact with your eyes,       ears, mouth and genitals (private parts).  Wash genitals (private parts)  with your normal soap.  6.  Wash thoroughly, paying special attention to the area where your surgery   will be performed.  7.  Thoroughly rinse your body with warm water from the neck down.  8.  DO NOT shower/wash with  your normal soap after using and rinsing off  the CHG Soap.  9.  Pat yourself dry with a clean towel.            10.  Wear clean pajamas.            11.  Place clean sheets on your bed the night of your first shower and do not  sleep with pets.  Day of Surgery  Do not apply any lotions/deoderants the morning of surgery.  Please wear clean clothes to the hospital/surgery center.     Please read over the following fact sheets that you were given. Coughing and Deep Breathing and Surgical Site Infection Prevention

## 2016-05-09 NOTE — Progress Notes (Addendum)
PCP is Dr Alphonzo Grieve Denies seeing a cardiologist. States she had a stress test 2017 request sent to Dr Alphonzo Grieve. Echo noted from 2013

## 2016-05-10 ENCOUNTER — Encounter (HOSPITAL_COMMUNITY): Payer: Self-pay

## 2016-05-10 NOTE — Progress Notes (Addendum)
Anesthesia Chart Review: Patient is a 55 year old female scheduled for multiple teeth extractions with alveoplasty on 05/11/16 by Dr. Hoyt Koch.  History includes non-smoker, breast cancer s/p left breast lumpectomy and chemoradiation (right Port-a-cath, removed '14) '13, chronic LUE lymphedema, anemia, fibromyalgia, exertional dyspnea, edema, peripheral neuropathy, depression, chronic back pain, GERD, diverticulitis, anxiety, bilateral TKA '16, L4-5 PLIF, appendectomy, tonsillectomy. She was started on Pradaxa by Dr. 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 BMI is consistent with morbid obesity. OSA screening score was 5.   PCP is Dr. Alphonzo Grieve. He ordered a recent stress test to evaluate for chest pain. Results showed no evidence of ischemia or infarct.   GI is Dr. Loletha Carrow. HEM-ONC is Dr. Jana Hakim.  Meds include Arimidex, Cogentin, Flexeril, Dexilant, Cymbalta, Lyrica, Zaroxolyn, Singulair, KCl, Dexilant, Phenergan, tramadol, clindamycin (before dental procedures). Patient reports last Pradaxa dose 05/06/16.   BP 119/77   Pulse (!) 107   Temp 36.7 C (Oral)   Resp 20   Ht 5\' 3"  (1.6 m)   Wt 266 lb 1.5 oz (120.7 kg)   SpO2 98%   BMI 47.14 kg/m   01/19/16 EKG: NSR, LAFB, minimal voltage criteria for LVH, may be normal variant. Prolonged QT (404 ms, QTc 480 ms).  03/29/16 Nuclear stress test:  Nuclear stress EF: 64%.  There was no ST segment deviation noted during stress.  Defect 1: There is a small defect of moderate severity present in the apex location.  The study is normal.  The left ventricular ejection fraction is normal (55-65%).  Normal stress nuclear study with mild apical thinning; no ischemia or infarction; EF 64 with normal wall motion.  06/20/11 Echo: Study Conclusions - Left ventricle: The cavity size was normal. Systolic function was normal. The estimated ejection fraction was in the range of 55% to 60%.  Wall motion was normal; there were no regional wall motion abnormalities. - Pulmonary arteries: PA peak pressure: 33mm Hg (S).  02/16/16 CXR: FINDINGS: No active infiltrate or effusion is seen. Mediastinal and hilar contours are unremarkable. Mild cardiomegaly is stable. There are degenerative changes throughout the mid to lower thoracic spine. IMPRESSION: Stable mild cardiomegaly.  No active lung disease.  03/13/16 CT Chest/abd/pelvis ([facility]; Care Everywhere): IMPRESSION: 1. The 16 mm right renal lesion is unchanged. Most likely a cyst, could be confirmed with ultrasound. 2. Interval postoperative change in the lumbar region with partial L4 laminectomy and developing heterotopic ossification in the deep subcutaneous tissues. 3. Small hiatal hernia. 4. Colonic diverticulosis without diverticulitis. 5. Cholecystectomy with nonspecific mild biliary dilatation unchanged. 6. 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   Preoperative labs noted. K 3.0. (She is on a KCl supplement.) Cr 0.64. Glucose 146. H/H 11.9/35.8. She is for PTT on the day of surgery.  Possible splenic vein thrombus is a chronic findings. Recent stress test was non-ischemic. If no acute changes then I anticipate that she can proceed as planned. Discussed with anesthesiologist Dr. Kalman Shan.  [facility] Short Stay Center/Anesthesiology Phone [phone] 05/10/2016 12:57 PM

## 2016-05-11 ENCOUNTER — Ambulatory Visit (HOSPITAL_COMMUNITY): Payer: Medicare Other | Admitting: Vascular Surgery

## 2016-05-11 ENCOUNTER — Ambulatory Visit (HOSPITAL_COMMUNITY): Payer: Medicare Other | Admitting: Anesthesiology

## 2016-05-11 ENCOUNTER — Encounter (HOSPITAL_COMMUNITY): Payer: Self-pay | Admitting: Anesthesiology

## 2016-05-11 ENCOUNTER — Encounter (HOSPITAL_COMMUNITY)
Admission: RE | Disposition: A | Discharge status: Home / Self Care | Payer: Self-pay | Source: Ambulatory Visit | Attending: Oral Surgery

## 2016-05-11 ENCOUNTER — Ambulatory Visit (HOSPITAL_COMMUNITY)
Admission: RE | Admit: 2016-05-11 | Discharge: 2016-05-11 | Disposition: A | Discharge status: Home / Self Care | Payer: Medicare Other | Source: Ambulatory Visit | Attending: Oral Surgery | Admitting: Oral Surgery

## 2016-05-11 DIAGNOSIS — Z886 Allergy status to analgesic agent status: Secondary | ICD-10-CM | POA: Insufficient documentation

## 2016-05-11 DIAGNOSIS — K029 Dental caries, unspecified: Secondary | ICD-10-CM | POA: Insufficient documentation

## 2016-05-11 DIAGNOSIS — R600 Localized edema: Secondary | ICD-10-CM | POA: Insufficient documentation

## 2016-05-11 DIAGNOSIS — D649 Anemia, unspecified: Secondary | ICD-10-CM | POA: Insufficient documentation

## 2016-05-11 DIAGNOSIS — Z880 Allergy status to penicillin: Secondary | ICD-10-CM | POA: Insufficient documentation

## 2016-05-11 DIAGNOSIS — Z885 Allergy status to narcotic agent status: Secondary | ICD-10-CM | POA: Insufficient documentation

## 2016-05-11 DIAGNOSIS — I739 Peripheral vascular disease, unspecified: Secondary | ICD-10-CM | POA: Insufficient documentation

## 2016-05-11 DIAGNOSIS — Z853 Personal history of malignant neoplasm of breast: Secondary | ICD-10-CM | POA: Insufficient documentation

## 2016-05-11 DIAGNOSIS — F329 Major depressive disorder, single episode, unspecified: Secondary | ICD-10-CM | POA: Insufficient documentation

## 2016-05-11 DIAGNOSIS — G629 Polyneuropathy, unspecified: Secondary | ICD-10-CM | POA: Insufficient documentation

## 2016-05-11 DIAGNOSIS — F419 Anxiety disorder, unspecified: Secondary | ICD-10-CM | POA: Insufficient documentation

## 2016-05-11 DIAGNOSIS — Z8719 Personal history of other diseases of the digestive system: Secondary | ICD-10-CM | POA: Insufficient documentation

## 2016-05-11 DIAGNOSIS — M278 Other specified diseases of jaws: Secondary | ICD-10-CM | POA: Diagnosis not present

## 2016-05-11 DIAGNOSIS — Z6841 Body Mass Index (BMI) 40.0 and over, adult: Secondary | ICD-10-CM | POA: Insufficient documentation

## 2016-05-11 DIAGNOSIS — G8929 Other chronic pain: Secondary | ICD-10-CM | POA: Insufficient documentation

## 2016-05-11 DIAGNOSIS — M1990 Unspecified osteoarthritis, unspecified site: Secondary | ICD-10-CM | POA: Diagnosis not present

## 2016-05-11 DIAGNOSIS — Z7901 Long term (current) use of anticoagulants: Secondary | ICD-10-CM | POA: Insufficient documentation

## 2016-05-11 DIAGNOSIS — Z86010 Personal history of colonic polyps: Secondary | ICD-10-CM | POA: Insufficient documentation

## 2016-05-11 DIAGNOSIS — K219 Gastro-esophageal reflux disease without esophagitis: Secondary | ICD-10-CM | POA: Insufficient documentation

## 2016-05-11 DIAGNOSIS — M797 Fibromyalgia: Secondary | ICD-10-CM | POA: Insufficient documentation

## 2016-05-11 DIAGNOSIS — Z888 Allergy status to other drugs, medicaments and biological substances status: Secondary | ICD-10-CM | POA: Insufficient documentation

## 2016-05-11 HISTORY — PX: MULTIPLE EXTRACTIONS WITH ALVEOLOPLASTY: SHX5342

## 2016-05-11 LAB — APTT: aPTT: 27 seconds (ref 24–36)

## 2016-05-11 SURGERY — MULTIPLE EXTRACTION WITH ALVEOLOPLASTY
Anesthesia: General | Site: Mouth

## 2016-05-11 MED ORDER — SUCCINYLCHOLINE CHLORIDE 20 MG/ML IJ SOLN
INTRAMUSCULAR | Status: DC | PRN
  Administered 2016-05-11: 120 mg via INTRAVENOUS

## 2016-05-11 MED ORDER — SODIUM CHLORIDE 0.9 % IR SOLN
Status: DC | PRN
  Administered 2016-05-11: 1000 mL

## 2016-05-11 MED ORDER — LIDOCAINE HCL (CARDIAC) 20 MG/ML IV SOLN
INTRAVENOUS | Status: DC | PRN
  Administered 2016-05-11: 100 mg via INTRAVENOUS

## 2016-05-11 MED ORDER — MIDAZOLAM HCL 5 MG/5ML IJ SOLN
INTRAMUSCULAR | Status: DC | PRN
  Administered 2016-05-11: 1 mg via INTRAVENOUS

## 2016-05-11 MED ORDER — CEFAZOLIN IN D5W 1 GM/50ML IV SOLN
INTRAVENOUS | Status: AC
  Filled 2016-05-11: qty 50

## 2016-05-11 MED ORDER — PROPOFOL 10 MG/ML IV BOLUS
INTRAVENOUS | Status: DC | PRN
  Administered 2016-05-11: 150 mg via INTRAVENOUS

## 2016-05-11 MED ORDER — 0.9 % SODIUM CHLORIDE (POUR BTL) OPTIME
TOPICAL | Status: DC | PRN
  Administered 2016-05-11: 1000 mL

## 2016-05-11 MED ORDER — FENTANYL CITRATE (PF) 100 MCG/2ML IJ SOLN: 25.0000 ug | INTRAMUSCULAR | Status: DC | PRN

## 2016-05-11 MED ORDER — LACTATED RINGERS IV SOLN
INTRAVENOUS | Status: DC
  Administered 2016-05-11: 09:00:00 via INTRAVENOUS

## 2016-05-11 MED ORDER — ONDANSETRON HCL 4 MG/2ML IJ SOLN
INTRAMUSCULAR | Status: AC
  Filled 2016-05-11: qty 2

## 2016-05-11 MED ORDER — PROPOFOL 10 MG/ML IV BOLUS
INTRAVENOUS | Status: AC
  Filled 2016-05-11: qty 20

## 2016-05-11 MED ORDER — FENTANYL CITRATE (PF) 100 MCG/2ML IJ SOLN
INTRAMUSCULAR | Status: AC
  Filled 2016-05-11: qty 2

## 2016-05-11 MED ORDER — SUCCINYLCHOLINE CHLORIDE 200 MG/10ML IV SOSY
PREFILLED_SYRINGE | INTRAVENOUS | Status: AC
  Filled 2016-05-11: qty 10

## 2016-05-11 MED ORDER — MIDAZOLAM HCL 2 MG/2ML IJ SOLN
INTRAMUSCULAR | Status: AC
Start: 2016-05-11 — End: 2016-05-11
  Filled 2016-05-11: qty 2

## 2016-05-11 MED ORDER — OXYMETAZOLINE HCL 0.05 % NA SOLN
NASAL | Status: AC
  Filled 2016-05-11: qty 15

## 2016-05-11 MED ORDER — FENTANYL CITRATE (PF) 100 MCG/2ML IJ SOLN
INTRAMUSCULAR | Status: DC | PRN
  Administered 2016-05-11: 50 ug via INTRAVENOUS

## 2016-05-11 MED ORDER — CEFAZOLIN IN D5W 1 GM/50ML IV SOLN
INTRAVENOUS | Status: DC | PRN
  Administered 2016-05-11: 1 g via INTRAVENOUS

## 2016-05-11 MED ORDER — ONDANSETRON HCL 4 MG/2ML IJ SOLN
INTRAMUSCULAR | Status: DC | PRN
Start: 2016-05-11 — End: 2016-05-11
  Administered 2016-05-11: 4 mg via INTRAVENOUS

## 2016-05-11 MED ORDER — HYDROCODONE-ACETAMINOPHEN 5-325 MG PO TABS: 1.0000 | ORAL_TABLET | Freq: Four times a day (QID) | ORAL | 0 refills | Status: DC | PRN

## 2016-05-11 MED ORDER — BUPIVACAINE-EPINEPHRINE 0.25% -1:200000 IJ SOLN
INTRAMUSCULAR | Status: DC | PRN
  Administered 2016-05-11: 30 mL

## 2016-05-11 MED ORDER — LIDOCAINE 2% (20 MG/ML) 5 ML SYRINGE
INTRAMUSCULAR | Status: AC
  Filled 2016-05-11: qty 5

## 2016-05-11 MED ORDER — OXYMETAZOLINE HCL 0.05 % NA SOLN
NASAL | Status: DC | PRN
  Administered 2016-05-11: 1 via TOPICAL

## 2016-05-11 MED ORDER — ONDANSETRON HCL 4 MG/2ML IJ SOLN: 4.0000 mg | Freq: Once | INTRAMUSCULAR | Status: DC | PRN

## 2016-05-11 SURGICAL SUPPLY — 32 items
BUR CROSS CUT FISSURE 1.6 (BURR) ×2 IMPLANT
BUR CROSS CUT FISSURE 1.6MM (BURR) ×1
BUR EGG ELITE 4.0 (BURR) ×2 IMPLANT
BUR EGG ELITE 4.0MM (BURR) ×1
CANISTER SUCTION 2500CC (MISCELLANEOUS) ×3 IMPLANT
COVER SURGICAL LIGHT HANDLE (MISCELLANEOUS) ×3 IMPLANT
CRADLE DONUT ADULT HEAD (MISCELLANEOUS) ×3 IMPLANT
DECANTER SPIKE VIAL GLASS SM (MISCELLANEOUS) ×3 IMPLANT
DRAPE U-SHAPE 76X120 STRL (DRAPES) IMPLANT
FLUID NSS /IRRIG 1000 ML XXX (MISCELLANEOUS) ×3 IMPLANT
GAUZE PACKING FOLDED 2  STR (GAUZE/BANDAGES/DRESSINGS) ×2
GAUZE PACKING FOLDED 2 STR (GAUZE/BANDAGES/DRESSINGS) ×1 IMPLANT
GLOVE BIO SURGEON STRL SZ 6.5 (GLOVE) IMPLANT
GLOVE BIO SURGEON STRL SZ7.5 (GLOVE) ×3 IMPLANT
GLOVE BIO SURGEONS STRL SZ 6.5 (GLOVE)
GLOVE BIOGEL PI IND STRL 7.0 (GLOVE) ×1 IMPLANT
GLOVE BIOGEL PI INDICATOR 7.0 (GLOVE) ×2
GOWN STRL REUS W/ TWL LRG LVL3 (GOWN DISPOSABLE) ×1 IMPLANT
GOWN STRL REUS W/ TWL XL LVL3 (GOWN DISPOSABLE) ×1 IMPLANT
GOWN STRL REUS W/TWL LRG LVL3 (GOWN DISPOSABLE) ×3
GOWN STRL REUS W/TWL XL LVL3 (GOWN DISPOSABLE) ×3
KIT BASIN OR (CUSTOM PROCEDURE TRAY) ×3 IMPLANT
KIT ROOM TURNOVER OR (KITS) ×3 IMPLANT
NEEDLE 22X1 1/2 (OR ONLY) (NEEDLE) ×6 IMPLANT
NS IRRIG 1000ML POUR BTL (IV SOLUTION) ×3 IMPLANT
PAD ARMBOARD 7.5X6 YLW CONV (MISCELLANEOUS) ×3 IMPLANT
SUT CHROMIC 3 0 PS 2 (SUTURE) ×3 IMPLANT
SYR CONTROL 10ML LL (SYRINGE) ×3 IMPLANT
TOWEL OR 17X26 10 PK STRL BLUE (TOWEL DISPOSABLE) ×3 IMPLANT
TRAY ENT MC OR (CUSTOM PROCEDURE TRAY) ×3 IMPLANT
TUBING IRRIGATION (MISCELLANEOUS) ×3 IMPLANT
YANKAUER SUCT BULB TIP NO VENT (SUCTIONS) ×3 IMPLANT

## 2016-05-11 NOTE — Progress Notes (Signed)
Thank you for sending this case to me. I have reviewed the entire note, and the outlined plan seems appropriate.  Agree that duration of pain warrants endoscopic workup.  Seems most likely to be functional and related to chronic pain syndrome.

## 2016-05-11 NOTE — Anesthesia Preprocedure Evaluation (Addendum)
Anesthesia Evaluation  Patient identified by MRN, date of birth, ID band Patient awake    Reviewed: Allergy & Precautions, NPO status , Patient's Chart, lab work & pertinent test results  Airway Mallampati: II  TM Distance: >3 FB Neck ROM: Full    Dental  (+) Dental Advisory Given, Poor Dentition   Pulmonary neg pulmonary ROS,    Pulmonary exam normal breath sounds clear to auscultation       Cardiovascular + Peripheral Vascular Disease  Normal cardiovascular exam Rhythm:Regular Rate:Normal  03/29/16 Stress Test: Nuclear stress EF: 64%. There was no ST segment deviation noted during stress. Defect 1: There is a small defect of moderate severity present in the apex location. The study is normal. The left ventricular ejection fraction is normal (55-65%).   Normal stress nuclear study with mild apical thinning; no ischemia or infarction; EF 64 with normal wall motion.   Neuro/Psych  Neuromuscular disease    GI/Hepatic Neg liver ROS, GERD  Medicated,  Endo/Other  Morbid obesity  Renal/GU negative Renal ROS     Musculoskeletal  (+) Arthritis , Fibromyalgia -  Abdominal   Peds  Hematology  (+) Blood dyscrasia, anemia ,   Anesthesia Other Findings Day of surgery medications reviewed with the patient.  Reproductive/Obstetrics                            Anesthesia Physical Anesthesia Plan  ASA: III  Anesthesia Plan: General   Post-op Pain Management:    Induction: Intravenous  Airway Management Planned: Nasal ETT and Video Laryngoscope Planned  Additional Equipment:   Intra-op Plan:   Post-operative Plan: Extubation in OR  Informed Consent: I have reviewed the patients History and Physical, chart, labs and discussed the procedure including the risks, benefits and alternatives for the proposed anesthesia with the patient or authorized representative who has indicated his/her  understanding and acceptance.   Dental advisory given  Plan Discussed with: CRNA  Anesthesia Plan Comments: (Risks/benefits of general anesthesia discussed with patient including risk of damage to teeth, lips, gum, and tongue, nausea/vomiting, allergic reactions to medications, and the possibility of heart attack, stroke and death.  All patient questions answered.  Patient wishes to proceed.)        Anesthesia Quick Evaluation

## 2016-05-11 NOTE — Op Note (Signed)
05/11/2016  10:25 AM  PATIENT:  Kelsey Wilson  55 y.o. female  PRE-OPERATIVE DIAGNOSIS:  non restorable teeth #18, 20, 29, MANDIBULAR TORUS AND EXOSTOSIS  POST-OPERATIVE DIAGNOSIS:  SAME  PROCEDURE:  Procedure(s): EXTRACTION OF TEETH EIGHTEEN, TWENTY AND TWENTY- NINE;  REMOVAL OF MANDIBULAR TORUS AND EXOSTOSIS  SURGEON:  Surgeon(s): Diona Browner, DDS  ANESTHESIA:   local and general  EBL:  minimal  DRAINS: none   SPECIMEN:  No Specimen  COUNTS:  YES  PLAN OF CARE: Discharge to home after PACU  PATIENT DISPOSITION:  PACU - hemodynamically stable.   PROCEDURE DETAILS: Dictation # [id number]  Kelsey Wilson, DMD 05/11/2016 10:25 AM

## 2016-05-11 NOTE — Transfer of Care (Signed)
Immediate Anesthesia Transfer of Care Note  Patient: Kelsey Wilson  Procedure(s) Performed: Procedure(s): EXTRACTION OF TEETH EIGHTEEN, TWENTY AND TWENTY- NINE;  REMOVAL OF MANDIBULAR TORUS AND EXOSTOSIS (N/A)  Patient Location: PACU  Anesthesia Type:General  Level of Consciousness: awake, alert , oriented and patient cooperative  Airway & Oxygen Therapy: Patient Spontanous Breathing and Patient connected to face mask oxygen  Post-op Assessment: Report given to RN, Post -op Vital signs reviewed and stable and Patient moving all extremities  Post vital signs: Reviewed and stable  Last Vitals:  Vitals:   05/11/16 0828  BP: (!) 143/92  Pulse: (!) 102  Resp: 18  Temp: 36.9 C    Last Pain:  Vitals:   05/11/16 0828  TempSrc: Oral         Complications: No apparent anesthesia complications

## 2016-05-11 NOTE — Anesthesia Procedure Notes (Signed)
Procedure Name: Intubation Date/Time: 05/11/2016 10:00 AM Performed by: Terrill Mohr Pre-anesthesia Checklist: Patient identified, Emergency Drugs available, Suction available and Patient being monitored Patient Re-evaluated:Patient Re-evaluated prior to inductionOxygen Delivery Method: Circle system utilized Preoxygenation: Pre-oxygenation with 100% oxygen Intubation Type: IV induction Ventilation: Mask ventilation without difficulty Laryngoscope Size: Mac, 4 and Glidescope Grade View: Grade II Nasal Tubes: Left Tube size: 7.0 mm Number of attempts: 1 Placement Confirmation: breath sounds checked- equal and bilateral,  positive ETCO2 and ETT inserted through vocal cords under direct vision (By Dr. Gifford Shave) Tube secured with: across bridge of nose. Dental Injury: Teeth and Oropharynx as per pre-operative assessment

## 2016-05-11 NOTE — H&P (Signed)
H&P documentation  -History and Physical Reviewed  -Patient has been re-examined  -No change in the plan of care  Kelsey Wilson M  

## 2016-05-11 NOTE — Anesthesia Postprocedure Evaluation (Signed)
Anesthesia Post Note  Patient: Kelsey Wilson  Procedure(s) Performed: Procedure(s) (LRB): EXTRACTION OF TEETH EIGHTEEN, TWENTY AND TWENTY- NINE;  REMOVAL OF MANDIBULAR TORUS AND EXOSTOSIS (N/A)  Patient location during evaluation: PACU Anesthesia Type: General Level of consciousness: awake and alert Pain management: pain level controlled Vital Signs Assessment: post-procedure vital signs reviewed and stable Respiratory status: spontaneous breathing, nonlabored ventilation, respiratory function stable and patient connected to nasal cannula oxygen Cardiovascular status: blood pressure returned to baseline and stable Postop Assessment: no signs of nausea or vomiting Anesthetic complications: no    Last Vitals:  Vitals:   05/11/16 1200 05/11/16 1212  BP: 115/82 118/82  Pulse: 96 94  Resp: 12   Temp:      Last Pain:  Vitals:   05/11/16 1145  TempSrc:   PainSc: Asleep                 Catalina Gravel

## 2016-05-12 ENCOUNTER — Encounter (HOSPITAL_COMMUNITY): Payer: Self-pay | Admitting: Oral Surgery

## 2016-05-14 NOTE — Op Note (Signed)
NAME:  Kelsey Wilson, Kelsey Wilson                   ACCOUNT NO.:  MEDICAL RECORD NO.:  [id number]  LOCATION:                                 FACILITY:  PHYSICIAN:  Gae Bon, M.D.  DATE OF BIRTH:  14-Aug-1960  DATE OF PROCEDURE:  05/11/2016 DATE OF DISCHARGE:                              OPERATIVE REPORT   PREOPERATIVE DIAGNOSIS:  Nonrestorable teeth numbers 18, 20, 29; left mandibular exostosis, and right mandibular lingual torus.  POSTOPERATIVE DIAGNOSIS:  Nonrestorable teeth numbers 18, 20, 29; left mandibular exostosis, and right mandibular lingual torus.  PROCEDURE:  Extractions of teeth numbers 18, 20, 29 and removal of mandibular torus and exostosis.  SURGEON:  Gae Bon, M.D.  ANESTHESIA:  General.  PROCEDURE DESCRIPTION:  The patient was taken to the operating room, placed on the table in supine position.  General anesthesia was administered intravenously and a nasal endotracheal tube was placed and secured.  The eyes were protected and the patient was draped for the procedure.  Time-out was performed.  The posterior pharynx was suctioned.  A throat pack was placed.  A 0.25% Marcaine 1:200,000 epinephrine was infiltrated in an inferior alveolar block on the right and left side and in buccal and lingual infiltration of the anterior mandible.  A total of 17 mL was utilized.  A bite block was placed in the right side of the mouth and a sweetheart was used to retract the tongue.  A 15 blade was used to make an incision circumferentially around tooth #18, carried forward on the crest of the ridge to tooth #20 and then continued lingually in the gingival sulcus until tooth #24 was reached.  The periosteum was reflected from around teeth numbers 18 and 20, and reflected from the lingual aspect of the mandible to expose the exostosis.  Then, the periosteum was reflected with a periosteal elevator.  The 301 elevator was used to luxate tooth #20.  The tooth was removed with  Asch forceps.  Attempt was made to remove tooth #18 with the forceps.  The tooth fractured requiring removal of interseptal bone and removal of bone circumferentially around the mesial and distal roots.  Then, the roots were removed using a 301 elevator.  The sockets were curetted.  Then, the Seldin retractor was used to retract the lingual tissue and the bony exostosis was removed using the egg-shaped bur and bone file.  Then, the area was irrigated and closed with 3-0 chromic.  The bite block and sweetheart retractor were used to reposition to the right side of the mouth.  A #15 blade was used to make an incision on the crest of the ridge approximately 2 cm distal to tooth #29 and then carried forward lingually and the gingival sulcus around tooth numbers 29, 28, 27, and 26.  Then, the periosteum was reflected lingually to expose the large lingual torus.  Then, the 301 elevator was used to luxate tooth #29 and the tooth was removed with the Asch forceps.  The socket was curetted.  Then, the Seldin retractor was placed lingually and the lingual torus was reduced using the egg-shaped bur and then further smoothed with a bone  file.  Then, the area was irrigated and closed with 3-0 chromic.  The oral cavity was inspected, found to have good contour, hemostasis, and closure.  The oral cavity was irrigated and suctioned. The throat pack was removed.  The patient was left in the care of the anesthesia service for transportation to the recovery room and discharged.     Gae Bon, M.D.     SMJ/MEDQ  D:  05/11/2016  T:  05/12/2016  Job:  [id number]

## 2016-05-17 ENCOUNTER — Encounter: Payer: Self-pay | Admitting: Vascular Surgery

## 2016-05-17 ENCOUNTER — Ambulatory Visit (INDEPENDENT_AMBULATORY_CARE_PROVIDER_SITE_OTHER): Payer: Medicare Other | Admitting: Vascular Surgery

## 2016-05-17 VITALS — BP 113/72 | HR 89 | Temp 97.8°F | Resp 24 | Ht 63.0 in | Wt 256.0 lb

## 2016-05-17 DIAGNOSIS — I82890 Acute embolism and thrombosis of other specified veins: Secondary | ICD-10-CM | POA: Diagnosis not present

## 2016-05-17 NOTE — Progress Notes (Signed)
Referred by:  Javier Docker, MD [address] 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 ([birth year]) female who presents with cc: abdominal pain.  This patient has chronic abdominal pain describe as nondescript sharp pain in LUQ with some radiation to epigastrium.  The pain is variable intensity sometimes 10/10 intermittently.  The patient notes severe pain with bowel movement.  She denies melena or hematochezia.  She has no post-prandial pain or food fear.  She denies constipation or diarrhea.  She has had some nausea with this pain.  She has known breast cancer and diverticulitis.  She denies any liver disease or cirrhoisis or excess alcohol intake.    Past Medical History:  Diagnosis Date  . Anemia   . Anxiety   . Arthritis   . Breast cancer ([facility]) 2010   T3N1 invasive ductal carcinoma left breast.Takes Arimidex daily  . Bursitis   . Carpal tunnel syndrome   . Chronic back pain    stenosis  . Constipation    takes Colace daily  . Depression    takes Benzotropine daily  . Diverticulitis of colon   . Fibromyalgia 08/2012  . GERD (gastroesophageal reflux disease)    takes Dexilant daily  . Hemorrhoid   . History of blood transfusion    no abnormal reaction noted  . History of colon polyps    benign  . History of shingles   . Joint pain   . Joint swelling   . Night muscle spasms    takes Flexeril nightly as needed  . Nocturia   . Peripheral edema    takes Furosemide.Just started 01/18/16  . Peripheral neuropathy ([facility])    takes Lyrica daily  . Pneumonia    hx of-2015  . Seasonal allergies    takes Singulair nightly  . SOB (shortness of breath) on exertion    rarely with exertion  . 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 Alphonzo Grieve)    Past  Surgical History:  Procedure Laterality Date  . ABDOMINAL HYSTERECTOMY     still has ovaries  . APPENDECTOMY    . AXILLARY LYMPH NODE DISSECTION  11/28/2011   Procedure: AXILLARY LYMPH NODE DISSECTION;  Surgeon: Edward Jolly, MD;  Location: [facility];  Service: General;  Laterality: Left;  . BREAST SURGERY Left 2013  . CARPAL TUNNEL RELEASE     Bilateral  . CHOLECYSTECTOMY    . COLONOSCOPY    . EYE SURGERY Left    cataract removal  . KNEE SURGERY     Left Knee  . LUMBAR LAMINECTOMY/DECOMPRESSION MICRODISCECTOMY Bilateral 01/27/2016   Procedure: Laminectomy and Foraminotomy - Lumbar four -Lumbar five - bilateral- on-lay noninstrumented fusion;  Surgeon: Eustace Moore, MD;  Location: [facility];  Service: Neurosurgery;  Laterality: Bilateral;  . MULTIPLE EXTRACTIONS WITH ALVEOLOPLASTY N/A 05/11/2016   Procedure: EXTRACTION OF TEETH EIGHTEEN, TWENTY AND TWENTY- NINE;  REMOVAL OF MANDIBULAR TORUS AND EXOSTOSIS;  Surgeon: Diona Browner, DDS;  Location: [facility];  Service: Oral Surgery;  Laterality: N/A;  . PORTACATH PLACEMENT  06/18/2011   Procedure: INSERTION PORT-A-CATH;  Surgeon: Edward Jolly, MD;  Location: [facility];  Service: General;  Laterality: Right;  right subclavian  . removal portacath  2014  . spur  Apex spur on both big toes  . TOE SURGERY Bilateral   . TONSILLECTOMY    . TOTAL KNEE ARTHROPLASTY Left 09/13/2014  . TOTAL KNEE ARTHROPLASTY Left 09/13/2014   Procedure: LEFT TOTAL KNEE ARTHROPLASTY;  Surgeon: Rod Can, MD;  Location: [facility];  Service: Orthopedics;  Laterality: Left;  . TOTAL KNEE ARTHROPLASTY Right 03/10/2015   Procedure: RIGHT TOTAL KNEE ARTHROPLASTY;  Surgeon: Rod Can, MD;  Location: [facility];  Service: Orthopedics;  Laterality: Right;    Social History   Social History  . Marital status: Single    Spouse name: N/A  . Number of children: 3  . Years of education: N/A   Occupational History  . Not on file.   Social  History Main Topics  . Smoking status: Never Smoker  . Smokeless tobacco: Never Used  . Alcohol use No  . Drug use: No  . Sexual activity: No   Other Topics Concern  . Not on file   Social History Narrative  . No narrative on file    Family History  Problem Relation Age of Onset  . Hypertension Mother   . Diabetes Mother   . Hypertension Father   . Diabetes Father   . Cancer Paternal Grandmother     unknown  . Colon cancer Neg Hx     Current Outpatient Prescriptions  Medication Sig Dispense Refill  . anastrozole (ARIMIDEX) 1 MG tablet Take 1 tablet (1 mg total) by mouth daily. 30 tablet 3  . benzonatate (TESSALON) 100 MG capsule take 1 capsule by mouth four times a day if needed for cough  0  . benztropine (COGENTIN) 0.5 MG tablet Take 0.5 mg by mouth 2 (two) times daily.    . cholecalciferol (VITAMIN D) 1000 units tablet Take 1,000 Units by mouth daily. Takes 4,000 units daily    . clindamycin (CLEOCIN) 300 MG capsule Take 600 mg by mouth once. Takes 2 hours before dental procedures  0  . cyclobenzaprine (FLEXERIL) 10 MG tablet Take 10 mg by mouth 2 (two) times daily.  0  . Dexlansoprazole (DEXILANT) 30 MG capsule Take 30 mg by mouth daily.    Marland Kitchen docusate sodium (COLACE) 100 MG capsule Take 1 capsule (100 mg total) by mouth every 12 (twelve) hours. (Patient taking differently: Take 100 mg by mouth daily after breakfast. ) 60 capsule 0  . DULoxetine (CYMBALTA) 30 MG capsule Take 30 mg by mouth daily.    Marland Kitchen HYDROcodone-acetaminophen (NORCO) 5-325 MG tablet Take 1 tablet by mouth every 6 (six) hours as needed for moderate pain. 30 tablet 0  . LYRICA 100 MG capsule TAKE 1 CAPSULE BY MOUTH 3 TIMES DAILY 90 capsule 5  . metolazone (ZAROXOLYN) 2.5 MG tablet Take 2.5 mg by mouth daily.  0  . montelukast (SINGULAIR) 10 MG tablet Take 10 mg by mouth at bedtime.    . potassium chloride SA (K-DUR,KLOR-CON) 20 MEQ tablet Take 20 mEq by mouth daily.    Marland Kitchen PRADAXA 150 MG CAPS capsule Take 150  mg by mouth daily. Will stop 3 days prior to procedure  0  . promethazine (PHENERGAN) 25 MG tablet Take 25 mg by mouth every 4 (four) hours as needed for nausea or vomiting.    . traMADol (ULTRAM) 50 MG tablet Take 100 mg by mouth 3 (three) times daily as needed for moderate pain.     No current facility-administered medications for this visit.     Allergies  Allergen Reactions  . Oxycodone Other (  See Comments)    hallucinations  . Aleve [Naproxen] Nausea Only  . Compazine [Prochlorperazine] Other (See Comments)    Numbness of face and  lips  . Penicillins Nausea Only    Has patient had a PCN reaction causing immediate rash, facial/tongue/throat swelling, SOB or lightheadedness with hypotension: No Has patient had a PCN reaction causing severe rash involving mucus membranes or skin necrosis: No Has patient had a PCN reaction that required hospitalization No Has patient had a PCN reaction occurring within the last 10 years: No If all of the above answers are "NO", then may proceed with Cephalosporin use.      REVIEW OF SYSTEMS:   Cardiac:  positive for: no symptoms, negative for: Chest pain or chest pressure, Shortness of breath upon exertion and Shortness of breath when lying flat,   Vascular:  positive for: pain leg with walking and rest, leg swelling,  negative for: Blood clot in your veins  Pulmonary:  positive for: no symptoms,  negative for: Oxygen at home, Productive cough and Wheezing  Neurologic:  positive for: No symptoms, negative for: Sudden weakness in arms or legs, Sudden numbness in arms or legs, Sudden onset of difficulty speaking or slurred speech, Temporary loss of vision in one eye and Problems with dizziness  Gastrointestinal:  positive for: no symptoms, negative for: Blood in stool and Vomited blood  Genitourinary:  positive for: no symptoms, negative for: Burning when urinating and Blood in urine  Psychiatric:  positive for: no symptoms,    negative for: Major depression  Hematologic:  positive for: no symptoms,  negative for: negative for: Bleeding problems and Problems with blood clotting too easily  Dermatologic:  positive for: no symptoms, negative for: Rashes or ulcers  Constitutional:  positive for: no symptoms, negative for: Fever or chills  Ear/Nose/Throat:  positive for: no symptoms, negative for: Change in hearing, Nose bleeds and Sore throat  Musculoskeletal:  positive for: no symptoms, negative for: Back pain, Joint pain and Muscle pain   Physical Examination  Vitals:   05/17/16 1046  BP: 113/72  Pulse: 89  Resp: (!) 24  Temp: 97.8 F (36.6 C)  TempSrc: Oral  SpO2: 97%  Weight: 256 lb (116.1 kg)  Height: 5\' 3"  (1.6 m)    Body mass index is 45.35 kg/m.  General: Alert, O x 3, WD,NAD  Head: [place]/AT,   Ear/Nose/Throat: Hearing grossly intact, nares without erythema or drainage, oropharynx without Erythema or Exudate , Mallampati score: 3, Dentition intact  Eyes: PERRLA, EOMI,   Neck: Supple, mid-line trachea,    Pulmonary: Sym exp, good B air movt,CTA B  Cardiac: RRR, Nl S1, S2, no Murmurs, No rubs, No S3,S4  Vascular: Vessel Right Left  Radial Palpable Palpable  Brachial Palpable Palpable  Carotid Palpable, No Bruit Palpable, No Bruit  Aorta Not palpable due to pannus N/A  Femoral Palpable Palpable  Popliteal Not palpable Not palpable  PT Palpable Palpable  DP Palpable Palpable   Gastrointestinal: soft, non-distended, non-tender to palpation, No guarding or rebound, no HSM, no masses, no CVAT B, No palpable prominent aortic pulse,    Musculoskeletal: M/S 5/5 throughout  , Extremities without ischemic changes  , Edema in B legs, no obvious varicosities, No LDS present, large pannus, healed incision over B 1st MT  Neurologic: CN 2-12 intact , Pain and light touch intact in extremities , Motor exam as listed above  Psychiatric: Judgement intact, Mood & affect appropriate for  pt's clinical situation  Dermatologic: See  M/S exam for extremity exam, No rashes otherwise noted  Lymph : Palpable lymph nodes: None   Outside Studies/Documentation 4 pages of outside documents were reviewed including: outside CT report.   Medical Decision Making  Kelsey Wilson is a 55 y.o. 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  Additionally, I doubt that the outside CT was timed to fully evaluate the splenic vein as most CT abd/pelvis studies are timed for arterial capture rather than venous capture.  Subsequently, I can't really comment on the presence or absence of a splenic vein thrombus.  The modern treatment for portomesenteric thrombosis is indeed anticoagulation as there was no advantage to surgical thrombectomy.  The CT also does NOT demonstrate splenic varices or splenomegaly, so I doubt the splenic vein is completely occluded.  I will arrange for a mesenteric duplex to try to specifically interrogate the splenic vein.  If the waveform appear to be normal, I suspect that anticoagulation can be discontinued.  Regardless, I doubt that this patient's possible partial SVT is responsible for her sx.  Usually an intra-abdominal process including intra-abdominal infections cause the portomesenteric thrombosis, so part of the therapy is determining the primary etiology for her sx.  She will come back in 4 weeks for the study.  Thank you for allowing [place] to participate in this patient's care.   Adele Barthel, MD, FACS [facility] Office: [phone] Pager: [phone]  05/17/2016, 11:13 AM

## 2016-05-22 NOTE — Addendum Note (Signed)
Addended by: Lianne Cure A on: 05/22/2016 11:28 AM   Modules accepted: Orders

## 2016-05-30 ENCOUNTER — Ambulatory Visit
Admission: RE | Admit: 2016-05-30 | Discharge: 2016-05-30 | Disposition: A | Discharge status: Home / Self Care | Payer: Medicare Other | Source: Ambulatory Visit | Attending: Oncology | Admitting: Oncology

## 2016-05-30 ENCOUNTER — Ambulatory Visit
Admission: RE | Admit: 2016-05-30 | Discharge: 2016-05-30 | Disposition: A | Discharge status: Home / Self Care | Payer: Medicare Other | Source: Ambulatory Visit | Attending: Nurse Practitioner | Admitting: Nurse Practitioner

## 2016-05-30 DIAGNOSIS — E2839 Other primary ovarian failure: Secondary | ICD-10-CM

## 2016-05-30 DIAGNOSIS — Z853 Personal history of malignant neoplasm of breast: Secondary | ICD-10-CM

## 2016-06-05 ENCOUNTER — Ambulatory Visit (HOSPITAL_COMMUNITY)
Admission: RE | Admit: 2016-06-05 | Discharge: 2016-06-05 | Disposition: A | Discharge status: Home / Self Care | Payer: Medicare Other | Source: Ambulatory Visit | Attending: Surgery | Admitting: Surgery

## 2016-06-05 DIAGNOSIS — I82890 Acute embolism and thrombosis of other specified veins: Secondary | ICD-10-CM | POA: Diagnosis present

## 2016-06-06 ENCOUNTER — Ambulatory Visit
Admission: RE | Admit: 2016-06-06 | Discharge: 2016-06-06 | Disposition: A | Discharge status: Home / Self Care | Payer: Medicare Other | Source: Ambulatory Visit | Attending: Oncology | Admitting: Oncology

## 2016-06-06 MED FILL — LYRICA 100 MG CAPSULE: 100 | 30 days supply | Qty: 90 | Fill #2

## 2016-06-06 MED FILL — ANASTROZOLE 1 MG TABLET: 1 | 30 days supply | Qty: 30 | Fill #3

## 2016-06-07 ENCOUNTER — Other Ambulatory Visit (HOSPITAL_BASED_OUTPATIENT_CLINIC_OR_DEPARTMENT_OTHER): Payer: Self-pay

## 2016-06-07 DIAGNOSIS — R5383 Other fatigue: Secondary | ICD-10-CM

## 2016-06-07 DIAGNOSIS — G4700 Insomnia, unspecified: Secondary | ICD-10-CM

## 2016-06-07 DIAGNOSIS — R0683 Snoring: Secondary | ICD-10-CM

## 2016-06-12 ENCOUNTER — Ambulatory Visit (AMBULATORY_SURGERY_CENTER): Payer: Medicare Other | Admitting: Gastroenterology

## 2016-06-12 ENCOUNTER — Encounter: Payer: Self-pay | Admitting: Gastroenterology

## 2016-06-12 VITALS — BP 151/78 | HR 112 | Temp 98.6°F | Resp 16 | Ht 63.0 in | Wt 260.0 lb

## 2016-06-12 DIAGNOSIS — R109 Unspecified abdominal pain: Secondary | ICD-10-CM | POA: Diagnosis not present

## 2016-06-12 DIAGNOSIS — R1010 Upper abdominal pain, unspecified: Secondary | ICD-10-CM

## 2016-06-12 MED ORDER — SODIUM CHLORIDE 0.9 % IV SOLN: 500.0000 mL | INTRAVENOUS | Status: DC

## 2016-06-12 NOTE — Op Note (Signed)
Kelsey Wilson Patient Name: Kelsey Wilson Procedure Date: 06/12/2016 2:37 PM MRN: [id number] Endoscopist: [place]. Kelsey Wilson , MD Age: 57 Referring MD:  Date of Birth: 12-11-1960 Gender: Female Account #: [id number] Procedure:                Upper GI endoscopy Indications:              Upper abdominal pain Medicines:                Monitored Anesthesia Care Procedure:                Pre-Anesthesia Assessment:                           - Prior to the procedure, a History and Physical                            was performed, and patient medications and                            allergies were reviewed. The patient's tolerance of                            previous anesthesia was also reviewed. The risks                            and benefits of the procedure and the sedation                            options and risks were discussed with the patient.                            All questions were answered, and informed consent                            was obtained. Prior Anticoagulants: The patient has                            taken Pradaxa (dabigatran), last dose was 2 days                            prior to procedure. ASA Grade Assessment: III - A                            patient with severe systemic disease. After                            reviewing the risks and benefits, the patient was                            deemed in satisfactory condition to undergo the                            procedure.  After obtaining informed consent, the endoscope was                            passed under direct vision. Throughout the                            procedure, the patient's blood pressure, pulse, and                            oxygen saturations were monitored continuously. The                            Model GIF-HQ190 [id number]) scope was introduced                            through the mouth, and advanced to the second part             of duodenum. The upper GI endoscopy was                            accomplished without difficulty. The patient                            tolerated the procedure well. Scope In: Scope Out: Findings:                 The esophagus was normal.                           The entire examined stomach was normal. Biopsies                            were taken from the antrum with a cold forceps for                            histology.                           The cardia and gastric fundus were normal on                            retroflexion.                           The examined duodenum was normal. Complications:            No immediate complications. Estimated Blood Loss:     Estimated blood loss: none. Impression:               - Normal esophagus.                           - Normal stomach. Biopsied.                           - Normal examined duodenum.  No visible cause for patient's pain was found.                            Recent abdominal/pelvic CT scan only notable for                            splenic vein thrombosis, which is not the cause of                            this pain.                           This appears to be due a more diffuse chronic pain                            syndrome. No further GI testing is planned. Recommendation:           - Patient has a contact number available for                            emergencies. The signs and symptoms of potential                            delayed complications were discussed with the                            patient. Return to normal activities tomorrow.                            Written discharge instructions were provided to the                            patient.                           - Resume previous diet.                           - Resume Pradaxa (dabigatran) at prior dose                            tomorrow.                           - Await pathology results.                            - Return to my office as needed. Henry L. Kelsey Carrow, MD 06/12/2016 3:13:18 PM This report has been signed electronically.

## 2016-06-12 NOTE — Progress Notes (Signed)
Called to room to assist during endoscopic procedure.  Patient ID and intended procedure confirmed with present staff. Received instructions for my participation in the procedure from the performing physician.  

## 2016-06-12 NOTE — Progress Notes (Signed)
Report given to PACU RN, vss 

## 2016-06-12 NOTE — Patient Instructions (Signed)
Resume your Pradaxa at your prior dosage tomorrow.   YOU HAD AN ENDOSCOPIC PROCEDURE TODAY AT [facility]:   Refer to the procedure report that was given to you for any specific questions about what was found during the examination.  If the procedure report does not answer your questions, please call your gastroenterologist to clarify.  If you requested that your care partner not be given the details of your procedure findings, then the procedure report has been included in a sealed envelope for you to review at your convenience later.  YOU SHOULD EXPECT: Some feelings of bloating in the abdomen. Passage of more gas than usual.  Walking can help get rid of the air that was put into your GI tract during the procedure and reduce the bloating. If you had a lower endoscopy (such as a colonoscopy or flexible sigmoidoscopy) you may notice spotting of blood in your stool or on the toilet paper. If you underwent a bowel prep for your procedure, you may not have a normal bowel movement for a few days.  Please Note:  You might notice some irritation and congestion in your nose or some drainage.  This is from the oxygen used during your procedure.  There is no need for concern and it should clear up in a day or so.  SYMPTOMS TO REPORT IMMEDIATELY:    Following upper endoscopy (EGD)  Vomiting of blood or coffee ground material  New chest pain or pain under the shoulder blades  Painful or persistently difficult swallowing  New shortness of breath  Fever of 100F or higher  Black, tarry-looking stools  For urgent or emergent issues, a gastroenterologist can be reached at any hour by calling [phone].   DIET:  We do recommend a small meal at first, but then you may proceed to your regular diet.  Drink plenty of fluids but you should avoid alcoholic beverages for 24 hours.  ACTIVITY:  You should plan to take it easy for the rest of today and you should NOT DRIVE or use heavy  machinery until tomorrow (because of the sedation medicines used during the test).    FOLLOW UP: Our staff will call the number listed on your records the next business day following your procedure to check on you and address any questions or concerns that you may have regarding the information given to you following your procedure. If we do not reach you, we will leave a message.  However, if you are feeling well and you are not experiencing any problems, there is no need to return our call.  We will assume that you have returned to your regular daily activities without incident.  If any biopsies were taken you will be contacted by phone or by letter within the next 1-3 weeks.  Please call us at [phone] if you have not heard about the biopsies in 3 weeks.    SIGNATURES/CONFIDENTIALITY: You and/or your care partner have signed paperwork which will be entered into your electronic medical record.  These signatures attest to the fact that that the information above on your After Visit Summary has been reviewed and is understood.  Full responsibility of the confidentiality of this discharge information lies with you and/or your care-partner.

## 2016-06-13 ENCOUNTER — Other Ambulatory Visit: Payer: Self-pay | Admitting: *Deleted

## 2016-06-13 ENCOUNTER — Telehealth: Payer: Self-pay

## 2016-06-13 DIAGNOSIS — C50412 Malignant neoplasm of upper-outer quadrant of left female breast: Secondary | ICD-10-CM

## 2016-06-13 NOTE — Telephone Encounter (Signed)
  Follow up Call-  Call back number 06/12/2016 08/08/2015  Post procedure Call Back phone  # [phone]  Permission to leave phone message Yes Yes  Some recent data might be hidden      Patient questions:  Do you have a fever, pain , or abdominal swelling? No. Pain Score  0 *  Have you tolerated food without any problems? Yes.    Have you been able to return to your normal activities? Yes.    Do you have any questions about your discharge instructions: Diet   No. Medications  No. Follow up visit  No.  Do you have questions or concerns about your Care? No.  Actions: * If pain score is 4 or above: No action needed, pain <4.

## 2016-06-14 ENCOUNTER — Other Ambulatory Visit (HOSPITAL_BASED_OUTPATIENT_CLINIC_OR_DEPARTMENT_OTHER): Payer: Medicare Other

## 2016-06-14 ENCOUNTER — Ambulatory Visit (HOSPITAL_BASED_OUTPATIENT_CLINIC_OR_DEPARTMENT_OTHER): Payer: Medicare Other | Admitting: Oncology

## 2016-06-14 VITALS — BP 129/79 | HR 98 | Temp 97.9°F | Resp 18 | Ht 63.0 in | Wt 268.2 lb

## 2016-06-14 DIAGNOSIS — M8580 Other specified disorders of bone density and structure, unspecified site: Secondary | ICD-10-CM

## 2016-06-14 DIAGNOSIS — C50412 Malignant neoplasm of upper-outer quadrant of left female breast: Secondary | ICD-10-CM | POA: Diagnosis present

## 2016-06-14 DIAGNOSIS — M159 Polyosteoarthritis, unspecified: Secondary | ICD-10-CM | POA: Diagnosis not present

## 2016-06-14 DIAGNOSIS — M545 Low back pain: Secondary | ICD-10-CM

## 2016-06-14 DIAGNOSIS — M797 Fibromyalgia: Secondary | ICD-10-CM | POA: Diagnosis not present

## 2016-06-14 DIAGNOSIS — I890 Lymphedema, not elsewhere classified: Secondary | ICD-10-CM | POA: Diagnosis not present

## 2016-06-14 DIAGNOSIS — Z170 Estrogen receptor positive status [ER+]: Secondary | ICD-10-CM

## 2016-06-14 LAB — COMPREHENSIVE METABOLIC PANEL
ALT: 14 U/L (ref 0–55)
AST: 19 U/L (ref 5–34)
Albumin: 3.5 g/dL (ref 3.5–5.0)
Alkaline Phosphatase: 92 U/L (ref 40–150)
Anion Gap: 7 mEq/L (ref 3–11)
BUN: 18.6 mg/dL (ref 7.0–26.0)
CO2: 30 mEq/L — ABNORMAL HIGH (ref 22–29)
Calcium: 9.5 mg/dL (ref 8.4–10.4)
Chloride: 103 mEq/L (ref 98–109)
Creatinine: 0.8 mg/dL (ref 0.6–1.1)
EGFR: >90 mL/min/{1.73_m2} (ref >90)
Glucose: 114 mg/dl (ref 70–140)
Potassium: 3.9 mEq/L (ref 3.5–5.1)
Sodium: 140 mEq/L (ref 136–145)
Total Bilirubin: 0.67 mg/dL (ref 0.20–1.20)
Total Protein: 6.8 g/dL (ref 6.4–8.3)

## 2016-06-14 LAB — CBC WITH DIFFERENTIAL/PLATELET
BASO%: 0.3 % (ref 0.0–2.0)
Basophils Absolute: 0 10*3/uL (ref 0.0–0.1)
EOS%: 4.6 % (ref 0.0–7.0)
Eosinophils Absolute: 0.2 10*3/uL (ref 0.0–0.5)
HCT: 34.6 % — ABNORMAL LOW (ref 34.8–46.6)
HGB: 11.4 g/dL — ABNORMAL LOW (ref 11.6–15.9)
LYMPH%: 41.3 % (ref 14.0–49.7)
MCH: 28.1 pg (ref 25.1–34.0)
MCHC: 32.9 g/dL (ref 31.5–36.0)
MCV: 85.4 fL (ref 79.5–101.0)
MONO#: 0.3 10*3/uL (ref 0.1–0.9)
MONO%: 9.1 % (ref 0.0–14.0)
NEUT#: 1.5 10*3/uL (ref 1.5–6.5)
NEUT%: 44.7 % (ref 38.4–76.8)
Platelets: 181 10*3/uL (ref 145–400)
RBC: 4.05 10*6/uL (ref 3.70–5.45)
RDW: 13.6 % (ref 11.2–14.5)
WBC: 3.3 10*3/uL — ABNORMAL LOW (ref 3.9–10.3)
lymph#: 1.4 10*3/uL (ref 0.9–3.3)

## 2016-06-14 NOTE — Progress Notes (Signed)
[facility]  [phone] [phone]  OFFICE PROGRESS NOTE   ID: KENDRAH LOVERN   DOB: 23-Aug-1960  MR#: [id number]  KZS#:[id number]  PCP: Javier Docker, MD GYN:  SU: Excell Seltzer, MD OTHER MD: Earnie Larsson,  Theodis Sato, MD, Alysia Penna mD  CHIEF COMPLAINT: left breast cancer  CURRENT THERAPY: anastrozole   BREAST CANCER HISTORY: From Dr. Julien Girt note 06/13/2011:   "The patient first noticed a lump in the upper outer left breast about mid November. She had been having regular mammograms. She consulted her physician and was referred to the breast center for further workup. Bilateral mammogram was performed which revealed an irregular mass in the upper outer quadrant of the left breast at the 2:00 position measuring approximately 3 cm. A clearly enlarged left axillary lymph node was also seen. 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 Ki-67 was 100%. Subsequent bilateral breast MRI was performed. This reveals abnormal enhancement in the area of the mass measuring 2.9 x 5.2 cm. Again noted was an approximately 3 cm axillary lymph node. No other areas were detected."   Her subsequent history is as detailed below.  INTERVAL HISTORY: Manon returns today for follow-up of her estrogen receptor positive breast cancer. As far as the cancer is concerned interval history is generally unremarkable. She tolerates the anastrozole well.   Hot flashes and vaginal dryness are not a major issue. She never developed the arthralgias or myalgias that many patients can experience on this medication. She obtains it at a good price.  She just had a repeat mammogram which shows low density which means these tests are sensitive and it is negative. She also had a repeat bone density which shows very minimal osteopenia  with a T score of -1.3  REVIEW OF SYSTEMS: Tamkia continues to have chronic pain in both shoulders and her back primarily. She tells me she is swelling and continuing to gain weight. She has night sweats and some hot flashes. She sleeps very poorly. She is tired all the time. She spends a little less than half the day in bed. She has mouth sores at times. She can be hoarse. Her ankles swell. She has difficulty walking. She sleeps on 4 pillows. She is having abdominal problems and she tells me her GI doctor is planning to do an upper endoscopy soon. She continues to have severe back pain and arthritis and is considering back surgery. She feels faint forgetful and anxious and depressed. A detailed review of systems today was otherwise stable.   PAST MEDICAL HISTORY: Past Medical History:  Diagnosis Date  . Anemia   . Anxiety   . Arthritis   . Breast cancer ([place]) 2010   T3N1 invasive ductal carcinoma left breast.Takes Arimidex daily  . Bursitis   . Carpal tunnel syndrome   . Chronic back pain    stenosis  . Constipation    takes Colace daily  . Depression    takes Benzotropine daily  . Diverticulitis of colon   . Fibromyalgia 08/2012  . GERD (gastroesophageal reflux disease)    takes Dexilant daily  . Hemorrhoid   . History of blood transfusion    no abnormal reaction noted  . History of colon polyps    benign  . History of shingles   . Joint pain   . Joint swelling   . Night  muscle spasms    takes Flexeril nightly as needed  . Nocturia   . Peripheral edema    takes Furosemide.Just started 01/18/16  . Peripheral neuropathy (HCC)    takes Lyrica daily  . Pneumonia    hx of-2015  . Seasonal allergies    takes Singulair nightly  . SOB (shortness of breath) on exertion    rarely with exertion  . 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 Alphonzo Grieve)    PAST  SURGICAL HISTORY: Past Surgical History:  Procedure Laterality Date  . ABDOMINAL HYSTERECTOMY     still has ovaries  . APPENDECTOMY    . AXILLARY LYMPH NODE DISSECTION  11/28/2011   Procedure: AXILLARY LYMPH NODE DISSECTION;  Surgeon: Edward Jolly, MD;  Location: [facility];  Service: General;  Laterality: Left;  . BREAST SURGERY Left 2013  . CARPAL TUNNEL RELEASE     Bilateral  . CHOLECYSTECTOMY    . COLONOSCOPY    . EYE SURGERY Left    cataract removal  . KNEE SURGERY     Left Knee  . LUMBAR LAMINECTOMY/DECOMPRESSION MICRODISCECTOMY Bilateral 01/27/2016   Procedure: Laminectomy and Foraminotomy - Lumbar four -Lumbar five - bilateral- on-lay noninstrumented fusion;  Surgeon: Eustace Moore, MD;  Location: [facility];  Service: Neurosurgery;  Laterality: Bilateral;  . MULTIPLE EXTRACTIONS WITH ALVEOLOPLASTY N/A 05/11/2016   Procedure: EXTRACTION OF TEETH EIGHTEEN, TWENTY AND TWENTY- NINE;  REMOVAL OF MANDIBULAR TORUS AND EXOSTOSIS;  Surgeon: Diona Browner, DDS;  Location: [facility];  Service: Oral Surgery;  Laterality: N/A;  . PORTACATH PLACEMENT  06/18/2011   Procedure: INSERTION PORT-A-CATH;  Surgeon: Edward Jolly, MD;  Location: [facility];  Service: General;  Laterality: Right;  right subclavian  . removal portacath  2014  . spur     Apex spur on both big toes  . TOE SURGERY Bilateral   . TONSILLECTOMY    . TOTAL KNEE ARTHROPLASTY Left 09/13/2014  . TOTAL KNEE ARTHROPLASTY Left 09/13/2014   Procedure: LEFT TOTAL KNEE ARTHROPLASTY;  Surgeon: Rod Can, MD;  Location: [facility];  Service: Orthopedics;  Laterality: Left;  . TOTAL KNEE ARTHROPLASTY Right 03/10/2015   Procedure: RIGHT TOTAL KNEE ARTHROPLASTY;  Surgeon: Rod Can, MD;  Location: [facility];  Service: Orthopedics;  Laterality: Right;    FAMILY HISTORY Family History  Problem Relation Age of Onset  . Hypertension Mother   . Diabetes Mother   . Hypertension Father   . Diabetes Father   . Cancer  Paternal Grandmother     unknown  . Colon cancer Neg Hx   Her father, 25 years old is the minister at [employer] she attends. Her mother is 57. The patient has 3 brothers and 3 sisters. There is no history of breast or ovarian cancer in the immediate family.  GYNECOLOGIC HISTORY: Menarche age 21, first live birth age 69, she is [place] P3. She underwent hysterectomy without salpingo-oophorectomy in 2000. She never took hormone replacement.  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 She is divorced. At home she lives with herr daughter Ree Edman and her 6 children. Her son Alaira Level works as a Administrator. The patient has 5 additional grandchildren. She attends a [employer].    ADVANCED DIRECTIVES: Not in place  HEALTH MAINTENANCE: Social History  Substance Use Topics  .  Smoking status: Never Smoker  . Smokeless tobacco: Never Used  . Alcohol use No     Colonoscopy: Not on file  PAP: Scheduled for January 2016  Bone density: Bone density December 2015 was normal.  Lipid panel: Not on file  Allergies  Allergen Reactions  . Oxycodone Other (See Comments)    hallucinations  . Aleve [Naproxen] Nausea Only  . Compazine [Prochlorperazine] Other (See Comments)    Numbness of face and  lips  . Penicillins Nausea Only    Has patient had a PCN reaction causing immediate rash, facial/tongue/throat swelling, SOB or lightheadedness with hypotension: No Has patient had a PCN reaction causing severe rash involving mucus membranes or skin necrosis: No Has patient had a PCN reaction that required hospitalization No Has patient had a PCN reaction occurring within the last 10 years: No If all of the above answers are "NO", then may proceed with Cephalosporin use.     Current Outpatient Prescriptions  Medication Sig Dispense Refill  . benzonatate (TESSALON) 100 MG capsule take 1 capsule by mouth four times a day if  needed for cough  0  . benztropine (COGENTIN) 0.5 MG tablet Take 0.5 mg by mouth 2 (two) times daily.    . cholecalciferol (VITAMIN D) 1000 units tablet Take 1,000 Units by mouth daily. Takes 4,000 units daily    . clindamycin (CLEOCIN) 300 MG capsule Take 600 mg by mouth once. Takes 2 hours before dental procedures  0  . cyclobenzaprine (FLEXERIL) 10 MG tablet Take 10 mg by mouth 2 (two) times daily.  0  . Dexlansoprazole (DEXILANT) 30 MG capsule Take 30 mg by mouth daily.    Marland Kitchen docusate sodium (COLACE) 100 MG capsule Take 1 capsule (100 mg total) by mouth every 12 (twelve) hours. (Patient not taking: Reported on 06/12/2016) 60 capsule 0  . DULoxetine (CYMBALTA) 30 MG capsule Take 30 mg by mouth daily.    Marland Kitchen HYDROcodone-acetaminophen (NORCO) 5-325 MG tablet Take 1 tablet by mouth every 6 (six) hours as needed for moderate pain. (Patient not taking: Reported on 06/12/2016) 30 tablet 0  . LYRICA 100 MG capsule TAKE 1 CAPSULE BY MOUTH 3 TIMES DAILY 90 capsule 5  . metolazone (ZAROXOLYN) 2.5 MG tablet Take 2.5 mg by mouth daily.  0  . montelukast (SINGULAIR) 10 MG tablet Take 10 mg by mouth at bedtime.    . potassium chloride SA (K-DUR,KLOR-CON) 20 MEQ tablet Take 20 mEq by mouth daily.    . promethazine (PHENERGAN) 25 MG tablet Take 25 mg by mouth every 4 (four) hours as needed for nausea or vomiting.    . traMADol (ULTRAM) 50 MG tablet Take 100 mg by mouth 3 (three) times daily as needed for moderate pain.     Current Facility-Administered Medications  Medication Dose Route Frequency Provider Last Rate Last Dose  . 0.9 %  sodium chloride infusion  500 mL Intravenous Continuous Nelida Meuse III, MD        OBJECTIVE: Morbidly obese African American woman who has difficulty climbing to the examining table Vitals:   06/14/16 0956  BP: 129/79  Pulse: 98  Resp: 18  Temp: 97.9 F (36.6 C)     Body mass index is 47.51 kg/m.    ECOG FS: 2  Sclerae unicteric, EOMs intact Oropharynx clear and  moist No cervical or supraclavicular adenopathy Lungs no rales or rhonchi Heart regular rate and rhythm Abd soft, obese, nontender, positive bowel sounds MSK no focal spinal tenderness to  mild palpation Neuro: nonfocal, well oriented, anxious affect Breasts: The right breast is unremarkable. The left breast is status post lumpectomy and radiation. There is no evidence of local recurrence. Both axillae are benign.    LAB RESULTS: Lab Results  Component Value Date   WBC 3.3 (L) 06/14/2016   NEUTROABS 1.5 06/14/2016   HGB 11.4 (L) 06/14/2016   HCT 34.6 (L) 06/14/2016   MCV 85.4 06/14/2016   PLT 181 06/14/2016      Chemistry      Component Value Date/Time   NA 139 05/09/2016 1507   NA 141 06/15/2015 1250   K 3.0 (L) 05/09/2016 1507   K 3.3 (L) 06/15/2015 1250   CL 99 (L) 05/09/2016 1507   CL 101 09/18/2012 0853   CO2 29 05/09/2016 1507   CO2 28 06/15/2015 1250   BUN 13 05/09/2016 1507   BUN 20.9 06/15/2015 1250   CREATININE 0.64 05/09/2016 1507   CREATININE 0.8 06/15/2015 1250      Component Value Date/Time   CALCIUM 9.3 05/09/2016 1507   CALCIUM 9.8 06/15/2015 1250   ALKPHOS 65 10/16/2015 1217   ALKPHOS 70 06/15/2015 1250   AST 19 10/16/2015 1217   AST 19 06/15/2015 1250   ALT 14 10/16/2015 1217   ALT 11 06/15/2015 1250   BILITOT 0.8 10/16/2015 1217   BILITOT 1.03 06/15/2015 1250      Lab Results  Component Value Date   LABCA2 41 (H) 06/13/2011    Urinalysis    Component Value Date/Time   COLORURINE YELLOW 09/02/2014 1044   APPEARANCEUR CLEAR 09/02/2014 1044   LABSPEC 1.012 09/02/2014 1044   PHURINE 6.0 09/02/2014 1044   GLUCOSEU NEGATIVE 09/02/2014 1044   HGBUR NEGATIVE 09/02/2014 1044   BILIRUBINUR NEGATIVE 09/02/2014 1044   KETONESUR NEGATIVE 09/02/2014 1044   PROTEINUR NEGATIVE 09/02/2014 1044   UROBILINOGEN 0.2 09/02/2014 1044   NITRITE NEGATIVE 09/02/2014 1044   LEUKOCYTESUR NEGATIVE 09/02/2014 1044    STUDIES: Dg Bone Density  Result  Date: 05/30/2016 EXAM: DUAL X-RAY ABSORPTIOMETRY (DXA) FOR BONE MINERAL DENSITY IMPRESSION: Referring Physician:  Laurie Panda PATIENT: Name: ALDINE, CHAKRABORTY Patient ID: [id number] Birth Date: September 03, 1960 Height: 62.2 in. Sex: Female Measured: 05/30/2016 Weight: 270.0 lbs. Indications: Anastrazole, Breast Cancer History, cymbalta, Height Loss (781.91), Hysterectomy, [place], Secondary Osteoporosis Fractures: Toe Treatments: Vitamin D (E933.5) ASSESSMENT: The BMD measured at Femur Neck Right is 0.855 g/cm2 with a T-score of -1.3. This patient is considered to osteopenic according to [facility]) criteria. L-3, L-4 were excluded due to degenerative changes. There has been a statistically significant decrease in BMD of left hip since prior exam dated 05/26/2014. Spine was not compared to prior study due to exclusion of vertebral bodies on current exam. Site Region Measured Date Measured Age YA BMD Significant CHANGE T-score DualFemur Neck Right 05/30/2016    55.3         -1.3    0.855 g/cm2 AP Spine  L1-L2      3/30/2016    [age]         -1.2    1.025 g/cm2 World Health Organization [facility]) criteria for post-menopausal, Caucasian Women: Normal       T-score at or above -1 SD Osteopenia   T-score between -1 and -2.5 SD Osteoporosis T-score at or below -2.5 SD RECOMMENDATION: [place] recommends that FDA-approved medical therapies be considered in postmenopausal women and men age 76 or older with a: 1. Hip or vertebral (clinical or morphometric)  fracture. 2. T-score of <-2.5 at the spine or hip. 3. Ten-year fracture probability by FRAX of 3% or greater for hip fracture or 20% or greater for major osteoporotic fracture. All treatment decisions require clinical judgment and consideration of individual patient factors, including patient preferences, co-morbidities, previous drug use, risk factors not captured in the FRAX model (e.g. falls, vitamin D deficiency, increased bone  turnover, interval significant decline in bone density) and possible under - or over-estimation of fracture risk by FRAX. All patients should ensure an adequate intake of dietary calcium (1200 mg/d) and vitamin D (800 IU daily) unless contraindicated. FOLLOW-UP: People with diagnosed cases of osteoporosis or at high risk for fracture should have regular bone mineral density tests. For patients eligible for Medicare, routine testing is allowed once every 2 years. The testing frequency can be increased to one year for patients who have rapidly progressing disease, those who are receiving or discontinuing medical therapy to restore bone mass, or have additional risk factors. I have reviewed this report, and agree with the above findings. [facility] FRAX* 10-year Probability of Fracture Based on femoral neck BMD: DualFemur (Right) Major Osteoporotic Fracture: 2.5% Hip Fracture:                0.2% Population:                  [place] (Black) Risk Factors:                Secondary Osteoporosis *FRAX is a [place] of [employer] for Metabolic Bone Disease, a [place] (WHO) [facility]. ASSESSMENT: The probability of a major osteoporotic fracture is 2.5 % within the next ten years. The probability of a hip fracture is 0.2 % within the next ten years. Electronically Signed   By: Marijo Conception, M.D.   On: 05/30/2016 14:34   Mm Diag Breast Tomo Bilateral  Result Date: 06/06/2016 CLINICAL DATA:  History of left breast cancer in 2013 status post breast conservation surgery. EXAM: 2D DIGITAL DIAGNOSTIC BILATERAL MAMMOGRAM WITH CAD AND ADJUNCT TOMO COMPARISON:  Previous exam(s). ACR Breast Density Category b: There are scattered areas of fibroglandular density. FINDINGS: There are stable postsurgical changes within the upper left breast. There are no new dominant masses, suspicious calcifications or secondary signs of malignancy within either breast.  Mammographic images were processed with CAD. IMPRESSION: No evidence of malignancy within either breast. Stable postsurgical changes within the upper left breast. RECOMMENDATION: Bilateral diagnostic mammogram in 1 year. I have discussed the findings and recommendations with the patient. Results were also provided in writing at the conclusion of the visit. If applicable, a reminder letter will be sent to the patient regarding the next appointment. BI-RADS CATEGORY  2: Benign. Electronically Signed   By: Franki Cabot M.D.   On: 06/06/2016 11:58      ASSESSMENT: 56 y.o. [place], [place] woman:  (1) Status post left breast biopsy 06/01/2011 for a cT2 pN1, stage IIB invasive ductal carcinoma, grade 3, estrogen receptor 80% and progesterone receptor 9% positive, with no HER-2 amplification and an MIB-1 of 100%  (2) Treated neoadjuvantly with 4 cycles of cyclophosphamide, epirubicin and fluorouracil, followed by 4 cycles of docetaxel, completed 11/01/2011  (3) Status post left lumpectomy and axillary lymph node resection 11/28/2011, showing a complete pathologic response (no residual invasive or in situ cancer in the breast and 0 of 17 lymph nodes involved)  (4) Adjuvant radiation therapy completed 03/31/2012  (  5) Started anastrozole November 2013; normal bone density scan 05/26/2014  (6) chronic Left upper extremity lymphedema  (7) fibromyalgia and osteoarthritis with polyarthralgia  PLAN:  Malkie has multiple comorbidities which are disabling to her and significantly compromising her quality of life. None of them however are related to her breast cancer as far as I can tell and she is now 4-1/2 years out from definitive surgery for her breast cancer with no evidence of disease recurrence.  She has tolerating the anastrozole well. The plan will be to continue that for a total of 7 years.  Today we reviewed her mammogram which is favorable and shows low density, and her bone density  which shows very minimal osteopenia with a T score of -1.3.  She is going to see me again in November of this year. She knows to call for any problems that may develop before that visit.  Chauncey Cruel, MD   06/14/2016   10:14 AM

## 2016-06-15 ENCOUNTER — Other Ambulatory Visit: Payer: Self-pay | Admitting: Nurse Practitioner

## 2016-06-19 ENCOUNTER — Encounter: Payer: Self-pay | Admitting: Gastroenterology

## 2016-06-19 ENCOUNTER — Encounter: Payer: Self-pay | Admitting: Vascular Surgery

## 2016-06-21 NOTE — Progress Notes (Signed)
Established Portomesenteric Thrombosis   History of Present Illness  Kelsey Wilson is a 56 y.o. ([birth year]) female who presents with chief complaint: LUQ pain.  The patient's symptoms have not progressed.  The patient's symptoms are: unchanged.  The patient returns today for mesenteric duplex to evaluate the splenic vein.  The patient's PMH, PSH, SH, and FamHx are unchanged from 05/17/16.  Current Outpatient Prescriptions  Medication Sig Dispense Refill  . benzonatate (TESSALON) 100 MG capsule take 1 capsule by mouth four times a day if needed for cough  0  . benztropine (COGENTIN) 0.5 MG tablet Take 0.5 mg by mouth 2 (two) times daily.    . cholecalciferol (VITAMIN D) 1000 units tablet Take 1,000 Units by mouth daily. Takes 4,000 units daily    . clindamycin (CLEOCIN) 300 MG capsule Take 600 mg by mouth once. Takes 2 hours before dental procedures  0  . cyclobenzaprine (FLEXERIL) 10 MG tablet Take 10 mg by mouth 2 (two) times daily.  0  . Dexlansoprazole (DEXILANT) 30 MG capsule Take 30 mg by mouth daily.    Marland Kitchen docusate sodium (COLACE) 100 MG capsule Take 1 capsule (100 mg total) by mouth every 12 (twelve) hours. (Patient not taking: Reported on 06/12/2016) 60 capsule 0  . DULoxetine (CYMBALTA) 30 MG capsule Take 30 mg by mouth daily.    Marland Kitchen HYDROcodone-acetaminophen (NORCO) 5-325 MG tablet Take 1 tablet by mouth every 6 (six) hours as needed for moderate pain. (Patient not taking: Reported on 06/12/2016) 30 tablet 0  . LYRICA 100 MG capsule TAKE 1 CAPSULE BY MOUTH 3 TIMES DAILY 90 capsule 5  . metolazone (ZAROXOLYN) 2.5 MG tablet Take 2.5 mg by mouth daily.  0  . montelukast (SINGULAIR) 10 MG tablet Take 10 mg by mouth at bedtime.    . potassium chloride SA (K-DUR,KLOR-CON) 20 MEQ tablet Take 20 mEq by mouth daily.    . promethazine (PHENERGAN) 25 MG tablet Take 25 mg by mouth every 4 (four) hours as needed for nausea or vomiting.    . traMADol (ULTRAM) 50 MG tablet Take 100 mg by mouth  3 (three) times daily as needed for moderate pain.     Current Facility-Administered Medications  Medication Dose Route Frequency Provider Last Rate Last Dose  . 0.9 %  sodium chloride infusion  500 mL Intravenous Continuous Nelida Meuse III, MD        Allergies  Allergen Reactions  . Oxycodone Other (See Comments)    hallucinations  . Aleve [Naproxen] Nausea Only  . Compazine [Prochlorperazine] Other (See Comments)    Numbness of face and  lips  . Penicillins Nausea Only    Has patient had a PCN reaction causing immediate rash, facial/tongue/throat swelling, SOB or lightheadedness with hypotension: No Has patient had a PCN reaction causing severe rash involving mucus membranes or skin necrosis: No Has patient had a PCN reaction that required hospitalization No Has patient had a PCN reaction occurring within the last 10 years: No If all of the above answers are "NO", then may proceed with Cephalosporin use.     On ROS today: LUQ pain, no bleeding complications   Physical Examination  Vitals:   06/22/16 1045  BP: 134/89  Pulse: 95  Resp: 18  Temp: 97.7 F (36.5 C)  TempSrc: Oral  SpO2: 100%  Weight: 268 lb (121.6 kg)  Height: 5\' 3"  (1.6 m)    Body mass index is 47.47 kg/m.  General: Alert, O x 3,  obese,NAD  Pulmonary: Sym exp, no upper airway sounds  Cardiac: RRR, Nl S1, S2,   Gastrointestinal: soft, large pannus, no HSM, no TTP  Musculoskeletal: normal gait, spont extremity movement without any weakness evident   Non-Invasive Vascular Imaging  Mesenteric duplex (06/05/16)  Patent aorta, celiac and SMA  Unable to visualize splenic vein   Medical Decision Making  Kelsey Wilson is a 56 y.o. 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  Unfortunately, mesenteric duplex was non-diagnostic.  Standard therapy for portomesenteric thrombus is anticoagulation or at least 3 months and  then consider reimaging.  Will defer to the PCP their choice in regards to anticoagulation.  As noted previously, PMT usually is the result of another intra-abdominal process, so it is always difficult to determine the etiology of a pt's abd pain: the underlying etiology or the PMT.  On exam, her spleen in not impressive, suggesting again her splenic vein thrombosis if present is not occlusive as usually there is significant splenic enlargement and infarction with total venous outflow obstruction.  Thank you for allowing [place] to participate in this patient's care.   Adele Barthel, MD, FACS [facility] Office: [phone] Pager: [phone]

## 2016-06-22 ENCOUNTER — Encounter: Payer: Self-pay | Admitting: Vascular Surgery

## 2016-06-22 ENCOUNTER — Ambulatory Visit (INDEPENDENT_AMBULATORY_CARE_PROVIDER_SITE_OTHER): Payer: Medicare Other | Admitting: Vascular Surgery

## 2016-06-22 VITALS — BP 134/89 | HR 95 | Temp 97.7°F | Resp 18 | Ht 63.0 in | Wt 268.0 lb

## 2016-06-22 DIAGNOSIS — I82890 Acute embolism and thrombosis of other specified veins: Secondary | ICD-10-CM

## 2016-06-25 ENCOUNTER — Encounter: Payer: Medicare Other | Attending: Physical Medicine & Rehabilitation

## 2016-06-25 ENCOUNTER — Ambulatory Visit (HOSPITAL_BASED_OUTPATIENT_CLINIC_OR_DEPARTMENT_OTHER): Payer: Medicare Other | Admitting: Physical Medicine & Rehabilitation

## 2016-06-25 ENCOUNTER — Encounter: Payer: Self-pay | Admitting: Physical Medicine & Rehabilitation

## 2016-06-25 VITALS — BP 125/79 | HR 90

## 2016-06-25 DIAGNOSIS — M797 Fibromyalgia: Secondary | ICD-10-CM

## 2016-06-25 DIAGNOSIS — M792 Neuralgia and neuritis, unspecified: Secondary | ICD-10-CM | POA: Diagnosis not present

## 2016-06-25 DIAGNOSIS — G8929 Other chronic pain: Secondary | ICD-10-CM

## 2016-06-25 DIAGNOSIS — M25519 Pain in unspecified shoulder: Secondary | ICD-10-CM | POA: Insufficient documentation

## 2016-06-25 DIAGNOSIS — M25511 Pain in right shoulder: Secondary | ICD-10-CM

## 2016-06-25 DIAGNOSIS — M25569 Pain in unspecified knee: Secondary | ICD-10-CM | POA: Diagnosis present

## 2016-06-25 DIAGNOSIS — M25512 Pain in left shoulder: Secondary | ICD-10-CM

## 2016-06-25 DIAGNOSIS — Z90710 Acquired absence of both cervix and uterus: Secondary | ICD-10-CM | POA: Diagnosis not present

## 2016-06-25 DIAGNOSIS — M961 Postlaminectomy syndrome, not elsewhere classified: Secondary | ICD-10-CM | POA: Insufficient documentation

## 2016-06-25 DIAGNOSIS — M549 Dorsalgia, unspecified: Secondary | ICD-10-CM | POA: Diagnosis present

## 2016-06-25 DIAGNOSIS — Z9889 Other specified postprocedural states: Secondary | ICD-10-CM | POA: Insufficient documentation

## 2016-06-25 DIAGNOSIS — M47817 Spondylosis without myelopathy or radiculopathy, lumbosacral region: Secondary | ICD-10-CM

## 2016-06-25 MED ORDER — TRAMADOL HCL 50 MG PO TABS: 100.0000 mg | ORAL_TABLET | Freq: Three times a day (TID) | ORAL | 5 refills | Status: DC | PRN

## 2016-06-25 NOTE — Patient Instructions (Signed)
Would not recommend taking the hydrocodone on a ongoing basis. This should be reserved for postoperative pain.

## 2016-06-25 NOTE — Progress Notes (Signed)
Subjective:    Patient ID: Kelsey Wilson, female    DOB: June 05, 1960, 56 y.o.   MRN: [id number]  HPI   Chief complaint, widespread body pain, shoulders, back and knees L4-5 fusion Dr Ronnald Ramp 01/27/16 no improvement in back pain since that time  Seen by Ortho Dr Lyla Glassing, did not rec knee brace Upper abd pain, hx of splenorenal shunt re evaled by [facility] PCP had nerve test in office, "nerve damage" Upper endoscopy 06/12/16 without abnormailty Oncology 06/14/16 no Breast Ca recurrence plan to cont anastrazole for ~57more yrs Pain Inventory Average Pain 10 Pain Right Now 10 My pain is .  In the last 24 hours, has pain interfered with the following? General activity 7 Relation with others 7 Enjoyment of life 7 What TIME of day is your pain at its worst? all Sleep (in general) Poor  Pain is worse with: walking, bending, sitting, inactivity and standing Pain improves with: . Relief from Meds: 0  Mobility use a cane ability to climb steps?  no do you drive?  yes  Function disabled: date disabled . I need assistance with the following:  meal prep and household duties  Neuro/Psych weakness numbness tingling depression anxiety  Prior Studies Any changes since last visit?  no  Physicians involved in your care Any changes since last visit?  no   Family History  Problem Relation Age of Onset  . Hypertension Mother   . Diabetes Mother   . Hypertension Father   . Diabetes Father   . Cancer Paternal Grandmother     unknown  . Colon cancer Neg Hx    Social History   Social History  . Marital status: Single    Spouse name: N/A  . Number of children: 3  . Years of education: N/A   Social History Main Topics  . Smoking status: Never Smoker  . Smokeless tobacco: Never Used  . Alcohol use No  . Drug use: No  . Sexual activity: No   Other Topics Concern  . Not on file   Social History Narrative  . No narrative on file   Past Surgical History:  Procedure  Laterality Date  . ABDOMINAL HYSTERECTOMY     still has ovaries  . APPENDECTOMY    . AXILLARY LYMPH NODE DISSECTION  11/28/2011   Procedure: AXILLARY LYMPH NODE DISSECTION;  Surgeon: Edward Jolly, MD;  Location: [facility];  Service: General;  Laterality: Left;  . BREAST SURGERY Left 2013  . CARPAL TUNNEL RELEASE     Bilateral  . CHOLECYSTECTOMY    . COLONOSCOPY    . EYE SURGERY Left    cataract removal  . KNEE SURGERY     Left Knee  . LUMBAR LAMINECTOMY/DECOMPRESSION MICRODISCECTOMY Bilateral 01/27/2016   Procedure: Laminectomy and Foraminotomy - Lumbar four -Lumbar five - bilateral- on-lay noninstrumented fusion;  Surgeon: Eustace Moore, MD;  Location: [facility];  Service: Neurosurgery;  Laterality: Bilateral;  . MULTIPLE EXTRACTIONS WITH ALVEOLOPLASTY N/A 05/11/2016   Procedure: EXTRACTION OF TEETH EIGHTEEN, TWENTY AND TWENTY- NINE;  REMOVAL OF MANDIBULAR TORUS AND EXOSTOSIS;  Surgeon: Diona Browner, DDS;  Location: [facility];  Service: Oral Surgery;  Laterality: N/A;  . PORTACATH PLACEMENT  06/18/2011   Procedure: INSERTION PORT-A-CATH;  Surgeon: Edward Jolly, MD;  Location: [facility];  Service: General;  Laterality: Right;  right subclavian  . removal portacath  2014  . spur     Apex spur on both big toes  .  TOE SURGERY Bilateral   . TONSILLECTOMY    . TOTAL KNEE ARTHROPLASTY Left 09/13/2014  . TOTAL KNEE ARTHROPLASTY Left 09/13/2014   Procedure: LEFT TOTAL KNEE ARTHROPLASTY;  Surgeon: Rod Can, MD;  Location: [facility];  Service: Orthopedics;  Laterality: Left;  . TOTAL KNEE ARTHROPLASTY Right 03/10/2015   Procedure: RIGHT TOTAL KNEE ARTHROPLASTY;  Surgeon: Rod Can, MD;  Location[facility];  Service: Orthopedics;  Laterality: Right;   Past Medical History:  Diagnosis Date  . Anemia   . Anxiety   . Arthritis   . Breast cancer ([place]) 2010   T3N1 invasive ductal carcinoma left breast.Takes Arimidex daily  . Bursitis   . Carpal tunnel syndrome   .  Chronic back pain    stenosis  . Constipation    takes Colace daily  . Depression    takes Benzotropine daily  . Diverticulitis of colon   . Fibromyalgia 08/2012  . GERD (gastroesophageal reflux disease)    takes Dexilant daily  . Hemorrhoid   . History of blood transfusion    no abnormal reaction noted  . History of colon polyps    benign  . History of shingles   . Joint pain   . Joint swelling   . Night muscle spasms    takes Flexeril nightly as needed  . Nocturia   . Peripheral edema    takes Furosemide.Just started 01/18/16  . Peripheral neuropathy (HCC)    takes Lyrica daily  . Pneumonia    hx of-2015  . Seasonal allergies    takes Singulair nightly  . SOB (shortness of breath) on exertion    rarely with exertion  . 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 Alphonzo Grieve)   There were no vitals taken for this visit.  Opioid Risk Score:   Fall Risk Score:  `1  Depression screen PHQ 2/9  Depression screen [facility] 2/9 08/23/2015 04/26/2015 02/04/2015  Decreased Interest 2 0 0  Down, Depressed, Hopeless 0 0 0  PHQ - 2 Score 2 0 0  Altered sleeping 1 - -  Tired, decreased energy 3 - -  Change in appetite 3 - -  Feeling bad or failure about yourself  0 - -  Trouble concentrating 3 - -  Moving slowly or fidgety/restless 2 - -  Suicidal thoughts 0 - -  PHQ-9 Score 14 - -  Difficult doing work/chores Very difficult - -  Some recent data might be hidden   Review of Systems  HENT: Negative.   Eyes: Negative.   Respiratory: Negative.   Cardiovascular: Negative.   Gastrointestinal: Negative.   Endocrine: Negative.   Genitourinary: Negative.   Musculoskeletal: Positive for back pain and gait problem.  Skin: Negative.   Allergic/Immunologic: Negative.   Neurological: Positive for weakness and numbness.       Tingling  Hematological: Negative.     Psychiatric/Behavioral: Positive for dysphoric mood. The patient is nervous/anxious.   All other systems reviewed and are negative.      Objective:   Physical Exam  Constitutional: She is oriented to person, place, and time. She appears well-developed and well-nourished.  HENT:  Head: Normocephalic and atraumatic.  Eyes: Conjunctivae and EOM are normal. Pupils are equal, round, and reactive to light.  Neck: Normal range of motion. Neck supple.  Musculoskeletal:       Lumbar back: She exhibits decreased range of motion and tenderness.  She exhibits no deformity.  Neurological: She is alert and oriented to person, place, and time.  Skin: Skin is warm and dry.  Nursing note and vitals reviewed.  Tenderness in the thoracic, lumbar spine, gluteus area, greater trochanters, 4/5 strength in bilateral deltoid, biceps, present grip, hip flexor, knee extensor, ankle dorsal flexor  Pinprick able to identify on the toes, on the left side. It feels sharper in the toe compared to the ankle. On the right side. It feels sharper at the ankle than at the toe.  Knee flexion 90 on right, 100 on the left       Assessment & Plan:  1. Fibromyalgia syndrome. I think this is accounting for most of her back pain. Continue Lyrica 100 mg 3 times a day she did not tolerate a higher dose of this. Continue duloxetine 30 g per day  2. Possible neuropathy, I do not have the nerve study report. Patient did receive chemotherapy during her cancer treatment. No history of diabetes.  We'll continue taking Lyrica and duloxetine  3. Lumbar postlaminectomy syndrome. Continue tramadol 100 mg 3 times a day We discussed that the use of hydrocodone should be limited postoperative times.  4. Shoulder pain, likely multifactorial. Fibromyalgia, has been diagnosed with shoulder bursitis as well. Has been seen by orthopedist.

## 2016-07-05 MED FILL — ANASTROZOLE 1 MG TABLET: 1 | 30 days supply | Qty: 30 | Fill #0

## 2016-07-05 MED FILL — traMADol HCL 50 MG TABS: 50 | 30 days supply | Qty: 180 | Fill #0

## 2016-07-05 MED FILL — LYRICA 100 MG CAPSULE: 100 | 30 days supply | Qty: 90 | Fill #3

## 2016-07-06 ENCOUNTER — Ambulatory Visit: Payer: Medicare Other | Admitting: Podiatry

## 2016-07-18 ENCOUNTER — Other Ambulatory Visit (HOSPITAL_COMMUNITY): Payer: Self-pay | Admitting: Specialist

## 2016-07-18 DIAGNOSIS — R1319 Other dysphagia: Secondary | ICD-10-CM

## 2016-07-19 ENCOUNTER — Ambulatory Visit: Payer: Medicare Other | Attending: Specialist | Admitting: Physical Therapy

## 2016-07-19 DIAGNOSIS — M6281 Muscle weakness (generalized): Secondary | ICD-10-CM

## 2016-07-19 DIAGNOSIS — R2689 Other abnormalities of gait and mobility: Secondary | ICD-10-CM | POA: Insufficient documentation

## 2016-07-19 DIAGNOSIS — R293 Abnormal posture: Secondary | ICD-10-CM

## 2016-07-19 DIAGNOSIS — M62830 Muscle spasm of back: Secondary | ICD-10-CM | POA: Diagnosis present

## 2016-07-19 DIAGNOSIS — M545 Low back pain: Secondary | ICD-10-CM | POA: Diagnosis not present

## 2016-07-19 DIAGNOSIS — G8929 Other chronic pain: Secondary | ICD-10-CM | POA: Insufficient documentation

## 2016-07-19 NOTE — Therapy (Signed)
[place], [place], [address] Phone: [phone]   Fax:  [phone]  Physical Therapy Evaluation  Patient Details  Name: Kelsey Wilson MRN: [id number] Date of Birth: 23-Apr-1961 Referring Provider: Gomez Cleverly MD  Encounter Date: 07/19/2016      PT End of Session - 07/19/16 1021    Visit Number 1   Number of Visits 13   Date for PT Re-Evaluation 09/13/16   Authorization Type Medicare: Kx mod by 15th visit, progress note by 10th visits   PT Start Time 0946  pt arrived 16 min late   PT Stop Time 1015   PT Time Calculation (min) 29 min   Activity Tolerance Patient tolerated treatment well   Behavior During Therapy [facility] for tasks assessed/performed      Past Medical History:  Diagnosis Date  . Anemia   . Anxiety   . Arthritis   . Breast cancer ([place]) 2010   T3N1 invasive ductal carcinoma left breast.Takes Arimidex daily  . Bursitis   . Carpal tunnel syndrome   . Chronic back pain    stenosis  . Constipation    takes Colace daily  . Depression    takes Benzotropine daily  . Diverticulitis of colon   . Fibromyalgia 08/2012  . GERD (gastroesophageal reflux disease)    takes Dexilant daily  . Hemorrhoid   . History of blood transfusion    no abnormal reaction noted  . History of colon polyps    benign  . History of shingles   . Joint pain   . Joint swelling   . Night muscle spasms    takes Flexeril nightly as needed  . Nocturia   . Peripheral edema    takes Furosemide.Just started 01/18/16  . Peripheral neuropathy (HCC)    takes Lyrica daily  . Pneumonia    hx of-2015  . Seasonal allergies    takes Singulair nightly  . SOB (shortness of breath) on exertion    rarely with exertion  . 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 Alphonzo Grieve)    Past Surgical History:   Procedure Laterality Date  . ABDOMINAL HYSTERECTOMY     still has ovaries  . APPENDECTOMY    . AXILLARY LYMPH NODE DISSECTION  11/28/2011   Procedure: AXILLARY LYMPH NODE DISSECTION;  Surgeon: Edward Jolly, MD;  Location: [facility];  Service: General;  Laterality: Left;  . BREAST SURGERY Left 2013  . CARPAL TUNNEL RELEASE     Bilateral  . CHOLECYSTECTOMY    . COLONOSCOPY    . EYE SURGERY Left    cataract removal  . KNEE SURGERY     Left Knee  . LUMBAR LAMINECTOMY/DECOMPRESSION MICRODISCECTOMY Bilateral 01/27/2016   Procedure: Laminectomy and Foraminotomy - Lumbar four -Lumbar five - bilateral- on-lay noninstrumented fusion;  Surgeon: Eustace Moore, MD;  Location: [facility];  Service: Neurosurgery;  Laterality: Bilateral;  . MULTIPLE EXTRACTIONS WITH ALVEOLOPLASTY N/A 05/11/2016   Procedure: EXTRACTION OF TEETH EIGHTEEN, TWENTY AND TWENTY- NINE;  REMOVAL OF MANDIBULAR TORUS AND EXOSTOSIS;  Surgeon: Diona Browner, DDS;  Location: [facility];  Service: Oral Surgery;  Laterality: N/A;  . PORTACATH PLACEMENT  06/18/2011   Procedure: INSERTION PORT-A-CATH;  Surgeon: Edward Jolly, MD;  Location: [facility];  Service: General;  Laterality: Right;  right subclavian  . removal portacath  2014  .  spur     Apex spur on both big toes  . TOE SURGERY Bilateral   . TONSILLECTOMY    . TOTAL KNEE ARTHROPLASTY Left 09/13/2014  . TOTAL KNEE ARTHROPLASTY Left 09/13/2014   Procedure: LEFT TOTAL KNEE ARTHROPLASTY;  Surgeon: Rod Can, MD;  Location: [facility];  Service: Orthopedics;  Laterality: Left;  . TOTAL KNEE ARTHROPLASTY Right 03/10/2015   Procedure: RIGHT TOTAL KNEE ARTHROPLASTY;  Surgeon: Rod Can, MD;  [facility];  Service: Orthopedics;  Laterality: Right;    There were no vitals filed for this visit.       Subjective Assessment - 07/19/16 0957    Subjective pt is a 56 y.o F with CC full body chronic pain but reports at the moment the low back is bother her  the most. The back pain has been going on for years with no insidous onset. reports N/T and pain going down both legs in the feet. pain starts in the upper mid back and radaites down the back in to the legs. reports no bowel or bladder incontinence, reports numbness tingling in the saddle region but has had that for along time.     Limitations Sitting;Lifting;Walking;Standing;House hold activities   How long can you sit comfortably? 2-5 min   How long can you stand comfortably? 5 min   How long can you walk comfortably? 5 min   Diagnostic tests x-ray/ MRI   Patient Stated Goals help with exercise, increase strength, relieve pain,    Currently in Pain? Yes   Pain Score 10-Worst pain ever  pt declined trip to ED despite pain being 10/10   Pain Location Back   Pain Orientation Lower;Mid;Right;Left   Pain Descriptors / Indicators Aching;Sore   Pain Type Chronic pain   Pain Onset More than a month ago   Pain Frequency Constant   Aggravating Factors  walking/ standing, sitting, twisting/ bending, lifting   Pain Relieving Factors moving around, getting up and walking            [facility] PT Assessment - 07/19/16 0956      Assessment   Medical Diagnosis Fibformyalgia, polyneuropathy, muscle spams of the back.    Referring Provider Gomez Cleverly MD   Onset Date/Surgical Date --  many years   Hand Dominance Right   Next MD Visit 2 weeks   Prior Therapy yes     Precautions   Precautions Other (comment)  reports not supposed to be doing anything     Restrictions   Weight Bearing Restrictions No     Balance Screen   Has the patient fallen in the past 6 months No   Has the patient had a decrease in activity level because of a fear of falling?  No   Is the patient reluctant to leave their home because of a fear of falling?  No     Home Ecologist residence     Prior Function   Level of Independence Independent with basic ADLs     Observation/Other  Assessments   Focus on Therapeutic Outcomes (FOTO)  60% limited   predicted 55% limited     Posture/Postural Control   Posture/Postural Control Postural limitations   Postural Limitations Rounded Shoulders;Forward head;Increased lumbar lordosis     ROM / Strength   AROM / PROM / Strength AROM;Strength     AROM   AROM Assessment Site Lumbar   Lumbar Flexion 16   Lumbar Extension 8   Lumbar -  Right Side Bend 2  more upper thoracic movement   Lumbar - Left Side Bend 2  more upper thoracic movement     Strength   Overall Strength Due to pain   Strength Assessment Site Hip;Knee   Right/Left Hip Right;Left   Right Hip Flexion 3-/5   Right Hip Extension 3-/5   Right Hip ABduction 2+/5   Right Hip ADduction 3-/5   Left Hip Flexion 3-/5   Left Hip Extension 3-/5   Left Hip ABduction 2+/5   Left Hip ADduction 3-/5   Right/Left Knee Right;Left   Right Knee Flexion 3/5   Right Knee Extension 3/5   Left Knee Flexion 3/5   Left Knee Extension 3/5     Palpation   Palpation comment tightness in bil thoracolumbar paraspinals and bil upper tap tightness with pain that refers down the back.      Ambulation/Gait   Ambulation/Gait Yes   Assistive device Straight cane   Gait Pattern Step-through pattern;Decreased stride length;Decreased stance time - right;Decreased stance time - left;Trendelenburg;Shuffle;Trunk flexed;Antalgic;Lateral hip instability;Decreased trunk rotation                           PT Education - 07/19/16 1020    Education provided Yes   Education Details evaluation findings, POC, goals, HEP with proper form and treatment rationale. Education regarding that should see some progress within the next 6-8 visits if no progress is made then may have to refer back to the MD.    Person(s) Educated Patient   Methods Explanation;Verbal cues;Handout   Comprehension Verbalized understanding;Verbal cues required          PT Short Term Goals - 07/19/16  1028      PT SHORT TERM GOAL #1   Title pt will be I with inital HEP (08/09/2016)   Time 3   Period Weeks   Status New     PT SHORT TERM GOAL #2   Title she will be able to verbalize and demo proper posture, walking mechanics and bending/ lifting mechanics to prevent and reduce low back pain (08/09/2016)   Time 3   Period Weeks   Status New     PT SHORT TERM GOAL #3   Title pt will demonstrate reduce muscle spasm / tightness in the mid/ low back to reduce pain to </= 8/10 pain and promote trunk mobility (08/09/2016)   Time 3   Period Weeks   Status New           PT Long Term Goals - 07/19/16 1030      PT LONG TERM GOAL #1   Title she will be I with all HEP given as of last visit (09/13/2016)   Time 6   Period Weeks   Status New     PT LONG TERM GOAL #2   Title pt will increase trunk mobility by >/= 10 degrees in all planes with </= 5/10 pain for mobility required for ADLS ([id number])   Time 6   Period Weeks   Status New     PT LONG TERM GOAL #3   Title she will increase bil LE strength to >/= 4/5 for prolonged standing/ walking and maintaining safety (09/13/2016)   Time 6   Period Weeks   Status New     PT LONG TERM GOAL #4   Title she will be able to sit/ stand/ walk for >/= 20 min with </= 5/10 pain for functional  endurance required for ADLS and community mobility (09/13/2016)   Time 6   Period Weeks   Status New     PT LONG TERM GOAL #5   Title she will increase her FOTO score to </= 58% limited to demonstate improvement in function (09/13/2016)   Time 6   Period Weeks   Status New               Plan - 07/19/16 1022    Clinical Impression Statement mrs. roszak presents to OPPT as a high complexity evaluation based on involved PMHx, severe 10/10 pain in theback and in multiple joints, and evaluation findings. Limited evaluation due to pt arriving 16 minutes late.  Significant limited trunk mobility in all planes due to pain and muscle tightness. significant  weakness in bil LE with pain during testing. tenderness noted in bil thoracolumbar paraspinals. antalgic gait pattern with decreased stride and trendelenberg pattern with SPC. She would benefit form physical therapy to decrease pain, improve mobility/ posture and maximize her function by addressing the deficits listed below.    Rehab Potential Good   PT Frequency 2x / week   PT Duration 6 weeks   PT Treatment/Interventions ADLs/Self Care Home Management;Cryotherapy;Electrical Stimulation;Moist Heat;Ultrasound;Functional mobility training;Iontophoresis 4mg /ml Dexamethasone;Gait training;Therapeutic exercise;Therapeutic activities;Patient/family education;Dry needling;Taping;Manual techniques;Passive range of motion   PT Next Visit Plan assess/ reivew HEP, manual, core strenghtening, hip strengthening, trunk mobiltiy/ mobs. (hx of CX no heat/stim until oncologist has signed off)   PT Home Exercise Plan lower trunk rotation,  sidelying clam shells, supine marching, glut set   Consulted and Agree with Plan of Care Patient      Patient will benefit from skilled therapeutic intervention in order to improve the following deficits and impairments:  Abnormal gait, Pain, Increased fascial restricitons, Difficulty walking, Decreased range of motion, Decreased strength, Decreased mobility, Decreased endurance, Decreased activity tolerance, Postural dysfunction, Improper body mechanics, Hypomobility  Visit Diagnosis: Chronic bilateral low back pain, with sciatica presence unspecified - Plan: PT plan of care cert/re-cert  Muscle weakness (generalized) - Plan: PT plan of care cert/re-cert  Abnormal posture - Plan: PT plan of care cert/re-cert  Other abnormalities of gait and mobility - Plan: PT plan of care cert/re-cert  Muscle spasm of back - Plan: PT plan of care cert/re-cert      G-Codes - [id number]    Functional Assessment Tool Used FOTO/ clinical judgment   Functional Limitation Mobility:  Walking and moving around   Mobility: Walking and Moving Around Current Status [phone]) At least 60 percent but less than 80 percent impaired, limited or restricted   Mobility: Walking and Moving Around Goal Status [phone]) At least 40 percent but less than 60 percent impaired, limited or restricted       Problem List Patient Active Problem List   Diagnosis Date Noted  . Neuropathic pain 06/25/2016  . 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 S/P lumbar spinal fusion 01/27/2016  . H/O therapeutic radiation 09/21/2015  . Pes planus of both feet 08/04/2015  . Primary osteoarthritis of right knee 03/10/2015  . Osteoarthritis of right knee 02/04/2015  . Primary osteoarthritis of left knee 09/13/2014  . Hot flashes 01/19/2014  . Abdominal pain, unspecified site 01/19/2014  . Malignant neoplasm of upper-outer quadrant of left breast in female, estrogen receptor positive ([place]) 03/19/2013  . Lumbosacral spondylosis without myelopathy 12/30/2012  . Fibromyalgia syndrome 08/15/2012  . Peripheral neuropathy, toxic 08/15/2012  . Lymphedema of arm - left 04/30/2012   Starr Lake PT,  DPT, LAT, ATC  07/19/16  10:37 AM      [facility] [address] [place], [place], [address] Phone: [phone]   Fax:  [phone]  Name: Kelsey Wilson MRN: [id number] Date of Birth: 1960-11-23

## 2016-07-23 ENCOUNTER — Ambulatory Visit (HOSPITAL_BASED_OUTPATIENT_CLINIC_OR_DEPARTMENT_OTHER): Payer: Medicare Other | Attending: Specialist | Admitting: Internal Medicine

## 2016-07-23 VITALS — Ht 63.0 in | Wt 265.0 lb

## 2016-07-23 DIAGNOSIS — R0683 Snoring: Secondary | ICD-10-CM | POA: Diagnosis present

## 2016-07-23 DIAGNOSIS — G4733 Obstructive sleep apnea (adult) (pediatric): Secondary | ICD-10-CM | POA: Insufficient documentation

## 2016-07-23 DIAGNOSIS — R0902 Hypoxemia: Secondary | ICD-10-CM | POA: Diagnosis not present

## 2016-07-23 DIAGNOSIS — R5383 Other fatigue: Secondary | ICD-10-CM

## 2016-07-23 DIAGNOSIS — G4700 Insomnia, unspecified: Secondary | ICD-10-CM | POA: Diagnosis present

## 2016-07-24 ENCOUNTER — Ambulatory Visit (INDEPENDENT_AMBULATORY_CARE_PROVIDER_SITE_OTHER): Payer: Medicare Other | Admitting: Podiatry

## 2016-07-24 ENCOUNTER — Encounter: Payer: Self-pay | Admitting: Podiatry

## 2016-07-24 ENCOUNTER — Ambulatory Visit (HOSPITAL_COMMUNITY)
Admission: RE | Admit: 2016-07-24 | Discharge: 2016-07-24 | Disposition: A | Discharge status: Home / Self Care | Payer: Medicare Other | Source: Ambulatory Visit | Attending: Specialist | Admitting: Specialist

## 2016-07-24 ENCOUNTER — Ambulatory Visit: Payer: Medicare Other | Admitting: Physical Therapy

## 2016-07-24 DIAGNOSIS — G8929 Other chronic pain: Secondary | ICD-10-CM

## 2016-07-24 DIAGNOSIS — M62830 Muscle spasm of back: Secondary | ICD-10-CM

## 2016-07-24 DIAGNOSIS — R2689 Other abnormalities of gait and mobility: Secondary | ICD-10-CM

## 2016-07-24 DIAGNOSIS — R1319 Other dysphagia: Secondary | ICD-10-CM | POA: Diagnosis present

## 2016-07-24 DIAGNOSIS — M79676 Pain in unspecified toe(s): Secondary | ICD-10-CM

## 2016-07-24 DIAGNOSIS — M545 Low back pain: Secondary | ICD-10-CM | POA: Diagnosis not present

## 2016-07-24 DIAGNOSIS — B351 Tinea unguium: Secondary | ICD-10-CM | POA: Diagnosis not present

## 2016-07-24 DIAGNOSIS — R293 Abnormal posture: Secondary | ICD-10-CM

## 2016-07-24 DIAGNOSIS — M6281 Muscle weakness (generalized): Secondary | ICD-10-CM

## 2016-07-24 IMAGING — RF DG SWALLOWING FUNCTION
1 series · 17 of 24 positions shown · non-contrast
Comparison: None.

CLINICAL DATA: Dysphagia.

EXAM:
MODIFIED BARIUM SWALLOW
TECHNIQUE: Different consistencies of barium were administered orally to the
patient by the Speech Pathologist. Imaging of the pharynx was
performed in the lateral projection.
FLUOROSCOPY TIME:  Fluoroscopy Time:  1 minutes and 0 seconds
Radiation Exposure Index (if provided by the fluoroscopic device):
None.
Number of Acquired Spot Images: 0

[Series 1: run · 11 acquisitions, 17 frames shown]
[im 1/11]
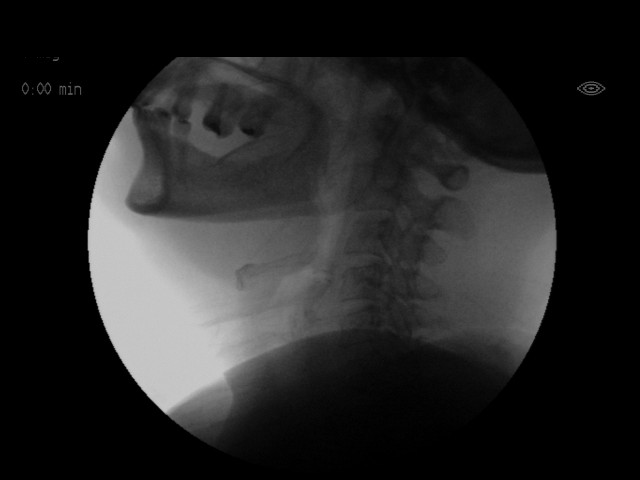
[im 2/11]
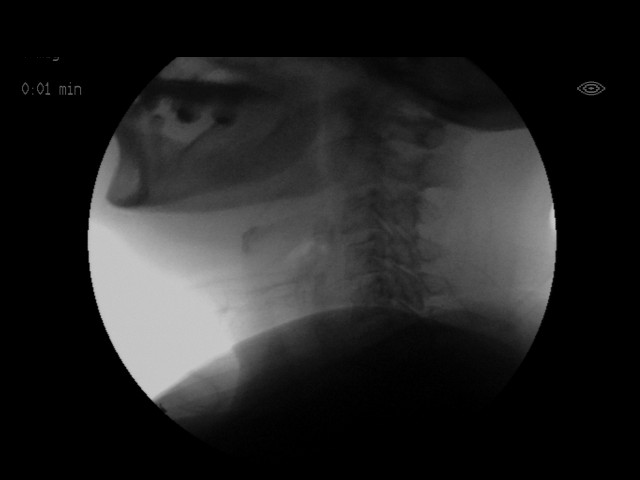
[im 2/11]
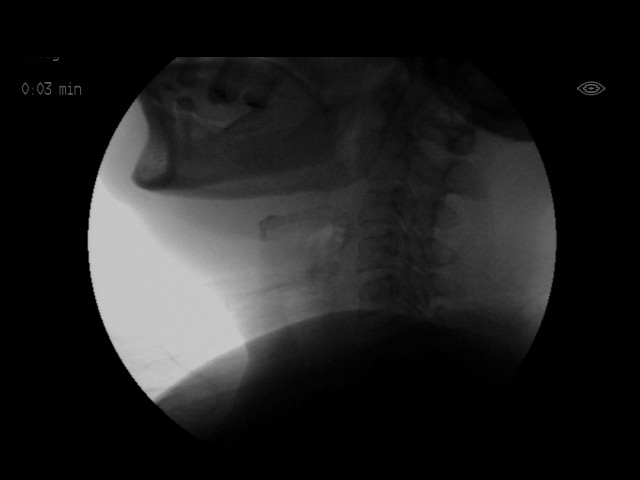
[im 2/11]
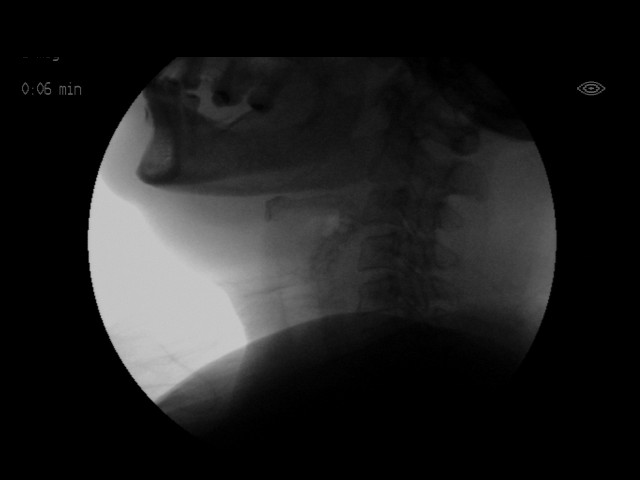
[im 3/11]
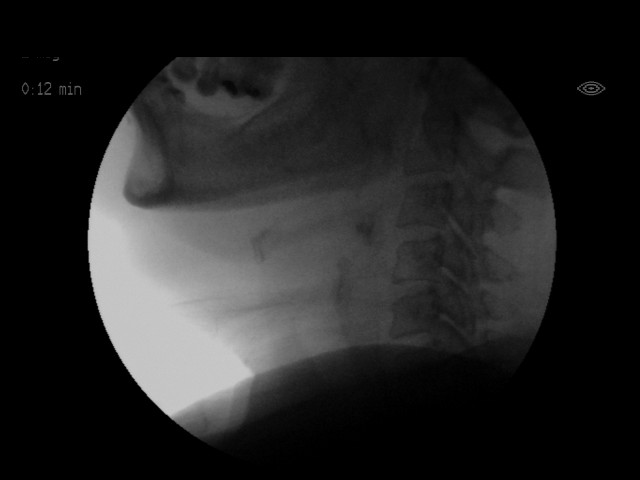
[im 4/11]
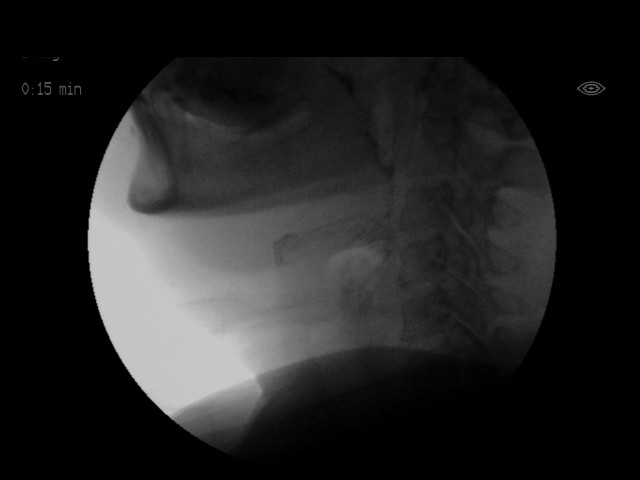
[im 5/11]
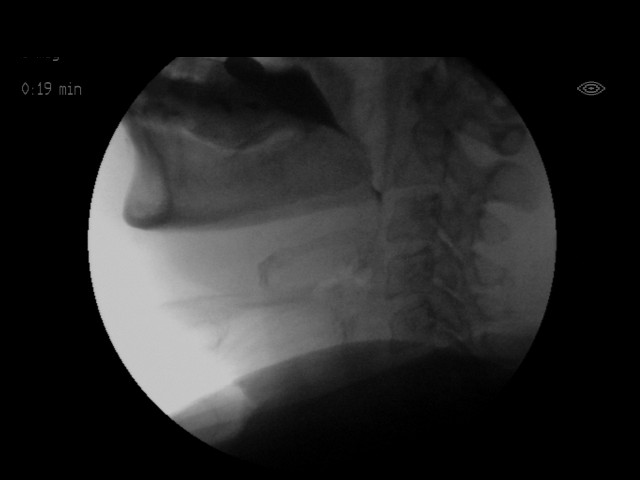
[im 5/11]
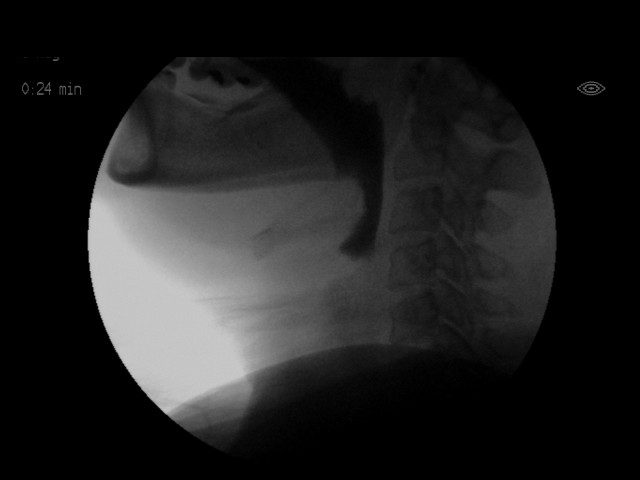
[im 6/11]
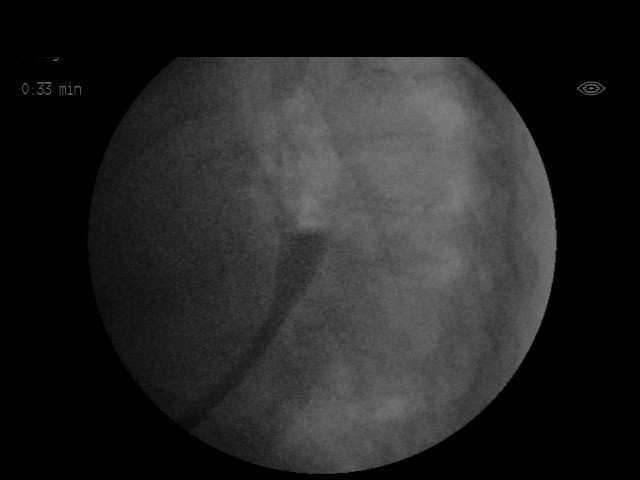
[im 7/11]
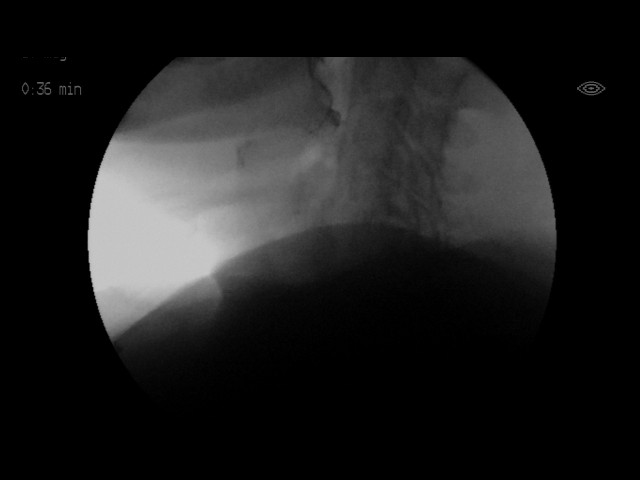
[im 7/11]
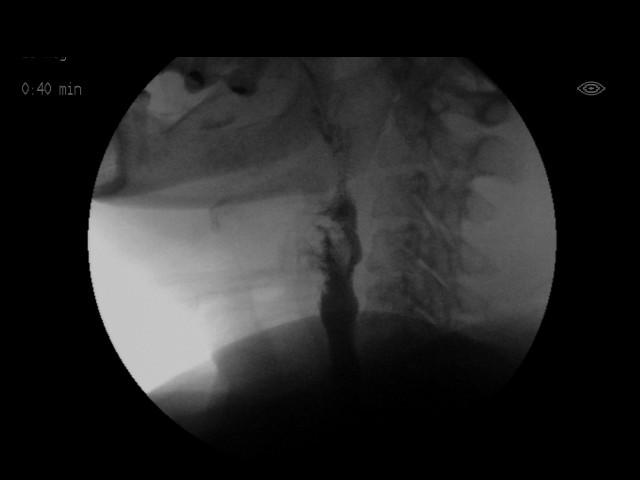
[im 8/11]
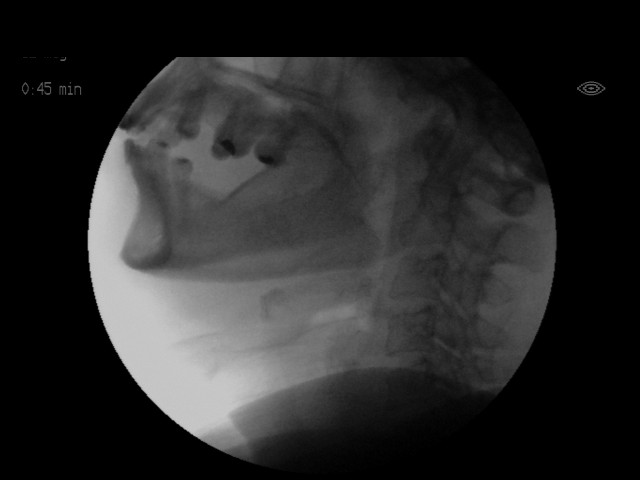
[im 9/11]
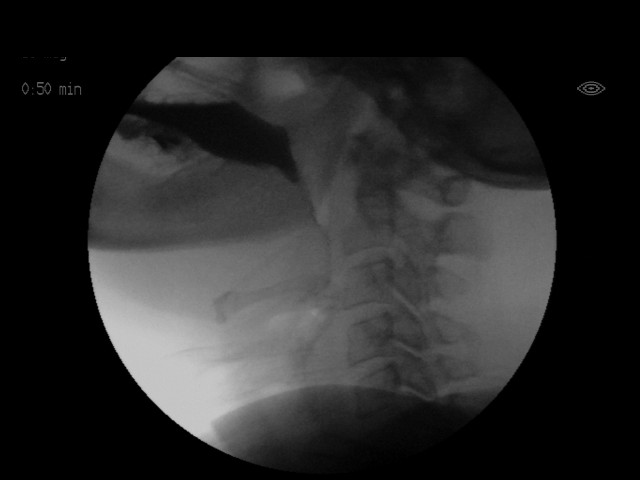
[im 10/11]
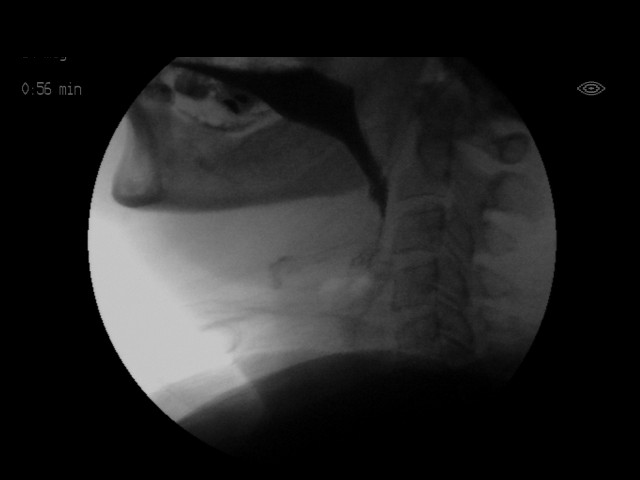
[im 10/11]
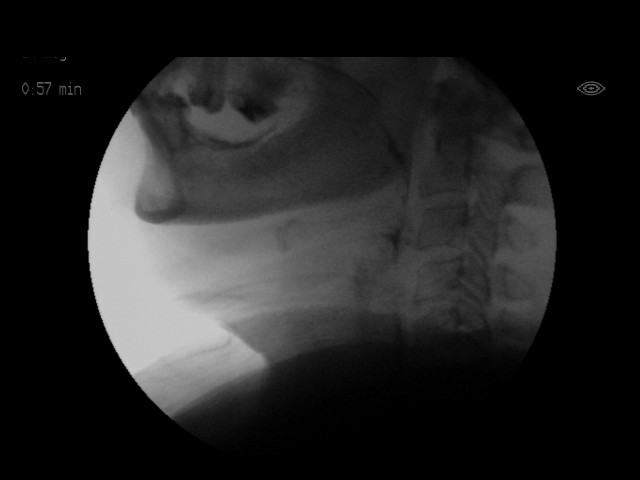
[im 10/11]
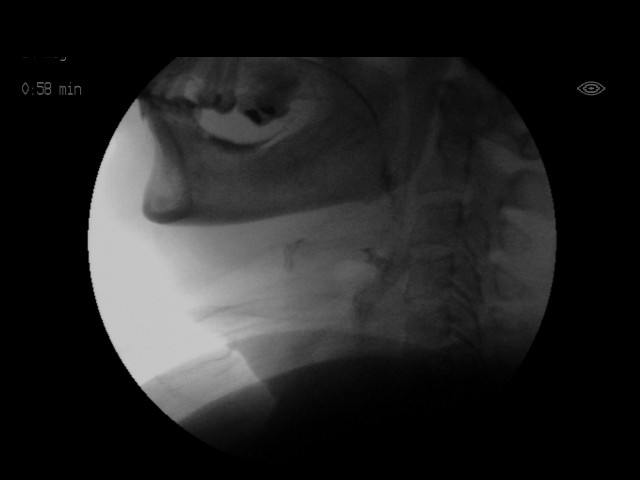
[im 11/11]
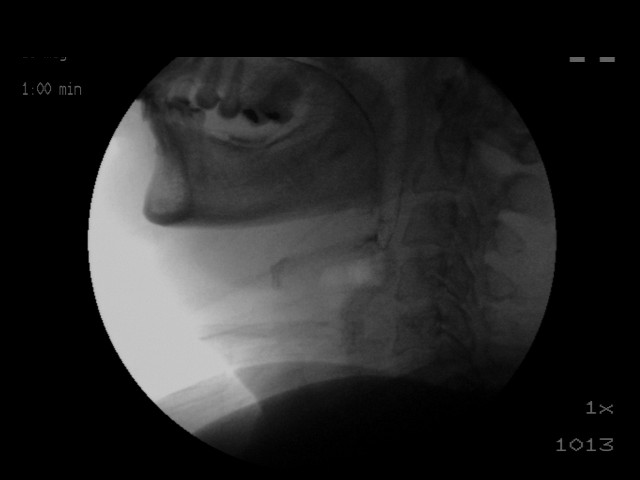

[17 of 24 positions shown; findings below may reference images not displayed]

FINDINGS: Thin liquid- slight delayed oral transit. Minimal flash penetration
when using a straw on 1 swallow. Otherwise normal.

Nectar thick liquid- not performed

Honey- not performed

Cataloni?Ps Rafael not performed

Cracker-not performed

Cataloni?Beatriz Amparo with cracker- normal

Barium tablet -  within normal limits
IMPRESSION: 1. Poor oral control with delayed oral transit.
2. No laryngeal penetration or aspiration.

Please refer to the Speech Pathologists report for complete details
and recommendations.

## 2016-07-24 NOTE — Therapy (Signed)
[place], [place], [address] Phone: [phone]   Fax:  [phone]  Physical Therapy Treatment  Patient Details  Name: Kelsey Wilson MRN: [id number] Date of Birth: 04-21-1961 Referring Provider: Gomez Cleverly MD  Encounter Date: 07/24/2016      PT End of Session - 07/24/16 0911    Visit Number 2   Number of Visits 13   Date for PT Re-Evaluation 09/13/16   Authorization Type Medicare: Kx mod by 15th visit, progress note by 10th visits   PT Start Time 0852  7 minutes late   PT Stop Time 0930   PT Time Calculation (min) 38 min   Activity Tolerance Patient tolerated treatment well   Behavior During Therapy [facility] for tasks assessed/performed      Past Medical History:  Diagnosis Date  . Anemia   . Anxiety   . Arthritis   . Breast cancer ([place]) 2010   T3N1 invasive ductal carcinoma left breast.Takes Arimidex daily  . Bursitis   . Carpal tunnel syndrome   . Chronic back pain    stenosis  . Constipation    takes Colace daily  . Depression    takes Benzotropine daily  . Diverticulitis of colon   . Fibromyalgia 08/2012  . GERD (gastroesophageal reflux disease)    takes Dexilant daily  . Hemorrhoid   . History of blood transfusion    no abnormal reaction noted  . History of colon polyps    benign  . History of shingles   . Joint pain   . Joint swelling   . Night muscle spasms    takes Flexeril nightly as needed  . Nocturia   . Peripheral edema    takes Furosemide.Just started 01/18/16  . Peripheral neuropathy (HCC)    takes Lyrica daily  . Pneumonia    hx of-2015  . Seasonal allergies    takes Singulair nightly  . SOB (shortness of breath) on exertion    rarely with exertion  . 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 Alphonzo Grieve)    Past Surgical History:  Procedure  Laterality Date  . ABDOMINAL HYSTERECTOMY     still has ovaries  . APPENDECTOMY    . AXILLARY LYMPH NODE DISSECTION  11/28/2011   Procedure: AXILLARY LYMPH NODE DISSECTION;  Surgeon: Edward Jolly, MD;  Location: [place];  Service: General;  Laterality: Left;  . BREAST SURGERY Left 2013  . CARPAL TUNNEL RELEASE     Bilateral  . CHOLECYSTECTOMY    . COLONOSCOPY    . EYE SURGERY Left    cataract removal  . KNEE SURGERY     Left Knee  . LUMBAR LAMINECTOMY/DECOMPRESSION MICRODISCECTOMY Bilateral 01/27/2016   Procedure: Laminectomy and Foraminotomy - Lumbar four -Lumbar five - bilateral- on-lay noninstrumented fusion;  Surgeon: Eustace Moore, MD;  Location: [place] [facility];  Service: Neurosurgery;  Laterality: Bilateral;  . MULTIPLE EXTRACTIONS WITH ALVEOLOPLASTY N/A 05/11/2016   Procedure: EXTRACTION OF TEETH EIGHTEEN, TWENTY AND TWENTY- NINE;  REMOVAL OF MANDIBULAR TORUS AND EXOSTOSIS;  Surgeon: Diona Browner, DDS;  Location: [place];  Service: Oral Surgery;  Laterality: N/A;  . PORTACATH PLACEMENT  06/18/2011   Procedure: INSERTION PORT-A-CATH;  Surgeon: Edward Jolly, MD;  Location: [place];  Service: General;  Laterality: Right;  right subclavian  . removal portacath  2014  . spur  Apex spur on both big toes  . TOE SURGERY Bilateral   . TONSILLECTOMY    . TOTAL KNEE ARTHROPLASTY Left 09/13/2014  . TOTAL KNEE ARTHROPLASTY Left 09/13/2014   Procedure: LEFT TOTAL KNEE ARTHROPLASTY;  Surgeon: Rod Can, MD;  Location: [place];  Service: Orthopedics;  Laterality: Left;  . TOTAL KNEE ARTHROPLASTY Right 03/10/2015   Procedure: RIGHT TOTAL KNEE ARTHROPLASTY;  Surgeon: Rod Can, MD;  Location: [facility];  Service: Orthopedics;  Laterality: Right;    There were no vitals filed for this visit.      Subjective Assessment - 07/24/16 0855    Subjective I fell out of the bed and had increased pain so I stopped doing my exercises the last couple of days.    Currently  in Pain? Yes   Pain Score 9    Pain Location Back   Pain Orientation Lower;Mid   Pain Descriptors / Indicators Aching   Pain Type Chronic pain                         [facility] Adult PT Treatment/Exercise - 07/24/16 0001      Lumbar Exercises: Stretches   Passive Hamstring Stretch 2 reps;30 seconds   Single Knee to Chest Stretch 3 reps;30 seconds   Single Knee to Chest Stretch Limitations with towel   Lower Trunk Rotation 5 reps;10 seconds     Lumbar Exercises: Supine   Ab Set 10 reps   Glut Set 20 reps;5 seconds   Bent Knee Raise 20 reps     Lumbar Exercises: Sidelying   Clam 20 reps   Clam Limitations with abdominal draw in     Knee/Hip Exercises: Stretches   Hip Flexor Stretch 2 reps;30 seconds   Hip Flexor Stretch Limitations side edge of mat with opposite knee bent.                   PT Short Term Goals - 07/19/16 1028      PT SHORT TERM GOAL #1   Title pt will be I with inital HEP (08/09/2016)   Time 3   Period Weeks   Status New     PT SHORT TERM GOAL #2   Title she will be able to verbalize and demo proper posture, walking mechanics and bending/ lifting mechanics to prevent and reduce low back pain (08/09/2016)   Time 3   Period Weeks   Status New     PT SHORT TERM GOAL #3   Title pt will demonstrate reduce muscle spasm / tightness in the mid/ low back to reduce pain to </= 8/10 pain and promote trunk mobility (08/09/2016)   Time 3   Period Weeks   Status New           PT Long Term Goals - 07/19/16 1030      PT LONG TERM GOAL #1   Title she will be I with all HEP given as of last visit (09/13/2016)   Time 6   Period Weeks   Status New     PT LONG TERM GOAL #2   Title pt will increase trunk mobility by >/= 10 degrees in all planes with </= 5/10 pain for mobility required for ADLS ([id number])   Time 6   Period Weeks   Status New     PT LONG TERM GOAL #3   Title she will increase bil LE strength to >/= 4/5 for prolonged  standing/ walking and maintaining  safety (09/13/2016)   Time 6   Period Weeks   Status New     PT LONG TERM GOAL #4   Title she will be able to sit/ stand/ walk for >/= 20 min with </= 5/10 pain for functional endurance required for ADLS and community mobility (09/13/2016)   Time 6   Period Weeks   Status New     PT LONG TERM GOAL #5   Title she will increase her FOTO score to </= 58% limited to demonstate improvement in function (09/13/2016)   Time 6   Period Weeks   Status New               Plan - 07/24/16 [id number]    Clinical Impression Statement Pt reports 9/10 pain in her mid to lower back. She declines going to the emergency room. Reviewed HEP, pt requires minimal cues and has no c/o of increased pain. Began knee to chest and hamstring stretches without difficulty or tightness. Began hip flexor stretch. Pt demonstrates tightness in hip flexors.    PT Next Visit Plan assess/ reivew HEP, manual, core strenghtening, hip strengthening, trunk mobiltiy/ mobs. (hx of CX no heat/stim until oncologist has signed off)   PT Home Exercise Plan lower trunk rotation,  sidelying clam shells, supine marching, glut set, add hip flexor stretch?   Consulted and Agree with Plan of Care Patient      Patient will benefit from skilled therapeutic intervention in order to improve the following deficits and impairments:  Abnormal gait, Pain, Increased fascial restricitons, Difficulty walking, Decreased range of motion, Decreased strength, Decreased mobility, Decreased endurance, Decreased activity tolerance, Postural dysfunction, Improper body mechanics, Hypomobility  Visit Diagnosis: Chronic bilateral low back pain, with sciatica presence unspecified  Muscle weakness (generalized)  Abnormal posture  Muscle spasm of back  Other abnormalities of gait and mobility     Problem List Patient Active Problem List   Diagnosis Date Noted  . Neuropathic pain 06/25/2016  . 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 S/P lumbar spinal fusion 01/27/2016  . H/O therapeutic radiation 09/21/2015  . Pes planus of both feet 08/04/2015  . Primary osteoarthritis of right knee 03/10/2015  . Osteoarthritis of right knee 02/04/2015  . Primary osteoarthritis of left knee 09/13/2014  . Hot flashes 01/19/2014  . Abdominal pain, unspecified site 01/19/2014  . Malignant neoplasm of upper-outer quadrant of left breast in female, estrogen receptor positive ([place]) 03/19/2013  . Lumbosacral spondylosis without myelopathy 12/30/2012  . Fibromyalgia syndrome 08/15/2012  . Peripheral neuropathy, toxic 08/15/2012  . Lymphedema of arm - left 04/30/2012    Dorene Ar, PTA 07/24/2016, 9:28 AM  [place], [place], [address] Phone: [phone]   Fax:  [phone]  Name: AEJA GARNEY MRN: [id number] Date of Birth: [birth year]

## 2016-07-24 NOTE — Progress Notes (Signed)
Subjective:     Patient ID: Kelsey Wilson, female   DOB: [birth year], 56 y.o.   MRN: [id number]  HPIThis patient returns for nail care both feet.  Patient says the nails are painful walking and wearing her shoes.  She says her second toe right foot is especially painful. She presents for preventive foot care services.   Review of Systems     Objective:   Physical Exam GENERAL APPEARANCE: Alert, conversant. Appropriately groomed. No acute distress.  VASCULAR: Pedal pulses palpable at 2/4 DP and PT bilateral.  Capillary refill time is immediate to all digits,  Proximal to distal cooling it warm to warm.  Digital hair growth is present bilateral  NEUROLOGIC: sensation is intact epicritically and protectively to 5.07 monofilament at 5/5 sites bilateral.  Light touch is intact bilateral, vibratory sensation intact bilateral, achilles tendon reflex is intact bilateral.  MUSCULOSKELETAL: acceptable muscle strength, tone and stability bilateral.  Intrinsic muscluature intact bilateral.  HAV  B/L  DERMATOLOGIC: skin color, texture, and turgor are within normal limits.  No preulcerative lesions or ulcers  are seen, no interdigital maceration noted.  No open lesions present.  . No drainage noted. NAILS  Thick disfigured discolored nails both feet x 10      Assessment:     Onychomycosis    Plan:  Debridement of onychomycotic nails.  RTC  3 months. Discussed permanent removal of second toe right foot.      Gardiner Barefoot DPM

## 2016-07-26 ENCOUNTER — Ambulatory Visit: Payer: Medicare Other | Admitting: Physical Therapy

## 2016-07-26 DIAGNOSIS — G8929 Other chronic pain: Secondary | ICD-10-CM

## 2016-07-26 DIAGNOSIS — M545 Low back pain: Secondary | ICD-10-CM | POA: Diagnosis not present

## 2016-07-26 DIAGNOSIS — R2689 Other abnormalities of gait and mobility: Secondary | ICD-10-CM

## 2016-07-26 DIAGNOSIS — M62830 Muscle spasm of back: Secondary | ICD-10-CM

## 2016-07-26 DIAGNOSIS — R293 Abnormal posture: Secondary | ICD-10-CM

## 2016-07-26 DIAGNOSIS — M6281 Muscle weakness (generalized): Secondary | ICD-10-CM

## 2016-07-26 NOTE — Therapy (Signed)
[place], [place], [address] Phone: [phone]   Fax:  [phone]  Physical Therapy Treatment  Patient Details  Name: Kelsey Wilson MRN: [id number] Date of Birth: 1960-09-29 Referring Provider: Gomez Cleverly MD  Encounter Date: 07/26/2016      PT End of Session - 07/26/16 1207    Visit Number 3   Number of Visits 13   Date for PT Re-Evaluation 09/13/16   Authorization Type Medicare: Kx mod by 15th visit, progress note by 10th visits   PT Start Time 1140   PT Stop Time 1220   PT Time Calculation (min) 40 min      Past Medical History:  Diagnosis Date  . Anemia   . Anxiety   . Arthritis   . Breast cancer ([place]) 2010   T3N1 invasive ductal carcinoma left breast.Takes Arimidex daily  . Bursitis   . Carpal tunnel syndrome   . Chronic back pain    stenosis  . Constipation    takes Colace daily  . Depression    takes Benzotropine daily  . Diverticulitis of colon   . Fibromyalgia 08/2012  . GERD (gastroesophageal reflux disease)    takes Dexilant daily  . Hemorrhoid   . History of blood transfusion    no abnormal reaction noted  . History of colon polyps    benign  . History of shingles   . Joint pain   . Joint swelling   . Night muscle spasms    takes Flexeril nightly as needed  . Nocturia   . Peripheral edema    takes Furosemide.Just started 01/18/16  . Peripheral neuropathy (HCC)    takes Lyrica daily  . Pneumonia    hx of-2015  . Seasonal allergies    takes Singulair nightly  . SOB (shortness of breath) on exertion    rarely with exertion  . 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 Alphonzo Grieve)    Past Surgical History:  Procedure Laterality Date  . ABDOMINAL HYSTERECTOMY     still has ovaries  . APPENDECTOMY    . AXILLARY LYMPH NODE DISSECTION  11/28/2011   Procedure: AXILLARY LYMPH NODE DISSECTION;  Surgeon: Edward Jolly, MD;  Location: [place];  Service: General;  Laterality: Left;  . BREAST SURGERY Left 2013  . CARPAL TUNNEL RELEASE     Bilateral  . CHOLECYSTECTOMY    . COLONOSCOPY    . EYE SURGERY Left    cataract removal  . KNEE SURGERY     Left Knee  . LUMBAR LAMINECTOMY/DECOMPRESSION MICRODISCECTOMY Bilateral 01/27/2016   Procedure: Laminectomy and Foraminotomy - Lumbar four -Lumbar five - bilateral- on-lay noninstrumented fusion;  Surgeon: Eustace Moore, MD;  Location: [place] [facility];  Service: Neurosurgery;  Laterality: Bilateral;  . MULTIPLE EXTRACTIONS WITH ALVEOLOPLASTY N/A 05/11/2016   Procedure: EXTRACTION OF TEETH EIGHTEEN, TWENTY AND TWENTY- NINE;  REMOVAL OF MANDIBULAR TORUS AND EXOSTOSIS;  Surgeon: Diona Browner, DDS;  Location: [place];  Service: Oral Surgery;  Laterality: N/A;  . PORTACATH PLACEMENT  06/18/2011   Procedure: INSERTION PORT-A-CATH;  Surgeon: Edward Jolly, MD;  Location: [place];  Service: General;  Laterality: Right;  right subclavian  . removal portacath  2014  . spur     Apex spur on both big toes  . TOE SURGERY Bilateral   . TONSILLECTOMY    .  TOTAL KNEE ARTHROPLASTY Left 09/13/2014  . TOTAL KNEE ARTHROPLASTY Left 09/13/2014   Procedure: LEFT TOTAL KNEE ARTHROPLASTY;  Surgeon: Rod Can, MD;  Location: [place];  Service: Orthopedics;  Laterality: Left;  . TOTAL KNEE ARTHROPLASTY Right 03/10/2015   Procedure: RIGHT TOTAL KNEE ARTHROPLASTY;  Surgeon: Rod Can, MD;  [facility];  Service: Orthopedics;  Laterality: Right;    There were no vitals filed for this visit.      Subjective Assessment - 07/26/16 1146    Subjective I felt good after last time.    Currently in Pain? Yes   Pain Score 8    Pain Location Back   Pain Orientation Mid;Lower   Pain Descriptors / Indicators Aching;Sore   Aggravating Factors  walking/standing, bending, twisting, lifting    Pain  Relieving Factors moving around, change positions                          [facility] Adult PT Treatment/Exercise - 07/26/16 0001      Lumbar Exercises: Supine   Ab Set 5 reps   AB Set Limitations focusing on breathing while maintaining core contraction    Clam 20 reps   Bent Knee Raise 20 reps   Bridge 15 reps   Bridge Limitations legs over ball    Straight Leg Raise 10 reps   Straight Leg Raises Limitations with ab set   Other Supine Lumbar Exercises ball squeeze x10   c/ o perlvic pain at end of reps      Lumbar Exercises: Sidelying   Clam 20 reps   Clam Limitations with abdominal draw in   Hip Abduction Limitations unable to abduct      Knee/Hip Exercises: Stretches   Hip Flexor Stretch 1 rep;60 seconds                  PT Short Term Goals - 07/19/16 1028      PT SHORT TERM GOAL #1   Title pt will be I with inital HEP (08/09/2016)   Time 3   Period Weeks   Status New     PT SHORT TERM GOAL #2   Title she will be able to verbalize and demo proper posture, walking mechanics and bending/ lifting mechanics to prevent and reduce low back pain (08/09/2016)   Time 3   Period Weeks   Status New     PT SHORT TERM GOAL #3   Title pt will demonstrate reduce muscle spasm / tightness in the mid/ low back to reduce pain to </= 8/10 pain and promote trunk mobility (08/09/2016)   Time 3   Period Weeks   Status New           PT Long Term Goals - 07/19/16 1030      PT LONG TERM GOAL #1   Title she will be I with all HEP given as of last visit (09/13/2016)   Time 6   Period Weeks   Status New     PT LONG TERM GOAL #2   Title pt will increase trunk mobility by >/= 10 degrees in all planes with </= 5/10 pain for mobility required for ADLS ([id number])   Time 6   Period Weeks   Status New     PT LONG TERM GOAL #3   Title she will increase bil LE strength to >/= 4/5 for prolonged standing/ walking and maintaining safety (09/13/2016)   Time 6   Period  Weeks   Status New     PT LONG TERM GOAL #4   Title she will be able to sit/ stand/ walk for >/= 20 min with </= 5/10 pain for functional endurance required for ADLS and community mobility (09/13/2016)   Time 6   Period Weeks   Status New     PT LONG TERM GOAL #5   Title she will increase her FOTO score to </= 58% limited to demonstate improvement in function (09/13/2016)   Time 6   Period Weeks   Status New               Plan - 07/26/16 1225    Clinical Impression Statement Continued core and hip strengthening. Added hip flexor stretch for HEP. Pt c/o heel pain with bridges, did well using physioball with bridges.    PT Next Visit Plan assess/ reivew HEP, manual, core strenghtening, hip strengthening, trunk mobiltiy/ mobs. (hx of CX no heat/stim until oncologist has signed off)-ADD Needles lower trunk rotation,  sidelying clam shells, supine marching, glut set   Consulted and Agree with Plan of Care Patient      Patient will benefit from skilled therapeutic intervention in order to improve the following deficits and impairments:  Abnormal gait, Pain, Increased fascial restricitons, Difficulty walking, Decreased range of motion, Decreased strength, Decreased mobility, Decreased endurance, Decreased activity tolerance, Postural dysfunction, Improper body mechanics, Hypomobility  Visit Diagnosis: Chronic bilateral low back pain, with sciatica presence unspecified  Muscle weakness (generalized)  Abnormal posture  Muscle spasm of back  Other abnormalities of gait and mobility     Problem List Patient Active Problem List   Diagnosis Date Noted  . Neuropathic pain 06/25/2016  . 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 S/P lumbar spinal fusion 01/27/2016  . H/O therapeutic radiation 09/21/2015  . Pes planus of both feet 08/04/2015  . Primary osteoarthritis of right knee 03/10/2015  . Osteoarthritis of right knee 02/04/2015  .  Primary osteoarthritis of left knee 09/13/2014  . Hot flashes 01/19/2014  . Abdominal pain, unspecified site 01/19/2014  . Malignant neoplasm of upper-outer quadrant of left breast in female, estrogen receptor positive ([place]) 03/19/2013  . Lumbosacral spondylosis without myelopathy 12/30/2012  . Fibromyalgia syndrome 08/15/2012  . Peripheral neuropathy, toxic 08/15/2012  . Lymphedema of arm - left 04/30/2012    Dorene Ar 07/26/2016, 12:42 PM  [facility] [address] [place], [place], [address] Phone: [phone]   Fax:  [phone]  Name: Kelsey Wilson MRN: [id number] Date of Birth: May 17, 1961

## 2016-07-27 DIAGNOSIS — H93293 Other abnormal auditory perceptions, bilateral: Secondary | ICD-10-CM | POA: Insufficient documentation

## 2016-07-27 NOTE — Progress Notes (Signed)
This SLP performed pt's outpatient Modified Barium swallow on 07/24/16. SLP reviewed video and noticed a possibly anomaly/abnormality or anatomical difference. Flow of barium diverts in an abnormal location (supraglottic close to pyriform sinuses). Tissue appears thick on video fluroscopy. Uncertain if there is an abnormal structure at that site versus anatomical difference. Noted pt has appointment with Dr. Redmond Baseman today, ENT. MBS video is in imaging (see frame # [phone])    Kelsey Wilson [place] M.Ed [employer] [phone]

## 2016-07-27 NOTE — Progress Notes (Signed)
Correction to SLP's entry from 2/23: Order section reviewed and do not see that pt has ENT appointment scheduled for today.     Orbie Pyo [place] [phone]

## 2016-07-30 ENCOUNTER — Ambulatory Visit: Payer: Medicare Other | Admitting: Physical Therapy

## 2016-07-30 DIAGNOSIS — M545 Low back pain: Secondary | ICD-10-CM | POA: Diagnosis not present

## 2016-07-30 DIAGNOSIS — G8929 Other chronic pain: Secondary | ICD-10-CM

## 2016-07-30 DIAGNOSIS — R2689 Other abnormalities of gait and mobility: Secondary | ICD-10-CM

## 2016-07-30 DIAGNOSIS — M62830 Muscle spasm of back: Secondary | ICD-10-CM

## 2016-07-30 DIAGNOSIS — R293 Abnormal posture: Secondary | ICD-10-CM

## 2016-07-30 DIAGNOSIS — M6281 Muscle weakness (generalized): Secondary | ICD-10-CM

## 2016-07-30 NOTE — Therapy (Signed)
[place], [place], [address] Phone: [phone]   Fax:  [phone]  Physical Therapy Treatment  Patient Details  Name: Kelsey Wilson MRN: [id number] Date of Birth: Nov 02, 1960 Referring Provider: Gomez Cleverly MD  Encounter Date: 07/30/2016      PT End of Session - 07/30/16 1221    Visit Number 4   Number of Visits 13   Date for PT Re-Evaluation 09/13/16   Authorization Type Medicare: Kx mod by 15th visit, progress note by 10th visits   PT Start Time 1145   PT Stop Time 1230   PT Time Calculation (min) 45 min      Past Medical History:  Diagnosis Date  . Anemia   . Anxiety   . Arthritis   . Breast cancer ([place]) 2010   T3N1 invasive ductal carcinoma left breast.Takes Arimidex daily  . Bursitis   . Carpal tunnel syndrome   . Chronic back pain    stenosis  . Constipation    takes Colace daily  . Depression    takes Benzotropine daily  . Diverticulitis of colon   . Fibromyalgia 08/2012  . GERD (gastroesophageal reflux disease)    takes Dexilant daily  . Hemorrhoid   . History of blood transfusion    no abnormal reaction noted  . History of colon polyps    benign  . History of shingles   . Joint pain   . Joint swelling   . Night muscle spasms    takes Flexeril nightly as needed  . Nocturia   . Peripheral edema    takes Furosemide.Just started 01/18/16  . Peripheral neuropathy (HCC)    takes Lyrica daily  . Pneumonia    hx of-2015  . Seasonal allergies    takes Singulair nightly  . SOB (shortness of breath) on exertion    rarely with exertion  . 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 Alphonzo Grieve)    Past Surgical History:  Procedure Laterality Date  . ABDOMINAL HYSTERECTOMY     still has ovaries  . APPENDECTOMY    . AXILLARY LYMPH NODE DISSECTION  11/28/2011    Procedure: AXILLARY LYMPH NODE DISSECTION;  Surgeon: Edward Jolly, MD;  Location: [facility];  Service: General;  Laterality: Left;  . BREAST SURGERY Left 2013  . CARPAL TUNNEL RELEASE     Bilateral  . CHOLECYSTECTOMY    . COLONOSCOPY    . EYE SURGERY Left    cataract removal  . KNEE SURGERY     Left Knee  . LUMBAR LAMINECTOMY/DECOMPRESSION MICRODISCECTOMY Bilateral 01/27/2016   Procedure: Laminectomy and Foraminotomy - Lumbar four -Lumbar five - bilateral- on-lay noninstrumented fusion;  Surgeon: Eustace Moore, MD;  Location: [facility];  Service: Neurosurgery;  Laterality: Bilateral;  . MULTIPLE EXTRACTIONS WITH ALVEOLOPLASTY N/A 05/11/2016   Procedure: EXTRACTION OF TEETH EIGHTEEN, TWENTY AND TWENTY- NINE;  REMOVAL OF MANDIBULAR TORUS AND EXOSTOSIS;  Surgeon: Diona Browner, DDS;  Location: [facility];  Service: Oral Surgery;  Laterality: N/A;  . PORTACATH PLACEMENT  06/18/2011   Procedure: INSERTION PORT-A-CATH;  Surgeon: Edward Jolly, MD;  Location: [facility];  Service: General;  Laterality: Right;  right subclavian  . removal portacath  2014  . spur     Apex spur on both big toes  . TOE SURGERY Bilateral   . TONSILLECTOMY    .  TOTAL KNEE ARTHROPLASTY Left 09/13/2014  . TOTAL KNEE ARTHROPLASTY Left 09/13/2014   Procedure: LEFT TOTAL KNEE ARTHROPLASTY;  Surgeon: Rod Can, MD;  Location: [facility];  Service: Orthopedics;  Laterality: Left;  . TOTAL KNEE ARTHROPLASTY Right 03/10/2015   Procedure: RIGHT TOTAL KNEE ARTHROPLASTY;  Surgeon: Rod Can, MD;  Location: [facility];  Service: Orthopedics;  Laterality: Right;    There were no vitals filed for this visit.      Subjective Assessment - 07/30/16 1208    Subjective My right leg feels weak. Its still numb from the knee replacement.    Currently in Pain? Yes   Pain Score 8    Pain Location Back   Pain Orientation Mid;Lower   Pain Descriptors / Indicators Aching;Sore   Pain Type Chronic pain                          [facility] Adult PT Treatment/Exercise - 07/30/16 0001      Lumbar Exercises: Stretches   Passive Hamstring Stretch 1 rep;60 seconds   Passive Hamstring Stretch Limitations strap      Lumbar Exercises: Aerobic   Stationary Bike Nustep L 4 UE/LE x 5 minutes      Lumbar Exercises: Supine   Clam 20 reps   Clam Limitations red band   Straight Leg Raise 10 reps   Straight Leg Raises Limitations with ab set   Other Supine Lumbar Exercises ball squeeze x 10      Lumbar Exercises: Sidelying   Clam 20 reps   Clam Limitations with abdominal draw in   Other Sidelying Lumbar Exercises reverse clam x10      Knee/Hip Exercises: Stretches   Hip Flexor Stretch 1 rep;60 seconds                PT Education - 07/30/16 1228    Education provided Yes   Education Details Hip flexor stretch    Person(s) Educated Patient   Methods Explanation;Handout   Comprehension Verbalized understanding          PT Short Term Goals - 07/30/16 1224      PT SHORT TERM GOAL #1   Title pt will be I with inital HEP (08/09/2016)   Time 3   Period Weeks   Status Achieved     PT SHORT TERM GOAL #2   Title she will be able to verbalize and demo proper posture, walking mechanics and bending/ lifting mechanics to prevent and reduce low back pain (08/09/2016)   Time 3   Status On-going     PT SHORT TERM GOAL #3   Title pt will demonstrate reduce muscle spasm / tightness in the mid/ low back to reduce pain to </= 8/10 pain and promote trunk mobility (08/09/2016)   Baseline 8/10   Time 3   Period Weeks   Status Partially Met           PT Long Term Goals - 07/19/16 1030      PT LONG TERM GOAL #1   Title she will be I with all HEP given as of last visit (09/13/2016)   Time 6   Period Weeks   Status New     PT LONG TERM GOAL #2   Title pt will increase trunk mobility by >/= 10 degrees in all planes with </= 5/10 pain for mobility required for ADLS (07/16/9415)    Time 6   Period Weeks   Status New  PT LONG TERM GOAL #3   Title she will increase bil LE strength to >/= 4/5 for prolonged standing/ walking and maintaining safety (09/13/2016)   Time 6   Period Weeks   Status New     PT LONG TERM GOAL #4   Title she will be able to sit/ stand/ walk for >/= 20 min with </= 5/10 pain for functional endurance required for ADLS and community mobility (09/13/2016)   Time 6   Period Weeks   Status New     PT LONG TERM GOAL #5   Title she will increase her FOTO score to </= 58% limited to demonstate improvement in function (09/13/2016)   Time 6   Period Weeks   Status New               Plan - 07/30/16 1221    Clinical Impression Statement Pt is having difficulty with hamstring cramps. She had apppointments this morning and thinks she did not drink enough water. Care taken to avoid hamstring cramps during session today. Her overall back pain is unchanged. She reports compliance with HEP. Her pain average is 8/10.  Added hip flexor stretch today. STG#1 met, STG# 3 partially Met.    PT Next Visit Plan assess/ reivew HEP, manual, core strenghtening, hip strengthening, trunk mobiltiy/ mobs. (hx of CX no heat/stim until oncologist has signed off)   PT Home Exercise Plan lower trunk rotation,  sidelying clam shells, supine marching, glut set, hip flexor stretch   Consulted and Agree with Plan of Care Patient      Patient will benefit from skilled therapeutic intervention in order to improve the following deficits and impairments:  Abnormal gait, Pain, Increased fascial restricitons, Difficulty walking, Decreased range of motion, Decreased strength, Decreased mobility, Decreased endurance, Decreased activity tolerance, Postural dysfunction, Improper body mechanics, Hypomobility  Visit Diagnosis: Chronic bilateral low back pain, with sciatica presence unspecified  Muscle weakness (generalized)  Abnormal posture  Muscle spasm of back  Other  abnormalities of gait and mobility     Problem List Patient Active Problem List   Diagnosis Date Noted  . Neuropathic pain 06/25/2016  . 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 S/P lumbar spinal fusion 01/27/2016  . H/O therapeutic radiation 09/21/2015  . Pes planus of both feet 08/04/2015  . Primary osteoarthritis of right knee 03/10/2015  . Osteoarthritis of right knee 02/04/2015  . Primary osteoarthritis of left knee 09/13/2014  . Hot flashes 01/19/2014  . Abdominal pain, unspecified site 01/19/2014  . Malignant neoplasm of upper-outer quadrant of left breast in female, estrogen receptor positive ([facility]) 03/19/2013  . Lumbosacral spondylosis without myelopathy 12/30/2012  . Fibromyalgia syndrome 08/15/2012  . Peripheral neuropathy, toxic 08/15/2012  . Lymphedema of arm - left 04/30/2012    Dorene Ar, PTA 07/30/2016, 12:31 PM  [facility] [address] [place], [place], [address] Phone: [phone]   Fax:  [phone]  Name: Kelsey Wilson MRN: [id number] Date of Birth: [birth year]

## 2016-07-30 NOTE — Patient Instructions (Signed)
Hip Flexor Stretch    Lying on back near edge of bed, bend one leg, foot flat. Hang other leg over edge, relaxed, thigh resting entirely on bed for __1__ minutes. Repeat __1__ times. Do _2___ sessions per day. Advanced Exercise: Bend knee back keeping thigh in contact with bed.   

## 2016-08-01 ENCOUNTER — Ambulatory Visit: Payer: Medicare Other | Admitting: Physical Therapy

## 2016-08-01 DIAGNOSIS — M62830 Muscle spasm of back: Secondary | ICD-10-CM

## 2016-08-01 DIAGNOSIS — M6281 Muscle weakness (generalized): Secondary | ICD-10-CM

## 2016-08-01 DIAGNOSIS — M545 Low back pain: Secondary | ICD-10-CM | POA: Diagnosis not present

## 2016-08-01 DIAGNOSIS — G8929 Other chronic pain: Secondary | ICD-10-CM

## 2016-08-01 DIAGNOSIS — R2689 Other abnormalities of gait and mobility: Secondary | ICD-10-CM

## 2016-08-01 DIAGNOSIS — R293 Abnormal posture: Secondary | ICD-10-CM

## 2016-08-01 NOTE — Therapy (Signed)
[place], [place], [address] Phone: [phone]   Fax:  [phone]  Physical Therapy Treatment  Patient Details  Name: Kelsey Wilson MRN: [id number] Date of Birth: [birth year] Referring Provider: Gomez Cleverly MD  Encounter Date: 08/01/2016      PT End of Session - 08/01/16 1156    Visit Number 5   Number of Visits 13   Date for PT Re-Evaluation 09/13/16   Authorization Type Medicare: Kx mod by 15th visit, progress note by 10th visits   PT Start Time 1143   PT Stop Time 1225   PT Time Calculation (min) 42 min      Past Medical History:  Diagnosis Date  . Anemia   . Anxiety   . Arthritis   . Breast cancer ([facility]) 2010   T3N1 invasive ductal carcinoma left breast.Takes Arimidex daily  . Bursitis   . Carpal tunnel syndrome   . Chronic back pain    stenosis  . Constipation    takes Colace daily  . Depression    takes Benzotropine daily  . Diverticulitis of colon   . Fibromyalgia 08/2012  . GERD (gastroesophageal reflux disease)    takes Dexilant daily  . Hemorrhoid   . History of blood transfusion    no abnormal reaction noted  . History of colon polyps    benign  . History of shingles   . Joint pain   . Joint swelling   . Night muscle spasms    takes Flexeril nightly as needed  . Nocturia   . Peripheral edema    takes Furosemide.Just started 01/18/16  . Peripheral neuropathy (HCC)    takes Lyrica daily  . Pneumonia    hx of-2015  . Seasonal allergies    takes Singulair nightly  . SOB (shortness of breath) on exertion    rarely with exertion  . 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 Alphonzo Grieve)    Past Surgical History:  Procedure Laterality Date  . ABDOMINAL HYSTERECTOMY     still has ovaries  . APPENDECTOMY    . AXILLARY LYMPH NODE DISSECTION  11/28/2011    Procedure: AXILLARY LYMPH NODE DISSECTION;  Surgeon: Edward Jolly, MD;  Location: [facility];  Service: General;  Laterality: Left;  . BREAST SURGERY Left 2013  . CARPAL TUNNEL RELEASE     Bilateral  . CHOLECYSTECTOMY    . COLONOSCOPY    . EYE SURGERY Left    cataract removal  . KNEE SURGERY     Left Knee  . LUMBAR LAMINECTOMY/DECOMPRESSION MICRODISCECTOMY Bilateral 01/27/2016   Procedure: Laminectomy and Foraminotomy - Lumbar four -Lumbar five - bilateral- on-lay noninstrumented fusion;  Surgeon: Eustace Moore, MD;  Location: [facility];  Service: Neurosurgery;  Laterality: Bilateral;  . MULTIPLE EXTRACTIONS WITH ALVEOLOPLASTY N/A 05/11/2016   Procedure: EXTRACTION OF TEETH EIGHTEEN, TWENTY AND TWENTY- NINE;  REMOVAL OF MANDIBULAR TORUS AND EXOSTOSIS;  Surgeon: Diona Browner, DDS;  Location: [facility];  Service: Oral Surgery;  Laterality: N/A;  . PORTACATH PLACEMENT  06/18/2011   Procedure: INSERTION PORT-A-CATH;  Surgeon: Edward Jolly, MD;  Location: [facility];  Service: General;  Laterality: Right;  right subclavian  . removal portacath  2014  . spur     Apex spur on both big toes  . TOE SURGERY Bilateral   . TONSILLECTOMY    .  TOTAL KNEE ARTHROPLASTY Left 09/13/2014  . TOTAL KNEE ARTHROPLASTY Left 09/13/2014   Procedure: LEFT TOTAL KNEE ARTHROPLASTY;  Surgeon: Rod Can, MD;  Location: [facility];  Service: Orthopedics;  Laterality: Left;  . TOTAL KNEE ARTHROPLASTY Right 03/10/2015   Procedure: RIGHT TOTAL KNEE ARTHROPLASTY;  Surgeon: Rod Can, MD;  Location[facility];  Service: Orthopedics;  Laterality: Right;    There were no vitals filed for this visit.      Subjective Assessment - 08/01/16 1147    Subjective I am still an 8/10. Maybe a 6/10 at the least. I cannot even sit comfortably. I enjoy coming.    Currently in Pain? Yes   Pain Score 8   i want to rate it higher but I dont need to go to the ER    Pain Location Back   Pain Orientation Mid;Lower    Pain Descriptors / Indicators Aching;Sore   Aggravating Factors  transitions, bending, twisting, lifting, sitting    Pain Relieving Factors moving around, change positions                         [facility] Adult PT Treatment/Exercise - 08/01/16 0001      Lumbar Exercises: Stretches   Pelvic Tilt 5 reps   Pelvic Tilt Limitations disc. due to continued cramping     Lumbar Exercises: Aerobic   Stationary Bike Nustep L 4 UE/LE x 6 minutes      Lumbar Exercises: Seated   Other Seated Lumbar Exercises dyna disc posterior pelvic tilits and hold, practice breathing. 2 inch step under feet , added marching, tolerated well     Lumbar Exercises: Supine   Ab Set 5 reps   AB Set Limitations focusing on breathing while maintaining core contraction    Clam 20 reps   Clam Limitations green   Bent Knee Raise 20 reps   Bent Knee Raise Limitations with ab draw in    Other Supine Lumbar Exercises ball squeeze x 20      Lumbar Exercises: Sidelying   Clam 20 reps   Clam Limitations with abdominal draw in  right only today due to vertigo                  PT Short Term Goals - 08/01/16 1252      PT SHORT TERM GOAL #1   Title pt will be I with inital HEP (08/09/2016)   Time 3   Period Weeks   Status Achieved     PT SHORT TERM GOAL #2   Title she will be able to verbalize and demo proper posture, walking mechanics and bending/ lifting mechanics to prevent and reduce low back pain (08/09/2016)   Time 3   Period Weeks   Status On-going     PT SHORT TERM GOAL #3   Title pt will demonstrate reduce muscle spasm / tightness in the mid/ low back to reduce pain to </= 8/10 pain and promote trunk mobility (08/09/2016)   Baseline 8/10 consistently, 6/10 least    Time 3   Period Weeks   Status Achieved           PT Long Term Goals - 07/19/16 1030      PT LONG TERM GOAL #1   Title she will be I with all HEP given as of last visit (09/13/2016)   Time 6   Period Weeks    Status New     PT LONG TERM GOAL #2  Title pt will increase trunk mobility by >/= 10 degrees in all planes with </= 5/10 pain for mobility required for ADLS (07/15/1550)   Time 6   Period Weeks   Status New     PT LONG TERM GOAL #3   Title she will increase bil LE strength to >/= 4/5 for prolonged standing/ walking and maintaining safety (09/13/2016)   Time 6   Period Weeks   Status New     PT LONG TERM GOAL #4   Title she will be able to sit/ stand/ walk for >/= 20 min with </= 5/10 pain for functional endurance required for ADLS and community mobility (09/13/2016)   Time 6   Period Weeks   Status New     PT LONG TERM GOAL #5   Title she will increase her FOTO score to </= 58% limited to demonstate improvement in function (09/13/2016)   Time 6   Period Weeks   Status New               Plan - 08/01/16 1156    Clinical Impression Statement Vertigo today and since her MD appt for hearing test. She is going back for follow up soon. Unable to lie sidelying today due to vertigo. She is still limited by leg cramps during therex. Pt reports pain still 8/10. Discussed pain scale. She reports decreased pain intensity from 10/10 to 8/10 and never less than 6/10. She reports a little improvement in ability to stand up straight. STG#3 Met.    PT Next Visit Plan assess/ reivew HEP, manual, core strenghtening, hip strengthening, trunk mobiltiy/ mobs. (hx of CX no heat/stim until oncologist has signed off), try sitting on dyna disc? standing bands?   PT Home Exercise Plan lower trunk rotation,  sidelying clam shells, supine marching, glut set, hip flexor stretch   Consulted and Agree with Plan of Care Patient      Patient will benefit from skilled therapeutic intervention in order to improve the following deficits and impairments:  Abnormal gait, Pain, Increased fascial restricitons, Difficulty walking, Decreased range of motion, Decreased strength, Decreased mobility, Decreased endurance,  Decreased activity tolerance, Postural dysfunction, Improper body mechanics, Hypomobility  Visit Diagnosis: Chronic bilateral low back pain, with sciatica presence unspecified  Muscle weakness (generalized)  Abnormal posture  Muscle spasm of back  Other abnormalities of gait and mobility     Problem List Patient Active Problem List   Diagnosis Date Noted  . Neuropathic pain 06/25/2016  . 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 S/P lumbar spinal fusion 01/27/2016  . H/O therapeutic radiation 09/21/2015  . Pes planus of both feet 08/04/2015  . Primary osteoarthritis of right knee 03/10/2015  . Osteoarthritis of right knee 02/04/2015  . Primary osteoarthritis of left knee 09/13/2014  . Hot flashes 01/19/2014  . Abdominal pain, unspecified site 01/19/2014  . Malignant neoplasm of upper-outer quadrant of left breast in female, estrogen receptor positive ([place]) 03/19/2013  . Lumbosacral spondylosis without myelopathy 12/30/2012  . Fibromyalgia syndrome 08/15/2012  . Peripheral neuropathy, toxic 08/15/2012  . Lymphedema of arm - left 04/30/2012    Dorene Ar, PTA 08/01/2016, 12:55 PM  [facility] [address] [place], [place], [address] Phone: [phone]   Fax:  [phone]  Name: Kelsey Wilson MRN: [id number] Date of Birth: Mar 06, 1961

## 2016-08-04 DIAGNOSIS — G4733 Obstructive sleep apnea (adult) (pediatric): Secondary | ICD-10-CM

## 2016-08-04 NOTE — Procedures (Signed)
   Patient Name: Kelsey Wilson, Kelsey Wilson Date: 07/23/2016 Gender: Female D.O.B: 1961-01-22 Age (years): 55 Referring Provider: Lillia Corporal Height (inches): 63 Interpreting Physician: Baird Lyons MD, ABSM Weight (lbs): 265 RPSGT: Jonna Coup BMI: 47 MRN: [id number] Neck Size: 14.50 CLINICAL INFORMATION Sleep Study Type: NPSG  Indication for sleep study: Excessive Daytime Sleepiness, Fatigue, Obesity, Snoring  Epworth Sleepiness Score: 23  SLEEP STUDY TECHNIQUE As per the AASM Manual for the Scoring of Sleep and Associated Events v2.3 (April 2016) with a hypopnea requiring 4% desaturations.  The channels recorded and monitored were frontal, central and occipital EEG, electrooculogram (EOG), submentalis EMG (chin), nasal and oral airflow, thoracic and abdominal wall motion, anterior tibialis EMG, snore microphone, electrocardiogram, and pulse oximetry.  MEDICATIONS Medications self-administered by patient taken the night of the study : tramadol, benzonatate, Lyrica, tizanidine  SLEEP ARCHITECTURE The study was initiated at 10:37:43 PM and ended at 4:43:27 AM.  Sleep onset time was 6.2 minutes and the sleep efficiency was 83.9%. The total sleep time was 307.0 minutes.  Stage REM latency was 348.5 minutes.  The patient spent 2.77% of the night in stage N1 sleep, 60.75% in stage N2 sleep, 33.22% in stage N3 and 3.26% in REM.  Alpha intrusion was absent.  Supine sleep was 81.40%.  RESPIRATORY PARAMETERS The overall apnea/hypopnea index (AHI) was 16.8 per hour. There were 7 total apneas, including 4 obstructive, 3 central and 0 mixed apneas. There were 79 hypopneas and 2 RERAs.  The AHI during Stage REM sleep was 114.0 per hour.  AHI while supine was 19.7 per hour.  The mean oxygen saturation was 91.67%. The minimum SpO2 during sleep was 72.00%.  Moderate snoring was noted during this study.  CARDIAC DATA The 2 lead EKG demonstrated sinus rhythm. The mean  heart rate was 93.97 beats per minute. Other EKG findings include: None.  LEG MOVEMENT DATA The total PLMS were 23 with a resulting PLMS index of 4.50. Associated arousal with leg movement index was 2.5 .  IMPRESSIONS - Moderate obstructive sleep apnea occurred during this study (AHI = 16.8/h). - Insufficient early events to meet protocol requirements for split CPAP titration. - No significant central sleep apnea occurred during this study (CAI = 0.6/h). - Moderate oxygen desaturation was noted during this study (Min O2 = 72.00%). - The patient snored with Moderate snoring volume. - Sleep- talking noted by tech - No cardiac abnormalities were noted during this study. - Clinically significant periodic limb movements did not occur during sleep. No significant associated arousals.  DIAGNOSIS - Obstructive Sleep Apnea (327.23 [G47.33 ICD-10]) - Nocturnal Hypoxemia (327.26 [G47.36 ICD-10])  RECOMMENDATIONS - Therapeutic CPAP titration to determine optimal pressure required to alleviate sleep disordered breathing. - Positional therapy avoiding supine position during sleep. - Avoid alcohol, sedatives and other CNS depressants that may worsen sleep apnea and disrupt normal sleep architecture. - Sleep hygiene should be reviewed to assess factors that may improve sleep quality. - Weight management and regular exercise should be initiated or continued if appropriate.  [Electronically signed] 08/04/2016 11:03 AM  Baird Lyons MD, ABSM Diplomate, American Board of Sleep Medicine   NPI: [id number]  NPI: [id number]  [place], American Board of Sleep Medicine  ELECTRONICALLY SIGNED ON:  08/04/2016, 10:58 AM [place] PH: [phone]   FX: [phone] [place]

## 2016-08-06 ENCOUNTER — Ambulatory Visit: Payer: Medicare Other | Attending: Specialist | Admitting: Physical Therapy

## 2016-08-06 DIAGNOSIS — R2689 Other abnormalities of gait and mobility: Secondary | ICD-10-CM | POA: Insufficient documentation

## 2016-08-06 DIAGNOSIS — R293 Abnormal posture: Secondary | ICD-10-CM | POA: Diagnosis present

## 2016-08-06 DIAGNOSIS — M62830 Muscle spasm of back: Secondary | ICD-10-CM | POA: Diagnosis present

## 2016-08-06 DIAGNOSIS — M6281 Muscle weakness (generalized): Secondary | ICD-10-CM | POA: Insufficient documentation

## 2016-08-06 DIAGNOSIS — G8929 Other chronic pain: Secondary | ICD-10-CM | POA: Insufficient documentation

## 2016-08-06 DIAGNOSIS — M545 Low back pain: Secondary | ICD-10-CM | POA: Insufficient documentation

## 2016-08-06 MED FILL — ANASTROZOLE 1 MG TABLET: 1 | 30 days supply | Qty: 30 | Fill #1

## 2016-08-06 NOTE — Therapy (Signed)
[place], [place], [address] Phone: [phone]   Fax:  [phone]  Physical Therapy Treatment  Patient Details  Name: Kelsey Wilson MRN: [id number] Date of Birth: 1961-01-11 Referring Provider: Gomez Cleverly MD  Encounter Date: 08/06/2016      PT End of Session - 08/06/16 1417    Visit Number 6   Number of Visits 13   Date for PT Re-Evaluation 09/13/16   Authorization Type Medicare: Kx mod by 15th visit, progress note by 10th visits   PT Start Time 1330   PT Stop Time 1416   PT Time Calculation (min) 46 min   Activity Tolerance Patient tolerated treatment well   Behavior During Therapy [facility] for tasks assessed/performed      Past Medical History:  Diagnosis Date  . Anemia   . Anxiety   . Arthritis   . Breast cancer ([place]) 2010   T3N1 invasive ductal carcinoma left breast.Takes Arimidex daily  . Bursitis   . Carpal tunnel syndrome   . Chronic back pain    stenosis  . Constipation    takes Colace daily  . Depression    takes Benzotropine daily  . Diverticulitis of colon   . Fibromyalgia 08/2012  . GERD (gastroesophageal reflux disease)    takes Dexilant daily  . Hemorrhoid   . History of blood transfusion    no abnormal reaction noted  . History of colon polyps    benign  . History of shingles   . Joint pain   . Joint swelling   . Night muscle spasms    takes Flexeril nightly as needed  . Nocturia   . Peripheral edema    takes Furosemide.Just started 01/18/16  . Peripheral neuropathy (HCC)    takes Lyrica daily  . Pneumonia    hx of-2015  . Seasonal allergies    takes Singulair nightly  . SOB (shortness of breath) on exertion    rarely with exertion  . 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 Alphonzo Grieve)    Past Surgical History:  Procedure Laterality Date  .  ABDOMINAL HYSTERECTOMY     still has ovaries  . APPENDECTOMY    . AXILLARY LYMPH NODE DISSECTION  11/28/2011   Procedure: AXILLARY LYMPH NODE DISSECTION;  Surgeon: Edward Jolly, MD;  Location: [place];  Service: General;  Laterality: Left;  . BREAST SURGERY Left 2013  . CARPAL TUNNEL RELEASE     Bilateral  . CHOLECYSTECTOMY    . COLONOSCOPY    . EYE SURGERY Left    cataract removal  . KNEE SURGERY     Left Knee  . LUMBAR LAMINECTOMY/DECOMPRESSION MICRODISCECTOMY Bilateral 01/27/2016   Procedure: Laminectomy and Foraminotomy - Lumbar four -Lumbar five - bilateral- on-lay noninstrumented fusion;  Surgeon: Eustace Moore, MD;  Location: [place] [facility];  Service: Neurosurgery;  Laterality: Bilateral;  . MULTIPLE EXTRACTIONS WITH ALVEOLOPLASTY N/A 05/11/2016   Procedure: EXTRACTION OF TEETH EIGHTEEN, TWENTY AND TWENTY- NINE;  REMOVAL OF MANDIBULAR TORUS AND EXOSTOSIS;  Surgeon: Diona Browner, DDS;  Location: [place];  Service: Oral Surgery;  Laterality: N/A;  . PORTACATH PLACEMENT  06/18/2011   Procedure: INSERTION PORT-A-CATH;  Surgeon: Edward Jolly, MD;  Location: [place];  Service: General;  Laterality: Right;  right subclavian  . removal portacath  2014  . spur  Apex spur on both big toes  . TOE SURGERY Bilateral   . TONSILLECTOMY    . TOTAL KNEE ARTHROPLASTY Left 09/13/2014  . TOTAL KNEE ARTHROPLASTY Left 09/13/2014   Procedure: LEFT TOTAL KNEE ARTHROPLASTY;  Surgeon: Rod Can, MD;  Location: [place];  Service: Orthopedics;  Laterality: Left;  . TOTAL KNEE ARTHROPLASTY Right 03/10/2015   Procedure: RIGHT TOTAL KNEE ARTHROPLASTY;  Surgeon: Rod Can, MD;  [facility];  Service: Orthopedics;  Laterality: Right;    There were no vitals filed for this visit.      Subjective Assessment - 08/06/16 1333    Subjective "its going as much as possible today, I think I am slowly working toward those goals"    Currently in Pain? Yes   Pain Score 7    Pain  Location Back   Pain Orientation Mid;Lower   Pain Descriptors / Indicators Sharp;Aching   Pain Type Chronic pain   Pain Onset More than a month ago   Pain Frequency Constant   Aggravating Factors  any movement, trying to walk long distance   Pain Relieving Factors pain medication, laying down,                          [facility] Adult PT Treatment/Exercise - 08/06/16 1335      Lumbar Exercises: Stretches   Passive Hamstring Stretch 2 reps;30 seconds   Single Knee to Chest Stretch 2 reps;30 seconds   Lower Trunk Rotation 3 reps;60 seconds     Lumbar Exercises: Aerobic   Stationary Bike Nustep L 4 UE/LE x 6 minutes      Lumbar Exercises: Seated   Other Seated Lumbar Exercises dyna disc posterior pelvic tilits and hold, practice breathing. 2 inch step under feet , added marching     Lumbar Exercises: Supine   Clam 20 reps   Clam Limitations green   Bent Knee Raise 20 reps   Bent Knee Raise Limitations with ab draw in      Manual Therapy   Manual Therapy Joint mobilization;Myofascial release   Manual therapy comments manual trigger point release x 2 in bil QL   Joint Mobilization L1-L5 grade 2 PA   Myofascial Release DTM over bil lumbar paraspinals using tennis ball                PT Education - 08/06/16 1417    Education provided Yes   Education Details beneifts of manual trigger point release   Person(s) Educated Patient   Methods Explanation;Verbal cues   Comprehension Verbal cues required;Verbalized understanding          PT Short Term Goals - 08/01/16 1252      PT SHORT TERM GOAL #1   Title pt will be I with inital HEP (08/09/2016)   Time 3   Period Weeks   Status Achieved     PT SHORT TERM GOAL #2   Title she will be able to verbalize and demo proper posture, walking mechanics and bending/ lifting mechanics to prevent and reduce low back pain (08/09/2016)   Time 3   Period Weeks   Status On-going     PT SHORT TERM GOAL #3   Title pt  will demonstrate reduce muscle spasm / tightness in the mid/ low back to reduce pain to </= 8/10 pain and promote trunk mobility (08/09/2016)   Baseline 8/10 consistently, 6/10 least    Time 3   Period Weeks   Status Achieved  PT Long Term Goals - 07/19/16 1030      PT LONG TERM GOAL #1   Title she will be I with all HEP given as of last visit (09/13/2016)   Time 6   Period Weeks   Status New     PT LONG TERM GOAL #2   Title pt will increase trunk mobility by >/= 10 degrees in all planes with </= 5/10 pain for mobility required for ADLS ([id number])   Time 6   Period Weeks   Status New     PT LONG TERM GOAL #3   Title she will increase bil LE strength to >/= 4/5 for prolonged standing/ walking and maintaining safety (09/13/2016)   Time 6   Period Weeks   Status New     PT LONG TERM GOAL #4   Title she will be able to sit/ stand/ walk for >/= 20 min with </= 5/10 pain for functional endurance required for ADLS and community mobility (09/13/2016)   Time 6   Period Weeks   Status New     PT LONG TERM GOAL #5   Title she will increase her FOTO score to </= 58% limited to demonstate improvement in function (09/13/2016)   Time 6   Period Weeks   Status New               Plan - 08/06/16 1417    Clinical Impression Statement Conitnued work on core/ hip strengthening which she reported soreness in other joints beside her back. worked on manual to calm down tightness in the L QL and grade 2 PA lumbar mobs to increase mobility. If pt makes no progress in next 2 visits plan to refer back to the MD for further assessment.    PT Next Visit Plan update HEP PRN,  manual, core strenghtening, hip strengthening, trunk mobiltiy/ mobs. (hx of CX no heat/stim until oncologist has signed off) manual, standing bands?   PT Home Exercise Plan lower trunk rotation,  sidelying clam shells, supine marching, glut set, hip flexor stretch   Consulted and Agree with Plan of Care Patient       Patient will benefit from skilled therapeutic intervention in order to improve the following deficits and impairments:     Visit Diagnosis: Chronic bilateral low back pain, with sciatica presence unspecified  Abnormal posture  Muscle weakness (generalized)  Muscle spasm of back  Other abnormalities of gait and mobility     Problem List Patient Active Problem List   Diagnosis Date Noted  . Neuropathic pain 06/25/2016  . 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 S/P lumbar spinal fusion 01/27/2016  . H/O therapeutic radiation 09/21/2015  . Pes planus of both feet 08/04/2015  . Primary osteoarthritis of right knee 03/10/2015  . Osteoarthritis of right knee 02/04/2015  . Primary osteoarthritis of left knee 09/13/2014  . Hot flashes 01/19/2014  . Abdominal pain, unspecified site 01/19/2014  . Malignant neoplasm of upper-outer quadrant of left breast in female, estrogen receptor positive ([place]) 03/19/2013  . Lumbosacral spondylosis without myelopathy 12/30/2012  . Fibromyalgia syndrome 08/15/2012  . Peripheral neuropathy, toxic 08/15/2012  . Lymphedema of arm - left 04/30/2012   Starr Lake PT, DPT, LAT, ATC  08/06/16  2:20 PM      [facility] [address] [place], [place], [address] Phone: [phone]   Fax:  [phone]  Name: Kelsey Wilson MRN: [id number] Date of Birth: 11-09-1960

## 2016-08-08 ENCOUNTER — Ambulatory Visit: Payer: Medicare Other | Admitting: Physical Therapy

## 2016-08-08 DIAGNOSIS — M62830 Muscle spasm of back: Secondary | ICD-10-CM

## 2016-08-08 DIAGNOSIS — R2689 Other abnormalities of gait and mobility: Secondary | ICD-10-CM

## 2016-08-08 DIAGNOSIS — G8929 Other chronic pain: Secondary | ICD-10-CM

## 2016-08-08 DIAGNOSIS — R293 Abnormal posture: Secondary | ICD-10-CM

## 2016-08-08 DIAGNOSIS — M545 Low back pain: Principal | ICD-10-CM

## 2016-08-08 DIAGNOSIS — M6281 Muscle weakness (generalized): Secondary | ICD-10-CM

## 2016-08-08 NOTE — Therapy (Signed)
[place], [place], [address] Phone: [phone]   Fax:  [phone]  Physical Therapy Treatment / Discharge Note  Patient Details  Name: Kelsey Wilson MRN: [id number] Date of Birth: [birth year] Referring Provider: Gomez Cleverly MD  Encounter Date: 08/08/2016      PT End of Session - 08/08/16 1413    Visit Number 7   Number of Visits 13   Date for PT Re-Evaluation 09/13/16   Authorization Type Medicare: Kx mod by 15th visit, progress note by 10th visits   PT Start Time 1345   PT Stop Time 1415   PT Time Calculation (min) 30 min   Activity Tolerance Patient tolerated treatment well   Behavior During Therapy [facility] for tasks assessed/performed      Past Medical History:  Diagnosis Date  . Anemia   . Anxiety   . Arthritis   . Breast cancer ([place]) 2010   T3N1 invasive ductal carcinoma left breast.Takes Arimidex daily  . Bursitis   . Carpal tunnel syndrome   . Chronic back pain    stenosis  . Constipation    takes Colace daily  . Depression    takes Benzotropine daily  . Diverticulitis of colon   . Fibromyalgia 08/2012  . GERD (gastroesophageal reflux disease)    takes Dexilant daily  . Hemorrhoid   . History of blood transfusion    no abnormal reaction noted  . History of colon polyps    benign  . History of shingles   . Joint pain   . Joint swelling   . Night muscle spasms    takes Flexeril nightly as needed  . Nocturia   . Peripheral edema    takes Furosemide.Just started 01/18/16  . Peripheral neuropathy (HCC)    takes Lyrica daily  . Pneumonia    hx of-2015  . Seasonal allergies    takes Singulair nightly  . SOB (shortness of breath) on exertion    rarely with exertion  . 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 Alphonzo Grieve)    Past Surgical History:  Procedure  Laterality Date  . ABDOMINAL HYSTERECTOMY     still has ovaries  . APPENDECTOMY    . AXILLARY LYMPH NODE DISSECTION  11/28/2011   Procedure: AXILLARY LYMPH NODE DISSECTION;  Surgeon: Edward Jolly, MD;  Location: [place];  Service: General;  Laterality: Left;  . BREAST SURGERY Left 2013  . CARPAL TUNNEL RELEASE     Bilateral  . CHOLECYSTECTOMY    . COLONOSCOPY    . EYE SURGERY Left    cataract removal  . KNEE SURGERY     Left Knee  . LUMBAR LAMINECTOMY/DECOMPRESSION MICRODISCECTOMY Bilateral 01/27/2016   Procedure: Laminectomy and Foraminotomy - Lumbar four -Lumbar five - bilateral- on-lay noninstrumented fusion;  Surgeon: Eustace Moore, MD;  Location: [place] [facility];  Service: Neurosurgery;  Laterality: Bilateral;  . MULTIPLE EXTRACTIONS WITH ALVEOLOPLASTY N/A 05/11/2016   Procedure: EXTRACTION OF TEETH EIGHTEEN, TWENTY AND TWENTY- NINE;  REMOVAL OF MANDIBULAR TORUS AND EXOSTOSIS;  Surgeon: Diona Browner, DDS;  Location: [place];  Service: Oral Surgery;  Laterality: N/A;  . PORTACATH PLACEMENT  06/18/2011   Procedure: INSERTION PORT-A-CATH;  Surgeon: Edward Jolly, MD;  Location: [place];  Service: General;  Laterality: Right;  right subclavian  . removal portacath  2014  . spur  Apex spur on both big toes  . TOE SURGERY Bilateral   . TONSILLECTOMY    . TOTAL KNEE ARTHROPLASTY Left 09/13/2014  . TOTAL KNEE ARTHROPLASTY Left 09/13/2014   Procedure: LEFT TOTAL KNEE ARTHROPLASTY;  Surgeon: Rod Can, MD;  Location: [place];  Service: Orthopedics;  Laterality: Left;  . TOTAL KNEE ARTHROPLASTY Right 03/10/2015   Procedure: RIGHT TOTAL KNEE ARTHROPLASTY;  Surgeon: Rod Can, MD;  [facility];  Service: Orthopedics;  Laterality: Right;    There were no vitals filed for this visit.      Subjective Assessment - 08/08/16 1349    Subjective " I just got back from an MD appointment so I am tired today. I am doing the same with 8/10 pain"   Currently in  Pain? Yes   Pain Score 8    Pain Location Back   Pain Orientation Lower;Mid   Pain Type Chronic pain   Pain Frequency Constant   Aggravating Factors  any activity   Pain Relieving Factors pain meds, laying down and resting            [facility] PT Assessment - 08/08/16 0001      Observation/Other Assessments   Focus on Therapeutic Outcomes (FOTO)  76% limited     AROM   Lumbar Flexion 12   Lumbar Extension 10   Lumbar - Right Side Bend 3   Lumbar - Left Side Bend 3     Strength   Overall Strength Other (comment)  limited effort with strength testing   Right Hip Flexion 3/5   Right Hip Extension 3-/5   Right Hip ABduction 3-/5   Right Hip ADduction 3-/5   Left Hip Flexion 3-/5   Left Hip Extension 3-/5   Left Hip ABduction 3-/5   Left Hip ADduction 3-/5   Right Knee Flexion 3/5   Right Knee Extension 3/5   Left Knee Flexion 3/5   Left Knee Extension 3/5                     [facility] Adult PT Treatment/Exercise - 08/08/16 1353      Lumbar Exercises: Stretches   Passive Hamstring Stretch 2 reps;30 seconds   Lower Trunk Rotation 3 reps;60 seconds     Lumbar Exercises: Aerobic   Stationary Bike Nustep L 4 UE/LE x 6 minutes                 PT Education - 08/08/16 1414    Education provided Yes   Education Details reviewed previously provided HEP and to continued with HEP   Person(s) Educated Patient   Methods Explanation;Verbal cues   Comprehension Verbalized understanding;Verbal cues required          PT Short Term Goals - 08/01/16 1252      PT SHORT TERM GOAL #1   Title pt will be I with inital HEP (08/09/2016)   Time 3   Period Weeks   Status Achieved     PT SHORT TERM GOAL #2   Title she will be able to verbalize and demo proper posture, walking mechanics and bending/ lifting mechanics to prevent and reduce low back pain (08/09/2016)   Time 3   Period Weeks   Status On-going     PT SHORT TERM GOAL #3   Title pt will demonstrate  reduce muscle spasm / tightness in the mid/ low back to reduce pain to </= 8/10 pain and promote trunk mobility (08/09/2016)   Baseline 8/10  consistently, 6/10 least    Time 3   Period Weeks   Status Achieved           PT Long Term Goals - 07/19/16 1030      PT LONG TERM GOAL #1   Title she will be I with all HEP given as of last visit (09/13/2016)   Time 6   Period Weeks   Status New     PT LONG TERM GOAL #2   Title pt will increase trunk mobility by >/= 10 degrees in all planes with </= 5/10 pain for mobility required for ADLS (3/71/6967)   Time 6   Period Weeks   Status New     PT LONG TERM GOAL #3   Title she will increase bil LE strength to >/= 4/5 for prolonged standing/ walking and maintaining safety (09/13/2016)   Time 6   Period Weeks   Status New     PT LONG TERM GOAL #4   Title she will be able to sit/ stand/ walk for >/= 20 min with </= 5/10 pain for functional endurance required for ADLS and community mobility (09/13/2016)   Time 6   Period Weeks   Status New     PT LONG TERM GOAL #5   Title she will increase her FOTO score to </= 58% limited to demonstate improvement in function (09/13/2016)   Time [place] - 08-22-16 1421    Clinical Impression Statement Mrs. Feehan continues to report 8/10 despite exhibit no indication of that level of pain. Reassess mobility and strength which she demonstrates no progress in AROM/ strength. she has dropped her FOTO score to 24 from 32. No goals have been met. due to lack of progress and continued report of intense pain she is going to be discharged from physical therapy to follow back up with her MD for further assessment.    PT Next Visit Plan D/c   Consulted and Agree with Plan of Care Patient      Patient will benefit from skilled therapeutic intervention in order to improve the following deficits and impairments:     Visit Diagnosis: Chronic bilateral low back pain,  with sciatica presence unspecified  Abnormal posture  Muscle spasm of back  Muscle weakness (generalized)  Other abnormalities of gait and mobility       G-Codes - 08/22/2016 1423    Functional Assessment Tool Used (Outpatient Only) FOTO/ clinical judgment   Functional Limitation Mobility: Walking and moving around   Mobility: Walking and Moving Around Goal Status [phone]) At least 40 percent but less than 60 percent impaired, limited or restricted   Mobility: Walking and Moving Around Discharge Status [phone]) At least 60 percent but less than 80 percent impaired, limited or restricted      Problem List Patient Active Problem List   Diagnosis Date Noted  . Neuropathic pain 06/25/2016  . 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 S/P lumbar spinal fusion 01/27/2016  . H/O therapeutic radiation 09/21/2015  . Pes planus of both feet 08/04/2015  . Primary osteoarthritis of right knee 03/10/2015  . Osteoarthritis of right knee 02/04/2015  . Primary osteoarthritis of left knee 09/13/2014  . Hot flashes 01/19/2014  . Abdominal pain, unspecified site 01/19/2014  . Malignant neoplasm of upper-outer quadrant of left breast in female, estrogen receptor positive ([place]) 03/19/2013  . Lumbosacral spondylosis without  myelopathy 12/30/2012  . Fibromyalgia syndrome 08/15/2012  . Peripheral neuropathy, toxic 08/15/2012  . Lymphedema of arm - left 04/30/2012   Starr Lake PT, DPT, LAT, ATC  08/08/16  2:25 PM      [facility] [address] [place], [place], [address] Phone: [phone]   Fax:  [phone]  Name: Kelsey Wilson MRN: [id number] Date of Birth: [birth year]    PHYSICAL THERAPY DISCHARGE SUMMARY  Visits from Start of Care: 7  Current functional level related to goals / functional outcomes: See goals/ FOTO score 76% limited   Remaining deficits: Limited trunk mobility, weakness in bil hips. Pain noted in low back,  bil knees/ ankles. Limited endurance with standing/ walking.    Education / Equipment: HEP, posture, lifting/ carrying biomechanics, theraband  Plan: Patient agrees to discharge.  Patient goals were partially met. Patient is being discharged due to lack of progress.  ?????     Kristoffer Leamon PT, DPT, LAT, ATC  08/08/16  2:54 PM

## 2016-08-13 ENCOUNTER — Ambulatory Visit: Payer: Medicare Other | Admitting: Physical Therapy

## 2016-08-15 ENCOUNTER — Ambulatory Visit: Payer: Medicare Other | Admitting: Physical Therapy

## 2016-08-28 DIAGNOSIS — H81399 Other peripheral vertigo, unspecified ear: Secondary | ICD-10-CM | POA: Insufficient documentation

## 2016-08-29 ENCOUNTER — Other Ambulatory Visit: Payer: Self-pay | Admitting: Otolaryngology

## 2016-08-29 DIAGNOSIS — H8110 Benign paroxysmal vertigo, unspecified ear: Secondary | ICD-10-CM

## 2016-09-04 ENCOUNTER — Ambulatory Visit (HOSPITAL_BASED_OUTPATIENT_CLINIC_OR_DEPARTMENT_OTHER): Payer: Medicare Other | Admitting: Physical Medicine & Rehabilitation

## 2016-09-04 ENCOUNTER — Encounter: Payer: Self-pay | Admitting: Physical Medicine & Rehabilitation

## 2016-09-04 ENCOUNTER — Encounter: Payer: Medicare Other | Attending: Physical Medicine & Rehabilitation

## 2016-09-04 VITALS — BP 109/72 | HR 72 | Resp 14

## 2016-09-04 DIAGNOSIS — Z9889 Other specified postprocedural states: Secondary | ICD-10-CM | POA: Insufficient documentation

## 2016-09-04 DIAGNOSIS — M797 Fibromyalgia: Secondary | ICD-10-CM | POA: Insufficient documentation

## 2016-09-04 DIAGNOSIS — M79642 Pain in left hand: Secondary | ICD-10-CM | POA: Diagnosis not present

## 2016-09-04 DIAGNOSIS — M25569 Pain in unspecified knee: Secondary | ICD-10-CM | POA: Insufficient documentation

## 2016-09-04 DIAGNOSIS — M961 Postlaminectomy syndrome, not elsewhere classified: Secondary | ICD-10-CM | POA: Diagnosis not present

## 2016-09-04 DIAGNOSIS — M25519 Pain in unspecified shoulder: Secondary | ICD-10-CM | POA: Insufficient documentation

## 2016-09-04 DIAGNOSIS — M549 Dorsalgia, unspecified: Secondary | ICD-10-CM | POA: Diagnosis present

## 2016-09-04 DIAGNOSIS — Z90710 Acquired absence of both cervix and uterus: Secondary | ICD-10-CM | POA: Insufficient documentation

## 2016-09-04 DIAGNOSIS — M79641 Pain in right hand: Secondary | ICD-10-CM

## 2016-09-04 DIAGNOSIS — M792 Neuralgia and neuritis, unspecified: Secondary | ICD-10-CM

## 2016-09-04 MED ORDER — PREGABALIN 100 MG PO CAPS: 100.0000 mg | ORAL_CAPSULE | Freq: Three times a day (TID) | ORAL | 5 refills | Status: DC

## 2016-09-04 MED ORDER — DICLOFENAC SODIUM 1 % TD GEL: 2.0000 g | Freq: Four times a day (QID) | TRANSDERMAL | 1 refills | Status: DC

## 2016-09-04 MED FILL — traMADol HCL 50 MG TABS: 50 | 30 days supply | Qty: 180 | Fill #1

## 2016-09-04 MED FILL — LYRICA 100 MG CAPSULE: 100 | 30 days supply | Qty: 90 | Fill #0

## 2016-09-04 MED FILL — ANASTROZOLE 1 MG TABLET: 1 | 30 days supply | Qty: 30 | Fill #2

## 2016-09-04 NOTE — Progress Notes (Signed)
Subjective:    Patient ID: Kelsey Wilson, female    DOB: 24-Apr-1961, 56 y.o.   MRN: [id number]  HPI Chief complaint is bilateral hand pain  1 month history of spontaneous onset, patient notes this when she is sleeping, progressing to involve wrists and left forearm No neck pain. No prior history of a similar episode. Problem with holding objects as well as carrying objects. Difficulty opening up pill bottles. No numbness, tingling noted Patient also has foot pain. Foot pain has not increased with the hand pain. Pt with hx of carpal tunnel but relaese on RIght was not helpful Pain Inventory Average Pain 10 Pain Right Now 10 My pain is aching  In the last 24 hours, has pain interfered with the following? General activity 9 Relation with others 8 Enjoyment of life 8 What TIME of day is your pain at its worst? all Sleep (in general) Poor  Pain is worse with: standing Pain improves with: nothing Relief from Meds: 0  Mobility walk without assistance walk with assistance use a cane ability to climb steps?  no do you drive?  yes  Function not employed: date last employed . disabled: date disabled . I need assistance with the following:  household duties  Neuro/Psych numbness spasms dizziness depression anxiety  Prior Studies Any changes since last visit?  no  Physicians involved in your care Any changes since last visit?  no   Family History  Problem Relation Age of Onset  . Hypertension Mother   . Diabetes Mother   . Hypertension Father   . Diabetes Father   . Cancer Paternal Grandmother     unknown  . Colon cancer Neg Hx    Social History   Social History  . Marital status: Single    Spouse name: N/A  . Number of children: 3  . Years of education: N/A   Social History Main Topics  . Smoking status: Never Smoker  . Smokeless tobacco: Never Used  . Alcohol use No  . Drug use: No  . Sexual activity: No   Other Topics Concern  . None    Social History Narrative  . None   Past Surgical History:  Procedure Laterality Date  . ABDOMINAL HYSTERECTOMY     still has ovaries  . APPENDECTOMY    . AXILLARY LYMPH NODE DISSECTION  11/28/2011   Procedure: AXILLARY LYMPH NODE DISSECTION;  Surgeon: Edward Jolly, MD;  Location: [facility];  Service: General;  Laterality: Left;  . BREAST SURGERY Left 2013  . CARPAL TUNNEL RELEASE     Bilateral  . CHOLECYSTECTOMY    . COLONOSCOPY    . EYE SURGERY Left    cataract removal  . KNEE SURGERY     Left Knee  . LUMBAR LAMINECTOMY/DECOMPRESSION MICRODISCECTOMY Bilateral 01/27/2016   Procedure: Laminectomy and Foraminotomy - Lumbar four -Lumbar five - bilateral- on-lay noninstrumented fusion;  Surgeon: Eustace Moore, MD;  Location: [facility];  Service: Neurosurgery;  Laterality: Bilateral;  . MULTIPLE EXTRACTIONS WITH ALVEOLOPLASTY N/A 05/11/2016   Procedure: EXTRACTION OF TEETH EIGHTEEN, TWENTY AND TWENTY- NINE;  REMOVAL OF MANDIBULAR TORUS AND EXOSTOSIS;  Surgeon: Diona Browner, DDS;  Location: [place] [facility];  Service: Oral Surgery;  Laterality: N/A;  . PORTACATH PLACEMENT  06/18/2011   Procedure: INSERTION PORT-A-CATH;  Surgeon: Edward Jolly, MD;  Location: [place];  Service: General;  Laterality: Right;  right subclavian  . removal portacath  2014  . spur  Apex spur on both big toes  . TOE SURGERY Bilateral   . TONSILLECTOMY    . TOTAL KNEE ARTHROPLASTY Left 09/13/2014  . TOTAL KNEE ARTHROPLASTY Left 09/13/2014   Procedure: LEFT TOTAL KNEE ARTHROPLASTY;  Surgeon: Rod Can, MD;  Location: [place];  Service: Orthopedics;  Laterality: Left;  . TOTAL KNEE ARTHROPLASTY Right 03/10/2015   Procedure: RIGHT TOTAL KNEE ARTHROPLASTY;  Surgeon: Rod Can, MD;  Location: [facility];  Service: Orthopedics;  Laterality: Right;   Past Medical History:  Diagnosis Date  . Anemia   . Anxiety   . Arthritis   . Breast cancer ([place]) 2010   T3N1 invasive ductal carcinoma left  breast.Takes Arimidex daily  . Bursitis   . Carpal tunnel syndrome   . Chronic back pain    stenosis  . Constipation    takes Colace daily  . Depression    takes Benzotropine daily  . Diverticulitis of colon   . Fibromyalgia 08/2012  . GERD (gastroesophageal reflux disease)    takes Dexilant daily  . Hemorrhoid   . History of blood transfusion    no abnormal reaction noted  . History of colon polyps    benign  . History of shingles   . Joint pain   . Joint swelling   . Night muscle spasms    takes Flexeril nightly as needed  . Nocturia   . Peripheral edema    takes Furosemide.Just started 01/18/16  . Peripheral neuropathy (HCC)    takes Lyrica daily  . Pneumonia    hx of-2015  . Seasonal allergies    takes Singulair nightly  . SOB (shortness of breath) on exertion    rarely with exertion  . 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 Alphonzo Grieve)   BP 109/72 (BP Location: Right Arm, Patient Position: Sitting, Cuff Size: Large)   Pulse 72   Resp 14   SpO2 97%   Opioid Risk Score:   Fall Risk Score:  `1  Depression screen PHQ 2/9  Depression screen [facility] 2/9 08/23/2015 04/26/2015 02/04/2015  Decreased Interest 2 0 0  Down, Depressed, Hopeless 0 0 0  PHQ - 2 Score 2 0 0  Altered sleeping 1 - -  Tired, decreased energy 3 - -  Change in appetite 3 - -  Feeling bad or failure about yourself  0 - -  Trouble concentrating 3 - -  Moving slowly or fidgety/restless 2 - -  Suicidal thoughts 0 - -  PHQ-9 Score 14 - -  Difficult doing work/chores Very difficult - -  Some recent data might be hidden    Review of Systems  Constitutional: Positive for diaphoresis and unexpected weight change.  HENT: Negative.   Eyes: Negative.   Respiratory: Negative.   Cardiovascular: Negative.   Gastrointestinal: Negative.   Endocrine: Negative.   Musculoskeletal: Positive for  arthralgias, back pain, gait problem and myalgias.       Spasms   Skin: Negative.   Allergic/Immunologic: Negative.   Neurological: Positive for dizziness and numbness.  Hematological: Negative.   Psychiatric/Behavioral: Positive for dysphoric mood. The patient is nervous/anxious.        Objective:   Physical Exam  Constitutional: She is oriented to person, place, and time. She appears well-developed and well-nourished.  HENT:  Head: Normocephalic.  Eyes: Conjunctivae and EOM are normal. Pupils are equal, round, and reactive to light.  Neck:  Normal range of motion.  Neurological: She is alert and oriented to person, place, and time.  Nursing note and vitals reviewed.  Negative tinel's Negative phalens  No MCP, PIP or DIP swelling no CMC swelling or wrist efusion       Assessment & Plan:  1. Bilateral hand pain. This is chronic, has had right carpal tunnel release without benefit, patient refused left carpal tunnel release. May have had some polyneuropathy related to chemotherapy , although no signs of intrinsic atrophy or weakness. No evidence of joint deformity or synovitis. Had some x-rays performed at primary care she will follow up with that. We will add Voltaren gel 4 times a day to hands  We'll continue Lyrica 100 mg 3 times a day Continue tramadol 100 mg 3 times a day Patient has discontinued Tylenol with codeine due to excessive sedation, this was prescribed by PCP

## 2016-09-15 ENCOUNTER — Ambulatory Visit
Admission: RE | Admit: 2016-09-15 | Discharge: 2016-09-15 | Disposition: A | Discharge status: Home / Self Care | Payer: Medicare Other | Source: Ambulatory Visit | Attending: Otolaryngology | Admitting: Otolaryngology

## 2016-09-15 DIAGNOSIS — H8110 Benign paroxysmal vertigo, unspecified ear: Secondary | ICD-10-CM

## 2016-09-15 IMAGING — MR MR HEAD WO/W CM
11 of 12 series · 43 of 48 positions shown · IV contrast (20 ml multihance)
Comparison: MRI brain 02/28/2012

CLINICAL DATA: Haustral vertigo.  Dizziness.

EXAM:
MRI HEAD WITHOUT AND WITH CONTRAST
TECHNIQUE: Multiplanar, multiecho pulse sequences of the brain and surrounding
structures were obtained without and with intravenous contrast.
CONTRAST:  20mL MULTIHANCE GADOBENATE DIMEGLUMINE 529 MG/ML IV SOLN

[Series 5: T1 · sagittal · 4.0mm · 0.72mm/px · 3 of 25 slices shown (1 of 3)]
[im 1/25]
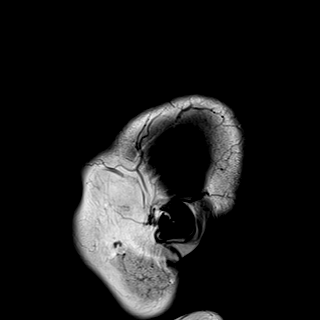
[im 13/25]
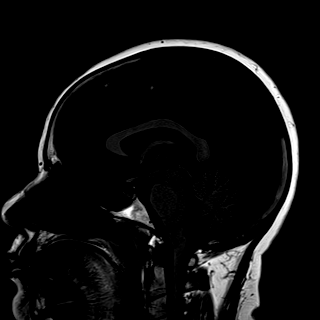
[im 25/25]
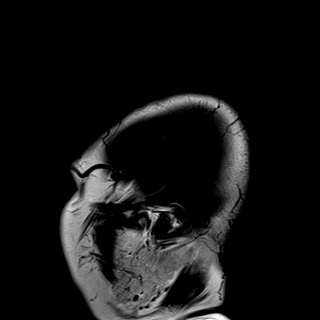

[Series 6: DWI · axial · 3.0mm · 1.44mm/px · z∈[-99,+59]mm · 8 of 84 slices shown (1 of 2)]
[im 1/84]
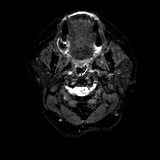
[im 12/84]
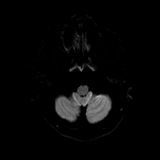
[im 24/84]
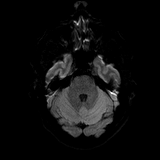
[im 36/84]
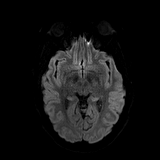
[im 48/84]
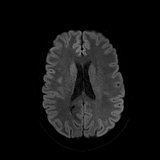
[im 60/84]
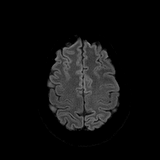
[im 72/84]
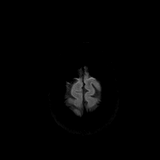
[im 84/84]
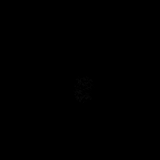

[Series 7: DWI · axial · 3.0mm · 1.44mm/px · z∈[-99,+59]mm · 3 of 42 slices shown (2 of 2)]
[im 1/42]
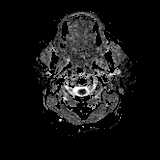
[im 21/42]
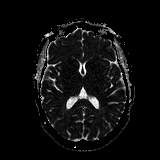
[im 42/42]
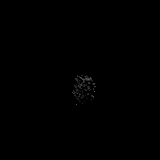

[Series 8: T2 · axial · 4.0mm · 0.36mm/px · z∈[-83,+61]mm · 2 of 29 slices shown]
[im 1/29]
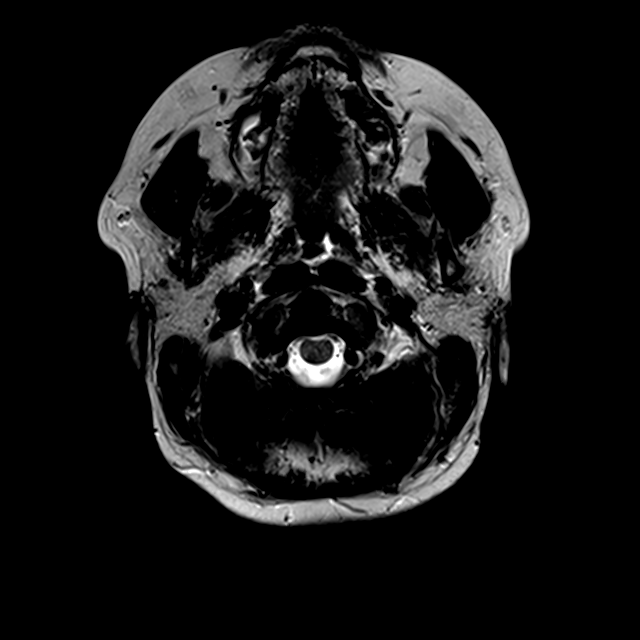
[im 29/29]
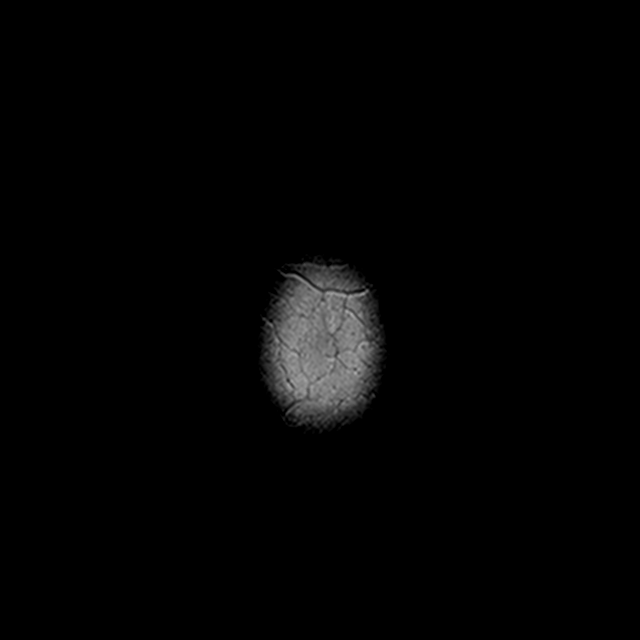

[Series 9: FLAIR · axial · 3.0mm · 0.72mm/px · z∈[-92,+69]mm · 2 of 28 slices shown]
[im 1/28]
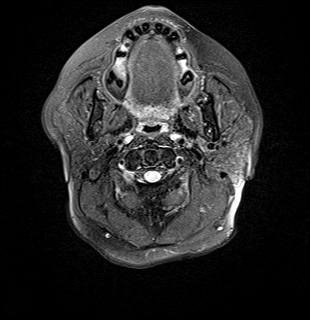
[im 28/28]
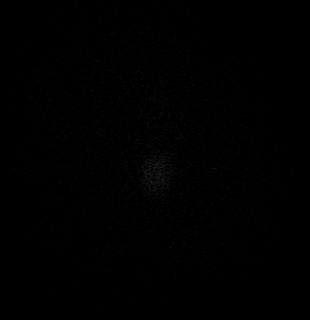

[Series 11: swi_images · axial · 1.5mm · 0.90mm/px · z∈[-91,+63]mm · 8 of 104 slices shown]
[im 1/104]
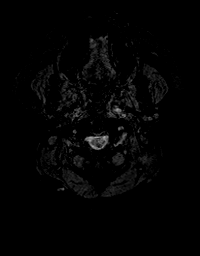
[im 15/104]
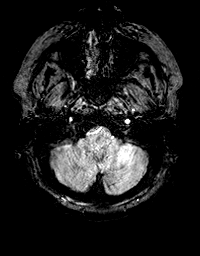
[im 30/104]
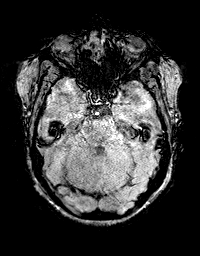
[im 45/104]
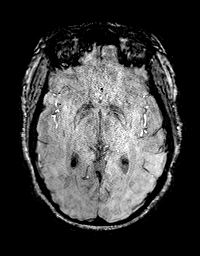
[im 59/104]
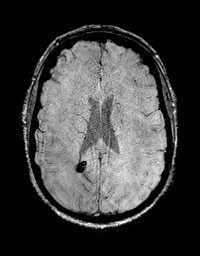
[im 74/104]
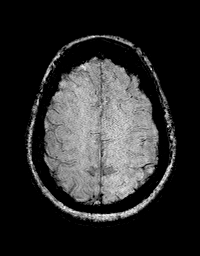
[im 89/104]
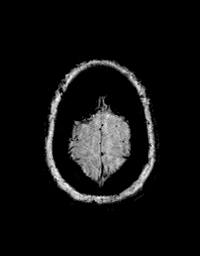
[im 104/104]
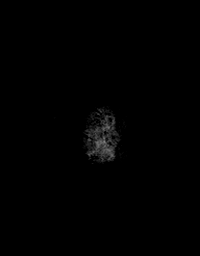

[Series 12: T1 · coronal · 2.5mm · 0.56mm/px · 1 of 13 slices shown (2 of 3)]
[im 1/13]
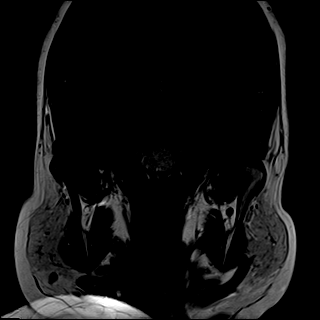

[Series 13: T1 · axial · 2.5mm · 0.50mm/px · 1 of 13 slices shown (3 of 3)]
[im 1/13]
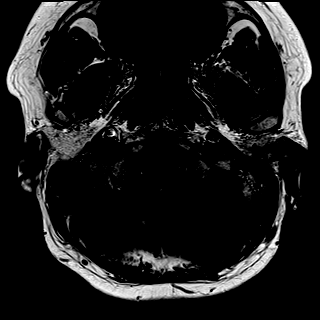

[Series 15: T1 post-contrast · coronal · 2.5mm · 0.56mm/px · 1 of 13 slices shown (1 of 3)]
[im 1/13]
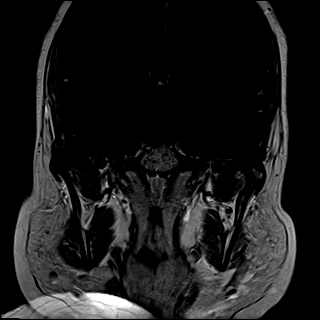

[Series 16: T1 post-contrast · axial · 2.5mm · 0.50mm/px · 1 of 13 slices shown (2 of 3)]
[im 1/13]
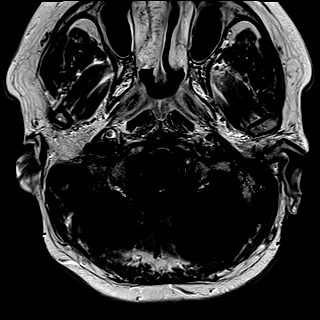

[Series 17: T1 post-contrast · axial · 1.0mm · 0.90mm/px · z∈[-91,+67]mm · 13 of 160 slices shown (3 of 3)]
[im 1/160]
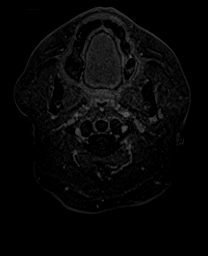
[im 14/160]
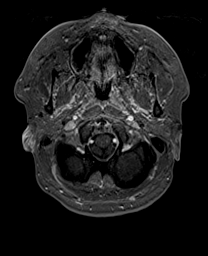
[im 27/160]
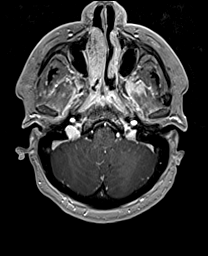
[im 40/160]
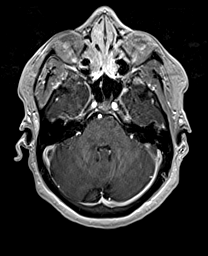
[im 54/160]
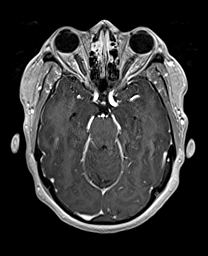
[im 67/160]
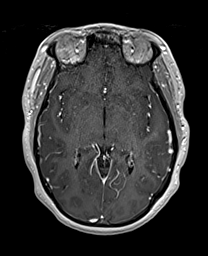
[im 80/160]
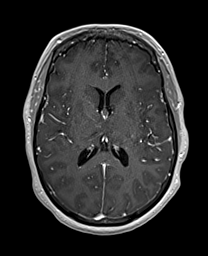
[im 93/160]
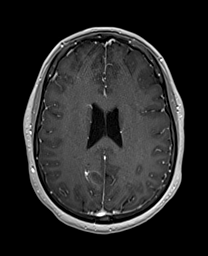
[im 107/160]
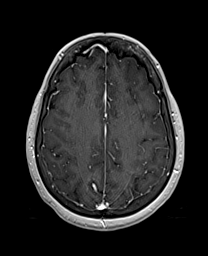
[im 120/160]
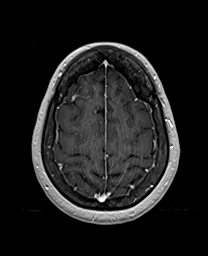
[im 133/160]
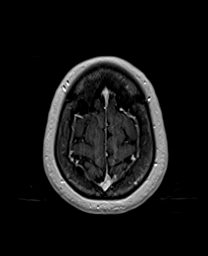
[im 146/160]
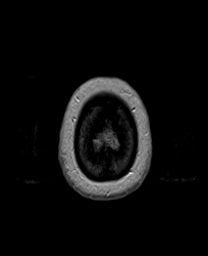
[im 160/160]
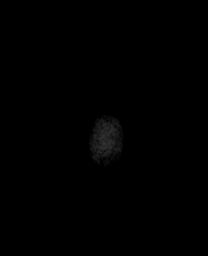

[43 of 48 positions shown; findings below may reference images not displayed]

FINDINGS: Brain: No acute infarct, hemorrhage, or mass lesion is present.
Scattered periventricular and subcortical T2 hyperintensities in the
anterior frontal lobes bilaterally are stable, mildly advanced for
age. Cerebellar tonsils are again noted to extend to the foramen
magnum without significant extrusion in the midline. No new white
matter disease is present. A right parietal developmental venous
anomaly and associated cerebral cavernous venous malformation of the
splenium of the corpus callosum is stable. No other pathologic
enhancement is present.

Vascular: Flow is present in the major intracranial arteries.

Skull and upper cervical spine: The skullbase is within normal
limits. The craniocervical junction is otherwise normal. Midline
sagittal structures are unremarkable.

Sinuses/Orbits: Mild circumferential mucosal thickening is present
in the maxillary sinuses a ethmoid air cells bilaterally. The
frontal sinuses are hypoplastic. Sphenoid sinuses and mastoid air
cells are clear.

Other: Dedicated imaging of the internal auditory canals
demonstrates no pathologic enhancement. The seventh and eighth
cranial nerves are discretely visualized. The inner ear structures
are normally formed. No mass lesion is present.
IMPRESSION: 1. Mild scattered bifrontal subcortical T2 hyperintensities are
stable. The finding is nonspecific but can be seen in the setting of
chronic microvascular ischemia, a demyelinating process such as
multiple sclerosis, vasculitis, complicated migraine headaches, or
as the sequelae of a prior infectious or inflammatory process.
2. Stable right parietal developmental venous angioma and corpus
callosum cerebral cavernous venous malformation.
3. Normal appearance of the internal auditory canals bilaterally.
4. No acute or focal lesion to explain the patient's vertigo.
5. Mild diffuse sinus disease.

## 2016-09-15 MED ORDER — GADOBENATE DIMEGLUMINE 529 MG/ML IV SOLN
20.0000 mL | Freq: Once | INTRAVENOUS | Status: AC | PRN
  Administered 2016-09-15: 20 mL via INTRAVENOUS

## 2016-10-09 MED FILL — LYRICA 100 MG CAPSULE: 100 | 30 days supply | Qty: 90 | Fill #1

## 2016-10-09 MED FILL — ANASTROZOLE 1 MG TABLET: 1 | 30 days supply | Qty: 30 | Fill #3

## 2016-10-10 MED FILL — VOLTAREN 1% GEL: 1 | 37 days supply | Qty: 300 | Fill #0

## 2016-10-12 ENCOUNTER — Encounter: Payer: Self-pay | Admitting: Neurology

## 2016-10-12 ENCOUNTER — Ambulatory Visit (INDEPENDENT_AMBULATORY_CARE_PROVIDER_SITE_OTHER): Payer: Medicare Other | Admitting: Neurology

## 2016-10-12 VITALS — BP 107/70 | HR 86 | Ht 63.0 in | Wt 253.0 lb

## 2016-10-12 DIAGNOSIS — R42 Dizziness and giddiness: Secondary | ICD-10-CM | POA: Insufficient documentation

## 2016-10-12 NOTE — Patient Instructions (Signed)
   We will make a referral for vestibular rehab.

## 2016-10-12 NOTE — Progress Notes (Signed)
Reason for visit: Vertigo  Referring physician: Dr. Bartolo Darter is a 56 y.o. female  History of present illness:  Kelsey Wilson is a 56 year old right-handed black female with a history of fibromyalgia and obesity. The patient indicates that she had onset of vertigo that began about 2 months ago. The patient noted the episode initially when she rolled to the left side while in bed, she developed vertigo with a spinning sensation. The patient has noted vertigo off and on since that time that generally occurs when she looks up or looks down. The episodes may last several minutes and then clear. The patient has some chronic issues with reflux and she may have some nausea, but she does not have significant nausea and vomiting with vertigo. She has reported some trouble swallowing, she has had a recent swallowing study in February 2018 that showed some oral pharyngeal dysphagia. The patient reports some occasional double vision, no droopy eyelid problems. The patient has not had slurred speech or numbness or weakness of the extremities. She does report some occasional headaches in the frontotemporal areas. She denies any syncope, she has some problems with urinary urgency and occasional incontinence. She was seen by Dr. Redmond Baseman from ENT, MRI of the brain was done, this reveals evidence of minimal white matter changes. No acute changes are seen, there is no evidence of brainstem involvement. The patient is sent to this office for an evaluation. The patient reports no medication changes around the time of onset of symptoms. She recently was placed on CPAP for sleep apnea. The patient does report some chronic ear pain over the last 4 months bilaterally. No change in hearing has been noted.   Past Medical History:  Diagnosis Date  . Anemia   . Anxiety   . Arthritis   . Breast cancer ([place]) 2010   T3N1 invasive ductal carcinoma left breast.Takes Arimidex daily  . Bursitis   . Carpal tunnel  syndrome   . Chronic back pain    stenosis  . Constipation    takes Colace daily  . Depression    takes Benzotropine daily  . Diverticulitis of colon   . Fibromyalgia 08/2012  . GERD (gastroesophageal reflux disease)    takes Dexilant daily  . Hemorrhoid   . History of blood transfusion    no abnormal reaction noted  . History of colon polyps    benign  . History of shingles   . Joint pain   . Joint swelling   . Night muscle spasms    takes Flexeril nightly as needed  . Nocturia   . OSA (obstructive sleep apnea)   . OSA on CPAP   . Peripheral edema    takes Furosemide.Just started 01/18/16  . Peripheral neuropathy    takes Lyrica daily  . Pneumonia    hx of-2015  . Seasonal allergies    takes Singulair nightly  . SOB (shortness of breath) on exertion    rarely with exertion  . 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 Alphonzo Grieve)    Past Surgical History:  Procedure Laterality Date  . ABDOMINAL HYSTERECTOMY     still has ovaries  . APPENDECTOMY    . AXILLARY LYMPH NODE DISSECTION  11/28/2011   Procedure: AXILLARY LYMPH NODE DISSECTION;  Surgeon: Edward Jolly, MD;  Location: [place];  Service: General;  Laterality: Left;  .  BREAST SURGERY Left 2013  . CARPAL TUNNEL RELEASE     Bilateral  . CHOLECYSTECTOMY    . COLONOSCOPY    . EYE SURGERY Left    cataract removal  . KNEE SURGERY     Left Knee  . LUMBAR LAMINECTOMY/DECOMPRESSION MICRODISCECTOMY Bilateral 01/27/2016   Procedure: Laminectomy and Foraminotomy - Lumbar four -Lumbar five - bilateral- on-lay noninstrumented fusion;  Surgeon: Eustace Moore, MD;  Location: [facility];  Service: Neurosurgery;  Laterality: Bilateral;  . MULTIPLE EXTRACTIONS WITH ALVEOLOPLASTY N/A 05/11/2016   Procedure: EXTRACTION OF TEETH EIGHTEEN, TWENTY AND TWENTY- NINE;  REMOVAL OF MANDIBULAR TORUS AND EXOSTOSIS;   Surgeon: Diona Browner, DDS;  Location: [place];  Service: Oral Surgery;  Laterality: N/A;  . PORTACATH PLACEMENT  06/18/2011   Procedure: INSERTION PORT-A-CATH;  Surgeon: Edward Jolly, MD;  Location: [place];  Service: General;  Laterality: Right;  right subclavian  . removal portacath  2014  . spur     Apex spur on both big toes  . TOE SURGERY Bilateral   . TONSILLECTOMY    . TOTAL KNEE ARTHROPLASTY Left 09/13/2014  . TOTAL KNEE ARTHROPLASTY Left 09/13/2014   Procedure: LEFT TOTAL KNEE ARTHROPLASTY;  Surgeon: Rod Can, MD;  Location: [place];  Service: Orthopedics;  Laterality: Left;  . TOTAL KNEE ARTHROPLASTY Right 03/10/2015   Procedure: RIGHT TOTAL KNEE ARTHROPLASTY;  Surgeon: Rod Can, MD;  Location: [facility];  Service: Orthopedics;  Laterality: Right;    Family History  Problem Relation Age of Onset  . Hypertension Mother   . Diabetes Mother   . Hypertension Father   . Diabetes Father   . Cancer Paternal Grandmother        unknown  . Colon cancer Neg Hx     Social history:  reports that she has never smoked. She has never used smokeless tobacco. She reports that she does not drink alcohol or use drugs.  Medications:  Prior to Admission medications   Medication Sig Start Date End Date Taking? Authorizing Provider  anastrozole (ARIMIDEX) 1 MG tablet Take 1 mg by mouth daily.   Yes [provider]  benzonatate (TESSALON) 100 MG capsule Take by mouth 3 (three) times daily as needed for cough.   Yes [provider]  benztropine (COGENTIN) 0.5 MG tablet Take 0.5 mg by mouth 2 (two) times daily.   Yes [provider]  cholecalciferol (VITAMIN D) 1000 units tablet Take 1,000 Units by mouth daily. Takes 4,000 units daily   Yes [provider]  clindamycin (CLEOCIN) 300 MG capsule Take 600 mg by mouth once. Takes 2 hours before dental procedures 02/15/16  Yes [provider]  cyclobenzaprine (FLEXERIL) 10 MG  tablet Take 10 mg by mouth 2 (two) times daily. 04/23/16  Yes [provider]  dabigatran (PRADAXA) 150 MG CAPS capsule Take 150 mg by mouth 2 (two) times daily.   Yes [provider]  Dexlansoprazole (DEXILANT) 30 MG capsule Take 30 mg by mouth daily.   Yes [provider]  diclofenac sodium (VOLTAREN) 1 % GEL Apply 2 g topically 4 (four) times daily. 09/04/16  Yes Kirsteins, Luanna Salk, MD  docusate sodium (COLACE) 100 MG capsule Take 1 capsule (100 mg total) by mouth every 12 (twelve) hours. 10/16/15  Yes Recardo Evangelist, PA-C  DULoxetine (CYMBALTA) 60 MG capsule take 1 capsule by mouth once daily 06/28/16  Yes [provider]  furosemide (LASIX) 40 MG tablet Take 40 mg by  mouth.   Yes [provider]  metolazone (ZAROXOLYN) 2.5 MG tablet Take 2.5 mg by mouth daily. 04/23/16  Yes [provider]  montelukast (SINGULAIR) 10 MG tablet Take 10 mg by mouth at bedtime.   Yes [provider]  potassium chloride SA (K-DUR,KLOR-CON) 20 MEQ tablet Take 20 mEq by mouth daily.   Yes [provider]  pregabalin (LYRICA) 100 MG capsule Take 1 capsule (100 mg total) by mouth 3 (three) times daily. 09/04/16  Yes Kirsteins, Luanna Salk, MD  promethazine (PHENERGAN) 25 MG tablet Take 25 mg by mouth every 4 (four) hours as needed for nausea or vomiting.   Yes [provider]  tiZANidine (ZANAFLEX) 4 MG tablet Take 4 mg by mouth every 6 (six) hours as needed for muscle spasms.   Yes [provider]  traMADol (ULTRAM) 50 MG tablet Take 2 tablets (100 mg total) by mouth 3 (three) times daily as needed for moderate pain. 06/25/16  Yes Kirsteins, Luanna Salk, MD      Allergies  Allergen Reactions  . Oxycodone Other (See Comments)    hallucinations  . Aleve [Naproxen] Nausea Only  . Compazine [Prochlorperazine] Other (See Comments)    Numbness of face and  lips  . Penicillins Nausea Only    Has patient had a PCN reaction causing  immediate rash, facial/tongue/throat swelling, SOB or lightheadedness with hypotension: No Has patient had a PCN reaction causing severe rash involving mucus membranes or skin necrosis: No Has patient had a PCN reaction that required hospitalization No Has patient had a PCN reaction occurring within the last 10 years: No If all of the above answers are "NO", then may proceed with Cephalosporin use.     ROS:  Out of a complete 14 system review of symptoms, the patient complains only of the following symptoms, and all other reviewed systems are negative.  Weight gain, fatigue Swelling in the legs Vertigo Birthmarks, moles Shortness of breath, snoring Easy bruising, lymph node enlargement Feeling hot Joint pain, joint swelling, muscle cramps, aching muscles  Blood pressure 107/70, pulse 86, height 5\' 3"  (1.6 m), weight 253 lb (114.8 kg).  Physical Exam  General: The patient is alert and cooperative at the time of the examination. The patient is markedly obese.  Eyes: Pupils are equal, round, and reactive to light. Discs are flat bilaterally.  Ears: Tympanic membranes are clear bilaterally.  Neck: The neck is supple, no carotid bruits are noted.  Respiratory: The respiratory examination is clear.  Cardiovascular: The cardiovascular examination reveals a regular rate and rhythm, no obvious murmurs or rubs are noted.  Skin: Extremities are with 1+ edema below the knees bilaterally.  Neurologic Exam  Mental status: The patient is alert and oriented x 3 at the time of the examination. The patient has apparent normal recent and remote memory, with an apparently normal attention span and concentration ability.  Cranial nerves: Facial symmetry is present. There is good sensation of the face to pinprick and soft touch bilaterally. The strength of the facial muscles and the muscles to head turning and shoulder shrug are normal bilaterally. Speech is well enunciated, no aphasia or  dysarthria is noted. Extraocular movements are full. Visual fields are full. The tongue is midline, and the patient has symmetric elevation of the soft palate. No obvious hearing deficits are noted.  Motor: The motor testing reveals 5 over 5 strength of all 4 extremities. Good symmetric motor tone is noted throughout.  Sensory: Sensory testing is intact  to pinprick, soft touch, vibration sensation, and position sense on all 4 extremities, with exception that there is some decrease in position sense of the right foot, the patient reports total anesthesia to pinprick sensation on arms and legs. No evidence of extinction is noted.  Coordination: Cerebellar testing reveals good finger-nose-finger and heel-to-shin bilaterally. The Nyan-Barrany procedure was done, this results in no subjective vertigo or significant nystagmus.  Gait and station: Gait is normal. Tandem gait is slightly unsteady. Romberg is negative. No drift is seen.  Reflexes: Deep tendon reflexes are symmetric, but are depressed bilaterally. Toes are downgoing bilaterally.   MRI brain 09/15/16:  IMPRESSION: 1. Mild scattered bifrontal subcortical T2 hyperintensities are stable. The finding is nonspecific but can be seen in the setting of chronic microvascular ischemia, a demyelinating process such as multiple sclerosis, vasculitis, complicated migraine headaches, or as the sequelae of a prior infectious or inflammatory process. 2. 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 3. Normal appearance of the internal auditory canals bilaterally. 4. No acute or focal lesion to explain the patient's vertigo. 5. Mild diffuse sinus disease.  * MRI scan images were reviewed online. I agree with the written report.    Assessment/Plan:  1. Episodic vertigo  The patient has had MRI evaluation of the brain that is relatively unremarkable, very minimal white matter  changes are seen. The patient reports onset of vertigo that has a positional component, occurs when she rolls to her left side while in bed, or while looking up or looking down. I will send the patient for vestibular rehabilitation, she will follow-up in 3-4 months. The patient was not symptomatic during the examination today.  Jill Alexanders MD 10/12/2016 10:30 AM  [facility] [address] [place], [place] [address]  Phone [phone] Fax [phone]

## 2016-10-26 ENCOUNTER — Ambulatory Visit: Payer: Medicare Other | Admitting: Podiatry

## 2016-11-06 ENCOUNTER — Ambulatory Visit: Payer: Medicare Other | Attending: Specialist | Admitting: Physical Therapy

## 2016-11-06 DIAGNOSIS — R42 Dizziness and giddiness: Secondary | ICD-10-CM | POA: Diagnosis present

## 2016-11-06 DIAGNOSIS — R262 Difficulty in walking, not elsewhere classified: Secondary | ICD-10-CM | POA: Diagnosis present

## 2016-11-07 NOTE — Therapy (Signed)
[facility] [address] [place], [place], [address] Phone: [phone]   Fax:  [phone]  Physical Therapy Evaluation  Patient Details  Name: Kelsey Wilson MRN: [id number] Date of Birth: 1960-08-05 Referring Provider: Dr. Floyde Parkins  Encounter Date: 11/06/2016      PT End of Session - 11/07/16 1552    Visit Number 1   Number of Visits 9   Date for PT Re-Evaluation 12/07/16   Authorization Type Medicare   Authorization Time Period 11-06-16 - 01-05-17   PT Start Time 1100   PT Stop Time 1149   PT Time Calculation (min) 49 min      Past Medical History:  Diagnosis Date  . Anemia   . Anxiety   . Arthritis   . Breast cancer ([place]) 2010   T3N1 invasive ductal carcinoma left breast.Takes Arimidex daily  . Bursitis   . Carpal tunnel syndrome   . Chronic back pain    stenosis  . Constipation    takes Colace daily  . Depression    takes Benzotropine daily  . Diverticulitis of colon   . Fibromyalgia 08/2012  . GERD (gastroesophageal reflux disease)    takes Dexilant daily  . Hemorrhoid   . History of blood transfusion    no abnormal reaction noted  . History of colon polyps    benign  . History of shingles   . Joint pain   . Joint swelling   . Night muscle spasms    takes Flexeril nightly as needed  . Nocturia   . OSA (obstructive sleep apnea)   . OSA on CPAP   . Peripheral edema    takes Furosemide.Just started 01/18/16  . Peripheral neuropathy    takes Lyrica daily  . Pneumonia    hx of-2015  . Seasonal allergies    takes Singulair nightly  . SOB (shortness of breath) on exertion    rarely with exertion  . 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 Alphonzo Grieve)    Past Surgical History:  Procedure Laterality Date  . ABDOMINAL HYSTERECTOMY     still has ovaries  . APPENDECTOMY     . AXILLARY LYMPH NODE DISSECTION  11/28/2011   Procedure: AXILLARY LYMPH NODE DISSECTION;  Surgeon: Edward Jolly, MD;  Location: [facility];  Service: General;  Laterality: Left;  . BREAST SURGERY Left 2013  . CARPAL TUNNEL RELEASE     Bilateral  . CHOLECYSTECTOMY    . COLONOSCOPY    . EYE SURGERY Left    cataract removal  . KNEE SURGERY     Left Knee  . LUMBAR LAMINECTOMY/DECOMPRESSION MICRODISCECTOMY Bilateral 01/27/2016   Procedure: Laminectomy and Foraminotomy - Lumbar four -Lumbar five - bilateral- on-lay noninstrumented fusion;  Surgeon: Eustace Moore, MD;  Location: [facility];  Service: Neurosurgery;  Laterality: Bilateral;  . MULTIPLE EXTRACTIONS WITH ALVEOLOPLASTY N/A 05/11/2016   Procedure: EXTRACTION OF TEETH EIGHTEEN, TWENTY AND TWENTY- NINE;  REMOVAL OF MANDIBULAR TORUS AND EXOSTOSIS;  Surgeon: Diona Browner, DDS;  Location: [facility];  Service: Oral Surgery;  Laterality: N/A;  . PORTACATH PLACEMENT  06/18/2011   Procedure: INSERTION PORT-A-CATH;  Surgeon: Edward Jolly, MD;  Location: [facility];  Service: General;  Laterality: Right;  right subclavian  . removal portacath  2014  . spur     Apex spur on both big toes  .  TOE SURGERY Bilateral   . TONSILLECTOMY    . TOTAL KNEE ARTHROPLASTY Left 09/13/2014  . TOTAL KNEE ARTHROPLASTY Left 09/13/2014   Procedure: LEFT TOTAL KNEE ARTHROPLASTY;  Surgeon: Rod Can, MD;  Location: [facility];  Service: Orthopedics;  Laterality: Left;  . TOTAL KNEE ARTHROPLASTY Right 03/10/2015   Procedure: RIGHT TOTAL KNEE ARTHROPLASTY;  Surgeon: Rod Can, MD;  Location: [facility];  Service: Orthopedics;  Laterality: Right;    There were no vitals filed for this visit.       Subjective Assessment - 11/07/16 1541    Subjective Pt reports vertigo started a few months ago and occurred when she rolled onto her left side; states it has improved since that initial onset; reports no vertigo at time of evaluation, but states she  had some vertigo last night when she was getting ready to do some ironing   Patient Stated Goals resolve the vertigo   Currently in Pain? No/denies            [facility] PT Assessment - 11/07/16 0001      Assessment   Medical Diagnosis Vertigo   Referring Provider Dr. Floyde Parkins   Onset Date/Surgical Date --  March 2018   Prior Therapy none for vertigo     Precautions   Precautions Other (comment)  intermittent vertigo     Balance Screen   Has the patient fallen in the past 6 months No   Has the patient had a decrease in activity level because of a fear of falling?  No   Is the patient reluctant to leave their home because of a fear of falling?  No            Vestibular Assessment - 11/07/16 0001      Vestibular Assessment   General Observation Pt is a 56 yr old lady with c/o vertigo that started in early March when she turned over onto left side.  Pt was evaluated by Dr. Redmond Baseman, who ordered MRI which showed microvascular ischemia but no acute findings.  She was referred to Dr. Jannifer Franklin by Dr. Redmond Baseman; notes state that pt was asymptomatic at time of neurology appt., but description of vertigo was characteristic of BPPV.       Symptom Behavior   Type of Dizziness Spinning   Frequency of Dizziness varies   Duration of Dizziness seconds   Aggravating Factors Rolling to left;Forward bending   Relieving Factors Lying supine     Visual Acuity   Static line 10   Dynamic line 8 after several tries; pt became upset during testing (due to anxiety?) was able to complete test with encouragement      Positional Testing   Dix-Hallpike Dix-Hallpike Right;Dix-Hallpike Left   Sidelying Test Sidelying Right;Sidelying Left   Horizontal Canal Testing Horizontal Canal Right;Horizontal Canal Left     Dix-Hallpike Right   Dix-Hallpike Right Duration none   Dix-Hallpike Right Symptoms No nystagmus     Dix-Hallpike Left   Dix-Hallpike Left Duration none   Dix-Hallpike Left Symptoms No  nystagmus     Sidelying Right   Sidelying Right Duration none   Sidelying Right Symptoms No nystagmus     Sidelying Left   Sidelying Left Duration none   Sidelying Left Symptoms No nystagmus     Horizontal Canal Right   Horizontal Canal Right Duration none   Horizontal Canal Right Symptoms Normal     Horizontal Canal Left   Horizontal Canal Left Duration none   Horizontal Canal Left  Symptoms Normal        Objective measurements completed on examination: See above findings.                  PT Education - 11/07/16 1652    Education provided Yes   Education Details interpretation of SOT results   Person(s) Educated Patient   Methods Explanation;Handout   Comprehension Verbalized understanding          PT Short Term Goals - 08/01/16 1252      PT SHORT TERM GOAL #1   Title pt will be I with inital HEP (08/09/2016)   Time 3   Period Weeks   Status Achieved     PT SHORT TERM GOAL #2   Title she will be able to verbalize and demo proper posture, walking mechanics and bending/ lifting mechanics to prevent and reduce low back pain (08/09/2016)   Time 3   Period Weeks   Status On-going     PT SHORT TERM GOAL #3   Title pt will demonstrate reduce muscle spasm / tightness in the mid/ low back to reduce pain to </= 8/10 pain and promote trunk mobility (08/09/2016)   Baseline 8/10 consistently, 6/10 least    Time 3   Period Weeks   Status Achieved           PT Long Term Goals - 11/07/16 1605      PT LONG TERM GOAL #1   Title Pt will report at least 25% improvement in vertigo.  12-07-16   Baseline intermittent in occurrence - unable to provoke with positional testing in eval  - 11-06-16   Time 4   Period Weeks   Status New     PT LONG TERM GOAL #2   Title Independent in HEP for vestibular exercises and habituation prn.  11-06-16   Time 4   Period Weeks   Status New     PT LONG TERM GOAL #3   Title Increase FOTO score from 45/100 to >/= 55/100 to demo  improvement in vertigo.  11-06-16   Baseline 45/100   Time 4   Period Weeks   Status New                Plan - 11/07/16 1553    Clinical Impression Statement Pt is a 56 year old lady with c/o intermittent vertigo and also exhibits moderate anxiety/nervousness with evaluation tests.  Pt reports vertigo started in early March and occurred with head movements and with positional changes.  Pt reports no vertigo at time of eval.  Pt had much difficulty with vertically tracking object downward - was uncertain if she understood directions or if she was unable to perform this activity.  After many attempts pt was able to visually track ink pen downward with no abnormalities noted on this 1 rep.  Pt was visibly shaken and unsteady after completing SOT , requiring +2 assist to amb. from balance master to mat table with teeth shattering due to apparent anxiety.  Deficits per SOT not equivalent to pt's functional status as she is ambulatory without device.  Pt's symptoms consistent with BPPV that has mostly resolved at this time, but unclear if pt has vestibular hypofunction as she has nervousness and anxiety.  Pt will benefit from balance exercises and techniques to assist in management of vertigo/stress. anxiety.  History and Personal Factors relevant to plan of care: fibromyalgia, breast cancer 2010, peripheral neuropathy, depression/anxiety, chronic back pain   Clinical Presentation Stable   Clinical Presentation due to: vertigo (BPPV? ) :  anxiety   Clinical Decision Making Low   Rehab Potential Good   PT Frequency 2x / week   PT Duration 4 weeks   PT Treatment/Interventions ADLs/Self Care Home Management;Canalith Repostioning;Stair training;Gait training;Therapeutic activities;Therapeutic exercise;Balance training;Neuromuscular re-education;Patient/family  education;Vestibular   PT Next Visit Plan Reassess vertigo if pt reports having had episode since PT eval on 11-06-16:  if no vertigo, instruct in x1 viewing in standing and in balance on foam with EO and EC for HEP   PT Home Exercise Plan see above   Consulted and Agree with Plan of Care Patient      Patient will benefit from skilled therapeutic intervention in order to improve the following deficits and impairments:  Decreased balance, Impaired sensation, Dizziness, Difficulty walking  Visit Diagnosis: Dizziness and giddiness - Plan: PT plan of care cert/re-cert  Difficulty in walking, not elsewhere classified - Plan: PT plan of care cert/re-cert      G-Codes - 56/31/49 1609    Functional Assessment Tool Used (Outpatient Only) pt reports intermittent vertigo; much difficulty performing SOT    Functional Limitation Changing and maintaining body position   Changing and Maintaining Body Position Current Status (F0263) At least 20 percent but less than 40 percent impaired, limited or restricted   Changing and Maintaining Body Position Goal Status (Z8588) At least 1 percent but less than 20 percent impaired, limited or restricted       Problem List Patient Active Problem List   Diagnosis Date Noted  . Vertigo 10/12/2016  . Neuropathic pain 06/25/2016  . 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 S/P lumbar spinal fusion 01/27/2016  . H/O therapeutic radiation 09/21/2015  . Pes planus of both feet 08/04/2015  . Primary osteoarthritis of right knee 03/10/2015  . Osteoarthritis of right knee 02/04/2015  . Primary osteoarthritis of left knee 09/13/2014  . Hot flashes 01/19/2014  . Abdominal pain, unspecified site 01/19/2014  . Malignant neoplasm of upper-outer quadrant of left breast in female, estrogen receptor positive ([facility] [place]) 03/19/2013  . Lumbosacral spondylosis without myelopathy 12/30/2012  . Fibromyalgia syndrome 08/15/2012  . Peripheral neuropathy, toxic 08/15/2012  . Lymphedema of  arm - left 04/30/2012    Shail Urbas, [place], PT 11/07/2016, 4:52 PM  [facility] [address] [place], [place], [address] Phone: [phone]   Fax:  [phone]  Name: YEILA MORRO MRN: [id number] Date of Birth: Sep 09, 1960

## 2016-11-12 ENCOUNTER — Other Ambulatory Visit: Payer: Self-pay | Admitting: Oncology

## 2016-11-12 DIAGNOSIS — C50412 Malignant neoplasm of upper-outer quadrant of left female breast: Secondary | ICD-10-CM

## 2016-11-12 MED FILL — ANASTROZOLE 1 MG TABLET: 1 | 30 days supply | Qty: 30 | Fill #0

## 2016-11-19 ENCOUNTER — Ambulatory Visit: Payer: Medicare Other | Admitting: Physical Therapy

## 2016-11-19 ENCOUNTER — Encounter: Payer: Self-pay | Admitting: Physical Therapy

## 2016-11-19 DIAGNOSIS — R42 Dizziness and giddiness: Secondary | ICD-10-CM | POA: Diagnosis not present

## 2016-11-19 NOTE — Therapy (Signed)
[facility] [address] [place], [place], [address] Phone: [phone]   Fax:  [phone]  Physical Therapy Treatment  Patient Details  Name: Kelsey Wilson MRN: [id number] Date of Birth: November 16, 1960 Referring Provider: Dr. Floyde Parkins  Encounter Date: 11/19/2016      PT End of Session - 11/19/16 1045    Visit Number 2   Number of Visits 9   Date for PT Re-Evaluation 12/07/16   Authorization Type Medicare   Authorization Time Period 11-06-16 - 01-05-17   PT Start Time 0848   PT Stop Time 0932   PT Time Calculation (min) 44 min   Activity Tolerance Patient tolerated treatment well   Behavior During Therapy [facility] for tasks assessed/performed      Past Medical History:  Diagnosis Date  . Anemia   . Anxiety   . Arthritis   . Breast cancer ([facility]) 2010   T3N1 invasive ductal carcinoma left breast.Takes Arimidex daily  . Bursitis   . Carpal tunnel syndrome   . Chronic back pain    stenosis  . Constipation    takes Colace daily  . Depression    takes Benzotropine daily  . Diverticulitis of colon   . Fibromyalgia 08/2012  . GERD (gastroesophageal reflux disease)    takes Dexilant daily  . Hemorrhoid   . History of blood transfusion    no abnormal reaction noted  . History of colon polyps    benign  . History of shingles   . Joint pain   . Joint swelling   . Night muscle spasms    takes Flexeril nightly as needed  . Nocturia   . OSA (obstructive sleep apnea)   . OSA on CPAP   . Peripheral edema    takes Furosemide.Just started 01/18/16  . Peripheral neuropathy    takes Lyrica daily  . Pneumonia    hx of-2015  . Seasonal allergies    takes Singulair nightly  . SOB (shortness of breath) on exertion    rarely with exertion  . 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 Alphonzo Grieve)    Past  Surgical History:  Procedure Laterality Date  . ABDOMINAL HYSTERECTOMY     still has ovaries  . APPENDECTOMY    . AXILLARY LYMPH NODE DISSECTION  11/28/2011   Procedure: AXILLARY LYMPH NODE DISSECTION;  Surgeon: Edward Jolly, MD;  Location: [facility];  Service: General;  Laterality: Left;  . BREAST SURGERY Left 2013  . CARPAL TUNNEL RELEASE     Bilateral  . CHOLECYSTECTOMY    . COLONOSCOPY    . EYE SURGERY Left    cataract removal  . KNEE SURGERY     Left Knee  . LUMBAR LAMINECTOMY/DECOMPRESSION MICRODISCECTOMY Bilateral 01/27/2016   Procedure: Laminectomy and Foraminotomy - Lumbar four -Lumbar five - bilateral- on-lay noninstrumented fusion;  Surgeon: Eustace Moore, MD;  Location: [facility];  Service: Neurosurgery;  Laterality: Bilateral;  . MULTIPLE EXTRACTIONS WITH ALVEOLOPLASTY N/A 05/11/2016   Procedure: EXTRACTION OF TEETH EIGHTEEN, TWENTY AND TWENTY- NINE;  REMOVAL OF MANDIBULAR TORUS AND EXOSTOSIS;  Surgeon: Diona Browner, DDS;  Location: [facility];  Service: Oral Surgery;  Laterality: N/A;  . PORTACATH PLACEMENT  06/18/2011   Procedure: INSERTION PORT-A-CATH;  Surgeon: Edward Jolly, MD;  Location: [facility];  Service: General;  Laterality: Right;  right subclavian  .  removal portacath  2014  . spur     Apex spur on both big toes  . TOE SURGERY Bilateral   . TONSILLECTOMY    . TOTAL KNEE ARTHROPLASTY Left 09/13/2014  . TOTAL KNEE ARTHROPLASTY Left 09/13/2014   Procedure: LEFT TOTAL KNEE ARTHROPLASTY;  Surgeon: Rod Can, MD;  Location: [facility];  Service: Orthopedics;  Laterality: Left;  . TOTAL KNEE ARTHROPLASTY Right 03/10/2015   Procedure: RIGHT TOTAL KNEE ARTHROPLASTY;  Surgeon: Rod Can, MD;  [facility];  Service: Orthopedics;  Laterality: Right;    There were no vitals filed for this visit.      Subjective Assessment - 11/19/16 0849    Subjective Says she has maybe felt the spinning a couple of times since evaluation. States she  avoids rolling left. Also usually feels symptoms with bending down in her closet.    Limitations Sitting;Lifting;Walking;Standing;House hold activities   Patient Stated Goals resolve the vertigo                Vestibular Assessment - 11/19/16 1038      Occulomotor Exam   Occulomotor Alignment Normal   Spontaneous Absent     Vestibulo-Occular Reflex   VOR 1 Head Only (x 1 viewing) able to do 60 sec, vc to incr veloity and decr ROM; +dizziness for 2 minutes after stopped wit education on gaze stabilization; horiz provoked more symptoms then vertical     Sidelying Left   Sidelying Left Duration none   Sidelying Left Symptoms No nystagmus                 [facility] Adult PT Treatment/Exercise - 11/19/16 0001      Bed Mobility   Bed Mobility Rolling Right;Rolling Left;Left Sidelying to Sit;Sit to Sidelying Left   Rolling Right 6: Modified independent (Device/Increase time)   Rolling Left 6: Modified independent (Device/Increase time)   Left Sidelying to Sit 6: Modified independent (Device/Increase time)   Sit to Sidelying Left 6: Modified independent (Device/Increase time)    Asymptomatic with all bed mobility including rolling left.        Vestibular Treatment/Exercise - 11/19/16 0001      Vestibular Treatment/Exercise   Vestibular Treatment Provided Habituation;Gaze   Habituation Exercises Comment  seated forward bending x 5 reps    Gaze Exercises X1 Viewing Horizontal;X1 Viewing Vertical     X1 Viewing Horizontal   Foot Position seated   Time --  60 sec   Comments +symptomatic with 60 sec, denied visual changes      X1 Viewing Vertical   Foot Position seated   Time --  60 sec   Comments asymptomatic with 60 sec               PT Education - 11/19/16 1043    Education provided Yes   Education Details workings of vestibular system; purpose of exercises; important to work until she feels some symptoms; important to keep eyes open during  recovery; how to progress (or simplify) exercises as needed   Person(s) Educated Patient   Methods Explanation;Demonstration;Verbal cues;Handout;Tactile cues   Comprehension Verbalized understanding;Returned demonstration;Verbal cues required;Tactile cues required;Need further instruction          PT Short Term Goals - 08/01/16 1252      PT SHORT TERM GOAL #1   Title pt will be I with inital HEP (08/09/2016)   Time 3   Period Weeks   Status Achieved     PT SHORT TERM GOAL #2  Title she will be able to verbalize and demo proper posture, walking mechanics and bending/ lifting mechanics to prevent and reduce low back pain (08/09/2016)   Time 3   Period Weeks   Status On-going     PT SHORT TERM GOAL #3   Title pt will demonstrate reduce muscle spasm / tightness in the mid/ low back to reduce pain to </= 8/10 pain and promote trunk mobility (08/09/2016)   Baseline 8/10 consistently, 6/10 least    Time 3   Period Weeks   Status Achieved           PT Long Term Goals - 11/07/16 1605      PT LONG TERM GOAL #1   Title Pt will report at least 25% improvement in vertigo.  12-07-16   Baseline intermittent in occurrence - unable to provoke with positional testing in eval  - 11-06-16   Time 4   Period Weeks   Status New     PT LONG TERM GOAL #2   Title Independent in HEP for vestibular exercises and habituation prn.  11-06-16   Time 4   Period Weeks   Status New     PT LONG TERM GOAL #3   Title Increase FOTO score from 45/100 to >/= 55/100 to demo improvement in vertigo.  11-06-16   Baseline 45/100   Time 4   Period Weeks   Status New               Plan - 11/19/16 1046    Clinical Impression Statement Skilled session focused on vestibular rehab. Unable to elicit the brief sensation of spinning pt reports she had "a few times" over the weekend (bending over; she avoids rolling left and educated need to practice rolling left). Able to elicit dizziness (not spinning) with VOR x1  exercise. Patient educated fully on purpose of vestibular rehab exercises and need to faithfully do them 3x/day.    Rehab Potential Good   PT Frequency 2x / week   PT Duration 4 weeks   PT Treatment/Interventions ADLs/Self Care Home Management;Canalith Repostioning;Stair training;Gait training;Therapeutic activities;Therapeutic exercise;Balance training;Neuromuscular re-education;Patient/family education;Vestibular   PT Next Visit Plan check x1 viewing in sitting and advance to standing; ?add rolling Lt to her habituation HEP; ?add balance on foam with EO and EC for HEP   PT Home Exercise Plan see above   Consulted and Agree with Plan of Care Patient      Patient will benefit from skilled therapeutic intervention in order to improve the following deficits and impairments:  Decreased balance, Impaired sensation, Dizziness, Difficulty walking  Visit Diagnosis: Dizziness and giddiness     Problem List Patient Active Problem List   Diagnosis Date Noted  . Vertigo 10/12/2016  . Neuropathic pain 06/25/2016  . 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 S/P lumbar spinal fusion 01/27/2016  . H/O therapeutic radiation 09/21/2015  . Pes planus of both feet 08/04/2015  . Primary osteoarthritis of right knee 03/10/2015  . Osteoarthritis of right knee 02/04/2015  . Primary osteoarthritis of left knee 09/13/2014  . Hot flashes 01/19/2014  . Abdominal pain, unspecified site 01/19/2014  . Malignant neoplasm of upper-outer quadrant of left breast in female, estrogen receptor positive ([place]) 03/19/2013  . Lumbosacral spondylosis without myelopathy 12/30/2012  . Fibromyalgia syndrome 08/15/2012  . Peripheral neuropathy, toxic 08/15/2012  . Lymphedema of arm - left 04/30/2012    Rexanne Mano, PT 11/19/2016, 10:50 AM  [facility] [address]  [place], [place], [address] Phone: [phone]   Fax:  [phone]  Name: Kelsey Wilson MRN: [id number] Date of Birth: 1961-06-01

## 2016-11-19 NOTE — Patient Instructions (Signed)
Gaze Stabilization: Tip Card  1.Target must remain in focus, not blurry, and appear stationary while head is in motion. 2.Perform exercises with small head movements (45 to either side of midline). 3.Increase speed of head motion so long as target is in focus. 4.If you wear eyeglasses, be sure you can see target through lens (therapist will give specific instructions for bifocal / progressive lenses). 5.These exercises may provoke dizziness or nausea. Work through these symptoms. If too dizzy, slow head movement slightly. Rest between each exercise. 6.Exercises demand concentration; avoid distractions.  Copyright  VHI. All rights reserved.     Special Instructions: Exercises may bring on mild to moderate symptoms of dizziness/spinning that resolve within 30 minutes of completing exercises. If symptoms are lasting longer than 30 minutes, modify your exercises by:  >decreasing the # of times you complete each activity >ensuring your symptoms return to baseline before moving onto the next exercise >dividing up exercises so you do not do them all in one session, but multiple short sessions throughout the day >doing them once a day until symptoms improve   Gaze Stabilization: Sitting    Keeping eyes on target on wall 2-3 feet away, and move head side to side for _30-60___ seconds. Repeat side to side. Then  moving head up and down for __30-60__ seconds. Repeat up and down.  Do __2-3__ sessions per day.  Copyright  VHI. All rights reserved.    Forward Bend    Sitting down at front edge of the seat. Bending at waist and keeping arms straight reach between your legs to the floor and return to sitting up.  Repeat _5__ times. (do 5 in  A row pretty quickly). Stop and focus eyes on an object. When feeling better, repeat 2 more times for a total of 15 forward bends. Do __3_ times per day.  Copyright  VHI. All rights reserved.

## 2016-11-23 ENCOUNTER — Ambulatory Visit: Payer: Medicare Other | Admitting: Physical Therapy

## 2016-11-23 ENCOUNTER — Encounter: Payer: Self-pay | Admitting: Physical Therapy

## 2016-11-23 DIAGNOSIS — R42 Dizziness and giddiness: Secondary | ICD-10-CM

## 2016-11-23 DIAGNOSIS — R262 Difficulty in walking, not elsewhere classified: Secondary | ICD-10-CM

## 2016-11-23 NOTE — Therapy (Signed)
[facility] [address] [place], [place], [address] Phone: [phone]   Fax:  [phone]  Physical Therapy Treatment  Patient Details  Name: Kelsey Wilson MRN: [id number] Date of Birth: 11-15-1960 Referring Provider: Dr. Floyde Parkins  Encounter Date: 11/23/2016      PT End of Session - 11/23/16 1830    Visit Number 3   Number of Visits 9   Date for PT Re-Evaluation 12/07/16   Authorization Type Medicare   Authorization Time Period 11-06-16 - 01-05-17   PT Start Time 1452   PT Stop Time 1535   PT Time Calculation (min) 43 min   Activity Tolerance Patient tolerated treatment well   Behavior During Therapy [facility] for tasks assessed/performed      Past Medical History:  Diagnosis Date  . Anemia   . Anxiety   . Arthritis   . Breast cancer ([place]) 2010   T3N1 invasive ductal carcinoma left breast.Takes Arimidex daily  . Bursitis   . Carpal tunnel syndrome   . Chronic back pain    stenosis  . Constipation    takes Colace daily  . Depression    takes Benzotropine daily  . Diverticulitis of colon   . Fibromyalgia 08/2012  . GERD (gastroesophageal reflux disease)    takes Dexilant daily  . Hemorrhoid   . History of blood transfusion    no abnormal reaction noted  . History of colon polyps    benign  . History of shingles   . Joint pain   . Joint swelling   . Night muscle spasms    takes Flexeril nightly as needed  . Nocturia   . OSA (obstructive sleep apnea)   . OSA on CPAP   . Peripheral edema    takes Furosemide.Just started 01/18/16  . Peripheral neuropathy    takes Lyrica daily  . Pneumonia    hx of-2015  . Seasonal allergies    takes Singulair nightly  . SOB (shortness of breath) on exertion    rarely with exertion  . 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 Alphonzo Grieve)    Past  Surgical History:  Procedure Laterality Date  . ABDOMINAL HYSTERECTOMY     still has ovaries  . APPENDECTOMY    . AXILLARY LYMPH NODE DISSECTION  11/28/2011   Procedure: AXILLARY LYMPH NODE DISSECTION;  Surgeon: Edward Jolly, MD;  Location: [facility];  Service: General;  Laterality: Left;  . BREAST SURGERY Left 2013  . CARPAL TUNNEL RELEASE     Bilateral  . CHOLECYSTECTOMY    . COLONOSCOPY    . EYE SURGERY Left    cataract removal  . KNEE SURGERY     Left Knee  . LUMBAR LAMINECTOMY/DECOMPRESSION MICRODISCECTOMY Bilateral 01/27/2016   Procedure: Laminectomy and Foraminotomy - Lumbar four -Lumbar five - bilateral- on-lay noninstrumented fusion;  Surgeon: Eustace Moore, MD;  Location: [facility];  Service: Neurosurgery;  Laterality: Bilateral;  . MULTIPLE EXTRACTIONS WITH ALVEOLOPLASTY N/A 05/11/2016   Procedure: EXTRACTION OF TEETH EIGHTEEN, TWENTY AND TWENTY- NINE;  REMOVAL OF MANDIBULAR TORUS AND EXOSTOSIS;  Surgeon: Diona Browner, DDS;  Location: [facility];  Service: Oral Surgery;  Laterality: N/A;  . PORTACATH PLACEMENT  06/18/2011   Procedure: INSERTION PORT-A-CATH;  Surgeon: Edward Jolly, MD;  Location: [facility];  Service: General;  Laterality: Right;  right subclavian  .  removal portacath  2014  . spur     Apex spur on both big toes  . TOE SURGERY Bilateral   . TONSILLECTOMY    . TOTAL KNEE ARTHROPLASTY Left 09/13/2014  . TOTAL KNEE ARTHROPLASTY Left 09/13/2014   Procedure: LEFT TOTAL KNEE ARTHROPLASTY;  Surgeon: Rod Can, MD;  Location: [facility];  Service: Orthopedics;  Laterality: Left;  . TOTAL KNEE ARTHROPLASTY Right 03/10/2015   Procedure: RIGHT TOTAL KNEE ARTHROPLASTY;  Surgeon: Rod Can, MD;  Location: [facility];  Service: Orthopedics;  Laterality: Right;    There were no vitals filed for this visit.      Subjective Assessment - 11/23/16 1456    Subjective States no dizzy spells since last here. She did her bending habituation exercises in  sitting and standing. Also no dizziness with seated VOR x1 exercise.    Limitations Sitting;Lifting;Walking;Standing;House hold activities   Patient Stated Goals resolve the vertigo   Currently in Pain? No/denies                          Vestibular Treatment/Exercise - 11/23/16 0001      Vestibular Treatment/Exercise   Vestibular Treatment Provided Gaze   Gaze Exercises X1 Viewing Horizontal;X1 Viewing Vertical     X1 Viewing Horizontal   Foot Position standing   Time --  30, with sway   Comments +mild dizziness after     X1 Viewing Vertical   Foot Position standing   Time --  20 sec, incr sway, diplopia            Balance Exercises - 11/23/16 1704      Balance Exercises: Standing   Standing Eyes Opened Wide (BOA);Head turns;Foam/compliant surface;Narrow base of support (BOS)  apart, together, partial tandem   Standing Eyes Closed Wide (BOA);Foam/compliant surface   Heel Raises Limitations very light UE support, x10   Toe Raise Limitations very light UE support, x10           PT Education - 11/23/16 1829    Education provided Yes   Education Details progression of VOR x 1 to standing; additions to HEP for balance and how to progress these exercises; PT POC   Person(s) Educated Patient   Methods Explanation;Demonstration;Verbal cues;Handout   Comprehension Verbalized understanding;Returned demonstration;Verbal cues required             PT Long Term Goals - 11/23/16 1937      PT LONG TERM GOAL #1   Title Pt will report at least 25% improvement in vertigo.  12-07-16   Baseline intermittent in occurrence - unable to provoke with positional testing in eval  - 11-06-16; 11/23/16 has had no vertigo since last appt   Time 4   Period Weeks   Status Achieved     PT LONG TERM GOAL #2   Title Independent in HEP for vestibular exercises and habituation prn.     Baseline 6/22 met   Time 4   Period Weeks   Status Achieved     PT LONG TERM GOAL  #3   Title Increase FOTO score from 45/100 to >/= 55/100 to demo improvement in vertigo.  11-06-16   Baseline 45/100   Time 4   Period Weeks   Status Unable to assess  decision to cancel remaining appts made in last 5 minutes of session and did not have time to do ROTO     PT LONG TERM GOAL #4   Title --  PT LONG TERM GOAL #5   Title --               Plan - 11/23/16 1832    Clinical Impression Statement Session focused on review and advancing patient's vestibular HEP. Again unable to elicit the spinning sensation, she did however demonstrate impaired VOR when doing VORx1 in standing (vertical caused dysconjugate eye movements and diplopia). At end of session, pt felt she did not immediately need further PT and would continue with her HEP on her own. Offered to cancel all but her last appointment (7/13) and she could be seen if she was still having difficulty. Will place patient on hold for 30 days and if she does not return to clinic, will complete discharge at that time.    Rehab Potential Good   PT Frequency 2x / week   PT Duration 4 weeks   PT Treatment/Interventions ADLs/Self Care Home Management;Canalith Repostioning;Stair training;Gait training;Therapeutic activities;Therapeutic exercise;Balance training;Neuromuscular re-education;Patient/family education;Vestibular   PT Next Visit Plan if returns to clinic within 30 days, reassess vestibular system; if does not return, will discharge   PT Home Exercise Plan see above   Consulted and Agree with Plan of Care Patient      Patient will benefit from skilled therapeutic intervention in order to improve the following deficits and impairments:  Decreased balance, Impaired sensation, Dizziness, Difficulty walking  Visit Diagnosis: Dizziness and giddiness  Difficulty in walking, not elsewhere classified     Problem List Patient Active Problem List   Diagnosis Date Noted  . Vertigo 10/12/2016  . Neuropathic pain  06/25/2016  . 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 S/P lumbar spinal fusion 01/27/2016  . H/O therapeutic radiation 09/21/2015  . Pes planus of both feet 08/04/2015  . Primary osteoarthritis of right knee 03/10/2015  . Osteoarthritis of right knee 02/04/2015  . Primary osteoarthritis of left knee 09/13/2014  . Hot flashes 01/19/2014  . Abdominal pain, unspecified site 01/19/2014  . Malignant neoplasm of upper-outer quadrant of left breast in female, estrogen receptor positive ([place]) 03/19/2013  . Lumbosacral spondylosis without myelopathy 12/30/2012  . Fibromyalgia syndrome 08/15/2012  . Peripheral neuropathy, toxic 08/15/2012  . Lymphedema of arm - left 04/30/2012    Rexanne Mano, PT 11/23/2016, 7:42 PM  [facility] [address] [place], [place], [address] Phone: [phone]   Fax:  [phone]  Name: Kelsey Wilson MRN: [id number] Date of Birth: 1961-02-20

## 2016-11-23 NOTE — Patient Instructions (Signed)
Gaze Stabilization: Tip Card  1.Target must remain in focus, not blurry, and appear stationary while head is in motion. 2.Perform exercises with small head movements (45 to either side of midline). 3.Increase speed of head motion so long as target is in focus. 4.If you wear eyeglasses, be sure you can see target through lens (therapist will give specific instructions for bifocal / progressive lenses). 5.These exercises may provoke dizziness or nausea. Work through these symptoms. If too dizzy, slow head movement slightly. Rest between each exercise. 6.Exercises demand concentration; avoid distractions.  Copyright  VHI. All rights reserved.     Special Instructions: Exercises may bring on mild to moderate symptoms of dizziness that resolve within 30 minutes of completing exercises. If symptoms are lasting longer than 30 minutes, modify your exercises by:  >decreasing the # of times you complete each activity >ensuring your symptoms return to baseline before moving onto the next exercise >dividing up exercises so you do not do them all in one session, but multiple short sessions throughout the day >doing them once a day until symptoms improve  Gaze Stabilization: Tip Card  1.Target must remain in focus, not blurry, and appear stationary while head is in motion. 2.Perform exercises with small head movements (45 to either side of midline). 3.Increase speed of head motion so long as target is in focus. 4.If you wear eyeglasses, be sure you can see target through lens (therapist will give specific instructions for bifocal / progressive lenses). 5.These exercises may provoke dizziness or nausea. Work through these symptoms. If too dizzy, slow head movement slightly. Rest between each exercise. 6.Exercises demand concentration; avoid distractions.  Copyright  VHI. All rights reserved.     Special Instructions: Exercises may bring on mild to moderate symptoms of dizziness that resolve  within 30 minutes of completing exercises. If symptoms are lasting longer than 30 minutes, modify your exercises by:  >decreasing the # of times you complete each activity >ensuring your symptoms return to baseline before moving onto the next exercise >dividing up exercises so you do not do them all in one session, but multiple short sessions throughout the day >doing them once a day until symptoms improve    For safety, perform standing exercises close to a counter, wall, of corner with a chair back in front of you in case you lose your balance.    Gaze Stabilization - Standing Feet Apart   Feet shoulder width apart, keeping eyes on target on wall 3 feet away, tilt head down slightly and move head side to side for 30 seconds. Repeat while moving head up and down for 30 seconds. Repeat 3 times. *Work up to tolerating 60 seconds, as able. Do 2-3 sessions per day.    Feet Apart (Compliant Surface) Head Motion - Eyes Closed    Stand on foam with feet shoulder width apart. Close eyes and hold your balance for 30 seconds. Repeat 3 times per session. Do 1 sessions per day.  Copyright  VHI. All rights reserved.  Feet Together (Compliant Surface) Head Motion - Eyes Closed    Stand on foam with feet together. Close eyes and hold your balance for 30 seconds. Repeat 3 times per session. Do 1 sessions per day.  Copyright  VHI. All rights reserved.  Feet Partial Heel-Toe (Compliant Surface) Head Motion - Eyes Closed    Stand on foam with one foot slightly in front of the other. Close eyes and hold your balance for 30 seconds. Repeat 3 times per session.  Do 1 sessions per day

## 2016-11-27 ENCOUNTER — Encounter: Payer: Medicare Other | Admitting: Physical Therapy

## 2016-11-30 ENCOUNTER — Encounter: Payer: Medicare Other | Admitting: Physical Therapy

## 2016-12-03 ENCOUNTER — Encounter: Payer: Medicare Other | Admitting: Physical Therapy

## 2016-12-07 ENCOUNTER — Encounter: Payer: Medicare Other | Admitting: Physical Therapy

## 2016-12-10 ENCOUNTER — Encounter: Payer: Medicare Other | Admitting: Physical Therapy

## 2016-12-11 MED FILL — LYRICA 100 MG CAPSULE: 100 | 30 days supply | Qty: 90 | Fill #2

## 2016-12-11 MED FILL — ANASTROZOLE 1 MG TABLET: 1 | 30 days supply | Qty: 30 | Fill #1

## 2016-12-14 ENCOUNTER — Encounter: Payer: Medicare Other | Admitting: Physical Therapy

## 2016-12-18 ENCOUNTER — Ambulatory Visit (INDEPENDENT_AMBULATORY_CARE_PROVIDER_SITE_OTHER): Payer: Medicare Other | Admitting: Podiatry

## 2016-12-18 ENCOUNTER — Ambulatory Visit (INDEPENDENT_AMBULATORY_CARE_PROVIDER_SITE_OTHER): Payer: Medicare Other

## 2016-12-18 DIAGNOSIS — S93602A Unspecified sprain of left foot, initial encounter: Secondary | ICD-10-CM

## 2016-12-18 DIAGNOSIS — M79675 Pain in left toe(s): Secondary | ICD-10-CM | POA: Diagnosis not present

## 2016-12-18 DIAGNOSIS — R52 Pain, unspecified: Secondary | ICD-10-CM

## 2016-12-18 DIAGNOSIS — B351 Tinea unguium: Secondary | ICD-10-CM

## 2016-12-18 DIAGNOSIS — M79674 Pain in right toe(s): Secondary | ICD-10-CM

## 2016-12-18 NOTE — Progress Notes (Signed)
Subjective:     Patient ID: Kelsey Wilson, female   DOB: 07-25-1960, 56 y.o.   MRN: [id number]  HPIThis patient returns for nail care both feet.  Patient says the nails are painful walking and wearing her shoes.  She also says she feel near her bed last night and injured her left foot.  She says she has pain walking and placing her foot on the ground.  She requests an evaluation of her painful left foot also.   Review of Systems     Objective:   Physical Exam GENERAL APPEARANCE: Alert, conversant. Appropriately groomed. No acute distress.  VASCULAR: Pedal pulses palpable at 2/4 DP and PT bilateral.  Capillary refill time is immediate to all digits,  Proximal to distal cooling it warm to warm.  Digital hair growth is present bilateral  NEUROLOGIC: sensation is intact epicritically and protectively to 5.07 monofilament at 5/5 sites bilateral.  Light touch is intact bilateral, vibratory sensation intact bilateral, achilles tendon reflex is intact bilateral.  MUSCULOSKELETAL: acceptable muscle strength, tone and stability bilateral.  There is redness and swelling and pain on palpation 1st MPJ left foot.  Pain is elicited on lateral  Palpation 1st MPJ left  DERMATOLOGIC: skin color, texture, and turgor are within normal limits.  No preulcerative lesions or ulcers  are seen, no interdigital maceration noted.  No open lesions present.  . No drainage noted. NAILS  Thick disfigured discolored nails both feet x 10      Assessment:     Onychomycosis  Foot Sprain 1st MPJ left foot.    Plan:  Debridement of onychomycotic nails.  Xrays reveal no evidence of displaced bone big toe joint left foot but there is bone noted at the lateral aspect left 1st MPJ .  Patient has unna boot applied and given surgical shoe.  She is to take Tramadol for pain control which she has a t home.  She is to return 7-10 days for unna boot removal.   Gardiner Barefoot DPM

## 2016-12-24 ENCOUNTER — Ambulatory Visit: Payer: Medicare Other | Admitting: Physical Medicine & Rehabilitation

## 2016-12-28 ENCOUNTER — Ambulatory Visit (INDEPENDENT_AMBULATORY_CARE_PROVIDER_SITE_OTHER): Payer: Medicare Other | Admitting: Podiatry

## 2016-12-28 ENCOUNTER — Encounter: Payer: Self-pay | Admitting: Podiatry

## 2016-12-28 VITALS — BP 126/72 | HR 79 | Temp 99.1°F

## 2016-12-28 DIAGNOSIS — R52 Pain, unspecified: Secondary | ICD-10-CM

## 2016-12-28 DIAGNOSIS — S93602A Unspecified sprain of left foot, initial encounter: Secondary | ICD-10-CM | POA: Diagnosis not present

## 2016-12-28 NOTE — Progress Notes (Signed)
Subjective:     Patient ID: Kelsey Wilson, female   DOB: 13-Mar-1961, 56 y.o.   MRN: [id number]  HPIThis patient returns for Evaluation of her left foot due to a foot sprain in her big toe joint.  She was treated initially with an Haematologist and a surgical shoe for week.  She returns the office today in the [place] is removed.  She says she feels pain in the big toe joint of the left foot which is approximately 7 she presents the office for continued evaluation and treatment of this big toe joint of 10.  Her redness and her swelling have all diminished.     Review of Systems     Objective:   Physical Exam GENERAL APPEARANCE: Alert, conversant. Appropriately groomed. No acute distress.  VASCULAR: Pedal pulses palpable at 2/4 DP and PT bilateral.  Capillary refill time is immediate to all digits,  Proximal to distal cooling it warm to warm.  Digital hair growth is present bilateral  NEUROLOGIC: sensation is intact epicritically and protectively to 5.07 monofilament at 5/5 sites bilateral.  Light touch is intact bilateral, vibratory sensation intact bilateral, achilles tendon reflex is intact bilateral.  MUSCULOSKELETAL: acceptable muscle strength, tone and stability bilateral.  There is Arthritic changes noted on the dorsal aspect of the first MPJ left foot.  No redness or swelling persisting.  Limited range of motion.  DERMATOLOGIC: skin color, texture, and turgor are within normal limits.  No preulcerative lesions or ulcers  are seen, no interdigital maceration noted.  No open lesions present.  . No drainage noted. NAILS  Thick disfigured discolored nails both feet x 10      Assessment:      Foot Sprain 1st MPJ left foot.    Plan:  Rov.  Removal of the [employer].  Left big toe was examined and there is no redness or swelling noted.  Limited range of motion noted.  Discussed treatment and patient it does not want to receive an injection in her foot.  She is allergic to many  anti-inflammatories.  Therefore, I told her she needs to continue to ambulate with the surgical shoe and allow her foot to improve.  Use Voltaren gel as needed  . Return to clinic in 2 weeks for further evaluation and treatment   Gardiner Barefoot DPM

## 2017-01-01 ENCOUNTER — Ambulatory Visit (HOSPITAL_BASED_OUTPATIENT_CLINIC_OR_DEPARTMENT_OTHER): Payer: Medicare Other | Admitting: Adult Health

## 2017-01-01 ENCOUNTER — Encounter: Payer: Self-pay | Admitting: Adult Health

## 2017-01-01 ENCOUNTER — Ambulatory Visit: Payer: Medicare Other | Admitting: Podiatry

## 2017-01-01 VITALS — BP 109/58 | HR 77 | Temp 98.1°F | Resp 17 | Ht 63.0 in | Wt 250.4 lb

## 2017-01-01 DIAGNOSIS — G620 Drug-induced polyneuropathy: Secondary | ICD-10-CM

## 2017-01-01 DIAGNOSIS — C50412 Malignant neoplasm of upper-outer quadrant of left female breast: Secondary | ICD-10-CM | POA: Diagnosis present

## 2017-01-01 DIAGNOSIS — N941 Unspecified dyspareunia: Secondary | ICD-10-CM

## 2017-01-01 DIAGNOSIS — N898 Other specified noninflammatory disorders of vagina: Secondary | ICD-10-CM

## 2017-01-01 DIAGNOSIS — Z170 Estrogen receptor positive status [ER+]: Secondary | ICD-10-CM

## 2017-01-01 NOTE — Progress Notes (Signed)
CLINIC:  Survivorship   REASON FOR VISIT:  Routine follow-up post-treatment for a recent history of breast cancer.  BRIEF ONCOLOGIC HISTORY:    Malignant neoplasm of upper-outer quadrant of left breast in female, estrogen receptor positive ([place])   05/2011 Initial Diagnosis    Malignant neoplasm of upper-outer quadrant of left breast in female, estrogen receptor positive ([place])      06/01/2011 Initial Biopsy    Left breast biopsy: IDC, grade 3, ER+(8%), PR+(9%), Ki-67 100%, HER-2 negative (ratio 1.54), 1 lymph node positive      07/10/2011 - 08/30/2011 Neo-Adjuvant Chemotherapy    4 cycles of Cyclophosphamide, Epirubicin and Fluorouracil      09/20/2011 - 11/01/2011 Neo-Adjuvant Chemotherapy    4 cycles of Docetaxel      11/28/2011 Surgery    Left breast lumpectomy and ALND: No residual carcinoma, 17 LN negative      02/11/2012 - 03/31/2012 Radiation Therapy    Adjuvant Radiation Isidore Moos): Left breast / 45 Gray @ 1.8 Pearline Cables per fraction x 25 fractions Left Supraclavicular fossa / 34 Gray _0 .8 Gray per fraction x 25 fractions Left breast boost / 16 Gray at [employer] per fraction x 5 fractions      04/2012 -  Anti-estrogen oral therapy    Anastrozole daily       INTERVAL HISTORY:  Kelsey Wilson presents to the [facility] today for our initial meeting to review her survivorship care plan detailing her treatment course for breast cancer, as well as monitoring long-term side effects of that treatment, education regarding health maintenance, screening, and overall wellness and health promotion.     Overall, Kelsey Wilson reports feeling quite well.  She continues to have peripheral neuropathy.  She says that it is in her fingertips and toes and effects her ability to do things with her fingers, balance during certain activities, and she is unable to work due to this.  She has struggled with lymphedema in the past.  She wears a sleeve and this controls it well.  She also is experiencing  vaginal dryness and dyspareunia whenever she has any sexual activity.    She is taking Anastrozole daily and is tolerating it well.  She denies joint aches, significant hot flashes, or any other concerns.      REVIEW OF SYSTEMS:  Review of Systems  Constitutional: Negative for appetite change, chills, fatigue, fever and unexpected weight change.  HENT:   Negative for hearing loss and lump/mass.   Eyes: Negative for eye problems and icterus.  Respiratory: Negative for chest tightness, cough and shortness of breath.   Cardiovascular: Negative for chest pain, leg swelling and palpitations.  Gastrointestinal: Negative for abdominal distention and abdominal pain.  Endocrine: Positive for hot flashes.  Genitourinary: Negative for difficulty urinating.   Musculoskeletal: Negative for arthralgias.  Skin: Negative for itching and rash.  Neurological: Negative for dizziness, extremity weakness, headaches and numbness.  Hematological: Negative for adenopathy. Does not bruise/bleed easily.  Psychiatric/Behavioral: Negative for depression. The patient is not nervous/anxious.   Breast: Denies any new nodularity, masses, tenderness, nipple changes, or nipple discharge.      ONCOLOGY TREATMENT TEAM:  1. Surgeon:  Dr. Excell Seltzer at [facility] 2. Medical Oncologist: Dr. Jana Hakim  3. Radiation Oncologist: Dr. Isidore Moos    PAST MEDICAL/SURGICAL HISTORY:  Past Medical History:  Diagnosis Date  . Anemia   . Anxiety   . Arthritis   . Breast cancer ([place]) 2010   T3N1 invasive ductal carcinoma left breast.Takes  Arimidex daily  . Bursitis   . Carpal tunnel syndrome   . Chronic back pain    stenosis  . Constipation    takes Colace daily  . Depression    takes Benzotropine daily  . Diverticulitis of colon   . Fibromyalgia 08/2012  . GERD (gastroesophageal reflux disease)    takes Dexilant daily  . Hemorrhoid   . History of blood transfusion    no abnormal reaction noted  . History  of colon polyps    benign  . History of shingles   . Joint pain   . Joint swelling   . Night muscle spasms    takes Flexeril nightly as needed  . Nocturia   . OSA (obstructive sleep apnea)   . OSA on CPAP   . Peripheral edema    takes Furosemide.Just started 01/18/16  . Peripheral neuropathy    takes Lyrica daily  . Pneumonia    hx of-2015  . Seasonal allergies    takes Singulair nightly  . SOB (shortness of breath) on exertion    rarely with exertion  . 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 Alphonzo Grieve)   Past Surgical History:  Procedure Laterality Date  . ABDOMINAL HYSTERECTOMY     still has ovaries  . APPENDECTOMY    . AXILLARY LYMPH NODE DISSECTION  11/28/2011   Procedure: AXILLARY LYMPH NODE DISSECTION;  Surgeon: Edward Jolly, MD;  Location: [facility];  Service: General;  Laterality: Left;  . BREAST SURGERY Left 2013  . CARPAL TUNNEL RELEASE     Bilateral  . CHOLECYSTECTOMY    . COLONOSCOPY    . EYE SURGERY Left    cataract removal  . KNEE SURGERY     Left Knee  . LUMBAR LAMINECTOMY/DECOMPRESSION MICRODISCECTOMY Bilateral 01/27/2016   Procedure: Laminectomy and Foraminotomy - Lumbar four -Lumbar five - bilateral- on-lay noninstrumented fusion;  Surgeon: Eustace Moore, MD;  Location: [facility];  Service: Neurosurgery;  Laterality: Bilateral;  . MULTIPLE EXTRACTIONS WITH ALVEOLOPLASTY N/A 05/11/2016   Procedure: EXTRACTION OF TEETH EIGHTEEN, TWENTY AND TWENTY- NINE;  REMOVAL OF MANDIBULAR TORUS AND EXOSTOSIS;  Surgeon: Diona Browner, DDS;  Location: [facility];  Service: Oral Surgery;  Laterality: N/A;  . PORTACATH PLACEMENT  06/18/2011   Procedure: INSERTION PORT-A-CATH;  Surgeon: Edward Jolly, MD;  Location: [facility];  Service: General;  Laterality: Right;  right subclavian  . removal portacath  2014  . spur     Apex spur on both  big toes  . TOE SURGERY Bilateral   . TONSILLECTOMY    . TOTAL KNEE ARTHROPLASTY Left 09/13/2014  . TOTAL KNEE ARTHROPLASTY Left 09/13/2014   Procedure: LEFT TOTAL KNEE ARTHROPLASTY;  Surgeon: Rod Can, MD;  Location: [facility];  Service: Orthopedics;  Laterality: Left;  . TOTAL KNEE ARTHROPLASTY Right 03/10/2015   Procedure: RIGHT TOTAL KNEE ARTHROPLASTY;  Surgeon: Rod Can, MD;  Location: [facility];  Service: Orthopedics;  Laterality: Right;     ALLERGIES:  Allergies  Allergen Reactions  . Oxycodone Other (See Comments)    hallucinations  . Aleve [Naproxen] Nausea Only  . Compazine [Prochlorperazine] Other (See Comments)    Numbness of face and  lips  . Penicillins Nausea Only    Has patient had a PCN reaction causing immediate rash, facial/tongue/throat swelling, SOB or lightheadedness with hypotension: No Has patient had a PCN reaction  causing severe rash involving mucus membranes or skin necrosis: No Has patient had a PCN reaction that required hospitalization No Has patient had a PCN reaction occurring within the last 10 years: No If all of the above answers are "NO", then may proceed with Cephalosporin use.      CURRENT MEDICATIONS:  Outpatient Encounter Prescriptions as of 01/01/2017  Medication Sig Note  . anastrozole (ARIMIDEX) 1 MG tablet Take 1 mg by mouth daily.   Marland Kitchen anastrozole (ARIMIDEX) 1 MG tablet TAKE 1 TABLET BY MOUTH ONCE DAILY   . benzonatate (TESSALON) 100 MG capsule Take by mouth 3 (three) times daily as needed for cough.   . benztropine (COGENTIN) 0.5 MG tablet Take 0.5 mg by mouth 2 (two) times daily.   . cholecalciferol (VITAMIN D) 1000 units tablet Take 1,000 Units by mouth daily. Takes 4,000 units daily   . clindamycin (CLEOCIN) 300 MG capsule Take 600 mg by mouth once. Takes 2 hours before dental procedures 05/04/2016: Received from: External Pharmacy Received Sig: take 1 capsule by mouth three times a day  . cyclobenzaprine (FLEXERIL) 10 MG tablet  Take 10 mg by mouth 2 (two) times daily. 05/04/2016: Received from: External Pharmacy Received Sig: take 1 tablet by mouth twice a day  . Dexlansoprazole (DEXILANT) 30 MG capsule Take 30 mg by mouth daily.   . diclofenac sodium (VOLTAREN) 1 % GEL Apply 2 g topically 4 (four) times daily.   Marland Kitchen docusate sodium (COLACE) 100 MG capsule Take 1 capsule (100 mg total) by mouth every 12 (twelve) hours.   . DULoxetine (CYMBALTA) 60 MG capsule take 1 capsule by mouth once daily   . furosemide (LASIX) 40 MG tablet Take 40 mg by mouth.   . metolazone (ZAROXOLYN) 2.5 MG tablet Take 2.5 mg by mouth daily. 05/04/2016: Received from: External Pharmacy Received Sig: take 1 tablet by mouth once daily  . montelukast (SINGULAIR) 10 MG tablet Take 10 mg by mouth at bedtime.   . potassium chloride SA (K-DUR,KLOR-CON) 20 MEQ tablet Take 20 mEq by mouth daily.   . pregabalin (LYRICA) 100 MG capsule Take 1 capsule (100 mg total) by mouth 3 (three) times daily.   . promethazine (PHENERGAN) 25 MG tablet Take 25 mg by mouth every 4 (four) hours as needed for nausea or vomiting.   Marland Kitchen tiZANidine (ZANAFLEX) 4 MG tablet Take 4 mg by mouth every 6 (six) hours as needed for muscle spasms.   . traMADol (ULTRAM) 50 MG tablet Take 2 tablets (100 mg total) by mouth 3 (three) times daily as needed for moderate pain.   . dabigatran (PRADAXA) 150 MG CAPS capsule Take 150 mg by mouth 2 (two) times daily.    No facility-administered encounter medications on file as of 01/01/2017.      ONCOLOGIC FAMILY HISTORY:  Family History  Problem Relation Age of Onset  . Hypertension Mother   . Diabetes Mother   . Hypertension Father   . Diabetes Father   . Cancer Paternal Grandmother        unknown  . Colon cancer Neg Hx      GENETIC COUNSELING/TESTING: Not at this time  SOCIAL HISTORY:  ERON GOBLE is single and lives with her daughter and grandchildren in [place], [place].  She has 3 children and they live in [place].   Ms. Starke is currently on disability.  She denies any current or history of tobacco, alcohol, or illicit drug use.     PHYSICAL EXAMINATION:  Vital  Signs:   Vitals:   01/01/17 1003  BP: (!) 109/58  Pulse: 77  Resp: 17  Temp: 98.1 F (36.7 C)   Filed Weights   01/01/17 1003  Weight: 250 lb 6.4 oz (113.6 kg)   General: Well-nourished, well-appearing female in no acute distress.  She is unaccompanied today.   HEENT: Head is normocephalic.  Pupils equal and reactive to light. Conjunctivae clear without exudate.  Sclerae anicteric. Oral mucosa is pink, moist.  Oropharynx is pink without lesions or erythema.  Lymph: No cervical, supraclavicular, or infraclavicular lymphadenopathy noted on palpation.  Cardiovascular: Regular rate and rhythm.Marland Kitchen Respiratory: Clear to auscultation bilaterally. Chest expansion symmetric; breathing non-labored.  GI: Abdomen soft and round; non-tender, non-distended. Bowel sounds normoactive.  GU: Deferred.  Neuro: No focal deficits. Steady gait.  Psych: Mood and affect normal and appropriate for situation.  Extremities: No edema. MSK: No focal spinal tenderness to palpation.  Full range of motion in bilateral upper extremities Skin: Warm and dry.  LABORATORY DATA:  None for this visit.  DIAGNOSTIC IMAGING:  None for this visit.      ASSESSMENT AND PLAN:  Ms.. Wilson is a pleasant 56 y.o. 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 She presents to the Survivorship Clinic for our initial meeting and routine follow-up post-completion of treatment for breast cancer.    1. Stage IIIA left breast cancer:  Kelsey Wilson is continuing to recover from definitive treatment for breast cancer. She will follow-up with me in October, 2018 with history and physical exam per  surveillance protocol.  She will continue her anti-estrogen therapy with Anastrozole. Thus far, she is tolerating the Anastrozole well, with minimal side effects (mentioned below). Today, a comprehensive survivorship care plan and treatment summary was reviewed with the patient today detailing her breast cancer diagnosis, treatment course, potential late/long-term effects of treatment, appropriate follow-up care with recommendations for the future, and patient education resources.  A copy of this summary, along with a letter will be sent to the patient's primary care provider via mail/fax/In Basket message after today's visit.    2. Peripheral neuropathy: Kelsey Wilson is managing this with tramadol and lyrica.  She wants to know if this will improve, and I honestly don't know that it will considering she still has it this long out.    3.  Vaginal dryness and dyspareunia: We reviewed using a vaginal moisturizer, lubrication, and a potential referral to pelvic floor physical therapy.    4. Lymphedema: This is under control.  She has no sign of lymphedema today.  She was given detailed information about lymphedema in her survivorship care plan.    5. Bone health:  Given Kelsey Wilson's age/history of breast cancer and her current treatment regimen including anti-estrogen therapy with Anastrozole, she is at risk for bone demineralization.  Her last DEXA scan was 05/2016 and was consistent with osteopenia, with a T score of -1.3 in her right femur and -1.2 in her spine.  In the meantime, she was encouraged to increase her consumption of foods rich in calcium, as well as increase her weight-bearing activities.  She was given education on specific activities to promote bone health.  6. Cancer screening:  Due to Kelsey Wilson's history and her age, she should receive screening for skin cancers, colon cancer, and gynecologic cancers.  The information and recommendations are listed on the patient's  comprehensive care  plan/treatment summary and were reviewed in detail with the patient.    7. Health maintenance and wellness promotion: Kelsey Wilson was encouraged to consume 5-7 servings of fruits and vegetables per day. We reviewed the "Nutrition Rainbow" handout, as well as the handout "Take Control of Your Health and Reduce Your Cancer Risk" from the [facility].  She was also encouraged to engage in moderate to vigorous exercise for 30 minutes per day most days of the week. We discussed the [employer] fitness program, which is designed for cancer survivors to help them become more physically fit after cancer treatments.  She was instructed to limit her alcohol consumption and continue to abstain from tobacco use.     8. Support services/counseling: It is not uncommon for this period of the patient's cancer care trajectory to be one of many emotions and stressors.  We discussed an opportunity for her to participate in the next session of [facility] ("Finding Your New Normal") support group series designed for patients after they have completed treatment.   Kelsey Wilson was encouraged to take advantage of our many other support services programs, support groups, and/or counseling in coping with her new life as a cancer survivor after completing anti-cancer treatment.  She was offered support today through active listening and expressive supportive counseling.  She was given information regarding our available services and encouraged to contact me with any questions or for help enrolling in any of our support group/programs.    Dispo:   -Return to cancer center in October, 2018 for follow up with me. -She is welcome to return back to the Survivorship Clinic at any time; no additional follow-up needed at this time.  -Consider referral back to survivorship as a long-term survivor for continued surveillance  A total of (45) minutes of face-to-face time was spent with this patient with greater than 50% of that time in  counseling and care-coordination.   Gardenia Phlegm, NP Survivorship Program [facility] [phone]   Note: PRIMARY CARE PROVIDER Javier Docker, [place] [phone]

## 2017-01-08 ENCOUNTER — Other Ambulatory Visit: Payer: Self-pay | Admitting: Physical Medicine & Rehabilitation

## 2017-01-08 MED FILL — ANASTROZOLE 1 MG TABLET: 1 | 30 days supply | Qty: 30 | Fill #2

## 2017-01-08 MED FILL — LYRICA 100 MG CAPSULE: 100 | 30 days supply | Qty: 90 | Fill #3

## 2017-01-09 MED FILL — traMADol HCL 50 MG TABS: 50 | 30 days supply | Qty: 180 | Fill #0

## 2017-01-16 ENCOUNTER — Ambulatory Visit (INDEPENDENT_AMBULATORY_CARE_PROVIDER_SITE_OTHER): Payer: Medicare Other | Admitting: Podiatry

## 2017-01-16 ENCOUNTER — Encounter: Payer: Self-pay | Admitting: Podiatry

## 2017-01-16 DIAGNOSIS — B351 Tinea unguium: Secondary | ICD-10-CM | POA: Diagnosis not present

## 2017-01-16 DIAGNOSIS — M79675 Pain in left toe(s): Secondary | ICD-10-CM | POA: Diagnosis not present

## 2017-01-16 DIAGNOSIS — M79674 Pain in right toe(s): Secondary | ICD-10-CM

## 2017-01-16 DIAGNOSIS — S93602A Unspecified sprain of left foot, initial encounter: Secondary | ICD-10-CM

## 2017-01-16 DIAGNOSIS — M129 Arthropathy, unspecified: Secondary | ICD-10-CM

## 2017-01-16 NOTE — Progress Notes (Signed)
Subjective:     Patient ID: Kelsey Wilson, female   DOB: [birth year], 56 y.o.   MRN: [id number]  HPIThis patient returns for nail care both feet.  Patient says the nails are painful walking and wearing her shoes.  She says she is concerned about cyst on the top of her left foot.   She also says her big toe joint left foot is healing.   She presents for preventive foot care services.   Review of Systems     Objective:   Physical Exam GENERAL APPEARANCE: Alert, conversant. Appropriately groomed. No acute distress.  VASCULAR: Pedal pulses palpable at 2/4 DP and PT bilateral.  Capillary refill time is immediate to all digits,  Proximal to distal cooling it warm to warm.  Digital hair growth is present bilateral  NEUROLOGIC: sensation is intact epicritically and protectively to 5.07 monofilament at 5/5 sites bilateral.  Light touch is intact bilateral, vibratory sensation intact bilateral, achilles tendon reflex is intact bilateral.  MUSCULOSKELETAL: acceptable muscle strength, tone and stability bilateral.  Intrinsic muscluature intact bilateral.  HAV  B/L.  S/P foot sprain.  Arthritis liz-Frank joint.  DERMATOLOGIC: skin color, texture, and turgor are within normal limits.  No preulcerative lesions or ulcers  are seen, no interdigital maceration noted.  No open lesions present.  . No drainage noted. NAILS  Thick disfigured discolored nails both feet x 10      Assessment:     Onychomycosis    Plan:  Debridement of onychomycotic nails.  RTC  3 months. Discussed her left foot arthritis and previously injured big toe joint left foot. Gardiner Barefoot DPM

## 2017-02-12 ENCOUNTER — Encounter: Payer: Self-pay | Admitting: Adult Health

## 2017-02-12 ENCOUNTER — Ambulatory Visit (INDEPENDENT_AMBULATORY_CARE_PROVIDER_SITE_OTHER): Payer: Medicare Other | Admitting: Adult Health

## 2017-02-12 VITALS — BP 126/82 | HR 76 | Wt 260.4 lb

## 2017-02-12 DIAGNOSIS — R42 Dizziness and giddiness: Secondary | ICD-10-CM

## 2017-02-12 NOTE — Progress Notes (Signed)
PATIENT: Kelsey Wilson DOB: Aug 16, 1960  REASON FOR VISIT: follow up- vertigo HISTORY FROM: patient  HISTORY OF PRESENT ILLNESS: Kelsey Wilson is a 56 year old female with a history of vertigo. She returns today for follow-up. She states that she is doing well after vestibular rehabilitation. She states that she continues to do the exercises at home. She has approximately one episode of vertigo a week but this only last for seconds. She reports that she is very pleased with physical therapy. She denies any new neurological symptoms. She returns today for an evaluation.  HISTORY 10/12/16: Kelsey Wilson is a 56 year old right-handed black female with a history of fibromyalgia and obesity. The patient indicates that she had onset of vertigo that began about 2 months ago. The patient noted the episode initially when she rolled to the left side while in bed, she developed vertigo with a spinning sensation. The patient has noted vertigo off and on since that time that generally occurs when she looks up or looks down. The episodes may last several minutes and then clear. The patient has some chronic issues with reflux and she may have some nausea, but she does not have significant nausea and vomiting with vertigo. She has reported some trouble swallowing, she has had a recent swallowing study in February 2018 that showed some oral pharyngeal dysphagia. The patient reports some occasional double vision, no droopy eyelid problems. The patient has not had slurred speech or numbness or weakness of the extremities. She does report some occasional headaches in the frontotemporal areas. She denies any syncope, she has some problems with urinary urgency and occasional incontinence. She was seen by Dr. Redmond Baseman from ENT, MRI of the brain was done, this reveals evidence of minimal white matter changes. No acute changes are seen, there is no evidence of brainstem involvement. The patient is sent to this office for an evaluation.  The patient reports no medication changes around the time of onset of symptoms. She recently was placed on CPAP for sleep apnea. The patient does report some chronic ear pain over the last 4 months bilaterally. No change in hearing has been noted.   REVIEW OF SYSTEMS: Out of a complete 14 system review of symptoms, the patient complains only of the following symptoms, and all other reviewed systems are negative.  Appetite change, fatigue, etc. to weight change, excessive sweating, nausea, frequent waking, daytime sleepiness, snoring, leg swelling, nausea, excessive eating, joint pain, joint swelling, back pain, aching muscles, depression, nervous/anxious, numbness  ALLERGIES: Allergies  Allergen Reactions  . Oxycodone Other (See Comments)    hallucinations  . Aleve [Naproxen] Nausea Only  . Compazine [Prochlorperazine] Other (See Comments)    Numbness of face and  lips  . Penicillins Nausea Only    Has patient had a PCN reaction causing immediate rash, facial/tongue/throat swelling, SOB or lightheadedness with hypotension: No Has patient had a PCN reaction causing severe rash involving mucus membranes or skin necrosis: No Has patient had a PCN reaction that required hospitalization No Has patient had a PCN reaction occurring within the last 10 years: No If all of the above answers are "NO", then may proceed with Cephalosporin use.     HOME MEDICATIONS: Outpatient Medications Prior to Visit  Medication Sig Dispense Refill  . anastrozole (ARIMIDEX) 1 MG tablet TAKE 1 TABLET BY MOUTH ONCE DAILY 30 tablet 3  . benzonatate (TESSALON) 100 MG capsule Take by mouth 3 (three) times daily as needed for cough.    . benztropine (  COGENTIN) 0.5 MG tablet Take 0.5 mg by mouth 2 (two) times daily.    . cholecalciferol (VITAMIN D) 1000 units tablet Take 1,000 Units by mouth daily. Takes 4,000 units daily    . clindamycin (CLEOCIN) 300 MG capsule Take 600 mg by mouth once. Takes 2 hours before dental  procedures  0  . cyclobenzaprine (FLEXERIL) 10 MG tablet Take 10 mg by mouth 2 (two) times daily.  0  . Dexlansoprazole (DEXILANT) 30 MG capsule Take 30 mg by mouth daily.    . diclofenac sodium (VOLTAREN) 1 % GEL Apply 2 g topically 4 (four) times daily. 3 Tube 1  . docusate sodium (COLACE) 100 MG capsule Take 1 capsule (100 mg total) by mouth every 12 (twelve) hours. 60 capsule 0  . DULoxetine (CYMBALTA) 60 MG capsule take 1 capsule by mouth once daily    . furosemide (LASIX) 40 MG tablet Take 40 mg by mouth.    . metolazone (ZAROXOLYN) 2.5 MG tablet Take 2.5 mg by mouth daily.  0  . montelukast (SINGULAIR) 10 MG tablet Take 10 mg by mouth at bedtime.    . potassium chloride SA (K-DUR,KLOR-CON) 20 MEQ tablet Take 20 mEq by mouth daily.    . pregabalin (LYRICA) 100 MG capsule Take 1 capsule (100 mg total) by mouth 3 (three) times daily. 90 capsule 5  . promethazine (PHENERGAN) 25 MG tablet Take 25 mg by mouth every 4 (four) hours as needed for nausea or vomiting.    Marland Kitchen tiZANidine (ZANAFLEX) 4 MG tablet Take 4 mg by mouth every 6 (six) hours as needed for muscle spasms.    . traMADol (ULTRAM) 50 MG tablet TAKE 2 TABLETS BY MOUTH THREE TIMES DAILY AS NEEDED FOR MODERATE PAIN 180 tablet 5  . anastrozole (ARIMIDEX) 1 MG tablet Take 1 mg by mouth daily.    . dabigatran (PRADAXA) 150 MG CAPS capsule Take 150 mg by mouth 2 (two) times daily.     No facility-administered medications prior to visit.     PAST MEDICAL HISTORY: Past Medical History:  Diagnosis Date  . Anemia   . Anxiety   . Arthritis   . Breast cancer ([facility]) 2010   T3N1 invasive ductal carcinoma left breast.Takes Arimidex daily  . Bursitis   . Carpal tunnel syndrome   . Chronic back pain    stenosis  . Constipation    takes Colace daily  . Depression    takes Benzotropine daily  . Diverticulitis of colon   . Fibromyalgia 08/2012  . GERD (gastroesophageal reflux disease)    takes Dexilant daily  . Hemorrhoid   . History of  blood transfusion    no abnormal reaction noted  . History of colon polyps    benign  . History of shingles   . Joint pain   . Joint swelling   . Night muscle spasms    takes Flexeril nightly as needed  . Nocturia   . OSA (obstructive sleep apnea)   . OSA on CPAP   . Peripheral edema    takes Furosemide.Just started 01/18/16  . Peripheral neuropathy    takes Lyrica daily  . Pneumonia    hx of-2015  . Seasonal allergies    takes Singulair nightly  . SOB (shortness of breath) on exertion    rarely with exertion  . 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 Alphonzo Grieve)  PAST SURGICAL HISTORY: Past Surgical History:  Procedure Laterality Date  . ABDOMINAL HYSTERECTOMY     still has ovaries  . APPENDECTOMY    . AXILLARY LYMPH NODE DISSECTION  11/28/2011   Procedure: AXILLARY LYMPH NODE DISSECTION;  Surgeon: Edward Jolly, MD;  Location: [facility];  Service: General;  Laterality: Left;  . BREAST SURGERY Left 2013  . CARPAL TUNNEL RELEASE     Bilateral  . CHOLECYSTECTOMY    . COLONOSCOPY    . EYE SURGERY Left    cataract removal  . KNEE SURGERY     Left Knee  . LUMBAR LAMINECTOMY/DECOMPRESSION MICRODISCECTOMY Bilateral 01/27/2016   Procedure: Laminectomy and Foraminotomy - Lumbar four -Lumbar five - bilateral- on-lay noninstrumented fusion;  Surgeon: Eustace Moore, MD;  Location: [facility];  Service: Neurosurgery;  Laterality: Bilateral;  . MULTIPLE EXTRACTIONS WITH ALVEOLOPLASTY N/A 05/11/2016   Procedure: EXTRACTION OF TEETH EIGHTEEN, TWENTY AND TWENTY- NINE;  REMOVAL OF MANDIBULAR TORUS AND EXOSTOSIS;  Surgeon: Diona Browner, DDS;  Location: [facility];  Service: Oral Surgery;  Laterality: N/A;  . PORTACATH PLACEMENT  06/18/2011   Procedure: INSERTION PORT-A-CATH;  Surgeon: Edward Jolly, MD;  Location: [facility];  Service: General;  Laterality:  Right;  right subclavian  . removal portacath  2014  . spur     Apex spur on both big toes  . TOE SURGERY Bilateral   . TONSILLECTOMY    . TOTAL KNEE ARTHROPLASTY Left 09/13/2014  . TOTAL KNEE ARTHROPLASTY Left 09/13/2014   Procedure: LEFT TOTAL KNEE ARTHROPLASTY;  Surgeon: Rod Can, MD;  Location: [facility];  Service: Orthopedics;  Laterality: Left;  . TOTAL KNEE ARTHROPLASTY Right 03/10/2015   Procedure: RIGHT TOTAL KNEE ARTHROPLASTY;  Surgeon: Rod Can, MD;  Location: [facility];  Service: Orthopedics;  Laterality: Right;    FAMILY HISTORY: Family History  Problem Relation Age of Onset  . Hypertension Mother   . Diabetes Mother   . Hypertension Father   . Diabetes Father   . Cancer Paternal Grandmother        unknown  . Colon cancer Neg Hx     SOCIAL HISTORY: Social History   Social History  . Marital status: Single    Spouse name: N/A  . Number of children: 3  . Years of education: N/A   Occupational History  . Not on file.   Social History Main Topics  . Smoking status: Never Smoker  . Smokeless tobacco: Never Used  . Alcohol use No  . Drug use: No  . Sexual activity: No   Other Topics Concern  . Not on file   Social History Narrative  . No narrative on file      PHYSICAL EXAM  Vitals:   02/12/17 0913  BP: 126/82  Pulse: 76  Weight: 260 lb 6.4 oz (118.1 kg)   Body mass index is 46.13 kg/m.  Generalized: Well developed, in no acute distress   Neurological examination  Mentation: Alert oriented to time, place, history taking. Follows all commands speech and language fluent Cranial nerve II-XII: Pupils were equal round reactive to light. Extraocular movements were full, visual field were full on confrontational test. Facial sensation and strength were normal. Uvula tongue midline. Head turning and shoulder shrug  were normal and symmetric. Motor: The motor testing reveals 5 over 5 strength of all 4 extremities. Good symmetric motor tone is  noted throughout.  Sensory: Sensory testing is intact to soft touch on all 4 extremities.  No evidence of extinction is noted.  Coordination: Cerebellar testing reveals good finger-nose-finger and heel-to-shin bilaterally.  Gait and station: Gait is normal.  Reflexes: Deep tendon reflexes are symmetric and normal bilaterally.   DIAGNOSTIC DATA (LABS, IMAGING, TESTING) - I reviewed patient records, labs, notes, testing and imaging myself where available.  Lab Results  Component Value Date   WBC 3.3 (L) 06/14/2016   HGB 11.4 (L) 06/14/2016   HCT 34.6 (L) 06/14/2016   MCV 85.4 06/14/2016   PLT 181 06/14/2016      Component Value Date/Time   NA 140 06/14/2016 0940   K 3.9 06/14/2016 0940   CL 99 (L) 05/09/2016 1507   CL 101 09/18/2012 0853   CO2 30 (H) 06/14/2016 0940   GLUCOSE 114 06/14/2016 0940   GLUCOSE 94 09/18/2012 0853   BUN 18.6 06/14/2016 0940   CREATININE 0.8 06/14/2016 0940   CALCIUM 9.5 06/14/2016 0940   PROT 6.8 06/14/2016 0940   ALBUMIN 3.5 06/14/2016 0940   AST 19 06/14/2016 0940   ALT 14 06/14/2016 0940   ALKPHOS 92 06/14/2016 0940   BILITOT 0.67 06/14/2016 0940   GFRNONAA >60 05/09/2016 1507   GFRAA >60 05/09/2016 1507      ASSESSMENT AND PLAN 57 y.o. year old female  has a past medical history of Anemia; Anxiety; Arthritis; Breast cancer ([facility]) (2010); Bursitis; Carpal tunnel syndrome; Chronic back pain; Constipation; Depression; Diverticulitis of colon; Fibromyalgia (08/2012); GERD (gastroesophageal reflux disease); Hemorrhoid; History of blood transfusion; History of colon polyps; History of shingles; Joint pain; Joint swelling; Night muscle spasms; Nocturia; OSA (obstructive sleep apnea); OSA on CPAP; Peripheral edema; Peripheral neuropathy; Pneumonia; Seasonal allergies; SOB (shortness of breath) on exertion; and Splenorenal shunt malfunction ([facility]). here with:  1. Benign positional vertigo  Overall the patient is doing well. She states vestibular  rehabilitation was beneficial for her vertigo. I have advised the patient that if her symptoms worsen or she develops new symptoms she should let us know. Otherwise she should continue doing the exercises she learned from physical therapy. She will follow-up on an as-needed basis.    Ward Givens, MSN, NP-C 02/12/2017, 9:29 AM [facility] [address], [place], [place] [address] [phone]

## 2017-02-13 NOTE — Progress Notes (Signed)
I have read the note, and I agree with the clinical assessment and plan.  Haston Casebolt KEITH   

## 2017-02-14 ENCOUNTER — Encounter: Payer: Self-pay | Admitting: Physical Therapy

## 2017-02-14 NOTE — Therapy (Signed)
[facility] [address] [place], [place], [address] Phone: [phone]   Fax:  [phone]  Patient Details  Name: Kelsey Wilson MRN: [id number] Date of Birth: 02-11-1961 Referring Provider:  No ref. provider found  Encounter Date: 02/14/2017  PHYSICAL THERAPY DISCHARGE SUMMARY  Visits from Start of Care: 5  Current functional level related to goals / functional outcomes:     PT Long Term Goals - 11/23/16 1937      PT LONG TERM GOAL #1   Title Pt will report at least 25% improvement in vertigo.  12-07-16   Baseline intermittent in occurrence - unable to provoke with positional testing in eval  - 11-06-16; 11/23/16 has had no vertigo since last appt   Time 4   Period Weeks   Status Achieved     PT LONG TERM GOAL #2   Title Independent in HEP for vestibular exercises and habituation prn.     Baseline 6/22 met   Time 4   Period Weeks   Status Achieved     PT LONG TERM GOAL #3   Title Increase FOTO score from 45/100 to >/= 55/100 to demo improvement in vertigo.  11-06-16   Baseline 45/100   Time 4   Period Weeks   Status Unable to assess  decision to cancel remaining appts made in last 5 minutes of session and did not have time to do ROTO     PT LONG TERM GOAL #4   Title --     PT LONG TERM GOAL #5   Title --       Remaining deficits: Difficulty with VOR x1 exercises (peripheral vestibular disorder).   Education / Equipment: HEP  Plan: Patient agrees to discharge.  Patient goals were partially met. Patient is being discharged due to being pleased with the current functional level.  ?????      Rexanne Mano, PT 02/14/2017, 8:28 AM  [facility] [address] [place], [place], [address] Phone: [phone]   Fax:  [phone]

## 2017-02-19 MED FILL — ANASTROZOLE 1 MG TABLET: 1 | 30 days supply | Qty: 30 | Fill #3

## 2017-02-20 ENCOUNTER — Other Ambulatory Visit: Payer: Self-pay | Admitting: Student

## 2017-02-20 DIAGNOSIS — M5416 Radiculopathy, lumbar region: Secondary | ICD-10-CM

## 2017-03-05 ENCOUNTER — Ambulatory Visit
Admission: RE | Admit: 2017-03-05 | Discharge: 2017-03-05 | Disposition: A | Discharge status: Home / Self Care | Payer: Medicare Other | Source: Ambulatory Visit | Attending: Student | Admitting: Student

## 2017-03-05 ENCOUNTER — Other Ambulatory Visit: Payer: Self-pay | Admitting: *Deleted

## 2017-03-05 DIAGNOSIS — C50412 Malignant neoplasm of upper-outer quadrant of left female breast: Secondary | ICD-10-CM

## 2017-03-05 DIAGNOSIS — M5416 Radiculopathy, lumbar region: Secondary | ICD-10-CM

## 2017-03-05 DIAGNOSIS — Z170 Estrogen receptor positive status [ER+]: Secondary | ICD-10-CM

## 2017-03-05 IMAGING — CR DG MYELOGRAPHY LUMBAR INJ LUMBOSACRAL
6 of 13 series · 6 of 13 positions shown · non-contrast
Comparison: MRI 07/22/2016.  Myelography 11/01/2015.

CLINICAL DATA: Severe low back pain and bilateral leg pain in
weakness. Symptoms are symmetric.
TECHNIQUE: Contiguous axial images were obtained through the Lumbar spine after
the intrathecal infusion of infusion. Coronal and sagittal
reconstructions were obtained of the axial image sets.

[vasc adipose (1 of 3)]
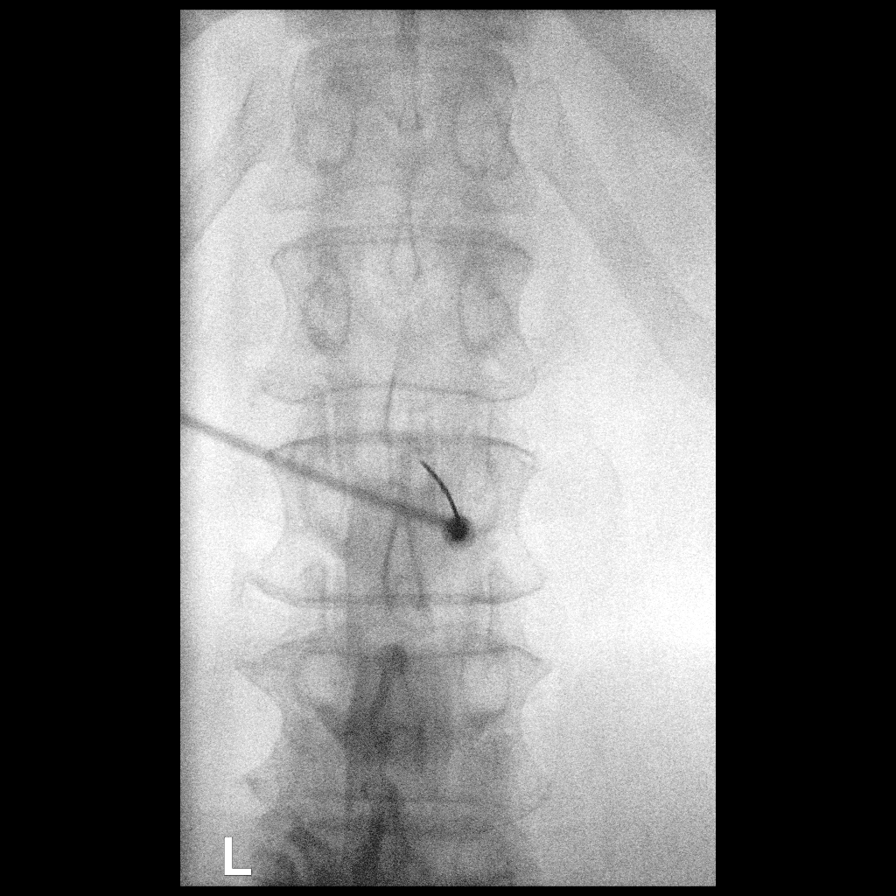

[w lumbar spine ap]
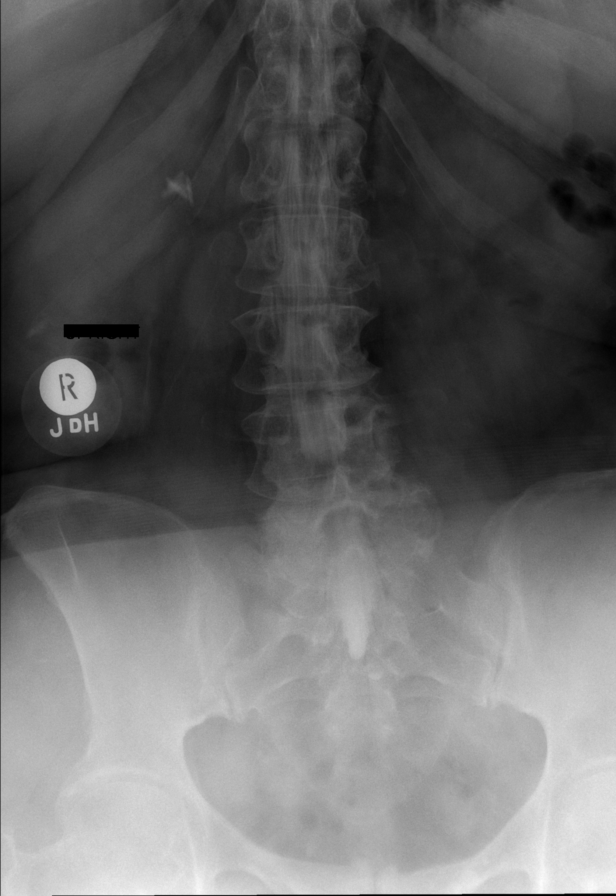

[w lumbar spine lat]
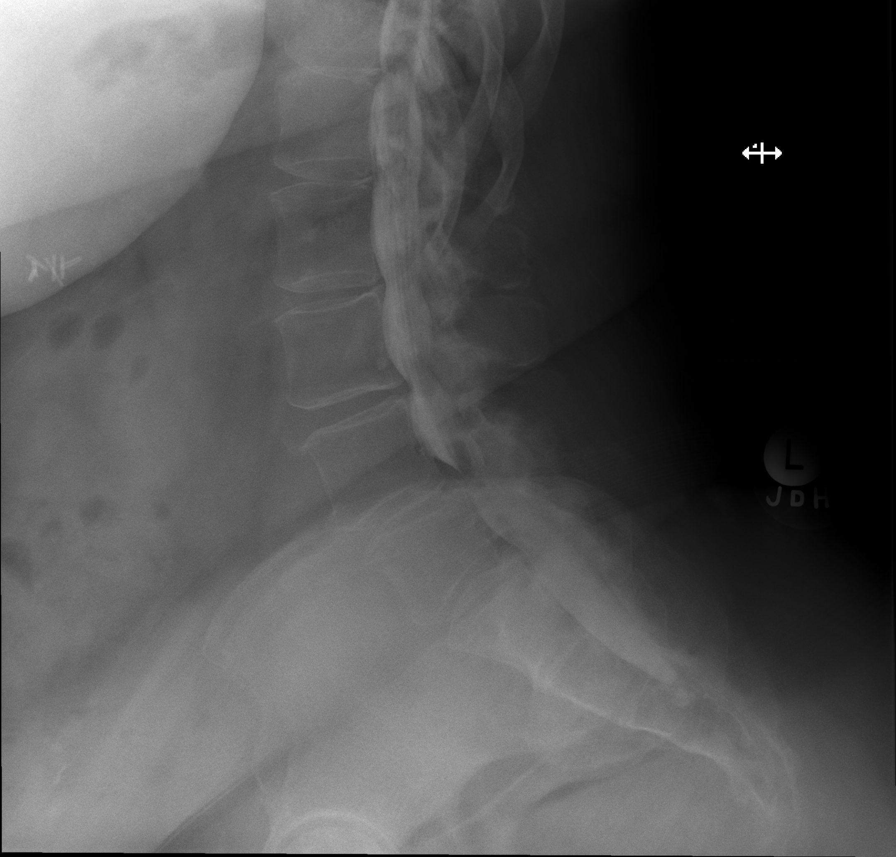

[vasc adipose (2 of 3)]
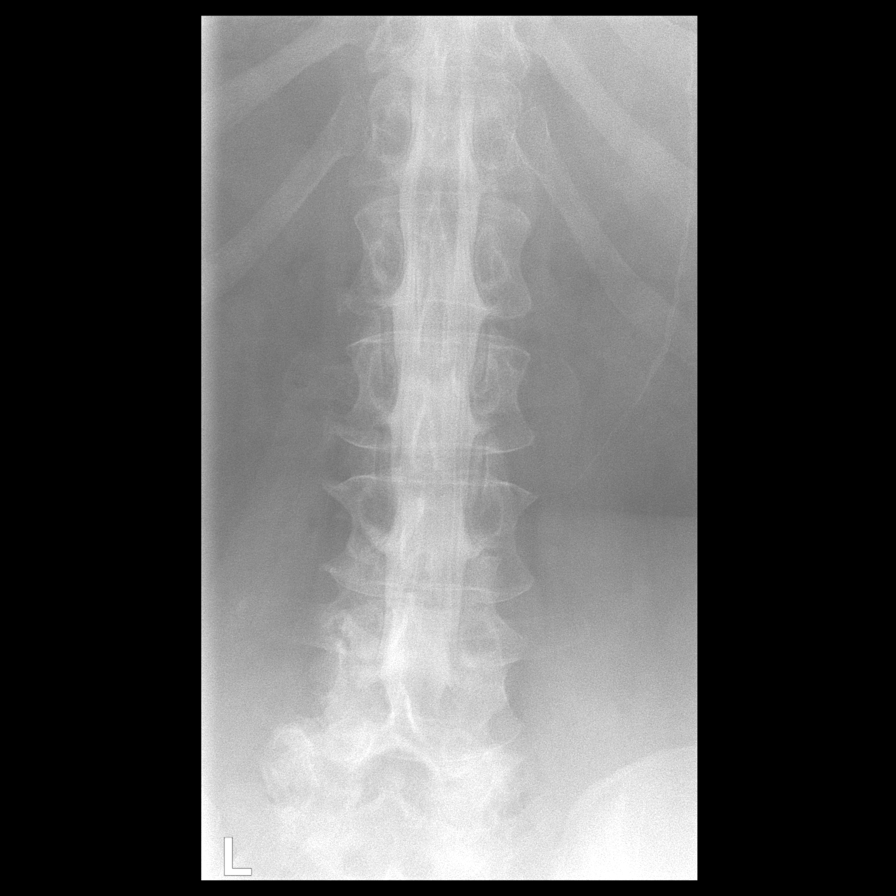

[vasc adipose (3 of 3)]
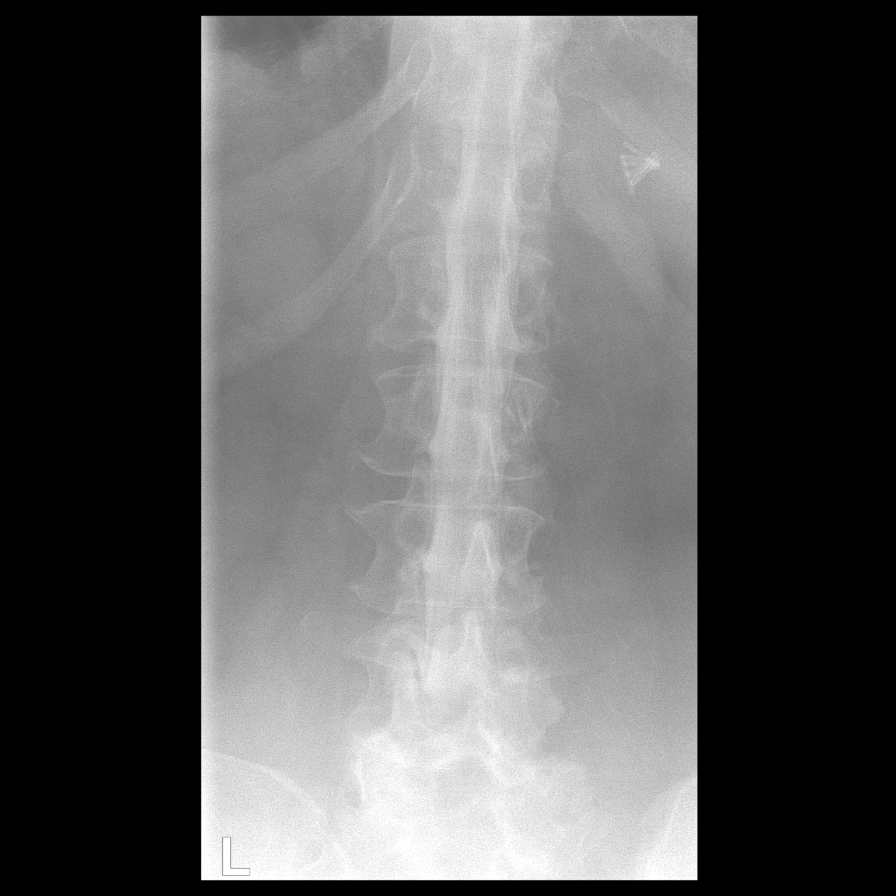

[w lumbar spine flexion]
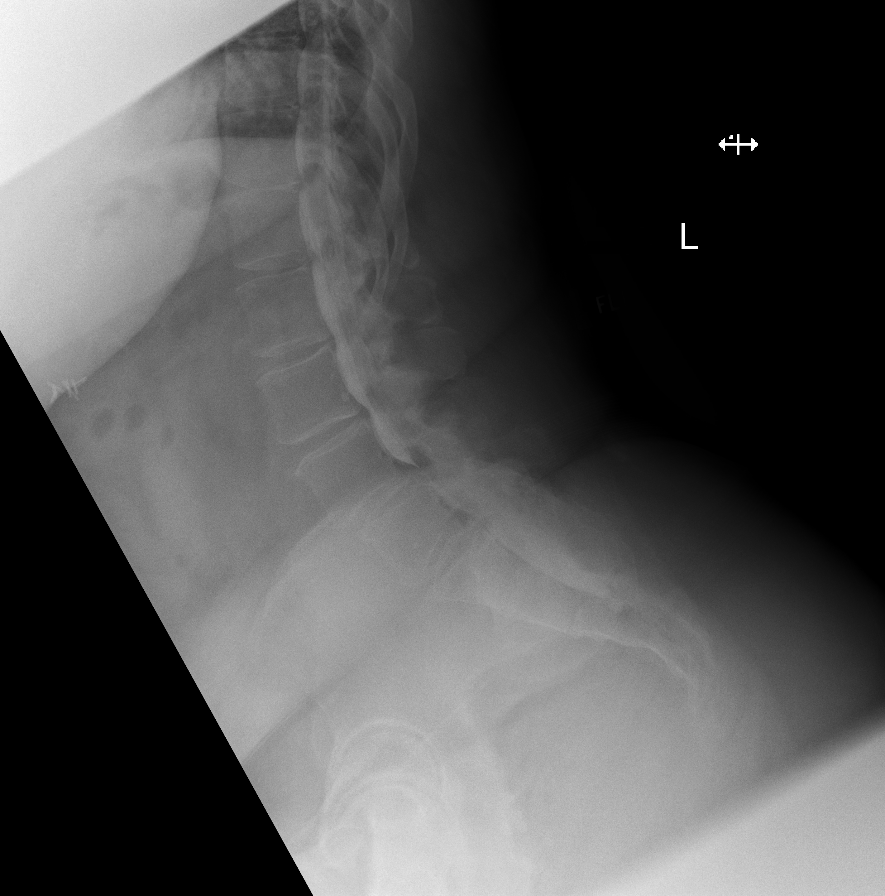

[6 of 13 positions shown; findings below may reference images not displayed]

EXAM:
LUMBAR MYELOGRAM

FLUOROSCOPY TIME:  1 minutes 29 seconds. 496.06 micro gray meter
squared

PROCEDURE:
After thorough discussion of risks and benefits of the procedure
including bleeding, infection, injury to nerves, blood vessels,
adjacent structures as well as headache and CSF leak, written and
oral informed consent was obtained. Consent was obtained by Dr. Za
Arozi. Time out form was completed.

Patient was positioned prone on the fluoroscopy table. Local
anesthesia was provided with 1% lidocaine without epinephrine after
prepped and draped in the usual sterile fashion. Puncture was
performed at L1-2 using a 3 1/2 inch 22-gauge spinal needle via
right para median approach. Using a single pass through the dura,
the needle was placed within the thecal sac, with return of clear
CSF. 15 mL of Isovue 200 was injected into the thecal sac, with
normal opacification of the nerve roots and cauda equina consistent
with free flow within the subarachnoid space.

I personally performed the lumbar puncture and administered the
intrathecal contrast. I also personally performed acquisition of the
myelogram images.
FINDINGS: LUMBAR MYELOGRAM FINDINGS:

No significant finding at L2-3 or above.

L3-4 shows 2 mm anterolisthesis with mild narrowing of the lateral
recesses. L4-5 shows 4 mm of anterolisthesis with moderate
multifactorial stenosis. L5-S1 is poorly seen because of diminished
contrast below L4-5, but there does not appear to be compressive
stenosis at that level. Standing views show a worsening of the
anterolisthesis at L4-5 to about 6 mm and worsening of the stenosis
at that level. 2 mm of anterolisthesis occurs at L5-S1.

CT LUMBAR MYELOGRAM FINDINGS:

T12-L1: Mild bulging of the disc. Mild facet arthritis. No stenosis.

L1-2: Mild bulging of the disc. Mild facet arthritis. No stenosis.
Conus tip at upper L2.

L2-3: Mild bulging of the disc. Mild facet hypertrophy. No stenosis.

L3-4: Mild bulging of the disc. Bilateral facet osteoarthritis. No
anterolisthesis in this position. Mild anterolisthesis occurs in the
prone position. No compressive stenosis demonstrated.

L4-5: Bilateral facet arthropathy. 2-3 mm of anterolisthesis, known
to worsen with standing and flexion. Circumferential bulging of the
disc, focally more prominent in the left extraforaminal region.
Potential for neural compression in the lateral recesses and neural
foramina, left more than right. This appearance would worsen with
standing or flexion.

L5-S1: Bilateral facet arthropathy. Mild bulging of the disc. No
apparent compressive canal stenosis. It appears that there may be
developing or early fusion of the facet joints at this level. There
is narrowing of the proximal foramina because of facet osteophytes.

Bilateral sacroiliac osteoarthritis demonstrated.
IMPRESSION: L3-4: Facet osteoarthritis. 2 mm anterolisthesis at this level
demonstrated at myelography. No apparent compressive stenosis
however.

L4-5: Up to 6 mm of anterolisthesis with standing and flexion.
Spinal stenosis at this level, particularly when the anterolisthesis
occurs. Advanced bilateral facet arthropathy. Potential for neural
compression in the lateral recesses and neural foramina, left more
than right. I think the appearance at this level has worsened when
compared to the study of 11/01/2015.

L5-S1: Bilateral facet arthropathy, which may be developing
bilateral facet fusion. No compressive canal stenosis. Some proximal
foraminal narrowing because of facet osteophytic encroachment.

Bilateral sacroiliac osteoarthritis.

## 2017-03-05 IMAGING — CT CT L SPINE W/ CM
1 of 7 series · 5 of 14 positions shown, 7 images · non-contrast
Comparison: MRI 07/22/2016.  Myelography 11/01/2015.

CLINICAL DATA: Severe low back pain and bilateral leg pain in
weakness. Symptoms are symmetric.
TECHNIQUE: Contiguous axial images were obtained through the Lumbar spine after
the intrathecal infusion of infusion. Coronal and sagittal
reconstructions were obtained of the axial image sets.

[Series 3: l spine soft · axial · 0.32mm/px · z∈[-180,-48]mm · 5 of 68 slices shown, 7 images]
[im 12/68  soft-tissue]
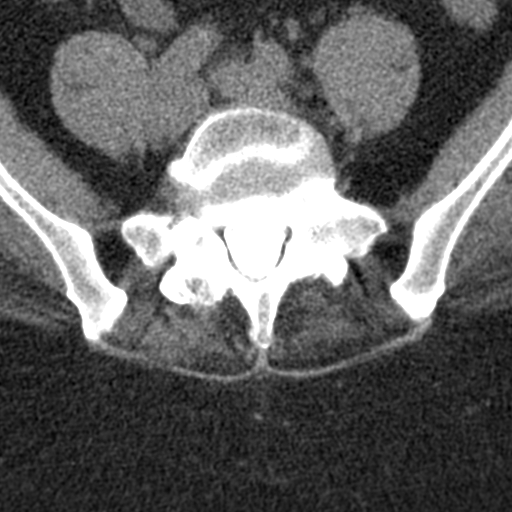
[im 12/68  bone]
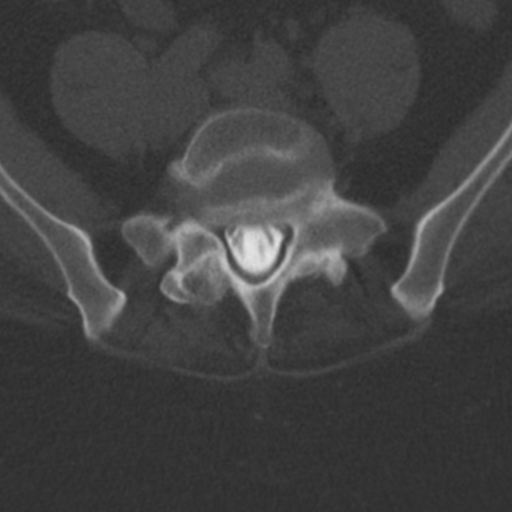
[im 23/68  bone]
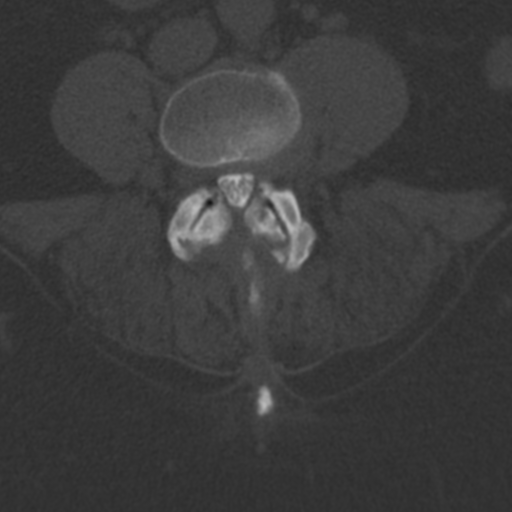
[im 34/68  bone]
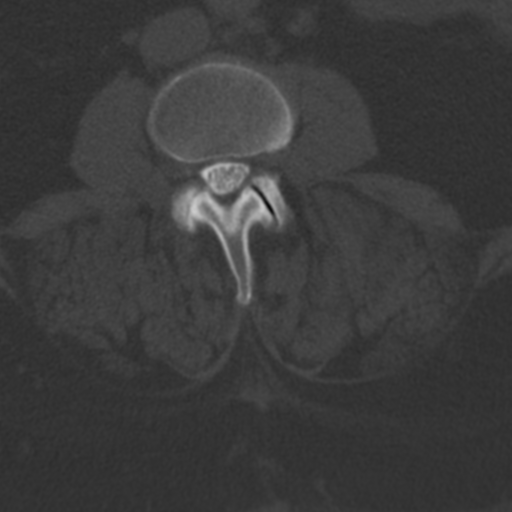
[im 45/68  bone]
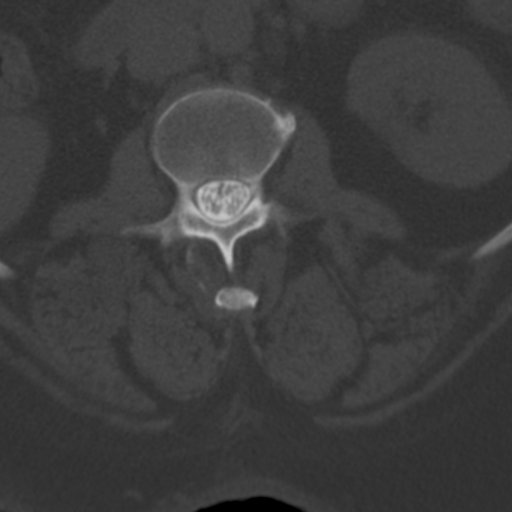
[im 56/68  soft-tissue]
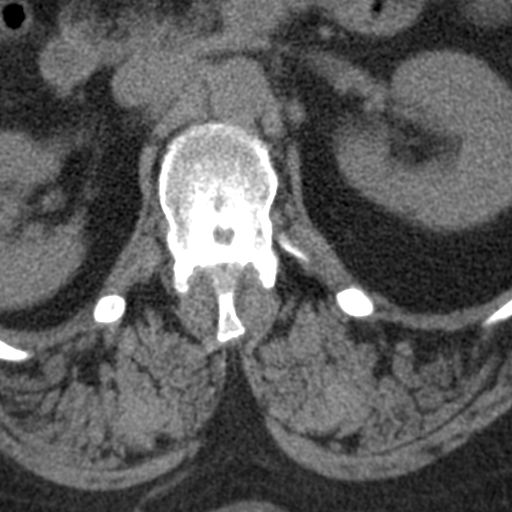
[im 56/68  bone]
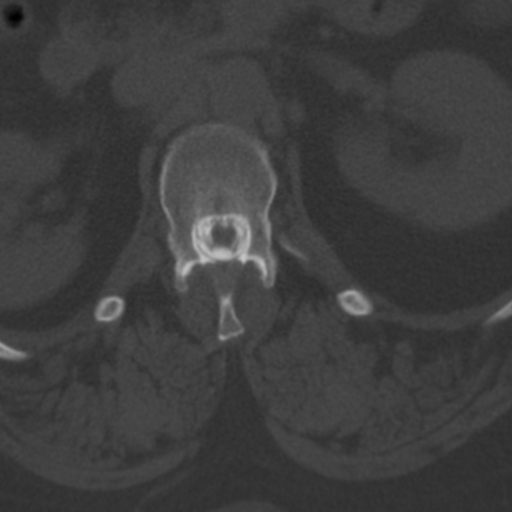

[5 of 14 positions shown; findings below may reference images not displayed]

EXAM:
LUMBAR MYELOGRAM

FLUOROSCOPY TIME:  1 minutes 29 seconds. 496.06 micro gray meter
squared

PROCEDURE:
After thorough discussion of risks and benefits of the procedure
including bleeding, infection, injury to nerves, blood vessels,
adjacent structures as well as headache and CSF leak, written and
oral informed consent was obtained. Consent was obtained by Dr. Za
Arozi. Time out form was completed.

Patient was positioned prone on the fluoroscopy table. Local
anesthesia was provided with 1% lidocaine without epinephrine after
prepped and draped in the usual sterile fashion. Puncture was
performed at L1-2 using a 3 1/2 inch 22-gauge spinal needle via
right para median approach. Using a single pass through the dura,
the needle was placed within the thecal sac, with return of clear
CSF. 15 mL of Isovue 200 was injected into the thecal sac, with
normal opacification of the nerve roots and cauda equina consistent
with free flow within the subarachnoid space.

I personally performed the lumbar puncture and administered the
intrathecal contrast. I also personally performed acquisition of the
myelogram images.
FINDINGS: LUMBAR MYELOGRAM FINDINGS:

No significant finding at L2-3 or above.

L3-4 shows 2 mm anterolisthesis with mild narrowing of the lateral
recesses. L4-5 shows 4 mm of anterolisthesis with moderate
multifactorial stenosis. L5-S1 is poorly seen because of diminished
contrast below L4-5, but there does not appear to be compressive
stenosis at that level. Standing views show a worsening of the
anterolisthesis at L4-5 to about 6 mm and worsening of the stenosis
at that level. 2 mm of anterolisthesis occurs at L5-S1.

CT LUMBAR MYELOGRAM FINDINGS:

T12-L1: Mild bulging of the disc. Mild facet arthritis. No stenosis.

L1-2: Mild bulging of the disc. Mild facet arthritis. No stenosis.
Conus tip at upper L2.

L2-3: Mild bulging of the disc. Mild facet hypertrophy. No stenosis.

L3-4: Mild bulging of the disc. Bilateral facet osteoarthritis. No
anterolisthesis in this position. Mild anterolisthesis occurs in the
prone position. No compressive stenosis demonstrated.

L4-5: Bilateral facet arthropathy. 2-3 mm of anterolisthesis, known
to worsen with standing and flexion. Circumferential bulging of the
disc, focally more prominent in the left extraforaminal region.
Potential for neural compression in the lateral recesses and neural
foramina, left more than right. This appearance would worsen with
standing or flexion.

L5-S1: Bilateral facet arthropathy. Mild bulging of the disc. No
apparent compressive canal stenosis. It appears that there may be
developing or early fusion of the facet joints at this level. There
is narrowing of the proximal foramina because of facet osteophytes.

Bilateral sacroiliac osteoarthritis demonstrated.
IMPRESSION: L3-4: Facet osteoarthritis. 2 mm anterolisthesis at this level
demonstrated at myelography. No apparent compressive stenosis
however.

L4-5: Up to 6 mm of anterolisthesis with standing and flexion.
Spinal stenosis at this level, particularly when the anterolisthesis
occurs. Advanced bilateral facet arthropathy. Potential for neural
compression in the lateral recesses and neural foramina, left more
than right. I think the appearance at this level has worsened when
compared to the study of 11/01/2015.

L5-S1: Bilateral facet arthropathy, which may be developing
bilateral facet fusion. No compressive canal stenosis. Some proximal
foraminal narrowing because of facet osteophytic encroachment.

Bilateral sacroiliac osteoarthritis.

## 2017-03-05 MED ORDER — MEPERIDINE HCL 100 MG/ML IJ SOLN
75.0000 mg | Freq: Once | INTRAMUSCULAR | Status: AC
  Administered 2017-03-05: 75 mg via INTRAMUSCULAR

## 2017-03-05 MED ORDER — ONDANSETRON HCL 4 MG/2ML IJ SOLN
4.0000 mg | Freq: Once | INTRAMUSCULAR | Status: AC
  Administered 2017-03-05: 4 mg via INTRAMUSCULAR

## 2017-03-05 MED ORDER — IOPAMIDOL (ISOVUE-M 200) INJECTION 41%
18.0000 mL | Freq: Once | INTRAMUSCULAR | Status: AC
  Administered 2017-03-05: 18 mL via INTRATHECAL

## 2017-03-05 MED ORDER — DIAZEPAM 5 MG PO TABS
10.0000 mg | ORAL_TABLET | Freq: Once | ORAL | Status: AC
  Administered 2017-03-05: 10 mg via ORAL

## 2017-03-05 MED ORDER — ONDANSETRON HCL 4 MG/2ML IJ SOLN: 4.0000 mg | Freq: Four times a day (QID) | INTRAMUSCULAR | Status: DC | PRN

## 2017-03-05 NOTE — Progress Notes (Signed)
Patient states she has been off Cymbalta, Phenergan and Tramadol for at least the past two days.  Brita Romp, RN

## 2017-03-05 NOTE — Discharge Instructions (Signed)
Myelogram Discharge Instructions  1. Go home and rest quietly for the next 24 hours.  It is important to lie flat for the next 24 hours.  Get up only to go to the restroom.  You may lie in the bed or on a couch on your back, your stomach, your left side or your right side.  You may have one pillow under your head.  You may have pillows between your knees while you are on your side or under your knees while you are on your back.  2. DO NOT drive today.  Recline the seat as far back as it will go, while still wearing your seat belt, on the way home.  3. You may get up to go to the bathroom as needed.  You may sit up for 10 minutes to eat.  You may resume your normal diet and medications unless otherwise indicated.  Drink plenty of extra fluids today and tomorrow.  4. The incidence of a spinal headache with nausea and/or vomiting is about 5% (one in 20 patients).  If you develop a headache, lie flat and drink plenty of fluids until the headache goes away.  Caffeinated beverages may be helpful.  If you develop severe nausea and vomiting or a headache that does not go away with flat bed rest, call [phone].  5. You may resume normal activities after your 24 hours of bed rest is over; however, do not exert yourself strongly or do any heavy lifting tomorrow.  6. Call your physician for a follow-up appointment.    You may resume Cymbalta, Phenergan and Tramadol on Wednesday, March 06, 2017 after 10:30a.m.

## 2017-03-07 ENCOUNTER — Encounter: Payer: Self-pay | Admitting: Adult Health

## 2017-03-07 ENCOUNTER — Encounter: Payer: Self-pay | Admitting: Physical Medicine & Rehabilitation

## 2017-03-07 ENCOUNTER — Ambulatory Visit (HOSPITAL_BASED_OUTPATIENT_CLINIC_OR_DEPARTMENT_OTHER): Payer: Medicare Other | Admitting: Physical Medicine & Rehabilitation

## 2017-03-07 ENCOUNTER — Ambulatory Visit (HOSPITAL_BASED_OUTPATIENT_CLINIC_OR_DEPARTMENT_OTHER): Payer: Medicare Other | Admitting: Adult Health

## 2017-03-07 ENCOUNTER — Other Ambulatory Visit (HOSPITAL_BASED_OUTPATIENT_CLINIC_OR_DEPARTMENT_OTHER): Payer: Medicare Other

## 2017-03-07 ENCOUNTER — Telehealth: Payer: Self-pay

## 2017-03-07 ENCOUNTER — Encounter: Payer: Medicare Other | Attending: Physical Medicine & Rehabilitation

## 2017-03-07 ENCOUNTER — Ambulatory Visit: Payer: Medicare Other

## 2017-03-07 VITALS — BP 129/85 | HR 77

## 2017-03-07 VITALS — BP 131/70 | HR 84 | Temp 98.0°F | Resp 18 | Ht 63.0 in | Wt 251.8 lb

## 2017-03-07 DIAGNOSIS — M47896 Other spondylosis, lumbar region: Secondary | ICD-10-CM | POA: Diagnosis present

## 2017-03-07 DIAGNOSIS — M545 Low back pain: Secondary | ICD-10-CM | POA: Diagnosis not present

## 2017-03-07 DIAGNOSIS — Z170 Estrogen receptor positive status [ER+]: Secondary | ICD-10-CM

## 2017-03-07 DIAGNOSIS — M47817 Spondylosis without myelopathy or radiculopathy, lumbosacral region: Secondary | ICD-10-CM

## 2017-03-07 DIAGNOSIS — M4316 Spondylolisthesis, lumbar region: Secondary | ICD-10-CM | POA: Diagnosis not present

## 2017-03-07 DIAGNOSIS — G620 Drug-induced polyneuropathy: Secondary | ICD-10-CM

## 2017-03-07 DIAGNOSIS — C50412 Malignant neoplasm of upper-outer quadrant of left female breast: Secondary | ICD-10-CM | POA: Diagnosis present

## 2017-03-07 DIAGNOSIS — I890 Lymphedema, not elsewhere classified: Secondary | ICD-10-CM | POA: Diagnosis not present

## 2017-03-07 DIAGNOSIS — N644 Mastodynia: Secondary | ICD-10-CM | POA: Diagnosis not present

## 2017-03-07 DIAGNOSIS — Z5181 Encounter for therapeutic drug level monitoring: Secondary | ICD-10-CM | POA: Diagnosis not present

## 2017-03-07 DIAGNOSIS — M797 Fibromyalgia: Secondary | ICD-10-CM | POA: Diagnosis not present

## 2017-03-07 DIAGNOSIS — Z79899 Other long term (current) drug therapy: Secondary | ICD-10-CM | POA: Diagnosis not present

## 2017-03-07 DIAGNOSIS — M48062 Spinal stenosis, lumbar region with neurogenic claudication: Secondary | ICD-10-CM

## 2017-03-07 DIAGNOSIS — G894 Chronic pain syndrome: Secondary | ICD-10-CM | POA: Diagnosis not present

## 2017-03-07 DIAGNOSIS — E559 Vitamin D deficiency, unspecified: Secondary | ICD-10-CM

## 2017-03-07 LAB — COMPREHENSIVE METABOLIC PANEL
ALT: 17 U/L (ref 0–55)
AST: 21 U/L (ref 5–34)
Albumin: 4 g/dL (ref 3.5–5.0)
Alkaline Phosphatase: 92 U/L (ref 40–150)
Anion Gap: 11 mEq/L (ref 3–11)
BUN: 16 mg/dL (ref 7.0–26.0)
CO2: 32 mEq/L — ABNORMAL HIGH (ref 22–29)
Calcium: 10 mg/dL (ref 8.4–10.4)
Chloride: 101 mEq/L (ref 98–109)
Creatinine: 1 mg/dL (ref 0.6–1.1)
EGFR: 74 mL/min/{1.73_m2} — ABNORMAL LOW (ref >90)
Glucose: 114 mg/dl (ref 70–140)
Potassium: 3.3 mEq/L — ABNORMAL LOW (ref 3.5–5.1)
Sodium: 144 mEq/L (ref 136–145)
Total Bilirubin: 0.8 mg/dL (ref 0.20–1.20)
Total Protein: 8.1 g/dL (ref 6.4–8.3)

## 2017-03-07 LAB — CBC WITH DIFFERENTIAL/PLATELET
BASO%: 0.4 % (ref 0.0–2.0)
Basophils Absolute: 0 10*3/uL (ref 0.0–0.1)
EOS%: 3.4 % (ref 0.0–7.0)
Eosinophils Absolute: 0.1 10*3/uL (ref 0.0–0.5)
HCT: 41.5 % (ref 34.8–46.6)
HGB: 13.6 g/dL (ref 11.6–15.9)
LYMPH%: 43.9 % (ref 14.0–49.7)
MCH: 28.4 pg (ref 25.1–34.0)
MCHC: 32.7 g/dL (ref 31.5–36.0)
MCV: 86.8 fL (ref 79.5–101.0)
MONO#: 0.4 10*3/uL (ref 0.1–0.9)
MONO%: 9.1 % (ref 0.0–14.0)
NEUT#: 1.9 10*3/uL (ref 1.5–6.5)
NEUT%: 43.2 % (ref 38.4–76.8)
Platelets: 189 10*3/uL (ref 145–400)
RBC: 4.79 10*6/uL (ref 3.70–5.45)
RDW: 14.4 % (ref 11.2–14.5)
WBC: 4.4 10*3/uL (ref 3.9–10.3)
lymph#: 1.9 10*3/uL (ref 0.9–3.3)

## 2017-03-07 MED ORDER — ANASTROZOLE 1 MG PO TABS: 1.0000 mg | ORAL_TABLET | Freq: Every day | ORAL | 5 refills | Status: DC

## 2017-03-07 MED ORDER — PREGABALIN 100 MG PO CAPS: 100.0000 mg | ORAL_CAPSULE | Freq: Three times a day (TID) | ORAL | 5 refills | Status: DC

## 2017-03-07 NOTE — Telephone Encounter (Signed)
Lab results for 03/07/17 faxed to Dr. Alphonzo Grieve MD

## 2017-03-07 NOTE — Patient Instructions (Signed)
If you undergo surgery, Dr. Ronnald Ramp will manage your narcotic medications postoperatively

## 2017-03-07 NOTE — Progress Notes (Signed)
Subjective:    Patient ID: Kelsey Wilson, female    DOB: [birth year], 56 y.o.   MRN: [id number]  HPI  56 year old female treated at this clinic for low back pain, chronic, and fibromyalgia. She had imaging studies positive for lumbar spondylosis, but did not respond to lumbar medial branch blocks under fluoroscopic guidance. Patient states she has also seen Dr. Nelva Bush did some type of injection, but she does not recall exactly what. Evaluated by neurosurgery, Dr Ronnald Ramp- ordered CT myelogram of Lumbar spine Patient has radiating hip pain with ambulation Pain Inventory Average Pain 7 Pain Right Now 7 My pain is sharp and aching  In the last 24 hours, has pain interfered with the following? General activity 3 Relation with others 4 Enjoyment of life 4 What TIME of day is your pain at its worst? . Sleep (in general) Fair  Pain is worse with: walking, bending, sitting, inactivity and standing Pain improves with: . Relief from Meds: 2  Mobility use a cane ability to climb steps?  no do you drive?  yes  Function disabled: date disabled . I need assistance with the following:  meal prep, household duties and shopping  Neuro/Psych numbness spasms depression anxiety  Prior Studies Any changes since last visit?  no EXAM: LUMBAR MYELOGRAM   FLUOROSCOPY TIME:  1 minutes 29 seconds. 496.06 micro gray meter squared   PROCEDURE: After thorough discussion of risks and benefits of the procedure including bleeding, infection, injury to nerves, blood vessels, adjacent structures as well as headache and CSF leak, written and oral informed consent was obtained. Consent was obtained by Dr. Nelson Chimes. Time out form was completed.   Patient was positioned prone on the fluoroscopy table. Local anesthesia was provided with 1% lidocaine without epinephrine after prepped and draped in the usual sterile fashion. Puncture was performed at L1-2 using a 3 1/2 inch 22-gauge spinal needle  via right para median approach. Using a single pass through the dura, the needle was placed within the thecal sac, with return of clear CSF. 15 mL of Isovue 200 was injected into the thecal sac, with normal opacification of the nerve roots and cauda equina consistent with free flow within the subarachnoid space.   I personally performed the lumbar puncture and administered the intrathecal contrast. I also personally performed acquisition of the myelogram images.   TECHNIQUE: Contiguous axial images were obtained through the Lumbar spine after the intrathecal infusion of infusion. Coronal and sagittal reconstructions were obtained of the axial image sets.   COMPARISON:  MRI 07/22/2016.  Myelography 11/01/2015.   FINDINGS: LUMBAR MYELOGRAM FINDINGS:   No significant finding at L2-3 or above.   L3-4 shows 2 mm anterolisthesis with mild narrowing of the lateral recesses. L4-5 shows 4 mm of anterolisthesis with moderate multifactorial stenosis. L5-S1 is poorly seen because of diminished contrast below L4-5, but there does not appear to be compressive stenosis at that level. Standing views show a worsening of the anterolisthesis at L4-5 to about 6 mm and worsening of the stenosis at that level. 2 mm of anterolisthesis occurs at L5-S1.   CT LUMBAR MYELOGRAM FINDINGS:   T12-L1: Mild bulging of the disc. Mild facet arthritis. No stenosis.   L1-2: Mild bulging of the disc. Mild facet arthritis. No stenosis. Conus tip at upper L2.   L2-3: Mild bulging of the disc. Mild facet hypertrophy. No stenosis.   L3-4: Mild bulging of the disc. Bilateral facet osteoarthritis. No anterolisthesis in this position. Mild anterolisthesis occurs  in the prone position. No compressive stenosis demonstrated.   L4-5: Bilateral facet arthropathy. 2-3 mm of anterolisthesis, known to worsen with standing and flexion. Circumferential bulging of the disc, focally more prominent in the left extraforaminal  region. Potential for neural compression in the lateral recesses and neural foramina, left more than right. This appearance would worsen with standing or flexion.   L5-S1: Bilateral facet arthropathy. Mild bulging of the disc. No apparent compressive canal stenosis. It appears that there may be developing or early fusion of the facet joints at this level. There is narrowing of the proximal foramina because of facet osteophytes.   Bilateral sacroiliac osteoarthritis demonstrated.   IMPRESSION: L3-4: Facet osteoarthritis. 2 mm anterolisthesis at this level demonstrated at myelography. No apparent compressive stenosis however.   L4-5: Up to 6 mm of anterolisthesis with standing and flexion. Spinal stenosis at this level, particularly when the anterolisthesis occurs. Advanced bilateral facet arthropathy. Potential for neural compression in the lateral recesses and neural foramina, left more than right. I think the appearance at this level has worsened when compared to the study of 11/01/2015.   L5-S1: Bilateral facet arthropathy, which may be developing bilateral facet fusion. No compressive canal stenosis. Some proximal foraminal narrowing because of facet osteophytic encroachment.   Bilateral sacroiliac osteoarthritis.     Electronically Signed   By: Nelson Chimes M.D.   On: 03/05/2017 12:37  Physicians involved in your care Any changes since last visit?  no   Family History  Problem Relation Age of Onset  . Hypertension Mother   . Diabetes Mother   . Hypertension Father   . Diabetes Father   . Cancer Paternal Grandmother        unknown  . Colon cancer Neg Hx    Social History   Social History  . Marital status: Single    Spouse name: N/A  . Number of children: 3  . Years of education: N/A   Social History Main Topics  . Smoking status: Never Smoker  . Smokeless tobacco: Never Used  . Alcohol use No  . Drug use: No  . Sexual activity: No   Other Topics  Concern  . Not on file   Social History Narrative  . No narrative on file   Past Surgical History:  Procedure Laterality Date  . ABDOMINAL HYSTERECTOMY     still has ovaries  . APPENDECTOMY    . AXILLARY LYMPH NODE DISSECTION  11/28/2011   Procedure: AXILLARY LYMPH NODE DISSECTION;  Surgeon: Edward Jolly, MD;  Location: [facility];  Service: General;  Laterality: Left;  . BREAST SURGERY Left 2013  . CARPAL TUNNEL RELEASE     Bilateral  . CHOLECYSTECTOMY    . COLONOSCOPY    . EYE SURGERY Left    cataract removal  . KNEE SURGERY     Left Knee  . LUMBAR LAMINECTOMY/DECOMPRESSION MICRODISCECTOMY Bilateral 01/27/2016   Procedure: Laminectomy and Foraminotomy - Lumbar four -Lumbar five - bilateral- on-lay noninstrumented fusion;  Surgeon: Eustace Moore, MD;  Location: [facility];  Service: Neurosurgery;  Laterality: Bilateral;  . MULTIPLE EXTRACTIONS WITH ALVEOLOPLASTY N/A 05/11/2016   Procedure: EXTRACTION OF TEETH EIGHTEEN, TWENTY AND TWENTY- NINE;  REMOVAL OF MANDIBULAR TORUS AND EXOSTOSIS;  Surgeon: Diona Browner, DDS;  Location: [facility];  Service: Oral Surgery;  Laterality: N/A;  . PORTACATH PLACEMENT  06/18/2011   Procedure: INSERTION PORT-A-CATH;  Surgeon: Edward Jolly, MD;  Location: [facility];  Service: General;  Laterality:  Right;  right subclavian  . removal portacath  2014  . spur     Apex spur on both big toes  . TOE SURGERY Bilateral   . TONSILLECTOMY    . TOTAL KNEE ARTHROPLASTY Left 09/13/2014  . TOTAL KNEE ARTHROPLASTY Left 09/13/2014   Procedure: LEFT TOTAL KNEE ARTHROPLASTY;  Surgeon: Rod Can, MD;  Location: [facility];  Service: Orthopedics;  Laterality: Left;  . TOTAL KNEE ARTHROPLASTY Right 03/10/2015   Procedure: RIGHT TOTAL KNEE ARTHROPLASTY;  Surgeon: Rod Can, MD;  Location: [facility];  Service: Orthopedics;  Laterality: Right;   Past Medical History:  Diagnosis Date  . Anemia   . Anxiety   . Arthritis   . Breast cancer ([place]) 2010    T3N1 invasive ductal carcinoma left breast.Takes Arimidex daily  . Bursitis   . Carpal tunnel syndrome   . Chronic back pain    stenosis  . Constipation    takes Colace daily  . Depression    takes Benzotropine daily  . Diverticulitis of colon   . Fibromyalgia 08/2012  . GERD (gastroesophageal reflux disease)    takes Dexilant daily  . Hemorrhoid   . History of blood transfusion    no abnormal reaction noted  . History of colon polyps    benign  . History of shingles   . Joint pain   . Joint swelling   . Night muscle spasms    takes Flexeril nightly as needed  . Nocturia   . OSA (obstructive sleep apnea)   . OSA on CPAP   . Peripheral edema    takes Furosemide.Just started 01/18/16  . Peripheral neuropathy    takes Lyrica daily  . Pneumonia    hx of-2015  . Seasonal allergies    takes Singulair nightly  . SOB (shortness of breath) on exertion    rarely with exertion  . 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 Alphonzo Grieve)   There were no vitals taken for this visit.  Opioid Risk Score:   Fall Risk Score:  `1  Depression screen PHQ 2/9  Depression screen [facility] 2/9 08/23/2015 04/26/2015 02/04/2015  Decreased Interest 2 0 0  Down, Depressed, Hopeless 0 0 0  PHQ - 2 Score 2 0 0  Altered sleeping 1 - -  Tired, decreased energy 3 - -  Change in appetite 3 - -  Feeling bad or failure about yourself  0 - -  Trouble concentrating 3 - -  Moving slowly or fidgety/restless 2 - -  Suicidal thoughts 0 - -  PHQ-9 Score 14 - -  Difficult doing work/chores Very difficult - -  Some recent data might be hidden     Review of Systems  Constitutional: Positive for diaphoresis and unexpected weight change.  HENT: Negative.   Eyes: Negative.   Respiratory: Negative.   Cardiovascular: Negative.   Gastrointestinal: Negative.   Endocrine: Negative.   Genitourinary:  Negative.   Musculoskeletal: Positive for joint swelling.  Skin: Negative.   Allergic/Immunologic: Negative.   Neurological: Negative.   Hematological: Negative.   Psychiatric/Behavioral: Negative.   All other systems reviewed and are negative.      Objective:   Physical Exam  Constitutional: She is oriented to person, place, and time. She appears well-developed and well-nourished. No distress.  HENT:  Head: Normocephalic and atraumatic.  Eyes: Pupils are equal, round, and reactive to light. Conjunctivae and  EOM are normal. Right eye exhibits no discharge. Left eye exhibits no discharge.  Neurological: She is alert and oriented to person, place, and time.  Sensation reduced right L5 and S1 dermatomal distribution to pinprick. Motor strength is 4/5 with give way weakness bilateral hip flexors, knee extensors, ankle dorsiflexors. Negative straight leg raising. Gait without evidence of toe drag or knee instability  Skin: Skin is warm and dry. She is not diaphoretic.  Nursing note and vitals reviewed.  Tenderness. Palpation along the trapezius, cervical, thoracic and lumbar paraspinal muscles, as well as over the hip girdle musculature.       Assessment & Plan:   1. Lumbar spondylosis and lumbar spondylolisthesis as well as moderate spinal stenosis L4-5. Her sensory symptoms on the right side may be related to her spinal stenosis. We discussed conservative treatment plans including weight loss and building core strength. This would obviously be a slow process. She will follow-up with neurosurgery. If she undergoes lumbar decompressive surgery plus minus fusion, she would be receiving postoperative medications from her neurosurgeon. I can see her again in 6 months. I don't think there is any role for lumbar spine injections at the current time.  Continue tramadol 100 mg 3 times a day for now,  Check drug screen toxicology. Reviewed PMP multiple prescribers , her primary care  prescribed hydrocodone and oxycodone within the last year. We discussed that she needs to limit this. However, postoperatively. She will get her pain medications from neurosurgery.  Elevated depression screen. Referral to neuropsychology

## 2017-03-07 NOTE — Progress Notes (Signed)
Kelsey Wilson  [phone]) [phone] [phone]  OFFICE PROGRESS NOTE   ID: LYNDIE VANDERLOOP   DOB: 12/26/1960  MR#: [id number]  UYQ#:[id number]  PCP: Javier Docker, MD GYN:  SU: Excell Seltzer, MD OTHER MD: Earnie Larsson,  Theodis Sato, MD, Alysia Penna mD  CHIEF COMPLAINT: left breast cancer  CURRENT THERAPY: anastrozole   BREAST CANCER HISTORY: From Dr. Julien Girt note 06/13/2011:   "The patient first noticed a lump in the upper outer left breast about mid November. She had been having regular mammograms. She consulted her physician and was referred to the breast center for further workup. Bilateral mammogram was performed which revealed an irregular mass in the upper outer quadrant of the left breast at the 2:00 position measuring approximately 3 cm. A clearly enlarged left axillary lymph node was also seen. 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 Ki-67 was 100%. Subsequent bilateral breast MRI was performed. This reveals abnormal enhancement in the area of the mass measuring 2.9 x 5.2 cm. Again noted was an approximately 3 cm axillary lymph node. No other areas were detected."   Her subsequent history is as detailed below.  INTERVAL HISTORY: [name] is here today for follow up of her left breast cancer.  She is taking Anastrozole daily.  She is tolerating it well.  She is having some issues with her left breast.  This started today.  She began to notice an increase in pain in her nipple.  No discharge, no breast changes noted.    REVIEW OF SYSTEMS: She has chronic lower back pain and did recently undergo testing.  She is under the care of a neurosurgeon and has f/u with them next week.  She also has residual peripheral neuropathy from chemotherapy, this is stable.  Otherwise a detailed ros was non  contributory.   PAST MEDICAL HISTORY: Past Medical History:  Diagnosis Date  . Anemia   . Anxiety   . Arthritis   . Breast cancer ([facility]) 2010   T3N1 invasive ductal carcinoma left breast.Takes Arimidex daily  . Bursitis   . Carpal tunnel syndrome   . Chronic back pain    stenosis  . Constipation    takes Colace daily  . Depression    takes Benzotropine daily  . Diverticulitis of colon   . Fibromyalgia 08/2012  . GERD (gastroesophageal reflux disease)    takes Dexilant daily  . Hemorrhoid   . History of blood transfusion    no abnormal reaction noted  . History of colon polyps    benign  . History of shingles   . Joint pain   . Joint swelling   . Night muscle spasms    takes Flexeril nightly as needed  . Nocturia   . OSA (obstructive sleep apnea)   . OSA on CPAP   . Peripheral edema    takes Furosemide.Just started 01/18/16  . Peripheral neuropathy    takes Lyrica daily  . Pneumonia    hx of-2015  . Seasonal allergies    takes Singulair nightly  . SOB (shortness of breath) on exertion    rarely with exertion  . 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 Alphonzo Grieve)    PAST SURGICAL HISTORY: Past Surgical History:  Procedure Laterality Date  .  ABDOMINAL HYSTERECTOMY     still has ovaries  . APPENDECTOMY    . AXILLARY LYMPH NODE DISSECTION  11/28/2011   Procedure: AXILLARY LYMPH NODE DISSECTION;  Surgeon: Edward Jolly, MD;  Location: [facility];  Service: General;  Laterality: Left;  . BREAST SURGERY Left 2013  . CARPAL TUNNEL RELEASE     Bilateral  . CHOLECYSTECTOMY    . COLONOSCOPY    . EYE SURGERY Left    cataract removal  . KNEE SURGERY     Left Knee  . LUMBAR LAMINECTOMY/DECOMPRESSION MICRODISCECTOMY Bilateral 01/27/2016   Procedure: Laminectomy and Foraminotomy - Lumbar four -Lumbar five - bilateral- on-lay noninstrumented fusion;  Surgeon:  Eustace Moore, MD;  Location: [facility];  Service: Neurosurgery;  Laterality: Bilateral;  . MULTIPLE EXTRACTIONS WITH ALVEOLOPLASTY N/A 05/11/2016   Procedure: EXTRACTION OF TEETH EIGHTEEN, TWENTY AND TWENTY- NINE;  REMOVAL OF MANDIBULAR TORUS AND EXOSTOSIS;  Surgeon: Diona Browner, DDS;  Location: [facility];  Service: Oral Surgery;  Laterality: N/A;  . PORTACATH PLACEMENT  06/18/2011   Procedure: INSERTION PORT-A-CATH;  Surgeon: Edward Jolly, MD;  Location: [facility];  Service: General;  Laterality: Right;  right subclavian  . removal portacath  2014  . spur     Apex spur on both big toes  . TOE SURGERY Bilateral   . TONSILLECTOMY    . TOTAL KNEE ARTHROPLASTY Left 09/13/2014  . TOTAL KNEE ARTHROPLASTY Left 09/13/2014   Procedure: LEFT TOTAL KNEE ARTHROPLASTY;  Surgeon: Rod Can, MD;  Location: [facility];  Service: Orthopedics;  Laterality: Left;  . TOTAL KNEE ARTHROPLASTY Right 03/10/2015   Procedure: RIGHT TOTAL KNEE ARTHROPLASTY;  Surgeon: Rod Can, MD;  Location: [facility];  Service: Orthopedics;  Laterality: Right;    FAMILY HISTORY Family History  Problem Relation Age of Onset  . Hypertension Mother   . Diabetes Mother   . Hypertension Father   . Diabetes Father   . Cancer Paternal Grandmother        unknown  . Colon cancer Neg Hx   Her father, 26 years old is the minister at [employer] she attends. Her mother is 60. The patient has 3 brothers and 3 sisters. There is no history of breast or ovarian cancer in the immediate family.  GYNECOLOGIC HISTORY: Menarche age 56, first live birth age 56, she is Liborio Negron Torres P3. She underwent hysterectomy without salpingo-oophorectomy in 2000. She never took hormone replacement.  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 She is divorced. At home she lives with herr daughter Kelsey Wilson and her 6 children. Her son Kelsey Wilson works as a Administrator. The patient  has 5 additional grandchildren. She attends a [employer].    ADVANCED DIRECTIVES: Not in place  HEALTH MAINTENANCE: Social History  Substance Use Topics  . Smoking status: Never Smoker  . Smokeless tobacco: Never Used  . Alcohol use No     Colonoscopy: Not on file  PAP: Scheduled for January 2016  Bone density: Bone density December 2015 was normal.  Lipid panel: Not on file  Allergies  Allergen Reactions  . Oxycodone Other (See Comments)    hallucinations  . Aleve [Naproxen] Nausea Only  . Compazine [Prochlorperazine] Other (See Comments)    Numbness of face and  lips  . Penicillins Nausea Only    Has patient had a PCN reaction causing immediate rash, facial/tongue/throat swelling, SOB or lightheadedness with hypotension: No Has  patient had a PCN reaction causing severe rash involving mucus membranes or skin necrosis: No Has patient had a PCN reaction that required hospitalization No Has patient had a PCN reaction occurring within the last 10 years: No If all of the above answers are "NO", then may proceed with Cephalosporin use.     Current Outpatient Prescriptions  Medication Sig Dispense Refill  . anastrozole (ARIMIDEX) 1 MG tablet TAKE 1 TABLET BY MOUTH ONCE DAILY 30 tablet 3  . benzonatate (TESSALON) 100 MG capsule Take by mouth 3 (three) times daily as needed for cough.    . benztropine (COGENTIN) 0.5 MG tablet Take 0.5 mg by mouth 2 (two) times daily.    . bumetanide (BUMEX) 2 MG tablet Take by mouth daily.    . cholecalciferol (VITAMIN D) 1000 units tablet Take 1,000 Units by mouth daily. Takes 4,000 units daily    . clindamycin (CLEOCIN) 300 MG capsule Take 600 mg by mouth once. Takes 2 hours before dental procedures  0  . Dexlansoprazole (DEXILANT) 30 MG capsule Take 30 mg by mouth daily.    . diclofenac sodium (VOLTAREN) 1 % GEL Apply 2 g topically 4 (four) times daily. 3 Tube 1  . docusate sodium (COLACE) 100 MG capsule Take 1 capsule (100 mg total)  by mouth every 12 (twelve) hours. 60 capsule 0  . DULoxetine (CYMBALTA) 60 MG capsule take 1 capsule by mouth once daily    . montelukast (SINGULAIR) 10 MG tablet Take 10 mg by mouth at bedtime.    . potassium chloride SA (K-DUR,KLOR-CON) 20 MEQ tablet Take 20 mEq by mouth daily.    . pregabalin (LYRICA) 100 MG capsule Take 1 capsule (100 mg total) by mouth 3 (three) times daily. 90 capsule 5  . promethazine (PHENERGAN) 25 MG tablet Take 25 mg by mouth every 4 (four) hours as needed for nausea or vomiting.    Marland Kitchen tiZANidine (ZANAFLEX) 4 MG tablet Take 4 mg by mouth every 6 (six) hours as needed for muscle spasms.    . traMADol (ULTRAM) 50 MG tablet TAKE 2 TABLETS BY MOUTH THREE TIMES DAILY AS NEEDED FOR MODERATE PAIN 180 tablet 5   No current facility-administered medications for this visit.     OBJECTIVE:  Vitals:   03/07/17 1345  BP: 131/70  Pulse: 84  Resp: 18  Temp: 98 F (36.7 C)  SpO2: 97%     Body mass index is 44.6 kg/m.    ECOG FS: 2 GENERAL: Patient is a well appearing female in no acute distress HEENT:  Sclerae anicteric.  Oropharynx clear and moist. No ulcerations or evidence of oropharyngeal candidiasis. Neck is supple.  NODES:  No cervical, supraclavicular, or axillary lymphadenopathy palpated.  BREAST EXAM:  Left breast s/p lumpectomy, scar tissue is well healed, no nodules, masses, skin or nipple changes, right breast no nodules, masses, skin or nipple changes, benign bilateral breast exam LUNGS:  Clear to auscultation bilaterally.  No wheezes or rhonchi. HEART:  Regular rate and rhythm. No murmur appreciated. ABDOMEN:  Soft, nontender.  Positive, normoactive bowel sounds. No organomegaly palpated. MSK:  No focal spinal tenderness to palpation. Full range of motion bilaterally in the upper extremities. EXTREMITIES:  No peripheral edema.   SKIN:  Clear with no obvious rashes or skin changes. No nail dyscrasia. NEURO:  Nonfocal. Well oriented.  Appropriate  affect.     LAB RESULTS: Lab Results  Component Value Date   WBC 4.4 03/07/2017   NEUTROABS 1.9 03/07/2017  HGB 13.6 03/07/2017   HCT 41.5 03/07/2017   MCV 86.8 03/07/2017   PLT 189 03/07/2017      Chemistry      Component Value Date/Time   NA 140 06/14/2016 0940   K 3.9 06/14/2016 0940   CL 99 (L) 05/09/2016 1507   CL 101 09/18/2012 0853   CO2 30 (H) 06/14/2016 0940   BUN 18.6 06/14/2016 0940   CREATININE 0.8 06/14/2016 0940      Component Value Date/Time   CALCIUM 9.5 06/14/2016 0940   ALKPHOS 92 06/14/2016 0940   AST 19 06/14/2016 0940   ALT 14 06/14/2016 0940   BILITOT 0.67 06/14/2016 0940      Lab Results  Component Value Date   LABCA2 41 (H) 06/13/2011    Urinalysis    Component Value Date/Time   COLORURINE YELLOW 09/02/2014 1044   APPEARANCEUR CLEAR 09/02/2014 1044   LABSPEC 1.012 09/02/2014 1044   PHURINE 6.0 09/02/2014 1044   GLUCOSEU NEGATIVE 09/02/2014 1044   HGBUR NEGATIVE 09/02/2014 1044   BILIRUBINUR NEGATIVE 09/02/2014 1044   KETONESUR NEGATIVE 09/02/2014 1044   PROTEINUR NEGATIVE 09/02/2014 1044   UROBILINOGEN 0.2 09/02/2014 1044   NITRITE NEGATIVE 09/02/2014 1044   LEUKOCYTESUR NEGATIVE 09/02/2014 1044    STUDIES: Ct Lumbar Spine W Contrast  Result Date: 03/05/2017 CLINICAL DATA:  Severe low back pain and bilateral leg pain in weakness. Symptoms are symmetric. EXAM: LUMBAR MYELOGRAM FLUOROSCOPY TIME:  1 minutes 29 seconds. 496.06 micro gray meter squared PROCEDURE: After thorough discussion of risks and benefits of the procedure including bleeding, infection, injury to nerves, blood vessels, adjacent structures as well as headache and CSF leak, written and oral informed consent was obtained. Consent was obtained by Dr. Nelson Chimes. Time out form was completed. Patient was positioned prone on the fluoroscopy table. Local anesthesia was provided with 1% lidocaine without epinephrine after prepped and draped in the usual sterile fashion.  Puncture was performed at L1-2 using a 3 1/2 inch 22-gauge spinal needle via right para median approach. Using a single pass through the dura, the needle was placed within the thecal sac, with return of clear CSF. 15 mL of Isovue 200 was injected into the thecal sac, with normal opacification of the nerve roots and cauda equina consistent with free flow within the subarachnoid space. I personally performed the lumbar puncture and administered the intrathecal contrast. I also personally performed acquisition of the myelogram images. TECHNIQUE: Contiguous axial images were obtained through the Lumbar spine after the intrathecal infusion of infusion. Coronal and sagittal reconstructions were obtained of the axial image sets. COMPARISON:  MRI 07/22/2016.  Myelography 11/01/2015. FINDINGS: LUMBAR MYELOGRAM FINDINGS: No significant finding at L2-3 or above. L3-4 shows 2 mm anterolisthesis with mild narrowing of the lateral recesses. L4-5 shows 4 mm of anterolisthesis with moderate multifactorial stenosis. L5-S1 is poorly seen because of diminished contrast below L4-5, but there does not appear to be compressive stenosis at that level. Standing views show a worsening of the anterolisthesis at L4-5 to about 6 mm and worsening of the stenosis at that level. 2 mm of anterolisthesis occurs at L5-S1. CT LUMBAR MYELOGRAM FINDINGS: T12-L1: Mild bulging of the disc. Mild facet arthritis. No stenosis. L1-2: Mild bulging of the disc. Mild facet arthritis. No stenosis. Conus tip at upper L2. L2-3: Mild bulging of the disc. Mild facet hypertrophy. No stenosis. L3-4: Mild bulging of the disc. Bilateral facet osteoarthritis. No anterolisthesis in this position. Mild anterolisthesis occurs in the prone position. No  compressive stenosis demonstrated. L4-5: Bilateral facet arthropathy. 2-3 mm of anterolisthesis, known to worsen with standing and flexion. Circumferential bulging of the disc, focally more prominent in the left extraforaminal  region. Potential for neural compression in the lateral recesses and neural foramina, left more than right. This appearance would worsen with standing or flexion. L5-S1: Bilateral facet arthropathy. Mild bulging of the disc. No apparent compressive canal stenosis. It appears that there may be developing or early fusion of the facet joints at this level. There is narrowing of the proximal foramina because of facet osteophytes. Bilateral sacroiliac osteoarthritis demonstrated. IMPRESSION: L3-4: Facet osteoarthritis. 2 mm anterolisthesis at this level demonstrated at myelography. No apparent compressive stenosis however. L4-5: Up to 6 mm of anterolisthesis with standing and flexion. Spinal stenosis at this level, particularly when the anterolisthesis occurs. Advanced bilateral facet arthropathy. Potential for neural compression in the lateral recesses and neural foramina, left more than right. I think the appearance at this level has worsened when compared to the study of 11/01/2015. L5-S1: Bilateral facet arthropathy, which may be developing bilateral facet fusion. No compressive canal stenosis. Some proximal foraminal narrowing because of facet osteophytic encroachment. Bilateral sacroiliac osteoarthritis. Electronically Signed   By: Nelson Chimes M.D.   On: 03/05/2017 12:37   Dg Myelography Lumbar Inj Lumbosacral  Result Date: 03/05/2017 CLINICAL DATA:  Severe low back pain and bilateral leg pain in weakness. Symptoms are symmetric. EXAM: LUMBAR MYELOGRAM FLUOROSCOPY TIME:  1 minutes 29 seconds. 496.06 micro gray meter squared PROCEDURE: After thorough discussion of risks and benefits of the procedure including bleeding, infection, injury to nerves, blood vessels, adjacent structures as well as headache and CSF leak, written and oral informed consent was obtained. Consent was obtained by Dr. Nelson Chimes. Time out form was completed. Patient was positioned prone on the fluoroscopy table. Local anesthesia was  provided with 1% lidocaine without epinephrine after prepped and draped in the usual sterile fashion. Puncture was performed at L1-2 using a 3 1/2 inch 22-gauge spinal needle via right para median approach. Using a single pass through the dura, the needle was placed within the thecal sac, with return of clear CSF. 15 mL of Isovue 200 was injected into the thecal sac, with normal opacification of the nerve roots and cauda equina consistent with free flow within the subarachnoid space. I personally performed the lumbar puncture and administered the intrathecal contrast. I also personally performed acquisition of the myelogram images. TECHNIQUE: Contiguous axial images were obtained through the Lumbar spine after the intrathecal infusion of infusion. Coronal and sagittal reconstructions were obtained of the axial image sets. COMPARISON:  MRI 07/22/2016.  Myelography 11/01/2015. FINDINGS: LUMBAR MYELOGRAM FINDINGS: No significant finding at L2-3 or above. L3-4 shows 2 mm anterolisthesis with mild narrowing of the lateral recesses. L4-5 shows 4 mm of anterolisthesis with moderate multifactorial stenosis. L5-S1 is poorly seen because of diminished contrast below L4-5, but there does not appear to be compressive stenosis at that level. Standing views show a worsening of the anterolisthesis at L4-5 to about 6 mm and worsening of the stenosis at that level. 2 mm of anterolisthesis occurs at L5-S1. CT LUMBAR MYELOGRAM FINDINGS: T12-L1: Mild bulging of the disc. Mild facet arthritis. No stenosis. L1-2: Mild bulging of the disc. Mild facet arthritis. No stenosis. Conus tip at upper L2. L2-3: Mild bulging of the disc. Mild facet hypertrophy. No stenosis. L3-4: Mild bulging of the disc. Bilateral facet osteoarthritis. No anterolisthesis in this position. Mild anterolisthesis occurs in the prone position.  No compressive stenosis demonstrated. L4-5: Bilateral facet arthropathy. 2-3 mm of anterolisthesis, known to worsen with  standing and flexion. Circumferential bulging of the disc, focally more prominent in the left extraforaminal region. Potential for neural compression in the lateral recesses and neural foramina, left more than right. This appearance would worsen with standing or flexion. L5-S1: Bilateral facet arthropathy. Mild bulging of the disc. No apparent compressive canal stenosis. It appears that there may be developing or early fusion of the facet joints at this level. There is narrowing of the proximal foramina because of facet osteophytes. Bilateral sacroiliac osteoarthritis demonstrated. IMPRESSION: L3-4: Facet osteoarthritis. 2 mm anterolisthesis at this level demonstrated at myelography. No apparent compressive stenosis however. L4-5: Up to 6 mm of anterolisthesis with standing and flexion. Spinal stenosis at this level, particularly when the anterolisthesis occurs. Advanced bilateral facet arthropathy. Potential for neural compression in the lateral recesses and neural foramina, left more than right. I think the appearance at this level has worsened when compared to the study of 11/01/2015. L5-S1: Bilateral facet arthropathy, which may be developing bilateral facet fusion. No compressive canal stenosis. Some proximal foraminal narrowing because of facet osteophytic encroachment. Bilateral sacroiliac osteoarthritis. Electronically Signed   By: Nelson Chimes M.D.   On: 03/05/2017 12:37      ASSESSMENT: 56 y.o. [place], [place] woman:  (1) Status post left breast biopsy 06/01/2011 for a cT2 pN1, stage IIB invasive ductal carcinoma, grade 3, estrogen receptor 80% and progesterone receptor 9% positive, with no HER-2 amplification and an MIB-1 of 100%  (2) Treated neoadjuvantly with 4 cycles of cyclophosphamide, epirubicin and fluorouracil, followed by 4 cycles of docetaxel, completed 11/01/2011  (3) Status post left lumpectomy and axillary lymph node resection 11/28/2011, showing a complete pathologic  response (no residual invasive or in situ cancer in the breast and 0 of 17 lymph nodes involved)  (4) Adjuvant radiation therapy completed 03/31/2012  (5) Started anastrozole November 2013;osteopenia on bone density 05/2016 with t score of -1.3 in the right femur  (6) chronic Left upper extremity lymphedema  (7) fibromyalgia and osteoarthritis with polyarthralgia  PLAN:   Chrisette is doing moderately well.  She will continue taking Anastrozole as she is tolerating it well.  I cannot find an explanation for her less than 1 day old breast pain.  I reviewed with her to take tylenol and if it continues to return back to see me and we will order the appropriate imaging.  I counseled her on healthy diet, exercise, and calcium/vitamin d supplementation.  She verbalized understanding. She has no sign of breast cancer recurrence.  She will return in 6 months for f/u with Dr. Jana Hakim.    -F/u in 6 months with Dr. Jana Hakim -Mammogram due in 05/2017, ordered today -Bone density due in 05/2018  Larua knows to call between now and her next appointment for any questions or concerns.    A total of (30) minutes of face-to-face time was spent with this patient with greater than 50% of that time in counseling and care-coordination.   Scot Dock, NP   03/07/2017   1:59 PM

## 2017-03-07 NOTE — Telephone Encounter (Signed)
Printed avs and calender per 10/4 los

## 2017-03-08 ENCOUNTER — Telehealth: Payer: Self-pay

## 2017-03-08 LAB — VITAMIN D 25 HYDROXY (VIT D DEFICIENCY, FRACTURES): Vitamin D, 25-Hydroxy: 19.9 ng/mL — ABNORMAL LOW (ref 30.0–100.0)

## 2017-03-08 NOTE — Telephone Encounter (Signed)
Returning a call from Dr. Sima Matas.

## 2017-03-11 LAB — DRUG TOX MONITOR 1 W/CONF, ORAL FLD
Amphetamines: NEGATIVE ng/mL (ref <10)
Barbiturates: NEGATIVE ng/mL (ref <10)
Benzodiazepines: NEGATIVE ng/mL (ref <0.50)
Buprenorphine: NEGATIVE ng/mL (ref <0.025)
Cocaine: NEGATIVE ng/mL (ref <2.5)
Fentanyl: NEGATIVE ng/mL (ref <0.10)
Heroin Metabolite: NEGATIVE ng/mL (ref <1.0)
MARIJUANA: NEGATIVE ng/mL (ref <2.5)
MDMA: NEGATIVE ng/mL (ref <10)
Meperidine: NEGATIVE ng/mL (ref <5.0)
Meprobamate: NEGATIVE ng/mL (ref <2.5)
Methadone: NEGATIVE ng/mL (ref <5.0)
Nicotine Metabolite: NEGATIVE ng/mL (ref <5.0)
Opiates: NEGATIVE ng/mL (ref <2.5)
Phencyclidine: NEGATIVE ng/mL (ref <10)
Propoxyphene: NEGATIVE ng/mL (ref <5.0)
Tapentadol: NEGATIVE ng/mL (ref <5.0)
Tramadol: 39.5 ng/mL — ABNORMAL HIGH (ref <5.0)
Tramadol: POSITIVE ng/mL — AB (ref <5.0)
Zolpidem: NEGATIVE ng/mL (ref <5.0)

## 2017-03-11 LAB — DRUG TOX ALC METAB W/CON, ORAL FLD: Alcohol Metabolite: NEGATIVE ng/mL (ref <25)

## 2017-03-13 ENCOUNTER — Telehealth: Payer: Self-pay | Admitting: *Deleted

## 2017-03-13 NOTE — Telephone Encounter (Signed)
Oral swab drug screen was consistent for prescribed medications.  ?

## 2017-03-13 NOTE — Telephone Encounter (Signed)
error 

## 2017-03-14 ENCOUNTER — Other Ambulatory Visit: Payer: Self-pay | Admitting: Physical Medicine & Rehabilitation

## 2017-03-14 DIAGNOSIS — M180 Bilateral primary osteoarthritis of first carpometacarpal joints: Secondary | ICD-10-CM | POA: Insufficient documentation

## 2017-03-18 MED FILL — LYRICA 100 MG CAPSULE: 100 | 30 days supply | Qty: 90 | Fill #0

## 2017-03-25 MED FILL — traMADol HCL 50 MG TABS: 50 | 30 days supply | Qty: 180 | Fill #1

## 2017-03-25 MED FILL — ANASTROZOLE 1 MG TABLET: 1 | 30 days supply | Qty: 30 | Fill #0

## 2017-03-27 ENCOUNTER — Other Ambulatory Visit: Payer: Self-pay | Admitting: *Deleted

## 2017-03-27 ENCOUNTER — Telehealth: Payer: Self-pay | Admitting: *Deleted

## 2017-03-27 MED ORDER — ERGOCALCIFEROL 1.25 MG (50000 UT) PO CAPS: 50000.0000 [IU] | ORAL_CAPSULE | ORAL | 2 refills | Status: DC

## 2017-03-27 NOTE — Telephone Encounter (Signed)
This RN attempted to reach pt per labs obtained at visit early October- and need for vitamin D therapy in addition to current supplement.  Per [facility]/NP - pt should start 50,000 units weekly x 12 weeks.  Need to verify pharmacy and pt's understanding of above recommendation prior to ordering.  Noted mild decrease in potassium with recommendation for pt to increase potassium in her diet.  This RN obtained VM - message left requesting a return call to discuss above.

## 2017-03-27 NOTE — Telephone Encounter (Signed)
Pt returned call - lab results discussed and pharmacy verified.

## 2017-03-28 ENCOUNTER — Other Ambulatory Visit: Payer: Self-pay | Admitting: Neurological Surgery

## 2017-03-28 MED FILL — VIT D2 1.25 MG (50,000 UNIT: 1.25 MG | 84 days supply | Qty: 12 | Fill #0

## 2017-04-22 MED FILL — ANASTROZOLE 1 MG TABLET: 1 | 30 days supply | Qty: 30 | Fill #1

## 2017-04-22 MED FILL — LYRICA 100 MG CAPSULE: 100 | 30 days supply | Qty: 90 | Fill #1

## 2017-05-22 MED FILL — ANASTROZOLE 1 MG TABLET: 1 | 30 days supply | Qty: 30 | Fill #2

## 2017-05-24 MED FILL — LYRICA 100 MG CAPSULE: 100 | 30 days supply | Qty: 90 | Fill #2

## 2017-05-24 MED FILL — traMADol HCL 50 MG TABS: 50 | 30 days supply | Qty: 180 | Fill #2

## 2017-05-27 NOTE — Pre-Procedure Instructions (Signed)
Kelsey Wilson  05/27/2017      [id number], [place] [address] [address] Phone: [phone] Fax: [phone]    Your procedure is scheduled on Jan 4   Report to [facility] at 530 A.M.  Call this number if you have problems the morning of surgery:  [phone]   Remember:  Do not eat food or drink liquids after midnight.  Take these medicines the morning of surgery with A SIP OF WATER Anastorzole (Arimidex), Dexlansoprazole (Dexilant), Duloxetine (Cymbalta), Ondansetron (Zofran) if needed, Pregabalin (Lyrica), Tizanidine (Zanaflex) if needed, Tramadol (Ultram) If needed, Ingrezza  Stop taking aspirin, BC's, Goody's, Herbal medications, Fish Oil, Aleve, Ibuprofen, Advil, Motrin, Vitamins   Do not wear jewelry, make-up or nail polish.  Do not wear lotions, powders, or perfumes, or deodorant.  Do not shave 48 hours prior to surgery.  Men may shave face and neck.  Do not bring valuables to the hospital.  [facility] is not responsible for any belongings or valuables.  Contacts, dentures or bridgework may not be worn into surgery.  Leave your suitcase in the car.  After surgery it may be brought to your room.  For patients admitted to the hospital, discharge time will be determined by your treatment team.  Patients discharged the day of surgery will not be allowed to drive home.    Special instructions:   - Preparing for Surgery  Before surgery, you can play an important role.  Because skin is not sterile, your skin needs to be as free of germs as possible.  You can reduce the number of germs on you skin by washing with CHG (chlorahexidine gluconate) soap before surgery.  CHG is an antiseptic cleaner which kills germs and bonds with the skin to continue killing germs even after washing.  Please DO NOT use if you have an allergy to CHG or antibacterial soaps.  If your skin  becomes reddened/irritated stop using the CHG and inform your nurse when you arrive at Short Stay.  Do not shave (including legs and underarms) for at least 48 hours prior to the first CHG shower.  You may shave your face.  Please follow these instructions carefully:   1.  Shower with CHG Soap the night before surgery and the   morning of Surgery.  2.  If you choose to wash your hair, wash your hair first as usual with your  normal shampoo.  3.  After you shampoo, rinse your hair and body thoroughly to remove the Shampoo.  4.  Use CHG as you would any other liquid soap.  You can apply chg directly to the skin and wash gently with scrungie or a clean washcloth.  5.  Apply the CHG Soap to your body ONLY FROM THE NECK DOWN.   Do not use on open wounds or open sores.  Avoid contact with your eyes,  ears, mouth and genitals (private parts).  Wash genitals (private parts)  with your normal soap.  6.  Wash thoroughly, paying special attention to the area where your surgery  will be performed.  7.  Thoroughly rinse your body with warm water from the neck down.  8.  DO NOT shower/wash with your normal soap after using and rinsing off   the CHG Soap.  9.  Pat yourself dry with a clean towel.            10.  Wear clean pajamas.            11.  Place clean sheets on your bed the night of your first shower and do not sleep with pets.  Day of Surgery  Do not apply any lotions/deoderants the morning of surgery.  Please wear clean clothes to the hospital/surgery center.     Please read over the following fact sheets that you were given. Pain Booklet, Coughing and Deep Breathing, MRSA Information and Surgical Site Infection Prevention

## 2017-05-29 ENCOUNTER — Encounter (HOSPITAL_COMMUNITY)
Admission: RE | Admit: 2017-05-29 | Discharge: 2017-05-29 | Disposition: A | Discharge status: Home / Self Care | Payer: Medicare Other | Source: Ambulatory Visit | Attending: Neurological Surgery | Admitting: Neurological Surgery

## 2017-05-29 ENCOUNTER — Other Ambulatory Visit: Payer: Self-pay

## 2017-05-29 ENCOUNTER — Ambulatory Visit (HOSPITAL_COMMUNITY)
Admission: RE | Admit: 2017-05-29 | Discharge: 2017-05-29 | Disposition: A | Discharge status: Home / Self Care | Payer: Medicare Other | Source: Ambulatory Visit | Attending: Neurological Surgery | Admitting: Neurological Surgery

## 2017-05-29 ENCOUNTER — Encounter (HOSPITAL_COMMUNITY): Payer: Self-pay

## 2017-05-29 DIAGNOSIS — M4310 Spondylolisthesis, site unspecified: Secondary | ICD-10-CM | POA: Diagnosis not present

## 2017-05-29 DIAGNOSIS — R9431 Abnormal electrocardiogram [ECG] [EKG]: Secondary | ICD-10-CM | POA: Insufficient documentation

## 2017-05-29 DIAGNOSIS — Z01810 Encounter for preprocedural cardiovascular examination: Secondary | ICD-10-CM | POA: Diagnosis not present

## 2017-05-29 HISTORY — DX: Dyspnea, unspecified: R06.00

## 2017-05-29 LAB — BASIC METABOLIC PANEL
Anion gap: 10 (ref 5–15)
BUN: 13 mg/dL (ref 6–20)
CO2: 29 mmol/L (ref 22–32)
Calcium: 9.7 mg/dL (ref 8.9–10.3)
Chloride: 98 mmol/L — ABNORMAL LOW (ref 101–111)
Creatinine, Ser: 0.9 mg/dL (ref 0.44–1.00)
GFR calc Af Amer: >60 mL/min (ref >60)
GFR calc non Af Amer: >60 mL/min (ref >60)
Glucose, Bld: 124 mg/dL — ABNORMAL HIGH (ref 65–99)
Potassium: 3.5 mmol/L (ref 3.5–5.1)
Sodium: 137 mmol/L (ref 135–145)

## 2017-05-29 LAB — CBC WITH DIFFERENTIAL/PLATELET
Basophils Absolute: 0 10*3/uL (ref 0.0–0.1)
Basophils Relative: 0 %
Eosinophils Absolute: 0.1 10*3/uL (ref 0.0–0.7)
Eosinophils Relative: 3 %
HCT: 35.1 % — ABNORMAL LOW (ref 36.0–46.0)
Hemoglobin: 11.7 g/dL — ABNORMAL LOW (ref 12.0–15.0)
Lymphocytes Relative: 42 %
Lymphs Abs: 1.6 10*3/uL (ref 0.7–4.0)
MCH: 28.8 pg (ref 26.0–34.0)
MCHC: 33.3 g/dL (ref 30.0–36.0)
MCV: 86.5 fL (ref 78.0–100.0)
Monocytes Absolute: 0.3 10*3/uL (ref 0.1–1.0)
Monocytes Relative: 8 %
Neutro Abs: 1.8 10*3/uL (ref 1.7–7.7)
Neutrophils Relative %: 47 %
Platelets: 201 10*3/uL (ref 150–400)
RBC: 4.06 MIL/uL (ref 3.87–5.11)
RDW: 13.9 % (ref 11.5–15.5)
WBC: 3.8 10*3/uL — ABNORMAL LOW (ref 4.0–10.5)

## 2017-05-29 LAB — PROTIME-INR
INR: 0.89
Prothrombin Time: 11.9 seconds (ref 11.4–15.2)

## 2017-05-29 LAB — TYPE AND SCREEN
ABO/RH(D): O POS
Antibody Screen: NEGATIVE

## 2017-05-29 LAB — SURGICAL PCR SCREEN
MRSA, PCR: NEGATIVE
Staphylococcus aureus: NEGATIVE

## 2017-05-29 IMAGING — CR DG CHEST 2V
2 series · 2 of 2 positions shown · non-contrast
Comparison: February 16, 2016

CLINICAL DATA: Preoperative evaluation for spinal surgery. History
of breast carcinoma

EXAM:
CHEST  2 VIEW

[w chest pa]
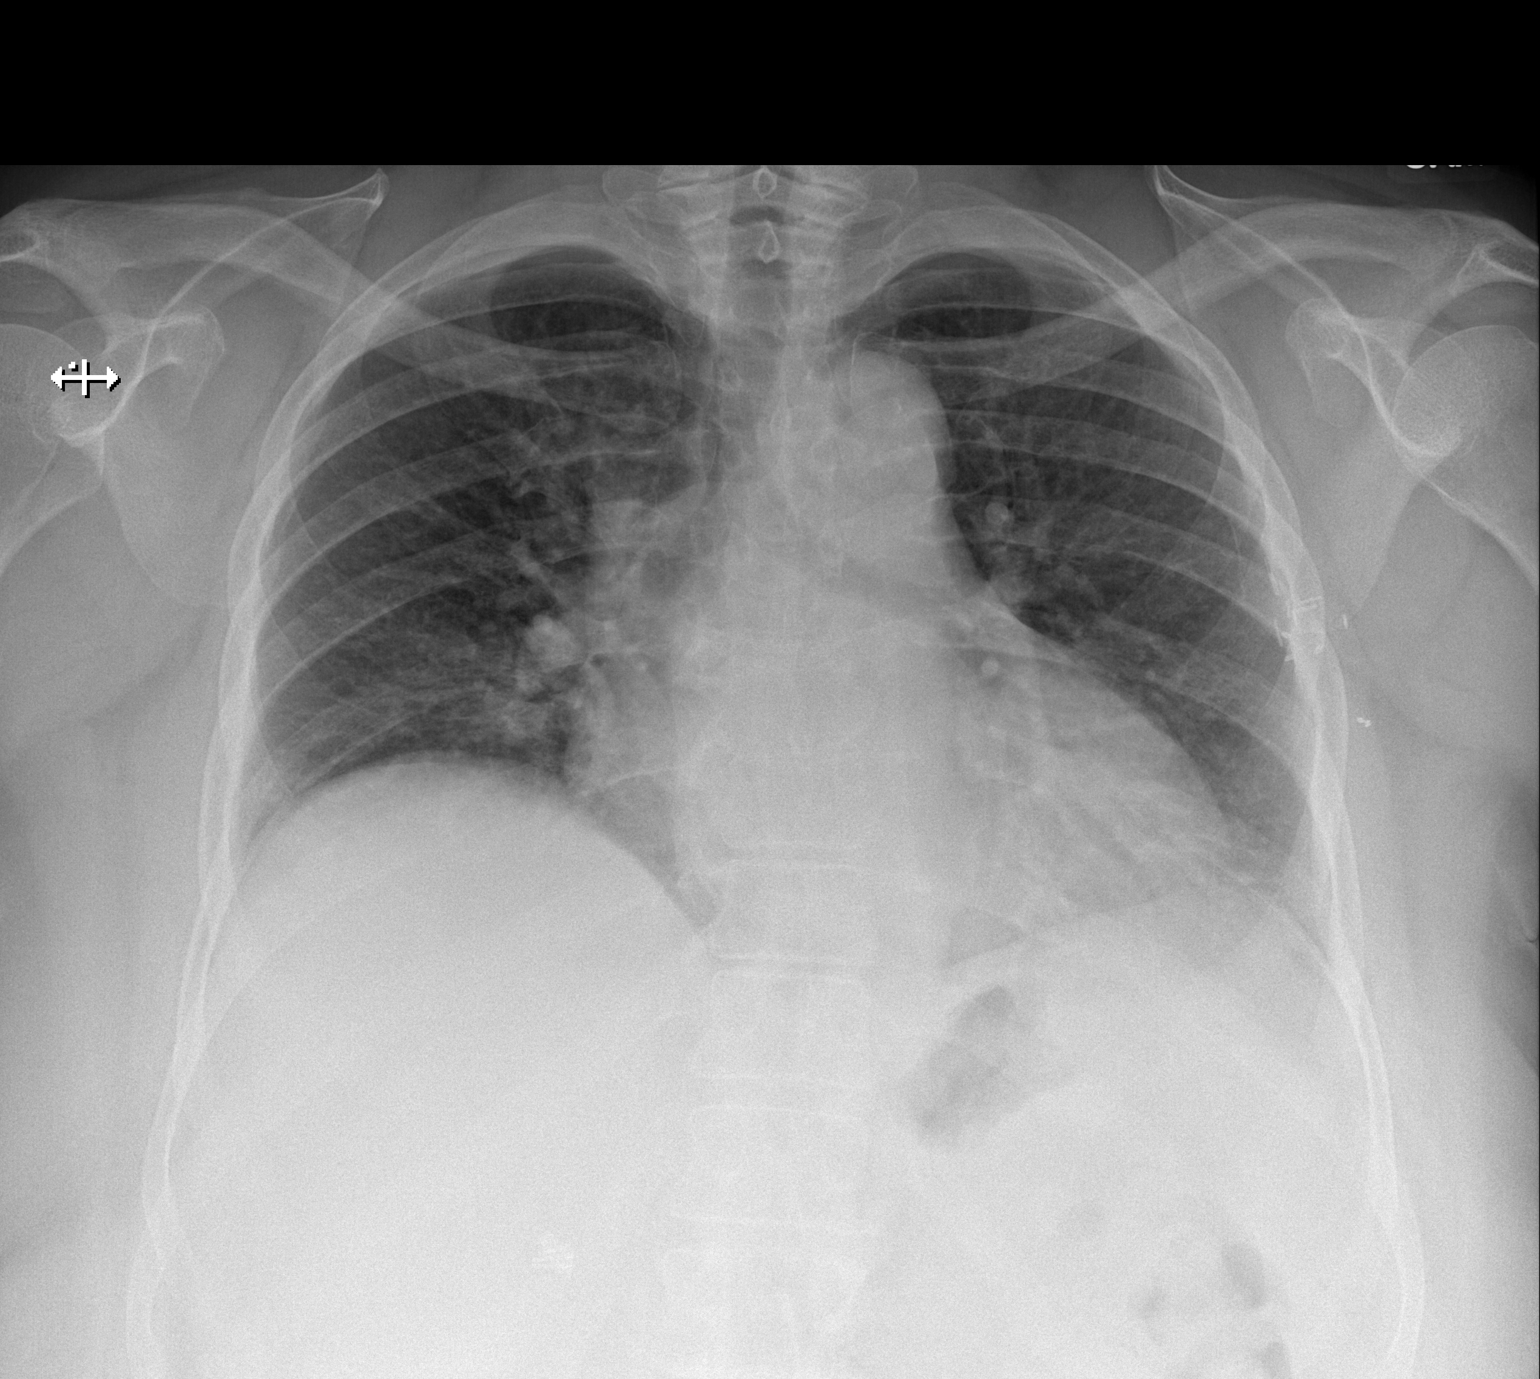

[w chest lat]
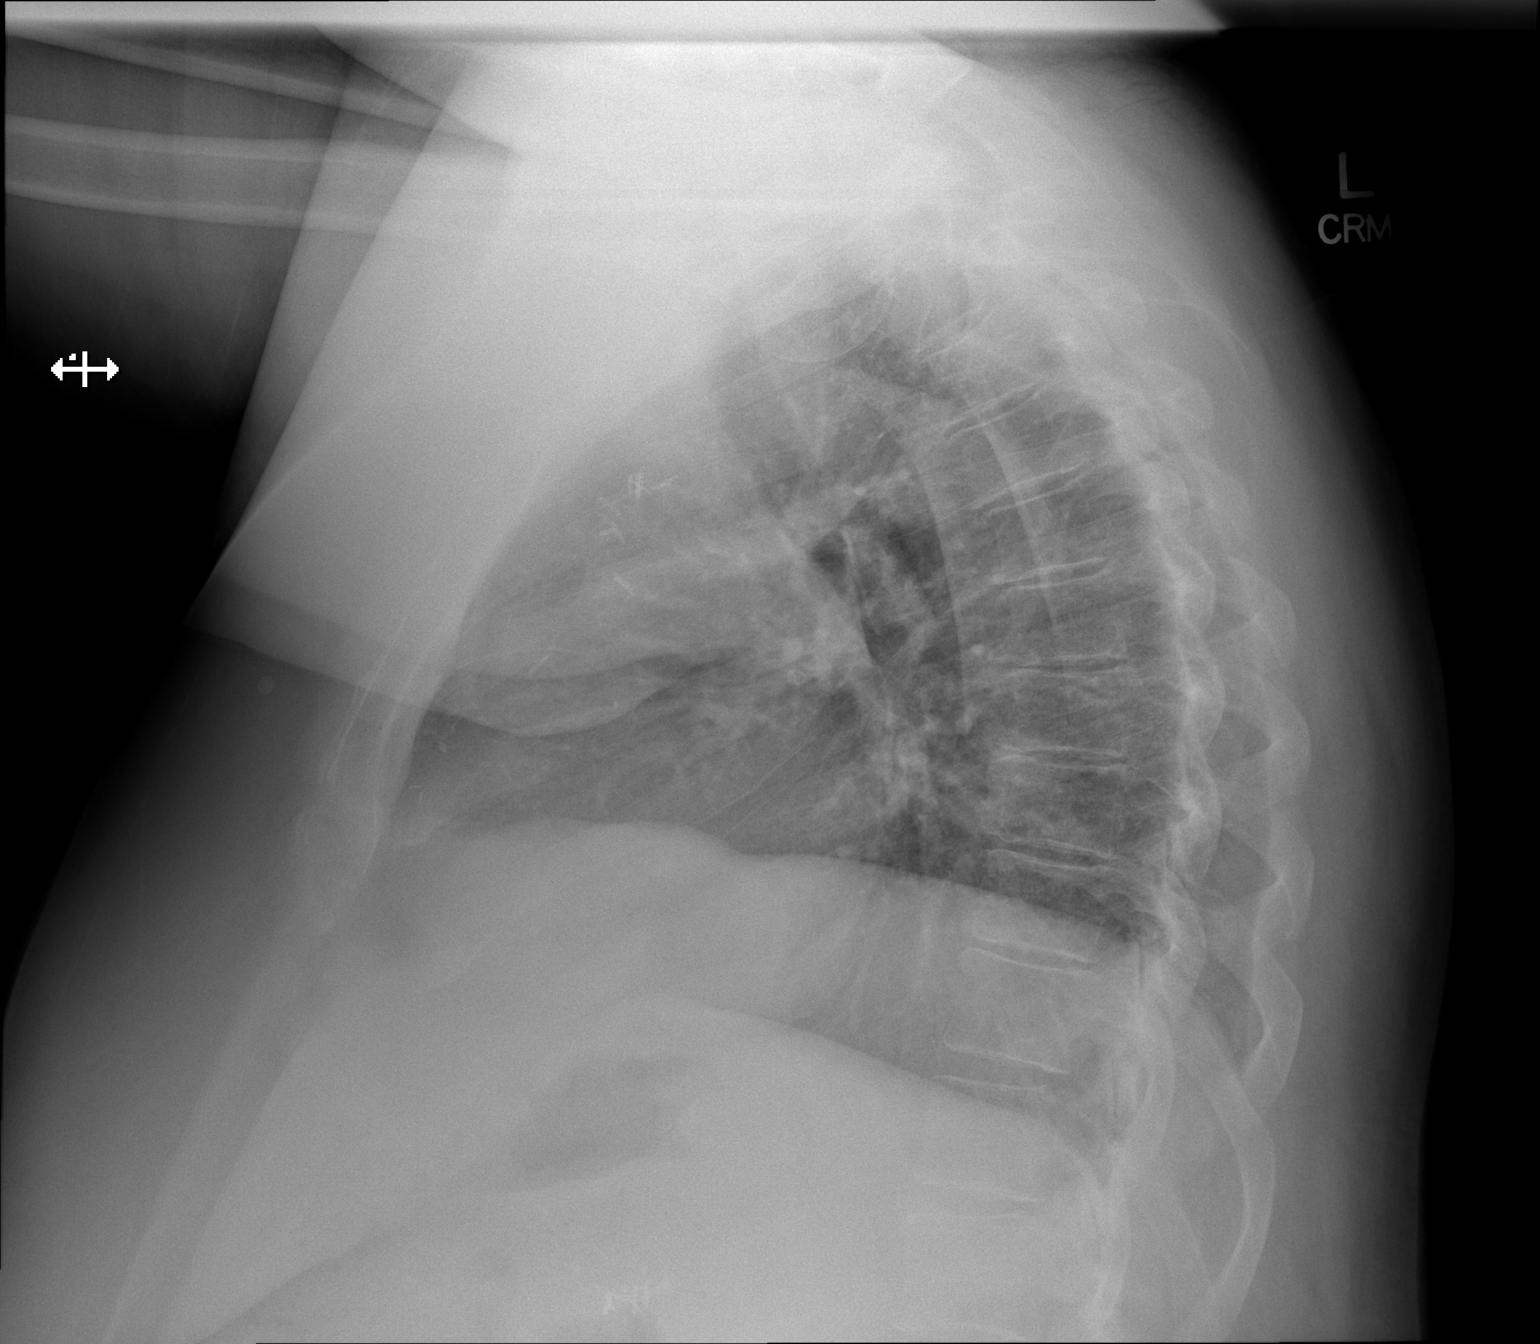

[2 of 2 positions shown; findings below may reference images not displayed]

FINDINGS: There is no edema or consolidation. Heart is borderline enlarged
with pulmonary vascularity within normal limits. No adenopathy.
There are surgical clips in left axillary region. There are no
blastic or lytic bone lesions.
IMPRESSION: Heart borderline enlarged.  No edema or consolidation.

## 2017-05-29 NOTE — Progress Notes (Signed)
PCP: Dr. Alphonzo Grieve

## 2017-05-31 ENCOUNTER — Ambulatory Visit
Admission: RE | Admit: 2017-05-31 | Discharge: 2017-05-31 | Disposition: A | Discharge status: Home / Self Care | Payer: Medicare Other | Source: Ambulatory Visit | Attending: Adult Health | Admitting: Adult Health

## 2017-05-31 DIAGNOSIS — Z170 Estrogen receptor positive status [ER+]: Secondary | ICD-10-CM

## 2017-05-31 DIAGNOSIS — C50412 Malignant neoplasm of upper-outer quadrant of left female breast: Secondary | ICD-10-CM

## 2017-05-31 HISTORY — DX: Personal history of antineoplastic chemotherapy: Z92.21

## 2017-05-31 HISTORY — DX: Personal history of irradiation: Z92.3

## 2017-06-06 MED ORDER — VANCOMYCIN HCL 10 G IV SOLR
1500.0000 mg | INTRAVENOUS | Status: AC
  Administered 2017-06-07: 1500 mg via INTRAVENOUS
  Filled 2017-06-06: qty 1500

## 2017-06-06 MED ORDER — DEXAMETHASONE SODIUM PHOSPHATE 10 MG/ML IJ SOLN
10.0000 mg | INTRAMUSCULAR | Status: AC
  Administered 2017-06-07: 10 mg via INTRAVENOUS
  Filled 2017-06-06: qty 1

## 2017-06-07 ENCOUNTER — Encounter (HOSPITAL_COMMUNITY)
Admission: RE | Disposition: A | Discharge status: Home Health | Payer: Self-pay | Source: Home / Self Care | Attending: Neurological Surgery

## 2017-06-07 ENCOUNTER — Inpatient Hospital Stay (HOSPITAL_COMMUNITY)
Admission: RE | Admit: 2017-06-07 | Discharge: 2017-06-12 | DRG: 460 | Disposition: A | Discharge status: Home Health | Payer: Medicare Other | Attending: Neurological Surgery | Admitting: Neurological Surgery

## 2017-06-07 ENCOUNTER — Inpatient Hospital Stay (HOSPITAL_COMMUNITY): Payer: Medicare Other | Admitting: Certified Registered"

## 2017-06-07 ENCOUNTER — Other Ambulatory Visit: Payer: Self-pay

## 2017-06-07 ENCOUNTER — Inpatient Hospital Stay (HOSPITAL_COMMUNITY): Payer: Medicare Other

## 2017-06-07 ENCOUNTER — Encounter (HOSPITAL_COMMUNITY): Payer: Self-pay | Admitting: Surgery

## 2017-06-07 DIAGNOSIS — M48061 Spinal stenosis, lumbar region without neurogenic claudication: Secondary | ICD-10-CM | POA: Diagnosis present

## 2017-06-07 DIAGNOSIS — M4316 Spondylolisthesis, lumbar region: Secondary | ICD-10-CM | POA: Diagnosis present

## 2017-06-07 DIAGNOSIS — Z880 Allergy status to penicillin: Secondary | ICD-10-CM

## 2017-06-07 DIAGNOSIS — Z79811 Long term (current) use of aromatase inhibitors: Secondary | ICD-10-CM

## 2017-06-07 DIAGNOSIS — Z90710 Acquired absence of both cervix and uterus: Secondary | ICD-10-CM

## 2017-06-07 DIAGNOSIS — F419 Anxiety disorder, unspecified: Secondary | ICD-10-CM | POA: Diagnosis present

## 2017-06-07 DIAGNOSIS — Z79899 Other long term (current) drug therapy: Secondary | ICD-10-CM

## 2017-06-07 DIAGNOSIS — Z419 Encounter for procedure for purposes other than remedying health state, unspecified: Secondary | ICD-10-CM

## 2017-06-07 DIAGNOSIS — Z886 Allergy status to analgesic agent status: Secondary | ICD-10-CM | POA: Diagnosis not present

## 2017-06-07 DIAGNOSIS — K219 Gastro-esophageal reflux disease without esophagitis: Secondary | ICD-10-CM | POA: Diagnosis present

## 2017-06-07 DIAGNOSIS — Z885 Allergy status to narcotic agent status: Secondary | ICD-10-CM

## 2017-06-07 DIAGNOSIS — Z888 Allergy status to other drugs, medicaments and biological substances status: Secondary | ICD-10-CM | POA: Diagnosis not present

## 2017-06-07 DIAGNOSIS — Z8619 Personal history of other infectious and parasitic diseases: Secondary | ICD-10-CM

## 2017-06-07 DIAGNOSIS — Z853 Personal history of malignant neoplasm of breast: Secondary | ICD-10-CM | POA: Diagnosis not present

## 2017-06-07 DIAGNOSIS — G8929 Other chronic pain: Secondary | ICD-10-CM | POA: Diagnosis present

## 2017-06-07 DIAGNOSIS — G629 Polyneuropathy, unspecified: Secondary | ICD-10-CM | POA: Diagnosis present

## 2017-06-07 DIAGNOSIS — F329 Major depressive disorder, single episode, unspecified: Secondary | ICD-10-CM | POA: Diagnosis present

## 2017-06-07 DIAGNOSIS — Z86010 Personal history of colonic polyps: Secondary | ICD-10-CM | POA: Diagnosis not present

## 2017-06-07 DIAGNOSIS — Z9221 Personal history of antineoplastic chemotherapy: Secondary | ICD-10-CM | POA: Diagnosis not present

## 2017-06-07 DIAGNOSIS — Y838 Other surgical procedures as the cause of abnormal reaction of the patient, or of later complication, without mention of misadventure at the time of the procedure: Secondary | ICD-10-CM | POA: Diagnosis present

## 2017-06-07 DIAGNOSIS — G4733 Obstructive sleep apnea (adult) (pediatric): Secondary | ICD-10-CM | POA: Diagnosis present

## 2017-06-07 DIAGNOSIS — M960 Pseudarthrosis after fusion or arthrodesis: Secondary | ICD-10-CM | POA: Diagnosis present

## 2017-06-07 DIAGNOSIS — Z923 Personal history of irradiation: Secondary | ICD-10-CM

## 2017-06-07 DIAGNOSIS — J302 Other seasonal allergic rhinitis: Secondary | ICD-10-CM | POA: Diagnosis present

## 2017-06-07 DIAGNOSIS — M797 Fibromyalgia: Secondary | ICD-10-CM | POA: Diagnosis present

## 2017-06-07 DIAGNOSIS — M545 Low back pain: Secondary | ICD-10-CM | POA: Diagnosis present

## 2017-06-07 DIAGNOSIS — Z6841 Body Mass Index (BMI) 40.0 and over, adult: Secondary | ICD-10-CM | POA: Diagnosis not present

## 2017-06-07 DIAGNOSIS — Z981 Arthrodesis status: Secondary | ICD-10-CM

## 2017-06-07 HISTORY — PX: LUMBAR FUSION: SHX111

## 2017-06-07 HISTORY — PX: LAMINECTOMY WITH POSTERIOR LATERAL ARTHRODESIS LEVEL 2: SHX6336

## 2017-06-07 IMAGING — RF DG LUMBAR SPINE 2-3V
1 series · 3 of 3 positions shown · non-contrast
Comparison: None.

CLINICAL DATA: Posterolateral fusion lumbar 3-4 and interbody
fusion lumbar 4-5

EXAM:
DG C-ARM 61-120 MIN; LUMBAR SPINE - 2-3 VIEW

[Series 1: run · 3 of 3 slices shown]
[im 1/3]
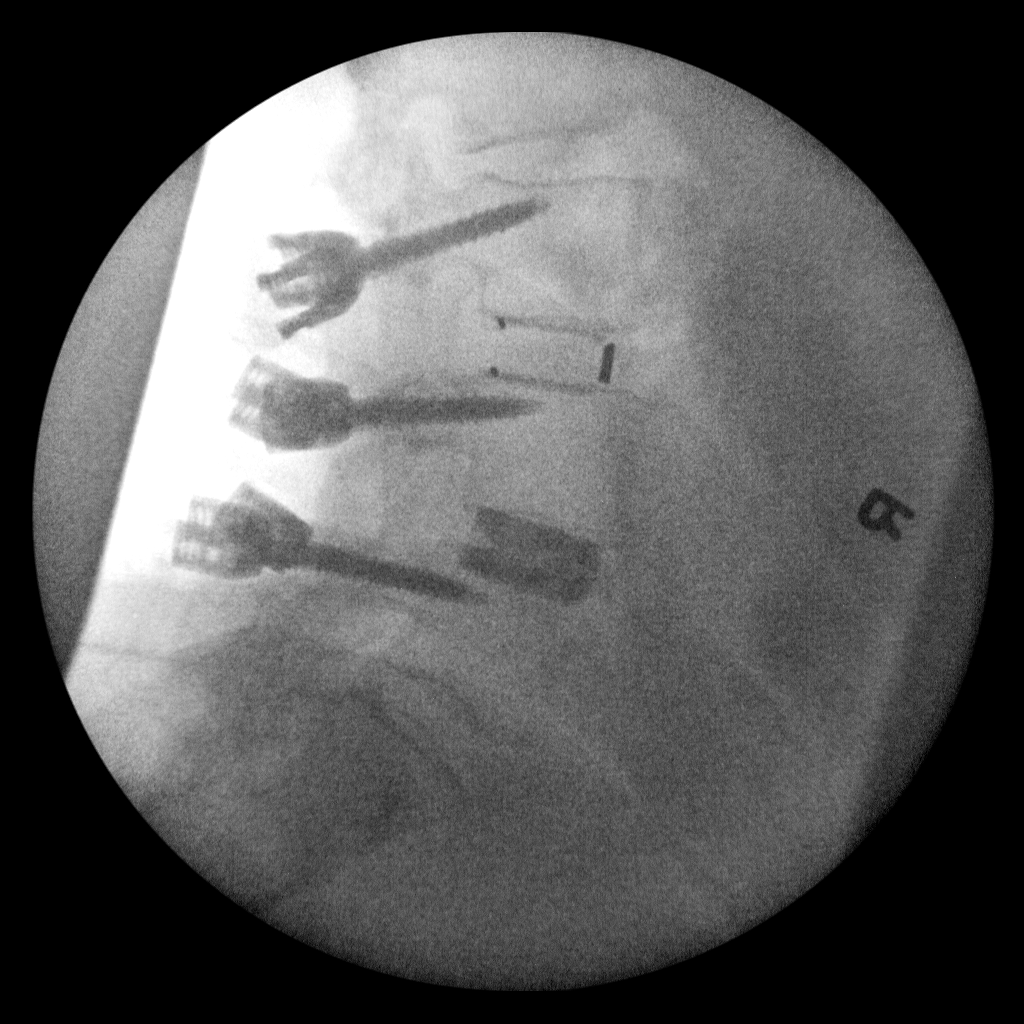
[im 2/3]
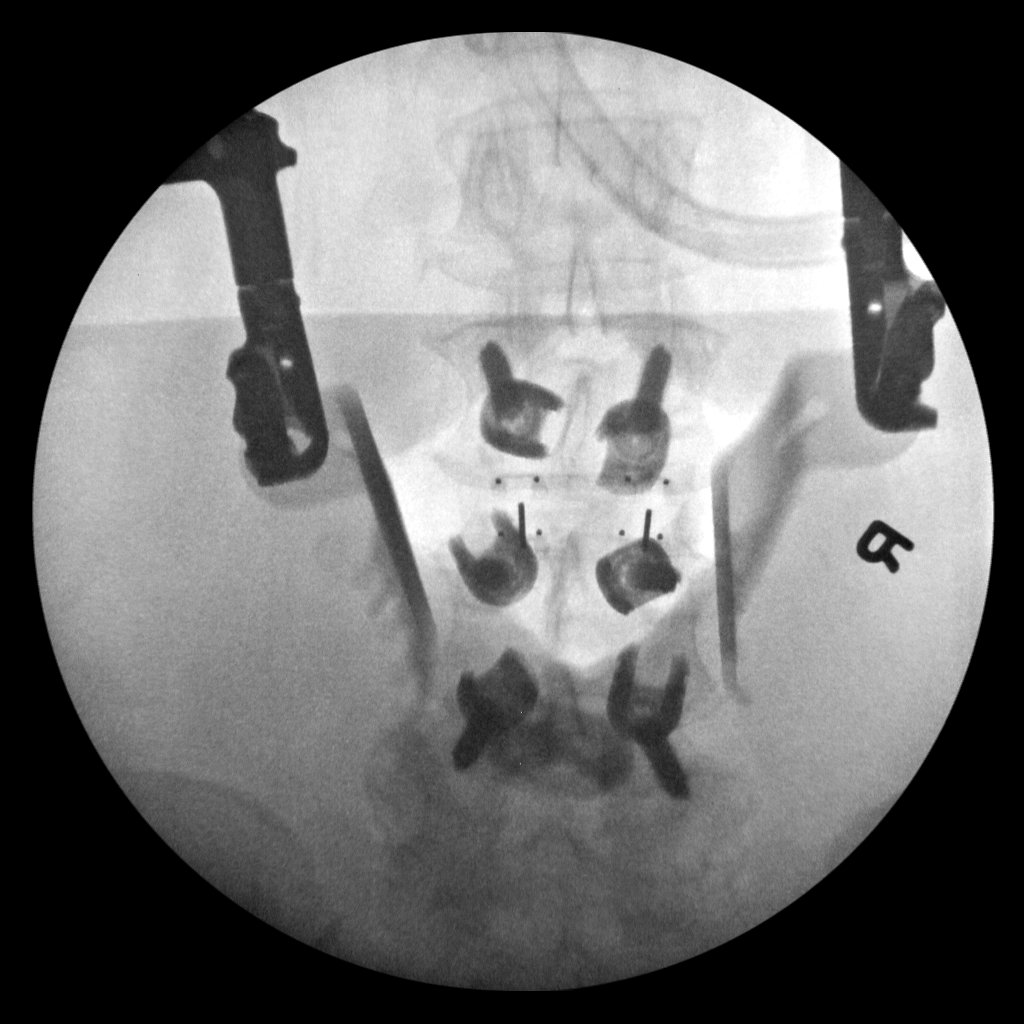
[im 3/3]
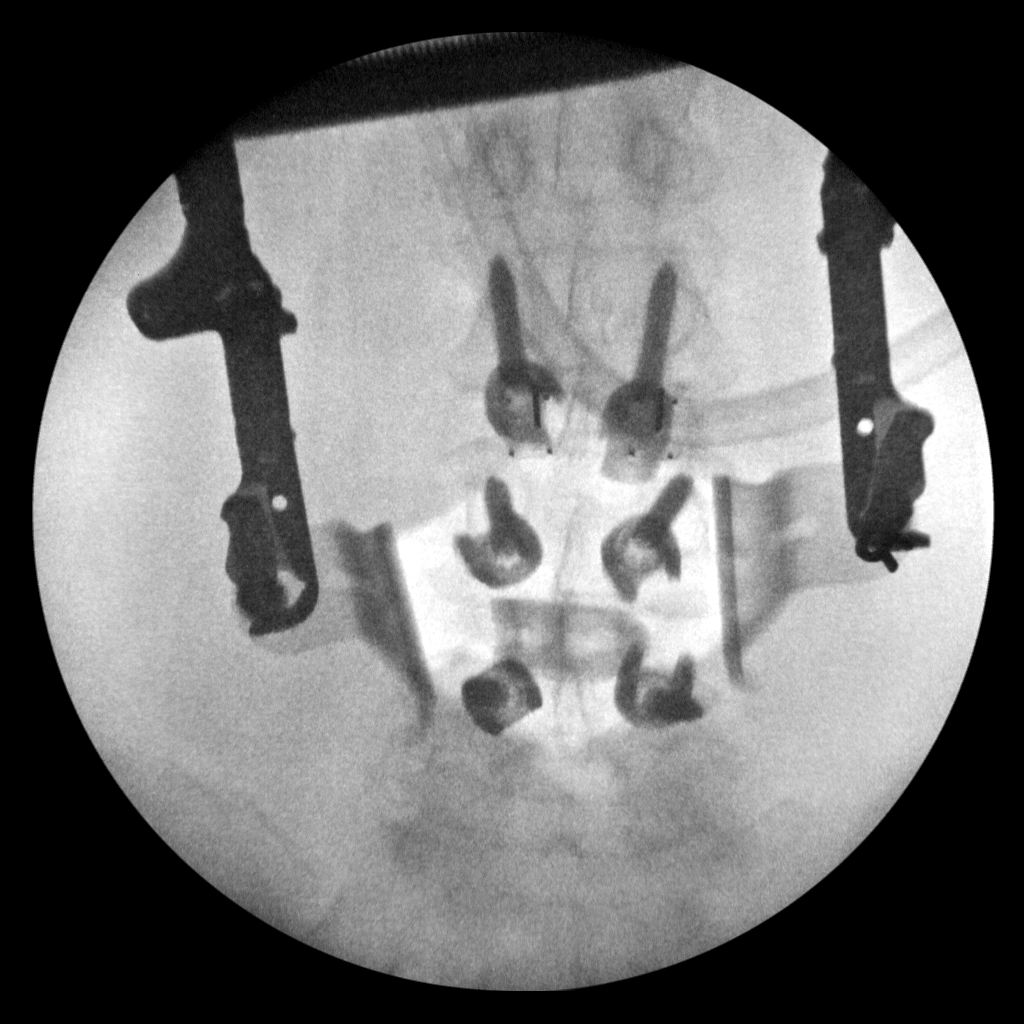

[3 of 3 positions shown; findings below may reference images not displayed]

FINDINGS: AP and lateral intraoperative fluoroscopic images demonstrating
pedicular screws at the L3 through L5 levels with intervening disc
spacers/cages. Hardware appears appropriately positioned. No
evidence of surgical complicating feature. Fluoroscopy was provided
for 2 minutes and 2 seconds.
IMPRESSION: Intraoperative fluoroscopic images demonstrating fixation hardware
at the L3 through L5 levels. Hardware appears appropriately
positioned. No evidence of surgical complicating feature.

## 2017-06-07 SURGERY — LAMINECTOMY WITH POSTERIOR LATERAL ARTHRODESIS LEVEL 2
Anesthesia: General | Site: Back

## 2017-06-07 MED ORDER — VANCOMYCIN HCL 1000 MG IV SOLR
INTRAVENOUS | Status: DC | PRN
Start: 2017-06-07 — End: 2017-06-07
  Administered 2017-06-07: 1000 mg via TOPICAL

## 2017-06-07 MED ORDER — DULOXETINE HCL 60 MG PO CPEP
60.0000 mg | ORAL_CAPSULE | Freq: Every day | ORAL | Status: DC
  Administered 2017-06-08 – 2017-06-12 (×5): 60 mg via ORAL
  Filled 2017-06-07 (×5): qty 1

## 2017-06-07 MED ORDER — SENNA 8.6 MG PO TABS
1.0000 | ORAL_TABLET | Freq: Two times a day (BID) | ORAL | Status: DC
  Administered 2017-06-07 – 2017-06-12 (×10): 8.6 mg via ORAL
  Filled 2017-06-07 (×10): qty 1

## 2017-06-07 MED ORDER — VALBENAZINE TOSYLATE 40 MG PO CAPS
40.0000 mg | ORAL_CAPSULE | Freq: Every day | ORAL | Status: DC
  Administered 2017-06-08 – 2017-06-12 (×5): 40 mg via ORAL
  Filled 2017-06-07 (×5): qty 1

## 2017-06-07 MED ORDER — PHENOL 1.4 % MT LIQD: 1.0000 | OROMUCOSAL | Status: DC | PRN

## 2017-06-07 MED ORDER — LACTATED RINGERS IV SOLN
INTRAVENOUS | Status: DC | PRN
  Administered 2017-06-07 (×3): via INTRAVENOUS

## 2017-06-07 MED ORDER — THROMBIN (RECOMBINANT) 5000 UNITS EX SOLR
CUTANEOUS | Status: DC | PRN
  Administered 2017-06-07: 08:00:00 via TOPICAL

## 2017-06-07 MED ORDER — PHENYLEPHRINE 40 MCG/ML (10ML) SYRINGE FOR IV PUSH (FOR BLOOD PRESSURE SUPPORT)
PREFILLED_SYRINGE | INTRAVENOUS | Status: AC
  Filled 2017-06-07: qty 10

## 2017-06-07 MED ORDER — PHENYLEPHRINE 40 MCG/ML (10ML) SYRINGE FOR IV PUSH (FOR BLOOD PRESSURE SUPPORT)
PREFILLED_SYRINGE | INTRAVENOUS | Status: DC | PRN
  Administered 2017-06-07 (×2): 80 ug via INTRAVENOUS

## 2017-06-07 MED ORDER — TIZANIDINE HCL 4 MG PO TABS
4.0000 mg | ORAL_TABLET | Freq: Two times a day (BID) | ORAL | Status: DC
  Administered 2017-06-07 – 2017-06-10 (×7): 4 mg via ORAL
  Filled 2017-06-07 (×7): qty 1

## 2017-06-07 MED ORDER — BUPIVACAINE HCL (PF) 0.25 % IJ SOLN
INTRAMUSCULAR | Status: DC | PRN
  Administered 2017-06-07: 10 mL

## 2017-06-07 MED ORDER — CHLORHEXIDINE GLUCONATE CLOTH 2 % EX PADS: 6.0000 | MEDICATED_PAD | Freq: Once | CUTANEOUS | Status: DC

## 2017-06-07 MED ORDER — VANCOMYCIN HCL IN DEXTROSE 1-5 GM/200ML-% IV SOLN
1000.0000 mg | Freq: Once | INTRAVENOUS | Status: AC
  Administered 2017-06-07: 1000 mg via INTRAVENOUS
  Filled 2017-06-07: qty 200

## 2017-06-07 MED ORDER — MORPHINE SULFATE (PF) 2 MG/ML IV SOLN
2.0000 mg | INTRAVENOUS | Status: DC | PRN
  Administered 2017-06-07 – 2017-06-11 (×11): 2 mg via INTRAVENOUS
  Filled 2017-06-07 (×11): qty 1

## 2017-06-07 MED ORDER — SUGAMMADEX SODIUM 200 MG/2ML IV SOLN
INTRAVENOUS | Status: DC | PRN
  Administered 2017-06-07: 300 mg via INTRAVENOUS

## 2017-06-07 MED ORDER — SODIUM CHLORIDE 0.9% FLUSH
3.0000 mL | INTRAVENOUS | Status: DC | PRN
  Administered 2017-06-10 – 2017-06-11 (×2): 3 mL via INTRAVENOUS
  Filled 2017-06-07 (×2): qty 3

## 2017-06-07 MED ORDER — BUPIVACAINE HCL (PF) 0.25 % IJ SOLN
INTRAMUSCULAR | Status: AC
  Filled 2017-06-07: qty 30

## 2017-06-07 MED ORDER — LIDOCAINE 2% (20 MG/ML) 5 ML SYRINGE
INTRAMUSCULAR | Status: DC | PRN
  Administered 2017-06-07: 100 mg via INTRAVENOUS

## 2017-06-07 MED ORDER — HYDROCODONE-ACETAMINOPHEN 7.5-325 MG PO TABS
1.0000 | ORAL_TABLET | ORAL | Status: DC | PRN
  Administered 2017-06-07 – 2017-06-11 (×14): 1 via ORAL
  Filled 2017-06-07 (×13): qty 1

## 2017-06-07 MED ORDER — ESMOLOL HCL 100 MG/10ML IV SOLN
INTRAVENOUS | Status: DC | PRN
  Administered 2017-06-07: 10 mg via INTRAVENOUS

## 2017-06-07 MED ORDER — SODIUM CHLORIDE 0.9% FLUSH
3.0000 mL | Freq: Two times a day (BID) | INTRAVENOUS | Status: DC
  Administered 2017-06-09 – 2017-06-12 (×6): 3 mL via INTRAVENOUS

## 2017-06-07 MED ORDER — MIDAZOLAM HCL 5 MG/5ML IJ SOLN
INTRAMUSCULAR | Status: DC | PRN
  Administered 2017-06-07: 2 mg via INTRAVENOUS

## 2017-06-07 MED ORDER — ACETAMINOPHEN 650 MG RE SUPP: 650.0000 mg | RECTAL | Status: DC | PRN

## 2017-06-07 MED ORDER — THROMBIN (RECOMBINANT) 5000 UNITS EX SOLR
CUTANEOUS | Status: AC
  Filled 2017-06-07: qty 5000

## 2017-06-07 MED ORDER — ONDANSETRON HCL 4 MG PO TABS: 4.0000 mg | ORAL_TABLET | Freq: Three times a day (TID) | ORAL | Status: DC | PRN

## 2017-06-07 MED ORDER — SUCCINYLCHOLINE CHLORIDE 200 MG/10ML IV SOSY
PREFILLED_SYRINGE | INTRAVENOUS | Status: AC
  Filled 2017-06-07: qty 10

## 2017-06-07 MED ORDER — MENTHOL 3 MG MT LOZG
1.0000 | LOZENGE | OROMUCOSAL | Status: DC | PRN
  Administered 2017-06-08: 3 mg via ORAL
  Filled 2017-06-07 (×2): qty 9

## 2017-06-07 MED ORDER — MEPERIDINE HCL 25 MG/ML IJ SOLN: 6.2500 mg | INTRAMUSCULAR | Status: DC | PRN

## 2017-06-07 MED ORDER — CELECOXIB 200 MG PO CAPS
200.0000 mg | ORAL_CAPSULE | Freq: Two times a day (BID) | ORAL | Status: DC
  Administered 2017-06-07 – 2017-06-10 (×7): 200 mg via ORAL
  Filled 2017-06-07 (×7): qty 1

## 2017-06-07 MED ORDER — SODIUM CHLORIDE 0.9 % IR SOLN
Status: DC | PRN
  Administered 2017-06-07: 08:00:00

## 2017-06-07 MED ORDER — ARTIFICIAL TEARS OPHTHALMIC OINT
TOPICAL_OINTMENT | OPHTHALMIC | Status: DC | PRN
  Administered 2017-06-07: 1 via OPHTHALMIC

## 2017-06-07 MED ORDER — HYDROMORPHONE HCL 1 MG/ML IJ SOLN
0.2500 mg | INTRAMUSCULAR | Status: DC | PRN
  Administered 2017-06-07: 0.5 mg via INTRAVENOUS

## 2017-06-07 MED ORDER — PREGABALIN 50 MG PO CAPS
100.0000 mg | ORAL_CAPSULE | Freq: Three times a day (TID) | ORAL | Status: DC
  Administered 2017-06-07 – 2017-06-12 (×15): 100 mg via ORAL
  Filled 2017-06-07 (×15): qty 2

## 2017-06-07 MED ORDER — HYDROCODONE-ACETAMINOPHEN 7.5-325 MG PO TABS
ORAL_TABLET | ORAL | Status: AC
  Filled 2017-06-07: qty 1

## 2017-06-07 MED ORDER — ONDANSETRON HCL 4 MG/2ML IJ SOLN: 4.0000 mg | Freq: Four times a day (QID) | INTRAMUSCULAR | Status: DC | PRN

## 2017-06-07 MED ORDER — 0.9 % SODIUM CHLORIDE (POUR BTL) OPTIME
TOPICAL | Status: DC | PRN
  Administered 2017-06-07: 1000 mL

## 2017-06-07 MED ORDER — ROCURONIUM BROMIDE 10 MG/ML (PF) SYRINGE
PREFILLED_SYRINGE | INTRAVENOUS | Status: DC | PRN
  Administered 2017-06-07: 50 mg via INTRAVENOUS
  Administered 2017-06-07: 10 mg via INTRAVENOUS

## 2017-06-07 MED ORDER — ACETAMINOPHEN 325 MG PO TABS: 650.0000 mg | ORAL_TABLET | ORAL | Status: DC | PRN

## 2017-06-07 MED ORDER — ANASTROZOLE 1 MG PO TABS
1.0000 mg | ORAL_TABLET | Freq: Every day | ORAL | Status: DC
  Administered 2017-06-08 – 2017-06-12 (×5): 1 mg via ORAL
  Filled 2017-06-07 (×5): qty 1

## 2017-06-07 MED ORDER — PROPOFOL 10 MG/ML IV BOLUS
INTRAVENOUS | Status: AC
  Filled 2017-06-07: qty 20

## 2017-06-07 MED ORDER — SODIUM CHLORIDE 0.9 % IV SOLN: 250.0000 mL | INTRAVENOUS | Status: DC

## 2017-06-07 MED ORDER — FENTANYL CITRATE (PF) 250 MCG/5ML IJ SOLN
INTRAMUSCULAR | Status: AC
  Filled 2017-06-07: qty 5

## 2017-06-07 MED ORDER — MIRABEGRON ER 25 MG PO TB24
50.0000 mg | ORAL_TABLET | Freq: Every day | ORAL | Status: DC
  Administered 2017-06-07 – 2017-06-12 (×6): 50 mg via ORAL
  Filled 2017-06-07 (×6): qty 2

## 2017-06-07 MED ORDER — METOCLOPRAMIDE HCL 5 MG/ML IJ SOLN: 10.0000 mg | Freq: Once | INTRAMUSCULAR | Status: DC | PRN

## 2017-06-07 MED ORDER — MONTELUKAST SODIUM 10 MG PO TABS
10.0000 mg | ORAL_TABLET | Freq: Every day | ORAL | Status: DC
  Administered 2017-06-07 – 2017-06-11 (×5): 10 mg via ORAL
  Filled 2017-06-07 (×5): qty 1

## 2017-06-07 MED ORDER — PANTOPRAZOLE SODIUM 40 MG PO TBEC
40.0000 mg | DELAYED_RELEASE_TABLET | Freq: Every day | ORAL | Status: DC
  Administered 2017-06-08 – 2017-06-12 (×5): 40 mg via ORAL
  Filled 2017-06-07 (×5): qty 1

## 2017-06-07 MED ORDER — ONDANSETRON HCL 4 MG PO TABS: 4.0000 mg | ORAL_TABLET | Freq: Four times a day (QID) | ORAL | Status: DC | PRN

## 2017-06-07 MED ORDER — LIDOCAINE 2% (20 MG/ML) 5 ML SYRINGE
INTRAMUSCULAR | Status: AC
  Filled 2017-06-07: qty 5

## 2017-06-07 MED ORDER — VANCOMYCIN HCL 1000 MG IV SOLR
INTRAVENOUS | Status: AC
  Filled 2017-06-07: qty 1000

## 2017-06-07 MED ORDER — HYDROMORPHONE HCL 1 MG/ML IJ SOLN
INTRAMUSCULAR | Status: AC
  Filled 2017-06-07: qty 1

## 2017-06-07 MED ORDER — MIDAZOLAM HCL 2 MG/2ML IJ SOLN
INTRAMUSCULAR | Status: AC
  Filled 2017-06-07: qty 2

## 2017-06-07 MED ORDER — BUMETANIDE 2 MG PO TABS
2.0000 mg | ORAL_TABLET | Freq: Every day | ORAL | Status: DC
  Administered 2017-06-07 – 2017-06-12 (×6): 2 mg via ORAL
  Filled 2017-06-07 (×6): qty 1

## 2017-06-07 MED ORDER — PROPOFOL 10 MG/ML IV BOLUS
INTRAVENOUS | Status: DC | PRN
  Administered 2017-06-07: 200 mg via INTRAVENOUS

## 2017-06-07 MED ORDER — ROCURONIUM BROMIDE 10 MG/ML (PF) SYRINGE
PREFILLED_SYRINGE | INTRAVENOUS | Status: AC
  Filled 2017-06-07: qty 5

## 2017-06-07 MED ORDER — ONDANSETRON HCL 4 MG/2ML IJ SOLN
INTRAMUSCULAR | Status: DC | PRN
  Administered 2017-06-07: 4 mg via INTRAVENOUS

## 2017-06-07 MED ORDER — POTASSIUM CHLORIDE IN NACL 20-0.9 MEQ/L-% IV SOLN
INTRAVENOUS | Status: DC
  Administered 2017-06-07: 75 mL via INTRAVENOUS
  Filled 2017-06-07 (×3): qty 1000

## 2017-06-07 MED ORDER — FENTANYL CITRATE (PF) 250 MCG/5ML IJ SOLN
INTRAMUSCULAR | Status: DC | PRN
  Administered 2017-06-07: 150 ug via INTRAVENOUS

## 2017-06-07 MED FILL — Sodium Chloride IV Soln 0.9%: INTRAVENOUS | Qty: 1000 | Status: AC

## 2017-06-07 MED FILL — Heparin Sodium (Porcine) Inj 1000 Unit/ML: INTRAMUSCULAR | Qty: 30 | Status: AC

## 2017-06-07 SURGICAL SUPPLY — 58 items
APL SKNCLS STERI-STRIP NONHPOA (GAUZE/BANDAGES/DRESSINGS) ×1
BAG DECANTER FOR FLEXI CONT (MISCELLANEOUS) ×2 IMPLANT
BASKET BONE COLLECTION (BASKET) IMPLANT
BENZOIN TINCTURE PRP APPL 2/3 (GAUZE/BANDAGES/DRESSINGS) ×2 IMPLANT
BLADE CLIPPER SURG (BLADE) IMPLANT
BUR MATCHSTICK NEURO 3.0 LAGG (BURR) ×2 IMPLANT
CAGE SPINAL PC 11X10X30 5D (Cage) ×2 IMPLANT
CANISTER SUCT 3000ML PPV (MISCELLANEOUS) ×2 IMPLANT
CARTRIDGE OIL MAESTRO DRILL (MISCELLANEOUS) ×1 IMPLANT
CONT SPEC 4OZ CLIKSEAL STRL BL (MISCELLANEOUS) ×2 IMPLANT
COVER BACK TABLE 60X90IN (DRAPES) ×2 IMPLANT
DIFFUSER DRILL AIR PNEUMATIC (MISCELLANEOUS) ×2 IMPLANT
DRAPE C-ARM 42X72 X-RAY (DRAPES) IMPLANT
DRAPE LAPAROTOMY 100X72X124 (DRAPES) ×2 IMPLANT
DRAPE POUCH INSTRU U-SHP 10X18 (DRAPES) ×2 IMPLANT
DRAPE SURG 17X23 STRL (DRAPES) ×2 IMPLANT
DRSG OPSITE POSTOP 4X6 (GAUZE/BANDAGES/DRESSINGS) ×2 IMPLANT
DURAPREP 26ML APPLICATOR (WOUND CARE) ×2 IMPLANT
ELECT REM PT RETURN 9FT ADLT (ELECTROSURGICAL) ×2
ELECTRODE REM PT RTRN 9FT ADLT (ELECTROSURGICAL) ×1 IMPLANT
EVACUATOR 1/8 PVC DRAIN (DRAIN) IMPLANT
GAUZE SPONGE 4X4 16PLY XRAY LF (GAUZE/BANDAGES/DRESSINGS) IMPLANT
GLOVE BIO SURGEON STRL SZ7 (GLOVE) IMPLANT
GLOVE BIO SURGEON STRL SZ8 (GLOVE) ×4 IMPLANT
GLOVE BIOGEL PI IND STRL 7.0 (GLOVE) IMPLANT
GLOVE BIOGEL PI INDICATOR 7.0 (GLOVE)
GLOVE ECLIPSE 6.5 STRL STRAW (GLOVE) ×2 IMPLANT
GLOVE SURG SS PI 7.5 STRL IVOR (GLOVE) ×2 IMPLANT
GOWN STRL REUS W/ TWL LRG LVL3 (GOWN DISPOSABLE) ×2 IMPLANT
GOWN STRL REUS W/ TWL XL LVL3 (GOWN DISPOSABLE) ×1 IMPLANT
GOWN STRL REUS W/TWL 2XL LVL3 (GOWN DISPOSABLE) IMPLANT
GOWN STRL REUS W/TWL LRG LVL3 (GOWN DISPOSABLE) ×2
GOWN STRL REUS W/TWL XL LVL3 (GOWN DISPOSABLE) ×2
HEMOSTAT POWDER KIT SURGIFOAM (HEMOSTASIS) ×2 IMPLANT
KIT BASIN OR (CUSTOM PROCEDURE TRAY) ×2 IMPLANT
KIT INFUSE XX SMALL 0.7CC (Orthopedic Implant) ×2 IMPLANT
KIT ROOM TURNOVER OR (KITS) ×2 IMPLANT
NEEDLE HYPO 25X1 1.5 SAFETY (NEEDLE) ×2 IMPLANT
NS IRRIG 1000ML POUR BTL (IV SOLUTION) ×2 IMPLANT
OIL CARTRIDGE MAESTRO DRILL (MISCELLANEOUS) ×2
PACK LAMINECTOMY NEURO (CUSTOM PROCEDURE TRAY) ×2 IMPLANT
PAD ARMBOARD 7.5X6 YLW CONV (MISCELLANEOUS) ×6 IMPLANT
PUTTY DBM ALLOSYNC PURE 10CC (Putty) ×2 IMPLANT
ROD PC 5.5X70 TI ARSENAL (Rod) ×4 IMPLANT
SCREW CBX 5.5X35MM (Screw) ×12 IMPLANT
SCREW SET SPINAL ARSENAL 47127 (Screw) ×12 IMPLANT
SPACER BATT PS 9X25X11 (Spacer) ×4 IMPLANT
SPONGE LAP 4X18 X RAY DECT (DISPOSABLE) IMPLANT
SPONGE SURGIFOAM ABS GEL 100 (HEMOSTASIS) ×2 IMPLANT
STRIP CLOSURE SKIN 1/2X4 (GAUZE/BANDAGES/DRESSINGS) ×2 IMPLANT
SUT VIC AB 0 CT1 18XCR BRD8 (SUTURE) ×1 IMPLANT
SUT VIC AB 0 CT1 8-18 (SUTURE) ×2
SUT VIC AB 2-0 CP2 18 (SUTURE) ×2 IMPLANT
SUT VIC AB 3-0 SH 8-18 (SUTURE) ×4 IMPLANT
TOWEL GREEN STERILE (TOWEL DISPOSABLE) ×2 IMPLANT
TOWEL GREEN STERILE FF (TOWEL DISPOSABLE) ×2 IMPLANT
TRAY FOLEY W/METER SILVER 16FR (SET/KITS/TRAYS/PACK) ×2 IMPLANT
WATER STERILE IRR 1000ML POUR (IV SOLUTION) ×2 IMPLANT

## 2017-06-07 NOTE — Op Note (Signed)
06/07/2017  12:22 PM  PATIENT:  Kelsey Wilson  57 y.o. 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 Decompressive lumbar laminectomy L3-4 and L4-5 requiring more work than would be required for a simple exposure of the disk for PLIF in order to adequately decompress the neural elements and address the spinal stenosis including redo decompression L4-5 on the left 2. Posterior lumbar interbody fusion L3-4 using peek interbody cages packed with morcellized allograft and autograft, left transforaminal interbody fusion L4-5 utilizing a porous titanium cage packed with morcellized allograft and autograft 3. Posterior fixation L3-L5 inclusive using Alphatec cortical pedicle screws.  4. Intertransverse arthrodesis L3-5 using morcellized autograft and allograft.  SURGEON:  Sherley Bounds, MD  ASSISTANTS: Dr. Christella Noa  ANESTHESIA:  General  EBL: 200 ml  Total I/O In: 2000 [I.V.:2000] Out: 425 [Urine:225; Blood:200]  BLOOD ADMINISTERED:none  DRAINS: none   INDICATION FOR PROCEDURE: This patient presented with severe postoperative back and bilateral leg pain. Imaging revealed postlaminectomy spondylolisthesis L4-5 with pseudoarthrosis with spondylolisthesis L3-4 and spinal stenosis. The patient tried a reasonable attempt at conservative medical measures without relief. I recommended decompression and instrumented fusion to address the stenosis as well as the segmental  instability.  Patient understood the risks, benefits, and alternatives and potential outcomes and wished to proceed.  PROCEDURE DETAILS:  The patient was brought to the operating room. After induction of generalized endotracheal anesthesia the patient was rolled into the prone position on chest rolls and all pressure points were padded. The patient's lumbar region was cleaned  and then prepped with DuraPrep and draped in the usual sterile fashion. Anesthesia was injected and then a dorsal midline incision was made and carried down to the lumbosacral fascia. The fascia was opened and the paraspinous musculature was taken down in a subperiosteal fashion to expose L3-4 and L4-5. A self-retaining retractor was placed. Intraoperative fluoroscopy confirmed my level, and I started with placement of the L3 cortical pedicle screws. The pedicle screw entry zones were identified utilizing surface landmarks and  AP and lateral fluoroscopy. I scored the cortex with the high-speed drill and then used the hand drill to drill an upward and outward direction into the pedicle. I then tapped line to line. I then placed a 5.5 x 35 mm cortical pedicle screw into the pedicles of L3 bilaterally. I then turned my attention to the decompression and complete lumbar laminectomies, hemi- facetectomies, and foraminotomies were performed at L3-4 and L4-5 on the left. The patient had significant spinal stenosis and this required more work than would be required for a simple exposure of the disc for posterior lumbar interbody fusion which would only require a limited laminotomy. Much more generous decompression and generous foraminotomy was undertaken in order to adequately decompress the neural elements and address the patient's leg pain. The yellow ligament was removed to expose the underlying dura and nerve roots, and generous foraminotomies were performed to adequately decompress the neural elements. Both the exiting and traversing nerve roots were decompressed on both sides until a coronary dilator passed easily along the nerve roots. Once the decompression was complete, I turned my attention to the posterior lower lumbar interbody fusion. The epidural venous vasculature was coagulated and cut sharply. Disc space was incised and the initial discectomy was performed with pituitary rongeurs. The disc space was  distracted with sequential distractors to a height of 11 mm at  both levels. We then used a series of scrapers and shavers to prepare the endplates for fusion. The midline was prepared with Epstein curettes. Once the complete discectomy was finished, we packed an appropriate sized interbody cage with local autograft and morcellized allograft, gently retracted the nerve root, and tapped the cage into position at L3-4.  The midline between the cages was packed with morselized autograft and allograft. At L4-5 we performed a transforaminal lumbar interbody fusion from the left. The 11 mm porous titanium cage was driven into the disc space from the left-hand side utilizing a lateral fluoroscopy after the disc space was packed with local autograft and morcellized allograft and an extra extra small BMP-soaked sponge. We then turned our attention to the placement of the lower pedicle screws. The pedicle screw entry zones were identified utilizing surface landmarks and AP/lateral fluoroscopy. I drilled into each pedicle utilizing the hand drill, and tapped each pedicle with the appropriate tap. We palpated with a ball probe to assure no break in the cortex. We then placed 5.5 x 35 mm cortical pedicle screws into the pedicles bilaterally at L4-L5. We then decorticated the transverse processes and laid a mixture of morcellized autograft and allograft out over these to perform intertransverse arthrodesis at L3-L5. We then placed lordotic rods into the multiaxial screw heads of the pedicle screws and locked these in position with the locking caps and anti-torque device. We then checked our construct with AP and lateral fluoroscopy. Irrigated with copious amounts of bacitracin-containing saline solution. Inspected the nerve roots once again to assure adequate decompression, lined to the dura with Gelfoam, placed powdered vancomycin into the wound, and closed the muscle and the fascia with 0 Vicryl. Closed the subcutaneous tissues  with 2-0 Vicryl and subcuticular tissues with 3-0 Vicryl. The skin was closed with benzoin and Steri-Strips. Dressing was then applied, the patient was awakened from general anesthesia and transported to the recovery room in stable condition. At the end of the procedure all sponge, needle and instrument counts were correct.   PLAN OF CARE: admit to inpatient  PATIENT DISPOSITION:  PACU - hemodynamically stable.   Delay start of Pharmacological VTE agent (>24hrs) due to surgical blood loss or risk of bleeding:  yes

## 2017-06-07 NOTE — Transfer of Care (Signed)
Immediate Anesthesia Transfer of Care Note  Patient: Kelsey Wilson  Procedure(s) Performed: Posterior Lateral Fusion Lumbar Three-Four and Transforaminal Interbody Fusion Lumbar Four-Five with Segmental  Pedicle Screw Fixation (N/A Back)  Patient Location: PACU  Anesthesia Type:General  Level of Consciousness: sedated, patient cooperative and responds to stimulation  Airway & Oxygen Therapy: Patient Spontanous Breathing and Patient connected to nasal cannula oxygen  Post-op Assessment: Report given to RN, Post -op Vital signs reviewed and stable and Patient moving all extremities  Post vital signs: Reviewed and stable  Last Vitals:  Vitals:   06/07/17 0600 06/07/17 1215  BP: 120/77 (P) 138/79  Pulse: 97   Resp: 20 (P) 20  Temp: 36.6 C (P) 36.8 C  SpO2: 98%     Last Pain:  Vitals:   06/07/17 0600  TempSrc: Oral         Complications: No apparent anesthesia complications

## 2017-06-07 NOTE — Anesthesia Procedure Notes (Signed)
Procedure Name: Intubation Date/Time: 06/07/2017 7:47 AM Performed by: Myna Bright, CRNA Pre-anesthesia Checklist: Patient identified, Emergency Drugs available, Suction available and Patient being monitored Patient Re-evaluated:Patient Re-evaluated prior to induction Oxygen Delivery Method: Circle system utilized Preoxygenation: Pre-oxygenation with 100% oxygen Induction Type: IV induction Ventilation: Mask ventilation without difficulty Laryngoscope Size: Mac and 3 Grade View: Grade III Tube type: Oral Tube size: 7.0 mm Number of attempts: 1 Airway Equipment and Method: Stylet Placement Confirmation: ETT inserted through vocal cords under direct vision,  positive ETCO2 and breath sounds checked- equal and bilateral Secured at: 21 cm Tube secured with: Tape Dental Injury: Teeth and Oropharynx as per pre-operative assessment

## 2017-06-07 NOTE — H&P (Signed)
Subjective: Patient is a 57 y.o. female admitted for back and leg pain. Onset of symptoms was several months ago, gradually worsening since that time.  The pain is rated severe, and is located at the across the lower back and radiates to legs. The pain is described as aching and occurs all day. The symptoms have been progressive. Symptoms are exacerbated by exercise. MRI or CT showed spondylolisthesis L3-4 l4-5   Past Medical History:  Diagnosis Date  . Anemia   . Anxiety   . Arthritis   . Breast cancer ([place]) 2010   T3N1 invasive ductal carcinoma left breast.Takes Arimidex daily  . Bursitis   . Carpal tunnel syndrome   . Chronic back pain    stenosis  . Constipation    takes Colace daily  . Depression    takes Benzotropine daily  . Diverticulitis of colon   . Dyspnea    daily when walking for over 1 yr.  . Fibromyalgia 08/2012  . GERD (gastroesophageal reflux disease)    takes Dexilant daily  . Hemorrhoid   . History of blood transfusion    no abnormal reaction noted  . History of colon polyps    benign  . History of shingles   . Joint pain   . Joint swelling   . Night muscle spasms    takes Flexeril nightly as needed  . Nocturia   . OSA (obstructive sleep apnea)   . OSA on CPAP   . Peripheral edema    takes Furosemide.Just started 01/18/16  . Peripheral neuropathy    takes Lyrica daily  . Personal history of chemotherapy    2013  . Personal history of radiation therapy    2013  . Pneumonia    hx of-2015  . Seasonal allergies    takes Singulair nightly  . SOB (shortness of breath) on exertion    rarely with exertion  . 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 Alphonzo Grieve)    Past Surgical History:  Procedure Laterality Date  . ABDOMINAL HYSTERECTOMY     still has ovaries  . APPENDECTOMY    . AXILLARY LYMPH NODE DISSECTION  11/28/2011   Procedure:  AXILLARY LYMPH NODE DISSECTION;  Surgeon: Edward Jolly, MD;  Location: [facility];  Service: General;  Laterality: Left;  . BREAST LUMPECTOMY Left 11/28/2011   Malignant  . BREAST SURGERY Left 2013  . CARPAL TUNNEL RELEASE     Bilateral  . CESAREAN SECTION     pt. has had 3  . CHOLECYSTECTOMY    . COLONOSCOPY    . EYE SURGERY Bilateral    cataract removal  . KNEE SURGERY     Left Knee  . LUMBAR LAMINECTOMY/DECOMPRESSION MICRODISCECTOMY Bilateral 01/27/2016   Procedure: Laminectomy and Foraminotomy - Lumbar four -Lumbar five - bilateral- on-lay noninstrumented fusion;  Surgeon: Eustace Moore, MD;  Location: [facility];  Service: Neurosurgery;  Laterality: Bilateral;  . MULTIPLE EXTRACTIONS WITH ALVEOLOPLASTY N/A 05/11/2016   Procedure: EXTRACTION OF TEETH EIGHTEEN, TWENTY AND TWENTY- NINE;  REMOVAL OF MANDIBULAR TORUS AND EXOSTOSIS;  Surgeon: Diona Browner, DDS;  Location: [facility];  Service: Oral Surgery;  Laterality: N/A;  . PORTACATH PLACEMENT  06/18/2011   Procedure: INSERTION PORT-A-CATH;  Surgeon: Edward Jolly, MD;  Location: [facility];  Service: General;  Laterality: Right;  right subclavian  . removal portacath  2014  .  spur     Apex spur on both big toes  . TOE SURGERY Bilateral   . TONSILLECTOMY    . TOTAL KNEE ARTHROPLASTY Left 09/13/2014  . TOTAL KNEE ARTHROPLASTY Left 09/13/2014   Procedure: LEFT TOTAL KNEE ARTHROPLASTY;  Surgeon: Rod Can, MD;  Location: [facility];  Service: Orthopedics;  Laterality: Left;  . TOTAL KNEE ARTHROPLASTY Right 03/10/2015   Procedure: RIGHT TOTAL KNEE ARTHROPLASTY;  Surgeon: Rod Can, MD;  Location: [facility];  Service: Orthopedics;  Laterality: Right;    Prior to Admission medications   Medication Sig Start Date End Date Taking? Authorizing Provider  anastrozole (ARIMIDEX) 1 MG tablet Take 1 tablet (1 mg total) by mouth daily. 03/07/17  Yes Causey, Charlestine Massed, NP  bumetanide (BUMEX) 2 MG tablet Take 2 mg by mouth  daily.    Yes [provider]  Dexlansoprazole (DEXILANT) 30 MG capsule Take 30 mg by mouth daily.   Yes [provider]  DULoxetine (CYMBALTA) 60 MG capsule Take 60 mg by mouth once daily 06/28/16  Yes [provider]  ergocalciferol (VITAMIN D2) 50000 units capsule Take 1 capsule (50,000 Units total) by mouth once a week. Patient taking differently: Take 50,000 Units by mouth every Monday.  03/27/17  Yes Causey, Charlestine Massed, NP  mirabegron ER (MYRBETRIQ) 50 MG TB24 tablet Take 50 mg by mouth daily.   Yes [provider]  montelukast (SINGULAIR) 10 MG tablet Take 10 mg by mouth at bedtime.   Yes [provider]  ondansetron (ZOFRAN) 4 MG tablet Take 4 mg by mouth every 8 (eight) hours as needed for nausea or vomiting.   Yes [provider]  potassium chloride SA (K-DUR,KLOR-CON) 20 MEQ tablet Take 20 mEq by mouth 2 (two) times daily.    Yes [provider]  pregabalin (LYRICA) 100 MG capsule Take 1 capsule (100 mg total) by mouth 3 (three) times daily. 03/07/17  Yes Kirsteins, Luanna Salk, MD  sulfamethoxazole-trimethoprim (BACTRIM DS,SEPTRA DS) 800-160 MG tablet Take 1 tablet by mouth 2 (two) times daily. 05/20/17  Yes [provider]  tiZANidine (ZANAFLEX) 4 MG tablet Take 4 mg by mouth 2 (two) times daily.    Yes [provider]  traMADol (ULTRAM) 50 MG tablet TAKE 2 TABLETS BY MOUTH THREE TIMES DAILY AS NEEDED FOR MODERATE PAIN Patient taking differently: TAKE 50 MG BY MOUTH THREE TIMES DAILY AS NEEDED FOR MODERATE PAIN 01/09/17  Yes Kirsteins, Luanna Salk, MD  Valbenazine Tosylate [facility]) 40 MG CAPS Take 40 mg by mouth daily.   Yes [provider]  benzonatate (TESSALON) 100 MG capsule Take 100 mg by mouth 4 (four) times daily as needed for cough.     [provider]  clindamycin (CLEOCIN) 300 MG capsule Take 600 mg by mouth See admin instructions. Take 600 mg by mouth 1 hour prior to dental  procedures 02/15/16   [provider]  diclofenac sodium (VOLTAREN) 1 % GEL Apply 2 g topically 4 (four) times daily. Patient taking differently: Apply 2 g topically 4 (four) times daily as needed (for pain).  09/04/16   Kirsteins, Luanna Salk, MD  docusate sodium (COLACE) 100 MG capsule Take 1 capsule (100 mg total) by mouth every 12 (twelve) hours. Patient taking differently: Take 100 mg by mouth daily.  10/16/15   Recardo Evangelist, PA-C   Allergies  Allergen Reactions  . Aleve [Naproxen] Nausea Only  . Compazine [Prochlorperazine] Other (See Comments)    Numbness of face and  lips  . Oxycodone Other (See Comments)    hallucinations  . Penicillins Nausea Only and Other (See Comments)    Has patient had a PCN reaction causing immediate rash, facial/tongue/throat swelling, SOB or lightheadedness with hypotension: No Has patient had a PCN reaction causing severe rash involving mucus membranes or skin necrosis: No Has patient had a PCN reaction that required hospitalization No Has patient had a PCN reaction occurring within the last 10 years: No If all of the above answers are "NO", then may proceed with Cephalosporin use.    Social History   Tobacco Use  . Smoking status: Never Smoker  . Smokeless tobacco: Never Used  Substance Use Topics  . Alcohol use: No    Alcohol/week: 0.0 oz    Family History  Problem Relation Age of Onset  . Hypertension Mother   . Diabetes Mother   . Hypertension Father   . Diabetes Father   . Cancer Paternal Grandmother        unknown  . Colon cancer Neg Hx      Review of Systems  Positive ROS: neg  All other systems have been reviewed and were otherwise negative with the exception of those mentioned in the HPI and as above.  Objective: Vital signs in last 24 hours: Temp:  [97.8 F (36.6 C)] 97.8 F (36.6 C) (01/04 0600) Pulse Rate:  [97] 97 (01/04 0600) Resp:  [20] 20 (01/04 0600) BP: (120)/(77) 120/77 (01/04 0600) SpO2:  [98 %] 98 %  (01/04 0600)  General Appearance: Alert, cooperative, no distress, appears stated age Head: Normocephalic, without obvious abnormality, atraumatic Eyes: PERRL, conjunctiva/corneas clear, EOM's intact    Neck: Supple, symmetrical, trachea midline Back: Symmetric, no curvature, ROM normal, no CVA tenderness Lungs:  respirations unlabored Heart: Regular rate and rhythm Abdomen: Soft, non-tender Extremities: Extremities normal, atraumatic, no cyanosis or edema Pulses: 2+ and symmetric all extremities Skin: Skin color, texture, turgor normal, no rashes or lesions  NEUROLOGIC:   Mental status: Alert and oriented x4,  no aphasia, good attention span, fund of knowledge, and memory Motor Exam - grossly normal Sensory Exam - grossly normal Reflexes: trace Coordination - grossly normal Gait - grossly normal Balance - grossly normal Cranial Nerves: I: smell Not tested  II: visual acuity  OS: nl    OD: nl  II: visual fields Full to confrontation  II: pupils Equal, round, reactive to light  III,VII: ptosis None  III,IV,VI: extraocular muscles  Full ROM  V: mastication Normal  V: facial light touch sensation  Normal  V,VII: corneal reflex  Present  VII: facial muscle function - upper  Normal  VII: facial muscle function - lower Normal  VIII: hearing Not tested  IX: soft palate elevation  Normal  IX,X: gag reflex Present  XI: trapezius strength  5/5  XI: sternocleidomastoid strength 5/5  XI: neck flexion strength  5/5  XII: tongue strength  Normal    Data Review Lab Results  Component Value Date   WBC 3.8 (L) 05/29/2017   HGB 11.7 (L) 05/29/2017   HCT 35.1 (L) 05/29/2017   MCV 86.5 05/29/2017   PLT 201 05/29/2017   Lab Results  Component Value Date   NA 137 05/29/2017   K 3.5 05/29/2017   CL 98 (L) 05/29/2017   CO2 29 05/29/2017   BUN 13 05/29/2017   CREATININE 0.90 05/29/2017   GLUCOSE 124 (H) 05/29/2017   Lab Results  Component Value Date   INR 0.89 05/29/2017  Assessment/Plan: Patient admitted for lumbar fusion L3-5. Patient has failed a reasonable attempt at conservative therapy.  I explained the condition and procedure to the patient and answered any questions.  Patient wishes to proceed with procedure as planned. Understands risks/ benefits and typical outcomes of procedure.   Alima Naser S 06/07/2017 7:33 AM

## 2017-06-07 NOTE — Progress Notes (Signed)
Vancomycin per Pharmacy for surgery prophylaxis  58 YO F s/p spinal surgery, on vancomycin for surgical prophylaxis, pharmacy to adjust dosage base one renal function. Scr 0.9 on 05/29/2017, est. crcl ~ 85 ml/min. Received pre-op dose 1500 vancomycin at ~ 0730. No open drain post op per chart  Plan: Vancomycin 1000mg  IV at 2000. Pharmacy sign off.  Thanks.   Maryanna Shape, PharmD, BCPS  Clinical Pharmacist  Pager: [phone]

## 2017-06-07 NOTE — Anesthesia Postprocedure Evaluation (Signed)
Anesthesia Post Note  Patient: Kelsey Wilson  Procedure(s) Performed: Posterior Lateral Fusion Lumbar Three-Four and Transforaminal Interbody Fusion Lumbar Four-Five with Segmental  Pedicle Screw Fixation (N/A Back)     Patient location during evaluation: PACU Anesthesia Type: General Level of consciousness: awake and alert Pain management: pain level controlled Vital Signs Assessment: post-procedure vital signs reviewed and stable Respiratory status: spontaneous breathing, nonlabored ventilation and respiratory function stable Cardiovascular status: blood pressure returned to baseline and stable Postop Assessment: no apparent nausea or vomiting Anesthetic complications: no    Last Vitals:  Vitals:   06/07/17 1316 06/07/17 1331  BP: 115/70 132/74  Pulse: 92 93  Resp: 19 19  Temp:    SpO2: 100% 100%    Last Pain:  Vitals:   06/07/17 1331  TempSrc:   PainSc: Asleep                 Lynda Rainwater

## 2017-06-07 NOTE — Anesthesia Preprocedure Evaluation (Signed)
Anesthesia Evaluation  Patient identified by MRN, date of birth, ID band Patient awake    Reviewed: Allergy & Precautions, NPO status , Patient's Chart, lab work & pertinent test results  Airway Mallampati: II  TM Distance: >3 FB Neck ROM: Full    Dental  (+) Dental Advisory Given, Poor Dentition   Pulmonary neg pulmonary ROS,    Pulmonary exam normal breath sounds clear to auscultation       Cardiovascular + Peripheral Vascular Disease  Normal cardiovascular exam Rhythm:Regular Rate:Normal  03/29/16 Stress Test: Nuclear stress EF: 64%. There was no ST segment deviation noted during stress. Defect 1: There is a small defect of moderate severity present in the apex location. The study is normal. The left ventricular ejection fraction is normal (55-65%).   Normal stress nuclear study with mild apical thinning; no ischemia or infarction; EF 64 with normal wall motion.   Neuro/Psych  Neuromuscular disease    GI/Hepatic Neg liver ROS, GERD  Medicated,  Endo/Other  Morbid obesity  Renal/GU negative Renal ROS     Musculoskeletal  (+) Arthritis , Fibromyalgia -  Abdominal   Peds  Hematology  (+) Blood dyscrasia, anemia ,   Anesthesia Other Findings Day of surgery medications reviewed with the patient.  Reproductive/Obstetrics                             Anesthesia Physical  Anesthesia Plan  ASA: III  Anesthesia Plan: General   Post-op Pain Management:    Induction: Intravenous  PONV Risk Score and Plan: 3 and Ondansetron, Midazolam and Dexamethasone  Airway Management Planned: Oral ETT  Additional Equipment:   Intra-op Plan:   Post-operative Plan: Extubation in OR  Informed Consent: I have reviewed the patients History and Physical, chart, labs and discussed the procedure including the risks, benefits and alternatives for the proposed anesthesia with the patient or  authorized representative who has indicated his/her understanding and acceptance.   Dental advisory given  Plan Discussed with: CRNA  Anesthesia Plan Comments: (Risks/benefits of general anesthesia discussed with patient including risk of damage to teeth, lips, gum, and tongue, nausea/vomiting, allergic reactions to medications, and the possibility of heart attack, stroke and death.  All patient questions answered.  Patient wishes to proceed.)        Anesthesia Quick Evaluation

## 2017-06-08 LAB — CBC
HCT: 21.6 % — ABNORMAL LOW (ref 36.0–46.0)
Hemoglobin: 7.1 g/dL — ABNORMAL LOW (ref 12.0–15.0)
MCH: 28.5 pg (ref 26.0–34.0)
MCHC: 32.9 g/dL (ref 30.0–36.0)
MCV: 86.7 fL (ref 78.0–100.0)
Platelets: 132 10*3/uL — ABNORMAL LOW (ref 150–400)
RBC: 2.49 MIL/uL — ABNORMAL LOW (ref 3.87–5.11)
RDW: 13.9 % (ref 11.5–15.5)
WBC: 6.8 10*3/uL (ref 4.0–10.5)

## 2017-06-08 LAB — GLUCOSE, CAPILLARY: Glucose-Capillary: 113 mg/dL — ABNORMAL HIGH (ref 65–99)

## 2017-06-08 MED ORDER — SODIUM CHLORIDE 0.9 % IV BOLUS (SEPSIS)
500.0000 mL | Freq: Once | INTRAVENOUS | Status: AC
  Administered 2017-06-08: 500 mL via INTRAVENOUS

## 2017-06-08 NOTE — Progress Notes (Signed)
Patient bp 87/48 and dressing saturated with bloody drainage.Marland KitchenMarland KitchenCalled Dr. Christella Noa for orders.

## 2017-06-08 NOTE — Progress Notes (Signed)
Patient ID: Kelsey Wilson, female   DOB: Apr 19, 1961, 57 y.o.   MRN: [id number] BP 102/67 (BP Location: Right Arm)   Pulse 86   Temp 99.1 F (37.3 C) (Oral)   Resp 18   SpO2 99%  Alert and oriented x 4 Speech is clear and fluent Moving all extremities well hgb 7.1 Wound has been draining, dressing changed x 4. Will monitor, for now no need for transfusion, mentating normally

## 2017-06-08 NOTE — Evaluation (Signed)
Physical Therapy Evaluation Patient Details Name: Kelsey Wilson MRN: [id number] DOB: [birth year] Today's Date: 06/08/2017   History of Present Illness  Pt is a 58 y.o. female s/p PLIF.  Clinical Impression  Patient is s/p above surgery resulting in the deficits listed below (see PT Problem List). On eval, pt required mod assist bed mobility. Unable to progress beyond EOB due to pain and weakness. Pt reporting 9/10 pain. RN administered pain meds during session. Pt also with 7.1 Hbg. Patient will benefit from skilled PT to increase their independence and safety with mobility (while adhering to their precautions) to allow discharge to the venue listed below.     Follow Up Recommendations Home health PT;Supervision/Assistance - 24 hour    Equipment Recommendations  Rolling walker with 5" wheels    Recommendations for Other Services       Precautions / Restrictions Precautions Precautions: Back Precaution Comments: Educated pt on 3/3 back precautions. Required Braces or Orthoses: Spinal Brace Spinal Brace: Applied in sitting position;Lumbar corset      Mobility  Bed Mobility Overal bed mobility: Needs Assistance Bed Mobility: Sidelying to Sit;Rolling;Sit to Sidelying Rolling: Min assist Sidelying to sit: Mod assist;HOB elevated     Sit to sidelying: Mod assist General bed mobility comments: +rail, verbal cues for logroll, assist with BLE and to elevate trunk  Transfers                 General transfer comment: unable to progress beyond EOB due to pain and fatigue/weakness  Ambulation/Gait             General Gait Details: unable  Stairs            Wheelchair Mobility    Modified Rankin (Stroke Patients Only)       Balance Overall balance assessment: Needs assistance Sitting-balance support: No upper extremity supported;Feet supported Sitting balance-Leahy Scale: Fair                                       Pertinent  Vitals/Pain Pain Assessment: 0-10 Pain Score: 9  Pain Location: sx site Pain Descriptors / Indicators: Guarding;Grimacing;Sore Pain Intervention(s): Limited activity within patient's tolerance;Monitored during session;RN gave pain meds during session    [place] expects to be discharged to:: Private residence Living Arrangements: Children Available Help at Discharge: Family;Available 24 hours/day Type of Home: Apartment Home Access: Level entry     Home Layout: One level Home Equipment: Cane - single point      Prior Function Level of Independence: Independent;Independent with assistive device(s)         Comments: cane for ambulation     Hand Dominance   Dominant Hand: Right    Extremity/Trunk Assessment                Communication   Communication: No difficulties  Cognition Arousal/Alertness: Awake/alert Behavior During Therapy: WFL for tasks assessed/performed Overall Cognitive Status: Within Functional Limits for tasks assessed                                        General Comments      Exercises     Assessment/Plan    PT Assessment Patient needs continued PT services  PT Problem List Decreased strength;Decreased mobility;Obesity;Decreased knowledge of precautions;Decreased activity tolerance;Decreased balance;Decreased knowledge of  use of DME;Pain       PT Treatment Interventions DME instruction;Therapeutic activities;Gait training;Therapeutic exercise;Patient/family education;Balance training;Functional mobility training    PT Goals (Current goals can be found in the Care Plan section)  Acute Rehab PT Goals Patient Stated Goal: home PT Goal Formulation: With patient Time For Goal Achievement: 06/22/17 Potential to Achieve Goals: Good    Frequency Min 5X/week   Barriers to discharge        Co-evaluation               AM-PAC PT "6 Clicks" Daily Activity  Outcome Measure Difficulty turning over  in bed (including adjusting bedclothes, sheets and blankets)?: Unable Difficulty moving from lying on back to sitting on the side of the bed? : Unable Difficulty sitting down on and standing up from a chair with arms (e.g., wheelchair, bedside commode, etc,.)?: Unable Help needed moving to and from a bed to chair (including a wheelchair)?: A Lot Help needed walking in hospital room?: A Lot Help needed climbing 3-5 steps with a railing? : Total 6 Click Score: 8    End of Session   Activity Tolerance: Patient limited by pain;Patient limited by fatigue Patient left: in bed;with call bell/phone within reach Nurse Communication: Mobility status PT Visit Diagnosis: Other abnormalities of gait and mobility (R26.89);Muscle weakness (generalized) (M62.81);Pain    Time: 9449-6759 PT Time Calculation (min) (ACUTE ONLY): 14 min   Charges:   PT Evaluation $PT Eval Moderate Complexity: 1 Mod     PT G Codes:        Lorrin Goodell, PT  Office # [phone] Pager [phone]   [name] Shire 06/08/2017, 3:00 PM

## 2017-06-08 NOTE — Progress Notes (Signed)
Received orders from Dr. Christella Noa for a 500cc normal saline bolus, change out honeycomb dressing, and order a CBC.   Orders completed..will continue to monitor patient.

## 2017-06-08 NOTE — Progress Notes (Signed)
OT Cancellation Note  Patient Details Name: Kelsey Wilson MRN: [id number] DOB: Feb 27, 1961   Cancelled Treatment:    Reason Eval/Treat Not Completed: Pain limiting ability to participate;Medical issues which prohibited therapy. Pt reporting 9/10 pain in supine position and earlier drainage from incision which nurse assisted with. Noted Hgb is 7.1. Plan to reattempt later this morning once premedicated and if transfusion planned will wait until after transfusion.   Tyrone Schimke OTR/L Pager: [phone]  06/08/2017, 8:57 AM

## 2017-06-08 NOTE — Progress Notes (Signed)
OT Cancellation Note  Patient Details Name: Kelsey Wilson MRN: [id number] DOB: July 04, 1960   Cancelled Treatment:    Reason Eval/Treat Not Completed: Pain limiting ability to participate;Medical issues which prohibited therapy. Reattempted with pt reporting that she was advised by MD to take it easy today, rest. Pt citing issues with increased drainage when OOB today. Plan to reattempt tomorrow morning.   Tyrone Schimke OTR/L Pager: [phone]   06/08/2017, 1:36 PM

## 2017-06-08 NOTE — Progress Notes (Signed)
PT Cancellation Note  Patient Details Name: Kelsey Wilson MRN: [id number] DOB: [birth year]   Cancelled Treatment:    Reason Eval/Treat Not Completed: Pain limiting ability to participate;Medical issues which prohibited therapy. Pt reporting 8/10 pain, and declining participation in PT eval. Of note, pt with Hgb of 7.1. PT to re-attempt eval as time allows and pt able to participate.   [name] Shire 06/08/2017, 9:07 AM   Lorrin Goodell, PT  Office # [phone] Pager [phone]

## 2017-06-09 LAB — CBC
HCT: 26.4 % — ABNORMAL LOW (ref 36.0–46.0)
Hemoglobin: 8.4 g/dL — ABNORMAL LOW (ref 12.0–15.0)
MCH: 27.9 pg (ref 26.0–34.0)
MCHC: 31.8 g/dL (ref 30.0–36.0)
MCV: 87.7 fL (ref 78.0–100.0)
Platelets: 149 10*3/uL — ABNORMAL LOW (ref 150–400)
RBC: 3.01 MIL/uL — ABNORMAL LOW (ref 3.87–5.11)
RDW: 14.3 % (ref 11.5–15.5)
WBC: 7.5 10*3/uL (ref 4.0–10.5)

## 2017-06-09 NOTE — Progress Notes (Signed)
Physical Therapy Treatment Patient Details Name: Kelsey Wilson MRN: [id number] DOB: 1961-01-24 Today's Date: 06/09/2017    History of Present Illness Pt is a 57 y.o. female s/p PLIF.    PT Comments    Patient seen for mobility progression and increased activity tolerance. Patient is improving with mobility today, ambulated in hall but continues to require encouragement. Educated patient on mobility expectations, positional changes and precautions. Patient receptive. Will continue to see and progress as tolerated.   Follow Up Recommendations  Home health PT;Supervision/Assistance - 24 hour     Equipment Recommendations  Rolling walker with 5" wheels    Recommendations for Other Services       Precautions / Restrictions Precautions Precautions: Back Precaution Comments: Educated pt on 3/3 back precautions. Required Braces or Orthoses: Spinal Brace Spinal Brace: Applied in sitting position;Lumbar corset    Mobility  Bed Mobility               General bed mobility comments: received in chair  Transfers Overall transfer level: Needs assistance Equipment used: Rolling walker (2 wheeled) Transfers: Sit to/from Stand Sit to Stand: Min guard         General transfer comment: min guard for safety, VCs for positioning and technique  Ambulation/Gait Ambulation/Gait assistance: Min guard Ambulation Distance (Feet): 90 Feet Assistive device: Rolling walker (2 wheeled) Gait Pattern/deviations: Step-through pattern;Decreased stride length;Trunk flexed;Wide base of support Gait velocity: decreased Gait velocity interpretation: Below normal speed for age/gender General Gait Details: patient slow and guarded with ambulation, VCs for increased cadence and encouragement for mobility   Stairs            Wheelchair Mobility    Modified Rankin (Stroke Patients Only)       Balance Overall balance assessment: Needs assistance Sitting-balance support: No upper  extremity supported;Feet supported Sitting balance-Leahy Scale: Fair     Standing balance support: During functional activity Standing balance-Leahy Scale: Fair Standing balance comment: able to perform tasks in standing at counter                            Cognition Arousal/Alertness: Awake/alert Behavior During Therapy: WFL for tasks assessed/performed Overall Cognitive Status: Within Functional Limits for tasks assessed                                        Exercises      General Comments General comments (skin integrity, edema, etc.): Educated patient on mobility expectations and precautions      Pertinent Vitals/Pain Pain Assessment: Faces Faces Pain Scale: Hurts even more Pain Location: low back and hips Pain Descriptors / Indicators: Guarding;Grimacing;Sore Pain Intervention(s): Monitored during session    Home Living Family/patient expects to be discharged to:: Private residence Living Arrangements: Children Available Help at Discharge: Family;Available 24 hours/day Type of Home: Apartment Home Access: Level entry   Home Layout: One level Home Equipment: Cane - single point      Prior Function Level of Independence: Independent;Independent with assistive device(s)      Comments: cane for ambulation   PT Goals (current goals can now be found in the care plan section) Acute Rehab PT Goals Patient Stated Goal: home PT Goal Formulation: With patient Time For Goal Achievement: 06/22/17 Potential to Achieve Goals: Good Progress towards PT goals: Progressing toward goals    Frequency    Min  5X/week      PT Plan Current plan remains appropriate    Co-evaluation PT/OT/SLP Co-Evaluation/Treatment: Yes Reason for Co-Treatment: For patient/therapist safety PT goals addressed during session: Mobility/safety with mobility OT goals addressed during session: ADL's and self-care      AM-PAC PT "6 Clicks" Daily Activity   Outcome Measure  Difficulty turning over in bed (including adjusting bedclothes, sheets and blankets)?: A Little Difficulty moving from lying on back to sitting on the side of the bed? : A Little Difficulty sitting down on and standing up from a chair with arms (e.g., wheelchair, bedside commode, etc,.)?: A Little Help needed moving to and from a bed to chair (including a wheelchair)?: A Little Help needed walking in hospital room?: A Little Help needed climbing 3-5 steps with a railing? : A Lot 6 Click Score: 17    End of Session Equipment Utilized During Treatment: Gait belt;Back brace Activity Tolerance: Patient tolerated treatment well;Patient limited by fatigue;Patient limited by pain Patient left: in chair;with call bell/phone within reach;with nursing/sitter in room Nurse Communication: Mobility status PT Visit Diagnosis: Other abnormalities of gait and mobility (R26.89);Muscle weakness (generalized) (M62.81);Pain     Time: 5956-3875 PT Time Calculation (min) (ACUTE ONLY): 20 min  Charges:  $Gait Training: 8-22 mins                    G Codes:       Alben Deeds, PT DPT  Board Certified Neurologic Specialist [place] 06/09/2017, 10:17 AM

## 2017-06-09 NOTE — Progress Notes (Signed)
Patient ID: Kelsey Wilson, female   DOB: [birth year], 57 y.o.   MRN: [id number] BP (!) 103/58 (BP Location: Right Arm)   Pulse 83   Temp 98.4 F (36.9 C) (Oral)   Resp 20   SpO2 97%  Alert and oriented x 4, speech is clear and fluent Moving all extremities well Dressing has just been changed, drainage is slowing Continue with pt

## 2017-06-09 NOTE — Evaluation (Signed)
Occupational Therapy Evaluation Patient Details Name: Kelsey Wilson MRN: [id number] DOB: 1960/12/04 Today's Date: 06/09/2017    History of Present Illness Pt is a 57 y.o. female s/p PLIF.   Clinical Impression   PTA, pt was living with her mother and daughter and was independent. Pt currently requiring Mod-Max A for UB ADLs, Max A for LB ADLs, and Min Guard A for functional mobility with RW. Pt would benefit from further acute OT to increase safety and independence with ADLs and functional mobility. Recommend dc home with 24 hour supervision for safety once medically stable per physician.    Follow Up Recommendations  No OT follow up;Supervision/Assistance - 24 hour    Equipment Recommendations  3 in 1 bedside commode    Recommendations for Other Services PT consult     Precautions / Restrictions Precautions Precautions: Back Precaution Comments: Educated pt on 3/3 back precautions. Required Braces or Orthoses: Spinal Brace Spinal Brace: Applied in sitting position;Lumbar corset Restrictions Weight Bearing Restrictions: No      Mobility Bed Mobility               General bed mobility comments: In recliner upon arrival  Transfers Overall transfer level: Needs assistance Equipment used: Rolling walker (2 wheeled) Transfers: Sit to/from Stand Sit to Stand: Min guard         General transfer comment: min guard for safety, VCs for positioning and technique    Balance Overall balance assessment: Needs assistance Sitting-balance support: No upper extremity supported;Feet supported Sitting balance-Leahy Scale: Fair     Standing balance support: During functional activity Standing balance-Leahy Scale: Fair Standing balance comment: able to perform tasks in standing at sink                           ADL either performed or assessed with clinical judgement   ADL Overall ADL's : Needs assistance/impaired Eating/Feeding: Set up;Sitting   Grooming:  Min guard;Standing;Oral care Grooming Details (indicate cue type and reason): Educating pt on compensatory tehcniques for grooming and adherance to back precautions. Upper Body Bathing: Sitting;Moderate assistance   Lower Body Bathing: Maximal assistance;+2 for safety/equipment;Sit to/from stand   Upper Body Dressing : Maximal assistance;Sitting Upper Body Dressing Details (indicate cue type and reason): Max A to manage brace Lower Body Dressing: Maximal assistance;+2 for safety/equipment;Sit to/from stand Lower Body Dressing Details (indicate cue type and reason): Max A for adherance to precautions. Pt will need further education on AE Toilet Transfer: Minimal assistance;Ambulation;Cueing for sequencing;RW(Simulated to recliner)     Toileting - Clothing Manipulation Details (indicate cue type and reason): Educated pt on toilet hygiene after a BM and adherance to back precautions     Functional mobility during ADLs: Min guard;Rolling walker General ADL Comments: Pt dmeonstrating decreased functional performance and is limite by fatigue and pain     Vision         Perception     Praxis      Pertinent Vitals/Pain Pain Assessment: Faces Faces Pain Scale: Hurts even more Pain Location: low back and hips Pain Descriptors / Indicators: Guarding;Grimacing;Sore Pain Intervention(s): Monitored during session;Limited activity within patient's tolerance;Repositioned     Hand Dominance Right   Extremity/Trunk Assessment Upper Extremity Assessment Upper Extremity Assessment: Generalized weakness   Lower Extremity Assessment Lower Extremity Assessment: Generalized weakness   Cervical / Trunk Assessment Cervical / Trunk Assessment: Other exceptions Cervical / Trunk Exceptions: s/p PLIF and increased body habitus   Communication Communication  Communication: No difficulties   Cognition Arousal/Alertness: Awake/alert Behavior During Therapy: WFL for tasks  assessed/performed Overall Cognitive Status: Within Functional Limits for tasks assessed                                     General Comments       Exercises     Shoulder Instructions      Home Living Family/patient expects to be discharged to:: Private residence Living Arrangements: Children Available Help at Discharge: Family;Available 24 hours/day Type of Home: Apartment Home Access: Level entry     Home Layout: One level     Bathroom Shower/Tub: Tub/shower unit;Curtain   Biochemist, clinical: Standard     Home Equipment: Cane - single point          Prior Functioning/Environment Level of Independence: Independent;Independent with assistive device(s)        Comments: Performing ADLs and IADLs        OT Problem List: Decreased strength;Decreased range of motion;Decreased activity tolerance;Impaired balance (sitting and/or standing);Decreased knowledge of use of DME or AE;Decreased knowledge of precautions;Pain      OT Treatment/Interventions: Self-care/ADL training;Therapeutic exercise;Energy conservation;DME and/or AE instruction;Therapeutic activities;Patient/family education    OT Goals(Current goals can be found in the care plan section) Acute Rehab OT Goals Patient Stated Goal: home OT Goal Formulation: With patient Time For Goal Achievement: 06/23/17 Potential to Achieve Goals: Good ADL Goals Pt Will Perform Lower Body Bathing: with min guard assist;sit to/from stand;with adaptive equipment;with caregiver independent in assisting Pt Will Perform Lower Body Dressing: with min guard assist;sit to/from stand;with adaptive equipment;with caregiver independent in assisting Pt Will Transfer to Toilet: with min guard assist;ambulating;bedside commode Pt Will Perform Toileting - Clothing Manipulation and hygiene: with min guard assist;sit to/from stand Pt Will Perform Tub/Shower Transfer: Tub transfer;with min guard assist;ambulating;3 in 1;rolling  walker Additional ADL Goal #1: Pt will independently verbalize 3/3 back precautions  OT Frequency: Min 3X/week   Barriers to D/C:            Co-evaluation   Reason for Co-Treatment: For patient/therapist safety PT goals addressed during session: Mobility/safety with mobility OT goals addressed during session: ADL's and self-care      AM-PAC PT "6 Clicks" Daily Activity     Outcome Measure Help from another person eating meals?: None Help from another person taking care of personal grooming?: None Help from another person toileting, which includes using toliet, bedpan, or urinal?: A Little Help from another person bathing (including washing, rinsing, drying)?: A Lot Help from another person to put on and taking off regular upper body clothing?: A Lot Help from another person to put on and taking off regular lower body clothing?: A Lot 6 Click Score: 17   End of Session Equipment Utilized During Treatment: Gait belt;Rolling walker;Back brace Nurse Communication: Mobility status;Precautions  Activity Tolerance: Patient tolerated treatment well;Patient limited by fatigue;Patient limited by pain Patient left: in chair;with call bell/phone within reach;with chair alarm set;with nursing/sitter in room  OT Visit Diagnosis: Unsteadiness on feet (R26.81);Other abnormalities of gait and mobility (R26.89);Muscle weakness (generalized) (M62.81);Pain Pain - part of body: (Back)                Time: 5397-6734 OT Time Calculation (min): 19 min Charges:  OT General Charges $OT Visit: 1 Visit OT Evaluation $OT Eval Moderate Complexity: 1 Mod G-Codes:     Veida Spira MSOT, OTR/L Acute  Rehab Pager: [phone] Office: [place] 06/09/2017, 1:28 PM

## 2017-06-10 ENCOUNTER — Encounter (HOSPITAL_COMMUNITY): Payer: Self-pay | Admitting: Neurological Surgery

## 2017-06-10 NOTE — Plan of Care (Signed)
  Progressing Safety: Ability to remain free from injury will improve 06/10/2017 0946 - Progressing by Harlin Heys, RN

## 2017-06-10 NOTE — Progress Notes (Signed)
Patient ID: Kelsey Wilson, female   DOB: Feb 13, 1961, 57 y.o.   MRN: [id number] Subjective: Patient reports no headache, appropriate back and hip soreness   Objective: Vital signs in last 24 hours: Temp:  [98 F (36.7 C)-98.8 F (37.1 C)] 98.2 F (36.8 C) (01/07 0620) Pulse Rate:  [78-97] 81 (01/07 0620) Resp:  [18-20] 18 (01/07 0620) BP: (81-133)/(38-65) 107/53 (01/07 0620) SpO2:  [95 %-100 %] 99 % (01/07 0620)  Intake/Output from previous day: 01/06 0701 - 01/07 0700 In: 560 [P.O.:560] Out: 4 [Urine:4] Intake/Output this shift: Total I/O In: -  Out: 1 [Urine:1]  Neurologic: Grossly normal, wound with serosanguinous drainage but looks quite clean  Lab Results: Lab Results  Component Value Date   WBC 7.5 06/09/2017   HGB 8.4 (L) 06/09/2017   HCT 26.4 (L) 06/09/2017   MCV 87.7 06/09/2017   PLT 149 (L) 06/09/2017   Lab Results  Component Value Date   INR 0.89 05/29/2017   BMET Lab Results  Component Value Date   NA 137 05/29/2017   K 3.5 05/29/2017   CL 98 (L) 05/29/2017   CO2 29 05/29/2017   GLUCOSE 124 (H) 05/29/2017   BUN 13 05/29/2017   CREATININE 0.90 05/29/2017   CALCIUM 9.7 05/29/2017    Studies/Results: No results found.  Assessment/Plan: Doing ok but still slow to move, PT/OT today, wound stapled and that seems to have slowed the serosanguinous drainage, if pain better controlled and wound dry tomorrow will d.c home   LOS: 3 days    Camela Wich S 06/10/2017, 8:23 AM

## 2017-06-10 NOTE — Progress Notes (Signed)
Physical Therapy Treatment Patient Details Name: Kelsey Wilson MRN: [id number] DOB: 23-Mar-1961 Today's Date: 06/10/2017    History of Present Illness Pt is a 57 y.o. female s/p PLIF. PMH significant of carpal tunnel, fibromyalgia, depression, anxiety, chronic back pain, diverticulitis of colon, arthritis, breast cancer, and peripheral neuropathy.     PT Comments    Pt progresses towards PT goals, tolerating 110-ft ambulation with RW and supervision. Pt able to recall 3/3 back precautions. Educated pt on log roll and safe bed mobility. Bed was adjusted to replicate home environment. Pt able to demonstrate all bed mobility with verbal instructions and only required physical assist (minA) for bringing legs back onto bed during sit to side-lying. Encouraged pt to sit up in chair whenever possible, but not for more than 30 minutes at a time. Pt able to don brace without physical assist. Current discharge plan remains appropriate. PT will follow acutely in order to maximize functional mobility while in hospital setting.    Follow Up Recommendations  Home health PT;Supervision/Assistance - 24 hour     Equipment Recommendations  Rolling walker with 5" wheels    Recommendations for Other Services       Precautions / Restrictions Precautions Precautions: Back Precaution Comments: Educated pt on 3/3 back precautions. Required Braces or Orthoses: Spinal Brace Spinal Brace: Applied in sitting position;Lumbar corset Restrictions Weight Bearing Restrictions: No    Mobility  Bed Mobility Overal bed mobility: Needs Assistance Bed Mobility: Rolling;Sidelying to Sit;Sit to Sidelying Rolling: Supervision Sidelying to sit: Supervision     Sit to sidelying: Min assist General bed mobility comments: Educated pt on log roll and push up with verbal cueing for all bed mobility. Pt demonstrated bed mobility without railing and with flat bed to replicate home environment. Pt requiring min physical  assist to bring legs back up to bed for sit to sidelying.   Transfers Overall transfer level: Needs assistance Equipment used: Rolling walker (2 wheeled) Transfers: Sit to/from Stand Sit to Stand: Supervision         General transfer comment: VCs for hand placement on bed. STS x1 from EOB. Pt in bed post-ambulation, as pt wanting to take nap.   Ambulation/Gait Ambulation/Gait assistance: Supervision Ambulation Distance (Feet): 110 Feet Assistive device: Rolling walker (2 wheeled) Gait Pattern/deviations: Step-through pattern;Decreased stride length;Trunk flexed;Wide base of support Gait velocity: decreased   General Gait Details: Pt ambulates with slow and guarded movements. Trunk flexed initally, however, pt able to maintain upright posture after VCs to stand up straight and step into walker.    Stairs            Wheelchair Mobility    Modified Rankin (Stroke Patients Only)       Balance Overall balance assessment: Needs assistance Sitting-balance support: No upper extremity supported;Feet supported Sitting balance-Leahy Scale: Fair Sitting balance - Comments: Pt able to maintain sitting EOB without external support, however requires assistance to maintain balance with weight shifting.      Standing balance-Leahy Scale: Fair Standing balance comment: Pt able to stand without external support, however requires support from RW or therapist for dynamic standing activities.                             Cognition Arousal/Alertness: Awake/alert Behavior During Therapy: WFL for tasks assessed/performed Overall Cognitive Status: Within Functional Limits for tasks assessed  Exercises      General Comments General comments (skin integrity, edema, etc.): Educated pt on safety with RW, 3/3 back precautions (pt able to teach back 3/3), and getting into and out of bed at home safely.       Pertinent  Vitals/Pain Pain Assessment: 0-10 Pain Score: 8  Pain Location: left low back Pain Descriptors / Indicators: Grimacing Pain Intervention(s): Limited activity within patient's tolerance;Monitored during session    Home Living                      Prior Function            PT Goals (current goals can now be found in the care plan section) Acute Rehab PT Goals Patient Stated Goal: home Progress towards PT goals: Progressing toward goals    Frequency    Min 5X/week      PT Plan Current plan remains appropriate    Co-evaluation              AM-PAC PT "6 Clicks" Daily Activity  Outcome Measure  Difficulty turning over in bed (including adjusting bedclothes, sheets and blankets)?: A Little Difficulty moving from lying on back to sitting on the side of the bed? : A Little Difficulty sitting down on and standing up from a chair with arms (e.g., wheelchair, bedside commode, etc,.)?: A Little Help needed moving to and from a bed to chair (including a wheelchair)?: A Little Help needed walking in hospital room?: A Little Help needed climbing 3-5 steps with a railing? : A Lot 6 Click Score: 17    End of Session Equipment Utilized During Treatment: Gait belt;Back brace Activity Tolerance: Patient tolerated treatment well;Patient limited by fatigue;Patient limited by pain Patient left: in bed;with bed alarm set;with call bell/phone within reach   PT Visit Diagnosis: Other abnormalities of gait and mobility (R26.89);Muscle weakness (generalized) (M62.81);Pain     Time: 1572-6203 PT Time Calculation (min) (ACUTE ONLY): 19 min  Charges:  $Therapeutic Activity: 8-22 mins                    G Codes:       Judee Clara, SPT   Judee Clara 06/10/2017, 10:49 AM

## 2017-06-10 NOTE — Progress Notes (Signed)
Occupational Therapy Treatment Patient Details Name: Kelsey Wilson MRN: [id number] DOB: 08/08/1960 Today's Date: 06/10/2017    History of present illness Pt is a 57 y.o. female s/p PLIF. PMH significant of carpal tunnel, fibromyalgia, depression, anxiety, chronic back pain, diverticulitis of colon, arthritis, breast cancer, and peripheral neuropathy.    OT comments  Pt progressing towards established OT goals. Providing education on LB ADLs with AE. Pt donning underwear with AE and Min Guard for safety in standing. Provided handout with AE information. Will continue to follow to address safe tub transfers. Continue to recommend dc home once medically stable.   Follow Up Recommendations  No OT follow up;Supervision/Assistance - 24 hour    Equipment Recommendations  3 in 1 bedside commode    Recommendations for Other Services PT consult    Precautions / Restrictions Precautions Precautions: Back Precaution Comments: Pt able to recall 3/3 precautions Required Braces or Orthoses: Spinal Brace Spinal Brace: Applied in sitting position;Lumbar corset Restrictions Weight Bearing Restrictions: No       Mobility Bed Mobility               General bed mobility comments: In recliner upon arrival  Transfers Overall transfer level: Needs assistance Equipment used: Rolling walker (2 wheeled) Transfers: Sit to/from Stand Sit to Stand: Min guard         General transfer comment: Min Guard for safety    Balance Overall balance assessment: Needs assistance Sitting-balance support: No upper extremity supported;Feet supported Sitting balance-Leahy Scale: Fair Sitting balance - Comments: Pt able to maintain sitting EOB without external support, however requires assistance to maintain balance with weight shifting.    Standing balance support: During functional activity Standing balance-Leahy Scale: Fair Standing balance comment: Pt able to stand without external support,  however requires support from RW or therapist for dynamic standing activities.                            ADL either performed or assessed with clinical judgement   ADL Overall ADL's : Needs assistance/impaired       Grooming Details (indicate cue type and reason): Reviewed using cup for oral care to prevent bending forward       Lower Body Bathing Details (indicate cue type and reason): Educated pt on AE for LB ADLs. Pt verbalized understanding.     Lower Body Dressing: Sit to/from stand;Min guard;With adaptive equipment;Cueing for sequencing;Adhering to back precautions Lower Body Dressing Details (indicate cue type and reason): Educated pt on AE for LB ADLs. Pt donning underwear with Min guard for safety in standing.            Tub/Shower Transfer Details (indicate cue type and reason): Pt will need further education on safe tub transfers   General ADL Comments: Pt demonstrating understanding of AE and LB ADLs. Pt presenting with increased process and funcitonal performance. Provided handout for AE.     Vision       Perception     Praxis      Cognition Arousal/Alertness: Awake/alert Behavior During Therapy: WFL for tasks assessed/performed Overall Cognitive Status: Within Functional Limits for tasks assessed                                          Exercises     Shoulder Instructions  General Comments Pt reporting that Dr. Ronnald Ramp states she can sit in recliner without brace and to don brace when walking    Pertinent Vitals/ Pain       Pain Assessment: Faces Faces Pain Scale: Hurts little more Pain Location: left low back Pain Descriptors / Indicators: Grimacing Pain Intervention(s): Monitored during session;Repositioned;Patient requesting pain meds-RN notified  Home Living Family/patient expects to be discharged to:: Private residence Living Arrangements: Children Available Help at Discharge: Family;Available 24  hours/day Type of Home: Apartment Home Access: Level entry     Home Layout: One level     Bathroom Shower/Tub: Tub/shower unit;Curtain   Biochemist, clinical: Standard     Home Equipment: Cane - single point          Prior Functioning/Environment Level of Independence: Independent;Independent with assistive device(s)        Comments: Performing ADLs and IADLs   Frequency  Min 3X/week        Progress Toward Goals  OT Goals(current goals can now be found in the care plan section)  Progress towards OT goals: Progressing toward goals  Acute Rehab OT Goals Patient Stated Goal: home OT Goal Formulation: With patient Time For Goal Achievement: 06/23/17 Potential to Achieve Goals: Good ADL Goals Pt Will Perform Lower Body Bathing: with min guard assist;sit to/from stand;with adaptive equipment;with caregiver independent in assisting Pt Will Perform Lower Body Dressing: with min guard assist;sit to/from stand;with adaptive equipment;with caregiver independent in assisting Pt Will Transfer to Toilet: with min guard assist;ambulating;bedside commode Pt Will Perform Toileting - Clothing Manipulation and hygiene: with min guard assist;sit to/from stand Pt Will Perform Tub/Shower Transfer: Tub transfer;with min guard assist;ambulating;3 in 1;rolling walker Additional ADL Goal #1: Pt will independently verbalize 3/3 back precautions  Plan Discharge plan remains appropriate;Frequency remains appropriate    Co-evaluation                 AM-PAC PT "6 Clicks" Daily Activity     Outcome Measure   Help from another person eating meals?: None Help from another person taking care of personal grooming?: None Help from another person toileting, which includes using toliet, bedpan, or urinal?: A Little Help from another person bathing (including washing, rinsing, drying)?: A Little Help from another person to put on and taking off regular upper body clothing?: A Lot Help from  another person to put on and taking off regular lower body clothing?: A Little 6 Click Score: 19    End of Session Equipment Utilized During Treatment: Gait belt;Rolling walker  OT Visit Diagnosis: Unsteadiness on feet (R26.81);Other abnormalities of gait and mobility (R26.89);Muscle weakness (generalized) (M62.81);Pain Pain - part of body: (Back)   Activity Tolerance Patient tolerated treatment well   Patient Left in chair;with call bell/phone within reach   Nurse Communication Mobility status;Precautions;Patient requests pain meds        Time: 2563-8937 OT Time Calculation (min): 17 min  Charges: OT General Charges $OT Visit: 1 Visit OT Treatments $Self Care/Home Management : 8-22 mins  [place], OTR/L Acute Rehab Pager: [phone] Office: [place] 06/10/2017, 4:43 PM

## 2017-06-11 MED ORDER — DEXAMETHASONE SODIUM PHOSPHATE 4 MG/ML IJ SOLN
4.0000 mg | Freq: Four times a day (QID) | INTRAMUSCULAR | Status: AC
  Administered 2017-06-11 (×3): 4 mg via INTRAVENOUS
  Filled 2017-06-11 (×3): qty 1

## 2017-06-11 MED ORDER — HYDROCODONE-ACETAMINOPHEN 7.5-325 MG PO TABS
1.0000 | ORAL_TABLET | ORAL | Status: DC | PRN
  Administered 2017-06-11 – 2017-06-12 (×4): 2 via ORAL
  Filled 2017-06-11 (×4): qty 2

## 2017-06-11 MED ORDER — TIZANIDINE HCL 4 MG PO TABS: 4.0000 mg | ORAL_TABLET | Freq: Three times a day (TID) | ORAL | Status: DC | PRN

## 2017-06-11 NOTE — Care Management Important Message (Signed)
Important Message  Patient Details  Name: Kelsey Wilson MRN: [id number] Date of Birth: 03-23-1961   Medicare Important Message Given:  Yes    Nathen May 06/11/2017, 9:58 AM

## 2017-06-11 NOTE — Progress Notes (Signed)
Patient ID: Kelsey Wilson, female   DOB: Mar 21, 1961, 57 y.o.   MRN: [id number] Subjective: Patient reports some headache, some significant leg aching but no NTW  Objective: Vital signs in last 24 hours: Temp:  [97.8 F (36.6 C)-99.1 F (37.3 C)] 98.1 F (36.7 C) (01/08 0615) Pulse Rate:  [77-87] 77 (01/08 0615) Resp:  [18-20] 18 (01/08 0615) BP: (106-123)/(52-66) 112/58 (01/08 0615) SpO2:  [93 %-99 %] 99 % (01/08 0615)  Intake/Output from previous day: 01/07 0701 - 01/08 0700 In: 178.5 [I.V.:178.5] Out: 1 [Urine:1] Intake/Output this shift: No intake/output data recorded.  Neurologic: Grossly normal, wound CDI, some serosanguinous drainage on dressing but I cannot express any when dressing off for wound check, wound looks pristine  Lab Results: Lab Results  Component Value Date   WBC 7.5 06/09/2017   HGB 8.4 (L) 06/09/2017   HCT 26.4 (L) 06/09/2017   MCV 87.7 06/09/2017   PLT 149 (L) 06/09/2017   Lab Results  Component Value Date   INR 0.89 05/29/2017   BMET Lab Results  Component Value Date   NA 137 05/29/2017   K 3.5 05/29/2017   CL 98 (L) 05/29/2017   CO2 29 05/29/2017   GLUCOSE 124 (H) 05/29/2017   BUN 13 05/29/2017   CREATININE 0.90 05/29/2017   CALCIUM 9.7 05/29/2017    Studies/Results: No results found.  Assessment/Plan: Slow to mobilize, suspect radiculitis is slowing her progress so will try some decadron today, drainage seems somewhat less, continue to encourage mobilization   LOS: 4 days    [name] S 06/11/2017, 7:55 AM

## 2017-06-11 NOTE — Progress Notes (Signed)
Occupational Therapy Treatment Patient Details Name: Kelsey Wilson MRN: [id number] DOB: 24-Sep-1960 Today's Date: 06/11/2017    History of present illness Pt is a 57 y.o. female s/p PLIF. PMH significant of carpal tunnel, fibromyalgia, depression, anxiety, chronic back pain, diverticulitis of colon, arthritis, breast cancer, and peripheral neuropathy.    OT comments  Pt progressing towards established OT goals. Pt performing tub transfer with shower seat and Min Guard A for safety. Discussed use of hand held shower head to optimize independence at home. Reviewed all education and answered pt's questions. All acute OT needs met and will sign off. Thank you.   Follow Up Recommendations  No OT follow up;Supervision/Assistance - 24 hour    Equipment Recommendations  3 in 1 bedside commode    Recommendations for Other Services PT consult    Precautions / Restrictions Precautions Precautions: Back Precaution Comments: Pt able to recall 3/3 precautions Required Braces or Orthoses: Spinal Brace Spinal Brace: Applied in sitting position;Lumbar corset Restrictions Weight Bearing Restrictions: No       Mobility Bed Mobility               General bed mobility comments: OOB with PT  Transfers Overall transfer level: Needs assistance Equipment used: Rolling walker (2 wheeled) Transfers: Sit to/from Stand Sit to Stand: Supervision         General transfer comment: supervision for safety    Balance Overall balance assessment: Needs assistance Sitting-balance support: No upper extremity supported;Feet supported Sitting balance-Leahy Scale: Good Sitting balance - Comments: Pt demonstrates dynamic sitting balance, utilizing BUEs to eat her breakfast without LOB.    Standing balance support: During functional activity Standing balance-Leahy Scale: Fair Standing balance comment: Pt able to stand without external support, however requires support from RW or therapist for  dynamic standing activities.                            ADL either performed or assessed with clinical judgement   ADL Overall ADL's : Needs assistance/impaired       Grooming Details (indicate cue type and reason): Reviewed using cup for oral care to prevent bending forward       Lower Body Bathing Details (indicate cue type and reason): Reviewed eudcation on safe bathing techniques Upper Body Dressing : Set up;Supervision/safety;Cueing for sequencing;Sitting Upper Body Dressing Details (indicate cue type and reason): Pt doffing brace with MIn VCs for placing of straps   Lower Body Dressing Details (indicate cue type and reason): Reviewed education on safe LB dressing with AE Toilet Transfer: (Simulated to recliner)       Tub/ Shower Transfer: Tub transfer;Min guard;Ambulation;Rolling walker;Shower seat;Cueing for Higher education careers adviser Details (indicate cue type and reason): Pt performing tub transfer with MIn Guard A and VCs for safe technique. Educated pt on use of hand held shower head to optimize independence and safety with bathing Functional mobility during ADLs: Min guard;Rolling walker General ADL Comments: Reviewed all education and answering pt's questions in prerpation for dc.     Vision       Perception     Praxis      Cognition Arousal/Alertness: Awake/alert Behavior During Therapy: WFL for tasks assessed/performed Overall Cognitive Status: Within Functional Limits for tasks assessed  Exercises     Shoulder Instructions       General Comments Pt handed off to OT after ambulation in gym area for tub transfer training.     Pertinent Vitals/ Pain       Pain Assessment: Faces Pain Score: 8  Faces Pain Scale: Hurts little more Pain Location: left lateral hip Pain Descriptors / Indicators: Aching Pain Intervention(s): Monitored during session;Limited activity within patient's  tolerance;Repositioned  Home Living                                          Prior Functioning/Environment              Frequency  Min 3X/week        Progress Toward Goals  OT Goals(current goals can now be found in the care plan section)  Progress towards OT goals: Goals met/education completed, patient discharged from OT  Acute Rehab OT Goals Patient Stated Goal: home OT Goal Formulation: With patient Time For Goal Achievement: 06/23/17 Potential to Achieve Goals: Good ADL Goals Pt Will Perform Lower Body Bathing: with min guard assist;sit to/from stand;with adaptive equipment;with caregiver independent in assisting Pt Will Perform Lower Body Dressing: with min guard assist;sit to/from stand;with adaptive equipment;with caregiver independent in assisting Pt Will Transfer to Toilet: with min guard assist;ambulating;bedside commode Pt Will Perform Toileting - Clothing Manipulation and hygiene: with min guard assist;sit to/from stand Pt Will Perform Tub/Shower Transfer: Tub transfer;with min guard assist;ambulating;3 in 1;rolling walker Additional ADL Goal #1: Pt will independently verbalize 3/3 back precautions  Plan Discharge plan remains appropriate;Frequency remains appropriate;All goals met and education completed, patient discharged from OT services    Co-evaluation                 AM-PAC PT "6 Clicks" Daily Activity     Outcome Measure   Help from another person eating meals?: None Help from another person taking care of personal grooming?: None Help from another person toileting, which includes using toliet, bedpan, or urinal?: A Little Help from another person bathing (including washing, rinsing, drying)?: A Little Help from another person to put on and taking off regular upper body clothing?: A Lot Help from another person to put on and taking off regular lower body clothing?: A Little 6 Click Score: 19    End of Session Equipment  Utilized During Treatment: Gait belt;Rolling walker;Back brace  OT Visit Diagnosis: Unsteadiness on feet (R26.81);Other abnormalities of gait and mobility (R26.89);Muscle weakness (generalized) (M62.81);Pain Pain - part of body: (Back)   Activity Tolerance Patient tolerated treatment well   Patient Left in chair;with call bell/phone within reach   Nurse Communication Mobility status;Precautions;Other (comment)(Pt requesting benadryl)        Time: 1884-1660 OT Time Calculation (min): 14 min  Charges: OT General Charges $OT Visit: 1 Visit OT Treatments $Self Care/Home Management : 8-22 mins  [place], OTR/L Acute Rehab Pager: [phone] Office: [place] 06/11/2017, 9:17 AM

## 2017-06-11 NOTE — Progress Notes (Signed)
Reinforced dressing which continues to leak used ABD pad, it is difficult to bandage due patients body contours leakage is minimal serious sanguinous fluid. will monitor.

## 2017-06-11 NOTE — Progress Notes (Signed)
Physical Therapy Treatment Patient Details Name: Kelsey Wilson MRN: [id number] DOB: [birth year] Today's Date: 06/11/2017    History of Present Illness Pt is a 57 y.o. female s/p PLIF. PMH significant of carpal tunnel, fibromyalgia, depression, anxiety, chronic back pain, diverticulitis of colon, arthritis, breast cancer, and peripheral neuropathy.     PT Comments    Pt progressed towards PT goals today, demonstrating increased tolerance to activity, walking 200-ft with supervision and RW and without requiring any rest breaks. Pt demonstrated improvements in gait pattern, ambulating with increased cadence and increased stride length as compared to last PT session, however is still lacking in both of these areas. Pt is able to self-correct and maintain correction after VCs to stand upright and within RW. Pt transferred to OT care at end of session in gym in order to commence tub transfer training. Current discharge plan remains appropriate. PT will follow acutely in order to maximize mobility while in hospital setting.   Follow Up Recommendations  Home health PT;Supervision for mobility/OOB     Equipment Recommendations  Rolling walker with 5" wheels    Recommendations for Other Services       Precautions / Restrictions Precautions Precautions: Back Required Braces or Orthoses: Spinal Brace Spinal Brace: Applied in sitting position;Lumbar corset Restrictions Weight Bearing Restrictions: No    Mobility  Bed Mobility               General bed mobility comments: In recliner upon arrival  Transfers Overall transfer level: Needs assistance Equipment used: Rolling walker (2 wheeled) Transfers: Sit to/from Stand Sit to Stand: Supervision         General transfer comment: STS x1 from chair. Pt is slow to rise, but maintains steadiness with smooth transfer of hand placement from chair to RW.   Ambulation/Gait Ambulation/Gait assistance: Supervision Ambulation Distance  (Feet): 200 Feet Assistive device: Rolling walker (2 wheeled) Gait Pattern/deviations: Step-through pattern;Decreased stride length Gait velocity: decreased   General Gait Details: Pt with increased stride length today as compared to yesterday, however still below average. Pt able to tolerate more activity and maintains standing upright and within RW after VCx1.    Stairs            Wheelchair Mobility    Modified Rankin (Stroke Patients Only)       Balance Overall balance assessment: Needs assistance Sitting-balance support: No upper extremity supported;Feet supported Sitting balance-Leahy Scale: Good Sitting balance - Comments: Pt demonstrates dynamic sitting balance, utilizing BUEs to eat her breakfast without LOB.      Standing balance-Leahy Scale: Fair Standing balance comment: Pt able to stand without external support, however requires support from RW or therapist for dynamic standing activities.                             Cognition Arousal/Alertness: Awake/alert Behavior During Therapy: WFL for tasks assessed/performed Overall Cognitive Status: Within Functional Limits for tasks assessed                                        Exercises      General Comments General comments (skin integrity, edema, etc.): Pt handed off to OT after ambulation in gym area for tub transfer training.       Pertinent Vitals/Pain Pain Assessment: 0-10 Pain Score: 8  Pain Location: left lateral hip Pain Descriptors /  Indicators: Aching Pain Intervention(s): Limited activity within patient's tolerance;Monitored during session    Home Living                      Prior Function            PT Goals (current goals can now be found in the care plan section) Progress towards PT goals: Progressing toward goals    Frequency    Min 5X/week      PT Plan Current plan remains appropriate    Co-evaluation              AM-PAC PT "6  Clicks" Daily Activity  Outcome Measure  Difficulty turning over in bed (including adjusting bedclothes, sheets and blankets)?: A Little Difficulty moving from lying on back to sitting on the side of the bed? : A Little Difficulty sitting down on and standing up from a chair with arms (e.g., wheelchair, bedside commode, etc,.)?: A Little Help needed moving to and from a bed to chair (including a wheelchair)?: A Little Help needed walking in hospital room?: A Little Help needed climbing 3-5 steps with a railing? : A Lot 6 Click Score: 17    End of Session Equipment Utilized During Treatment: Gait belt;Back brace Activity Tolerance: Patient tolerated treatment well Patient left: Other (comment)(with OT in gym for tub transfer training) Nurse Communication: Mobility status PT Visit Diagnosis: Other abnormalities of gait and mobility (R26.89);Muscle weakness (generalized) (M62.81);Pain     Time: 4076-8088 PT Time Calculation (min) (ACUTE ONLY): 9 min  Charges:  $Gait Training: 8-22 mins                    G Codes:       Judee Clara, SPT   Judee Clara 06/11/2017, 9:08 AM

## 2017-06-12 MED ORDER — HYDROCODONE-ACETAMINOPHEN 7.5-325 MG PO TABS: 1.0000 | ORAL_TABLET | Freq: Four times a day (QID) | ORAL | 0 refills | Status: DC | PRN

## 2017-06-12 MED ORDER — TIZANIDINE HCL 4 MG PO TABS: 4.0000 mg | ORAL_TABLET | Freq: Three times a day (TID) | ORAL | 1 refills | Status: DC | PRN

## 2017-06-12 MED ORDER — DIPHENHYDRAMINE HCL 50 MG/ML IJ SOLN
25.0000 mg | Freq: Once | INTRAMUSCULAR | Status: AC
  Administered 2017-06-12: 25 mg via INTRAVENOUS
  Filled 2017-06-12: qty 1

## 2017-06-12 NOTE — Progress Notes (Signed)
   06/12/17 1000  Clinical Encounter Type  Visited With Patient  Visit Type Follow-up  Consult/Referral To Chaplain  Spiritual Encounters  Spiritual Needs Prayer  Chaplain visited with the PT.  PT is happy to be leaving the hospital today to go home.  The PT did not express any needs, just excitements.

## 2017-06-12 NOTE — Progress Notes (Signed)
Patient ambulated in hall about 350 feet she is feeling much better.

## 2017-06-12 NOTE — Care Management Note (Addendum)
Case Management Note  Patient Details  Name: Kelsey Wilson MRN: [id number] Date of Birth: 1961/03/09  Subjective/Objective:     Pt s/p lumbar surgery. She is from home with family.            PCP:  Dr Elayne Snare number: [phone]  Action/Plan: Pt discharging home with [facility] services. CM met with the patient and provided her choice. She selected [facility]. Cory with [facility] notified and accepted the referral.  Pt with orders for walker and 3 in 1. Pt states she has a 3 in 1 but needs the walker. Butch Penny with [facility] notified and will have walker delivered to the room.  Pt has transportation home.  Expected Discharge Date:  06/12/17               Expected Discharge Plan:  [place]  In-House Referral:     Discharge planning Services  CM Consult  Post Acute Care Choice:  Durable Medical Equipment, Home Health Choice offered to:  Patient  DME Arranged:  Walker rolling(pt refused 3 in 1) DME Agency:  [place]:  PT [place]:  [place]  Status of Service:  Completed, signed off  If discussed at [place] of Stay Meetings, dates discussed:    Additional Comments:  Pollie Friar, RN 06/12/2017, 10:20 AM

## 2017-06-12 NOTE — Discharge Summary (Signed)
Physician Discharge Summary  Patient ID: Kelsey Wilson MRN: [id number] DOB/AGE: Oct 28, 1960 57 y.o.  Admit date: 06/07/2017 Discharge date: 06/12/2017  Admission Diagnoses: spondylolisthesis/ stenosis    Discharge Diagnoses: same   Discharged Condition: good  Hospital Course: The patient was admitted on 06/07/2017 and taken to the operating room where the patient underwent lumbar fusion L3-5. The patient tolerated the procedure well and was taken to the recovery room and then to the floor in stable condition. The hospital course was routine. There were no complications. The wound remained clean dry and intact. Pt had appropriate back soreness. No complaints of leg pain or new N/T/W. The patient remained afebrile with stable vital signs, and tolerated a regular diet. The patient continued to increase activities, and pain was well controlled with oral pain medications.   Consults: None  Significant Diagnostic Studies:  Results for orders placed or performed during the hospital encounter of 06/07/17  CBC  Result Value Ref Range   WBC 6.8 4.0 - 10.5 K/uL   RBC 2.49 (L) 3.87 - 5.11 MIL/uL   Hemoglobin 7.1 (L) 12.0 - 15.0 g/dL   HCT 21.6 (L) 36.0 - 46.0 %   MCV 86.7 78.0 - 100.0 fL   MCH 28.5 26.0 - 34.0 pg   MCHC 32.9 30.0 - 36.0 g/dL   RDW 13.9 11.5 - 15.5 %   Platelets 132 (L) 150 - 400 K/uL  Glucose, capillary  Result Value Ref Range   Glucose-Capillary 113 (H) 65 - 99 mg/dL   Comment 1 Notify RN    Comment 2 Document in Chart   CBC  Result Value Ref Range   WBC 7.5 4.0 - 10.5 K/uL   RBC 3.01 (L) 3.87 - 5.11 MIL/uL   Hemoglobin 8.4 (L) 12.0 - 15.0 g/dL   HCT 26.4 (L) 36.0 - 46.0 %   MCV 87.7 78.0 - 100.0 fL   MCH 27.9 26.0 - 34.0 pg   MCHC 31.8 30.0 - 36.0 g/dL   RDW 14.3 11.5 - 15.5 %   Platelets 149 (L) 150 - 400 K/uL    Chest 2 View  Result Date: 05/29/2017 CLINICAL DATA:  Preoperative evaluation for spinal surgery. History of breast carcinoma EXAM: CHEST  2 VIEW  COMPARISON:  February 16, 2016 FINDINGS: There is no edema or consolidation. Heart is borderline enlarged with pulmonary vascularity within normal limits. No adenopathy. There are surgical clips in left axillary region. There are no blastic or lytic bone lesions. IMPRESSION: Heart borderline enlarged.  No edema or consolidation. Electronically Signed   By: Lowella Grip III M.D.   On: 05/29/2017 11:22   Dg Lumbar Spine 2-3 Views  Result Date: 06/07/2017 CLINICAL DATA:  Posterolateral fusion lumbar 3-4 and interbody fusion lumbar 4-5 EXAM: DG C-ARM 61-120 MIN; LUMBAR SPINE - 2-3 VIEW COMPARISON:  None. FINDINGS: AP and lateral intraoperative fluoroscopic images demonstrating pedicular screws at the L3 through L5 levels with intervening disc spacers/cages. Hardware appears appropriately positioned. No evidence of surgical complicating feature. Fluoroscopy was provided for 2 minutes and 2 seconds. IMPRESSION: Intraoperative fluoroscopic images demonstrating fixation hardware at the L3 through L5 levels. Hardware appears appropriately positioned. No evidence of surgical complicating feature. Electronically Signed   By: Franki Cabot M.D.   On: 06/07/2017 12:01   Dg C-arm 1-60 Min  Result Date: 06/07/2017 CLINICAL DATA:  Posterolateral fusion lumbar 3-4 and interbody fusion lumbar 4-5 EXAM: DG C-ARM 61-120 MIN; LUMBAR SPINE - 2-3 VIEW COMPARISON:  None. FINDINGS: AP and lateral  intraoperative fluoroscopic images demonstrating pedicular screws at the L3 through L5 levels with intervening disc spacers/cages. Hardware appears appropriately positioned. No evidence of surgical complicating feature. Fluoroscopy was provided for 2 minutes and 2 seconds. IMPRESSION: Intraoperative fluoroscopic images demonstrating fixation hardware at the L3 through L5 levels. Hardware appears appropriately positioned. No evidence of surgical complicating feature. Electronically Signed   By: Franki Cabot M.D.   On: 06/07/2017 12:01    Mm Diag Breast Tomo Bilateral  Result Date: 05/31/2017 CLINICAL DATA:  History of left breast cancer in 2013 status post lumpectomy and radiation therapy. History of benign right breast biopsy in 2014. EXAM: 2D DIGITAL DIAGNOSTIC BILATERAL MAMMOGRAM WITH CAD AND ADJUNCT TOMO COMPARISON:  Previous exam(s). ACR Breast Density Category b: There are scattered areas of fibroglandular density. FINDINGS: There are stable postsurgical changes within the left breast. Biopsy site marker within the right breast is stable in position. There are no new dominant masses, suspicious calcifications or secondary signs of malignancy within either breast. Mammographic images were processed with CAD. IMPRESSION: No evidence of malignancy within either breast. Stable postsurgical changes within the left breast. RECOMMENDATION: Bilateral diagnostic mammogram in 1 year. I have discussed the findings and recommendations with the patient. Results were also provided in writing at the conclusion of the visit. If applicable, a reminder letter will be sent to the patient regarding the next appointment. BI-RADS CATEGORY  2: Benign. Electronically Signed   By: Franki Cabot M.D.   On: 05/31/2017 10:04    Antibiotics:  Anti-infectives (From admission, onward)   Start     Dose/Rate Route Frequency Ordered Stop   06/07/17 2000  vancomycin (VANCOCIN) IVPB 1000 mg/200 mL premix     1,000 mg 200 mL/hr over 60 Minutes Intravenous  Once 06/07/17 1633 06/07/17 2320   06/07/17 1143  vancomycin (VANCOCIN) powder  Status:  Discontinued       As needed 06/07/17 1143 06/07/17 1213   06/07/17 0826  bacitracin 50,000 Units in sodium chloride irrigation 0.9 % 500 mL irrigation  Status:  Discontinued       As needed 06/07/17 0827 06/07/17 1213   06/07/17 0600  vancomycin (VANCOCIN) 1,500 mg in sodium chloride 0.9 % 500 mL IVPB     1,500 mg 250 mL/hr over 120 Minutes Intravenous To [facility] 06/06/17 0758 06/07/17 0833       Discharge Exam: Blood pressure 117/64, pulse 84, temperature 97.8 F (36.6 C), temperature source Oral, resp. rate 18, height 5\' 3"  (1.6 m), weight (!) 140.9 kg (310 lb 10.1 oz), SpO2 97 %. Neurologic: Grossly normal Dressing dry  Discharge Medications:   Allergies as of 06/12/2017      Reactions   Aleve [naproxen] Nausea Only   Compazine [prochlorperazine] Other (See Comments)   Numbness of face and  lips   Oxycodone Other (See Comments)   hallucinations   Penicillins Nausea Only, Other (See Comments)   Has patient had a PCN reaction causing immediate rash, facial/tongue/throat swelling, SOB or lightheadedness with hypotension: No Has patient had a PCN reaction causing severe rash involving mucus membranes or skin necrosis: No Has patient had a PCN reaction that required hospitalization No Has patient had a PCN reaction occurring within the last 10 years: No If all of the above answers are "NO", then may proceed with Cephalosporin use.      Medication List    STOP taking these medications   traMADol 50 MG tablet Commonly known as:  ULTRAM     TAKE  these medications   anastrozole 1 MG tablet Commonly known as:  ARIMIDEX Take 1 tablet (1 mg total) by mouth daily.   benzonatate 100 MG capsule Commonly known as:  TESSALON Take 100 mg by mouth 4 (four) times daily as needed for cough.   bumetanide 2 MG tablet Commonly known as:  BUMEX Take 2 mg by mouth daily.   clindamycin 300 MG capsule Commonly known as:  CLEOCIN Take 600 mg by mouth See admin instructions. Take 600 mg by mouth 1 hour prior to dental procedures   DEXILANT 30 MG capsule Generic drug:  Dexlansoprazole Take 30 mg by mouth daily.   diclofenac sodium 1 % Gel Commonly known as:  VOLTAREN Apply 2 g topically 4 (four) times daily. What changed:    when to take this  reasons to take this   docusate sodium 100 MG capsule Commonly known as:  COLACE Take 1 capsule (100 mg total) by mouth every 12  (twelve) hours. What changed:  when to take this   DULoxetine 60 MG capsule Commonly known as:  CYMBALTA Take 60 mg by mouth once daily   ergocalciferol 50000 units capsule Commonly known as:  VITAMIN D2 Take 1 capsule (50,000 Units total) by mouth once a week. What changed:  when to take this   HYDROcodone-acetaminophen 7.5-325 MG tablet Commonly known as:  NORCO Take 1-2 tablets by mouth every 6 (six) hours as needed for moderate pain ((score 4 to 6)).   INGREZZA 40 MG Caps Generic drug:  Valbenazine Tosylate Take 40 mg by mouth daily.   montelukast 10 MG tablet Commonly known as:  SINGULAIR Take 10 mg by mouth at bedtime.   MYRBETRIQ 50 MG Tb24 tablet Generic drug:  mirabegron ER Take 50 mg by mouth daily.   ondansetron 4 MG tablet Commonly known as:  ZOFRAN Take 4 mg by mouth every 8 (eight) hours as needed for nausea or vomiting.   potassium chloride SA 20 MEQ tablet Commonly known as:  K-DUR,KLOR-CON Take 20 mEq by mouth 2 (two) times daily.   pregabalin 100 MG capsule Commonly known as:  LYRICA Take 1 capsule (100 mg total) by mouth 3 (three) times daily.   sulfamethoxazole-trimethoprim 800-160 MG tablet Commonly known as:  BACTRIM DS,SEPTRA DS Take 1 tablet by mouth 2 (two) times daily.   tiZANidine 4 MG tablet Commonly known as:  ZANAFLEX Take 1 tablet (4 mg total) by mouth every 8 (eight) hours as needed for muscle spasms. What changed:    when to take this  reasons to take this            Durable Medical Equipment  (From admission, onward)        Start     Ordered   06/07/17 1554  DME Walker rolling  Once    Question:  Patient needs a walker to treat with the following condition  Answer:  S/P lumbar fusion   06/07/17 1553   06/07/17 1554  DME 3 n 1  Once     06/07/17 1553      Disposition: home   Final Dx: lumbar fusion  Discharge Instructions    Call MD for:  difficulty breathing, headache or visual disturbances   Complete by:   As directed    Call MD for:  persistant nausea and vomiting   Complete by:  As directed    Call MD for:  redness, tenderness, or signs of infection (pain, swelling, redness, odor or green/yellow discharge around incision  site)   Complete by:  As directed    Call MD for:  severe uncontrolled pain   Complete by:  As directed    Call MD for:  temperature >100.4   Complete by:  As directed    Diet - low sodium heart healthy   Complete by:  As directed    Increase activity slowly   Complete by:  As directed          Signed: Hidaya Daniel S 06/12/2017, 7:56 AM

## 2017-06-12 NOTE — Progress Notes (Signed)
Discharge information given. Meds from home given back to patient. Patient has all of belongings together and waiting for volunteer services to take her down by wheelchair.

## 2017-06-21 ENCOUNTER — Encounter: Payer: Medicare Other | Attending: Physical Medicine & Rehabilitation

## 2017-06-21 ENCOUNTER — Encounter: Payer: Self-pay | Admitting: Physical Medicine & Rehabilitation

## 2017-06-21 ENCOUNTER — Other Ambulatory Visit: Payer: Self-pay

## 2017-06-21 ENCOUNTER — Ambulatory Visit (HOSPITAL_BASED_OUTPATIENT_CLINIC_OR_DEPARTMENT_OTHER): Payer: Medicare Other | Admitting: Physical Medicine & Rehabilitation

## 2017-06-21 VITALS — BP 101/68 | HR 78

## 2017-06-21 DIAGNOSIS — G8929 Other chronic pain: Secondary | ICD-10-CM | POA: Insufficient documentation

## 2017-06-21 DIAGNOSIS — F329 Major depressive disorder, single episode, unspecified: Secondary | ICD-10-CM | POA: Insufficient documentation

## 2017-06-21 DIAGNOSIS — M48061 Spinal stenosis, lumbar region without neurogenic claudication: Secondary | ICD-10-CM | POA: Diagnosis not present

## 2017-06-21 DIAGNOSIS — Z9889 Other specified postprocedural states: Secondary | ICD-10-CM | POA: Diagnosis not present

## 2017-06-21 DIAGNOSIS — M797 Fibromyalgia: Secondary | ICD-10-CM | POA: Diagnosis not present

## 2017-06-21 DIAGNOSIS — Z853 Personal history of malignant neoplasm of breast: Secondary | ICD-10-CM | POA: Insufficient documentation

## 2017-06-21 DIAGNOSIS — M545 Low back pain, unspecified: Secondary | ICD-10-CM

## 2017-06-21 DIAGNOSIS — Z981 Arthrodesis status: Secondary | ICD-10-CM

## 2017-06-21 DIAGNOSIS — G4733 Obstructive sleep apnea (adult) (pediatric): Secondary | ICD-10-CM | POA: Diagnosis not present

## 2017-06-21 DIAGNOSIS — K219 Gastro-esophageal reflux disease without esophagitis: Secondary | ICD-10-CM | POA: Diagnosis not present

## 2017-06-21 DIAGNOSIS — G8918 Other acute postprocedural pain: Secondary | ICD-10-CM | POA: Insufficient documentation

## 2017-06-21 DIAGNOSIS — M48062 Spinal stenosis, lumbar region with neurogenic claudication: Secondary | ICD-10-CM

## 2017-06-21 MED ORDER — TIZANIDINE HCL 4 MG PO TABS: 4.0000 mg | ORAL_TABLET | Freq: Three times a day (TID) | ORAL | 0 refills | Status: DC | PRN

## 2017-06-21 MED ORDER — HYDROCODONE-ACETAMINOPHEN 7.5-325 MG PO TABS: 1.0000 | ORAL_TABLET | Freq: Four times a day (QID) | ORAL | 0 refills | Status: DC | PRN

## 2017-06-21 NOTE — Progress Notes (Signed)
Subjective:    Patient ID: Kelsey Wilson, female    DOB: 1961-01-27, 57 y.o.   MRN: [id number]  HPI  Interval hx L3-5  PSF 06/07/17, d/c 06/12/17, post op pain meds hydrocodone  Prescribed 1 wk supply by NS Dr Ronnald Ramp and 1 wk supply  Per NS PA Patient was instructed by Dr. Ronnald Ramp to call this office for additional postop pain medications Hydrocodone 7.5 for post op pain, taking it 4 times per day Has appt with Dr Ronnald Ramp on Monday for staple removal  Back feels a little better since POD 1 , pain is shifting to Left side  Pain Inventory Average Pain 8 Pain Right Now 8 My pain is constant and aching  In the last 24 hours, has pain interfered with the following? General activity 8 Relation with others 7 Enjoyment of life 8 What TIME of day is your pain at its worst? all Sleep (in general) Fair  Pain is worse with: walking, bending, sitting, inactivity, standing and some activites Pain improves with: rest Relief from Meds: 3  Mobility use a cane use a walker ability to climb steps?  no do you drive?  yes  Function disabled: date disabled . I need assistance with the following:  dressing, bathing, meal prep, household duties and shopping  Neuro/Psych bladder control problems weakness numbness tremor trouble walking spasms depression anxiety  Prior Studies Any changes since last visit?  no  Physicians involved in your care Any changes since last visit?  no   Family History  Problem Relation Age of Onset  . Hypertension Mother   . Diabetes Mother   . Hypertension Father   . Diabetes Father   . Cancer Paternal Grandmother        unknown  . Colon cancer Neg Hx    Social History   Socioeconomic History  . Marital status: Single    Spouse name: None  . Number of children: 3  . Years of education: None  . Highest education level: None  Social Needs  . Financial resource strain: None  . Food insecurity - worry: None  . Food insecurity - inability: None  .  Transportation needs - medical: None  . Transportation needs - non-medical: None  Occupational History  . None  Tobacco Use  . Smoking status: Never Smoker  . Smokeless tobacco: Never Used  Substance and Sexual Activity  . Alcohol use: No    Alcohol/week: 0.0 oz  . Drug use: No  . Sexual activity: No    Birth control/protection: Post-menopausal, Surgical  Other Topics Concern  . None  Social History Narrative  . None   Past Surgical History:  Procedure Laterality Date  . ABDOMINAL HYSTERECTOMY     still has ovaries  . APPENDECTOMY    . AXILLARY LYMPH NODE DISSECTION  11/28/2011   Procedure: AXILLARY LYMPH NODE DISSECTION;  Surgeon: Edward Jolly, MD;  Location: [facility];  Service: General;  Laterality: Left;  . BREAST LUMPECTOMY Left 11/28/2011   Malignant  . BREAST SURGERY Left 2013  . CARPAL TUNNEL RELEASE     Bilateral  . CESAREAN SECTION     pt. has had 3  . CHOLECYSTECTOMY    . COLONOSCOPY    . EYE SURGERY Bilateral    cataract removal  . KNEE SURGERY     Left Knee  . LAMINECTOMY WITH POSTERIOR LATERAL ARTHRODESIS LEVEL 2 N/A 06/07/2017   Procedure: Posterior Lateral Fusion Lumbar Three-Four and Transforaminal Interbody Fusion Lumbar Four-Five  with Segmental  Pedicle Screw Fixation;  Surgeon: Eustace Moore, MD;  Location: [facility];  Service: Neurosurgery;  Laterality: N/A;  Posterior Lateral Fusion Lumbar Three-Four and Transforaminal Interbody Fusion Lumbar Four-Five with Segmental  Pedicle Screw Fixation   . LUMBAR FUSION  06/07/2017   POST  . LUMBAR LAMINECTOMY/DECOMPRESSION MICRODISCECTOMY Bilateral 01/27/2016   Procedure: Laminectomy and Foraminotomy - Lumbar four -Lumbar five - bilateral- on-lay noninstrumented fusion;  Surgeon: Eustace Moore, MD;  Location: [place];  Service: Neurosurgery;  Laterality: Bilateral;  . MULTIPLE EXTRACTIONS WITH ALVEOLOPLASTY N/A 05/11/2016   Procedure: EXTRACTION OF TEETH EIGHTEEN, TWENTY AND TWENTY- NINE;  REMOVAL OF MANDIBULAR  TORUS AND EXOSTOSIS;  Surgeon: Diona Browner, DDS;  Location: [facility];  Service: Oral Surgery;  Laterality: N/A;  . PORTACATH PLACEMENT  06/18/2011   Procedure: INSERTION PORT-A-CATH;  Surgeon: Edward Jolly, MD;  Location: [place];  Service: General;  Laterality: Right;  right subclavian  . removal portacath  2014  . spur     Apex spur on both big toes  . TOE SURGERY Bilateral   . TONSILLECTOMY    . TOTAL KNEE ARTHROPLASTY Left 09/13/2014  . TOTAL KNEE ARTHROPLASTY Left 09/13/2014   Procedure: LEFT TOTAL KNEE ARTHROPLASTY;  Surgeon: Rod Can, MD;  Location: [facility];  Service: Orthopedics;  Laterality: Left;  . TOTAL KNEE ARTHROPLASTY Right 03/10/2015   Procedure: RIGHT TOTAL KNEE ARTHROPLASTY;  Surgeon: Rod Can, MD;  Location: [facility];  Service: Orthopedics;  Laterality: Right;   Past Medical History:  Diagnosis Date  . Anemia   . Anxiety   . Arthritis   . Breast cancer ([place]) 2010   T3N1 invasive ductal carcinoma left breast.Takes Arimidex daily  . Bursitis   . Carpal tunnel syndrome   . Chronic back pain    stenosis  . Constipation    takes Colace daily  . Depression    takes Benzotropine daily  . Diverticulitis of colon   . Dyspnea    daily when walking for over 1 yr.  . Fibromyalgia 08/2012  . GERD (gastroesophageal reflux disease)    takes Dexilant daily  . Hemorrhoid   . History of blood transfusion    no abnormal reaction noted  . History of colon polyps    benign  . History of shingles   . Joint pain   . Joint swelling   . Night muscle spasms    takes Flexeril nightly as needed  . Nocturia   . OSA (obstructive sleep apnea)   . OSA on CPAP   . Peripheral edema    takes Furosemide.Just started 01/18/16  . Peripheral neuropathy    takes Lyrica daily  . Personal history of chemotherapy    2013  . Personal history of radiation therapy    2013  . Pneumonia    hx of-2015  . Seasonal allergies    takes Singulair nightly  . SOB  (shortness of breath) on exertion    rarely with exertion  . 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 Alphonzo Grieve)   There were no vitals taken for this visit.  Opioid Risk Score:   Fall Risk Score:  `1  Depression screen PHQ 2/9  Depression screen [facility] 2/9 06/21/2017 03/07/2017 08/23/2015 04/26/2015 02/04/2015  Decreased Interest 1 2 2  0 0  Down, Depressed, Hopeless 1 2 0 0 0  PHQ - 2  Score 2 4 2  0 0  Altered sleeping - 0 1 - -  Tired, decreased energy - 3 3 - -  Change in appetite - 3 3 - -  Feeling bad or failure about yourself  - 2 0 - -  Trouble concentrating - 3 3 - -  Moving slowly or fidgety/restless - 3 2 - -  Suicidal thoughts - 0 0 - -  PHQ-9 Score - 18 14 - -  Difficult doing work/chores - Extremely dIfficult Very difficult - -  Some recent data might be hidden      Review of Systems  Constitutional: Positive for unexpected weight change.       Night sweats  HENT: Negative.   Eyes: Negative.   Respiratory: Positive for cough and shortness of breath.   Cardiovascular: Negative.   Gastrointestinal: Positive for nausea.  Endocrine: Negative.   Genitourinary: Negative.   Musculoskeletal: Negative.   Skin: Negative.   Allergic/Immunologic: Negative.   Neurological: Negative.   Hematological: Negative.   Psychiatric/Behavioral: Negative.        Objective:   Physical Exam  Constitutional: She is oriented to person, place, and time. She appears well-developed and well-nourished. No distress.  HENT:  Head: Normocephalic and atraumatic.  Eyes: Conjunctivae and EOM are normal. Pupils are equal, round, and reactive to light.  Neurological: She is alert and oriented to person, place, and time.  Skin: She is not diaphoretic.  Psychiatric: She has a normal mood and affect.  Nursing note and vitals reviewed.   General no acute distress mood and affect  appropriate Patient has normal strength bilateral deltoid, bicep, tricep, grip, hip flexor, knee extensor, ankle dorsiflexor and plantar flexor Gait is without evidence of toe drag or knee instability Patient uses a lumbar orthosis.       Assessment & Plan:  1.  History of lumbar stenosis status post L3 through L5 PLIF, has typical postoperative pain.  Has been on long-term narcotic analgesics with tramadol but now requires a higher opioid mill equivalent dose.  Will continue hydrocodone 7.5 mg 4 times daily Follow-up in 1 month with nurse practitioner or MD.  Plan to reduce to 5 mg 4 times daily at that time

## 2017-06-21 NOTE — Patient Instructions (Addendum)
Stop tramadol  Take hydrocodone 7.5mg  4 times a day, will reduce to 5mg  in one month Take Lyrica 100mg  three times a day Take tizanidine 4mg  3 times a day

## 2017-06-27 ENCOUNTER — Other Ambulatory Visit: Payer: Self-pay | Admitting: Adult Health

## 2017-06-27 MED FILL — ANASTROZOLE 1 MG TABLET: 1 | 30 days supply | Qty: 30 | Fill #3

## 2017-06-27 MED FILL — HYDROCODON-APAP 7.5-325: 7.5-325 | 30 days supply | Qty: 120 | Fill #0

## 2017-06-27 MED FILL — LYRICA 100 MG CAPSULE: 100 | 30 days supply | Qty: 90 | Fill #3

## 2017-06-27 NOTE — Telephone Encounter (Signed)
Will you please have patient come in for vitamin d check for her vitamin d deficiency?  Thanks,  Mendel Ryder

## 2017-07-01 ENCOUNTER — Other Ambulatory Visit: Payer: Self-pay | Admitting: *Deleted

## 2017-07-01 ENCOUNTER — Telehealth: Payer: Self-pay | Admitting: *Deleted

## 2017-07-01 DIAGNOSIS — E559 Vitamin D deficiency, unspecified: Secondary | ICD-10-CM

## 2017-07-01 NOTE — Telephone Encounter (Signed)
This RN spoke with pt  - verified she has completed current high dose vitamin d prescription - with need to recheck for further therapy with prescription strength vs OTC doses.  Appointment made for 07/02/2017.

## 2017-07-02 ENCOUNTER — Inpatient Hospital Stay: Payer: Medicare Other | Attending: Oncology

## 2017-07-02 ENCOUNTER — Ambulatory Visit: Payer: Medicare Other

## 2017-07-02 DIAGNOSIS — M8580 Other specified disorders of bone density and structure, unspecified site: Secondary | ICD-10-CM | POA: Diagnosis not present

## 2017-07-02 DIAGNOSIS — E559 Vitamin D deficiency, unspecified: Secondary | ICD-10-CM

## 2017-07-03 ENCOUNTER — Telehealth: Payer: Self-pay

## 2017-07-03 ENCOUNTER — Other Ambulatory Visit: Payer: Self-pay | Admitting: Adult Health

## 2017-07-03 DIAGNOSIS — C50412 Malignant neoplasm of upper-outer quadrant of left female breast: Secondary | ICD-10-CM

## 2017-07-03 DIAGNOSIS — Z170 Estrogen receptor positive status [ER+]: Secondary | ICD-10-CM

## 2017-07-03 LAB — VITAMIN D 25 HYDROXY (VIT D DEFICIENCY, FRACTURES): Vit D, 25-Hydroxy: 31.9 ng/mL (ref 30.0–100.0)

## 2017-07-03 MED FILL — VIT D2 1.25 MG (50,000 UNIT: 1.25 MG | 28 days supply | Qty: 4 | Fill #0

## 2017-07-03 NOTE — Telephone Encounter (Signed)
-----   Message from Gardenia Phlegm, NP sent at 07/03/2017  7:17 AM EST ----- Please renew patients meds x 3 months.  She will need a repeat Vitamin d level in April.    Thanks,   Mendel Ryder ----- Message ----- From: Buel Ream, [facility] [place] Sent: 07/03/2017   6:40 AM To: Gardenia Phlegm, NP

## 2017-07-03 NOTE — Telephone Encounter (Signed)
Spoke with patient to let her know that Vitamin D has been sent to [facility] and follow up vitamin d level will be checked at next lab appt in April.  Patient voiced understanding. No questions/concerns at this time.

## 2017-07-19 ENCOUNTER — Ambulatory Visit: Payer: Medicare Other | Admitting: Physical Medicine & Rehabilitation

## 2017-07-26 ENCOUNTER — Telehealth: Payer: Self-pay | Admitting: *Deleted

## 2017-07-26 MED ORDER — HYDROCODONE-ACETAMINOPHEN 5-325 MG PO TABS: 1.0000 | ORAL_TABLET | Freq: Four times a day (QID) | ORAL | 0 refills | Status: DC | PRN

## 2017-07-26 NOTE — Telephone Encounter (Signed)
Patient called about a refill on her hydrocodone. Next appointment is 08/12/2017.  Last clinic note (06/21/2017) indicates wean down to 5 mg QID from 7.5 mg.  Please refill electronically and/or advise on patient communication

## 2017-07-26 NOTE — Telephone Encounter (Signed)
Contacted patient.

## 2017-07-26 NOTE — Telephone Encounter (Signed)
Rx sent to [facility]

## 2017-07-29 MED FILL — VIT D2 1.25 MG (50,000 UNIT: 1.25 MG | 28 days supply | Qty: 4 | Fill #1

## 2017-07-29 MED FILL — LYRICA 100 MG CAPSULE: 100 | 30 days supply | Qty: 90 | Fill #4

## 2017-07-29 MED FILL — ANASTROZOLE 1 MG TABLET: 1 | 30 days supply | Qty: 30 | Fill #4

## 2017-08-12 ENCOUNTER — Ambulatory Visit (HOSPITAL_BASED_OUTPATIENT_CLINIC_OR_DEPARTMENT_OTHER): Payer: Medicare Other | Admitting: Physical Medicine & Rehabilitation

## 2017-08-12 ENCOUNTER — Encounter: Payer: Medicare Other | Attending: Physical Medicine & Rehabilitation

## 2017-08-12 ENCOUNTER — Encounter: Payer: Self-pay | Admitting: Physical Medicine & Rehabilitation

## 2017-08-12 VITALS — BP 138/92 | HR 72

## 2017-08-12 DIAGNOSIS — Z853 Personal history of malignant neoplasm of breast: Secondary | ICD-10-CM | POA: Diagnosis not present

## 2017-08-12 DIAGNOSIS — K219 Gastro-esophageal reflux disease without esophagitis: Secondary | ICD-10-CM | POA: Insufficient documentation

## 2017-08-12 DIAGNOSIS — M48061 Spinal stenosis, lumbar region without neurogenic claudication: Secondary | ICD-10-CM | POA: Insufficient documentation

## 2017-08-12 DIAGNOSIS — G894 Chronic pain syndrome: Secondary | ICD-10-CM

## 2017-08-12 DIAGNOSIS — G8918 Other acute postprocedural pain: Secondary | ICD-10-CM | POA: Diagnosis not present

## 2017-08-12 DIAGNOSIS — G8929 Other chronic pain: Secondary | ICD-10-CM | POA: Diagnosis not present

## 2017-08-12 DIAGNOSIS — Z5181 Encounter for therapeutic drug level monitoring: Secondary | ICD-10-CM | POA: Diagnosis not present

## 2017-08-12 DIAGNOSIS — G4733 Obstructive sleep apnea (adult) (pediatric): Secondary | ICD-10-CM | POA: Diagnosis not present

## 2017-08-12 DIAGNOSIS — Z79899 Other long term (current) drug therapy: Secondary | ICD-10-CM

## 2017-08-12 DIAGNOSIS — M545 Low back pain: Secondary | ICD-10-CM | POA: Insufficient documentation

## 2017-08-12 DIAGNOSIS — M48062 Spinal stenosis, lumbar region with neurogenic claudication: Secondary | ICD-10-CM | POA: Diagnosis not present

## 2017-08-12 DIAGNOSIS — Z9889 Other specified postprocedural states: Secondary | ICD-10-CM | POA: Diagnosis not present

## 2017-08-12 DIAGNOSIS — F329 Major depressive disorder, single episode, unspecified: Secondary | ICD-10-CM | POA: Insufficient documentation

## 2017-08-12 DIAGNOSIS — M797 Fibromyalgia: Secondary | ICD-10-CM | POA: Diagnosis not present

## 2017-08-12 DIAGNOSIS — Z981 Arthrodesis status: Secondary | ICD-10-CM

## 2017-08-12 MED ORDER — HYDROCODONE-ACETAMINOPHEN 5-325 MG PO TABS: 1.0000 | ORAL_TABLET | Freq: Four times a day (QID) | ORAL | 0 refills | Status: DC | PRN

## 2017-08-12 MED ORDER — METHOCARBAMOL 500 MG PO TABS: 500.0000 mg | ORAL_TABLET | Freq: Four times a day (QID) | ORAL | 0 refills | Status: DC

## 2017-08-12 MED ORDER — PREGABALIN 100 MG PO CAPS: 100.0000 mg | ORAL_CAPSULE | Freq: Three times a day (TID) | ORAL | 5 refills | Status: DC

## 2017-08-12 MED FILL — METHOCARBAMOL 500 MG TABS: 500 | 30 days supply | Qty: 120 | Fill #0

## 2017-08-12 NOTE — Patient Instructions (Signed)
We may need muscle relaxer only for 1 more month  Discus with Zella Ball next visit

## 2017-08-12 NOTE — Progress Notes (Signed)
Subjective:    Patient ID: Kelsey Wilson, female    DOB: Jan 24, 1961, 57 y.o.   MRN: [id number]  HPI L3-L5 fusion January 2019 Went down to Hydrocodone 5mg  last month, received 7.5mg  post op but this caused excessive itching. She complains of muscle spasms along her latissimus area radiating toward her breast. She does not have any lower extremity weakness or new bowel or bladder dysfunction. She is following up with her neurosurgeon this month. Pain Inventory Average Pain 8 Pain Right Now 10 My pain is aching  In the last 24 hours, has pain interfered with the following? General activity 2 Relation with others 3 Enjoyment of life 2 What TIME of day is your pain at its worst? daytime Sleep (in general) Poor  Pain is worse with: walking, bending and standing Pain improves with: therapy/exercise Relief from Meds: 3  Mobility use a cane ability to climb steps?  no do you drive?  yes  Function Do you have any goals in this area?  no  Neuro/Psych bladder control problems numbness tingling spasms depression anxiety  Prior Studies Any changes since last visit?  no  Physicians involved in your care Any changes since last visit?  no   Family History  Problem Relation Age of Onset  . Hypertension Mother   . Diabetes Mother   . Hypertension Father   . Diabetes Father   . Cancer Paternal Grandmother        unknown  . Colon cancer Neg Hx    Social History   Socioeconomic History  . Marital status: Single    Spouse name: Not on file  . Number of children: 3  . Years of education: Not on file  . Highest education level: Not on file  Social Needs  . Financial resource strain: Not on file  . Food insecurity - worry: Not on file  . Food insecurity - inability: Not on file  . Transportation needs - medical: Not on file  . Transportation needs - non-medical: Not on file  Occupational History  . Not on file  Tobacco Use  . Smoking status: Never Smoker  .  Smokeless tobacco: Never Used  Substance and Sexual Activity  . Alcohol use: No    Alcohol/week: 0.0 oz  . Drug use: No  . Sexual activity: No    Birth control/protection: Post-menopausal, Surgical  Other Topics Concern  . Not on file  Social History Narrative  . Not on file   Past Surgical History:  Procedure Laterality Date  . ABDOMINAL HYSTERECTOMY     still has ovaries  . APPENDECTOMY    . AXILLARY LYMPH NODE DISSECTION  11/28/2011   Procedure: AXILLARY LYMPH NODE DISSECTION;  Surgeon: Edward Jolly, MD;  Location: [place];  Service: General;  Laterality: Left;  . BREAST LUMPECTOMY Left 11/28/2011   Malignant  . BREAST SURGERY Left 2013  . CARPAL TUNNEL RELEASE     Bilateral  . CESAREAN SECTION     pt. has had 3  . CHOLECYSTECTOMY    . COLONOSCOPY    . EYE SURGERY Bilateral    cataract removal  . KNEE SURGERY     Left Knee  . LAMINECTOMY WITH POSTERIOR LATERAL ARTHRODESIS LEVEL 2 N/A 06/07/2017   Procedure: Posterior Lateral Fusion Lumbar Three-Four and Transforaminal Interbody Fusion Lumbar Four-Five with Segmental  Pedicle Screw Fixation;  Surgeon: Eustace Moore, MD;  Location: [place];  Service: Neurosurgery;  Laterality: N/A;  Posterior Lateral Fusion Lumbar Three-Four  and Transforaminal Interbody Fusion Lumbar Four-Five with Segmental  Pedicle Screw Fixation   . LUMBAR FUSION  06/07/2017   POST  . LUMBAR LAMINECTOMY/DECOMPRESSION MICRODISCECTOMY Bilateral 01/27/2016   Procedure: Laminectomy and Foraminotomy - Lumbar four -Lumbar five - bilateral- on-lay noninstrumented fusion;  Surgeon: Eustace Moore, MD;  Location: [place] [facility];  Service: Neurosurgery;  Laterality: Bilateral;  . MULTIPLE EXTRACTIONS WITH ALVEOLOPLASTY N/A 05/11/2016   Procedure: EXTRACTION OF TEETH EIGHTEEN, TWENTY AND TWENTY- NINE;  REMOVAL OF MANDIBULAR TORUS AND EXOSTOSIS;  Surgeon: Diona Browner, DDS;  Location: [place];  Service: Oral Surgery;  Laterality: N/A;  . PORTACATH PLACEMENT  06/18/2011    Procedure: INSERTION PORT-A-CATH;  Surgeon: Edward Jolly, MD;  Location: [place];  Service: General;  Laterality: Right;  right subclavian  . removal portacath  2014  . spur     Apex spur on both big toes  . TOE SURGERY Bilateral   . TONSILLECTOMY    . TOTAL KNEE ARTHROPLASTY Left 09/13/2014  . TOTAL KNEE ARTHROPLASTY Left 09/13/2014   Procedure: LEFT TOTAL KNEE ARTHROPLASTY;  Surgeon: Rod Can, MD;  Location: [place];  Service: Orthopedics;  Laterality: Left;  . TOTAL KNEE ARTHROPLASTY Right 03/10/2015   Procedure: RIGHT TOTAL KNEE ARTHROPLASTY;  Surgeon: Rod Can, MD;  Location: [facility];  Service: Orthopedics;  Laterality: Right;   Past Medical History:  Diagnosis Date  . Anemia   . Anxiety   . Arthritis   . Breast cancer ([place]) 2010   T3N1 invasive ductal carcinoma left breast.Takes Arimidex daily  . Bursitis   . Carpal tunnel syndrome   . Chronic back pain    stenosis  . Constipation    takes Colace daily  . Depression    takes Benzotropine daily  . Diverticulitis of colon   . Dyspnea    daily when walking for over 1 yr.  . Fibromyalgia 08/2012  . GERD (gastroesophageal reflux disease)    takes Dexilant daily  . Hemorrhoid   . History of blood transfusion    no abnormal reaction noted  . History of colon polyps    benign  . History of shingles   . Joint pain   . Joint swelling   . Night muscle spasms    takes Flexeril nightly as needed  . Nocturia   . OSA (obstructive sleep apnea)   . OSA on CPAP   . Peripheral edema    takes Furosemide.Just started 01/18/16  . Peripheral neuropathy    takes Lyrica daily  . Personal history of chemotherapy    2013  . Personal history of radiation therapy    2013  . Pneumonia    hx of-2015  . Seasonal allergies    takes Singulair nightly  . SOB (shortness of breath) on exertion    rarely with exertion  . Splenorenal shunt malfunction (HCC)    stable splenorenal shunt with 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 Alphonzo Grieve)   There were no vitals taken for this visit.  Opioid Risk Score:   Fall Risk Score:  `1  Depression screen PHQ 2/9  Depression screen [facility] 2/9 06/21/2017 03/07/2017 08/23/2015 04/26/2015 02/04/2015  Decreased Interest 1 2 2  0 0  Down, Depressed, Hopeless 1 2 0 0 0  PHQ - 2 Score 2 4 2  0 0  Altered sleeping - 0 1 - -  Tired, decreased energy - 3 3 - -  Change in appetite -  3 3 - -  Feeling bad or failure about yourself  - 2 0 - -  Trouble concentrating - 3 3 - -  Moving slowly or fidgety/restless - 3 2 - -  Suicidal thoughts - 0 0 - -  PHQ-9 Score - 18 14 - -  Difficult doing work/chores - Extremely dIfficult Very difficult - -  Some recent data might be hidden     Review of Systems  Constitutional: Positive for diaphoresis and unexpected weight change.  HENT: Negative.   Eyes: Negative.   Respiratory: Positive for apnea and cough.   Cardiovascular: Negative.   Gastrointestinal: Negative.   Endocrine: Negative.   Genitourinary: Negative.   Musculoskeletal: Positive for arthralgias, back pain, gait problem and myalgias.  Skin: Negative.   Allergic/Immunologic: Negative.   Hematological: Negative.   Psychiatric/Behavioral: Negative.   All other systems reviewed and are negative.      Objective:   Physical Exam  Constitutional: She is oriented to person, place, and time. She appears well-developed and well-nourished. No distress.  HENT:  Head: Normocephalic and atraumatic.  Eyes: Conjunctivae and EOM are normal. Pupils are equal, round, and reactive to light.  Neck: Normal range of motion.  Musculoskeletal:       Lumbar back: She exhibits decreased range of motion and tenderness. She exhibits no deformity.  Well-healed lumbar incision.  Tenderness palpation in the paraspinals around this area.  Neurological: She is alert and oriented to person, place, and time.  Skin: Skin is warm and dry. She  is not diaphoretic.  Nursing note and vitals reviewed.   Gait is antalgic no evidence of foot drag or knee instability      Assessment & Plan:  #1.  Lumbar postlaminectomy syndrome following L3 through L5 lumbar decompressive laminectomy and fusion for lumbar stenosis  We will continue hydrocodone 5 mg 4 times daily  Indication for chronic opioid: lumbar spinal stenosis Medication and dose: Hydrocodone 5mg  QID # pills per month: 120 Last UDS date: 03/07/17, repeat 08/12/17 Pain contract signed (Y/N): Y Date narcotic database last reviewed (include red flags): 08/12/17

## 2017-08-17 LAB — TOXASSURE SELECT,+ANTIDEPR,UR

## 2017-08-20 ENCOUNTER — Telehealth: Payer: Self-pay | Admitting: *Deleted

## 2017-08-20 NOTE — Telephone Encounter (Signed)
Urine drug screen for this encounter is consistent for prescribed medication 

## 2017-08-27 ENCOUNTER — Telehealth: Payer: Self-pay | Admitting: *Deleted

## 2017-08-27 NOTE — Telephone Encounter (Signed)
Kelsey Wilson left message stating she needed a refill on her hydrocodone.  She has appt 09/09/17 with Zella Ball. There was a refill on 08/12/17 that it looks like she has not filled yet per PMP Aware.  I will notify her it should be at the pharmacy. I spoke with her and she went to [place] went to [facility] and she will check there,

## 2017-08-28 ENCOUNTER — Other Ambulatory Visit: Payer: Self-pay | Admitting: Physical Medicine & Rehabilitation

## 2017-08-28 MED FILL — HYDROCODON-APAP 5-325: 5-325 | 30 days supply | Qty: 120 | Fill #0

## 2017-08-28 MED FILL — LYRICA 100 MG CAPSULE: 100 | 30 days supply | Qty: 90 | Fill #0

## 2017-08-28 MED FILL — ANASTROZOLE 1 MG TABLET: 1 | 30 days supply | Qty: 30 | Fill #5

## 2017-09-05 ENCOUNTER — Other Ambulatory Visit: Payer: Self-pay | Admitting: *Deleted

## 2017-09-05 ENCOUNTER — Ambulatory Visit: Payer: Medicare Other | Admitting: Registered Nurse

## 2017-09-05 ENCOUNTER — Inpatient Hospital Stay: Payer: Medicare Other

## 2017-09-05 ENCOUNTER — Inpatient Hospital Stay: Payer: Medicare Other | Attending: Oncology | Admitting: Oncology

## 2017-09-05 ENCOUNTER — Telehealth: Payer: Self-pay | Admitting: Adult Health

## 2017-09-05 VITALS — BP 137/95 | HR 80 | Temp 98.6°F | Resp 18 | Ht 63.0 in | Wt 267.4 lb

## 2017-09-05 DIAGNOSIS — Z170 Estrogen receptor positive status [ER+]: Secondary | ICD-10-CM | POA: Diagnosis not present

## 2017-09-05 DIAGNOSIS — C50412 Malignant neoplasm of upper-outer quadrant of left female breast: Secondary | ICD-10-CM | POA: Diagnosis present

## 2017-09-05 DIAGNOSIS — I890 Lymphedema, not elsewhere classified: Secondary | ICD-10-CM

## 2017-09-05 DIAGNOSIS — N951 Menopausal and female climacteric states: Secondary | ICD-10-CM

## 2017-09-05 DIAGNOSIS — R0789 Other chest pain: Secondary | ICD-10-CM

## 2017-09-05 DIAGNOSIS — M8580 Other specified disorders of bone density and structure, unspecified site: Secondary | ICD-10-CM

## 2017-09-05 DIAGNOSIS — Z6841 Body Mass Index (BMI) 40.0 and over, adult: Secondary | ICD-10-CM

## 2017-09-05 LAB — CBC WITH DIFFERENTIAL (CANCER CENTER ONLY)
Basophils Absolute: 0 10*3/uL (ref 0.0–0.1)
Basophils Relative: 0 %
Eosinophils Absolute: 0.1 10*3/uL (ref 0.0–0.5)
Eosinophils Relative: 4 %
HCT: 37.7 % (ref 34.8–46.6)
Hemoglobin: 12.4 g/dL (ref 11.6–15.9)
Lymphocytes Relative: 49 %
Lymphs Abs: 1.8 10*3/uL (ref 0.9–3.3)
MCH: 27.9 pg (ref 25.1–34.0)
MCHC: 32.9 g/dL (ref 31.5–36.0)
MCV: 84.9 fL (ref 79.5–101.0)
Monocytes Absolute: 0.3 10*3/uL (ref 0.1–0.9)
Monocytes Relative: 9 %
Neutro Abs: 1.4 10*3/uL — ABNORMAL LOW (ref 1.5–6.5)
Neutrophils Relative %: 38 %
Platelet Count: 211 10*3/uL (ref 145–400)
RBC: 4.44 MIL/uL (ref 3.70–5.45)
RDW: 13.8 % (ref 11.2–14.5)
WBC Count: 3.6 10*3/uL — ABNORMAL LOW (ref 3.9–10.3)

## 2017-09-05 LAB — CMP (CANCER CENTER ONLY)
ALT: 18 U/L (ref 0–55)
AST: 22 U/L (ref 5–34)
Albumin: 3.8 g/dL (ref 3.5–5.0)
Alkaline Phosphatase: 107 U/L (ref 40–150)
Anion gap: 11 (ref 3–11)
BUN: 13 mg/dL (ref 7–26)
CO2: 30 mmol/L — ABNORMAL HIGH (ref 22–29)
Calcium: 9.8 mg/dL (ref 8.4–10.4)
Chloride: 100 mmol/L (ref 98–109)
Creatinine: 0.96 mg/dL (ref 0.60–1.10)
GFR, Est AFR Am: >60 mL/min (ref >60)
GFR, Estimated: >60 mL/min (ref >60)
Glucose, Bld: 150 mg/dL — ABNORMAL HIGH (ref 70–140)
Potassium: 3.6 mmol/L (ref 3.5–5.1)
Sodium: 141 mmol/L (ref 136–145)
Total Bilirubin: 0.7 mg/dL (ref 0.2–1.2)
Total Protein: 7.5 g/dL (ref 6.4–8.3)

## 2017-09-05 NOTE — Progress Notes (Signed)
[facility]  [phone] Fax:([phone]) [phone]  OFFICE PROGRESS NOTE   ID: YURANI FETTES   DOB: 11-Oct-1960  MR#: [id number]  TIW#:[id number]  Patient Care Team: Javier Docker, MD as PCP - General (Internal Medicine) Willia Craze, NP as Nurse Practitioner (Gastroenterology) Gardiner Barefoot, DPM as Consulting Physician (Podiatry) Melida Quitter, MD as Consulting Physician (Otolaryngology) Delice Bison, Charlestine Massed, NP as Nurse Practitioner (Hematology and Oncology) Eppie Gibson, MD as Attending Physician (Radiation Oncology) Magrinat, Virgie Dad, MD as Consulting Physician (Oncology) Excell Seltzer, MD as Consulting Physician (General Surgery) OTHER MD: Earnie Larsson,  Theodis Sato, MD, Alysia Penna mD  CHIEF COMPLAINT: left breast cancer  CURRENT THERAPY: anastrozole   BREAST CANCER HISTORY: From Dr. Julien Girt note 06/13/2011:   "The patient first noticed a lump in the upper outer left breast about mid November. She had been having regular mammograms. She consulted her physician and was referred to the breast center for further workup. Bilateral mammogram was performed which revealed an irregular mass in the upper outer quadrant of the left breast at the 2:00 position measuring approximately 3 cm. A clearly enlarged left axillary lymph node was also seen. 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 Ki-67 was 100%. Subsequent bilateral breast MRI was performed. This reveals abnormal enhancement in the area of the mass measuring 2.9 x 5.2 cm. Again noted was an approximately 3 cm axillary lymph node. No other areas were detected."   Her subsequent history is as detailed below.  INTERVAL HISTORY: Thelia is here today for follow up of her left breast cancer.  She continues on anastrozole, which she tolerates  relativly well. However, she does experience moderate hot flashes, denies vaginal dryness.  On 05/31/2017, she underwent a diagnostic bilateral breast mammogram with tomography, breast density category B, resulting in no evidence of malignancy.   She will be due for repeat DEXA scan June 02, 2018, at the time of her next mammogram.    REVIEW OF SYSTEMS: Katiya reports constant left breast, left axilla and left shoulder blade pain. She also has been experiencing increased abdominal pain. She is being followed by Dr. Alphonzo Grieve for this issue.  She has not been exercising and notes she has been very fatigued. She underwent back surgery in January 2019. She has been going to physical therapy ever since, but did not attend last week due to her abdominal pain. She denies unusual headaches, visual changes, nausea, vomiting, or dizziness. There has been no unusual cough, phlegm production, or pleurisy. This been no change in bowel or bladder habits. She denies unexplained weight loss, bleeding, rash, or fever. A detailed review of systems was otherwise noncontributory.    PAST MEDICAL HISTORY: Past Medical History:  Diagnosis Date  . Anemia   . Anxiety   . Arthritis   . Breast cancer ([facility]) 2010   T3N1 invasive ductal carcinoma left breast.Takes Arimidex daily  . Bursitis   . Carpal tunnel syndrome   . Chronic back pain    stenosis  . Constipation    takes Colace daily  . Depression    takes Benzotropine daily  . Diverticulitis of colon   . Dyspnea    daily when walking for over 1 yr.  . Fibromyalgia 08/2012  . GERD (gastroesophageal reflux disease)    takes Dexilant daily  . Hemorrhoid   . History of blood transfusion  no abnormal reaction noted  . History of colon polyps    benign  . History of shingles   . Joint pain   . Joint swelling   . Night muscle spasms    takes Flexeril nightly as needed  . Nocturia   . OSA (obstructive sleep apnea)   . OSA on CPAP   . Peripheral  edema    takes Furosemide.Just started 01/18/16  . Peripheral neuropathy    takes Lyrica daily  . Personal history of chemotherapy    2013  . Personal history of radiation therapy    2013  . Pneumonia    hx of-2015  . Seasonal allergies    takes Singulair nightly  . SOB (shortness of breath) on exertion    rarely with exertion  . 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 Alphonzo Grieve)    PAST SURGICAL HISTORY: Past Surgical History:  Procedure Laterality Date  . ABDOMINAL HYSTERECTOMY     still has ovaries  . APPENDECTOMY    . AXILLARY LYMPH NODE DISSECTION  11/28/2011   Procedure: AXILLARY LYMPH NODE DISSECTION;  Surgeon: Edward Jolly, MD;  Location: [facility];  Service: General;  Laterality: Left;  . BREAST LUMPECTOMY Left 11/28/2011   Malignant  . BREAST SURGERY Left 2013  . CARPAL TUNNEL RELEASE     Bilateral  . CESAREAN SECTION     pt. has had 3  . CHOLECYSTECTOMY    . COLONOSCOPY    . EYE SURGERY Bilateral    cataract removal  . KNEE SURGERY     Left Knee  . LAMINECTOMY WITH POSTERIOR LATERAL ARTHRODESIS LEVEL 2 N/A 06/07/2017   Procedure: Posterior Lateral Fusion Lumbar Three-Four and Transforaminal Interbody Fusion Lumbar Four-Five with Segmental  Pedicle Screw Fixation;  Surgeon: Eustace Moore, MD;  Location: [facility];  Service: Neurosurgery;  Laterality: N/A;  Posterior Lateral Fusion Lumbar Three-Four and Transforaminal Interbody Fusion Lumbar Four-Five with Segmental  Pedicle Screw Fixation   . LUMBAR FUSION  06/07/2017   POST  . LUMBAR LAMINECTOMY/DECOMPRESSION MICRODISCECTOMY Bilateral 01/27/2016   Procedure: Laminectomy and Foraminotomy - Lumbar four -Lumbar five - bilateral- on-lay noninstrumented fusion;  Surgeon: Eustace Moore, MD;  Location: [facility];  Service: Neurosurgery;  Laterality: Bilateral;  . MULTIPLE EXTRACTIONS WITH ALVEOLOPLASTY N/A  05/11/2016   Procedure: EXTRACTION OF TEETH EIGHTEEN, TWENTY AND TWENTY- NINE;  REMOVAL OF MANDIBULAR TORUS AND EXOSTOSIS;  Surgeon: Diona Browner, DDS;  Location: [facility];  Service: Oral Surgery;  Laterality: N/A;  . PORTACATH PLACEMENT  06/18/2011   Procedure: INSERTION PORT-A-CATH;  Surgeon: Edward Jolly, MD;  Location: [facility];  Service: General;  Laterality: Right;  right subclavian  . removal portacath  2014  . spur     Apex spur on both big toes  . TOE SURGERY Bilateral   . TONSILLECTOMY    . TOTAL KNEE ARTHROPLASTY Left 09/13/2014  . TOTAL KNEE ARTHROPLASTY Left 09/13/2014   Procedure: LEFT TOTAL KNEE ARTHROPLASTY;  Surgeon: Rod Can, MD;  Location: [facility];  Service: Orthopedics;  Laterality: Left;  . TOTAL KNEE ARTHROPLASTY Right 03/10/2015   Procedure: RIGHT TOTAL KNEE ARTHROPLASTY;  Surgeon: Rod Can, MD;  Location: [facility];  Service: Orthopedics;  Laterality: Right;    FAMILY HISTORY Family History  Problem Relation Age of Onset  . Hypertension Mother   . Diabetes Mother   . Hypertension  Father   . Diabetes Father   . Cancer Paternal Grandmother        unknown  . Colon cancer Neg Hx   Her father, 62 years old is the minister at [employer] she attends. Her mother is 33. The patient has 3 brothers and 3 sisters. There is no history of breast or ovarian cancer in the immediate family.  GYNECOLOGIC HISTORY: Menarche age 11, first live birth age 1, she is [place] P3. She underwent hysterectomy without salpingo-oophorectomy in 2000. She never took hormone replacement.  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 She is divorced. At home she lives with herr daughter Ree Edman and her 6 children. Her son Laranda Burkemper works as a Administrator. The patient has 5 additional grandchildren. She attends a [employer].    ADVANCED DIRECTIVES: Not in place  HEALTH MAINTENANCE: Social  History   Tobacco Use  . Smoking status: Never Smoker  . Smokeless tobacco: Never Used  Substance Use Topics  . Alcohol use: No    Alcohol/week: 0.0 oz  . Drug use: No     Colonoscopy: Not on file  PAP: Scheduled for January 2016  Bone density: Bone density December 2015 was normal.  Lipid panel: Not on file  Allergies  Allergen Reactions  . Aleve [Naproxen] Nausea Only  . Compazine [Prochlorperazine] Other (See Comments)    Numbness of face and  lips  . Oxycodone Other (See Comments)    hallucinations  . Penicillins Nausea Only and Other (See Comments)    Has patient had a PCN reaction causing immediate rash, facial/tongue/throat swelling, SOB or lightheadedness with hypotension: No Has patient had a PCN reaction causing severe rash involving mucus membranes or skin necrosis: No Has patient had a PCN reaction that required hospitalization No Has patient had a PCN reaction occurring within the last 10 years: No If all of the above answers are "NO", then may proceed with Cephalosporin use.    Current Outpatient Medications  Medication Sig Dispense Refill  . anastrozole (ARIMIDEX) 1 MG tablet Take 1 tablet (1 mg total) by mouth daily. 30 tablet 5  . benzonatate (TESSALON) 100 MG capsule Take 100 mg by mouth 4 (four) times daily as needed for cough.     . bumetanide (BUMEX) 2 MG tablet Take 2 mg by mouth daily.     . clindamycin (CLEOCIN) 300 MG capsule Take 600 mg by mouth See admin instructions. Take 600 mg by mouth 1 hour prior to dental procedures  0  . Dexlansoprazole (DEXILANT) 30 MG capsule Take 30 mg by mouth daily.    . diclofenac sodium (VOLTAREN) 1 % GEL Apply 2 g topically 4 (four) times daily. (Patient taking differently: Apply 2 g topically 4 (four) times daily as needed (for pain). ) 3 Tube 1  . docusate sodium (COLACE) 100 MG capsule Take 1 capsule (100 mg total) by mouth every 12 (twelve) hours. (Patient taking differently: Take 100 mg by mouth daily. ) 60 capsule  0  . DULoxetine (CYMBALTA) 60 MG capsule Take 60 mg by mouth once daily    . HYDROcodone-acetaminophen (NORCO/VICODIN) 5-325 MG tablet Take 1 tablet by mouth every 6 (six) hours as needed for moderate pain or severe pain. 120 tablet 0  . methocarbamol (ROBAXIN) 500 MG tablet Take 1 tablet (500 mg total) by mouth 4 (four) times daily. 120 tablet 0  . mirabegron ER (MYRBETRIQ) 50 MG TB24 tablet Take  50 mg by mouth daily.    . montelukast (SINGULAIR) 10 MG tablet Take 10 mg by mouth at bedtime.    . ondansetron (ZOFRAN) 4 MG tablet Take 4 mg by mouth every 8 (eight) hours as needed for nausea or vomiting.    . potassium chloride SA (K-DUR,KLOR-CON) 20 MEQ tablet Take 20 mEq by mouth 2 (two) times daily.     . pregabalin (LYRICA) 100 MG capsule Take 1 capsule (100 mg total) by mouth 3 (three) times daily. 90 capsule 5  . sulfamethoxazole-trimethoprim (BACTRIM DS,SEPTRA DS) 800-160 MG tablet Take 1 tablet by mouth 2 (two) times daily.    [place] Tosylate (INGREZZA) 40 MG CAPS Take 40 mg by mouth daily.    . Vitamin D, Ergocalciferol, (DRISDOL) 50000 units CAPS capsule TAKE 1 CAPSULE BY MOUTH ONCE A WEEK. FOR A TOTAL OF 12 WEEKS OF THERAPY 4 capsule 3   No current facility-administered medications for this visit.     OBJECTIVE: Middle-aged African-American woman who appears older than stated age  70:   09/05/17 1125  BP: (!) 137/95  Pulse: 80  Resp: 18  Temp: 98.6 F (37 C)  SpO2: 100%     Body mass index is 47.37 kg/m.      ECOG FS: 2 - Symptomatic, <50% confined to bed  Sclerae unicteric, EOMs intact No cervical or supraclavicular adenopathy Lungs no rales or rhonchi Heart regular rate and rhythm Abd soft, nontender, positive bowel sounds MSK no focal spinal tenderness, no upper extremity lymphedema Neuro: nonfocal, well oriented, appropriate affect Breasts: The right breast is unremarkable.  The left breast is status post lumpectomy and radiation.  There is no evidence of  local recurrence.  I also do not palpate any abnormality in the left axilla or left scapular area.   LAB RESULTS: Lab Results  Component Value Date   WBC 3.6 (L) 09/05/2017   NEUTROABS 1.4 (L) 09/05/2017   HGB 8.4 (L) 06/09/2017   HCT 37.7 09/05/2017   MCV 84.9 09/05/2017   PLT 211 09/05/2017      Chemistry      Component Value Date/Time   NA 137 05/29/2017 1039   NA 144 03/07/2017 1327   K 3.5 05/29/2017 1039   K 3.3 (L) 03/07/2017 1327   CL 98 (L) 05/29/2017 1039   CL 101 09/18/2012 0853   CO2 29 05/29/2017 1039   CO2 32 (H) 03/07/2017 1327   BUN 13 05/29/2017 1039   BUN 16.0 03/07/2017 1327   CREATININE 0.90 05/29/2017 1039   CREATININE 1.0 03/07/2017 1327      Component Value Date/Time   CALCIUM 9.7 05/29/2017 1039   CALCIUM 10.0 03/07/2017 1327   ALKPHOS 92 03/07/2017 1327   AST 21 03/07/2017 1327   ALT 17 03/07/2017 1327   BILITOT 0.80 03/07/2017 1327      Lab Results  Component Value Date   LABCA2 41 (H) 06/13/2011    Urinalysis    Component Value Date/Time   COLORURINE YELLOW 09/02/2014 Alpha 09/02/2014 1044   LABSPEC 1.012 09/02/2014 1044   PHURINE 6.0 09/02/2014 1044   GLUCOSEU NEGATIVE 09/02/2014 1044   HGBUR NEGATIVE 09/02/2014 1044   BILIRUBINUR NEGATIVE 09/02/2014 1044   KETONESUR NEGATIVE 09/02/2014 1044   PROTEINUR NEGATIVE 09/02/2014 1044   UROBILINOGEN 0.2 09/02/2014 1044   NITRITE NEGATIVE 09/02/2014 1044   LEUKOCYTESUR NEGATIVE 09/02/2014 1044    STUDIES: On 05/31/2017, she underwent a diagnostic bilateral breast mammogram with tomography, breast  density category B, resulting in no evidence of malignancy.  ASSESSMENT: 57 y.o. [place], [place] woman:  (1) Status post left breast biopsy 06/01/2011 for a cT2 pN1, stage IIB invasive ductal carcinoma, grade 3, estrogen receptor 80% and progesterone receptor 9% positive, with no HER-2 amplification and an MIB-1 of 100%  (2) Treated neoadjuvantly with 4  cycles of cyclophosphamide, epirubicin and fluorouracil, followed by 4 cycles of docetaxel, completed 11/01/2011  (3) Status post left lumpectomy and axillary lymph node resection 11/28/2011, showing a complete pathologic response (no residual invasive or in situ cancer in the breast and 0 of 17 lymph nodes involved)  (4) Adjuvant radiation therapy completed 03/31/2012  (5) Started anastrozole November 2013;  (a) osteopenia on bone density 05/2016 with t score of -1.3 in the right femur  (6) chronic Left upper extremity lymphedema  (7) fibromyalgia and osteoarthritis with polyarthralgia  PLAN:   Tonilynn will soon be 6 years out from definitive surgery for her breast cancer with no evidence of disease recurrence.  This is very favorable.  She continues on anastrozole, now 5-1/2 years into it.  She tolerates it well.  Because she had advanced disease, I think it would be preferable if she completed a total of 7 years of anastrozole.  That will take her through the end of 2020.  I have set her up for a bone density with her mammogram late this year.  I think the pain and discomfort and spasms she is having in the left chest wall area is going to be postoperative and secondary to damage to the nerves from radiation, but were going to obtain a chest CT scan just to make sure.  I will call her with those results.  I am also referring her to physical therapy.  I believe some stretching and exercises and strengthening of her left upper extremity will be helpful.  I also encouraged her to participate in our yoga and tai chi classes  She will see [place] again in January 2020 and then she will see me one last time January 2021  She knows to call for any other issues that may develop before the next visit.   Trishelle Devora, Virgie Dad, MD  09/05/17 11:42 AM Medical Oncology and Hematology [facility] [address] [place], [place] [address] Tel. [phone]    Fax. [phone]  This  document serves as a record of services personally performed by Chauncey Cruel, MD. It was created on his behalf by Margit Banda, a trained medical scribe. The creation of this record is based on the scribe's personal observations and the provider's statements to them.   I have reviewed the above documentation for accuracy and completeness, and I agree with the above.

## 2017-09-05 NOTE — Telephone Encounter (Signed)
Gave avs and calendar ° °

## 2017-09-09 ENCOUNTER — Encounter: Payer: Self-pay | Admitting: Registered Nurse

## 2017-09-09 ENCOUNTER — Encounter: Payer: Medicare Other | Attending: Physical Medicine & Rehabilitation | Admitting: Registered Nurse

## 2017-09-09 VITALS — BP 137/94 | HR 77 | Resp 14 | Ht 63.0 in | Wt 268.0 lb

## 2017-09-09 DIAGNOSIS — M47817 Spondylosis without myelopathy or radiculopathy, lumbosacral region: Secondary | ICD-10-CM

## 2017-09-09 DIAGNOSIS — K219 Gastro-esophageal reflux disease without esophagitis: Secondary | ICD-10-CM | POA: Insufficient documentation

## 2017-09-09 DIAGNOSIS — F329 Major depressive disorder, single episode, unspecified: Secondary | ICD-10-CM | POA: Insufficient documentation

## 2017-09-09 DIAGNOSIS — M5416 Radiculopathy, lumbar region: Secondary | ICD-10-CM | POA: Diagnosis not present

## 2017-09-09 DIAGNOSIS — M48061 Spinal stenosis, lumbar region without neurogenic claudication: Secondary | ICD-10-CM | POA: Diagnosis not present

## 2017-09-09 DIAGNOSIS — M62838 Other muscle spasm: Secondary | ICD-10-CM

## 2017-09-09 DIAGNOSIS — G8918 Other acute postprocedural pain: Secondary | ICD-10-CM | POA: Insufficient documentation

## 2017-09-09 DIAGNOSIS — Z853 Personal history of malignant neoplasm of breast: Secondary | ICD-10-CM | POA: Diagnosis not present

## 2017-09-09 DIAGNOSIS — G8929 Other chronic pain: Secondary | ICD-10-CM | POA: Insufficient documentation

## 2017-09-09 DIAGNOSIS — M545 Low back pain: Secondary | ICD-10-CM | POA: Insufficient documentation

## 2017-09-09 DIAGNOSIS — Z79899 Other long term (current) drug therapy: Secondary | ICD-10-CM

## 2017-09-09 DIAGNOSIS — M7061 Trochanteric bursitis, right hip: Secondary | ICD-10-CM | POA: Diagnosis not present

## 2017-09-09 DIAGNOSIS — G894 Chronic pain syndrome: Secondary | ICD-10-CM

## 2017-09-09 DIAGNOSIS — G4733 Obstructive sleep apnea (adult) (pediatric): Secondary | ICD-10-CM | POA: Insufficient documentation

## 2017-09-09 DIAGNOSIS — Z981 Arthrodesis status: Secondary | ICD-10-CM

## 2017-09-09 DIAGNOSIS — Z9889 Other specified postprocedural states: Secondary | ICD-10-CM | POA: Insufficient documentation

## 2017-09-09 DIAGNOSIS — Z5181 Encounter for therapeutic drug level monitoring: Secondary | ICD-10-CM | POA: Diagnosis not present

## 2017-09-09 DIAGNOSIS — M797 Fibromyalgia: Secondary | ICD-10-CM | POA: Diagnosis not present

## 2017-09-09 DIAGNOSIS — M7062 Trochanteric bursitis, left hip: Secondary | ICD-10-CM

## 2017-09-09 MED ORDER — METHYLPREDNISOLONE 4 MG PO TBPK: ORAL_TABLET | ORAL | 0 refills | Status: DC

## 2017-09-09 MED FILL — METHYLPREDNISOLONE 4 MG TAB: 4 | 6 days supply | Qty: 21 | Fill #0

## 2017-09-09 NOTE — Progress Notes (Signed)
Subjective:    Patient ID: Kelsey Wilson, female    DOB: February 16, 1961, 57 y.o.   MRN: [id number]  HPI: [name] is a 57 year old female who returns for follow up appointment for chronic pain and medication refill. She states her pain is located in her lower back radiating into her buttocks and left hip. She rates her pain 8. Her current exercise regime is walking.   [name] Morphine Equivalent is 18.00 MME. UDS was performed on 08/12/2017, it was consistent.    Pain Inventory Average Pain 8 Pain Right Now 8 My pain is aching  In the last 24 hours, has pain interfered with the following? General activity 7 Relation with others 7 Enjoyment of life 7 What TIME of day is your pain at its worst? all Sleep (in general) Poor  Pain is worse with: walking, bending, sitting and standing Pain improves with: heat/ice Relief from Meds: 2  Mobility walk with assistance use a cane  Function I need assistance with the following:  meal prep, household duties and shopping  Neuro/Psych weakness numbness tingling trouble walking spasms depression anxiety  Prior Studies Any changes since last visit?  no  Physicians involved in your care Any changes since last visit?  no   Family History  Problem Relation Age of Onset  . Hypertension Mother   . Diabetes Mother   . Hypertension Father   . Diabetes Father   . Cancer Paternal Grandmother        unknown  . Colon cancer Neg Hx    Social History   Socioeconomic History  . Marital status: Single    Spouse name: Not on file  . Number of children: 3  . Years of education: Not on file  . Highest education level: Not on file  Occupational History  . Not on file  Social Needs  . Financial resource strain: Not on file  . Food insecurity:    Worry: Not on file    Inability: Not on file  . Transportation needs:    Medical: Not on file    Non-medical: Not on file  Tobacco Use  . Smoking status: Never Smoker    . Smokeless tobacco: Never Used  Substance and Sexual Activity  . Alcohol use: No    Alcohol/week: 0.0 oz  . Drug use: No  . Sexual activity: Never    Birth control/protection: Post-menopausal, Surgical  Lifestyle  . Physical activity:    Days per week: Not on file    Minutes per session: Not on file  . Stress: Not on file  Relationships  . Social connections:    Talks on phone: Not on file    Gets together: Not on file    Attends religious service: Not on file    Active member of club or organization: Not on file    Attends meetings of clubs or organizations: Not on file    Relationship status: Not on file  Other Topics Concern  . Not on file  Social History Narrative  . Not on file   Past Surgical History:  Procedure Laterality Date  . ABDOMINAL HYSTERECTOMY     still has ovaries  . APPENDECTOMY    . AXILLARY LYMPH NODE DISSECTION  11/28/2011   Procedure: AXILLARY LYMPH NODE DISSECTION;  Surgeon: Edward Jolly, MD;  Location: [facility];  Service: General;  Laterality: Left;  . BREAST LUMPECTOMY Left 11/28/2011   Malignant  . BREAST SURGERY Left 2013  .  CARPAL TUNNEL RELEASE     Bilateral  . CESAREAN SECTION     pt. has had 3  . CHOLECYSTECTOMY    . COLONOSCOPY    . EYE SURGERY Bilateral    cataract removal  . KNEE SURGERY     Left Knee  . LAMINECTOMY WITH POSTERIOR LATERAL ARTHRODESIS LEVEL 2 N/A 06/07/2017   Procedure: Posterior Lateral Fusion Lumbar Three-Four and Transforaminal Interbody Fusion Lumbar Four-Five with Segmental  Pedicle Screw Fixation;  Surgeon: Eustace Moore, MD;  Location: [place] [facility];  Service: Neurosurgery;  Laterality: N/A;  Posterior Lateral Fusion Lumbar Three-Four and Transforaminal Interbody Fusion Lumbar Four-Five with Segmental  Pedicle Screw Fixation   . LUMBAR FUSION  06/07/2017   POST  . LUMBAR LAMINECTOMY/DECOMPRESSION MICRODISCECTOMY Bilateral 01/27/2016   Procedure: Laminectomy and Foraminotomy - Lumbar four -Lumbar five - bilateral-  on-lay noninstrumented fusion;  Surgeon: Eustace Moore, MD;  Location: [facility];  Service: Neurosurgery;  Laterality: Bilateral;  . MULTIPLE EXTRACTIONS WITH ALVEOLOPLASTY N/A 05/11/2016   Procedure: EXTRACTION OF TEETH EIGHTEEN, TWENTY AND TWENTY- NINE;  REMOVAL OF MANDIBULAR TORUS AND EXOSTOSIS;  Surgeon: Diona Browner, DDS;  Location: [facility];  Service: Oral Surgery;  Laterality: N/A;  . PORTACATH PLACEMENT  06/18/2011   Procedure: INSERTION PORT-A-CATH;  Surgeon: Edward Jolly, MD;  Location: [place];  Service: General;  Laterality: Right;  right subclavian  . removal portacath  2014  . spur     Apex spur on both big toes  . TOE SURGERY Bilateral   . TONSILLECTOMY    . TOTAL KNEE ARTHROPLASTY Left 09/13/2014  . TOTAL KNEE ARTHROPLASTY Left 09/13/2014   Procedure: LEFT TOTAL KNEE ARTHROPLASTY;  Surgeon: Rod Can, MD;  Location: [facility];  Service: Orthopedics;  Laterality: Left;  . TOTAL KNEE ARTHROPLASTY Right 03/10/2015   Procedure: RIGHT TOTAL KNEE ARTHROPLASTY;  Surgeon: Rod Can, MD;  Location: [facility];  Service: Orthopedics;  Laterality: Right;   Past Medical History:  Diagnosis Date  . Anemia   . Anxiety   . Arthritis   . Breast cancer ([place]) 2010   T3N1 invasive ductal carcinoma left breast.Takes Arimidex daily  . Bursitis   . Carpal tunnel syndrome   . Chronic back pain    stenosis  . Constipation    takes Colace daily  . Depression    takes Benzotropine daily  . Diverticulitis of colon   . Dyspnea    daily when walking for over 1 yr.  . Fibromyalgia 08/2012  . GERD (gastroesophageal reflux disease)    takes Dexilant daily  . Hemorrhoid   . History of blood transfusion    no abnormal reaction noted  . History of colon polyps    benign  . History of shingles   . Joint pain   . Joint swelling   . Night muscle spasms    takes Flexeril nightly as needed  . Nocturia   . OSA (obstructive sleep apnea)   . OSA on CPAP   . Peripheral  edema    takes Furosemide.Just started 01/18/16  . Peripheral neuropathy    takes Lyrica daily  . Personal history of chemotherapy    2013  . Personal history of radiation therapy    2013  . Pneumonia    hx of-2015  . Seasonal allergies    takes Singulair nightly  . SOB (shortness of breath) on exertion    rarely with exertion  . 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 Alphonzo Grieve)   BP (!) 137/94 (BP Location: Right Wrist, Patient Position: Sitting, Cuff Size: Normal)   Pulse 77   Resp 14   Ht 5\' 3"  (1.6 m)   Wt 268 lb (121.6 kg)   SpO2 98%   BMI 47.47 kg/m   Opioid Risk Score:   Fall Risk Score:  `1  Depression screen PHQ 2/9  Depression screen [facility] 2/9 06/21/2017 03/07/2017 08/23/2015 04/26/2015 02/04/2015  Decreased Interest 1 2 2  0 0  Down, Depressed, Hopeless 1 2 0 0 0  PHQ - 2 Score 2 4 2  0 0  Altered sleeping - 0 1 - -  Tired, decreased energy - 3 3 - -  Change in appetite - 3 3 - -  Feeling bad or failure about yourself  - 2 0 - -  Trouble concentrating - 3 3 - -  Moving slowly or fidgety/restless - 3 2 - -  Suicidal thoughts - 0 0 - -  PHQ-9 Score - 18 14 - -  Difficult doing work/chores - Extremely dIfficult Very difficult - -  Some recent data might be hidden    Review of Systems  Constitutional: Positive for diaphoresis and unexpected weight change.  Eyes: Negative.   Cardiovascular: Positive for leg swelling.  Gastrointestinal: Positive for abdominal pain, nausea and vomiting.  Endocrine: Negative.   Musculoskeletal: Positive for arthralgias, back pain, gait problem and myalgias.       Spoasms  Allergic/Immunologic: Negative.   Neurological: Positive for weakness and numbness.       Tingling  Psychiatric/Behavioral: Positive for dysphoric mood. The patient is nervous/anxious.   All other systems reviewed and are negative.        Objective:   Physical Exam  Constitutional: She is oriented to person, place, and time. She appears well-developed and well-nourished.  HENT:  Head: Normocephalic and atraumatic.  Neck: Normal range of motion. Neck supple.  Cardiovascular: Normal rate and regular rhythm.  Pulmonary/Chest: Effort normal and breath sounds normal.  Musculoskeletal:  Normal Muscle Bulk and Muscle Testing Reveals: Upper Extremities: Decreased ROM 90 Degrees and Muscle Strength 4/5 Bilateral AC Joint Tenderness Thoracic Paraspinal Tenderness: T-7-T-9 Lumbar Hypersensitivity Bilateral Greater Trochanter Tenderness Lower Extremities: Right: Decreased ROM and Muscle Strength 4/5 Right Lower Extremity Flexion Produces Pain into her Popliteal Fossa Left: Full ROM and Muscle Strength 5/5 Arises from Table with ease Narrow Based gait   Neurological: She is alert and oriented to person, place, and time.  Skin: Skin is warm and dry.  Psychiatric: She has a normal mood and affect.  Nursing note and vitals reviewed.         Assessment & Plan:  1. Lumbar Postlaminectomy/ S/P Lumbar Fusion: Continue HEP as Tolerated and Continue current medication regime. Hydrocodone 5/325 mg one tablet every 6 hours as needed. No script given today, will e-scribe on 09/23/2017. She verbalizes understanding.  2. Lumbar Radiculitis: Continue current medication regime with Lyrica.  3. Fibromyalgia: Continue Current Medication Regimen with Lyrica 4. Bilateral Greater Trochanter Bursitis: Continue to Alternate Ice and Heat Therapy.  5. Muscle Spasm: Continue current medication regimen with Robaxin. Will evaluate next month.  6. Chronic Pain Syndrome: Continue Current medication regimen and HEP as Tolerated. Continue to Monitor  20 minutes of Face to Face Patient Care time was spent during this visit. All questions encouraged and answered,   F/U in 1 month

## 2017-09-10 ENCOUNTER — Other Ambulatory Visit: Payer: Self-pay | Admitting: Oncology

## 2017-09-10 DIAGNOSIS — Z1231 Encounter for screening mammogram for malignant neoplasm of breast: Secondary | ICD-10-CM

## 2017-09-13 ENCOUNTER — Encounter (HOSPITAL_COMMUNITY): Payer: Self-pay

## 2017-09-13 ENCOUNTER — Ambulatory Visit (HOSPITAL_COMMUNITY)
Admission: RE | Admit: 2017-09-13 | Discharge: 2017-09-13 | Disposition: A | Discharge status: Home / Self Care | Payer: Medicare Other | Source: Ambulatory Visit | Attending: Oncology | Admitting: Oncology

## 2017-09-13 DIAGNOSIS — C50412 Malignant neoplasm of upper-outer quadrant of left female breast: Secondary | ICD-10-CM | POA: Insufficient documentation

## 2017-09-13 DIAGNOSIS — R911 Solitary pulmonary nodule: Secondary | ICD-10-CM | POA: Insufficient documentation

## 2017-09-13 DIAGNOSIS — Z170 Estrogen receptor positive status [ER+]: Secondary | ICD-10-CM

## 2017-09-13 IMAGING — CT CT CHEST W/ CM
2 of 3 series · 15 of 36 positions shown, 18 images · IV contrast (omnipaque)
Comparison: PET-CT on 06/25/2011

CLINICAL DATA: Left-sided chest wall pain. Personal history of left
breast carcinoma.

EXAM:
CT CHEST WITH CONTRAST
TECHNIQUE: Multidetector CT imaging of the chest was performed during
intravenous contrast administration.
CONTRAST:  75mL OMNIPAQUE IOHEXOL 300 MG/ML  SOLN

[Series 2: axial st · axial · 0.74mm/px · z∈[-109,+95]mm · 12 of 120 slices shown, 15 images]
[im 9/120  mediastinal]
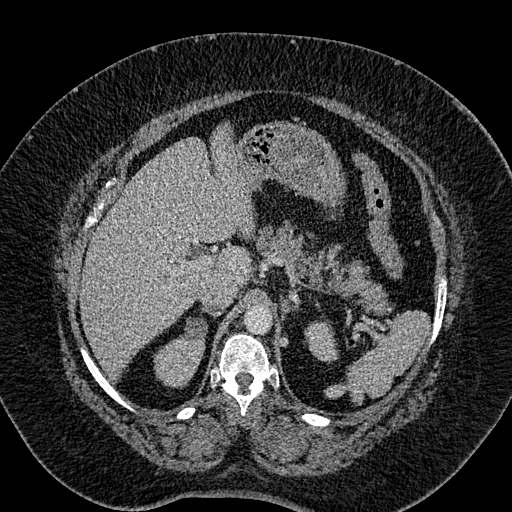
[im 9/120  lung]
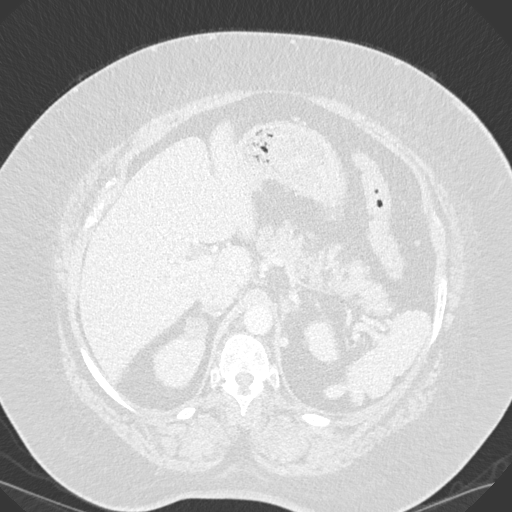
[im 18/120  lung]
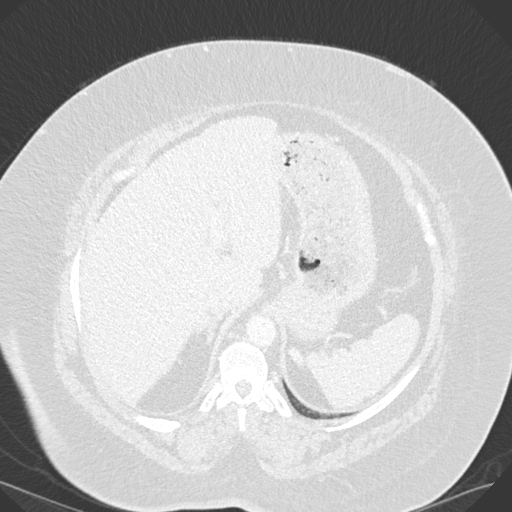
[im 27/120  lung]
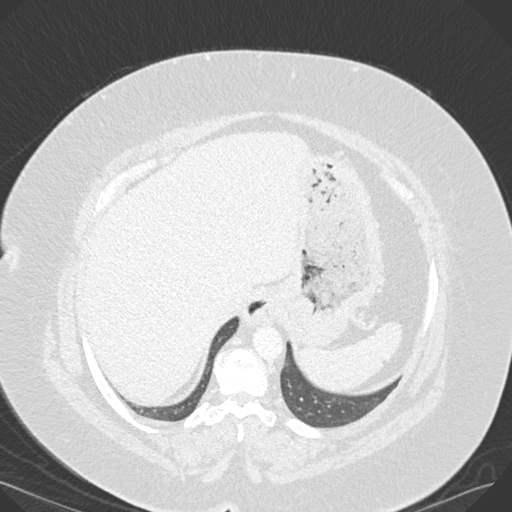
[im 36/120  lung]
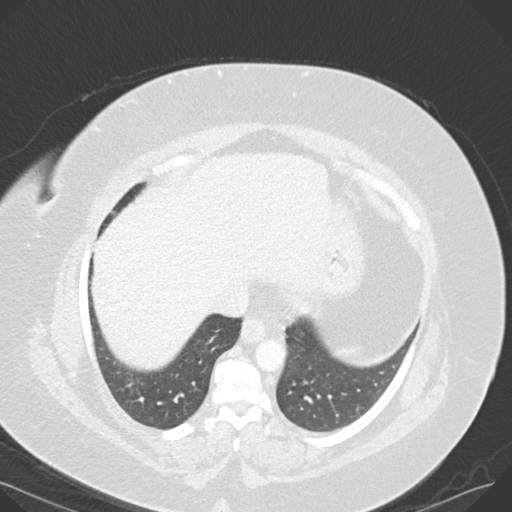
[im 45/120  mediastinal]
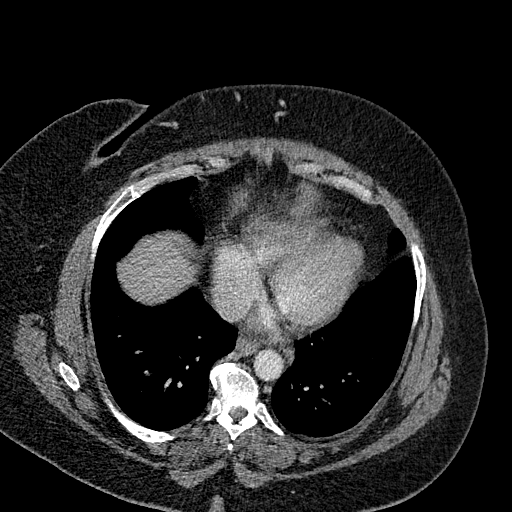
[im 45/120  lung]
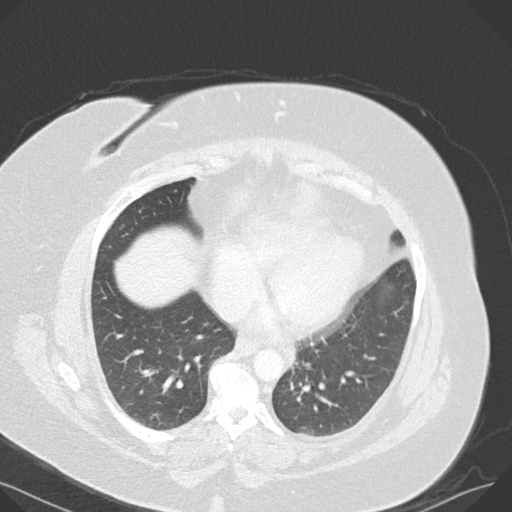
[im 53/120  lung]
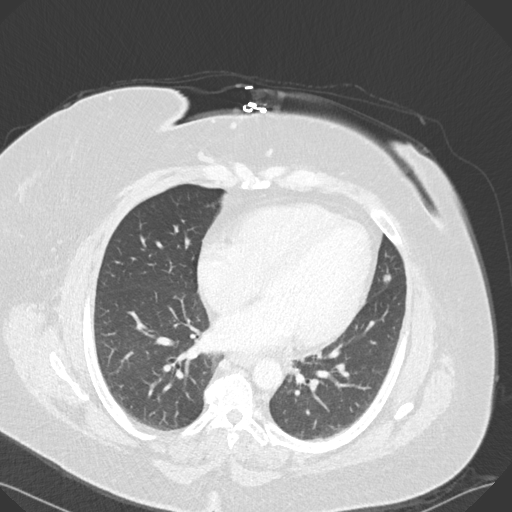
[im 67/120  lung]
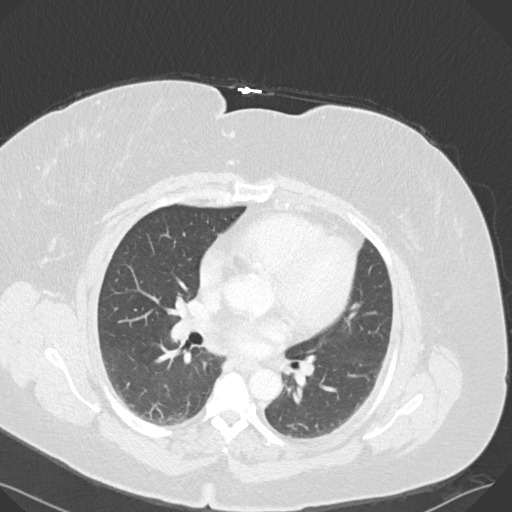
[im 75/120  lung]
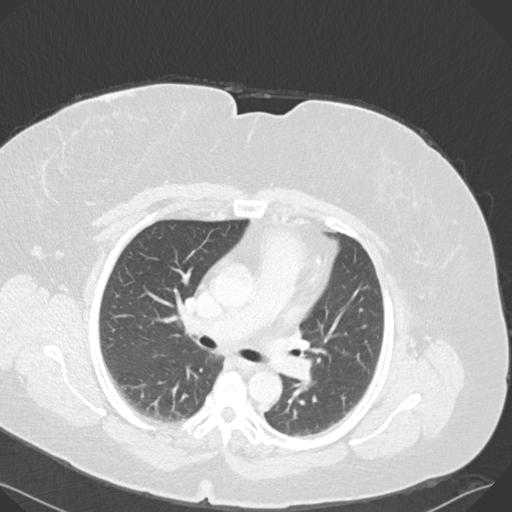
[im 84/120  mediastinal]
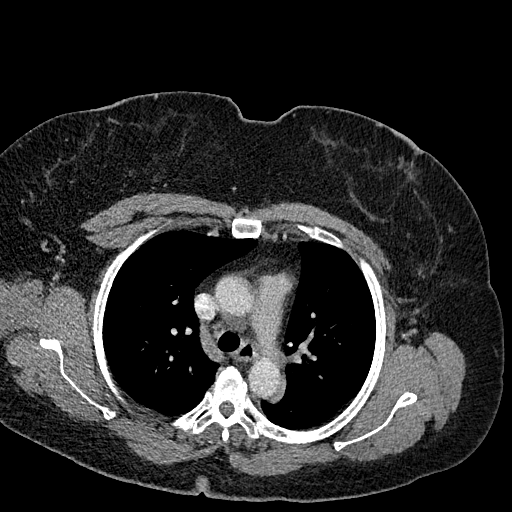
[im 84/120  lung]
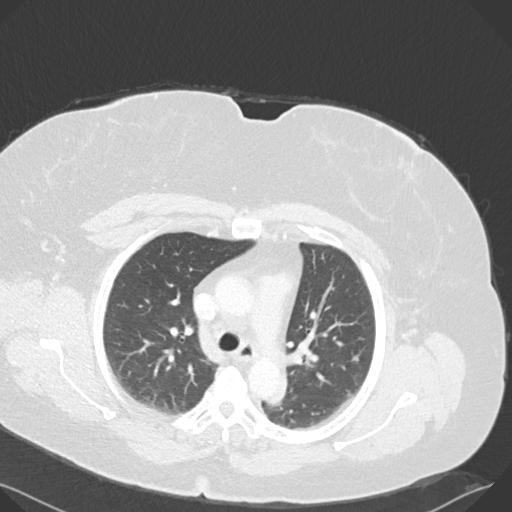
[im 93/120  lung]
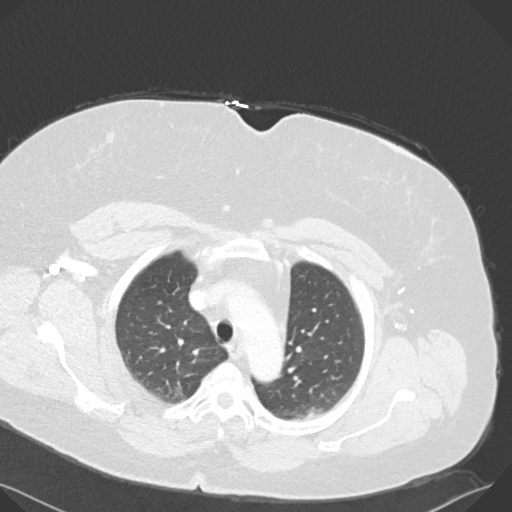
[im 102/120  lung]
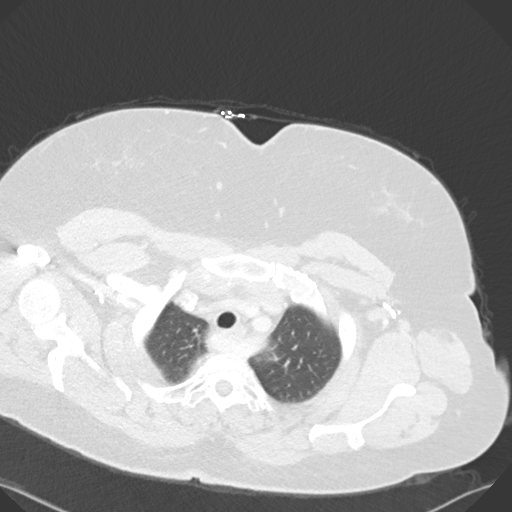
[im 111/120  lung]
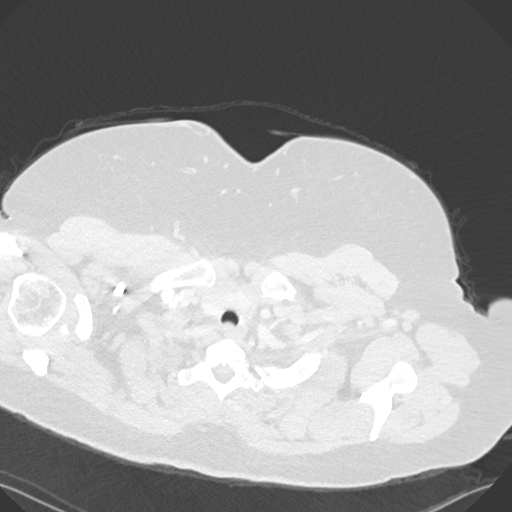

[Series 6: coronal · coronal · 0.49mm/px · 3 of 153 slices shown]
[im 31/153  lung]
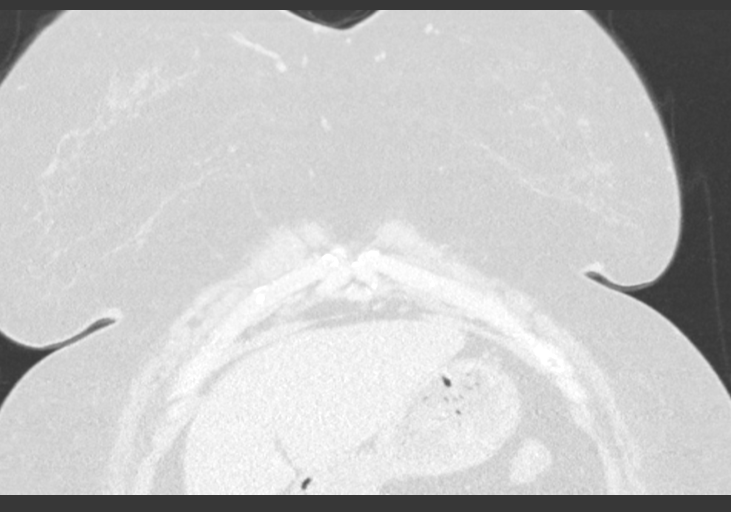
[im 61/153  lung]
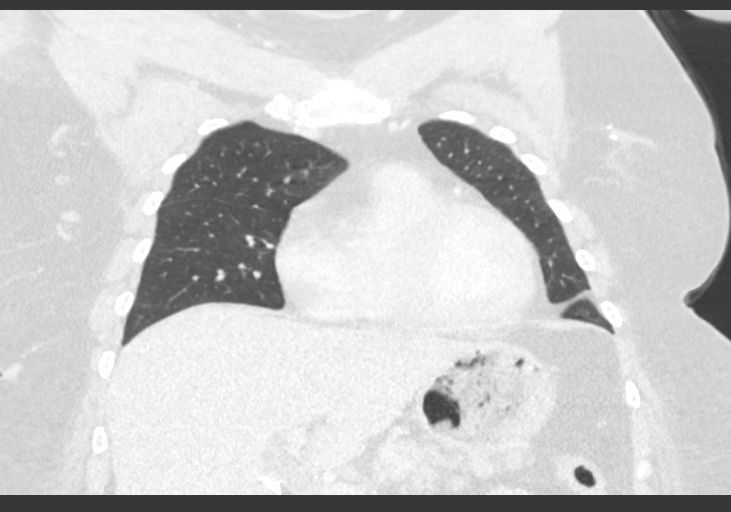
[im 92/153  lung]
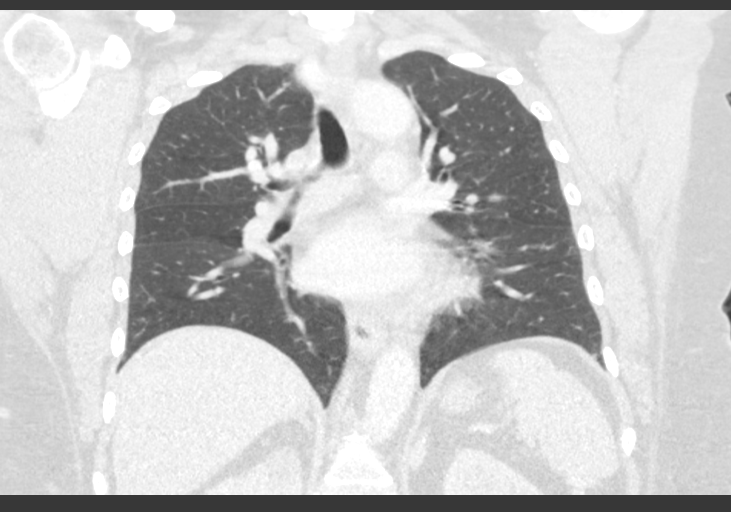

[15 of 36 positions shown; findings below may reference images not displayed]

FINDINGS: Cardiovascular: No acute findings. Aberrant origin of right
subclavian artery again noted.

Mediastinum/Nodes: No masses or pathologically enlarged lymph nodes
identified. Surgical clips in left axilla, without evidence of
axillary lymphadenopathy.

Lungs/Pleura: 8 mm noncalcified pulmonary nodule is seen in the
inferior lingula on image 67/5, which is new since previous study. A
tiny 3 mm pulmonary nodule is also seen in the posterior left lower
lobe on image 63/5, not definitely seen on prior study. No evidence
of pulmonary infiltrate or pleural effusion.

Upper Abdomen:  Unremarkable.

Musculoskeletal: No suspicious bone lesions. No evidence of chest
wall mass for inflammatory process.
IMPRESSION: New 8 mm pulmonary nodule in inferior lingula, and 3 mm pulmonary
nodule in left lower lobe. Consider PET-CT for further evaluation,
or followup by chest CT in 3 months.

No evidence of axillary lymphadenopathy or chest wall mass.

## 2017-09-13 MED ORDER — IOHEXOL 300 MG/ML  SOLN
75.0000 mL | Freq: Once | INTRAMUSCULAR | Status: AC | PRN
  Administered 2017-09-13: 75 mL via INTRAVENOUS

## 2017-09-15 ENCOUNTER — Other Ambulatory Visit: Payer: Self-pay | Admitting: Oncology

## 2017-09-15 DIAGNOSIS — R911 Solitary pulmonary nodule: Secondary | ICD-10-CM | POA: Insufficient documentation

## 2017-09-15 DIAGNOSIS — C50412 Malignant neoplasm of upper-outer quadrant of left female breast: Secondary | ICD-10-CM

## 2017-09-15 DIAGNOSIS — Z170 Estrogen receptor positive status [ER+]: Secondary | ICD-10-CM

## 2017-09-15 NOTE — Progress Notes (Signed)
I called Kelsey Wilson to give her the results of the CT scan.  The good news is that I do not see any evidence of cancer to explain the discomfort she is having.  That is likely going to be due to scar tissue.  She does have 2 very small nodules in the lung, 1 0.8 cm, the other one 0.3.  We are going to repeat a CT of the chest in 6 months to evaluate those further and she will see me shortly after that  She is in agreement with this plan

## 2017-09-16 ENCOUNTER — Telehealth: Payer: Self-pay | Admitting: *Deleted

## 2017-09-16 DIAGNOSIS — C50412 Malignant neoplasm of upper-outer quadrant of left female breast: Secondary | ICD-10-CM

## 2017-09-16 DIAGNOSIS — R911 Solitary pulmonary nodule: Secondary | ICD-10-CM

## 2017-09-16 DIAGNOSIS — Z170 Estrogen receptor positive status [ER+]: Secondary | ICD-10-CM

## 2017-09-16 DIAGNOSIS — R9389 Abnormal findings on diagnostic imaging of other specified body structures: Secondary | ICD-10-CM

## 2017-09-16 NOTE — Telephone Encounter (Signed)
This RN received VM from pt stating she "received call from Dr Jana Hakim yesterday with results of CT showing 2 spots on my lung " " we talked about it and he said we could follow up in 6 months "  " after the call - I got concerned and would like to check before then ".  Return call given as [phone]- call returned with no answer , noted per demographic page number is [phone]- this number called with VM stating mail box was full and could not receive further VM's.  Home number dialed with recording stating " this number is no longer in service ".  Of note this RN reviewed pt's concerns with MD- he recommended follow up in 6 months due to small size of nodules.  Reading states " 3 months or PET evaluation "  MD recommendation for best evaluation is can proceed to a CT in 3 months.  This RN attempted to reach the patient again - and was able to speak with Sharyn Lull.  Discussed tumor sizes and MD's recommendation and reasoning.  Kendel appreciated conversation and would like to proceed to follow up in 3 months.

## 2017-09-18 ENCOUNTER — Ambulatory Visit: Payer: Medicare Other | Attending: Oncology | Admitting: Physical Therapy

## 2017-09-18 ENCOUNTER — Other Ambulatory Visit: Payer: Self-pay

## 2017-09-18 DIAGNOSIS — M25612 Stiffness of left shoulder, not elsewhere classified: Secondary | ICD-10-CM | POA: Diagnosis present

## 2017-09-18 DIAGNOSIS — R293 Abnormal posture: Secondary | ICD-10-CM | POA: Insufficient documentation

## 2017-09-18 DIAGNOSIS — M25512 Pain in left shoulder: Secondary | ICD-10-CM | POA: Insufficient documentation

## 2017-09-18 DIAGNOSIS — G8929 Other chronic pain: Secondary | ICD-10-CM | POA: Diagnosis present

## 2017-09-18 DIAGNOSIS — M25611 Stiffness of right shoulder, not elsewhere classified: Secondary | ICD-10-CM

## 2017-09-18 NOTE — Therapy (Signed)
[place], [place], [address] Phone: [phone]   Fax:  [phone]  Physical Therapy Evaluation  Patient Details  Name: Kelsey Wilson MRN: [id number] Date of Birth: September 14, 1960 Referring Provider: Dr. Lurline Del   Encounter Date: 09/18/2017  PT End of Session - 09/18/17 1131    Visit Number  1    Number of Visits  9    Date for PT Re-Evaluation  11/01/17    PT Start Time  1020    PT Stop Time  1102    PT Time Calculation (min)  42 min    Activity Tolerance  Patient tolerated treatment well    Behavior During Therapy  [facility] for tasks assessed/performed       Past Medical History:  Diagnosis Date  . Anemia   . Anxiety   . Arthritis   . Breast cancer ([place]) 2010   T3N1 invasive ductal carcinoma left breast.Takes Arimidex daily  . Bursitis   . Carpal tunnel syndrome   . Chronic back pain    stenosis  . Constipation    takes Colace daily  . Depression    takes Benzotropine daily  . Diverticulitis of colon   . Dyspnea    daily when walking for over 1 yr.  . Fibromyalgia 08/2012  . GERD (gastroesophageal reflux disease)    takes Dexilant daily  . Hemorrhoid   . History of blood transfusion    no abnormal reaction noted  . History of colon polyps    benign  . History of shingles   . Joint pain   . Joint swelling   . Night muscle spasms    takes Flexeril nightly as needed  . Nocturia   . OSA (obstructive sleep apnea)   . OSA on CPAP   . Peripheral edema    takes Furosemide.Just started 01/18/16  . Peripheral neuropathy    takes Lyrica daily  . Personal history of chemotherapy    2013  . Personal history of radiation therapy    2013  . Pneumonia    hx of-2015  . Seasonal allergies    takes Singulair nightly  . SOB (shortness of breath) on exertion    rarely with exertion  . 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 Alphonzo Grieve)    Past Surgical History:  Procedure Laterality Date  . ABDOMINAL HYSTERECTOMY     still has ovaries  . APPENDECTOMY    . AXILLARY LYMPH NODE DISSECTION  11/28/2011   Procedure: AXILLARY LYMPH NODE DISSECTION;  Surgeon: Edward Jolly, MD;  Location: [place];  Service: General;  Laterality: Left;  . BREAST LUMPECTOMY Left 11/28/2011   Malignant  . BREAST SURGERY Left 2013  . CARPAL TUNNEL RELEASE     Bilateral  . CESAREAN SECTION     pt. has had 3  . CHOLECYSTECTOMY    . COLONOSCOPY    . EYE SURGERY Bilateral    cataract removal  . KNEE SURGERY     Left Knee  . LAMINECTOMY WITH POSTERIOR LATERAL ARTHRODESIS LEVEL 2 N/A 06/07/2017   Procedure: Posterior Lateral Fusion Lumbar Three-Four and Transforaminal Interbody Fusion Lumbar Four-Five with Segmental  Pedicle Screw Fixation;  Surgeon: Eustace Moore, MD;  Location: [place];  Service: Neurosurgery;  Laterality: N/A;  Posterior Lateral Fusion Lumbar Three-Four and Transforaminal Interbody Fusion Lumbar Four-Five with Segmental  Pedicle Screw Fixation   . LUMBAR FUSION  06/07/2017   POST  . LUMBAR LAMINECTOMY/DECOMPRESSION MICRODISCECTOMY Bilateral 01/27/2016   Procedure: Laminectomy and Foraminotomy - Lumbar four -Lumbar five - bilateral- on-lay noninstrumented fusion;  Surgeon: Eustace Moore, MD;  Location: [place] [facility];  Service: Neurosurgery;  Laterality: Bilateral;  . MULTIPLE EXTRACTIONS WITH ALVEOLOPLASTY N/A 05/11/2016   Procedure: EXTRACTION OF TEETH EIGHTEEN, TWENTY AND TWENTY- NINE;  REMOVAL OF MANDIBULAR TORUS AND EXOSTOSIS;  Surgeon: Diona Browner, DDS;  Location: [place];  Service: Oral Surgery;  Laterality: N/A;  . PORTACATH PLACEMENT  06/18/2011   Procedure: INSERTION PORT-A-CATH;  Surgeon: Edward Jolly, MD;  Location: [place];  Service: General;  Laterality: Right;  right subclavian  . removal portacath  2014  . spur     Apex  spur on both big toes  . TOE SURGERY Bilateral   . TONSILLECTOMY    . TOTAL KNEE ARTHROPLASTY Left 09/13/2014  . TOTAL KNEE ARTHROPLASTY Left 09/13/2014   Procedure: LEFT TOTAL KNEE ARTHROPLASTY;  Surgeon: Rod Can, MD;  Location: [place];  Service: Orthopedics;  Laterality: Left;  . TOTAL KNEE ARTHROPLASTY Right 03/10/2015   Procedure: RIGHT TOTAL KNEE ARTHROPLASTY;  Surgeon: Rod Can, MD;  Location: [facility];  Service: Orthopedics;  Laterality: Right;    There were no vitals filed for this visit.   Subjective Assessment - 09/18/17 1024    Subjective  I'm here for my left arm, the numbness and the soreness. Dr. Jana Hakim felt like I needed to come back.    Pertinent History  Left breast cancer diagnosed 2012, neo-adjuvant chemo completed May 2013, left lumpectomy with ALND June 2013, and radiation treatment completed October 2013; has been on anastrozole since then. Recent CT scan showed no cancer to explain left arm pain; she does have two small lung nodules that are being watched.  She has been treated here for lymphedema some years ago. Fibromyalgia; neuropathy in hands and feet with pain, numbness, and tingling; back problems with surgery 06/07/17 for lumbar fusion and she reports it still hurts a lot; sleep apnea; diverticulitis; arthritis in back and "everywhere"; h/o total knee replacements bilat. in around 2015    Patient Stated Goals  get rid of this discomfort/soreness    Currently in Pain?  Yes    Pain Score  8     Pain Location  Axilla    Pain Orientation  Left    Pain Descriptors / Indicators  Sore    Pain Type  Chronic pain    Aggravating Factors   doing too much, trying to lift things; sewing, even though it's wth right hand    Pain Relieving Factors  rest it         [facility] PT Assessment - 09/18/17 0001      Assessment   Medical Diagnosis  left breast cancer    Referring Provider  Dr. Sarajane Jews Magrinat    Onset Date/Surgical Date  -- 2013    Hand Dominance  Right     Next MD Visit  sees her back doctor 4/23 and will see whether she is to start her back PT again    Prior Therapy  none currentlly; was having therapy on her back after her surgery at [facility], but isn't currently going due to stomach problems      Precautions   Precautions  Other (comment)    Precaution Comments  lumbar fusion 06/07/17; cancer precautions      Restrictions  Weight Bearing Restrictions  No    Other Position/Activity Restrictions  no heavy lifting, use good body mechanics, don't overdo it      Balance Screen   Has the patient fallen in the past 6 months  No    Has the patient had a decrease in activity level because of a fear of falling?   Yes says she just tries to be careful; uses the cane some    Is the patient reluctant to leave their home because of a fear of falling?   Yes      Home Environment   Living Environment  Private residence    Living Arrangements  Other relatives;Children daughter + 7 grandchildren at her house, 5-13 yrs (11 total)    Type of [place]  One level      Prior Function   Level of Independence  Independent with basic ADLs;Independent with household mobility without device;Independent with community mobility with device    Vocation  On disability    Leisure  doesn't do regular exercise      Cognition   Overall Cognitive Status  Within Functional Limits for tasks assessed      Posture/Postural Control   Posture/Postural Control  Postural limitations    Postural Limitations  Rounded Shoulders;Forward head      ROM / Strength   AROM / PROM / Strength  AROM;PROM;Strength      AROM   Overall AROM Comments  scapular mobility looks okay; patient had difficulty with retraction, but seemed just not to know what to do when instructed    AROM Assessment Site  Shoulder    Right/Left Shoulder  Right;Left    Right Shoulder Flexion  123 Degrees    Right Shoulder ABduction  104 Degrees    Right Shoulder Internal  Rotation  70 Degrees in supine    Right Shoulder External Rotation  54 Degrees    Left Shoulder Flexion  113 Degrees hurt in shoulder    Left Shoulder ABduction  89 Degrees    Left Shoulder Internal Rotation  70 Degrees in supine    Left Shoulder External Rotation  34 Degrees      PROM   PROM Assessment Site  Shoulder    Right/Left Shoulder  Right;Left    Right Shoulder Flexion  135 Degrees    Right Shoulder ABduction  105 Degrees    Left Shoulder Flexion  120 Degrees    Left Shoulder ABduction  93 Degrees      Strength   Overall Strength Comments  strength is grossly 3-/5 in both shoulders      Ambulation/Gait   Ambulation/Gait  Yes    Ambulation/Gait Assistance  6: Modified independent (Device/Increase time) uses cane or shopping cart for longer distances                Objective measurements completed on examination: See above findings.                   PT Long Term Goals - 09/18/17 1138      PT LONG TERM GOAL #1   Title  Pt. will be independent in home exercise for shoulder ROM and strengthening.    Time  4    Period  Weeks    Status  New      PT LONG TERM GOAL #2   Title  Pt. will be able to demonstrate erect posture.    Time  4  Period  Weeks    Status  New      PT LONG TERM GOAL #3   Title  Pt. will report at least 40% decrease in left axilla-area pain.    Time  4    Period  Weeks    Status  New             Plan - 09/18/17 1132    Clinical Impression Statement  Pt. is known to this clinic from treatment here several years ago for lymphedema.  She returns now at the suggestion of her oncologist for her complaints of left axilla area pain.  She has limited shoulder AROM and PROM bilaterally, with left more limited than right. Her strength is grossly 3-/5 in shoulders. She will benefit from stretching and strengthening to improve her function.  She has forward head, rounded shoulder posture, and improving this should help as well.      History and Personal Factors relevant to plan of care:  fibromyalgia, arthritis in many joints, recent (06/07/17) lumbar fusion with ongoing back pain, h/o bilat. TKAs    Clinical Presentation  Stable    Clinical Decision Making  Low    Rehab Potential  Fair    Clinical Impairments Affecting Rehab Potential  multiple musculoskeletal problems including arthritis and fibromyalgia    PT Frequency  2x / week    PT Duration  4 weeks    PT Treatment/Interventions  ADLs/Self Care Home Management;Electrical Stimulation;Therapeutic exercise;Patient/family education;Manual techniques;Manual lymph drainage;Scar mobilization;Passive range of motion    PT Next Visit Plan  Begin P/AA/A/ROM to both shoulders with focus on left (which is the area of pain and is more limited); give HEP for same; shoulder and scapular strengthening; posture re-ed; myofascial release techniques.    Consulted and Agree with Plan of Care  Patient       Patient will benefit from skilled therapeutic intervention in order to improve the following deficits and impairments:  Pain, Decreased range of motion, Decreased strength, Impaired UE functional use  Visit Diagnosis: Chronic left shoulder pain - Plan: PT plan of care cert/re-cert  Stiffness of left shoulder, not elsewhere classified - Plan: PT plan of care cert/re-cert  Stiffness of right shoulder, not elsewhere classified - Plan: PT plan of care cert/re-cert  Abnormal posture - Plan: PT plan of care cert/re-cert     Problem List Patient Active Problem List   Diagnosis Date Noted  . Lung nodule 09/15/2017  . Morbid obesity with BMI of 45.0-49.9, adult ([place]) 09/05/2017  . 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 Vertigo 10/12/2016  . Neuropathic pain 06/25/2016  . 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 S/P lumbar spinal fusion 01/27/2016  . H/O therapeutic radiation 09/21/2015  . Pes planus of both feet 08/04/2015  . Primary  osteoarthritis of right knee 03/10/2015  . Osteoarthritis of right knee 02/04/2015  . Primary osteoarthritis of left knee 09/13/2014  . Hot flashes 01/19/2014  . Abdominal pain, unspecified site 01/19/2014  . Malignant neoplasm of upper-outer quadrant of left breast in female, estrogen receptor positive ([place]) 03/19/2013  . Lumbosacral spondylosis without myelopathy 12/30/2012  . Fibromyalgia syndrome 08/15/2012  . Peripheral neuropathy, toxic 08/15/2012  . Lymphedema of arm - left 04/30/2012    SALISBURY,DONNA 09/18/2017, 11:42 AM  [place], [place], [address] Phone: [phone]   Fax:  [phone]  Name: Kelsey Wilson MRN: [id number] Date of Birth: 1960/08/12  Serafina Royals, PT  09/18/17 11:42 AM

## 2017-09-23 ENCOUNTER — Telehealth: Payer: Self-pay | Admitting: Registered Nurse

## 2017-09-23 MED ORDER — HYDROCODONE-ACETAMINOPHEN 5-325 MG PO TABS: 1.0000 | ORAL_TABLET | Freq: Four times a day (QID) | ORAL | 0 refills | Status: DC | PRN

## 2017-09-23 NOTE — Telephone Encounter (Signed)
Placed a call to [name], she is aware hydrocodone was e-scribe. Kelsey Wilson was seen on 09/09/2017. Her last Hydrocodone was picked up on 08/28/2017. She verbalizes understanding.

## 2017-09-24 ENCOUNTER — Other Ambulatory Visit: Payer: Self-pay | Admitting: Adult Health

## 2017-09-24 DIAGNOSIS — C50412 Malignant neoplasm of upper-outer quadrant of left female breast: Secondary | ICD-10-CM

## 2017-09-24 DIAGNOSIS — Z170 Estrogen receptor positive status [ER+]: Secondary | ICD-10-CM

## 2017-09-25 ENCOUNTER — Ambulatory Visit: Payer: Medicare Other

## 2017-09-25 DIAGNOSIS — G8929 Other chronic pain: Secondary | ICD-10-CM

## 2017-09-25 DIAGNOSIS — M25612 Stiffness of left shoulder, not elsewhere classified: Secondary | ICD-10-CM

## 2017-09-25 DIAGNOSIS — R293 Abnormal posture: Secondary | ICD-10-CM

## 2017-09-25 DIAGNOSIS — M25611 Stiffness of right shoulder, not elsewhere classified: Secondary | ICD-10-CM

## 2017-09-25 DIAGNOSIS — M25512 Pain in left shoulder: Secondary | ICD-10-CM | POA: Diagnosis not present

## 2017-09-25 MED FILL — ANASTROZOLE 1 MG TABLET: 1 | 30 days supply | Qty: 30 | Fill #0

## 2017-09-25 MED FILL — HYDROCODON-APAP 5-325: 5-325 | 30 days supply | Qty: 120 | Fill #0

## 2017-09-25 NOTE — Therapy (Signed)
[place], [place], [address] Phone: [phone]   Fax:  [phone]  Physical Therapy Treatment  Patient Details  Name: Kelsey Wilson MRN: [id number] Date of Birth: 21-Apr-1961 Referring Provider: Dr. Lurline Del   Encounter Date: 09/25/2017  PT End of Session - 09/25/17 1457    Visit Number  2    Number of Visits  9    Date for PT Re-Evaluation  11/01/17    PT Start Time  2992    PT Stop Time  1522    PT Time Calculation (min)  43 min    Activity Tolerance  Patient tolerated treatment well    Behavior During Therapy  [facility] for tasks assessed/performed       Past Medical History:  Diagnosis Date  . Anemia   . Anxiety   . Arthritis   . Breast cancer ([place]) 2010   T3N1 invasive ductal carcinoma left breast.Takes Arimidex daily  . Bursitis   . Carpal tunnel syndrome   . Chronic back pain    stenosis  . Constipation    takes Colace daily  . Depression    takes Benzotropine daily  . Diverticulitis of colon   . Dyspnea    daily when walking for over 1 yr.  . Fibromyalgia 08/2012  . GERD (gastroesophageal reflux disease)    takes Dexilant daily  . Hemorrhoid   . History of blood transfusion    no abnormal reaction noted  . History of colon polyps    benign  . History of shingles   . Joint pain   . Joint swelling   . Night muscle spasms    takes Flexeril nightly as needed  . Nocturia   . OSA (obstructive sleep apnea)   . OSA on CPAP   . Peripheral edema    takes Furosemide.Just started 01/18/16  . Peripheral neuropathy    takes Lyrica daily  . Personal history of chemotherapy    2013  . Personal history of radiation therapy    2013  . Pneumonia    hx of-2015  . Seasonal allergies    takes Singulair nightly  . SOB (shortness of breath) on exertion    rarely with exertion  . 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 Alphonzo Grieve)    Past Surgical History:  Procedure Laterality Date  . ABDOMINAL HYSTERECTOMY     still has ovaries  . APPENDECTOMY    . AXILLARY LYMPH NODE DISSECTION  11/28/2011   Procedure: AXILLARY LYMPH NODE DISSECTION;  Surgeon: Edward Jolly, MD;  Location: [place];  Service: General;  Laterality: Left;  . BREAST LUMPECTOMY Left 11/28/2011   Malignant  . BREAST SURGERY Left 2013  . CARPAL TUNNEL RELEASE     Bilateral  . CESAREAN SECTION     pt. has had 3  . CHOLECYSTECTOMY    . COLONOSCOPY    . EYE SURGERY Bilateral    cataract removal  . KNEE SURGERY     Left Knee  . LAMINECTOMY WITH POSTERIOR LATERAL ARTHRODESIS LEVEL 2 N/A 06/07/2017   Procedure: Posterior Lateral Fusion Lumbar Three-Four and Transforaminal Interbody Fusion Lumbar Four-Five with Segmental  Pedicle Screw Fixation;  Surgeon: Eustace Moore, MD;  Location: [place];  Service: Neurosurgery;  Laterality: N/A;  Posterior Lateral Fusion Lumbar Three-Four and Transforaminal Interbody Fusion Lumbar Four-Five with Segmental  Pedicle Screw Fixation   . LUMBAR FUSION  06/07/2017   POST  . LUMBAR LAMINECTOMY/DECOMPRESSION MICRODISCECTOMY Bilateral 01/27/2016   Procedure: Laminectomy and Foraminotomy - Lumbar four -Lumbar five - bilateral- on-lay noninstrumented fusion;  Surgeon: Eustace Moore, MD;  Location: [place] [facility];  Service: Neurosurgery;  Laterality: Bilateral;  . MULTIPLE EXTRACTIONS WITH ALVEOLOPLASTY N/A 05/11/2016   Procedure: EXTRACTION OF TEETH EIGHTEEN, TWENTY AND TWENTY- NINE;  REMOVAL OF MANDIBULAR TORUS AND EXOSTOSIS;  Surgeon: Diona Browner, DDS;  Location: [place];  Service: Oral Surgery;  Laterality: N/A;  . PORTACATH PLACEMENT  06/18/2011   Procedure: INSERTION PORT-A-CATH;  Surgeon: Edward Jolly, MD;  Location: [place];  Service: General;  Laterality: Right;  right subclavian  . removal portacath  2014  . spur     Apex  spur on both big toes  . TOE SURGERY Bilateral   . TONSILLECTOMY    . TOTAL KNEE ARTHROPLASTY Left 09/13/2014  . TOTAL KNEE ARTHROPLASTY Left 09/13/2014   Procedure: LEFT TOTAL KNEE ARTHROPLASTY;  Surgeon: Rod Can, MD;  Location: [place];  Service: Orthopedics;  Laterality: Left;  . TOTAL KNEE ARTHROPLASTY Right 03/10/2015   Procedure: RIGHT TOTAL KNEE ARTHROPLASTY;  Surgeon: Rod Can, MD;  Location: [facility];  Service: Orthopedics;  Laterality: Right;    There were no vitals filed for this visit.  Subjective Assessment - 09/25/17 1445    Subjective  My Lt shoulder is just kind of hurting today. I saw my back surgeon Dr. Ronnald Ramp yesterday and since I'm still having pain they are going to give me a steroid shot and if that doesn't help my pain they will do another scan to see if antyhing has changed since the surgery in January.     Pertinent History  Left breast cancer diagnosed 2012, neo-adjuvant chemo completed May 2013, left lumpectomy with ALND June 2013, and radiation treatment completed October 2013; has been on anastrozole since then. Recent CT scan showed no cancer to explain left arm pain; she does have two small lung nodules that are being watched.  She has been treated here for lymphedema some years ago. Fibromyalgia; neuropathy in hands and feet with pain, numbness, and tingling; back problems with surgery 06/07/17 for lumbar fusion and she reports it still hurts a lot; sleep apnea; diverticulitis; arthritis in back and "everywhere"; h/o total knee replacements bilat. in around 2015    Patient Stated Goals  get rid of this discomfort/soreness    Currently in Pain?  Yes    Pain Score  7     Pain Location  Shoulder    Pain Orientation  Left    Pain Descriptors / Indicators  Aching    Pain Type  Chronic pain    Pain Onset  More than a month ago    Pain Frequency  Intermittent    Aggravating Factors   overusing my arm     Pain Relieving Factors  resting it                        [facility] Adult PT Treatment/Exercise - 09/25/17 0001      Shoulder Exercises: Supine   Horizontal ABduction  AAROM;Right;Left;5 reps 5 sec holds with dowel    External Rotation  AAROM;Right;Left;5 reps 5 sec holds with dowel    Flexion  AAROM;Both;5 reps 5 sec holds with dowel    Other Supine Exercises  fingers clasped behind head and then abducting elbows until stretch  in pectoralis felt, 5 times, 5 sec holds      Manual Therapy   Manual Therapy  Passive ROM    Passive ROM  In Supine to Lt shoulder into flexion, abduction and er all to pts tolerance, then same at Rt side             PT Education - 09/25/17 1454    Education provided  Yes    Education Details  Supine dowel exercises    Person(s) Educated  Patient    Methods  Explanation;Demonstration;Handout    Comprehension  Verbalized understanding;Returned demonstration          PT Long Term Goals - 09/18/17 1138      PT LONG TERM GOAL #1   Title  Pt. will be independent in home exercise for shoulder ROM and strengthening.    Time  4    Period  Weeks    Status  New      PT LONG TERM GOAL #2   Title  Pt. will be able to demonstrate erect posture.    Time  4    Period  Weeks    Status  New      PT LONG TERM GOAL #3   Title  Pt. will report at least 40% decrease in left axilla-area pain.    Time  4    Period  Weeks    Status  New            Plan - 09/25/17 1458    Clinical Impression Statement  Pt tolerated first session of stretching very well. She was guarded with stretching but this did not stop of [place] from being able to stretch to her end ROM. Added supine dowel exercises to her HEP as she tolerated these well.     Rehab Potential  Fair    Clinical Impairments Affecting Rehab Potential  multiple musculoskeletal problems including arthritis and fibromyalgia    PT Frequency  2x / week    PT Duration  4 weeks    PT Treatment/Interventions  ADLs/Self Care Home  Management;Electrical Stimulation;Therapeutic exercise;Patient/family education;Manual techniques;Manual lymph drainage;Scar mobilization;Passive range of motion    PT Next Visit Plan  Cont P/AA/A/ROM to both shoulders with focus on left (which is the area of pain and is more limited); review HEP and progress prn; shoulder and scapular strengthening; posture re-ed; myofascial release techniques.    Consulted and Agree with Plan of Care  Patient       Patient will benefit from skilled therapeutic intervention in order to improve the following deficits and impairments:  Pain, Decreased range of motion, Decreased strength, Impaired UE functional use  Visit Diagnosis: Chronic left shoulder pain  Stiffness of left shoulder, not elsewhere classified  Stiffness of right shoulder, not elsewhere classified  Abnormal posture     Problem List Patient Active Problem List   Diagnosis Date Noted  . Lung nodule 09/15/2017  . Morbid obesity with BMI of 45.0-49.9, adult ([place]) 09/05/2017  . 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 Vertigo 10/12/2016  . Neuropathic pain 06/25/2016  . 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 S/P lumbar spinal fusion 01/27/2016  . H/O therapeutic radiation 09/21/2015  . Pes planus of both feet 08/04/2015  . Primary osteoarthritis of right knee 03/10/2015  . Osteoarthritis of right knee 02/04/2015  . Primary osteoarthritis of left knee 09/13/2014  . Hot flashes 01/19/2014  . Abdominal pain, unspecified site 01/19/2014  . Malignant neoplasm of upper-outer  quadrant of left breast in female, estrogen receptor positive ([place]) 03/19/2013  . Lumbosacral spondylosis without myelopathy 12/30/2012  . Fibromyalgia syndrome 08/15/2012  . Peripheral neuropathy, toxic 08/15/2012  . Lymphedema of arm - left 04/30/2012    Otelia Limes, PTA 09/25/2017, 3:24 PM  [place], [place], [address] Phone: [phone]   Fax:  [phone]  Name: Kelsey Wilson MRN: [id number] Date of Birth: 10/14/1960

## 2017-09-25 NOTE — Patient Instructions (Signed)

## 2017-10-03 ENCOUNTER — Ambulatory Visit: Payer: Medicare Other | Admitting: Nurse Practitioner

## 2017-10-04 ENCOUNTER — Ambulatory Visit: Payer: Medicare Other | Attending: Oncology | Admitting: Physical Therapy

## 2017-10-04 DIAGNOSIS — M25612 Stiffness of left shoulder, not elsewhere classified: Secondary | ICD-10-CM | POA: Diagnosis present

## 2017-10-04 DIAGNOSIS — G8929 Other chronic pain: Secondary | ICD-10-CM | POA: Diagnosis present

## 2017-10-04 DIAGNOSIS — M25611 Stiffness of right shoulder, not elsewhere classified: Secondary | ICD-10-CM | POA: Diagnosis present

## 2017-10-04 DIAGNOSIS — R293 Abnormal posture: Secondary | ICD-10-CM | POA: Insufficient documentation

## 2017-10-04 DIAGNOSIS — M25512 Pain in left shoulder: Secondary | ICD-10-CM | POA: Diagnosis present

## 2017-10-04 NOTE — Therapy (Signed)
[place], [place], [address] Phone: [phone]   Fax:  [phone]  Physical Therapy Treatment  Patient Details  Name: Kelsey Wilson MRN: [id number] Date of Birth: Dec 29, 1960 Referring Provider: Dr. Lurline Del   Encounter Date: 10/04/2017  PT End of Session - 10/04/17 1235    Visit Number  3    Number of Visits  9    Date for PT Re-Evaluation  11/01/17    PT Start Time  1021    PT Stop Time  1103    PT Time Calculation (min)  42 min    Activity Tolerance  Patient tolerated treatment well    Behavior During Therapy  [facility] for tasks assessed/performed       Past Medical History:  Diagnosis Date  . Anemia   . Anxiety   . Arthritis   . Breast cancer ([place]) 2010   T3N1 invasive ductal carcinoma left breast.Takes Arimidex daily  . Bursitis   . Carpal tunnel syndrome   . Chronic back pain    stenosis  . Constipation    takes Colace daily  . Depression    takes Benzotropine daily  . Diverticulitis of colon   . Dyspnea    daily when walking for over 1 yr.  . Fibromyalgia 08/2012  . GERD (gastroesophageal reflux disease)    takes Dexilant daily  . Hemorrhoid   . History of blood transfusion    no abnormal reaction noted  . History of colon polyps    benign  . History of shingles   . Joint pain   . Joint swelling   . Night muscle spasms    takes Flexeril nightly as needed  . Nocturia   . OSA (obstructive sleep apnea)   . OSA on CPAP   . Peripheral edema    takes Furosemide.Just started 01/18/16  . Peripheral neuropathy    takes Lyrica daily  . Personal history of chemotherapy    2013  . Personal history of radiation therapy    2013  . Pneumonia    hx of-2015  . Seasonal allergies    takes Singulair nightly  . SOB (shortness of breath) on exertion    rarely with exertion  . 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 Alphonzo Grieve)    Past Surgical History:  Procedure Laterality Date  . ABDOMINAL HYSTERECTOMY     still has ovaries  . APPENDECTOMY    . AXILLARY LYMPH NODE DISSECTION  11/28/2011   Procedure: AXILLARY LYMPH NODE DISSECTION;  Surgeon: Edward Jolly, MD;  Location: [place];  Service: General;  Laterality: Left;  . BREAST LUMPECTOMY Left 11/28/2011   Malignant  . BREAST SURGERY Left 2013  . CARPAL TUNNEL RELEASE     Bilateral  . CESAREAN SECTION     pt. has had 3  . CHOLECYSTECTOMY    . COLONOSCOPY    . EYE SURGERY Bilateral    cataract removal  . KNEE SURGERY     Left Knee  . LAMINECTOMY WITH POSTERIOR LATERAL ARTHRODESIS LEVEL 2 N/A 06/07/2017   Procedure: Posterior Lateral Fusion Lumbar Three-Four and Transforaminal Interbody Fusion Lumbar Four-Five with Segmental  Pedicle Screw Fixation;  Surgeon: Eustace Moore, MD;  Location: [place];  Service: Neurosurgery;  Laterality: N/A;  Posterior Lateral Fusion Lumbar Three-Four and Transforaminal Interbody Fusion Lumbar Four-Five with Segmental  Pedicle Screw Fixation   . LUMBAR FUSION  06/07/2017   POST  . LUMBAR LAMINECTOMY/DECOMPRESSION MICRODISCECTOMY Bilateral 01/27/2016   Procedure: Laminectomy and Foraminotomy - Lumbar four -Lumbar five - bilateral- on-lay noninstrumented fusion;  Surgeon: Eustace Moore, MD;  Location: [facility];  Service: Neurosurgery;  Laterality: Bilateral;  . MULTIPLE EXTRACTIONS WITH ALVEOLOPLASTY N/A 05/11/2016   Procedure: EXTRACTION OF TEETH EIGHTEEN, TWENTY AND TWENTY- NINE;  REMOVAL OF MANDIBULAR TORUS AND EXOSTOSIS;  Surgeon: Diona Browner, DDS;  Location: [place];  Service: Oral Surgery;  Laterality: N/A;  . PORTACATH PLACEMENT  06/18/2011   Procedure: INSERTION PORT-A-CATH;  Surgeon: Edward Jolly, MD;  Location: [place];  Service: General;  Laterality: Right;  right subclavian  . removal portacath  2014  . spur     Apex  spur on both big toes  . TOE SURGERY Bilateral   . TONSILLECTOMY    . TOTAL KNEE ARTHROPLASTY Left 09/13/2014  . TOTAL KNEE ARTHROPLASTY Left 09/13/2014   Procedure: LEFT TOTAL KNEE ARTHROPLASTY;  Surgeon: Rod Can, MD;  Location: [place];  Service: Orthopedics;  Laterality: Left;  . TOTAL KNEE ARTHROPLASTY Right 03/10/2015   Procedure: RIGHT TOTAL KNEE ARTHROPLASTY;  Surgeon: Rod Can, MD;  Location: [facility];  Service: Orthopedics;  Laterality: Right;    There were no vitals filed for this visit.  Subjective Assessment - 10/04/17 1024    Subjective  Left hip area is still hurting since getting a steroid shot on Monday. If it's not better by 5/20 when she sees the surgeon, she thinks they'll do an MRI.  Hasn't been doing the shoulder exercises she learned much, saying she is "just lazy" and also busy with appointments; says she doesn't have questions about it.    Pertinent History  Left breast cancer diagnosed 2012, neo-adjuvant chemo completed May 2013, left lumpectomy with ALND June 2013, and radiation treatment completed October 2013; has been on anastrozole since then. Recent CT scan showed no cancer to explain left arm pain; she does have two small lung nodules that are being watched.  She has been treated here for lymphedema some years ago. Fibromyalgia; neuropathy in hands and feet with pain, numbness, and tingling; back problems with surgery 06/07/17 for lumbar fusion and she reports it still hurts a lot; sleep apnea; diverticulitis; arthritis in back and "everywhere"; h/o total knee replacements bilat. in around 2015    Currently in Pain?  Yes    Pain Score  7     Pain Location  Hip    Pain Orientation  Left;Posterior    Pain Descriptors / Indicators  Aching;Other (Comment) "just painful, I just feel it"    Pain Type  Chronic pain    Aggravating Factors   being up on it, turning in the bed    Pain Relieving Factors  tries to lie on her back instead of on her side          [facility] PT Assessment - 10/04/17 0001      AROM   Left Shoulder Flexion  117 Degrees after stretching today    Left Shoulder ABduction  100 Degrees                   [facility] Adult PT Treatment/Exercise - 10/04/17 0001      Shoulder Exercises: Supine   Horizontal ABduction  Strengthening;Both;10 reps;Theraband yellow Theraband    Flexion  Strengthening;Both;10 reps;Theraband narrow grip on yellow Theraband      Shoulder  Exercises: Seated   Other Seated Exercises  backward shoulder rolls x 6      Manual Therapy   Manual Therapy  Myofascial release    Myofascial Release  tried left UE myofascial pulling with movement into abduction but stopped as this was less comfortable than straight abduc    Passive ROM  In supine to left shoulder into er, abduction, and flexion; then same for right, but greater time spent on left, as it is tighter worked on slow, progressive stretching                  PT Long Term Goals - 09/18/17 1138      PT LONG TERM GOAL #1   Title  Pt. will be independent in home exercise for shoulder ROM and strengthening.    Time  4    Period  Weeks    Status  New      PT LONG TERM GOAL #2   Title  Pt. will be able to demonstrate erect posture.    Time  4    Period  Weeks    Status  New      PT LONG TERM GOAL #3   Title  Pt. will report at least 40% decrease in left axilla-area pain.    Time  4    Period  Weeks    Status  New            Plan - 10/04/17 1235    Clinical Impression Statement  Pt. did well with slow progressive stretching again today. She has gained just a few degrees of motion in flexion and abduction of left shoulder. She has not done her HEP much, which she reports is because she is lazy and has a lot of appointments (her words). She was encouraged to do these exercises daily.     Rehab Potential  Fair    Clinical Impairments Affecting Rehab Potential  multiple musculoskeletal problems including arthritis and  fibromyalgia    PT Duration  4 weeks    PT Treatment/Interventions  ADLs/Self Care Home Management;Electrical Stimulation;Therapeutic exercise;Patient/family education;Manual techniques;Manual lymph drainage;Scar mobilization;Passive range of motion    PT Next Visit Plan  Cont P/AA/A/ROM to both shoulders with focus on left (which is the area of pain and is more limited); consider adding supine scapular series to HEP; shoulder and scapular strengthening; posture re-ed; myofascial release techniques.    Consulted and Agree with Plan of Care  Patient       Patient will benefit from skilled therapeutic intervention in order to improve the following deficits and impairments:  Pain, Decreased range of motion, Decreased strength, Impaired UE functional use  Visit Diagnosis: Chronic left shoulder pain  Stiffness of left shoulder, not elsewhere classified  Stiffness of right shoulder, not elsewhere classified  Abnormal posture     Problem List Patient Active Problem List   Diagnosis Date Noted  . Lung nodule 09/15/2017  . Morbid obesity with BMI of 45.0-49.9, adult ([place]) 09/05/2017  . 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 Vertigo 10/12/2016  . Neuropathic pain 06/25/2016  . 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 S/P lumbar spinal fusion 01/27/2016  . H/O therapeutic radiation 09/21/2015  . Pes planus of both feet 08/04/2015  . Primary osteoarthritis of right knee 03/10/2015  . Osteoarthritis of right knee 02/04/2015  . Primary osteoarthritis of left knee 09/13/2014  . Hot flashes 01/19/2014  . Abdominal pain, unspecified site 01/19/2014  . Malignant neoplasm  of upper-outer quadrant of left breast in female, estrogen receptor positive ([place]) 03/19/2013  . Lumbosacral spondylosis without myelopathy 12/30/2012  . Fibromyalgia syndrome 08/15/2012  . Peripheral neuropathy, toxic 08/15/2012  . Lymphedema of arm - left 04/30/2012     Shaconda Hajduk 10/04/2017, 12:38 PM  [place], [place], [address] Phone: [phone]   Fax:  [phone]  Name: Kelsey Wilson MRN: [id number] Date of Birth: June 03, 1961  Serafina Royals, PT 10/04/17 12:38 PM

## 2017-10-07 ENCOUNTER — Ambulatory Visit: Payer: Medicare Other

## 2017-10-07 DIAGNOSIS — M25512 Pain in left shoulder: Principal | ICD-10-CM

## 2017-10-07 DIAGNOSIS — M25611 Stiffness of right shoulder, not elsewhere classified: Secondary | ICD-10-CM

## 2017-10-07 DIAGNOSIS — M25612 Stiffness of left shoulder, not elsewhere classified: Secondary | ICD-10-CM

## 2017-10-07 DIAGNOSIS — G8929 Other chronic pain: Secondary | ICD-10-CM

## 2017-10-07 DIAGNOSIS — R293 Abnormal posture: Secondary | ICD-10-CM

## 2017-10-07 NOTE — Therapy (Signed)
[place], [place], [address] Phone: [phone]   Fax:  [phone]  Physical Therapy Treatment  Patient Details  Name: Kelsey Wilson MRN: [id number] Date of Birth: 12-29-1960 Referring Provider: Dr. Lurline Del   Encounter Date: 10/07/2017  PT End of Session - 10/07/17 0957    Visit Number  4    Number of Visits  9    Date for PT Re-Evaluation  11/01/17    PT Start Time  0937    PT Stop Time  1018    PT Time Calculation (min)  41 min    Activity Tolerance  Patient tolerated treatment well    Behavior During Therapy  [facility] for tasks assessed/performed       Past Medical History:  Diagnosis Date  . Anemia   . Anxiety   . Arthritis   . Breast cancer ([place]) 2010   T3N1 invasive ductal carcinoma left breast.Takes Arimidex daily  . Bursitis   . Carpal tunnel syndrome   . Chronic back pain    stenosis  . Constipation    takes Colace daily  . Depression    takes Benzotropine daily  . Diverticulitis of colon   . Dyspnea    daily when walking for over 1 yr.  . Fibromyalgia 08/2012  . GERD (gastroesophageal reflux disease)    takes Dexilant daily  . Hemorrhoid   . History of blood transfusion    no abnormal reaction noted  . History of colon polyps    benign  . History of shingles   . Joint pain   . Joint swelling   . Night muscle spasms    takes Flexeril nightly as needed  . Nocturia   . OSA (obstructive sleep apnea)   . OSA on CPAP   . Peripheral edema    takes Furosemide.Just started 01/18/16  . Peripheral neuropathy    takes Lyrica daily  . Personal history of chemotherapy    2013  . Personal history of radiation therapy    2013  . Pneumonia    hx of-2015  . Seasonal allergies    takes Singulair nightly  . SOB (shortness of breath) on exertion    rarely with exertion  . 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 Alphonzo Grieve)    Past Surgical History:  Procedure Laterality Date  . ABDOMINAL HYSTERECTOMY     still has ovaries  . APPENDECTOMY    . AXILLARY LYMPH NODE DISSECTION  11/28/2011   Procedure: AXILLARY LYMPH NODE DISSECTION;  Surgeon: Edward Jolly, MD;  Location: [place];  Service: General;  Laterality: Left;  . BREAST LUMPECTOMY Left 11/28/2011   Malignant  . BREAST SURGERY Left 2013  . CARPAL TUNNEL RELEASE     Bilateral  . CESAREAN SECTION     pt. has had 3  . CHOLECYSTECTOMY    . COLONOSCOPY    . EYE SURGERY Bilateral    cataract removal  . KNEE SURGERY     Left Knee  . LAMINECTOMY WITH POSTERIOR LATERAL ARTHRODESIS LEVEL 2 N/A 06/07/2017   Procedure: Posterior Lateral Fusion Lumbar Three-Four and Transforaminal Interbody Fusion Lumbar Four-Five with Segmental  Pedicle Screw Fixation;  Surgeon: Eustace Moore, MD;  Location: [place];  Service: Neurosurgery;  Laterality: N/A;  Posterior Lateral Fusion Lumbar Three-Four and Transforaminal Interbody Fusion Lumbar Four-Five with Segmental  Pedicle Screw Fixation   . LUMBAR FUSION  06/07/2017   POST  . LUMBAR LAMINECTOMY/DECOMPRESSION MICRODISCECTOMY Bilateral 01/27/2016   Procedure: Laminectomy and Foraminotomy - Lumbar four -Lumbar five - bilateral- on-lay noninstrumented fusion;  Surgeon: Eustace Moore, MD;  Location: [place] [facility];  Service: Neurosurgery;  Laterality: Bilateral;  . MULTIPLE EXTRACTIONS WITH ALVEOLOPLASTY N/A 05/11/2016   Procedure: EXTRACTION OF TEETH EIGHTEEN, TWENTY AND TWENTY- NINE;  REMOVAL OF MANDIBULAR TORUS AND EXOSTOSIS;  Surgeon: Diona Browner, DDS;  Location: [place];  Service: Oral Surgery;  Laterality: N/A;  . PORTACATH PLACEMENT  06/18/2011   Procedure: INSERTION PORT-A-CATH;  Surgeon: Edward Jolly, MD;  Location: [place];  Service: General;  Laterality: Right;  right subclavian  . removal portacath  2014  . spur     Apex  spur on both big toes  . TOE SURGERY Bilateral   . TONSILLECTOMY    . TOTAL KNEE ARTHROPLASTY Left 09/13/2014  . TOTAL KNEE ARTHROPLASTY Left 09/13/2014   Procedure: LEFT TOTAL KNEE ARTHROPLASTY;  Surgeon: Rod Can, MD;  Location: [place];  Service: Orthopedics;  Laterality: Left;  . TOTAL KNEE ARTHROPLASTY Right 03/10/2015   Procedure: RIGHT TOTAL KNEE ARTHROPLASTY;  Surgeon: Rod Can, MD;  [facility];  Service: Orthopedics;  Laterality: Right;    There were no vitals filed for this visit.  Subjective Assessment - 10/07/17 0940    Subjective  I did the exericses last night and my Lt shoulder feels okay today. My Lt hip is still bothering me so I guess I'll have to get that MRI when I see my surgeon.      Pertinent History  Left breast cancer diagnosed 2012, neo-adjuvant chemo completed May 2013, left lumpectomy with ALND June 2013, and radiation treatment completed October 2013; has been on anastrozole since then. Recent CT scan showed no cancer to explain left arm pain; she does have two small lung nodules that are being watched.  She has been treated here for lymphedema some years ago. Fibromyalgia; neuropathy in hands and feet with pain, numbness, and tingling; back problems with surgery 06/07/17 for lumbar fusion and she reports it still hurts a lot; sleep apnea; diverticulitis; arthritis in back and "everywhere"; h/o total knee replacements bilat. in around 2015    Patient Stated Goals  get rid of this discomfort/soreness    Currently in Pain?  Yes    Pain Score  7     Pain Location  Hip    Pain Orientation  Left;Posterior    Pain Descriptors / Indicators  Aching    Pain Type  Chronic pain    Pain Onset  More than a month ago    Pain Frequency  Intermittent    Aggravating Factors   being on it, turning in the bed    Pain Relieving Factors  supine better than S/L                       [facility] Adult PT Treatment/Exercise - 10/07/17 0001      Shoulder  Exercises: Supine   Horizontal ABduction  Strengthening;Both;10 reps;Theraband    Theraband Level (Shoulder Horizontal ABduction)  Level 1 (Yellow)    External Rotation  Strengthening;Both;10 reps;Theraband    Theraband Level (Shoulder External Rotation)  Level 1 (Yellow)    Flexion  Strengthening;Both;10 reps;Theraband Narrow and Wide Grip, 10 times each    Theraband Level (Shoulder Flexion)  Level 1 (Yellow)    Other Supine  Exercises  Bil D2 with yellow theraband 5 times each; for this and above supine scapular exercises pt returned therpapist dmeonstration for all and VCs given for encouragement of full ROM.      Shoulder Exercises: Pulleys   Flexion  2 minutes    Flexion Limitations  Pt returned therapist demonstration but required VCs to decrease Lt scapular compensation multiple times    ABduction  2 minutes    ABduction Limitations  Pt returned therapist demonstration, also VCs throughout to decrease Lt scapular compensation      Shoulder Exercises: Therapy Ball   Flexion  Both;10 reps With forward lean into end of stretch    Flexion Limitations  VCs to only stretch as tolerated, not to push into pain    ABduction  Left;5 reps Same side lean into end of ROM    ABduction Limitations  Demonstration throughout to cont to assess technique      Shoulder Exercises: Stretch   Wall Stretch - ABduction  5 reps 2 x 5 with 5 sec holds; "snow angel" with back against wall      Manual Therapy   Passive ROM  In supine to left shoulder into er, abduction, and flexion; was able to get to pts end ROM as pt fell asleep during session and muscles were relaxed. She reported shoulder feeling very good after session. continued with slow, progressive stretching             PT Education - 10/07/17 0956    Education provided  Yes    Education Details  Supine scapular exericses with yellow theraband    Person(s) Educated  Patient    Methods  Explanation;Demonstration;Handout;Verbal cues     Comprehension  Verbalized understanding;Returned demonstration;Verbal cues required;Need further instruction          PT Long Term Goals - 09/18/17 1138      PT LONG TERM GOAL #1   Title  Pt. will be independent in home exercise for shoulder ROM and strengthening.    Time  4    Period  Weeks    Status  New      PT LONG TERM GOAL #2   Title  Pt. will be able to demonstrate erect posture.    Time  4    Period  Weeks    Status  New      PT LONG TERM GOAL #3   Title  Pt. will report at least 40% decrease in left axilla-area pain.    Time  4    Period  Weeks    Status  New            Plan - 10/07/17 3016    Clinical Impression Statement  Progressed pt to include supine scapular series today which she seemed to tolerate well. Has grimacing facial expressions at end of motion but reports it feels okay, so just encouraged her to work within pain tolerance. She verbalized understanding this. Also continued with slow, progressive stretching which she tolerates best.     Rehab Potential  Fair    Clinical Impairments Affecting Rehab Potential  multiple musculoskeletal problems including arthritis and fibromyalgia    PT Frequency  2x / week    PT Duration  4 weeks    PT Treatment/Interventions  ADLs/Self Care Home Management;Electrical Stimulation;Therapeutic exercise;Patient/family education;Manual techniques;Manual lymph drainage;Scar mobilization;Passive range of motion    PT Next Visit Plan  Cont P/AA/A/ROM to both shoulders with focus on left (which is the area of  pain and is more limited); review supine scapular series to HEP; shoulder and scapular strengthening; posture re-ed; myofascial release techniques.    Consulted and Agree with Plan of Care  Patient       Patient will benefit from skilled therapeutic intervention in order to improve the following deficits and impairments:  Pain, Decreased range of motion, Decreased strength, Impaired UE functional use  Visit  Diagnosis: Chronic left shoulder pain  Stiffness of left shoulder, not elsewhere classified  Stiffness of right shoulder, not elsewhere classified  Abnormal posture     Problem List Patient Active Problem List   Diagnosis Date Noted  . Lung nodule 09/15/2017  . Morbid obesity with BMI of 45.0-49.9, adult ([place]) 09/05/2017  . 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 Vertigo 10/12/2016  . Neuropathic pain 06/25/2016  . 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 S/P lumbar spinal fusion 01/27/2016  . H/O therapeutic radiation 09/21/2015  . Pes planus of both feet 08/04/2015  . Primary osteoarthritis of right knee 03/10/2015  . Osteoarthritis of right knee 02/04/2015  . Primary osteoarthritis of left knee 09/13/2014  . Hot flashes 01/19/2014  . Abdominal pain, unspecified site 01/19/2014  . Malignant neoplasm of upper-outer quadrant of left breast in female, estrogen receptor positive ([place]) 03/19/2013  . Lumbosacral spondylosis without myelopathy 12/30/2012  . Fibromyalgia syndrome 08/15/2012  . Peripheral neuropathy, toxic 08/15/2012  . Lymphedema of arm - left 04/30/2012    Otelia Limes, PTA 10/07/2017, 10:20 AM  [place], [place], [address] Phone: [phone]   Fax:  [phone]  Name: Kelsey Wilson MRN: [id number] Date of Birth: 07-26-1960

## 2017-10-07 NOTE — Patient Instructions (Signed)

## 2017-10-11 ENCOUNTER — Ambulatory Visit: Payer: Medicare Other | Admitting: Physical Therapy

## 2017-10-15 ENCOUNTER — Ambulatory Visit: Payer: Medicare Other

## 2017-10-15 DIAGNOSIS — M25612 Stiffness of left shoulder, not elsewhere classified: Secondary | ICD-10-CM

## 2017-10-15 DIAGNOSIS — M25611 Stiffness of right shoulder, not elsewhere classified: Secondary | ICD-10-CM

## 2017-10-15 DIAGNOSIS — M25512 Pain in left shoulder: Secondary | ICD-10-CM | POA: Diagnosis not present

## 2017-10-15 DIAGNOSIS — G8929 Other chronic pain: Secondary | ICD-10-CM

## 2017-10-15 DIAGNOSIS — R293 Abnormal posture: Secondary | ICD-10-CM

## 2017-10-15 NOTE — Therapy (Signed)
[place], [place], [address] Phone: [phone]   Fax:  [phone]  Physical Therapy Treatment  Patient Details  Name: Kelsey Wilson MRN: [id number] Date of Birth: Dec 06, 1960 Referring Provider: Dr. Lurline Del   Encounter Date: 10/15/2017  PT End of Session - 10/15/17 1007    Visit Number  5    Number of Visits  9    Date for PT Re-Evaluation  11/01/17    PT Start Time  0938    PT Stop Time  1002 Pt had to leave early    PT Time Calculation (min)  24 min    Activity Tolerance  Patient tolerated treatment well    Behavior During Therapy  [facility] for tasks assessed/performed       Past Medical History:  Diagnosis Date  . Anemia   . Anxiety   . Arthritis   . Breast cancer ([place]) 2010   T3N1 invasive ductal carcinoma left breast.Takes Arimidex daily  . Bursitis   . Carpal tunnel syndrome   . Chronic back pain    stenosis  . Constipation    takes Colace daily  . Depression    takes Benzotropine daily  . Diverticulitis of colon   . Dyspnea    daily when walking for over 1 yr.  . Fibromyalgia 08/2012  . GERD (gastroesophageal reflux disease)    takes Dexilant daily  . Hemorrhoid   . History of blood transfusion    no abnormal reaction noted  . History of colon polyps    benign  . History of shingles   . Joint pain   . Joint swelling   . Night muscle spasms    takes Flexeril nightly as needed  . Nocturia   . OSA (obstructive sleep apnea)   . OSA on CPAP   . Peripheral edema    takes Furosemide.Just started 01/18/16  . Peripheral neuropathy    takes Lyrica daily  . Personal history of chemotherapy    2013  . Personal history of radiation therapy    2013  . Pneumonia    hx of-2015  . Seasonal allergies    takes Singulair nightly  . SOB (shortness of breath) on exertion    rarely with exertion  . 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 Alphonzo Grieve)    Past Surgical History:  Procedure Laterality Date  . ABDOMINAL HYSTERECTOMY     still has ovaries  . APPENDECTOMY    . AXILLARY LYMPH NODE DISSECTION  11/28/2011   Procedure: AXILLARY LYMPH NODE DISSECTION;  Surgeon: Edward Jolly, MD;  Location: [place];  Service: General;  Laterality: Left;  . BREAST LUMPECTOMY Left 11/28/2011   Malignant  . BREAST SURGERY Left 2013  . CARPAL TUNNEL RELEASE     Bilateral  . CESAREAN SECTION     pt. has had 3  . CHOLECYSTECTOMY    . COLONOSCOPY    . EYE SURGERY Bilateral    cataract removal  . KNEE SURGERY     Left Knee  . LAMINECTOMY WITH POSTERIOR LATERAL ARTHRODESIS LEVEL 2 N/A 06/07/2017   Procedure: Posterior Lateral Fusion Lumbar Three-Four and Transforaminal Interbody Fusion Lumbar Four-Five with Segmental  Pedicle Screw Fixation;  Surgeon: Eustace Moore, MD;  Location: [place];  Service: Neurosurgery;  Laterality: N/A;  Posterior Lateral Fusion Lumbar Three-Four and Transforaminal Interbody Fusion  Lumbar Four-Five with Segmental  Pedicle Screw Fixation   . LUMBAR FUSION  06/07/2017   POST  . LUMBAR LAMINECTOMY/DECOMPRESSION MICRODISCECTOMY Bilateral 01/27/2016   Procedure: Laminectomy and Foraminotomy - Lumbar four -Lumbar five - bilateral- on-lay noninstrumented fusion;  Surgeon: Eustace Moore, MD;  Location: [place] [facility];  Service: Neurosurgery;  Laterality: Bilateral;  . MULTIPLE EXTRACTIONS WITH ALVEOLOPLASTY N/A 05/11/2016   Procedure: EXTRACTION OF TEETH EIGHTEEN, TWENTY AND TWENTY- NINE;  REMOVAL OF MANDIBULAR TORUS AND EXOSTOSIS;  Surgeon: Diona Browner, DDS;  Location: [place];  Service: Oral Surgery;  Laterality: N/A;  . PORTACATH PLACEMENT  06/18/2011   Procedure: INSERTION PORT-A-CATH;  Surgeon: Edward Jolly, MD;  Location: [place];  Service: General;  Laterality: Right;  right subclavian  . removal portacath  2014  .  spur     Apex spur on both big toes  . TOE SURGERY Bilateral   . TONSILLECTOMY    . TOTAL KNEE ARTHROPLASTY Left 09/13/2014  . TOTAL KNEE ARTHROPLASTY Left 09/13/2014   Procedure: LEFT TOTAL KNEE ARTHROPLASTY;  Surgeon: Rod Can, MD;  Location: [place];  Service: Orthopedics;  Laterality: Left;  . TOTAL KNEE ARTHROPLASTY Right 03/10/2015   Procedure: RIGHT TOTAL KNEE ARTHROPLASTY;  Surgeon: Rod Can, MD;  Location: [facility];  Service: Orthopedics;  Laterality: Right;    There were no vitals filed for this visit.  Subjective Assessment - 10/15/17 0940    Subjective  I'm seeing my stomach doctor tomorrow (Dr. Loletha Carrow, her gastroenterologist) for the stomach pains I've been having. I've tried the theraband exercises you gave me once or twice since I was here last.     Pertinent History  Left breast cancer diagnosed 2012, neo-adjuvant chemo completed May 2013, left lumpectomy with ALND June 2013, and radiation treatment completed October 2013; has been on anastrozole since then. Recent CT scan showed no cancer to explain left arm pain; she does have two small lung nodules that are being watched.  She has been treated here for lymphedema some years ago. Fibromyalgia; neuropathy in hands and feet with pain, numbness, and tingling; back problems with surgery 06/07/17 for lumbar fusion and she reports it still hurts a lot; sleep apnea; diverticulitis; arthritis in back and "everywhere"; h/o total knee replacements bilat. in around 2015    Patient Stated Goals  get rid of this discomfort/soreness    Currently in Pain?  Yes    Pain Score  7     Pain Location  Axilla    Pain Orientation  Left    Pain Descriptors / Indicators  Burning    Pain Type  Surgical pain    Pain Onset  More than a month ago    Pain Frequency  Intermittent    Aggravating Factors   just come and goes    Pain Relieving Factors  stretching helps some                       [facility] Adult PT Treatment/Exercise -  10/15/17 0001      Manual Therapy   Myofascial Release  --    Passive ROM  In supine to left shoulder into er, abduction, and flexion; was able to get to pts end ROM continuing with slow, progressive stretching giving pt time to relax into stretch, but she struggles with this.                   PT Long Term Goals - 09/18/17  [place] #1   Title  Pt. will be independent in home exercise for shoulder ROM and strengthening.    Time  4    Period  Weeks    Status  New      PT LONG TERM GOAL #2   Title  Pt. will be able to demonstrate erect posture.    Time  4    Period  Weeks    Status  New      PT LONG TERM GOAL #3   Title  Pt. will report at least 40% decrease in left axilla-area pain.    Time  4    Period  Weeks    Status  New            Plan - 10/15/17 1008    Clinical Impression Statement  Did not have time to review supine scapular series with pt today as she reported she needed to leave early. Pt continues to demonstrate difficulty in relaxing with end P/ROM, even with constant VCs to do so. She reports has only done new HEP 1-2x since being here last. Educated pt that she needs to be more consistent with HEP, (at least just once/day of stretching) to see more consistent, ongoing results. Also discussed benefits of daily walking program with her, like 5 mins, 3x/day and building on that and keeping a log of it. She verbalized understanding this and seemed encouraged about trying smaller increments of time instead of just trying to go out for a long walk which she reports not feeling up to yet.     Rehab Potential  Fair    Clinical Impairments Affecting Rehab Potential  multiple musculoskeletal problems including arthritis and fibromyalgia    PT Frequency  2x / week    PT Duration  4 weeks    PT Treatment/Interventions  ADLs/Self Care Home Management;Electrical Stimulation;Therapeutic exercise;Patient/family education;Manual techniques;Manual  lymph drainage;Scar mobilization;Passive range of motion    PT Next Visit Plan  Cont P/AA/A/ROM to both shoulders with focus on left (which is the area of pain and is more limited); review supine scapular series to HEP; shoulder and scapular strengthening; posture re-ed; myofascial release techniques.    Consulted and Agree with Plan of Care  Patient       Patient will benefit from skilled therapeutic intervention in order to improve the following deficits and impairments:  Pain, Decreased range of motion, Decreased strength, Impaired UE functional use  Visit Diagnosis: Chronic left shoulder pain  Stiffness of left shoulder, not elsewhere classified  Stiffness of right shoulder, not elsewhere classified  Abnormal posture     Problem List Patient Active Problem List   Diagnosis Date Noted  . Lung nodule 09/15/2017  . Morbid obesity with BMI of 45.0-49.9, adult ([place]) 09/05/2017  . 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 Vertigo 10/12/2016  . Neuropathic pain 06/25/2016  . 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 S/P lumbar spinal fusion 01/27/2016  . H/O therapeutic radiation 09/21/2015  . Pes planus of both feet 08/04/2015  . Primary osteoarthritis of right knee 03/10/2015  . Osteoarthritis of right knee 02/04/2015  . Primary osteoarthritis of left knee 09/13/2014  . Hot flashes 01/19/2014  . Abdominal pain, unspecified site 01/19/2014  . Malignant neoplasm of upper-outer quadrant of left breast in female, estrogen receptor positive ([place]) 03/19/2013  . Lumbosacral spondylosis without myelopathy 12/30/2012  . Fibromyalgia syndrome 08/15/2012  . Peripheral neuropathy, toxic 08/15/2012  .  Lymphedema of arm - left 04/30/2012    Otelia Limes, PTA 10/15/2017, 10:15 AM  [place], [place], [address] Phone: [phone]   Fax:  [phone]  Name: Kelsey Wilson MRN: [id number] Date of Birth: 1961-02-12

## 2017-10-16 ENCOUNTER — Encounter: Payer: Self-pay | Admitting: Nurse Practitioner

## 2017-10-16 ENCOUNTER — Ambulatory Visit (INDEPENDENT_AMBULATORY_CARE_PROVIDER_SITE_OTHER): Payer: Medicare Other | Admitting: Nurse Practitioner

## 2017-10-16 VITALS — BP 112/68 | HR 74 | Ht 63.0 in | Wt 276.0 lb

## 2017-10-16 DIAGNOSIS — R112 Nausea with vomiting, unspecified: Secondary | ICD-10-CM | POA: Diagnosis not present

## 2017-10-16 DIAGNOSIS — R1084 Generalized abdominal pain: Secondary | ICD-10-CM | POA: Diagnosis not present

## 2017-10-16 DIAGNOSIS — G8929 Other chronic pain: Secondary | ICD-10-CM | POA: Diagnosis not present

## 2017-10-16 DIAGNOSIS — R109 Unspecified abdominal pain: Secondary | ICD-10-CM

## 2017-10-16 MED ORDER — PROMETHAZINE HCL 12.5 MG RE SUPP: 12.5000 mg | Freq: Two times a day (BID) | RECTAL | 0 refills | Status: DC | PRN

## 2017-10-16 NOTE — Progress Notes (Signed)
IMPRESSION and PLAN:     57 yo female with chronic abdominal pain, nausea/ vomiting. Normal EGD for sx same in late 2017. Sometimes pain feels like a muscle spasm.  Liver chemistries last month were normal. 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  Thought about trial of low dose Reglan but after discussing potential side effects and learning she takes Ingrezza for lip smacking (tartative dyskinesia) will avoid Reglan. She has Zofran but it doesn't help, ends of vomiting the pill.   She can't take Compazine. Anti-emetic options are limited.  SL Zofran one option but it can be expensive so will first try low dose Phenergan supp 12.5 mg BID as needed. Cautioned about sedation.       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 . She is status post cholecystectomy, appendectomy and hysterectomy. Patient is known to Dr. Loletha Carrow for history of GERD, diverticulosis, adenomatous colon polyps (March 2017) / chronic abdominal pain. PCP Dr. Alphonzo Grieve has re-referred her for LUQ pain and vomiting.  I saw her December 2017 for evaluation of upper abdominal pain.  Subsequent upper endoscopy was normal. She continues to have intermittent upper abdominal pain unrelated to meals. Sometimes feels like a muscles spasm as bending and twisting exacerbates the pain. Still continues to have intermittent vomiting of undigested food. If vomits in am upon waking up then emesis is usually just liquids. No associated weight loss, her weight is going up.She also complains of lower abdominal pain which wakes up her a night sometimes. This started in January. It is not relieved with defecation.   She has been researching sleeve gastrectomy.    Review of systems:     No  chest pain, no shortness of breath. No urinary sx  Past Medical History:  Diagnosis Date  . Anemia   . Anxiety   . Arthritis   . Breast cancer ([place]) 2010   T3N1 invasive ductal carcinoma left breast.Takes Arimidex daily  . Bursitis   . Carpal tunnel syndrome   . Chronic back pain    stenosis  . Constipation    takes Colace daily  . Depression    takes Benzotropine daily  . Diverticulitis of colon   . Dyspnea    daily when walking for over 1 yr.  . Fibromyalgia 08/2012  . GERD (gastroesophageal reflux disease)    takes Dexilant daily  . Hemorrhoid   . History of blood transfusion    no abnormal reaction noted  . History of colon polyps    benign  . History of shingles   . Joint pain   . Joint swelling   . Night muscle spasms    takes Flexeril nightly as needed  . Nocturia   . OSA (obstructive sleep apnea)   . OSA on CPAP   . Peripheral edema    takes Furosemide.Just started 01/18/16  . Peripheral neuropathy    takes Lyrica daily  . Personal history of chemotherapy    2013  . Personal history of radiation therapy    2013  . Pneumonia    hx of-2015  . Seasonal allergies    takes Singulair nightly  . SOB (shortness of breath) on exertion    rarely with exertion  . 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 Alphonzo Grieve)    Patient's surgical history, family medical history, social history, medications and allergies were all reviewed in Epic    Physical Exam:     BP 112/68   Pulse 74   Ht 5\' 3"  (1.6 m)   Wt 276 lb (125.2 kg)   BMI 48.89 kg/m   GENERAL:  Pleasant obese female in NAD PSYCH: : cooperative, normal affect EENT:  conjunctiva pink, mucous membranes moist, neck supple without masses CARDIAC:  RRR, no peripheral edema PULM: Normal respiratory effort, lungs CTA bilaterally, no wheezing ABDOMEN:  Nondistended, soft, nontender. No obvious  masses, no hepatomegaly,  normal bowel sounds SKIN:  turgor, no lesions seen Musculoskeletal:  Normal muscle tone, normal strength NEURO: Alert and oriented x 3, no focal neurologic deficits   Tye Savoy , NP 10/16/2017, 9:54 AM   Cc: Lillia Corporal, MD

## 2017-10-16 NOTE — Patient Instructions (Signed)
If you are age 59 or older, your body mass index should be between 23-30. Your Body mass index is 48.89 kg/m. If this is out of the aforementioned range listed, please consider follow up with your Primary Care Provider.  If you are age 12 or younger, your body mass index should be between 19-25. Your Body mass index is 48.89 kg/m. If this is out of the aformentioned range listed, please consider follow up with your Primary Care Provider.   We have sent the following medications to your pharmacy for you to pick up at your convenience: Phenergan Suppository  Follow up as needed.  Thank you for choosing me and [facility].   Tye Savoy, NP

## 2017-10-17 ENCOUNTER — Ambulatory Visit: Payer: Medicare Other

## 2017-10-19 ENCOUNTER — Encounter: Payer: Self-pay | Admitting: Nurse Practitioner

## 2017-10-21 ENCOUNTER — Other Ambulatory Visit: Payer: Self-pay | Admitting: Neurological Surgery

## 2017-10-21 ENCOUNTER — Telehealth: Payer: Self-pay | Admitting: Specialist

## 2017-10-21 DIAGNOSIS — M5416 Radiculopathy, lumbar region: Secondary | ICD-10-CM

## 2017-10-21 NOTE — Telephone Encounter (Signed)
Phone call to patient to verify medication list and allergies for myelogram procedure. Pt informed to stop phenergan and cymbalta 48hrs prior to appointment time. Pt verbalized understanding.

## 2017-10-21 NOTE — Progress Notes (Signed)
Thank you for sending this case to me. I have reviewed the entire note, and the outlined plan seems appropriate.  Chronic stable symptoms with probable delayed gastric emptying.  Cannot take metoclopramide. Dietary modifications and as-needed ondansetron.  Wilfrid Lund, MD

## 2017-10-22 ENCOUNTER — Encounter: Payer: Self-pay | Admitting: Registered Nurse

## 2017-10-22 ENCOUNTER — Encounter: Payer: Medicare Other | Attending: Physical Medicine & Rehabilitation | Admitting: Registered Nurse

## 2017-10-22 VITALS — BP 123/84 | HR 78 | Resp 14 | Ht 63.0 in | Wt 278.0 lb

## 2017-10-22 DIAGNOSIS — M797 Fibromyalgia: Secondary | ICD-10-CM | POA: Diagnosis not present

## 2017-10-22 DIAGNOSIS — M79641 Pain in right hand: Secondary | ICD-10-CM | POA: Diagnosis not present

## 2017-10-22 DIAGNOSIS — F329 Major depressive disorder, single episode, unspecified: Secondary | ICD-10-CM | POA: Insufficient documentation

## 2017-10-22 DIAGNOSIS — G8918 Other acute postprocedural pain: Secondary | ICD-10-CM | POA: Diagnosis not present

## 2017-10-22 DIAGNOSIS — M48062 Spinal stenosis, lumbar region with neurogenic claudication: Secondary | ICD-10-CM

## 2017-10-22 DIAGNOSIS — G894 Chronic pain syndrome: Secondary | ICD-10-CM

## 2017-10-22 DIAGNOSIS — Z9889 Other specified postprocedural states: Secondary | ICD-10-CM | POA: Diagnosis not present

## 2017-10-22 DIAGNOSIS — M79642 Pain in left hand: Secondary | ICD-10-CM

## 2017-10-22 DIAGNOSIS — Z853 Personal history of malignant neoplasm of breast: Secondary | ICD-10-CM | POA: Diagnosis not present

## 2017-10-22 DIAGNOSIS — M62838 Other muscle spasm: Secondary | ICD-10-CM

## 2017-10-22 DIAGNOSIS — G4733 Obstructive sleep apnea (adult) (pediatric): Secondary | ICD-10-CM | POA: Diagnosis not present

## 2017-10-22 DIAGNOSIS — G8929 Other chronic pain: Secondary | ICD-10-CM | POA: Diagnosis present

## 2017-10-22 DIAGNOSIS — M1711 Unilateral primary osteoarthritis, right knee: Secondary | ICD-10-CM

## 2017-10-22 DIAGNOSIS — M47817 Spondylosis without myelopathy or radiculopathy, lumbosacral region: Secondary | ICD-10-CM

## 2017-10-22 DIAGNOSIS — Z79899 Other long term (current) drug therapy: Secondary | ICD-10-CM

## 2017-10-22 DIAGNOSIS — M1712 Unilateral primary osteoarthritis, left knee: Secondary | ICD-10-CM

## 2017-10-22 DIAGNOSIS — K219 Gastro-esophageal reflux disease without esophagitis: Secondary | ICD-10-CM | POA: Diagnosis not present

## 2017-10-22 DIAGNOSIS — Z5181 Encounter for therapeutic drug level monitoring: Secondary | ICD-10-CM

## 2017-10-22 DIAGNOSIS — M545 Low back pain: Secondary | ICD-10-CM | POA: Diagnosis present

## 2017-10-22 DIAGNOSIS — M48061 Spinal stenosis, lumbar region without neurogenic claudication: Secondary | ICD-10-CM | POA: Diagnosis not present

## 2017-10-22 DIAGNOSIS — M5416 Radiculopathy, lumbar region: Secondary | ICD-10-CM | POA: Diagnosis not present

## 2017-10-22 MED ORDER — METHOCARBAMOL 500 MG PO TABS: 500.0000 mg | ORAL_TABLET | Freq: Two times a day (BID) | ORAL | 2 refills | Status: DC | PRN

## 2017-10-22 NOTE — Progress Notes (Signed)
Subjective:    Patient ID: Kelsey Wilson, female    DOB: 04/24/1961, 57 y.o.   MRN: [id number]  HPI: Kelsey Wilson is a 57 year old female who returns for follow up appointment for chronic pain and medication refill. She sates her pain is located in her bilateral hands, lower back radiating into her left lower extremity and bilateral knees. Also reports increase frequency of muscle spasm, we will prescribe Methocarbamol. She rates her pain 8. Her current exercise regime is walking, encouraged to increase activity as tolerated.  Kelsey Wilson Morphine Equivalent is 21.33 MME.  Last UDS was Performed on 08/12/2017, it was consistent.    Pain Inventory Average Pain 9 Pain Right Now 8 My pain is aching  In the last 24 hours, has pain interfered with the following? General activity 4 Relation with others 4 Enjoyment of life 3 What TIME of day is your pain at its worst? all Sleep (in general) Poor  Pain is worse with: walking, bending and standing Pain improves with: medication Relief from Meds: 4  Mobility walk without assistance walk with assistance use a cane do you drive?  yes  Function I need assistance with the following:  meal prep, household duties and shopping  Neuro/Psych numbness tingling spasms depression anxiety  Prior Studies Any changes since last visit?  no  Physicians involved in your care Any changes since last visit?  no   Family History  Problem Relation Age of Onset  . Hypertension Mother   . Diabetes Mother   . Hypertension Father   . Diabetes Father   . Cancer Paternal Grandmother        unknown  . Colon cancer Neg Hx    Social History   Socioeconomic History  . Marital status: Single    Spouse name: Not on file  . Number of children: 3  . Years of education: Not on file  . Highest education level: Not on file  Occupational History  . Not on file  Social Needs  . Financial resource strain: Not on file  . Food insecurity:      Worry: Not on file    Inability: Not on file  . Transportation needs:    Medical: Not on file    Non-medical: Not on file  Tobacco Use  . Smoking status: Never Smoker  . Smokeless tobacco: Never Used  Substance and Sexual Activity  . Alcohol use: No    Alcohol/week: 0.0 oz  . Drug use: No  . Sexual activity: Never    Birth control/protection: Post-menopausal, Surgical  Lifestyle  . Physical activity:    Days per week: Not on file    Minutes per session: Not on file  . Stress: Not on file  Relationships  . Social connections:    Talks on phone: Not on file    Gets together: Not on file    Attends religious service: Not on file    Active member of club or organization: Not on file    Attends meetings of clubs or organizations: Not on file    Relationship status: Not on file  Other Topics Concern  . Not on file  Social History Narrative  . Not on file   Past Surgical History:  Procedure Laterality Date  . ABDOMINAL HYSTERECTOMY     still has ovaries  . APPENDECTOMY    . AXILLARY LYMPH NODE DISSECTION  11/28/2011   Procedure: AXILLARY LYMPH NODE DISSECTION;  Surgeon: Edward Jolly,  MD;  Location: [facility];  Service: General;  Laterality: Left;  . BREAST LUMPECTOMY Left 11/28/2011   Malignant  . BREAST SURGERY Left 2013  . CARPAL TUNNEL RELEASE     Bilateral  . CESAREAN SECTION     pt. has had 3  . CHOLECYSTECTOMY    . COLONOSCOPY    . EYE SURGERY Bilateral    cataract removal  . KNEE SURGERY     Left Knee  . LAMINECTOMY WITH POSTERIOR LATERAL ARTHRODESIS LEVEL 2 N/A 06/07/2017   Procedure: Posterior Lateral Fusion Lumbar Three-Four and Transforaminal Interbody Fusion Lumbar Four-Five with Segmental  Pedicle Screw Fixation;  Surgeon: Eustace Moore, MD;  Location: [facility];  Service: Neurosurgery;  Laterality: N/A;  Posterior Lateral Fusion Lumbar Three-Four and Transforaminal Interbody Fusion Lumbar Four-Five with Segmental  Pedicle Screw Fixation   . LUMBAR FUSION   06/07/2017   POST  . LUMBAR LAMINECTOMY/DECOMPRESSION MICRODISCECTOMY Bilateral 01/27/2016   Procedure: Laminectomy and Foraminotomy - Lumbar four -Lumbar five - bilateral- on-lay noninstrumented fusion;  Surgeon: Eustace Moore, MD;  Location: [facility];  Service: Neurosurgery;  Laterality: Bilateral;  . MULTIPLE EXTRACTIONS WITH ALVEOLOPLASTY N/A 05/11/2016   Procedure: EXTRACTION OF TEETH EIGHTEEN, TWENTY AND TWENTY- NINE;  REMOVAL OF MANDIBULAR TORUS AND EXOSTOSIS;  Surgeon: Diona Browner, DDS;  Location: [facility];  Service: Oral Surgery;  Laterality: N/A;  . PORTACATH PLACEMENT  06/18/2011   Procedure: INSERTION PORT-A-CATH;  Surgeon: Edward Jolly, MD;  Location: [facility];  Service: General;  Laterality: Right;  right subclavian  . removal portacath  2014  . spur     Apex spur on both big toes  . TOE SURGERY Bilateral   . TONSILLECTOMY    . TOTAL KNEE ARTHROPLASTY Left 09/13/2014  . TOTAL KNEE ARTHROPLASTY Left 09/13/2014   Procedure: LEFT TOTAL KNEE ARTHROPLASTY;  Surgeon: Rod Can, MD;  Location: [facility];  Service: Orthopedics;  Laterality: Left;  . TOTAL KNEE ARTHROPLASTY Right 03/10/2015   Procedure: RIGHT TOTAL KNEE ARTHROPLASTY;  Surgeon: Rod Can, MD;  Location: [facility];  Service: Orthopedics;  Laterality: Right;   Past Medical History:  Diagnosis Date  . Anemia   . Anxiety   . Arthritis   . Breast cancer ([place]) 2010   T3N1 invasive ductal carcinoma left breast.Takes Arimidex daily  . Bursitis   . Carpal tunnel syndrome   . Chronic back pain    stenosis  . Constipation    takes Colace daily  . Depression    takes Benzotropine daily  . Diverticulitis of colon   . Dyspnea    daily when walking for over 1 yr.  . Fibromyalgia 08/2012  . GERD (gastroesophageal reflux disease)    takes Dexilant daily  . Hemorrhoid   . History of blood transfusion    no abnormal reaction noted  . History of colon polyps    benign  . History of shingles   .  Joint pain   . Joint swelling   . Night muscle spasms    takes Flexeril nightly as needed  . Nocturia   . OSA (obstructive sleep apnea)   . OSA on CPAP   . Peripheral edema    takes Furosemide.Just started 01/18/16  . Peripheral neuropathy    takes Lyrica daily  . Personal history of chemotherapy    2013  . Personal history of radiation therapy    2013  . Pneumonia    hx of-2015  . Seasonal allergies  takes Singulair nightly  . SOB (shortness of breath) on exertion    rarely with exertion  . 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 Alphonzo Grieve)   BP 123/84 (BP Location: Right Arm, Patient Position: Sitting, Cuff Size: Large)   Pulse 78   Resp 14   Ht 5\' 3"  (1.6 m)   Wt 278 lb (126.1 kg)   SpO2 96%   BMI 49.25 kg/m   Opioid Risk Score:   Fall Risk Score:  `1  Depression screen PHQ 2/9  Depression screen [facility] 2/9 06/21/2017 03/07/2017 08/23/2015 04/26/2015 02/04/2015  Decreased Interest 1 2 2  0 0  Down, Depressed, Hopeless 1 2 0 0 0  PHQ - 2 Score 2 4 2  0 0  Altered sleeping - 0 1 - -  Tired, decreased energy - 3 3 - -  Change in appetite - 3 3 - -  Feeling bad or failure about yourself  - 2 0 - -  Trouble concentrating - 3 3 - -  Moving slowly or fidgety/restless - 3 2 - -  Suicidal thoughts - 0 0 - -  PHQ-9 Score - 18 14 - -  Difficult doing work/chores - Extremely dIfficult Very difficult - -  Some recent data might be hidden    Review of Systems  Constitutional: Positive for diaphoresis and unexpected weight change.  HENT: Negative.   Eyes: Negative.   Respiratory: Negative.   Cardiovascular: Negative.   Gastrointestinal: Negative.   Endocrine: Negative.   Genitourinary: Negative.   Musculoskeletal: Positive for arthralgias, back pain, gait problem and myalgias.       Spasms   Allergic/Immunologic: Negative.   Neurological: Positive for  numbness.       Tingling   Hematological: Negative.   Psychiatric/Behavioral: Positive for dysphoric mood. The patient is nervous/anxious.        Objective:   Physical Exam  Constitutional: She is oriented to person, place, and time. She appears well-developed and well-nourished.  HENT:  Head: Normocephalic and atraumatic.  Neck: Normal range of motion. Neck supple.  Cardiovascular: Normal rate and regular rhythm.  Pulmonary/Chest: Effort normal and breath sounds normal.  Musculoskeletal:  Normal Muscle Bulk and Muscle Testing Reveals: Upper Extremities: Full ROM and Muscle Strength 5/5 Bilateral AC Joint Tenderness Lumbar Paraspinal Tenderness: L-3-L-5 Lower Extremities: Full ROM and Muscle Strength 5/5 Arises from Table slowly Narrow Based Gait  Neurological: She is alert and oriented to person, place, and time.  Skin: Skin is warm and dry.  Psychiatric: She has a normal mood and affect.  Nursing note and vitals reviewed.         Assessment & Plan:  1. Lumbar Postlaminectomy/ S/P Lumbar Fusion:Spinal Stenosis Continue HEP as Tolerated and Continue current medication regime. Hydrocodone 5/325 mg one tablet every 6 hours as needed. No script given today. 10/22/2017  2. Lumbar Radiculitis: Continue current medication regime with Lyrica. 10/22/2017 3. Fibromyalgia: Encouraged to Increase HEP as Tolerated. Continue Current Medication Regimen with Lyrica. 10/22/2017 4. Bilateral Greater Trochanter Bursitis: No complaints today. Also reports she had a cortisone injection at her Neurosurgeon office. Continue to Alternate Ice and Heat Therapy. 10/22/2017. 5. Muscle Spasm: Refilled: Robaxin.Continue to Monitor. 10/22/2017. 6. Chronic Pain Syndrome: Continue Current medication regimen and HEP as Tolerated. Continue to Monitor. 10/22/2017. 7. Bilateral OA of Bilateral Knees: Also reports she has Voltaren Gel at home, she uses it sparingly due to  GI upset.  8. Bilateral Hand Pain:  Continue to Monitor/ ? OA: Continue to Monitor.   20 minutes of Face to Face Patient Care time was spent during this visit. All questions encouraged and answered,   F/U in 1 month

## 2017-10-24 ENCOUNTER — Ambulatory Visit: Payer: Medicare Other

## 2017-10-24 DIAGNOSIS — G8929 Other chronic pain: Secondary | ICD-10-CM

## 2017-10-24 DIAGNOSIS — M25512 Pain in left shoulder: Secondary | ICD-10-CM | POA: Diagnosis not present

## 2017-10-24 DIAGNOSIS — R293 Abnormal posture: Secondary | ICD-10-CM

## 2017-10-24 DIAGNOSIS — M25612 Stiffness of left shoulder, not elsewhere classified: Secondary | ICD-10-CM

## 2017-10-24 DIAGNOSIS — M25611 Stiffness of right shoulder, not elsewhere classified: Secondary | ICD-10-CM

## 2017-10-24 MED FILL — ANASTROZOLE 1 MG TABLET: 1 | 30 days supply | Qty: 30 | Fill #1

## 2017-10-24 NOTE — Therapy (Signed)
[place], [place], [address] Phone: [phone]   Fax:  [phone]  Physical Therapy Treatment  Patient Details  Name: Kelsey Wilson MRN: [id number] Date of Birth: 02-24-1961 Referring Provider: Dr. Lurline Del   Encounter Date: 10/24/2017  PT End of Session - 10/24/17 1016    Visit Number  6    Number of Visits  9    Date for PT Re-Evaluation  11/01/17    PT Start Time  0936    PT Stop Time  1018    PT Time Calculation (min)  42 min    Activity Tolerance  Patient tolerated treatment well    Behavior During Therapy  [facility] for tasks assessed/performed       Past Medical History:  Diagnosis Date  . Anemia   . Anxiety   . Arthritis   . Breast cancer ([place]) 2010   T3N1 invasive ductal carcinoma left breast.Takes Arimidex daily  . Bursitis   . Carpal tunnel syndrome   . Chronic back pain    stenosis  . Constipation    takes Colace daily  . Depression    takes Benzotropine daily  . Diverticulitis of colon   . Dyspnea    daily when walking for over 1 yr.  . Fibromyalgia 08/2012  . GERD (gastroesophageal reflux disease)    takes Dexilant daily  . Hemorrhoid   . History of blood transfusion    no abnormal reaction noted  . History of colon polyps    benign  . History of shingles   . Joint pain   . Joint swelling   . Night muscle spasms    takes Flexeril nightly as needed  . Nocturia   . OSA (obstructive sleep apnea)   . OSA on CPAP   . Peripheral edema    takes Furosemide.Just started 01/18/16  . Peripheral neuropathy    takes Lyrica daily  . Personal history of chemotherapy    2013  . Personal history of radiation therapy    2013  . Pneumonia    hx of-2015  . Seasonal allergies    takes Singulair nightly  . SOB (shortness of breath) on exertion    rarely with exertion  . 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 Alphonzo Grieve)    Past Surgical History:  Procedure Laterality Date  . ABDOMINAL HYSTERECTOMY     still has ovaries  . APPENDECTOMY    . AXILLARY LYMPH NODE DISSECTION  11/28/2011   Procedure: AXILLARY LYMPH NODE DISSECTION;  Surgeon: Edward Jolly, MD;  Location: [facility];  Service: General;  Laterality: Left;  . BREAST LUMPECTOMY Left 11/28/2011   Malignant  . BREAST SURGERY Left 2013  . CARPAL TUNNEL RELEASE     Bilateral  . CESAREAN SECTION     pt. has had 3  . CHOLECYSTECTOMY    . COLONOSCOPY    . EYE SURGERY Bilateral    cataract removal  . KNEE SURGERY     Left Knee  . LAMINECTOMY WITH POSTERIOR LATERAL ARTHRODESIS LEVEL 2 N/A 06/07/2017   Procedure: Posterior Lateral Fusion Lumbar Three-Four and Transforaminal Interbody Fusion Lumbar Four-Five with Segmental  Pedicle Screw Fixation;  Surgeon: Eustace Moore, MD;  Location: [facility];  Service: Neurosurgery;  Laterality: N/A;  Posterior Lateral Fusion Lumbar Three-Four and Transforaminal Interbody Fusion Lumbar Four-Five with Segmental  Pedicle Screw Fixation   . LUMBAR FUSION  06/07/2017   POST  . LUMBAR LAMINECTOMY/DECOMPRESSION MICRODISCECTOMY Bilateral 01/27/2016   Procedure: Laminectomy and Foraminotomy - Lumbar four -Lumbar five - bilateral- on-lay noninstrumented fusion;  Surgeon: Eustace Moore, MD;  Location: [facility];  Service: Neurosurgery;  Laterality: Bilateral;  . MULTIPLE EXTRACTIONS WITH ALVEOLOPLASTY N/A 05/11/2016   Procedure: EXTRACTION OF TEETH EIGHTEEN, TWENTY AND TWENTY- NINE;  REMOVAL OF MANDIBULAR TORUS AND EXOSTOSIS;  Surgeon: Diona Browner, DDS;  Location: [facility];  Service: Oral Surgery;  Laterality: N/A;  . PORTACATH PLACEMENT  06/18/2011   Procedure: INSERTION PORT-A-CATH;  Surgeon: Edward Jolly, MD;  Location: [facility];  Service: General;  Laterality: Right;  right subclavian  . removal portacath  2014  . spur     Apex  spur on both big toes  . TOE SURGERY Bilateral   . TONSILLECTOMY    . TOTAL KNEE ARTHROPLASTY Left 09/13/2014  . TOTAL KNEE ARTHROPLASTY Left 09/13/2014   Procedure: LEFT TOTAL KNEE ARTHROPLASTY;  Surgeon: Rod Can, MD;  Location: [facility];  Service: Orthopedics;  Laterality: Left;  . TOTAL KNEE ARTHROPLASTY Right 03/10/2015   Procedure: RIGHT TOTAL KNEE ARTHROPLASTY;  Surgeon: Rod Can, MD;  Location[facility];  Service: Orthopedics;  Laterality: Right;    There were no vitals filed for this visit.  Subjective Assessment - 10/24/17 0939    Subjective  I've been doing my HEP with the bands some. I try to get through them all but sometimes I fall asleep before I can finish.      Pertinent History  Left breast cancer diagnosed 2012, neo-adjuvant chemo completed May 2013, left lumpectomy with ALND June 2013, and radiation treatment completed October 2013; has been on anastrozole since then. Recent CT scan showed no cancer to explain left arm pain; she does have two small lung nodules that are being watched.  She has been treated here for lymphedema some years ago. Fibromyalgia; neuropathy in hands and feet with pain, numbness, and tingling; back problems with surgery 06/07/17 for lumbar fusion and she reports it still hurts a lot; sleep apnea; diverticulitis; arthritis in back and "everywhere"; h/o total knee replacements bilat. in around 2015    Patient Stated Goals  get rid of this discomfort/soreness    Currently in Pain?  Yes    Pain Score  5     Pain Location  Axilla    Pain Orientation  Left    Pain Descriptors / Indicators  Dull    Pain Type  Surgical pain    Pain Onset  More than a month ago    Pain Frequency  Intermittent    Aggravating Factors   comes and goes    Pain Relieving Factors  stretching and moving         [facility] PT Assessment - 10/24/17 0001      AROM   Left Shoulder Flexion  136 Degrees after stretching    Left Shoulder ABduction  124 Degrees after stretching                    [facility] Adult PT Treatment/Exercise - 10/24/17 0001      Shoulder Exercises: Supine   Horizontal ABduction  Strengthening;Both;10 reps;Theraband    Theraband Level (Shoulder Horizontal ABduction)  Level 2 (Red)    External Rotation  Strengthening;Both;10 reps;Theraband    Theraband Level (Shoulder External Rotation)  Level 2 (Red)    Flexion  Strengthening;Both;10 reps;Theraband  Narrow and Wide Grip, 10 times each    Theraband Level (Shoulder Flexion)  Level 2 (Red)    Other Supine Exercises  Lt (Rt thumb/finger became painful pt repeorts from arthritis) D2 with red theraband 10 times each; for this and above supine scapular exercises pt returned therpapist dmeonstration for all and VCs given for encouragement of full ROM.      Manual Therapy   Passive ROM  In supine to left shoulder into er, abduction, and flexion; was able to get to pts end ROM continuing with slow, progressive stretching giving pt time to relax into stretch, but she struggles with this.                   PT Long Term Goals - 10/24/17 1017      PT LONG TERM GOAL #1   Title  Pt. will be independent in home exercise for shoulder ROM and strengthening.    Baseline  Pt is progressing well towards this, she reports just needs to be more consistent with this-10/24/17    Status  Partially Met      PT LONG TERM GOAL #2   Title  Pt. will be able to demonstrate erect posture.    Status  Partially Met      PT LONG TERM GOAL #3   Title  Pt. will report at least 40% decrease in left axilla-area pain.    Baseline  50% improvement with pain at this time-10/24/17    Status  Achieved            Plan - 10/24/17 1020    Clinical Impression Statement  Reviewed supine scapular series with pt today with red theraband. She tolerates this well just fatigues quickly with UE's. Overall sh is doing very well reporting 50% improvement of Lt axilla/UE pain meeting that goal. Also her A/ROM has  improved since evaluation. Pt will benefit from returning for 1-2 more visits to finalize HEP and focus on end ROM stretching.    Rehab Potential  Fair    Clinical Impairments Affecting Rehab Potential  multiple musculoskeletal problems including arthritis and fibromyalgia    PT Frequency  2x / week    PT Duration  4 weeks    PT Treatment/Interventions  ADLs/Self Care Home Management;Electrical Stimulation;Therapeutic exercise;Patient/family education;Manual techniques;Manual lymph drainage;Scar mobilization;Passive range of motion    PT Next Visit Plan  Focus on finalizing HEP and end ROM stretching of Lt shoulder.     Consulted and Agree with Plan of Care  Patient       Patient will benefit from skilled therapeutic intervention in order to improve the following deficits and impairments:  Pain, Decreased range of motion, Decreased strength, Impaired UE functional use  Visit Diagnosis: Chronic left shoulder pain  Stiffness of left shoulder, not elsewhere classified  Stiffness of right shoulder, not elsewhere classified  Abnormal posture     Problem List Patient Active Problem List   Diagnosis Date Noted  . Lung nodule 09/15/2017  . Morbid obesity with BMI of 45.0-49.9, adult ([place]) 09/05/2017  . 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 Vertigo 10/12/2016  . Neuropathic pain 06/25/2016  . 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 S/P lumbar spinal fusion 01/27/2016  . H/O therapeutic radiation 09/21/2015  . Pes planus of both feet 08/04/2015  . Primary osteoarthritis of right knee 03/10/2015  . Osteoarthritis of right knee 02/04/2015  . Primary osteoarthritis of left knee 09/13/2014  . Hot flashes 01/19/2014  .  Abdominal pain, unspecified site 01/19/2014  . Malignant neoplasm of upper-outer quadrant of left breast in female, estrogen receptor positive ([place]) 03/19/2013  . Lumbosacral spondylosis without myelopathy 12/30/2012  . Fibromyalgia  syndrome 08/15/2012  . Peripheral neuropathy, toxic 08/15/2012  . Lymphedema of arm - left 04/30/2012    Otelia Limes, PTA 10/24/2017, 10:22 AM  [place], [place], [address] Phone: [phone]   Fax:  [phone]  Name: Kelsey Wilson MRN: [id number] Date of Birth: [birth year]

## 2017-10-30 ENCOUNTER — Ambulatory Visit
Admission: RE | Admit: 2017-10-30 | Discharge: 2017-10-30 | Disposition: A | Discharge status: Home / Self Care | Payer: Medicare Other | Source: Ambulatory Visit | Attending: Neurological Surgery | Admitting: Neurological Surgery

## 2017-10-30 ENCOUNTER — Encounter

## 2017-10-30 VITALS — BP 96/54 | HR 85

## 2017-10-30 DIAGNOSIS — Z981 Arthrodesis status: Secondary | ICD-10-CM

## 2017-10-30 DIAGNOSIS — M5416 Radiculopathy, lumbar region: Secondary | ICD-10-CM

## 2017-10-30 DIAGNOSIS — M48062 Spinal stenosis, lumbar region with neurogenic claudication: Secondary | ICD-10-CM

## 2017-10-30 DIAGNOSIS — M47817 Spondylosis without myelopathy or radiculopathy, lumbosacral region: Secondary | ICD-10-CM

## 2017-10-30 IMAGING — XA DG MYELOGRAPHY LUMBAR INJ LUMBOSACRAL
13 of 22 series · 13 of 22 positions shown · non-contrast
Comparison: Preoperative myelogram 03/05/2017.

CLINICAL DATA: Low back pain. BILATERAL leg pain. Previous lumbar
fusion.
TECHNIQUE: Contiguous axial images were obtained through the Lumbar spine after
the intrathecal infusion of infusion. Coronal and sagittal
reconstructions were obtained of the axial image sets.

[Series 1: w lumbar spine ap · 0.15mm/px · 1 of 1 slices shown]
[im 1/1]
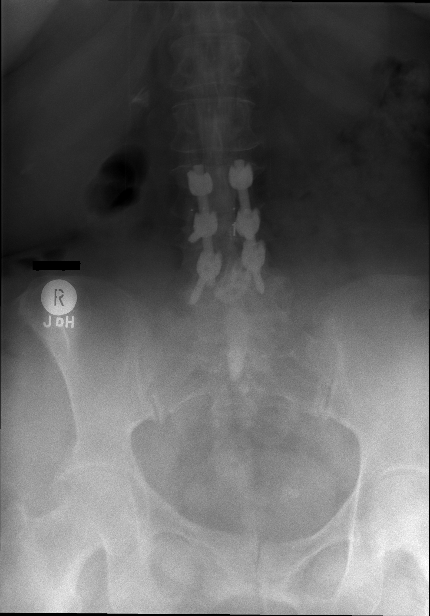

[Series 2: vasc adipose · 1 of 1 slices shown (1 of 9)]
[im 1/1]
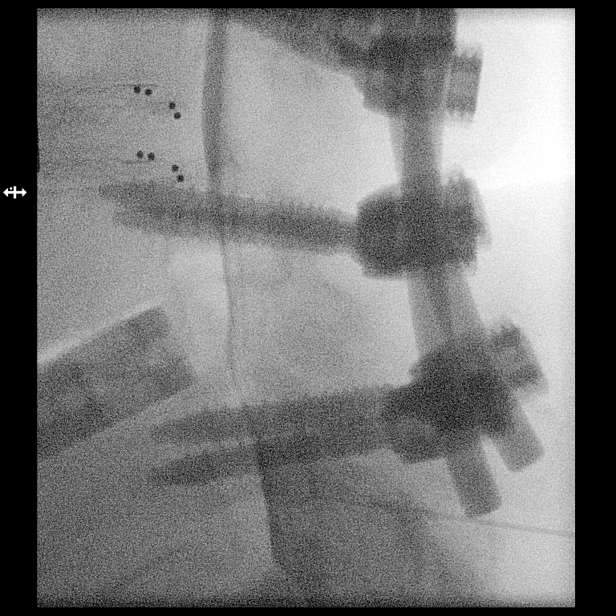

[Series 3: vasc adipose · 1 of 1 slices shown (2 of 9)]
[im 1/1]
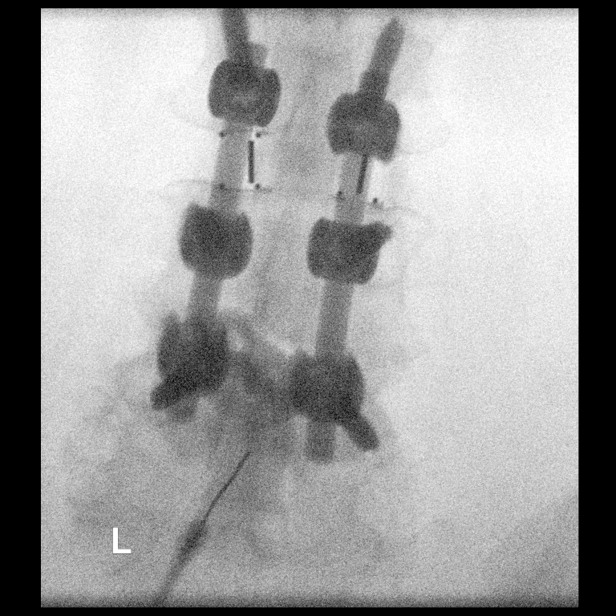

[Series 3: w lumbar spine lat · 0.15mm/px · 1 of 1 slices shown]
[im 1/1]
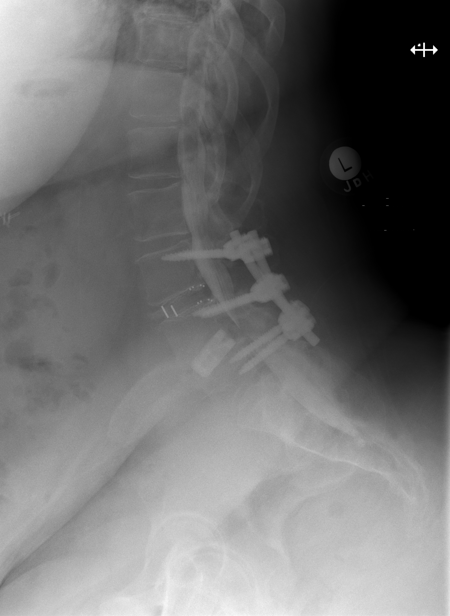

[Series 4: w lumbar spine flexion · 0.15mm/px · 1 of 1 slices shown]
[im 1/1]
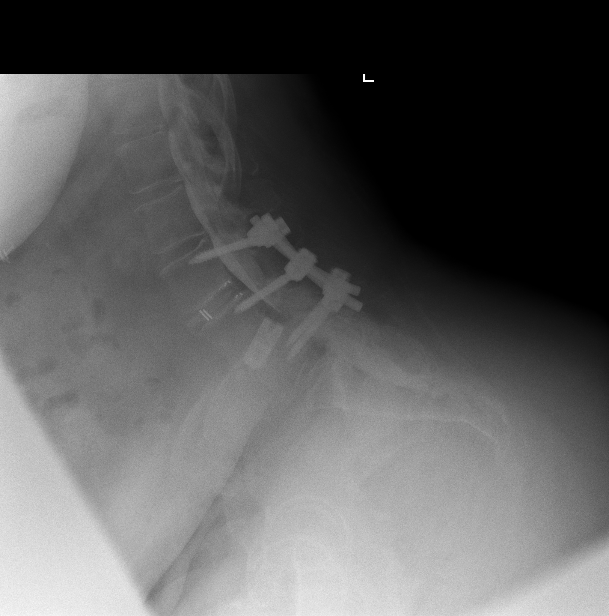

[Series 5: w lumbar spine extension · 0.15mm/px · 1 of 1 slices shown]
[im 1/1]
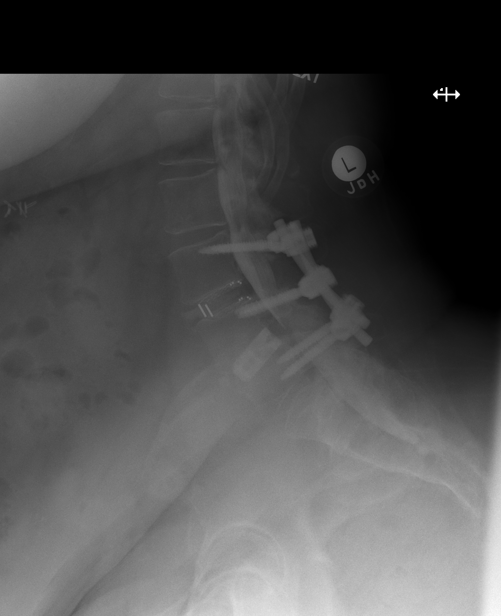

[Series 7: vasc adipose · 1 of 1 slices shown (3 of 9)]
[im 1/1]
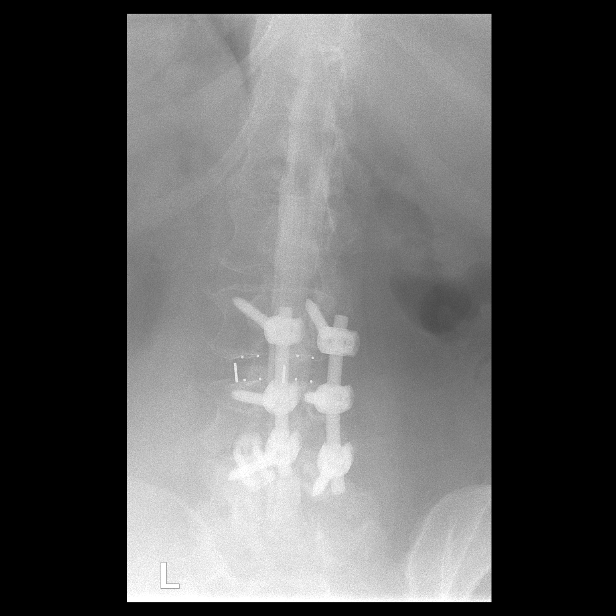

[Series 8: vasc adipose · 1 of 1 slices shown (4 of 9)]
[im 1/1]
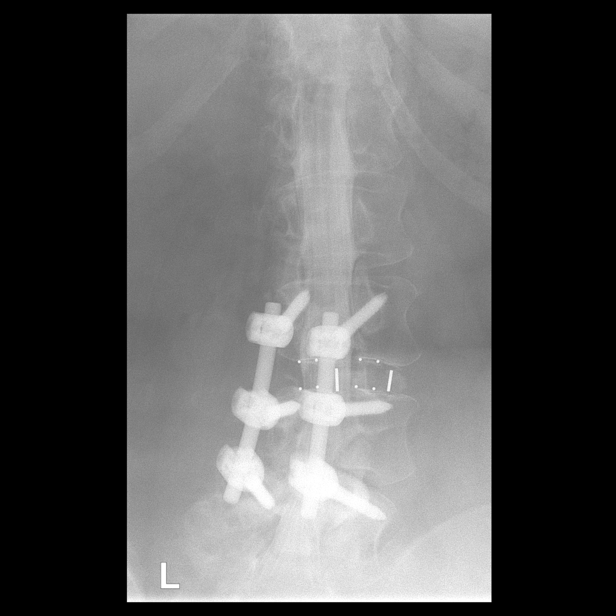

[Series 10: vasc adipose · 1 of 1 slices shown (5 of 9)]
[im 1/1]
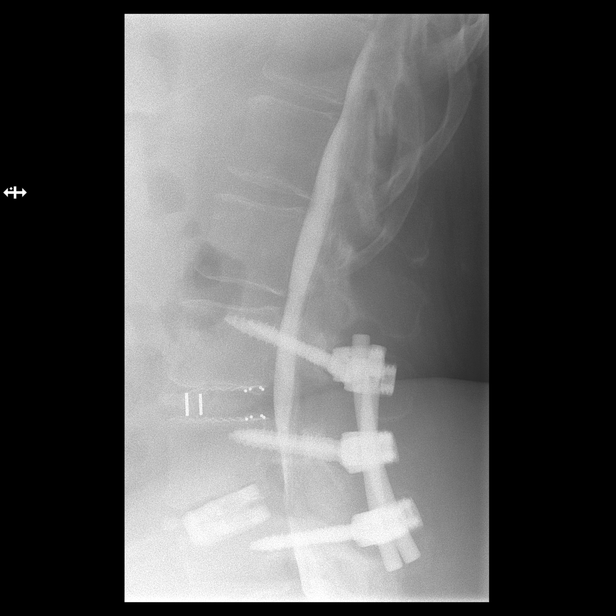

[Series 12: vasc adipose · 1 of 1 slices shown (6 of 9)]
[im 1/1]
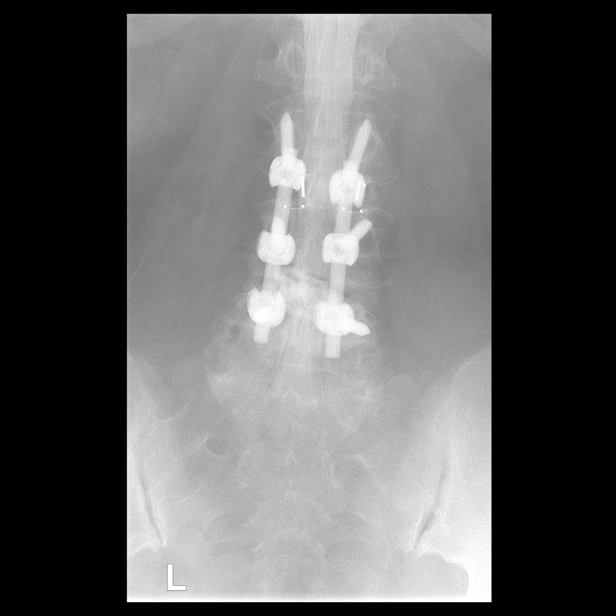

[Series 13: vasc adipose · 1 of 1 slices shown (7 of 9)]
[im 1/1]
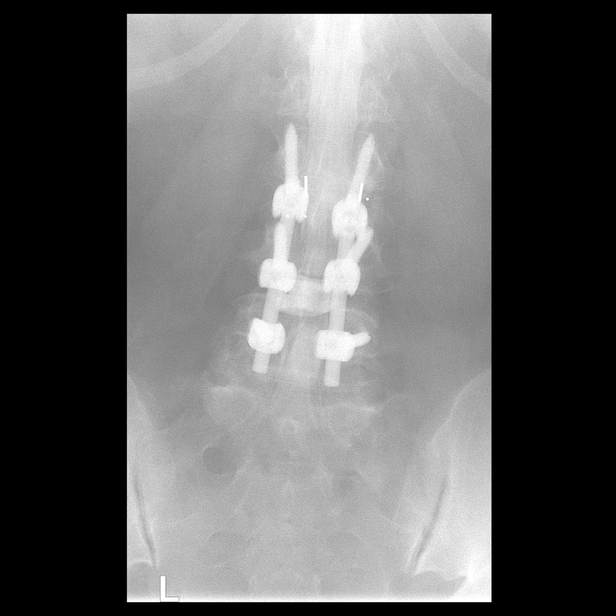

[Series 15: vasc adipose · 1 of 1 slices shown (8 of 9)]
[im 1/1]
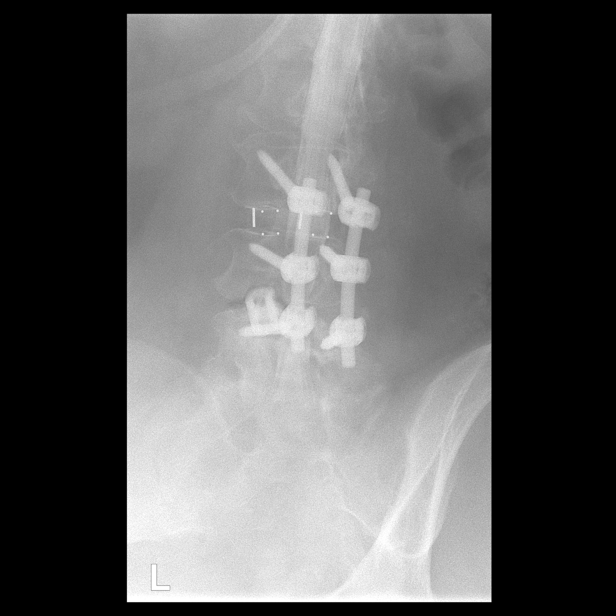

[Series 17: vasc adipose · 1 of 1 slices shown (9 of 9)]
[im 1/1]
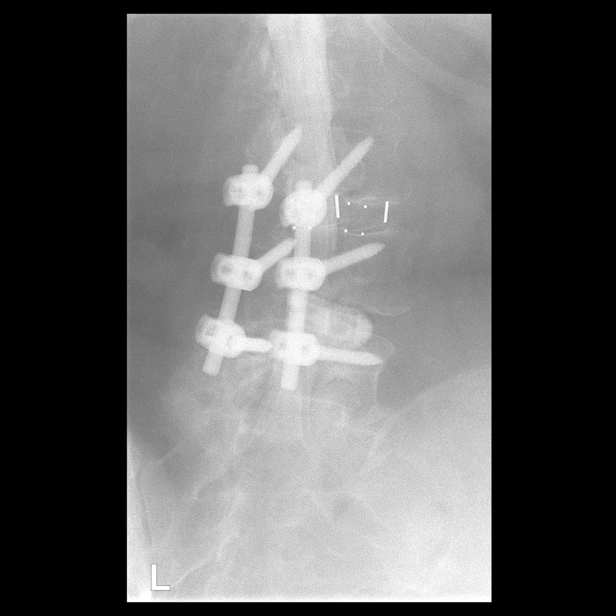

[13 of 22 positions shown; findings below may reference images not displayed]

EXAM:
LUMBAR MYELOGRAM

FLUOROSCOPY TIME:  1 minutes and 21 seconds corresponding to a Dose
Area Product of 550.22 Gy*m2

PROCEDURE:
After thorough discussion of risks and benefits of the procedure
including bleeding, infection, injury to nerves, blood vessels,
adjacent structures as well as headache and CSF leak, written and
oral informed consent was obtained. Consent was obtained by Dr. Mhargie
Agyeman. Time out form was completed.

Patient was positioned prone on the fluoroscopy table. Local
anesthesia was provided with 1% lidocaine without epinephrine after
prepped and draped in the usual sterile fashion. Puncture was
performed at L5-S1 using a 3 1/2 inch 22-gauge spinal needle via
midline approach. Using a single pass through the dura, the needle
was placed within the thecal sac, with return of clear CSF. 15 mL of
Isovue P-FHH was injected into the thecal sac, with normal
opacification of the nerve roots and cauda equina consistent with
free flow within the subarachnoid space.

I personally performed the lumbar puncture and administered the
intrathecal contrast. I also personally supervised acquisition of
the myelogram images.
FINDINGS: LUMBAR MYELOGRAM FINDINGS:

Good opacification lumbar subarachnoid space. Previous L3-L5 PLIF. 2
mm anterolisthesis L4 on L5, unchanged through standing flexion
extension. Moderate to severe stenosis L4-5, with BILATERAL L5 nerve
root impingement. Severe posterior element hypertrophy results in
dorsal impingement at the L4-5 level.

There is 2 mm anterolisthesis at L4-5 post fusion. This is unchanged
through standing flexion extension. There is 4 mm anterolisthesis
L5-S1, adjacent segment. This does not worsen between prone and
upright myelographic films.

CT LUMBAR MYELOGRAM FINDINGS:

Segmentation: Normal.

Alignment: 2 mm anterolisthesis L4-5. 3 mm anterolisthesis L5-S1.
Otherwise anatomic.

Vertebrae: No worrisome osseous lesion.

Conus medullaris: Normal in size, ending at upper L2.

Paraspinal tissues: No hydronephrosis or paravertebral mass.

Disc levels:

L1-L2: Mild facet arthropathy. Unremarkable disc space. No
impingement.

L2-L3: Mild facet arthropathy. Unremarkable disc space. No
impingement.

L3-L4: Partial interbody arthrodesis. The LEFT and RIGHT cages
appear to be incorporated within the bone, but there is bone graft
material centrally which is floating. Continued surveillance may be
warranted. No definite impingement.

L4-L5: Pseudarthrosis. 2 mm anterolisthesis. Subsidence of the cage
at L4-5. Vacuum phenomenon. BILATERAL L5 screw loosening. Severe
stenosis due to posterior element hypertrophy, predominantly facet
overgrowth. There is compression of the thecal sac and both L5 nerve
roots, worse on the RIGHT. BILATERAL foraminal narrowing is also
observed.

L5-S1: 3 mm anterolisthesis. Good disc height. Annular bulge. Facet
arthropathy, with posterior arthrodesis across the facets.. No
definite impingement.
IMPRESSION: LUMBAR MYELOGRAM IMPRESSION:

Status post L3-L5 fusion. Residual severe stenosis at L4-5. 2 mm
anterolisthesis L4-5 and 4 mm anterolisthesis L5-S1 are stable
through standing flexion extension.

CT LUMBAR MYELOGRAM IMPRESSION:

L4-5 pseudarthrosis. Vacuum phenomenon with subsidence of the single
interbody cage. BILATERAL L5 screw loosening. 2 mm anterolisthesis.
Severe stenosis due to posterior element hypertrophy, predominantly
facet overgrowth. BILATERAL L4 and L5 nerve root impingement.

Developing interbody arthrodesis L3-L4. The cages appear well seated
within the endplates. There is some free-floating bone graft
material centrally which may require continued surveillance.

3 mm anterolisthesis L5-S1 with good disc height but annular bulge
and facet arthropathy. Posterior arthrodesis. No definite
impingement.

## 2017-10-30 IMAGING — CT CT L SPINE W/ CM
1 of 7 series · 5 of 14 positions shown, 7 images · non-contrast
Comparison: Preoperative myelogram 03/05/2017.

CLINICAL DATA: Low back pain. BILATERAL leg pain. Previous lumbar
fusion.
TECHNIQUE: Contiguous axial images were obtained through the Lumbar spine after
the intrathecal infusion of infusion. Coronal and sagittal
reconstructions were obtained of the axial image sets.

[Series 4: l spine soft · axial · 0.36mm/px · z∈[-239,-104]mm · 5 of 69 slices shown, 7 images]
[im 12/69  soft-tissue]
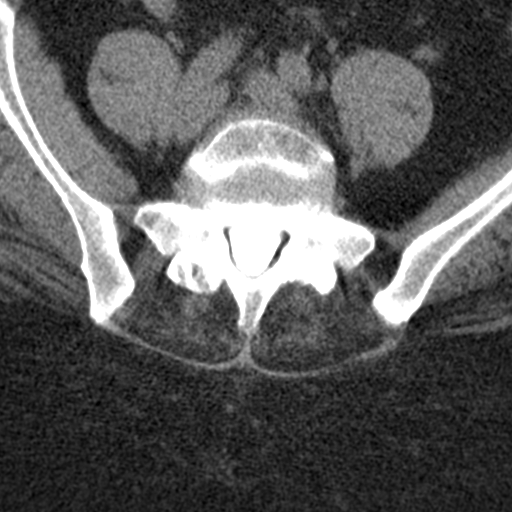
[im 12/69  bone]
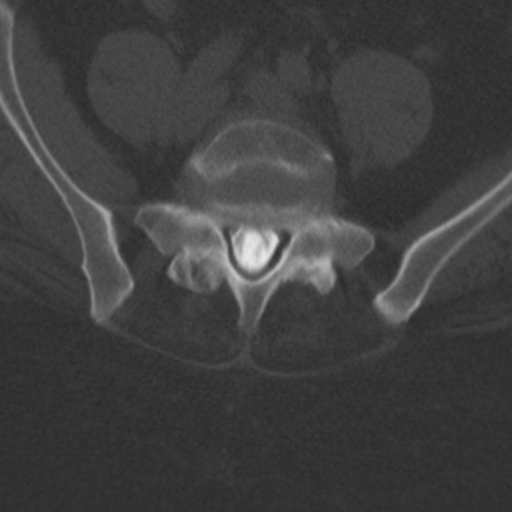
[im 23/69  bone]
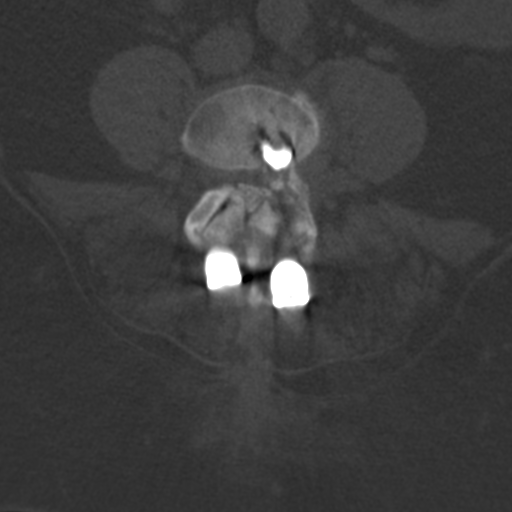
[im 35/69  bone]
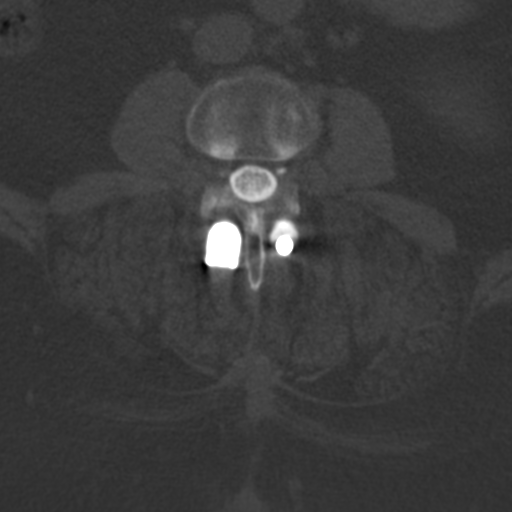
[im 46/69  bone]
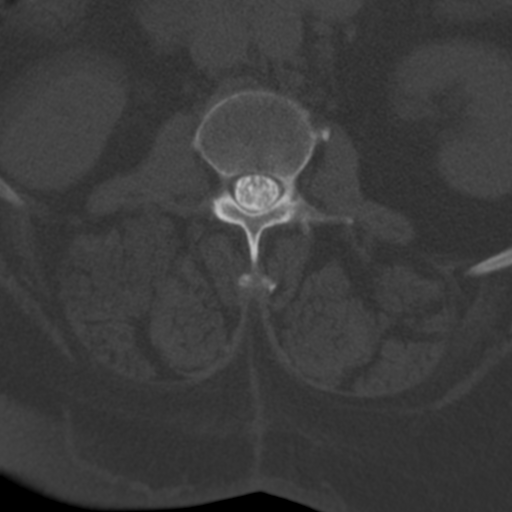
[im 57/69  soft-tissue]
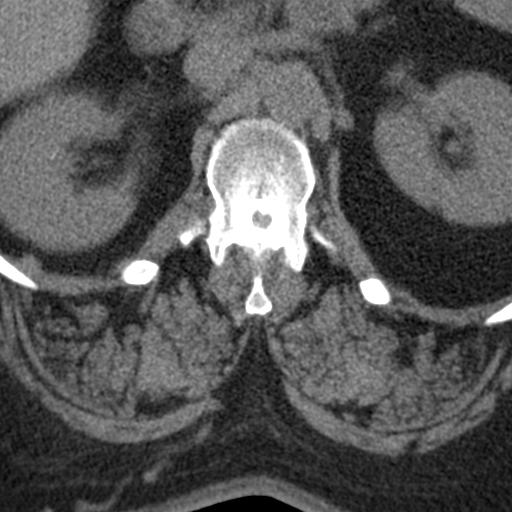
[im 57/69  bone]
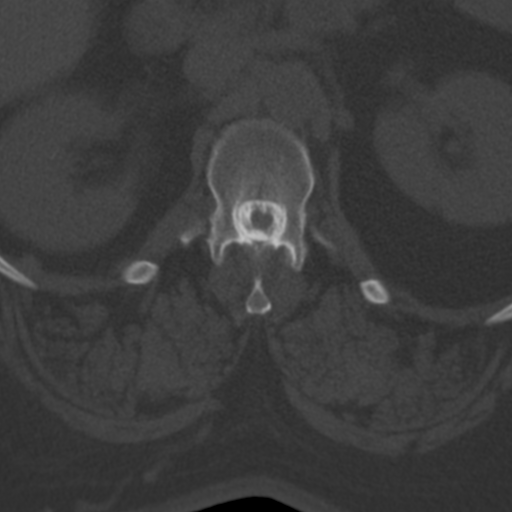

[5 of 14 positions shown; findings below may reference images not displayed]

EXAM:
LUMBAR MYELOGRAM

FLUOROSCOPY TIME:  1 minutes and 21 seconds corresponding to a Dose
Area Product of 550.22 Gy*m2

PROCEDURE:
After thorough discussion of risks and benefits of the procedure
including bleeding, infection, injury to nerves, blood vessels,
adjacent structures as well as headache and CSF leak, written and
oral informed consent was obtained. Consent was obtained by Dr. Mhargie
Agyeman. Time out form was completed.

Patient was positioned prone on the fluoroscopy table. Local
anesthesia was provided with 1% lidocaine without epinephrine after
prepped and draped in the usual sterile fashion. Puncture was
performed at L5-S1 using a 3 1/2 inch 22-gauge spinal needle via
midline approach. Using a single pass through the dura, the needle
was placed within the thecal sac, with return of clear CSF. 15 mL of
Isovue P-FHH was injected into the thecal sac, with normal
opacification of the nerve roots and cauda equina consistent with
free flow within the subarachnoid space.

I personally performed the lumbar puncture and administered the
intrathecal contrast. I also personally supervised acquisition of
the myelogram images.
FINDINGS: LUMBAR MYELOGRAM FINDINGS:

Good opacification lumbar subarachnoid space. Previous L3-L5 PLIF. 2
mm anterolisthesis L4 on L5, unchanged through standing flexion
extension. Moderate to severe stenosis L4-5, with BILATERAL L5 nerve
root impingement. Severe posterior element hypertrophy results in
dorsal impingement at the L4-5 level.

There is 2 mm anterolisthesis at L4-5 post fusion. This is unchanged
through standing flexion extension. There is 4 mm anterolisthesis
L5-S1, adjacent segment. This does not worsen between prone and
upright myelographic films.

CT LUMBAR MYELOGRAM FINDINGS:

Segmentation: Normal.

Alignment: 2 mm anterolisthesis L4-5. 3 mm anterolisthesis L5-S1.
Otherwise anatomic.

Vertebrae: No worrisome osseous lesion.

Conus medullaris: Normal in size, ending at upper L2.

Paraspinal tissues: No hydronephrosis or paravertebral mass.

Disc levels:

L1-L2: Mild facet arthropathy. Unremarkable disc space. No
impingement.

L2-L3: Mild facet arthropathy. Unremarkable disc space. No
impingement.

L3-L4: Partial interbody arthrodesis. The LEFT and RIGHT cages
appear to be incorporated within the bone, but there is bone graft
material centrally which is floating. Continued surveillance may be
warranted. No definite impingement.

L4-L5: Pseudarthrosis. 2 mm anterolisthesis. Subsidence of the cage
at L4-5. Vacuum phenomenon. BILATERAL L5 screw loosening. Severe
stenosis due to posterior element hypertrophy, predominantly facet
overgrowth. There is compression of the thecal sac and both L5 nerve
roots, worse on the RIGHT. BILATERAL foraminal narrowing is also
observed.

L5-S1: 3 mm anterolisthesis. Good disc height. Annular bulge. Facet
arthropathy, with posterior arthrodesis across the facets.. No
definite impingement.
IMPRESSION: LUMBAR MYELOGRAM IMPRESSION:

Status post L3-L5 fusion. Residual severe stenosis at L4-5. 2 mm
anterolisthesis L4-5 and 4 mm anterolisthesis L5-S1 are stable
through standing flexion extension.

CT LUMBAR MYELOGRAM IMPRESSION:

L4-5 pseudarthrosis. Vacuum phenomenon with subsidence of the single
interbody cage. BILATERAL L5 screw loosening. 2 mm anterolisthesis.
Severe stenosis due to posterior element hypertrophy, predominantly
facet overgrowth. BILATERAL L4 and L5 nerve root impingement.

Developing interbody arthrodesis L3-L4. The cages appear well seated
within the endplates. There is some free-floating bone graft
material centrally which may require continued surveillance.

3 mm anterolisthesis L5-S1 with good disc height but annular bulge
and facet arthropathy. Posterior arthrodesis. No definite
impingement.

## 2017-10-30 MED ORDER — MEPERIDINE HCL 100 MG/ML IJ SOLN
75.0000 mg | Freq: Once | INTRAMUSCULAR | Status: AC
  Administered 2017-10-30: 75 mg via INTRAMUSCULAR

## 2017-10-30 MED ORDER — ONDANSETRON HCL 4 MG/2ML IJ SOLN
4.0000 mg | Freq: Once | INTRAMUSCULAR | Status: AC
  Administered 2017-10-30: 4 mg via INTRAMUSCULAR

## 2017-10-30 MED ORDER — DIAZEPAM 5 MG PO TABS
10.0000 mg | ORAL_TABLET | Freq: Once | ORAL | Status: AC
  Administered 2017-10-30: 5 mg via ORAL

## 2017-10-30 MED ORDER — IOPAMIDOL (ISOVUE-M 200) INJECTION 41%
20.0000 mL | Freq: Once | INTRAMUSCULAR | Status: AC
  Administered 2017-10-30: 20 mL via INTRATHECAL

## 2017-10-30 NOTE — Discharge Instructions (Signed)
Myelogram Discharge Instructions  1. Go home and rest quietly for the next 24 hours.  It is important to lie flat for the next 24 hours.  Get up only to go to the restroom.  You may lie in the bed or on a couch on your back, your stomach, your left side or your right side.  You may have one pillow under your head.  You may have pillows between your knees while you are on your side or under your knees while you are on your back.  2. DO NOT drive today.  Recline the seat as far back as it will go, while still wearing your seat belt, on the way home.  3. You may get up to go to the bathroom as needed.  You may sit up for 10 minutes to eat.  You may resume your normal diet and medications unless otherwise indicated.  Drink lots of extra fluids today and tomorrow.  4. The incidence of headache, nausea, or vomiting is about 5% (one in 20 patients).  If you develop a headache, lie flat and drink plenty of fluids until the headache goes away.  Caffeinated beverages may be helpful.  If you develop severe nausea and vomiting or a headache that does not go away with flat bed rest, call [phone].  5. You may resume normal activities after your 24 hours of bed rest is over; however, do not exert yourself strongly or do any heavy lifting tomorrow. If when you get up you have a headache when standing, go back to bed and force fluids for another 24 hours.  6. Call your physician for a follow-up appointment.  The results of your myelogram will be sent directly to your physician by the following day.  7. If you have any questions or if complications develop after you arrive home, please call [phone].  Discharge instructions have been explained to the patient.  The patient, or the person responsible for the patient, fully understands these instructions.  YOU MAY RESTART YOUR PHENERGAN AND CYMBALTA TOMORROW 10/31/2017 AT 1:00 PM.

## 2017-10-30 NOTE — Progress Notes (Signed)
Pt states she has been off Cymbalta and Phenergan for the last 2 days.

## 2017-11-05 ENCOUNTER — Ambulatory Visit: Payer: Medicare Other | Attending: Oncology

## 2017-11-05 ENCOUNTER — Other Ambulatory Visit: Payer: Self-pay | Admitting: Neurological Surgery

## 2017-11-05 DIAGNOSIS — G8929 Other chronic pain: Secondary | ICD-10-CM | POA: Diagnosis present

## 2017-11-05 DIAGNOSIS — M25512 Pain in left shoulder: Secondary | ICD-10-CM | POA: Diagnosis not present

## 2017-11-05 DIAGNOSIS — M25611 Stiffness of right shoulder, not elsewhere classified: Secondary | ICD-10-CM | POA: Diagnosis present

## 2017-11-05 DIAGNOSIS — M25612 Stiffness of left shoulder, not elsewhere classified: Secondary | ICD-10-CM | POA: Diagnosis present

## 2017-11-05 DIAGNOSIS — R293 Abnormal posture: Secondary | ICD-10-CM | POA: Diagnosis present

## 2017-11-05 NOTE — Patient Instructions (Signed)
3 Way Raises:     Seated edge of chair with back straight or Starting Position:  Leaning against wall, walk feet a few inches away from the wall and make tummy tight (tuck hips underneath you) Press back/shoulders/head against wall as much as possible. Keep thumbs up to ceiling, elbows straight and shoulders relaxed/down throughout.  1. Lift arms in front to shoulder height 2. Lift arms a little wider into a "V" to shoulder height 3. Lift arms out to sides in a "T" to shoulder height  Perform 10 times in each direction. Hold 1-2 lbs to start with and work up to 2-3 sets of 10/day. Perform 3-4 times/week. Increase weight as able, decreasing sets of 10 each time you increase weights, then slowly working your way back up to 2-3 sets each time.    Cancer Rehab [phone]

## 2017-11-05 NOTE — Therapy (Addendum)
[place], [place], [address] Phone: [phone]   Fax:  [phone]  Physical Therapy Treatment  Patient Details  Name: Kelsey Wilson MRN: [id number] Date of Birth: 1960-06-17 Referring Provider: Dr. Lurline Del   Encounter Date: 11/05/2017  PT End of Session - 11/05/17 1014    Visit Number  7    Number of Visits  9    Date for PT Re-Evaluation  11/01/17    PT Start Time  0933    PT Stop Time  1014    PT Time Calculation (min)  41 min    Activity Tolerance  Patient tolerated treatment well    Behavior During Therapy  [facility] for tasks assessed/performed       Past Medical History:  Diagnosis Date  . Anemia   . Anxiety   . Arthritis   . Breast cancer ([facility]) 2010   T3N1 invasive ductal carcinoma left breast.Takes Arimidex daily  . Bursitis   . Carpal tunnel syndrome   . Chronic back pain    stenosis  . Constipation    takes Colace daily  . Depression    takes Benzotropine daily  . Diverticulitis of colon   . Dyspnea    daily when walking for over 1 yr.  . Fibromyalgia 08/2012  . GERD (gastroesophageal reflux disease)    takes Dexilant daily  . Hemorrhoid   . History of blood transfusion    no abnormal reaction noted  . History of colon polyps    benign  . History of shingles   . Joint pain   . Joint swelling   . Night muscle spasms    takes Flexeril nightly as needed  . Nocturia   . OSA (obstructive sleep apnea)   . OSA on CPAP   . Peripheral edema    takes Furosemide.Just started 01/18/16  . Peripheral neuropathy    takes Lyrica daily  . Personal history of chemotherapy    2013  . Personal history of radiation therapy    2013  . Pneumonia    hx of-2015  . Seasonal allergies    takes Singulair nightly  . SOB (shortness of breath) on exertion    rarely with exertion  . 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 Alphonzo Grieve)    Past Surgical History:  Procedure Laterality Date  . ABDOMINAL HYSTERECTOMY     still has ovaries  . APPENDECTOMY    . AXILLARY LYMPH NODE DISSECTION  11/28/2011   Procedure: AXILLARY LYMPH NODE DISSECTION;  Surgeon: Edward Jolly, MD;  Location: [facility];  Service: General;  Laterality: Left;  . BREAST LUMPECTOMY Left 11/28/2011   Malignant  . BREAST SURGERY Left 2013  . CARPAL TUNNEL RELEASE     Bilateral  . CESAREAN SECTION     pt. has had 3  . CHOLECYSTECTOMY    . COLONOSCOPY    . EYE SURGERY Bilateral    cataract removal  . KNEE SURGERY     Left Knee  . LAMINECTOMY WITH POSTERIOR LATERAL ARTHRODESIS LEVEL 2 N/A 06/07/2017   Procedure: Posterior Lateral Fusion Lumbar Three-Four and Transforaminal Interbody Fusion Lumbar Four-Five with Segmental  Pedicle Screw Fixation;  Surgeon: Eustace Moore, MD;  Location: [facility];  Service: Neurosurgery;  Laterality: N/A;  Posterior Lateral Fusion Lumbar Three-Four and Transforaminal Interbody Fusion Lumbar Four-Five with Segmental  Pedicle Screw Fixation   . LUMBAR FUSION  06/07/2017   POST  . LUMBAR LAMINECTOMY/DECOMPRESSION MICRODISCECTOMY Bilateral 01/27/2016   Procedure: Laminectomy and Foraminotomy - Lumbar four -Lumbar five - bilateral- on-lay noninstrumented fusion;  Surgeon: Eustace Moore, MD;  Location: [facility];  Service: Neurosurgery;  Laterality: Bilateral;  . MULTIPLE EXTRACTIONS WITH ALVEOLOPLASTY N/A 05/11/2016   Procedure: EXTRACTION OF TEETH EIGHTEEN, TWENTY AND TWENTY- NINE;  REMOVAL OF MANDIBULAR TORUS AND EXOSTOSIS;  Surgeon: Diona Browner, DDS;  Location: [facility];  Service: Oral Surgery;  Laterality: N/A;  . PORTACATH PLACEMENT  06/18/2011   Procedure: INSERTION PORT-A-CATH;  Surgeon: Edward Jolly, MD;  Location: [facility];  Service: General;  Laterality: Right;  right subclavian  . removal portacath  2014  . spur     [place]  spur on both big toes  . TOE SURGERY Bilateral   . TONSILLECTOMY    . TOTAL KNEE ARTHROPLASTY Left 09/13/2014  . TOTAL KNEE ARTHROPLASTY Left 09/13/2014   Procedure: LEFT TOTAL KNEE ARTHROPLASTY;  Surgeon: Rod Can, MD;  Location: [facility];  Service: Orthopedics;  Laterality: Left;  . TOTAL KNEE ARTHROPLASTY Right 03/10/2015   Procedure: RIGHT TOTAL KNEE ARTHROPLASTY;  Surgeon: Rod Can, MD;  Location: [facility];  Service: Orthopedics;  Laterality: Right;    There were no vitals filed for this visit.  Subjective Assessment - 11/05/17 0937    Subjective  I had the myelogram last Wednesday and my back hasn't been right since then. They used a dye and I felt it go in and it about killed me! But my Lt shoulder is doing better and I'm ready to D/C.     Pertinent History  Left breast cancer diagnosed 2012, neo-adjuvant chemo completed May 2013, left lumpectomy with ALND June 2013, and radiation treatment completed October 2013; has been on anastrozole since then. Recent CT scan showed no cancer to explain left arm pain; she does have two small lung nodules that are being watched.  She has been treated here for lymphedema some years ago. Fibromyalgia; neuropathy in hands and feet with pain, numbness, and tingling; back problems with surgery 06/07/17 for lumbar fusion and she reports it still hurts a lot; sleep apnea; diverticulitis; arthritis in back and "everywhere"; h/o total knee replacements bilat. in around 2015    Patient Stated Goals  get rid of this discomfort/soreness    Currently in Pain?  Yes    Pain Score  10-Worst pain ever    Pain Location  Back    Pain Orientation  Lower    Pain Descriptors / Indicators  Aching    Pain Type  Acute pain    Pain Radiating Towards  bil gluts    Pain Onset  More than a month ago    Pain Frequency  Constant    Aggravating Factors   from myelogram last Wednesday    Pain Relieving Factors  nothing has helped         [facility] PT Assessment - 11/05/17  0001      AROM   Left Shoulder Flexion  136 Degrees    Left Shoulder ABduction  131 Degrees                   [facility] Adult PT Treatment/Exercise - 11/05/17 0001      Shoulder Exercises: Seated   Flexion  --    Other Seated Exercises  3 way raises done in sitting due to pt having increased LBP  from myelogram last week. Flexion, scaption and abduction to shoulder height with 1 lb x10 each.       Shoulder Exercises: Pulleys   Flexion  2 minutes    Flexion Limitations  VCs to relax shoulders    ABduction  2 minutes    ABduction Limitations  VCs to relax shoulders and decrease side trunk lean      Shoulder Exercises: Therapy Ball   Flexion  Both;10 reps With forward lean into end of stretch    ABduction  Left;5 reps Same side lean into end of stetch      Manual Therapy   Passive ROM  In supine to left shoulder into er, abduction, and flexion; VCs throuhgout to relax muscles and able to stretch to pts end P/ROM with slow, prolonged stretching             PT Education - 11/05/17 0952    Education provided  Yes    Education Details  Seated 3 way raises with 1 lb     Person(s) Educated  Patient    Methods  Explanation;Demonstration;Handout    Comprehension  Verbalized understanding;Returned demonstration          PT Long Term Goals - 11/05/17 0957      PT LONG TERM GOAL #1   Title  Pt. will be independent in home exercise for shoulder ROM and strengthening.    Status  Achieved      PT LONG TERM GOAL #2   Baseline  Pt reports posture had improved until myelogram last week-11/05/17    Status  Achieved      PT LONG TERM GOAL #3   Title  Pt. will report at least 40% decrease in left axilla-area pain.    Baseline  50% improvement with pain at this time-10/24/17    Status  Achieved            Plan - 11/05/17 1049    Clinical Impression Statement  Progressed HEP to include seated 3 way raises (seated due to LBP) and pt tolerated these very well and was  encouraged she could do them with 1 lb. Instructed her can use can of veggies or soup at home for weight and she verbalized understanding this. Pt has done well with this episode of therapy, is working towards becoming more consistent with HEP and has now met all goals. She is ready for D/C and is agreeabale to this at this time.     Rehab Potential  Fair    Clinical Impairments Affecting Rehab Potential  multiple musculoskeletal problems including arthritis and fibromyalgia    PT Frequency  2x / week    PT Duration  4 weeks    PT Treatment/Interventions  ADLs/Self Care Home Management;Electrical Stimulation;Therapeutic exercise;Patient/family education;Manual techniques;Manual lymph drainage;Scar mobilization;Passive range of motion    PT Next Visit Plan  D/C this visit.     PT Home Exercise Plan  dowel cane exercises, supine scapular series and seated 3 way raises    Consulted and Agree with Plan of Care  Patient       Patient will benefit from skilled therapeutic intervention in order to improve the following deficits and impairments:  Pain, Decreased range of motion, Decreased strength, Impaired UE functional use  Visit Diagnosis: Chronic left shoulder pain  Stiffness of left shoulder, not elsewhere classified  Stiffness of right shoulder, not elsewhere classified  Abnormal posture     Problem List Patient Active Problem List   Diagnosis Date  Noted  . Lung nodule 09/15/2017  . Morbid obesity with BMI of 45.0-49.9, adult ([facility]) 09/05/2017  . 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 Vertigo 10/12/2016  . Neuropathic pain 06/25/2016  . 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 S/P lumbar spinal fusion 01/27/2016  . H/O therapeutic radiation 09/21/2015  . Pes planus of both feet 08/04/2015  . Primary osteoarthritis of right knee 03/10/2015  . Osteoarthritis of right knee 02/04/2015  . Primary osteoarthritis of left knee 09/13/2014  . Hot flashes  01/19/2014  . Abdominal pain, unspecified site 01/19/2014  . Malignant neoplasm of upper-outer quadrant of left breast in female, estrogen receptor positive ([facility]) 03/19/2013  . Lumbosacral spondylosis without myelopathy 12/30/2012  . Fibromyalgia syndrome 08/15/2012  . Peripheral neuropathy, toxic 08/15/2012  . Lymphedema of arm - left 04/30/2012    Otelia Limes, PTA 11/05/2017, 10:55 AM  [place], [place], [address] Phone: [phone]   Fax:  [phone]  Name: CAMREE WIGINGTON MRN: [id number] Date of Birth: 09/23/1960  PHYSICAL THERAPY DISCHARGE SUMMARY  Visits from Start of Care: 7  Current functional level related to goals / functional outcomes: Goals met as noted above.   Remaining deficits: Continues with some pain, intermittent challenges with posture.   Education / Equipment: Home exercise program. Plan: Patient agrees to discharge.  Patient goals were met. Patient is being discharged due to meeting the stated rehab goals.  ?????    Serafina Royals, PT 11/05/17 2:24 PM

## 2017-11-11 MED FILL — LYRICA 100 MG CAPSULE: 100 | 30 days supply | Qty: 90 | Fill #1

## 2017-11-15 ENCOUNTER — Other Ambulatory Visit: Payer: Self-pay

## 2017-11-15 MED ORDER — HYDROCODONE-ACETAMINOPHEN 5-325 MG PO TABS: 1.0000 | ORAL_TABLET | Freq: Four times a day (QID) | ORAL | 0 refills | Status: DC | PRN

## 2017-11-15 NOTE — Telephone Encounter (Signed)
Patient called today, states is currently out of hydrocodone medication. Saw Eunice on 10-22-2017 and in her note it stated that she wasn't giving her a script at that time, found out after calling patient that Zella Ball wanted to wait until closer until the 20th to refill the medication. Can you please refill this patients hydrocodone medication.    Last visit medication count was:  Tue Oct 22, 2017 9:18 AM     09/25/2017  #[id number]  V#[id number]       PMP website: 09/25/2017 1 09/23/2017 Hydrocodone-Acetamin 5-325 Mg

## 2017-11-15 NOTE — Telephone Encounter (Signed)
Notify pt that Rx sent to [facility] on [address]

## 2017-11-19 NOTE — Pre-Procedure Instructions (Signed)
Kelsey Wilson  11/19/2017      [id number] - [address], [address] AT [place] [address] Phone: [phone] Fax: [phone]  [place], [place] - [address] [address] Phone: [phone] Fax: [phone]    Your procedure is scheduled on Fri., November 29, 2017 from 12:20PM-2:50PM  Report to [facility] Admitting Entrance "A" at 10:15AM  Call this number if you have problems the morning of surgery:  [phone]   Remember:  Do not eat or drink after midnight on June 27th    Take these medicines the morning of surgery with A SIP OF WATER: Dexlansoprazole (Blossom), DULoxetine (CYMBALTA), Levocetirizine (XYZAL),  Pregabalin (LYRICA), and Valbenazine Tosylate [facility] If needed Ondansetron (ZOFRAN), Methocarbamol (ROBAXIN), HYDROcodone-acetaminophen (NORCO/VICODIN), and Benzonatate (TESSALON)   7 days before surgery (6/21), stop taking all Other Aspirin Products, Vitamins, Fish oils, and Herbal medications. Also stop all NSAIDS i.e. Advil, Ibuprofen, Motrin, Aleve, Anaprox, Naproxen, BC, Goody Powders, and all Supplements.    Do not wear jewelry, make-up or nail polish.  Do not wear lotions, powders, or perfumes, or deodorant.  Do not shave 48 hours prior to surgery.    Do not bring valuables to the hospital.  [facility] is not responsible for any belongings or valuables.  Contacts, dentures or bridgework may not be worn into surgery.  Leave your suitcase in the car.  After surgery it may be brought to your room.  For patients admitted to the hospital, discharge time will be determined by your treatment team.  Patients discharged the day of surgery will not be allowed to drive home.   Special instructions:  [place]- Preparing For Surgery  Before surgery, you can play an important role. Because  skin is not sterile, your skin needs to be as free of germs as possible. You can reduce the number of germs on your skin by washing with CHG (chlorahexidine gluconate) Soap before surgery.  CHG is an antiseptic cleaner which kills germs and bonds with the skin to continue killing germs even after washing.    Oral Hygiene is also important to reduce your risk of infection.  Remember - BRUSH YOUR TEETH THE MORNING OF SURGERY WITH YOUR REGULAR TOOTHPASTE  Please do not use if you have an allergy to CHG or antibacterial soaps. If your skin becomes reddened/irritated stop using the CHG.  Do not shave (including legs and underarms) for at least 48 hours prior to first CHG shower. It is OK to shave your face.  Please follow these instructions carefully.   1. Shower the NIGHT BEFORE SURGERY and the MORNING OF SURGERY with CHG.   2. If you chose to wash your hair, wash your hair first as usual with your normal shampoo.  3. After you shampoo, rinse your hair and body thoroughly to remove the shampoo.  4. Use CHG as you would any other liquid soap. You can apply CHG directly to the skin and wash gently with a scrungie or a clean washcloth.   5. Apply the CHG Soap to your body ONLY FROM THE NECK DOWN.  Do not use on open wounds or open sores. Avoid contact with your eyes, ears, mouth and genitals (private parts). Wash Face and genitals (private parts)  with your normal soap.  6. Wash thoroughly, paying special attention to the area where your surgery will  be performed.  7. Thoroughly rinse your body with warm water from the neck down.  8. DO NOT shower/wash with your normal soap after using and rinsing off the CHG Soap.  9. Pat yourself dry with a CLEAN TOWEL.  10. Wear CLEAN PAJAMAS to bed the night before surgery, wear comfortable clothes the morning of surgery  11. Place CLEAN SHEETS on your bed the night of your first shower and DO NOT SLEEP WITH PETS.  Day of Surgery:  Do not apply any  deodorants/lotions.  Please wear clean clothes to the hospital/surgery center.   Remember to brush your teeth WITH YOUR REGULAR TOOTHPASTE.  Please read over the following fact sheets that you were given. Pain Booklet, Coughing and Deep Breathing, MRSA Information and Surgical Site Infection Prevention

## 2017-11-20 ENCOUNTER — Encounter (HOSPITAL_COMMUNITY)
Admission: RE | Admit: 2017-11-20 | Discharge: 2017-11-20 | Disposition: A | Discharge status: Home / Self Care | Payer: Medicare Other | Source: Ambulatory Visit | Attending: Neurological Surgery | Admitting: Neurological Surgery

## 2017-11-20 ENCOUNTER — Other Ambulatory Visit: Payer: Self-pay

## 2017-11-20 ENCOUNTER — Encounter (HOSPITAL_COMMUNITY): Payer: Self-pay

## 2017-11-20 DIAGNOSIS — Z01812 Encounter for preprocedural laboratory examination: Secondary | ICD-10-CM | POA: Insufficient documentation

## 2017-11-20 HISTORY — DX: Unspecified fracture of unspecified lumbar vertebra, subsequent encounter for fracture with nonunion: S32.009K

## 2017-11-20 LAB — CBC WITH DIFFERENTIAL/PLATELET
Abs Immature Granulocytes: 0 10*3/uL (ref 0.0–0.1)
Basophils Absolute: 0 10*3/uL (ref 0.0–0.1)
Basophils Relative: 0 %
Eosinophils Absolute: 0.1 10*3/uL (ref 0.0–0.7)
Eosinophils Relative: 3 %
HCT: 38.1 % (ref 36.0–46.0)
Hemoglobin: 12 g/dL (ref 12.0–15.0)
Immature Granulocytes: 0 %
Lymphocytes Relative: 52 %
Lymphs Abs: 1.9 10*3/uL (ref 0.7–4.0)
MCH: 26.9 pg (ref 26.0–34.0)
MCHC: 31.5 g/dL (ref 30.0–36.0)
MCV: 85.4 fL (ref 78.0–100.0)
Monocytes Absolute: 0.3 10*3/uL (ref 0.1–1.0)
Monocytes Relative: 9 %
Neutro Abs: 1.3 10*3/uL — ABNORMAL LOW (ref 1.7–7.7)
Neutrophils Relative %: 36 %
Platelets: 206 10*3/uL (ref 150–400)
RBC: 4.46 MIL/uL (ref 3.87–5.11)
RDW: 14.7 % (ref 11.5–15.5)
WBC: 3.7 10*3/uL — ABNORMAL LOW (ref 4.0–10.5)

## 2017-11-20 LAB — BASIC METABOLIC PANEL
Anion gap: 11 (ref 5–15)
BUN: 12 mg/dL (ref 6–20)
CO2: 28 mmol/L (ref 22–32)
Calcium: 9.3 mg/dL (ref 8.9–10.3)
Chloride: 101 mmol/L (ref 101–111)
Creatinine, Ser: 0.79 mg/dL (ref 0.44–1.00)
GFR calc Af Amer: >60 mL/min (ref >60)
GFR calc non Af Amer: >60 mL/min (ref >60)
Glucose, Bld: 116 mg/dL — ABNORMAL HIGH (ref 65–99)
Potassium: 3.9 mmol/L (ref 3.5–5.1)
Sodium: 140 mmol/L (ref 135–145)

## 2017-11-20 LAB — TYPE AND SCREEN
ABO/RH(D): O POS
Antibody Screen: NEGATIVE

## 2017-11-20 LAB — PROTIME-INR
INR: 0.91
Prothrombin Time: 12.2 seconds (ref 11.4–15.2)

## 2017-11-20 LAB — SURGICAL PCR SCREEN
MRSA, PCR: NEGATIVE
Staphylococcus aureus: NEGATIVE

## 2017-11-20 NOTE — Progress Notes (Signed)
PCP - [place] [facility]  Cardiologist - Denies  Chest x-ray - 05/29/17 (E)  EKG - 05/29/17 (E)  Stress Test - 03/28/16 (E)  ECHO - 06/20/11 (E)  Cardiac Cath - Denies  Sleep Study - Yes- Positive CPAP - None- Insurance company took the machine back due to inadequate use.  LABS- 11/20/17: CBC w/D, BMP, PT, T/S  ASA-Denies   Anesthesia- No  Pt denies having chest pain, sob, or fever at this time. All instructions explained to the pt, with a verbal understanding of the material. Pt agrees to go over the instructions while at home for a better understanding. The opportunity to ask questions was provided.

## 2017-11-21 ENCOUNTER — Encounter: Payer: Self-pay | Admitting: Registered Nurse

## 2017-11-21 ENCOUNTER — Encounter: Payer: Medicare Other | Attending: Physical Medicine & Rehabilitation | Admitting: Registered Nurse

## 2017-11-21 VITALS — BP 128/87 | HR 90 | Ht 63.0 in | Wt 282.0 lb

## 2017-11-21 DIAGNOSIS — M797 Fibromyalgia: Secondary | ICD-10-CM

## 2017-11-21 DIAGNOSIS — G894 Chronic pain syndrome: Secondary | ICD-10-CM

## 2017-11-21 DIAGNOSIS — Z79891 Long term (current) use of opiate analgesic: Secondary | ICD-10-CM | POA: Diagnosis not present

## 2017-11-21 DIAGNOSIS — Z9889 Other specified postprocedural states: Secondary | ICD-10-CM | POA: Insufficient documentation

## 2017-11-21 DIAGNOSIS — M7062 Trochanteric bursitis, left hip: Secondary | ICD-10-CM

## 2017-11-21 DIAGNOSIS — M5416 Radiculopathy, lumbar region: Secondary | ICD-10-CM | POA: Diagnosis not present

## 2017-11-21 DIAGNOSIS — G8929 Other chronic pain: Secondary | ICD-10-CM | POA: Insufficient documentation

## 2017-11-21 DIAGNOSIS — F329 Major depressive disorder, single episode, unspecified: Secondary | ICD-10-CM | POA: Diagnosis not present

## 2017-11-21 DIAGNOSIS — G8918 Other acute postprocedural pain: Secondary | ICD-10-CM | POA: Diagnosis not present

## 2017-11-21 DIAGNOSIS — M48061 Spinal stenosis, lumbar region without neurogenic claudication: Secondary | ICD-10-CM | POA: Diagnosis not present

## 2017-11-21 DIAGNOSIS — Z5181 Encounter for therapeutic drug level monitoring: Secondary | ICD-10-CM | POA: Diagnosis not present

## 2017-11-21 DIAGNOSIS — M48062 Spinal stenosis, lumbar region with neurogenic claudication: Secondary | ICD-10-CM | POA: Diagnosis not present

## 2017-11-21 DIAGNOSIS — M1711 Unilateral primary osteoarthritis, right knee: Secondary | ICD-10-CM

## 2017-11-21 DIAGNOSIS — G4733 Obstructive sleep apnea (adult) (pediatric): Secondary | ICD-10-CM | POA: Insufficient documentation

## 2017-11-21 DIAGNOSIS — M62838 Other muscle spasm: Secondary | ICD-10-CM

## 2017-11-21 DIAGNOSIS — Z981 Arthrodesis status: Secondary | ICD-10-CM | POA: Diagnosis not present

## 2017-11-21 DIAGNOSIS — M25511 Pain in right shoulder: Secondary | ICD-10-CM

## 2017-11-21 DIAGNOSIS — Z853 Personal history of malignant neoplasm of breast: Secondary | ICD-10-CM | POA: Insufficient documentation

## 2017-11-21 DIAGNOSIS — K219 Gastro-esophageal reflux disease without esophagitis: Secondary | ICD-10-CM | POA: Insufficient documentation

## 2017-11-21 DIAGNOSIS — M545 Low back pain: Secondary | ICD-10-CM | POA: Insufficient documentation

## 2017-11-21 DIAGNOSIS — M7061 Trochanteric bursitis, right hip: Secondary | ICD-10-CM

## 2017-11-21 DIAGNOSIS — M25512 Pain in left shoulder: Secondary | ICD-10-CM

## 2017-11-21 DIAGNOSIS — M47817 Spondylosis without myelopathy or radiculopathy, lumbosacral region: Secondary | ICD-10-CM | POA: Diagnosis not present

## 2017-11-21 DIAGNOSIS — M1712 Unilateral primary osteoarthritis, left knee: Secondary | ICD-10-CM

## 2017-11-21 NOTE — Progress Notes (Signed)
Subjective:    Patient ID: Kelsey Wilson, female    DOB: 1961/03/27, 57 y.o.   MRN: [id number]  HPI: Kelsey Wilson is a 57 year female who returns for follow up appointment for chronic pain and medication refill. She states her pain is located in her bilateral shoulders L>R, Lower back radiating into her bilateral buttocks, bilateral hips and bilateral lower extremities. She rates her pain 8. Her current exercise regime is walking.   Kelsey Wilson is schedule for Posterior Lateral Fusion L-4-L-5 removal and replacement of hardware, Laminectomy L4-L5 by Dr. Ronnald Ramp.   Kelsey Wilson Morphine Equivalent is 20.00 MME. Last UDS was Performed on 08/12/2017. Oral Swab was Performed Today.    Pain Inventory Average Pain 8 Pain Right Now 8 My pain is aching  In the last 24 hours, has pain interfered with the following? General activity 2 Relation with others 2 Enjoyment of life 2 What TIME of day is your pain at its worst? all Sleep (in general) Poor  Pain is worse with: walking, bending, sitting, inactivity, standing and some activites Pain improves with: medication Relief from Meds: 1  Mobility walk with assistance use a cane ability to climb steps?  no do you drive?  yes  Function disabled: date disabled .  Neuro/Psych weakness numbness tingling spasms depression anxiety  Prior Studies Any changes since last visit?  no  Physicians involved in your care Any changes since last visit?  no   Family History  Problem Relation Age of Onset  . Hypertension Mother   . Diabetes Mother   . Hypertension Father   . Diabetes Father   . Cancer Paternal Grandmother        unknown  . Colon cancer Neg Hx    Social History   Socioeconomic History  . Marital status: Single    Spouse name: Not on file  . Number of children: 3  . Years of education: Not on file  . Highest education level: Not on file  Occupational History  . Not on file  Social Needs  . Financial  resource strain: Not on file  . Food insecurity:    Worry: Not on file    Inability: Not on file  . Transportation needs:    Medical: Not on file    Non-medical: Not on file  Tobacco Use  . Smoking status: Never Smoker  . Smokeless tobacco: Never Used  Substance and Sexual Activity  . Alcohol use: No    Alcohol/week: 0.0 oz  . Drug use: No  . Sexual activity: Never    Birth control/protection: Post-menopausal, Surgical  Lifestyle  . Physical activity:    Days per week: Not on file    Minutes per session: Not on file  . Stress: Not on file  Relationships  . Social connections:    Talks on phone: Not on file    Gets together: Not on file    Attends religious service: Not on file    Active member of club or organization: Not on file    Attends meetings of clubs or organizations: Not on file    Relationship status: Not on file  Other Topics Concern  . Not on file  Social History Narrative  . Not on file   Past Surgical History:  Procedure Laterality Date  . ABDOMINAL HYSTERECTOMY     still has ovaries  . APPENDECTOMY    . AXILLARY LYMPH NODE DISSECTION  11/28/2011   Procedure: AXILLARY LYMPH NODE  DISSECTION;  Surgeon: Edward Jolly, MD;  Location: [place];  Service: General;  Laterality: Left;  . BREAST LUMPECTOMY Left 11/28/2011   Malignant  . BREAST SURGERY Left 2013  . CARPAL TUNNEL RELEASE     Bilateral  . CESAREAN SECTION     pt. has had 3  . CHOLECYSTECTOMY    . COLONOSCOPY    . EYE SURGERY Bilateral    cataract removal  . KNEE SURGERY     Left Knee  . LAMINECTOMY WITH POSTERIOR LATERAL ARTHRODESIS LEVEL 2 N/A 06/07/2017   Procedure: Posterior Lateral Fusion Lumbar Three-Four and Transforaminal Interbody Fusion Lumbar Four-Five with Segmental  Pedicle Screw Fixation;  Surgeon: Eustace Moore, MD;  Location: [place];  Service: Neurosurgery;  Laterality: N/A;  Posterior Lateral Fusion Lumbar Three-Four and Transforaminal Interbody Fusion Lumbar Four-Five with  Segmental  Pedicle Screw Fixation   . LUMBAR FUSION  06/07/2017   POST  . LUMBAR LAMINECTOMY/DECOMPRESSION MICRODISCECTOMY Bilateral 01/27/2016   Procedure: Laminectomy and Foraminotomy - Lumbar four -Lumbar five - bilateral- on-lay noninstrumented fusion;  Surgeon: Eustace Moore, MD;  Location: [facility];  Service: Neurosurgery;  Laterality: Bilateral;  . MULTIPLE EXTRACTIONS WITH ALVEOLOPLASTY N/A 05/11/2016   Procedure: EXTRACTION OF TEETH EIGHTEEN, TWENTY AND TWENTY- NINE;  REMOVAL OF MANDIBULAR TORUS AND EXOSTOSIS;  Surgeon: Diona Browner, DDS;  Location: [facility];  Service: Oral Surgery;  Laterality: N/A;  . PORTACATH PLACEMENT  06/18/2011   Procedure: INSERTION PORT-A-CATH;  Surgeon: Edward Jolly, MD;  Location: [place];  Service: General;  Laterality: Right;  right subclavian  . removal portacath  2014  . spur     Apex spur on both big toes  . TOE SURGERY Bilateral   . TONSILLECTOMY    . TOTAL KNEE ARTHROPLASTY Left 09/13/2014  . TOTAL KNEE ARTHROPLASTY Left 09/13/2014   Procedure: LEFT TOTAL KNEE ARTHROPLASTY;  Surgeon: Rod Can, MD;  Location: [place];  Service: Orthopedics;  Laterality: Left;  . TOTAL KNEE ARTHROPLASTY Right 03/10/2015   Procedure: RIGHT TOTAL KNEE ARTHROPLASTY;  Surgeon: Rod Can, MD;  Location: [facility];  Service: Orthopedics;  Laterality: Right;   Past Medical History:  Diagnosis Date  . Anemia   . Anxiety   . Arthritis   . Breast cancer ([place]) 2010   T3N1 invasive ductal carcinoma left breast.Takes Arimidex daily  . Bursitis   . Carpal tunnel syndrome   . Chronic back pain    stenosis  . Constipation    takes Colace daily  . Depression    takes Benzotropine daily  . Diverticulitis of colon   . Dyspnea    daily when walking for over 1 yr.  . Fibromyalgia 08/2012  . GERD (gastroesophageal reflux disease)    takes Dexilant daily  . Hemorrhoid   . History of blood transfusion    no abnormal reaction noted  . History of  colon polyps    benign  . History of shingles   . Joint pain   . Joint swelling   . Night muscle spasms    takes Flexeril nightly as needed  . Nocturia   . OSA (obstructive sleep apnea)   . OSA on CPAP   . Peripheral edema    takes Furosemide.Just started 01/18/16  . Peripheral neuropathy    takes Lyrica daily  . Personal history of chemotherapy    2013  . Personal history of radiation therapy    2013  . Pneumonia    hx  of-2015  . Pseudoarthrosis of lumbar spine   . Seasonal allergies    takes Singulair nightly  . SOB (shortness of breath) on exertion    rarely with exertion  . 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 Alphonzo Grieve)   BP 128/87 (BP Location: Left Arm, Patient Position: Sitting, Cuff Size: Large)   Pulse 90   Ht 5\' 3"  (1.6 m)   Wt 282 lb (127.9 kg)   SpO2 93%   BMI 49.95 kg/m   Opioid Risk Score:   Fall Risk Score:  `1  Depression screen PHQ 2/9  Depression screen [facility] 2/9 06/21/2017 03/07/2017 08/23/2015 04/26/2015 02/04/2015  Decreased Interest 1 2 2  0 0  Down, Depressed, Hopeless 1 2 0 0 0  PHQ - 2 Score 2 4 2  0 0  Altered sleeping - 0 1 - -  Tired, decreased energy - 3 3 - -  Change in appetite - 3 3 - -  Feeling bad or failure about yourself  - 2 0 - -  Trouble concentrating - 3 3 - -  Moving slowly or fidgety/restless - 3 2 - -  Suicidal thoughts - 0 0 - -  PHQ-9 Score - 18 14 - -  Difficult doing work/chores - Extremely dIfficult Very difficult - -  Some recent data might be hidden    Review of Systems  Constitutional: Positive for diaphoresis and unexpected weight change.  HENT: Negative.   Eyes: Negative.   Respiratory: Positive for apnea, shortness of breath and wheezing.   Gastrointestinal: Positive for nausea.  Endocrine: Negative.   Genitourinary: Negative.   Musculoskeletal: Positive for arthralgias, back pain and gait  problem.       Spasms   Skin: Negative.   Allergic/Immunologic: Negative.   Neurological: Positive for weakness and numbness.       Tingling   Psychiatric/Behavioral: Positive for dysphoric mood. The patient is nervous/anxious.        Objective:   Physical Exam  Constitutional: She is oriented to person, place, and time. She appears well-developed and well-nourished.  HENT:  Head: Normocephalic and atraumatic.  Neck: Normal range of motion. Neck supple.  Cardiovascular: Normal rate and regular rhythm.  Pulmonary/Chest: Effort normal and breath sounds normal.  Musculoskeletal:  Normal Muscle Bulk and Muscle Testing Reveals: Upper Extremities: Full ROM and Muscle Strength 5/5 Bilateral AC Joint Tenderness: L>R Thoracic Paraspinal Tenderness: T-1-T-7 Mainly Left Side Lumbar Paraspinal Tenderness: L-3-L-5 Bilateral Greater Trochanter Tenderness Lower Extremities: Full ROM and Muscle Strength 5/5 Arises from Table Slowly Antalgic Gait  Neurological: She is alert and oriented to person, place, and time.  Skin: Skin is warm and dry.  Psychiatric: She has a normal mood and affect. Her behavior is normal.  Nursing note and vitals reviewed.         Assessment & Plan:  1. Lumbar Postlaminectomy/ S/P Lumbar Fusion:Spinal Stenosis: She's scheduled for  Posterior Lateral Fusion L-4-L-5 removal and replacement of hardware, Laminectomy L4-L5 by Dr. Ronnald Ramp on 11/29/2017.  Continue HEP as Tolerated and Continue current medication regime. Hydrocodone 5/325 mg one tablet every 6 hours as needed. No script given today. 11/21/2017  2. Lumbar Radiculitis: Continue current medication regime with Lyrica. 11/21/2017 3. Fibromyalgia: Encouraged to Increase HEP as Tolerated. Continue Current Medication Regimen with Lyrica. 11/21/2017 4. Bilateral Greater Trochanter Bursitis: Continue to Alternate Ice and Heat Therapy. 11/21/2017. 5. Muscle Spasm: Continue: Robaxin.Continue to  Monitor.  11/21/2017. 6. Chronic Pain Syndrome: Continue Current medication regimen and HEP as Tolerated. Continue to Monitor. 11/21/2017. 7. Bilateral OA of Bilateral Knees: Also reports she has Voltaren Gel at home, she uses it sparingly due to GI upset. Continue to Monitor. 11/21/2017. 8. Bilateral Hand Pain: Continue to Monitor/ ? OA: Continue to Monitor. 0620/2019  20 minutes of Face to Face Patient Care time was spent during this visit. All questions encouraged and answered,   F/U in 1 month

## 2017-11-24 LAB — DRUG TOX MONITOR 1 W/CONF, ORAL FLD
Amphetamines: NEGATIVE ng/mL (ref <10)
Barbiturates: NEGATIVE ng/mL (ref <10)
Benzodiazepines: NEGATIVE ng/mL (ref <0.50)
Buprenorphine: NEGATIVE ng/mL (ref <0.10)
Cocaine: NEGATIVE ng/mL (ref <5.0)
Codeine: NEGATIVE ng/mL (ref <2.5)
Dihydrocodeine: NEGATIVE ng/mL (ref <2.5)
Fentanyl: NEGATIVE ng/mL (ref <0.10)
Heroin Metabolite: NEGATIVE ng/mL (ref <1.0)
Hydrocodone: 5.2 ng/mL — ABNORMAL HIGH (ref <2.5)
Hydromorphone: NEGATIVE ng/mL (ref <2.5)
MARIJUANA: NEGATIVE ng/mL (ref <2.5)
MDMA: NEGATIVE ng/mL (ref <10)
Meprobamate: NEGATIVE ng/mL (ref <2.5)
Methadone: NEGATIVE ng/mL (ref <5.0)
Morphine: NEGATIVE ng/mL (ref <2.5)
Nicotine Metabolite: NEGATIVE ng/mL (ref <5.0)
Norhydrocodone: NEGATIVE ng/mL (ref <2.5)
Noroxycodone: NEGATIVE ng/mL (ref <2.5)
Opiates: POSITIVE ng/mL — AB (ref <2.5)
Oxycodone: NEGATIVE ng/mL (ref <2.5)
Oxymorphone: NEGATIVE ng/mL (ref <2.5)
Phencyclidine: NEGATIVE ng/mL (ref <10)
Tapentadol: NEGATIVE ng/mL (ref <5.0)
Tramadol: NEGATIVE ng/mL (ref <5.0)
Zolpidem: NEGATIVE ng/mL (ref <5.0)

## 2017-11-24 LAB — DRUG TOX ALC METAB W/CON, ORAL FLD: Alcohol Metabolite: NEGATIVE ng/mL (ref <25)

## 2017-11-25 ENCOUNTER — Telehealth: Payer: Self-pay | Admitting: *Deleted

## 2017-11-25 NOTE — Telephone Encounter (Signed)
Oral swab drug screen was consistent for prescribed medications.  ?

## 2017-11-27 MED FILL — ANASTROZOLE 1 MG TABLET: 1 | 30 days supply | Qty: 30 | Fill #2

## 2017-11-28 MED ORDER — DEXAMETHASONE SODIUM PHOSPHATE 10 MG/ML IJ SOLN
10.0000 mg | INTRAMUSCULAR | Status: AC
  Administered 2017-11-29: 10 mg via INTRAVENOUS
  Filled 2017-11-28: qty 1

## 2017-11-28 MED ORDER — VANCOMYCIN HCL 10 G IV SOLR
1500.0000 mg | INTRAVENOUS | Status: AC
  Administered 2017-11-29: 1500 mg via INTRAVENOUS
  Filled 2017-11-28: qty 1500

## 2017-11-29 ENCOUNTER — Inpatient Hospital Stay (HOSPITAL_COMMUNITY): Payer: Medicare Other | Admitting: Certified Registered Nurse Anesthetist

## 2017-11-29 ENCOUNTER — Inpatient Hospital Stay (HOSPITAL_COMMUNITY): Payer: Medicare Other

## 2017-11-29 ENCOUNTER — Encounter (HOSPITAL_COMMUNITY): Payer: Self-pay | Admitting: *Deleted

## 2017-11-29 ENCOUNTER — Inpatient Hospital Stay (HOSPITAL_COMMUNITY)
Admission: RE | Admit: 2017-11-29 | Discharge: 2017-12-02 | DRG: 460 | Disposition: A | Discharge status: Home / Self Care | Payer: Medicare Other | Source: Ambulatory Visit | Attending: Neurological Surgery | Admitting: Neurological Surgery

## 2017-11-29 ENCOUNTER — Encounter (HOSPITAL_COMMUNITY)
Admission: RE | Disposition: A | Discharge status: Home / Self Care | Payer: Self-pay | Source: Ambulatory Visit | Attending: Neurological Surgery

## 2017-11-29 DIAGNOSIS — R600 Localized edema: Secondary | ICD-10-CM | POA: Diagnosis present

## 2017-11-29 DIAGNOSIS — M960 Pseudarthrosis after fusion or arthrodesis: Secondary | ICD-10-CM | POA: Diagnosis present

## 2017-11-29 DIAGNOSIS — K219 Gastro-esophageal reflux disease without esophagitis: Secondary | ICD-10-CM | POA: Diagnosis present

## 2017-11-29 DIAGNOSIS — M62838 Other muscle spasm: Secondary | ICD-10-CM | POA: Diagnosis present

## 2017-11-29 DIAGNOSIS — Z923 Personal history of irradiation: Secondary | ICD-10-CM

## 2017-11-29 DIAGNOSIS — F329 Major depressive disorder, single episode, unspecified: Secondary | ICD-10-CM | POA: Diagnosis present

## 2017-11-29 DIAGNOSIS — Z853 Personal history of malignant neoplasm of breast: Secondary | ICD-10-CM | POA: Diagnosis not present

## 2017-11-29 DIAGNOSIS — T84226A Displacement of internal fixation device of vertebrae, initial encounter: Secondary | ICD-10-CM | POA: Diagnosis present

## 2017-11-29 DIAGNOSIS — Z9221 Personal history of antineoplastic chemotherapy: Secondary | ICD-10-CM

## 2017-11-29 DIAGNOSIS — F419 Anxiety disorder, unspecified: Secondary | ICD-10-CM | POA: Diagnosis present

## 2017-11-29 DIAGNOSIS — Z86010 Personal history of colonic polyps: Secondary | ICD-10-CM

## 2017-11-29 DIAGNOSIS — K5900 Constipation, unspecified: Secondary | ICD-10-CM | POA: Diagnosis present

## 2017-11-29 DIAGNOSIS — Z79899 Other long term (current) drug therapy: Secondary | ICD-10-CM | POA: Diagnosis not present

## 2017-11-29 DIAGNOSIS — G629 Polyneuropathy, unspecified: Secondary | ICD-10-CM | POA: Diagnosis present

## 2017-11-29 DIAGNOSIS — G4733 Obstructive sleep apnea (adult) (pediatric): Secondary | ICD-10-CM | POA: Diagnosis present

## 2017-11-29 DIAGNOSIS — Z6841 Body Mass Index (BMI) 40.0 and over, adult: Secondary | ICD-10-CM | POA: Diagnosis not present

## 2017-11-29 DIAGNOSIS — J302 Other seasonal allergic rhinitis: Secondary | ICD-10-CM | POA: Diagnosis present

## 2017-11-29 DIAGNOSIS — Z981 Arthrodesis status: Secondary | ICD-10-CM

## 2017-11-29 DIAGNOSIS — Z809 Family history of malignant neoplasm, unspecified: Secondary | ICD-10-CM

## 2017-11-29 DIAGNOSIS — Z96653 Presence of artificial knee joint, bilateral: Secondary | ICD-10-CM | POA: Diagnosis present

## 2017-11-29 DIAGNOSIS — M797 Fibromyalgia: Secondary | ICD-10-CM | POA: Diagnosis present

## 2017-11-29 DIAGNOSIS — M48061 Spinal stenosis, lumbar region without neurogenic claudication: Secondary | ICD-10-CM | POA: Diagnosis present

## 2017-11-29 DIAGNOSIS — Z79811 Long term (current) use of aromatase inhibitors: Secondary | ICD-10-CM | POA: Diagnosis not present

## 2017-11-29 DIAGNOSIS — Z90710 Acquired absence of both cervix and uterus: Secondary | ICD-10-CM

## 2017-11-29 DIAGNOSIS — I739 Peripheral vascular disease, unspecified: Secondary | ICD-10-CM | POA: Diagnosis present

## 2017-11-29 DIAGNOSIS — Z419 Encounter for procedure for purposes other than remedying health state, unspecified: Secondary | ICD-10-CM

## 2017-11-29 DIAGNOSIS — Z8249 Family history of ischemic heart disease and other diseases of the circulatory system: Secondary | ICD-10-CM

## 2017-11-29 DIAGNOSIS — Z880 Allergy status to penicillin: Secondary | ICD-10-CM

## 2017-11-29 DIAGNOSIS — Y792 Prosthetic and other implants, materials and accessory orthopedic devices associated with adverse incidents: Secondary | ICD-10-CM | POA: Diagnosis present

## 2017-11-29 DIAGNOSIS — Z9049 Acquired absence of other specified parts of digestive tract: Secondary | ICD-10-CM | POA: Diagnosis not present

## 2017-11-29 DIAGNOSIS — Z885 Allergy status to narcotic agent status: Secondary | ICD-10-CM

## 2017-11-29 HISTORY — PX: LAMINECTOMY WITH POSTERIOR LATERAL ARTHRODESIS LEVEL 1: SHX6335

## 2017-11-29 LAB — GLUCOSE, CAPILLARY: Glucose-Capillary: 235 mg/dL — ABNORMAL HIGH (ref 70–99)

## 2017-11-29 IMAGING — RF DG LUMBAR SPINE 2-3V
1 series · 2 of 2 positions shown · non-contrast
Comparison: CT myelogram 10/30/2017

CLINICAL DATA: History of lumbar fusion. Fluoroscopy time was 6
seconds. Removal and replacement of hardware.

EXAM:
DG C-ARM 61-120 MIN; LUMBAR SPINE - 2-3 VIEW

[Series 1: run · 2 of 2 slices shown]
[im 1/2]
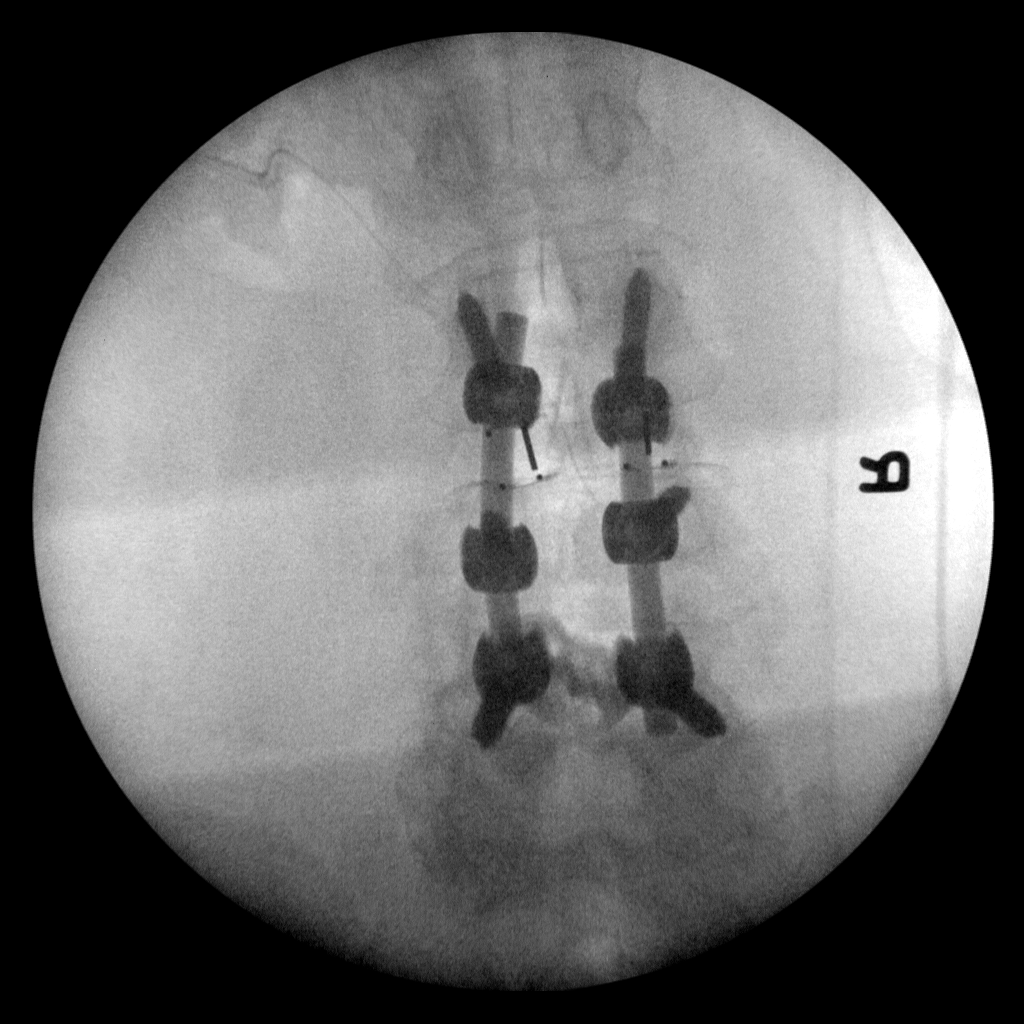
[im 2/2]
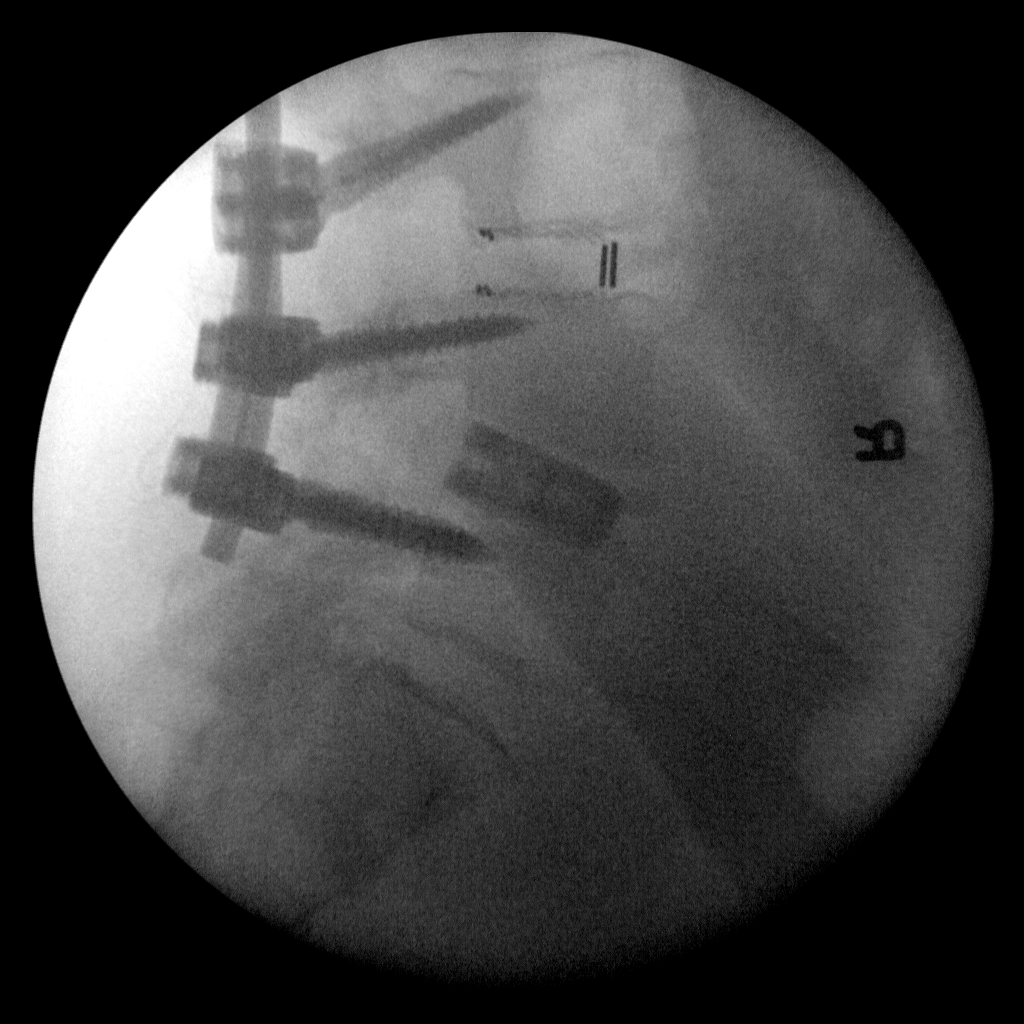

[2 of 2 positions shown; findings below may reference images not displayed]

FINDINGS: Two intraoperative fluoroscopic images of the spine were obtained.
There is bilateral pedicle screw and rod fixation at L3, L4 and L5.
Interbody devices at L3-L4 and L4-L5.
IMPRESSION: Fluoroscopic images of the lumbar spine for surgery.

## 2017-11-29 SURGERY — LAMINECTOMY WITH POSTERIOR LATERAL ARTHRODESIS LEVEL 1
Anesthesia: General | Site: Back

## 2017-11-29 MED ORDER — ONDANSETRON HCL 4 MG/2ML IJ SOLN: 4.0000 mg | Freq: Once | INTRAMUSCULAR | Status: DC | PRN

## 2017-11-29 MED ORDER — SURGIFOAM 100 EX MISC
CUTANEOUS | Status: DC | PRN
  Administered 2017-11-29: 14:00:00 via TOPICAL

## 2017-11-29 MED ORDER — SODIUM CHLORIDE 0.9% FLUSH: 3.0000 mL | INTRAVENOUS | Status: DC | PRN

## 2017-11-29 MED ORDER — SUGAMMADEX SODIUM 200 MG/2ML IV SOLN
INTRAVENOUS | Status: DC | PRN
  Administered 2017-11-29: 300 mg via INTRAVENOUS

## 2017-11-29 MED ORDER — FENTANYL CITRATE (PF) 250 MCG/5ML IJ SOLN
INTRAMUSCULAR | Status: AC
  Filled 2017-11-29: qty 5

## 2017-11-29 MED ORDER — VALBENAZINE TOSYLATE 40 MG PO CAPS
40.0000 mg | ORAL_CAPSULE | Freq: Every day | ORAL | Status: DC
  Administered 2017-11-30 – 2017-12-02 (×3): 40 mg via ORAL
  Filled 2017-11-29 (×3): qty 1

## 2017-11-29 MED ORDER — SODIUM CHLORIDE 0.9% FLUSH
3.0000 mL | Freq: Two times a day (BID) | INTRAVENOUS | Status: DC
  Administered 2017-11-30: 3 mL via INTRAVENOUS

## 2017-11-29 MED ORDER — SODIUM CHLORIDE 0.9 % IV SOLN
INTRAVENOUS | Status: DC | PRN
  Administered 2017-11-29: 500 mL

## 2017-11-29 MED ORDER — LORATADINE 10 MG PO TABS
10.0000 mg | ORAL_TABLET | Freq: Every day | ORAL | Status: DC
  Administered 2017-11-30 – 2017-12-02 (×3): 10 mg via ORAL
  Filled 2017-11-29 (×3): qty 1

## 2017-11-29 MED ORDER — MENTHOL 3 MG MT LOZG
1.0000 | LOZENGE | OROMUCOSAL | Status: DC | PRN
  Filled 2017-11-29: qty 9

## 2017-11-29 MED ORDER — PREGABALIN 100 MG PO CAPS
100.0000 mg | ORAL_CAPSULE | Freq: Three times a day (TID) | ORAL | Status: DC
  Administered 2017-11-29 – 2017-12-02 (×8): 100 mg via ORAL
  Filled 2017-11-29 (×8): qty 1

## 2017-11-29 MED ORDER — LIDOCAINE HCL (CARDIAC) PF 100 MG/5ML IV SOSY
PREFILLED_SYRINGE | INTRAVENOUS | Status: DC | PRN
  Administered 2017-11-29: 80 mg via INTRAVENOUS

## 2017-11-29 MED ORDER — METHOCARBAMOL 500 MG PO TABS
500.0000 mg | ORAL_TABLET | Freq: Four times a day (QID) | ORAL | Status: DC | PRN
  Administered 2017-11-29 – 2017-12-02 (×5): 500 mg via ORAL
  Filled 2017-11-29 (×5): qty 1

## 2017-11-29 MED ORDER — BUPIVACAINE HCL (PF) 0.25 % IJ SOLN: INTRAMUSCULAR | Status: DC | PRN

## 2017-11-29 MED ORDER — SODIUM CHLORIDE 0.9 % IV SOLN: 250.0000 mL | INTRAVENOUS | Status: DC

## 2017-11-29 MED ORDER — ONDANSETRON HCL 4 MG/2ML IJ SOLN
INTRAMUSCULAR | Status: AC
  Filled 2017-11-29: qty 2

## 2017-11-29 MED ORDER — THROMBIN 20000 UNITS EX SOLR
CUTANEOUS | Status: AC
  Filled 2017-11-29: qty 20000

## 2017-11-29 MED ORDER — METHOCARBAMOL 500 MG PO TABS
ORAL_TABLET | ORAL | Status: AC
  Administered 2017-11-29: 500 mg via ORAL
  Filled 2017-11-29: qty 1

## 2017-11-29 MED ORDER — LIDOCAINE 2% (20 MG/ML) 5 ML SYRINGE
INTRAMUSCULAR | Status: AC
  Filled 2017-11-29: qty 10

## 2017-11-29 MED ORDER — SUGAMMADEX SODIUM 200 MG/2ML IV SOLN
INTRAVENOUS | Status: AC
  Filled 2017-11-29: qty 2

## 2017-11-29 MED ORDER — ACETAMINOPHEN 160 MG/5ML PO SOLN: 325.0000 mg | ORAL | Status: DC | PRN

## 2017-11-29 MED ORDER — THROMBIN 5000 UNITS EX SOLR: CUTANEOUS | Status: DC | PRN

## 2017-11-29 MED ORDER — DULOXETINE HCL 20 MG PO CPEP
20.0000 mg | ORAL_CAPSULE | Freq: Every day | ORAL | Status: DC
  Administered 2017-11-30 – 2017-12-02 (×3): 20 mg via ORAL
  Filled 2017-11-29 (×3): qty 1

## 2017-11-29 MED ORDER — VANCOMYCIN HCL 1000 MG IV SOLR
INTRAVENOUS | Status: AC
  Filled 2017-11-29: qty 1000

## 2017-11-29 MED ORDER — ONDANSETRON HCL 4 MG PO TABS: 4.0000 mg | ORAL_TABLET | Freq: Four times a day (QID) | ORAL | Status: DC | PRN

## 2017-11-29 MED ORDER — ACETAMINOPHEN 650 MG RE SUPP: 650.0000 mg | RECTAL | Status: DC | PRN

## 2017-11-29 MED ORDER — PROPOFOL 10 MG/ML IV BOLUS
INTRAVENOUS | Status: DC | PRN
  Administered 2017-11-29: 200 mg via INTRAVENOUS

## 2017-11-29 MED ORDER — CHLORHEXIDINE GLUCONATE CLOTH 2 % EX PADS: 6.0000 | MEDICATED_PAD | Freq: Once | CUTANEOUS | Status: DC

## 2017-11-29 MED ORDER — FENTANYL CITRATE (PF) 100 MCG/2ML IJ SOLN
INTRAMUSCULAR | Status: DC | PRN
  Administered 2017-11-29 (×2): 50 ug via INTRAVENOUS
  Administered 2017-11-29: 100 ug via INTRAVENOUS
  Administered 2017-11-29: 50 ug via INTRAVENOUS

## 2017-11-29 MED ORDER — PROPOFOL 10 MG/ML IV BOLUS
INTRAVENOUS | Status: AC
  Filled 2017-11-29: qty 20

## 2017-11-29 MED ORDER — MIDAZOLAM HCL 5 MG/5ML IJ SOLN
INTRAMUSCULAR | Status: DC | PRN
  Administered 2017-11-29: 2 mg via INTRAVENOUS

## 2017-11-29 MED ORDER — MONTELUKAST SODIUM 10 MG PO TABS
10.0000 mg | ORAL_TABLET | Freq: Every day | ORAL | Status: DC
  Administered 2017-11-29 – 2017-12-01 (×3): 10 mg via ORAL
  Filled 2017-11-29 (×3): qty 1

## 2017-11-29 MED ORDER — LACTATED RINGERS IV SOLN
INTRAVENOUS | Status: DC | PRN
  Administered 2017-11-29 (×2): via INTRAVENOUS

## 2017-11-29 MED ORDER — VANCOMYCIN HCL 1000 MG IV SOLR
INTRAVENOUS | Status: DC | PRN
  Administered 2017-11-29: 1000 mg via TOPICAL

## 2017-11-29 MED ORDER — ROCURONIUM BROMIDE 100 MG/10ML IV SOLN
INTRAVENOUS | Status: DC | PRN
  Administered 2017-11-29: 50 mg via INTRAVENOUS
  Administered 2017-11-29: 10 mg via INTRAVENOUS

## 2017-11-29 MED ORDER — 0.9 % SODIUM CHLORIDE (POUR BTL) OPTIME
TOPICAL | Status: DC | PRN
  Administered 2017-11-29: 1000 mL

## 2017-11-29 MED ORDER — THROMBIN 20000 UNITS EX SOLR: CUTANEOUS | Status: DC | PRN

## 2017-11-29 MED ORDER — PHENYLEPHRINE HCL 10 MG/ML IJ SOLN
INTRAVENOUS | Status: DC | PRN
  Administered 2017-11-29: 40 ug/min via INTRAVENOUS

## 2017-11-29 MED ORDER — POTASSIUM CHLORIDE IN NACL 20-0.9 MEQ/L-% IV SOLN
INTRAVENOUS | Status: DC
  Administered 2017-11-29: 22:00:00 via INTRAVENOUS
  Filled 2017-11-29: qty 1000

## 2017-11-29 MED ORDER — VANCOMYCIN HCL 10 G IV SOLR
1250.0000 mg | Freq: Two times a day (BID) | INTRAVENOUS | Status: DC
  Administered 2017-11-29 – 2017-12-01 (×4): 1250 mg via INTRAVENOUS
  Filled 2017-11-29 (×4): qty 1250

## 2017-11-29 MED ORDER — ONDANSETRON HCL 4 MG/2ML IJ SOLN: 4.0000 mg | Freq: Four times a day (QID) | INTRAMUSCULAR | Status: DC | PRN

## 2017-11-29 MED ORDER — OXYCODONE HCL 5 MG PO TABS
5.0000 mg | ORAL_TABLET | Freq: Once | ORAL | Status: AC | PRN
  Administered 2017-11-29: 5 mg via ORAL

## 2017-11-29 MED ORDER — ACETAMINOPHEN 325 MG PO TABS
650.0000 mg | ORAL_TABLET | ORAL | Status: DC | PRN
  Administered 2017-11-30 – 2017-12-01 (×2): 650 mg via ORAL
  Filled 2017-11-29 (×2): qty 2

## 2017-11-29 MED ORDER — ROCURONIUM BROMIDE 50 MG/5ML IV SOLN
INTRAVENOUS | Status: AC
  Filled 2017-11-29: qty 2

## 2017-11-29 MED ORDER — ANASTROZOLE 1 MG PO TABS
1.0000 mg | ORAL_TABLET | Freq: Every day | ORAL | Status: DC
  Administered 2017-11-30 – 2017-12-02 (×3): 1 mg via ORAL
  Filled 2017-11-29 (×3): qty 1

## 2017-11-29 MED ORDER — ONDANSETRON HCL 4 MG/2ML IJ SOLN
INTRAMUSCULAR | Status: DC | PRN
  Administered 2017-11-29: 4 mg via INTRAVENOUS

## 2017-11-29 MED ORDER — OXYCODONE HCL 5 MG/5ML PO SOLN: 5.0000 mg | Freq: Once | ORAL | Status: AC | PRN

## 2017-11-29 MED ORDER — SENNA 8.6 MG PO TABS
1.0000 | ORAL_TABLET | Freq: Two times a day (BID) | ORAL | Status: DC
  Administered 2017-11-29 – 2017-12-02 (×4): 8.6 mg via ORAL
  Filled 2017-11-29 (×6): qty 1

## 2017-11-29 MED ORDER — PHENYLEPHRINE 40 MCG/ML (10ML) SYRINGE FOR IV PUSH (FOR BLOOD PRESSURE SUPPORT)
PREFILLED_SYRINGE | INTRAVENOUS | Status: DC | PRN
  Administered 2017-11-29: 160 ug via INTRAVENOUS
  Administered 2017-11-29: 80 ug via INTRAVENOUS

## 2017-11-29 MED ORDER — MIDAZOLAM HCL 2 MG/2ML IJ SOLN
INTRAMUSCULAR | Status: AC
  Filled 2017-11-29: qty 2

## 2017-11-29 MED ORDER — THROMBIN 5000 UNITS EX SOLR
CUTANEOUS | Status: AC
  Filled 2017-11-29: qty 5000

## 2017-11-29 MED ORDER — BUPIVACAINE HCL (PF) 0.25 % IJ SOLN
INTRAMUSCULAR | Status: AC
  Filled 2017-11-29: qty 30

## 2017-11-29 MED ORDER — THROMBIN 5000 UNITS EX SOLR
CUTANEOUS | Status: DC | PRN
  Administered 2017-11-29: 14:00:00 via TOPICAL

## 2017-11-29 MED ORDER — SUCCINYLCHOLINE CHLORIDE 200 MG/10ML IV SOSY
PREFILLED_SYRINGE | INTRAVENOUS | Status: AC
  Filled 2017-11-29: qty 10

## 2017-11-29 MED ORDER — FENTANYL CITRATE (PF) 100 MCG/2ML IJ SOLN
INTRAMUSCULAR | Status: AC
  Administered 2017-11-29: 50 ug via INTRAVENOUS
  Filled 2017-11-29: qty 2

## 2017-11-29 MED ORDER — METHOCARBAMOL 1000 MG/10ML IJ SOLN
500.0000 mg | Freq: Four times a day (QID) | INTRAVENOUS | Status: DC | PRN
  Filled 2017-11-29: qty 5

## 2017-11-29 MED ORDER — INSULIN ASPART 100 UNIT/ML ~~LOC~~ SOLN
0.0000 [IU] | Freq: Three times a day (TID) | SUBCUTANEOUS | Status: DC
  Administered 2017-11-30: 2 [IU] via SUBCUTANEOUS
  Administered 2017-11-30: 3 [IU] via SUBCUTANEOUS

## 2017-11-29 MED ORDER — FENTANYL CITRATE (PF) 100 MCG/2ML IJ SOLN
25.0000 ug | INTRAMUSCULAR | Status: DC | PRN
  Administered 2017-11-29 (×2): 50 ug via INTRAVENOUS

## 2017-11-29 MED ORDER — HEPARIN SODIUM (PORCINE) 1000 UNIT/ML IJ SOLN
INTRAMUSCULAR | Status: AC
  Filled 2017-11-29: qty 1

## 2017-11-29 MED ORDER — LACTATED RINGERS IV SOLN
Freq: Once | INTRAVENOUS | Status: AC
  Administered 2017-11-29: 11:00:00 via INTRAVENOUS

## 2017-11-29 MED ORDER — DULOXETINE HCL 20 MG PO CPEP
20.0000 mg | ORAL_CAPSULE | Freq: Two times a day (BID) | ORAL | Status: DC
  Filled 2017-11-29: qty 1

## 2017-11-29 MED ORDER — HYDROCODONE-ACETAMINOPHEN 5-325 MG PO TABS
1.0000 | ORAL_TABLET | Freq: Four times a day (QID) | ORAL | Status: DC | PRN
  Administered 2017-11-29 – 2017-12-02 (×9): 1 via ORAL
  Filled 2017-11-29 (×10): qty 1

## 2017-11-29 MED ORDER — OXYCODONE HCL 5 MG PO TABS
ORAL_TABLET | ORAL | Status: AC
  Administered 2017-11-29: 5 mg via ORAL
  Filled 2017-11-29: qty 1

## 2017-11-29 MED ORDER — HEPARIN SODIUM (PORCINE) 1000 UNIT/ML IJ SOLN
INTRAMUSCULAR | Status: DC | PRN
  Administered 2017-11-29: 5000 [IU]

## 2017-11-29 MED ORDER — ACETAMINOPHEN 325 MG PO TABS: 325.0000 mg | ORAL_TABLET | ORAL | Status: DC | PRN

## 2017-11-29 MED ORDER — CELECOXIB 200 MG PO CAPS
200.0000 mg | ORAL_CAPSULE | Freq: Two times a day (BID) | ORAL | Status: DC
  Administered 2017-11-30 – 2017-12-02 (×5): 200 mg via ORAL
  Filled 2017-11-29 (×6): qty 1

## 2017-11-29 MED ORDER — PHENOL 1.4 % MT LIQD: 1.0000 | OROMUCOSAL | Status: DC | PRN

## 2017-11-29 MED ORDER — MEPERIDINE HCL 50 MG/ML IJ SOLN: 6.2500 mg | INTRAMUSCULAR | Status: DC | PRN

## 2017-11-29 MED ORDER — ROCURONIUM BROMIDE 50 MG/5ML IV SOLN
INTRAVENOUS | Status: AC
  Filled 2017-11-29: qty 1

## 2017-11-29 MED ORDER — HYDROMORPHONE HCL 1 MG/ML IJ SOLN
0.5000 mg | INTRAMUSCULAR | Status: DC | PRN
  Administered 2017-11-29 – 2017-12-01 (×5): 0.5 mg via INTRAVENOUS
  Filled 2017-11-29 (×5): qty 1

## 2017-11-29 SURGICAL SUPPLY — 63 items
ADH SKN CLS APL DERMABOND .7 (GAUZE/BANDAGES/DRESSINGS) ×1
BAG DECANTER FOR FLEXI CONT (MISCELLANEOUS) ×2 IMPLANT
BASKET BONE COLLECTION (BASKET) IMPLANT
BENZOIN TINCTURE PRP APPL 2/3 (GAUZE/BANDAGES/DRESSINGS) IMPLANT
BLADE CLIPPER SURG (BLADE) IMPLANT
BUR MATCHSTICK NEURO 3.0 LAGG (BURR) ×2 IMPLANT
CANISTER SUCT 3000ML PPV (MISCELLANEOUS) ×2 IMPLANT
CARTRIDGE OIL MAESTRO DRILL (MISCELLANEOUS) ×1 IMPLANT
CONT SPEC 4OZ CLIKSEAL STRL BL (MISCELLANEOUS) ×2 IMPLANT
COVER BACK TABLE 60X90IN (DRAPES) ×2 IMPLANT
DERMABOND ADVANCED (GAUZE/BANDAGES/DRESSINGS) ×1
DERMABOND ADVANCED .7 DNX12 (GAUZE/BANDAGES/DRESSINGS) ×1 IMPLANT
DIFFUSER DRILL AIR PNEUMATIC (MISCELLANEOUS) ×2 IMPLANT
DRAPE C-ARM 42X72 X-RAY (DRAPES) IMPLANT
DRAPE LAPAROTOMY 100X72X124 (DRAPES) ×2 IMPLANT
DRAPE POUCH INSTRU U-SHP 10X18 (DRAPES) ×2 IMPLANT
DRAPE SURG 17X23 STRL (DRAPES) ×2 IMPLANT
DRSG OPSITE POSTOP 4X8 (GAUZE/BANDAGES/DRESSINGS) ×2 IMPLANT
DURAPREP 26ML APPLICATOR (WOUND CARE) ×2 IMPLANT
ELECT BLADE 4.0 EZ CLEAN MEGAD (MISCELLANEOUS) ×2
ELECT REM PT RETURN 9FT ADLT (ELECTROSURGICAL) ×2
ELECTRODE BLDE 4.0 EZ CLN MEGD (MISCELLANEOUS) ×1 IMPLANT
ELECTRODE REM PT RTRN 9FT ADLT (ELECTROSURGICAL) ×1 IMPLANT
EVACUATOR 1/8 PVC DRAIN (DRAIN) ×2 IMPLANT
GAUZE SPONGE 4X4 16PLY XRAY LF (GAUZE/BANDAGES/DRESSINGS) IMPLANT
GLOVE BIO SURGEON STRL SZ7 (GLOVE) IMPLANT
GLOVE BIO SURGEON STRL SZ8 (GLOVE) ×4 IMPLANT
GLOVE BIOGEL PI IND STRL 7.0 (GLOVE) IMPLANT
GLOVE BIOGEL PI INDICATOR 7.0 (GLOVE)
GOWN STRL REUS W/ TWL LRG LVL3 (GOWN DISPOSABLE) ×1 IMPLANT
GOWN STRL REUS W/ TWL XL LVL3 (GOWN DISPOSABLE) ×5 IMPLANT
GOWN STRL REUS W/TWL 2XL LVL3 (GOWN DISPOSABLE) IMPLANT
GOWN STRL REUS W/TWL LRG LVL3 (GOWN DISPOSABLE) ×2
GOWN STRL REUS W/TWL XL LVL3 (GOWN DISPOSABLE) ×5
HEMOSTAT POWDER KIT SURGIFOAM (HEMOSTASIS) ×2 IMPLANT
KIT BASIN OR (CUSTOM PROCEDURE TRAY) ×2 IMPLANT
KIT BONE MARROW PROCESS ANGEL (KITS) IMPLANT
KIT BONE MRW ASP ANGEL CPRP (KITS) ×2 IMPLANT
KIT TURNOVER KIT B (KITS) ×2 IMPLANT
MATRIX STRIP NEOCORE 12C (Putty) ×1 IMPLANT
MILL MEDIUM DISP (BLADE) IMPLANT
NEEDLE HYPO 25X1 1.5 SAFETY (NEEDLE) ×2 IMPLANT
NS IRRIG 1000ML POUR BTL (IV SOLUTION) ×2 IMPLANT
OIL CARTRIDGE MAESTRO DRILL (MISCELLANEOUS) ×2
PACK LAMINECTOMY NEURO (CUSTOM PROCEDURE TRAY) ×2 IMPLANT
PAD ARMBOARD 7.5X6 YLW CONV (MISCELLANEOUS) ×6 IMPLANT
PUTTY DBM ALLOSYNC PURE 10CC (Putty) ×2 IMPLANT
ROD PC 5.5X65 TI ARSENAL (Rod) ×4 IMPLANT
SCREW CBX 6.5X35MM (Screw) ×4 IMPLANT
SCREW SET SPINAL ARSENAL 47127 (Screw) ×12 IMPLANT
SPONGE LAP 4X18 RFD (DISPOSABLE) IMPLANT
SPONGE SURGIFOAM ABS GEL 100 (HEMOSTASIS) ×2 IMPLANT
STAPLER SKIN PROX WIDE 3.9 (STAPLE) ×2 IMPLANT
STRIP CLOSURE SKIN 1/2X4 (GAUZE/BANDAGES/DRESSINGS) IMPLANT
STRIP MATRIX NEOCORE 12CC (Putty) ×1 IMPLANT
SUT VIC AB 0 CT1 18XCR BRD8 (SUTURE) ×1 IMPLANT
SUT VIC AB 0 CT1 8-18 (SUTURE) ×2
SUT VIC AB 2-0 CP2 18 (SUTURE) ×4 IMPLANT
SUT VIC AB 3-0 SH 8-18 (SUTURE) ×6 IMPLANT
TOWEL GREEN STERILE (TOWEL DISPOSABLE) ×2 IMPLANT
TOWEL GREEN STERILE FF (TOWEL DISPOSABLE) ×2 IMPLANT
TRAY FOLEY MTR SLVR 16FR STAT (SET/KITS/TRAYS/PACK) IMPLANT
WATER STERILE IRR 1000ML POUR (IV SOLUTION) ×2 IMPLANT

## 2017-11-29 NOTE — Progress Notes (Signed)
Pharmacy Antibiotic Note  Kelsey Wilson is a 57 y.o. female admitted on 11/29/2017 with surgical prophylaxis.    Plan: Vanc 1250 mg q12h starting at 2200 Monitor renal fx, lot drain, vt prn  Height: 5\' 3"  (160 cm) Weight: 285 lb 7.9 oz (129.5 kg) IBW/kg (Calculated) : 52.4  Temp (24hrs), Avg:97.7 F (36.5 C), Min:97.6 F (36.4 C), Max:97.9 F (36.6 C)  No results for input(s): WBC, CREATININE, LATICACIDVEN, VANCOTROUGH, VANCOPEAK, VANCORANDOM, GENTTROUGH, GENTPEAK, GENTRANDOM, TOBRATROUGH, TOBRAPEAK, TOBRARND, AMIKACINPEAK, AMIKACINTROU, AMIKACIN in the last 168 hours.  Estimated Creatinine Clearance: 100.7 mL/min (by C-G formula based on SCr of 0.79 mg/dL).    Allergies  Allergen Reactions  . Aleve [Naproxen] Nausea Only  . Compazine [Prochlorperazine] Other (See Comments)    Numbness of face and  lips  . Hydrocodone Itching and Other (See Comments)    High dose only  . Oxycodone Other (See Comments)    hallucinations  . Penicillins Nausea Only and Other (See Comments)    Has patient had a PCN reaction causing immediate rash, facial/tongue/throat swelling, SOB or lightheadedness with hypotension: No Has patient had a PCN reaction causing severe rash involving mucus membranes or skin necrosis: No Has patient had a PCN reaction that required hospitalization No Has patient had a PCN reaction occurring within the last 10 years: No If all of the above answers are "NO", then may proceed with Cephalosporin use.   Levester Fresh, PharmD, BCPS, BCCCP Clinical Pharmacist [phone]  Please check AMION for all [place] numbers  11/29/2017 8:30 PM

## 2017-11-29 NOTE — Transfer of Care (Signed)
Immediate Anesthesia Transfer of Care Note  Patient: Kelsey Wilson  Procedure(s) Performed: Posterior Lateral Fusion - Lumbar Four-Lumbar Five, removal and replacement of hardware, Laminectomy - Lumbar Four-Lumbar Five (N/A Back)  Patient Location: PACU  Anesthesia Type:General  Level of Consciousness: awake, drowsy and patient cooperative  Airway & Oxygen Therapy: Patient Spontanous Breathing and Patient connected to nasal cannula oxygen  Post-op Assessment: Report given to RN and Post -op Vital signs reviewed and stable  Post vital signs: Reviewed and stable  Last Vitals:  Vitals Value Taken Time  BP 140/100 11/29/2017  4:08 PM  Temp    Pulse 101 11/29/2017  4:21 PM  Resp 11 11/29/2017  4:21 PM  SpO2 100 % 11/29/2017  4:21 PM  Vitals shown include unvalidated device data.  Last Pain:  Vitals:   11/29/17 1607  TempSrc:   PainSc: 0-No pain      Patients Stated Pain Goal: 3 (49/20/10 0712)  Complications: No apparent anesthesia complications

## 2017-11-29 NOTE — Anesthesia Preprocedure Evaluation (Addendum)
Anesthesia Evaluation  Patient identified by MRN, date of birth, ID band Patient awake    Reviewed: Allergy & Precautions, NPO status , Patient's Chart, lab work & pertinent test results  Airway Mallampati: II  TM Distance: >3 FB Neck ROM: Full    Dental  (+) Dental Advisory Given, Poor Dentition   Pulmonary shortness of breath and with exertion, sleep apnea ,    Pulmonary exam normal breath sounds clear to auscultation       Cardiovascular + Peripheral Vascular Disease  Normal cardiovascular exam Rhythm:Regular Rate:Normal   03/29/16 Stress Test: Nuclear stress EF: 64%. There was no ST segment deviation noted during stress. Defect 1: There is a small defect of moderate severity present in the apex location. The study is normal. The left ventricular ejection fraction is normal (55-65%).   Normal stress nuclear study with mild apical thinning; no ischemia or infarction; EF 64 with normal wall motion.   Neuro/Psych Anxiety Depression  Neuromuscular disease    GI/Hepatic Neg liver ROS, GERD  Medicated,  Endo/Other  Morbid obesity  Renal/GU negative Renal ROS     Musculoskeletal  (+) Arthritis , Osteoarthritis,  Fibromyalgia -  Abdominal   Peds  Hematology  (+) Blood dyscrasia, anemia ,   Anesthesia Other Findings Day of surgery medications reviewed with the patient.  Reproductive/Obstetrics                          Anesthesia Physical  Anesthesia Plan  ASA: III  Anesthesia Plan: General   Post-op Pain Management:    Induction: Intravenous  PONV Risk Score and Plan: 3 and Ondansetron, Midazolam, Dexamethasone and Treatment may vary due to age or medical condition  Airway Management Planned: Oral ETT  Additional Equipment:   Intra-op Plan:   Post-operative Plan: Extubation in OR  Informed Consent: I have reviewed the patients History and Physical, chart, labs and discussed  the procedure including the risks, benefits and alternatives for the proposed anesthesia with the patient or authorized representative who has indicated his/her understanding and acceptance.   Dental advisory given  Plan Discussed with: CRNA, Anesthesiologist and Surgeon  Anesthesia Plan Comments: (Risks/benefits of general anesthesia discussed with patient including risk of damage to teeth, lips, gum, and tongue, nausea/vomiting, allergic reactions to medications, and the possibility of heart attack, stroke and death.  All patient questions answered.  Patient wishes to proceed.)        Anesthesia Quick Evaluation

## 2017-11-29 NOTE — Op Note (Signed)
11/29/2017  3:38 PM  PATIENT:  Kelsey Wilson  57 y.o. 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 Re-do Decompressive lumbar laminectomy L4-5  2. Posterior fixation L3-L5 inclusive using Alphatec cortical pedicle screws with removal and reinsertion of instrumentation 3. Intertransverse arthrodesis L4-5 using morcellized autograft and allograft soaked with bone marrow aspirate obtained through a separate fascial incision over the right iliac crest.  SURGEON:  Sherley Bounds, MD  ASSISTANTS: Dr. Annette Stable  ANESTHESIA:  General  EBL: 300 ml  Total I/O In: 1600 [I.V.:1600] Out: 700 [Urine:400; Blood:300]  BLOOD ADMINISTERED:none  DRAINS: none   INDICATION FOR PROCEDURE: This patient presented with severe back and leg pain. 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 The patient tried a reasonable attempt at conservative medical measures without relief. I recommended decompression and instrumented fusion to address the stenosis as well as the segmental  instability.  Patient understood the risks, benefits, and alternatives and potential outcomes and wished to proceed.  PROCEDURE DETAILS:  The patient was brought to the operating room. After induction of generalized endotracheal anesthesia the patient was rolled into the prone position on chest rolls and all pressure points were padded. The patient's lumbar region was cleaned and then prepped with DuraPrep and draped in the usual sterile fashion. Anesthesia was injected and then a dorsal midline incision was made and carried down to the lumbosacral fascia. The fascia was opened and the paraspinous musculature was taken down in a subperiosteal fashion to expose the previously placed hardware. A self-retaining retractor was placed. Intraoperative fluoroscopy  confirmed my level, and I started with exposure of the transverse processes of L4-L5. The old hardware was removed bilaterally removing the locking caps and the rods and then the L5 screws were removed. There was good purchase of the L3 and L4 screws. I dissected in a suprafascial plane to expose the right iliac crests, the fascia was opened over the crest and a Jamshidi needle was used to extract 60 mL of bone marrow aspirate which was then spun down and soaked on 10 mL of cortical chips for later arthrodesis. The fascia was closed. I then turned my attention to the decompression and complete lumbar laminectomies, hemi- facetectomies, and foraminotomies were performed at L4-5. There was significant scar tissue and regrowth of facet bilaterally and significant compression of the L5 nerve roots bilaterally and the central canal at the top of L5. Much more generous decompression and generous foraminotomy was undertaken in order to adequately decompress the neural elements and address the patient's leg pain. The yellow ligament was removed to expose the underlying dura and nerve roots, and generous foraminotomies were performed to adequately decompress the neural elements. Both the exiting and traversing nerve roots were decompressed on both sides until a coronary dilator passed easily along the nerve roots. Once the decompression was complete I replaced the L5 pedicle screws with 6.5 x 40 mm cortical pedicle screws. We then decorticated the transverse processes and laid a mixture of morcellized autograft and allograft out over these to perform intertransverse arthrodesis at L4-5 bilaterally. We then placed lordotic rods into the multiaxial screw heads of the pedicle screws and locked these in position with the locking caps and anti-torque device. We then checked our construct with AP and lateral fluoroscopy. Irrigated with copious amounts of bacitracin-containing saline solution. Inspected the nerve roots  once again  to assure adequate decompression, lined to the dura with Gelfoam, placed powdered vancomycin into the wound, and closed the muscle and the fascia with 0 Vicryl. Closed the subcutaneous tissues with 2-0 Vicryl and subcuticular tissues with 3-0 Vicryl. The skin was closed with benzoin and Steri-Strips. Dressing was then applied, the patient was awakened from general anesthesia and transported to the recovery room in stable condition. At the end of the procedure all sponge, needle and instrument counts were correct.   PLAN OF CARE: admit to inpatient  PATIENT DISPOSITION:  PACU - hemodynamically stable.   Delay start of Pharmacological VTE agent (>24hrs) due to surgical blood loss or risk of bleeding:  yes

## 2017-11-29 NOTE — H&P (Signed)
Subjective: Patient is a 57 y.o. female admitted for pseudoarthrosis l4-5. Onset of symptoms was a few months ago, gradually worsening since that time.  The pain is rated severe, and is located at the across the lower back and radiates to legs. The pain is described as aching and occurs all day. The symptoms have been progressive. Symptoms are exacerbated by exercise. MRI or CT showed pseudoarthrosis l4-5   Past Medical History:  Diagnosis Date  . Anemia   . Anxiety   . Arthritis   . Breast cancer ([facility]) 2010   T3N1 invasive ductal carcinoma left breast.Takes Arimidex daily  . Bursitis   . Carpal tunnel syndrome   . Chronic back pain    stenosis  . Constipation    takes Colace daily  . Depression    takes Benzotropine daily  . Diverticulitis of colon   . Dyspnea    daily when walking for over 1 yr.  . Fibromyalgia 08/2012  . GERD (gastroesophageal reflux disease)    takes Dexilant daily  . Hemorrhoid   . History of blood transfusion    no abnormal reaction noted  . History of colon polyps    benign  . History of shingles   . Joint pain   . Joint swelling   . Night muscle spasms    takes Flexeril nightly as needed  . Nocturia   . OSA (obstructive sleep apnea)   . OSA on CPAP   . Peripheral edema    takes Furosemide.Just started 01/18/16  . Peripheral neuropathy    takes Lyrica daily  . Personal history of chemotherapy    2013  . Personal history of radiation therapy    2013  . Pneumonia    hx of-2015  . Pseudoarthrosis of lumbar spine   . Seasonal allergies    takes Singulair nightly  . SOB (shortness of breath) on exertion    rarely with exertion  . 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 Alphonzo Grieve)    Past Surgical History:  Procedure Laterality Date  . ABDOMINAL HYSTERECTOMY     still has ovaries  . APPENDECTOMY    . AXILLARY LYMPH NODE  DISSECTION  11/28/2011   Procedure: AXILLARY LYMPH NODE DISSECTION;  Surgeon: Edward Jolly, MD;  Location: [facility];  Service: General;  Laterality: Left;  . BREAST LUMPECTOMY Left 11/28/2011   Malignant  . BREAST SURGERY Left 2013  . CARPAL TUNNEL RELEASE     Bilateral  . CESAREAN SECTION     pt. has had 3  . CHOLECYSTECTOMY    . COLONOSCOPY    . EYE SURGERY Bilateral    cataract removal  . KNEE SURGERY     Left Knee  . LAMINECTOMY WITH POSTERIOR LATERAL ARTHRODESIS LEVEL 2 N/A 06/07/2017   Procedure: Posterior Lateral Fusion Lumbar Three-Four and Transforaminal Interbody Fusion Lumbar Four-Five with Segmental  Pedicle Screw Fixation;  Surgeon: Eustace Moore, MD;  Location: [facility];  Service: Neurosurgery;  Laterality: N/A;  Posterior Lateral Fusion Lumbar Three-Four and Transforaminal Interbody Fusion Lumbar Four-Five with Segmental  Pedicle Screw Fixation   . LUMBAR FUSION  06/07/2017   POST  . LUMBAR LAMINECTOMY/DECOMPRESSION MICRODISCECTOMY Bilateral 01/27/2016   Procedure: Laminectomy and Foraminotomy - Lumbar four -Lumbar five - bilateral- on-lay noninstrumented fusion;  Surgeon: Eustace Moore, MD;  Location: [facility];  Service: Neurosurgery;  Laterality: Bilateral;  .  MULTIPLE EXTRACTIONS WITH ALVEOLOPLASTY N/A 05/11/2016   Procedure: EXTRACTION OF TEETH EIGHTEEN, TWENTY AND TWENTY- NINE;  REMOVAL OF MANDIBULAR TORUS AND EXOSTOSIS;  Surgeon: Diona Browner, DDS;  Location: [facility];  Service: Oral Surgery;  Laterality: N/A;  . PORTACATH PLACEMENT  06/18/2011   Procedure: INSERTION PORT-A-CATH;  Surgeon: Edward Jolly, MD;  Location: [facility];  Service: General;  Laterality: Right;  right subclavian  . removal portacath  2014  . spur     Apex spur on both big toes  . TOE SURGERY Bilateral   . TONSILLECTOMY    . TOTAL KNEE ARTHROPLASTY Left 09/13/2014  . TOTAL KNEE ARTHROPLASTY Left 09/13/2014   Procedure: LEFT TOTAL KNEE ARTHROPLASTY;  Surgeon: Rod Can,  MD;  Location: [facility];  Service: Orthopedics;  Laterality: Left;  . TOTAL KNEE ARTHROPLASTY Right 03/10/2015   Procedure: RIGHT TOTAL KNEE ARTHROPLASTY;  Surgeon: Rod Can, MD;  Location: [facility];  Service: Orthopedics;  Laterality: Right;    Prior to Admission medications   Medication Sig Start Date End Date Taking? Authorizing Provider  anastrozole (ARIMIDEX) 1 MG tablet TAKE 1 TABLET BY MOUTH ONCE DAILY 09/25/17  Yes Causey, Charlestine Massed, NP  benzonatate (TESSALON) 100 MG capsule Take 100 mg by mouth 4 (four) times daily as needed for cough.    Yes [provider]  Dexlansoprazole (DEXILANT) 30 MG capsule Take 30 mg by mouth daily.   Yes [provider]  DULoxetine (CYMBALTA) 60 MG capsule Take 60 mg by mouth once daily 06/28/16  Yes [provider]  HYDROcodone-acetaminophen (NORCO/VICODIN) 5-325 MG tablet Take 1 tablet by mouth every 6 (six) hours as needed for moderate pain or severe pain. 11/15/17  Yes Kirsteins, Luanna Salk, MD  levocetirizine (XYZAL) 5 MG tablet Take 5 mg by mouth daily.   Yes [provider]  methocarbamol (ROBAXIN) 500 MG tablet Take 1 tablet (500 mg total) by mouth 2 (two) times daily as needed for muscle spasms. 10/22/17  Yes Bayard Hugger, NP  montelukast (SINGULAIR) 10 MG tablet Take 10 mg by mouth at bedtime.   Yes [provider]  ondansetron (ZOFRAN) 4 MG tablet Take 4 mg by mouth every 8 (eight) hours as needed for nausea or vomiting.   Yes [provider]  pregabalin (LYRICA) 100 MG capsule Take 1 capsule (100 mg total) by mouth 3 (three) times daily. 08/12/17  Yes Kirsteins, Luanna Salk, MD  Valbenazine Tosylate [facility]) 40 MG CAPS Take 40 mg by mouth daily.   Yes [provider]  clindamycin (CLEOCIN) 300 MG capsule Take 600 mg by mouth See admin instructions. Take 600 mg by mouth 1 hour prior to dental procedures 02/15/16   [provider]  promethazine (PHENERGAN) 12.5 MG suppository  Place 1 suppository (12.5 mg total) rectally every 12 (twelve) hours as needed for nausea or vomiting. 10/16/17   Willia Craze, NP   Allergies  Allergen Reactions  . Aleve [Naproxen] Nausea Only  . Compazine [Prochlorperazine] Other (See Comments)    Numbness of face and  lips  . Hydrocodone Itching and Other (See Comments)    High dose only  . Oxycodone Other (See Comments)    hallucinations  . Penicillins Nausea Only and Other (See Comments)    Has patient had a PCN reaction causing immediate rash, facial/tongue/throat swelling, SOB or lightheadedness with hypotension: No Has patient had a PCN reaction causing severe rash involving mucus membranes or skin necrosis: No Has patient had a  PCN reaction that required hospitalization No Has patient had a PCN reaction occurring within the last 10 years: No If all of the above answers are "NO", then may proceed with Cephalosporin use.    Social History   Tobacco Use  . Smoking status: Never Smoker  . Smokeless tobacco: Never Used  Substance Use Topics  . Alcohol use: No    Alcohol/week: 0.0 oz    Family History  Problem Relation Age of Onset  . Hypertension Mother   . Diabetes Mother   . Hypertension Father   . Diabetes Father   . Cancer Paternal Grandmother        unknown  . Colon cancer Neg Hx      Review of Systems  Positive ROS: neg  All other systems have been reviewed and were otherwise negative with the exception of those mentioned in the HPI and as above.  Objective: Vital signs in last 24 hours: Temp:  [97.9 F (36.6 C)] 97.9 F (36.6 C) (06/28 1033) Pulse Rate:  [90-102] 90 (06/28 1052) Resp:  [20] 20 (06/28 1033) BP: (144)/(81) 144/81 (06/28 1033) SpO2:  [96 %] 96 % (06/28 1052) Weight:  [127.9 kg (282 lb)] 127.9 kg (282 lb) (06/28 1033)  General Appearance: Alert, cooperative, no distress, appears stated age Head: Normocephalic, without obvious abnormality, atraumatic Eyes: PERRL, conjunctiva/corneas  clear, EOM's intact    Neck: Supple, symmetrical, trachea midline Back: Symmetric, no curvature, ROM normal, no CVA tenderness Lungs:  respirations unlabored Heart: Regular rate and rhythm Abdomen: Soft, non-tender Extremities: Extremities normal, atraumatic, no cyanosis or edema Pulses: 2+ and symmetric all extremities Skin: Skin color, texture, turgor normal, no rashes or lesions  NEUROLOGIC:   Mental status: Alert and oriented x4,  no aphasia, good attention span, fund of knowledge, and memory Motor Exam - grossly normal Sensory Exam - grossly normal Reflexes: 1= Coordination - grossly normal Gait - grossly normal Balance - grossly normal Cranial Nerves: I: smell Not tested  II: visual acuity  OS: nl    OD: nl  II: visual fields Full to confrontation  II: pupils Equal, round, reactive to light  III,VII: ptosis None  III,IV,VI: extraocular muscles  Full ROM  V: mastication Normal  V: facial light touch sensation  Normal  V,VII: corneal reflex  Present  VII: facial muscle function - upper  Normal  VII: facial muscle function - lower Normal  VIII: hearing Not tested  IX: soft palate elevation  Normal  IX,X: gag reflex Present  XI: trapezius strength  5/5  XI: sternocleidomastoid strength 5/5  XI: neck flexion strength  5/5  XII: tongue strength  Normal    Data Review Lab Results  Component Value Date   WBC 3.7 (L) 11/20/2017   HGB 12.0 11/20/2017   HCT 38.1 11/20/2017   MCV 85.4 11/20/2017   PLT 206 11/20/2017   Lab Results  Component Value Date   NA 140 11/20/2017   K 3.9 11/20/2017   CL 101 11/20/2017   CO2 28 11/20/2017   BUN 12 11/20/2017   CREATININE 0.79 11/20/2017   GLUCOSE 116 (H) 11/20/2017   Lab Results  Component Value Date   INR 0.91 11/20/2017    Assessment/Plan:  Estimated body mass index is 49.95 kg/m as calculated from the following:   Height as of this encounter: 5\' 3"  (1.6 m).   Weight as of this encounter: 127.9 kg (282  lb). Patient admitted for re-do L4-5 fusion. Patient has failed a reasonable attempt at  conservative therapy.  I explained the condition and procedure to the patient and answered any questions.  Patient wishes to proceed with procedure as planned. Understands risks/ benefits and typical outcomes of procedure.   Havyn Ramo S 11/29/2017 11:48 AM

## 2017-11-29 NOTE — Anesthesia Procedure Notes (Signed)
Procedure Name: Intubation Date/Time: 11/29/2017 12:55 PM Performed by: Carney Living, CRNA Pre-anesthesia Checklist: Patient identified, Emergency Drugs available, Suction available, Patient being monitored and Timeout performed Patient Re-evaluated:Patient Re-evaluated prior to induction Oxygen Delivery Method: Circle system utilized Preoxygenation: Pre-oxygenation with 100% oxygen Induction Type: IV induction Ventilation: Mask ventilation without difficulty and Oral airway inserted - appropriate to patient size Laryngoscope Size: Mac and 4 Grade View: Grade II Tube type: Oral Tube size: 7.0 mm Number of attempts: 1 Airway Equipment and Method: Stylet Placement Confirmation: ETT inserted through vocal cords under direct vision,  positive ETCO2 and breath sounds checked- equal and bilateral Secured at: 22 cm Tube secured with: Tape Dental Injury: Teeth and Oropharynx as per pre-operative assessment

## 2017-11-30 ENCOUNTER — Other Ambulatory Visit: Payer: Self-pay

## 2017-11-30 LAB — GLUCOSE, CAPILLARY
Glucose-Capillary: 153 mg/dL — ABNORMAL HIGH (ref 70–99)
Glucose-Capillary: 129 mg/dL — ABNORMAL HIGH (ref 70–99)
Glucose-Capillary: 160 mg/dL — ABNORMAL HIGH (ref 70–99)

## 2017-11-30 NOTE — Progress Notes (Signed)
Postop day 1.  Patient complains of back pain.  Still having some numbness and tingling in her left lateral thigh and anterior leg.  No new symptoms of weakness.  No radicular pain.  Afebrile.  Vital signs are stable.  Awake and alert.  Oriented and appropriate.  Motor and sensory exam stable.  Dressing dry.  Progressing well following lumbar decompression and fusion surgery.  Continue efforts at mobilization.  Possible discharge tomorrow.

## 2017-11-30 NOTE — Progress Notes (Signed)
PT Cancellation Note  Patient Details Name: Kelsey Wilson MRN: [id number] DOB: 03/10/1961   Cancelled Treatment:    Reason Eval/Treat Not Completed: Other (comment) patient reports just finishing with OT will like to hold on PT at this time.   Duncan Dull 11/30/2017, 11:34 AM

## 2017-11-30 NOTE — Evaluation (Signed)
Physical Therapy Evaluation Patient Details Name: Kelsey Wilson MRN: [id number] DOB: [birth year] Today's Date: 59/59/2019   History of Present Illness  This 57 y.o. female admitted for pseudoarthrosis L 4-5.   She underswent Re - do decompressive lumbar laminectomy L 4-5; poserioer fixation L3-5.  PMH includes:  peripheral neuropathy, OSA on CPAP, fibromyalgia, chronic back pain, h/o breast CA, s/p bil TKA, s/p lumbar fusion 1/19  Clinical Impression  Orders received for PT evaluation. Patient demonstrates deficits in functional mobility as indicated below. Will benefit from continued skilled PT to address deficits and maximize function. Will see as indicated and progress as tolerated.      Follow Up Recommendations No PT follow up;Supervision - Intermittent    Equipment Recommendations  Rolling walker with 5" wheels    Recommendations for Other Services       Precautions / Restrictions Precautions Precautions: Back Precaution Booklet Issued: No Precaution Comments: Pt able to recall precautions from previous surgery  Required Braces or Orthoses: (No brace needed )      Mobility  Bed Mobility Overal bed mobility: Needs Assistance Bed Mobility: Rolling;Sidelying to Sit;Sit to Sidelying Rolling: Supervision Sidelying to sit: Min assist     Sit to sidelying: Mod assist;HOB elevated General bed mobility comments: Min assist to elevate trunk to upright position at EOB, increased time and effort to perform. Moderate assist to elevate LEs upon return to bed  Transfers Overall transfer level: Needs assistance Equipment used: Rolling walker (2 wheeled) Transfers: Sit to/from Stand Sit to Stand: Min guard Stand pivot transfers: Min guard       General transfer comment: Min guard for safety, VCs for hand placement and positioning  Ambulation/Gait Ambulation/Gait assistance: Supervision Gait Distance (Feet): 190 Feet Assistive device: Rolling walker (2 wheeled) Gait  Pattern/deviations: Step-through pattern;Decreased stride length;Trunk flexed Gait velocity: decreased Gait velocity interpretation: <1.8 ft/sec, indicate of risk for recurrent falls General Gait Details: reliance on RW for stability during ambulation. VCs for increased cadence. Overall steady with ambulation  Stairs            Wheelchair Mobility    Modified Rankin (Stroke Patients Only)       Balance Overall balance assessment: Mild deficits observed, not formally tested                                           Pertinent Vitals/Pain Pain Assessment: 0-10 Pain Score: 8  Pain Location: back  Pain Descriptors / Indicators: Aching;Operative site guarding Pain Intervention(s): Monitored during session;Patient requesting pain meds-RN notified    Home Living Family/patient expects to be discharged to:: Private residence Living Arrangements: Children Available Help at Discharge: Family;Available 24 hours/day Type of Home: Apartment Home Access: Level entry     Home Layout: One level Home Equipment: Cane - single point;Walker - 2 wheels Additional Comments: Pt lives with daughter and 7 grandchildren.  She reports she got a RW at time of last lumbar fusion, and the wheels are falling off     Prior Function Level of Independence: Independent         Comments: Performing ADLs and IADLs     Hand Dominance   Dominant Hand: Right    Extremity/Trunk Assessment   Upper Extremity Assessment Upper Extremity Assessment: Overall WFL for tasks assessed    Lower Extremity Assessment Lower Extremity Assessment: Generalized weakness       Communication  Communication: No difficulties  Cognition Arousal/Alertness: Awake/alert Behavior During Therapy: WFL for tasks assessed/performed Overall Cognitive Status: Within Functional Limits for tasks assessed                                        General Comments      Exercises      Assessment/Plan    PT Assessment Patient needs continued PT services  PT Problem List Decreased strength;Decreased balance;Decreased activity tolerance;Decreased mobility;Pain       PT Treatment Interventions DME instruction;Gait training;Stair training;Therapeutic activities;Functional mobility training;Therapeutic exercise;Balance training;Neuromuscular re-education;Patient/family education    PT Goals (Current goals can be found in the Care Plan section)  Acute Rehab PT Goals Patient Stated Goal: to go back home and be able to care for self  PT Goal Formulation: With patient Time For Goal Achievement: 12/14/17 Potential to Achieve Goals: Good    Frequency Min 5X/week   Barriers to discharge        Co-evaluation               AM-PAC PT "6 Clicks" Daily Activity  Outcome Measure Difficulty turning over in bed (including adjusting bedclothes, sheets and blankets)?: A Little Difficulty moving from lying on back to sitting on the side of the bed? : A Little Difficulty sitting down on and standing up from a chair with arms (e.g., wheelchair, bedside commode, etc,.)?: A Little Help needed moving to and from a bed to chair (including a wheelchair)?: A Little Help needed walking in hospital room?: A Little Help needed climbing 3-5 steps with a railing? : A Little 6 Click Score: 18    End of Session Equipment Utilized During Treatment: Gait belt Activity Tolerance: Patient tolerated treatment well Patient left: in bed;with call bell/phone within reach Nurse Communication: Mobility status PT Visit Diagnosis: Difficulty in walking, not elsewhere classified (R26.2)    Time: 0254-2706 PT Time Calculation (min) (ACUTE ONLY): 17 min   Charges:   PT Evaluation $PT Eval Moderate Complexity: 1 Mod     PT G Codes:        Alben Deeds, PT DPT  Board Certified Neurologic Specialist [place] 11/30/2017, 3:09 PM

## 2017-11-30 NOTE — Evaluation (Signed)
Occupational Therapy Evaluation Patient Details Name: Kelsey Wilson MRN: [id number] DOB: [birth year] Today's Date: 11/30/2017    History of Present Illness This 57 y.o. female admitted for pseudoarthrosis L 4-5.   She underswent Re - do decompressive lumbar laminectomy L 4-5; poserioer fixation L3-5.  PMH includes:  peripheral neuropathy, OSA on CPAP, fibromyalgia, chronic back pain, h/o breast CA, s/p bil TKA, s/p lumbar fusion 1/19   Clinical Impression   Pt admitted with above. She demonstrates the below listed deficits and will benefit from continued OT to maximize safety and independence with BADLs.  Pt presents to OT with generalized weakness, pain, decreased activity tolerance.  She currently requires mod A for LB ADLs, and min guard assist for functional mobility.  She has good recollection of back precautions.   She is very motivated. She lives with daughter, who will be home with her 24/7 for first two weeks post discharge.  PTA, pt was independent with ADLs.       Follow Up Recommendations  No OT follow up;Supervision/Assistance - 24 hour    Equipment Recommendations  None recommended by OT    Recommendations for Other Services       Precautions / Restrictions Precautions Precautions: Back Precaution Booklet Issued: No Precaution Comments: Pt able to recall precautions from previous surgery  Required Braces or Orthoses: (No brace needed )      Mobility Bed Mobility Overal bed mobility: Needs Assistance Bed Mobility: Sit to Sidelying         Sit to sidelying: Mod assist;HOB elevated General bed mobility comments: assist to lift LEs onto bed.  Pt able to recall how to log roll   Transfers Overall transfer level: Needs assistance Equipment used: Rolling walker (2 wheeled) Transfers: Sit to/from [facility] Sit to Stand: Min guard Stand pivot transfers: Min guard       General transfer comment: min guard for safety     Balance Overall  balance assessment: Mild deficits observed, not formally tested                                         ADL either performed or assessed with clinical judgement   ADL Overall ADL's : Needs assistance/impaired Eating/Feeding: Independent   Grooming: Wash/dry hands;Wash/dry face;Oral care;Brushing hair;Min guard;Standing Grooming Details (indicate cue type and reason): Pt able to recall safe technique for grooming  Upper Body Bathing: Set up;Supervision/ safety;Sitting   Lower Body Bathing: Moderate assistance;Sit to/from stand   Upper Body Dressing : Supervision/safety;Set up;Sitting   Lower Body Dressing: Maximal assistance;Sit to/from stand Lower Body Dressing Details (indicate cue type and reason): unable to access feet  Toilet Transfer: Min guard;Ambulation;Comfort height toilet;Grab bars;RW   Toileting- Clothing Manipulation and Hygiene: Moderate assistance;Sit to/from stand Toileting - Clothing Manipulation Details (indicate cue type and reason): difficulty accessing peri are without breaking precautions      Functional mobility during ADLs: Min guard;Rolling walker       Vision         Perception     Praxis      Pertinent Vitals/Pain Pain Assessment: 0-10 Pain Score: 7  Pain Location: back  Pain Descriptors / Indicators: Aching;Operative site guarding Pain Intervention(s): Monitored during session;Limited activity within patient's tolerance;RN gave pain meds during session     Hand Dominance Right   Extremity/Trunk Assessment Upper Extremity Assessment Upper Extremity Assessment: Overall WFL for tasks  assessed   Lower Extremity Assessment Lower Extremity Assessment: Defer to PT evaluation       Communication Communication Communication: No difficulties   Cognition Arousal/Alertness: Awake/alert Behavior During Therapy: WFL for tasks assessed/performed Overall Cognitive Status: Within Functional Limits for tasks assessed                                      General Comments       Exercises     Shoulder Instructions      Home Living Family/patient expects to be discharged to:: Private residence Living Arrangements: Children Available Help at Discharge: Family;Available 24 hours/day Type of Home: Apartment Home Access: Level entry     Home Layout: One level     Bathroom Shower/Tub: Tub/shower unit;Curtain   Biochemist, clinical: Standard     Home Equipment: Cane - single point;Walker - 2 wheels   Additional Comments: Pt lives with daughter and 7 grandchildren.  She reports she got a RW at time of last lumbar fusion, and the wheels are falling off       Prior Functioning/Environment Level of Independence: Independent        Comments: Performing ADLs and IADLs        OT Problem List: Decreased activity tolerance;Decreased knowledge of use of DME or AE;Decreased knowledge of precautions;Obesity;Pain      OT Treatment/Interventions: Self-care/ADL training;DME and/or AE instruction;Therapeutic activities;Patient/family education;Balance training    OT Goals(Current goals can be found in the care plan section) Acute Rehab OT Goals Patient Stated Goal: to go back home and be able to care for self  OT Goal Formulation: With patient Time For Goal Achievement: 12/14/17 Potential to Achieve Goals: Good ADL Goals Pt Will Perform Grooming: with modified independence;standing Pt Will Perform Lower Body Bathing: with supervision;with adaptive equipment;sit to/from stand Pt Will Perform Lower Body Dressing: with supervision;sit to/from stand;with adaptive equipment Pt Will Transfer to Toilet: with supervision;ambulating;regular height toilet;grab bars Pt Will Perform Toileting - Clothing Manipulation and hygiene: with supervision;sit to/from stand;with adaptive equipment Pt Will Perform Tub/Shower Transfer: Tub transfer;with min guard assist;ambulating;rolling walker  OT Frequency: Min 2X/week    Barriers to D/C:            Co-evaluation              AM-PAC PT "6 Clicks" Daily Activity     Outcome Measure Help from another person eating meals?: None Help from another person taking care of personal grooming?: A Little Help from another person toileting, which includes using toliet, bedpan, or urinal?: A Lot Help from another person bathing (including washing, rinsing, drying)?: A Lot Help from another person to put on and taking off regular upper body clothing?: A Little Help from another person to put on and taking off regular lower body clothing?: A Lot 6 Click Score: 16   End of Session Equipment Utilized During Treatment: Rolling walker;Back brace Nurse Communication: Mobility status  Activity Tolerance: Patient limited by pain Patient left: in bed;with call bell/phone within reach  OT Visit Diagnosis: Pain Pain - part of body: (back )                Time: 3557-3220 OT Time Calculation (min): 28 min Charges:  OT General Charges $OT Visit: 1 Visit OT Evaluation $OT Eval Moderate Complexity: 1 Mod OT Treatments $Self Care/Home Management : 8-22 mins G-Codes:     [facility], OTR/L [phone]  Ayerim Berquist M 11/30/2017, 1:25 PM

## 2017-12-01 LAB — GLUCOSE, CAPILLARY
Glucose-Capillary: 110 mg/dL — ABNORMAL HIGH (ref 70–99)
Glucose-Capillary: 119 mg/dL — ABNORMAL HIGH (ref 70–99)
Glucose-Capillary: 130 mg/dL — ABNORMAL HIGH (ref 70–99)
Glucose-Capillary: 126 mg/dL — ABNORMAL HIGH (ref 70–99)

## 2017-12-01 MED ORDER — DIPHENHYDRAMINE HCL 25 MG PO CAPS
25.0000 mg | ORAL_CAPSULE | Freq: Four times a day (QID) | ORAL | Status: DC | PRN
  Administered 2017-12-01 – 2017-12-02 (×4): 25 mg via ORAL
  Filled 2017-12-01 (×4): qty 1

## 2017-12-01 NOTE — Anesthesia Postprocedure Evaluation (Signed)
Anesthesia Post Note  Patient: Kelsey Wilson  Procedure(s) Performed: Posterior Lateral Fusion - Lumbar Four-Lumbar Five, removal and replacement of hardware, Laminectomy - Lumbar Four-Lumbar Five (N/A Back)     Patient location during evaluation: PACU Anesthesia Type: General Level of consciousness: sedated and patient cooperative Pain management: pain level controlled Vital Signs Assessment: post-procedure vital signs reviewed and stable Respiratory status: spontaneous breathing Cardiovascular status: stable Anesthetic complications: no    Last Vitals:  Vitals:   12/01/17 1900 12/01/17 2100  BP: 126/76 125/79  Pulse: 92 91  Resp:    Temp: 36.6 C 36.8 C  SpO2: 96% 99%    Last Pain:  Vitals:   12/01/17 2100  TempSrc: Oral  PainSc:                  Nolon Nations

## 2017-12-01 NOTE — Progress Notes (Signed)
Overall stable.  Patient complains of back pain.  Still with some left lower extremity numbness and tingling.  Afebrile.  Vital signs are stable.  Motor and sensory examination unchanged.  Dressings dry.  Abdomen soft.  Mobility still remains too poor for discharge home.  Continue efforts at pain control and increasing mobility.  Patient may require skilled nursing facility before discharge home.

## 2017-12-01 NOTE — Progress Notes (Signed)
Patient states she is "itching like a flea".  Wants benadryl for relief.  MD notified.

## 2017-12-01 NOTE — Progress Notes (Signed)
Occupational Therapy Treatment Patient Details Name: KEYRY IRACHETA MRN: [id number] DOB: [birth year] Today's Date: 12/01/2017    History of present illness This 57 y.o. female admitted for pseudoarthrosis L 4-5.   She underswent Re - do decompressive lumbar laminectomy L 4-5; poserioer fixation L3-5.  PMH includes:  peripheral neuropathy, OSA on CPAP, fibromyalgia, chronic back pain, h/o breast CA, s/p bil TKA, s/p lumbar fusion 1/19   OT comments  Pt. Making gains with skilled OT.  Able to complete bed mobility with min a. And toileting with min a.  Reports dtr. Will be with her and able to assist with all LB adls.  Note d/c likely later today.   Follow Up Recommendations  No OT follow up;Supervision/Assistance - 24 hour    Equipment Recommendations  None recommended by OT    Recommendations for Other Services      Precautions / Restrictions Precautions Precautions: Back Precaution Comments: Pt able to recall precautions from previous surgery        Mobility Bed Mobility Overal bed mobility: Needs Assistance Bed Mobility: Rolling;Sidelying to Sit Rolling: Supervision Sidelying to sit: Min assist       General bed mobility comments: Min assist to elevate trunk to upright position at EOB, increased time and effort to perform-HOB FLAT EXITS ON R SIDE, reports very high bed and uses a step stool  Transfers Overall transfer level: Needs assistance Equipment used: Rolling walker (2 wheeled) Transfers: Sit to/from [facility] Sit to Stand: Min guard Stand pivot transfers: Min guard            Balance                                           ADL either performed or assessed with clinical judgement   ADL Overall ADL's : Needs assistance/impaired     Grooming: Wash/dry hands;Supervision/safety;Standing         Lower Body Bathing Details (indicate cue type and reason): declines need for A/E, dtr will be assisting with LB ADLS       Lower Body Dressing Details (indicate cue type and reason): declines need for A/E, dtr will be assisting with LB ADLS.  Toilet Transfer: Min guard;Ambulation;Comfort height toilet;Grab bars;RW;BSC Toilet Transfer Details (indicate cue type and reason): 3n1 over the commode Toileting- Clothing Manipulation and Hygiene: Minimal assistance;Sit to/from stand Toileting - Clothing Manipulation Details (indicate cue type and reason): able to perform front pericare in standing, requires assistance with buttocks (reports dtr always assists with this, declines need for a/e)     Functional mobility during ADLs: Min guard;Rolling walker       Vision       Perception     Praxis      Cognition Arousal/Alertness: Awake/alert Behavior During Therapy: WFL for tasks assessed/performed Overall Cognitive Status: Within Functional Limits for tasks assessed                                          Exercises     Shoulder Instructions       General Comments      Pertinent Vitals/ Pain       Pain Assessment: 0-10 Pain Score: 9  Pain Location: back Pain Descriptors / Indicators: Aching;Operative site guarding  Home Living  Prior Functioning/Environment              Frequency  Min 2X/week        Progress Toward Goals  OT Goals(current goals can now be found in the care plan section)  Progress towards OT goals: Progressing toward goals     Plan      Co-evaluation                 AM-PAC PT "6 Clicks" Daily Activity     Outcome Measure   Help from another person eating meals?: None Help from another person taking care of personal grooming?: A Little Help from another person toileting, which includes using toliet, bedpan, or urinal?: A Lot Help from another person bathing (including washing, rinsing, drying)?: A Lot Help from another person to put on and taking off regular upper body  clothing?: A Little Help from another person to put on and taking off regular lower body clothing?: A Lot 6 Click Score: 16    End of Session Equipment Utilized During Treatment: Gait belt;Rolling walker  OT Visit Diagnosis: Pain   Activity Tolerance Patient tolerated treatment well   Patient Left Other (comment)(ambulating with PT)   Nurse Communication          Time: 4496-7591 OT Time Calculation (min): 10 min  Charges: OT General Charges $OT Visit: 1 Visit OT Treatments $Self Care/Home Management : 8-22 mins  Janice Coffin, COTA/L 12/01/2017, 8:44 AM

## 2017-12-01 NOTE — Progress Notes (Addendum)
Physical Therapy Treatment Patient Details Name: Kelsey Wilson MRN: [id number] DOB: 05/06/1961 Today's Date: 12/01/2017    History of Present Illness This 57 y.o. female admitted for pseudoarthrosis L 4-5.   She underswent Re - do decompressive lumbar laminectomy L 4-5; poserioer fixation L3-5.  PMH includes:  peripheral neuropathy, OSA on CPAP, fibromyalgia, chronic back pain, h/o breast CA, s/p bil TKA, s/p lumbar fusion 1/19    PT Comments    Patient seen for activity progression. Mobilizing well. Anticipate patient will be safe for d/c home with daughter assist. Encouraged continued mobility during hospital course.    Follow Up Recommendations  No PT follow up;Supervision - Intermittent     Equipment Recommendations  Rolling walker with 5" wheels    Recommendations for Other Services       Precautions / Restrictions Precautions Precautions: Back Precaution Comments: Pt able to recall precautions from previous surgery     Mobility  Bed Mobility  Transfers                 General transfer comment: received OOB with OT therapist  Ambulation/Gait Ambulation/Gait assistance: Supervision Gait Distance (Feet): 300 Feet Assistive device: Rolling walker (2 wheeled) Gait Pattern/deviations: Step-through pattern;Decreased stride length;Trunk flexed Gait velocity: decreased Gait velocity interpretation: <1.8 ft/sec, indicate of risk for recurrent falls General Gait Details: Improved cadence today   Stairs             Wheelchair Mobility    Modified Rankin (Stroke Patients Only)       Balance Overall balance assessment: Mild deficits observed, not formally tested                                          Cognition Arousal/Alertness: Awake/alert Behavior During Therapy: WFL for tasks assessed/performed Overall Cognitive Status: Within Functional Limits for tasks assessed                                         Exercises      General Comments        Pertinent Vitals/Pain Pain Assessment: 0-10 Pain Score: 9  Pain Location: back Pain Descriptors / Indicators: Aching;Operative site guarding    Home Living                      Prior Function            PT Goals (current goals can now be found in the care plan section) Acute Rehab PT Goals PT Goal Formulation: With patient Time For Goal Achievement: 12/14/17 Potential to Achieve Goals: Good Progress towards PT goals: Progressing toward goals    Frequency    Min 5X/week      PT Plan Current plan remains appropriate    Co-evaluation              AM-PAC PT "6 Clicks" Daily Activity  Outcome Measure  Difficulty turning over in bed (including adjusting bedclothes, sheets and blankets)?: A Little Difficulty moving from lying on back to sitting on the side of the bed? : A Little Difficulty sitting down on and standing up from a chair with arms (e.g., wheelchair, bedside commode, etc,.)?: A Little Help needed moving to and from a bed to chair (including a wheelchair)?: A Little Help needed walking  in hospital room?: A Little Help needed climbing 3-5 steps with a railing? : A Little 6 Click Score: 18    End of Session Equipment Utilized During Treatment: Gait belt Activity Tolerance: Patient tolerated treatment well Patient left: in chair;with call bell/phone within reach Nurse Communication: Mobility status PT Visit Diagnosis: Difficulty in walking, not elsewhere classified (R26.2)     Time: 6010-9323 PT Time Calculation (min) (ACUTE ONLY): 16 min  Charges:  $Gait Training: 8-22 mins                    G Codes:       Alben Deeds, PT DPT  Board Certified Neurologic Specialist [place] 12/01/2017, 8:59 AM

## 2017-12-02 ENCOUNTER — Encounter (HOSPITAL_COMMUNITY): Payer: Self-pay | Admitting: Neurological Surgery

## 2017-12-02 LAB — GLUCOSE, CAPILLARY
Glucose-Capillary: 123 mg/dL — ABNORMAL HIGH (ref 70–99)
Glucose-Capillary: 125 mg/dL — ABNORMAL HIGH (ref 70–99)

## 2017-12-02 MED ORDER — HYDROCODONE-ACETAMINOPHEN 5-325 MG PO TABS: 1.0000 | ORAL_TABLET | Freq: Four times a day (QID) | ORAL | 0 refills | Status: DC | PRN

## 2017-12-02 MED ORDER — METHOCARBAMOL 500 MG PO TABS: 500.0000 mg | ORAL_TABLET | Freq: Four times a day (QID) | ORAL | 2 refills | Status: DC | PRN

## 2017-12-02 NOTE — Social Work (Signed)
CSW acknowledging consult for SNF- per therapy notes pt progressing well and able to safely discharge home.  CSW signing off. Please consult if any additional needs arise.  [place], [place] [facility] [phone]

## 2017-12-02 NOTE — Discharge Summary (Signed)
Physician Discharge Summary  Patient ID: ALEXYSS BALZARINI MRN: [id number] DOB/AGE: 1960-10-01 57 y.o.  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 The patient tolerated the procedure well and was taken to the recovery room and then to the floor in stable condition. The hospital course was routine. There were no complications. The wound remained clean dry and intact. Pt had appropriate back soreness. No complaints of leg pain or new N/T/W. The patient remained afebrile with stable vital signs, and tolerated a regular diet. The patient continued to increase activities, and pain was well controlled with oral pain medications.   Consults: None  Significant Diagnostic Studies:  Results for orders placed or performed during the hospital encounter of 11/29/17  Glucose, capillary  Result Value Ref Range   Glucose-Capillary 235 (H) 70 - 99 mg/dL  Glucose, capillary  Result Value Ref Range   Glucose-Capillary 153 (H) 70 - 99 mg/dL  Glucose, capillary  Result Value Ref Range   Glucose-Capillary 129 (H) 70 - 99 mg/dL  Glucose, capillary  Result Value Ref Range   Glucose-Capillary 160 (H) 70 - 99 mg/dL  Glucose, capillary  Result Value Ref Range   Glucose-Capillary 110 (H) 70 - 99 mg/dL  Glucose, capillary  Result Value Ref Range   Glucose-Capillary 119 (H) 70 - 99 mg/dL  Glucose, capillary  Result Value Ref Range   Glucose-Capillary 130 (H) 70 - 99 mg/dL  Glucose, capillary  Result Value Ref Range   Glucose-Capillary 126 (H) 70 - 99 mg/dL  Glucose, capillary  Result Value Ref Range   Glucose-Capillary 123 (H) 70 - 99 mg/dL   Comment 1 Notify RN    Comment 2 Document in Chart   Glucose, capillary  Result Value  Ref Range   Glucose-Capillary 125 (H) 70 - 99 mg/dL    Dg Lumbar Spine 2-3 Views  Result Date: 11/29/2017 CLINICAL DATA:  History of lumbar fusion. Fluoroscopy time was 6 seconds. Removal and replacement of hardware. EXAM: DG C-ARM 61-120 MIN; LUMBAR SPINE - 2-3 VIEW COMPARISON:  CT myelogram 10/30/2017 FINDINGS: Two intraoperative fluoroscopic images of the spine were obtained. There is bilateral pedicle screw and rod fixation at L3, L4 and L5. Interbody devices at L3-L4 and L4-L5. IMPRESSION: Fluoroscopic images of the lumbar spine for surgery. Electronically Signed   By: Markus Daft M.D.   On: 11/29/2017 15:57   Dg C-arm 1-60 Min  Result Date: 11/29/2017 CLINICAL DATA:  History of lumbar fusion. Fluoroscopy time was 6 seconds. Removal and replacement of hardware. EXAM: DG C-ARM 61-120 MIN; LUMBAR SPINE - 2-3 VIEW COMPARISON:  CT myelogram 10/30/2017 FINDINGS: Two intraoperative fluoroscopic images of the spine were obtained. There is bilateral pedicle screw and rod fixation at L3, L4 and L5. Interbody devices at L3-L4 and L4-L5. IMPRESSION: Fluoroscopic images of the lumbar spine for surgery. Electronically Signed   By: Markus Daft M.D.   On: 11/29/2017 15:57    Antibiotics:  Anti-infectives (From admission, onward)   Start     Dose/Rate Route Frequency Ordered Stop   11/29/17 2200  vancomycin (VANCOCIN) 1,250 mg in sodium chloride 0.9 % 250 mL IVPB  Status:  Discontinued     1,250 mg 166.7 mL/hr over 90 Minutes Intravenous Every 12 hours 11/29/17 2030 12/01/17  1148   11/29/17 1349  vancomycin (VANCOCIN) powder  Status:  Discontinued       As needed 11/29/17 1350 11/29/17 1602   11/29/17 1343  bacitracin 50,000 Units in sodium chloride 0.9 % 500 mL irrigation  Status:  Discontinued       As needed 11/29/17 1344 11/29/17 1602   11/29/17 1000  vancomycin (VANCOCIN) 1,500 mg in sodium chloride 0.9 % 500 mL IVPB     1,500 mg 250 mL/hr over 120 Minutes Intravenous To [facility] 11/28/17  1408 11/29/17 1257      Discharge Exam: Blood pressure (!) 161/81, pulse 86, temperature 98.2 F (36.8 C), resp. rate (!) 21, height 5\' 3"  (1.6 m), weight 129.5 kg (285 lb 7.9 oz), SpO2 100 %. Neurologic: Grossly normal Dressing dry  Discharge Medications:   Allergies as of 12/02/2017      Reactions   Aleve [naproxen] Nausea Only   Compazine [prochlorperazine] Other (See Comments)   Numbness of face and  lips   Hydrocodone Itching, Other (See Comments)   High dose only   Oxycodone Other (See Comments)   hallucinations   Penicillins Nausea Only, Other (See Comments)   Has patient had a PCN reaction causing immediate rash, facial/tongue/throat swelling, SOB or lightheadedness with hypotension: No Has patient had a PCN reaction causing severe rash involving mucus membranes or skin necrosis: No Has patient had a PCN reaction that required hospitalization No Has patient had a PCN reaction occurring within the last 10 years: No If all of the above answers are "NO", then may proceed with Cephalosporin use.      Medication List    TAKE these medications   anastrozole 1 MG tablet Commonly known as:  ARIMIDEX TAKE 1 TABLET BY MOUTH ONCE DAILY   benzonatate 100 MG capsule Commonly known as:  TESSALON Take 100 mg by mouth 4 (four) times daily as needed for cough.   clindamycin 300 MG capsule Commonly known as:  CLEOCIN Take 600 mg by mouth See admin instructions. Take 600 mg by mouth 1 hour prior to dental procedures   DEXILANT 30 MG capsule Generic drug:  Dexlansoprazole Take 30 mg by mouth daily.   DULoxetine 60 MG capsule Commonly known as:  CYMBALTA Take 60 mg by mouth once daily   HYDROcodone-acetaminophen 5-325 MG tablet Commonly known as:  NORCO/VICODIN Take 1 tablet by mouth every 6 (six) hours as needed for moderate pain or severe pain.   INGREZZA 40 MG Caps Generic drug:  Valbenazine Tosylate Take 40 mg by mouth daily.   levocetirizine 5 MG tablet Commonly  known as:  XYZAL Take 5 mg by mouth daily.   methocarbamol 500 MG tablet Commonly known as:  ROBAXIN Take 1 tablet (500 mg total) by mouth every 6 (six) hours as needed for muscle spasms. What changed:  when to take this   montelukast 10 MG tablet Commonly known as:  SINGULAIR Take 10 mg by mouth at bedtime.   ondansetron 4 MG tablet Commonly known as:  ZOFRAN Take 4 mg by mouth every 8 (eight) hours as needed for nausea or vomiting.   pregabalin 100 MG capsule Commonly known as:  LYRICA Take 1 capsule (100 mg total) by mouth 3 (three) times daily.   promethazine 12.5 MG suppository Commonly known as:  PHENERGAN Place 1 suppository (12.5 mg total) rectally every 12 (twelve) hours as needed for nausea or vomiting.            Durable Medical Equipment  (  From admission, onward)        Start     Ordered   11/29/17 2009  DME Walker rolling  Once    Question:  Patient needs a walker to treat with the following condition  Answer:  S/P lumbar fusion   11/29/17 2009   11/29/17 2009  DME 3 n 1  Once     11/29/17 2009   Unscheduled  For home use only DME Gilford Rile  [facility]  Once    Question:  Patient needs a walker to treat with the following condition  Answer:  Gait disturbance   12/02/17 1859      Disposition: home   Final Dx: re-do decompression and fusion L4-5  Discharge Instructions    Call MD for:  difficulty breathing, headache or visual disturbances   Complete by:  As directed    Call MD for:  persistant nausea and vomiting   Complete by:  As directed    Call MD for:  redness, tenderness, or signs of infection (pain, swelling, redness, odor or green/yellow discharge around incision site)   Complete by:  As directed    Call MD for:  severe uncontrolled pain   Complete by:  As directed    Call MD for:  temperature >100.4   Complete by:  As directed    Diet - low sodium heart healthy   Complete by:  As directed    Increase activity slowly   Complete by:  As  directed       Follow-up Information    Eustace Moore, MD. Schedule an appointment as soon as possible for a visit in 2 week(s).   Specialty:  Neurosurgery Contact information: [address] [place] [address] [place] [address] [phone]            Signed: Eustace Moore 12/02/2017, 6:59 PM

## 2017-12-02 NOTE — Progress Notes (Signed)
Physical Therapy Treatment Patient Details Name: Kelsey Wilson MRN: [id number] DOB: 1960/07/02 Today's Date: 12/02/2017    History of Present Illness This 57 y.o. female admitted for pseudoarthrosis L 4-5.   She underswent Re - do decompressive lumbar laminectomy L 4-5; poserioer fixation L3-5.  PMH includes:  peripheral neuropathy, OSA on CPAP, fibromyalgia, chronic back pain, h/o breast CA, s/p bil TKA, s/p lumbar fusion 1/19    PT Comments    Patient progressing well ambulating 373ft with modified independence. Patient IS SAFE for d/c home from mobility standpoint. No further acute PT needs.  Patient DOES NOT require any further PT services at this time.   Follow Up Recommendations  No PT follow up;Supervision - Intermittent     Equipment Recommendations  Rolling walker with 5" wheels    Recommendations for Other Services       Precautions / Restrictions Precautions Precautions: Back Precaution Comments: Pt able to recall precautions from previous surgery     Mobility  Bed Mobility Overal bed mobility: Needs Assistance Bed Mobility: Rolling;Sidelying to Sit Rolling: Supervision Sidelying to sit: Supervision       General bed mobility comments: Min assist to elevate trunk to upright position at EOB, increased time and effort to perform-HOB FLAT EXITS ON R SIDE, reports very high bed and uses a step stool  Transfers                    Ambulation/Gait Ambulation/Gait assistance: Modified independent (Device/Increase time) Gait Distance (Feet): 300 Feet Assistive device: Rolling walker (2 wheeled) Gait Pattern/deviations: Step-through pattern;Decreased stride length;Trunk flexed Gait velocity: decreased   General Gait Details: No physical assist or cues required   Stairs             Wheelchair Mobility    Modified Rankin (Stroke Patients Only)       Balance Overall balance assessment: Mild deficits observed, not formally tested                                          Cognition Arousal/Alertness: Awake/alert Behavior During Therapy: WFL for tasks assessed/performed Overall Cognitive Status: Within Functional Limits for tasks assessed                                        Exercises      General Comments        Pertinent Vitals/Pain Pain Assessment: 0-10 Pain Score: 7  Pain Location: back Pain Descriptors / Indicators: Aching;Operative site guarding Pain Intervention(s): Monitored during session    Home Living                      Prior Function            PT Goals (current goals can now be found in the care plan section) Acute Rehab PT Goals Patient Stated Goal: to go home with daughter PT Goal Formulation: With patient Time For Goal Achievement: 12/14/17 Potential to Achieve Goals: Good Progress towards PT goals: Progressing toward goals    Frequency    Min 5X/week      PT Plan Current plan remains appropriate    Co-evaluation              AM-PAC PT "6 Clicks" Daily Activity  Outcome Measure  Difficulty turning over in bed (including adjusting bedclothes, sheets and blankets)?: A Little Difficulty moving from lying on back to sitting on the side of the bed? : A Little Difficulty sitting down on and standing up from a chair with arms (e.g., wheelchair, bedside commode, etc,.)?: A Little Help needed moving to and from a bed to chair (including a wheelchair)?: A Little Help needed walking in hospital room?: A Little Help needed climbing 3-5 steps with a railing? : A Little 6 Click Score: 18    End of Session Equipment Utilized During Treatment: Gait belt Activity Tolerance: Patient tolerated treatment well Patient left: in chair;with call bell/phone within reach Nurse Communication: Mobility status PT Visit Diagnosis: Difficulty in walking, not elsewhere classified (R26.2)     Time: 6886-4847 PT Time Calculation (min) (ACUTE ONLY): 17  min  Charges:  $Gait Training: 8-22 mins                    G Codes:       Alben Deeds, PT DPT  Board Certified Neurologic Specialist [phone]    Duncan Dull 12/02/2017, 8:53 AM

## 2017-12-02 NOTE — Care Management Important Message (Signed)
Important Message  Patient Details  Name: Kelsey Wilson MRN: [id number] Date of Birth: 05/03/1961   Medicare Important Message Given:  Yes    Orbie Pyo 12/02/2017, 4:53 PM

## 2017-12-03 LAB — GLUCOSE, CAPILLARY: Glucose-Capillary: 140 mg/dL — ABNORMAL HIGH (ref 70–99)

## 2017-12-13 ENCOUNTER — Encounter: Payer: Self-pay | Admitting: Registered Nurse

## 2017-12-13 ENCOUNTER — Other Ambulatory Visit: Payer: Self-pay

## 2017-12-13 ENCOUNTER — Encounter: Payer: Medicare Other | Attending: Physical Medicine & Rehabilitation | Admitting: Registered Nurse

## 2017-12-13 VITALS — BP 118/82 | HR 87 | Ht 63.0 in | Wt 284.0 lb

## 2017-12-13 DIAGNOSIS — M1711 Unilateral primary osteoarthritis, right knee: Secondary | ICD-10-CM

## 2017-12-13 DIAGNOSIS — M1712 Unilateral primary osteoarthritis, left knee: Secondary | ICD-10-CM

## 2017-12-13 DIAGNOSIS — Z853 Personal history of malignant neoplasm of breast: Secondary | ICD-10-CM | POA: Diagnosis not present

## 2017-12-13 DIAGNOSIS — M545 Low back pain: Secondary | ICD-10-CM | POA: Insufficient documentation

## 2017-12-13 DIAGNOSIS — Z981 Arthrodesis status: Secondary | ICD-10-CM | POA: Diagnosis not present

## 2017-12-13 DIAGNOSIS — G8929 Other chronic pain: Secondary | ICD-10-CM | POA: Diagnosis not present

## 2017-12-13 DIAGNOSIS — M5416 Radiculopathy, lumbar region: Secondary | ICD-10-CM | POA: Diagnosis not present

## 2017-12-13 DIAGNOSIS — Z9889 Other specified postprocedural states: Secondary | ICD-10-CM | POA: Insufficient documentation

## 2017-12-13 DIAGNOSIS — M48062 Spinal stenosis, lumbar region with neurogenic claudication: Secondary | ICD-10-CM

## 2017-12-13 DIAGNOSIS — G8918 Other acute postprocedural pain: Secondary | ICD-10-CM | POA: Diagnosis not present

## 2017-12-13 DIAGNOSIS — M48061 Spinal stenosis, lumbar region without neurogenic claudication: Secondary | ICD-10-CM | POA: Insufficient documentation

## 2017-12-13 DIAGNOSIS — M47817 Spondylosis without myelopathy or radiculopathy, lumbosacral region: Secondary | ICD-10-CM

## 2017-12-13 DIAGNOSIS — G4733 Obstructive sleep apnea (adult) (pediatric): Secondary | ICD-10-CM | POA: Insufficient documentation

## 2017-12-13 DIAGNOSIS — G894 Chronic pain syndrome: Secondary | ICD-10-CM

## 2017-12-13 DIAGNOSIS — F329 Major depressive disorder, single episode, unspecified: Secondary | ICD-10-CM | POA: Insufficient documentation

## 2017-12-13 DIAGNOSIS — K219 Gastro-esophageal reflux disease without esophagitis: Secondary | ICD-10-CM | POA: Diagnosis not present

## 2017-12-13 DIAGNOSIS — M7061 Trochanteric bursitis, right hip: Secondary | ICD-10-CM

## 2017-12-13 DIAGNOSIS — Z5181 Encounter for therapeutic drug level monitoring: Secondary | ICD-10-CM

## 2017-12-13 DIAGNOSIS — M7062 Trochanteric bursitis, left hip: Secondary | ICD-10-CM

## 2017-12-13 DIAGNOSIS — M797 Fibromyalgia: Secondary | ICD-10-CM

## 2017-12-13 DIAGNOSIS — Z79891 Long term (current) use of opiate analgesic: Secondary | ICD-10-CM

## 2017-12-13 DIAGNOSIS — M62838 Other muscle spasm: Secondary | ICD-10-CM

## 2017-12-13 MED ORDER — HYDROCODONE-ACETAMINOPHEN 5-325 MG PO TABS: 1.0000 | ORAL_TABLET | Freq: Four times a day (QID) | ORAL | 0 refills | Status: DC | PRN

## 2017-12-13 NOTE — Progress Notes (Signed)
Subjective:    Patient ID: Kelsey Wilson, female    DOB: Jan 05, 1961, 57 y.o.   MRN: [id number]  HPI: Kelsey Wilson is a 57 year old female who returns for follow up appointment for chronic pain and medication refill. She states her pain is located in her lower back radiating into her bilateral buttocks and bilateral hip pain. She rates her pain 8. Her current exercise regime is walking, performing stretching exercises and receiving physical therapy twice a week.   Kelsey Wilson is 20.00 MME. Last Oral Swab was Performed on 11/21/2017, it was consistent.   Kelsey Wilson hd Posterior Lateral Fusion L-3-4-L-5 removal and replacement of hardware, Laminectomy L4-L5 by Dr. Ronnald Ramp on 11/29/2017.   Pain Inventory Average Pain 8 Pain Right Now 8 My pain is burning, tingling and aching  In the last 24 hours, has pain interfered with the following? General activity 2 Relation with others 2 Enjoyment of life 2 What TIME of day is your pain at its worst? daytime and night Sleep (in general) Fair  Pain is worse with: walking, bending, sitting, inactivity, standing and some activites Pain improves with: medication Relief from Meds: 2  Mobility walk with assistance use a cane use a walker ability to climb steps?  no do you drive?  no  Function I need assistance with the following:  feeding, dressing, meal prep and household duties  Neuro/Psych numbness tingling trouble walking spasms anxiety  Prior Studies Any changes since last visit?  no  Physicians involved in your care Any changes since last visit?  no   Family History  Problem Relation Age of Onset  . Hypertension Mother   . Diabetes Mother   . Hypertension Father   . Diabetes Father   . Cancer Paternal Grandmother        unknown  . Colon cancer Neg Hx    Social History   Socioeconomic History  . Marital status: Single    Spouse name: Not on file  . Number of children: 3  . Years of  education: Not on file  . Highest education level: Not on file  Occupational History  . Not on file  Social Needs  . Financial resource strain: Not on file  . Food insecurity:    Worry: Not on file    Inability: Not on file  . Transportation needs:    Medical: Not on file    Non-medical: Not on file  Tobacco Use  . Smoking status: Never Smoker  . Smokeless tobacco: Never Used  Substance and Sexual Activity  . Alcohol use: No    Alcohol/week: 0.0 oz  . Drug use: No  . Sexual activity: Never    Birth control/protection: Post-menopausal, Surgical  Lifestyle  . Physical activity:    Days per week: Not on file    Minutes per session: Not on file  . Stress: Not on file  Relationships  . Social connections:    Talks on phone: Not on file    Gets together: Not on file    Attends religious service: Not on file    Active member of club or organization: Not on file    Attends meetings of clubs or organizations: Not on file    Relationship status: Not on file  Other Topics Concern  . Not on file  Social History Narrative  . Not on file   Past Surgical History:  Procedure Laterality Date  . ABDOMINAL HYSTERECTOMY  still has ovaries  . APPENDECTOMY    . AXILLARY LYMPH NODE DISSECTION  11/28/2011   Procedure: AXILLARY LYMPH NODE DISSECTION;  Surgeon: Edward Jolly, MD;  Location: [facility];  Service: General;  Laterality: Left;  . BREAST LUMPECTOMY Left 11/28/2011   Malignant  . BREAST SURGERY Left 2013  . CARPAL TUNNEL RELEASE     Bilateral  . CESAREAN SECTION     pt. has had 3  . CHOLECYSTECTOMY    . COLONOSCOPY    . EYE SURGERY Bilateral    cataract removal  . KNEE SURGERY     Left Knee  . LAMINECTOMY WITH POSTERIOR LATERAL ARTHRODESIS LEVEL 1 N/A 11/29/2017   Procedure: Posterior Lateral Fusion - Lumbar Four-Lumbar Five, removal and replacement of hardware, Laminectomy - Lumbar Four-Lumbar Five;  Surgeon: Eustace Moore, MD;  Location: [facility];  Service:  Neurosurgery;  Laterality: N/A;  . LAMINECTOMY WITH POSTERIOR LATERAL ARTHRODESIS LEVEL 2 N/A 06/07/2017   Procedure: Posterior Lateral Fusion Lumbar Three-Four and Transforaminal Interbody Fusion Lumbar Four-Five with Segmental  Pedicle Screw Fixation;  Surgeon: Eustace Moore, MD;  Location: [facility];  Service: Neurosurgery;  Laterality: N/A;  Posterior Lateral Fusion Lumbar Three-Four and Transforaminal Interbody Fusion Lumbar Four-Five with Segmental  Pedicle Screw Fixation   . LUMBAR FUSION  06/07/2017   POST  . LUMBAR LAMINECTOMY/DECOMPRESSION MICRODISCECTOMY Bilateral 01/27/2016   Procedure: Laminectomy and Foraminotomy - Lumbar four -Lumbar five - bilateral- on-lay noninstrumented fusion;  Surgeon: Eustace Moore, MD;  Location: [facility];  Service: Neurosurgery;  Laterality: Bilateral;  . MULTIPLE EXTRACTIONS WITH ALVEOLOPLASTY N/A 05/11/2016   Procedure: EXTRACTION OF TEETH EIGHTEEN, TWENTY AND TWENTY- NINE;  REMOVAL OF MANDIBULAR TORUS AND EXOSTOSIS;  Surgeon: Diona Browner, DDS;  Location: [facility];  Service: Oral Surgery;  Laterality: N/A;  . PORTACATH PLACEMENT  06/18/2011   Procedure: INSERTION PORT-A-CATH;  Surgeon: Edward Jolly, MD;  Location: [place] [facility];  Service: General;  Laterality: Right;  right subclavian  . removal portacath  2014  . spur     Apex spur on both big toes  . TOE SURGERY Bilateral   . TONSILLECTOMY    . TOTAL KNEE ARTHROPLASTY Left 09/13/2014  . TOTAL KNEE ARTHROPLASTY Left 09/13/2014   Procedure: LEFT TOTAL KNEE ARTHROPLASTY;  Surgeon: Rod Can, MD;  Location: [facility];  Service: Orthopedics;  Laterality: Left;  . TOTAL KNEE ARTHROPLASTY Right 03/10/2015   Procedure: RIGHT TOTAL KNEE ARTHROPLASTY;  Surgeon: Rod Can, MD;  Location: [facility];  Service: Orthopedics;  Laterality: Right;   Past Medical History:  Diagnosis Date  . Anemia   . Anxiety   . Arthritis   . Breast cancer ([place]) 2010   T3N1 invasive ductal carcinoma left  breast.Takes Arimidex daily  . Bursitis   . Carpal tunnel syndrome   . Chronic back pain    stenosis  . Constipation    takes Colace daily  . Depression    takes Benzotropine daily  . Diverticulitis of colon   . Dyspnea    daily when walking for over 1 yr.  . Fibromyalgia 08/2012  . GERD (gastroesophageal reflux disease)    takes Dexilant daily  . Hemorrhoid   . History of blood transfusion    no abnormal reaction noted  . History of colon polyps    benign  . History of shingles   . Joint pain   . Joint swelling   . Night muscle spasms    takes Flexeril  nightly as needed  . Nocturia   . OSA (obstructive sleep apnea)   . OSA on CPAP   . Peripheral edema    takes Furosemide.Just started 01/18/16  . Peripheral neuropathy    takes Lyrica daily  . Personal history of chemotherapy    2013  . Personal history of radiation therapy    2013  . Pneumonia    hx of-2015  . Pseudoarthrosis of lumbar spine   . Seasonal allergies    takes Singulair nightly  . SOB (shortness of breath) on exertion    rarely with exertion  . 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 Alphonzo Grieve)   BP 118/82   Pulse 87   Ht 5\' 3"  (1.6 m)   Wt 284 lb (128.8 kg)   SpO2 92%   BMI 50.31 kg/m   Opioid Risk Score:   Fall Risk Score:  `1  Depression screen PHQ 2/9  Depression screen [facility] 2/9 12/13/2017 06/21/2017 03/07/2017 08/23/2015 04/26/2015 02/04/2015  Decreased Interest 3 1 2 2  0 0  Down, Depressed, Hopeless 3 1 2  0 0 0  PHQ - 2 Score 6 2 4 2  0 0  Altered sleeping - - 0 1 - -  Tired, decreased energy - - 3 3 - -  Change in appetite - - 3 3 - -  Feeling bad or failure about yourself  - - 2 0 - -  Trouble concentrating - - 3 3 - -  Moving slowly or fidgety/restless - - 3 2 - -  Suicidal thoughts - - 0 0 - -  PHQ-9 Score - - 18 14 - -  Difficult doing work/chores - - Extremely  dIfficult Very difficult - -  Some recent data might be hidden     Review of Systems  Constitutional: Positive for unexpected weight change.       Night sweats  HENT: Negative.   Eyes: Negative.   Respiratory: Positive for apnea, cough, shortness of breath and wheezing. Negative for choking.   Cardiovascular: Negative.   Gastrointestinal: Positive for nausea.  Endocrine: Negative.   Genitourinary: Negative.   Musculoskeletal: Negative.   Skin: Negative.   Allergic/Immunologic: Negative.   Neurological: Negative.   Hematological: Negative.   Psychiatric/Behavioral: Negative.   All other systems reviewed and are negative.      Objective:   Physical Exam  Constitutional: She is oriented to person, place, and time. She appears well-developed and well-nourished.  HENT:  Head: Normocephalic and atraumatic.  Neck: Normal range of motion. Neck supple.  Cardiovascular: Normal rate and regular rhythm.  Pulmonary/Chest: Effort normal and breath sounds normal.  Musculoskeletal:  Normal Muscle Bulk and Muscle Testing Reveals: Upper Extremities: Decreased ROM 90 Degrees  and Muscle Strength 4/5 Lumbar Hypersensitivity: Staples Open to Air no Drainage Lower Extremities: Decreased ROM and Muscle Strength 4/5 Bilateral Greater Trochanter Tenderness Arises from chair slowly using walker for support Antalgic Gait   Neurological: She is alert and oriented to person, place, and time.  Skin: Skin is warm and dry.  Psychiatric: She has a normal mood and affect. Her behavior is normal.  Nursing note and vitals reviewed.         Assessment & Plan:  1. Lumbar Postlaminectomy/ S/P Lumbar Fusion:Spinal Stenosis: S/P Posterior Lateral Fusion L-4-L-5 removal and replacement of hardware, Laminectomy L4-L5 by Dr. Ronnald Ramp on 11/29/2017.  Continue HEP as Tolerated and Continue  current medication regime. Hydrocodone 5/325 mg one tablet every 6 hours as needed. 12/13/2017 2. Lumbar Radiculitis:  Continue current medication regime with Lyrica.12/13/2017 3. Fibromyalgia:Encouraged to Increase HEP as Tolerated.Continue Current Medication Regimen with Lyrica. 12/13/2017 4. Bilateral Greater Trochanter Bursitis:Continue to Alternate Ice and Heat Therapy.12/13/2017. 5. Muscle Spasm:Continue:Robaxin.Continue to Monitor. 12/13/2017. 6. Chronic Pain Syndrome: Continue Current medication regimen and HEP as Tolerated. Continue to Monitor. 12/13/2017. 7. Bilateral OA of Bilateral Knees: Also reports she has Voltaren Gel at home, she uses it sparingly due to GI upset. Continue to Monitor. 12/13/2017. 8. Bilateral Hand Pain: Continue to Monitor/ ? OA: Continue to Monitor.07/122019  20 minutes of Face to Face Patient Care time was spent during this visit. All questions encouraged and answered,   F/U in 1 month

## 2017-12-16 ENCOUNTER — Ambulatory Visit (HOSPITAL_COMMUNITY)
Admission: RE | Admit: 2017-12-16 | Discharge: 2017-12-16 | Disposition: A | Discharge status: Home / Self Care | Payer: Medicare Other | Source: Ambulatory Visit | Attending: Oncology | Admitting: Oncology

## 2017-12-16 ENCOUNTER — Encounter (HOSPITAL_COMMUNITY): Payer: Self-pay

## 2017-12-16 DIAGNOSIS — R918 Other nonspecific abnormal finding of lung field: Secondary | ICD-10-CM | POA: Insufficient documentation

## 2017-12-16 DIAGNOSIS — I517 Cardiomegaly: Secondary | ICD-10-CM | POA: Diagnosis not present

## 2017-12-16 DIAGNOSIS — R911 Solitary pulmonary nodule: Secondary | ICD-10-CM

## 2017-12-16 DIAGNOSIS — C50412 Malignant neoplasm of upper-outer quadrant of left female breast: Secondary | ICD-10-CM | POA: Diagnosis not present

## 2017-12-16 DIAGNOSIS — Z170 Estrogen receptor positive status [ER+]: Secondary | ICD-10-CM | POA: Insufficient documentation

## 2017-12-16 IMAGING — CT CT CHEST W/ CM
2 of 4 series · 14 of 36 positions shown, 17 images · IV contrast (iopamidol)
Comparison: Chest CT 09/13/2017.

CLINICAL DATA: 56-year-old female with history of left-sided breast
cancer diagnosed in 1752. Cough and shortness of breath for 1 year.
Status post left lumpectomy, chemotherapy and radiation therapy now
complete.

EXAM:
CT CHEST WITH CONTRAST
TECHNIQUE: Multidetector CT imaging of the chest was performed during
intravenous contrast administration.
CONTRAST:  75mL WBOWEJ-266 IOPAMIDOL (WBOWEJ-266) INJECTION 61%

[Series 2: axial st · axial · 0.84mm/px · z∈[-433,-217]mm · 11 of 127 slices shown, 14 images]
[im 10/127  mediastinal]
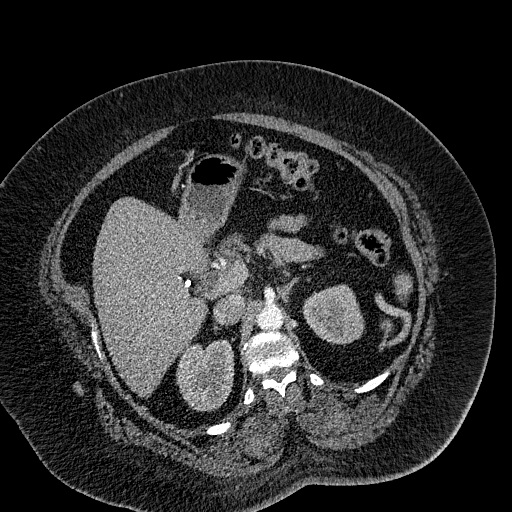
[im 10/127  lung]
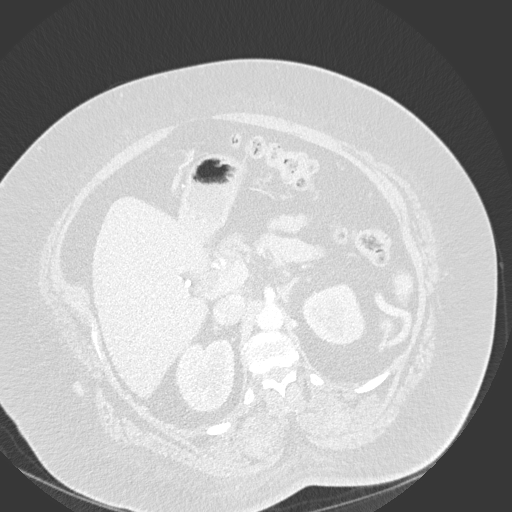
[im 19/127  lung]
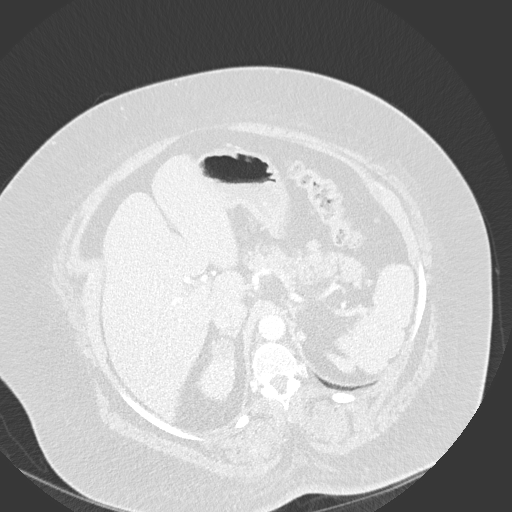
[im 28/127  lung]
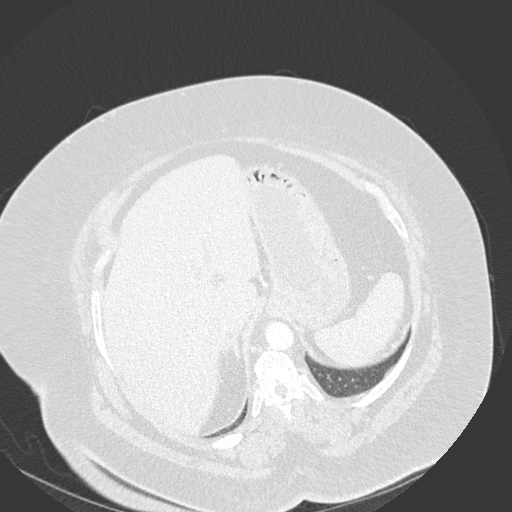
[im 46/127  lung]
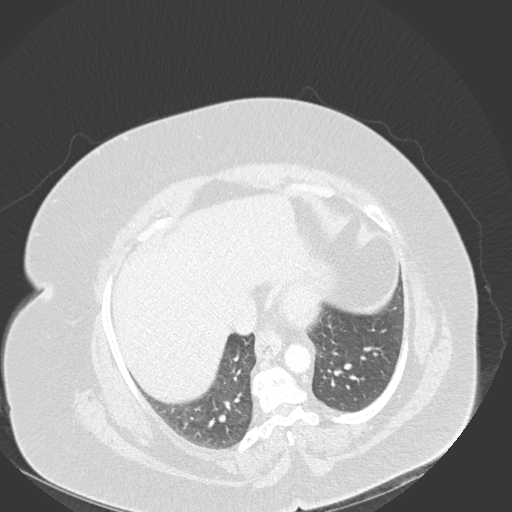
[im 55/127  mediastinal]
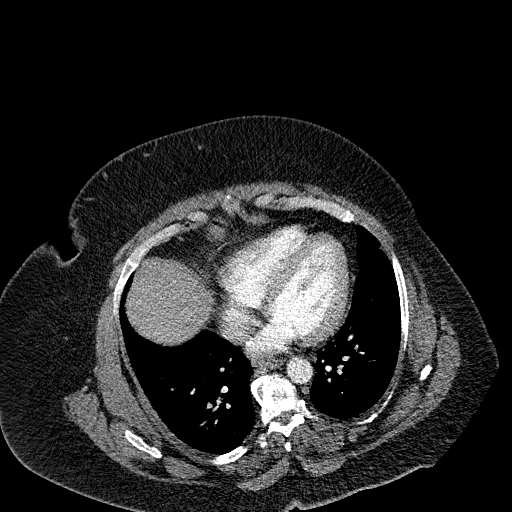
[im 55/127  lung]
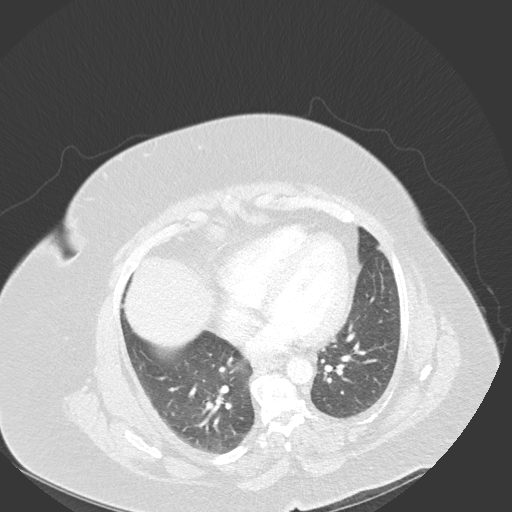
[im 64/127  lung]
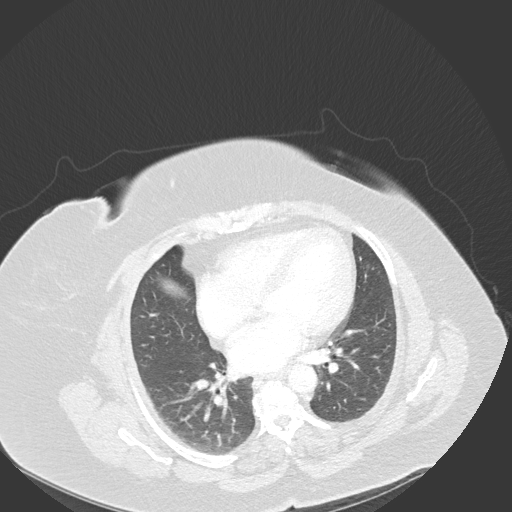
[im 73/127  lung]
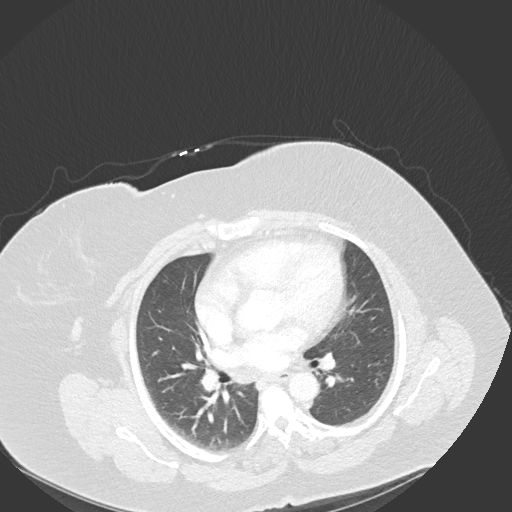
[im 82/127  lung]
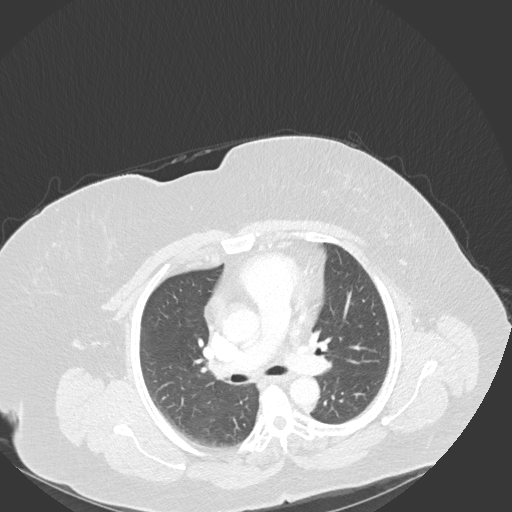
[im 100/127  mediastinal]
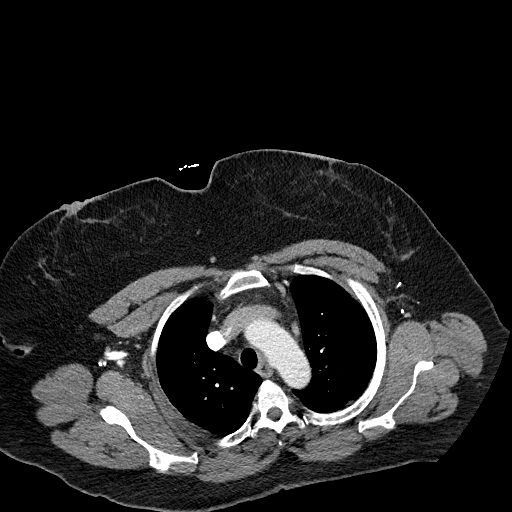
[im 100/127  lung]
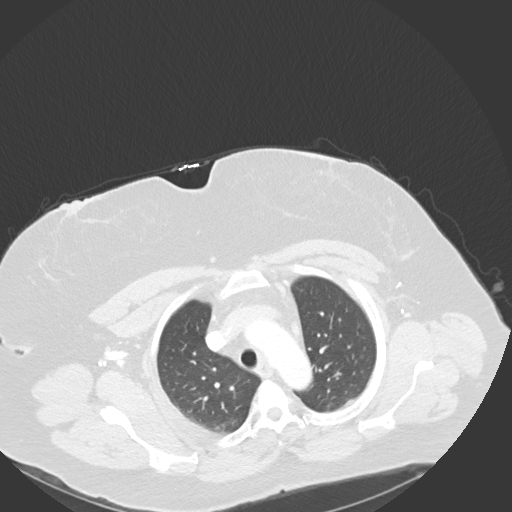
[im 109/127  lung]
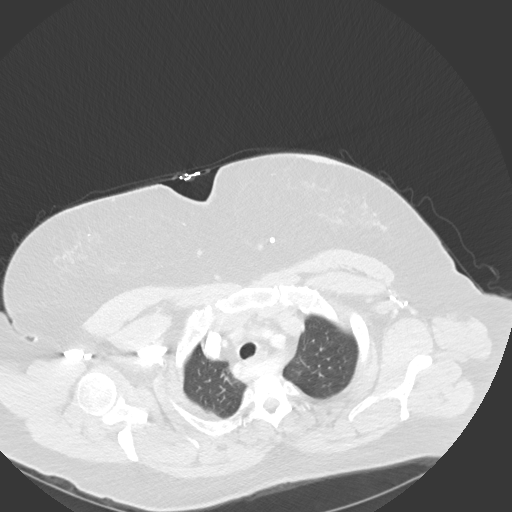
[im 118/127  lung]
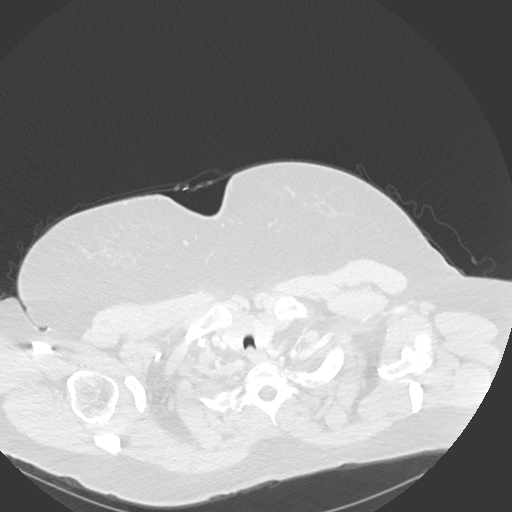

[Series 5: coronal · coronal · 0.52mm/px · 3 of 157 slices shown]
[im 32/157  lung]
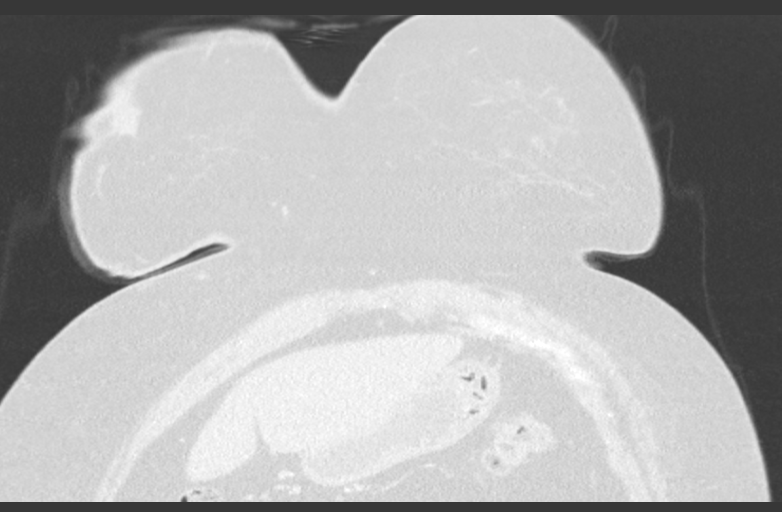
[im 63/157  lung]
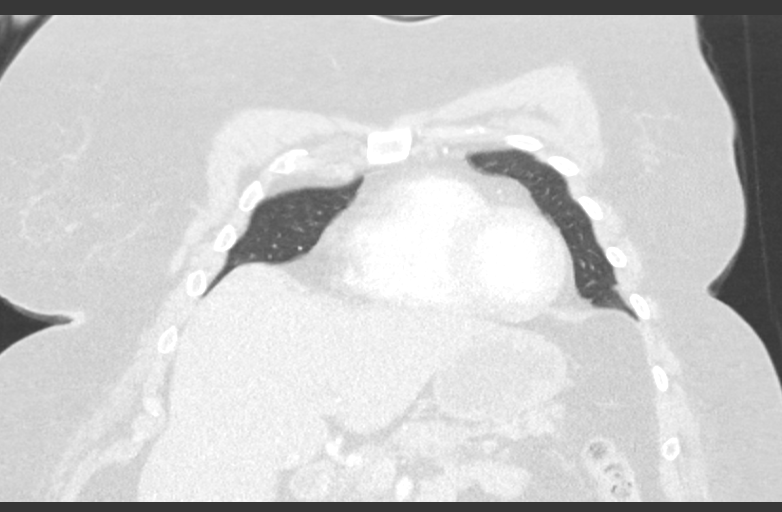
[im 94/157  lung]
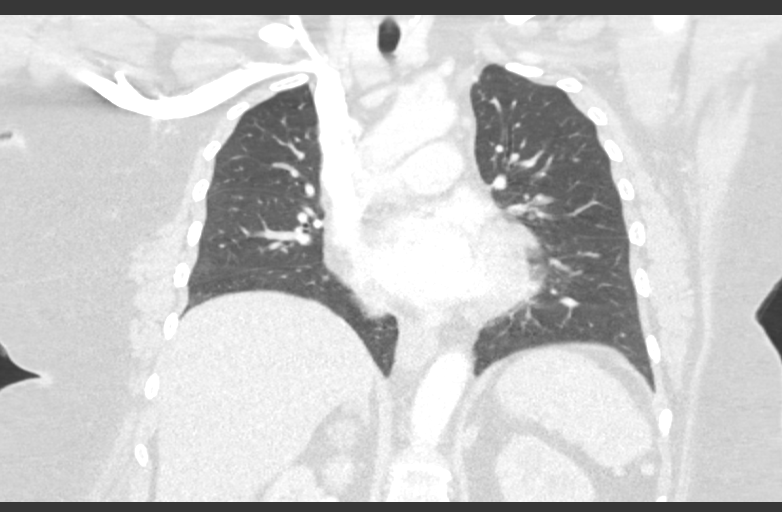

[14 of 36 positions shown; findings below may reference images not displayed]

FINDINGS: Cardiovascular: Heart size is mildly enlarged. There is no
significant pericardial fluid, thickening or pericardial
calcification. Abdomen right subclavian artery (normal anatomical
variant) incidentally noted. No significant atherosclerotic disease
noted in the thoracic aorta or the coronary arteries.

Mediastinum/Nodes: No pathologically enlarged mediastinal or hilar
lymph nodes. Small to moderate hiatal hernia. No axillary
lymphadenopathy. Numerous surgical clips in the left axilla from
prior lymph node dissection.

Lungs/Pleura: Previously demonstrated 8 mm nodule in the inferior
segment of the lingula (axial image 67 of series 7) is stable
compared to the prior examination. 3 mm nodule in the posterior
aspect of the left lower lobe (axial image 70 of series 7) is also
unchanged. No other new suspicious appearing pulmonary nodules or
masses are noted. No acute consolidative airspace disease. No
pleural effusions.

Upper Abdomen: 21 mm simple cyst in the upper pole of the right
kidney. Status post cholecystectomy.

Musculoskeletal: There are no aggressive appearing lytic or blastic
lesions noted in the visualized portions of the skeleton.
IMPRESSION: 1. Previously noted nodules in the left lung are stable compared to
the prior study. This is reassuring, however, close attention on
follow-up studies is recommended to ensure continued stability.
Repeat noncontrast chest CT is recommended in 6-9 months to ensure
continued stability.
2. Mild cardiomegaly.

## 2017-12-16 MED ORDER — IOPAMIDOL (ISOVUE-300) INJECTION 61%
100.0000 mL | Freq: Once | INTRAVENOUS | Status: AC | PRN
  Administered 2017-12-16: 75 mL via INTRAVENOUS

## 2017-12-16 MED ORDER — IOPAMIDOL (ISOVUE-300) INJECTION 61%
INTRAVENOUS | Status: AC
  Filled 2017-12-16: qty 100

## 2017-12-16 MED FILL — LYRICA 100 MG CAPSULE: 100 | 30 days supply | Qty: 90 | Fill #2

## 2017-12-17 ENCOUNTER — Other Ambulatory Visit: Payer: Self-pay | Admitting: Oncology

## 2018-01-02 MED FILL — ANASTROZOLE 1 MG TABLET: 1 | 30 days supply | Qty: 30 | Fill #3

## 2018-01-13 ENCOUNTER — Encounter: Payer: Self-pay | Admitting: Physical Medicine & Rehabilitation

## 2018-01-13 ENCOUNTER — Other Ambulatory Visit: Payer: Self-pay

## 2018-01-13 ENCOUNTER — Ambulatory Visit (HOSPITAL_BASED_OUTPATIENT_CLINIC_OR_DEPARTMENT_OTHER): Payer: Medicare Other | Admitting: Physical Medicine & Rehabilitation

## 2018-01-13 ENCOUNTER — Encounter: Payer: Medicare Other | Attending: Physical Medicine & Rehabilitation

## 2018-01-13 VITALS — BP 125/81 | HR 93 | Ht 63.0 in | Wt 288.0 lb

## 2018-01-13 DIAGNOSIS — M48061 Spinal stenosis, lumbar region without neurogenic claudication: Secondary | ICD-10-CM | POA: Diagnosis not present

## 2018-01-13 DIAGNOSIS — K219 Gastro-esophageal reflux disease without esophagitis: Secondary | ICD-10-CM | POA: Insufficient documentation

## 2018-01-13 DIAGNOSIS — G8929 Other chronic pain: Secondary | ICD-10-CM | POA: Diagnosis present

## 2018-01-13 DIAGNOSIS — Z981 Arthrodesis status: Secondary | ICD-10-CM | POA: Diagnosis not present

## 2018-01-13 DIAGNOSIS — G4733 Obstructive sleep apnea (adult) (pediatric): Secondary | ICD-10-CM | POA: Insufficient documentation

## 2018-01-13 DIAGNOSIS — M48062 Spinal stenosis, lumbar region with neurogenic claudication: Secondary | ICD-10-CM

## 2018-01-13 DIAGNOSIS — Z853 Personal history of malignant neoplasm of breast: Secondary | ICD-10-CM | POA: Diagnosis not present

## 2018-01-13 DIAGNOSIS — Z9889 Other specified postprocedural states: Secondary | ICD-10-CM | POA: Insufficient documentation

## 2018-01-13 DIAGNOSIS — G8918 Other acute postprocedural pain: Secondary | ICD-10-CM | POA: Insufficient documentation

## 2018-01-13 DIAGNOSIS — M797 Fibromyalgia: Secondary | ICD-10-CM | POA: Insufficient documentation

## 2018-01-13 DIAGNOSIS — F329 Major depressive disorder, single episode, unspecified: Secondary | ICD-10-CM | POA: Diagnosis not present

## 2018-01-13 DIAGNOSIS — M545 Low back pain: Secondary | ICD-10-CM | POA: Diagnosis present

## 2018-01-13 MED ORDER — HYDROCODONE-ACETAMINOPHEN 5-325 MG PO TABS: 1.0000 | ORAL_TABLET | Freq: Four times a day (QID) | ORAL | 0 refills | Status: DC | PRN

## 2018-01-13 NOTE — Progress Notes (Addendum)
Subjective:    Patient ID: CATHE BILGER, female    DOB: 02-21-1961, 57 y.o.   MRN: [id number] Posterior Lateral Fusion L-3-4-L-5 removal and replacement of hardware, Laminectomy L4-L5 by Dr. Ronnald Ramp on 11/29/2017. HPI  Still taking Hydrocodone 5/325 QID Lyrica 100mg  TID Denies constipation and other side effects Mobility still impaired Having problems completing housework Mopping and cleaning tub still difficult, as is cooking Completed HH PT Pain Inventory Average Pain 7 Pain Right Now 9 My pain is aching  In the last 24 hours, has pain interfered with the following? General activity 7 Relation with others 6 Enjoyment of life 7 What TIME of day is your pain at its worst? varies Sleep (in general) Fair  Pain is worse with: bending, inactivity and unsure Pain improves with: rest and medication Relief from Meds: 3  Mobility use a cane ability to climb steps?  no do you drive?  yes  Function disabled: date disabled . I need assistance with the following:  meal prep, household duties and shopping  Neuro/Psych weakness numbness tingling spasms depression anxiety  Prior Studies Any changes since last visit?  no  Physicians involved in your care Any changes since last visit?  no   Family History  Problem Relation Age of Onset  . Hypertension Mother   . Diabetes Mother   . Hypertension Father   . Diabetes Father   . Cancer Paternal Grandmother        unknown  . Colon cancer Neg Hx    Social History   Socioeconomic History  . Marital status: Single    Spouse name: Not on file  . Number of children: 3  . Years of education: Not on file  . Highest education level: Not on file  Occupational History  . Not on file  Social Needs  . Financial resource strain: Not on file  . Food insecurity:    Worry: Not on file    Inability: Not on file  . Transportation needs:    Medical: Not on file    Non-medical: Not on file  Tobacco Use  . Smoking status:  Never Smoker  . Smokeless tobacco: Never Used  Substance and Sexual Activity  . Alcohol use: No    Alcohol/week: 0.0 standard drinks  . Drug use: No  . Sexual activity: Never    Birth control/protection: Post-menopausal, Surgical  Lifestyle  . Physical activity:    Days per week: Not on file    Minutes per session: Not on file  . Stress: Not on file  Relationships  . Social connections:    Talks on phone: Not on file    Gets together: Not on file    Attends religious service: Not on file    Active member of club or organization: Not on file    Attends meetings of clubs or organizations: Not on file    Relationship status: Not on file  Other Topics Concern  . Not on file  Social History Narrative  . Not on file   Past Surgical History:  Procedure Laterality Date  . ABDOMINAL HYSTERECTOMY     still has ovaries  . APPENDECTOMY    . AXILLARY LYMPH NODE DISSECTION  11/28/2011   Procedure: AXILLARY LYMPH NODE DISSECTION;  Surgeon: Edward Jolly, MD;  Location: [place];  Service: General;  Laterality: Left;  . BREAST LUMPECTOMY Left 11/28/2011   Malignant  . BREAST SURGERY Left 2013  . CARPAL TUNNEL RELEASE     Bilateral  .  CESAREAN SECTION     pt. has had 3  . CHOLECYSTECTOMY    . COLONOSCOPY    . EYE SURGERY Bilateral    cataract removal  . KNEE SURGERY     Left Knee  . LAMINECTOMY WITH POSTERIOR LATERAL ARTHRODESIS LEVEL 1 N/A 11/29/2017   Procedure: Posterior Lateral Fusion - Lumbar Four-Lumbar Five, removal and replacement of hardware, Laminectomy - Lumbar Four-Lumbar Five;  Surgeon: Eustace Moore, MD;  Location: [facility];  Service: Neurosurgery;  Laterality: N/A;  . LAMINECTOMY WITH POSTERIOR LATERAL ARTHRODESIS LEVEL 2 N/A 06/07/2017   Procedure: Posterior Lateral Fusion Lumbar Three-Four and Transforaminal Interbody Fusion Lumbar Four-Five with Segmental  Pedicle Screw Fixation;  Surgeon: Eustace Moore, MD;  Location: [place];  Service: Neurosurgery;  Laterality: N/A;   Posterior Lateral Fusion Lumbar Three-Four and Transforaminal Interbody Fusion Lumbar Four-Five with Segmental  Pedicle Screw Fixation   . LUMBAR FUSION  06/07/2017   POST  . LUMBAR LAMINECTOMY/DECOMPRESSION MICRODISCECTOMY Bilateral 01/27/2016   Procedure: Laminectomy and Foraminotomy - Lumbar four -Lumbar five - bilateral- on-lay noninstrumented fusion;  Surgeon: Eustace Moore, MD;  Location: [facility];  Service: Neurosurgery;  Laterality: Bilateral;  . MULTIPLE EXTRACTIONS WITH ALVEOLOPLASTY N/A 05/11/2016   Procedure: EXTRACTION OF TEETH EIGHTEEN, TWENTY AND TWENTY- NINE;  REMOVAL OF MANDIBULAR TORUS AND EXOSTOSIS;  Surgeon: Diona Browner, DDS;  Location: [place];  Service: Oral Surgery;  Laterality: N/A;  . PORTACATH PLACEMENT  06/18/2011   Procedure: INSERTION PORT-A-CATH;  Surgeon: Edward Jolly, MD;  Location: [place];  Service: General;  Laterality: Right;  right subclavian  . removal portacath  2014  . spur     Apex spur on both big toes  . TOE SURGERY Bilateral   . TONSILLECTOMY    . TOTAL KNEE ARTHROPLASTY Left 09/13/2014  . TOTAL KNEE ARTHROPLASTY Left 09/13/2014   Procedure: LEFT TOTAL KNEE ARTHROPLASTY;  Surgeon: Rod Can, MD;  Location: [place];  Service: Orthopedics;  Laterality: Left;  . TOTAL KNEE ARTHROPLASTY Right 03/10/2015   Procedure: RIGHT TOTAL KNEE ARTHROPLASTY;  Surgeon: Rod Can, MD;  Location: [facility];  Service: Orthopedics;  Laterality: Right;   Past Medical History:  Diagnosis Date  . Anemia   . Anxiety   . Arthritis   . Breast cancer ([place]) 2010   T3N1 invasive ductal carcinoma left breast.Takes Arimidex daily  . Bursitis   . Carpal tunnel syndrome   . Chronic back pain    stenosis  . Constipation    takes Colace daily  . Depression    takes Benzotropine daily  . Diverticulitis of colon   . Dyspnea    daily when walking for over 1 yr.  . Fibromyalgia 08/2012  . GERD (gastroesophageal reflux disease)    takes Dexilant  daily  . Hemorrhoid   . History of blood transfusion    no abnormal reaction noted  . History of colon polyps    benign  . History of shingles   . Joint pain   . Joint swelling   . Night muscle spasms    takes Flexeril nightly as needed  . Nocturia   . OSA (obstructive sleep apnea)   . OSA on CPAP   . Peripheral edema    takes Furosemide.Just started 01/18/16  . Peripheral neuropathy    takes Lyrica daily  . Personal history of chemotherapy    2013  . Personal history of radiation therapy    2013  . Pneumonia  hx of-2015  . Pseudoarthrosis of lumbar spine   . Seasonal allergies    takes Singulair nightly  . SOB (shortness of breath) on exertion    rarely with exertion  . 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 Alphonzo Grieve)   BP 125/81   Pulse 93   Ht 5\' 3"  (1.6 m)   Wt 288 lb (130.6 kg)   SpO2 92%   BMI 51.02 kg/m   Opioid Risk Score:   Fall Risk Score:  `1  Depression screen PHQ 2/9  Depression screen [facility] 2/9 01/13/2018 12/13/2017 06/21/2017 03/07/2017 08/23/2015 04/26/2015 02/04/2015  Decreased Interest 3 3 1 2 2  0 0  Down, Depressed, Hopeless 3 3 1 2  0 0 0  PHQ - 2 Score 6 6 2 4 2  0 0  Altered sleeping - - - 0 1 - -  Tired, decreased energy - - - 3 3 - -  Change in appetite - - - 3 3 - -  Feeling bad or failure about yourself  - - - 2 0 - -  Trouble concentrating - - - 3 3 - -  Moving slowly or fidgety/restless - - - 3 2 - -  Suicidal thoughts - - - 0 0 - -  PHQ-9 Score - - - 18 14 - -  Difficult doing work/chores - - - Extremely dIfficult Very difficult - -  Some recent data might be hidden   Review of Systems  Constitutional: Negative.   HENT: Negative.   Eyes: Negative.   Respiratory: Negative.   Cardiovascular: Negative.   Gastrointestinal: Negative.   Genitourinary: Negative.   Musculoskeletal:       Spasms  Skin: Negative.     Allergic/Immunologic: Negative.   Neurological: Positive for weakness and numbness.       Tingling  Hematological: Negative.   Psychiatric/Behavioral: Positive for dysphoric mood. The patient is nervous/anxious.   All other systems reviewed and are negative.      Objective:   Physical Exam  Constitutional: She is oriented to person, place, and time. She appears well-developed and well-nourished. No distress.  HENT:  Head: Normocephalic and atraumatic.  Eyes: Pupils are equal, round, and reactive to light. EOM are normal.  Neck: Normal range of motion.  Neurological: She is alert and oriented to person, place, and time.  Skin: Skin is warm and dry. She is not diaphoretic.  Psychiatric: She has a normal mood and affect.  Nursing note and vitals reviewed.   Knees still numb Motor strength is 5/5 bilateral hip flexor knee extensor ankle dorsiflexor Lumbar spine is limited range of motion 50% flexion extension 25% lateral bending rotation not tested. Negative straight leg raising Ambulates with a cane no evidence of toe drag or knee instability    Assessment & Plan:  #1.  Lumbar stenosis no signs of myelopathy.  She does have residual hip pain which appears to be lateral and is probably some trochanteric bursitis related to her immobility. She plans to follow-up with neurosurgery in the next month for x-rays. We discussed that she may benefit from outpatient therapy, she would like to discuss this with her surgeon first. In terms of pain medication for now we will continue hydrocodone 5 mg 4 times daily as well as Lyrica 100 mg 3 times daily  Reviewed PMP aware website no red flags, last UDS appropriate Review controlled substance agreement next month

## 2018-01-16 MED FILL — IBUPROFEN 800 MG TAB: 800 | 5 days supply | Qty: 20 | Fill #0

## 2018-01-16 MED FILL — CLINDAMYCIN HCL 300 MG CAP: 300 | 1 days supply | Qty: 2 | Fill #0

## 2018-01-16 MED FILL — PREGABALIN 100 MG CAPS: 100 | 30 days supply | Qty: 90 | Fill #3

## 2018-01-16 MED FILL — AZITHROMYCIN 250 MG TABLET: 250 | 5 days supply | Qty: 6 | Fill #0

## 2018-01-20 ENCOUNTER — Emergency Department (HOSPITAL_COMMUNITY): Payer: Medicare Other

## 2018-01-20 ENCOUNTER — Encounter (HOSPITAL_COMMUNITY): Payer: Self-pay | Admitting: Emergency Medicine

## 2018-01-20 ENCOUNTER — Emergency Department (HOSPITAL_COMMUNITY)
Admission: EM | Admit: 2018-01-20 | Discharge: 2018-01-20 | Disposition: A | Discharge status: Home / Self Care | Payer: Medicare Other | Attending: Emergency Medicine | Admitting: Emergency Medicine

## 2018-01-20 DIAGNOSIS — Z853 Personal history of malignant neoplasm of breast: Secondary | ICD-10-CM | POA: Insufficient documentation

## 2018-01-20 DIAGNOSIS — K0889 Other specified disorders of teeth and supporting structures: Secondary | ICD-10-CM | POA: Diagnosis not present

## 2018-01-20 DIAGNOSIS — Z79899 Other long term (current) drug therapy: Secondary | ICD-10-CM | POA: Diagnosis not present

## 2018-01-20 DIAGNOSIS — R519 Headache, unspecified: Secondary | ICD-10-CM

## 2018-01-20 DIAGNOSIS — R51 Headache: Secondary | ICD-10-CM | POA: Diagnosis present

## 2018-01-20 LAB — CBC WITH DIFFERENTIAL/PLATELET
Basophils Absolute: 0 10*3/uL (ref 0.0–0.1)
Basophils Relative: 0 %
Eosinophils Absolute: 0.2 10*3/uL (ref 0.0–0.7)
Eosinophils Relative: 4 %
HCT: 38.7 % (ref 36.0–46.0)
Hemoglobin: 12.5 g/dL (ref 12.0–15.0)
Lymphocytes Relative: 53 %
Lymphs Abs: 2 10*3/uL (ref 0.7–4.0)
MCH: 27.7 pg (ref 26.0–34.0)
MCHC: 32.3 g/dL (ref 30.0–36.0)
MCV: 85.6 fL (ref 78.0–100.0)
Monocytes Absolute: 0.3 10*3/uL (ref 0.1–1.0)
Monocytes Relative: 7 %
Neutro Abs: 1.3 10*3/uL — ABNORMAL LOW (ref 1.7–7.7)
Neutrophils Relative %: 36 %
Platelets: 210 10*3/uL (ref 150–400)
RBC: 4.52 MIL/uL (ref 3.87–5.11)
RDW: 13.4 % (ref 11.5–15.5)
WBC: 3.7 10*3/uL — ABNORMAL LOW (ref 4.0–10.5)

## 2018-01-20 LAB — BASIC METABOLIC PANEL
Anion gap: 8 (ref 5–15)
BUN: 17 mg/dL (ref 6–20)
CO2: 31 mmol/L (ref 22–32)
Calcium: 9.5 mg/dL (ref 8.9–10.3)
Chloride: 103 mmol/L (ref 98–111)
Creatinine, Ser: 0.77 mg/dL (ref 0.44–1.00)
GFR calc Af Amer: >60 mL/min (ref >60)
GFR calc non Af Amer: >60 mL/min (ref >60)
Glucose, Bld: 120 mg/dL — ABNORMAL HIGH (ref 70–99)
Potassium: 3.8 mmol/L (ref 3.5–5.1)
Sodium: 142 mmol/L (ref 135–145)

## 2018-01-20 IMAGING — CT CT MAXILLOFACIAL W/ CM
3 series · 14 of 47 positions shown, 16 images · IV contrast (omnipaque)
Comparison: MRI of the head September 15, 2016

CLINICAL DATA: LEFT facial swelling for 4 days, on antibiotics for
3 days without improvement.

EXAM:
CT MAXILLOFACIAL WITH CONTRAST
TECHNIQUE: Multidetector CT imaging of the maxillofacial structures was
performed with intravenous contrast. Multiplanar CT image
reconstructions were also generated.
CONTRAST:  75mL OMNIPAQUE IOHEXOL 300 MG/ML  SOLN

[Series 3: max soft · axial · 0.42mm/px · z∈[+1618,+1736]mm · 8 of 69 slices shown, 10 images]
[im 5/69  brain]
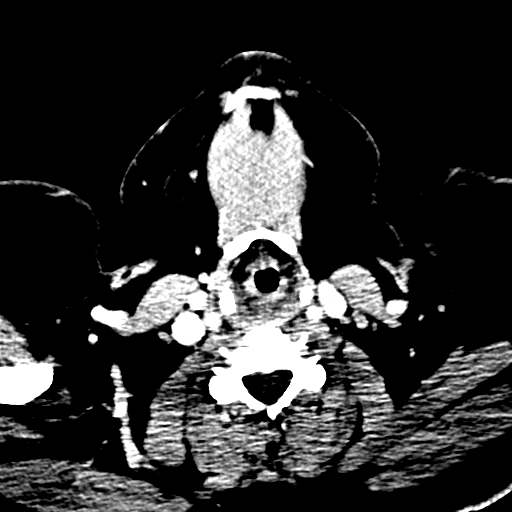
[im 5/69  bone]
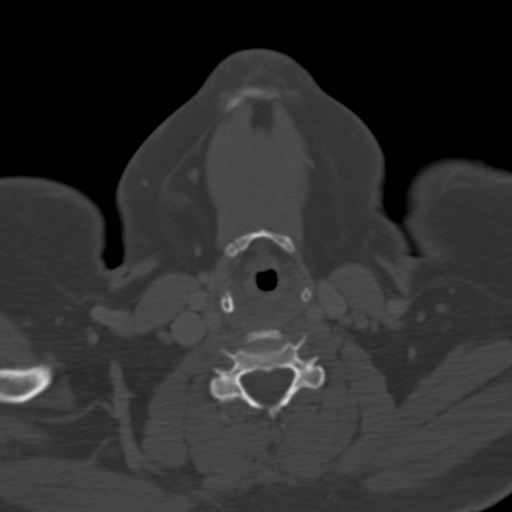
[im 15/69  bone]
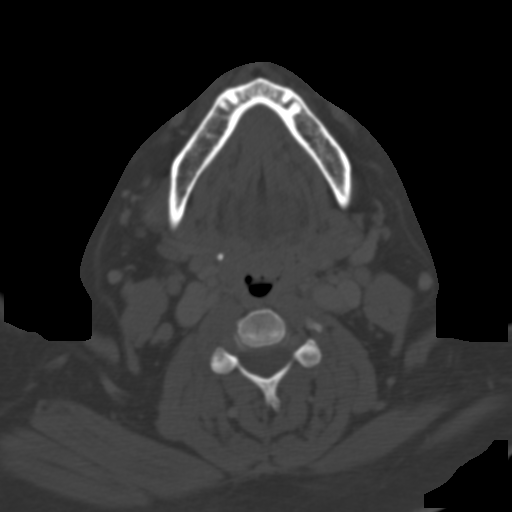
[im 22/69  bone]
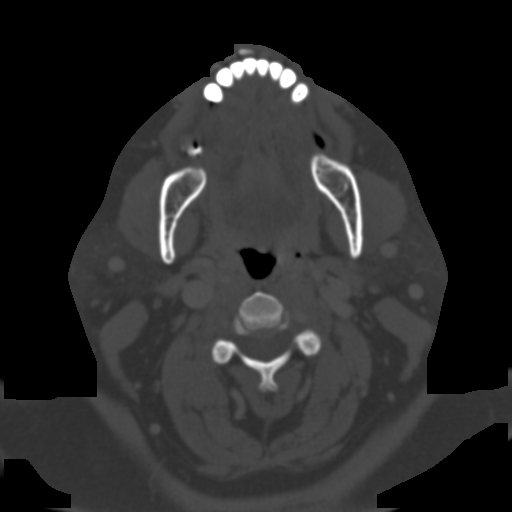
[im 31/69  bone]
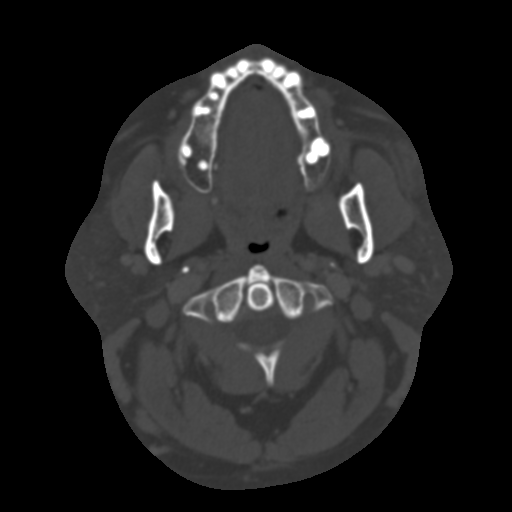
[im 38/69  brain]
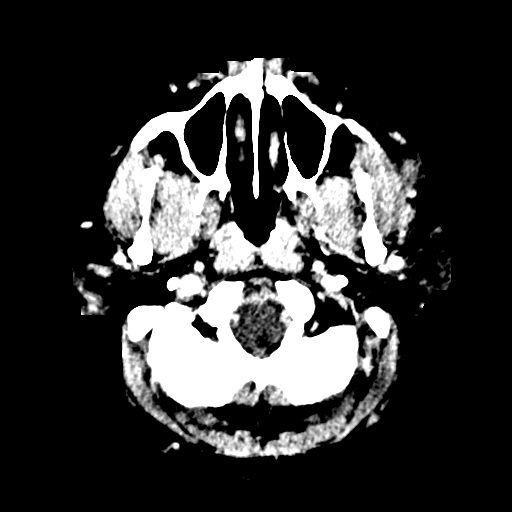
[im 38/69  bone]
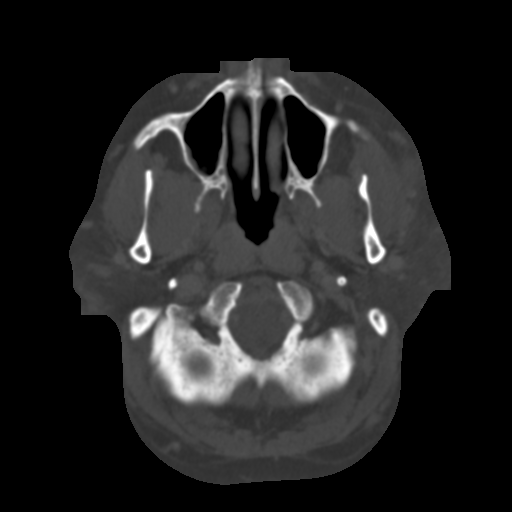
[im 47/69  bone]
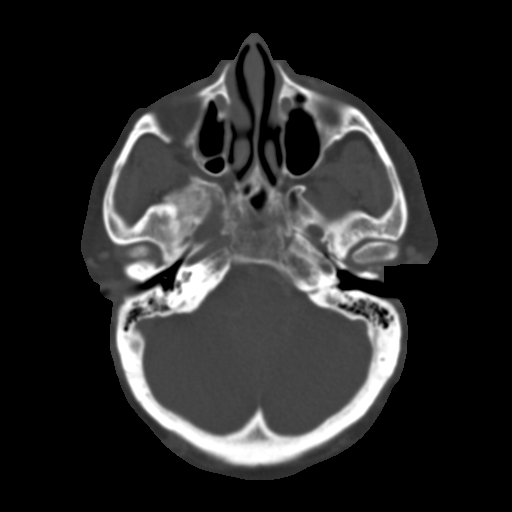
[im 54/69  bone]
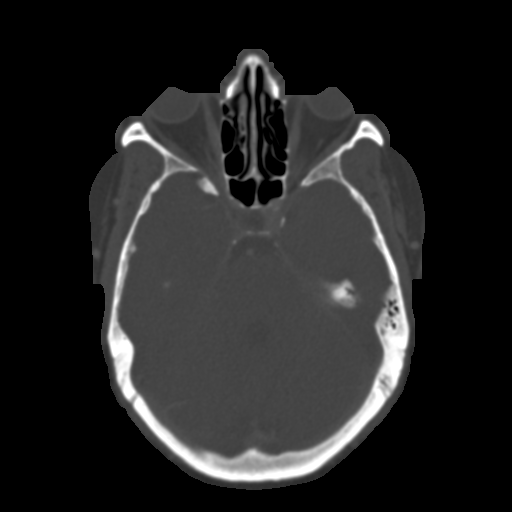
[im 64/69  bone]
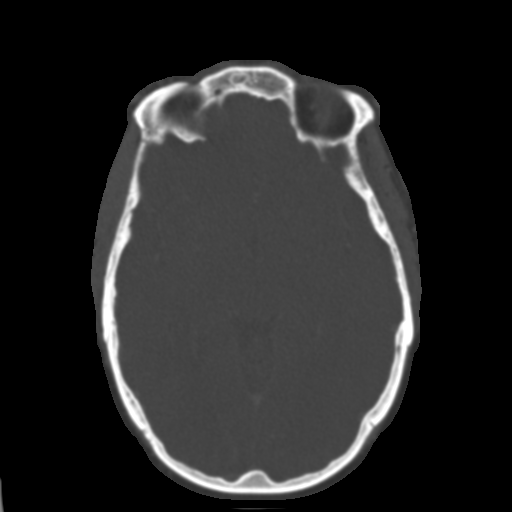

[Series 7: coronal soft · coronal · 0.29mm/px · 3 of 69 slices shown]
[im 23/69  bone]
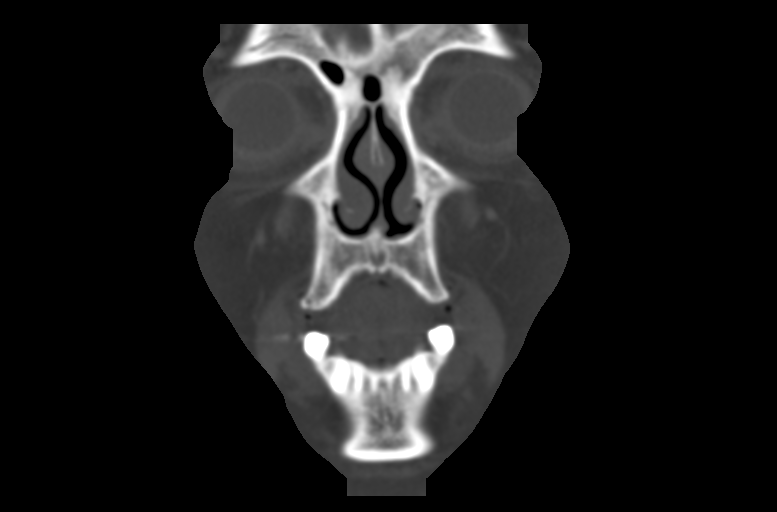
[im 31/69  bone]
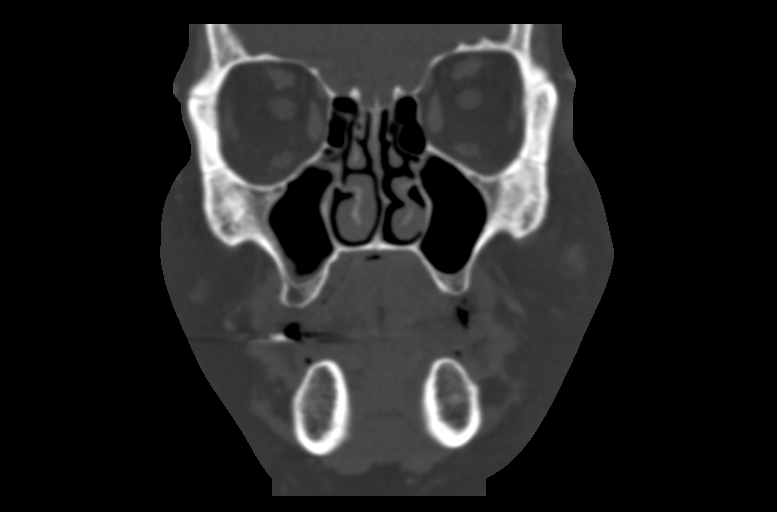
[im 38/69  bone]
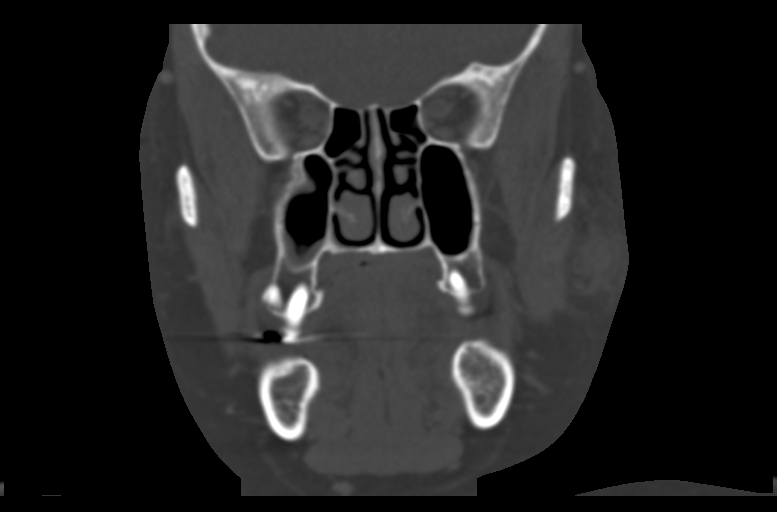

[Series 9: sagittal soft · sagittal · 0.27mm/px · 3 of 99 slices shown]
[im 33/99  bone]
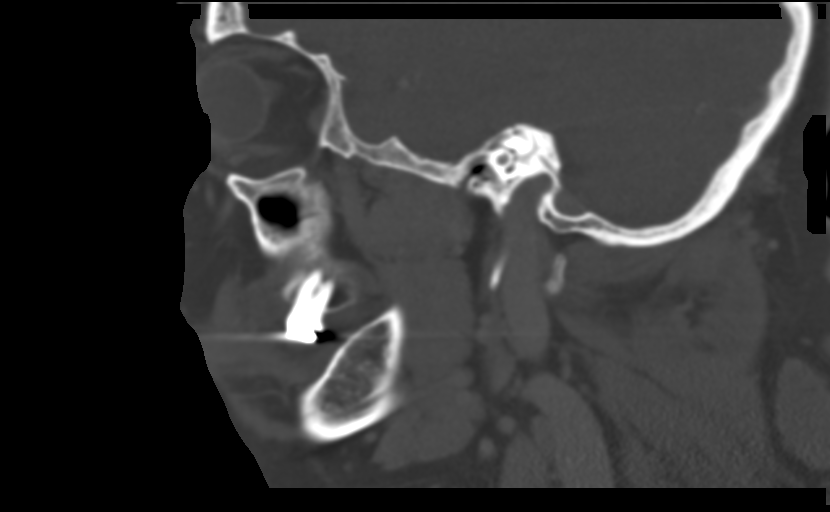
[im 50/99  bone]
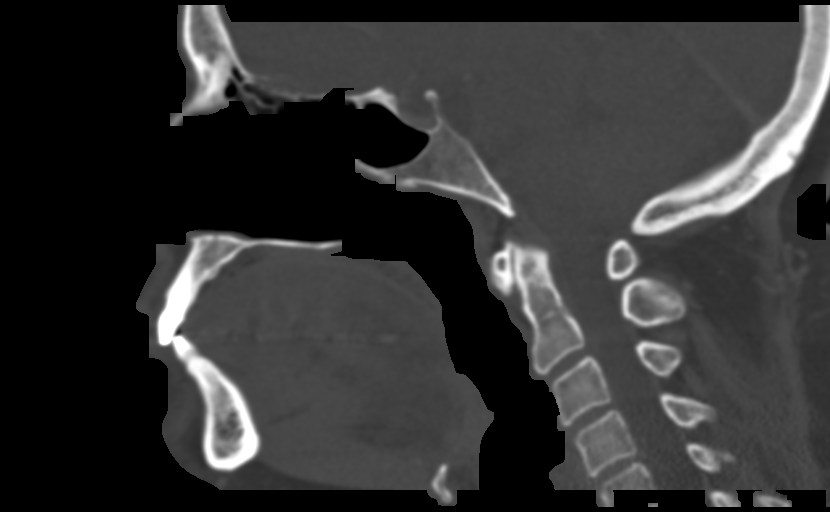
[im 66/99  bone]
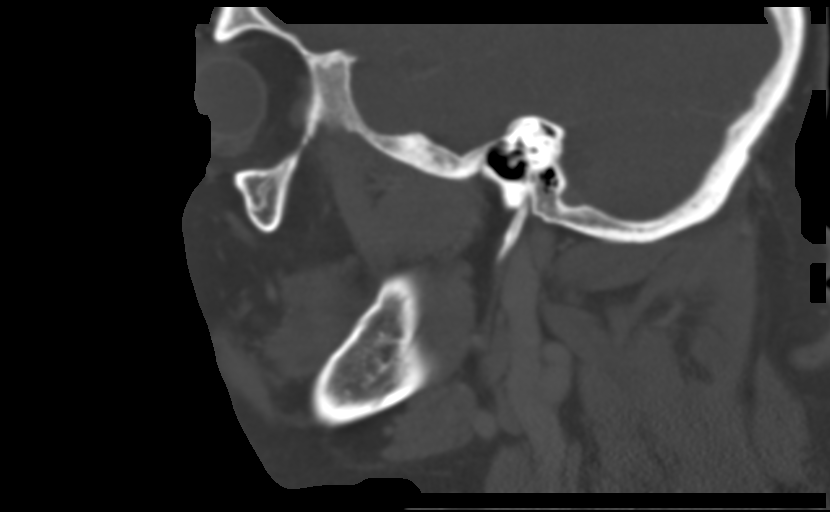

[14 of 47 positions shown; findings below may reference images not displayed]

FINDINGS: OSSEOUS: The mandible is intact, the condyles are located. No acute
facial fracture. No destructive bony lesions. Multiple absent teeth.
No periapical abscess or CT findings of acute dental pathology.

ORBITS: Ocular globes and orbital contents are non suspicious.
Status post bilateral ocular lens implants.

SINUSES: Mild paranasal sinus mucosal thickening without air-fluid
levels.. Nasal septum is midline. Included mastoid aircells are well
aerated.

SOFT TISSUES: Focal inflammatory changes LEFT facial subcutaneous
fat, immediately anterior to the parotid gland which is normal in
appearance. Mild LEFT masseter muscle enlargement and inflammatory
changes. No subcutaneous gas, radiopaque foreign bodies or fluid
collection.

LIMITED INTRACRANIAL: Normal.
IMPRESSION: 1. Focal inflammatory changes LEFT face, differential diagnosis
includes accessory parotid sialadenitis, contusion/fat necrosis.
Associated LEFT masseter myositis/inflammatory changes.

## 2018-01-20 MED ORDER — IOHEXOL 300 MG/ML  SOLN
75.0000 mL | Freq: Once | INTRAMUSCULAR | Status: AC | PRN
  Administered 2018-01-20: 75 mL via INTRAVENOUS

## 2018-01-20 MED ORDER — DOXYCYCLINE HYCLATE 100 MG PO CAPS: 100.0000 mg | ORAL_CAPSULE | Freq: Two times a day (BID) | ORAL | 0 refills | Status: AC

## 2018-01-20 MED ORDER — CEPHALEXIN 500 MG PO CAPS: 500.0000 mg | ORAL_CAPSULE | Freq: Four times a day (QID) | ORAL | 0 refills | Status: DC

## 2018-01-20 MED FILL — DOXYCYCLINE HYCLATE 100 MG: 100 | 5 days supply | Qty: 10 | Fill #0

## 2018-01-20 MED FILL — CEPHALEXIN 500 MG CAPSULE: 500 | 5 days supply | Qty: 20 | Fill #0

## 2018-01-20 NOTE — ED Triage Notes (Signed)
Pt c/o knot on left side of face with swelling since Thursday. Reports saw her dentist on Friday and was started on antibiotics but reports that and pain medications not helping.

## 2018-01-20 NOTE — Discharge Instructions (Addendum)
There is evidence of inflammation on the CT scan.  This can be treated with anti-inflammatory medications, if you were able to take them.  Otherwise Tylenol may be used for pain.  May apply cool compresses to the area multiple times throughout the day. Follow-up with the ear nose and throat specialist. Should symptoms begin to worsen despite the above treatments, you may begin the new set of antibiotics.

## 2018-01-20 NOTE — ED Provider Notes (Signed)
[facility] DEPT Provider Note   CSN: [id number] Arrival date & time: 01/20/18  1660     History   Chief Complaint Chief Complaint  Patient presents with  . Facial Swelling  . Dental Pain    HPI Kelsey Wilson is a 57 y.o. female.  HPI   Kelsey Wilson is a 57 y.o. female, patient with past medical history of GERD and anemia, presenting to the ED with left side facial pain and swelling beginning Aug 15. She states she is also worried about a "knot" on the left side of the face.   States she saw her dentist, Dr. Tasia Catchings, on Aug 16, was told she didn't have an abscess, prescribed a Z-pack, and told her to come back if things did not improve.  She became much more worried and decided to come to the ED instead.  Denies fever/chills, N/V, SOB, ear pain/drainage, or any other complaints.       Past Medical History:  Diagnosis Date  . Anemia   . Anxiety   . Arthritis   . Breast cancer (Kelsey Wilson) 2010   T3N1 invasive ductal carcinoma left breast.Takes Arimidex daily  . Bursitis   . Carpal tunnel syndrome   . Chronic back pain    stenosis  . Constipation    takes Colace daily  . Depression    takes Benzotropine daily  . Diverticulitis of colon   . Dyspnea    daily when walking for over 1 yr.  . Fibromyalgia 08/2012  . GERD (gastroesophageal reflux disease)    takes Dexilant daily  . Hemorrhoid   . History of blood transfusion    no abnormal reaction noted  . History of colon polyps    benign  . History of shingles   . Joint pain   . Joint swelling   . Night muscle spasms    takes Flexeril nightly as needed  . Nocturia   . OSA (obstructive sleep apnea)   . OSA on CPAP   . Peripheral edema    takes Furosemide.Just started 01/18/16  . Peripheral neuropathy    takes Lyrica daily  . Personal history of chemotherapy    2013  . Personal history of radiation therapy    2013  . Pneumonia    hx of-2015  . Pseudoarthrosis of lumbar  spine   . Seasonal allergies    takes Singulair nightly  . SOB (shortness of breath) on exertion    rarely with exertion  . 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 Alphonzo Grieve)    Patient Active Problem List   Diagnosis Date Noted  . Lung nodule 09/15/2017  . Morbid obesity with BMI of 45.0-49.9, adult ([place]) 09/05/2017  . 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 Vertigo 10/12/2016  . Neuropathic pain 06/25/2016  . 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 S/P lumbar spinal fusion 01/27/2016  . H/O therapeutic radiation 09/21/2015  . Pes planus of both feet 08/04/2015  . Primary osteoarthritis of right knee 03/10/2015  . Osteoarthritis of right knee 02/04/2015  . Primary osteoarthritis of left knee 09/13/2014  . Hot flashes 01/19/2014  . Abdominal pain, unspecified site 01/19/2014  . Malignant neoplasm of upper-outer quadrant of left breast in female, estrogen receptor positive ([place]) 03/19/2013  . Lumbosacral spondylosis without myelopathy 12/30/2012  . Fibromyalgia syndrome 08/15/2012  . Peripheral neuropathy, toxic 08/15/2012  .  Lymphedema of arm - left 04/30/2012    Past Surgical History:  Procedure Laterality Date  . ABDOMINAL HYSTERECTOMY     still has ovaries  . APPENDECTOMY    . AXILLARY LYMPH NODE DISSECTION  11/28/2011   Procedure: AXILLARY LYMPH NODE DISSECTION;  Surgeon: Edward Jolly, MD;  Location: [facility];  Service: General;  Laterality: Left;  . BREAST LUMPECTOMY Left 11/28/2011   Malignant  . BREAST SURGERY Left 2013  . CARPAL TUNNEL RELEASE     Bilateral  . CESAREAN SECTION     pt. has had 3  . CHOLECYSTECTOMY    . COLONOSCOPY    . EYE SURGERY Bilateral    cataract removal  . KNEE SURGERY     Left Knee  . LAMINECTOMY WITH POSTERIOR LATERAL ARTHRODESIS LEVEL 1 N/A 11/29/2017   Procedure: Posterior Lateral  Fusion - Lumbar Four-Lumbar Five, removal and replacement of hardware, Laminectomy - Lumbar Four-Lumbar Five;  Surgeon: Eustace Moore, MD;  Location: [facility] OR;  Service: Neurosurgery;  Laterality: N/A;  . LAMINECTOMY WITH POSTERIOR LATERAL ARTHRODESIS LEVEL 2 N/A 06/07/2017   Procedure: Posterior Lateral Fusion Lumbar Three-Four and Transforaminal Interbody Fusion Lumbar Four-Five with Segmental  Pedicle Screw Fixation;  Surgeon: Eustace Moore, MD;  Location: [facility];  Service: Neurosurgery;  Laterality: N/A;  Posterior Lateral Fusion Lumbar Three-Four and Transforaminal Interbody Fusion Lumbar Four-Five with Segmental  Pedicle Screw Fixation   . LUMBAR FUSION  06/07/2017   POST  . LUMBAR LAMINECTOMY/DECOMPRESSION MICRODISCECTOMY Bilateral 01/27/2016   Procedure: Laminectomy and Foraminotomy - Lumbar four -Lumbar five - bilateral- on-lay noninstrumented fusion;  Surgeon: Eustace Moore, MD;  Location: [facility];  Service: Neurosurgery;  Laterality: Bilateral;  . MULTIPLE EXTRACTIONS WITH ALVEOLOPLASTY N/A 05/11/2016   Procedure: EXTRACTION OF TEETH EIGHTEEN, TWENTY AND TWENTY- NINE;  REMOVAL OF MANDIBULAR TORUS AND EXOSTOSIS;  Surgeon: Diona Browner, DDS;  Location: [facility];  Service: Oral Surgery;  Laterality: N/A;  . PORTACATH PLACEMENT  06/18/2011   Procedure: INSERTION PORT-A-CATH;  Surgeon: Edward Jolly, MD;  Location: [facility];  Service: General;  Laterality: Right;  right subclavian  . removal portacath  2014  . spur     Apex spur on both big toes  . TOE SURGERY Bilateral   . TONSILLECTOMY    . TOTAL KNEE ARTHROPLASTY Left 09/13/2014  . TOTAL KNEE ARTHROPLASTY Left 09/13/2014   Procedure: LEFT TOTAL KNEE ARTHROPLASTY;  Surgeon: Rod Can, MD;  Location: [facility];  Service: Orthopedics;  Laterality: Left;  . TOTAL KNEE ARTHROPLASTY Right 03/10/2015   Procedure: RIGHT TOTAL KNEE ARTHROPLASTY;  Surgeon: Rod Can, MD;  Location: [facility];  Service: Orthopedics;  Laterality:  Right;     OB History   None      Home Medications    Prior to Admission medications   Medication Sig Start Date End Date Taking? Authorizing Provider  anastrozole (ARIMIDEX) 1 MG tablet TAKE 1 TABLET BY MOUTH ONCE DAILY 09/25/17  Yes Causey, Charlestine Massed, NP  azithromycin (ZITHROMAX) 250 MG tablet Take 250-500 mg by mouth daily. Take 2 tablets on day one and 1 tablet for 4 more days   Yes [provider]  benzonatate (TESSALON) 100 MG capsule Take 100 mg by mouth 4 (four) times daily as needed for cough.    Yes [provider]  Dexlansoprazole (DEXILANT) 30 MG capsule Take 30 mg by mouth daily.   Yes [provider]  DULoxetine (CYMBALTA) 60 MG capsule Take 60  mg by mouth daily.  06/28/16  Yes [provider]  HYDROcodone-acetaminophen (NORCO/VICODIN) 5-325 MG tablet Take 1 tablet by mouth every 6 (six) hours as needed for moderate pain or severe pain. 01/13/18  Yes Kirsteins, Luanna Salk, MD  ibuprofen (ADVIL,MOTRIN) 800 MG tablet Take 800 mg by mouth every 8 (eight) hours as needed for moderate pain.   Yes [provider]  levocetirizine (XYZAL) 5 MG tablet Take 5 mg by mouth daily.   Yes [provider]  methocarbamol (ROBAXIN) 500 MG tablet Take 1 tablet (500 mg total) by mouth every 6 (six) hours as needed for muscle spasms. 12/02/17  Yes Eustace Moore, MD  montelukast (SINGULAIR) 10 MG tablet Take 10 mg by mouth at bedtime.   Yes [provider]  ondansetron (ZOFRAN) 4 MG tablet Take 4 mg by mouth every 8 (eight) hours as needed for nausea or vomiting.   Yes [provider]  pregabalin (LYRICA) 100 MG capsule Take 1 capsule (100 mg total) by mouth 3 (three) times daily. 08/12/17  Yes Kirsteins, Luanna Salk, MD  Valbenazine Tosylate [facility]) 40 MG CAPS Take 40 mg by mouth daily.   Yes [provider]  cephALEXin (KEFLEX) 500 MG capsule Take 1 capsule (500 mg total) by mouth 4 (four) times daily. 01/20/18   Jeris Roser,  Eveleen Mcnear C, PA-C  clindamycin (CLEOCIN) 300 MG capsule Take 600 mg by mouth See admin instructions. Take 600 mg by mouth 1 hour prior to dental procedures 02/15/16   [provider]  doxycycline (VIBRAMYCIN) 100 MG capsule Take 1 capsule (100 mg total) by mouth 2 (two) times daily for 5 days. 01/20/18 01/25/18  Mikenna Bunkley, Helane Gunther, PA-C  promethazine (PHENERGAN) 12.5 MG suppository Place 1 suppository (12.5 mg total) rectally every 12 (twelve) hours as needed for nausea or vomiting. Patient not taking: Reported on 01/20/2018 10/16/17   Willia Craze, NP    Family History Family History  Problem Relation Age of Onset  . Hypertension Mother   . Diabetes Mother   . Hypertension Father   . Diabetes Father   . Cancer Paternal Grandmother        unknown  . Colon cancer Neg Hx     Social History Social History   Tobacco Use  . Smoking status: Never Smoker  . Smokeless tobacco: Never Used  Substance Use Topics  . Alcohol use: No    Alcohol/week: 0.0 standard drinks  . Drug use: No     Allergies   Aleve [naproxen]; Hydrocodone; Oxycodone; Penicillins; and Prochlorperazine   Review of Systems Review of Systems  Constitutional: Negative for chills and fever.  HENT: Positive for facial swelling.   Eyes: Negative for visual disturbance.  Respiratory: Negative for shortness of breath.   Cardiovascular: Negative for chest pain.  Gastrointestinal: Negative for abdominal pain, diarrhea, nausea and vomiting.  Neurological: Negative for dizziness, weakness, light-headedness, numbness and headaches.  All other systems reviewed and are negative.    Physical Exam Updated Vital Signs BP (!) 176/106 (BP Location: Right Arm)   Pulse 89   Temp 98.2 F (36.8 C) (Oral)   Resp 18   SpO2 96%   Physical Exam  Constitutional: She appears well-developed and well-nourished. No distress.  HENT:  Head: Normocephalic and atraumatic.  Tenderness, induration, and some swelling in the area  indicated.  No noted erythema or increased warmth.  Eyes: Pupils are equal, round, and reactive to light. Conjunctivae and EOM are normal.  Neck: Neck supple.  Cardiovascular: Normal rate, regular rhythm, normal heart sounds and intact distal pulses.  Pulmonary/Chest: Effort normal and breath sounds normal. No respiratory distress.  Abdominal: Soft. There is no tenderness. There is no guarding.  Musculoskeletal: She exhibits no edema.  Lymphadenopathy:    She has no cervical adenopathy.  Neurological: She is alert.  Sensation grossly intact to light touch in the extremities. Strength 5/5 in all extremities. No gait disturbance. Coordination intact. Cranial nerves III-XII grossly intact. No facial droop.   Skin: Skin is warm and dry. She is not diaphoretic.  Psychiatric: She has a normal mood and affect. Her behavior is normal.  Nursing note and vitals reviewed.    ED Treatments / Results  Labs (all labs ordered are listed, but only abnormal results are displayed) Labs Reviewed  BASIC METABOLIC PANEL - Abnormal; Notable for the following components:      Result Value   Glucose, Bld 120 (*)    All other components within normal limits  CBC WITH DIFFERENTIAL/PLATELET - Abnormal; Notable for the following components:   WBC 3.7 (*)    Neutro Abs 1.3 (*)    All other components within normal limits    EKG None  Radiology Ct Maxillofacial W Contrast  Result Date: 01/20/2018 CLINICAL DATA:  LEFT facial swelling for 4 days, on antibiotics for 3 days without improvement. EXAM: CT MAXILLOFACIAL WITH CONTRAST TECHNIQUE: Multidetector CT imaging of the maxillofacial structures was performed with intravenous contrast. Multiplanar CT image reconstructions were also generated. CONTRAST:  50mL OMNIPAQUE IOHEXOL 300 MG/ML  SOLN COMPARISON:  MRI of the head September 15, 2016 FINDINGS: OSSEOUS: The mandible is intact, the condyles are located. No acute facial fracture. No destructive bony lesions.  Multiple absent teeth. No periapical abscess or CT findings of acute dental pathology. ORBITS: Ocular globes and orbital contents are non suspicious. Status post bilateral ocular lens implants. SINUSES: Mild paranasal sinus mucosal thickening without air-fluid levels. Nasal septum is midline. Included mastoid aircells are well aerated. SOFT TISSUES: Focal inflammatory changes LEFT facial subcutaneous fat, immediately anterior to the parotid gland which is normal in appearance. Mild LEFT masseter muscle enlargement and inflammatory changes. No subcutaneous gas, radiopaque foreign bodies or fluid collection. LIMITED INTRACRANIAL: Normal. IMPRESSION: 1. Focal inflammatory changes LEFT face, differential diagnosis includes accessory parotid sialadenitis, contusion/fat necrosis. Associated LEFT masseter myositis/inflammatory changes. Electronically Signed   By: Elon Alas M.D.   On: 01/20/2018 13:48    Procedures Procedures (including critical care time)  Medications Ordered in ED Medications  iohexol (OMNIPAQUE) 300 MG/ML solution 75 mL (75 mLs Intravenous Contrast Given 01/20/18 1303)     Initial Impression / Assessment and Plan / ED Course  I have reviewed the triage vital signs and the nursing notes.  Pertinent labs & imaging results that were available during my care of the patient were reviewed by me and considered in my medical decision making (see chart for details).  Clinical Course as of Jan 21 1656  Mon Jan 20, 2018  1045 Spoke with Dr. Tasia Catchings. States she does not have any other ideas about the patient's care since it doesn't seem to be coming from the teeth. Recommends referring to oral surgery.    [SJ]    Clinical Course User Index [SJ] Yida Hyams C, PA-C    Patient presents with left facial pain and swelling.  CT shows inflammation without evidence of abscess or cellulitis.  ENT follow-up. The patient was given instructions for home care as well as return precautions.  We  discussed "watch and wait" in regards to the antibiotics.  Patient voices understanding of these instructions, accepts the plan, and is comfortable with discharge.  Findings and plan of care discussed with Varney Biles, MD. Dr. Kathrynn Humble personally evaluated and examined this patient.  Vitals:   01/20/18 0837 01/20/18 1444  BP: (!) 176/106 (!) 143/84  Pulse: 89 77  Resp: 18 18  Temp: 98.2 F (36.8 C)   TempSrc: Oral   SpO2: 96% 97%     Final Clinical Impressions(s) / ED Diagnoses   Final diagnoses:  Facial pain    ED Discharge Orders         Ordered    cephALEXin (KEFLEX) 500 MG capsule  4 times daily     01/20/18 1422    doxycycline (VIBRAMYCIN) 100 MG capsule  2 times daily     01/20/18 [place], Dinero Chavira C, PA-C 01/20/18 [place], Ankit, MD 01/23/18 2328

## 2018-01-23 DIAGNOSIS — K122 Cellulitis and abscess of mouth: Secondary | ICD-10-CM | POA: Insufficient documentation

## 2018-02-02 ENCOUNTER — Other Ambulatory Visit: Payer: Self-pay | Admitting: Registered Nurse

## 2018-02-07 MED FILL — ANASTROZOLE 1 MG TABLET: 1 | 30 days supply | Qty: 30 | Fill #4

## 2018-02-11 ENCOUNTER — Encounter: Payer: Self-pay | Admitting: Registered Nurse

## 2018-02-11 ENCOUNTER — Encounter: Payer: Medicare Other | Attending: Physical Medicine & Rehabilitation | Admitting: Registered Nurse

## 2018-02-11 VITALS — BP 120/77 | HR 79 | Ht 63.0 in | Wt 291.6 lb

## 2018-02-11 DIAGNOSIS — M48062 Spinal stenosis, lumbar region with neurogenic claudication: Secondary | ICD-10-CM

## 2018-02-11 DIAGNOSIS — G8929 Other chronic pain: Secondary | ICD-10-CM | POA: Diagnosis not present

## 2018-02-11 DIAGNOSIS — M1712 Unilateral primary osteoarthritis, left knee: Secondary | ICD-10-CM

## 2018-02-11 DIAGNOSIS — M797 Fibromyalgia: Secondary | ICD-10-CM | POA: Diagnosis not present

## 2018-02-11 DIAGNOSIS — K219 Gastro-esophageal reflux disease without esophagitis: Secondary | ICD-10-CM | POA: Diagnosis not present

## 2018-02-11 DIAGNOSIS — F329 Major depressive disorder, single episode, unspecified: Secondary | ICD-10-CM | POA: Diagnosis not present

## 2018-02-11 DIAGNOSIS — Z981 Arthrodesis status: Secondary | ICD-10-CM | POA: Diagnosis not present

## 2018-02-11 DIAGNOSIS — M545 Low back pain: Secondary | ICD-10-CM | POA: Diagnosis present

## 2018-02-11 DIAGNOSIS — M5416 Radiculopathy, lumbar region: Secondary | ICD-10-CM

## 2018-02-11 DIAGNOSIS — Z9889 Other specified postprocedural states: Secondary | ICD-10-CM | POA: Diagnosis not present

## 2018-02-11 DIAGNOSIS — G894 Chronic pain syndrome: Secondary | ICD-10-CM

## 2018-02-11 DIAGNOSIS — G8918 Other acute postprocedural pain: Secondary | ICD-10-CM | POA: Diagnosis not present

## 2018-02-11 DIAGNOSIS — Z853 Personal history of malignant neoplasm of breast: Secondary | ICD-10-CM | POA: Diagnosis not present

## 2018-02-11 DIAGNOSIS — M48061 Spinal stenosis, lumbar region without neurogenic claudication: Secondary | ICD-10-CM | POA: Insufficient documentation

## 2018-02-11 DIAGNOSIS — Z5181 Encounter for therapeutic drug level monitoring: Secondary | ICD-10-CM

## 2018-02-11 DIAGNOSIS — Z79891 Long term (current) use of opiate analgesic: Secondary | ICD-10-CM

## 2018-02-11 DIAGNOSIS — G4733 Obstructive sleep apnea (adult) (pediatric): Secondary | ICD-10-CM | POA: Insufficient documentation

## 2018-02-11 DIAGNOSIS — M1711 Unilateral primary osteoarthritis, right knee: Secondary | ICD-10-CM

## 2018-02-11 MED ORDER — CYCLOBENZAPRINE HCL 5 MG PO TABS: 5.0000 mg | ORAL_TABLET | Freq: Two times a day (BID) | ORAL | 0 refills | Status: DC | PRN

## 2018-02-11 MED ORDER — PREGABALIN 100 MG PO CAPS: 100.0000 mg | ORAL_CAPSULE | Freq: Three times a day (TID) | ORAL | 5 refills | Status: DC

## 2018-02-11 NOTE — Progress Notes (Signed)
Subjective:    Patient ID: Kelsey Wilson, female    DOB: April 25, 1961, 57 y.o.   MRN: [id number]  HPI: Kelsey Wilson is a 57 year old female who returns for follow up appointment for chronic pain and medication refill. She states her pain is located in her lower back radiating into her right buttockand bilateral knee pain She rates her pain 6. Her current exercise regimen is walking and performing stretching exercises.   [name] Morphine Equivalent is 20.00 MME. Last Oral Swab was on 11/21/2017, it as consistent.   Pain Inventory Average Pain 7 Pain Right Now 6 My pain is aching  In the last 24 hours, has pain interfered with the following? General activity 7 Relation with others 7 Enjoyment of life 7 What TIME of day is your pain at its worst? daytime Sleep (in general) Fair  Pain is worse with: walking, bending, sitting and standing Pain improves with: rest and medication Relief from Meds: 5  Mobility use a cane ability to climb steps?  no do you drive?  yes  Function I need assistance with the following:  household duties and shopping  Neuro/Psych numbness trouble walking spasms depression anxiety  Prior Studies Any changes since last visit?  no  Physicians involved in your care Any changes since last visit?  no   Family History  Problem Relation Age of Onset  . Hypertension Mother   . Diabetes Mother   . Hypertension Father   . Diabetes Father   . Cancer Paternal Grandmother        unknown  . Colon cancer Neg Hx    Social History   Socioeconomic History  . Marital status: Single    Spouse name: Not on file  . Number of children: 3  . Years of education: Not on file  . Highest education level: Not on file  Occupational History  . Not on file  Social Needs  . Financial resource strain: Not on file  . Food insecurity:    Worry: Not on file    Inability: Not on file  . Transportation needs:    Medical: Not on file    Non-medical:  Not on file  Tobacco Use  . Smoking status: Never Smoker  . Smokeless tobacco: Never Used  Substance and Sexual Activity  . Alcohol use: No    Alcohol/week: 0.0 standard drinks  . Drug use: No  . Sexual activity: Never    Birth control/protection: Post-menopausal, Surgical  Lifestyle  . Physical activity:    Days per week: Not on file    Minutes per session: Not on file  . Stress: Not on file  Relationships  . Social connections:    Talks on phone: Not on file    Gets together: Not on file    Attends religious service: Not on file    Active member of club or organization: Not on file    Attends meetings of clubs or organizations: Not on file    Relationship status: Not on file  Other Topics Concern  . Not on file  Social History Narrative  . Not on file   Past Surgical History:  Procedure Laterality Date  . ABDOMINAL HYSTERECTOMY     still has ovaries  . APPENDECTOMY    . AXILLARY LYMPH NODE DISSECTION  11/28/2011   Procedure: AXILLARY LYMPH NODE DISSECTION;  Surgeon: Edward Jolly, MD;  Location: [place];  Service: General;  Laterality: Left;  . BREAST LUMPECTOMY  Left 11/28/2011   Malignant  . BREAST SURGERY Left 2013  . CARPAL TUNNEL RELEASE     Bilateral  . CESAREAN SECTION     pt. has had 3  . CHOLECYSTECTOMY    . COLONOSCOPY    . EYE SURGERY Bilateral    cataract removal  . KNEE SURGERY     Left Knee  . LAMINECTOMY WITH POSTERIOR LATERAL ARTHRODESIS LEVEL 1 N/A 11/29/2017   Procedure: Posterior Lateral Fusion - Lumbar Four-Lumbar Five, removal and replacement of hardware, Laminectomy - Lumbar Four-Lumbar Five;  Surgeon: Eustace Moore, MD;  Location: [facility] OR;  Service: Neurosurgery;  Laterality: N/A;  . LAMINECTOMY WITH POSTERIOR LATERAL ARTHRODESIS LEVEL 2 N/A 06/07/2017   Procedure: Posterior Lateral Fusion Lumbar Three-Four and Transforaminal Interbody Fusion Lumbar Four-Five with Segmental  Pedicle Screw Fixation;  Surgeon: Eustace Moore, MD;  Location: [place];  Service: Neurosurgery;  Laterality: N/A;  Posterior Lateral Fusion Lumbar Three-Four and Transforaminal Interbody Fusion Lumbar Four-Five with Segmental  Pedicle Screw Fixation   . LUMBAR FUSION  06/07/2017   POST  . LUMBAR LAMINECTOMY/DECOMPRESSION MICRODISCECTOMY Bilateral 01/27/2016   Procedure: Laminectomy and Foraminotomy - Lumbar four -Lumbar five - bilateral- on-lay noninstrumented fusion;  Surgeon: Eustace Moore, MD;  Location: [place] [facility];  Service: Neurosurgery;  Laterality: Bilateral;  . MULTIPLE EXTRACTIONS WITH ALVEOLOPLASTY N/A 05/11/2016   Procedure: EXTRACTION OF TEETH EIGHTEEN, TWENTY AND TWENTY- NINE;  REMOVAL OF MANDIBULAR TORUS AND EXOSTOSIS;  Surgeon: Diona Browner, DDS;  Location: [place];  Service: Oral Surgery;  Laterality: N/A;  . PORTACATH PLACEMENT  06/18/2011   Procedure: INSERTION PORT-A-CATH;  Surgeon: Edward Jolly, MD;  Location: [place];  Service: General;  Laterality: Right;  right subclavian  . removal portacath  2014  . spur     Apex spur on both big toes  . TOE SURGERY Bilateral   . TONSILLECTOMY    . TOTAL KNEE ARTHROPLASTY Left 09/13/2014  . TOTAL KNEE ARTHROPLASTY Left 09/13/2014   Procedure: LEFT TOTAL KNEE ARTHROPLASTY;  Surgeon: Rod Can, MD;  Location: [place];  Service: Orthopedics;  Laterality: Left;  . TOTAL KNEE ARTHROPLASTY Right 03/10/2015   Procedure: RIGHT TOTAL KNEE ARTHROPLASTY;  Surgeon: Rod Can, MD;  Location: [facility];  Service: Orthopedics;  Laterality: Right;   Past Medical History:  Diagnosis Date  . Anemia   . Anxiety   . Arthritis   . Breast cancer ([place]) 2010   T3N1 invasive ductal carcinoma left breast.Takes Arimidex daily  . Bursitis   . Carpal tunnel syndrome   . Chronic back pain    stenosis  . Constipation    takes Colace daily  . Depression    takes Benzotropine daily  . Diverticulitis of colon   . Dyspnea    daily when walking for over 1 yr.  . Fibromyalgia 08/2012  . GERD  (gastroesophageal reflux disease)    takes Dexilant daily  . Hemorrhoid   . History of blood transfusion    no abnormal reaction noted  . History of colon polyps    benign  . History of shingles   . Joint pain   . Joint swelling   . Night muscle spasms    takes Flexeril nightly as needed  . Nocturia   . OSA (obstructive sleep apnea)   . OSA on CPAP   . Peripheral edema    takes Furosemide.Just started 01/18/16  . Peripheral neuropathy    takes Lyrica daily  .  Personal history of chemotherapy    2013  . Personal history of radiation therapy    2013  . Pneumonia    hx of-2015  . Pseudoarthrosis of lumbar spine   . Seasonal allergies    takes Singulair nightly  . SOB (shortness of breath) on exertion    rarely with exertion  . 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 Alphonzo Grieve)   There were no vitals taken for this visit.  Opioid Risk Score:   Fall Risk Score:  `1  Depression screen PHQ 2/9  Depression screen [facility] 2/9 01/13/2018 12/13/2017 06/21/2017 03/07/2017 08/23/2015 04/26/2015 02/04/2015  Decreased Interest 3 3 1 2 2  0 0  Down, Depressed, Hopeless 3 3 1 2  0 0 0  PHQ - 2 Score 6 6 2 4 2  0 0  Altered sleeping - - - 0 1 - -  Tired, decreased energy - - - 3 3 - -  Change in appetite - - - 3 3 - -  Feeling bad or failure about yourself  - - - 2 0 - -  Trouble concentrating - - - 3 3 - -  Moving slowly or fidgety/restless - - - 3 2 - -  Suicidal thoughts - - - 0 0 - -  PHQ-9 Score - - - 18 14 - -  Difficult doing work/chores - - - Extremely dIfficult Very difficult - -  Some recent data might be hidden     Review of Systems  Constitutional: Positive for diaphoresis and unexpected weight change.  HENT: Negative.   Eyes: Negative.   Respiratory: Negative.   Cardiovascular: Negative.   Gastrointestinal: Negative.   Endocrine: Negative.   Genitourinary:  Negative.   Musculoskeletal: Positive for arthralgias, back pain, gait problem, joint swelling and myalgias.  Skin: Negative.   Allergic/Immunologic: Negative.   Neurological: Positive for numbness.  Hematological: Negative.   Psychiatric/Behavioral: Positive for dysphoric mood. The patient is nervous/anxious.   All other systems reviewed and are negative.      Objective:   Physical Exam  Constitutional: She is oriented to person, place, and time. She appears well-developed and well-nourished.  HENT:  Head: Normocephalic and atraumatic.  Neck: Normal range of motion. Neck supple.  Cardiovascular: Normal rate and regular rhythm.  Pulmonary/Chest: Effort normal and breath sounds normal.  Musculoskeletal:  Normal Muscle Bulk and Muscle Testing Reveals: Upper Extremities: Full ROM and Muscle Strength 5/5 Lumbar Paraspinal Tenderness: L-3-L-5 Mainly Right Side Lower Extremities: Decreased ROM and Muscle Strength 4/5 Bilateral Lower Extremities Flexion Produces Pain into Bilateral Patella's Arises from Table Slowly Narrow Based gait  Neurological: She is alert and oriented to person, place, and time.  Skin: Skin is warm and dry.  Psychiatric: She has a normal mood and affect. Her behavior is normal.  Nursing note and vitals reviewed.         Assessment & Plan:  1. Lumbar Postlaminectomy/ S/P Lumbar Fusion:Spinal Stenosis: S/P Posterior Lateral Fusion L-4-L-5 removal and replacement of hardware, Laminectomy L4-L5 by Dr. Ronnald Ramp on 11/29/2017.  Continue HEP as Tolerated and Continue current medication regime. Hydrocodone 5/325 mg one tablet every 6 hours as needed. 02/11/2018 2. Lumbar Radiculitis: Continue current medication regime with Lyrica.02/11/2018 3. Fibromyalgia:Encouraged to Increase HEP as Tolerated.Continue Current Medication Regimen with Lyrica. 02/11/2018 4. Bilateral Greater Trochanter Bursitis:No complaints today.Continue to Alternate Ice and Heat  Therapy.02/11/2018. 5. Muscle Spasm:Continue:Robaxin.Continue to Monitor. 02/11/2018.  6. Chronic Pain Syndrome: Continue Current medication regimen and HEP as Tolerated. Continue to Monitor. 02/11/2018. 7. Bilateral OA of Bilateral Knees: Orthopedist following.  Continue to Monitor. 02/11/2018. 8. Bilateral Hand Pain: No complaints today. Continue to Monitor/ ? OA: Continue to Monitor.02/11/2018  20 minutes of Face to Face Patient Care time was spent during this visit. All questions encouraged and answered,   F/U in 1 month

## 2018-03-05 ENCOUNTER — Encounter: Payer: Medicare Other | Admitting: Registered Nurse

## 2018-03-06 ENCOUNTER — Encounter: Payer: Medicare Other | Attending: Physical Medicine & Rehabilitation | Admitting: Registered Nurse

## 2018-03-06 ENCOUNTER — Encounter: Payer: Self-pay | Admitting: Registered Nurse

## 2018-03-06 VITALS — BP 129/86 | HR 82 | Ht 63.0 in | Wt 292.0 lb

## 2018-03-06 DIAGNOSIS — M5416 Radiculopathy, lumbar region: Secondary | ICD-10-CM | POA: Diagnosis not present

## 2018-03-06 DIAGNOSIS — Z5181 Encounter for therapeutic drug level monitoring: Secondary | ICD-10-CM

## 2018-03-06 DIAGNOSIS — M797 Fibromyalgia: Secondary | ICD-10-CM | POA: Diagnosis not present

## 2018-03-06 DIAGNOSIS — F329 Major depressive disorder, single episode, unspecified: Secondary | ICD-10-CM | POA: Insufficient documentation

## 2018-03-06 DIAGNOSIS — M1712 Unilateral primary osteoarthritis, left knee: Secondary | ICD-10-CM

## 2018-03-06 DIAGNOSIS — M62838 Other muscle spasm: Secondary | ICD-10-CM

## 2018-03-06 DIAGNOSIS — M48062 Spinal stenosis, lumbar region with neurogenic claudication: Secondary | ICD-10-CM | POA: Diagnosis not present

## 2018-03-06 DIAGNOSIS — Z9889 Other specified postprocedural states: Secondary | ICD-10-CM | POA: Insufficient documentation

## 2018-03-06 DIAGNOSIS — G4733 Obstructive sleep apnea (adult) (pediatric): Secondary | ICD-10-CM | POA: Diagnosis not present

## 2018-03-06 DIAGNOSIS — G8929 Other chronic pain: Secondary | ICD-10-CM | POA: Diagnosis not present

## 2018-03-06 DIAGNOSIS — Z853 Personal history of malignant neoplasm of breast: Secondary | ICD-10-CM | POA: Insufficient documentation

## 2018-03-06 DIAGNOSIS — M25512 Pain in left shoulder: Secondary | ICD-10-CM

## 2018-03-06 DIAGNOSIS — M545 Low back pain: Secondary | ICD-10-CM | POA: Insufficient documentation

## 2018-03-06 DIAGNOSIS — M1711 Unilateral primary osteoarthritis, right knee: Secondary | ICD-10-CM

## 2018-03-06 DIAGNOSIS — Z981 Arthrodesis status: Secondary | ICD-10-CM

## 2018-03-06 DIAGNOSIS — G894 Chronic pain syndrome: Secondary | ICD-10-CM

## 2018-03-06 DIAGNOSIS — M25511 Pain in right shoulder: Secondary | ICD-10-CM

## 2018-03-06 DIAGNOSIS — M48061 Spinal stenosis, lumbar region without neurogenic claudication: Secondary | ICD-10-CM | POA: Insufficient documentation

## 2018-03-06 DIAGNOSIS — G8918 Other acute postprocedural pain: Secondary | ICD-10-CM | POA: Insufficient documentation

## 2018-03-06 DIAGNOSIS — M542 Cervicalgia: Secondary | ICD-10-CM

## 2018-03-06 DIAGNOSIS — Z79891 Long term (current) use of opiate analgesic: Secondary | ICD-10-CM

## 2018-03-06 DIAGNOSIS — K219 Gastro-esophageal reflux disease without esophagitis: Secondary | ICD-10-CM | POA: Insufficient documentation

## 2018-03-06 MED ORDER — HYDROCODONE-ACETAMINOPHEN 7.5-325 MG PO TABS: 1.0000 | ORAL_TABLET | Freq: Four times a day (QID) | ORAL | 0 refills | Status: DC | PRN

## 2018-03-06 MED FILL — ANASTROZOLE 1 MG TABLET: 1 | 30 days supply | Qty: 30 | Fill #5

## 2018-03-06 NOTE — Progress Notes (Signed)
Subjective:    Patient ID: Kelsey Wilson, female    DOB: 08-12-1960, 57 y.o.   MRN: [id number]  HPI; Kelsey Wilson is a 57 year old female who returns for follow up appointment for chronic pain and medication refill. She states her pain is located in her neck, bilateral shoulders, lower back radiating into her bilateral lower extremities. Also reports her neck pain and lower back has intensified and she's only receiving 3- 4 hours of relief of pain, we will increase her dosage today and re-evaluate next month. She verbalizes understanding. She rates her pain 8. Her current exercise regime is walking. Kelsey Wilson Oral Swab   Kelsey Wilson Morphine Equivalent is 20.00 MME. Last Oral Swab was Performed on 11/21/17, it was consistent.   Pain Inventory Average Pain 8 Pain Right Now 8 My pain is tingling and aching  In the last 24 hours, has pain interfered with the following? General activity 2 Relation with others 3 Enjoyment of life 6 What TIME of day is your pain at its worst? all Sleep (in general) Fair  Pain is worse with: walking, bending, sitting, inactivity and standing Pain improves with: . Relief from Meds: 2  Mobility walk without assistance use a cane ability to climb steps?  no do you drive?  yes  Function Do you have any goals in this area?  no  Neuro/Psych weakness numbness tingling trouble walking depression anxiety  Prior Studies Any changes since last visit?  no  Physicians involved in your care Any changes since last visit?  no   Family History  Problem Relation Age of Onset  . Hypertension Mother   . Diabetes Mother   . Hypertension Father   . Diabetes Father   . Cancer Paternal Grandmother        unknown  . Colon cancer Neg Hx    Social History   Socioeconomic History  . Marital status: Single    Spouse name: Not on file  . Number of children: 3  . Years of education: Not on file  . Highest education level: Not on file    Occupational History  . Not on file  Social Needs  . Financial resource strain: Not on file  . Food insecurity:    Worry: Not on file    Inability: Not on file  . Transportation needs:    Medical: Not on file    Non-medical: Not on file  Tobacco Use  . Smoking status: Never Smoker  . Smokeless tobacco: Never Used  Substance and Sexual Activity  . Alcohol use: No    Alcohol/week: 0.0 standard drinks  . Drug use: No  . Sexual activity: Never    Birth control/protection: Post-menopausal, Surgical  Lifestyle  . Physical activity:    Days per week: Not on file    Minutes per session: Not on file  . Stress: Not on file  Relationships  . Social connections:    Talks on phone: Not on file    Gets together: Not on file    Attends religious service: Not on file    Active member of club or organization: Not on file    Attends meetings of clubs or organizations: Not on file    Relationship status: Not on file  Other Topics Concern  . Not on file  Social History Narrative  . Not on file   Past Surgical History:  Procedure Laterality Date  . ABDOMINAL HYSTERECTOMY     still has  ovaries  . APPENDECTOMY    . AXILLARY LYMPH NODE DISSECTION  11/28/2011   Procedure: AXILLARY LYMPH NODE DISSECTION;  Surgeon: Edward Jolly, MD;  Location: [place];  Service: General;  Laterality: Left;  . BREAST LUMPECTOMY Left 11/28/2011   Malignant  . BREAST SURGERY Left 2013  . CARPAL TUNNEL RELEASE     Bilateral  . CESAREAN SECTION     pt. has had 3  . CHOLECYSTECTOMY    . COLONOSCOPY    . EYE SURGERY Bilateral    cataract removal  . KNEE SURGERY     Left Knee  . LAMINECTOMY WITH POSTERIOR LATERAL ARTHRODESIS LEVEL 1 N/A 11/29/2017   Procedure: Posterior Lateral Fusion - Lumbar Four-Lumbar Five, removal and replacement of hardware, Laminectomy - Lumbar Four-Lumbar Five;  Surgeon: Eustace Moore, MD;  Location: [facility] OR;  Service: Neurosurgery;  Laterality: N/A;  . LAMINECTOMY WITH POSTERIOR  LATERAL ARTHRODESIS LEVEL 2 N/A 06/07/2017   Procedure: Posterior Lateral Fusion Lumbar Three-Four and Transforaminal Interbody Fusion Lumbar Four-Five with Segmental  Pedicle Screw Fixation;  Surgeon: Eustace Moore, MD;  Location: [place];  Service: Neurosurgery;  Laterality: N/A;  Posterior Lateral Fusion Lumbar Three-Four and Transforaminal Interbody Fusion Lumbar Four-Five with Segmental  Pedicle Screw Fixation   . LUMBAR FUSION  06/07/2017   POST  . LUMBAR LAMINECTOMY/DECOMPRESSION MICRODISCECTOMY Bilateral 01/27/2016   Procedure: Laminectomy and Foraminotomy - Lumbar four -Lumbar five - bilateral- on-lay noninstrumented fusion;  Surgeon: Eustace Moore, MD;  Location: [place] [facility];  Service: Neurosurgery;  Laterality: Bilateral;  . MULTIPLE EXTRACTIONS WITH ALVEOLOPLASTY N/A 05/11/2016   Procedure: EXTRACTION OF TEETH EIGHTEEN, TWENTY AND TWENTY- NINE;  REMOVAL OF MANDIBULAR TORUS AND EXOSTOSIS;  Surgeon: Diona Browner, DDS;  Location: [place];  Service: Oral Surgery;  Laterality: N/A;  . PORTACATH PLACEMENT  06/18/2011   Procedure: INSERTION PORT-A-CATH;  Surgeon: Edward Jolly, MD;  Location: [place];  Service: General;  Laterality: Right;  right subclavian  . removal portacath  2014  . spur     Apex spur on both big toes  . TOE SURGERY Bilateral   . TONSILLECTOMY    . TOTAL KNEE ARTHROPLASTY Left 09/13/2014  . TOTAL KNEE ARTHROPLASTY Left 09/13/2014   Procedure: LEFT TOTAL KNEE ARTHROPLASTY;  Surgeon: Rod Can, MD;  Location: [place];  Service: Orthopedics;  Laterality: Left;  . TOTAL KNEE ARTHROPLASTY Right 03/10/2015   Procedure: RIGHT TOTAL KNEE ARTHROPLASTY;  Surgeon: Rod Can, MD;  Location: [facility];  Service: Orthopedics;  Laterality: Right;   Past Medical History:  Diagnosis Date  . Anemia   . Anxiety   . Arthritis   . Breast cancer ([place]) 2010   T3N1 invasive ductal carcinoma left breast.Takes Arimidex daily  . Bursitis   . Carpal tunnel syndrome   .  Chronic back pain    stenosis  . Constipation    takes Colace daily  . Depression    takes Benzotropine daily  . Diverticulitis of colon   . Dyspnea    daily when walking for over 1 yr.  . Fibromyalgia 08/2012  . GERD (gastroesophageal reflux disease)    takes Dexilant daily  . Hemorrhoid   . History of blood transfusion    no abnormal reaction noted  . History of colon polyps    benign  . History of shingles   . Joint pain   . Joint swelling   . Night muscle spasms    takes Flexeril nightly as  needed  . Nocturia   . OSA (obstructive sleep apnea)   . OSA on CPAP   . Peripheral edema    takes Furosemide.Just started 01/18/16  . Peripheral neuropathy    takes Lyrica daily  . Personal history of chemotherapy    2013  . Personal history of radiation therapy    2013  . Pneumonia    hx of-2015  . Pseudoarthrosis of lumbar spine   . Seasonal allergies    takes Singulair nightly  . SOB (shortness of breath) on exertion    rarely with exertion  . 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 Alphonzo Grieve)   There were no vitals taken for this visit.  Opioid Risk Score:   Fall Risk Score:  `1  Depression screen PHQ 2/9  Depression screen [facility] 2/9 01/13/2018 12/13/2017 06/21/2017 03/07/2017 08/23/2015 04/26/2015 02/04/2015  Decreased Interest 3 3 1 2 2  0 0  Down, Depressed, Hopeless 3 3 1 2  0 0 0  PHQ - 2 Score 6 6 2 4 2  0 0  Altered sleeping - - - 0 1 - -  Tired, decreased energy - - - 3 3 - -  Change in appetite - - - 3 3 - -  Feeling bad or failure about yourself  - - - 2 0 - -  Trouble concentrating - - - 3 3 - -  Moving slowly or fidgety/restless - - - 3 2 - -  Suicidal thoughts - - - 0 0 - -  PHQ-9 Score - - - 18 14 - -  Difficult doing work/chores - - - Extremely dIfficult Very difficult - -  Some recent data might be hidden     Review of Systems    Constitutional: Positive for diaphoresis and unexpected weight change.  HENT: Negative.   Eyes: Negative.   Respiratory: Positive for apnea, shortness of breath and wheezing.   Cardiovascular: Negative.   Gastrointestinal: Negative.   Endocrine: Negative.   Genitourinary: Negative.   Musculoskeletal: Positive for arthralgias, back pain, gait problem, joint swelling and myalgias.  Skin: Negative.   Allergic/Immunologic: Negative.   Neurological: Positive for numbness.  Hematological: Negative.   Psychiatric/Behavioral: Positive for dysphoric mood. The patient is nervous/anxious.   All other systems reviewed and are negative.      Objective:   Physical Exam  Constitutional: She is oriented to person, place, and time. She appears well-developed and well-nourished.  HENT:  Head: Normocephalic and atraumatic.  Neck: Normal range of motion. Neck supple.  Cervical Paraspinal Tenderness: C-5-C-6  Cardiovascular: Normal rate and regular rhythm.  Pulmonary/Chest: Effort normal and breath sounds normal.  Musculoskeletal:  Normal Muscle Bulk and Muscle Testing Reveals: Upper Extremities: Full ROM and Muscle Strength 5/5 Thoracic Paraspinal Tenderness: T-1-T-3 T-7-T-9 Lumbar Hypersensitivity Lower Extremities: Decreased ROM and Muscle Strength 5/5 Arises from Table Slowly Antalgic Gait  Neurological: She is alert and oriented to person, place, and time.  Skin: Skin is warm and dry.  Psychiatric: She has a normal mood and affect. Her behavior is normal.  Nursing note and vitals reviewed.         Assessment & Plan:  1. Lumbar Postlaminectomy/ S/P Lumbar Fusion:Spinal Stenosis: S/PPosterior Lateral Fusion L-4-L-5 removal and replacement of hardware, Laminectomy L4-L5 by Dr. Ronnald Ramp on 11/29/2017.  Continue HEP as Tolerated and Continue current medication regime. Increased: RX: Hydrocodone 7.5/325 mg one tablet every 6 hours  as needed #120. 03/06/2018 2. Lumbar Radiculitis: Continue  current medication regime with Lyrica.03/06/2018 3. Fibromyalgia:Encouraged to Increase HEP as Tolerated.Continue Current Medication Regimen with Lyrica. 03/06/2018 4. Bilateral Greater Trochanter Bursitis:No complaints today.Continue to Alternate Ice and Heat Therapy.03/06/2018. 5. Muscle Spasm:Continue:Robaxin.Continue to Monitor. 03/06/2018. 6. Chronic Pain Syndrome: Continue Current medication regimen and HEP as Tolerated. Continue to Monitor. 03/06/2018. 7. Bilateral OA of Bilateral Knees: Orthopedist following.  Continue to Monitor. 03/06/2018. 8. Bilateral Hand Pain: No complaints today. Continue to Monitor/ ? OA: Continue to Monitor.03/06/2018 9. Cervicalgia: Continue to alternate Heat/Ice Therapy. Continue HEP as Tolerated.  10. Chronic Bilateral Shoulders: Continue to alternate Ice/ Heat therapy and HEP as Tolerated.  20 minutes of Face to Face Patient Care time was spent during this visit. All questions encouraged and answered,   F/U in 1 month

## 2018-03-11 ENCOUNTER — Inpatient Hospital Stay: Payer: Medicare Other | Attending: Oncology

## 2018-03-11 DIAGNOSIS — I890 Lymphedema, not elsewhere classified: Secondary | ICD-10-CM | POA: Insufficient documentation

## 2018-03-11 DIAGNOSIS — R911 Solitary pulmonary nodule: Secondary | ICD-10-CM | POA: Diagnosis not present

## 2018-03-11 DIAGNOSIS — M545 Low back pain: Secondary | ICD-10-CM | POA: Insufficient documentation

## 2018-03-11 DIAGNOSIS — C50412 Malignant neoplasm of upper-outer quadrant of left female breast: Secondary | ICD-10-CM | POA: Insufficient documentation

## 2018-03-11 DIAGNOSIS — Z170 Estrogen receptor positive status [ER+]: Secondary | ICD-10-CM

## 2018-03-11 LAB — COMPREHENSIVE METABOLIC PANEL
ALT: 16 U/L (ref 0–44)
AST: 22 U/L (ref 15–41)
Albumin: 3.7 g/dL (ref 3.5–5.0)
Alkaline Phosphatase: 100 U/L (ref 38–126)
Anion gap: 10 (ref 5–15)
BUN: 17 mg/dL (ref 6–20)
CO2: 27 mmol/L (ref 22–32)
Calcium: 9.4 mg/dL (ref 8.9–10.3)
Chloride: 105 mmol/L (ref 98–111)
Creatinine, Ser: 0.9 mg/dL (ref 0.44–1.00)
GFR calc Af Amer: >60 mL/min (ref >60)
GFR calc non Af Amer: >60 mL/min (ref >60)
Glucose, Bld: 145 mg/dL — ABNORMAL HIGH (ref 70–99)
Potassium: 3.7 mmol/L (ref 3.5–5.1)
Sodium: 142 mmol/L (ref 135–145)
Total Bilirubin: 0.7 mg/dL (ref 0.3–1.2)
Total Protein: 7.1 g/dL (ref 6.5–8.1)

## 2018-03-11 LAB — CBC WITH DIFFERENTIAL/PLATELET
Abs Immature Granulocytes: 0.01 10*3/uL (ref 0.00–0.07)
Basophils Absolute: 0 10*3/uL (ref 0.0–0.1)
Basophils Relative: 0 %
Eosinophils Absolute: 0.1 10*3/uL (ref 0.0–0.5)
Eosinophils Relative: 3 %
HCT: 35.9 % — ABNORMAL LOW (ref 36.0–46.0)
Hemoglobin: 11.6 g/dL — ABNORMAL LOW (ref 12.0–15.0)
Immature Granulocytes: 0 %
Lymphocytes Relative: 56 %
Lymphs Abs: 1.8 10*3/uL (ref 0.7–4.0)
MCH: 27 pg (ref 26.0–34.0)
MCHC: 32.3 g/dL (ref 30.0–36.0)
MCV: 83.5 fL (ref 80.0–100.0)
Monocytes Absolute: 0.3 10*3/uL (ref 0.1–1.0)
Monocytes Relative: 9 %
Neutro Abs: 1 10*3/uL — ABNORMAL LOW (ref 1.7–7.7)
Neutrophils Relative %: 32 %
Platelets: 184 10*3/uL (ref 150–400)
RBC: 4.3 MIL/uL (ref 3.87–5.11)
RDW: 13.7 % (ref 11.5–15.5)
WBC: 3.2 10*3/uL — ABNORMAL LOW (ref 4.0–10.5)
nRBC: 0 % (ref 0.0–0.2)

## 2018-03-12 LAB — CANCER ANTIGEN 27.29: CA 27.29: 15.1 U/mL (ref 0.0–38.6)

## 2018-03-12 NOTE — Progress Notes (Signed)
[place]  [phone] [phone]  OFFICE PROGRESS NOTE   ID: Kelsey Wilson   DOB: Nov 30, 1960  MR#: [id number]  RAX#:[id number]  Patient Care Team: Javier Docker, MD as PCP - General (Internal Medicine) Willia Craze, NP as Nurse Practitioner (Gastroenterology) Gardiner Barefoot, DPM as Consulting Physician (Podiatry) Melida Quitter, MD as Consulting Physician (Otolaryngology) Delice Bison, Charlestine Massed, NP as Nurse Practitioner (Hematology and Oncology) Eppie Gibson, MD as Attending Physician (Radiation Oncology) Magrinat, Virgie Dad, MD as Consulting Physician (Oncology) Excell Seltzer, MD as Consulting Physician (General Surgery) OTHER MD: Earnie Larsson,  Theodis Sato, MD, Alysia Penna mD  CHIEF COMPLAINT: left breast cancer  CURRENT THERAPY: anastrozole   BREAST CANCER HISTORY: From Dr. Julien Girt note 06/13/2011:   "The patient first noticed a lump in the upper outer left breast about mid November. She had been having regular mammograms. She consulted her physician and was referred to the breast center for further workup. Bilateral mammogram was performed which revealed an irregular mass in the upper outer quadrant of the left breast at the 2:00 position measuring approximately 3 cm. A clearly enlarged left axillary lymph node was also seen. 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 Ki-67 was 100%. Subsequent bilateral breast MRI was performed. This reveals abnormal enhancement in the area of the mass measuring 2.9 x 5.2 cm. Again noted was an approximately 3 cm axillary lymph node. No other areas were detected."   Her subsequent history is as detailed below.  INTERVAL HISTORY: Kelsey Wilson is here today for follow up of her left breast cancer.  She continues on anastrozole, with good  tolerance. She has constant hot flashes that are primarily during the day. She notes vaginal dryness as well.   Since her last visit to the office, she underwent a CT Chest w contrast on 12/16/2017, that showed: Previously noted nodules in the left lung are stable compared to the prior study. Mild cardiomegaly.  She will be due for repeat DEXA scan June 02, 2018, at the time of her next mammogram.    REVIEW OF SYSTEMS: Kelsey Wilson reports that she hasn't been exercising much, however, she uses a back stretcher daily for 30 minutes per her surgeon, Dr. Ronnald Ramp. She just had back surgery with Dr. Ronnald Ramp and she is still recuperating, however, she is not in physical therapy at this time. She reports that her back pain is the same as before the surgery. She denies issues with bleeding or fever after the surgery. Most days, she takes and picks her grandchildren up from school and watches them until their parents get home. She is still using prescription Norco, which doesn't constipate her. She denies unusual headaches, visual changes, nausea, vomiting, or dizziness. There has been no unusual cough, phlegm production, or pleurisy. This been no change in bowel or bladder habits. She denies unexplained fatigue or unexplained weight loss, bleeding, rash, or fever. A detailed review of systems was otherwise stable.    PAST MEDICAL HISTORY: Past Medical History:  Diagnosis Date  . Anemia   . Anxiety   . Arthritis   . Breast cancer ([place]) 2010   T3N1 invasive ductal carcinoma left breast.Takes Arimidex daily  . Bursitis   . Carpal tunnel syndrome   . Chronic back pain    stenosis  . Constipation    takes Colace daily  . Depression  takes Benzotropine daily  . Diverticulitis of colon   . Dyspnea    daily when walking for over 1 yr.  . Fibromyalgia 08/2012  . GERD (gastroesophageal reflux disease)    takes Dexilant daily  . Hemorrhoid   . History of blood transfusion    no abnormal reaction noted   . History of colon polyps    benign  . History of shingles   . Joint pain   . Joint swelling   . Night muscle spasms    takes Flexeril nightly as needed  . Nocturia   . OSA (obstructive sleep apnea)   . OSA on CPAP   . Peripheral edema    takes Furosemide.Just started 01/18/16  . Peripheral neuropathy    takes Lyrica daily  . Personal history of chemotherapy    2013  . Personal history of radiation therapy    2013  . Pneumonia    hx of-2015  . Pseudoarthrosis of lumbar spine   . Seasonal allergies    takes Singulair nightly  . SOB (shortness of breath) on exertion    rarely with exertion  . 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 Alphonzo Grieve)    PAST SURGICAL HISTORY: Past Surgical History:  Procedure Laterality Date  . ABDOMINAL HYSTERECTOMY     still has ovaries  . APPENDECTOMY    . AXILLARY LYMPH NODE DISSECTION  11/28/2011   Procedure: AXILLARY LYMPH NODE DISSECTION;  Surgeon: Edward Jolly, MD;  Location: [facility];  Service: General;  Laterality: Left;  . BREAST LUMPECTOMY Left 11/28/2011   Malignant  . BREAST SURGERY Left 2013  . CARPAL TUNNEL RELEASE     Bilateral  . CESAREAN SECTION     pt. has had 3  . CHOLECYSTECTOMY    . COLONOSCOPY    . EYE SURGERY Bilateral    cataract removal  . KNEE SURGERY     Left Knee  . LAMINECTOMY WITH POSTERIOR LATERAL ARTHRODESIS LEVEL 1 N/A 11/29/2017   Procedure: Posterior Lateral Fusion - Lumbar Four-Lumbar Five, removal and replacement of hardware, Laminectomy - Lumbar Four-Lumbar Five;  Surgeon: Eustace Moore, MD;  [facility];  Service: Neurosurgery;  Laterality: N/A;  . LAMINECTOMY WITH POSTERIOR LATERAL ARTHRODESIS LEVEL 2 N/A 06/07/2017   Procedure: Posterior Lateral Fusion Lumbar Three-Four and Transforaminal Interbody Fusion Lumbar Four-Five with Segmental  Pedicle Screw Fixation;  Surgeon: Eustace Moore, MD;  Location: [facility];  Service: Neurosurgery;  Laterality: N/A;  Posterior Lateral Fusion Lumbar Three-Four and Transforaminal Interbody Fusion Lumbar Four-Five with Segmental  Pedicle Screw Fixation   . LUMBAR FUSION  06/07/2017   POST  . LUMBAR LAMINECTOMY/DECOMPRESSION MICRODISCECTOMY Bilateral 01/27/2016   Procedure: Laminectomy and Foraminotomy - Lumbar four -Lumbar five - bilateral- on-lay noninstrumented fusion;  Surgeon: Eustace Moore, MD;  Location: [facility];  Service: Neurosurgery;  Laterality: Bilateral;  . MULTIPLE EXTRACTIONS WITH ALVEOLOPLASTY N/A 05/11/2016   Procedure: EXTRACTION OF TEETH EIGHTEEN, TWENTY AND TWENTY- NINE;  REMOVAL OF MANDIBULAR TORUS AND EXOSTOSIS;  Surgeon: Diona Browner, DDS;  Location: [facility];  Service: Oral Surgery;  Laterality: N/A;  . PORTACATH PLACEMENT  06/18/2011   Procedure: INSERTION PORT-A-CATH;  Surgeon: Edward Jolly, MD;  Location: [facility];  Service: General;  Laterality: Right;  right subclavian  . removal portacath  2014  . spur     Apex spur on  both big toes  . TOE SURGERY Bilateral   . TONSILLECTOMY    . TOTAL KNEE ARTHROPLASTY Left 09/13/2014  . TOTAL KNEE ARTHROPLASTY Left 09/13/2014   Procedure: LEFT TOTAL KNEE ARTHROPLASTY;  Surgeon: Rod Can, MD;  Location: [facility];  Service: Orthopedics;  Laterality: Left;  . TOTAL KNEE ARTHROPLASTY Right 03/10/2015   Procedure: RIGHT TOTAL KNEE ARTHROPLASTY;  Surgeon: Rod Can, MD;  Location: [facility];  Service: Orthopedics;  Laterality: Right;    FAMILY HISTORY Family History  Problem Relation Age of Onset  . Hypertension Mother   . Diabetes Mother   . Hypertension Father   . Diabetes Father   . Cancer Paternal Grandmother        unknown  . Colon cancer Neg Hx   Her father, 87 years old is the minister at [employer] she attends. Her mother is 7. The patient has 3 brothers and 3 sisters. There is no history of breast or ovarian cancer in the immediate  family.  GYNECOLOGIC HISTORY: Menarche age 62, first live birth age 30, she is [place] P3. She underwent hysterectomy without salpingo-oophorectomy in 2000. She never took hormone replacement.  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 She is divorced. At home she lives with herr daughter Ree Edman and her 6 children. Her son Maryela Tapper works as a Administrator. The patient has 5 additional grandchildren. She attends a [employer].    ADVANCED DIRECTIVES: Not in place  HEALTH MAINTENANCE: Social History   Tobacco Use  . Smoking status: Never Smoker  . Smokeless tobacco: Never Used  Substance Use Topics  . Alcohol use: No    Alcohol/week: 0.0 standard drinks  . Drug use: No     Colonoscopy: Not on file  PAP: Scheduled for January 2016  Bone density: Bone density December 2015 was normal.  Lipid panel: Not on file  Allergies  Allergen Reactions  . Aleve [Naproxen] Nausea Only  . Hydrocodone Itching and Other (See Comments)    High dose only  . Oxycodone Other (See Comments)    hallucinations hallucinations  . Penicillins Nausea Only and Other (See Comments)    Has patient had a PCN reaction causing immediate rash, facial/tongue/throat swelling, SOB or lightheadedness with hypotension: No Has patient had a PCN reaction causing severe rash involving mucus membranes or skin necrosis: No Has patient had a PCN reaction that required hospitalization No Has patient had a PCN reaction occurring within the last 10 years: No If all of the above answers are "NO", then may proceed with Cephalosporin use.  Marland Kitchen Prochlorperazine Other (See Comments)    Numbness of face and  lips numbness    Current Outpatient Medications  Medication Sig Dispense Refill  . anastrozole (ARIMIDEX) 1 MG tablet TAKE 1 TABLET BY MOUTH ONCE DAILY (Patient taking differently: Take 1 mg by mouth daily. ) 30 tablet 5  . benzonatate  (TESSALON) 100 MG capsule Take 100 mg by mouth 4 (four) times daily as needed for cough.     . cyclobenzaprine (FLEXERIL) 5 MG tablet Take 1 tablet (5 mg total) by mouth 2 (two) times daily as needed for muscle spasms. 60 tablet 0  . Dexlansoprazole (DEXILANT) 30 MG capsule Take 30 mg by mouth daily.    . DULoxetine (CYMBALTA) 60 MG capsule Take 60 mg by mouth daily.     Marland Kitchen HYDROcodone-acetaminophen (NORCO) 7.5-325 MG tablet Take 1 tablet by mouth every 6 (six)  hours as needed for moderate pain. 120 tablet 0  . ibuprofen (ADVIL,MOTRIN) 800 MG tablet Take 800 mg by mouth every 8 (eight) hours as needed for moderate pain.    Marland Kitchen levocetirizine (XYZAL) 5 MG tablet Take 5 mg by mouth daily.    . montelukast (SINGULAIR) 10 MG tablet Take 10 mg by mouth at bedtime.    . ondansetron (ZOFRAN) 4 MG tablet Take 4 mg by mouth every 8 (eight) hours as needed for nausea or vomiting.    . pregabalin (LYRICA) 100 MG capsule Take 1 capsule (100 mg total) by mouth 3 (three) times daily. 90 capsule 5  . promethazine (PHENERGAN) 12.5 MG suppository Place 1 suppository (12.5 mg total) rectally every 12 (twelve) hours as needed for nausea or vomiting. 20 each 0  . Valbenazine Tosylate (INGREZZA) 40 MG CAPS Take 40 mg by mouth daily.     No current facility-administered medications for this visit.     OBJECTIVE: Morbidly obese African-American woman in no acute distress  Vitals:   03/13/18 1217  BP: 136/78  Pulse: 87  Resp: 18  Temp: 98.7 F (37.1 C)  SpO2: 98%     Body mass index is 51.99 kg/m.      ECOG FS: 1 - Symptomatic but completely ambulatory   Sclerae unicteric, pupils round and equal No cervical or supraclavicular adenopathy Lungs no rales or rhonchi Heart regular rate and rhythm Abd soft, nontender, positive bowel sounds MSK no focal spinal tenderness, no upper extremity lymphedema Neuro: nonfocal, well oriented, appropriate affect Breasts: The right breast is unremarkable.  The left breast  has undergone lumpectomy followed by radiation.  There is no evidence of local recurrence.  Both axillae are benign.  LAB RESULTS: Lab Results  Component Value Date   WBC 3.2 (L) 03/11/2018   NEUTROABS 1.0 (L) 03/11/2018   HGB 11.6 (L) 03/11/2018   HCT 35.9 (L) 03/11/2018   MCV 83.5 03/11/2018   PLT 184 03/11/2018      Chemistry      Component Value Date/Time   NA 142 03/11/2018 1050   NA 144 03/07/2017 1327   K 3.7 03/11/2018 1050   K 3.3 (L) 03/07/2017 1327   CL 105 03/11/2018 1050   CL 101 09/18/2012 0853   CO2 27 03/11/2018 1050   CO2 32 (H) 03/07/2017 1327   BUN 17 03/11/2018 1050   BUN 16.0 03/07/2017 1327   CREATININE 0.90 03/11/2018 1050   CREATININE 0.96 09/05/2017 1112   CREATININE 1.0 03/07/2017 1327      Component Value Date/Time   CALCIUM 9.4 03/11/2018 1050   CALCIUM 10.0 03/07/2017 1327   ALKPHOS 100 03/11/2018 1050   ALKPHOS 92 03/07/2017 1327   AST 22 03/11/2018 1050   AST 22 09/05/2017 1112   AST 21 03/07/2017 1327   ALT 16 03/11/2018 1050   ALT 18 09/05/2017 1112   ALT 17 03/07/2017 1327   BILITOT 0.7 03/11/2018 1050   BILITOT 0.7 09/05/2017 1112   BILITOT 0.80 03/07/2017 1327      Lab Results  Component Value Date   LABCA2 41 (H) 06/13/2011    Urinalysis    Component Value Date/Time   COLORURINE YELLOW 09/02/2014 [place] 09/02/2014 1044   LABSPEC 1.012 09/02/2014 1044   PHURINE 6.0 09/02/2014 [place] 09/02/2014 1044   HGBUR NEGATIVE 09/02/2014 1044   BILIRUBINUR NEGATIVE 09/02/2014 1044   [place] 09/02/2014 [place] 09/02/2014 1044  UROBILINOGEN 0.2 09/02/2014 1044   NITRITE NEGATIVE 09/02/2014 1044   LEUKOCYTESUR NEGATIVE 09/02/2014 1044    STUDIES: On 12/16/2017, she underwent a CT Chest w contrast that showed: Previously noted nodules in the left lung are stable compared to the prior study. Mild cardiomegaly.  ASSESSMENT: 57 y.o. Kelsey Wilson, Kelsey Wilson  woman:  (1) Status post left breast biopsy 06/01/2011 for a cT2 pN1, stage IIB invasive ductal carcinoma, grade 3, estrogen receptor 80% and progesterone receptor 9% positive, with no HER-2 amplification and an MIB-1 of 100%  (2) Treated neoadjuvantly with 4 cycles of cyclophosphamide, epirubicin and fluorouracil, followed by 4 cycles of docetaxel, completed 11/01/2011  (3) Status post left lumpectomy and axillary lymph node resection 11/28/2011, showing a complete pathologic response (no residual invasive or in situ cancer in the breast and 0 of 17 lymph nodes involved)  (4) Adjuvant radiation therapy completed 03/31/2012  (5) Started anastrozole November 2013;  (a) osteopenia on bone density 05/2016 with t score of -1.3 in the right femur  (6) chronic Left upper extremity lymphedema  (7) fibromyalgia and osteoarthritis with polyarthralgia  PLAN:   Corry is now a little over 6 years out from definitive surgery for breast cancer with no evidence of disease recurrence.  This is very favorable.  She is tolerating the anastrozole well and the plan is to continue that one more year, to complete a total of 7 years.  We reviewed the results of her CT scan of the chest which is very stable.  I reassured her that what we are seeing there is almost certainly going to be scar tissue.  We will repeat the CT of the chest one more time September of next year.  Then she will see me again a year from now at which time I expect she will be ready to "graduate".  She knows to call for any other issues that may develop before the next visit.   Magrinat, Virgie Dad, MD  03/13/18 12:36 PM Medical Oncology and Hematology [facility] [address] [place], [place] [address] Tel. [phone]    Fax. [phone]    I, Soijett Blue am acting as scribe for Dr. Sarajane Jews C. Magrinat.  I, Lurline Del MD, have reviewed the above documentation for accuracy and completeness, and I agree  with the above.

## 2018-03-13 ENCOUNTER — Telehealth: Payer: Self-pay | Admitting: Oncology

## 2018-03-13 ENCOUNTER — Inpatient Hospital Stay (HOSPITAL_BASED_OUTPATIENT_CLINIC_OR_DEPARTMENT_OTHER): Payer: Medicare Other | Admitting: Oncology

## 2018-03-13 DIAGNOSIS — Z170 Estrogen receptor positive status [ER+]: Secondary | ICD-10-CM | POA: Diagnosis not present

## 2018-03-13 DIAGNOSIS — I517 Cardiomegaly: Secondary | ICD-10-CM | POA: Diagnosis not present

## 2018-03-13 DIAGNOSIS — R911 Solitary pulmonary nodule: Secondary | ICD-10-CM | POA: Diagnosis not present

## 2018-03-13 DIAGNOSIS — C50412 Malignant neoplasm of upper-outer quadrant of left female breast: Secondary | ICD-10-CM

## 2018-03-13 DIAGNOSIS — N898 Other specified noninflammatory disorders of vagina: Secondary | ICD-10-CM

## 2018-03-13 DIAGNOSIS — N951 Menopausal and female climacteric states: Secondary | ICD-10-CM

## 2018-03-13 DIAGNOSIS — M545 Low back pain: Secondary | ICD-10-CM

## 2018-03-13 DIAGNOSIS — I890 Lymphedema, not elsewhere classified: Secondary | ICD-10-CM

## 2018-03-13 MED ORDER — ANASTROZOLE 1 MG PO TABS
1.0000 mg | ORAL_TABLET | Freq: Every day | ORAL | 5 refills | Status: DC
Start: 2018-03-13 — End: 2018-04-08

## 2018-03-13 NOTE — Telephone Encounter (Signed)
Gave pt avs and calendar  °

## 2018-04-04 ENCOUNTER — Encounter: Payer: Self-pay | Admitting: Registered Nurse

## 2018-04-04 ENCOUNTER — Encounter: Payer: Medicare Other | Attending: Physical Medicine & Rehabilitation | Admitting: Registered Nurse

## 2018-04-04 ENCOUNTER — Other Ambulatory Visit: Payer: Self-pay

## 2018-04-04 VITALS — BP 121/82 | HR 90 | Ht 63.0 in | Wt 290.6 lb

## 2018-04-04 DIAGNOSIS — F329 Major depressive disorder, single episode, unspecified: Secondary | ICD-10-CM | POA: Diagnosis not present

## 2018-04-04 DIAGNOSIS — Z981 Arthrodesis status: Secondary | ICD-10-CM | POA: Diagnosis not present

## 2018-04-04 DIAGNOSIS — G8929 Other chronic pain: Secondary | ICD-10-CM | POA: Insufficient documentation

## 2018-04-04 DIAGNOSIS — Z79891 Long term (current) use of opiate analgesic: Secondary | ICD-10-CM

## 2018-04-04 DIAGNOSIS — M1711 Unilateral primary osteoarthritis, right knee: Secondary | ICD-10-CM

## 2018-04-04 DIAGNOSIS — G8918 Other acute postprocedural pain: Secondary | ICD-10-CM | POA: Diagnosis not present

## 2018-04-04 DIAGNOSIS — M5416 Radiculopathy, lumbar region: Secondary | ICD-10-CM

## 2018-04-04 DIAGNOSIS — G4733 Obstructive sleep apnea (adult) (pediatric): Secondary | ICD-10-CM | POA: Diagnosis not present

## 2018-04-04 DIAGNOSIS — Z9889 Other specified postprocedural states: Secondary | ICD-10-CM | POA: Diagnosis not present

## 2018-04-04 DIAGNOSIS — M545 Low back pain: Secondary | ICD-10-CM | POA: Insufficient documentation

## 2018-04-04 DIAGNOSIS — Z853 Personal history of malignant neoplasm of breast: Secondary | ICD-10-CM | POA: Diagnosis not present

## 2018-04-04 DIAGNOSIS — M1712 Unilateral primary osteoarthritis, left knee: Secondary | ICD-10-CM

## 2018-04-04 DIAGNOSIS — M797 Fibromyalgia: Secondary | ICD-10-CM | POA: Insufficient documentation

## 2018-04-04 DIAGNOSIS — M62838 Other muscle spasm: Secondary | ICD-10-CM

## 2018-04-04 DIAGNOSIS — M7062 Trochanteric bursitis, left hip: Secondary | ICD-10-CM

## 2018-04-04 DIAGNOSIS — G894 Chronic pain syndrome: Secondary | ICD-10-CM

## 2018-04-04 DIAGNOSIS — M48062 Spinal stenosis, lumbar region with neurogenic claudication: Secondary | ICD-10-CM

## 2018-04-04 DIAGNOSIS — K219 Gastro-esophageal reflux disease without esophagitis: Secondary | ICD-10-CM | POA: Diagnosis not present

## 2018-04-04 DIAGNOSIS — M7061 Trochanteric bursitis, right hip: Secondary | ICD-10-CM

## 2018-04-04 DIAGNOSIS — Z5181 Encounter for therapeutic drug level monitoring: Secondary | ICD-10-CM

## 2018-04-04 DIAGNOSIS — M48061 Spinal stenosis, lumbar region without neurogenic claudication: Secondary | ICD-10-CM | POA: Diagnosis not present

## 2018-04-04 MED ORDER — HYDROCODONE-ACETAMINOPHEN 7.5-325 MG PO TABS
1.0000 | ORAL_TABLET | Freq: Four times a day (QID) | ORAL | 0 refills | Status: DC | PRN
Start: 2018-04-04 — End: 2018-05-05

## 2018-04-04 MED ORDER — CYCLOBENZAPRINE HCL 5 MG PO TABS: 5.0000 mg | ORAL_TABLET | Freq: Two times a day (BID) | ORAL | 3 refills | Status: DC | PRN

## 2018-04-04 NOTE — Progress Notes (Signed)
Subjective:    Patient ID: Kelsey Wilson, female    DOB: 04-20-1961, 57 y.o.   MRN: [id number]  HPI: Kelsey Wilson is a 57 year old female who returns for follow up appointment for chronic pain and medication refill. She states her pain is located in her lower back, bilateral thigh pain she denies tingling or burning pain, she states when she experiences the thigh pain it's sharp. Also reports  her PCP placed a referral to another doctor for her thigh pain, doesn't know what speciality and bilateral hip pain and occassionally bilateral groin pain. She rates her pain 7.Her current exercise regime is walking.   Ms. [place] Morphine Equivalent is 30.00 MME. Last Oral Swab was Performed on 11/21/2017, it was consistent.   Pain Inventory Average Pain 8 Pain Right Now 7 My pain is aching  In the last 24 hours, has pain interfered with the following? General activity 7 Relation with others 7 Enjoyment of life 7 What TIME of day is your pain at its worst? morning and evening Sleep (in general) Fair  Pain is worse with: walking, bending, sitting, inactivity and standing Pain improves with: medication Relief from Meds: 3  Mobility use a cane ability to climb steps?  no do you drive?  yes  Function Do you have any goals in this area?  no  Neuro/Psych weakness numbness spasms depression anxiety  Prior Studies Any changes since last visit?  no  Physicians involved in your care Any changes since last visit?  no   Family History  Problem Relation Age of Onset  . Hypertension Mother   . Diabetes Mother   . Hypertension Father   . Diabetes Father   . Cancer Paternal Grandmother        unknown  . Colon cancer Neg Hx    Social History   Socioeconomic History  . Marital status: Single    Spouse name: Not on file  . Number of children: 3  . Years of education: Not on file  . Highest education level: Not on file  Occupational History  . Not on file  Social Needs    . Financial resource strain: Not on file  . Food insecurity:    Worry: Not on file    Inability: Not on file  . Transportation needs:    Medical: Not on file    Non-medical: Not on file  Tobacco Use  . Smoking status: Never Smoker  . Smokeless tobacco: Never Used  Substance and Sexual Activity  . Alcohol use: No    Alcohol/week: 0.0 standard drinks  . Drug use: No  . Sexual activity: Never    Birth control/protection: Post-menopausal, Surgical  Lifestyle  . Physical activity:    Days per week: Not on file    Minutes per session: Not on file  . Stress: Not on file  Relationships  . Social connections:    Talks on phone: Not on file    Gets together: Not on file    Attends religious service: Not on file    Active member of club or organization: Not on file    Attends meetings of clubs or organizations: Not on file    Relationship status: Not on file  Other Topics Concern  . Not on file  Social History Narrative  . Not on file   Past Surgical History:  Procedure Laterality Date  . ABDOMINAL HYSTERECTOMY     still has ovaries  . APPENDECTOMY    .  AXILLARY LYMPH NODE DISSECTION  11/28/2011   Procedure: AXILLARY LYMPH NODE DISSECTION;  Surgeon: Edward Jolly, MD;  Location: [place];  Service: General;  Laterality: Left;  . BREAST LUMPECTOMY Left 11/28/2011   Malignant  . BREAST SURGERY Left 2013  . CARPAL TUNNEL RELEASE     Bilateral  . CESAREAN SECTION     pt. has had 3  . CHOLECYSTECTOMY    . COLONOSCOPY    . EYE SURGERY Bilateral    cataract removal  . KNEE SURGERY     Left Knee  . LAMINECTOMY WITH POSTERIOR LATERAL ARTHRODESIS LEVEL 1 N/A 11/29/2017   Procedure: Posterior Lateral Fusion - Lumbar Four-Lumbar Five, removal and replacement of hardware, Laminectomy - Lumbar Four-Lumbar Five;  Surgeon: Eustace Moore, MD;  Location: [facility] OR;  Service: Neurosurgery;  Laterality: N/A;  . LAMINECTOMY WITH POSTERIOR LATERAL ARTHRODESIS LEVEL 2 N/A 06/07/2017   Procedure:  Posterior Lateral Fusion Lumbar Three-Four and Transforaminal Interbody Fusion Lumbar Four-Five with Segmental  Pedicle Screw Fixation;  Surgeon: Eustace Moore, MD;  Location: [place];  Service: Neurosurgery;  Laterality: N/A;  Posterior Lateral Fusion Lumbar Three-Four and Transforaminal Interbody Fusion Lumbar Four-Five with Segmental  Pedicle Screw Fixation   . LUMBAR FUSION  06/07/2017   POST  . LUMBAR LAMINECTOMY/DECOMPRESSION MICRODISCECTOMY Bilateral 01/27/2016   Procedure: Laminectomy and Foraminotomy - Lumbar four -Lumbar five - bilateral- on-lay noninstrumented fusion;  Surgeon: Eustace Moore, MD;  Location: [place] [facility];  Service: Neurosurgery;  Laterality: Bilateral;  . MULTIPLE EXTRACTIONS WITH ALVEOLOPLASTY N/A 05/11/2016   Procedure: EXTRACTION OF TEETH EIGHTEEN, TWENTY AND TWENTY- NINE;  REMOVAL OF MANDIBULAR TORUS AND EXOSTOSIS;  Surgeon: Diona Browner, DDS;  Location: [place];  Service: Oral Surgery;  Laterality: N/A;  . PORTACATH PLACEMENT  06/18/2011   Procedure: INSERTION PORT-A-CATH;  Surgeon: Edward Jolly, MD;  Location: [place];  Service: General;  Laterality: Right;  right subclavian  . removal portacath  2014  . spur     Apex spur on both big toes  . TOE SURGERY Bilateral   . TONSILLECTOMY    . TOTAL KNEE ARTHROPLASTY Left 09/13/2014  . TOTAL KNEE ARTHROPLASTY Left 09/13/2014   Procedure: LEFT TOTAL KNEE ARTHROPLASTY;  Surgeon: Rod Can, MD;  Location: [place];  Service: Orthopedics;  Laterality: Left;  . TOTAL KNEE ARTHROPLASTY Right 03/10/2015   Procedure: RIGHT TOTAL KNEE ARTHROPLASTY;  Surgeon: Rod Can, MD;  Location: [facility];  Service: Orthopedics;  Laterality: Right;   Past Medical History:  Diagnosis Date  . Anemia   . Anxiety   . Arthritis   . Breast cancer ([place]) 2010   T3N1 invasive ductal carcinoma left breast.Takes Arimidex daily  . Bursitis   . Carpal tunnel syndrome   . Chronic back pain    stenosis  . Constipation     takes Colace daily  . Depression    takes Benzotropine daily  . Diverticulitis of colon   . Dyspnea    daily when walking for over 1 yr.  . Fibromyalgia 08/2012  . GERD (gastroesophageal reflux disease)    takes Dexilant daily  . Hemorrhoid   . History of blood transfusion    no abnormal reaction noted  . History of colon polyps    benign  . History of shingles   . Joint pain   . Joint swelling   . Night muscle spasms    takes Flexeril nightly as needed  . Nocturia   . OSA (  obstructive sleep apnea)   . OSA on CPAP   . Peripheral edema    takes Furosemide.Just started 01/18/16  . Peripheral neuropathy    takes Lyrica daily  . Personal history of chemotherapy    2013  . Personal history of radiation therapy    2013  . Pneumonia    hx of-2015  . Pseudoarthrosis of lumbar spine   . Seasonal allergies    takes Singulair nightly  . SOB (shortness of breath) on exertion    rarely with exertion  . 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 Alphonzo Grieve)   BP 121/82   Pulse 90   Ht 5\' 3"  (1.6 m)   Wt 290 lb 9.6 oz (131.8 kg)   SpO2 93%   BMI 51.48 kg/m   Opioid Risk Score:   Fall Risk Score:  `1  Depression screen PHQ 2/9  Depression screen [facility] 2/9 04/04/2018 01/13/2018 12/13/2017 06/21/2017 03/07/2017 08/23/2015 04/26/2015  Decreased Interest 0 3 3 1 2 2  0  Down, Depressed, Hopeless 0 3 3 1 2  0 0  PHQ - 2 Score 0 6 6 2 4 2  0  Altered sleeping - - - - 0 1 -  Tired, decreased energy - - - - 3 3 -  Change in appetite - - - - 3 3 -  Feeling bad or failure about yourself  - - - - 2 0 -  Trouble concentrating - - - - 3 3 -  Moving slowly or fidgety/restless - - - - 3 2 -  Suicidal thoughts - - - - 0 0 -  PHQ-9 Score - - - - 18 14 -  Difficult doing work/chores - - - - Extremely dIfficult Very difficult -  Some recent data might be hidden    Review of Systems    Constitutional: Positive for diaphoresis and unexpected weight change.  HENT: Negative.   Eyes: Negative.   Respiratory: Positive for apnea, cough and shortness of breath.   Cardiovascular: Negative.   Gastrointestinal: Positive for nausea.  Endocrine: Negative.   Genitourinary: Negative.   Musculoskeletal: Negative.   Skin: Negative.   Allergic/Immunologic: Negative.   Neurological: Negative.   Hematological: Negative.   Psychiatric/Behavioral: Negative.   All other systems reviewed and are negative.      Objective:   Physical Exam  Constitutional: She is oriented to person, place, and time. She appears well-developed and well-nourished.  HENT:  Head: Normocephalic and atraumatic.  Neck: Normal range of motion. Neck supple.  Cardiovascular: Normal rate and regular rhythm.  Pulmonary/Chest: Effort normal and breath sounds normal.  Musculoskeletal: She exhibits edema.  Normal Muscle Bulk and Muscle Testing Reveals: Upper Extremities: Full ROM and Muscle Strength 5/5 Thoracic Paraspinal Tenderness: T-7-T-9 Lumbar Paraspinal Tenderness: L-4-L-5 Lower Extremities: Full ROM and Muscle Strength 5/5 Arise from Table slowly  Antalgic Gait  Neurological: She is alert and oriented to person, place, and time.  Skin: Skin is warm and dry.  Psychiatric: She has a normal mood and affect. Her behavior is normal.  Nursing note and vitals reviewed.         Assessment & Plan:  1. Lumbar Postlaminectomy/ S/P Lumbar Fusion:Spinal Stenosis: S/PPosterior Lateral Fusion L-4-L-5 removal and replacement of hardware, Laminectomy L4-L5 by Dr. Ronnald Ramp on 11/29/2017.  Continue HEP as Tolerated and Continue current medication regime. Refilled: Hydrocodone 7.5/325 mg one tablet every 6 hours as needed #  120. 04/04/2018 2. Lumbar Radiculitis: Continue current medication regime with Lyrica.04/04/2018 3. Fibromyalgia:Encouraged to Increase HEP as Tolerated.Continue Current Medication Regimen with  Lyrica. 04/04/2018 4. Bilateral Greater Trochanter Bursitis:.Continue to Alternate Ice and Heat Therapy.04/04/2018. 5. Muscle Spasm:Continue: Flexeril. .Continue to Monitor. 04/04/2018. 6. Chronic Pain Syndrome: Continue Current medication regimen and HEP as Tolerated. Continue to Monitor. 04/04/2018. 7. Bilateral OA of Bilateral Knees: Orthopedist following.Continue to Monitor. 04/04/2018. 8. Bilateral Hand Pain:No complaints today.Continue to Monitor/ ? OA: Continue to Monitor.[id number] 9. Cervicalgia: No complaints today. Continue to alternate Heat/Ice Therapy. Continue HEP as Tolerated. 04/04/2018. 10. Chronic Bilateral Shoulders: No complaints Today. Continue to alternate Ice/ Heat therapy and HEP as Tolerated. 04/04/2018.   20 minutes of Face to Face Patient Care time was spent during this visit. All questions encouraged and answered,   F/U in 1 month

## 2018-04-08 ENCOUNTER — Other Ambulatory Visit: Payer: Self-pay | Admitting: Adult Health

## 2018-04-08 DIAGNOSIS — Z170 Estrogen receptor positive status [ER+]: Secondary | ICD-10-CM

## 2018-04-08 DIAGNOSIS — C50412 Malignant neoplasm of upper-outer quadrant of left female breast: Secondary | ICD-10-CM

## 2018-04-08 MED FILL — ANASTROZOLE 1 MG TABLET: 1 | 30 days supply | Qty: 30 | Fill #0

## 2018-05-05 ENCOUNTER — Encounter: Payer: Medicare Other | Attending: Physical Medicine & Rehabilitation | Admitting: Registered Nurse

## 2018-05-05 ENCOUNTER — Encounter: Payer: Self-pay | Admitting: Registered Nurse

## 2018-05-05 VITALS — BP 116/80 | HR 89 | Ht 63.0 in | Wt 295.0 lb

## 2018-05-05 DIAGNOSIS — K219 Gastro-esophageal reflux disease without esophagitis: Secondary | ICD-10-CM | POA: Insufficient documentation

## 2018-05-05 DIAGNOSIS — Z9889 Other specified postprocedural states: Secondary | ICD-10-CM | POA: Diagnosis not present

## 2018-05-05 DIAGNOSIS — M545 Low back pain: Secondary | ICD-10-CM | POA: Diagnosis present

## 2018-05-05 DIAGNOSIS — M5416 Radiculopathy, lumbar region: Secondary | ICD-10-CM

## 2018-05-05 DIAGNOSIS — M48062 Spinal stenosis, lumbar region with neurogenic claudication: Secondary | ICD-10-CM

## 2018-05-05 DIAGNOSIS — M797 Fibromyalgia: Secondary | ICD-10-CM

## 2018-05-05 DIAGNOSIS — G8918 Other acute postprocedural pain: Secondary | ICD-10-CM | POA: Insufficient documentation

## 2018-05-05 DIAGNOSIS — M62838 Other muscle spasm: Secondary | ICD-10-CM

## 2018-05-05 DIAGNOSIS — M48061 Spinal stenosis, lumbar region without neurogenic claudication: Secondary | ICD-10-CM | POA: Diagnosis not present

## 2018-05-05 DIAGNOSIS — Z981 Arthrodesis status: Secondary | ICD-10-CM

## 2018-05-05 DIAGNOSIS — G8929 Other chronic pain: Secondary | ICD-10-CM | POA: Insufficient documentation

## 2018-05-05 DIAGNOSIS — M1711 Unilateral primary osteoarthritis, right knee: Secondary | ICD-10-CM

## 2018-05-05 DIAGNOSIS — Z5181 Encounter for therapeutic drug level monitoring: Secondary | ICD-10-CM

## 2018-05-05 DIAGNOSIS — F329 Major depressive disorder, single episode, unspecified: Secondary | ICD-10-CM | POA: Insufficient documentation

## 2018-05-05 DIAGNOSIS — M1712 Unilateral primary osteoarthritis, left knee: Secondary | ICD-10-CM

## 2018-05-05 DIAGNOSIS — G4733 Obstructive sleep apnea (adult) (pediatric): Secondary | ICD-10-CM | POA: Diagnosis not present

## 2018-05-05 DIAGNOSIS — G894 Chronic pain syndrome: Secondary | ICD-10-CM

## 2018-05-05 DIAGNOSIS — Z853 Personal history of malignant neoplasm of breast: Secondary | ICD-10-CM | POA: Diagnosis not present

## 2018-05-05 DIAGNOSIS — Z79891 Long term (current) use of opiate analgesic: Secondary | ICD-10-CM

## 2018-05-05 MED ORDER — HYDROCODONE-ACETAMINOPHEN 7.5-325 MG PO TABS: ORAL_TABLET | ORAL | 0 refills | Status: DC

## 2018-05-05 MED ORDER — HYDROCODONE-ACETAMINOPHEN 7.5-325 MG PO TABS
ORAL_TABLET | ORAL | 0 refills | Status: DC
Start: 2018-05-05 — End: 2018-05-05

## 2018-05-05 NOTE — Patient Instructions (Signed)
Call Office the week of 05/19/2018: Regarding Medication Regimen.

## 2018-05-05 NOTE — Progress Notes (Signed)
Subjective:    Patient ID: Kelsey Wilson, female    DOB: Aug 22, 1960, 57 y.o.   MRN: [id number]  HPI: Kelsey Wilson is a 57 y.o. female who returns for follow up appointment for chronic pain and medication refill. She states her pain is located in her mid- lower back radiating into her left buttock. She rates her pain 8. Her current exercise regime is walking.  Ms. [place] Morphine equivalent is 30.00 MME. Last Oral Swab was Performed on 11/25/2017, it was consistent.  Pain Inventory Average Pain 8 Pain Right Now 8 My pain is tingling and aching  In the last 24 hours, has pain interfered with the following? General activity 2 Relation with others 2 Enjoyment of life 2 What TIME of day is your pain at its worst? all Sleep (in general) Fair  Pain is worse with: walking, bending, sitting and standing Pain improves with: . Relief from Meds: 2  Mobility walk without assistance use a cane ability to climb steps?  no do you drive?  yes  Function I need assistance with the following:  household duties  Neuro/Psych weakness numbness tingling spasms depression anxiety  Prior Studies Any changes since last visit?  no  Physicians involved in your care Any changes since last visit?  no   Family History  Problem Relation Age of Onset  . Hypertension Mother   . Diabetes Mother   . Hypertension Father   . Diabetes Father   . Cancer Paternal Grandmother        unknown  . Colon cancer Neg Hx    Social History   Socioeconomic History  . Marital status: Single    Spouse name: Not on file  . Number of children: 3  . Years of education: Not on file  . Highest education level: Not on file  Occupational History  . Not on file  Social Needs  . Financial resource strain: Not on file  . Food insecurity:    Worry: Not on file    Inability: Not on file  . Transportation needs:    Medical: Not on file    Non-medical: Not on file  Tobacco Use  . Smoking status:  Never Smoker  . Smokeless tobacco: Never Used  Substance and Sexual Activity  . Alcohol use: No    Alcohol/week: 0.0 standard drinks  . Drug use: No  . Sexual activity: Never    Birth control/protection: Post-menopausal, Surgical  Lifestyle  . Physical activity:    Days per week: Not on file    Minutes per session: Not on file  . Stress: Not on file  Relationships  . Social connections:    Talks on phone: Not on file    Gets together: Not on file    Attends religious service: Not on file    Active member of club or organization: Not on file    Attends meetings of clubs or organizations: Not on file    Relationship status: Not on file  Other Topics Concern  . Not on file  Social History Narrative  . Not on file   Past Surgical History:  Procedure Laterality Date  . ABDOMINAL HYSTERECTOMY     still has ovaries  . APPENDECTOMY    . AXILLARY LYMPH NODE DISSECTION  11/28/2011   Procedure: AXILLARY LYMPH NODE DISSECTION;  Surgeon: Edward Jolly, MD;  Location: [place];  Service: General;  Laterality: Left;  . BREAST LUMPECTOMY Left 11/28/2011   Malignant  . BREAST  SURGERY Left 2013  . CARPAL TUNNEL RELEASE     Bilateral  . CESAREAN SECTION     pt. has had 3  . CHOLECYSTECTOMY    . COLONOSCOPY    . EYE SURGERY Bilateral    cataract removal  . KNEE SURGERY     Left Knee  . LAMINECTOMY WITH POSTERIOR LATERAL ARTHRODESIS LEVEL 1 N/A 11/29/2017   Procedure: Posterior Lateral Fusion - Lumbar Four-Lumbar Five, removal and replacement of hardware, Laminectomy - Lumbar Four-Lumbar Five;  Surgeon: Eustace Moore, MD;  Location: [facility] OR;  Service: Neurosurgery;  Laterality: N/A;  . LAMINECTOMY WITH POSTERIOR LATERAL ARTHRODESIS LEVEL 2 N/A 06/07/2017   Procedure: Posterior Lateral Fusion Lumbar Three-Four and Transforaminal Interbody Fusion Lumbar Four-Five with Segmental  Pedicle Screw Fixation;  Surgeon: Eustace Moore, MD;  Location: [place];  Service: Neurosurgery;  Laterality: N/A;   Posterior Lateral Fusion Lumbar Three-Four and Transforaminal Interbody Fusion Lumbar Four-Five with Segmental  Pedicle Screw Fixation   . LUMBAR FUSION  06/07/2017   POST  . LUMBAR LAMINECTOMY/DECOMPRESSION MICRODISCECTOMY Bilateral 01/27/2016   Procedure: Laminectomy and Foraminotomy - Lumbar four -Lumbar five - bilateral- on-lay noninstrumented fusion;  Surgeon: Eustace Moore, MD;  Location: [place] [facility];  Service: Neurosurgery;  Laterality: Bilateral;  . MULTIPLE EXTRACTIONS WITH ALVEOLOPLASTY N/A 05/11/2016   Procedure: EXTRACTION OF TEETH EIGHTEEN, TWENTY AND TWENTY- NINE;  REMOVAL OF MANDIBULAR TORUS AND EXOSTOSIS;  Surgeon: Diona Browner, DDS;  Location: [place];  Service: Oral Surgery;  Laterality: N/A;  . PORTACATH PLACEMENT  06/18/2011   Procedure: INSERTION PORT-A-CATH;  Surgeon: Edward Jolly, MD;  Location: [place];  Service: General;  Laterality: Right;  right subclavian  . removal portacath  2014  . spur     Apex spur on both big toes  . TOE SURGERY Bilateral   . TONSILLECTOMY    . TOTAL KNEE ARTHROPLASTY Left 09/13/2014  . TOTAL KNEE ARTHROPLASTY Left 09/13/2014   Procedure: LEFT TOTAL KNEE ARTHROPLASTY;  Surgeon: Rod Can, MD;  Location: [place];  Service: Orthopedics;  Laterality: Left;  . TOTAL KNEE ARTHROPLASTY Right 03/10/2015   Procedure: RIGHT TOTAL KNEE ARTHROPLASTY;  Surgeon: Rod Can, MD;  Location: [facility];  Service: Orthopedics;  Laterality: Right;   Past Medical History:  Diagnosis Date  . Anemia   . Anxiety   . Arthritis   . Breast cancer ([place]) 2010   T3N1 invasive ductal carcinoma left breast.Takes Arimidex daily  . Bursitis   . Carpal tunnel syndrome   . Chronic back pain    stenosis  . Constipation    takes Colace daily  . Depression    takes Benzotropine daily  . Diverticulitis of colon   . Dyspnea    daily when walking for over 1 yr.  . Fibromyalgia 08/2012  . GERD (gastroesophageal reflux disease)    takes Dexilant  daily  . Hemorrhoid   . History of blood transfusion    no abnormal reaction noted  . History of colon polyps    benign  . History of shingles   . Joint pain   . Joint swelling   . Night muscle spasms    takes Flexeril nightly as needed  . Nocturia   . OSA (obstructive sleep apnea)   . OSA on CPAP   . Peripheral edema    takes Furosemide.Just started 01/18/16  . Peripheral neuropathy    takes Lyrica daily  . Personal history of chemotherapy    2013  .  Personal history of radiation therapy    2013  . Pneumonia    hx of-2015  . Pseudoarthrosis of lumbar spine   . Seasonal allergies    takes Singulair nightly  . SOB (shortness of breath) on exertion    rarely with exertion  . 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 Alphonzo Grieve)   There were no vitals taken for this visit.  Opioid Risk Score:   Fall Risk Score:  `1  Depression screen PHQ 2/9  Depression screen [facility] 2/9 04/04/2018 01/13/2018 12/13/2017 06/21/2017 03/07/2017 08/23/2015 04/26/2015  Decreased Interest 0 3 3 1 2 2  0  Down, Depressed, Hopeless 0 3 3 1 2  0 0  PHQ - 2 Score 0 6 6 2 4 2  0  Altered sleeping - - - - 0 1 -  Tired, decreased energy - - - - 3 3 -  Change in appetite - - - - 3 3 -  Feeling bad or failure about yourself  - - - - 2 0 -  Trouble concentrating - - - - 3 3 -  Moving slowly or fidgety/restless - - - - 3 2 -  Suicidal thoughts - - - - 0 0 -  PHQ-9 Score - - - - 18 14 -  Difficult doing work/chores - - - - Extremely dIfficult Very difficult -  Some recent data might be hidden     Review of Systems  Constitutional: Negative.   HENT: Negative.   Eyes: Negative.   Respiratory: Negative.   Cardiovascular: Negative.   Gastrointestinal: Negative.   Endocrine: Negative.   Genitourinary: Negative.   Musculoskeletal: Positive for arthralgias, back pain, gait problem and myalgias.    Skin: Negative.   Allergic/Immunologic: Negative.   Neurological: Positive for weakness and numbness.  Hematological: Negative.   Psychiatric/Behavioral: Positive for dysphoric mood. The patient is nervous/anxious.   All other systems reviewed and are negative.      Objective:   Physical Exam  Constitutional: She is oriented to person, place, and time. She appears well-developed and well-nourished.  HENT:  Head: Normocephalic and atraumatic.  Neck: Normal range of motion. Neck supple.  Cervical Paraspinal Tenderness: C-5-C-6  Cardiovascular: Normal rate and regular rhythm.  Pulmonary/Chest: Effort normal and breath sounds normal.  Musculoskeletal:  Normal Muscle Bulk and Muscle Testing Reveals:  Upper Extremities: Full ROM and Muscle Strength 5/5 Lumbar Paraspinal Tenderness: L-3-L-5 Lower Extremities: Decreased ROM and Muscle Strength 5/5 Arises from Table slowly Antalgic gait  Neurological: She is alert and oriented to person, place, and time.  Skin: Skin is warm and dry.  Psychiatric: She has a normal mood and affect. Her behavior is normal.  Nursing note and vitals reviewed.         Assessment & Plan:  1. Lumbar Postlaminectomy/ S/P Lumbar Fusion:Spinal Stenosis: S/PPosterior Lateral Fusion L-4-L-5 removal and replacement of hardware, Laminectomy L4-L5 by Dr. Ronnald Ramp on 11/29/2017.  Continue HEP as Tolerated and Continue current medication regime.Refilled:Hydrocodone7.5/325 mg one tablet every 6 hours as needed#120.05/05/2018 2. Lumbar Radiculitis: Continue current medication regime with Lyrica.05/05/2018 3. Fibromyalgia:Encouraged to Increase HEP as Tolerated.Continue Current Medication Regimen with Lyrica.05/05/2018 4. Bilateral Greater Trochanter Bursitis:.No complaints today. Continue to Alternate Ice and Heat Therapy.05/05/2018. 5. Muscle Spasm:Continue: Flexeril. .Continue to Monitor.05/05/2018. 6. Chronic Pain Syndrome: Continue Current medication  regimen and HEP as Tolerated. Continue to Monitor.05/05/2018. 7. Bilateral OA of Bilateral Knees: Orthopedist following.Continue to  Monitor.05/05/2018. 8. Bilateral Hand Pain:No complaints today.Continue to Monitor/ ? OA: Continue to Monitor.05/05/2018 9. Cervicalgia: No complaints today. Continue to alternate Heat/Ice Therapy. Continue HEP as Tolerated. 05/05/2018. 10. Chronic Bilateral Shoulders: No complaints Today. Continue to alternate Ice/ Heat therapy and HEP as Tolerated. 05/05/2018.   20 minutes of Face to Face Patient Care time was spent during this visit. All questions encouraged and answered,   F/U in 1 month

## 2018-06-02 ENCOUNTER — Ambulatory Visit
Admission: RE | Admit: 2018-06-02 | Discharge: 2018-06-02 | Disposition: A | Discharge status: Home / Self Care | Payer: Medicare Other | Source: Ambulatory Visit | Attending: Oncology | Admitting: Oncology

## 2018-06-02 DIAGNOSIS — Z1231 Encounter for screening mammogram for malignant neoplasm of breast: Secondary | ICD-10-CM

## 2018-06-02 DIAGNOSIS — C50412 Malignant neoplasm of upper-outer quadrant of left female breast: Secondary | ICD-10-CM

## 2018-06-02 DIAGNOSIS — Z170 Estrogen receptor positive status [ER+]: Secondary | ICD-10-CM

## 2018-06-02 IMAGING — MG DIGITAL SCREENING BILATERAL MAMMOGRAM WITH TOMO AND CAD
6 of 12 series · 6 of 36 positions shown · non-contrast
Comparison: Previous exam(s).

CLINICAL DATA: Screening. History of treated left breast cancer,
status post lumpectomy in 0729.

EXAM:
DIGITAL SCREENING BILATERAL MAMMOGRAM WITH TOMO AND CAD

[R MLO synth-2D]
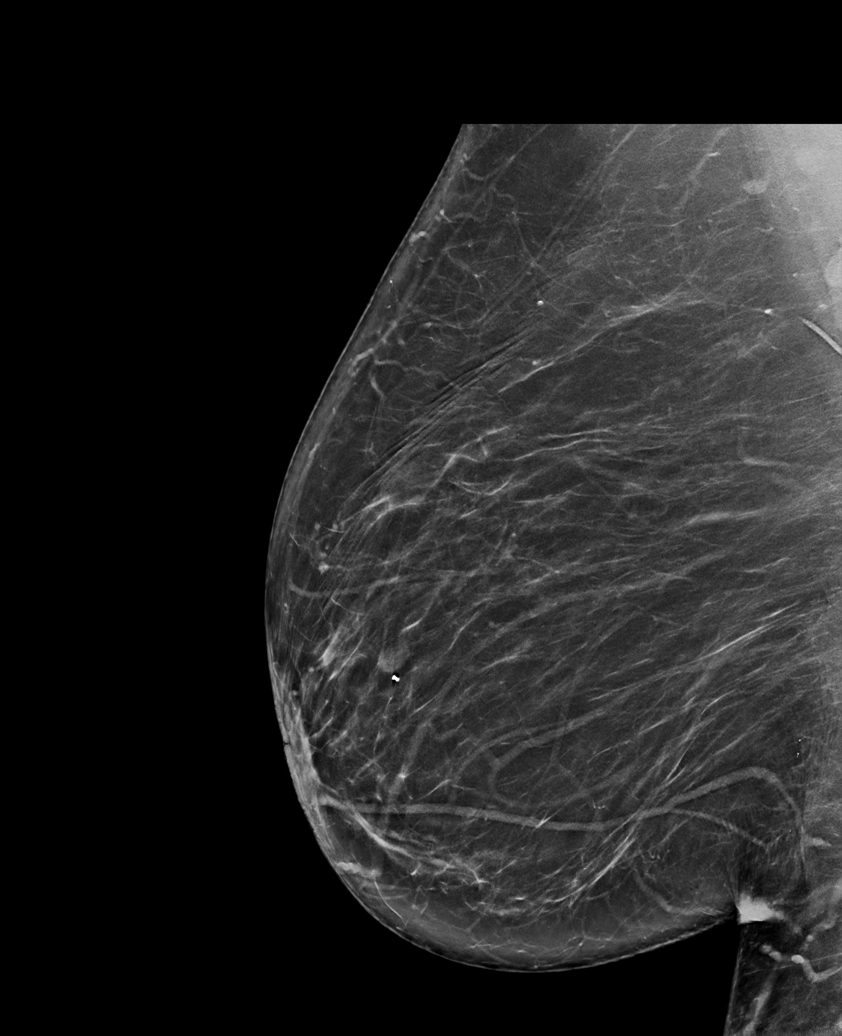

[R CC synth-2D]
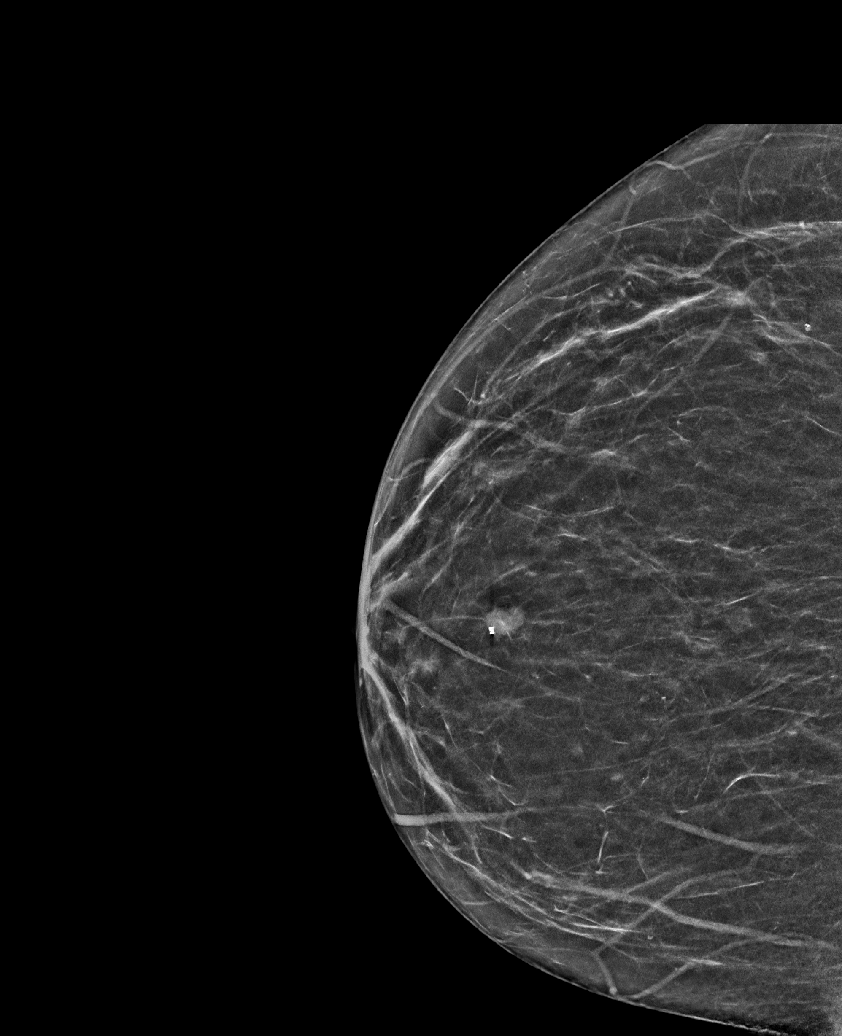

[L MLO synth-2D (1 of 3)]
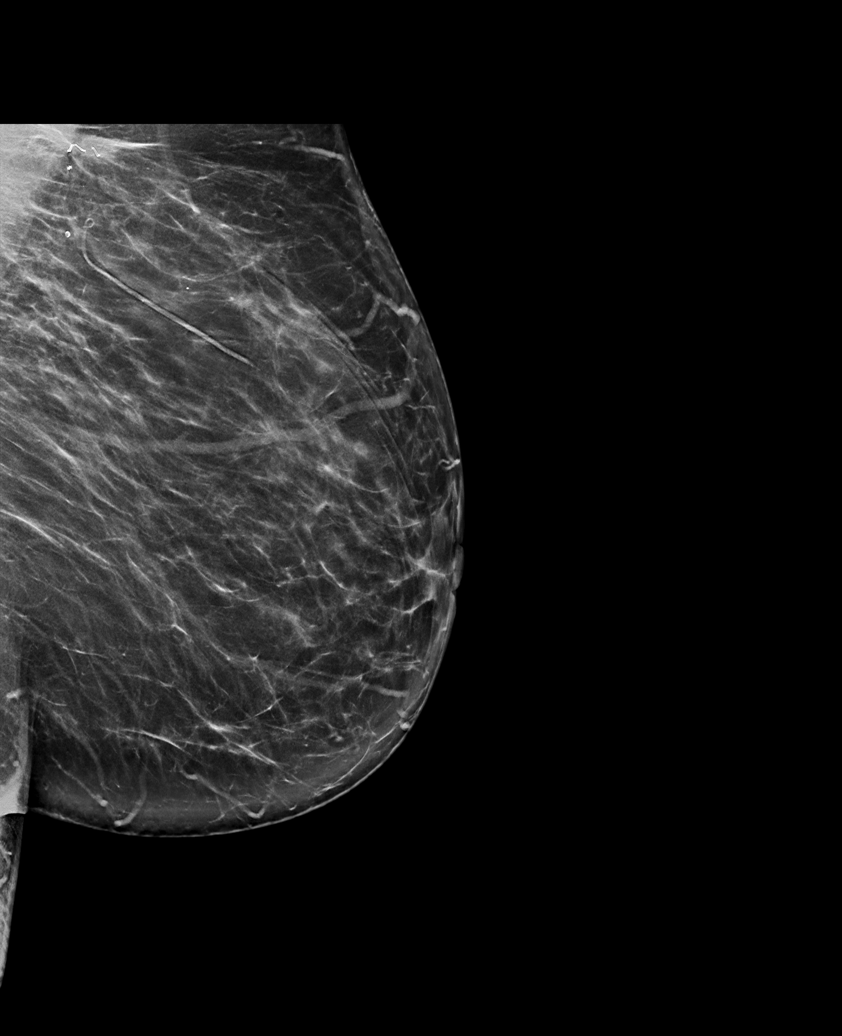

[L MLO synth-2D (2 of 3)]
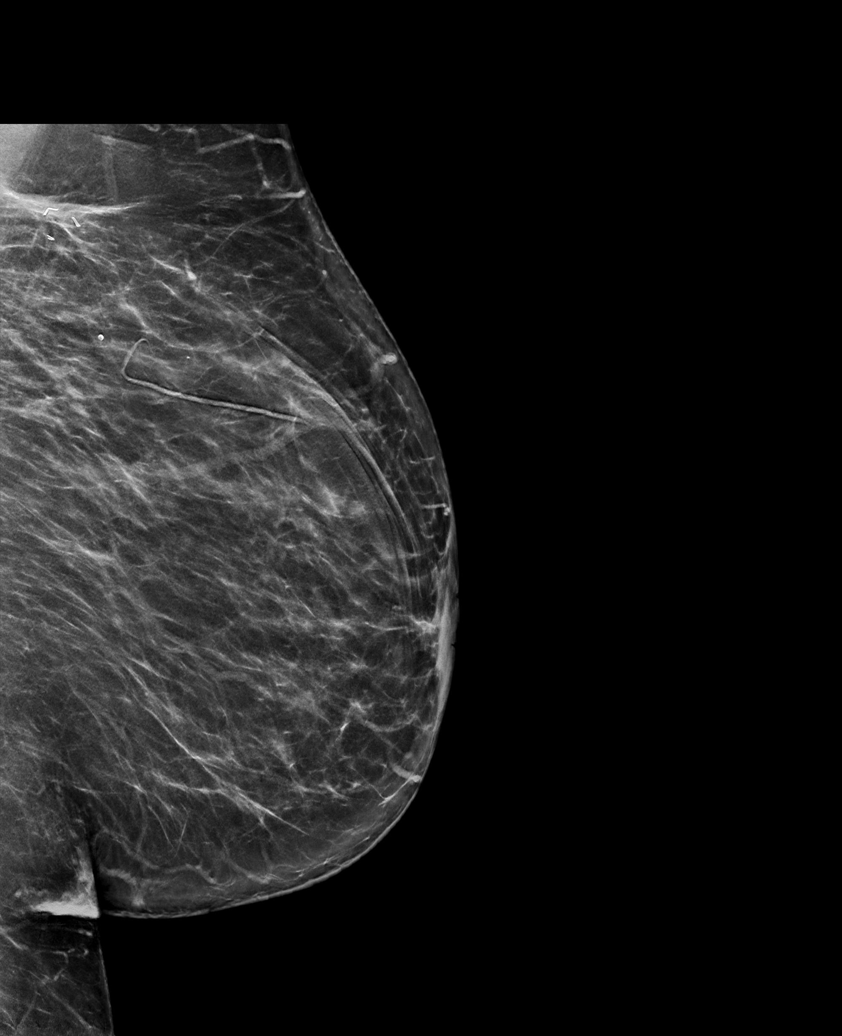

[L CC synth-2D]
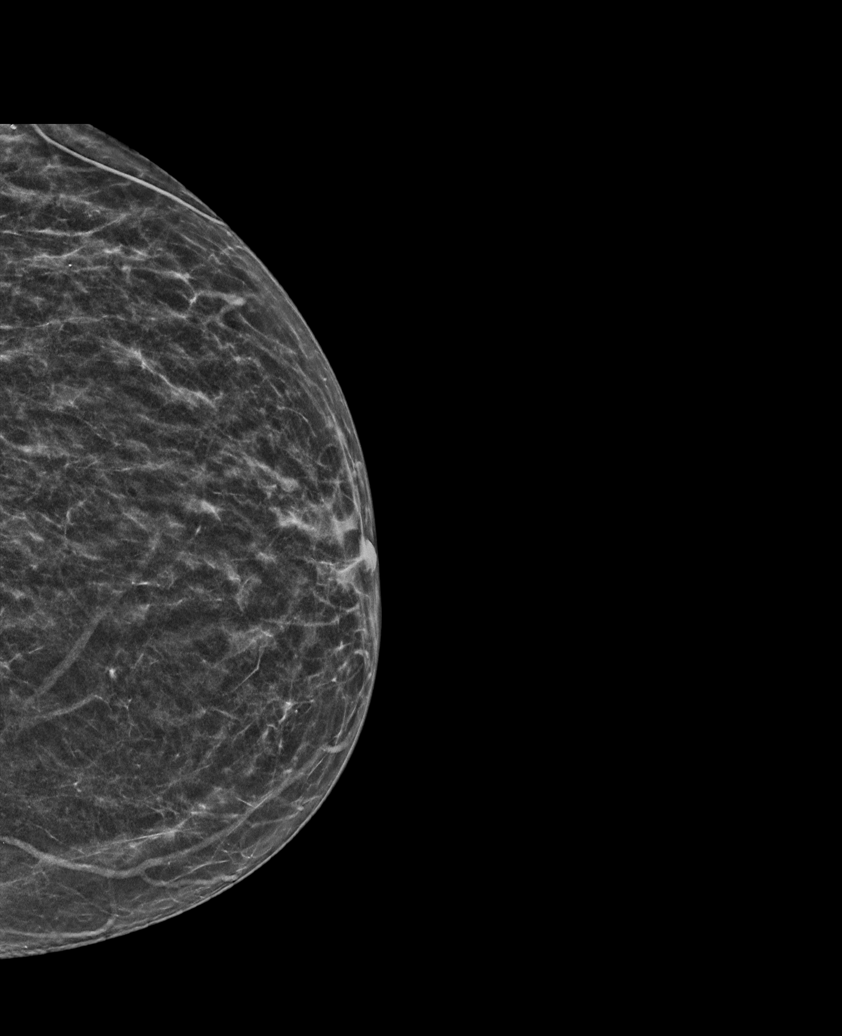

[L MLO synth-2D (3 of 3)]
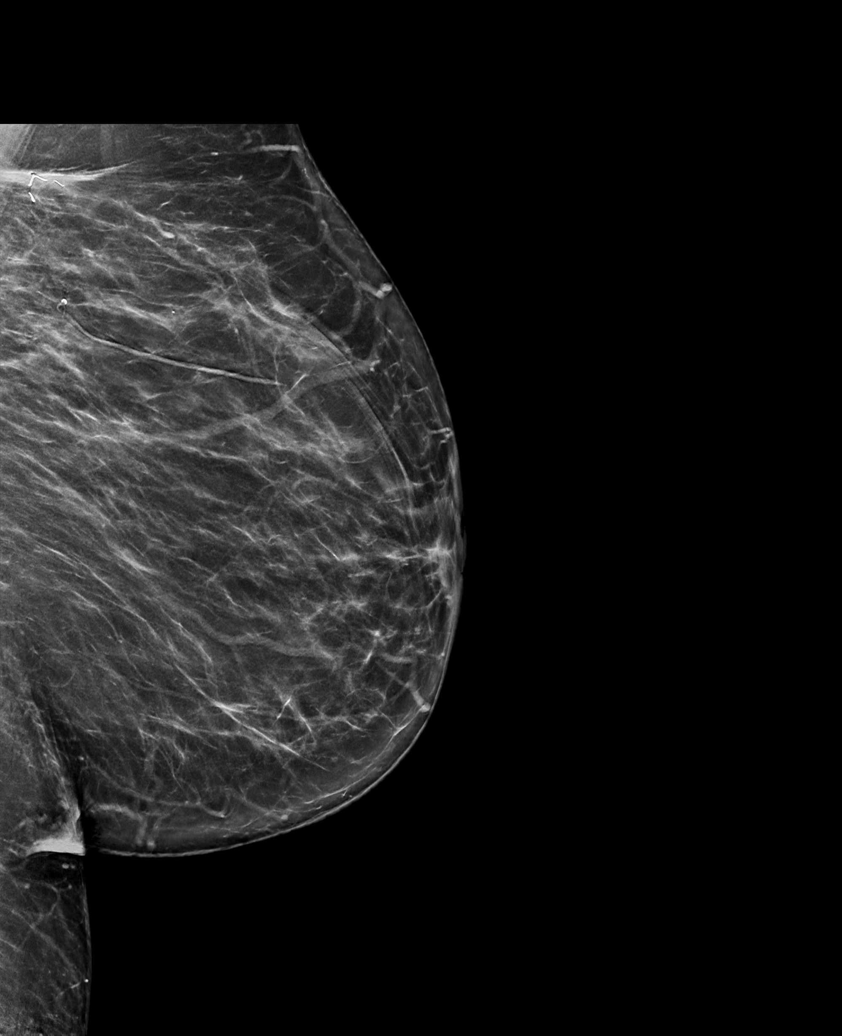

[6 of 36 positions shown; findings below may reference images not displayed]

ACR Breast Density Category b: There are scattered areas of
fibroglandular density.
FINDINGS: There are no findings suspicious for malignancy. Stable
posttreatment changes in the left breast. Images were processed with
CAD.
IMPRESSION: No mammographic evidence of malignancy. A result letter of this
screening mammogram will be mailed directly to the patient.

RECOMMENDATION:
Screening mammogram in one year. (Code:M3-Q-318)

BI-RADS CATEGORY  1: Negative.

## 2018-06-05 ENCOUNTER — Encounter: Payer: Medicare Other | Admitting: Registered Nurse

## 2018-06-06 ENCOUNTER — Encounter: Payer: Medicare Other | Attending: Physical Medicine & Rehabilitation | Admitting: Registered Nurse

## 2018-06-06 ENCOUNTER — Other Ambulatory Visit: Payer: Self-pay

## 2018-06-06 ENCOUNTER — Encounter: Payer: Self-pay | Admitting: Registered Nurse

## 2018-06-06 VITALS — BP 124/78 | HR 84 | Ht 63.0 in | Wt 295.0 lb

## 2018-06-06 DIAGNOSIS — M1712 Unilateral primary osteoarthritis, left knee: Secondary | ICD-10-CM

## 2018-06-06 DIAGNOSIS — G8918 Other acute postprocedural pain: Secondary | ICD-10-CM | POA: Diagnosis not present

## 2018-06-06 DIAGNOSIS — Z79891 Long term (current) use of opiate analgesic: Secondary | ICD-10-CM | POA: Diagnosis not present

## 2018-06-06 DIAGNOSIS — M797 Fibromyalgia: Secondary | ICD-10-CM | POA: Diagnosis not present

## 2018-06-06 DIAGNOSIS — M48062 Spinal stenosis, lumbar region with neurogenic claudication: Secondary | ICD-10-CM | POA: Diagnosis not present

## 2018-06-06 DIAGNOSIS — G894 Chronic pain syndrome: Secondary | ICD-10-CM | POA: Diagnosis not present

## 2018-06-06 DIAGNOSIS — G8929 Other chronic pain: Secondary | ICD-10-CM | POA: Diagnosis not present

## 2018-06-06 DIAGNOSIS — F329 Major depressive disorder, single episode, unspecified: Secondary | ICD-10-CM | POA: Diagnosis not present

## 2018-06-06 DIAGNOSIS — M48061 Spinal stenosis, lumbar region without neurogenic claudication: Secondary | ICD-10-CM | POA: Insufficient documentation

## 2018-06-06 DIAGNOSIS — G4733 Obstructive sleep apnea (adult) (pediatric): Secondary | ICD-10-CM | POA: Insufficient documentation

## 2018-06-06 DIAGNOSIS — Z981 Arthrodesis status: Secondary | ICD-10-CM

## 2018-06-06 DIAGNOSIS — Z9889 Other specified postprocedural states: Secondary | ICD-10-CM | POA: Insufficient documentation

## 2018-06-06 DIAGNOSIS — Z5181 Encounter for therapeutic drug level monitoring: Secondary | ICD-10-CM

## 2018-06-06 DIAGNOSIS — M1711 Unilateral primary osteoarthritis, right knee: Secondary | ICD-10-CM

## 2018-06-06 DIAGNOSIS — M62838 Other muscle spasm: Secondary | ICD-10-CM

## 2018-06-06 DIAGNOSIS — Z853 Personal history of malignant neoplasm of breast: Secondary | ICD-10-CM | POA: Insufficient documentation

## 2018-06-06 DIAGNOSIS — M545 Low back pain: Secondary | ICD-10-CM | POA: Insufficient documentation

## 2018-06-06 DIAGNOSIS — M5416 Radiculopathy, lumbar region: Secondary | ICD-10-CM

## 2018-06-06 DIAGNOSIS — K219 Gastro-esophageal reflux disease without esophagitis: Secondary | ICD-10-CM | POA: Insufficient documentation

## 2018-06-06 DIAGNOSIS — M7061 Trochanteric bursitis, right hip: Secondary | ICD-10-CM

## 2018-06-06 NOTE — Progress Notes (Signed)
Subjective:    Patient ID: Kelsey Wilson, female    DOB: 1961/05/28, 58 y.o.   MRN: [id number]  HPI: Kelsey Wilson is a 58 y.o. female who returns for follow up appointment for chronic pain and medication refill. She states her pain is located in lower back radiating into her right hip . She rates her  pain 6. Her current exercise regime is walking.  Kelsey Wilson Morphine equivalent is 30.00  MME.  Last Oral Swab was Performed on 11/21/2017, it was consistent. Oral Swab Performed today.   Pain Inventory Average Pain 8 Pain Right Now 6 My pain is constant and aching  In the last 24 hours, has pain interfered with the following? General activity 1 Relation with others 1 Enjoyment of life 1 What TIME of day is your pain at its worst? all Sleep (in general) Fair  Pain is worse with: walking and standing Pain improves with: rest, heat/ice and medication Relief from Meds: 2  Mobility use a cane ability to climb steps?  yes do you drive?  yes  Function I need assistance with the following:  meal prep, household duties and shopping  Neuro/Psych weakness numbness trouble walking spasms anxiety  Prior Studies Any changes since last visit?  no  Physicians involved in your care Any changes since last visit?  no   Family History  Problem Relation Age of Onset  . Hypertension Mother   . Diabetes Mother   . Hypertension Father   . Diabetes Father   . Cancer Paternal Grandmother        unknown  . Colon cancer Neg Hx    Social History   Socioeconomic History  . Marital status: Single    Spouse name: Not on file  . Number of children: 3  . Years of education: Not on file  . Highest education level: Not on file  Occupational History  . Not on file  Social Needs  . Financial resource strain: Not on file  . Food insecurity:    Worry: Not on file    Inability: Not on file  . Transportation needs:    Medical: Not on file    Non-medical: Not on file    Tobacco Use  . Smoking status: Never Smoker  . Smokeless tobacco: Never Used  Substance and Sexual Activity  . Alcohol use: No    Alcohol/week: 0.0 standard drinks  . Drug use: No  . Sexual activity: Never    Birth control/protection: Post-menopausal, Surgical  Lifestyle  . Physical activity:    Days per week: Not on file    Minutes per session: Not on file  . Stress: Not on file  Relationships  . Social connections:    Talks on phone: Not on file    Gets together: Not on file    Attends religious service: Not on file    Active member of club or organization: Not on file    Attends meetings of clubs or organizations: Not on file    Relationship status: Not on file  Other Topics Concern  . Not on file  Social History Narrative  . Not on file   Past Surgical History:  Procedure Laterality Date  . ABDOMINAL HYSTERECTOMY     still has ovaries  . APPENDECTOMY    . AXILLARY LYMPH NODE DISSECTION  11/28/2011   Procedure: AXILLARY LYMPH NODE DISSECTION;  Surgeon: Edward Jolly, MD;  Location: [place];  Service: General;  Laterality: Left;  .  BREAST LUMPECTOMY Left 11/28/2011   Malignant  . BREAST SURGERY Left 2013  . CARPAL TUNNEL RELEASE     Bilateral  . CESAREAN SECTION     pt. has had 3  . CHOLECYSTECTOMY    . COLONOSCOPY    . EYE SURGERY Bilateral    cataract removal  . KNEE SURGERY     Left Knee  . LAMINECTOMY WITH POSTERIOR LATERAL ARTHRODESIS LEVEL 1 N/A 11/29/2017   Procedure: Posterior Lateral Fusion - Lumbar Four-Lumbar Five, removal and replacement of hardware, Laminectomy - Lumbar Four-Lumbar Five;  Surgeon: Eustace Moore, MD;  Location: [facility];  Service: Neurosurgery;  Laterality: N/A;  . LAMINECTOMY WITH POSTERIOR LATERAL ARTHRODESIS LEVEL 2 N/A 06/07/2017   Procedure: Posterior Lateral Fusion Lumbar Three-Four and Transforaminal Interbody Fusion Lumbar Four-Five with Segmental  Pedicle Screw Fixation;  Surgeon: Eustace Moore, MD;  Location: [place];  Service:  Neurosurgery;  Laterality: N/A;  Posterior Lateral Fusion Lumbar Three-Four and Transforaminal Interbody Fusion Lumbar Four-Five with Segmental  Pedicle Screw Fixation   . LUMBAR FUSION  06/07/2017   POST  . LUMBAR LAMINECTOMY/DECOMPRESSION MICRODISCECTOMY Bilateral 01/27/2016   Procedure: Laminectomy and Foraminotomy - Lumbar four -Lumbar five - bilateral- on-lay noninstrumented fusion;  Surgeon: Eustace Moore, MD;  Location: [place] [facility];  Service: Neurosurgery;  Laterality: Bilateral;  . MULTIPLE EXTRACTIONS WITH ALVEOLOPLASTY N/A 05/11/2016   Procedure: EXTRACTION OF TEETH EIGHTEEN, TWENTY AND TWENTY- NINE;  REMOVAL OF MANDIBULAR TORUS AND EXOSTOSIS;  Surgeon: Diona Browner, DDS;  Location: [place];  Service: Oral Surgery;  Laterality: N/A;  . PORTACATH PLACEMENT  06/18/2011   Procedure: INSERTION PORT-A-CATH;  Surgeon: Edward Jolly, MD;  Location: [place];  Service: General;  Laterality: Right;  right subclavian  . removal portacath  2014  . spur     Apex spur on both big toes  . TOE SURGERY Bilateral   . TONSILLECTOMY    . TOTAL KNEE ARTHROPLASTY Left 09/13/2014  . TOTAL KNEE ARTHROPLASTY Left 09/13/2014   Procedure: LEFT TOTAL KNEE ARTHROPLASTY;  Surgeon: Rod Can, MD;  Location: [place];  Service: Orthopedics;  Laterality: Left;  . TOTAL KNEE ARTHROPLASTY Right 03/10/2015   Procedure: RIGHT TOTAL KNEE ARTHROPLASTY;  Surgeon: Rod Can, MD;  Location: [facility];  Service: Orthopedics;  Laterality: Right;   Past Medical History:  Diagnosis Date  . Anemia   . Anxiety   . Arthritis   . Breast cancer ([place]) 2010   T3N1 invasive ductal carcinoma left breast.Takes Arimidex daily  . Bursitis   . Carpal tunnel syndrome   . Chronic back pain    stenosis  . Constipation    takes Colace daily  . Depression    takes Benzotropine daily  . Diverticulitis of colon   . Dyspnea    daily when walking for over 1 yr.  . Fibromyalgia 08/2012  . GERD (gastroesophageal  reflux disease)    takes Dexilant daily  . Hemorrhoid   . History of blood transfusion    no abnormal reaction noted  . History of colon polyps    benign  . History of shingles   . Joint pain   . Joint swelling   . Night muscle spasms    takes Flexeril nightly as needed  . Nocturia   . OSA (obstructive sleep apnea)   . OSA on CPAP   . Peripheral edema    takes Furosemide.Just started 01/18/16  . Peripheral neuropathy    takes Lyrica daily  .  Personal history of chemotherapy    2013  . Personal history of radiation therapy    2013  . Pneumonia    hx of-2015  . Pseudoarthrosis of lumbar spine   . Seasonal allergies    takes Singulair nightly  . SOB (shortness of breath) on exertion    rarely with exertion  . 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 Alphonzo Grieve)   BP 124/78   Pulse 84   Ht 5\' 3"  (1.6 m)   Wt 295 lb (133.8 kg)   SpO2 93%   BMI 52.26 kg/m   Opioid Risk Score:   Fall Risk Score:  `1  Depression screen PHQ 2/9  Depression screen [facility] 2/9 06/06/2018 04/04/2018 01/13/2018 12/13/2017 06/21/2017 03/07/2017 08/23/2015  Decreased Interest 0 0 3 3 1 2 2   Down, Depressed, Hopeless 0 0 3 3 1 2  0  PHQ - 2 Score 0 0 6 6 2 4 2   Altered sleeping - - - - - 0 1  Tired, decreased energy - - - - - 3 3  Change in appetite - - - - - 3 3  Feeling bad or failure about yourself  - - - - - 2 0  Trouble concentrating - - - - - 3 3  Moving slowly or fidgety/restless - - - - - 3 2  Suicidal thoughts - - - - - 0 0  PHQ-9 Score - - - - - 18 14  Difficult doing work/chores - - - - - Extremely dIfficult Very difficult  Some recent data might be hidden    Review of Systems  Constitutional: Negative.   HENT: Negative.   Eyes: Negative.   Respiratory: Negative.   Cardiovascular: Negative.   Gastrointestinal: Negative.   Endocrine: Negative.   Genitourinary: Negative.     Musculoskeletal: Negative.   Skin: Negative.   Allergic/Immunologic: Negative.   Neurological: Negative.   Hematological: Negative.   Psychiatric/Behavioral: Negative.   All other systems reviewed and are negative.      Objective:   Physical Exam Vitals signs and nursing note reviewed.  Constitutional:      Appearance: Normal appearance. She is obese.  Neck:     Musculoskeletal: Normal range of motion and neck supple.  Cardiovascular:     Rate and Rhythm: Normal rate and regular rhythm.     Pulses: Normal pulses.     Heart sounds: Normal heart sounds.  Pulmonary:     Effort: Pulmonary effort is normal.     Breath sounds: Normal breath sounds.  Musculoskeletal:     Comments: Normal Muscle Bulk and Muscle Testing Reveals:  Upper Extremities: Full  ROM and Muscle Strength 5/5 Thoracic Paraspinal Tenderness: T-1-T-3 Mainly Right Side T-7-T-9 Mainly Left Side  Lumbar Paraspinal Tenderness: L-3-L-5 Lower Extremities: Full ROM and Muscle Strength 5/5 Arises from Table slowly Narrow Based Gait  Skin:    General: Skin is warm and dry.  Neurological:     Mental Status: She is alert and oriented to person, place, and time.  Psychiatric:        Mood and Affect: Mood normal.        Behavior: Behavior normal.           Assessment & Plan:  1. Lumbar Postlaminectomy/ S/P Lumbar Fusion:Spinal Stenosis: S/PPosterior Lateral Fusion L-4-L-5 removal and replacement of hardware, Laminectomy L4-L5 by Dr. Ronnald Ramp on 11/29/2017.  Continue HEP  as Tolerated and Continue current medication regime.Continue:Hydrocodone7.5/325 mg one tablet every 6 hours as needed#120.06/06/2018 2. Lumbar Radiculitis: Continue current medication regime with Lyrica.06/06/2018 3. Fibromyalgia:Encouraged to Increase HEP as Tolerated.Continue Current Medication Regimen with Lyrica.06/06/2018 4. Right Greater Trochanter Bursitis: Continue to Alternate Ice and Heat Therapy.06/06/2018. 5. Muscle  Spasm:Continue:Flexeril..Continue to Monitor.06/06/2018. 6. Chronic Pain Syndrome: Continue Current medication regimen and HEP as Tolerated. Continue to Monitor.06/06/2018. 7. Bilateral OA of Bilateral Knees: Orthopedist following.Continue to Monitor.06/06/2018. 8. Bilateral Hand Pain:No complaints today.Continue to Monitor/ ? OA: Continue to Monitor.06/06/2018 9. Cervicalgia:No complaints today.Continue to alternate Heat/Ice Therapy. Continue HEP as Tolerated.-06/06/2018. 10. Chronic Bilateral Shoulders:No complaints Today.Continue to alternate Ice/ Heat therapy and HEP as Tolerated.06/06/2018.  20 minutes of Face to Face Patient Care time was spent during this visit. All questions encouraged and answered,   F/U in 1 month

## 2018-06-11 ENCOUNTER — Telehealth: Payer: Self-pay | Admitting: *Deleted

## 2018-06-11 LAB — DRUG TOX MONITOR 1 W/CONF, ORAL FLD
Amphetamines: NEGATIVE ng/mL (ref <10)
Barbiturates: NEGATIVE ng/mL (ref <10)
Benzodiazepines: NEGATIVE ng/mL (ref <0.50)
Buprenorphine: NEGATIVE ng/mL (ref <0.10)
Cocaine: NEGATIVE ng/mL (ref <5.0)
Codeine: NEGATIVE ng/mL (ref <2.5)
Dihydrocodeine: 2.9 ng/mL — ABNORMAL HIGH (ref <2.5)
Fentanyl: NEGATIVE ng/mL (ref <0.10)
Heroin Metabolite: NEGATIVE ng/mL (ref <1.0)
Hydrocodone: 19.9 ng/mL — ABNORMAL HIGH (ref <2.5)
Hydromorphone: NEGATIVE ng/mL (ref <2.5)
MARIJUANA: NEGATIVE ng/mL (ref <2.5)
MDMA: NEGATIVE ng/mL (ref <10)
Meprobamate: NEGATIVE ng/mL (ref <2.5)
Methadone: NEGATIVE ng/mL (ref <5.0)
Morphine: NEGATIVE ng/mL (ref <2.5)
Nicotine Metabolite: NEGATIVE ng/mL (ref <5.0)
Norhydrocodone: NEGATIVE ng/mL (ref <2.5)
Noroxycodone: NEGATIVE ng/mL (ref <2.5)
Opiates: POSITIVE ng/mL — AB (ref <2.5)
Oxycodone: NEGATIVE ng/mL (ref <2.5)
Oxymorphone: NEGATIVE ng/mL (ref <2.5)
Phencyclidine: NEGATIVE ng/mL (ref <10)
Tapentadol: NEGATIVE ng/mL (ref <5.0)
Tramadol: NEGATIVE ng/mL (ref <5.0)
Zolpidem: NEGATIVE ng/mL (ref <5.0)

## 2018-06-11 LAB — DRUG TOX ALC METAB W/CON, ORAL FLD: Alcohol Metabolite: NEGATIVE ng/mL (ref <25)

## 2018-06-11 NOTE — Telephone Encounter (Signed)
Oral swab drug screen was consistent for prescribed medications.  ?

## 2018-06-20 ENCOUNTER — Telehealth: Payer: Self-pay | Admitting: Oncology

## 2018-06-20 NOTE — Telephone Encounter (Signed)
Mailed medical records to patient at provided address, Release ID: [id number]

## 2018-06-25 ENCOUNTER — Other Ambulatory Visit: Payer: Medicare Other

## 2018-06-25 ENCOUNTER — Ambulatory Visit: Payer: Medicare Other | Admitting: Adult Health

## 2018-07-03 ENCOUNTER — Encounter: Payer: Self-pay | Admitting: Registered Nurse

## 2018-07-03 ENCOUNTER — Encounter (HOSPITAL_BASED_OUTPATIENT_CLINIC_OR_DEPARTMENT_OTHER): Payer: Medicare Other | Admitting: Registered Nurse

## 2018-07-03 VITALS — BP 126/84 | HR 93 | Ht 63.0 in | Wt 295.8 lb

## 2018-07-03 DIAGNOSIS — Z79891 Long term (current) use of opiate analgesic: Secondary | ICD-10-CM

## 2018-07-03 DIAGNOSIS — M778 Other enthesopathies, not elsewhere classified: Secondary | ICD-10-CM

## 2018-07-03 DIAGNOSIS — M48062 Spinal stenosis, lumbar region with neurogenic claudication: Secondary | ICD-10-CM | POA: Diagnosis not present

## 2018-07-03 DIAGNOSIS — M7582 Other shoulder lesions, left shoulder: Secondary | ICD-10-CM

## 2018-07-03 DIAGNOSIS — M797 Fibromyalgia: Secondary | ICD-10-CM

## 2018-07-03 DIAGNOSIS — Z981 Arthrodesis status: Secondary | ICD-10-CM | POA: Diagnosis not present

## 2018-07-03 DIAGNOSIS — G894 Chronic pain syndrome: Secondary | ICD-10-CM

## 2018-07-03 DIAGNOSIS — G8929 Other chronic pain: Secondary | ICD-10-CM | POA: Diagnosis not present

## 2018-07-03 DIAGNOSIS — M1711 Unilateral primary osteoarthritis, right knee: Secondary | ICD-10-CM

## 2018-07-03 DIAGNOSIS — Z5181 Encounter for therapeutic drug level monitoring: Secondary | ICD-10-CM

## 2018-07-03 DIAGNOSIS — M1712 Unilateral primary osteoarthritis, left knee: Secondary | ICD-10-CM

## 2018-07-03 DIAGNOSIS — M62838 Other muscle spasm: Secondary | ICD-10-CM

## 2018-07-03 MED ORDER — HYDROCODONE-ACETAMINOPHEN 7.5-325 MG PO TABS: ORAL_TABLET | ORAL | 0 refills | Status: DC

## 2018-07-03 MED ORDER — METHYLPREDNISOLONE 4 MG PO TBPK: ORAL_TABLET | ORAL | 0 refills | Status: DC

## 2018-07-03 NOTE — Progress Notes (Signed)
Subjective:    Patient ID: Kelsey Wilson, female    DOB: 07-26-1960, 58 y.o.   MRN: [id number]  HPI: Kelsey Wilson is a 58 y.o. female who returns for follow up appointment for chronic pain and medication refill. She states her pain is located in her left shoulder, upper back mainly left side, lower back pain and bilateral knee pain. She rates her pain 7. Also reports at times she's having neuropathic pain in her bilateral hands, we will continue with current dosage of Lyrica and will consider increasing dosage at her next visit if pain persists, she verbalizes understanding. Her current exercise regime is walking, she was encouraged to increase her HEP as tolerated, she verbalizes understanding.   Kelsey Wilson Morphine equivalent is 30.00  MME. Last Oral Swab was performed on 06/06/2018, it was consistent.   Pain Inventory Average Pain 7 Pain Right Now 7 My pain is aching  In the last 24 hours, has pain interfered with the following? General activity 2 Relation with others 0 Enjoyment of life 0 What TIME of day is your pain at its worst? na Sleep (in general) Fair  Pain is worse with: walking, bending, sitting and standing Pain improves with: rest Relief from Meds: 2  Mobility walk without assistance use a cane ability to climb steps?  yes do you drive?  yes  Function Do you have any goals in this area?  no  Neuro/Psych weakness numbness trouble walking spasms anxiety  Prior Studies Any changes since last visit?  no  Physicians involved in your care Any changes since last visit?  no   Family History  Problem Relation Age of Onset  . Hypertension Mother   . Diabetes Mother   . Hypertension Father   . Diabetes Father   . Cancer Paternal Grandmother        unknown  . Colon cancer Neg Hx    Social History   Socioeconomic History  . Marital status: Single    Spouse name: Not on file  . Number of children: 3  . Years of education: Not on file    . Highest education level: Not on file  Occupational History  . Not on file  Social Needs  . Financial resource strain: Not on file  . Food insecurity:    Worry: Not on file    Inability: Not on file  . Transportation needs:    Medical: Not on file    Non-medical: Not on file  Tobacco Use  . Smoking status: Never Smoker  . Smokeless tobacco: Never Used  Substance and Sexual Activity  . Alcohol use: No    Alcohol/week: 0.0 standard drinks  . Drug use: No  . Sexual activity: Never    Birth control/protection: Post-menopausal, Surgical  Lifestyle  . Physical activity:    Days per week: Not on file    Minutes per session: Not on file  . Stress: Not on file  Relationships  . Social connections:    Talks on phone: Not on file    Gets together: Not on file    Attends religious service: Not on file    Active member of club or organization: Not on file    Attends meetings of clubs or organizations: Not on file    Relationship status: Not on file  Other Topics Concern  . Not on file  Social History Narrative  . Not on file   Past Surgical History:  Procedure Laterality Date  . ABDOMINAL  HYSTERECTOMY     still has ovaries  . APPENDECTOMY    . AXILLARY LYMPH NODE DISSECTION  11/28/2011   Procedure: AXILLARY LYMPH NODE DISSECTION;  Surgeon: Edward Jolly, MD;  Location: [place];  Service: General;  Laterality: Left;  . BREAST LUMPECTOMY Left 11/28/2011   Malignant  . BREAST SURGERY Left 2013  . CARPAL TUNNEL RELEASE     Bilateral  . CESAREAN SECTION     pt. has had 3  . CHOLECYSTECTOMY    . COLONOSCOPY    . EYE SURGERY Bilateral    cataract removal  . KNEE SURGERY     Left Knee  . LAMINECTOMY WITH POSTERIOR LATERAL ARTHRODESIS LEVEL 1 N/A 11/29/2017   Procedure: Posterior Lateral Fusion - Lumbar Four-Lumbar Five, removal and replacement of hardware, Laminectomy - Lumbar Four-Lumbar Five;  Surgeon: Eustace Moore, MD;  Location: [facility];  Service: Neurosurgery;   Laterality: N/A;  . LAMINECTOMY WITH POSTERIOR LATERAL ARTHRODESIS LEVEL 2 N/A 06/07/2017   Procedure: Posterior Lateral Fusion Lumbar Three-Four and Transforaminal Interbody Fusion Lumbar Four-Five with Segmental  Pedicle Screw Fixation;  Surgeon: Eustace Moore, MD;  Location: [place];  Service: Neurosurgery;  Laterality: N/A;  Posterior Lateral Fusion Lumbar Three-Four and Transforaminal Interbody Fusion Lumbar Four-Five with Segmental  Pedicle Screw Fixation   . LUMBAR FUSION  06/07/2017   POST  . LUMBAR LAMINECTOMY/DECOMPRESSION MICRODISCECTOMY Bilateral 01/27/2016   Procedure: Laminectomy and Foraminotomy - Lumbar four -Lumbar five - bilateral- on-lay noninstrumented fusion;  Surgeon: Eustace Moore, MD;  Location: [place] [facility];  Service: Neurosurgery;  Laterality: Bilateral;  . MULTIPLE EXTRACTIONS WITH ALVEOLOPLASTY N/A 05/11/2016   Procedure: EXTRACTION OF TEETH EIGHTEEN, TWENTY AND TWENTY- NINE;  REMOVAL OF MANDIBULAR TORUS AND EXOSTOSIS;  Surgeon: Diona Browner, DDS;  Location: [place];  Service: Oral Surgery;  Laterality: N/A;  . PORTACATH PLACEMENT  06/18/2011   Procedure: INSERTION PORT-A-CATH;  Surgeon: Edward Jolly, MD;  Location: [place];  Service: General;  Laterality: Right;  right subclavian  . removal portacath  2014  . spur     Apex spur on both big toes  . TOE SURGERY Bilateral   . TONSILLECTOMY    . TOTAL KNEE ARTHROPLASTY Left 09/13/2014  . TOTAL KNEE ARTHROPLASTY Left 09/13/2014   Procedure: LEFT TOTAL KNEE ARTHROPLASTY;  Surgeon: Rod Can, MD;  Location: [place];  Service: Orthopedics;  Laterality: Left;  . TOTAL KNEE ARTHROPLASTY Right 03/10/2015   Procedure: RIGHT TOTAL KNEE ARTHROPLASTY;  Surgeon: Rod Can, MD;  Location: [facility];  Service: Orthopedics;  Laterality: Right;   Past Medical History:  Diagnosis Date  . Anemia   . Anxiety   . Arthritis   . Breast cancer ([place]) 2010   T3N1 invasive ductal carcinoma left breast.Takes Arimidex  daily  . Bursitis   . Carpal tunnel syndrome   . Chronic back pain    stenosis  . Constipation    takes Colace daily  . Depression    takes Benzotropine daily  . Diverticulitis of colon   . Dyspnea    daily when walking for over 1 yr.  . Fibromyalgia 08/2012  . GERD (gastroesophageal reflux disease)    takes Dexilant daily  . Hemorrhoid   . History of blood transfusion    no abnormal reaction noted  . History of colon polyps    benign  . History of shingles   . Joint pain   . Joint swelling   . Night muscle spasms  takes Flexeril nightly as needed  . Nocturia   . OSA (obstructive sleep apnea)   . OSA on CPAP   . Peripheral edema    takes Furosemide.Just started 01/18/16  . Peripheral neuropathy    takes Lyrica daily  . Personal history of chemotherapy    2013  . Personal history of radiation therapy    2013  . Pneumonia    hx of-2015  . Pseudoarthrosis of lumbar spine   . Seasonal allergies    takes Singulair nightly  . SOB (shortness of breath) on exertion    rarely with exertion  . 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 Alphonzo Grieve)   BP 126/84   Pulse 93   Ht 5\' 3"  (1.6 m)   Wt 295 lb 12.8 oz (134.2 kg)   SpO2 97%   BMI 52.40 kg/m   Opioid Risk Score:   Fall Risk Score:  `1  Depression screen PHQ 2/9  Depression screen [facility] 2/9 06/06/2018 04/04/2018 01/13/2018 12/13/2017 06/21/2017 03/07/2017 08/23/2015  Decreased Interest 0 0 3 3 1 2 2   Down, Depressed, Hopeless 0 0 3 3 1 2  0  PHQ - 2 Score 0 0 6 6 2 4 2   Altered sleeping - - - - - 0 1  Tired, decreased energy - - - - - 3 3  Change in appetite - - - - - 3 3  Feeling bad or failure about yourself  - - - - - 2 0  Trouble concentrating - - - - - 3 3  Moving slowly or fidgety/restless - - - - - 3 2  Suicidal thoughts - - - - - 0 0  PHQ-9 Score - - - - - 18 14  Difficult doing work/chores - -  - - - Extremely dIfficult Very difficult  Some recent data might be hidden     Review of Systems  Constitutional: Negative.   HENT: Negative.   Eyes: Negative.   Respiratory: Negative.   Cardiovascular: Negative.   Gastrointestinal: Negative.   Endocrine: Negative.   Genitourinary: Negative.   Musculoskeletal: Positive for arthralgias, gait problem and myalgias.  Skin: Negative.   Allergic/Immunologic: Negative.   Neurological: Positive for weakness and numbness.  Hematological: Negative.   Psychiatric/Behavioral: The patient is nervous/anxious.   All other systems reviewed and are negative.      Objective:   Physical Exam Vitals signs and nursing note reviewed.  Constitutional:      Appearance: Normal appearance. She is obese.  Neck:     Musculoskeletal: Normal range of motion and neck supple.  Cardiovascular:     Rate and Rhythm: Normal rate and regular rhythm.     Pulses: Normal pulses.     Heart sounds: Normal heart sounds.  Pulmonary:     Effort: Pulmonary effort is normal.     Breath sounds: Normal breath sounds.  Musculoskeletal:     Comments: Normal Muscle Bulk and Muscle Testing Reveals:  Upper Extremities: Full ROM and Muscle Strength 5/5 Left AC Joint Tenderness  Thoracic Paraspinal Tenderness: T-1-T-3  Mainly Left Side Lumbar Paraspinal Tenderness: L-3-L-5  Lower Extremities: Full ROM and Muscle Strength 5/5 Arises from Table slowly Narrow Based Gait   Skin:    General: Skin is warm and dry.  Neurological:     Mental Status: She is alert and oriented to person, place, and time.  Psychiatric:  Mood and Affect: Mood normal.        Behavior: Behavior normal.           Assessment & Plan:  1. Lumbar Postlaminectomy/ S/P Lumbar Fusion:Spinal Stenosis: S/PPosterior Lateral Fusion L-4-L-5 removal and replacement of hardware, Laminectomy L4-L5 by Dr. Ronnald Ramp on 11/29/2017.  Continue HEP as Tolerated and Continue current medication  regime.Continue:Hydrocodone7.5/325 mg one tablet every 6 hours as needed#120.07/03/2018 2. Lumbar Radiculitis: Continue current medication regime with Lyrica.07/03/2018 3. Fibromyalgia:Encouraged to Increase HEP as Tolerated.Continue Current Medication Regimen with Lyrica.07/03/2018 4. Right Greater Trochanter Bursitis:No complaints today. Continue to Alternate Ice and Heat Therapy.07/03/2018. 5. Muscle Spasm:Continue:Flexeril..Continue to Monitor.07/03/2018. 6. Chronic Pain Syndrome: Continue Current medication regimen and HEP as Tolerated. Continue to Monitor.07/03/2018. 7. Bilateral OA of Bilateral Knees: Orthopedist following.Continue to Monitor.07/03/2018. 8. Bilateral Hand Pain:Continue to Monitor. Will consider increasing Lyrica if neuropathic pain persists. She verbalizes understanding.  9. Cervicalgia:No complaints today.Continue to alternate Heat/Ice Therapy. Continue HEP as Tolerated.-07/03/2018. 10. Chronic Bilateral Shoulders:.Continue to alternate Ice/ Heat therapy and HEP as Tolerated.07/03/2018. 11. Left Shoulder Tendonitis: RX: Medrold Dose Pak, refuses cortisone injection at this time.   20 minutes of Face to Face Patient Care time was spent during this visit. All questions encouraged and answered,   F/U in 1 month

## 2018-08-04 ENCOUNTER — Ambulatory Visit (HOSPITAL_BASED_OUTPATIENT_CLINIC_OR_DEPARTMENT_OTHER): Payer: Medicare Other | Admitting: Physical Medicine & Rehabilitation

## 2018-08-04 ENCOUNTER — Encounter: Payer: Medicare Other | Attending: Physical Medicine & Rehabilitation

## 2018-08-04 ENCOUNTER — Encounter: Payer: Self-pay | Admitting: Physical Medicine & Rehabilitation

## 2018-08-04 ENCOUNTER — Other Ambulatory Visit: Payer: Self-pay

## 2018-08-04 VITALS — BP 143/90 | HR 88 | Ht 63.0 in | Wt 302.0 lb

## 2018-08-04 DIAGNOSIS — K219 Gastro-esophageal reflux disease without esophagitis: Secondary | ICD-10-CM | POA: Insufficient documentation

## 2018-08-04 DIAGNOSIS — Z853 Personal history of malignant neoplasm of breast: Secondary | ICD-10-CM | POA: Insufficient documentation

## 2018-08-04 DIAGNOSIS — G8918 Other acute postprocedural pain: Secondary | ICD-10-CM | POA: Diagnosis not present

## 2018-08-04 DIAGNOSIS — M545 Low back pain: Secondary | ICD-10-CM | POA: Diagnosis present

## 2018-08-04 DIAGNOSIS — M797 Fibromyalgia: Secondary | ICD-10-CM | POA: Diagnosis not present

## 2018-08-04 DIAGNOSIS — M25512 Pain in left shoulder: Secondary | ICD-10-CM | POA: Diagnosis not present

## 2018-08-04 DIAGNOSIS — Z9889 Other specified postprocedural states: Secondary | ICD-10-CM | POA: Insufficient documentation

## 2018-08-04 DIAGNOSIS — F329 Major depressive disorder, single episode, unspecified: Secondary | ICD-10-CM | POA: Diagnosis not present

## 2018-08-04 DIAGNOSIS — G8929 Other chronic pain: Secondary | ICD-10-CM

## 2018-08-04 DIAGNOSIS — Z981 Arthrodesis status: Secondary | ICD-10-CM | POA: Diagnosis not present

## 2018-08-04 DIAGNOSIS — G4733 Obstructive sleep apnea (adult) (pediatric): Secondary | ICD-10-CM | POA: Diagnosis not present

## 2018-08-04 DIAGNOSIS — M48062 Spinal stenosis, lumbar region with neurogenic claudication: Secondary | ICD-10-CM | POA: Diagnosis not present

## 2018-08-04 DIAGNOSIS — M48061 Spinal stenosis, lumbar region without neurogenic claudication: Secondary | ICD-10-CM | POA: Insufficient documentation

## 2018-08-04 MED ORDER — HYDROCODONE-ACETAMINOPHEN 7.5-325 MG PO TABS: ORAL_TABLET | ORAL | 0 refills | Status: DC

## 2018-08-04 NOTE — Progress Notes (Signed)
Subjective:    Patient ID: Kelsey Wilson, female    DOB: Dec 13, 1960, 58 y.o.   MRN: [id number] Posterior Lateral Fusion L-3-4-L-5 removal and replacement of hardware, Laminectomy L4-L5 by Dr. Ronnald Ramp on 11/29/2017. HPI Left shoulder , pain times greater than 1 monthinjection was refused , no benefit from cortisone Dosepak.  She has not had any imaging studies of her shoulders.  She does have some neck pain but nothing that has been increasing.  She has no numbness or tingling in her shoulder area.  She has had no new trauma to the shoulder or neck area. Pain Inventory Average Pain 7 Pain Right Now 7 My pain is aching  In the last 24 hours, has pain interfered with the following? General activity 2 Relation with others 2 Enjoyment of life 2 What TIME of day is your pain at its worst? all Sleep (in general) Fair  Pain is worse with: walking, bending, sitting and standing Pain improves with: rest, heat/ice and medication Relief from Meds: 1  Mobility use a cane ability to climb steps?  no do you drive?  yes  Function I need assistance with the following:  meal prep, household duties and shopping  Neuro/Psych numbness spasms anxiety  Prior Studies Any changes since last visit?  no  Physicians involved in your care Any changes since last visit?  no   Family History  Problem Relation Age of Onset  . Hypertension Mother   . Diabetes Mother   . Hypertension Father   . Diabetes Father   . Cancer Paternal Grandmother        unknown  . Colon cancer Neg Hx    Social History   Socioeconomic History  . Marital status: Single    Spouse name: Not on file  . Number of children: 3  . Years of education: Not on file  . Highest education level: Not on file  Occupational History  . Not on file  Social Needs  . Financial resource strain: Not on file  . Food insecurity:    Worry: Not on file    Inability: Not on file  . Transportation needs:    Medical: Not on  file    Non-medical: Not on file  Tobacco Use  . Smoking status: Never Smoker  . Smokeless tobacco: Never Used  Substance and Sexual Activity  . Alcohol use: No    Alcohol/week: 0.0 standard drinks  . Drug use: No  . Sexual activity: Never    Birth control/protection: Post-menopausal, Surgical  Lifestyle  . Physical activity:    Days per week: Not on file    Minutes per session: Not on file  . Stress: Not on file  Relationships  . Social connections:    Talks on phone: Not on file    Gets together: Not on file    Attends religious service: Not on file    Active member of club or organization: Not on file    Attends meetings of clubs or organizations: Not on file    Relationship status: Not on file  Other Topics Concern  . Not on file  Social History Narrative  . Not on file   Past Surgical History:  Procedure Laterality Date  . ABDOMINAL HYSTERECTOMY     still has ovaries  . APPENDECTOMY    . AXILLARY LYMPH NODE DISSECTION  11/28/2011   Procedure: AXILLARY LYMPH NODE DISSECTION;  Surgeon: Edward Jolly, MD;  Location: [place];  Service: General;  Laterality:  Left;  . BREAST LUMPECTOMY Left 11/28/2011   Malignant  . BREAST SURGERY Left 2013  . CARPAL TUNNEL RELEASE     Bilateral  . CESAREAN SECTION     pt. has had 3  . CHOLECYSTECTOMY    . COLONOSCOPY    . EYE SURGERY Bilateral    cataract removal  . KNEE SURGERY     Left Knee  . LAMINECTOMY WITH POSTERIOR LATERAL ARTHRODESIS LEVEL 1 N/A 11/29/2017   Procedure: Posterior Lateral Fusion - Lumbar Four-Lumbar Five, removal and replacement of hardware, Laminectomy - Lumbar Four-Lumbar Five;  Surgeon: Eustace Moore, MD;  Location: [facility] OR;  Service: Neurosurgery;  Laterality: N/A;  . LAMINECTOMY WITH POSTERIOR LATERAL ARTHRODESIS LEVEL 2 N/A 06/07/2017   Procedure: Posterior Lateral Fusion Lumbar Three-Four and Transforaminal Interbody Fusion Lumbar Four-Five with Segmental  Pedicle Screw Fixation;  Surgeon: Eustace Moore, MD;  Location: [place];  Service: Neurosurgery;  Laterality: N/A;  Posterior Lateral Fusion Lumbar Three-Four and Transforaminal Interbody Fusion Lumbar Four-Five with Segmental  Pedicle Screw Fixation   . LUMBAR FUSION  06/07/2017   POST  . LUMBAR LAMINECTOMY/DECOMPRESSION MICRODISCECTOMY Bilateral 01/27/2016   Procedure: Laminectomy and Foraminotomy - Lumbar four -Lumbar five - bilateral- on-lay noninstrumented fusion;  Surgeon: Eustace Moore, MD;  Location: [place] [facility];  Service: Neurosurgery;  Laterality: Bilateral;  . MULTIPLE EXTRACTIONS WITH ALVEOLOPLASTY N/A 05/11/2016   Procedure: EXTRACTION OF TEETH EIGHTEEN, TWENTY AND TWENTY- NINE;  REMOVAL OF MANDIBULAR TORUS AND EXOSTOSIS;  Surgeon: Diona Browner, DDS;  Location: [place];  Service: Oral Surgery;  Laterality: N/A;  . PORTACATH PLACEMENT  06/18/2011   Procedure: INSERTION PORT-A-CATH;  Surgeon: Edward Jolly, MD;  Location: [place];  Service: General;  Laterality: Right;  right subclavian  . removal portacath  2014  . spur     Apex spur on both big toes  . TOE SURGERY Bilateral   . TONSILLECTOMY    . TOTAL KNEE ARTHROPLASTY Left 09/13/2014  . TOTAL KNEE ARTHROPLASTY Left 09/13/2014   Procedure: LEFT TOTAL KNEE ARTHROPLASTY;  Surgeon: Rod Can, MD;  Location: [place];  Service: Orthopedics;  Laterality: Left;  . TOTAL KNEE ARTHROPLASTY Right 03/10/2015   Procedure: RIGHT TOTAL KNEE ARTHROPLASTY;  Surgeon: Rod Can, MD;  Location: [facility];  Service: Orthopedics;  Laterality: Right;   Past Medical History:  Diagnosis Date  . Anemia   . Anxiety   . Arthritis   . Breast cancer ([place]) 2010   T3N1 invasive ductal carcinoma left breast.Takes Arimidex daily  . Bursitis   . Carpal tunnel syndrome   . Chronic back pain    stenosis  . Constipation    takes Colace daily  . Depression    takes Benzotropine daily  . Diverticulitis of colon   . Dyspnea    daily when walking for over 1 yr.  . Fibromyalgia  08/2012  . GERD (gastroesophageal reflux disease)    takes Dexilant daily  . Hemorrhoid   . History of blood transfusion    no abnormal reaction noted  . History of colon polyps    benign  . History of shingles   . Joint pain   . Joint swelling   . Night muscle spasms    takes Flexeril nightly as needed  . Nocturia   . OSA (obstructive sleep apnea)   . OSA on CPAP   . Peripheral edema    takes Furosemide.Just started 01/18/16  . Peripheral neuropathy  takes Lyrica daily  . Personal history of chemotherapy    2013  . Personal history of radiation therapy    2013  . Pneumonia    hx of-2015  . Pseudoarthrosis of lumbar spine   . Seasonal allergies    takes Singulair nightly  . SOB (shortness of breath) on exertion    rarely with exertion  . 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 Alphonzo Grieve)   BP (!) 143/90   Pulse 88   Ht 5\' 3"  (1.6 m)   Wt (!) 302 lb (137 kg)   SpO2 93%   BMI 53.50 kg/m   Opioid Risk Score:   Fall Risk Score:  `1  Depression screen PHQ 2/9  Depression screen [facility] 2/9 08/04/2018 06/06/2018 04/04/2018 01/13/2018 12/13/2017 06/21/2017 03/07/2017  Decreased Interest 0 0 0 3 3 1 2   Down, Depressed, Hopeless 0 0 0 3 3 1 2   PHQ - 2 Score 0 0 0 6 6 2 4   Altered sleeping - - - - - - 0  Tired, decreased energy - - - - - - 3  Change in appetite - - - - - - 3  Feeling bad or failure about yourself  - - - - - - 2  Trouble concentrating - - - - - - 3  Moving slowly or fidgety/restless - - - - - - 3  Suicidal thoughts - - - - - - 0  PHQ-9 Score - - - - - - 18  Difficult doing work/chores - - - - - - Extremely dIfficult  Some recent data might be hidden    Review of Systems  Constitutional: Positive for unexpected weight change.  HENT: Negative.   Eyes: Negative.   Respiratory: Negative.   Cardiovascular: Negative.   Gastrointestinal: Negative.    Endocrine: Negative.   Genitourinary: Negative.   Musculoskeletal: Negative.        Spasms  Skin: Negative.   Allergic/Immunologic: Negative.   Neurological: Positive for numbness.  Hematological: Negative.   Psychiatric/Behavioral: The patient is nervous/anxious.   All other systems reviewed and are negative.      Objective:   Physical Exam Vitals signs and nursing note reviewed.  Constitutional:      Appearance: She is obese.  HENT:     Head: Normocephalic and atraumatic.     Mouth/Throat:     Mouth: Mucous membranes are moist.     Pharynx: Oropharynx is clear.  Eyes:     Extraocular Movements: Extraocular movements intact.     Conjunctiva/sclera: Conjunctivae normal.     Pupils: Pupils are equal, round, and reactive to light.  Neurological:     General: No focal deficit present.     Mental Status: She is oriented to person, place, and time.  Psychiatric:        Mood and Affect: Mood normal.        Behavior: Behavior normal.    5/5 strength in bilateral deltoid bicep tricep grip Cervical range of motion mildly reduced used approximately 75% flexion extension lateral bending and rotation. Negative drop arm test Mildly positive impingement sign left greater than the right side.  There is some tenderness over the acromioclavicular joint on the left side FROM Bilateral shoulders      Assessment & Plan:  1.  Left shoulder pain: No clear-cut signs of impingement syndrome.  She has tenderness to palpation around  the shoulder but this is mainly around the acromioclavicular joint.  She has had not had any imaging studies.  She has no signs of radiculopathy.  I will order some shoulder films.  She may need ultrasound-guided shoulder  injection, AC Jt  2.  Lumbar postlaminectomy syndrome status post lumbar fusion.  Continue hydrocodone 7.5 4 times daily.  PMP reviewed, urine tox screen performed in January 2020 and was consistent Follow-up in 1 month possible injection of  left shoulder

## 2018-08-05 ENCOUNTER — Other Ambulatory Visit: Payer: Self-pay | Admitting: Neurological Surgery

## 2018-08-05 DIAGNOSIS — M4316 Spondylolisthesis, lumbar region: Secondary | ICD-10-CM

## 2018-08-06 ENCOUNTER — Telehealth: Payer: Self-pay

## 2018-08-06 NOTE — Telephone Encounter (Signed)
Spoke with patient to screen her medications and allergies prior to her being scheduled for a myelogram.  She was informed she will need a driver, will be here 2-2.5 hours, will need to be on bedrest for 24 hours after the procedure and that she will need to hold Cymbalta/Duloxetine for 48 hours before and 24 hours after the procedure.  Gypsy Lore, RN

## 2018-08-14 ENCOUNTER — Ambulatory Visit
Admission: RE | Admit: 2018-08-14 | Discharge: 2018-08-14 | Disposition: A | Discharge status: Home / Self Care | Payer: Medicare Other | Source: Ambulatory Visit | Attending: Neurological Surgery | Admitting: Neurological Surgery

## 2018-08-14 ENCOUNTER — Other Ambulatory Visit: Payer: Self-pay

## 2018-08-14 ENCOUNTER — Ambulatory Visit
Admission: RE | Admit: 2018-08-14 | Discharge: 2018-08-14 | Disposition: A | Discharge status: Home / Self Care | Payer: Medicare Other | Source: Ambulatory Visit | Attending: Physical Medicine & Rehabilitation | Admitting: Physical Medicine & Rehabilitation

## 2018-08-14 DIAGNOSIS — M25512 Pain in left shoulder: Principal | ICD-10-CM

## 2018-08-14 DIAGNOSIS — M4316 Spondylolisthesis, lumbar region: Secondary | ICD-10-CM

## 2018-08-14 DIAGNOSIS — G8929 Other chronic pain: Secondary | ICD-10-CM

## 2018-08-14 IMAGING — XA DG MYELOGRAPHY LUMBAR INJ LUMBOSACRAL
13 of 15 series · 13 of 15 positions shown · non-contrast
Comparison: Lumbar spine x-rays dated August 05, 2018. CT lumbar
myelogram dated July 02, 2017.

CLINICAL DATA: Lumbosacral spondylosis without myelopathy with
radiculopathy. Chronic low back pain radiating into both legs.
TECHNIQUE: Contiguous axial images were obtained through the lumbar spine after
the intrathecal infusion of contrast. Coronal and sagittal
reconstructions were obtained of the axial image sets.

[Series 1: vasc adipose · 1 of 1 slices shown (1 of 13)]
[im 1/1]
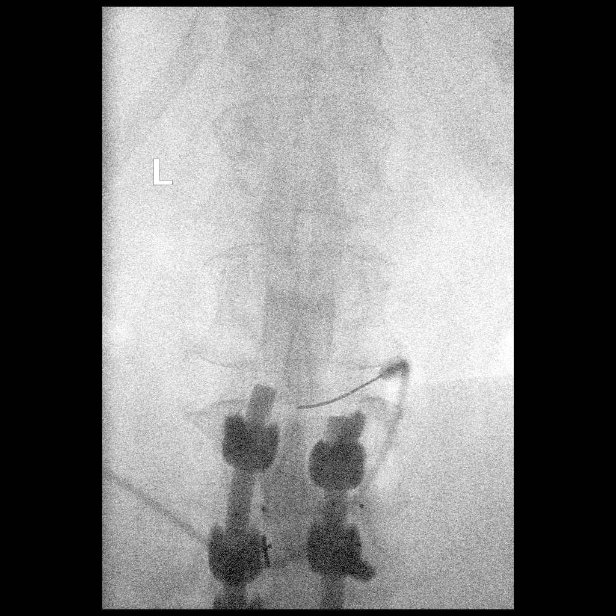

[Series 2: vasc adipose · 1 of 1 slices shown (2 of 13)]
[im 1/1]
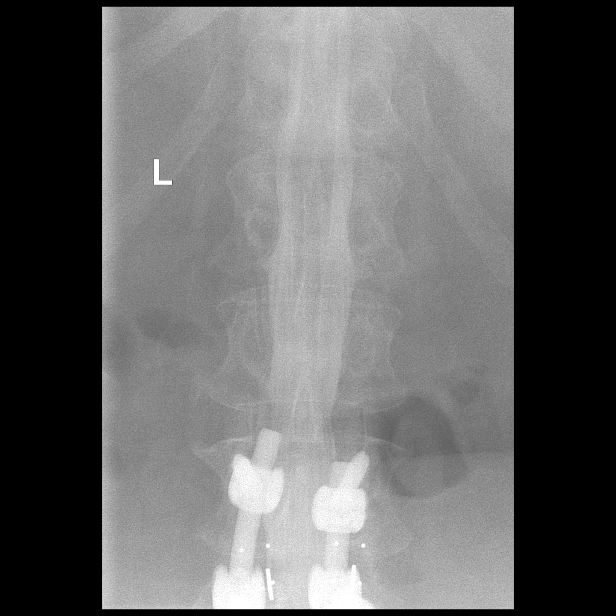

[Series 3: vasc adipose · 1 of 1 slices shown (3 of 13)]
[im 1/1]
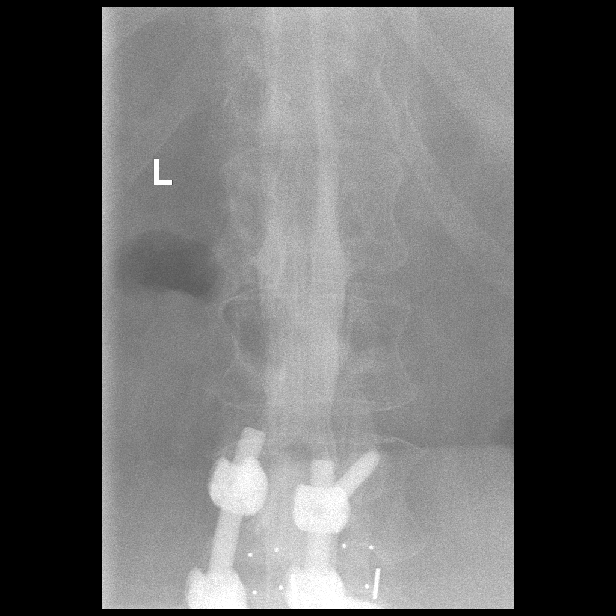

[Series 5: vasc adipose · 1 of 1 slices shown (4 of 13)]
[im 1/1]
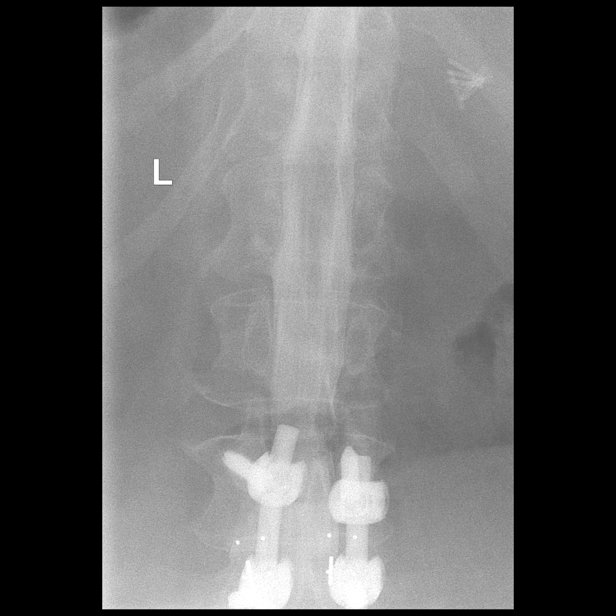

[Series 6: vasc adipose · 1 of 1 slices shown (5 of 13)]
[im 1/1]
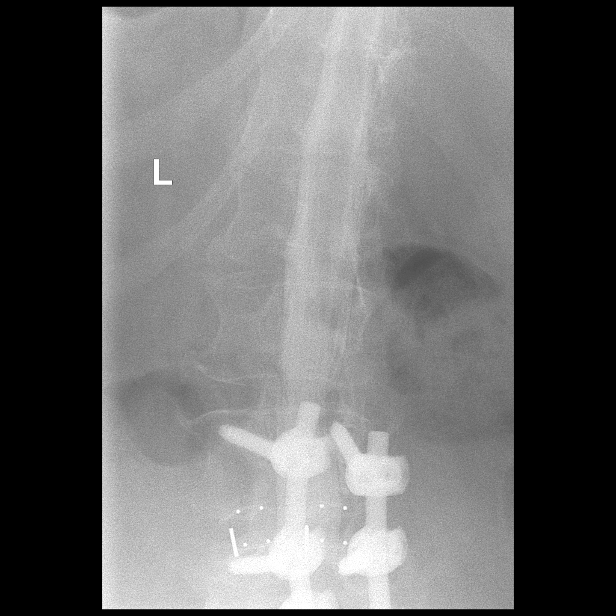

[Series 7: vasc adipose · 1 of 1 slices shown (6 of 13)]
[im 1/1]
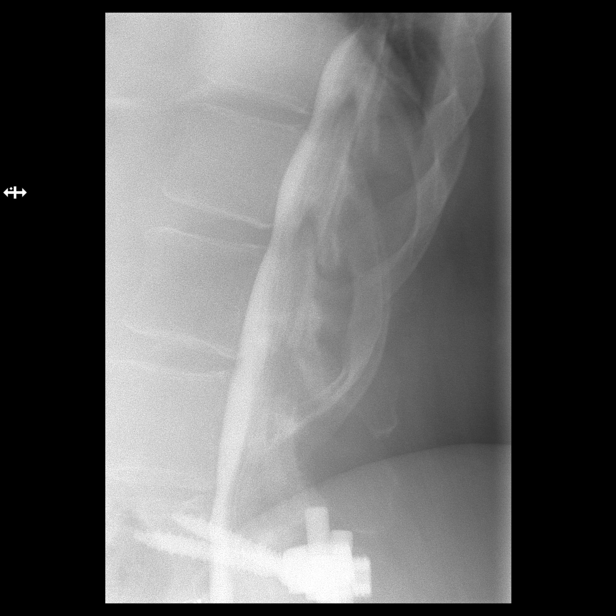

[Series 8: vasc adipose · 1 of 1 slices shown (7 of 13)]
[im 1/1]
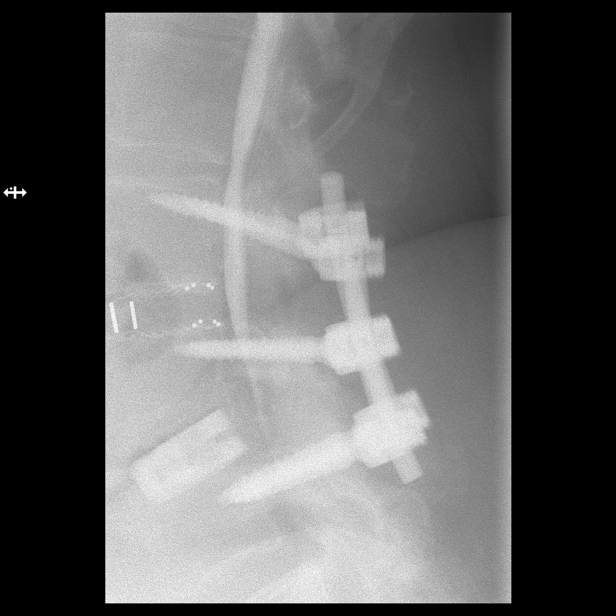

[Series 9: vasc adipose · 1 of 1 slices shown (8 of 13)]
[im 1/1]
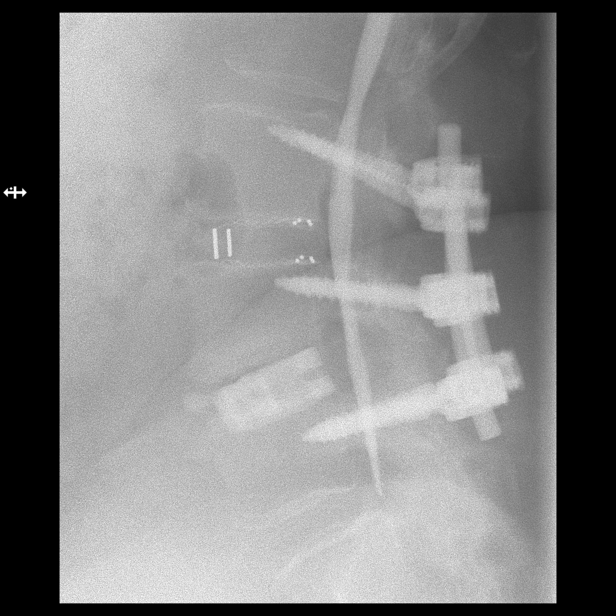

[Series 10: vasc adipose · 1 of 1 slices shown (9 of 13)]
[im 1/1]
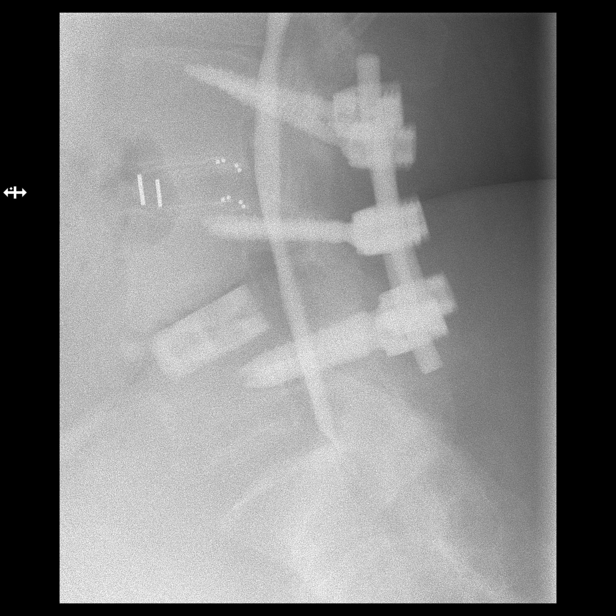

[Series 11: vasc adipose · 1 of 1 slices shown (10 of 13)]
[im 1/1]
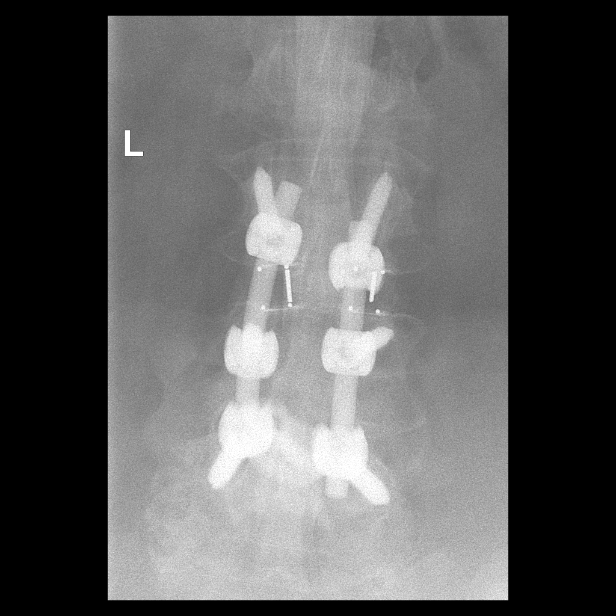

[Series 13: vasc adipose · 1 of 1 slices shown (11 of 13)]
[im 1/1]
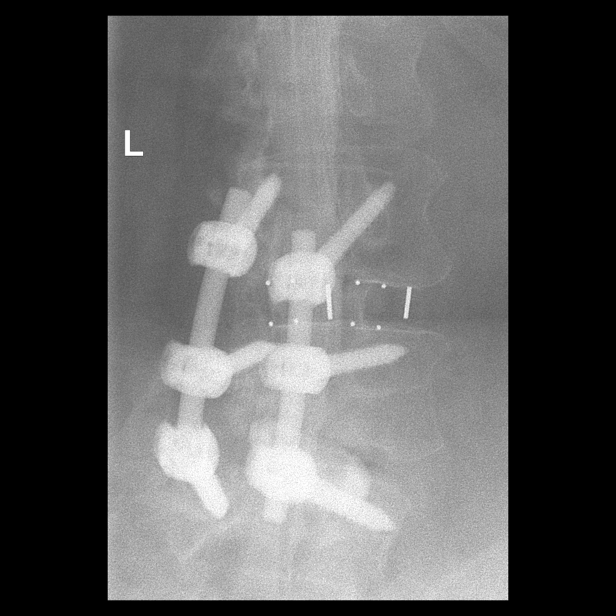

[Series 14: vasc adipose · 1 of 1 slices shown (12 of 13)]
[im 1/1]
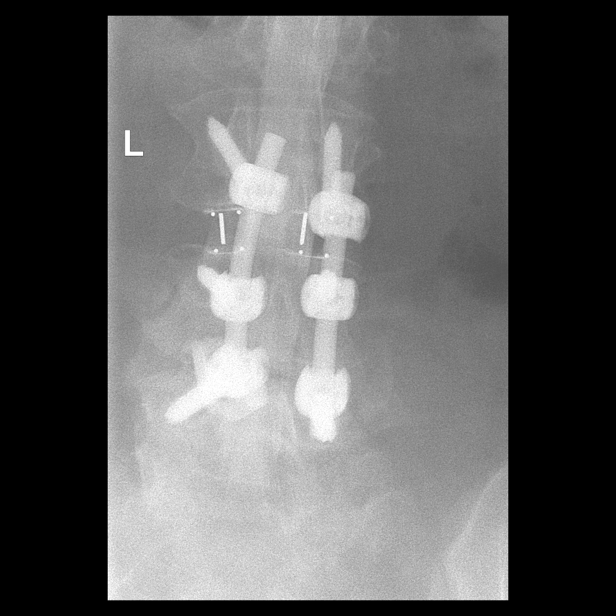

[Series 15: vasc adipose · 1 of 1 slices shown (13 of 13)]
[im 1/1]
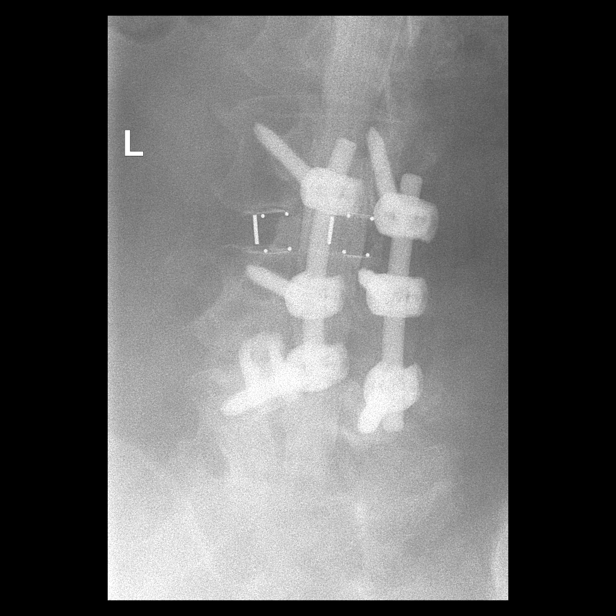

[13 of 15 positions shown; findings below may reference images not displayed]

EXAM:
LUMBAR MYELOGRAM

CT LUMBAR MYELOGRAM

FLUOROSCOPY TIME:  Radiation Exposure Index (as provided by the
fluoroscopic device): 21.8 mGy

Fluoroscopy Time:  19 seconds

Number of Acquired Images:  17

PROCEDURE:
After thorough discussion of risks and benefits of the procedure
including bleeding, infection, injury to nerves, blood vessels,
adjacent structures as well as headache and CSF leak, written and
oral informed consent was obtained. Consent was obtained by Dr.
Johaquin Mahesh. Time out form was completed.

Patient was positioned prone on the fluoroscopy table. Local
anesthesia was provided with 1% lidocaine without epinephrine after
prepped and draped in the usual sterile fashion. Puncture was
performed at L2-L3 using a 5 inch 22-gauge spinal needle via right
paramedian approach. Using a single pass through the dura, the
needle was placed within the thecal sac, with return of clear CSF.
15 mL of Isovue J-JAA was injected into the thecal sac, with normal
opacification of the nerve roots and cauda equina consistent with
free flow within the subarachnoid space.

I personally performed the lumbar puncture and administered the
intrathecal contrast. I also personally supervised acquisition of
the myelogram images.
FINDINGS: LUMBAR MYELOGRAM FINDINGS:

Unchanged L3-L5 PLIF. Unchanged 2 mm anterolisthesis at L4-L5.
Unchanged 4 mm anterolisthesis at L5-S1. No dynamic instability. No
significant ventral extradural defect. No significant spinal canal
stenosis or nerve root effacement.

CT LUMBAR MYELOGRAM FINDINGS:

Segmentation: Standard.

Alignment: Unchanged 2 mm anterolisthesis at L4-L5. Unchanged 3 mm
anterolisthesis at L5-S1.

Vertebrae: Prior L3-L5 PLIF with interval revision at L4-L5.
Unchanged pseudoarthrosis at L4-L5. Unchanged lucency surrounding
the left L5 pedicle screw. No acute fracture or other focal
pathologic process.

Conus medullaris and cauda equina: Conus extends to the L2 level.
Conus and cauda equina appear normal.

Paraspinal and other soft tissues: Negative.

Disc levels:

T12-L1: Unchanged minimal disc bulge and mild bilateral facet
arthropathy. Unchanged mild bilateral neuroforaminal stenosis.

L1-L2: Negative disc. Unchanged mild bilateral facet arthropathy. No
stenosis.

L2-L3: Unchanged minimal disc bulging and mild bilateral facet
arthropathy. Unchanged mild left neuroforaminal stenosis.

L3-L4: Unchanged partial interbody arthrodesis and posterior fusion.
No residual stenosis.

L4-L5: Unchanged pseudoarthrosis and interbody cage subsidence.
Interval posterior decompression with improved patency of the spinal
canal. New severe left neuroforaminal stenosis due to endplate
spurring and heterotopic ossification at the site of prior left
hemi-facetectomy and foraminotomy. Unchanged mild right
neuroforaminal stenosis.

L5-S1: Unchanged minimal disc bulging and severe bilateral facet
arthropathy with partial ankylosis. Unchanged moderate right
neuroforaminal stenosis. No spinal canal or left neuroforaminal
stenosis.
IMPRESSION: 1. Prior L3-L5 PLIF with interval revision and posterior
decompression at L4-L5, with resolved spinal canal stenosis.
Persistent pseudoarthrosis with loosening of the left L5 pedicle
screw. New severe left neuroforaminal stenosis due to progressive
endplate spurring and heterotopic ossification at the site of prior
left hemi-facetectomy and foraminotomy.
2. Unchanged moderate right neuroforaminal stenosis at L5-S1 due to
facet arthropathy.
3. Unchanged 2 mm L4-L5 and 4 mm L5-S1 anterolisthesis without
dynamic instability.

## 2018-08-14 IMAGING — CR LEFT SHOULDER - 2+ VIEW
3 series · 3 of 3 positions shown · non-contrast
Comparison: None.

CLINICAL DATA: Chronic right shoulder pain for 1 year.

EXAM:
LEFT SHOULDER - 2+ VIEW

[w shoulder grashey left]
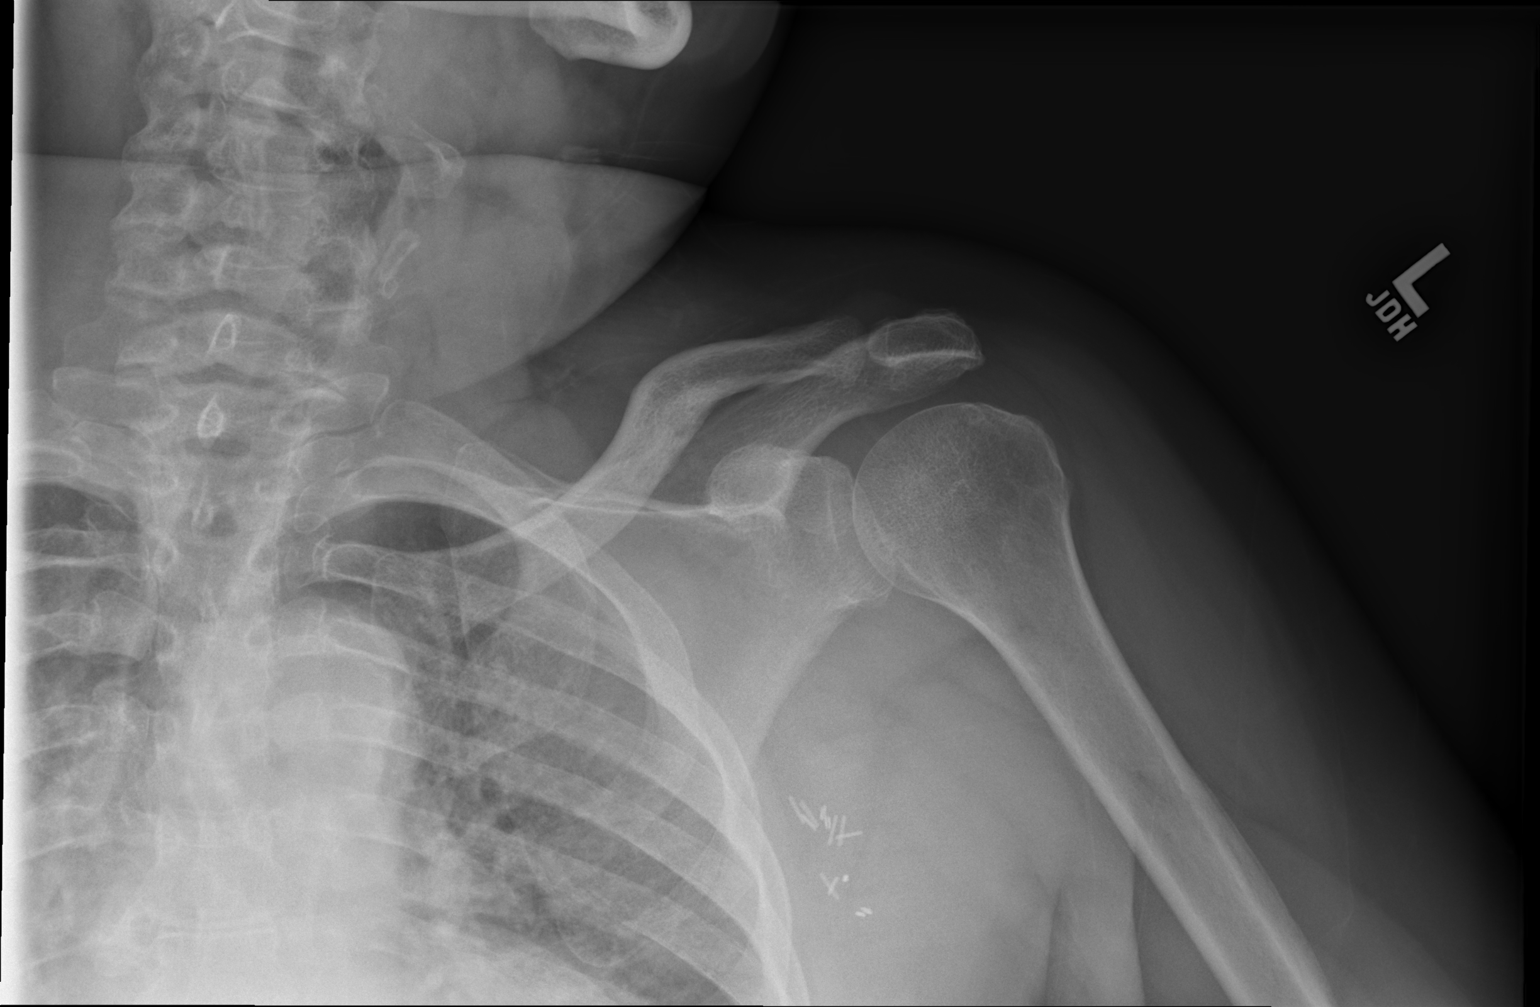

[w shoulder y-view left]
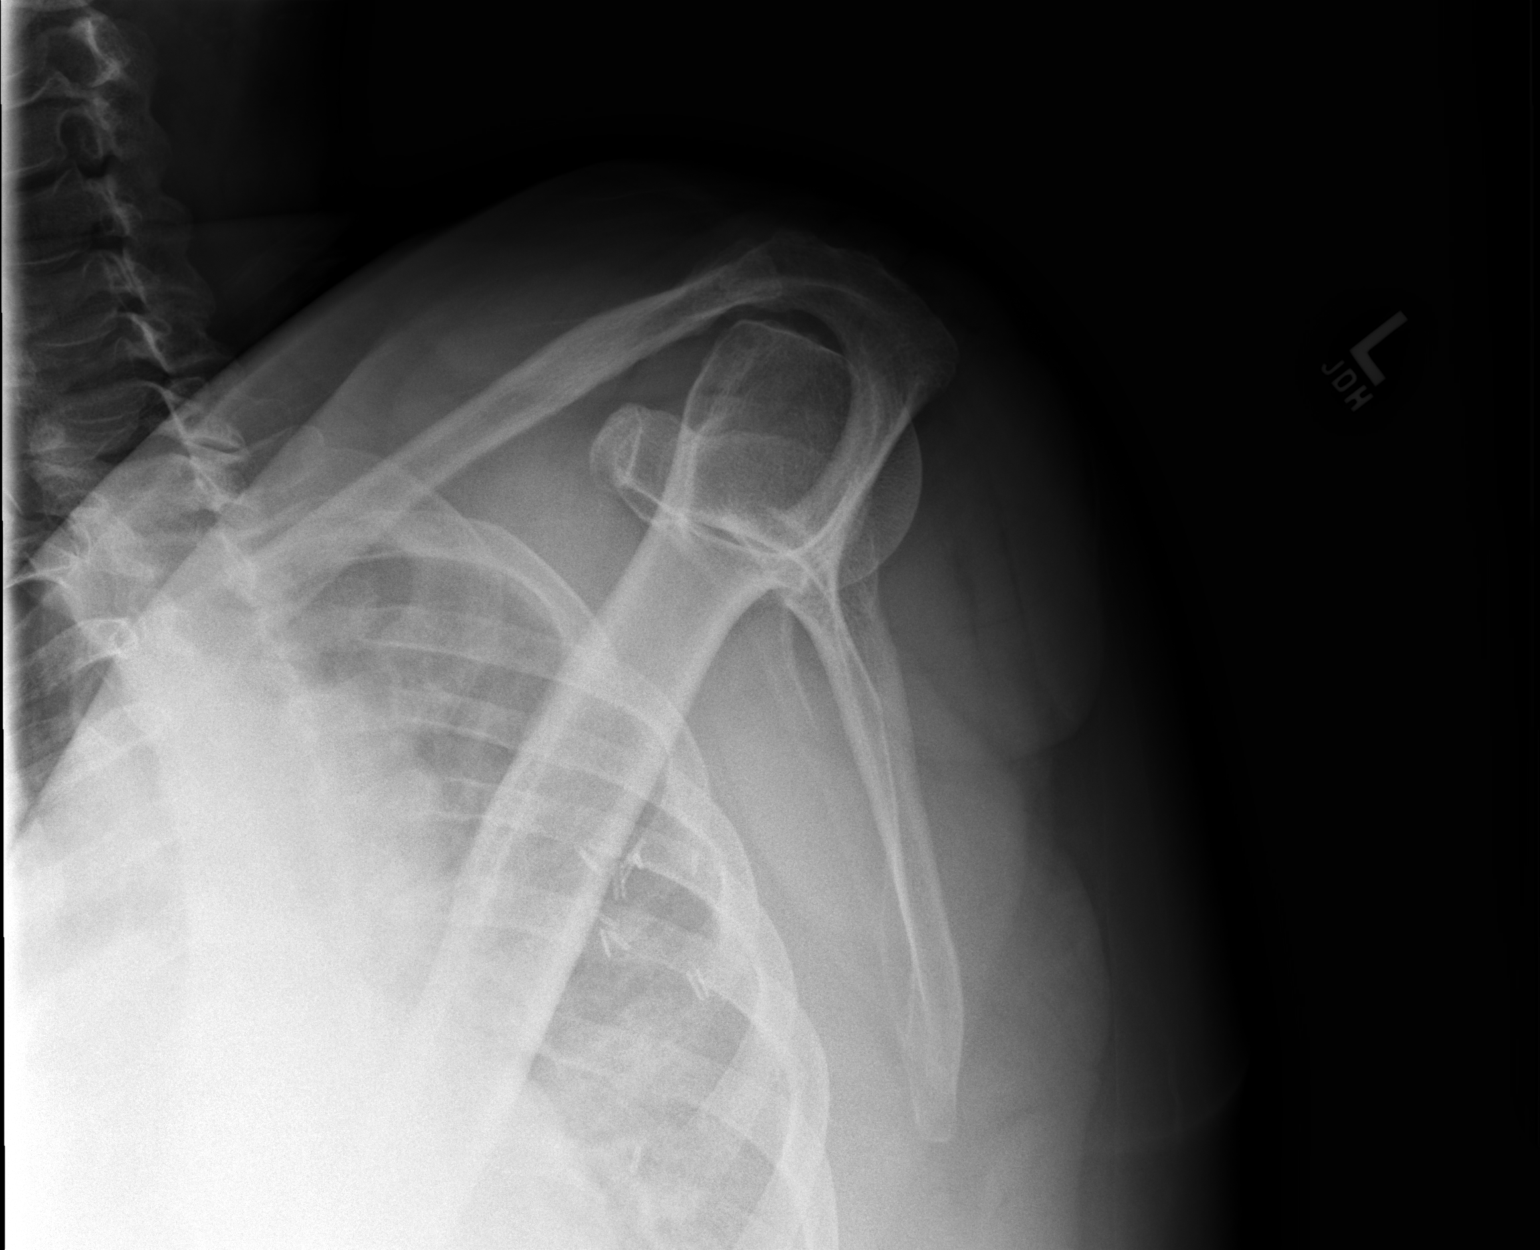

[w shoulder axillary left]
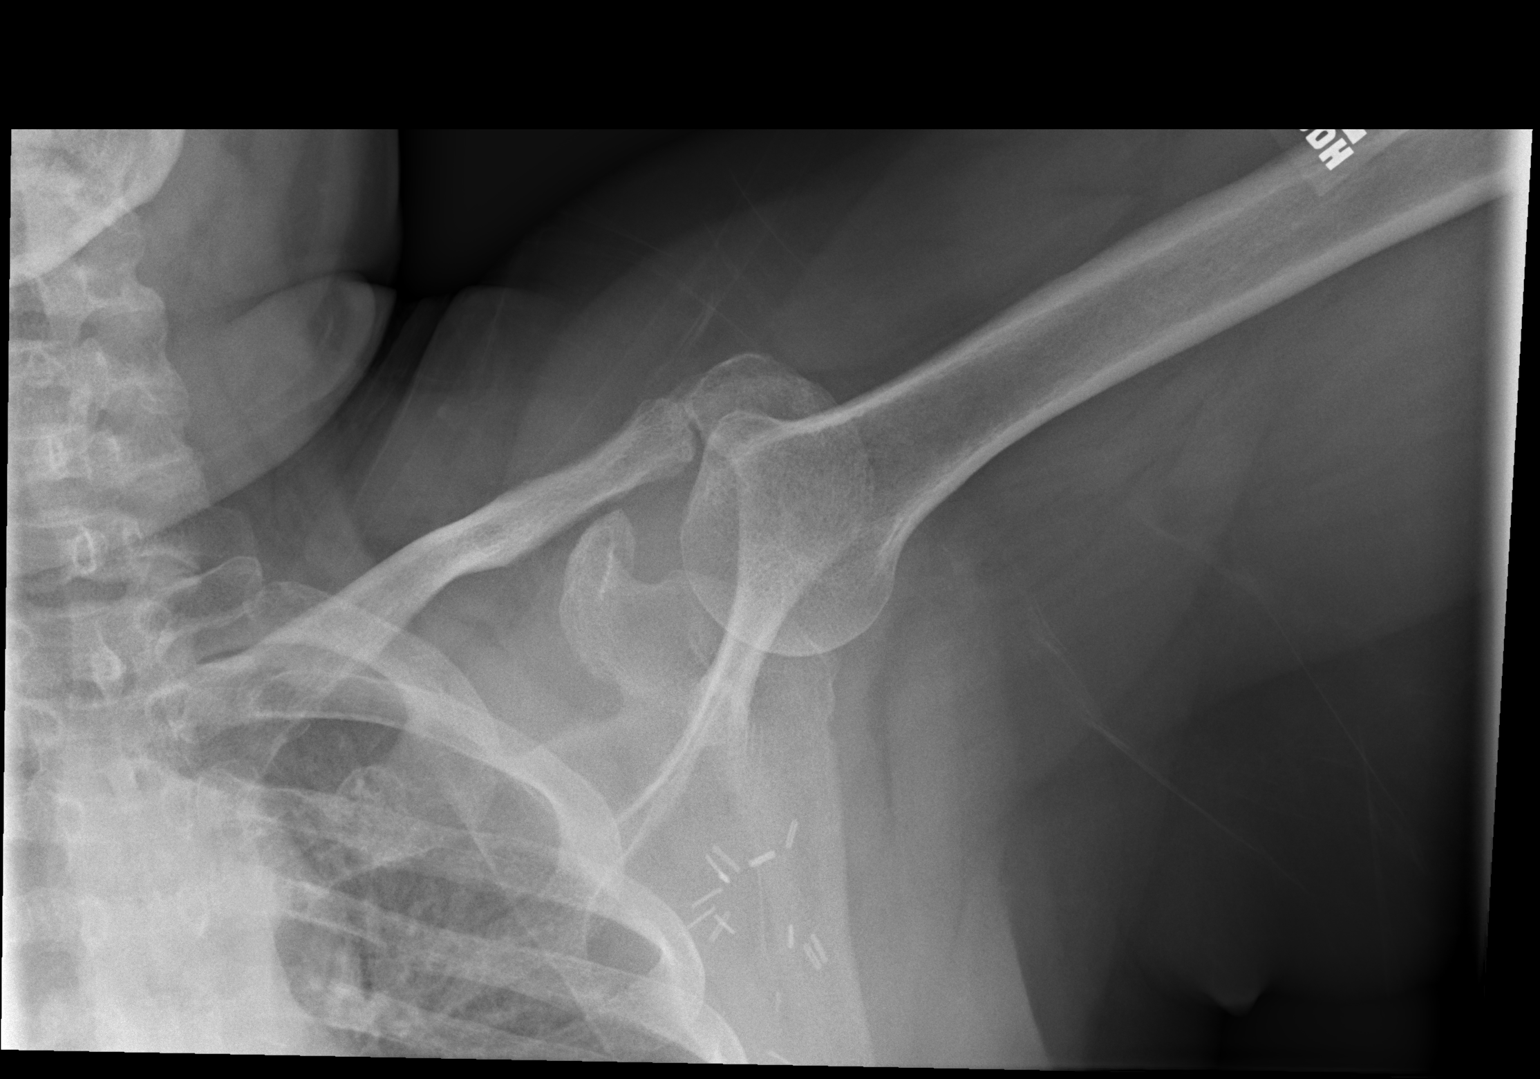

[3 of 3 positions shown; findings below may reference images not displayed]

FINDINGS: Images labeled left shoulder demonstrate no evidence of fracture or
dislocation. There is no evidence of arthropathy or other focal bone
abnormality. Postsurgical changes in the chest wall/axilla.
IMPRESSION: Negative.

## 2018-08-14 IMAGING — CT CT LUMBAR SPINE WITH CONTRAST
2 of 7 series · 5 of 14 positions shown, 6 images · non-contrast
Comparison: Lumbar spine x-rays dated August 05, 2018. CT lumbar
myelogram dated July 02, 2017.

CLINICAL DATA: Lumbosacral spondylosis without myelopathy with
radiculopathy. Chronic low back pain radiating into both legs.
TECHNIQUE: Contiguous axial images were obtained through the lumbar spine after
the intrathecal infusion of contrast. Coronal and sagittal
reconstructions were obtained of the axial image sets.

[Series 3: l spine soft · axial · 0.35mm/px · z∈[-220,-112]mm · 3 of 72 slices shown, 4 images]
[im 18/72  soft-tissue]
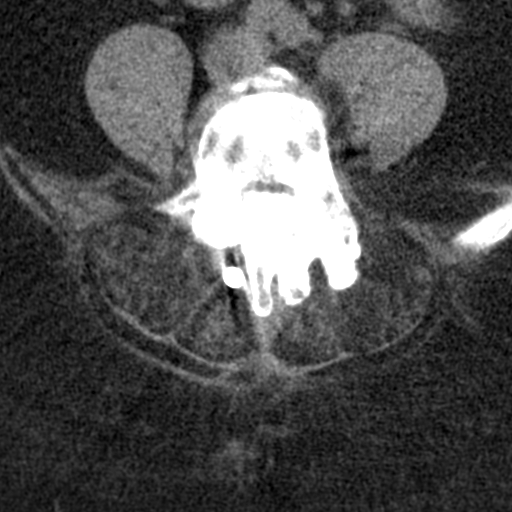
[im 18/72  bone]
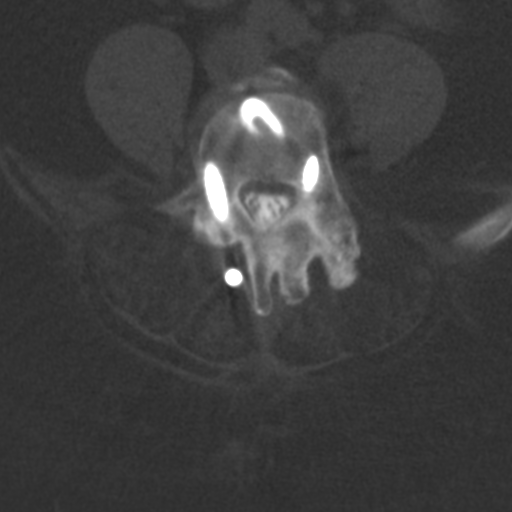
[im 36/72  bone]
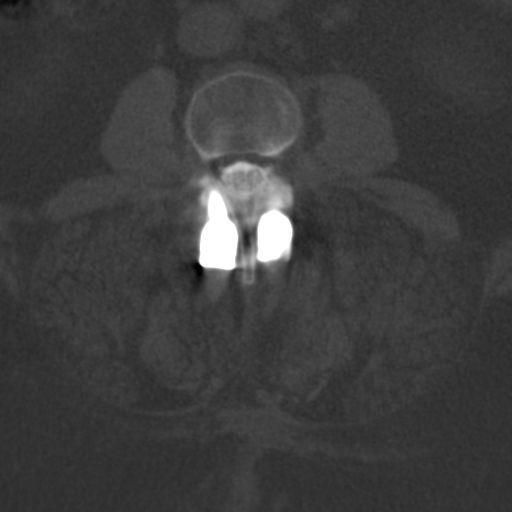
[im 54/72  bone]
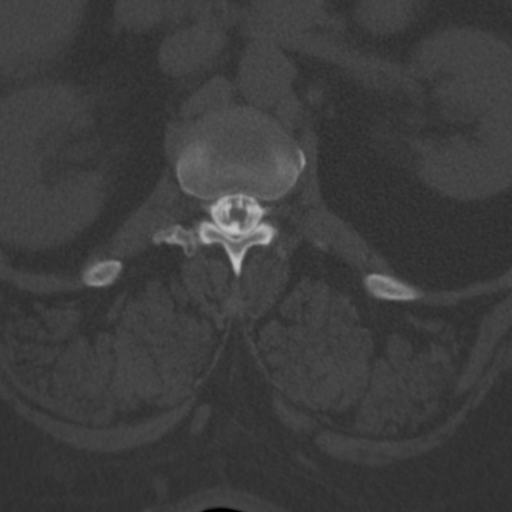

[Series 6: l spine soft (person_name) · axial · 0.35mm/px · z∈[-202,-130]mm · 2 of 72 slices shown]
[im 24/72  soft-tissue]
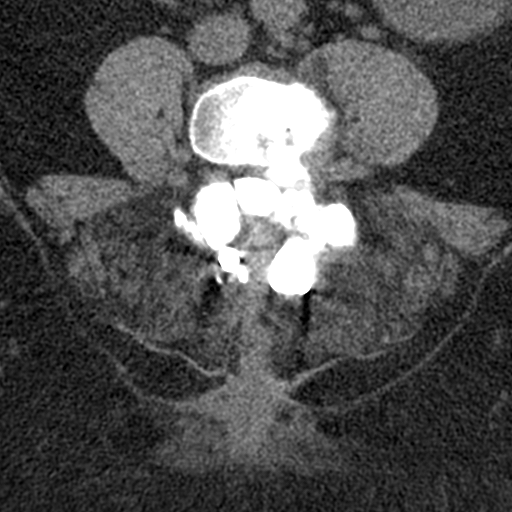
[im 48/72  soft-tissue]
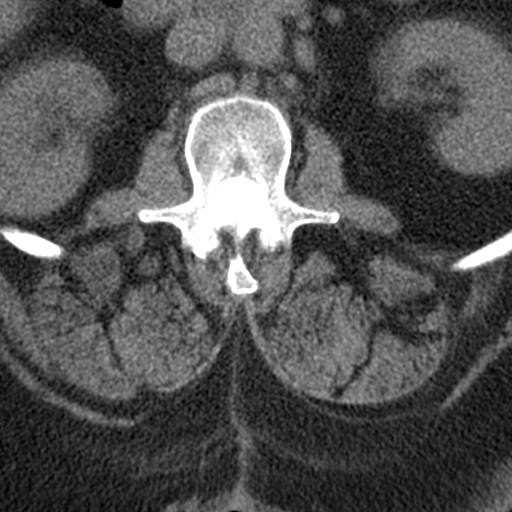

[5 of 14 positions shown; findings below may reference images not displayed]

EXAM:
LUMBAR MYELOGRAM

CT LUMBAR MYELOGRAM

FLUOROSCOPY TIME:  Radiation Exposure Index (as provided by the
fluoroscopic device): 21.8 mGy

Fluoroscopy Time:  19 seconds

Number of Acquired Images:  17

PROCEDURE:
After thorough discussion of risks and benefits of the procedure
including bleeding, infection, injury to nerves, blood vessels,
adjacent structures as well as headache and CSF leak, written and
oral informed consent was obtained. Consent was obtained by Dr.
Johaquin Mahesh. Time out form was completed.

Patient was positioned prone on the fluoroscopy table. Local
anesthesia was provided with 1% lidocaine without epinephrine after
prepped and draped in the usual sterile fashion. Puncture was
performed at L2-L3 using a 5 inch 22-gauge spinal needle via right
paramedian approach. Using a single pass through the dura, the
needle was placed within the thecal sac, with return of clear CSF.
15 mL of Isovue J-JAA was injected into the thecal sac, with normal
opacification of the nerve roots and cauda equina consistent with
free flow within the subarachnoid space.

I personally performed the lumbar puncture and administered the
intrathecal contrast. I also personally supervised acquisition of
the myelogram images.
FINDINGS: LUMBAR MYELOGRAM FINDINGS:

Unchanged L3-L5 PLIF. Unchanged 2 mm anterolisthesis at L4-L5.
Unchanged 4 mm anterolisthesis at L5-S1. No dynamic instability. No
significant ventral extradural defect. No significant spinal canal
stenosis or nerve root effacement.

CT LUMBAR MYELOGRAM FINDINGS:

Segmentation: Standard.

Alignment: Unchanged 2 mm anterolisthesis at L4-L5. Unchanged 3 mm
anterolisthesis at L5-S1.

Vertebrae: Prior L3-L5 PLIF with interval revision at L4-L5.
Unchanged pseudoarthrosis at L4-L5. Unchanged lucency surrounding
the left L5 pedicle screw. No acute fracture or other focal
pathologic process.

Conus medullaris and cauda equina: Conus extends to the L2 level.
Conus and cauda equina appear normal.

Paraspinal and other soft tissues: Negative.

Disc levels:

T12-L1: Unchanged minimal disc bulge and mild bilateral facet
arthropathy. Unchanged mild bilateral neuroforaminal stenosis.

L1-L2: Negative disc. Unchanged mild bilateral facet arthropathy. No
stenosis.

L2-L3: Unchanged minimal disc bulging and mild bilateral facet
arthropathy. Unchanged mild left neuroforaminal stenosis.

L3-L4: Unchanged partial interbody arthrodesis and posterior fusion.
No residual stenosis.

L4-L5: Unchanged pseudoarthrosis and interbody cage subsidence.
Interval posterior decompression with improved patency of the spinal
canal. New severe left neuroforaminal stenosis due to endplate
spurring and heterotopic ossification at the site of prior left
hemi-facetectomy and foraminotomy. Unchanged mild right
neuroforaminal stenosis.

L5-S1: Unchanged minimal disc bulging and severe bilateral facet
arthropathy with partial ankylosis. Unchanged moderate right
neuroforaminal stenosis. No spinal canal or left neuroforaminal
stenosis.
IMPRESSION: 1. Prior L3-L5 PLIF with interval revision and posterior
decompression at L4-L5, with resolved spinal canal stenosis.
Persistent pseudoarthrosis with loosening of the left L5 pedicle
screw. New severe left neuroforaminal stenosis due to progressive
endplate spurring and heterotopic ossification at the site of prior
left hemi-facetectomy and foraminotomy.
2. Unchanged moderate right neuroforaminal stenosis at L5-S1 due to
facet arthropathy.
3. Unchanged 2 mm L4-L5 and 4 mm L5-S1 anterolisthesis without
dynamic instability.

## 2018-08-14 IMAGING — CR DG MYELOGRAPHY LUMBAR INJ LUMBOSACRAL
3 series · 3 of 3 positions shown · non-contrast
Comparison: Lumbar spine x-rays dated August 05, 2018. CT lumbar
myelogram dated July 02, 2017.

CLINICAL DATA: Lumbosacral spondylosis without myelopathy with
radiculopathy. Chronic low back pain radiating into both legs.
TECHNIQUE: Contiguous axial images were obtained through the lumbar spine after
the intrathecal infusion of contrast. Coronal and sagittal
reconstructions were obtained of the axial image sets.

[w lumbar spine lat]
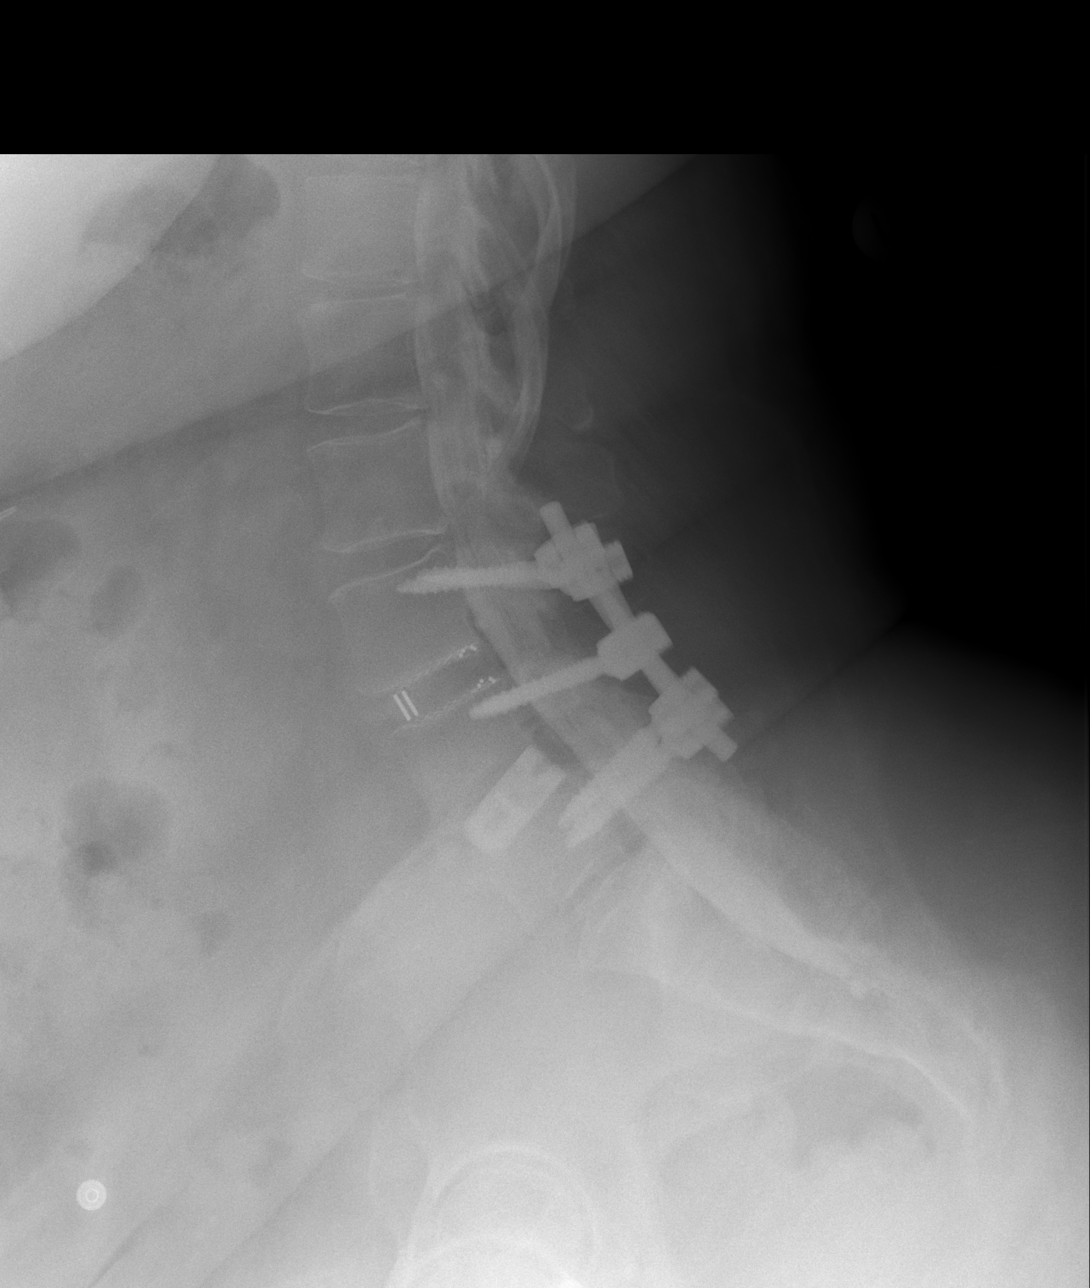

[w lumbar spine flexion]
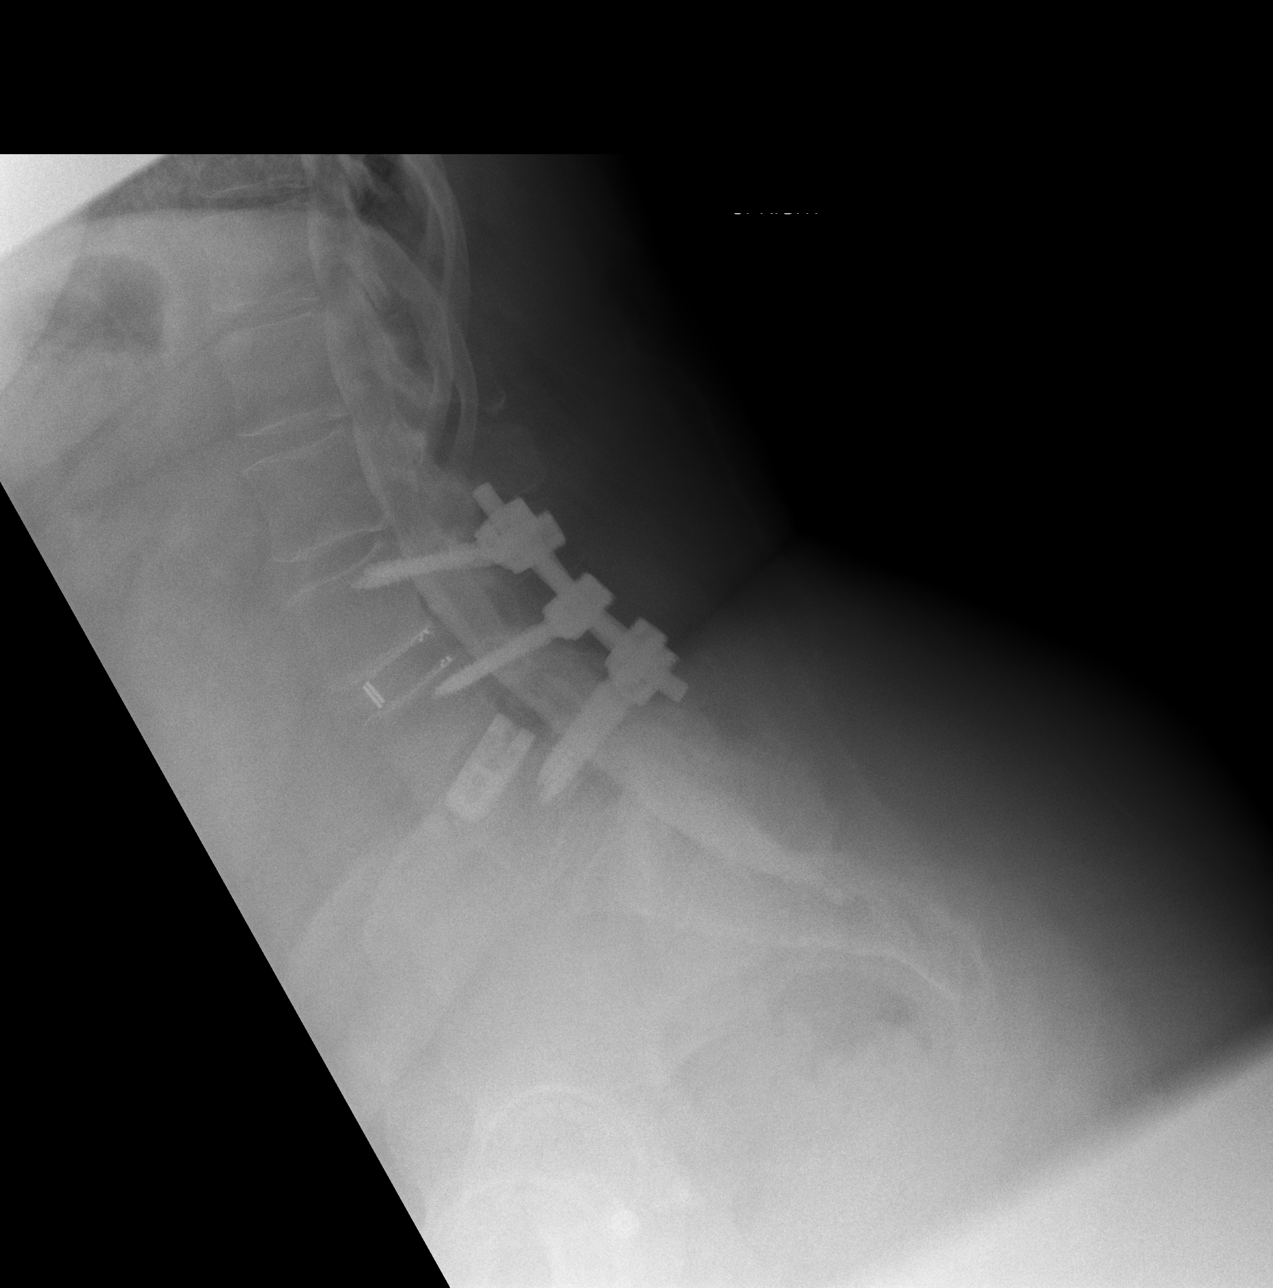

[w lumbar spine extension]
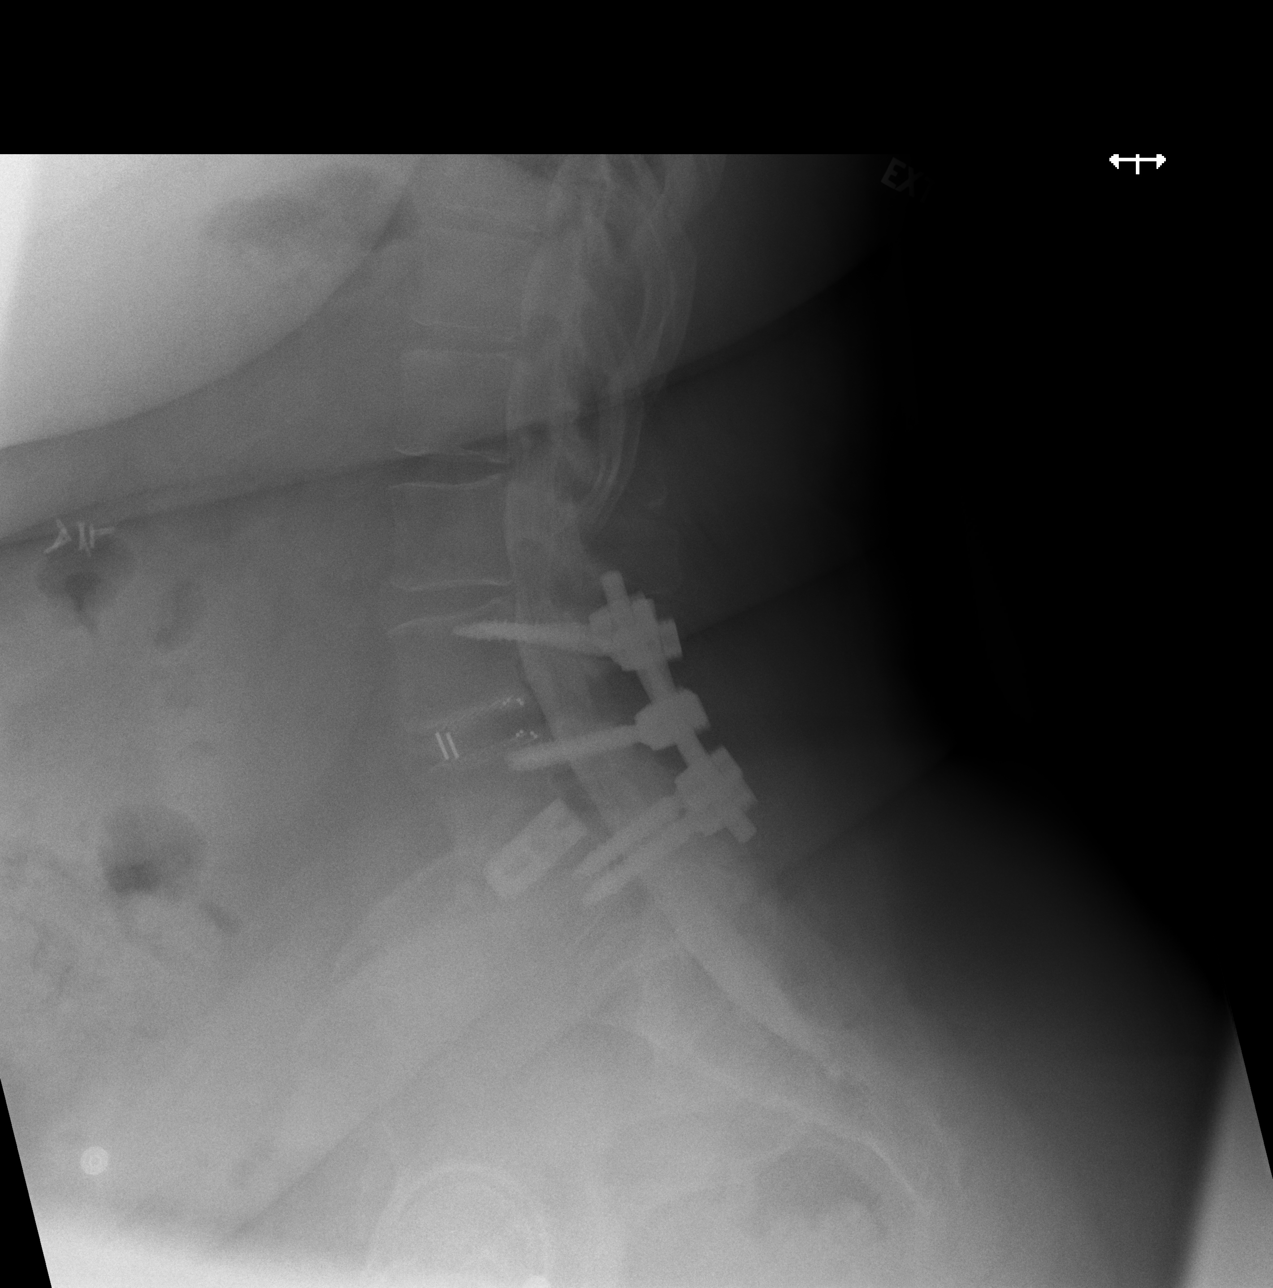

[3 of 3 positions shown; findings below may reference images not displayed]

EXAM:
LUMBAR MYELOGRAM

CT LUMBAR MYELOGRAM

FLUOROSCOPY TIME:  Radiation Exposure Index (as provided by the
fluoroscopic device): 21.8 mGy

Fluoroscopy Time:  19 seconds

Number of Acquired Images:  17

PROCEDURE:
After thorough discussion of risks and benefits of the procedure
including bleeding, infection, injury to nerves, blood vessels,
adjacent structures as well as headache and CSF leak, written and
oral informed consent was obtained. Consent was obtained by Dr.
Johaquin Mahesh. Time out form was completed.

Patient was positioned prone on the fluoroscopy table. Local
anesthesia was provided with 1% lidocaine without epinephrine after
prepped and draped in the usual sterile fashion. Puncture was
performed at L2-L3 using a 5 inch 22-gauge spinal needle via right
paramedian approach. Using a single pass through the dura, the
needle was placed within the thecal sac, with return of clear CSF.
15 mL of Isovue J-JAA was injected into the thecal sac, with normal
opacification of the nerve roots and cauda equina consistent with
free flow within the subarachnoid space.

I personally performed the lumbar puncture and administered the
intrathecal contrast. I also personally supervised acquisition of
the myelogram images.
FINDINGS: LUMBAR MYELOGRAM FINDINGS:

Unchanged L3-L5 PLIF. Unchanged 2 mm anterolisthesis at L4-L5.
Unchanged 4 mm anterolisthesis at L5-S1. No dynamic instability. No
significant ventral extradural defect. No significant spinal canal
stenosis or nerve root effacement.

CT LUMBAR MYELOGRAM FINDINGS:

Segmentation: Standard.

Alignment: Unchanged 2 mm anterolisthesis at L4-L5. Unchanged 3 mm
anterolisthesis at L5-S1.

Vertebrae: Prior L3-L5 PLIF with interval revision at L4-L5.
Unchanged pseudoarthrosis at L4-L5. Unchanged lucency surrounding
the left L5 pedicle screw. No acute fracture or other focal
pathologic process.

Conus medullaris and cauda equina: Conus extends to the L2 level.
Conus and cauda equina appear normal.

Paraspinal and other soft tissues: Negative.

Disc levels:

T12-L1: Unchanged minimal disc bulge and mild bilateral facet
arthropathy. Unchanged mild bilateral neuroforaminal stenosis.

L1-L2: Negative disc. Unchanged mild bilateral facet arthropathy. No
stenosis.

L2-L3: Unchanged minimal disc bulging and mild bilateral facet
arthropathy. Unchanged mild left neuroforaminal stenosis.

L3-L4: Unchanged partial interbody arthrodesis and posterior fusion.
No residual stenosis.

L4-L5: Unchanged pseudoarthrosis and interbody cage subsidence.
Interval posterior decompression with improved patency of the spinal
canal. New severe left neuroforaminal stenosis due to endplate
spurring and heterotopic ossification at the site of prior left
hemi-facetectomy and foraminotomy. Unchanged mild right
neuroforaminal stenosis.

L5-S1: Unchanged minimal disc bulging and severe bilateral facet
arthropathy with partial ankylosis. Unchanged moderate right
neuroforaminal stenosis. No spinal canal or left neuroforaminal
stenosis.
IMPRESSION: 1. Prior L3-L5 PLIF with interval revision and posterior
decompression at L4-L5, with resolved spinal canal stenosis.
Persistent pseudoarthrosis with loosening of the left L5 pedicle
screw. New severe left neuroforaminal stenosis due to progressive
endplate spurring and heterotopic ossification at the site of prior
left hemi-facetectomy and foraminotomy.
2. Unchanged moderate right neuroforaminal stenosis at L5-S1 due to
facet arthropathy.
3. Unchanged 2 mm L4-L5 and 4 mm L5-S1 anterolisthesis without
dynamic instability.

## 2018-08-14 MED ORDER — DIAZEPAM 5 MG PO TABS
10.0000 mg | ORAL_TABLET | Freq: Once | ORAL | Status: AC
  Administered 2018-08-14: 10 mg via ORAL

## 2018-08-14 MED ORDER — IOPAMIDOL (ISOVUE-M 200) INJECTION 41%
15.0000 mL | Freq: Once | INTRAMUSCULAR | Status: AC
  Administered 2018-08-14: 15 mL via INTRATHECAL

## 2018-08-14 MED ORDER — MEPERIDINE HCL 50 MG/ML IJ SOLN
50.0000 mg | Freq: Once | INTRAMUSCULAR | Status: AC
  Administered 2018-08-14: 50 mg via INTRAMUSCULAR

## 2018-08-14 MED ORDER — ONDANSETRON HCL 4 MG/2ML IJ SOLN
4.0000 mg | Freq: Once | INTRAMUSCULAR | Status: AC
  Administered 2018-08-14: 4 mg via INTRAMUSCULAR

## 2018-08-14 NOTE — Discharge Instructions (Signed)
Myelogram Discharge Instructions  1. Go home and rest quietly for the next 24 hours.  It is important to lie flat for the next 24 hours.  Get up only to go to the restroom.  You may lie in the bed or on a couch on your back, your stomach, your left side or your right side.  You may have one pillow under your head.  You may have pillows between your knees while you are on your side or under your knees while you are on your back.  2. DO NOT drive today.  Recline the seat as far back as it will go, while still wearing your seat belt, on the way home.  3. You may get up to go to the bathroom as needed.  You may sit up for 10 minutes to eat.  You may resume your normal diet and medications unless otherwise indicated.  Drink lots of extra fluids today and tomorrow.  4. The incidence of headache, nausea, or vomiting is about 5% (one in 20 patients).  If you develop a headache, lie flat and drink plenty of fluids until the headache goes away.  Caffeinated beverages may be helpful.  If you develop severe nausea and vomiting or a headache that does not go away with flat bed rest, call [phone].  5. You may resume normal activities after your 24 hours of bed rest is over; however, do not exert yourself strongly or do any heavy lifting tomorrow. If when you get up you have a headache when standing, go back to bed and force fluids for another 24 hours.  6. Call your physician for a follow-up appointment.  The results of your myelogram will be sent directly to your physician by the following day.  7. If you have any questions or if complications develop after you arrive home, please call [phone].  Discharge instructions have been explained to the patient.  The patient, or the person responsible for the patient, fully understands these instructions.  YOU MAY RESTART YOUR CYMBALTA TOMORROW 08/15/2018 AT 10:30AM.

## 2018-08-16 ENCOUNTER — Other Ambulatory Visit: Payer: Self-pay | Admitting: Registered Nurse

## 2018-08-26 ENCOUNTER — Other Ambulatory Visit: Payer: Self-pay | Admitting: Registered Nurse

## 2018-09-08 ENCOUNTER — Encounter: Payer: Self-pay | Admitting: Physical Medicine & Rehabilitation

## 2018-09-08 ENCOUNTER — Other Ambulatory Visit: Payer: Self-pay

## 2018-09-08 ENCOUNTER — Encounter: Payer: Medicare Other | Attending: Physical Medicine & Rehabilitation

## 2018-09-08 ENCOUNTER — Ambulatory Visit (HOSPITAL_BASED_OUTPATIENT_CLINIC_OR_DEPARTMENT_OTHER): Payer: Medicare Other | Admitting: Physical Medicine & Rehabilitation

## 2018-09-08 VITALS — BP 161/93 | HR 74 | Ht 63.0 in | Wt 301.0 lb

## 2018-09-08 DIAGNOSIS — G8918 Other acute postprocedural pain: Secondary | ICD-10-CM | POA: Insufficient documentation

## 2018-09-08 DIAGNOSIS — K219 Gastro-esophageal reflux disease without esophagitis: Secondary | ICD-10-CM | POA: Insufficient documentation

## 2018-09-08 DIAGNOSIS — Z9889 Other specified postprocedural states: Secondary | ICD-10-CM | POA: Diagnosis not present

## 2018-09-08 DIAGNOSIS — M25512 Pain in left shoulder: Secondary | ICD-10-CM

## 2018-09-08 DIAGNOSIS — M48061 Spinal stenosis, lumbar region without neurogenic claudication: Secondary | ICD-10-CM | POA: Insufficient documentation

## 2018-09-08 DIAGNOSIS — G8929 Other chronic pain: Secondary | ICD-10-CM | POA: Insufficient documentation

## 2018-09-08 DIAGNOSIS — F329 Major depressive disorder, single episode, unspecified: Secondary | ICD-10-CM | POA: Insufficient documentation

## 2018-09-08 DIAGNOSIS — Z853 Personal history of malignant neoplasm of breast: Secondary | ICD-10-CM | POA: Diagnosis not present

## 2018-09-08 DIAGNOSIS — G4733 Obstructive sleep apnea (adult) (pediatric): Secondary | ICD-10-CM | POA: Diagnosis not present

## 2018-09-08 DIAGNOSIS — M797 Fibromyalgia: Secondary | ICD-10-CM | POA: Insufficient documentation

## 2018-09-08 DIAGNOSIS — M545 Low back pain: Secondary | ICD-10-CM | POA: Diagnosis present

## 2018-09-08 MED ORDER — PREGABALIN 100 MG PO CAPS: ORAL_CAPSULE | ORAL | 5 refills | Status: DC

## 2018-09-08 NOTE — Patient Instructions (Signed)

## 2018-09-08 NOTE — Progress Notes (Signed)
Shoulder injection left subacromial Using ultrasound guidance due to morbid obesity  Indication:LEFT Shoulder pain not relieved by medication management and other conservative care.  Informed consent was obtained after describing risks and benefits of the procedure with the patient, this includes bleeding, bruising, infection and medication side effects. The patient wishes to proceed and has given written consent. Patient was placed in a seated position. The Left shoulder was marked and prepped with betadine in the subacromial area. A 25-gauge 1-1/2 inch needle was inserted into the subacromial area. After negative draw back for blood, a solution containing 1 mL of 6 mg per ML betamethasone and 4 mL of 1% lidocaine was injected. A band aid was applied. The patient tolerated the procedure well. Post procedure instructions were given.

## 2018-09-26 ENCOUNTER — Other Ambulatory Visit: Payer: Self-pay

## 2018-09-26 NOTE — Telephone Encounter (Signed)
Patient called and requested a refill of hydrocodone-apap7.5-325mg .  Verified PMP website, last refill was 08-04-2018.

## 2018-09-29 ENCOUNTER — Encounter: Payer: Self-pay | Admitting: Physical Medicine & Rehabilitation

## 2018-09-29 ENCOUNTER — Other Ambulatory Visit: Payer: Self-pay

## 2018-09-29 ENCOUNTER — Ambulatory Visit (HOSPITAL_BASED_OUTPATIENT_CLINIC_OR_DEPARTMENT_OTHER): Payer: Medicare Other | Admitting: Physical Medicine & Rehabilitation

## 2018-09-29 VITALS — BP 126/80 | Ht 63.0 in | Wt 294.0 lb

## 2018-09-29 DIAGNOSIS — Z981 Arthrodesis status: Secondary | ICD-10-CM | POA: Diagnosis not present

## 2018-09-29 DIAGNOSIS — M961 Postlaminectomy syndrome, not elsewhere classified: Secondary | ICD-10-CM | POA: Diagnosis not present

## 2018-09-29 MED ORDER — HYDROCODONE-ACETAMINOPHEN 7.5-325 MG PO TABS: ORAL_TABLET | ORAL | 0 refills | Status: DC

## 2018-09-29 NOTE — Progress Notes (Signed)
Subjective:    Patient ID: Kelsey Wilson, female    DOB: 07/09/1960, 58 y.o.   MRN: [id number] Posterior Lateral Fusion L-3-4-L-5 removal and replacement of hardware, Laminectomy L4-L5 by Dr. Ronnald Ramp on 11/29/2017.  Consent for phone visit HPI Left shoulder injection 09/08/2018, improved after injection  Seen Dr Ronnald Ramp NS , phone visit ordered PT   Low back pain near buttocks, bilateral   No radiating pain down legs  Taking cyclobenzaprine for finger and toe   Just diagnosed with diabetes using insulin  Taking pregabalin for nerve pain  Remains independent for self care and mobility  Pain Inventory Average Pain 5 Pain Right Now 5 My pain is constant, dull and aching  In the last 24 hours, has pain interfered with the following? General activity 5 Relation with others 5 Enjoyment of life 5 What TIME of day is your pain at its worst? daytime Sleep (in general) Fair  Pain is worse with: walking, bending, standing and some activites Pain improves with: rest and medication Relief from Meds: 3  Mobility use a cane ability to climb steps?  no do you drive?  yes  Function disabled: date disabled 2010  Neuro/Psych weakness numbness tingling trouble walking spasms anxiety  Prior Studies Any changes since last visit?  no  Physicians involved in your care Any changes since last visit?  no   Family History  Problem Relation Age of Onset  . Hypertension Mother   . Diabetes Mother   . Hypertension Father   . Diabetes Father   . Cancer Paternal Grandmother        unknown  . Colon cancer Neg Hx    Social History   Socioeconomic History  . Marital status: Single    Spouse name: Not on file  . Number of children: 3  . Years of education: Not on file  . Highest education level: Not on file  Occupational History  . Not on file  Social Needs  . Financial resource strain: Not on file  . Food insecurity:    Worry: Not on file    Inability: Not on  file  . Transportation needs:    Medical: Not on file    Non-medical: Not on file  Tobacco Use  . Smoking status: Never Smoker  . Smokeless tobacco: Never Used  Substance and Sexual Activity  . Alcohol use: No    Alcohol/week: 0.0 standard drinks  . Drug use: No  . Sexual activity: Never    Birth control/protection: Post-menopausal, Surgical  Lifestyle  . Physical activity:    Days per week: Not on file    Minutes per session: Not on file  . Stress: Not on file  Relationships  . Social connections:    Talks on phone: Not on file    Gets together: Not on file    Attends religious service: Not on file    Active member of club or organization: Not on file    Attends meetings of clubs or organizations: Not on file    Relationship status: Not on file  Other Topics Concern  . Not on file  Social History Narrative  . Not on file   Past Surgical History:  Procedure Laterality Date  . ABDOMINAL HYSTERECTOMY     still has ovaries  . APPENDECTOMY    . AXILLARY LYMPH NODE DISSECTION  11/28/2011   Procedure: AXILLARY LYMPH NODE DISSECTION;  Surgeon: Edward Jolly, MD;  Location: [facility];  Service: General;  Laterality:  Left;  . BREAST LUMPECTOMY Left 11/28/2011   Malignant  . BREAST SURGERY Left 2013  . CARPAL TUNNEL RELEASE     Bilateral  . CESAREAN SECTION     pt. has had 3  . CHOLECYSTECTOMY    . COLONOSCOPY    . EYE SURGERY Bilateral    cataract removal  . KNEE SURGERY     Left Knee  . LAMINECTOMY WITH POSTERIOR LATERAL ARTHRODESIS LEVEL 1 N/A 11/29/2017   Procedure: Posterior Lateral Fusion - Lumbar Four-Lumbar Five, removal and replacement of hardware, Laminectomy - Lumbar Four-Lumbar Five;  Surgeon: Eustace Moore, MD;  Location: [facility];  Service: Neurosurgery;  Laterality: N/A;  . LAMINECTOMY WITH POSTERIOR LATERAL ARTHRODESIS LEVEL 2 N/A 06/07/2017   Procedure: Posterior Lateral Fusion Lumbar Three-Four and Transforaminal Interbody Fusion Lumbar Four-Five with  Segmental  Pedicle Screw Fixation;  Surgeon: Eustace Moore, MD;  Location: [facility];  Service: Neurosurgery;  Laterality: N/A;  Posterior Lateral Fusion Lumbar Three-Four and Transforaminal Interbody Fusion Lumbar Four-Five with Segmental  Pedicle Screw Fixation   . LUMBAR FUSION  06/07/2017   POST  . LUMBAR LAMINECTOMY/DECOMPRESSION MICRODISCECTOMY Bilateral 01/27/2016   Procedure: Laminectomy and Foraminotomy - Lumbar four -Lumbar five - bilateral- on-lay noninstrumented fusion;  Surgeon: Eustace Moore, MD;  Location: [facility];  Service: Neurosurgery;  Laterality: Bilateral;  . MULTIPLE EXTRACTIONS WITH ALVEOLOPLASTY N/A 05/11/2016   Procedure: EXTRACTION OF TEETH EIGHTEEN, TWENTY AND TWENTY- NINE;  REMOVAL OF MANDIBULAR TORUS AND EXOSTOSIS;  Surgeon: Diona Browner, DDS;  Location: [facility];  Service: Oral Surgery;  Laterality: N/A;  . PORTACATH PLACEMENT  06/18/2011   Procedure: INSERTION PORT-A-CATH;  Surgeon: Edward Jolly, MD;  Location: [facility];  Service: General;  Laterality: Right;  right subclavian  . removal portacath  2014  . spur     Apex spur on both big toes  . TOE SURGERY Bilateral   . TONSILLECTOMY    . TOTAL KNEE ARTHROPLASTY Left 09/13/2014  . TOTAL KNEE ARTHROPLASTY Left 09/13/2014   Procedure: LEFT TOTAL KNEE ARTHROPLASTY;  Surgeon: Rod Can, MD;  Location: [place] [facility];  Service: Orthopedics;  Laterality: Left;  . TOTAL KNEE ARTHROPLASTY Right 03/10/2015   Procedure: RIGHT TOTAL KNEE ARTHROPLASTY;  Surgeon: Rod Can, MD;  Location: [facility];  Service: Orthopedics;  Laterality: Right;   Past Medical History:  Diagnosis Date  . Anemia   . Anxiety   . Arthritis   . Breast cancer ([place]) 2010   T3N1 invasive ductal carcinoma left breast.Takes Arimidex daily  . Bursitis   . Carpal tunnel syndrome   . Chronic back pain    stenosis  . Constipation    takes Colace daily  . Depression    takes Benzotropine daily  . Diverticulitis of colon   . Dyspnea     daily when walking for over 1 yr.  . Fibromyalgia 08/2012  . GERD (gastroesophageal reflux disease)    takes Dexilant daily  . Hemorrhoid   . History of blood transfusion    no abnormal reaction noted  . History of colon polyps    benign  . History of shingles   . Joint pain   . Joint swelling   . Night muscle spasms    takes Flexeril nightly as needed  . Nocturia   . OSA (obstructive sleep apnea)   . OSA on CPAP   . Peripheral edema    takes Furosemide.Just started 01/18/16  . Peripheral neuropathy  takes Lyrica daily  . Personal history of chemotherapy    2013  . Personal history of radiation therapy    2013  . Pneumonia    hx of-2015  . Pseudoarthrosis of lumbar spine   . Seasonal allergies    takes Singulair nightly  . SOB (shortness of breath) on exertion    rarely with exertion  . 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 Alphonzo Grieve)   There were no vitals taken for this visit.  Opioid Risk Score:   Fall Risk Score:  `1  Depression screen PHQ 2/9  Depression screen [facility] 2/9 08/04/2018 06/06/2018 04/04/2018 01/13/2018 12/13/2017 06/21/2017 03/07/2017  Decreased Interest 0 0 0 3 3 1 2   Down, Depressed, Hopeless 0 0 0 3 3 1 2   PHQ - 2 Score 0 0 0 6 6 2 4   Altered sleeping - - - - - - 0  Tired, decreased energy - - - - - - 3  Change in appetite - - - - - - 3  Feeling bad or failure about yourself  - - - - - - 2  Trouble concentrating - - - - - - 3  Moving slowly or fidgety/restless - - - - - - 3  Suicidal thoughts - - - - - - 0  PHQ-9 Score - - - - - - 18  Difficult doing work/chores - - - - - - Extremely dIfficult  Some recent data might be hidden  '  Review of Systems  Constitutional: Negative.   HENT: Negative.   Eyes: Negative.   Respiratory: Negative.   Cardiovascular: Negative.   Gastrointestinal: Negative.   Endocrine: Negative.    Genitourinary: Negative.   Musculoskeletal: Positive for arthralgias, back pain, gait problem and myalgias.  Skin: Negative.   Allergic/Immunologic: Negative.   Neurological: Positive for weakness and numbness.  Hematological: Negative.   Psychiatric/Behavioral: The patient is nervous/anxious.   All other systems reviewed and are negative.      Objective:   Physical Exam Nursing note reviewed.  Neurological:     Mental Status: She is alert and oriented to person, place, and time. Mental status is at baseline.  Psychiatric:        Mood and Affect: Mood normal.        Behavior: Behavior normal.    Remainder of exam deferred due to phone visit       Assessment & Plan:  1.  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  Primarily has central  Buttocks pain, no radicular symptoms  Discussed the location of pain and likelihood that this may be generated by the sacroiliac joint (>33% pts post lumbar  Fusion) Will schedule bilateral Sacroiliac injection under fluoro guidance in 3-4 wks  Cont Norco 7.5mg  QID, last UDS 06/06/2018 was consistent  Agree with PT  2.  Left subacromial bursitis improved after corticosteroid injection 3wks ago    Duration of phone visit 79minutes

## 2018-09-29 NOTE — Telephone Encounter (Signed)
Appointment made and first half of visit completed

## 2018-10-10 ENCOUNTER — Other Ambulatory Visit: Payer: Self-pay | Admitting: Adult Health

## 2018-10-10 ENCOUNTER — Other Ambulatory Visit: Payer: Self-pay | Admitting: Oncology

## 2018-10-10 DIAGNOSIS — Z170 Estrogen receptor positive status [ER+]: Secondary | ICD-10-CM

## 2018-10-10 DIAGNOSIS — C50412 Malignant neoplasm of upper-outer quadrant of left female breast: Secondary | ICD-10-CM

## 2018-10-14 ENCOUNTER — Other Ambulatory Visit: Payer: Self-pay | Admitting: Oncology

## 2018-10-14 NOTE — Progress Notes (Signed)
Called back and spoke with Dr. Melba Coon regarding the use of vaginal estrogens for [facility].  Given the history of partial occlusion of the splenic vein, with a history of splenorenal varices, for which she required anticoagulation in the past, I would be uncomfortable switching her to tamoxifen.  On the other hand she may go off anastrozole since she has completed 7 years on this medication which is the maximal time that we have data for  Unfortunately we do not have data regarding the safety of vaginal estrogens in patients with Kelsey Wilson's situation.  Sometimes however the likely very small risk of clotting or breast cancer promotion may be overweight by the quality of life benefits  Tashianna does have a visit with me in October.  That likely will be her "graduation" visit.

## 2018-10-21 ENCOUNTER — Other Ambulatory Visit: Payer: Self-pay

## 2018-10-21 ENCOUNTER — Encounter: Payer: Medicare Other | Attending: Physical Medicine & Rehabilitation | Admitting: Physical Medicine & Rehabilitation

## 2018-10-21 ENCOUNTER — Encounter: Payer: Self-pay | Admitting: Physical Medicine & Rehabilitation

## 2018-10-21 DIAGNOSIS — F329 Major depressive disorder, single episode, unspecified: Secondary | ICD-10-CM | POA: Insufficient documentation

## 2018-10-21 DIAGNOSIS — M48061 Spinal stenosis, lumbar region without neurogenic claudication: Secondary | ICD-10-CM | POA: Insufficient documentation

## 2018-10-21 DIAGNOSIS — G8929 Other chronic pain: Secondary | ICD-10-CM | POA: Diagnosis not present

## 2018-10-21 DIAGNOSIS — G4733 Obstructive sleep apnea (adult) (pediatric): Secondary | ICD-10-CM | POA: Insufficient documentation

## 2018-10-21 DIAGNOSIS — Z9889 Other specified postprocedural states: Secondary | ICD-10-CM | POA: Insufficient documentation

## 2018-10-21 DIAGNOSIS — M533 Sacrococcygeal disorders, not elsewhere classified: Secondary | ICD-10-CM

## 2018-10-21 DIAGNOSIS — M797 Fibromyalgia: Secondary | ICD-10-CM | POA: Insufficient documentation

## 2018-10-21 DIAGNOSIS — M545 Low back pain: Secondary | ICD-10-CM | POA: Insufficient documentation

## 2018-10-21 DIAGNOSIS — K219 Gastro-esophageal reflux disease without esophagitis: Secondary | ICD-10-CM | POA: Insufficient documentation

## 2018-10-21 DIAGNOSIS — G8918 Other acute postprocedural pain: Secondary | ICD-10-CM | POA: Insufficient documentation

## 2018-10-21 DIAGNOSIS — Z853 Personal history of malignant neoplasm of breast: Secondary | ICD-10-CM | POA: Diagnosis not present

## 2018-10-21 MED ORDER — HYDROCODONE-ACETAMINOPHEN 7.5-325 MG PO TABS: ORAL_TABLET | ORAL | 0 refills | Status: DC

## 2018-10-21 NOTE — Progress Notes (Signed)

## 2018-10-21 NOTE — Patient Instructions (Signed)
Sacroiliac injection was performed today. A combination of a naming medicine plus a cortisone medicine was injected. The injection was done under x-ray guidance. This procedure has been performed to help reduce low back and buttocks pain as well as potentially hip pain. The duration of this injection is variable lasting from hours to  Months. It may repeated if needed. 

## 2018-10-21 NOTE — Progress Notes (Signed)
  PROCEDURE RECORD [facility]   Name: Kelsey Wilson DOB:[birth year] MRN: [id number]  Date:10/21/2018  Physician: Alysia Penna, MD    Nurse/CMA: Darrold Bezek,CMA/Lee,CMA  Allergies:  Allergies  Allergen Reactions  . Aleve [Naproxen] Nausea Only  . Compazine [Prochlorperazine] Other (See Comments)    Numbness of face and  lips   . Hydrocodone Itching    High dose only  . Oxycodone Other (See Comments)    hallucinations   . Penicillins Nausea Only and Other (See Comments)    Has patient had a PCN reaction causing immediate rash, facial/tongue/throat swelling, SOB or lightheadedness with hypotension: No Has patient had a PCN reaction causing severe rash involving mucus membranes or skin necrosis: No Has patient had a PCN reaction that required hospitalization No Has patient had a PCN reaction occurring within the last 10 years: No If all of the above answers are "NO", then may proceed with Cephalosporin use.    Consent Signed: Yes.    Is patient diabetic? Yes.    CBG today? 46  Pregnant: No. LMP: No LMP recorded. Patient has had a hysterectomy. (age 16-55)  Anticoagulants: no Anti-inflammatory: no Antibiotics: no  Procedure: bilateral sacroiliac steroid injection  Position: Prone Start Time: 2:11pm    End Time: 2:24pm  Fluoro Time: 59  RN/CMA Azizah Lisle, CMA Lee, CMA    Time 1:43pm 2:30pm    BP 134/90 126 85    Pulse 89 84    Respirations 14 14    O2 Sat 97 97    S/S 6 6    Pain Level 7/10 5/10     D/C home with self, patient A & O X 3, D/C instructions reviewed, and sits independently.

## 2018-10-23 ENCOUNTER — Ambulatory Visit (INDEPENDENT_AMBULATORY_CARE_PROVIDER_SITE_OTHER): Payer: Medicare Other

## 2018-10-23 ENCOUNTER — Other Ambulatory Visit: Payer: Self-pay

## 2018-10-23 ENCOUNTER — Encounter: Payer: Self-pay | Admitting: Internal Medicine

## 2018-10-23 ENCOUNTER — Ambulatory Visit (INDEPENDENT_AMBULATORY_CARE_PROVIDER_SITE_OTHER): Payer: Medicare Other | Admitting: Internal Medicine

## 2018-10-23 VITALS — BP 116/60 | HR 101 | Temp 98.4°F | Ht 63.0 in | Wt 293.0 lb

## 2018-10-23 DIAGNOSIS — R911 Solitary pulmonary nodule: Secondary | ICD-10-CM

## 2018-10-23 DIAGNOSIS — G4733 Obstructive sleep apnea (adult) (pediatric): Secondary | ICD-10-CM | POA: Insufficient documentation

## 2018-10-23 DIAGNOSIS — J45909 Unspecified asthma, uncomplicated: Secondary | ICD-10-CM | POA: Insufficient documentation

## 2018-10-23 DIAGNOSIS — R062 Wheezing: Secondary | ICD-10-CM

## 2018-10-23 DIAGNOSIS — Z6841 Body Mass Index (BMI) 40.0 and over, adult: Secondary | ICD-10-CM

## 2018-10-23 IMAGING — DX CHEST - 2 VIEW
2 series · 2 of 2 positions shown · non-contrast
Comparison: Chest radiograph May 29, 2017 and chest CT December 16, 2017

CLINICAL DATA: Breast carcinoma.  Known lung nodule

EXAM:
CHEST - 2 VIEW

[chest pa]
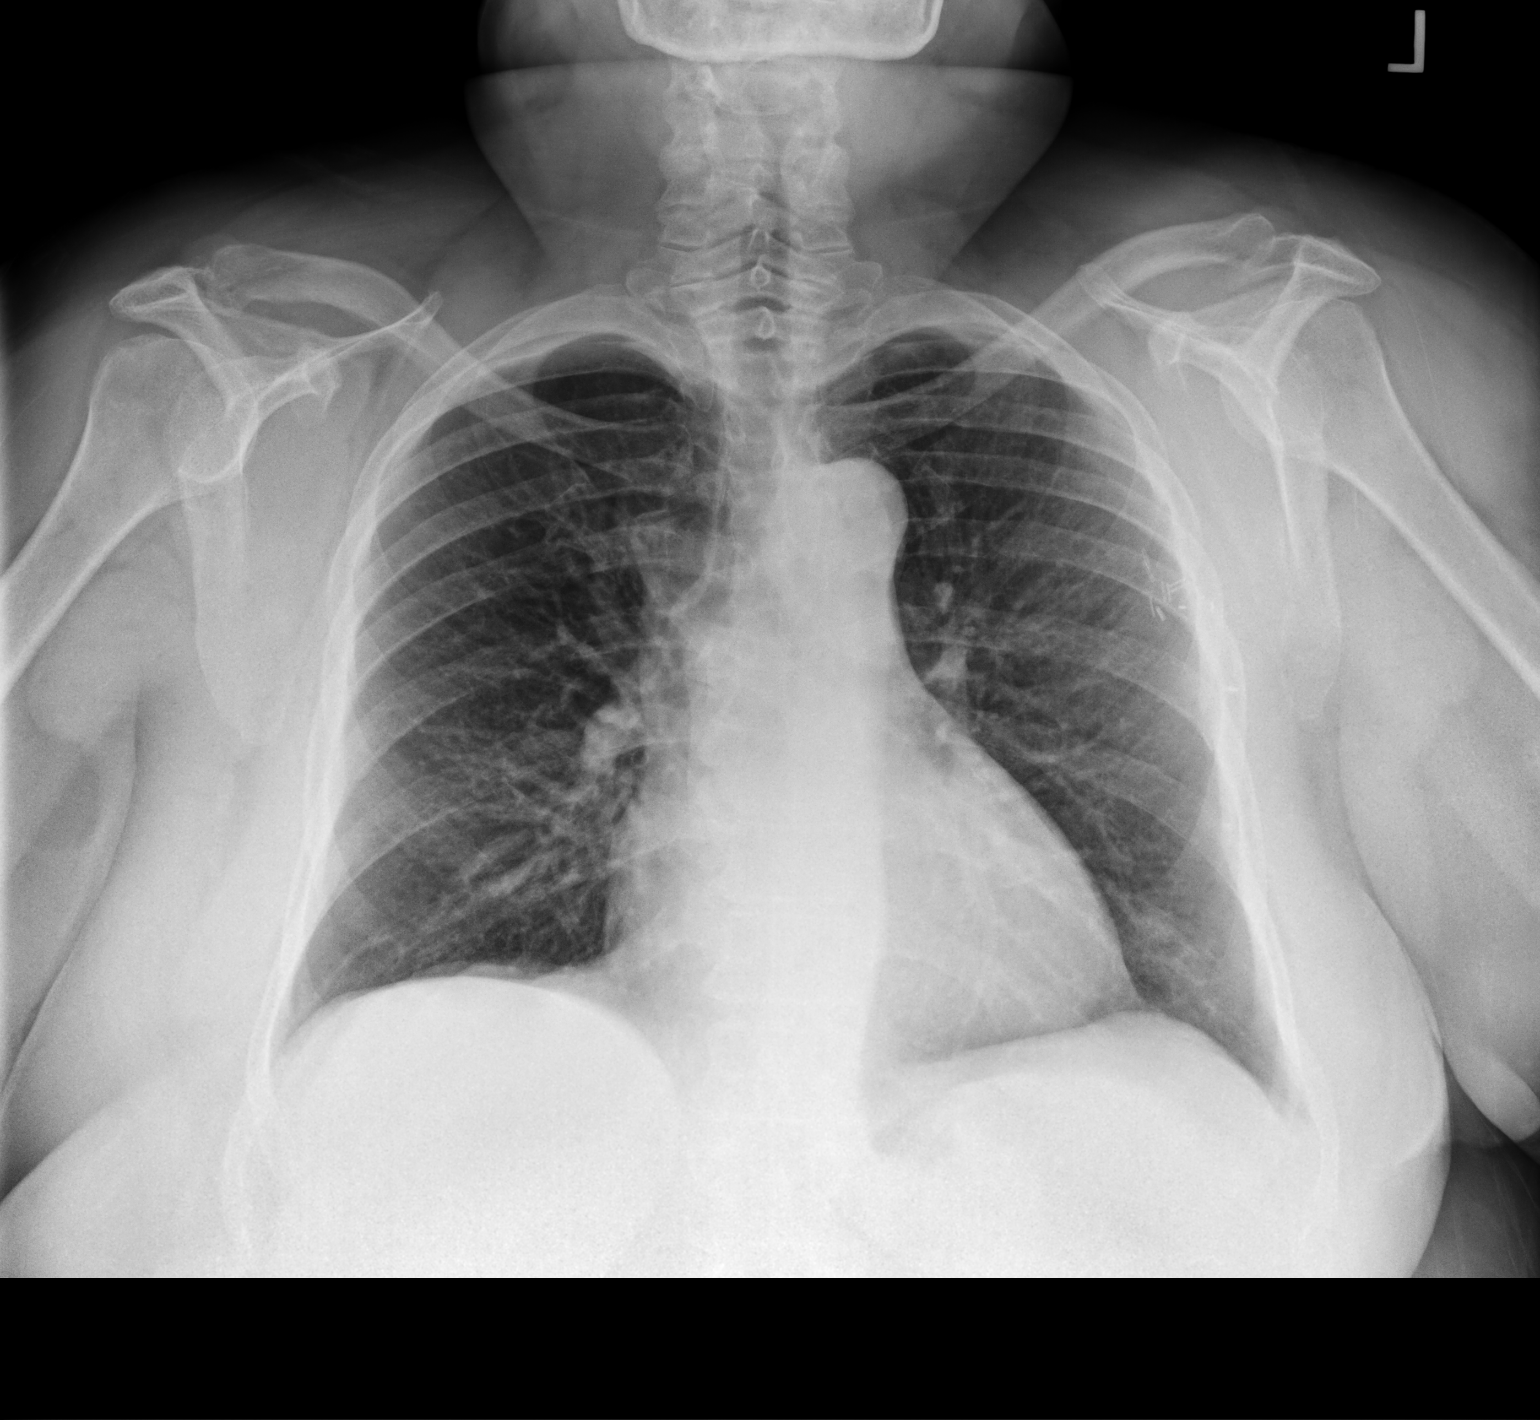

[chest lat]
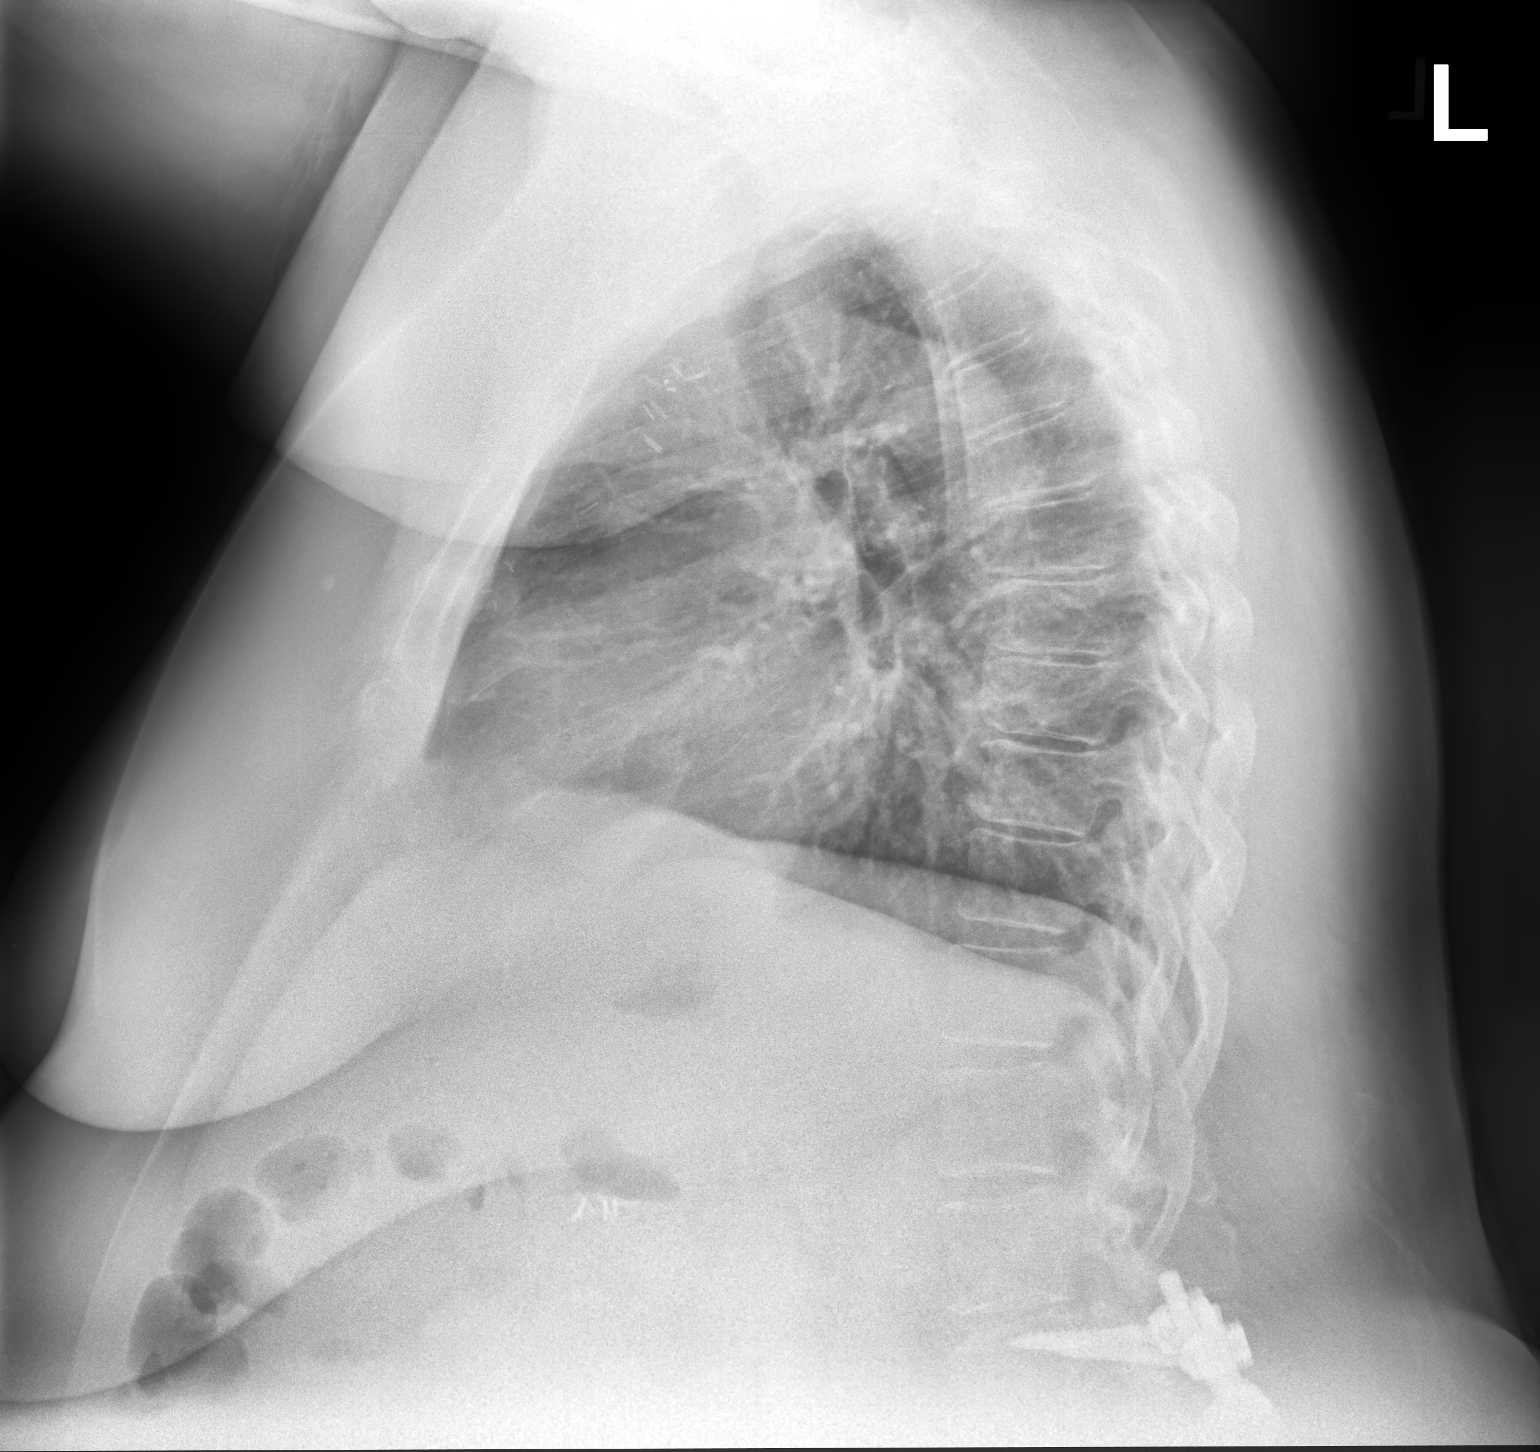

[2 of 2 positions shown; findings below may reference images not displayed]

FINDINGS: The 8 mm nodular opacity seen previously in the anterior left lung
base is not appreciable by radiography. There is no edema or
consolidation. Heart size and pulmonary vascularity are normal. No
adenopathy. Surgical clips are noted in the left axillary region. No
bone lesions.
IMPRESSION: No edema or consolidation.  No adenopathy.

Note that the previously noted 8 mm nodular opacity seen on prior CT
is not appreciable by radiography. It may be prudent to correlate
with noncontrast enhanced chest CT to assess for stability of this
area given history of breast carcinoma.

## 2018-10-23 MED ORDER — UMECLIDINIUM-VILANTEROL 62.5-25 MCG/INH IN AEPB: 1.0000 | INHALATION_SPRAY | Freq: Every day | RESPIRATORY_TRACT | 0 refills | Status: DC

## 2018-10-23 NOTE — Progress Notes (Signed)
10/23/2018- 1 yoF never smoker for pulmonary evaluation with hx of wheezing dyspnea and obstructive sleep apnea. Lives w daughter. -----referred by Dr. Alphonzo Grieve (PCP) for sleep apnea; split night study 07/23/2016; formely on CPAP, however, states her insurance withdrew it d/t lack of usage; pt is not currently on CPAP, states she is restless and "up and down" Medical problem list includes DM2, hx Breast cancer L in remission, HBP She had previously declined use of an inhaler because she didn't like the taste.  NPSG 07/23/2016- [facility] 116.6/ hr, desaturation to 72% CT chest ordered 1o/10/19 for lung nodules w concern mets after breast cancer. Never done. CT chest 12/16/17- Dr Jana Hakim IMPRESSION: 1. Previously noted nodules in the left lung are stable compared to the prior study. This is reassuring, however, close attention on follow-up studies is recommended to ensure continued stability. Repeat noncontrast chest CT is recommended in 6-9 months to ensure continued stability. 2. Mild cardiomegaly. Body weight today 293 lbs, room air O2 sat 97% at rest Feels squirmy without pain or awareness of dyspnea lying in bed. Daytime sleepiness, dozes. Snores. Naps many days.  ENT+ tonsils.Not aware of lung disease but hears some wheeze with exertion. Tried an inhaler but didn't like the taste. Frequent dry cough- not aware at night.  Seasonal allergic rhinitis - not  Bad. Easy DOE w ADLs, but not recent acute exacerbation. One remote pneumonia.  Had L lumpectomy and XRT. Aware of lung nodule, for f/u CT per Dr Jana Hakim.   Prior to Admission medications   Medication Sig Start Date End Date Taking? Authorizing Provider  benzonatate (TESSALON) 100 MG capsule Take 100 mg by mouth 4 (four) times daily as needed for cough.    Yes [provider]  cyclobenzaprine (FLEXERIL) 5 MG tablet TAKE 1 TABLET(5 MG) BY MOUTH TWICE DAILY AS NEEDED FOR MUSCLE SPASMS 08/26/18  Yes Bayard Hugger, NP  Dexlansoprazole  (DEXILANT) 30 MG capsule Take 30 mg by mouth daily.   Yes [provider]  DULoxetine (CYMBALTA) 60 MG capsule Take 60 mg by mouth daily.  06/28/16  Yes [provider]  ezetimibe (ZETIA) 10 MG tablet Take 10 mg by mouth daily.   Yes [provider]  HYDROcodone-acetaminophen (NORCO) 7.5-325 MG tablet Take 0.5 to 1 tablet every 6 hours as needed 10/21/18  Yes Kirsteins, Luanna Salk, MD  Insulin Degludec (TRESIBA FLEXTOUCH [place]) Inject into the skin.   Yes [provider]  levocetirizine (XYZAL) 5 MG tablet Take 5 mg by mouth daily.   Yes [provider]  metFORMIN (GLUCOPHAGE) 850 MG tablet Take 850 mg by mouth 2 (two) times daily with a meal.   Yes [provider]  montelukast (SINGULAIR) 10 MG tablet Take 10 mg by mouth at bedtime.   Yes [provider]  ondansetron (ZOFRAN) 4 MG tablet Take 4 mg by mouth every 8 (eight) hours as needed for nausea or vomiting.   Yes [provider]  potassium chloride (KLOR-CON) 20 MEQ packet Take 20 mEq by mouth daily.   Yes [provider]  pregabalin (LYRICA) 100 MG capsule TAKE 1 CAPSULE(100 MG) BY MOUTH THREE TIMES DAILY 09/08/18  Yes Kirsteins, Luanna Salk, MD  Valbenazine Tosylate [facility]) 40 MG CAPS Take 40 mg by mouth daily.   Yes [provider]  umeclidinium-vilanterol (ANORO ELLIPTA) 62.5-25 MCG/INH AEPB Inhale 1 puff into the lungs daily. 10/23/18   Deneise Lever, MD   Past Medical History:  Diagnosis Date  . Anemia   .  Anxiety   . Arthritis   . Breast cancer ([place]) 2010   T3N1 invasive ductal carcinoma left breast.Takes Arimidex daily  . Bursitis   . Carpal tunnel syndrome   . Chronic back pain    stenosis  . Constipation    takes Colace daily  . Depression    takes Benzotropine daily  . Diverticulitis of colon   . Dyspnea    daily when walking for over 1 yr.  . Fibromyalgia 08/2012  . GERD (gastroesophageal reflux disease)    takes Dexilant daily  .  Hemorrhoid   . History of blood transfusion    no abnormal reaction noted  . History of colon polyps    benign  . History of shingles   . Joint pain   . Joint swelling   . Night muscle spasms    takes Flexeril nightly as needed  . Nocturia   . OSA (obstructive sleep apnea)   . OSA on CPAP   . Peripheral edema    takes Furosemide.Just started 01/18/16  . Peripheral neuropathy    takes Lyrica daily  . Personal history of chemotherapy    2013  . Personal history of radiation therapy    2013  . Pneumonia    hx of-2015  . Pseudoarthrosis of lumbar spine   . Seasonal allergies    takes Singulair nightly  . SOB (shortness of breath) on exertion    rarely with exertion  . 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 Alphonzo Grieve)   Past Surgical History:  Procedure Laterality Date  . ABDOMINAL HYSTERECTOMY     still has ovaries  . APPENDECTOMY    . AXILLARY LYMPH NODE DISSECTION  11/28/2011   Procedure: AXILLARY LYMPH NODE DISSECTION;  Surgeon: Edward Jolly, MD;  Location: [place];  Service: General;  Laterality: Left;  . BREAST LUMPECTOMY Left 11/28/2011   Malignant  . BREAST SURGERY Left 2013  . CARPAL TUNNEL RELEASE     Bilateral  . CESAREAN SECTION     pt. has had 3  . CHOLECYSTECTOMY    . COLONOSCOPY    . EYE SURGERY Bilateral    cataract removal  . KNEE SURGERY     Left Knee  . LAMINECTOMY WITH POSTERIOR LATERAL ARTHRODESIS LEVEL 1 N/A 11/29/2017   Procedure: Posterior Lateral Fusion - Lumbar Four-Lumbar Five, removal and replacement of hardware, Laminectomy - Lumbar Four-Lumbar Five;  Surgeon: Eustace Moore, MD;  Location: [facility] OR;  Service: Neurosurgery;  Laterality: N/A;  . LAMINECTOMY WITH POSTERIOR LATERAL ARTHRODESIS LEVEL 2 N/A 06/07/2017   Procedure: Posterior Lateral Fusion Lumbar Three-Four and Transforaminal Interbody Fusion Lumbar Four-Five with  Segmental  Pedicle Screw Fixation;  Surgeon: Eustace Moore, MD;  Location: [place];  Service: Neurosurgery;  Laterality: N/A;  Posterior Lateral Fusion Lumbar Three-Four and Transforaminal Interbody Fusion Lumbar Four-Five with Segmental  Pedicle Screw Fixation   . LUMBAR FUSION  06/07/2017   POST  . LUMBAR LAMINECTOMY/DECOMPRESSION MICRODISCECTOMY Bilateral 01/27/2016   Procedure: Laminectomy and Foraminotomy - Lumbar four -Lumbar five - bilateral- on-lay noninstrumented fusion;  Surgeon: Eustace Moore, MD;  Location: [place] [facility];  Service: Neurosurgery;  Laterality: Bilateral;  . MULTIPLE EXTRACTIONS WITH ALVEOLOPLASTY N/A 05/11/2016   Procedure: EXTRACTION OF TEETH EIGHTEEN, TWENTY AND TWENTY- NINE;  REMOVAL OF MANDIBULAR TORUS AND EXOSTOSIS;  Surgeon: Diona Browner, DDS;  Location: [place];  Service: Oral  Surgery;  Laterality: N/A;  . PORTACATH PLACEMENT  06/18/2011   Procedure: INSERTION PORT-A-CATH;  Surgeon: Edward Jolly, MD;  Location: [place];  Service: General;  Laterality: Right;  right subclavian  . removal portacath  2014  . spur     Apex spur on both big toes  . TOE SURGERY Bilateral   . TONSILLECTOMY    . TOTAL KNEE ARTHROPLASTY Left 09/13/2014  . TOTAL KNEE ARTHROPLASTY Left 09/13/2014   Procedure: LEFT TOTAL KNEE ARTHROPLASTY;  Surgeon: Rod Can, MD;  Location: [place];  Service: Orthopedics;  Laterality: Left;  . TOTAL KNEE ARTHROPLASTY Right 03/10/2015   Procedure: RIGHT TOTAL KNEE ARTHROPLASTY;  Surgeon: Rod Can, MD;  Location: [facility];  Service: Orthopedics;  Laterality: Right;   Family History  Problem Relation Age of Onset  . Hypertension Mother   . Diabetes Mother   . Hypertension Father   . Diabetes Father   . Cancer Paternal Grandmother        unknown  . Colon cancer Neg Hx    Social History   Socioeconomic History  . Marital status: Single    Spouse name: Not on file  . Number of children: 3  . Years of education: Not on file   . Highest education level: Not on file  Occupational History  . Not on file  Social Needs  . Financial resource strain: Not on file  . Food insecurity:    Worry: Not on file    Inability: Not on file  . Transportation needs:    Medical: Not on file    Non-medical: Not on file  Tobacco Use  . Smoking status: Never Smoker  . Smokeless tobacco: Never Used  Substance and Sexual Activity  . Alcohol use: No    Alcohol/week: 0.0 standard drinks  . Drug use: No  . Sexual activity: Never    Birth control/protection: Post-menopausal, Surgical  Lifestyle  . Physical activity:    Days per week: Not on file    Minutes per session: Not on file  . Stress: Not on file  Relationships  . Social connections:    Talks on phone: Not on file    Gets together: Not on file    Attends religious service: Not on file    Active member of club or organization: Not on file    Attends meetings of clubs or organizations: Not on file    Relationship status: Not on file  . Intimate partner violence:    Fear of current or ex partner: Not on file    Emotionally abused: Not on file    Physically abused: Not on file    Forced sexual activity: Not on file  Other Topics Concern  . Not on file  Social History Narrative  . Not on file   ROS-see HPI   + = positive Constitutional:    weight loss, night sweats, fevers, chills, fatigue, lassitude. HEENT:    headaches, +difficulty swallowing, +tooth/dental problems, sore throat,       +sneezing,+ itching,+ ear ache, nasal congestion, post nasal drip, snoring CV:    chest pain, orthopnea, PND, +swelling in lower extremities, anasarca,                                  dizziness, palpitations Resp:   +shortness of breath with exertion or at rest.  productive cough,   non-productive cough, coughing up of blood.              change in color of mucus.  wheezing.   Skin:    rash or lesions. GI:  No-   heartburn, indigestion, +abdominal pain, nausea,  vomiting, diarrhea,                 change in bowel habits, loss of appetite GU: dysuria, change in color of urine, no urgency or frequency.   flank pain. MS:   +joint pain, stiffness, decreased range of motion, back pain. Neuro-     nothing unusual Psych:  change in mood or affect.  depression or +anxiety.   memory loss.  OBJ- Physical Exam General- Alert, Oriented, Affect-appropriate, Distress- none acute, + morbidly obese Skin- rash-none, lesions- none, excoriation- none Lymphadenopathy- none Head- atraumatic            Eyes- Gross vision intact, PERRLA, conjunctivae and secretions clear            Ears- Hearing, canals-normal            Nose- Clear, no-Septal dev, mucus, polyps, erosion, perforation             Throat- Mallampati III , mucosa clear , drainage- none, tonsils- atrophic, + teeth Neck- flexible , trachea midline, no stridor , thyroid nl, carotid no bruit Chest - symmetrical excursion , unlabored           Heart/CV- RRR , no murmur , no gallop  , no rub, nl s1 s2                           - JVD- none , edema- none, stasis changes- none, varices- none           Lung- clear to P&A, wheeze- none, cough- none , dullness-none, rub- none           Chest wall- +Scar from port access Abd-  Br/ Gen/ Rectal- Not done, not indicated Extrem- cyanosis- none, clubbing, none, atrophy- none, strength- nl Neuro- grossly intact to observation

## 2018-10-23 NOTE — Assessment & Plan Note (Signed)
CPAP removed by insurance due to noncompliance. We have begun educational efforts.  Plan- update with new sleep study. Anticipate she will qualify for new trial of CPAP with our efforts to motivate compliance.

## 2018-10-23 NOTE — Assessment & Plan Note (Signed)
Pending f/u CT chest later this year. As part of evaluating wheezing dyspnea we will order CXR now for interval evaluation.

## 2018-10-23 NOTE — Patient Instructions (Signed)
Order- CXR   Dx lung nodule, hx L breast cancer XRT,   Order- schedule PFT  Order- schedule unattended home sleep test   Dx OSA  Sample Anoro inhaler    Inhale 1 puff, once daily. Use this every day while the sample lasts, and see if it helps your breathing.

## 2018-10-23 NOTE — Assessment & Plan Note (Signed)
We need a full PFT when that can be rescheduled, recognizing Covid rules.  Obesity alone might cause this. Uncertain if she might have any radiation fibrosis. Does not seem to be in heart failure. Plan- encouraged her to try sample Anoro to see how it does. Schedule PFT

## 2018-10-23 NOTE — Assessment & Plan Note (Signed)
She is seriously overweight.Consider Bariatric referral. Highly unlikely that she will be able to lose this weight on her own.

## 2018-10-24 ENCOUNTER — Telehealth: Payer: Self-pay | Admitting: Internal Medicine

## 2018-10-24 NOTE — Telephone Encounter (Signed)
Notes recorded by Deneise Lever, MD on 10/23/2018 at 8:34 PM EDT CXR is normal except for old surgical changes. The lung nodule is too small to be see on this image. Keep plans for CT scan later this year as ordered by your Oncologist.       Spoke with pt and notified of results per Dr. Annamaria Boots. Pt verbalized understanding and denied any questions.

## 2018-10-24 NOTE — Progress Notes (Signed)
Spoke with pt and notified of results per Dr. Young Pt verbalized understanding and denied any questions. 

## 2018-11-07 ENCOUNTER — Other Ambulatory Visit: Payer: Self-pay

## 2018-11-07 ENCOUNTER — Ambulatory Visit: Payer: Medicare Other

## 2018-11-07 DIAGNOSIS — G4733 Obstructive sleep apnea (adult) (pediatric): Secondary | ICD-10-CM | POA: Diagnosis not present

## 2018-11-11 DIAGNOSIS — G4733 Obstructive sleep apnea (adult) (pediatric): Secondary | ICD-10-CM

## 2018-11-18 ENCOUNTER — Other Ambulatory Visit: Payer: Self-pay

## 2018-11-18 ENCOUNTER — Encounter: Payer: Self-pay | Admitting: Registered Nurse

## 2018-11-18 ENCOUNTER — Encounter: Payer: Medicare Other | Attending: Physical Medicine & Rehabilitation | Admitting: Registered Nurse

## 2018-11-18 VITALS — BP 128/85 | HR 93 | Temp 97.6°F | Resp 14 | Ht 63.0 in | Wt 291.0 lb

## 2018-11-18 DIAGNOSIS — M797 Fibromyalgia: Secondary | ICD-10-CM

## 2018-11-18 DIAGNOSIS — Z981 Arthrodesis status: Secondary | ICD-10-CM | POA: Diagnosis not present

## 2018-11-18 DIAGNOSIS — G8918 Other acute postprocedural pain: Secondary | ICD-10-CM | POA: Diagnosis not present

## 2018-11-18 DIAGNOSIS — M1711 Unilateral primary osteoarthritis, right knee: Secondary | ICD-10-CM

## 2018-11-18 DIAGNOSIS — Z9889 Other specified postprocedural states: Secondary | ICD-10-CM | POA: Diagnosis not present

## 2018-11-18 DIAGNOSIS — G8929 Other chronic pain: Secondary | ICD-10-CM | POA: Insufficient documentation

## 2018-11-18 DIAGNOSIS — M5416 Radiculopathy, lumbar region: Secondary | ICD-10-CM

## 2018-11-18 DIAGNOSIS — Z853 Personal history of malignant neoplasm of breast: Secondary | ICD-10-CM | POA: Diagnosis not present

## 2018-11-18 DIAGNOSIS — M545 Low back pain: Secondary | ICD-10-CM | POA: Diagnosis present

## 2018-11-18 DIAGNOSIS — M48061 Spinal stenosis, lumbar region without neurogenic claudication: Secondary | ICD-10-CM | POA: Insufficient documentation

## 2018-11-18 DIAGNOSIS — M62838 Other muscle spasm: Secondary | ICD-10-CM

## 2018-11-18 DIAGNOSIS — F329 Major depressive disorder, single episode, unspecified: Secondary | ICD-10-CM | POA: Insufficient documentation

## 2018-11-18 DIAGNOSIS — G894 Chronic pain syndrome: Secondary | ICD-10-CM

## 2018-11-18 DIAGNOSIS — Z5181 Encounter for therapeutic drug level monitoring: Secondary | ICD-10-CM

## 2018-11-18 DIAGNOSIS — M961 Postlaminectomy syndrome, not elsewhere classified: Secondary | ICD-10-CM

## 2018-11-18 DIAGNOSIS — G4733 Obstructive sleep apnea (adult) (pediatric): Secondary | ICD-10-CM | POA: Insufficient documentation

## 2018-11-18 DIAGNOSIS — M1712 Unilateral primary osteoarthritis, left knee: Secondary | ICD-10-CM

## 2018-11-18 DIAGNOSIS — K219 Gastro-esophageal reflux disease without esophagitis: Secondary | ICD-10-CM | POA: Insufficient documentation

## 2018-11-18 DIAGNOSIS — Z79891 Long term (current) use of opiate analgesic: Secondary | ICD-10-CM

## 2018-11-18 MED ORDER — CYCLOBENZAPRINE HCL 5 MG PO TABS: ORAL_TABLET | ORAL | 3 refills | Status: DC

## 2018-11-18 NOTE — Progress Notes (Signed)
Subjective:    Patient ID: Kelsey Wilson, female    DOB: 01/12/1961, 58 y.o.   MRN: [id number]  HPI: Kelsey Wilson is a 58 y.o. female who returns for follow up appointment for chronic pain and medication refill. She states her pain is located in  her lower back radiating into her left buttock and bilateral knee pain. She rates her pain 7. Her current exercise regime is walking, she was encouraged to increase her HEPas tolerated. She verbalizes understanding.   Kelsey Wilson Morphine equivalent is 30.00MME.  Last Oral Swab was Performed on 06/06/2018, it was consistent.    Pain Inventory Average Pain 7 Pain Right Now 7 My pain is aching  In the last 24 hours, has pain interfered with the following? General activity 6 Relation with others 6 Enjoyment of life 6 What TIME of day is your pain at its worst? daytime Sleep (in general) Fair  Pain is worse with: walking, bending, sitting, inactivity and standing Pain improves with: rest Relief from Meds: 3  Mobility ability to climb steps?  yes  Function retired I need assistance with the following:  meal prep and household duties  Neuro/Psych numbness tremor spasms anxiety  Prior Studies n/a  Physicians involved in your care n/a   Family History  Problem Relation Age of Onset  . Hypertension Mother   . Diabetes Mother   . Hypertension Father   . Diabetes Father   . Cancer Paternal Grandmother        unknown  . Colon cancer Neg Hx    Social History   Socioeconomic History  . Marital status: Single    Spouse name: Not on file  . Number of children: 3  . Years of education: Not on file  . Highest education level: Not on file  Occupational History  . Not on file  Social Needs  . Financial resource strain: Not on file  . Food insecurity    Worry: Not on file    Inability: Not on file  . Transportation needs    Medical: Not on file    Non-medical: Not on file  Tobacco Use  . Smoking status:  Never Smoker  . Smokeless tobacco: Never Used  Substance and Sexual Activity  . Alcohol use: No    Alcohol/week: 0.0 standard drinks  . Drug use: No  . Sexual activity: Never    Birth control/protection: Post-menopausal, Surgical  Lifestyle  . Physical activity    Days per week: Not on file    Minutes per session: Not on file  . Stress: Not on file  Relationships  . Social Herbalist on phone: Not on file    Gets together: Not on file    Attends religious service: Not on file    Active member of club or organization: Not on file    Attends meetings of clubs or organizations: Not on file    Relationship status: Not on file  Other Topics Concern  . Not on file  Social History Narrative  . Not on file   Past Surgical History:  Procedure Laterality Date  . ABDOMINAL HYSTERECTOMY     still has ovaries  . APPENDECTOMY    . AXILLARY LYMPH NODE DISSECTION  11/28/2011   Procedure: AXILLARY LYMPH NODE DISSECTION;  Surgeon: Edward Jolly, MD;  Location: [place];  Service: General;  Laterality: Left;  . BREAST LUMPECTOMY Left 11/28/2011   Malignant  . BREAST SURGERY Left 2013  .  CARPAL TUNNEL RELEASE     Bilateral  . CESAREAN SECTION     pt. has had 3  . CHOLECYSTECTOMY    . COLONOSCOPY    . EYE SURGERY Bilateral    cataract removal  . KNEE SURGERY     Left Knee  . LAMINECTOMY WITH POSTERIOR LATERAL ARTHRODESIS LEVEL 1 N/A 11/29/2017   Procedure: Posterior Lateral Fusion - Lumbar Four-Lumbar Five, removal and replacement of hardware, Laminectomy - Lumbar Four-Lumbar Five;  Surgeon: Eustace Moore, MD;  Location: [facility] OR;  Service: Neurosurgery;  Laterality: N/A;  . LAMINECTOMY WITH POSTERIOR LATERAL ARTHRODESIS LEVEL 2 N/A 06/07/2017   Procedure: Posterior Lateral Fusion Lumbar Three-Four and Transforaminal Interbody Fusion Lumbar Four-Five with Segmental  Pedicle Screw Fixation;  Surgeon: Eustace Moore, MD;  Location: [place];  Service: Neurosurgery;  Laterality: N/A;   Posterior Lateral Fusion Lumbar Three-Four and Transforaminal Interbody Fusion Lumbar Four-Five with Segmental  Pedicle Screw Fixation   . LUMBAR FUSION  06/07/2017   POST  . LUMBAR LAMINECTOMY/DECOMPRESSION MICRODISCECTOMY Bilateral 01/27/2016   Procedure: Laminectomy and Foraminotomy - Lumbar four -Lumbar five - bilateral- on-lay noninstrumented fusion;  Surgeon: Eustace Moore, MD;  Location: [place] [facility];  Service: Neurosurgery;  Laterality: Bilateral;  . MULTIPLE EXTRACTIONS WITH ALVEOLOPLASTY N/A 05/11/2016   Procedure: EXTRACTION OF TEETH EIGHTEEN, TWENTY AND TWENTY- NINE;  REMOVAL OF MANDIBULAR TORUS AND EXOSTOSIS;  Surgeon: Diona Browner, DDS;  Location: [place];  Service: Oral Surgery;  Laterality: N/A;  . PORTACATH PLACEMENT  06/18/2011   Procedure: INSERTION PORT-A-CATH;  Surgeon: Edward Jolly, MD;  Location: [place];  Service: General;  Laterality: Right;  right subclavian  . removal portacath  2014  . spur     Apex spur on both big toes  . TOE SURGERY Bilateral   . TONSILLECTOMY    . TOTAL KNEE ARTHROPLASTY Left 09/13/2014  . TOTAL KNEE ARTHROPLASTY Left 09/13/2014   Procedure: LEFT TOTAL KNEE ARTHROPLASTY;  Surgeon: Rod Can, MD;  Location: [place];  Service: Orthopedics;  Laterality: Left;  . TOTAL KNEE ARTHROPLASTY Right 03/10/2015   Procedure: RIGHT TOTAL KNEE ARTHROPLASTY;  Surgeon: Rod Can, MD;  Location: [facility];  Service: Orthopedics;  Laterality: Right;   Past Medical History:  Diagnosis Date  . Anemia   . Anxiety   . Arthritis   . Breast cancer ([place]) 2010   T3N1 invasive ductal carcinoma left breast.Takes Arimidex daily  . Bursitis   . Carpal tunnel syndrome   . Chronic back pain    stenosis  . Constipation    takes Colace daily  . Depression    takes Benzotropine daily  . Diverticulitis of colon   . Dyspnea    daily when walking for over 1 yr.  . Fibromyalgia 08/2012  . GERD (gastroesophageal reflux disease)    takes Dexilant  daily  . Hemorrhoid   . History of blood transfusion    no abnormal reaction noted  . History of colon polyps    benign  . History of shingles   . Joint pain   . Joint swelling   . Night muscle spasms    takes Flexeril nightly as needed  . Nocturia   . OSA (obstructive sleep apnea)   . OSA on CPAP   . Peripheral edema    takes Furosemide.Just started 01/18/16  . Peripheral neuropathy    takes Lyrica daily  . Personal history of chemotherapy    2013  . Personal history of  radiation therapy    2013  . Pneumonia    hx of-2015  . Pseudoarthrosis of lumbar spine   . Seasonal allergies    takes Singulair nightly  . SOB (shortness of breath) on exertion    rarely with exertion  . 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 Alphonzo Grieve)   There were no vitals taken for this visit.  Opioid Risk Score:   Fall Risk Score:  `1  Depression screen PHQ 2/9  Depression screen [facility] 2/9 08/04/2018 06/06/2018 04/04/2018 01/13/2018 12/13/2017 06/21/2017 03/07/2017  Decreased Interest 0 0 0 3 3 1 2   Down, Depressed, Hopeless 0 0 0 3 3 1 2   PHQ - 2 Score 0 0 0 6 6 2 4   Altered sleeping - - - - - - 0  Tired, decreased energy - - - - - - 3  Change in appetite - - - - - - 3  Feeling bad or failure about yourself  - - - - - - 2  Trouble concentrating - - - - - - 3  Moving slowly or fidgety/restless - - - - - - 3  Suicidal thoughts - - - - - - 0  PHQ-9 Score - - - - - - 18  Difficult doing work/chores - - - - - - Extremely dIfficult  Some recent data might be hidden       Review of Systems     Objective:   Physical Exam Constitutional:      Appearance: Normal appearance. She is obese.  Neck:     Musculoskeletal: Normal range of motion and neck supple.  Cardiovascular:     Rate and Rhythm: Normal rate and regular rhythm.     Pulses: Normal pulses.     Heart sounds: Normal heart  sounds.  Pulmonary:     Effort: Pulmonary effort is normal.     Breath sounds: Normal breath sounds.  Musculoskeletal:     Comments: Normal Muscle Bulk and Muscle Testing Reveals:  Upper Extremities: Decreased ROM 90 Degrees  and Muscle Strength 4/5 Lumbar Paraspinal Tenderness: L-4-L-5  Lower Extremities: Full ROM and Muscle Strength 5/5 Arises from chair slowly Antalgic  Gait   Skin:    General: Skin is warm and dry.  Neurological:     Mental Status: She is alert and oriented to person, place, and time.           Assessment & Plan:  1. Lumbar Postlaminectomy/ S/P Lumbar Fusion:Spinal Stenosis: S/PPosterior Lateral Fusion L-4-L-5 removal and replacement of hardware, Laminectomy L4-L5 by Dr. Ronnald Ramp on 11/29/2017.  Continue HEP as Tolerated and Continue current medication regime.Continue:Hydrocodone7.5/325 mg one tablet every 6 hours as needed#120.11/18/2018 2. Lumbar Radiculitis: Continue current medication regime with Lyrica.11/18/2018 3. Fibromyalgia:Encouraged to Increase HEP as Tolerated.Continue Current Medication Regimen with Lyrica.11/18/2018 4. Right Greater Trochanter Bursitis:No complaints today.Continue to Alternate Ice and Heat Therapy.11/18/2018. 5. Muscle Spasm:Continue:Flexeril..Continue to Monitor.11/18/2018. 6. Chronic Pain Syndrome: Continue Current medication regimen and HEP as Tolerated. Continue to Monitor.11/18/2018. 7. Bilateral OA of Bilateral Knees: Orthopedist following.Continue to Monitor.11/18/2018. 8. Bilateral Hand Pain:No complaints today.Continue to Monitor/ ? OA: Continue to Monitor.11/18/2018 9. Cervicalgia:No complaints today.Continue to alternate Heat/Ice Therapy. Continue HEP as Tolerated.11/18/2018. 10. Chronic Bilateral Shoulders:No complaints Today.Continue to alternate Ice/ Heat therapy and HEP as Tolerated.11/18/2018. 11. Left Lumbar Radiculitis: Continue Lyrica.  15 minutes of Face to Face Patient Care time  was spent  during this visit. All questions encouraged and answered,   F/U in 1 month

## 2018-12-08 ENCOUNTER — Telehealth: Payer: Self-pay | Admitting: Registered Nurse

## 2018-12-08 ENCOUNTER — Ambulatory Visit: Payer: Medicare Other | Admitting: Physical Medicine & Rehabilitation

## 2018-12-08 MED ORDER — HYDROCODONE-ACETAMINOPHEN 7.5-325 MG PO TABS: ORAL_TABLET | ORAL | 0 refills | Status: DC

## 2018-12-08 NOTE — Telephone Encounter (Signed)
Hydrocodone e-scribed. Last fill date 11/05/2018.

## 2018-12-12 ENCOUNTER — Other Ambulatory Visit: Payer: Self-pay

## 2018-12-12 ENCOUNTER — Ambulatory Visit (INDEPENDENT_AMBULATORY_CARE_PROVIDER_SITE_OTHER): Payer: Medicare Other | Admitting: Podiatry

## 2018-12-12 ENCOUNTER — Ambulatory Visit (INDEPENDENT_AMBULATORY_CARE_PROVIDER_SITE_OTHER): Payer: Medicare Other

## 2018-12-12 ENCOUNTER — Other Ambulatory Visit: Payer: Self-pay | Admitting: Podiatry

## 2018-12-12 ENCOUNTER — Encounter: Payer: Self-pay | Admitting: Podiatry

## 2018-12-12 DIAGNOSIS — M19071 Primary osteoarthritis, right ankle and foot: Secondary | ICD-10-CM

## 2018-12-12 DIAGNOSIS — M7751 Other enthesopathy of right foot: Secondary | ICD-10-CM

## 2018-12-12 DIAGNOSIS — M6281 Muscle weakness (generalized): Secondary | ICD-10-CM | POA: Diagnosis not present

## 2018-12-12 DIAGNOSIS — M19079 Primary osteoarthritis, unspecified ankle and foot: Secondary | ICD-10-CM | POA: Diagnosis not present

## 2018-12-12 MED ORDER — MELOXICAM 7.5 MG PO TABS: 7.5000 mg | ORAL_TABLET | Freq: Every day | ORAL | 0 refills | Status: DC

## 2018-12-14 NOTE — Progress Notes (Signed)
This patient presents to the office with chief complaint of right ankle/foot pain for 2-3 months.  She says she has severe pain walking through her right ankle.  She says the ankle frequently swells.  She has no history of trauma or minjury.  Patient has provided no self treatment.  She presents to the office for an evaluation of her right ankle.  General Appearance  Alert, conversant and in no acute stress.  Vascular  Dorsalis pedis are weakly  palpable  bilaterally.   Posterior tibial pulses are absent  B/L due to swelling.   Capillary return is within normal limits  bilaterally. Temperature is within normal limits  bilaterally.  Neurologic  Senn-Weinstein monofilament wire test within normal limits  bilaterally. Muscle power right ankle is 0/4.  Muslce power left ankle  Is 1/4.  Nails Thick disfigured discolored nails with subungual debris  from hallux to fifth toes bilaterally. No evidence of bacterial infection or drainage bilaterally.  Orthopedic  No limitations of motion  feet .  No crepitus or effusions noted.  No bony pathology or digital deformities noted.  Patient has swelling noted right ankle greater than left.  No palpable pain with redness noted along ankle tendons  B/L  Palpable pain noted anterior aspect right ankle.  Hallux limitus 1st MPJ  B/L.   Midfoot arthritis.  Pes planus foot type  B/l.  Skin  normotropic skin with no porokeratosis noted bilaterally.  No signs of infections or ulcers noted.   Ankle arthritis right foot  IE.  Xrays taken right foot/ankle.  Arthritis noted 1st MPJ right.  Midfoot arthritis right.  Calcification at the insertion achilles tendon right foot.  Cystic lesion noted on medial aspect talus right ankle and along lateral gutter right ankle.  Discussed this condition with this patient.  Informed this patient there is no evidence of an acute ankle flare-up.  Her pain is chronic in nature with severe right ankle swelling.  She has minimal muscle strength  with muscle testing.  Anterior ankle pain noted at the clinical site of talar cyst.  Patient is diabetic so no steroids were prescribed.  She does have an allergy to naprosyn which causes nausea.  Therefore I prescribe Mobic 7.5  Po. I recommended she be seen by the medical doctors of the practice for a complete evaluation.  Therefore limited treatment was provided to allow the next doctor to see her symptoms.      Gardiner Barefoot DPM

## 2018-12-16 ENCOUNTER — Other Ambulatory Visit: Payer: Self-pay

## 2018-12-16 ENCOUNTER — Ambulatory Visit (INDEPENDENT_AMBULATORY_CARE_PROVIDER_SITE_OTHER): Payer: Medicare Other | Admitting: Podiatry

## 2018-12-16 VITALS — Temp 98.0°F

## 2018-12-16 DIAGNOSIS — M76829 Posterior tibial tendinitis, unspecified leg: Secondary | ICD-10-CM

## 2018-12-16 DIAGNOSIS — M779 Enthesopathy, unspecified: Secondary | ICD-10-CM

## 2018-12-16 MED ORDER — MELOXICAM 7.5 MG PO TABS: 7.5000 mg | ORAL_TABLET | Freq: Every day | ORAL | 0 refills | Status: DC

## 2018-12-23 ENCOUNTER — Encounter: Payer: Medicare Other | Admitting: Registered Nurse

## 2018-12-24 NOTE — Progress Notes (Signed)
Subjective: 58 year old female presents the office today for concerns of right ankle pain.  She said entire ankles been hurting but the meloxicam has been helpful.  She points more to the medial aspect where she is majority of tenderness.  She denies any recent injury or trauma.  She has occasional swelling but no redness or warmth that she reports.  This is been on there for about 3 months. Discomfort gets worse with prolonged activity.  Denies any systemic complaints such as fevers, chills, nausea, vomiting. No acute changes since last appointment, and no other complaints at this time.   Objective: AAO x3, NAD DP/PT pulses palpable bilaterally, CRT less than 3 seconds The majority of tenderness along the medial aspect ankle most along the course of the posterior tibial, flexor tendon.  Tendon appears to be intact.  She has difficulty doing a single heel rise on that side.  There is no area pinpoint tenderness.  No pain or crepitation with ankle joint range of motion.  No pain with Achilles tendon, plantar fascia.  No open lesions or pre-ulcerative lesions.  No pain with calf compression, swelling, warmth, erythema  Assessment: Posterior tibial tendonitis  Plan: -All treatment options discussed with the patient including all alternatives, risks, complications.  -Is a Tri-Lock ankle brace was dispensed.  Continue meloxicam.  Discussed ice to the area daily.  Ultimately discussed shoe modifications and orthotics.  Also consider physical therapy if needed.  I will see her back next 2 to 3 weeks to see how she is doing at that point likely order physical therapy if needed.  If no improvement will order an MRI. -Patient encouraged to call the office with any questions, concerns, change in symptoms.   Trula Slade DPM

## 2018-12-30 ENCOUNTER — Encounter: Payer: Self-pay | Admitting: Registered Nurse

## 2018-12-30 ENCOUNTER — Other Ambulatory Visit: Payer: Self-pay

## 2018-12-30 ENCOUNTER — Encounter: Payer: Medicare Other | Attending: Physical Medicine & Rehabilitation | Admitting: Registered Nurse

## 2018-12-30 VITALS — BP 125/87 | HR 86 | Temp 97.2°F | Resp 12 | Ht 63.0 in | Wt 291.0 lb

## 2018-12-30 DIAGNOSIS — Z853 Personal history of malignant neoplasm of breast: Secondary | ICD-10-CM | POA: Insufficient documentation

## 2018-12-30 DIAGNOSIS — M62838 Other muscle spasm: Secondary | ICD-10-CM

## 2018-12-30 DIAGNOSIS — G8929 Other chronic pain: Secondary | ICD-10-CM | POA: Insufficient documentation

## 2018-12-30 DIAGNOSIS — Z981 Arthrodesis status: Secondary | ICD-10-CM | POA: Diagnosis not present

## 2018-12-30 DIAGNOSIS — Z5181 Encounter for therapeutic drug level monitoring: Secondary | ICD-10-CM

## 2018-12-30 DIAGNOSIS — F329 Major depressive disorder, single episode, unspecified: Secondary | ICD-10-CM | POA: Diagnosis not present

## 2018-12-30 DIAGNOSIS — K219 Gastro-esophageal reflux disease without esophagitis: Secondary | ICD-10-CM | POA: Insufficient documentation

## 2018-12-30 DIAGNOSIS — M48061 Spinal stenosis, lumbar region without neurogenic claudication: Secondary | ICD-10-CM | POA: Insufficient documentation

## 2018-12-30 DIAGNOSIS — Z9889 Other specified postprocedural states: Secondary | ICD-10-CM | POA: Diagnosis not present

## 2018-12-30 DIAGNOSIS — M797 Fibromyalgia: Secondary | ICD-10-CM

## 2018-12-30 DIAGNOSIS — G8918 Other acute postprocedural pain: Secondary | ICD-10-CM | POA: Insufficient documentation

## 2018-12-30 DIAGNOSIS — M1712 Unilateral primary osteoarthritis, left knee: Secondary | ICD-10-CM

## 2018-12-30 DIAGNOSIS — M545 Low back pain: Secondary | ICD-10-CM | POA: Insufficient documentation

## 2018-12-30 DIAGNOSIS — M961 Postlaminectomy syndrome, not elsewhere classified: Secondary | ICD-10-CM

## 2018-12-30 DIAGNOSIS — G4733 Obstructive sleep apnea (adult) (pediatric): Secondary | ICD-10-CM | POA: Diagnosis not present

## 2018-12-30 DIAGNOSIS — M1711 Unilateral primary osteoarthritis, right knee: Secondary | ICD-10-CM

## 2018-12-30 DIAGNOSIS — M5416 Radiculopathy, lumbar region: Secondary | ICD-10-CM

## 2018-12-30 DIAGNOSIS — G894 Chronic pain syndrome: Secondary | ICD-10-CM

## 2018-12-30 MED ORDER — HYDROCODONE-ACETAMINOPHEN 7.5-325 MG PO TABS: 1.0000 | ORAL_TABLET | Freq: Three times a day (TID) | ORAL | 0 refills | Status: DC | PRN

## 2018-12-30 MED ORDER — PREGABALIN 100 MG PO CAPS: ORAL_CAPSULE | ORAL | 5 refills | Status: DC

## 2018-12-30 MED ORDER — HYDROCODONE-ACETAMINOPHEN 7.5-325 MG PO TABS: ORAL_TABLET | ORAL | 0 refills | Status: DC

## 2018-12-30 NOTE — Progress Notes (Signed)
Subjective:    Patient ID: Kelsey Wilson, female    DOB: 1960/08/14, 58 y.o.   MRN: [id number]  HPI: Kelsey Wilson is a 58 y.o. female who returns for follow up appointment for chronic pain and medication refill. She states her pain is located in her lower back radiating into her right buttock and bilateral knee pain. She rates her pain 7. Her current exercise regime is walking.   [name] Morphine equivalent is 30.00  MME.  Last Oral Swab was Performed on 06/06/2018, it was consistent.   Pain Inventory Average Pain 7 Pain Right Now 7 My pain is aching  In the last 24 hours, has pain interfered with the following? General activity 2 Relation with others 3 Enjoyment of life 2 What TIME of day is your pain at its worst? daytime Sleep (in general) Fair  Pain is worse with: walking, bending, sitting and standing Pain improves with: rest and heat/ice Relief from Meds: 2  Mobility walk with assistance ability to climb steps?  yes do you drive?  yes  Function I need assistance with the following:  meal prep and household duties  Neuro/Psych weakness numbness tingling spasms anxiety  Prior Studies no  Physicians involved in your care no   Family History  Problem Relation Age of Onset  . Hypertension Mother   . Diabetes Mother   . Hypertension Father   . Diabetes Father   . Cancer Paternal Grandmother        unknown  . Colon cancer Neg Hx    Social History   Socioeconomic History  . Marital status: Single    Spouse name: Not on file  . Number of children: 3  . Years of education: Not on file  . Highest education level: Not on file  Occupational History  . Not on file  Social Needs  . Financial resource strain: Not on file  . Food insecurity    Worry: Not on file    Inability: Not on file  . Transportation needs    Medical: Not on file    Non-medical: Not on file  Tobacco Use  . Smoking status: Never Smoker  . Smokeless tobacco:  Never Used  Substance and Sexual Activity  . Alcohol use: No    Alcohol/week: 0.0 standard drinks  . Drug use: No  . Sexual activity: Never    Birth control/protection: Post-menopausal, Surgical  Lifestyle  . Physical activity    Days per week: Not on file    Minutes per session: Not on file  . Stress: Not on file  Relationships  . Social Herbalist on phone: Not on file    Gets together: Not on file    Attends religious service: Not on file    Active member of club or organization: Not on file    Attends meetings of clubs or organizations: Not on file    Relationship status: Not on file  Other Topics Concern  . Not on file  Social History Narrative  . Not on file   Past Surgical History:  Procedure Laterality Date  . ABDOMINAL HYSTERECTOMY     still has ovaries  . APPENDECTOMY    . AXILLARY LYMPH NODE DISSECTION  11/28/2011   Procedure: AXILLARY LYMPH NODE DISSECTION;  Surgeon: Edward Jolly, MD;  Location: [facility];  Service: General;  Laterality: Left;  . BREAST LUMPECTOMY Left 11/28/2011   Malignant  . BREAST SURGERY Left 2013  . CARPAL  TUNNEL RELEASE     Bilateral  . CESAREAN SECTION     pt. has had 3  . CHOLECYSTECTOMY    . COLONOSCOPY    . EYE SURGERY Bilateral    cataract removal  . KNEE SURGERY     Left Knee  . LAMINECTOMY WITH POSTERIOR LATERAL ARTHRODESIS LEVEL 1 N/A 11/29/2017   Procedure: Posterior Lateral Fusion - Lumbar Four-Lumbar Five, removal and replacement of hardware, Laminectomy - Lumbar Four-Lumbar Five;  Surgeon: Eustace Moore, MD;  Location: [facility];  Service: Neurosurgery;  Laterality: N/A;  . LAMINECTOMY WITH POSTERIOR LATERAL ARTHRODESIS LEVEL 2 N/A 06/07/2017   Procedure: Posterior Lateral Fusion Lumbar Three-Four and Transforaminal Interbody Fusion Lumbar Four-Five with Segmental  Pedicle Screw Fixation;  Surgeon: Eustace Moore, MD;  Location: [place];  Service: Neurosurgery;  Laterality: N/A;  Posterior Lateral Fusion Lumbar  Three-Four and Transforaminal Interbody Fusion Lumbar Four-Five with Segmental  Pedicle Screw Fixation   . LUMBAR FUSION  06/07/2017   POST  . LUMBAR LAMINECTOMY/DECOMPRESSION MICRODISCECTOMY Bilateral 01/27/2016   Procedure: Laminectomy and Foraminotomy - Lumbar four -Lumbar five - bilateral- on-lay noninstrumented fusion;  Surgeon: Eustace Moore, MD;  Location: [place] [facility];  Service: Neurosurgery;  Laterality: Bilateral;  . MULTIPLE EXTRACTIONS WITH ALVEOLOPLASTY N/A 05/11/2016   Procedure: EXTRACTION OF TEETH EIGHTEEN, TWENTY AND TWENTY- NINE;  REMOVAL OF MANDIBULAR TORUS AND EXOSTOSIS;  Surgeon: Diona Browner, DDS;  Location: [place];  Service: Oral Surgery;  Laterality: N/A;  . PORTACATH PLACEMENT  06/18/2011   Procedure: INSERTION PORT-A-CATH;  Surgeon: Edward Jolly, MD;  Location: [place];  Service: General;  Laterality: Right;  right subclavian  . removal portacath  2014  . spur     Apex spur on both big toes  . TOE SURGERY Bilateral   . TONSILLECTOMY    . TOTAL KNEE ARTHROPLASTY Left 09/13/2014  . TOTAL KNEE ARTHROPLASTY Left 09/13/2014   Procedure: LEFT TOTAL KNEE ARTHROPLASTY;  Surgeon: Rod Can, MD;  Location: [place];  Service: Orthopedics;  Laterality: Left;  . TOTAL KNEE ARTHROPLASTY Right 03/10/2015   Procedure: RIGHT TOTAL KNEE ARTHROPLASTY;  Surgeon: Rod Can, MD;  Location: [facility];  Service: Orthopedics;  Laterality: Right;   Past Medical History:  Diagnosis Date  . Anemia   . Anxiety   . Arthritis   . Breast cancer ([place]) 2010   T3N1 invasive ductal carcinoma left breast.Takes Arimidex daily  . Bursitis   . Carpal tunnel syndrome   . Chronic back pain    stenosis  . Constipation    takes Colace daily  . Depression    takes Benzotropine daily  . Diverticulitis of colon   . Dyspnea    daily when walking for over 1 yr.  . Fibromyalgia 08/2012  . GERD (gastroesophageal reflux disease)    takes Dexilant daily  . Hemorrhoid   .  History of blood transfusion    no abnormal reaction noted  . History of colon polyps    benign  . History of shingles   . Joint pain   . Joint swelling   . Night muscle spasms    takes Flexeril nightly as needed  . Nocturia   . OSA (obstructive sleep apnea)   . OSA on CPAP   . Peripheral edema    takes Furosemide.Just started 01/18/16  . Peripheral neuropathy    takes Lyrica daily  . Personal history of chemotherapy    2013  . Personal history of radiation  therapy    2013  . Pneumonia    hx of-2015  . Pseudoarthrosis of lumbar spine   . Seasonal allergies    takes Singulair nightly  . SOB (shortness of breath) on exertion    rarely with exertion  . 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 Alphonzo Grieve)   BP 125/87   Pulse 86   Temp (!) 97.2 F (36.2 C)   Resp 12   Ht 5\' 3"  (1.6 m)   Wt 291 lb (132 kg)   SpO2 95%   BMI 51.55 kg/m   Opioid Risk Score:   Fall Risk Score:  `1  Depression screen PHQ 2/9  Depression screen [facility] 2/9 08/04/2018 06/06/2018 04/04/2018 01/13/2018 12/13/2017 06/21/2017 03/07/2017  Decreased Interest 0 0 0 3 3 1 2   Down, Depressed, Hopeless 0 0 0 3 3 1 2   PHQ - 2 Score 0 0 0 6 6 2 4   Altered sleeping - - - - - - 0  Tired, decreased energy - - - - - - 3  Change in appetite - - - - - - 3  Feeling bad or failure about yourself  - - - - - - 2  Trouble concentrating - - - - - - 3  Moving slowly or fidgety/restless - - - - - - 3  Suicidal thoughts - - - - - - 0  PHQ-9 Score - - - - - - 18  Difficult doing work/chores - - - - - - Extremely dIfficult  Some recent data might be hidden      Review of Systems  All other systems reviewed and are negative.      Objective:   Physical Exam Constitutional:      Appearance: Normal appearance. She is obese.  Neck:     Musculoskeletal: Normal range of motion and neck supple.  Cardiovascular:      Rate and Rhythm: Normal rate and regular rhythm.     Pulses: Normal pulses.     Heart sounds: Normal heart sounds.  Pulmonary:     Effort: Pulmonary effort is normal.     Breath sounds: Normal breath sounds.  Musculoskeletal:     Comments: Normal Muscle Bulk and Muscle Testing Reveals:  Upper Extremities:Decreased  ROM 90 Degrees and Muscle Strength 5/5  Bilateral AC Joint Tenderness  Thoracic Paraspinal Tenderness: T-7-T-9 Lumbar Paraspinal Tenderness: L-4-L-5 Lower Extremities: Decreased ROM and Muscle Strength 5/5  Right Lower Extremity Flexion Produces Pain into her right lower extremity Arises from Table Slowly Narrow Based Gait   Skin:    General: Skin is warm and dry.  Neurological:     Mental Status: She is alert and oriented to person, place, and time.  Psychiatric:        Mood and Affect: Mood normal.        Behavior: Behavior normal.           Assessment & Plan:  1. Lumbar Postlaminectomy/ S/P Lumbar Fusion:Spinal Stenosis: S/PPosterior Lateral Fusion L-4-L-5 removal and replacement of hardware, Laminectomy L4-L5 by Dr. Ronnald Ramp on 12/29/2017.  Continue HEP as Tolerated and Continue current medication regime. Refilled:Slow Weaning:  Hydrocodone7.5/325 mg one tablet every 6 hours as needed#110.12/30/2018 2. Lumbar Radiculitis: Continue current medication regime with Lyrica.12/30/2018 3. Fibromyalgia:Continue HEP as Tolerated.Continue Current Medication Regimen with Lyrica.12/30/2018 4.RightGreater Trochanter Bursitis:No complaints today.Continue to Alternate Ice and Heat Therapy.12/30/2018. 5. Muscle Spasm:Continue:Flexeril..Continue to  Monitor.12/30/2018. 6. Chronic Pain Syndrome: Continue Current medication regimen and HEP as Tolerated. Continue to Monitor.12/30/2018. 7. Bilateral OA of Bilateral Knees: Orthopedist following.Continue to Monitor.12/30/2018. 8. Bilateral Hand Pain:No complaints today.Continue to Monitor/ ? OA: Continue to  Monitor.12/30/2018 9. Cervicalgia:No complaints today.Continue to alternate Heat/Ice Therapy. Continue HEP as Tolerated.12/30/2018. 10. Chronic Bilateral Shoulders:No complaints Today.Continue to alternate Ice/ Heat therapy and HEP as Tolerated.12/30/2018. 11. Lumbar Radiculitis: Continue Lyrica.12/30/2018  15 minutes of Face to Face Patient Care time was spent during this visit. All questions encouraged and answered,   F/U in 1 month

## 2019-01-02 ENCOUNTER — Encounter: Payer: Self-pay | Admitting: Podiatry

## 2019-01-02 ENCOUNTER — Other Ambulatory Visit: Payer: Self-pay

## 2019-01-02 ENCOUNTER — Telehealth: Payer: Self-pay | Admitting: *Deleted

## 2019-01-02 ENCOUNTER — Ambulatory Visit (INDEPENDENT_AMBULATORY_CARE_PROVIDER_SITE_OTHER): Payer: Medicare Other | Admitting: Podiatry

## 2019-01-02 VITALS — Temp 98.2°F

## 2019-01-02 DIAGNOSIS — M779 Enthesopathy, unspecified: Secondary | ICD-10-CM | POA: Diagnosis not present

## 2019-01-02 DIAGNOSIS — R609 Edema, unspecified: Secondary | ICD-10-CM

## 2019-01-02 DIAGNOSIS — M76829 Posterior tibial tendinitis, unspecified leg: Secondary | ICD-10-CM | POA: Diagnosis not present

## 2019-01-02 DIAGNOSIS — I82461 Acute embolism and thrombosis of right calf muscular vein: Secondary | ICD-10-CM

## 2019-01-02 DIAGNOSIS — R252 Cramp and spasm: Secondary | ICD-10-CM

## 2019-01-02 NOTE — Telephone Encounter (Signed)
Dr. Jacqualyn Posey states could perform the venous doppler on 01/05/2019 if available. [facility] - Zigmund Daniel states can schedule 01/05/2019 at 9:00am. I told Zigmund Daniel I would inform Kelsey Wilson of the venous doppler appt.

## 2019-01-02 NOTE — Telephone Encounter (Signed)
[facility] states Financial planner for dopplers will call to assist.

## 2019-01-02 NOTE — Telephone Encounter (Signed)
[facility] - MEDICAID NOTIFICATION STATEMENT, "THIS [place] MEDICAID MEMBER DOES NOT REQUIRE PRIOR AUTHORIZATION FOR OUTPATIENT RADIOLOGY THROUGH [facility] OR [place] [facility] AT THIS TIME." Faxed orders with authorization to [facility]. I informed pt of scheduled doppler appt 01/05/2019.

## 2019-01-05 ENCOUNTER — Ambulatory Visit (HOSPITAL_COMMUNITY)
Admission: RE | Admit: 2019-01-05 | Discharge: 2019-01-05 | Disposition: A | Discharge status: Home / Self Care | Payer: Medicare Other | Source: Ambulatory Visit | Attending: Cardiology | Admitting: Cardiology

## 2019-01-05 ENCOUNTER — Other Ambulatory Visit: Payer: Self-pay

## 2019-01-05 ENCOUNTER — Telehealth: Payer: Self-pay | Admitting: *Deleted

## 2019-01-05 DIAGNOSIS — R252 Cramp and spasm: Secondary | ICD-10-CM

## 2019-01-05 DIAGNOSIS — I82461 Acute embolism and thrombosis of right calf muscular vein: Secondary | ICD-10-CM

## 2019-01-05 NOTE — Telephone Encounter (Signed)
[facility] - Kelsey Wilson states pt is negative for DVT, does have supra-patella fluid evident, pt has been informed.

## 2019-01-09 NOTE — Telephone Encounter (Signed)
-----   Message from Trula Slade, DPM sent at 01/09/2019  7:13 AM EDT ----- Negative DVT- if having knee pain can refer to ortho due to fluid in the knee

## 2019-01-09 NOTE — Telephone Encounter (Signed)
I informed pt of Dr. Leigh Aurora review of results and recommendations. Pt states she has an orthopedist and has contacted.

## 2019-01-23 ENCOUNTER — Encounter: Payer: Self-pay | Admitting: Podiatry

## 2019-01-23 ENCOUNTER — Ambulatory Visit (INDEPENDENT_AMBULATORY_CARE_PROVIDER_SITE_OTHER): Payer: Medicare Other | Admitting: Podiatry

## 2019-01-23 ENCOUNTER — Other Ambulatory Visit: Payer: Self-pay

## 2019-01-23 ENCOUNTER — Telehealth: Payer: Self-pay | Admitting: *Deleted

## 2019-01-23 DIAGNOSIS — M76829 Posterior tibial tendinitis, unspecified leg: Secondary | ICD-10-CM

## 2019-01-23 DIAGNOSIS — M779 Enthesopathy, unspecified: Secondary | ICD-10-CM

## 2019-01-23 DIAGNOSIS — R252 Cramp and spasm: Secondary | ICD-10-CM

## 2019-01-23 DIAGNOSIS — M19079 Primary osteoarthritis, unspecified ankle and foot: Secondary | ICD-10-CM

## 2019-01-23 DIAGNOSIS — M6281 Muscle weakness (generalized): Secondary | ICD-10-CM

## 2019-01-23 NOTE — Telephone Encounter (Signed)
-----   Message from Trula Slade, DPM sent at 01/23/2019 11:10 AM EDT ----- Can you please order a MRI of the right ankle to rule out partial tear of posterior tibial tendon?

## 2019-01-23 NOTE — Progress Notes (Signed)
Subjective: 58 year old female presents the office today for 2-week follow-up for right ankle pain.  She is using Mobic but still having a hard time putting on the ankle brace is still having pain at nighttime most of the side of the ankle.  She has having more swelling to the leg.  Occasional cramping of the leg. Denies any systemic complaints such as fevers, chills, nausea, vomiting. No acute changes since last appointment, and no other complaints at this time.   Objective: AAO x3, NAD DP/PT pulses palpable bilaterally, CRT less than 3 seconds Persistent palpation the course with no severe tibial tendon there is edema to the area but there is does appear to be an increase in swelling the leg and she is describing cramping to her calf Still supple.  There is no erythema or warmth. No open lesions or pre-ulcerative lesions.  No pain with calf compression, swelling, warmth, erythema  Assessment: Posterior tibial tendinitis/flexor tendinitis; rule out DVT  Plan: -All treatment options discussed with the patient including all alternatives, risks, complications.  -Given her swelling and cramping leg will order venous duplex. -Immobilization.  Meloxicam as needed.Alvera Singh to the area daily.  Consider MRI if no improvement next appointment. -Patient encouraged to call the office with any questions, concerns, change in symptoms.   Trula Slade DPM

## 2019-01-23 NOTE — Progress Notes (Signed)
Subjective: 58 year old female presents the office today follow-up evaluation of right ankle pain.  She states the pain is improving.  She gets pain mostly at night after being on her feet all day with prolonged standing.  Overall is getting better she has not been using the meloxicam.  She was not able to wear the cam boot.  She does use a brace as needed.  She did follow-up with orthopedics and not able to see any fluid underneath it.  She is prescribed physical therapy for the knee. Denies any systemic complaints such as fevers, chills, nausea, vomiting. No acute changes since last appointment, and no other complaints at this time.   Objective: AAO x3, NAD DP/PT pulses palpable bilaterally, CRT less than 3 seconds There is still mild tenderness palpation directly along the medial lateral ankle just posterior and proximal medial malleolus.  No pain inferior on the insertion of the navicular tuberosity.  Minimal edema but there is no erythema or warmth there is no other areas of tenderness identified today.  Crepitus present.  No open lesions or pre-ulcerative lesions.  No pain with calf compression, swelling, warmth, erythema  Assessment: 58 year old female with right posterior tibial, flexor tendinitis  Plan: -All treatment options discussed with the patient including all alternatives, risks, complications.  -Overall she is doing better, order an MRI to rule out a partial tear of the posterior tibial flexor tendon.  Like for her to start physical therapy will get the MRI first.  Continue ankle brace.  Continue ice elevation. -Patient encouraged to call the office with any questions, concerns, change in symptoms.   Kelsey Wilson DPM

## 2019-01-28 NOTE — Telephone Encounter (Signed)
[facility], "THIS [place] MEDICAID MEMBER DOES NOT REQUIRE PRIOR AUTHORIZATION FOR OUTPATIENT RADIOLOGY THROUGH [facility] OR [place] [facility] AT THIS TIME."  Faxed to [facility].

## 2019-01-30 ENCOUNTER — Ambulatory Visit: Payer: Medicare Other | Admitting: Registered Nurse

## 2019-02-02 ENCOUNTER — Encounter: Payer: Self-pay | Admitting: Registered Nurse

## 2019-02-02 ENCOUNTER — Other Ambulatory Visit: Payer: Self-pay

## 2019-02-02 ENCOUNTER — Encounter: Payer: Medicare Other | Attending: Physical Medicine & Rehabilitation | Admitting: Registered Nurse

## 2019-02-02 VITALS — BP 121/75 | HR 98 | Temp 98.7°F | Ht 63.0 in | Wt 294.2 lb

## 2019-02-02 DIAGNOSIS — M797 Fibromyalgia: Secondary | ICD-10-CM

## 2019-02-02 DIAGNOSIS — G8929 Other chronic pain: Secondary | ICD-10-CM | POA: Diagnosis not present

## 2019-02-02 DIAGNOSIS — F329 Major depressive disorder, single episode, unspecified: Secondary | ICD-10-CM | POA: Diagnosis not present

## 2019-02-02 DIAGNOSIS — G894 Chronic pain syndrome: Secondary | ICD-10-CM

## 2019-02-02 DIAGNOSIS — K219 Gastro-esophageal reflux disease without esophagitis: Secondary | ICD-10-CM | POA: Diagnosis not present

## 2019-02-02 DIAGNOSIS — M25572 Pain in left ankle and joints of left foot: Secondary | ICD-10-CM

## 2019-02-02 DIAGNOSIS — M48061 Spinal stenosis, lumbar region without neurogenic claudication: Secondary | ICD-10-CM | POA: Diagnosis not present

## 2019-02-02 DIAGNOSIS — G4733 Obstructive sleep apnea (adult) (pediatric): Secondary | ICD-10-CM | POA: Insufficient documentation

## 2019-02-02 DIAGNOSIS — M546 Pain in thoracic spine: Secondary | ICD-10-CM

## 2019-02-02 DIAGNOSIS — G8918 Other acute postprocedural pain: Secondary | ICD-10-CM | POA: Insufficient documentation

## 2019-02-02 DIAGNOSIS — M545 Low back pain: Secondary | ICD-10-CM | POA: Insufficient documentation

## 2019-02-02 DIAGNOSIS — M5416 Radiculopathy, lumbar region: Secondary | ICD-10-CM

## 2019-02-02 DIAGNOSIS — M62838 Other muscle spasm: Secondary | ICD-10-CM

## 2019-02-02 DIAGNOSIS — M1712 Unilateral primary osteoarthritis, left knee: Secondary | ICD-10-CM

## 2019-02-02 DIAGNOSIS — Z9889 Other specified postprocedural states: Secondary | ICD-10-CM | POA: Insufficient documentation

## 2019-02-02 DIAGNOSIS — Z981 Arthrodesis status: Secondary | ICD-10-CM

## 2019-02-02 DIAGNOSIS — M961 Postlaminectomy syndrome, not elsewhere classified: Secondary | ICD-10-CM

## 2019-02-02 DIAGNOSIS — M1711 Unilateral primary osteoarthritis, right knee: Secondary | ICD-10-CM

## 2019-02-02 DIAGNOSIS — Z853 Personal history of malignant neoplasm of breast: Secondary | ICD-10-CM | POA: Diagnosis not present

## 2019-02-02 DIAGNOSIS — M25571 Pain in right ankle and joints of right foot: Secondary | ICD-10-CM

## 2019-02-02 DIAGNOSIS — Z5181 Encounter for therapeutic drug level monitoring: Secondary | ICD-10-CM

## 2019-02-02 NOTE — Progress Notes (Signed)
Subjective:    Patient ID: Kelsey Wilson, female    DOB: 09-27-1960, 58 y.o.   MRN: [id number]  HPI: Kelsey Wilson is a 58 y.o. female who returns for follow up appointment for chronic pain and medication refill. She states her pain is located in her mid- lower back radiating into her right buttock and bilateral ankles R>L. She rates her pain 7. Her current exercise regime is walking and attending physical therapy two days a week.   Ms. Borron Morphine equivalent is 22.30 MME.  Last Oral Swab was Performed on 06/06/2018, it was consistent.    Pain Inventory Average Pain 9 Pain Right Now 7 My pain is aching  In the last 24 hours, has pain interfered with the following? General activity 7 Relation with others 6 Enjoyment of life 7 What TIME of day is your pain at its worst? daytime Sleep (in general) Fair  Pain is worse with: walking, bending, sitting and standing Pain improves with: rest and medication Relief from Meds: .  Mobility use a cane ability to climb steps?  yes do you drive?  yes Do you have any goals in this area?  no  Function I need assistance with the following:  meal prep and household duties  Neuro/Psych numbness spasms depression anxiety  Prior Studies Any changes since last visit?  no  Physicians involved in your care Any changes since last visit?  no   Family History  Problem Relation Age of Onset  . Hypertension Mother   . Diabetes Mother   . Hypertension Father   . Diabetes Father   . Cancer Paternal Grandmother        unknown  . Colon cancer Neg Hx    Social History   Socioeconomic History  . Marital status: Single    Spouse name: Not on file  . Number of children: 3  . Years of education: Not on file  . Highest education level: Not on file  Occupational History  . Not on file  Social Needs  . Financial resource strain: Not on file  . Food insecurity    Worry: Not on file    Inability: Not on file  .  Transportation needs    Medical: Not on file    Non-medical: Not on file  Tobacco Use  . Smoking status: Never Smoker  . Smokeless tobacco: Never Used  Substance and Sexual Activity  . Alcohol use: No    Alcohol/week: 0.0 standard drinks  . Drug use: No  . Sexual activity: Never    Birth control/protection: Post-menopausal, Surgical  Lifestyle  . Physical activity    Days per week: Not on file    Minutes per session: Not on file  . Stress: Not on file  Relationships  . Social Herbalist on phone: Not on file    Gets together: Not on file    Attends religious service: Not on file    Active member of club or organization: Not on file    Attends meetings of clubs or organizations: Not on file    Relationship status: Not on file  Other Topics Concern  . Not on file  Social History Narrative  . Not on file   Past Surgical History:  Procedure Laterality Date  . ABDOMINAL HYSTERECTOMY     still has ovaries  . APPENDECTOMY    . AXILLARY LYMPH NODE DISSECTION  11/28/2011   Procedure: AXILLARY LYMPH NODE DISSECTION;  Surgeon: Marland Kitchen T  Hoxworth, MD;  Location: [place];  Service: General;  Laterality: Left;  . BREAST LUMPECTOMY Left 11/28/2011   Malignant  . BREAST SURGERY Left 2013  . CARPAL TUNNEL RELEASE     Bilateral  . CESAREAN SECTION     pt. has had 3  . CHOLECYSTECTOMY    . COLONOSCOPY    . EYE SURGERY Bilateral    cataract removal  . KNEE SURGERY     Left Knee  . LAMINECTOMY WITH POSTERIOR LATERAL ARTHRODESIS LEVEL 1 N/A 11/29/2017   Procedure: Posterior Lateral Fusion - Lumbar Four-Lumbar Five, removal and replacement of hardware, Laminectomy - Lumbar Four-Lumbar Five;  Surgeon: Eustace Moore, MD;  Location: [facility] OR;  Service: Neurosurgery;  Laterality: N/A;  . LAMINECTOMY WITH POSTERIOR LATERAL ARTHRODESIS LEVEL 2 N/A 06/07/2017   Procedure: Posterior Lateral Fusion Lumbar Three-Four and Transforaminal Interbody Fusion Lumbar Four-Five with Segmental  Pedicle  Screw Fixation;  Surgeon: Eustace Moore, MD;  Location: [place];  Service: Neurosurgery;  Laterality: N/A;  Posterior Lateral Fusion Lumbar Three-Four and Transforaminal Interbody Fusion Lumbar Four-Five with Segmental  Pedicle Screw Fixation   . LUMBAR FUSION  06/07/2017   POST  . LUMBAR LAMINECTOMY/DECOMPRESSION MICRODISCECTOMY Bilateral 01/27/2016   Procedure: Laminectomy and Foraminotomy - Lumbar four -Lumbar five - bilateral- on-lay noninstrumented fusion;  Surgeon: Eustace Moore, MD;  Location: [place] [facility];  Service: Neurosurgery;  Laterality: Bilateral;  . MULTIPLE EXTRACTIONS WITH ALVEOLOPLASTY N/A 05/11/2016   Procedure: EXTRACTION OF TEETH EIGHTEEN, TWENTY AND TWENTY- NINE;  REMOVAL OF MANDIBULAR TORUS AND EXOSTOSIS;  Surgeon: Diona Browner, DDS;  Location: [place];  Service: Oral Surgery;  Laterality: N/A;  . PORTACATH PLACEMENT  06/18/2011   Procedure: INSERTION PORT-A-CATH;  Surgeon: Edward Jolly, MD;  Location: [place];  Service: General;  Laterality: Right;  right subclavian  . removal portacath  2014  . spur     Apex spur on both big toes  . TOE SURGERY Bilateral   . TONSILLECTOMY    . TOTAL KNEE ARTHROPLASTY Left 09/13/2014  . TOTAL KNEE ARTHROPLASTY Left 09/13/2014   Procedure: LEFT TOTAL KNEE ARTHROPLASTY;  Surgeon: Rod Can, MD;  Location: [place];  Service: Orthopedics;  Laterality: Left;  . TOTAL KNEE ARTHROPLASTY Right 03/10/2015   Procedure: RIGHT TOTAL KNEE ARTHROPLASTY;  Surgeon: Rod Can, MD;  Location: [facility];  Service: Orthopedics;  Laterality: Right;   Past Medical History:  Diagnosis Date  . Anemia   . Anxiety   . Arthritis   . Breast cancer ([place]) 2010   T3N1 invasive ductal carcinoma left breast.Takes Arimidex daily  . Bursitis   . Carpal tunnel syndrome   . Chronic back pain    stenosis  . Constipation    takes Colace daily  . Depression    takes Benzotropine daily  . Diverticulitis of colon   . Dyspnea    daily when  walking for over 1 yr.  . Fibromyalgia 08/2012  . GERD (gastroesophageal reflux disease)    takes Dexilant daily  . Hemorrhoid   . History of blood transfusion    no abnormal reaction noted  . History of colon polyps    benign  . History of shingles   . Joint pain   . Joint swelling   . Night muscle spasms    takes Flexeril nightly as needed  . Nocturia   . OSA (obstructive sleep apnea)   . OSA on CPAP   . Peripheral edema  takes Furosemide.Just started 01/18/16  . Peripheral neuropathy    takes Lyrica daily  . Personal history of chemotherapy    2013  . Personal history of radiation therapy    2013  . Pneumonia    hx of-2015  . Pseudoarthrosis of lumbar spine   . Seasonal allergies    takes Singulair nightly  . SOB (shortness of breath) on exertion    rarely with exertion  . 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 Alphonzo Grieve)   BP 121/75   Pulse 98   Temp 98.7 F (37.1 C)   Ht 5\' 3"  (1.6 m)   Wt 294 lb 3.2 oz (133.4 kg)   SpO2 95%   BMI 52.12 kg/m   Opioid Risk Score:   Fall Risk Score:  `1  Depression screen PHQ 2/9  Depression screen [facility] 2/9 08/04/2018 06/06/2018 04/04/2018 01/13/2018 12/13/2017 06/21/2017 03/07/2017  Decreased Interest 0 0 0 3 3 1 2   Down, Depressed, Hopeless 0 0 0 3 3 1 2   PHQ - 2 Score 0 0 0 6 6 2 4   Altered sleeping - - - - - - 0  Tired, decreased energy - - - - - - 3  Change in appetite - - - - - - 3  Feeling bad or failure about yourself  - - - - - - 2  Trouble concentrating - - - - - - 3  Moving slowly or fidgety/restless - - - - - - 3  Suicidal thoughts - - - - - - 0  PHQ-9 Score - - - - - - 18  Difficult doing work/chores - - - - - - Extremely dIfficult  Some recent data might be hidden     Review of Systems  Constitutional: Negative.   HENT: Negative.   Eyes: Negative.   Respiratory: Negative.   Cardiovascular:  Negative.   Gastrointestinal: Negative.   Endocrine: Negative.   Genitourinary: Negative.   Musculoskeletal: Negative.   Skin: Negative.   Allergic/Immunologic: Negative.   Neurological: Positive for numbness.  Hematological: Negative.   Psychiatric/Behavioral: Positive for dysphoric mood. The patient is nervous/anxious.   All other systems reviewed and are negative.      Objective:   Physical Exam Vitals signs and nursing note reviewed.  Constitutional:      Appearance: Normal appearance.  Neck:     Musculoskeletal: Normal range of motion and neck supple.  Cardiovascular:     Rate and Rhythm: Normal rate and regular rhythm.     Pulses: Normal pulses.     Heart sounds: Normal heart sounds.  Pulmonary:     Effort: Pulmonary effort is normal.     Breath sounds: Normal breath sounds.  Musculoskeletal:     Comments: Normal Muscle Bulk and Muscle Testing Reveals:  Upper Extremities: Full ROM and Muscle Strength 5/5 Thoracic Paraspinal Tenderness: T-7-T-9  Lumbar Paraspinal Tenderness: L-3-L-5 Lower Extremities: Full ROM and Muscle Strength 5/5  Arises from chair slowly Narrow Based Gait   Skin:    General: Skin is warm and dry.  Neurological:     Mental Status: She is alert and oriented to person, place, and time.  Psychiatric:        Mood and Affect: Mood normal.        Behavior: Behavior normal.           Assessment & Plan:  1. Lumbar Postlaminectomy/  S/P Lumbar Fusion:Spinal Stenosis: S/PPosterior Lateral Fusion L-4-L-5 removal and replacement of hardware, Laminectomy L4-L5 by Dr. Ronnald Ramp on 12/29/2017.  Continue HEP as Tolerated and Continue current medication regime. Continue with Slow Weaning:  Hydrocodone7.5/325 mg one tablet every 6 hours as needed#110.02/02/2019 2. Lumbar Radiculitis: Continue current medication regime with Lyrica.02/02/2019 3. Fibromyalgia:Continue HEP as Tolerated.Continue Current Medication Regimen with Lyrica.02/02/2019  4.RightGreater Trochanter Bursitis:No complaints today.Continue to Alternate Ice and Heat Therapy.02/02/2019. 5. Muscle Spasm:Continue:Flexeril..Continue to Monitor.02/02/2019. 6. Chronic Pain Syndrome: Continue Current medication regimen and HEP as Tolerated. Continue to Monitor.02/02/2019. 7. Bilateral OA of Bilateral Knees: Orthopedist following.Continue to Monitor.02/02/2019. 8. Bilateral Hand Pain:No complaints today.Continue to Monitor/ ? OA: Continue to Monitor.02/02/2019 9. Cervicalgia:No complaints today.Continue to alternate Heat/Ice Therapy. Continue HEP as Tolerated.02/02/2019. 10. Chronic Bilateral Shoulders:No complaints Today.Continue to alternate Ice/ Heat therapy and HEP as Tolerated.02/02/2019. 11. Bilateral Ankle Pain: Podiatry Following: She is scheduled for MRI.    23minutes of Face to Face Patient Care time was spent during this visit. All questions encouraged and answered,   F/U in 1 month

## 2019-02-05 ENCOUNTER — Other Ambulatory Visit: Payer: Self-pay

## 2019-02-05 ENCOUNTER — Ambulatory Visit
Admission: RE | Admit: 2019-02-05 | Discharge: 2019-02-05 | Disposition: A | Discharge status: Home / Self Care | Payer: Medicare Other | Source: Ambulatory Visit | Attending: Podiatry | Admitting: Podiatry

## 2019-02-05 DIAGNOSIS — M779 Enthesopathy, unspecified: Secondary | ICD-10-CM

## 2019-02-05 DIAGNOSIS — M6281 Muscle weakness (generalized): Secondary | ICD-10-CM

## 2019-02-05 DIAGNOSIS — M19079 Primary osteoarthritis, unspecified ankle and foot: Secondary | ICD-10-CM

## 2019-02-05 DIAGNOSIS — R252 Cramp and spasm: Secondary | ICD-10-CM

## 2019-02-05 DIAGNOSIS — M76829 Posterior tibial tendinitis, unspecified leg: Secondary | ICD-10-CM

## 2019-02-05 IMAGING — MR MR ANKLE*R* W/O CM
4 of 5 series · 12 of 40 positions shown · non-contrast
Comparison: Radiographs from 12/12/2018

CLINICAL DATA: Diffuse right ankle pain for 4 months.

EXAM:
MRI OF THE RIGHT ANKLE WITHOUT CONTRAST
TECHNIQUE: Multiplanar, multisequence MR imaging of the ankle was performed. No
intravenous contrast was administered.

[Series 5: T2 fat-sat · axial · 3.5mm · 0.22mm/px · z∈[-24,+59]mm · 3 of 30 slices shown (1 of 3)]
[im 4/30]
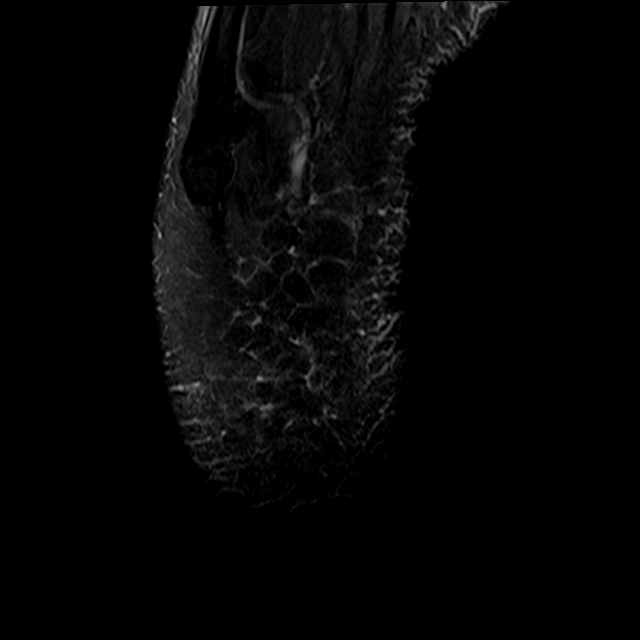
[im 15/30]
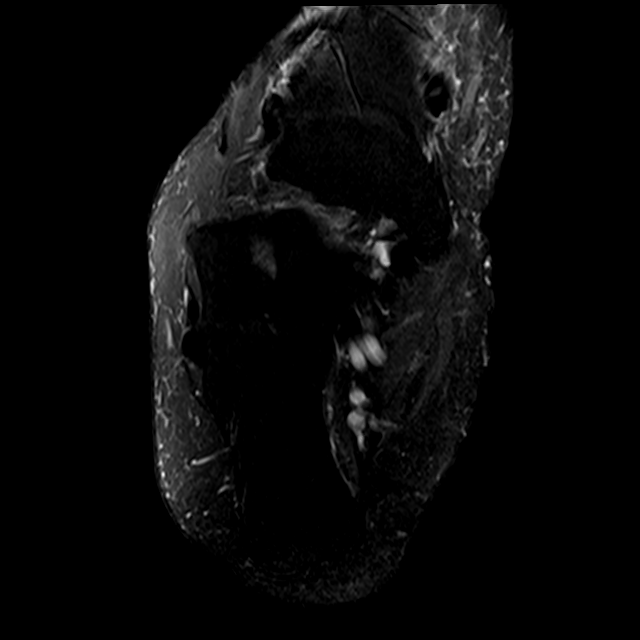
[im 26/30]
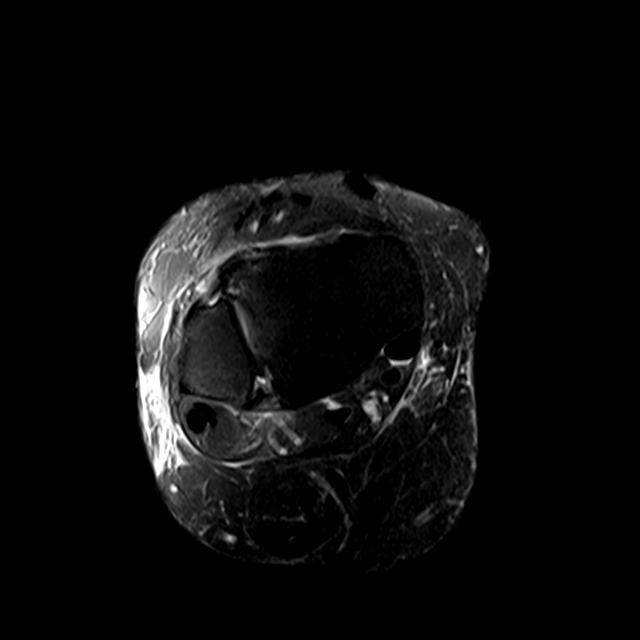

[Series 6: PD fat-sat · axial · 3.5mm · 0.22mm/px · z∈[-24,+59]mm · 3 of 30 slices shown]
[im 4/30]
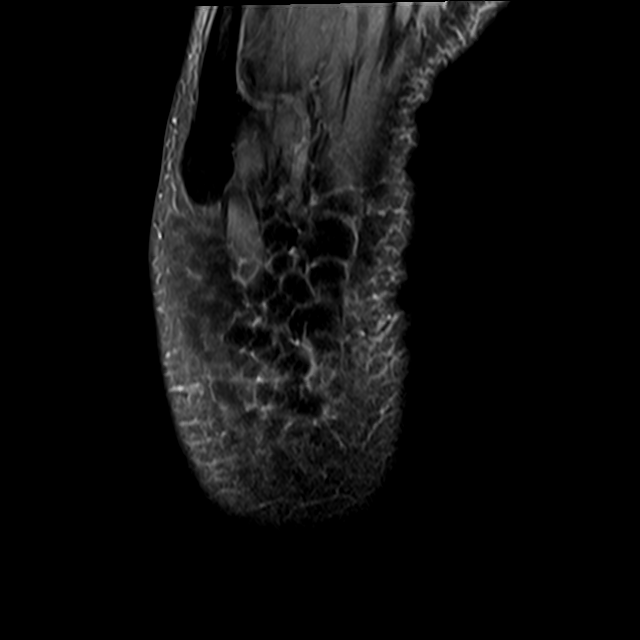
[im 15/30]
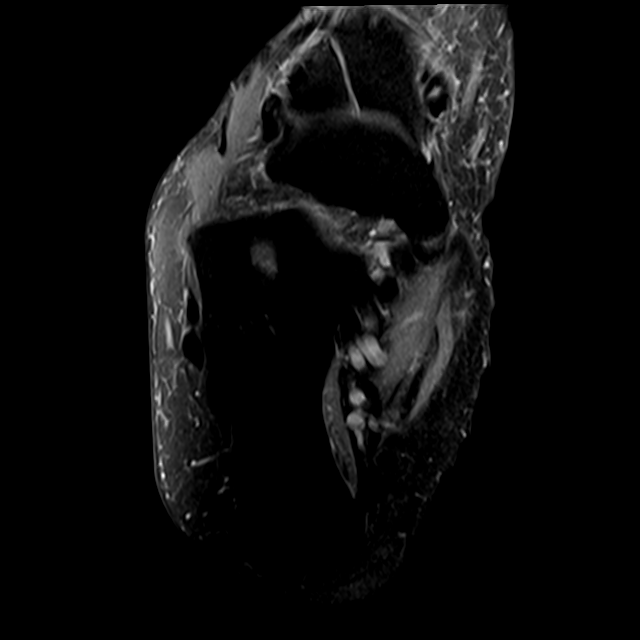
[im 26/30]
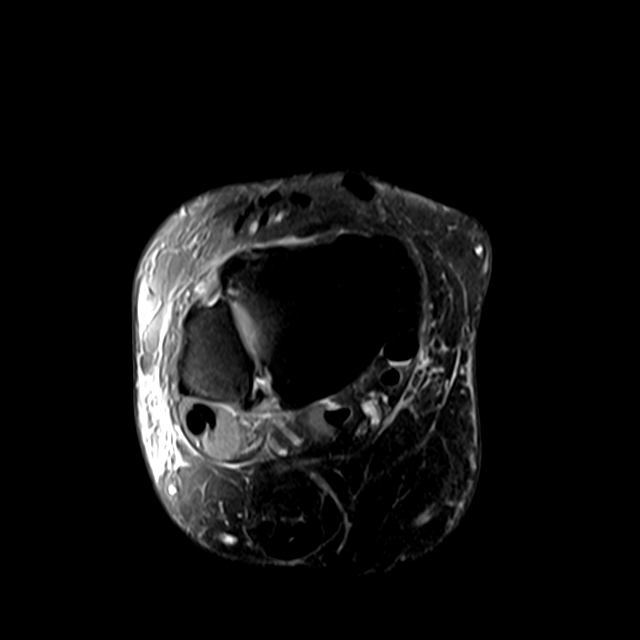

[Series 8: T2 fat-sat · sagittal · 3.5mm · 0.25mm/px · 3 of 22 slices shown (2 of 3)]
[im 4/22]
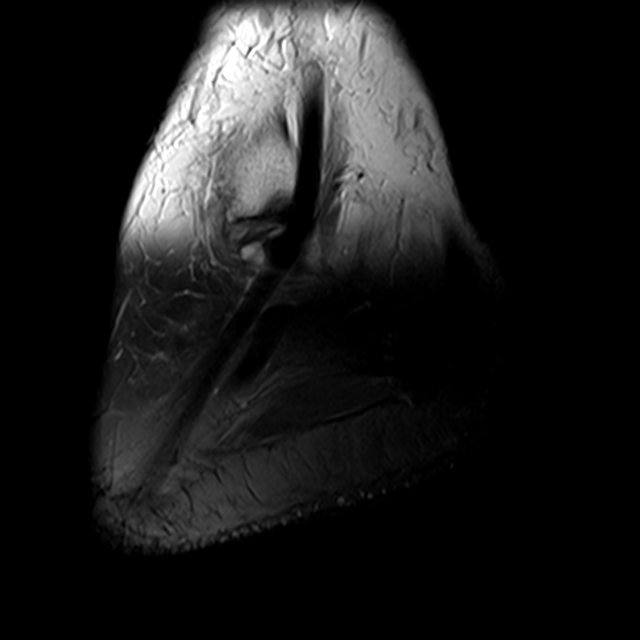
[im 11/22]
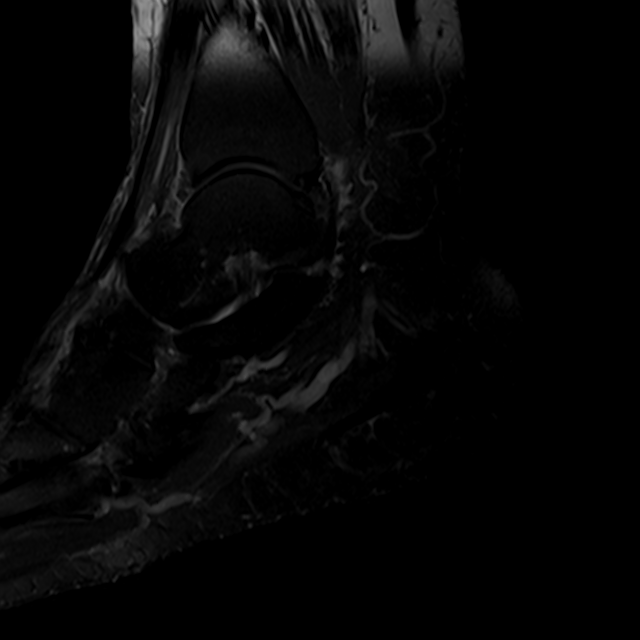
[im 18/22]
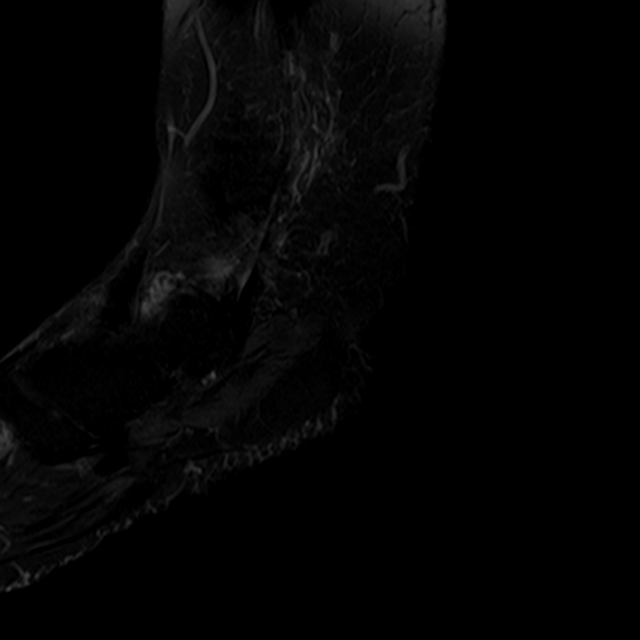

[Series 9: T2 fat-sat · coronal · 3.3mm · 0.23mm/px · 3 of 28 slices shown (3 of 3)]
[im 4/28]
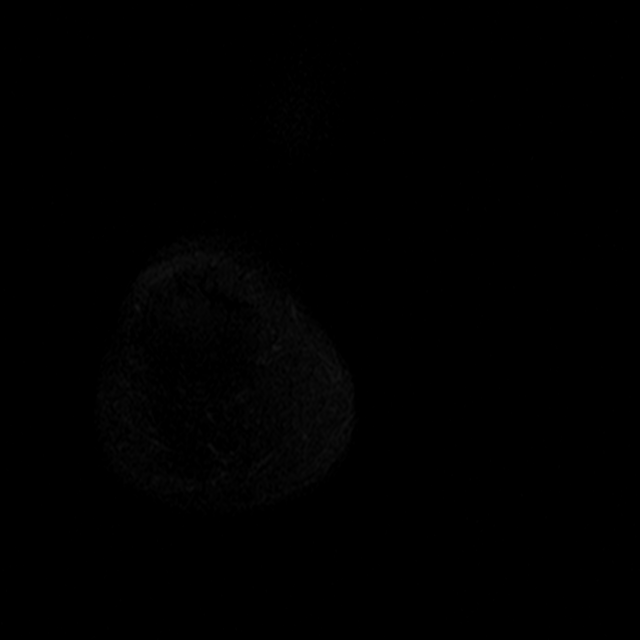
[im 14/28]
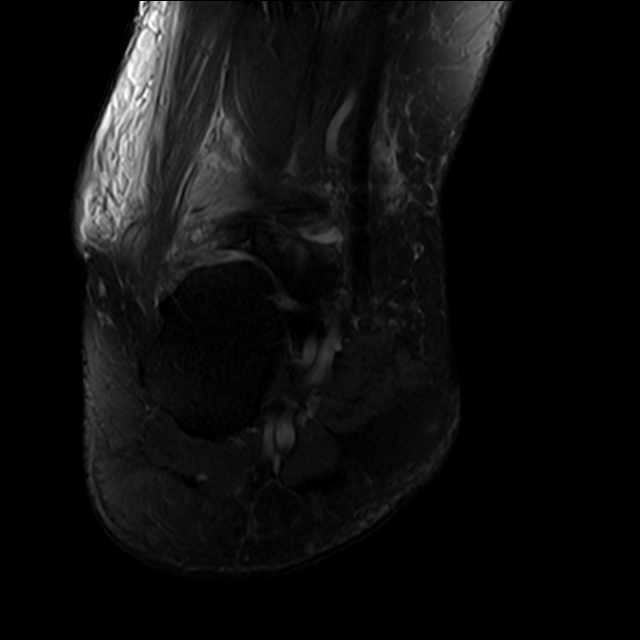
[im 24/28]
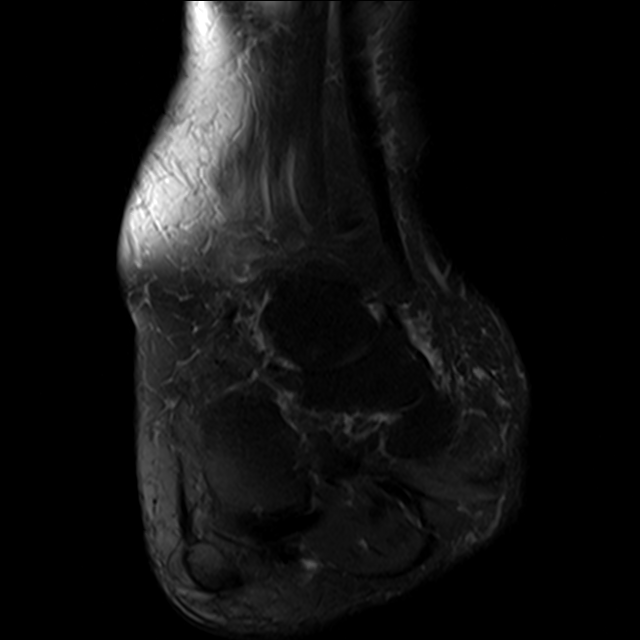

[12 of 40 positions shown; findings below may reference images not displayed]

FINDINGS: TENDONS

Peroneal: Unremarkable

Posteromedial: Mild distal tibialis posterior tendinopathy.

Anterior: Mild distal tibialis anterior tendinopathy.

Achilles: Mild distal Achilles tendinopathy. Achilles calcaneal spur
noted.

Plantar Fascia: Plantar calcaneal spur. No overt thickening of the
plantar fascia.

LIGAMENTS

Lateral: Thickened anterior inferior tibiofibular ligament on image
[DATE]. Torn anterior talofibular ligament with edema and synovitis
which may predispose to anterolateral impingement.

Medial: Accentuated T2 signal in the deep tibiotalar portion of the
deltoid ligament. Ill definition of portions of the tibiospring and
tibionavicular ligament. Prominently thickened in indistinct
superomedial portion of the spring ligament highly indistinct
medioplantar oblique and inferoplantar longitudinal portions of the
spring ligament, probably torn.

CARTILAGE

Ankle Joint: 4 mm non-fragmented osteochondral lesion along the
anteromedial portion of the talar dome, image [DATE]. Mild
degenerative chondral thinning in the tibiotalar joint with
fragmented spurring along the anterior and posterior distal tibial
rim.

Subtalar Joints/Sinus Tarsi: Mild degenerative spurring.

Bones: Small well-corticated ossicles below the medial malleolus.

Other: No supplemental non-categorized findings.
IMPRESSION: 1. Torn anterior talofibular ligament with edema and synovitis which
may predispose to anterolateral impingement.
2. Thickened anterior inferior tibiofibular ligament, possibly the
result of a prior tear or sprain.
3. Markedly thickened and indistinct superomedial portion of the
spring ligament with ill definition of the medioplantar oblique and
inferoplantar longitudinal portions of the spring ligament, favoring
tearing.
4. Mild distal tibialis posterior tendinopathy, correlate clinically
in assessing for tibialis posterior dysfunction.
5. High signal in the deep tibiotalar portion of the deltoid
ligament favoring sprain, with ill definition of tibiospring and
tibionavicular components likely from chronic tear.
6. 4 mm non-fragmented osteochondral lesion along the anteromedial
talar dome.
7. Mild distal tibialis anterior tendinopathy and mild distal
Achilles tendinopathy.

## 2019-02-06 ENCOUNTER — Telehealth: Payer: Self-pay | Admitting: *Deleted

## 2019-02-06 NOTE — Telephone Encounter (Signed)
-----   Message from Trula Slade, DPM sent at 02/06/2019 10:25 AM EDT ----- Val- I had discussed PT with her pending the MRI. Can you have her come back in to be seen prior to the PT. Please let her know there is old tear of an ankle ligament, she has some sprains as well as some chronic ligament tears. I would like to see her prior to PT to go over this with her. Thanks.

## 2019-02-06 NOTE — Telephone Encounter (Signed)
I informed pt of Dr. Leigh Aurora review of results and request for her to schedule to discuss results and treatment.

## 2019-02-13 ENCOUNTER — Inpatient Hospital Stay: Payer: Medicare HMO | Attending: Oncology

## 2019-02-13 ENCOUNTER — Other Ambulatory Visit: Payer: Self-pay

## 2019-02-13 ENCOUNTER — Encounter (HOSPITAL_COMMUNITY): Payer: Self-pay

## 2019-02-13 ENCOUNTER — Ambulatory Visit (HOSPITAL_COMMUNITY)
Admission: RE | Admit: 2019-02-13 | Discharge: 2019-02-13 | Disposition: A | Discharge status: Home / Self Care | Payer: Medicare HMO | Source: Ambulatory Visit | Attending: Oncology | Admitting: Oncology

## 2019-02-13 DIAGNOSIS — C50412 Malignant neoplasm of upper-outer quadrant of left female breast: Secondary | ICD-10-CM | POA: Diagnosis not present

## 2019-02-13 DIAGNOSIS — Z170 Estrogen receptor positive status [ER+]: Secondary | ICD-10-CM

## 2019-02-13 LAB — COMPREHENSIVE METABOLIC PANEL
ALT: 19 U/L (ref 0–44)
AST: 20 U/L (ref 15–41)
Albumin: 4.1 g/dL (ref 3.5–5.0)
Alkaline Phosphatase: 85 U/L (ref 38–126)
Anion gap: 9 (ref 5–15)
BUN: 13 mg/dL (ref 6–20)
CO2: 28 mmol/L (ref 22–32)
Calcium: 9.2 mg/dL (ref 8.9–10.3)
Chloride: 101 mmol/L (ref 98–111)
Creatinine, Ser: 0.92 mg/dL (ref 0.44–1.00)
GFR calc Af Amer: >60 mL/min (ref >60)
GFR calc non Af Amer: >60 mL/min (ref >60)
Glucose, Bld: 55 mg/dL — ABNORMAL LOW (ref 70–99)
Potassium: 3.4 mmol/L — ABNORMAL LOW (ref 3.5–5.1)
Sodium: 138 mmol/L (ref 135–145)
Total Bilirubin: 0.9 mg/dL (ref 0.3–1.2)
Total Protein: 7.3 g/dL (ref 6.5–8.1)

## 2019-02-13 LAB — CBC WITH DIFFERENTIAL/PLATELET
Abs Immature Granulocytes: 0.01 10*3/uL (ref 0.00–0.07)
Basophils Absolute: 0 10*3/uL (ref 0.0–0.1)
Basophils Relative: 0 %
Eosinophils Absolute: 0.1 10*3/uL (ref 0.0–0.5)
Eosinophils Relative: 2 %
HCT: 37.7 % (ref 36.0–46.0)
Hemoglobin: 12.3 g/dL (ref 12.0–15.0)
Immature Granulocytes: 0 %
Lymphocytes Relative: 62 %
Lymphs Abs: 3.8 10*3/uL (ref 0.7–4.0)
MCH: 28.3 pg (ref 26.0–34.0)
MCHC: 32.6 g/dL (ref 30.0–36.0)
MCV: 86.9 fL (ref 80.0–100.0)
Monocytes Absolute: 0.5 10*3/uL (ref 0.1–1.0)
Monocytes Relative: 7 %
Neutro Abs: 1.8 10*3/uL (ref 1.7–7.7)
Neutrophils Relative %: 29 %
Platelets: 247 10*3/uL (ref 150–400)
RBC: 4.34 MIL/uL (ref 3.87–5.11)
RDW: 13.4 % (ref 11.5–15.5)
WBC: 6.2 10*3/uL (ref 4.0–10.5)
nRBC: 0 % (ref 0.0–0.2)

## 2019-02-13 IMAGING — CT CT CHEST W/ CM
2 of 3 series · 14 of 36 positions shown, 17 images · IV contrast (OMNIPAQUE)
Comparison: 12/16/2017, 09/13/2017

CLINICAL DATA: Breast cancer, pulmonary metastatic disease
suspected, follow-up lung nodules

EXAM:
CT CHEST WITH CONTRAST
TECHNIQUE: Multidetector CT imaging of the chest was performed during
intravenous contrast administration.
CONTRAST:  75mL OMNIPAQUE IOHEXOL 300 MG/ML  SOLN

[Series 2: axial st · axial · 0.84mm/px · z∈[-290,-60]mm · 11 of 137 slices shown, 14 images]
[im 11/137  mediastinal]
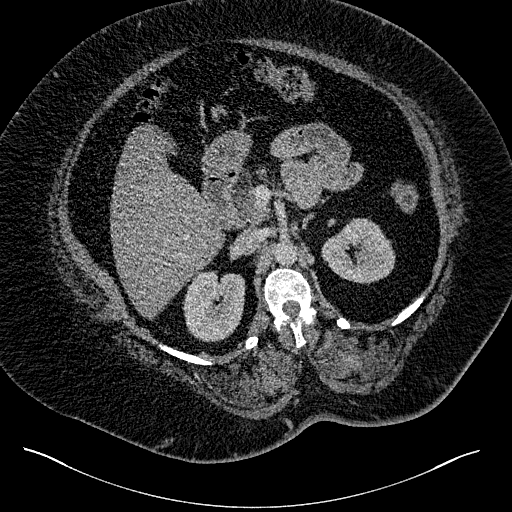
[im 11/137  lung]
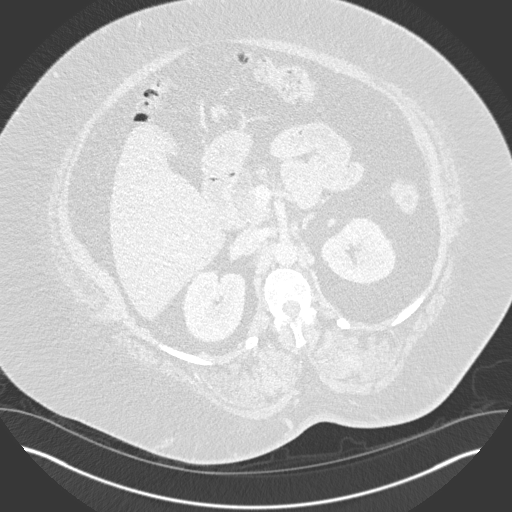
[im 21/137  lung]
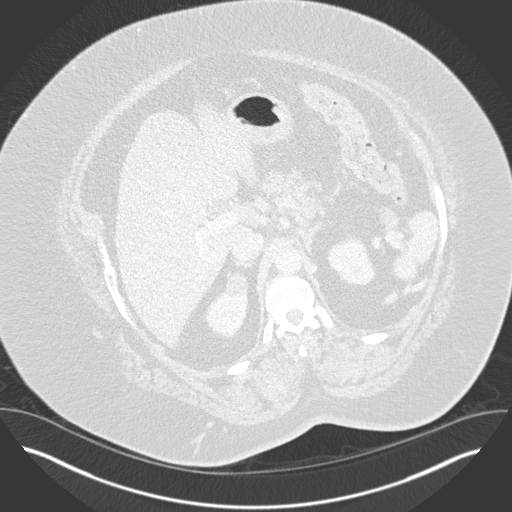
[im 31/137  lung]
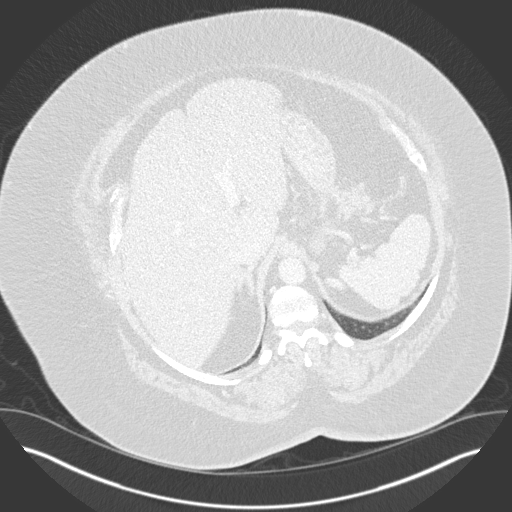
[im 46/137  lung]
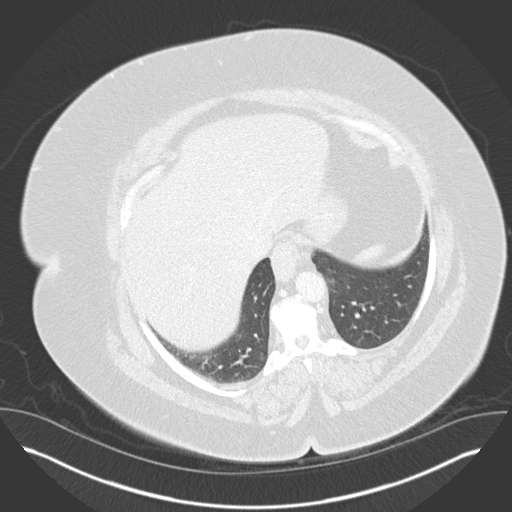
[im 56/137  mediastinal]
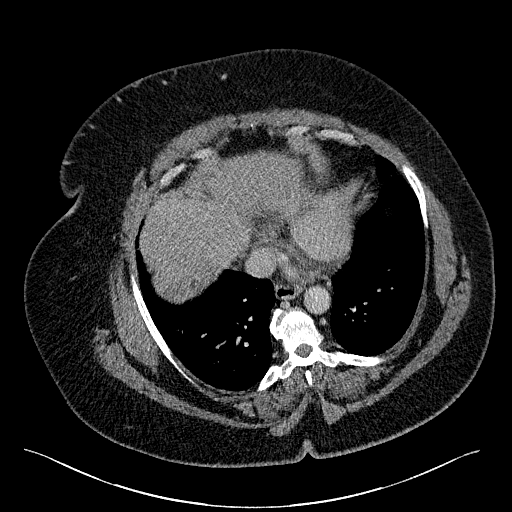
[im 56/137  lung]
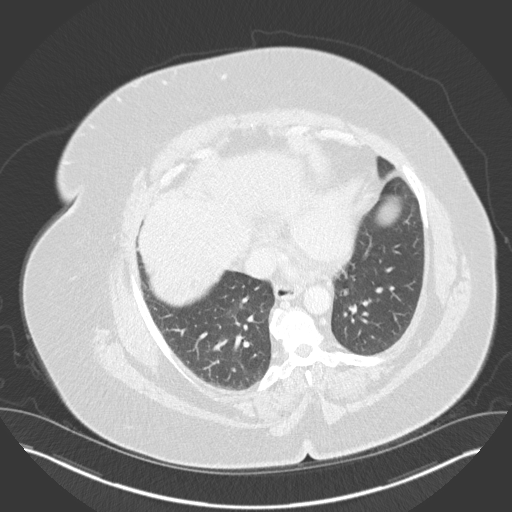
[im 71/137  lung]
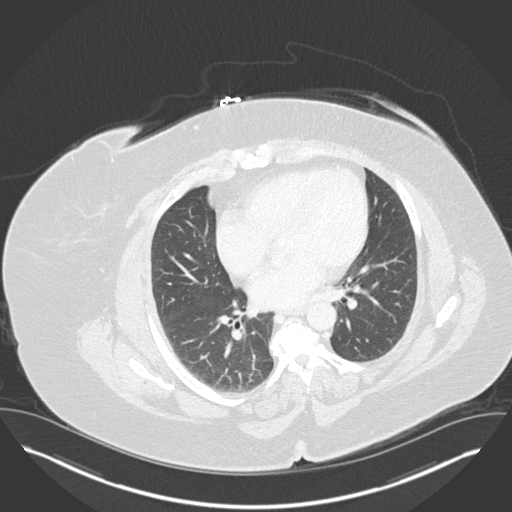
[im 81/137  lung]
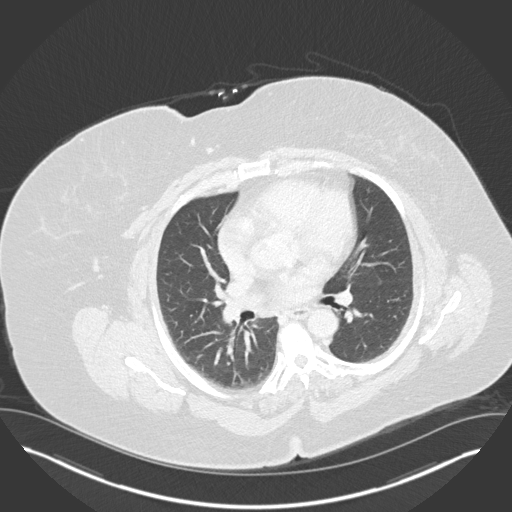
[im 91/137  lung]
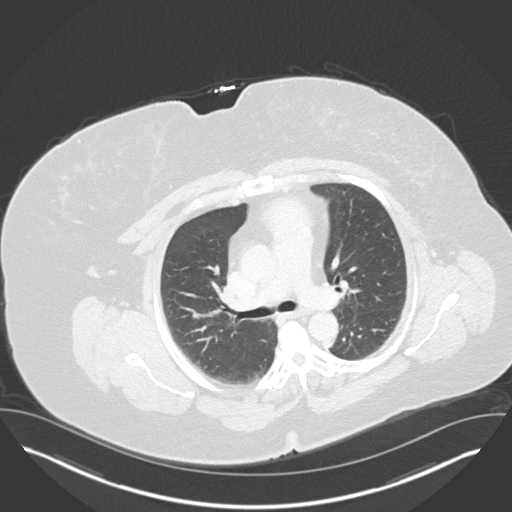
[im 106/137  mediastinal]
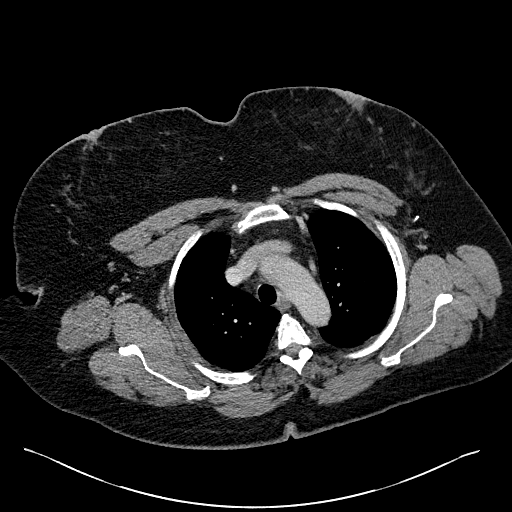
[im 106/137  lung]
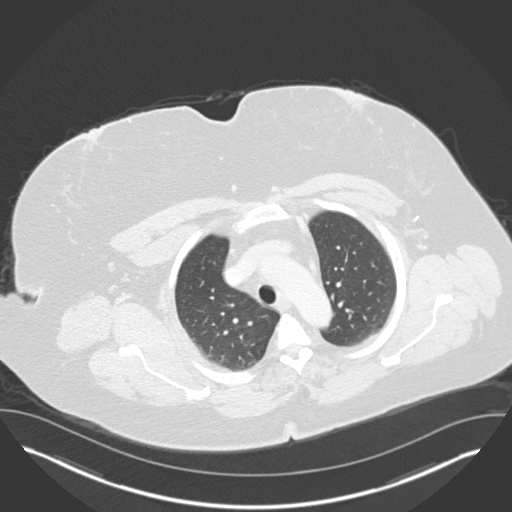
[im 116/137  lung]
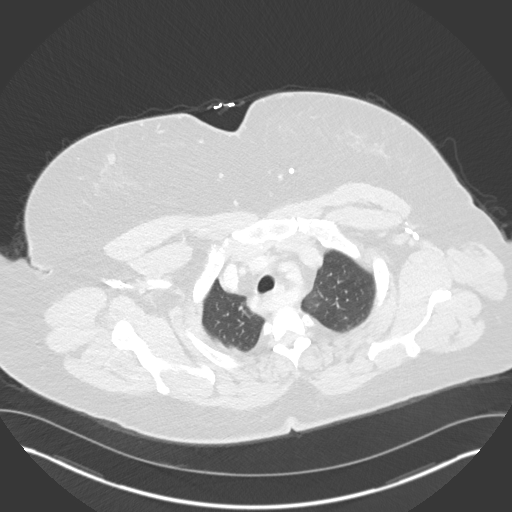
[im 126/137  lung]
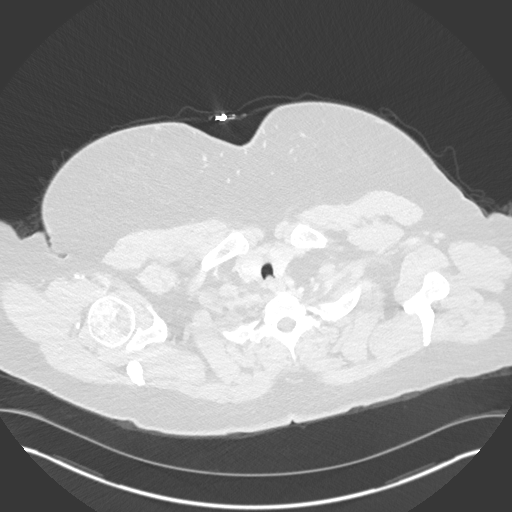

[Series 6: coronal · coronal · 0.54mm/px · 3 of 178 slices shown]
[im 36/178  lung]
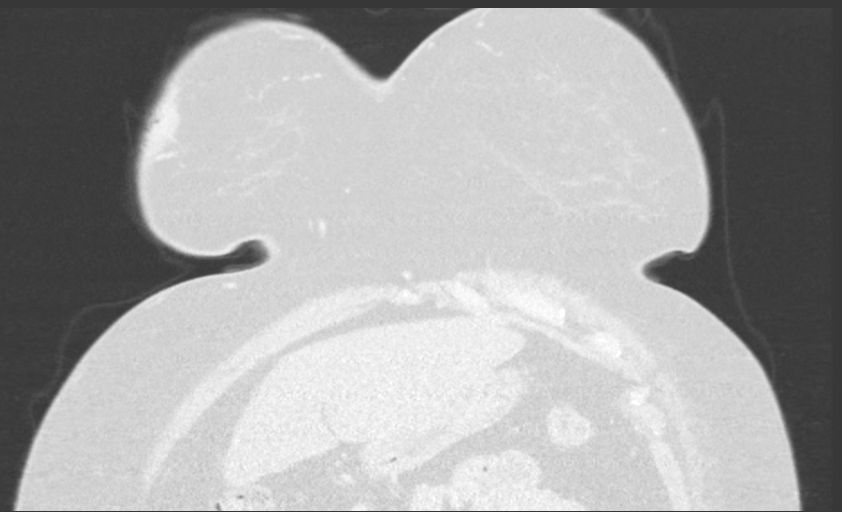
[im 71/178  lung]
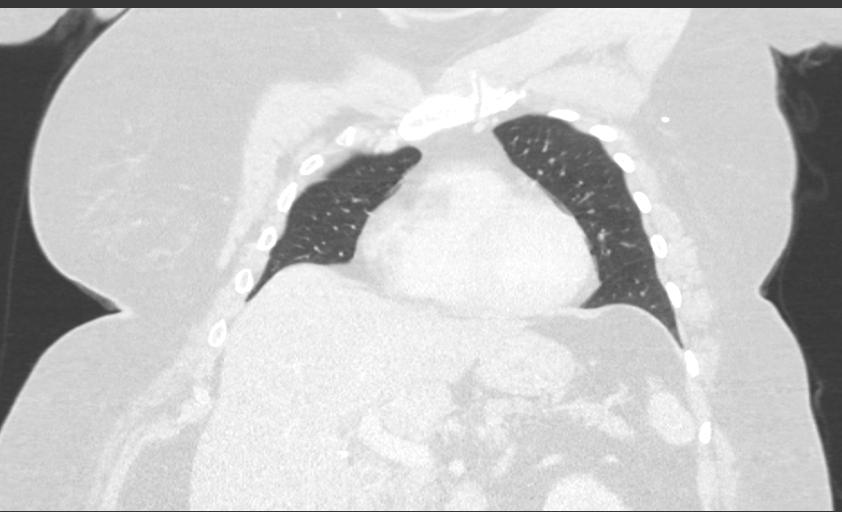
[im 107/178  lung]
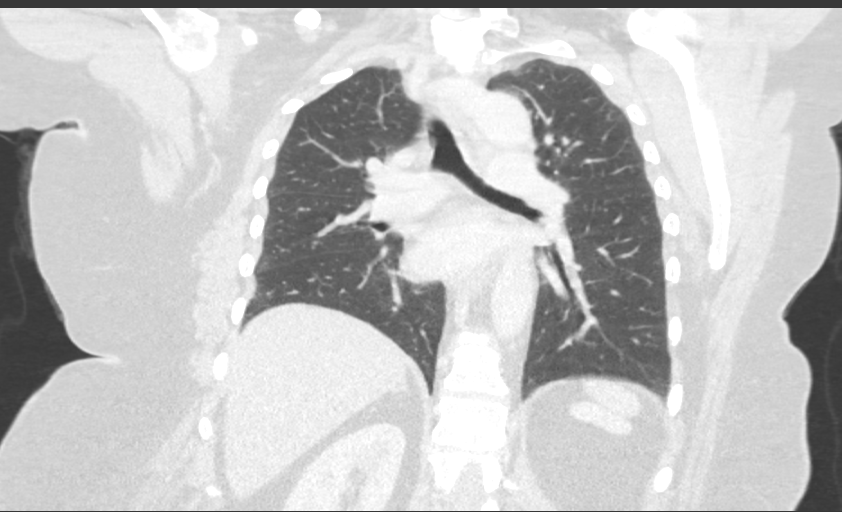

[14 of 36 positions shown; findings below may reference images not displayed]

FINDINGS: Cardiovascular: Incidental note of aberrant retroesophageal origin
of the right subclavian artery. Cardiomegaly. No pericardial
effusion.

Mediastinum/Nodes: No enlarged mediastinal, hilar, or axillary lymph
nodes. Thyroid gland, trachea, and esophagus demonstrate no
significant findings.

Lungs/Pleura: No change in an 8 mm pulmonary nodule of the lingula
(series 7, image 67) as well as a 3 mm nodule of the dependent left
lower lobe (series 5, image 70). No pleural effusion or
pneumothorax.

Upper Abdomen: No acute abnormality. Hepatic steatosis. Status post
cholecystectomy.

Musculoskeletal: No chest wall mass or suspicious bone lesions
identified. Surgical clips in the left breast and left axilla.
IMPRESSION: 1. No change in an 8 mm pulmonary nodule of the lingula (series 7,
image 67) as well as a 3 mm nodule of the dependent left lower lobe
(series 5, image 70). Given the significant interval stability
dating back to 09/13/2017, these are likely benign sequelae of
infection or inflammation. Attention on follow-up as indicated by
clinical oncologic protocol.

2.  Cardiomegaly.

3.  Hepatic steatosis.

## 2019-02-13 MED ORDER — IOHEXOL 300 MG/ML  SOLN
75.0000 mL | Freq: Once | INTRAMUSCULAR | Status: AC | PRN
  Administered 2019-02-13: 75 mL via INTRAVENOUS

## 2019-02-13 MED ORDER — SODIUM CHLORIDE (PF) 0.9 % IJ SOLN
INTRAMUSCULAR | Status: AC
  Filled 2019-02-13: qty 50

## 2019-02-17 ENCOUNTER — Encounter: Payer: Self-pay | Admitting: Registered Nurse

## 2019-02-17 ENCOUNTER — Other Ambulatory Visit: Payer: Self-pay

## 2019-02-17 ENCOUNTER — Encounter: Payer: Medicare HMO | Attending: Physical Medicine & Rehabilitation | Admitting: Registered Nurse

## 2019-02-17 VITALS — BP 121/83 | HR 80 | Temp 97.7°F | Ht 63.0 in | Wt 293.0 lb

## 2019-02-17 DIAGNOSIS — M545 Low back pain: Secondary | ICD-10-CM | POA: Insufficient documentation

## 2019-02-17 DIAGNOSIS — M48061 Spinal stenosis, lumbar region without neurogenic claudication: Secondary | ICD-10-CM | POA: Diagnosis not present

## 2019-02-17 DIAGNOSIS — M961 Postlaminectomy syndrome, not elsewhere classified: Secondary | ICD-10-CM | POA: Diagnosis not present

## 2019-02-17 DIAGNOSIS — Z9889 Other specified postprocedural states: Secondary | ICD-10-CM | POA: Insufficient documentation

## 2019-02-17 DIAGNOSIS — Z981 Arthrodesis status: Secondary | ICD-10-CM | POA: Diagnosis not present

## 2019-02-17 DIAGNOSIS — G8929 Other chronic pain: Secondary | ICD-10-CM | POA: Insufficient documentation

## 2019-02-17 DIAGNOSIS — K219 Gastro-esophageal reflux disease without esophagitis: Secondary | ICD-10-CM | POA: Insufficient documentation

## 2019-02-17 DIAGNOSIS — M797 Fibromyalgia: Secondary | ICD-10-CM | POA: Insufficient documentation

## 2019-02-17 DIAGNOSIS — F329 Major depressive disorder, single episode, unspecified: Secondary | ICD-10-CM | POA: Insufficient documentation

## 2019-02-17 DIAGNOSIS — G4733 Obstructive sleep apnea (adult) (pediatric): Secondary | ICD-10-CM | POA: Diagnosis not present

## 2019-02-17 DIAGNOSIS — M5416 Radiculopathy, lumbar region: Secondary | ICD-10-CM

## 2019-02-17 DIAGNOSIS — M7062 Trochanteric bursitis, left hip: Secondary | ICD-10-CM

## 2019-02-17 DIAGNOSIS — M7061 Trochanteric bursitis, right hip: Secondary | ICD-10-CM

## 2019-02-17 DIAGNOSIS — M1712 Unilateral primary osteoarthritis, left knee: Secondary | ICD-10-CM

## 2019-02-17 DIAGNOSIS — G8918 Other acute postprocedural pain: Secondary | ICD-10-CM | POA: Insufficient documentation

## 2019-02-17 DIAGNOSIS — G894 Chronic pain syndrome: Secondary | ICD-10-CM

## 2019-02-17 DIAGNOSIS — M1711 Unilateral primary osteoarthritis, right knee: Secondary | ICD-10-CM

## 2019-02-17 DIAGNOSIS — Z5181 Encounter for therapeutic drug level monitoring: Secondary | ICD-10-CM

## 2019-02-17 DIAGNOSIS — Z853 Personal history of malignant neoplasm of breast: Secondary | ICD-10-CM | POA: Insufficient documentation

## 2019-02-17 DIAGNOSIS — M62838 Other muscle spasm: Secondary | ICD-10-CM

## 2019-02-17 MED ORDER — HYDROCODONE-ACETAMINOPHEN 7.5-325 MG PO TABS: ORAL_TABLET | ORAL | 0 refills | Status: DC

## 2019-02-17 NOTE — Progress Notes (Signed)
Subjective:    Patient ID: Kelsey Wilson, female    DOB: [birth year], 58 y.o.   MRN: [id number]  HPI: Kelsey Wilson is a 58 y.o. female who returns for follow up appointment for chronic pain and medication refill. She states her pain is located in her lower back radiating into her buttocks and right hip and bilateral knee pain. She rates her pain 7. Her current exercise regime is walking.   Ms. Fruchter Morphine equivalent is 22.30  MME.  Lst Oral Swab was Performed on 06/06/2018, it was consistent.    Pain Inventory Average Pain 8 Pain Right Now 7 My pain is aching  In the last 24 hours, has pain interfered with the following? General activity 7 Relation with others 7 Enjoyment of life 7 What TIME of day is your pain at its worst? night Sleep (in general) Fair  Pain is worse with: walking, bending and sitting Pain improves with: rest and heat/ice Relief from Meds: 2  Mobility walk without assistance ability to climb steps?  yes do you drive?  yes  Function I need assistance with the following:  meal prep, household duties and shopping  Neuro/Psych numbness tingling spasms  Prior Studies CT/MRI  Physicians involved in your care Any changes since last visit?  no   Family History  Problem Relation Age of Onset  . Hypertension Mother   . Diabetes Mother   . Hypertension Father   . Diabetes Father   . Cancer Paternal Grandmother        unknown  . Colon cancer Neg Hx    Social History   Socioeconomic History  . Marital status: Single    Spouse name: Not on file  . Number of children: 3  . Years of education: Not on file  . Highest education level: Not on file  Occupational History  . Not on file  Social Needs  . Financial resource strain: Not on file  . Food insecurity    Worry: Not on file    Inability: Not on file  . Transportation needs    Medical: Not on file    Non-medical: Not on file  Tobacco Use  . Smoking status: Never Smoker   . Smokeless tobacco: Never Used  Substance and Sexual Activity  . Alcohol use: No    Alcohol/week: 0.0 standard drinks  . Drug use: No  . Sexual activity: Never    Birth control/protection: Post-menopausal, Surgical  Lifestyle  . Physical activity    Days per week: Not on file    Minutes per session: Not on file  . Stress: Not on file  Relationships  . Social Herbalist on phone: Not on file    Gets together: Not on file    Attends religious service: Not on file    Active member of club or organization: Not on file    Attends meetings of clubs or organizations: Not on file    Relationship status: Not on file  Other Topics Concern  . Not on file  Social History Narrative  . Not on file   Past Surgical History:  Procedure Laterality Date  . ABDOMINAL HYSTERECTOMY     still has ovaries  . APPENDECTOMY    . AXILLARY LYMPH NODE DISSECTION  11/28/2011   Procedure: AXILLARY LYMPH NODE DISSECTION;  Surgeon: Edward Jolly, MD;  Location: [place];  Service: General;  Laterality: Left;  . BREAST LUMPECTOMY Left 11/28/2011   Malignant  .  BREAST SURGERY Left 2013  . CARPAL TUNNEL RELEASE     Bilateral  . CESAREAN SECTION     pt. has had 3  . CHOLECYSTECTOMY    . COLONOSCOPY    . EYE SURGERY Bilateral    cataract removal  . KNEE SURGERY     Left Knee  . LAMINECTOMY WITH POSTERIOR LATERAL ARTHRODESIS LEVEL 1 N/A 11/29/2017   Procedure: Posterior Lateral Fusion - Lumbar Four-Lumbar Five, removal and replacement of hardware, Laminectomy - Lumbar Four-Lumbar Five;  Surgeon: Eustace Moore, MD;  Location[facility];  Service: Neurosurgery;  Laterality: N/A;  . LAMINECTOMY WITH POSTERIOR LATERAL ARTHRODESIS LEVEL 2 N/A 06/07/2017   Procedure: Posterior Lateral Fusion Lumbar Three-Four and Transforaminal Interbody Fusion Lumbar Four-Five with Segmental  Pedicle Screw Fixation;  Surgeon: Eustace Moore, MD;  Location: [place];  Service: Neurosurgery;  Laterality: N/A;  Posterior  Lateral Fusion Lumbar Three-Four and Transforaminal Interbody Fusion Lumbar Four-Five with Segmental  Pedicle Screw Fixation   . LUMBAR FUSION  06/07/2017   POST  . LUMBAR LAMINECTOMY/DECOMPRESSION MICRODISCECTOMY Bilateral 01/27/2016   Procedure: Laminectomy and Foraminotomy - Lumbar four -Lumbar five - bilateral- on-lay noninstrumented fusion;  Surgeon: Eustace Moore, MD;  Location: [facility];  Service: Neurosurgery;  Laterality: Bilateral;  . MULTIPLE EXTRACTIONS WITH ALVEOLOPLASTY N/A 05/11/2016   Procedure: EXTRACTION OF TEETH EIGHTEEN, TWENTY AND TWENTY- NINE;  REMOVAL OF MANDIBULAR TORUS AND EXOSTOSIS;  Surgeon: Diona Browner, DDS;  Location: [place];  Service: Oral Surgery;  Laterality: N/A;  . PORTACATH PLACEMENT  06/18/2011   Procedure: INSERTION PORT-A-CATH;  Surgeon: Edward Jolly, MD;  Location: [place];  Service: General;  Laterality: Right;  right subclavian  . removal portacath  2014  . spur     Apex spur on both big toes  . TOE SURGERY Bilateral   . TONSILLECTOMY    . TOTAL KNEE ARTHROPLASTY Left 09/13/2014  . TOTAL KNEE ARTHROPLASTY Left 09/13/2014   Procedure: LEFT TOTAL KNEE ARTHROPLASTY;  Surgeon: Rod Can, MD;  Location: [place];  Service: Orthopedics;  Laterality: Left;  . TOTAL KNEE ARTHROPLASTY Right 03/10/2015   Procedure: RIGHT TOTAL KNEE ARTHROPLASTY;  Surgeon: Rod Can, MD;  Location: [facility];  Service: Orthopedics;  Laterality: Right;   Past Medical History:  Diagnosis Date  . Anemia   . Anxiety   . Arthritis   . Breast cancer ([place]) 2010   T3N1 invasive ductal carcinoma left breast.Takes Arimidex daily  . Bursitis   . Carpal tunnel syndrome   . Chronic back pain    stenosis  . Constipation    takes Colace daily  . Depression    takes Benzotropine daily  . Diverticulitis of colon   . Dyspnea    daily when walking for over 1 yr.  . Fibromyalgia 08/2012  . GERD (gastroesophageal reflux disease)    takes Dexilant daily  .  Hemorrhoid   . History of blood transfusion    no abnormal reaction noted  . History of colon polyps    benign  . History of shingles   . Joint pain   . Joint swelling   . Night muscle spasms    takes Flexeril nightly as needed  . Nocturia   . OSA (obstructive sleep apnea)   . OSA on CPAP   . Peripheral edema    takes Furosemide.Just started 01/18/16  . Peripheral neuropathy    takes Lyrica daily  . Personal history of chemotherapy  2013  . Personal history of radiation therapy    2013  . Pneumonia    hx of-2015  . Pseudoarthrosis of lumbar spine   . Seasonal allergies    takes Singulair nightly  . SOB (shortness of breath) on exertion    rarely with exertion  . 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 Alphonzo Grieve)   BP 121/83   Pulse 80   Temp 97.7 F (36.5 C)   Ht 5' 3"  (1.6 m)   Wt 293 lb (132.9 kg)   SpO2 96%   BMI 51.90 kg/m   Opioid Risk Score:   Fall Risk Score:  `1  Depression screen PHQ 2/9  Depression screen [facility] 2/9 08/04/2018 06/06/2018 04/04/2018 01/13/2018 12/13/2017 06/21/2017 03/07/2017  Decreased Interest 0 0 0 3 3 1 2   Down, Depressed, Hopeless 0 0 0 3 3 1 2   PHQ - 2 Score 0 0 0 6 6 2 4   Altered sleeping - - - - - - 0  Tired, decreased energy - - - - - - 3  Change in appetite - - - - - - 3  Feeling bad or failure about yourself  - - - - - - 2  Trouble concentrating - - - - - - 3  Moving slowly or fidgety/restless - - - - - - 3  Suicidal thoughts - - - - - - 0  PHQ-9 Score - - - - - - 18  Difficult doing work/chores - - - - - - Extremely dIfficult  Some recent data might be hidden     Review of Systems  Constitutional: Negative.   HENT: Negative.   Eyes: Negative.   Respiratory: Negative.   Cardiovascular: Negative.   Gastrointestinal: Negative.   Endocrine: Negative.   Genitourinary: Negative.   Musculoskeletal: Negative.    Skin: Negative.   Allergic/Immunologic: Negative.   Neurological: Negative.   Hematological: Negative.   Psychiatric/Behavioral: Negative.   All other systems reviewed and are negative.      Objective:   Physical Exam Vitals signs and nursing note reviewed.  Constitutional:      Appearance: Normal appearance.  Neck:     Musculoskeletal: Normal range of motion and neck supple.  Cardiovascular:     Rate and Rhythm: Normal rate and regular rhythm.     Pulses: Normal pulses.     Heart sounds: Normal heart sounds.  Pulmonary:     Effort: Pulmonary effort is normal.     Breath sounds: Normal breath sounds.  Musculoskeletal:     Comments: Normal Muscle Bulk and Muscle Testing Reveals:  Upper Extremities: Full ROM and Muscle Strength 5/5  Lumbar Hypersensitivity Bilateral Greater Trochanter Tenderness Lower Extremities: Full ROM and Muscle Strength 5/5 Arises from Table Slowly Antalgic  Gait   Skin:    General: Skin is warm and dry.  Neurological:     Mental Status: She is alert and oriented to person, place, and time.  Psychiatric:        Mood and Affect: Mood normal.        Behavior: Behavior normal.           Assessment & Plan:  1. Lumbar Postlaminectomy/ S/P Lumbar Fusion:Spinal Stenosis: S/PPosterior Lateral Fusion L-4-L-5 removal and replacement of hardware, Laminectomy L4-L5 by Dr. Ronnald Ramp on 12/29/2017.  Continue HEP as Tolerated and Continue current medication regime. Continue with Slow Weaning:Hydrocodone7.5/325 mg one  tablet every 6 hours as needed#110.02/17/2019 2. Lumbar Radiculitis: Continue current medication regime with Lyrica.02/17/2019 3. Fibromyalgia:ContinueHEP as Tolerated.Continue Current Medication Regimen with Lyrica.02/17/2019 4.RightGreater Trochanter Bursitis: Continue to Alternate Ice and Heat Therapy.02/17/2019. 5. Muscle Spasm:Continue:Flexeril..Continue to Monitor.02/17/2019. 6. Chronic Pain Syndrome: Continue Current  medication regimen and HEP as Tolerated. Continue to Monitor.02/17/2019. 7. Bilateral OA of Bilateral Knees: Orthopedist following.Continue to Monitor.02/17/2019. 8. Bilateral Hand Pain:No complaints today.Continue to Monitor/ ? OA: Continue to Monitor.02/17/2019 9. Cervicalgia:No complaints today.Continue to alternate Heat/Ice Therapy. Continue HEP as Tolerated.02/17/2019. 10. Chronic Bilateral Shoulders:No complaints Today.Continue to alternate Ice/ Heat therapy and HEP as Tolerated.02/17/2019. 11. Bilateral Ankle Pain: Podiatry Following: Continue to Monitor. 02/17/2019   21mnutes of Face to Face Patient Care time was spent during this visit. All questions encouraged and answered,   F/U in 1 month

## 2019-02-19 ENCOUNTER — Other Ambulatory Visit: Payer: Self-pay

## 2019-02-19 ENCOUNTER — Ambulatory Visit (INDEPENDENT_AMBULATORY_CARE_PROVIDER_SITE_OTHER): Payer: Medicare HMO | Admitting: Podiatry

## 2019-02-19 ENCOUNTER — Ambulatory Visit: Payer: Medicare HMO | Admitting: Orthotics

## 2019-02-19 DIAGNOSIS — M2141 Flat foot [pes planus] (acquired), right foot: Secondary | ICD-10-CM

## 2019-02-19 DIAGNOSIS — E1149 Type 2 diabetes mellitus with other diabetic neurological complication: Secondary | ICD-10-CM

## 2019-02-19 DIAGNOSIS — M76829 Posterior tibial tendinitis, unspecified leg: Secondary | ICD-10-CM | POA: Diagnosis not present

## 2019-02-19 DIAGNOSIS — Z794 Long term (current) use of insulin: Secondary | ICD-10-CM

## 2019-02-19 DIAGNOSIS — M2142 Flat foot [pes planus] (acquired), left foot: Secondary | ICD-10-CM

## 2019-02-19 DIAGNOSIS — M779 Enthesopathy, unspecified: Secondary | ICD-10-CM

## 2019-02-19 DIAGNOSIS — E1340 Other specified diabetes mellitus with diabetic neuropathy, unspecified: Secondary | ICD-10-CM

## 2019-02-19 NOTE — Progress Notes (Signed)
Subjective: 58 year old female presents the office today for evaluation of right ankle pain, tendinitis.  She states that overall she is doing at the same.  She was doing physical therapy for her knee and this did aggravate her ankle some.  She is also a diabetic shoes.  She is interested in physical therapy as well.  No recent injury.  No other concerns.Denies any systemic complaints such as fevers, chills, nausea, vomiting. No acute changes since last appointment, and no other complaints at this time.   Objective: AAO x3, NAD DP/PT pulses palpable bilaterally, CRT less than 3 seconds There is generalized tenderness both the medial lateral aspects of the ankle with no specific area pinpoint tenderness.  Modest, with ankle joint range of motion.  No restriction.  Mild edema to the ankle there is no erythema or warmth.  Decreased medial arch height No open lesions or pre-ulcerative lesions.  No pain with calf compression, swelling, warmth, erythema  MRI 02/05/2019 IMPRESSION: 1. Torn anterior talofibular ligament with edema and synovitis which may predispose to anterolateral impingement. 2. Thickened anterior inferior tibiofibular ligament, possibly the result of a prior tear or sprain. 3. Markedly thickened and indistinct superomedial portion of the spring ligament with ill definition of the medioplantar oblique and inferoplantar longitudinal portions of the spring ligament, favoring tearing. 4. Mild distal tibialis posterior tendinopathy, correlate clinically in assessing for tibialis posterior dysfunction. 5. High signal in the deep tibiotalar portion of the deltoid ligament favoring sprain, with ill definition of tibiospring and tibionavicular components likely from chronic tear. 6. 4 mm non-fragmented osteochondral lesion along the anteromedial talar dome. 7. Mild distal tibialis anterior tendinopathy and mild distal Achilles tendinopathy.   Assessment: Right chronic ankle pain,  denies/ligamentous injury  Plan: -All treatment options discussed with the patient including all alternatives, risks, complications.  -I reviewed the MRI with her.  I think some of the issues are overuse due to her flatfoot pulse from prior injury.  We will start physical therapy and prescription for [facility] was written today.  Also she is interested in diabetic measure for this as well. -Patient encouraged to call the office with any questions, concerns, change in symptoms.

## 2019-02-19 NOTE — Progress Notes (Signed)

## 2019-03-03 ENCOUNTER — Ambulatory Visit (INDEPENDENT_AMBULATORY_CARE_PROVIDER_SITE_OTHER): Payer: Medicare HMO | Admitting: Internal Medicine

## 2019-03-03 ENCOUNTER — Other Ambulatory Visit: Payer: Self-pay

## 2019-03-03 ENCOUNTER — Encounter: Payer: Self-pay | Admitting: Internal Medicine

## 2019-03-03 VITALS — BP 122/60 | HR 91 | Temp 97.6°F | Ht 63.0 in | Wt 294.2 lb

## 2019-03-03 DIAGNOSIS — G4700 Insomnia, unspecified: Secondary | ICD-10-CM | POA: Insufficient documentation

## 2019-03-03 DIAGNOSIS — J4530 Mild persistent asthma, uncomplicated: Secondary | ICD-10-CM

## 2019-03-03 DIAGNOSIS — R911 Solitary pulmonary nodule: Secondary | ICD-10-CM | POA: Diagnosis not present

## 2019-03-03 DIAGNOSIS — G4733 Obstructive sleep apnea (adult) (pediatric): Secondary | ICD-10-CM | POA: Diagnosis not present

## 2019-03-03 DIAGNOSIS — F5101 Primary insomnia: Secondary | ICD-10-CM | POA: Diagnosis not present

## 2019-03-03 MED ORDER — TEMAZEPAM 15 MG PO CAPS: 15.0000 mg | ORAL_CAPSULE | Freq: Every evening | ORAL | 5 refills | Status: DC | PRN

## 2019-03-03 MED ORDER — ANORO ELLIPTA 62.5-25 MCG/INH IN AEPB: 1.0000 | INHALATION_SPRAY | Freq: Every day | RESPIRATORY_TRACT | 12 refills | Status: AC

## 2019-03-03 NOTE — Assessment & Plan Note (Signed)
She is going to need a sleep aid for awhile, as she adjusts to CPAP Considered Trazodone, but it interacts with her Cymbalta. Plan- Temazepam with discussion

## 2019-03-03 NOTE — Assessment & Plan Note (Signed)
We discussed sleep hygiene, management of OSA, and importance of weight and alert driving. Plan- start CPAP auto 5-15

## 2019-03-03 NOTE — Progress Notes (Signed)
HPI F never smoker followed for OSA,, Asthma, lung nodules, complicated by DM2, hx Breast cancer L in remission, HBP HST 01/30/2019- AHI 23.7/ hr, desaturation to 71%, body weight 293 lbs  ------------------------------------------------------------------------------------  10/23/2018- 57 yoF never smoker for pulmonary evaluation with hx of wheezing dyspnea and obstructive sleep apnea. Lives w daughter. -----referred by Dr. Alphonzo Grieve (PCP) for sleep apnea; split night study 07/23/2016; formely on CPAP, however, states her insurance withdrew it d/t lack of usage; pt is not currently on CPAP, states she is restless and "up and down" Medical problem list includes DM2, hx Breast cancer L in remission, HBP She had previously declined use of an inhaler because she didn't like the taste.  NPSG 07/23/2016- AAHI 116.6/ hr, desaturation to 72% CT chest ordered 1o/10/19 for lung nodules w concern mets after breast cancer. Never done. CT chest 12/16/17- Dr Jana Hakim IMPRESSION: 1. Previously noted nodules in the left lung are stable compared to the prior study. This is reassuring, however, close attention on follow-up studies is recommended to ensure continued stability. Repeat noncontrast chest CT is recommended in 6-9 months to ensure continued stability. 2. Mild cardiomegaly. Body weight today 293 lbs, room air O2 sat 97% at rest Feels squirmy without pain or awareness of dyspnea lying in bed. Daytime sleepiness, dozes. Snores. Naps many days.  ENT+ tonsils.Not aware of lung disease but hears some wheeze with exertion. Tried an inhaler but didn't like the taste. Frequent dry cough- not aware at night.  Seasonal allergic rhinitis - not  Bad. Easy DOE w ADLs, but not recent acute exacerbation. One remote pneumonia.  Had L lumpectomy and XRT. Aware of lung nodule, for f/u CT per Dr Jana Hakim.   03/03/2019- 19 yoF never smoker followed for OSA,, Asthma, lung nodules, complicated by DM2, hx Breast cancer L in  remission, HBP HST 01/30/2019- AHI 23.7/ hr, desaturation to 71%, body weight 293 lbs Body weight today 294 lbs -----HST 11/07/2018, pt reports usual DOE, needs prescription for Anoro Anoro, Singulair,  Restless sleeper, agrees a sleep med would help while she adjusts to CPAP. Has sometimes awakened coughing- not cleary described. I educated her to watch for reflux.  She declines flu vax. Anoro sample helped breathing- asks for Rx- discussed.  CT chest 02/13/2019- followed by Oncology IMPRESSION: 1. No change in an 8 mm pulmonary nodule of the lingula (series 7, image 67) as well as a 3 mm nodule of the dependent left lower lobe (series 5, image 70). Given the significant interval stability dating back to 09/13/2017, these are likely benign sequelae of infection or inflammation. Attention on follow-up as indicated by clinical oncologic protocol. 2.  Cardiomegaly 3.  Hepatic steatosis.  ROS-see HPI   + = positive Constitutional:    weight loss, night sweats, fevers, chills, fatigue, lassitude. HEENT:    headaches, +difficulty swallowing, +tooth/dental problems, sore throat,       +sneezing,+ itching,+ ear ache, nasal congestion, post nasal drip, snoring CV:    chest pain, orthopnea, PND, +swelling in lower extremities, anasarca,                                  dizziness, palpitations Resp:   +shortness of breath with exertion or at rest.                productive cough,   non-productive cough, coughing up of blood.  change in color of mucus.  wheezing.   Skin:    rash or lesions. GI:  No-   heartburn, indigestion, +abdominal pain, nausea, vomiting, diarrhea,                 change in bowel habits, loss of appetite GU: dysuria, change in color of urine, no urgency or frequency.   flank pain. MS:   +joint pain, stiffness, decreased range of motion, back pain. Neuro-     nothing unusual Psych:  change in mood or affect.  depression or +anxiety.   memory loss.  OBJ-  Physical Exam General- Alert, Oriented, Affect-appropriate, Distress- none acute, + morbidly obese Skin- rash-none, lesions- none, excoriation- none Lymphadenopathy- none Head- atraumatic            Eyes- Gross vision intact, PERRLA, conjunctivae and secretions clear            Ears- Hearing, canals-normal            Nose- Clear, no-Septal dev, mucus, polyps, erosion, perforation             Throat- Mallampati III , mucosa clear , drainage- none, tonsils- atrophic, + teeth Neck- flexible , trachea midline, no stridor , thyroid nl, carotid no bruit Chest - symmetrical excursion , unlabored           Heart/CV- RRR , no murmur , no gallop  , no rub, nl s1 s2                           - JVD- none , edema- none, stasis changes- none, varices- none           Lung- clear to P&A, wheeze- none, cough- none , dullness-none, rub- none           Chest wall- +Scar from port access Abd-  Br/ Gen/ Rectal- Not done, not indicated Extrem- cyanosis- none, clubbing, none, atrophy- none, strength- nl Neuro- grossly intact to observation

## 2019-03-03 NOTE — Assessment & Plan Note (Signed)
Oncology ordered this CT and is following with hx breast cancer.

## 2019-03-03 NOTE — Assessment & Plan Note (Signed)
Mild persistent uncomplicated. Plan- Script for [employer]

## 2019-03-03 NOTE — Patient Instructions (Signed)
Order- new DME new CPAP auto 5-15, mask of choice, humidifier, supplies, Air/view/ card  Script sent refilling Anoro inhaler  Script sent for temazepam to take for sleep if needed  Please call as needed

## 2019-03-05 ENCOUNTER — Other Ambulatory Visit: Payer: Self-pay

## 2019-03-05 ENCOUNTER — Inpatient Hospital Stay: Payer: Medicare HMO | Attending: Oncology

## 2019-03-05 DIAGNOSIS — N951 Menopausal and female climacteric states: Secondary | ICD-10-CM | POA: Diagnosis not present

## 2019-03-05 DIAGNOSIS — R918 Other nonspecific abnormal finding of lung field: Secondary | ICD-10-CM | POA: Insufficient documentation

## 2019-03-05 DIAGNOSIS — M8580 Other specified disorders of bone density and structure, unspecified site: Secondary | ICD-10-CM | POA: Insufficient documentation

## 2019-03-05 DIAGNOSIS — C50412 Malignant neoplasm of upper-outer quadrant of left female breast: Secondary | ICD-10-CM | POA: Insufficient documentation

## 2019-03-05 DIAGNOSIS — N898 Other specified noninflammatory disorders of vagina: Secondary | ICD-10-CM | POA: Diagnosis not present

## 2019-03-05 DIAGNOSIS — Z170 Estrogen receptor positive status [ER+]: Secondary | ICD-10-CM

## 2019-03-05 LAB — COMPREHENSIVE METABOLIC PANEL
ALT: 20 U/L (ref 0–44)
AST: 18 U/L (ref 15–41)
Albumin: 3.8 g/dL (ref 3.5–5.0)
Alkaline Phosphatase: 75 U/L (ref 38–126)
Anion gap: 9 (ref 5–15)
BUN: 14 mg/dL (ref 6–20)
CO2: 29 mmol/L (ref 22–32)
Calcium: 9.6 mg/dL (ref 8.9–10.3)
Chloride: 102 mmol/L (ref 98–111)
Creatinine, Ser: 1.05 mg/dL — ABNORMAL HIGH (ref 0.44–1.00)
GFR calc Af Amer: >60 mL/min (ref >60)
GFR calc non Af Amer: 58 mL/min — ABNORMAL LOW (ref >60)
Glucose, Bld: 114 mg/dL — ABNORMAL HIGH (ref 70–99)
Potassium: 4 mmol/L (ref 3.5–5.1)
Sodium: 140 mmol/L (ref 135–145)
Total Bilirubin: 0.6 mg/dL (ref 0.3–1.2)
Total Protein: 6.9 g/dL (ref 6.5–8.1)

## 2019-03-05 LAB — CBC WITH DIFFERENTIAL/PLATELET
Abs Immature Granulocytes: 0.01 10*3/uL (ref 0.00–0.07)
Basophils Absolute: 0 10*3/uL (ref 0.0–0.1)
Basophils Relative: 0 %
Eosinophils Absolute: 0.1 10*3/uL (ref 0.0–0.5)
Eosinophils Relative: 2 %
HCT: 37.1 % (ref 36.0–46.0)
Hemoglobin: 12.1 g/dL (ref 12.0–15.0)
Immature Granulocytes: 0 %
Lymphocytes Relative: 51 %
Lymphs Abs: 2.5 10*3/uL (ref 0.7–4.0)
MCH: 28.1 pg (ref 26.0–34.0)
MCHC: 32.6 g/dL (ref 30.0–36.0)
MCV: 86.1 fL (ref 80.0–100.0)
Monocytes Absolute: 0.4 10*3/uL (ref 0.1–1.0)
Monocytes Relative: 9 %
Neutro Abs: 1.9 10*3/uL (ref 1.7–7.7)
Neutrophils Relative %: 38 %
Platelets: 224 10*3/uL (ref 150–400)
RBC: 4.31 MIL/uL (ref 3.87–5.11)
RDW: 13.2 % (ref 11.5–15.5)
WBC: 5 10*3/uL (ref 4.0–10.5)
nRBC: 0 % (ref 0.0–0.2)

## 2019-03-10 ENCOUNTER — Ambulatory Visit: Payer: Medicare Other | Admitting: Internal Medicine

## 2019-03-11 NOTE — Progress Notes (Signed)
[facility]  [phone] [phone]  OFFICE PROGRESS NOTE   ID: Monserrath Junio   DOB: 1960-09-25  MR#: [id number]  WVP#:[id number]  Patient Care Team: Javier Docker, MD as PCP - General (Internal Medicine) Willia Craze, NP as Nurse Practitioner (Gastroenterology) Gardiner Barefoot, DPM as Consulting Physician (Podiatry) Melida Quitter, MD as Consulting Physician (Otolaryngology) Delice Bison, Charlestine Massed, NP as Nurse Practitioner (Hematology and Oncology) Eppie Gibson, MD as Attending Physician (Radiation Oncology) Latesa Fratto, Virgie Dad, MD as Consulting Physician (Oncology) Excell Seltzer, MD as Consulting Physician (General Surgery) OTHER MD: Earnie Larsson,  Theodis Sato, MD, Alysia Penna mD  CHIEF COMPLAINT: 58 left breast cancer  CURRENT THERAPY: Completing 7 years of anastrozole    INTERVAL HISTORY: Stpehanie is here today for follow up of her 58 left breast cancer.    She continues on anastrozole, with good tolerance. She has constant hot flashes that are primarily during the day. She notes vaginal dryness as well.   Since her last visit, she underwent bilateral screening mammography with tomography at [facility] on 06/02/2018 showing: breast density category B; no evidence of malignancy in either breast.  She also underwent bone density screening that same day. This showed a T-score of -0.8, which is considered normal.  She also underwent chest CT on 02/13/2019 for follow up of known lung nodules. This showed: no change in an 8 mm lingula pulmonary nodule as well as a 3 mm nodule of the dependent left lower lobe, these are likely benign.  She has been followed by Dr. Letta Pate and Dr. Ronnald Ramp for spinal stenosis (status post laminectomy on 11/29/2017) and chronic shoulder pain. Her most recent follow up was on 02/17/2019.  She underwent lumbar myelography on 08/14/2018, which showed: prior L3-L5 PLIF with interval revision  and posterior decompression at L4-L5, with resolved spinal canal stenosis; persistent pseudoarthrosis with loosening of the left L5 pedicle screw; new severe left neuroforaminal stenosis due to progressive endplate spurring and heterotopic ossification at the site of prior left hemi-facetectomy and foraminotomy; unchanged moderate right neuroforaminal stenosis at L5-S1 due to facet arthropathy; unchanged 2 mm L4-L5 and 4 mm L5-S1 anterolisthesis without dynamic instability.  She received steroid injection to her left shoulder on 09/08/2018 and to bilateral sacroiliac on 10/21/2018.   She also underwent right lower extremity venous study on 01/05/2019, which was negative for DVT.  She also presented with diffuse right ankle pain for 4 months. Right ankle MRI performed on 02/05/2019 showed: torn anterior talofibular ligament with edema and synovitis; thickened anterior inferior tibiofibular ligament, possibly the result of a prior tear or sprain; markedly thickened and indistinct superomedial portion of the spring ligament with ill definition of the medioplantar oblique and inferoplantar longitudinal portions of the spring ligament, favoring tearing, mild distal tibialis posterior tendinopathy; high signal in the deep tibiotalar portion of the deltoid ligament favoring sprain; 4 mm non-fragmented osteochondral lesion along the anteromedial talar dome; mild distal tibialis anterior tendinopathy and mild distal Achilles tendinopathy.     REVIEW OF SYSTEMS: Maree thinks her back surgery helped some but not too much.  She is still on hydrocodone.  This does not constipate her because she is also on metformin which "opened me up".  She is being scheduled for a sleep study by Dr. Annamaria Boots she says.  She takes care of her 3 grandchildren at home while her daughter works and her daughter she says is very careful regarding the pandemic.  Koby continues to have problems with vaginal  dryness issues.  A detailed review of  systems was otherwise stable.   BREAST CANCER HISTORY: From Dr. Julien Girt note 06/13/2011:   "The patient first noticed a lump in the upper outer left breast about mid November. She had been having regular mammograms. She consulted her physician and was referred to the breast center for further workup. Bilateral mammogram was performed which revealed an irregular mass in the upper outer quadrant of the left breast at the 2:00 position measuring approximately 3 cm. A clearly enlarged left axillary lymph node was also seen. 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 Ki-67 was 100%. Subsequent bilateral breast MRI was performed. This reveals abnormal enhancement in the area of the mass measuring 2.9 x 5.2 cm. Again noted was an approximately 3 cm axillary lymph node. No other areas were detected."   Her subsequent history is as detailed below.   PAST MEDICAL HISTORY: Past Medical History:  Diagnosis Date  . Anemia   . Anxiety   . Arthritis   . Breast cancer ([facility]) 2010   T3N1 invasive ductal carcinoma left breast.Takes Arimidex daily  . Bursitis   . Carpal tunnel syndrome   . Chronic back pain    stenosis  . Constipation    takes Colace daily  . Depression    takes Benzotropine daily  . Diverticulitis of colon   . Dyspnea    daily when walking for over 1 yr.  . Fibromyalgia 08/2012  . GERD (gastroesophageal reflux disease)    takes Dexilant daily  . Hemorrhoid   . History of blood transfusion    no abnormal reaction noted  . History of colon polyps    benign  . History of shingles   . Joint pain   . Joint swelling   . Night muscle spasms    takes Flexeril nightly as needed  . Nocturia   . OSA (obstructive sleep apnea)   . OSA on CPAP   . Peripheral edema    takes Furosemide.Just started 01/18/16  . Peripheral neuropathy    takes  Lyrica daily  . Personal history of chemotherapy    2013  . Personal history of radiation therapy    2013  . Pneumonia    hx of-2015  . Pseudoarthrosis of lumbar spine   . Seasonal allergies    takes Singulair nightly  . SOB (shortness of breath) on exertion    rarely with exertion  . 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 Alphonzo Grieve)    PAST SURGICAL HISTORY: Past Surgical History:  Procedure Laterality Date  . ABDOMINAL HYSTERECTOMY     still has ovaries  . APPENDECTOMY    . AXILLARY LYMPH NODE DISSECTION  11/28/2011   Procedure: AXILLARY LYMPH NODE DISSECTION;  Surgeon: Edward Jolly, MD;  Location: [facility];  Service: General;  Laterality: Left;  . BREAST LUMPECTOMY Left 11/28/2011   Malignant  . BREAST SURGERY Left 2013  . CARPAL TUNNEL RELEASE     Bilateral  . CESAREAN SECTION     pt. has had 3  . CHOLECYSTECTOMY    . COLONOSCOPY    . EYE SURGERY Bilateral    cataract removal  . KNEE SURGERY     Left Knee  . LAMINECTOMY WITH POSTERIOR LATERAL ARTHRODESIS LEVEL 1  N/A 11/29/2017   Procedure: Posterior Lateral Fusion - Lumbar Four-Lumbar Five, removal and replacement of hardware, Laminectomy - Lumbar Four-Lumbar Five;  Surgeon: Eustace Moore, MD;  Location[facility];  Service: Neurosurgery;  Laterality: N/A;  . LAMINECTOMY WITH POSTERIOR LATERAL ARTHRODESIS LEVEL 2 N/A 06/07/2017   Procedure: Posterior Lateral Fusion Lumbar Three-Four and Transforaminal Interbody Fusion Lumbar Four-Five with Segmental  Pedicle Screw Fixation;  Surgeon: Eustace Moore, MD;  Location: [facility];  Service: Neurosurgery;  Laterality: N/A;  Posterior Lateral Fusion Lumbar Three-Four and Transforaminal Interbody Fusion Lumbar Four-Five with Segmental  Pedicle Screw Fixation   . LUMBAR FUSION  06/07/2017   POST  . LUMBAR LAMINECTOMY/DECOMPRESSION MICRODISCECTOMY Bilateral 01/27/2016    Procedure: Laminectomy and Foraminotomy - Lumbar four -Lumbar five - bilateral- on-lay noninstrumented fusion;  Surgeon: Eustace Moore, MD;  Location: [facility];  Service: Neurosurgery;  Laterality: Bilateral;  . MULTIPLE EXTRACTIONS WITH ALVEOLOPLASTY N/A 05/11/2016   Procedure: EXTRACTION OF TEETH EIGHTEEN, TWENTY AND TWENTY- NINE;  REMOVAL OF MANDIBULAR TORUS AND EXOSTOSIS;  Surgeon: Diona Browner, DDS;  Location: [facility];  Service: Oral Surgery;  Laterality: N/A;  . PORTACATH PLACEMENT  06/18/2011   Procedure: INSERTION PORT-A-CATH;  Surgeon: Edward Jolly, MD;  Location: [facility];  Service: General;  Laterality: Right;  right subclavian  . removal portacath  2014  . spur     Apex spur on both big toes  . TOE SURGERY Bilateral   . TONSILLECTOMY    . TOTAL KNEE ARTHROPLASTY Left 09/13/2014  . TOTAL KNEE ARTHROPLASTY Left 09/13/2014   Procedure: LEFT TOTAL KNEE ARTHROPLASTY;  Surgeon: Rod Can, MD;  Location: [facility];  Service: Orthopedics;  Laterality: Left;  . TOTAL KNEE ARTHROPLASTY Right 03/10/2015   Procedure: RIGHT TOTAL KNEE ARTHROPLASTY;  Surgeon: Rod Can, MD;  Location: [facility];  Service: Orthopedics;  Laterality: Right;    FAMILY HISTORY Family History  Problem Relation Age of Onset  . Hypertension Mother   . Diabetes Mother   . Hypertension Father   . Diabetes Father   . Cancer Paternal Grandmother        unknown  . Colon cancer Neg Hx   Her father, 21 years old is the minister at [employer] she attends. Her mother is 33. The patient has 3 brothers and 3 sisters. There is no history of breast or ovarian cancer in the immediate family.   GYNECOLOGIC HISTORY: Menarche age 84, first live birth age 29, she is [place] P3. She underwent hysterectomy without salpingo-oophorectomy in 2000. She never took hormone replacement.   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 She is divorced. At home she  lives with herr daughter Ree Edman and her 6 children. Her son Quanna Wittke works as a Administrator. The patient has 5 additional grandchildren. She attends a [employer].     ADVANCED DIRECTIVES: Not in place   HEALTH MAINTENANCE: Social History   Tobacco Use  . Smoking status: Never Smoker  . Smokeless tobacco: Never Used  Substance Use Topics  . Alcohol use: No    Alcohol/week: 0.0 standard drinks  . Drug use: No     Colonoscopy: Not on file  PAP: Scheduled for January 2016  Bone density: Bone density December 2015 was normal.  Lipid panel: Not on file  Allergies  Allergen Reactions  . Aleve [Naproxen] Nausea Only  . Compazine [Prochlorperazine] Other (See Comments)    Numbness  of face and  lips   . Hydrocodone Itching    High dose only  . Oxycodone Other (See Comments)    hallucinations   . Penicillins Nausea Only and Other (See Comments)    Has patient had a PCN reaction causing immediate rash, facial/tongue/throat swelling, SOB or lightheadedness with hypotension: No Has patient had a PCN reaction causing severe rash involving mucus membranes or skin necrosis: No Has patient had a PCN reaction that required hospitalization No Has patient had a PCN reaction occurring within the last 10 years: No If all of the above answers are "NO", then may proceed with Cephalosporin use.    Current Outpatient Medications  Medication Sig Dispense Refill  . ACCU-CHEK AVIVA PLUS test strip TEST WHILE FASTING AND 2 HOURS AFTER SUPPER    . Accu-Chek Softclix Lancets lancets TEST AS DIRECTED AND 2 HOURS AFTER SUPPER    . B-D ULTRAFINE III SHORT PEN 31G X 8 MM MISC USE ONE NEEDLE D    . benzonatate (TESSALON) 100 MG capsule Take 100 mg by mouth 4 (four) times daily as needed for cough.     . bumetanide (BUMEX) 2 MG tablet bumetanide 2 mg tablet  Take 1 tablet every day by oral route.    . cyclobenzaprine (FLEXERIL) 5 MG tablet TAKE 1 TABLET(5 MG) BY MOUTH TWICE DAILY  AS NEEDED FOR MUSCLE SPASMS 60 tablet 3  . Dexlansoprazole (DEXILANT) 30 MG capsule Take 30 mg by mouth daily.    . DULoxetine (CYMBALTA) 60 MG capsule Take 60 mg by mouth daily.     Marland Kitchen ezetimibe (ZETIA) 10 MG tablet Take 10 mg by mouth daily.    Marland Kitchen glipiZIDE (GLUCOTROL) 5 MG tablet     . HYDROcodone-acetaminophen (NORCO) 7.5-325 MG tablet One tablet every 8 hours as needed for pain. May take an extra tablet when pain is sever. Do Not Fill Before 02/22/2019 110 tablet 0  . Insulin Degludec (TRESIBA FLEXTOUCH [place]) Inject into the skin.    Marland Kitchen ipratropium (ATROVENT) 0.03 % nasal spray INSTILL 2 SPRAYS INTO EACH NOSTRIL 2 TIMES DAILY    . levocetirizine (XYZAL) 5 MG tablet Take 5 mg by mouth daily.    Marland Kitchen lisinopril (ZESTRIL) 5 MG tablet     . meloxicam (MOBIC) 7.5 MG tablet Take 1 tablet (7.5 mg total) by mouth daily. 30 tablet 0  . metFORMIN (GLUCOPHAGE) 850 MG tablet Take 850 mg by mouth 2 (two) times daily with a meal.    . montelukast (SINGULAIR) 10 MG tablet Take 10 mg by mouth at bedtime.    Marland Kitchen NARCAN 4 MG/0.1ML LIQD nasal spray kit USE AS DIRECTED    . ondansetron (ZOFRAN) 4 MG tablet Take 4 mg by mouth every 8 (eight) hours as needed for nausea or vomiting.    . Potassium Chloride ER 20 MEQ TBCR TK 1 T PO EACH DAY    . potassium chloride SA (K-DUR) 20 MEQ tablet     . pregabalin (LYRICA) 100 MG capsule TAKE 1 CAPSULE(100 MG) BY MOUTH THREE TIMES DAILY 90 capsule 5  . temazepam (RESTORIL) 15 MG capsule Take 1 capsule (15 mg total) by mouth at bedtime as needed for sleep. 30 capsule 5  . umeclidinium-vilanterol (ANORO ELLIPTA) 62.5-25 MCG/INH AEPB Inhale 1 puff into the lungs daily. 1 each 12  . Valbenazine Tosylate (INGREZZA) 40 MG CAPS Take 40 mg by mouth daily.     No current facility-administered medications for this visit.     OBJECTIVE: Morbidly  obese African-American woman who appears stated age  20:   03/12/19 1008  BP: (!) 146/91  Pulse: (!) 106  Resp: 18  Temp: 98.7 F (37.1  C)  SpO2: 97%     Body mass index is 51.99 kg/m.      ECOG FS: 2 - Symptomatic, <50% confined to bed   Sclerae unicteric, EOMs intact Wearing a mask No cervical or supraclavicular adenopathy Lungs no rales or rhonchi Heart regular rate and rhythm Abd soft, obese, nontender, positive bowel sounds MSK no focal spinal tenderness, no upper extremity lymphedema Neuro: nonfocal, well oriented, appropriate affect Breasts: The right breast is benign per the left breast has undergone lumpectomy and then radiation.  There is no evidence of disease recurrence.  There is still mild hyperpigmentation.  Both axillae are benign.   LAB RESULTS: Lab Results  Component Value Date   WBC 5.0 03/05/2019   NEUTROABS 1.9 03/05/2019   HGB 12.1 03/05/2019   HCT 37.1 03/05/2019   MCV 86.1 03/05/2019   PLT 224 03/05/2019      Chemistry      Component Value Date/Time   NA 140 03/05/2019 0946   NA 144 03/07/2017 1327   K 4.0 03/05/2019 0946   K 3.3 (L) 03/07/2017 1327   CL 102 03/05/2019 0946   CL 101 09/18/2012 0853   CO2 29 03/05/2019 0946   CO2 32 (H) 03/07/2017 1327   BUN 14 03/05/2019 0946   BUN 16.0 03/07/2017 1327   CREATININE 1.05 (H) 03/05/2019 0946   CREATININE 0.96 09/05/2017 1112   CREATININE 1.0 03/07/2017 1327      Component Value Date/Time   CALCIUM 9.6 03/05/2019 0946   CALCIUM 10.0 03/07/2017 1327   ALKPHOS 75 03/05/2019 0946   ALKPHOS 92 03/07/2017 1327   AST 18 03/05/2019 0946   AST 22 09/05/2017 1112   AST 21 03/07/2017 1327   ALT 20 03/05/2019 0946   ALT 18 09/05/2017 1112   ALT 17 03/07/2017 1327   BILITOT 0.6 03/05/2019 0946   BILITOT 0.7 09/05/2017 1112   BILITOT 0.80 03/07/2017 1327      Lab Results  Component Value Date   LABCA2 41 (H) 06/13/2011    Urinalysis    Component Value Date/Time   COLORURINE YELLOW 09/02/2014 1044   APPEARANCEUR CLEAR 09/02/2014 1044   LABSPEC 1.012 09/02/2014 1044   PHURINE 6.0 09/02/2014 1044   GLUCOSEU NEGATIVE  09/02/2014 1044   HGBUR NEGATIVE 09/02/2014 1044   BILIRUBINUR NEGATIVE 09/02/2014 1044   KETONESUR NEGATIVE 09/02/2014 1044   PROTEINUR NEGATIVE 09/02/2014 1044   UROBILINOGEN 0.2 09/02/2014 1044   NITRITE NEGATIVE 09/02/2014 1044   LEUKOCYTESUR NEGATIVE 09/02/2014 1044    STUDIES: Ct Chest W Contrast  Result Date: 02/13/2019 CLINICAL DATA:  Breast cancer, pulmonary metastatic disease suspected, follow-up lung nodules EXAM: CT CHEST WITH CONTRAST TECHNIQUE: Multidetector CT imaging of the chest was performed during intravenous contrast administration. 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 Cardiomegaly. No pericardial effusion. Mediastinum/Nodes: No enlarged mediastinal, hilar, or axillary lymph nodes. Thyroid gland, trachea, and esophagus demonstrate no significant findings. Lungs/Pleura: No change in an 8 mm pulmonary nodule of the lingula (series 7, image 67) as well as a 3 mm nodule of the dependent left lower lobe (series 5, image 70). No pleural effusion or pneumothorax. Upper Abdomen: No acute abnormality. Hepatic steatosis. Status post cholecystectomy. Musculoskeletal: No  chest wall mass or suspicious bone lesions identified. Surgical clips in the left breast and left axilla. IMPRESSION: 1. No change in an 8 mm pulmonary nodule of the lingula (series 7, image 67) as well as a 3 mm nodule of the dependent left lower lobe (series 5, image 70). Given the significant interval stability dating back to 09/13/2017, these are likely benign sequelae of infection or inflammation. Attention on follow-up as indicated by clinical oncologic protocol. 2.  Cardiomegaly. 3.  Hepatic steatosis. Electronically Signed   By: Eddie Candle M.D.   On: 02/13/2019 17:02     ASSESSMENT: 58 y.o. [place], [place] woman:  (1) Status post left breast  biopsy 06/01/2011 for a cT2 pN1, stage IIB invasive ductal carcinoma, grade 3, estrogen receptor 80% and progesterone receptor 9% positive, with no HER-2 amplification and an MIB-1 of 100%  (2) Treated neoadjuvantly with 4 cycles of cyclophosphamide, epirubicin and fluorouracil, followed by 4 cycles of docetaxel, completed 11/01/2011  (3) Status post left lumpectomy and axillary lymph node resection 11/28/2011, showing a complete pathologic response (no residual invasive or in situ cancer in the breast and 0 of 17 lymph nodes involved)  (4) Adjuvant radiation therapy completed 03/31/2012  (5) Started anastrozole November 2013, completing 7 years October 2020  (a) osteopenia on bone density 05/2016 with t score of -1.3 in the right femur  (b) repeat bone density 06/02/2018 showed a T score of -0.8, in the normal range  (6) chronic Left upper extremity lymphedema  (7) fibromyalgia and osteoarthritis with polyarthralgia  PLAN:   Carliss is now a little over 7 years out from definitive surgery for her breast cancer with no evidence of disease recurrence.  This is very favorable.  She has completed 7 years of anastrozole.  She understands of 7 years is as good as 10 and therefore she is going off anastrozole at this point.  We had been following a small nodule in her lung.  This remains completely stable.  I do not believe she needs further CT scans simply to evaluate that in the future.  Certainly if she has any scans for any other reason that area could be reimaged  At this point I feel comfortable releasing her to her primary care physician.  All she will need his yearly mammography and a yearly physician breast exam  I will be glad to see Alexxis again at any point in the future if and when the need arises but as of now are making no further routine appointments for her here.   Monaye Blackie, Virgie Dad, MD  03/12/19 10:37 AM Medical Oncology and Hematology [facility] [address] [place], [place] [address] Tel. [phone]    Fax. [phone]    I, Wilburn Mylar, am acting as scribe for Dr. Virgie Dad. Angelika Jerrett.  I, Lurline Del MD, have reviewed the above documentation for accuracy and completeness, and I agree with the above.

## 2019-03-12 ENCOUNTER — Other Ambulatory Visit: Payer: Self-pay

## 2019-03-12 ENCOUNTER — Inpatient Hospital Stay (HOSPITAL_BASED_OUTPATIENT_CLINIC_OR_DEPARTMENT_OTHER): Payer: Medicare HMO | Admitting: Oncology

## 2019-03-12 VITALS — BP 146/91 | HR 106 | Temp 98.7°F | Resp 18 | Ht 63.0 in | Wt 293.5 lb

## 2019-03-12 DIAGNOSIS — C50412 Malignant neoplasm of upper-outer quadrant of left female breast: Secondary | ICD-10-CM

## 2019-03-12 DIAGNOSIS — Z170 Estrogen receptor positive status [ER+]: Secondary | ICD-10-CM

## 2019-03-23 ENCOUNTER — Encounter: Payer: Medicare HMO | Attending: Physical Medicine & Rehabilitation | Admitting: Registered Nurse

## 2019-03-23 ENCOUNTER — Other Ambulatory Visit: Payer: Self-pay

## 2019-03-23 ENCOUNTER — Encounter: Payer: Self-pay | Admitting: Registered Nurse

## 2019-03-23 VITALS — BP 119/83 | HR 88 | Temp 97.2°F | Ht 63.0 in | Wt 293.0 lb

## 2019-03-23 DIAGNOSIS — K219 Gastro-esophageal reflux disease without esophagitis: Secondary | ICD-10-CM | POA: Diagnosis not present

## 2019-03-23 DIAGNOSIS — Z9889 Other specified postprocedural states: Secondary | ICD-10-CM | POA: Insufficient documentation

## 2019-03-23 DIAGNOSIS — M48061 Spinal stenosis, lumbar region without neurogenic claudication: Secondary | ICD-10-CM | POA: Insufficient documentation

## 2019-03-23 DIAGNOSIS — G8918 Other acute postprocedural pain: Secondary | ICD-10-CM | POA: Insufficient documentation

## 2019-03-23 DIAGNOSIS — Z79891 Long term (current) use of opiate analgesic: Secondary | ICD-10-CM | POA: Diagnosis not present

## 2019-03-23 DIAGNOSIS — Z853 Personal history of malignant neoplasm of breast: Secondary | ICD-10-CM | POA: Insufficient documentation

## 2019-03-23 DIAGNOSIS — G8929 Other chronic pain: Secondary | ICD-10-CM | POA: Diagnosis present

## 2019-03-23 DIAGNOSIS — Z5181 Encounter for therapeutic drug level monitoring: Secondary | ICD-10-CM | POA: Diagnosis not present

## 2019-03-23 DIAGNOSIS — G4733 Obstructive sleep apnea (adult) (pediatric): Secondary | ICD-10-CM | POA: Diagnosis not present

## 2019-03-23 DIAGNOSIS — M961 Postlaminectomy syndrome, not elsewhere classified: Secondary | ICD-10-CM | POA: Diagnosis not present

## 2019-03-23 DIAGNOSIS — M797 Fibromyalgia: Secondary | ICD-10-CM | POA: Insufficient documentation

## 2019-03-23 DIAGNOSIS — F329 Major depressive disorder, single episode, unspecified: Secondary | ICD-10-CM | POA: Diagnosis not present

## 2019-03-23 DIAGNOSIS — G894 Chronic pain syndrome: Secondary | ICD-10-CM | POA: Diagnosis not present

## 2019-03-23 DIAGNOSIS — Z981 Arthrodesis status: Secondary | ICD-10-CM

## 2019-03-23 DIAGNOSIS — M545 Low back pain: Secondary | ICD-10-CM | POA: Diagnosis present

## 2019-03-23 NOTE — Patient Instructions (Signed)
Continue with slow weaning of Hydrocodone : Call office by 04/03/2019 regarding the weaning of hydrocodone.  Zella Ball: [phone]

## 2019-03-23 NOTE — Progress Notes (Signed)
Subjective:    Patient ID: Kelsey Wilson, female    DOB: [birth year], 58 y.o.   MRN: [id number]  HPI: Kelsey Wilson is a 58 y.o. female who returns for follow up appointment for chronic pain and medication refill. She states her pain is located in her neck, lower back radiating into her bilateral lower extremities to her thigh, bilateral ankle and bilateral feet pain. She rates her pain 6. Her current exercise regime is walking and performing stretching exercises.  [name] Morphine equivalent is 30.56 MME.  Oral Swab was Performed today.    Pain Inventory Average Pain 7 Pain Right Now 6 My pain is aching  In the last 24 hours, has pain interfered with the following? General activity 2 Relation with others 2 Enjoyment of life 2 What TIME of day is your pain at its worst? daytime Sleep (in general) Fair  Pain is worse with: walking, bending, sitting and standing Pain improves with: rest, heat/ice and medication Relief from Meds: 5  Mobility walk without assistance do you drive?  yes Do you have any goals in this area?  no  Function I need assistance with the following:  meal prep and household duties Do you have any goals in this area?  no  Neuro/Psych numbness spasms anxiety  Prior Studies Any changes since last visit?  no  Physicians involved in your care Any changes since last visit?  no   Family History  Problem Relation Age of Onset  . Hypertension Mother   . Diabetes Mother   . Hypertension Father   . Diabetes Father   . Cancer Paternal Grandmother        unknown  . Colon cancer Neg Hx    Social History   Socioeconomic History  . Marital status: Single    Spouse name: Not on file  . Number of children: 3  . Years of education: Not on file  . Highest education level: Not on file  Occupational History  . Not on file  Social Needs  . Financial resource strain: Not on file  . Food insecurity    Worry: Not on file    Inability:  Not on file  . Transportation needs    Medical: Not on file    Non-medical: Not on file  Tobacco Use  . Smoking status: Never Smoker  . Smokeless tobacco: Never Used  Substance and Sexual Activity  . Alcohol use: No    Alcohol/week: 0.0 standard drinks  . Drug use: No  . Sexual activity: Never    Birth control/protection: Post-menopausal, Surgical  Lifestyle  . Physical activity    Days per week: Not on file    Minutes per session: Not on file  . Stress: Not on file  Relationships  . Social Herbalist on phone: Not on file    Gets together: Not on file    Attends religious service: Not on file    Active member of club or organization: Not on file    Attends meetings of clubs or organizations: Not on file    Relationship status: Not on file  Other Topics Concern  . Not on file  Social History Narrative  . Not on file   Past Surgical History:  Procedure Laterality Date  . ABDOMINAL HYSTERECTOMY     still has ovaries  . APPENDECTOMY    . AXILLARY LYMPH NODE DISSECTION  11/28/2011   Procedure: AXILLARY LYMPH NODE DISSECTION;  Surgeon: Marland Kitchen T  Hoxworth, MD;  Location: [place];  Service: General;  Laterality: Left;  . BREAST LUMPECTOMY Left 11/28/2011   Malignant  . BREAST SURGERY Left 2013  . CARPAL TUNNEL RELEASE     Bilateral  . CESAREAN SECTION     pt. has had 3  . CHOLECYSTECTOMY    . COLONOSCOPY    . EYE SURGERY Bilateral    cataract removal  . KNEE SURGERY     Left Knee  . LAMINECTOMY WITH POSTERIOR LATERAL ARTHRODESIS LEVEL 1 N/A 11/29/2017   Procedure: Posterior Lateral Fusion - Lumbar Four-Lumbar Five, removal and replacement of hardware, Laminectomy - Lumbar Four-Lumbar Five;  Surgeon: Eustace Moore, MD;  Location: [facility];  Service: Neurosurgery;  Laterality: N/A;  . LAMINECTOMY WITH POSTERIOR LATERAL ARTHRODESIS LEVEL 2 N/A 06/07/2017   Procedure: Posterior Lateral Fusion Lumbar Three-Four and Transforaminal Interbody Fusion Lumbar Four-Five with  Segmental  Pedicle Screw Fixation;  Surgeon: Eustace Moore, MD;  Location: [place];  Service: Neurosurgery;  Laterality: N/A;  Posterior Lateral Fusion Lumbar Three-Four and Transforaminal Interbody Fusion Lumbar Four-Five with Segmental  Pedicle Screw Fixation   . LUMBAR FUSION  06/07/2017   POST  . LUMBAR LAMINECTOMY/DECOMPRESSION MICRODISCECTOMY Bilateral 01/27/2016   Procedure: Laminectomy and Foraminotomy - Lumbar four -Lumbar five - bilateral- on-lay noninstrumented fusion;  Surgeon: Eustace Moore, MD;  Location: [place] [facility];  Service: Neurosurgery;  Laterality: Bilateral;  . MULTIPLE EXTRACTIONS WITH ALVEOLOPLASTY N/A 05/11/2016   Procedure: EXTRACTION OF TEETH EIGHTEEN, TWENTY AND TWENTY- NINE;  REMOVAL OF MANDIBULAR TORUS AND EXOSTOSIS;  Surgeon: Diona Browner, DDS;  Location: [place];  Service: Oral Surgery;  Laterality: N/A;  . PORTACATH PLACEMENT  06/18/2011   Procedure: INSERTION PORT-A-CATH;  Surgeon: Edward Jolly, MD;  Location: [place];  Service: General;  Laterality: Right;  right subclavian  . removal portacath  2014  . spur     Apex spur on both big toes  . TOE SURGERY Bilateral   . TONSILLECTOMY    . TOTAL KNEE ARTHROPLASTY Left 09/13/2014  . TOTAL KNEE ARTHROPLASTY Left 09/13/2014   Procedure: LEFT TOTAL KNEE ARTHROPLASTY;  Surgeon: Rod Can, MD;  Location: [place];  Service: Orthopedics;  Laterality: Left;  . TOTAL KNEE ARTHROPLASTY Right 03/10/2015   Procedure: RIGHT TOTAL KNEE ARTHROPLASTY;  Surgeon: Rod Can, MD;  Location: [facility];  Service: Orthopedics;  Laterality: Right;   Past Medical History:  Diagnosis Date  . Anemia   . Anxiety   . Arthritis   . Breast cancer ([place]) 2010   T3N1 invasive ductal carcinoma left breast.Takes Arimidex daily  . Bursitis   . Carpal tunnel syndrome   . Chronic back pain    stenosis  . Constipation    takes Colace daily  . Depression    takes Benzotropine daily  . Diverticulitis of colon   . Dyspnea     daily when walking for over 1 yr.  . Fibromyalgia 08/2012  . GERD (gastroesophageal reflux disease)    takes Dexilant daily  . Hemorrhoid   . History of blood transfusion    no abnormal reaction noted  . History of colon polyps    benign  . History of shingles   . Joint pain   . Joint swelling   . Night muscle spasms    takes Flexeril nightly as needed  . Nocturia   . OSA (obstructive sleep apnea)   . OSA on CPAP   . Peripheral edema  takes Furosemide.Just started 01/18/16  . Peripheral neuropathy    takes Lyrica daily  . Personal history of chemotherapy    2013  . Personal history of radiation therapy    2013  . Pneumonia    hx of-2015  . Pseudoarthrosis of lumbar spine   . Seasonal allergies    takes Singulair nightly  . SOB (shortness of breath) on exertion    rarely with exertion  . 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 Alphonzo Grieve)   BP 119/83   Pulse 88   Temp (!) 97.2 F (36.2 C)   Ht 5\' 3"  (1.6 m)   Wt 293 lb (132.9 kg)   SpO2 94%   BMI 51.90 kg/m   Opioid Risk Score:   Fall Risk Score:  `1  Depression screen PHQ 2/9  Depression screen [facility] 2/9 08/04/2018 06/06/2018 04/04/2018 01/13/2018 12/13/2017 06/21/2017 03/07/2017  Decreased Interest 0 0 0 3 3 1 2   Down, Depressed, Hopeless 0 0 0 3 3 1 2   PHQ - 2 Score 0 0 0 6 6 2 4   Altered sleeping - - - - - - 0  Tired, decreased energy - - - - - - 3  Change in appetite - - - - - - 3  Feeling bad or failure about yourself  - - - - - - 2  Trouble concentrating - - - - - - 3  Moving slowly or fidgety/restless - - - - - - 3  Suicidal thoughts - - - - - - 0  PHQ-9 Score - - - - - - 18  Difficult doing work/chores - - - - - - Extremely dIfficult  Some recent data might be hidden    Review of Systems  Constitutional: Positive for unexpected weight change.  HENT: Negative.   Cardiovascular:  Negative.   Gastrointestinal: Negative.   Musculoskeletal: Positive for back pain, myalgias and neck pain.       Spasms   Skin: Negative.   Neurological: Positive for numbness.       Spasms   Hematological: Negative.   Psychiatric/Behavioral: The patient is nervous/anxious.        Objective:   Physical Exam Vitals signs and nursing note reviewed.  Constitutional:      Appearance: Normal appearance.  Neck:     Musculoskeletal: Normal range of motion and neck supple.  Cardiovascular:     Rate and Rhythm: Normal rate and regular rhythm.     Pulses: Normal pulses.     Heart sounds: Normal heart sounds.  Pulmonary:     Effort: Pulmonary effort is normal.     Breath sounds: Normal breath sounds.  Musculoskeletal:     Comments: Normal Muscle Bulk and Muscle Testing Reveals:  Upper Extremities: Full ROM and Muscle Strength 5/5  Bilateral; AC Joint Tenderness  Lumbar Paraspinal Tenderness: L-3-L-5 Lower Extremities: Full ROM and Muscle Strength 5/5 Arises from Table Slowly Antalgic  Gait   Skin:    General: Skin is warm and dry.  Neurological:     Mental Status: She is alert and oriented to person, place, and time.  Psychiatric:        Mood and Affect: Mood normal.        Behavior: Behavior normal.           Assessment & Plan:  1. Lumbar Postlaminectomy/ S/P Lumbar Fusion:Spinal Stenosis: S/PPosterior Lateral Fusion L-4-L-5  removal and replacement of hardware, Laminectomy L4-L5 by Dr. Ronnald Ramp on 03/22/2018.  Continue HEP as Tolerated and Continue current medication regime. Continue withSlow Weaning:Hydrocodone7.5/325 mg one tablet every 6 hours as needed#110.No script given instructed to call office in two weeks. She verbalizes understanding. 03/23/2019 2. Lumbar Radiculitis: Continue current medication regime with Lyrica.03/23/2019 3. Fibromyalgia:ContinueHEP as Tolerated.Continue Current Medication Regimen with Lyrica.03/23/2019 4.RightGreater Trochanter  Bursitis: No complaints today. Continue to Alternate Ice and Heat Therapy.03/23/2019. 5. Muscle Spasm:Continue:Flexeril..Continue to Monitor.03/23/2019. 6. Chronic Pain Syndrome: Continue Current medication regimen and HEP as Tolerated. Continue to Monitor.03/23/2019. 7. Bilateral OA of Bilateral Knees: Orthopedist following.Continue to Monitor.03/23/2019. 8. Bilateral Hand Pain:No complaints today.Continue to Monitor/ ? OA: Continue to Monitor.03/23/2019 9. Cervicalgia:Continue to alternate Heat/Ice Therapy. Continue HEP as Tolerated.03/23/2019. 10. Chronic Bilateral Shoulders:No complaints today.Continue to alternate Ice/ Heat therapy and HEP as Tolerated.03/23/2019. 11. Bilateral Ankle Pain: Podiatry Following: Continue to Monitor. 03/23/2019  36minutes of Face to Face Patient Care time was spent during this visit. All questions encouraged and answered,   F/U in 1 month

## 2019-03-26 ENCOUNTER — Ambulatory Visit: Payer: Medicare HMO | Admitting: Physical Therapy

## 2019-03-28 LAB — DRUG TOX MONITOR 1 W/CONF, ORAL FLD

## 2019-03-28 LAB — DRUG TOX ALC METAB W/CON, ORAL FLD: Alcohol Metabolite: NEGATIVE ng/mL (ref <25)

## 2019-04-01 ENCOUNTER — Telehealth: Payer: Self-pay | Admitting: *Deleted

## 2019-04-01 NOTE — Telephone Encounter (Signed)
Oral swab drug screen was completely negative. She reported taking her hydrocodone last on the same day as the test so it should be positive. Previous oral swabs have been positive.

## 2019-04-01 NOTE — Telephone Encounter (Signed)
Oral Swabs results reviewed, will discuss with Kelsey Wilson.

## 2019-04-02 ENCOUNTER — Ambulatory Visit: Payer: Medicare HMO | Admitting: Podiatry

## 2019-04-06 ENCOUNTER — Encounter: Payer: Self-pay | Admitting: Physical Therapy

## 2019-04-06 ENCOUNTER — Ambulatory Visit: Payer: Medicare HMO | Attending: Oncology | Admitting: Physical Therapy

## 2019-04-06 ENCOUNTER — Other Ambulatory Visit: Payer: Self-pay

## 2019-04-06 ENCOUNTER — Telehealth: Payer: Self-pay

## 2019-04-06 DIAGNOSIS — R293 Abnormal posture: Secondary | ICD-10-CM | POA: Insufficient documentation

## 2019-04-06 DIAGNOSIS — R252 Cramp and spasm: Secondary | ICD-10-CM | POA: Insufficient documentation

## 2019-04-06 DIAGNOSIS — M6281 Muscle weakness (generalized): Secondary | ICD-10-CM | POA: Insufficient documentation

## 2019-04-06 NOTE — Telephone Encounter (Signed)
PMP was reviewed: Hydrocodone was e-scribed, pharmacy never received the order. Placed a call to IT, and Ms. Granade is aware of the above, awaiting a return call.

## 2019-04-06 NOTE — Patient Instructions (Addendum)
Moisturizers . They are used in the vagina to hydrate the mucous membrane that make up the vaginal canal. . Designed to keep a more normal acid balance (ph) . Once placed in the vagina, it will last between two to three days.  . Use 2-3 times per week at bedtime  . Ingredients to avoid is glycerin and fragrance, can increase chance of infection . Should not be used just before sex due to causing irritation . Most are gels administered either in a tampon-shaped applicator or as a vaginal suppository. They are non-hormonal.   Types of Moisturizers  . Vitamin E vaginal suppositories- Whole foods, Amazon . Moist Again . Coconut oil- can break down condoms . Julva- (Do no use if on Tamoxifen) amazon . Yes moisturizer- amazon . NeuEve Silk , NeuEve Silver for menopausal or over 65 (if have severe vaginal atrophy or cancer treatments use NeuEve Silk for  1 month than move to [employer])- [employer], MapleFlower.dk . Olive and Bee intimate cream- www.oliveandbee.com.au . Mae vaginal [place] . Aloe   Creams to use externally on the Vulva area  Albertson's (good for for cancer patients that had radiation to the area)- [place] or [employer].FlyingBasics.com.br  V-magic cream - amazon  Julva-amazon  Vital "V [place] salve ( help moisturize and help with thinning vulvar area, does have [place] by Irwin Brakeman labial moisturizer ([place],   Coconut or olive oil  aloe   Things to avoid in the vaginal area . Do not use things to irritate the vulvar area . No lotions just specialized creams for the vulva area- Neogyn, V-magic, No soaps; can use Aveeno or Calendula cleanser if needed. Must be gentle . No deodorants . No douches . Good to sleep without underwear to let the vaginal area to air out . No scrubbing: spread the lips to let warm water rinse over labias and pat dry   STRETCHING THE PELVIC  FLOOR MUSCLES NO DILATOR  Supplies . Vaginal lubricant . Mirror (optional) . Gloves (optional) Positioning . Start in a semi-reclined position with your head propped up. Bend your knees and place your thumb or finger at the vaginal opening. Procedure . Apply a moderate amount of lubricant on the outer skin of your vagina, the labia minora.  Apply additional lubricant to your finger. Marland Kitchen Spread the skin away from the vaginal opening. Place the end of your finger at the opening. . Do a maximum contraction of the pelvic floor muscles. Tighten the vagina and the anus maximally and relax. . When you know they are relaxed, gently and slowly insert your finger into your vagina, directing your finger slightly downward, for 2-3 inches of insertion. . Relax and stretch the 6 o'clock position . Hold each stretch for _2 min__ and repeat __1_ time with rest breaks of _1__ seconds between each stretch. . Repeat the stretching in the 4 o'clock and 8 o'clock positions. . Total time should be _6__ minutes, _1__ x per day.  Note the amount of theme your were able to achieve and your tolerance to your finger in your vagina. . Once you have accomplished the techniques you may try them in standing with one foot resting on the tub, or in other positions.  This is a good stretch to do in the shower if you don't need to use lubricant.   Access Code: [email or web address]  URL: [email or web address]  Date: 04/06/2019  Prepared  by: Jari Favre   Exercises  Supine Diaphragmatic Breathing with Pelvic Floor Lengthening - 10 reps - 1 sets - 3x daily - 7x weekly  Seated Hamstring Stretch - 3 reps - 1 sets - 30 sec hold - 1x daily - 7x weekly  Supine Butterfly Groin Stretch - 3 reps - 1 sets - 30 sec hold - 1x daily - 7x weekly  Supine Hip Internal and External Rotation - 10 reps - 1 sets - 5 sec hold - 1x daily - 7x weekly

## 2019-04-06 NOTE — Therapy (Signed)
[facility] [address] [place], [place], [address] Phone: [phone]   Fax:  [phone]  Physical Therapy Evaluation  Patient Details  Name: Kelsey Wilson MRN: [id number] Date of Birth: [birth year] Referring Provider (PT): Magrinat, Virgie Dad, MD   Encounter Date: 04/06/2019  PT End of Session - 04/06/19 1516    Visit Number  1    Date for PT Re-Evaluation  06/29/19    PT Start Time  [id number]    PT Stop Time  1227    PT Time Calculation (min)  42 min    Activity Tolerance  Patient tolerated treatment well    Behavior During Therapy  [facility] for tasks assessed/performed       Past Medical History:  Diagnosis Date  . Anemia   . Anxiety   . Arthritis   . Breast cancer ([place]) 2010   T3N1 invasive ductal carcinoma left breast.Takes Arimidex daily  . Bursitis   . Carpal tunnel syndrome   . Chronic back pain    stenosis  . Constipation    takes Colace daily  . Depression    takes Benzotropine daily  . Diverticulitis of colon   . Dyspnea    daily when walking for over 1 yr.  . Fibromyalgia 08/2012  . GERD (gastroesophageal reflux disease)    takes Dexilant daily  . Hemorrhoid   . History of blood transfusion    no abnormal reaction noted  . History of colon polyps    benign  . History of shingles   . Joint pain   . Joint swelling   . Night muscle spasms    takes Flexeril nightly as needed  . Nocturia   . OSA (obstructive sleep apnea)   . OSA on CPAP   . Peripheral edema    takes Furosemide.Just started 01/18/16  . Peripheral neuropathy    takes Lyrica daily  . Personal history of chemotherapy    2013  . Personal history of radiation therapy    2013  . Pneumonia    hx of-2015  . Pseudoarthrosis of lumbar spine   . Seasonal allergies    takes Singulair nightly  . SOB (shortness of breath) on exertion    rarely with exertion  . 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 Alphonzo Grieve)    Past Surgical History:  Procedure Laterality Date  . ABDOMINAL HYSTERECTOMY     still has ovaries  . APPENDECTOMY    . AXILLARY LYMPH NODE DISSECTION  11/28/2011   Procedure: AXILLARY LYMPH NODE DISSECTION;  Surgeon: Edward Jolly, MD;  Location: [place];  Service: General;  Laterality: Left;  . BREAST LUMPECTOMY Left 11/28/2011   Malignant  . BREAST SURGERY Left 2013  . CARPAL TUNNEL RELEASE     Bilateral  . CESAREAN SECTION     pt. has had 3  . CHOLECYSTECTOMY    . COLONOSCOPY    . EYE SURGERY Bilateral    cataract removal  . KNEE SURGERY     Left Knee  . LAMINECTOMY WITH POSTERIOR LATERAL ARTHRODESIS LEVEL 1 N/A 11/29/2017   Procedure: Posterior Lateral Fusion - Lumbar Four-Lumbar Five, removal and replacement of hardware, Laminectomy - Lumbar Four-Lumbar Five;  Surgeon: Eustace Moore, MD;  Location: [facility];  Service: Neurosurgery;  Laterality: N/A;  . LAMINECTOMY WITH POSTERIOR LATERAL ARTHRODESIS LEVEL 2 N/A 06/07/2017  Procedure: Posterior Lateral Fusion Lumbar Three-Four and Transforaminal Interbody Fusion Lumbar Four-Five with Segmental  Pedicle Screw Fixation;  Surgeon: Eustace Moore, MD;  Location: [place];  Service: Neurosurgery;  Laterality: N/A;  Posterior Lateral Fusion Lumbar Three-Four and Transforaminal Interbody Fusion Lumbar Four-Five with Segmental  Pedicle Screw Fixation   . LUMBAR FUSION  06/07/2017   POST  . LUMBAR LAMINECTOMY/DECOMPRESSION MICRODISCECTOMY Bilateral 01/27/2016   Procedure: Laminectomy and Foraminotomy - Lumbar four -Lumbar five - bilateral- on-lay noninstrumented fusion;  Surgeon: Eustace Moore, MD;  Location: [place] [facility];  Service: Neurosurgery;  Laterality: Bilateral;  . MULTIPLE EXTRACTIONS WITH ALVEOLOPLASTY N/A 05/11/2016   Procedure: EXTRACTION OF TEETH EIGHTEEN, TWENTY AND TWENTY- NINE;  REMOVAL OF MANDIBULAR TORUS AND EXOSTOSIS;   Surgeon: Diona Browner, DDS;  Location: [place];  Service: Oral Surgery;  Laterality: N/A;  . PORTACATH PLACEMENT  06/18/2011   Procedure: INSERTION PORT-A-CATH;  Surgeon: Edward Jolly, MD;  Location: [place];  Service: General;  Laterality: Right;  right subclavian  . removal portacath  2014  . spur     Apex spur on both big toes  . TOE SURGERY Bilateral   . TONSILLECTOMY    . TOTAL KNEE ARTHROPLASTY Left 09/13/2014  . TOTAL KNEE ARTHROPLASTY Left 09/13/2014   Procedure: LEFT TOTAL KNEE ARTHROPLASTY;  Surgeon: Rod Can, MD;  Location: [place];  Service: Orthopedics;  Laterality: Left;  . TOTAL KNEE ARTHROPLASTY Right 03/10/2015   Procedure: RIGHT TOTAL KNEE ARTHROPLASTY;  Surgeon: Rod Can, MD;  Location: [facility];  Service: Orthopedics;  Laterality: Right;    There were no vitals filed for this visit.   Subjective Assessment - 04/06/19 1154    Subjective  Pt states she has had a lot of pain and was not able to tolerate her pelvic exam.  Pt is having nocturia 2-3x / night.  Pt denies straining with BM or urination    Pertinent History  07/2017    Patient Stated Goals  be able to have a pelvic exam next year    Currently in Pain?  Yes    Pain Score  3     Pain Location  Genitalia    Pain Orientation  Mid;Right    Pain Descriptors / Indicators  Pressure    Pain Type  Chronic pain    Pain Onset  More than a month ago    Pain Frequency  Intermittent    Aggravating Factors   not sure, when getting out of bed it hurts to walk    Multiple Pain Sites  No         [facility] PT Assessment - 04/06/19 0001      Assessment   Medical Diagnosis  C50.412,Z17.0 (ICD-10-CM) - Malignant neoplasm of upper-outer quadrant of left breast in female, estrogen receptor positive ([place])    Referring Provider (PT)  Magrinat, Virgie Dad, MD    Onset Date/Surgical Date  --   several months   Prior Therapy  No      Precautions   Precautions  None      Restrictions   Weight Bearing  Restrictions  No      Balance Screen   Has the patient fallen in the past 6 months  No      [place] residence    Living Arrangements  Children;Other relatives      Prior Function   Level of Independence  Independent    Vocation  On disability      Cognition   Overall Cognitive Status  Within Functional Limits for tasks assessed      Posture/Postural Control   Posture/Postural Control  Postural limitations    Postural Limitations  Increased lumbar lordosis;Anterior pelvic tilt;Rounded Shoulders      ROM / Strength   AROM / PROM / Strength  AROM;PROM;Strength      AROM   Overall AROM Comments  lumbar flexion 50% limited      PROM   Overall PROM Comments  Rt hip flexion and ER 50% limited; Lt hip flexion and ER 30% limited   +pain in all directions     Strength   Overall Strength Comments  core strength 50% limited      Flexibility   Soft Tissue Assessment /Muscle Length  yes    Hamstrings  80% bilateral      Palpation   Palpation comment  TTP and tight: lumbar paraspinals, bilat adductors; scar tissue adhesions in lower abdomen      Ambulation/Gait   Gait Pattern  Lateral trunk lean to right;Lateral trunk lean to left;Decreased stride length                Objective measurements completed on examination: See above findings.    Pelvic Floor Special Questions - 04/06/19 0001    Prior Pelvic/Prostate Exam  Yes    Prior Pregnancies  Yes    Number of Pregnancies  3    Number of C-Sections  3    Currently Sexually Active  No    Marinoff Scale  pain prevents any attempts at intercourse    Urinary Leakage  No    Urinary urgency  --   sometimes at night   Urinary frequency  nocturia    Fecal incontinence  No    Fluid intake  maybe one bottle of water    Caffeine beverages  a lot of soda    Falling out feeling (prolapse)  No    Skin Integrity  --   dryness   Perineal Body/Introitus   Elevated    External Palpation   TTP ischiocavernosis bilateral    Pelvic Floor Internal Exam  pt identiy confirmed and informed consent given to perform internal soft tissue assessment    Exam Type  Vaginal    Sensation  normal    Palpation  TTP indoitus and vestibule with very little pressure;     Strength  weak squeeze, no lift    Strength # of reps  1    Strength # of seconds  3    Tone  high               PT Education - 04/06/19 1516    Education Details  Access Code: [email or web address], moisturizers and given samples of desert harvest, uberlube, and slippery stuff    Person(s) Educated  Patient    Methods  Explanation;Demonstration;Handout;Verbal cues    Comprehension  Verbalized understanding;Returned demonstration       PT Short Term Goals - 04/06/19 1527      PT SHORT TERM GOAL #1   Title  ind with initial HEP    Time  4    Period  Weeks    Status  New    Target Date  05/04/19      PT SHORT TERM GOAL #2   Title  pain decreased by at least 25%    Time  4    Period  Weeks  Status  New    Target Date  05/04/19      PT SHORT TERM GOAL #3   Title  nocturia 2x at most    Time  4    Period  Weeks    Status  New    Target Date  05/04/19        PT Long Term Goals - 04/06/19 1523      PT LONG TERM GOAL #1   Title  Pt will be ind with HEP    Time  12    Period  Weeks    Status  New    Target Date  06/29/19      PT LONG TERM GOAL #2   Title  Pt will report at least 50% less groin and pelvic pain    Time  12    Period  Weeks    Status  New    Target Date  06/29/19      PT LONG TERM GOAL #3   Title  Pt will be able to tolerate complete pelvic exam due to improved soft tissue pliability    Time  12    Period  Weeks    Status  New    Target Date  06/29/19      PT LONG TERM GOAL #4   Title  Pt will report nocturia of 1x at most due to improved pelvic floor strength    Time  12    Period  Weeks    Status  New    Target Date  06/29/19             Plan - 04/06/19 1529     Clinical Impression Statement  Pt presents to skilled PT with main complaint of pelvic pain that prevents her from having pelvic exam.  She also expresses that she would like to meet a partner and be able to be intimate with them, but at this time she is 3/3 on the [facility] scale.  Pt also has groin and low back pain on Rt>Lt side. She states it is worse when walking and getting up out of bed.  Pt has pelvic floor weakness of 2/5 and can hold for 3 seconds for 1 rep.  Pt is very TTP at ischiocavernosis Rt>Lt and adductor attachments.  She has limited hamstring flexibility.  Limited and increased pain with hip flexion and rotation both ways Rt> Lt.  Pt has decreased AROM lumbar flexion that appears to be at least partly due to very tight lumbar paraspinals muscles.  She has excess abdominal adipose making it difficulty to palpate muscle tone. Pt does have scar tissue adhesions from several c-sections.  Pt will benefit from skilled PT to address impairments for improved function and quality of life and reduced pain.    Personal Factors and Comorbidities  Comorbidity 3+    Comorbidities  ER+ cancer, chemo, obesity, back surgery, chronic pain    Examination-Activity Limitations  Stand;Sleep    Stability/Clinical Decision Making  Evolving/Moderate complexity    Clinical Decision Making  Moderate    Rehab Potential  Excellent    PT Frequency  1x / week    PT Duration  12 weeks    PT Treatment/Interventions  ADLs/Self Care Home Management;Biofeedback;Cryotherapy;Electrical Stimulation;Moist Heat;Therapeutic exercise;Therapeutic activities;Neuromuscular re-education;Patient/family education;Manual techniques;Passive range of motion;Scar mobilization;Dry needling;Taping    PT Next Visit Plan  review moisturizing and self massage as needed, STM to adductor and lumbar paraspinals    PT Home Exercise Plan  Access Code: [email or web address]    Consulted and Agree with Plan of Care  Patient       Patient will benefit from  skilled therapeutic intervention in order to improve the following deficits and impairments:  Abnormal gait, Pain, Increased fascial restricitons, Postural dysfunction, Increased muscle spasms, Decreased scar mobility, Decreased coordination, Decreased range of motion, Decreased strength, Obesity, Impaired flexibility, Difficulty walking  Visit Diagnosis: Muscle weakness (generalized)  Abnormal posture  Cramp and spasm     Problem List Patient Active Problem List   Diagnosis Date Noted  . Insomnia 03/03/2019  . Ankle arthritis 12/12/2018  . Muscle weakness 12/12/2018  . Obstructive sleep apnea 10/23/2018  . Asthmatic bronchitis 10/23/2018  . Cellulitis of internal cheek, left 01/23/2018  . Lung nodule 09/15/2017  . Morbid obesity with BMI of 45.0-49.9, adult ([place]) 09/05/2017  . Arthritis of carpometacarpal (CMC) joints of both thumbs 03/14/2017  . 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 Vertigo 10/12/2016  . Peripheral positional vertigo 08/28/2016  . Abnormal auditory perception of both ears 07/27/2016  . Neuropathic pain 06/25/2016  . 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 S/P lumbar spinal fusion 01/27/2016  . H/O therapeutic radiation 09/21/2015  . Personal history of breast cancer 09/21/2015  . Pes planus of both feet 08/04/2015  . Primary osteoarthritis of right knee 03/10/2015  . Osteoarthritis of right knee 02/04/2015  . Primary osteoarthritis of left knee 09/13/2014  . Hot flashes 01/19/2014  . Abdominal pain, unspecified site 01/19/2014  . Malignant neoplasm of upper-outer quadrant of left breast in female, estrogen receptor positive ([place]) 03/19/2013  . Lumbosacral spondylosis without myelopathy 12/30/2012  . Fibromyalgia syndrome 08/15/2012  . Peripheral neuropathy, toxic 08/15/2012  . Lymphedema of arm - left 04/30/2012    Jule Ser, PT 04/06/2019, 3:59 PM  [facility] [address], [place], [place], [address] Phone: [phone]   Fax:  [phone]  Name: Kelsey Wilson MRN: [id number] Date of Birth: December 23, 1960

## 2019-04-06 NOTE — Telephone Encounter (Signed)
Patient called stating she needs a refill on Hydrocodone, #6 left, takes 3 to 4 times daily, bottle says one every 8 hours.

## 2019-04-08 ENCOUNTER — Telehealth: Payer: Self-pay | Admitting: *Deleted

## 2019-04-08 ENCOUNTER — Telehealth: Payer: Self-pay | Admitting: Podiatry

## 2019-04-08 MED ORDER — HYDROCODONE-ACETAMINOPHEN 7.5-325 MG PO TABS: ORAL_TABLET | ORAL | 0 refills | Status: DC

## 2019-04-08 NOTE — Telephone Encounter (Signed)
Hydrocodone was e-scribed to [facility], Kelsey Wilson reports they never received the prescription. Hydrocodone e-scribed to [facility] per patient's preference. Placed a call to Kelsey Wilson regarding the above. She verbalizes understanding.

## 2019-04-08 NOTE — Telephone Encounter (Signed)
PMP was Reviewed. Hydrocodone e-scribed. Placed a call to Ms. Groshong regarding the above

## 2019-04-08 NOTE — Telephone Encounter (Signed)
Pt left message checking status of her diabetic shoes.   I returned call and left message for pt that we have not received the paperwork back from Dr Tinnie Gens still. And that I even mailed it to his office on 10.8.2020

## 2019-04-08 NOTE — Telephone Encounter (Signed)
Patient left a message for Danella Sensing ANP stating that she is all out of her hydrocodone.  She is asking for a refill to be sent to [facility] on [address].

## 2019-04-09 ENCOUNTER — Encounter: Payer: Self-pay | Admitting: Podiatry

## 2019-04-09 ENCOUNTER — Ambulatory Visit (INDEPENDENT_AMBULATORY_CARE_PROVIDER_SITE_OTHER): Payer: Medicare HMO | Admitting: Podiatry

## 2019-04-09 ENCOUNTER — Other Ambulatory Visit: Payer: Self-pay

## 2019-04-09 DIAGNOSIS — M76829 Posterior tibial tendinitis, unspecified leg: Secondary | ICD-10-CM | POA: Diagnosis not present

## 2019-04-09 DIAGNOSIS — M2142 Flat foot [pes planus] (acquired), left foot: Secondary | ICD-10-CM | POA: Diagnosis not present

## 2019-04-09 DIAGNOSIS — M2141 Flat foot [pes planus] (acquired), right foot: Secondary | ICD-10-CM | POA: Diagnosis not present

## 2019-04-09 DIAGNOSIS — M722 Plantar fascial fibromatosis: Secondary | ICD-10-CM | POA: Diagnosis not present

## 2019-04-09 DIAGNOSIS — E1149 Type 2 diabetes mellitus with other diabetic neurological complication: Secondary | ICD-10-CM

## 2019-04-14 ENCOUNTER — Other Ambulatory Visit: Payer: Self-pay | Admitting: Specialist

## 2019-04-14 DIAGNOSIS — Z1231 Encounter for screening mammogram for malignant neoplasm of breast: Secondary | ICD-10-CM

## 2019-04-20 NOTE — Progress Notes (Signed)
Subjective: 58 year old female presents the office today for evaluation of right ankle pain, tendinitis.She said that she still having discomfort.  She states the left side is also been hurting some.  She feels that she gets diabetic shoes and better support this will be helpful.  She still doing physical therapy and this is been helpful as well.  She describes an aching sensation. Denies any systemic complaints such as fevers, chills, nausea, vomiting. No acute changes since last appointment, and no other complaints at this time.   Objective: AAO x3, NAD DP/PT pulses palpable bilaterally, CRT less than 3 seconds There is generalized tenderness both the medial lateral aspects of the ankle with no specific area pinpoint tenderness.  There is discomfort on the anterior ankle joint.  No restriction.  Mild edema to the ankle there is no erythema or warmth.  Decreased medial arch height.  Tenderness on the plantar medial tubercle of the calcaneus at insertion of plantar fascial bilaterally.  Plantar fascial appears to be intact.  Negative Tinel sign. No open lesions or pre-ulcerative lesions.  No pain with calf compression, swelling, warmth, erythema  MRI 02/05/2019 IMPRESSION: 1. Torn anterior talofibular ligament with edema and synovitis which may predispose to anterolateral impingement. 2. Thickened anterior inferior tibiofibular ligament, possibly the result of a prior tear or sprain. 3. Markedly thickened and indistinct superomedial portion of the spring ligament with ill definition of the medioplantar oblique and inferoplantar longitudinal portions of the spring ligament, favoring tearing. 4. Mild distal tibialis posterior tendinopathy, correlate clinically in assessing for tibialis posterior dysfunction. 5. High signal in the deep tibiotalar portion of the deltoid ligament favoring sprain, with ill definition of tibiospring and tibionavicular components likely from chronic tear. 6. 4 mm  non-fragmented osteochondral lesion along the anteromedial talar dome. 7. Mild distal tibialis anterior tendinopathy and mild distal Achilles tendinopathy.   Assessment: Right chronic ankle pain, plantar fasciitis  Plan: -All treatment options discussed with the patient including all alternatives, risks, complications.  -Today steroid injection performed to the plantar fascia.  See procedure note below.  Work on trying to get her diabetic shoes.  Continue physical therapy.  If symptoms continue discussed surgical intervention.  She wants to hold off on surgery for now. -Patient encouraged to call the office with any questions, concerns, change in symptoms.   Procedure: Injection Tendon/Ligament Discussed alternatives, risks, complications and verbal consent was obtained.  Location: Bilateral plantar fascia at the glabrous junction; medial approach. Skin Prep: Alcohol. Injectate: 0.5cc 0.5% marcaine plain, 0.5 cc 2% lidocaine plain and, 1 cc kenalog 10. Disposition: Patient tolerated procedure well. Injection site dressed with a band-aid.  Post-injection care was discussed and return precautions discussed.   Trula Slade DPM

## 2019-04-21 ENCOUNTER — Other Ambulatory Visit: Payer: Self-pay

## 2019-04-21 ENCOUNTER — Ambulatory Visit: Payer: Medicare HMO | Admitting: Physical Therapy

## 2019-04-21 DIAGNOSIS — M6281 Muscle weakness (generalized): Secondary | ICD-10-CM | POA: Diagnosis not present

## 2019-04-21 DIAGNOSIS — R293 Abnormal posture: Secondary | ICD-10-CM

## 2019-04-21 DIAGNOSIS — R252 Cramp and spasm: Secondary | ICD-10-CM

## 2019-04-21 NOTE — Therapy (Signed)
[facility] [address], [address] [place], [place], [address] Phone: [phone]   Fax:  [phone]  Physical Therapy Treatment  Patient Details  Name: Kelsey Wilson MRN: [id number] Date of Birth: 12/26/1960 Referring Provider (PT): Magrinat, Gustav C, MD   Encounter Date: 04/21/2019  PT End of Session - 04/21/19 1150    Visit Number  2    Date for PT Re-Evaluation  06/29/19    PT Start Time  1149    PT Stop Time  1227    PT Time Calculation (min)  38 min    Activity Tolerance  Patient tolerated treatment well    Behavior During Therapy  WFL for tasks assessed/performed       Past Medical History:  Diagnosis Date  . Anemia   . Anxiety   . Arthritis   . Breast cancer (HCC) 2010   T3N1 invasive ductal carcinoma left breast.Takes Arimidex daily  . Bursitis   . Carpal tunnel syndrome   . Chronic back pain    stenosis  . Constipation    takes Colace daily  . Depression    takes Benzotropine daily  . Diverticulitis of colon   . Dyspnea    daily when walking for over 1 yr.  . Fibromyalgia 08/2012  . GERD (gastroesophageal reflux disease)    takes Dexilant daily  . Hemorrhoid   . History of blood transfusion    no abnormal reaction noted  . History of colon polyps    benign  . History of shingles   . Joint pain   . Joint swelling   . Night muscle spasms    takes Flexeril nightly as needed  . Nocturia   . OSA (obstructive sleep apnea)   . OSA on CPAP   . Peripheral edema    takes Furosemide.Just started 01/18/16  . Peripheral neuropathy    takes Lyrica daily  . Personal history of chemotherapy    2013  . Personal history of radiation therapy    2013  . Pneumonia    hx of-2015  . Pseudoarthrosis of lumbar spine   . Seasonal allergies    takes Singulair nightly  . SOB (shortness of breath) on exertion    rarely with exertion  . 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 Pavelock)    Past Surgical History:  Procedure Laterality Date  . ABDOMINAL HYSTERECTOMY     still has ovaries  . APPENDECTOMY    . AXILLARY LYMPH NODE DISSECTION  11/28/2011   Procedure: AXILLARY LYMPH NODE DISSECTION;  Surgeon: Benjamin T Hoxworth, MD;  Location: [facility];  Service: General;  Laterality: Left;  . BREAST LUMPECTOMY Left 11/28/2011   Malignant  . BREAST SURGERY Left 2013  . CARPAL TUNNEL RELEASE     Bilateral  . CESAREAN SECTION     pt. has had 3  . CHOLECYSTECTOMY    . COLONOSCOPY    . EYE SURGERY Bilateral    cataract removal  . KNEE SURGERY     Left Knee  . LAMINECTOMY WITH POSTERIOR LATERAL ARTHRODESIS LEVEL 1 N/A 11/29/2017   Procedure: Posterior Lateral Fusion - Lumbar Four-Lumbar Five, removal and replacement of hardware, Laminectomy - Lumbar Four-Lumbar Five;  Surgeon: Jones, David S, MD;  Location: [facility];  Service: Neurosurgery;  Laterality: N/A;  . LAMINECTOMY WITH POSTERIOR LATERAL ARTHRODESIS LEVEL 2 N/A 06/07/2017     Procedure: Posterior Lateral Fusion Lumbar Three-Four and Transforaminal Interbody Fusion Lumbar Four-Five with Segmental  Pedicle Screw Fixation;  Surgeon: Jones, David S, MD;  Location: [facility];  Service: Neurosurgery;  Laterality: N/A;  Posterior Lateral Fusion Lumbar Three-Four and Transforaminal Interbody Fusion Lumbar Four-Five with Segmental  Pedicle Screw Fixation   . LUMBAR FUSION  06/07/2017   POST  . LUMBAR LAMINECTOMY/DECOMPRESSION MICRODISCECTOMY Bilateral 01/27/2016   Procedure: Laminectomy and Foraminotomy - Lumbar four -Lumbar five - bilateral- on-lay noninstrumented fusion;  Surgeon: David S Jones, MD;  Location: [facility];  Service: Neurosurgery;  Laterality: Bilateral;  . MULTIPLE EXTRACTIONS WITH ALVEOLOPLASTY N/A 05/11/2016   Procedure: EXTRACTION OF TEETH EIGHTEEN, TWENTY AND TWENTY- NINE;  REMOVAL OF MANDIBULAR TORUS AND EXOSTOSIS;   Surgeon: Scott Jensen, DDS;  Location: [facility];  Service: Oral Surgery;  Laterality: N/A;  . PORTACATH PLACEMENT  06/18/2011   Procedure: INSERTION PORT-A-CATH;  Surgeon: Benjamin T Hoxworth, MD;  Location: [facility];  Service: General;  Laterality: Right;  right subclavian  . removal portacath  2014  . spur     Apex spur on both big toes  . TOE SURGERY Bilateral   . TONSILLECTOMY    . TOTAL KNEE ARTHROPLASTY Left 09/13/2014  . TOTAL KNEE ARTHROPLASTY Left 09/13/2014   Procedure: LEFT TOTAL KNEE ARTHROPLASTY;  Surgeon: Brian Swinteck, MD;  Location: [facility];  Service: Orthopedics;  Laterality: Left;  . TOTAL KNEE ARTHROPLASTY Right 03/10/2015   Procedure: RIGHT TOTAL KNEE ARTHROPLASTY;  Surgeon: Brian Swinteck, MD;  Location: [facility];  Service: Orthopedics;  Laterality: Right;    There were no vitals filed for this visit.  Subjective Assessment - 04/21/19 1152    Subjective  Pt has 7 grandchildren with her all of the time so she has a hard time doing things on her.  Pt did the moisturizing one time, but it is hard to find time alone.  Pt states she has pain in the side of the left LE when bending or lifting the leg up.    Patient Stated Goals  be able to have a pelvic exam next year    Currently in Pain?  No/denies                       [facility] Adult PT Treatment/Exercise - 04/21/19 0001      Exercises   Exercises  Lumbar      Lumbar Exercises: Stretches   Active Hamstring Stretch  Right;Left;3 reps;30 seconds    Lower Trunk Rotation  3 reps    Hip Flexor Stretch  Right;Left;3 reps;30 seconds      Manual Therapy   Manual Therapy  Myofascial release    Manual therapy comments  sacral and gluteal region bilateral - Lt>Rt             PT Education - 04/21/19 1228    Education Details  Access Code: [email or web address]    Person(s) Educated  Patient    Methods  Explanation;Demonstration;Handout;Verbal cues    Comprehension  Verbalized understanding;Returned  demonstration       PT Short Term Goals - 04/21/19 1233      PT SHORT TERM GOAL #1   Title  ind with initial HEP    Status  On-going      PT SHORT TERM GOAL #2   Title  pain decreased by at least 25%    Status  On-going        PT Long Term Goals -   04/06/19 1523      PT LONG TERM GOAL #1   Title  Pt will be ind with HEP    Time  12    Period  Weeks    Status  New    Target Date  06/29/19      PT LONG TERM GOAL #2   Title  Pt will report at least 50% less groin and pelvic pain    Time  12    Period  Weeks    Status  New    Target Date  06/29/19      PT LONG TERM GOAL #3   Title  Pt will be able to tolerate complete pelvic exam due to improved soft tissue pliability    Time  12    Period  Weeks    Status  New    Target Date  06/29/19      PT LONG TERM GOAL #4   Title  Pt will report nocturia of 1x at most due to improved pelvic floor strength    Time  12    Period  Weeks    Status  New    Target Date  06/29/19            Plan - 04/21/19 1229    Clinical Impression Statement  Pt responded well to myofascial release to lumbar region with improved sacral position.  Has decreased sacral anterior rotation.  Pt was educated in more stretches to maintain improved soft tissue length.  Pt will benefit from skilled  PT to contiue working on functinal goals.  No goals met yet due to intial treatment since eval.    Comorbidities  ER+ cancer, chemo, obesity, back surgery, chronic pain    PT Treatment/Interventions  ADLs/Self Care Home Management;Biofeedback;Cryotherapy;Electrical Stimulation;Moist Heat;Therapeutic exercise;Therapeutic activities;Neuromuscular re-education;Patient/family education;Manual techniques;Passive range of motion;Scar mobilization;Dry needling;Taping    PT Next Visit Plan  f/u on stretches, begin pelvic floor strengthening exercises as tolerated    PT Home Exercise Plan  Access Code: [email or web address]    Consulted and Agree with Plan of Care  Patient        Patient will benefit from skilled therapeutic intervention in order to improve the following deficits and impairments:  Abnormal gait, Pain, Increased fascial restricitons, Postural dysfunction, Increased muscle spasms, Decreased scar mobility, Decreased coordination, Decreased range of motion, Decreased strength, Obesity, Impaired flexibility, Difficulty walking  Visit Diagnosis: Muscle weakness (generalized)  Abnormal posture  Cramp and spasm     Problem List Patient Active Problem List   Diagnosis Date Noted  . Insomnia 03/03/2019  . Ankle arthritis 12/12/2018  . Muscle weakness 12/12/2018  . Obstructive sleep apnea 10/23/2018  . Asthmatic bronchitis 10/23/2018  . Cellulitis of internal cheek, left 01/23/2018  . Lung nodule 09/15/2017  . Morbid obesity with BMI of 45.0-49.9, adult (HCC) 09/05/2017  . Arthritis of carpometacarpal (CMC) joints of both thumbs 03/14/2017  . 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 Vertigo 10/12/2016  . Peripheral positional vertigo 08/28/2016  . Abnormal auditory perception of both ears 07/27/2016  . Neuropathic pain 06/25/2016  . 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 S/P lumbar spinal fusion 01/27/2016  . H/O therapeutic radiation 09/21/2015  . Personal history of breast cancer 09/21/2015  . Pes planus of both feet 08/04/2015  . Primary osteoarthritis of right knee 03/10/2015  . Osteoarthritis of right knee 02/04/2015  . Primary osteoarthritis of left knee 09/13/2014  . Hot flashes 01/19/2014  . Abdominal pain, unspecified   site 01/19/2014  . Malignant neoplasm of upper-outer quadrant of left breast in female, estrogen receptor positive (HCC) 03/19/2013  . Lumbosacral spondylosis without myelopathy 12/30/2012  . Fibromyalgia syndrome 08/15/2012  . Peripheral neuropathy, toxic 08/15/2012  . Lymphedema of arm - left 04/30/2012    Jakki L Desenglau, PT 04/21/2019, 12:42 PM  [facility] [address], [address] , [place], [address] Phone: [phone]   Fax:  [phone]  Name: Kelsey Wilson MRN: [id number] Date of Birth: 09/20/1960   

## 2019-04-21 NOTE — Patient Instructions (Addendum)
Access Code: [email or web address]  URL: [email or web address]  Date: 04/21/2019  Prepared by: Jari Favre   Exercises  Supine Diaphragmatic Breathing with Pelvic Floor Lengthening - 10 reps - 1 sets - 3x daily - 7x weekly  Seated Hamstring Stretch - 3 reps - 1 sets - 30 sec hold - 1x daily - 7x weekly  Supine Butterfly Groin Stretch - 3 reps - 1 sets - 30 sec hold - 1x daily - 7x weekly  Supine Hip Internal and External Rotation - 10 reps - 1 sets - 5 sec hold - 1x daily - 7x weekly  Supine Lower Trunk Rotation - 10 reps - 1 sets - 5 sec hold - 1x daily - 7x weekly  Standing Hamstring Stretch with Step - 3 reps - 1 sets - 30 sec hold - 1x daily - 7x weekly  Hip Flexor Stretch on Step - 3 reps - 1 sets - 30 sec hold - 1x daily - 7x weekly  Hooklying Single Knee to Chest Stretch - 5 reps - 1 sets - 20 sec hold - 1x daily - 7x weekly

## 2019-04-22 ENCOUNTER — Telehealth: Payer: Self-pay | Admitting: Podiatry

## 2019-04-22 NOTE — Telephone Encounter (Signed)
Pt came in and picked up diabetic shoe paperwork to take to pcp (Dr Tinnie Gens).  Then I recived a call from pts np and she was questioning why they needed to sign the paperwork and I explained that we treat pts feet and Dr Jacqualyn Posey had recommended diabetic shoes for pt but we do not treat pts diabetes and her md/do needs to sign off on the paperwork stating pt does have diabetes and they are under there treatment. She then cave me Dr Angelica Pou as the pcp there and I have resubmitted the paperwork under that doctors name and will fax to them.

## 2019-04-23 ENCOUNTER — Encounter: Payer: Medicare HMO | Attending: Physical Medicine & Rehabilitation | Admitting: Registered Nurse

## 2019-04-23 ENCOUNTER — Other Ambulatory Visit: Payer: Self-pay

## 2019-04-23 VITALS — BP 122/84 | HR 75 | Temp 97.7°F | Ht 63.0 in | Wt 265.0 lb

## 2019-04-23 DIAGNOSIS — M48061 Spinal stenosis, lumbar region without neurogenic claudication: Secondary | ICD-10-CM | POA: Diagnosis not present

## 2019-04-23 DIAGNOSIS — Z9889 Other specified postprocedural states: Secondary | ICD-10-CM | POA: Diagnosis not present

## 2019-04-23 DIAGNOSIS — M797 Fibromyalgia: Secondary | ICD-10-CM | POA: Diagnosis not present

## 2019-04-23 DIAGNOSIS — F329 Major depressive disorder, single episode, unspecified: Secondary | ICD-10-CM | POA: Diagnosis not present

## 2019-04-23 DIAGNOSIS — G4733 Obstructive sleep apnea (adult) (pediatric): Secondary | ICD-10-CM | POA: Diagnosis not present

## 2019-04-23 DIAGNOSIS — Z5181 Encounter for therapeutic drug level monitoring: Secondary | ICD-10-CM | POA: Diagnosis not present

## 2019-04-23 DIAGNOSIS — K219 Gastro-esophageal reflux disease without esophagitis: Secondary | ICD-10-CM | POA: Insufficient documentation

## 2019-04-23 DIAGNOSIS — M47817 Spondylosis without myelopathy or radiculopathy, lumbosacral region: Secondary | ICD-10-CM

## 2019-04-23 DIAGNOSIS — M62838 Other muscle spasm: Secondary | ICD-10-CM

## 2019-04-23 DIAGNOSIS — G8929 Other chronic pain: Secondary | ICD-10-CM | POA: Insufficient documentation

## 2019-04-23 DIAGNOSIS — M545 Low back pain: Secondary | ICD-10-CM | POA: Insufficient documentation

## 2019-04-23 DIAGNOSIS — G8918 Other acute postprocedural pain: Secondary | ICD-10-CM | POA: Insufficient documentation

## 2019-04-23 DIAGNOSIS — Z79891 Long term (current) use of opiate analgesic: Secondary | ICD-10-CM | POA: Diagnosis present

## 2019-04-23 DIAGNOSIS — M961 Postlaminectomy syndrome, not elsewhere classified: Secondary | ICD-10-CM

## 2019-04-23 DIAGNOSIS — Z981 Arthrodesis status: Secondary | ICD-10-CM

## 2019-04-23 DIAGNOSIS — Z853 Personal history of malignant neoplasm of breast: Secondary | ICD-10-CM | POA: Diagnosis not present

## 2019-04-23 DIAGNOSIS — G894 Chronic pain syndrome: Secondary | ICD-10-CM

## 2019-04-23 MED ORDER — HYDROCODONE-ACETAMINOPHEN 7.5-325 MG PO TABS: ORAL_TABLET | ORAL | 0 refills | Status: DC

## 2019-04-23 MED ORDER — PREGABALIN 100 MG PO CAPS: ORAL_CAPSULE | ORAL | 5 refills | Status: DC

## 2019-04-23 NOTE — Progress Notes (Signed)
Subjective:    Patient ID: Kelsey Wilson, female    DOB: 01-26-1961, 58 y.o.   MRN: [id number]  HPI: Kelsey Wilson is a 58 y.o. female who returns for follow up appointment for chronic pain and medication refill. She states her  pain is located in her lower back and bilateral ankles. She rates her pain 7. Her current exercise regime is walking and performing stretching exercises.  Ms. Wissner Morphine equivalent is 22.73  MME.  UDS Ordered today.   Pain Inventory Average Pain 7 Pain Right Now 7 My pain is aching  In the last 24 hours, has pain interfered with the following? General activity 1 Relation with others 1 Enjoyment of life 1 What TIME of day is your pain at its worst? all Sleep (in general) Fair  Pain is worse with: walking, bending, sitting, inactivity and standing Pain improves with: rest, therapy/exercise and pacing activities Relief from Meds: 5  Mobility walk without assistance how many minutes can you walk? very little do you drive?  yes  Function I need assistance with the following:  household duties and shopping  Neuro/Psych numbness spasms  Prior Studies Any changes since last visit?  no  Physicians involved in your care Any changes since last visit?  no   Family History  Problem Relation Age of Onset  . Hypertension Mother   . Diabetes Mother   . Hypertension Father   . Diabetes Father   . Cancer Paternal Grandmother        unknown  . Colon cancer Neg Hx    Social History   Socioeconomic History  . Marital status: Single    Spouse name: Not on file  . Number of children: 3  . Years of education: Not on file  . Highest education level: Not on file  Occupational History  . Not on file  Social Needs  . Financial resource strain: Not on file  . Food insecurity    Worry: Not on file    Inability: Not on file  . Transportation needs    Medical: Not on file    Non-medical: Not on file  Tobacco Use  . Smoking status:  Never Smoker  . Smokeless tobacco: Never Used  Substance and Sexual Activity  . Alcohol use: No    Alcohol/week: 0.0 standard drinks  . Drug use: No  . Sexual activity: Never    Birth control/protection: Post-menopausal, Surgical  Lifestyle  . Physical activity    Days per week: Not on file    Minutes per session: Not on file  . Stress: Not on file  Relationships  . Social Herbalist on phone: Not on file    Gets together: Not on file    Attends religious service: Not on file    Active member of club or organization: Not on file    Attends meetings of clubs or organizations: Not on file    Relationship status: Not on file  Other Topics Concern  . Not on file  Social History Narrative  . Not on file   Past Surgical History:  Procedure Laterality Date  . ABDOMINAL HYSTERECTOMY     still has ovaries  . APPENDECTOMY    . AXILLARY LYMPH NODE DISSECTION  11/28/2011   Procedure: AXILLARY LYMPH NODE DISSECTION;  Surgeon: Edward Jolly, MD;  Location: [facility];  Service: General;  Laterality: Left;  . BREAST LUMPECTOMY Left 11/28/2011   Malignant  . BREAST SURGERY Left  2013  . CARPAL TUNNEL RELEASE     Bilateral  . CESAREAN SECTION     pt. has had 3  . CHOLECYSTECTOMY    . COLONOSCOPY    . EYE SURGERY Bilateral    cataract removal  . KNEE SURGERY     Left Knee  . LAMINECTOMY WITH POSTERIOR LATERAL ARTHRODESIS LEVEL 1 N/A 11/29/2017   Procedure: Posterior Lateral Fusion - Lumbar Four-Lumbar Five, removal and replacement of hardware, Laminectomy - Lumbar Four-Lumbar Five;  Surgeon: Eustace Moore, MD;  Location: [facility];  Service: Neurosurgery;  Laterality: N/A;  . LAMINECTOMY WITH POSTERIOR LATERAL ARTHRODESIS LEVEL 2 N/A 06/07/2017   Procedure: Posterior Lateral Fusion Lumbar Three-Four and Transforaminal Interbody Fusion Lumbar Four-Five with Segmental  Pedicle Screw Fixation;  Surgeon: Eustace Moore, MD;  Location: [facility];  Service: Neurosurgery;  Laterality: N/A;   Posterior Lateral Fusion Lumbar Three-Four and Transforaminal Interbody Fusion Lumbar Four-Five with Segmental  Pedicle Screw Fixation   . LUMBAR FUSION  06/07/2017   POST  . LUMBAR LAMINECTOMY/DECOMPRESSION MICRODISCECTOMY Bilateral 01/27/2016   Procedure: Laminectomy and Foraminotomy - Lumbar four -Lumbar five - bilateral- on-lay noninstrumented fusion;  Surgeon: Eustace Moore, MD;  Location: [facility];  Service: Neurosurgery;  Laterality: Bilateral;  . MULTIPLE EXTRACTIONS WITH ALVEOLOPLASTY N/A 05/11/2016   Procedure: EXTRACTION OF TEETH EIGHTEEN, TWENTY AND TWENTY- NINE;  REMOVAL OF MANDIBULAR TORUS AND EXOSTOSIS;  Surgeon: Diona Browner, DDS;  Location: [facility];  Service: Oral Surgery;  Laterality: N/A;  . PORTACATH PLACEMENT  06/18/2011   Procedure: INSERTION PORT-A-CATH;  Surgeon: Edward Jolly, MD;  Location: [facility];  Service: General;  Laterality: Right;  right subclavian  . removal portacath  2014  . spur     Apex spur on both big toes  . TOE SURGERY Bilateral   . TONSILLECTOMY    . TOTAL KNEE ARTHROPLASTY Left 09/13/2014  . TOTAL KNEE ARTHROPLASTY Left 09/13/2014   Procedure: LEFT TOTAL KNEE ARTHROPLASTY;  Surgeon: Rod Can, MD;  Location: [facility];  Service: Orthopedics;  Laterality: Left;  . TOTAL KNEE ARTHROPLASTY Right 03/10/2015   Procedure: RIGHT TOTAL KNEE ARTHROPLASTY;  Surgeon: Rod Can, MD;  Location: [facility];  Service: Orthopedics;  Laterality: Right;   Past Medical History:  Diagnosis Date  . Anemia   . Anxiety   . Arthritis   . Breast cancer ([place]) 2010   T3N1 invasive ductal carcinoma left breast.Takes Arimidex daily  . Bursitis   . Carpal tunnel syndrome   . Chronic back pain    stenosis  . Constipation    takes Colace daily  . Depression    takes Benzotropine daily  . Diverticulitis of colon   . Dyspnea    daily when walking for over 1 yr.  . Fibromyalgia 08/2012  . GERD (gastroesophageal reflux disease)    takes Dexilant  daily  . Hemorrhoid   . History of blood transfusion    no abnormal reaction noted  . History of colon polyps    benign  . History of shingles   . Joint pain   . Joint swelling   . Night muscle spasms    takes Flexeril nightly as needed  . Nocturia   . OSA (obstructive sleep apnea)   . OSA on CPAP   . Peripheral edema    takes Furosemide.Just started 01/18/16  . Peripheral neuropathy    takes Lyrica daily  . Personal history of chemotherapy    2013  .  Personal history of radiation therapy    2013  . Pneumonia    hx of-2015  . Pseudoarthrosis of lumbar spine   . Seasonal allergies    takes Singulair nightly  . SOB (shortness of breath) on exertion    rarely with exertion  . 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 Alphonzo Grieve)   BP 122/84   Pulse 75   Temp 97.7 F (36.5 C)   Ht 5\' 3"  (1.6 m)   Wt 265 lb (120.2 kg)   SpO2 95%   BMI 46.94 kg/m   Opioid Risk Score:   Fall Risk Score:  `1  Depression screen PHQ 2/9  Depression screen [facility] 2/9 08/04/2018 06/06/2018 04/04/2018 01/13/2018 12/13/2017 06/21/2017 03/07/2017  Decreased Interest 0 0 0 3 3 1 2   Down, Depressed, Hopeless 0 0 0 3 3 1 2   PHQ - 2 Score 0 0 0 6 6 2 4   Altered sleeping - - - - - - 0  Tired, decreased energy - - - - - - 3  Change in appetite - - - - - - 3  Feeling bad or failure about yourself  - - - - - - 2  Trouble concentrating - - - - - - 3  Moving slowly or fidgety/restless - - - - - - 3  Suicidal thoughts - - - - - - 0  PHQ-9 Score - - - - - - 18  Difficult doing work/chores - - - - - - Extremely dIfficult  Some recent data might be hidden    Review of Systems  Musculoskeletal:       Spasms  Neurological: Positive for numbness.  All other systems reviewed and are negative.      Objective:   Physical Exam Vitals signs and nursing note reviewed.  Constitutional:       Appearance: Normal appearance.  Neck:     Musculoskeletal: Normal range of motion and neck supple.     Comments: Cervical Paraspinal Tenderness: C-5-C-6 Cardiovascular:     Rate and Rhythm: Normal rate and regular rhythm.     Pulses: Normal pulses.     Heart sounds: Normal heart sounds.  Pulmonary:     Effort: Pulmonary effort is normal.     Breath sounds: Normal breath sounds.  Musculoskeletal:     Comments: Normal Muscle Bulk and Muscle Testing Reveals:  Upper Extremities: Full ROM and Muscle Strength 5/5 Bilateral AC Joint Tenderness Lumbar Paraspinal Tenderness: L-3-L-5 Lower Extremities: Full ROM and Muscle Strength 5/5 Arises from Chair slowly Antalgic  Gait   Skin:    General: Skin is warm and dry.  Neurological:     Mental Status: She is alert and oriented to person, place, and time.  Psychiatric:        Mood and Affect: Mood normal.        Behavior: Behavior normal.           Assessment & Plan:  1. Lumbar Postlaminectomy/ S/P Lumbar Fusion:Spinal Stenosis: S/PPosterior Lateral Fusion L-4-L-5 removal and replacement of hardware, Laminectomy L4-L5 by Dr. Ronnald Ramp on 04/22/2018.  Continue HEP as Tolerated and Continue current medication regime. Continue withSlow Weaning:Hydrocodone7.5/325 mg one tablet every 6 hours as needed#110.No script given instructed to call office in two weeks. She verbalizes understanding. 03/23/2019 2. Lumbar Radiculitis: Continue current medication regime with Lyrica.04/23/2019 3. Fibromyalgia:ContinueHEP as Tolerated.Continue Current Medication Regimen with Lyrica.04/23/2019 4.RightGreater  Trochanter Bursitis: No complaints today. Continue to Alternate Ice and Heat Therapy.04/23/2019. 5. Muscle Spasm:Continue:Flexeril..Continue to Monitor.04/23/2019. 6. Chronic Pain Syndrome: Continue Current medication regimen and HEP as Tolerated. Continue to Monitor.04/23/2019. 7. Bilateral OA of Bilateral Knees: Orthopedist  following.Continue to Monitor.04/23/2019. 8. Bilateral Hand Pain:No complaints today.Continue to Monitor/ ? OA: Continue to Monitor.04/23/2019 9. Cervicalgia:Continue to alternate Heat/Ice Therapy. Continue HEP as Tolerated.03/23/2019. 10. Chronic Bilateral Shoulders:No complaints today.Continue to alternate Ice/ Heat therapy and HEP as Tolerated.04/23/2019. 11. Bilateral Ankle Pain: Podiatry Following:Continue to Monitor. 04/23/2019  22minutes of Face to Face Patient Care time was spent during this visit. All questions encouraged and answered,   F/U in 1 month

## 2019-04-24 ENCOUNTER — Encounter: Payer: Self-pay | Admitting: Registered Nurse

## 2019-04-26 ENCOUNTER — Other Ambulatory Visit: Payer: Self-pay | Admitting: Cardiology

## 2019-04-26 DIAGNOSIS — Z20822 Contact with and (suspected) exposure to covid-19: Secondary | ICD-10-CM

## 2019-04-27 LAB — SPECIMEN STATUS REPORT

## 2019-04-27 LAB — NOVEL CORONAVIRUS, NAA: SARS-CoV-2, NAA: NOT DETECTED

## 2019-04-28 ENCOUNTER — Encounter: Payer: Medicare HMO | Admitting: Physical Therapy

## 2019-04-29 LAB — TOXASSURE SELECT,+ANTIDEPR,UR

## 2019-05-04 ENCOUNTER — Telehealth: Payer: Self-pay | Admitting: *Deleted

## 2019-05-04 NOTE — Telephone Encounter (Signed)
Urine drug screen for this encounter is consistent for prescribed medication 

## 2019-05-05 ENCOUNTER — Encounter: Payer: Self-pay | Admitting: Physical Therapy

## 2019-05-05 ENCOUNTER — Ambulatory Visit: Payer: Medicare HMO | Attending: Oncology | Admitting: Physical Therapy

## 2019-05-05 ENCOUNTER — Other Ambulatory Visit: Payer: Self-pay

## 2019-05-05 DIAGNOSIS — R252 Cramp and spasm: Secondary | ICD-10-CM | POA: Diagnosis present

## 2019-05-05 DIAGNOSIS — R293 Abnormal posture: Secondary | ICD-10-CM | POA: Diagnosis present

## 2019-05-05 DIAGNOSIS — M6281 Muscle weakness (generalized): Secondary | ICD-10-CM | POA: Diagnosis present

## 2019-05-05 NOTE — Therapy (Signed)
[id number] [facility] [address], [place], [place], [address] Phone: [phone]   Fax:  [phone]  Physical Therapy Treatment  Patient Details  Name: Kelsey Wilson MRN: [id number] Date of Birth: 1961/02/26 Referring Provider (PT): Magrinat, Virgie Dad, MD   Encounter Date: 05/05/2019  PT End of Session - 05/05/19 1100    Visit Number  3    Date for PT Re-Evaluation  06/29/19    PT Start Time  1100    PT Stop Time  1140    PT Time Calculation (min)  40 min    Activity Tolerance  Patient tolerated treatment well    Behavior During Therapy  [facility] for tasks assessed/performed       Past Medical History:  Diagnosis Date  . Anemia   . Anxiety   . Arthritis   . Breast cancer ([place]) 2010   T3N1 invasive ductal carcinoma left breast.Takes Arimidex daily  . Bursitis   . Carpal tunnel syndrome   . Chronic back pain    stenosis  . Constipation    takes Colace daily  . Depression    takes Benzotropine daily  . Diverticulitis of colon   . Dyspnea    daily when walking for over 1 yr.  . Fibromyalgia 08/2012  . GERD (gastroesophageal reflux disease)    takes Dexilant daily  . Hemorrhoid   . History of blood transfusion    no abnormal reaction noted  . History of colon polyps    benign  . History of shingles   . Joint pain   . Joint swelling   . Night muscle spasms    takes Flexeril nightly as needed  . Nocturia   . OSA (obstructive sleep apnea)   . OSA on CPAP   . Peripheral edema    takes Furosemide.Just started 01/18/16  . Peripheral neuropathy    takes Lyrica daily  . Personal history of chemotherapy    2013  . Personal history of radiation therapy    2013  . Pneumonia    hx of-2015  . Pseudoarthrosis of lumbar spine   . Seasonal allergies    takes Singulair nightly  . SOB (shortness of breath) on exertion    rarely with exertion  . 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 Alphonzo Grieve)    Past Surgical History:  Procedure Laterality Date  . ABDOMINAL HYSTERECTOMY     still has ovaries  . APPENDECTOMY    . AXILLARY LYMPH NODE DISSECTION  11/28/2011   Procedure: AXILLARY LYMPH NODE DISSECTION;  Surgeon: Edward Jolly, MD;  Location: [place];  Service: General;  Laterality: Left;  . BREAST LUMPECTOMY Left 11/28/2011   Malignant  . BREAST SURGERY Left 2013  . CARPAL TUNNEL RELEASE     Bilateral  . CESAREAN SECTION     pt. has had 3  . CHOLECYSTECTOMY    . COLONOSCOPY    . EYE SURGERY Bilateral    cataract removal  . KNEE SURGERY     Left Knee  . LAMINECTOMY WITH POSTERIOR LATERAL ARTHRODESIS LEVEL 1 N/A 11/29/2017   Procedure: Posterior Lateral Fusion - Lumbar Four-Lumbar Five, removal and replacement of hardware, Laminectomy - Lumbar Four-Lumbar Five;  Surgeon: Eustace Moore, MD;  Location: [facility];  Service: Neurosurgery;  Laterality: N/A;  . LAMINECTOMY WITH POSTERIOR LATERAL ARTHRODESIS LEVEL 2 N/A 06/07/2017  Procedure: Posterior Lateral Fusion Lumbar Three-Four and Transforaminal Interbody Fusion Lumbar Four-Five with Segmental  Pedicle Screw Fixation;  Surgeon: Eustace Moore, MD;  Location: [place];  Service: Neurosurgery;  Laterality: N/A;  Posterior Lateral Fusion Lumbar Three-Four and Transforaminal Interbody Fusion Lumbar Four-Five with Segmental  Pedicle Screw Fixation   . LUMBAR FUSION  06/07/2017   POST  . LUMBAR LAMINECTOMY/DECOMPRESSION MICRODISCECTOMY Bilateral 01/27/2016   Procedure: Laminectomy and Foraminotomy - Lumbar four -Lumbar five - bilateral- on-lay noninstrumented fusion;  Surgeon: Eustace Moore, MD;  Location: [place] [facility];  Service: Neurosurgery;  Laterality: Bilateral;  . MULTIPLE EXTRACTIONS WITH ALVEOLOPLASTY N/A 05/11/2016   Procedure: EXTRACTION OF TEETH EIGHTEEN, TWENTY AND TWENTY- NINE;  REMOVAL OF MANDIBULAR TORUS AND EXOSTOSIS;   Surgeon: Diona Browner, DDS;  Location: [place];  Service: Oral Surgery;  Laterality: N/A;  . PORTACATH PLACEMENT  06/18/2011   Procedure: INSERTION PORT-A-CATH;  Surgeon: Edward Jolly, MD;  Location: [place];  Service: General;  Laterality: Right;  right subclavian  . removal portacath  2014  . spur     Apex spur on both big toes  . TOE SURGERY Bilateral   . TONSILLECTOMY    . TOTAL KNEE ARTHROPLASTY Left 09/13/2014  . TOTAL KNEE ARTHROPLASTY Left 09/13/2014   Procedure: LEFT TOTAL KNEE ARTHROPLASTY;  Surgeon: Rod Can, MD;  Location: [place];  Service: Orthopedics;  Laterality: Left;  . TOTAL KNEE ARTHROPLASTY Right 03/10/2015   Procedure: RIGHT TOTAL KNEE ARTHROPLASTY;  Surgeon: Rod Can, MD;  Location[facility];  Service: Orthopedics;  Laterality: Right;    There were no vitals filed for this visit.  Subjective Assessment - 05/05/19 1103    Subjective  I have some back pain like usual.  The stretches have been working because I can do those a little throughout the day.  Pt states she had forgot where she was going when getting here today, but then it came to her.  Just felt a little off this morning    Patient Stated Goals  be able to have a pelvic exam next year    Currently in Pain?  Yes    Pain Score  7     Pain Location  Back    Multiple Pain Sites  No                       [facility] Adult PT Treatment/Exercise - 05/05/19 0001      Lumbar Exercises: Seated   Other Seated Lumbar Exercises  thoracic extension - 10 x 5 sec    Other Seated Lumbar Exercises  breathing and releaxing pelvic floor      Manual Therapy   Manual Therapy  Myofascial release;Internal Pelvic Floor;Soft tissue mobilization    Manual therapy comments  pt identity confirmed and informed consent given    Soft tissue mobilization  bilat adductor attachment and bilat ischiocavernosis    Myofascial Release  Lt >Rt bulbocavernosis and ischiocav, urethra    Internal Pelvic  Floor  see above               PT Short Term Goals - 04/21/19 1233      PT SHORT TERM GOAL #1   Title  ind with initial HEP    Status  On-going      PT SHORT TERM GOAL #2   Title  pain decreased by at least 25%    Status  On-going  PT Long Term Goals - 04/06/19 1523      PT LONG TERM GOAL #1   Title  Pt will be ind with HEP    Time  12    Period  Weeks    Status  New    Target Date  06/29/19      PT LONG TERM GOAL #2   Title  Pt will report at least 50% less groin and pelvic pain    Time  12    Period  Weeks    Status  New    Target Date  06/29/19      PT LONG TERM GOAL #3   Title  Pt will be able to tolerate complete pelvic exam due to improved soft tissue pliability    Time  12    Period  Weeks    Status  New    Target Date  06/29/19      PT LONG TERM GOAL #4   Title  Pt will report nocturia of 1x at most due to improved pelvic floor strength    Time  12    Period  Weeks    Status  New    Target Date  06/29/19            Plan - 05/05/19 1331    Clinical Impression Statement  Pt responded well to myofascial release to pelvic floor.  Tissue is very stenotic on Lt side.  She was able to do a kegel after STM but had some increased tone after one kegel.  Pt was given one new stretch to work on opening the chest and thoracic extension.  Pt was monitored for pain throughout session and reports no increased pain.  she will benefit from skilled PT to continue to work on imporved soft tissue and strength of pelvic floor.    PT Treatment/Interventions  ADLs/Self Care Home Management;Biofeedback;Cryotherapy;Electrical Stimulation;Moist Heat;Therapeutic exercise;Therapeutic activities;Neuromuscular re-education;Patient/family education;Manual techniques;Passive range of motion;Scar mobilization;Dry needling;Taping    PT Next Visit Plan  f/u on stretch and soft tissue work from last session, begin pelvic floor strengthening exercises if tolerated and has  improved soft tissue    PT Home Exercise Plan  Access Code: [email or web address]    Recommended Other Services  cert signed    Consulted and Agree with Plan of Care  Patient       Patient will benefit from skilled therapeutic intervention in order to improve the following deficits and impairments:  Abnormal gait, Pain, Increased fascial restricitons, Postural dysfunction, Increased muscle spasms, Decreased scar mobility, Decreased coordination, Decreased range of motion, Decreased strength, Obesity, Impaired flexibility, Difficulty walking  Visit Diagnosis: Muscle weakness (generalized)  Abnormal posture  Cramp and spasm     Problem List Patient Active Problem List   Diagnosis Date Noted  . Insomnia 03/03/2019  . Ankle arthritis 12/12/2018  . Muscle weakness 12/12/2018  . Obstructive sleep apnea 10/23/2018  . Asthmatic bronchitis 10/23/2018  . Cellulitis of internal cheek, left 01/23/2018  . Lung nodule 09/15/2017  . Morbid obesity with BMI of 45.0-49.9, adult ([place]) 09/05/2017  . Arthritis of carpometacarpal (CMC) joints of both thumbs 03/14/2017  . 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 Vertigo 10/12/2016  . Peripheral positional vertigo 08/28/2016  . Abnormal auditory perception of both ears 07/27/2016  . Neuropathic pain 06/25/2016  . 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 S/P lumbar spinal fusion 01/27/2016  . H/O therapeutic radiation 09/21/2015  . Personal history of breast cancer 09/21/2015  .  Pes planus of both feet 08/04/2015  . Primary osteoarthritis of right knee 03/10/2015  . Osteoarthritis of right knee 02/04/2015  . Primary osteoarthritis of left knee 09/13/2014  . Hot flashes 01/19/2014  . Abdominal pain, unspecified site 01/19/2014  . Malignant neoplasm of upper-outer quadrant of left breast in female, estrogen receptor positive ([place]) 03/19/2013  . Lumbosacral spondylosis without myelopathy 12/30/2012  . Fibromyalgia syndrome  08/15/2012  . Peripheral neuropathy, toxic 08/15/2012  . Lymphedema of arm - left 04/30/2012    Jule Ser, PT 05/05/2019, 1:37 PM  [facility] [address] [place], [place], [address] Phone: [phone]   Fax:  [phone]  Name: Kelsey Wilson MRN: [id number] Date of Birth: 1961/03/06

## 2019-05-07 ENCOUNTER — Ambulatory Visit (INDEPENDENT_AMBULATORY_CARE_PROVIDER_SITE_OTHER): Payer: Medicare HMO | Admitting: Podiatry

## 2019-05-07 ENCOUNTER — Other Ambulatory Visit: Payer: Self-pay

## 2019-05-07 DIAGNOSIS — M76829 Posterior tibial tendinitis, unspecified leg: Secondary | ICD-10-CM | POA: Diagnosis not present

## 2019-05-07 DIAGNOSIS — M2142 Flat foot [pes planus] (acquired), left foot: Secondary | ICD-10-CM

## 2019-05-07 DIAGNOSIS — E1149 Type 2 diabetes mellitus with other diabetic neurological complication: Secondary | ICD-10-CM

## 2019-05-07 DIAGNOSIS — M2141 Flat foot [pes planus] (acquired), right foot: Secondary | ICD-10-CM

## 2019-05-08 ENCOUNTER — Telehealth: Payer: Self-pay | Admitting: *Deleted

## 2019-05-08 MED ORDER — HYDROCODONE-ACETAMINOPHEN 7.5-325 MG PO TABS: ORAL_TABLET | ORAL | 0 refills | Status: DC

## 2019-05-08 NOTE — Addendum Note (Signed)
Addended by: Charlett Blake on: 05/08/2019 02:22 PM   Modules accepted: Orders

## 2019-05-08 NOTE — Telephone Encounter (Signed)
Kelsey Wilson called to get a refill on her hydrocodone. There should be an Rx at [facility] with DNF before 05/05/19.  She said she called them before she called [place] and they do not have it. I have verified this with them as well.  I spoke with Zella Ball and there were some computer problems that may have interfered with the transmission.  PMP checked and last fill was 04/08/19 #100.  Dr Letta Pate will have to send the rx for hydrocodone 7.5/325  #100 and [name] asks that it go to [facility]. Her next appt is with Zella Ball on 06/02/19.

## 2019-05-08 NOTE — Progress Notes (Signed)
Subjective: 58 year old female presents the office today for evaluation of right ankle pain, tendinitis.  She does state she does get pain in the left side as well but not as significant as the right side.  She is still awaiting diabetic shoes.  She does state that physical therapy has been helpful.  She has 3 more sessions.  No recent injury or trauma since I last saw her early changes otherwise. Denies any systemic complaints such as fevers, chills, nausea, vomiting. No acute changes since last appointment, and no other complaints at this time.   Objective: AAO x3, NAD DP/PT pulses palpable bilaterally, CRT less than 3 seconds There is continued tenderness mostly in the right lower extremity.  There is minimal tenderness on the plantar aspect of the calcaneus insertion of plantar fascia.  The trigger tenderness today is on the medial aspect ankle in the course of the posterior tibial tendon.  She also gets pain on the course of the peroneal tendon.  Mild discomfort along the distal portion of the Achilles tendon.  Thompson test is negative.  On the left side there is mild tenderness in the course of the posterior tibial tendon.  No areas of tenderness identified today. No pain with calf compression, swelling, warmth, erythema  MRI 02/05/2019 IMPRESSION: 1. Torn anterior talofibular ligament with edema and synovitis which may predispose to anterolateral impingement. 2. Thickened anterior inferior tibiofibular ligament, possibly the result of a prior tear or sprain. 3. Markedly thickened and indistinct superomedial portion of the spring ligament with ill definition of the medioplantar oblique and inferoplantar longitudinal portions of the spring ligament, favoring tearing. 4. Mild distal tibialis posterior tendinopathy, correlate clinically in assessing for tibialis posterior dysfunction. 5. High signal in the deep tibiotalar portion of the deltoid ligament favoring sprain, with ill definition of  tibiospring and tibionavicular components likely from chronic tear. 6. 4 mm non-fragmented osteochondral lesion along the anteromedial talar dome. 7. Mild distal tibialis anterior tendinopathy and mild distal Achilles tendinopathy.   Assessment: Right chronic ankle pain, plantar fasciitis/tendonitis  Plan: -All treatment options discussed with the patient including all alternatives, risks, complications.  -I do think majority of her issues are coming from wearing very flat shoes.  We are hoping to get the diabetic shoes and inserts as soon as I do think is been helpful for her.  When she states she is wanting to go back to physical therapy  in order to work with the rehab.  She has been doing home exercises intermittently.  For her to continue those daily. -Recommend Biofreeze.  No follow-ups on file.  Trula Slade DPM

## 2019-05-12 ENCOUNTER — Encounter: Payer: Medicare HMO | Admitting: Physical Therapy

## 2019-05-19 ENCOUNTER — Encounter: Payer: Self-pay | Admitting: Physical Therapy

## 2019-05-19 ENCOUNTER — Other Ambulatory Visit: Payer: Self-pay

## 2019-05-19 ENCOUNTER — Telehealth: Payer: Self-pay | Admitting: Podiatry

## 2019-05-19 ENCOUNTER — Ambulatory Visit: Payer: Medicare HMO | Admitting: Physical Therapy

## 2019-05-19 DIAGNOSIS — M6281 Muscle weakness (generalized): Secondary | ICD-10-CM

## 2019-05-19 DIAGNOSIS — R293 Abnormal posture: Secondary | ICD-10-CM

## 2019-05-19 DIAGNOSIS — R252 Cramp and spasm: Secondary | ICD-10-CM

## 2019-05-19 NOTE — Therapy (Signed)
[facility] [address], [place], [place], [address] Phone: [phone]   Fax:  [phone]  Physical Therapy Treatment  Patient Details  Name: Kelsey Wilson MRN: [id number] Date of Birth: August 24, 1960 Referring Provider (PT): Magrinat, Virgie Dad, MD   Encounter Date: 05/19/2019  PT End of Session - 05/19/19 1105    Visit Number  4    Date for PT Re-Evaluation  06/29/19    PT Start Time  1102    PT Stop Time  1140    PT Time Calculation (min)  38 min       Past Medical History:  Diagnosis Date  . Anemia   . Anxiety   . Arthritis   . Breast cancer ([place]) 2010   T3N1 invasive ductal carcinoma left breast.Takes Arimidex daily  . Bursitis   . Carpal tunnel syndrome   . Chronic back pain    stenosis  . Constipation    takes Colace daily  . Depression    takes Benzotropine daily  . Diverticulitis of colon   . Dyspnea    daily when walking for over 1 yr.  . Fibromyalgia 08/2012  . GERD (gastroesophageal reflux disease)    takes Dexilant daily  . Hemorrhoid   . History of blood transfusion    no abnormal reaction noted  . History of colon polyps    benign  . History of shingles   . Joint pain   . Joint swelling   . Night muscle spasms    takes Flexeril nightly as needed  . Nocturia   . OSA (obstructive sleep apnea)   . OSA on CPAP   . Peripheral edema    takes Furosemide.Just started 01/18/16  . Peripheral neuropathy    takes Lyrica daily  . Personal history of chemotherapy    2013  . Personal history of radiation therapy    2013  . Pneumonia    hx of-2015  . Pseudoarthrosis of lumbar spine   . Seasonal allergies    takes Singulair nightly  . SOB (shortness of breath) on exertion    rarely with exertion  . 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 Alphonzo Grieve)    Past  Surgical History:  Procedure Laterality Date  . ABDOMINAL HYSTERECTOMY     still has ovaries  . APPENDECTOMY    . AXILLARY LYMPH NODE DISSECTION  11/28/2011   Procedure: AXILLARY LYMPH NODE DISSECTION;  Surgeon: Edward Jolly, MD;  Location: [place];  Service: General;  Laterality: Left;  . BREAST LUMPECTOMY Left 11/28/2011   Malignant  . BREAST SURGERY Left 2013  . CARPAL TUNNEL RELEASE     Bilateral  . CESAREAN SECTION     pt. has had 3  . CHOLECYSTECTOMY    . COLONOSCOPY    . EYE SURGERY Bilateral    cataract removal  . KNEE SURGERY     Left Knee  . LAMINECTOMY WITH POSTERIOR LATERAL ARTHRODESIS LEVEL 1 N/A 11/29/2017   Procedure: Posterior Lateral Fusion - Lumbar Four-Lumbar Five, removal and replacement of hardware, Laminectomy - Lumbar Four-Lumbar Five;  Surgeon: Eustace Moore, MD;  Location: [facility];  Service: Neurosurgery;  Laterality: N/A;  . LAMINECTOMY WITH POSTERIOR LATERAL ARTHRODESIS LEVEL 2 N/A 06/07/2017   Procedure: Posterior Lateral Fusion Lumbar Three-Four and Transforaminal Interbody Fusion Lumbar Four-Five with Segmental  Pedicle Screw Fixation;  Surgeon:  Eustace Moore, MD;  Location: [place];  Service: Neurosurgery;  Laterality: N/A;  Posterior Lateral Fusion Lumbar Three-Four and Transforaminal Interbody Fusion Lumbar Four-Five with Segmental  Pedicle Screw Fixation   . LUMBAR FUSION  06/07/2017   POST  . LUMBAR LAMINECTOMY/DECOMPRESSION MICRODISCECTOMY Bilateral 01/27/2016   Procedure: Laminectomy and Foraminotomy - Lumbar four -Lumbar five - bilateral- on-lay noninstrumented fusion;  Surgeon: Eustace Moore, MD;  Location: [place] [facility];  Service: Neurosurgery;  Laterality: Bilateral;  . MULTIPLE EXTRACTIONS WITH ALVEOLOPLASTY N/A 05/11/2016   Procedure: EXTRACTION OF TEETH EIGHTEEN, TWENTY AND TWENTY- NINE;  REMOVAL OF MANDIBULAR TORUS AND EXOSTOSIS;  Surgeon: Diona Browner, DDS;  Location: [place];  Service: Oral Surgery;  Laterality: N/A;  . PORTACATH PLACEMENT   06/18/2011   Procedure: INSERTION PORT-A-CATH;  Surgeon: Edward Jolly, MD;  Location: [place];  Service: General;  Laterality: Right;  right subclavian  . removal portacath  2014  . spur     Apex spur on both big toes  . TOE SURGERY Bilateral   . TONSILLECTOMY    . TOTAL KNEE ARTHROPLASTY Left 09/13/2014  . TOTAL KNEE ARTHROPLASTY Left 09/13/2014   Procedure: LEFT TOTAL KNEE ARTHROPLASTY;  Surgeon: Rod Can, MD;  Location: [place];  Service: Orthopedics;  Laterality: Left;  . TOTAL KNEE ARTHROPLASTY Right 03/10/2015   Procedure: RIGHT TOTAL KNEE ARTHROPLASTY;  Surgeon: Rod Can, MD;  [facility];  Service: Orthopedics;  Laterality: Right;    There were no vitals filed for this visit.  Subjective Assessment - 05/19/19 1116    Subjective  It feels better.  About 30-35%.  I feel pressure when I sit for too long.    How long can you sit comfortably?  20-25 min    Patient Stated Goals  be able to have a pelvic exam next year    Currently in Pain?  No/denies    Pain Score  8     Pain Location  Back    Pain Orientation  Right;Left;Lower    Pain Descriptors / Indicators  Aching    Pain Type  Chronic pain    Pain Onset  More than a month ago    Pain Frequency  Intermittent    Aggravating Factors   maybe weather    Multiple Pain Sites  No                       [facility] Adult PT Treatment/Exercise - 05/19/19 0001      Neuro Re-ed    Neuro Re-ed Details   tactile cues to contract and relax pelvic floor, breathing techniques with stretches      Lumbar Exercises: Seated   Other Seated Lumbar Exercises  lumbar flexion using pball - roll out and breathing into back      Lumbar Exercises: Supine   AB Set Limitations  kegel with tactile cues - needs 10 sec rest able to hold2 sec - 8 reps      Manual Therapy   Manual therapy comments  pt identity confirmed and informed consent given    Soft tissue mobilization  lumbar stretch and fascial  release with forward flexion    Myofascial Release  Lt >Rt bulbocavernosis and ischiocav, urethra               PT Short Term Goals - 05/19/19 1144      PT SHORT TERM GOAL #1   Title  ind with initial HEP  Status  Achieved      PT SHORT TERM GOAL #2   Title  pain decreased by at least 25%    Status  Achieved      PT SHORT TERM GOAL #3   Title  nocturia 2x at most    Status  On-going        PT Long Term Goals - 04/06/19 1523      PT LONG TERM GOAL #1   Title  Pt will be ind with HEP    Time  12    Period  Weeks    Status  New    Target Date  06/29/19      PT LONG TERM GOAL #2   Title  Pt will report at least 50% less groin and pelvic pain    Time  12    Period  Weeks    Status  New    Target Date  06/29/19      PT LONG TERM GOAL #3   Title  Pt will be able to tolerate complete pelvic exam due to improved soft tissue pliability    Time  12    Period  Weeks    Status  New    Target Date  06/29/19      PT LONG TERM GOAL #4   Title  Pt will report nocturia of 1x at most due to improved pelvic floor strength    Time  12    Period  Weeks    Status  New    Target Date  06/29/19            Plan - 05/19/19 1145    Clinical Impression Statement  Pt met 2 of the short term goals. She was able to progress strengthening exercises today and muscle tissue feels less tense.  Pt will continue to benefit from skilled PT to improve muscle endurance and coordination for reduction of pressure and improved functional activities.    PT Treatment/Interventions  ADLs/Self Care Home Management;Biofeedback;Cryotherapy;Electrical Stimulation;Moist Heat;Therapeutic exercise;Therapeutic activities;Neuromuscular re-education;Patient/family education;Manual techniques;Passive range of motion;Scar mobilization;Dry needling;Taping    PT Next Visit Plan  f/u on stretch and soft tissue work from last session, begin pelvic floor strengthening exercises if tolerated and has improved  soft tissue    PT Home Exercise Plan  Access Code: [email or web address]    Consulted and Agree with Plan of Care  Patient       Patient will benefit from skilled therapeutic intervention in order to improve the following deficits and impairments:  Abnormal gait, Pain, Increased fascial restricitons, Postural dysfunction, Increased muscle spasms, Decreased scar mobility, Decreased coordination, Decreased range of motion, Decreased strength, Obesity, Impaired flexibility, Difficulty walking  Visit Diagnosis: Muscle weakness (generalized)  Abnormal posture  Cramp and spasm     Problem List Patient Active Problem List   Diagnosis Date Noted  . Insomnia 03/03/2019  . Ankle arthritis 12/12/2018  . Muscle weakness 12/12/2018  . Obstructive sleep apnea 10/23/2018  . Asthmatic bronchitis 10/23/2018  . Cellulitis of internal cheek, left 01/23/2018  . Lung nodule 09/15/2017  . Morbid obesity with BMI of 45.0-49.9, adult ([place]) 09/05/2017  . Arthritis of carpometacarpal (CMC) joints of both thumbs 03/14/2017  . 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 Vertigo 10/12/2016  . Peripheral positional vertigo 08/28/2016  . Abnormal auditory perception of both ears 07/27/2016  . Neuropathic pain 06/25/2016  . 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 S/P lumbar spinal fusion 01/27/2016  . H/O  therapeutic radiation 09/21/2015  . Personal history of breast cancer 09/21/2015  . Pes planus of both feet 08/04/2015  . Primary osteoarthritis of right knee 03/10/2015  . Osteoarthritis of right knee 02/04/2015  . Primary osteoarthritis of left knee 09/13/2014  . Hot flashes 01/19/2014  . Abdominal pain, unspecified site 01/19/2014  . Malignant neoplasm of upper-outer quadrant of left breast in female, estrogen receptor positive ([place]) 03/19/2013  . Lumbosacral spondylosis without myelopathy 12/30/2012  . Fibromyalgia syndrome 08/15/2012  . Peripheral neuropathy, toxic 08/15/2012   . Lymphedema of arm - left 04/30/2012    Kelsey Wilson, PT 05/19/2019, 11:48 AM  [facility] [address], [place], [place], [address] Phone: [phone]   Fax:  [phone]  Name: Kelsey Wilson MRN: [id number] Date of Birth: Dec 19, 1960

## 2019-05-19 NOTE — Telephone Encounter (Signed)
Pt left message checking status of diabetic shoe paperwork.   I returned call and left message stating we still have not received the paperwork from the doctor. It has been faxed multiple times electronically and manually to both the numbers pt gave me and we still have not received the paperwork back signed.

## 2019-05-26 ENCOUNTER — Encounter: Payer: Medicare HMO | Admitting: Physical Therapy

## 2019-05-27 ENCOUNTER — Encounter: Payer: Self-pay | Admitting: Internal Medicine

## 2019-06-01 ENCOUNTER — Ambulatory Visit (INDEPENDENT_AMBULATORY_CARE_PROVIDER_SITE_OTHER): Payer: Medicare HMO | Admitting: Internal Medicine

## 2019-06-01 ENCOUNTER — Encounter: Payer: Self-pay | Admitting: Internal Medicine

## 2019-06-01 ENCOUNTER — Other Ambulatory Visit: Payer: Self-pay

## 2019-06-01 DIAGNOSIS — J4530 Mild persistent asthma, uncomplicated: Secondary | ICD-10-CM | POA: Diagnosis not present

## 2019-06-01 DIAGNOSIS — R911 Solitary pulmonary nodule: Secondary | ICD-10-CM | POA: Diagnosis not present

## 2019-06-01 DIAGNOSIS — G4733 Obstructive sleep apnea (adult) (pediatric): Secondary | ICD-10-CM | POA: Diagnosis not present

## 2019-06-01 DIAGNOSIS — Q991 46, XX true hermaphrodite: Secondary | ICD-10-CM

## 2019-06-01 DIAGNOSIS — K219 Gastro-esophageal reflux disease without esophagitis: Secondary | ICD-10-CM

## 2019-06-01 MED ORDER — BENZONATATE 100 MG PO CAPS: 200.0000 mg | ORAL_CAPSULE | Freq: Three times a day (TID) | ORAL | 12 refills | Status: DC | PRN

## 2019-06-01 NOTE — Patient Instructions (Signed)
Script sent refilling tessalon perles for cough if needed  Order DME [facility]- continue CPAP auto 5-15, mask of choice, humidifier, supplies, AirView/ card  Suggest you try otc Pepcid acid blocker to help with heart burn and reflux. Once or twice daily, before a meal.  Try elevating the head of your bed frame by putting one brick under each of the head legs. This will also reduce the tendency for stomach juice to slide up hill towards your throat when you lie down.  I notice you are on lisinopril. This is a good medicine, but it commonly causes a persistent dry cough.  If treating the reflux isn't enough to stop the cough, ask your doctor about changing lisinopril.  Order- schedule future CT chest, no contrast,  In September, 2021    Dx multiple lung nodules, history of breast cancer.  Please call if we can help

## 2019-06-01 NOTE — Progress Notes (Signed)
HPI F never smoker followed for OSA,, Asthma, allergic rhinitis,  lung nodules, complicated by DM2, hx Breast cancer L in remission, HBP HST 01/30/2019- AHI 23.7/ hr, desaturation to 71%, body weight 293 lbs NPSG 07/23/2016- AAHI 116.6/ hr, desaturation to 72%   ------------------------------------------------------------------------------------  10/23/2018- 57 yoF never smoker for pulmonary evaluation with hx of wheezing dyspnea and obstructive sleep apnea. Lives w daughter. -----referred by Dr. Alphonzo Grieve (PCP) for sleep apnea; split night study 07/23/2016; formely on CPAP, however, states her insurance withdrew it d/t lack of usage; pt is not currently on CPAP, states she is restless and "up and down" Medical problem list includes DM2, hx Breast cancer L in remission, HBP She had previously declined use of an inhaler because she didn't like the taste.  NPSG 07/23/2016- AAHI 116.6/ hr, desaturation to 72% CT chest ordered 1o/10/19 for lung nodules w concern mets after breast cancer. Never done. CT chest 12/16/17- Dr Jana Hakim IMPRESSION: 1. Previously noted nodules in the left lung are stable compared to the prior study. This is reassuring, however, close attention on follow-up studies is recommended to ensure continued stability. Repeat noncontrast chest CT is recommended in 6-9 months to ensure continued stability. 2. Mild cardiomegaly. Body weight today 293 lbs, room air O2 sat 97% at rest Feels squirmy without pain or awareness of dyspnea lying in bed. Daytime sleepiness, dozes. Snores. Naps many days.  ENT+ tonsils.Not aware of lung disease but hears some wheeze with exertion. Tried an inhaler but didn't like the taste. Frequent dry cough- not aware at night.  Seasonal allergic rhinitis - not  Bad. Easy DOE w ADLs, but not recent acute exacerbation. One remote pneumonia.  Had L lumpectomy and XRT. Aware of lung nodule, for f/u CT per Dr Jana Hakim.   03/03/2019- 70 yoF never smoker followed  for OSA,, Asthma, lung nodules, complicated by DM2, hx Breast cancer L in remission, HBP HST 01/30/2019- AHI 23.7/ hr, desaturation to 71%, body weight 293 lbs Body weight today 294 lbs -----HST 11/07/2018, pt reports usual DOE, needs prescription for Anoro Anoro, Singulair,  Restless sleeper, ag rees a sleep med would help while she adjusts to CPAP. Has sometimes awakened coughing- not cleary described. I educated her to watch for reflux.  She declines flu vax. Anoro sample helped breathing- asks for Rx- discussed.  CT chest 02/13/2019- followed by Oncology IMPRESSION: 1. No change in an 8 mm pulmonary nodule of the lingula (series 7, image 67) as well as a 3 mm nodule of the dependent left lower lobe (series 5, image 70). Given the significant interval stability dating back to 09/13/2017, these are likely benign sequelae of infection or inflammation. Attention on follow-up as indicated by clinical oncologic protocol. 2.  Cardiomegaly 3.  Hepatic steatosis.  06/01/2019-  Virtual Visit via Telephone Note  I connected with [name] on 06/01/19 at 10:30 AM EST by telephone and verified that I am speaking with the correct person using two identifiers.  Location: Patient: H Provider: O   I discussed the limitations, risks, security and privacy concerns of performing an evaluation and management service by telephone and the availability of in person appointments. I also discussed with the patient that there may be a patient responsible charge related to this service. The patient expressed understanding and agreed to proceed.   History of Present Illness: 83 yoF never smoker followed for OSA,, Asthma, lung nodules, complicated by DM2, hx Breast cancer L in remission, HBP Anoro, Singulair,  CPAP 5-15/ [facility] Asp  compliance 70%, AHI 3.6/ hr If up to bathroom by 4:30, she won't put CPAP back on for the rest of her time in bed. Coughing- feels irritation in throat. Dry.  Tessalon helps. + GERD , no post nasal drip. Had glaucoma surgery so we will defer LAMA for now.  Lung nodule, stable on CT, was followed by Oncology.   Observations/Objective:   Assessment and Plan: OSA- meeting goals. Continue 5-15 GERD- Use Pepcid daily, raise HOB,  Lung Nodule- f/u CT 9/21 Follow Up Instructions: 1 year   I discussed the assessment and treatment plan with the patient. The patient was provided an opportunity to ask questions and all were answered. The patient agreed with the plan and demonstrated an understanding of the instructions.   The patient was advised to call back or seek an in-person evaluation if the symptoms worsen or if the condition fails to improve as anticipated.  I provided 21 minutes of non-face-to-face time during this encounter.   Baird Lyons, MD     ROS-see HPI   + = positive Constitutional:    weight loss, night sweats, fevers, chills, fatigue, lassitude. HEENT:    headaches, +difficulty swallowing, +tooth/dental problems, sore throat,       +sneezing,+ itching,+ ear ache, nasal congestion, post nasal drip, snoring CV:    chest pain, orthopnea, PND, +swelling in lower extremities, anasarca,                                  dizziness, palpitations Resp:   +shortness of breath with exertion or at rest.                productive cough,   non-productive cough, coughing up of blood.              change in color of mucus.  wheezing.   Skin:    rash or lesions. GI:  No-   heartburn, indigestion, +abdominal pain, nausea, vomiting, diarrhea,                 change in bowel habits, loss of appetite GU: dysuria, change in color of urine, no urgency or frequency.   flank pain. MS:   +joint pain, stiffness, decreased range of motion, back pain. Neuro-     nothing unusual Psych:  change in mood or affect.  depression or +anxiety.   memory loss.  OBJ- Physical Exam General- Alert, Oriented, Affect-appropriate, Distress- none acute, + morbidly  obese Skin- rash-none, lesions- none, excoriation- none Lymphadenopathy- none Head- atraumatic            Eyes- Gross vision intact, PERRLA, conjunctivae and secretions clear            Ears- Hearing, canals-normal            Nose- Clear, no-Septal dev, mucus, polyps, erosion, perforation             Throat- Mallampati III , mucosa clear , drainage- none, tonsils- atrophic, + teeth Neck- flexible , trachea midline, no stridor , thyroid nl, carotid no bruit Chest - symmetrical excursion , unlabored           Heart/CV- RRR , no murmur , no gallop  , no rub, nl s1 s2                           - JVD- none , edema- none, stasis changes-  none, varices- none           Lung- clear to P&A, wheeze- none, cough- none , dullness-none, rub- none           Chest wall- +Scar from port access Abd-  Br/ Gen/ Rectal- Not done, not indicated Extrem- cyanosis- none, clubbing, none, atrophy- none, strength- nl Neuro- grossly intact to observation

## 2019-06-02 ENCOUNTER — Encounter: Payer: Medicare HMO | Attending: Physical Medicine & Rehabilitation | Admitting: Registered Nurse

## 2019-06-02 ENCOUNTER — Other Ambulatory Visit: Payer: Self-pay

## 2019-06-02 ENCOUNTER — Ambulatory Visit: Payer: Medicare HMO | Admitting: Physical Therapy

## 2019-06-02 ENCOUNTER — Encounter: Payer: Self-pay | Admitting: Physical Therapy

## 2019-06-02 ENCOUNTER — Encounter: Payer: Self-pay | Admitting: Registered Nurse

## 2019-06-02 VITALS — Ht 63.0 in | Wt 295.0 lb

## 2019-06-02 DIAGNOSIS — M6281 Muscle weakness (generalized): Secondary | ICD-10-CM

## 2019-06-02 DIAGNOSIS — M533 Sacrococcygeal disorders, not elsewhere classified: Secondary | ICD-10-CM | POA: Diagnosis not present

## 2019-06-02 DIAGNOSIS — M961 Postlaminectomy syndrome, not elsewhere classified: Secondary | ICD-10-CM

## 2019-06-02 DIAGNOSIS — G8918 Other acute postprocedural pain: Secondary | ICD-10-CM | POA: Insufficient documentation

## 2019-06-02 DIAGNOSIS — G4733 Obstructive sleep apnea (adult) (pediatric): Secondary | ICD-10-CM | POA: Insufficient documentation

## 2019-06-02 DIAGNOSIS — M47817 Spondylosis without myelopathy or radiculopathy, lumbosacral region: Secondary | ICD-10-CM | POA: Diagnosis not present

## 2019-06-02 DIAGNOSIS — M797 Fibromyalgia: Secondary | ICD-10-CM | POA: Insufficient documentation

## 2019-06-02 DIAGNOSIS — M545 Low back pain: Secondary | ICD-10-CM | POA: Insufficient documentation

## 2019-06-02 DIAGNOSIS — G894 Chronic pain syndrome: Secondary | ICD-10-CM | POA: Insufficient documentation

## 2019-06-02 DIAGNOSIS — Z5181 Encounter for therapeutic drug level monitoring: Secondary | ICD-10-CM | POA: Insufficient documentation

## 2019-06-02 DIAGNOSIS — Z853 Personal history of malignant neoplasm of breast: Secondary | ICD-10-CM | POA: Insufficient documentation

## 2019-06-02 DIAGNOSIS — R293 Abnormal posture: Secondary | ICD-10-CM

## 2019-06-02 DIAGNOSIS — M48061 Spinal stenosis, lumbar region without neurogenic claudication: Secondary | ICD-10-CM | POA: Insufficient documentation

## 2019-06-02 DIAGNOSIS — M62838 Other muscle spasm: Secondary | ICD-10-CM

## 2019-06-02 DIAGNOSIS — Z79891 Long term (current) use of opiate analgesic: Secondary | ICD-10-CM | POA: Insufficient documentation

## 2019-06-02 DIAGNOSIS — Z981 Arthrodesis status: Secondary | ICD-10-CM

## 2019-06-02 DIAGNOSIS — F329 Major depressive disorder, single episode, unspecified: Secondary | ICD-10-CM | POA: Insufficient documentation

## 2019-06-02 DIAGNOSIS — G8929 Other chronic pain: Secondary | ICD-10-CM | POA: Insufficient documentation

## 2019-06-02 DIAGNOSIS — Z9889 Other specified postprocedural states: Secondary | ICD-10-CM | POA: Insufficient documentation

## 2019-06-02 DIAGNOSIS — K219 Gastro-esophageal reflux disease without esophagitis: Secondary | ICD-10-CM | POA: Insufficient documentation

## 2019-06-02 DIAGNOSIS — R252 Cramp and spasm: Secondary | ICD-10-CM

## 2019-06-02 MED ORDER — HYDROCODONE-ACETAMINOPHEN 7.5-325 MG PO TABS: ORAL_TABLET | ORAL | 0 refills | Status: DC

## 2019-06-02 NOTE — Progress Notes (Signed)
Subjective:    Patient ID: Kelsey Wilson, female    DOB: 1960-12-27, 58 y.o.   MRN: [id number]  HPI: Kelsey Wilson is a 58 y.o. female whose appointment was changed to a virtual office visit to reduce the risk of exposure to the COVID-19 virus and to help Kelsey Wilson remain healthy and safe. The virtual visit will also provide continuity of care. Kelsey Wilson agrees to virtual visit and verbalizes understanding. She states her pain is located in her lower back radiating  into her buttocks, also reports her pain has increased in intensity requesting a bilateral sacroiliac injection. S/P Bilateral Sacroilliac injection on 10/21/2018 with good relief noted. We will scheduled her for injection with Dr. Letta Pate, she verbalizes understanding.  She rates her pain 6. Her current exercise regime is walking and performing stretching exercises.  Ms. Morado Morphine equivalent is 30.00  MME.  Last UDS was Performed on 04/23/2019, it was consistent.   Pain Inventory Average Pain 8 Pain Right Now 6 My pain is constant and aching  In the last 24 hours, has pain interfered with the following? General activity 5 Relation with others 5 Enjoyment of life 5 What TIME of day is your pain at its worst? daytime Sleep (in general) Fair  Pain is worse with: walking, bending, standing and some activites Pain improves with: rest, heat/ice, therapy/exercise, pacing activities, medication and injections Relief from Meds: 3  Mobility walk without assistance walk with assistance use a cane ability to climb steps?  yes do you drive?  yes  Function disabled: date disabled .  Neuro/Psych weakness numbness tremor tingling trouble walking spasms confusion  Prior Studies Any changes since last visit?  no  Physicians involved in your care Any changes since last visit?  no   Family History  Problem Relation Age of Onset  . Hypertension Mother   . Diabetes Mother   . Hypertension Father     . Diabetes Father   . Cancer Paternal Grandmother        unknown  . Colon cancer Neg Hx    Social History   Socioeconomic History  . Marital status: Single    Spouse name: Not on file  . Number of children: 3  . Years of education: Not on file  . Highest education level: Not on file  Occupational History  . Not on file  Tobacco Use  . Smoking status: Never Smoker  . Smokeless tobacco: Never Used  Substance and Sexual Activity  . Alcohol use: No    Alcohol/week: 0.0 standard drinks  . Drug use: No  . Sexual activity: Never    Birth control/protection: Post-menopausal, Surgical  Other Topics Concern  . Not on file  Social History Narrative  . Not on file   Social Determinants of Health   Financial Resource Strain:   . Difficulty of Paying Living Expenses: Not on file  Food Insecurity:   . Worried About Charity fundraiser in the Last Year: Not on file  . Ran Out of Food in the Last Year: Not on file  Transportation Needs:   . Lack of Transportation (Medical): Not on file  . Lack of Transportation (Non-Medical): Not on file  Physical Activity:   . Days of Exercise per Week: Not on file  . Minutes of Exercise per Session: Not on file  Stress:   . Feeling of Stress : Not on file  Social Connections:   . Frequency of Communication with Friends and Family:  Not on file  . Frequency of Social Gatherings with Friends and Family: Not on file  . Attends Religious Services: Not on file  . Active Member of Clubs or Organizations: Not on file  . Attends Archivist Meetings: Not on file  . Marital Status: Not on file   Past Surgical History:  Procedure Laterality Date  . ABDOMINAL HYSTERECTOMY     still has ovaries  . APPENDECTOMY    . AXILLARY LYMPH NODE DISSECTION  11/28/2011   Procedure: AXILLARY LYMPH NODE DISSECTION;  Surgeon: Edward Jolly, MD;  Location: [facility];  Service: General;  Laterality: Left;  . BREAST LUMPECTOMY Left 11/28/2011   Malignant   . BREAST SURGERY Left 2013  . CARPAL TUNNEL RELEASE     Bilateral  . CESAREAN SECTION     pt. has had 3  . CHOLECYSTECTOMY    . COLONOSCOPY    . EYE SURGERY Bilateral    cataract removal  . KNEE SURGERY     Left Knee  . LAMINECTOMY WITH POSTERIOR LATERAL ARTHRODESIS LEVEL 1 N/A 11/29/2017   Procedure: Posterior Lateral Fusion - Lumbar Four-Lumbar Five, removal and replacement of hardware, Laminectomy - Lumbar Four-Lumbar Five;  Surgeon: Eustace Moore, MD;  Location: [facility] OR;  Service: Neurosurgery;  Laterality: N/A;  . LAMINECTOMY WITH POSTERIOR LATERAL ARTHRODESIS LEVEL 2 N/A 06/07/2017   Procedure: Posterior Lateral Fusion Lumbar Three-Four and Transforaminal Interbody Fusion Lumbar Four-Five with Segmental  Pedicle Screw Fixation;  Surgeon: Eustace Moore, MD;  Location: [place];  Service: Neurosurgery;  Laterality: N/A;  Posterior Lateral Fusion Lumbar Three-Four and Transforaminal Interbody Fusion Lumbar Four-Five with Segmental  Pedicle Screw Fixation   . LUMBAR FUSION  06/07/2017   POST  . LUMBAR LAMINECTOMY/DECOMPRESSION MICRODISCECTOMY Bilateral 01/27/2016   Procedure: Laminectomy and Foraminotomy - Lumbar four -Lumbar five - bilateral- on-lay noninstrumented fusion;  Surgeon: Eustace Moore, MD;  Location: [place] [facility];  Service: Neurosurgery;  Laterality: Bilateral;  . MULTIPLE EXTRACTIONS WITH ALVEOLOPLASTY N/A 05/11/2016   Procedure: EXTRACTION OF TEETH EIGHTEEN, TWENTY AND TWENTY- NINE;  REMOVAL OF MANDIBULAR TORUS AND EXOSTOSIS;  Surgeon: Diona Browner, DDS;  Location: [place];  Service: Oral Surgery;  Laterality: N/A;  . PORTACATH PLACEMENT  06/18/2011   Procedure: INSERTION PORT-A-CATH;  Surgeon: Edward Jolly, MD;  Location: [place];  Service: General;  Laterality: Right;  right subclavian  . removal portacath  2014  . spur     Apex spur on both big toes  . TOE SURGERY Bilateral   . TONSILLECTOMY    . TOTAL KNEE ARTHROPLASTY Left 09/13/2014  . TOTAL KNEE  ARTHROPLASTY Left 09/13/2014   Procedure: LEFT TOTAL KNEE ARTHROPLASTY;  Surgeon: Rod Can, MD;  Location: [place];  Service: Orthopedics;  Laterality: Left;  . TOTAL KNEE ARTHROPLASTY Right 03/10/2015   Procedure: RIGHT TOTAL KNEE ARTHROPLASTY;  Surgeon: Rod Can, MD;  Location: [facility];  Service: Orthopedics;  Laterality: Right;   Past Medical History:  Diagnosis Date  . Anemia   . Anxiety   . Arthritis   . Breast cancer ([place]) 2010   T3N1 invasive ductal carcinoma left breast.Takes Arimidex daily  . Bursitis   . Carpal tunnel syndrome   . Chronic back pain    stenosis  . Constipation    takes Colace daily  . Depression    takes Benzotropine daily  . Diverticulitis of colon   . Dyspnea    daily when walking for over 1 yr.  Marland Kitchen  Fibromyalgia 08/2012  . GERD (gastroesophageal reflux disease)    takes Dexilant daily  . Hemorrhoid   . History of blood transfusion    no abnormal reaction noted  . History of colon polyps    benign  . History of shingles   . Joint pain   . Joint swelling   . Night muscle spasms    takes Flexeril nightly as needed  . Nocturia   . OSA (obstructive sleep apnea)   . OSA on CPAP   . Peripheral edema    takes Furosemide.Just started 01/18/16  . Peripheral neuropathy    takes Lyrica daily  . Personal history of chemotherapy    2013  . Personal history of radiation therapy    2013  . Pneumonia    hx of-2015  . Pseudoarthrosis of lumbar spine   . Seasonal allergies    takes Singulair nightly  . SOB (shortness of breath) on exertion    rarely with exertion  . 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 Alphonzo Grieve)   Ht 5\' 3"  (1.6 m)   Wt 295 lb (133.8 kg)   BMI 52.26 kg/m   Opioid Risk Score:   Fall Risk Score:  `1  Depression screen PHQ 2/9  Depression screen [facility] 2/9 08/04/2018 06/06/2018 04/04/2018 01/13/2018 12/13/2017  06/21/2017 03/07/2017  Decreased Interest 0 0 0 3 3 1 2   Down, Depressed, Hopeless 0 0 0 3 3 1 2   PHQ - 2 Score 0 0 0 6 6 2 4   Altered sleeping - - - - - - 0  Tired, decreased energy - - - - - - 3  Change in appetite - - - - - - 3  Feeling bad or failure about yourself  - - - - - - 2  Trouble concentrating - - - - - - 3  Moving slowly or fidgety/restless - - - - - - 3  Suicidal thoughts - - - - - - 0  PHQ-9 Score - - - - - - 18  Difficult doing work/chores - - - - - - Extremely dIfficult  Some recent data might be hidden     Review of Systems  Constitutional: Negative.   HENT: Negative.   Eyes: Negative.   Respiratory: Positive for cough.   Cardiovascular: Negative.   Gastrointestinal: Negative.   Endocrine: Negative.   Genitourinary: Negative.   Musculoskeletal: Positive for arthralgias, back pain, gait problem and myalgias.  Skin: Negative.   Allergic/Immunologic: Negative.   Neurological: Positive for tremors, weakness and numbness.  Hematological: Negative.   Psychiatric/Behavioral: Negative.        Objective:   Physical Exam Vitals and nursing note reviewed.  Musculoskeletal:     Comments: No Physical Exam Performed: Virtual Visit           Assessment & Plan:  1. Chronic Sacroiliac Joint Pain: Scheduled for Bilateral Sacroilliac Injection with Dr. Letta Pate.  2.. Lumbar Postlaminectomy/ S/P Lumbar Fusion:Spinal Stenosis: S/PPosterior Lateral Fusion L-4-L-5 removal and replacement of hardware, Laminectomy L4-L5 by Dr. Ronnald Ramp on12/19/2019.  Continue HEP as Tolerated and Continue current medication regime. Continue withSlow Weaning:Hydrocodone7.5/325 mg one tablet every 6 hours as needed#110.2. Lumbar Radiculitis: Continue current medication regime with Lyrica.06/02/2019 3. Fibromyalgia:ContinueHEP as Tolerated.Continue Current Medication Regimen with Lyrica.06/02/2019 4.RightGreater Trochanter Bursitis:No complaints today.Continue to Alternate  Ice and Heat Therapy.06/02/2019. 5. Muscle Spasm:Continue:Flexeril..Continue to Monitor.04/23/2019. 6. Chronic Pain Syndrome: Continue  Current medication regimen and HEP as Tolerated. Continue to Monitor.06/02/2019. 7. Bilateral OA of Bilateral Knees: Orthopedist following.Continue to Monitor.06/02/2019. 8. Bilateral Hand Pain:No complaints today.Continue to Monitor/ ? OA: Continue to Monitor.06/02/2019 9. Cervicalgia:No complaints Today.Continue to alternate Heat/Ice Therapy. Continue HEP as Tolerated.06/02/2019. 10. Chronic Bilateral Shoulders:Nocomplaints today.Continue to alternate Ice/ Heat therapy and HEP as Tolerated.06/02/2019. 11. Bilateral Ankle Pain: Podiatry Following:Continue to Monitor.06/02/2019  F/U in 1 month  Telephone Call Established Patient Location of Patient in her Home Location of Provider: Office Time Spent: 15 Minutes

## 2019-06-02 NOTE — Therapy (Addendum)
[facility] [address] [place], [place], [address] Phone: [phone]   Fax:  [phone]  Physical Therapy Treatment  Patient Details  Name: Mika Griffitts MRN: [id number] Date of Birth: 1961/05/21 Referring Provider (PT): Magrinat, Virgie Dad, MD   Encounter Date: 06/02/2019  PT End of Session - 06/02/19 1111    Visit Number  5    Date for PT Re-Evaluation  06/29/19    PT Start Time  1106    PT Stop Time  1144    PT Time Calculation (min)  38 min    Activity Tolerance  Patient tolerated treatment well    Behavior During Therapy  [facility] for tasks assessed/performed       Past Medical History:  Diagnosis Date  . Anemia   . Anxiety   . Arthritis   . Breast cancer ([place]) 2010   T3N1 invasive ductal carcinoma left breast.Takes Arimidex daily  . Bursitis   . Carpal tunnel syndrome   . Chronic back pain    stenosis  . Constipation    takes Colace daily  . Depression    takes Benzotropine daily  . Diverticulitis of colon   . Dyspnea    daily when walking for over 1 yr.  . Fibromyalgia 08/2012  . GERD (gastroesophageal reflux disease)    takes Dexilant daily  . Hemorrhoid   . History of blood transfusion    no abnormal reaction noted  . History of colon polyps    benign  . History of shingles   . Joint pain   . Joint swelling   . Night muscle spasms    takes Flexeril nightly as needed  . Nocturia   . OSA (obstructive sleep apnea)   . OSA on CPAP   . Peripheral edema    takes Furosemide.Just started 01/18/16  . Peripheral neuropathy    takes Lyrica daily  . Personal history of chemotherapy    2013  . Personal history of radiation therapy    2013  . Pneumonia    hx of-2015  . Pseudoarthrosis of lumbar spine   . Seasonal allergies    takes Singulair nightly  . SOB (shortness of breath) on exertion    rarely with exertion  . 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 Alphonzo Grieve)    Past Surgical History:  Procedure Laterality Date  . ABDOMINAL HYSTERECTOMY     still has ovaries  . APPENDECTOMY    . AXILLARY LYMPH NODE DISSECTION  11/28/2011   Procedure: AXILLARY LYMPH NODE DISSECTION;  Surgeon: Edward Jolly, MD;  Location: [place];  Service: General;  Laterality: Left;  . BREAST LUMPECTOMY Left 11/28/2011   Malignant  . BREAST SURGERY Left 2013  . CARPAL TUNNEL RELEASE     Bilateral  . CESAREAN SECTION     pt. has had 3  . CHOLECYSTECTOMY    . COLONOSCOPY    . EYE SURGERY Bilateral    cataract removal  . KNEE SURGERY     Left Knee  . LAMINECTOMY WITH POSTERIOR LATERAL ARTHRODESIS LEVEL 1 N/A 11/29/2017   Procedure: Posterior Lateral Fusion - Lumbar Four-Lumbar Five, removal and replacement of hardware, Laminectomy - Lumbar Four-Lumbar Five;  Surgeon: Eustace Moore, MD;  Location: [facility];  Service: Neurosurgery;  Laterality: N/A;  . LAMINECTOMY WITH POSTERIOR LATERAL ARTHRODESIS LEVEL 2 N/A 06/07/2017  Procedure: Posterior Lateral Fusion Lumbar Three-Four and Transforaminal Interbody Fusion Lumbar Four-Five with Segmental  Pedicle Screw Fixation;  Surgeon: Eustace Moore, MD;  Location: [place];  Service: Neurosurgery;  Laterality: N/A;  Posterior Lateral Fusion Lumbar Three-Four and Transforaminal Interbody Fusion Lumbar Four-Five with Segmental  Pedicle Screw Fixation   . LUMBAR FUSION  06/07/2017   POST  . LUMBAR LAMINECTOMY/DECOMPRESSION MICRODISCECTOMY Bilateral 01/27/2016   Procedure: Laminectomy and Foraminotomy - Lumbar four -Lumbar five - bilateral- on-lay noninstrumented fusion;  Surgeon: Eustace Moore, MD;  Location: [place] [facility];  Service: Neurosurgery;  Laterality: Bilateral;  . MULTIPLE EXTRACTIONS WITH ALVEOLOPLASTY N/A 05/11/2016   Procedure: EXTRACTION OF TEETH EIGHTEEN, TWENTY AND TWENTY- NINE;  REMOVAL OF MANDIBULAR TORUS AND EXOSTOSIS;   Surgeon: Diona Browner, DDS;  Location: [place];  Service: Oral Surgery;  Laterality: N/A;  . PORTACATH PLACEMENT  06/18/2011   Procedure: INSERTION PORT-A-CATH;  Surgeon: Edward Jolly, MD;  Location: [place];  Service: General;  Laterality: Right;  right subclavian  . removal portacath  2014  . spur     Apex spur on both big toes  . TOE SURGERY Bilateral   . TONSILLECTOMY    . TOTAL KNEE ARTHROPLASTY Left 09/13/2014  . TOTAL KNEE ARTHROPLASTY Left 09/13/2014   Procedure: LEFT TOTAL KNEE ARTHROPLASTY;  Surgeon: Rod Can, MD;  Location: [place];  Service: Orthopedics;  Laterality: Left;  . TOTAL KNEE ARTHROPLASTY Right 03/10/2015   Procedure: RIGHT TOTAL KNEE ARTHROPLASTY;  Surgeon: Rod Can, MD;  Location: [facility];  Service: Orthopedics;  Laterality: Right;    There were no vitals filed for this visit.  Subjective Assessment - 06/02/19 1110    Subjective  I have been feeling more sore and have had more to do than I want.  I have been trying to get my mothers bed ready and taking care of the kids                       [facility] Adult PT Treatment/Exercise - 06/02/19 0001      Manual Therapy   Manual therapy comments  pt identity confirmed and informed consent given    Myofascial Release  Lt >Rt bulbocavernosis and ischiocav, urethra    Internal Pelvic Floor  Lt >Rt bulbocavernosis and ischiocav, urethra               PT Short Term Goals - 06/02/19 1112      PT SHORT TERM GOAL #3   Title  nocturia 2x at most    Baseline  1-2x    Status  Achieved        PT Long Term Goals - 06/02/19 1114      PT LONG TERM GOAL #1   Title  Pt will be ind with HEP    Status  On-going      PT LONG TERM GOAL #2   Title  Pt will report at least 50% less groin and pelvic pain    Baseline  mostly on the Rt side and sometimes worse depending on what I am sitting on      PT LONG TERM GOAL #3   Title  Pt will be able to tolerate complete pelvic  exam due to improved soft tissue pliability      PT LONG TERM GOAL #4   Title  Pt will report nocturia of 1x at most due to improved pelvic floor strength    Baseline  improved  to 1-2x/ night    Status  On-going            Plan - 06/02/19 1150    Clinical Impression Statement  Today's session focused on soft tissue release due to continued pain and spasm in pelvic floor and patient is unable to do self massage at home.  Pt has met short term goal for reduced nocturia and and is almost to meeting long term goal of no more than 1x at night  She continues to need skilled PT and will focus on soft tissue release so she can have a pelvic exam which she is supposed to schedule but has avoided due to pain.    PT Treatment/Interventions  ADLs/Self Care Home Management;Biofeedback;Cryotherapy;Electrical Stimulation;Moist Heat;Therapeutic exercise;Therapeutic activities;Neuromuscular re-education;Patient/family education;Manual techniques;Passive range of motion;Scar mobilization;Dry needling;Taping    PT Next Visit Plan  continue internal STM, lumbar stretch and core strength    PT Home Exercise Plan  Access Code: [email or web address]    Consulted and Agree with Plan of Care  Patient       Patient will benefit from skilled therapeutic intervention in order to improve the following deficits and impairments:  Abnormal gait, Pain, Increased fascial restricitons, Postural dysfunction, Increased muscle spasms, Decreased scar mobility, Decreased coordination, Decreased range of motion, Decreased strength, Obesity, Impaired flexibility, Difficulty walking  Visit Diagnosis: Muscle weakness (generalized)  Abnormal posture  Cramp and spasm     Problem List Patient Active Problem List   Diagnosis Date Noted  . Insomnia 03/03/2019  . Ankle arthritis 12/12/2018  . Muscle weakness 12/12/2018  . Obstructive sleep apnea 10/23/2018  . Asthmatic bronchitis 10/23/2018  . Cellulitis of internal cheek, left  01/23/2018  . Lung nodule 09/15/2017  . Morbid obesity with BMI of 45.0-49.9, adult ([place]) 09/05/2017  . Arthritis of carpometacarpal (CMC) joints of both thumbs 03/14/2017  . 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 Vertigo 10/12/2016  . Peripheral positional vertigo 08/28/2016  . Abnormal auditory perception of both ears 07/27/2016  . Neuropathic pain 06/25/2016  . 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 S/P lumbar spinal fusion 01/27/2016  . H/O therapeutic radiation 09/21/2015  . Personal history of breast cancer 09/21/2015  . Pes planus of both feet 08/04/2015  . Primary osteoarthritis of right knee 03/10/2015  . Osteoarthritis of right knee 02/04/2015  . Primary osteoarthritis of left knee 09/13/2014  . Hot flashes 01/19/2014  . Abdominal pain, unspecified site 01/19/2014  . Malignant neoplasm of upper-outer quadrant of left breast in female, estrogen receptor positive ([place]) 03/19/2013  . Lumbosacral spondylosis without myelopathy 12/30/2012  . Fibromyalgia syndrome 08/15/2012  . Peripheral neuropathy, toxic 08/15/2012  . Lymphedema of arm - left 04/30/2012    Jule Ser, PT 06/02/2019, 1:08 PM   [facility] [address] [place], [place], [address] Phone: [phone]   Fax:  [phone]  Name: Mallika Sanmiguel MRN: [id number] Date of Birth: [birth year]  PHYSICAL THERAPY DISCHARGE SUMMARY  Visits from Start of Care: 5  Current functional level related to goals / functional outcomes: See above goals   Remaining deficits: See above details   Education / Equipment: HEP Plan: Patient agrees to discharge.  Patient goals were not met. Patient is being discharged due to not returning since the last visit.  ?????      [employer], PT 08/03/19 11:30 AM

## 2019-06-03 ENCOUNTER — Encounter: Payer: Self-pay | Admitting: Registered Nurse

## 2019-06-04 ENCOUNTER — Other Ambulatory Visit: Payer: Self-pay

## 2019-06-04 ENCOUNTER — Ambulatory Visit
Admission: RE | Admit: 2019-06-04 | Discharge: 2019-06-04 | Disposition: A | Discharge status: Home / Self Care | Payer: Medicare HMO | Source: Ambulatory Visit | Attending: Specialist | Admitting: Specialist

## 2019-06-04 DIAGNOSIS — Z1231 Encounter for screening mammogram for malignant neoplasm of breast: Secondary | ICD-10-CM

## 2019-06-04 IMAGING — MG DIGITAL SCREENING BILAT W/ TOMO W/ CAD
6 of 10 series · 6 of 30 positions shown · non-contrast
Comparison: Previous exam(s).

CLINICAL DATA: Screening.

EXAM:
DIGITAL SCREENING BILATERAL MAMMOGRAM WITH TOMO AND CAD

[L MLO synth-2D]
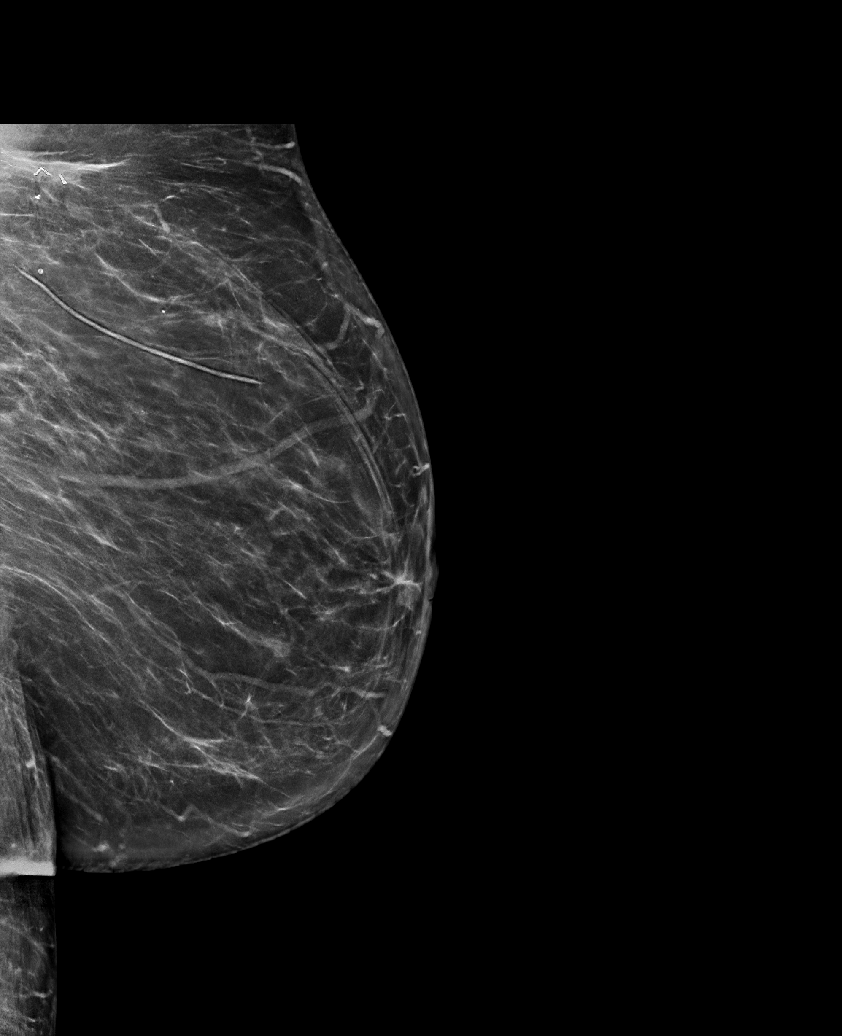

[L CC synth-2D (1 of 2)]
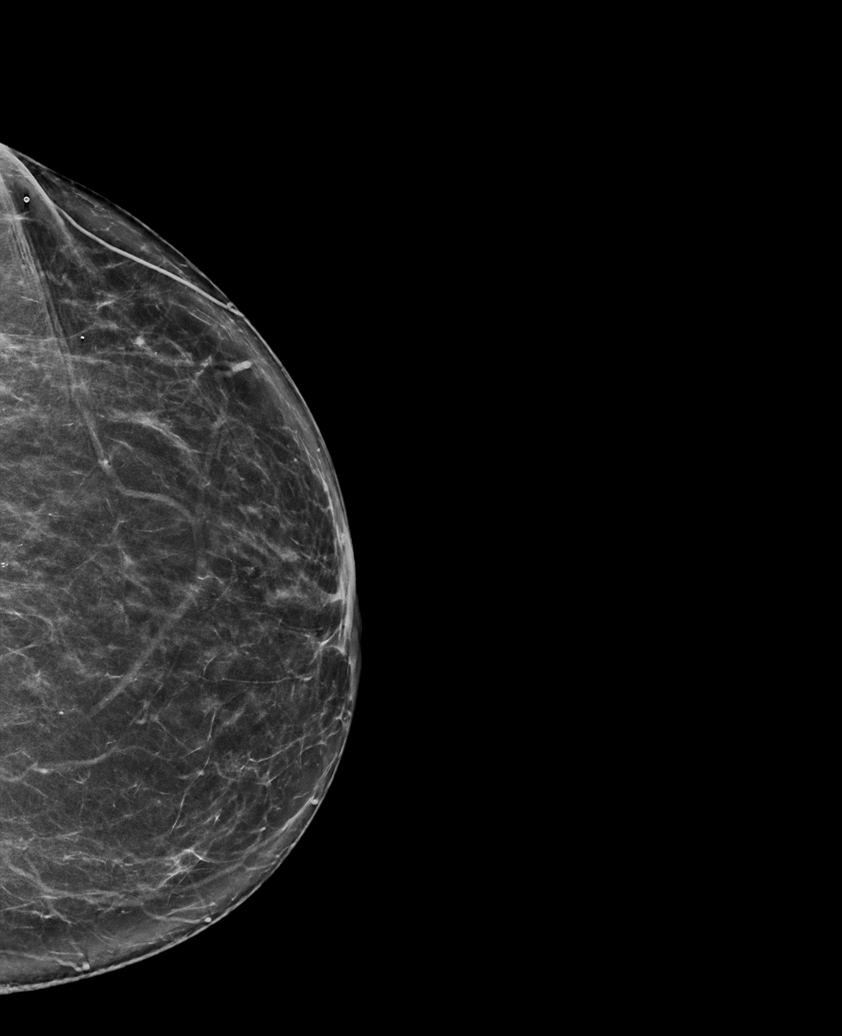

[L CC synth-2D (2 of 2)]
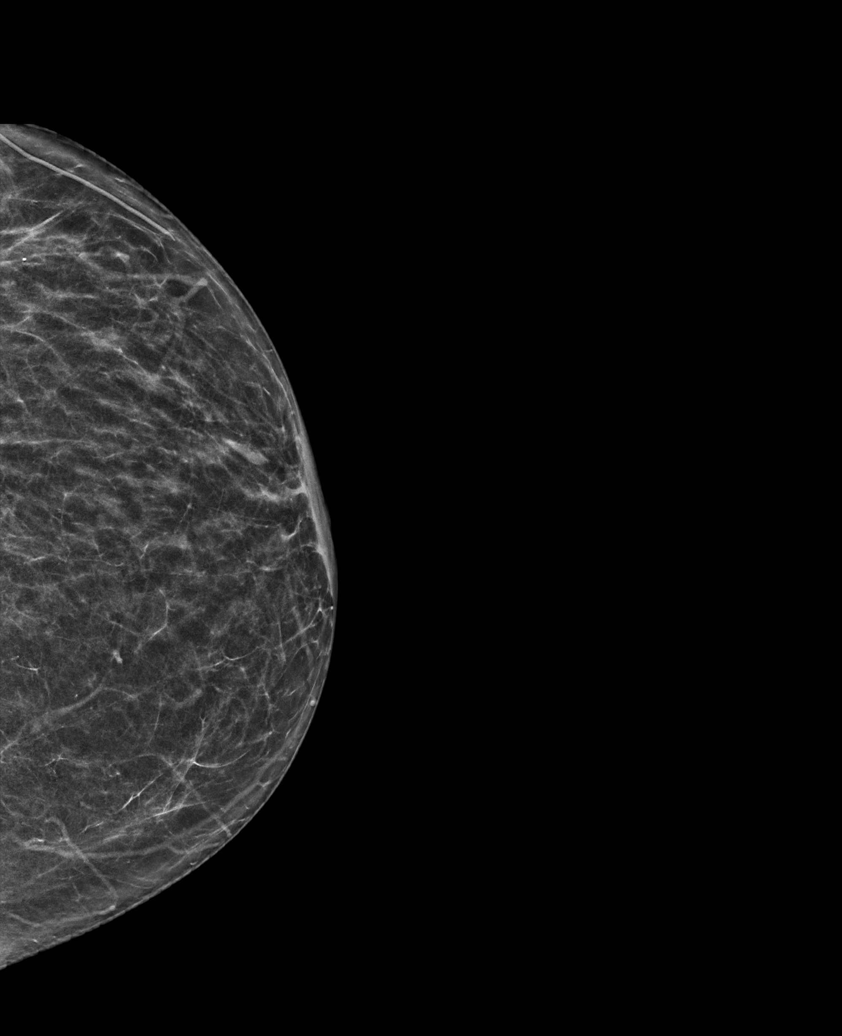

[R MLO synth-2D]
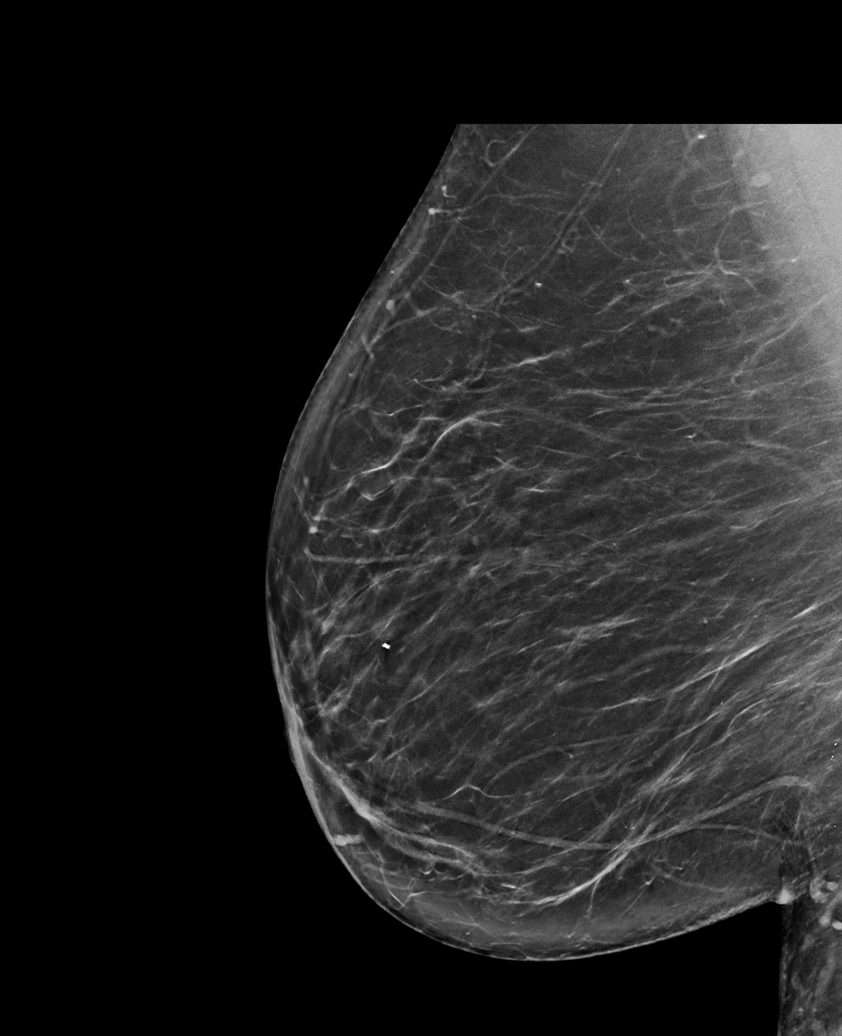

[R CC synth-2D]
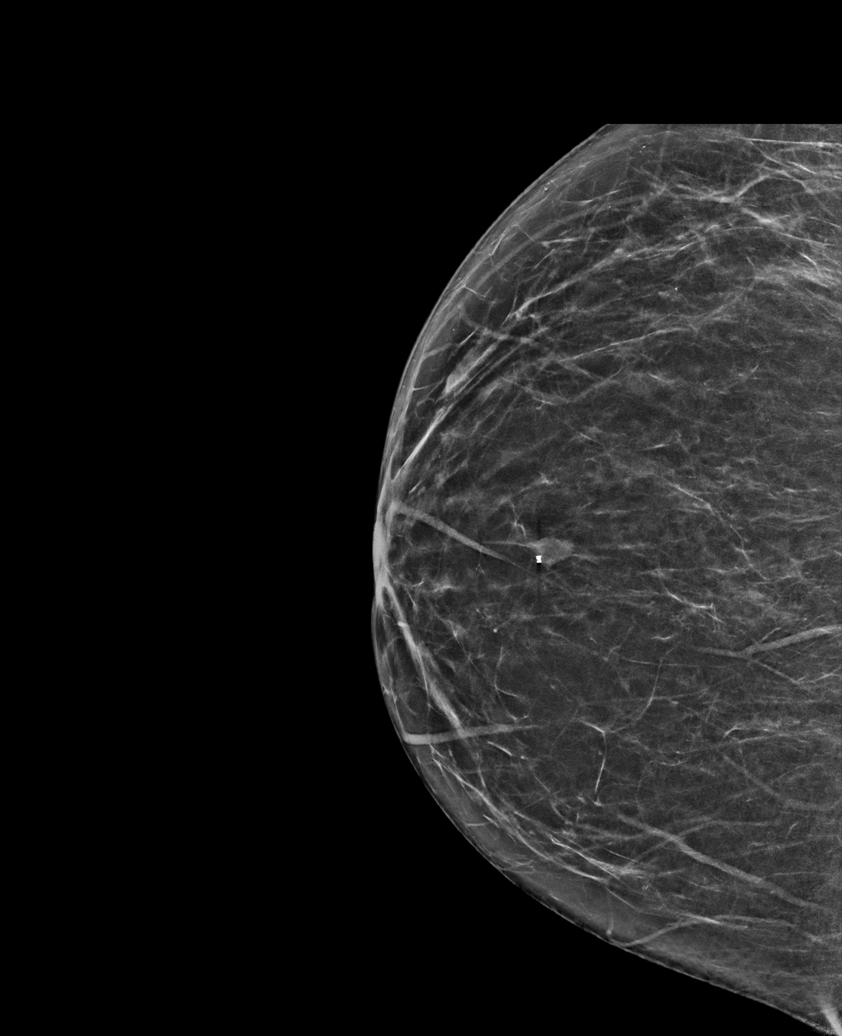

[L CC tomo · tomo slice 30/59.0]
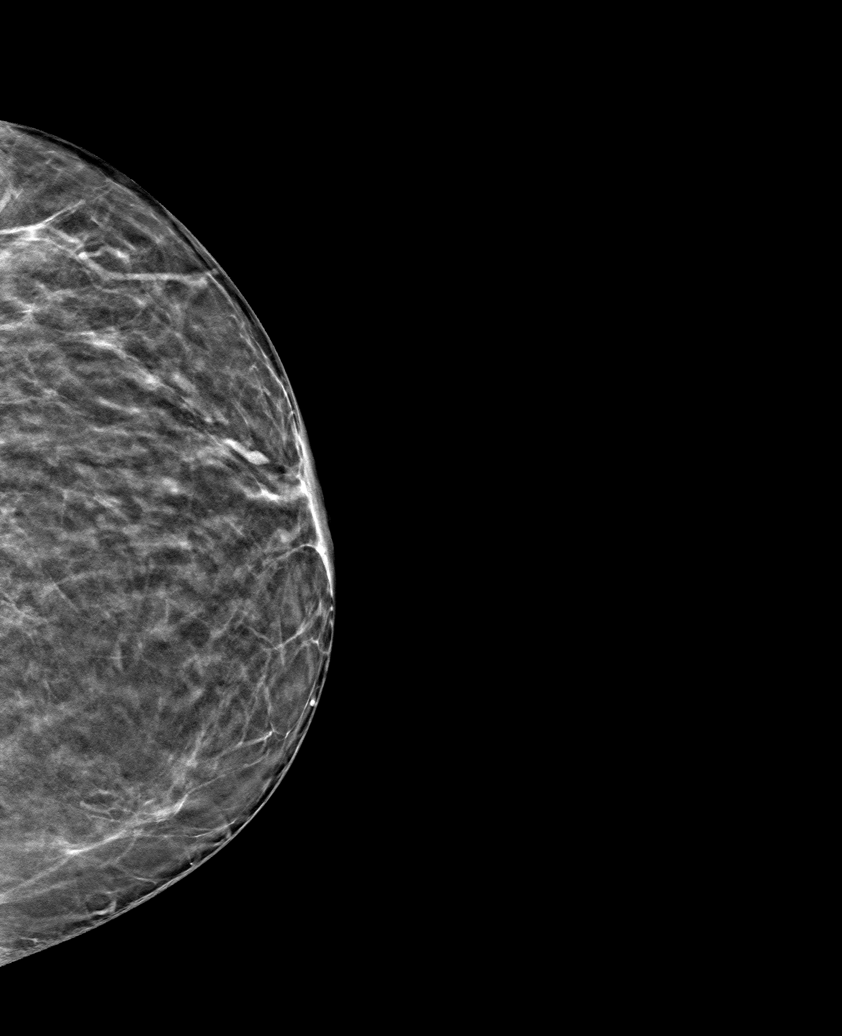

[6 of 30 positions shown; findings below may reference images not displayed]

ACR Breast Density Category b: There are scattered areas of
fibroglandular density.
FINDINGS: There are no findings suspicious for malignancy. Images were
processed with CAD.
IMPRESSION: No mammographic evidence of malignancy. A result letter of this
screening mammogram will be mailed directly to the patient.

RECOMMENDATION:
Screening mammogram in one year. (Code:CN-U-775)

BI-RADS CATEGORY  1: Negative.

## 2019-06-18 ENCOUNTER — Ambulatory Visit: Payer: Medicare HMO | Admitting: Physical Therapy

## 2019-06-21 ENCOUNTER — Encounter: Payer: Self-pay | Admitting: Internal Medicine

## 2019-06-21 DIAGNOSIS — K219 Gastro-esophageal reflux disease without esophagitis: Secondary | ICD-10-CM | POA: Insufficient documentation

## 2019-06-21 NOTE — Assessment & Plan Note (Signed)
Continue Anoro and Singulair Discuss alternatives to Lisinopril/ ACEI with PCP

## 2019-06-21 NOTE — Assessment & Plan Note (Signed)
Discussed f/u CT in 02/2020

## 2019-06-21 NOTE — Assessment & Plan Note (Signed)
Aware of reflux and heart burn. Explained this may contribute to her cough. Has Pepcid but hadn't been using it.  Plan- raise HOB, daily Pepcid before meal, reflux precautions, discuss with PCP

## 2019-06-21 NOTE — Assessment & Plan Note (Signed)
Benefits. Discussed compliance goals Plan- continue 5-15

## 2019-06-23 ENCOUNTER — Encounter: Payer: Medicare HMO | Attending: Physical Medicine & Rehabilitation | Admitting: Physical Medicine & Rehabilitation

## 2019-06-23 ENCOUNTER — Encounter: Payer: Medicare HMO | Admitting: Physical Medicine & Rehabilitation

## 2019-06-23 ENCOUNTER — Telehealth: Payer: Self-pay | Admitting: Podiatry

## 2019-06-23 ENCOUNTER — Encounter: Payer: Self-pay | Admitting: Physical Medicine & Rehabilitation

## 2019-06-23 ENCOUNTER — Other Ambulatory Visit: Payer: Self-pay

## 2019-06-23 VITALS — BP 143/83 | HR 87 | Temp 98.1°F | Ht 63.0 in | Wt 305.0 lb

## 2019-06-23 DIAGNOSIS — G4733 Obstructive sleep apnea (adult) (pediatric): Secondary | ICD-10-CM | POA: Diagnosis not present

## 2019-06-23 DIAGNOSIS — M545 Low back pain: Secondary | ICD-10-CM | POA: Diagnosis present

## 2019-06-23 DIAGNOSIS — G894 Chronic pain syndrome: Secondary | ICD-10-CM | POA: Insufficient documentation

## 2019-06-23 DIAGNOSIS — Z9889 Other specified postprocedural states: Secondary | ICD-10-CM | POA: Diagnosis not present

## 2019-06-23 DIAGNOSIS — Z5181 Encounter for therapeutic drug level monitoring: Secondary | ICD-10-CM | POA: Insufficient documentation

## 2019-06-23 DIAGNOSIS — M797 Fibromyalgia: Secondary | ICD-10-CM | POA: Diagnosis not present

## 2019-06-23 DIAGNOSIS — M533 Sacrococcygeal disorders, not elsewhere classified: Secondary | ICD-10-CM

## 2019-06-23 DIAGNOSIS — K219 Gastro-esophageal reflux disease without esophagitis: Secondary | ICD-10-CM | POA: Diagnosis not present

## 2019-06-23 DIAGNOSIS — F329 Major depressive disorder, single episode, unspecified: Secondary | ICD-10-CM | POA: Insufficient documentation

## 2019-06-23 DIAGNOSIS — G8929 Other chronic pain: Secondary | ICD-10-CM | POA: Diagnosis present

## 2019-06-23 DIAGNOSIS — G8918 Other acute postprocedural pain: Secondary | ICD-10-CM | POA: Diagnosis not present

## 2019-06-23 DIAGNOSIS — Z853 Personal history of malignant neoplasm of breast: Secondary | ICD-10-CM | POA: Insufficient documentation

## 2019-06-23 DIAGNOSIS — M48061 Spinal stenosis, lumbar region without neurogenic claudication: Secondary | ICD-10-CM | POA: Insufficient documentation

## 2019-06-23 DIAGNOSIS — Z79891 Long term (current) use of opiate analgesic: Secondary | ICD-10-CM | POA: Insufficient documentation

## 2019-06-23 MED ORDER — HYDROCODONE-ACETAMINOPHEN 7.5-325 MG PO TABS: ORAL_TABLET | ORAL | 0 refills | Status: DC

## 2019-06-23 MED ORDER — GABAPENTIN 300 MG PO CAPS: 300.0000 mg | ORAL_CAPSULE | Freq: Three times a day (TID) | ORAL | 1 refills | Status: DC

## 2019-06-23 NOTE — Telephone Encounter (Signed)
Pt left message about diabetic shoe and her pcp has changed.   I returned call and left message for pt to call to discuss.

## 2019-06-23 NOTE — Progress Notes (Signed)
  PROCEDURE RECORD [facility]   Name: Kelsey Wilson DOB:[birth year] MRN: [id number]  Date:06/23/2019  Physician: Alysia Penna, MD    Nurse/CMA: Bright CMA  Allergies:  Allergies  Allergen Reactions  . Aleve [Naproxen] Nausea Only  . Compazine [Prochlorperazine] Other (See Comments)    Numbness of face and  lips   . Hydrocodone Itching    High dose only  . Oxycodone Other (See Comments)    hallucinations   . Penicillins Nausea Only and Other (See Comments)    Has patient had a PCN reaction causing immediate rash, facial/tongue/throat swelling, SOB or lightheadedness with hypotension: No Has patient had a PCN reaction causing severe rash involving mucus membranes or skin necrosis: No Has patient had a PCN reaction that required hospitalization No Has patient had a PCN reaction occurring within the last 10 years: No If all of the above answers are "NO", then may proceed with Cephalosporin use.    Consent Signed: Yes.    Is patient diabetic? Yes.    CBG today? 27  Pregnant: No. LMP: No LMP recorded. Patient has had a hysterectomy. (age 40-55)  Anticoagulants: no Anti-inflammatory: no Antibiotics: no  Procedure: Bilateral Sacroiliac injections Position: Prone   Start Time: 112pm End Time: 118pm Fluoro Time: 32s  RN/CMA Bright CMA Bright CMAQ    Time 1249pm 123pm    BP 143/83 147/88    Pulse 87 91    Respirations 16 16    O2 Sat 95 97    S/S 6 6    Pain Level 6/10 5/10     D/C home with self, patient A & O X 3, D/C instructions reviewed, and sits independently.

## 2019-06-23 NOTE — Progress Notes (Signed)

## 2019-06-23 NOTE — Patient Instructions (Addendum)
Sacroiliac injection was performed today. A combination of a naming medicine plus a cortisone medicine was injected. The injection was done under x-ray guidance. This procedure has been performed to help reduce low back and buttocks pain as well as potentially hip pain. The duration of this injection is variable lasting from hours to  Months. It may repeated if needed.   Discontinue Lyrica Start gabapentin 300 mg 3 times per day  We reduced the hydrocodone from 100 tablets/month to 90 tablets/month as part of a slow weaning process

## 2019-06-25 NOTE — Telephone Encounter (Signed)
Pt called back and said  Peggye Pitt is her new doctor.And the fax # is [phone].  Upon looking she is a np. I called and Dr Willey Blade is her supervising provider so I have put paperwork in [facility] name for the diabetic shoes.

## 2019-07-20 ENCOUNTER — Encounter: Payer: Self-pay | Admitting: Registered Nurse

## 2019-07-20 ENCOUNTER — Other Ambulatory Visit: Payer: Self-pay

## 2019-07-20 ENCOUNTER — Encounter: Payer: Medicare HMO | Attending: Physical Medicine & Rehabilitation | Admitting: Registered Nurse

## 2019-07-20 VITALS — Ht 63.0 in | Wt 305.0 lb

## 2019-07-20 DIAGNOSIS — M62838 Other muscle spasm: Secondary | ICD-10-CM

## 2019-07-20 DIAGNOSIS — M545 Low back pain: Secondary | ICD-10-CM | POA: Insufficient documentation

## 2019-07-20 DIAGNOSIS — Z5181 Encounter for therapeutic drug level monitoring: Secondary | ICD-10-CM | POA: Insufficient documentation

## 2019-07-20 DIAGNOSIS — M961 Postlaminectomy syndrome, not elsewhere classified: Secondary | ICD-10-CM | POA: Diagnosis not present

## 2019-07-20 DIAGNOSIS — Z9889 Other specified postprocedural states: Secondary | ICD-10-CM | POA: Insufficient documentation

## 2019-07-20 DIAGNOSIS — G4733 Obstructive sleep apnea (adult) (pediatric): Secondary | ICD-10-CM | POA: Insufficient documentation

## 2019-07-20 DIAGNOSIS — M797 Fibromyalgia: Secondary | ICD-10-CM | POA: Diagnosis not present

## 2019-07-20 DIAGNOSIS — K219 Gastro-esophageal reflux disease without esophagitis: Secondary | ICD-10-CM | POA: Insufficient documentation

## 2019-07-20 DIAGNOSIS — M533 Sacrococcygeal disorders, not elsewhere classified: Secondary | ICD-10-CM

## 2019-07-20 DIAGNOSIS — G8918 Other acute postprocedural pain: Secondary | ICD-10-CM | POA: Insufficient documentation

## 2019-07-20 DIAGNOSIS — Z981 Arthrodesis status: Secondary | ICD-10-CM | POA: Diagnosis not present

## 2019-07-20 DIAGNOSIS — G8929 Other chronic pain: Secondary | ICD-10-CM | POA: Insufficient documentation

## 2019-07-20 DIAGNOSIS — F329 Major depressive disorder, single episode, unspecified: Secondary | ICD-10-CM | POA: Insufficient documentation

## 2019-07-20 DIAGNOSIS — M48061 Spinal stenosis, lumbar region without neurogenic claudication: Secondary | ICD-10-CM | POA: Insufficient documentation

## 2019-07-20 DIAGNOSIS — M47817 Spondylosis without myelopathy or radiculopathy, lumbosacral region: Secondary | ICD-10-CM

## 2019-07-20 DIAGNOSIS — Z79891 Long term (current) use of opiate analgesic: Secondary | ICD-10-CM | POA: Insufficient documentation

## 2019-07-20 DIAGNOSIS — G894 Chronic pain syndrome: Secondary | ICD-10-CM

## 2019-07-20 DIAGNOSIS — Z853 Personal history of malignant neoplasm of breast: Secondary | ICD-10-CM | POA: Insufficient documentation

## 2019-07-20 NOTE — Progress Notes (Signed)
Subjective:    Patient ID: Kelsey Wilson, female    DOB: 1961-04-14, 59 y.o.   MRN: [id number]  HPI : Kelsey Wilson is a 59 y.o. female whose appointment was changed to a virtual office visit to reduce the risk of exposure to the COVID-19 virus and to help Kelsey Wilson  remain healthy and safe. The virtual visit will also provide continuity of care. Kelsey Wilson agrees with virtual visit and verbalizes understanding. She states her pain is located in her lower back radiating into her buttocks and left lower extremity. She rates her  Pain 8. Her current exercise regime is walking and performing stretching exercises.  Kelsey Wilson Morphine equivalent is 22.50 MME.  Last UDS was Performed on 04/23/2019, it was consistent.   Kelsey Wilson CMA asked The Health and History Questions. This provider and Mancel Parsons verified we were speaking with the correct person using two identifiers.   Pain Inventory Average Pain 8 Pain Right Now 8 My pain is constant and aching  In the last 24 hours, has pain interfered with the following? General activity 9 Relation with others 9 Enjoyment of life 9 What TIME of day is your pain at its worst? always Sleep (in general) Fair  Pain is worse with: walking, bending, sitting, inactivity, standing and some activites Pain improves with: rest and medication Relief from Meds: 3  Mobility walk without assistance ability to climb steps?  yes do you drive?  yes  Function disabled: date disabled .  Neuro/Psych weakness numbness tremor trouble walking spasms anxiety  Prior Studies Any changes since last visit?  no  Physicians involved in your care Any changes since last visit?  no   Family History  Problem Relation Age of Onset  . Hypertension Mother   . Diabetes Mother   . Hypertension Father   . Diabetes Father   . Cancer Paternal Grandmother        unknown  . Breast cancer Paternal Aunt   . Colon cancer Neg Hx    Social  History   Socioeconomic History  . Marital status: Single    Spouse name: Not on file  . Number of children: 3  . Years of education: Not on file  . Highest education level: Not on file  Occupational History  . Not on file  Tobacco Use  . Smoking status: Never Smoker  . Smokeless tobacco: Never Used  Substance and Sexual Activity  . Alcohol use: No    Alcohol/week: 0.0 standard drinks  . Drug use: No  . Sexual activity: Never    Birth control/protection: Post-menopausal, Surgical  Other Topics Concern  . Not on file  Social History Narrative  . Not on file   Social Determinants of Health   Financial Resource Strain:   . Difficulty of Paying Living Expenses: Not on file  Food Insecurity:   . Worried About Charity fundraiser in the Last Year: Not on file  . Ran Out of Food in the Last Year: Not on file  Transportation Needs:   . Lack of Transportation (Medical): Not on file  . Lack of Transportation (Non-Medical): Not on file  Physical Activity:   . Days of Exercise per Week: Not on file  . Minutes of Exercise per Session: Not on file  Stress:   . Feeling of Stress : Not on file  Social Connections:   . Frequency of Communication with Friends and Family: Not on file  . Frequency of Social  Gatherings with Friends and Family: Not on file  . Attends Religious Services: Not on file  . Active Member of Clubs or Organizations: Not on file  . Attends Archivist Meetings: Not on file  . Marital Status: Not on file   Past Surgical History:  Procedure Laterality Date  . ABDOMINAL HYSTERECTOMY     still has ovaries  . APPENDECTOMY    . AXILLARY LYMPH NODE DISSECTION  11/28/2011   Procedure: AXILLARY LYMPH NODE DISSECTION;  Surgeon: Edward Jolly, MD;  Location: [place];  Service: General;  Laterality: Left;  . BREAST LUMPECTOMY Left 11/28/2011   Malignant  . BREAST SURGERY Left 2013  . CARPAL TUNNEL RELEASE     Bilateral  . CESAREAN SECTION     pt. has had  3  . CHOLECYSTECTOMY    . COLONOSCOPY    . EYE SURGERY Bilateral    cataract removal  . KNEE SURGERY     Left Knee  . LAMINECTOMY WITH POSTERIOR LATERAL ARTHRODESIS LEVEL 1 N/A 11/29/2017   Procedure: Posterior Lateral Fusion - Lumbar Four-Lumbar Five, removal and replacement of hardware, Laminectomy - Lumbar Four-Lumbar Five;  Surgeon: Eustace Moore, MD;  Location: [facility];  Service: Neurosurgery;  Laterality: N/A;  . LAMINECTOMY WITH POSTERIOR LATERAL ARTHRODESIS LEVEL 2 N/A 06/07/2017   Procedure: Posterior Lateral Fusion Lumbar Three-Four and Transforaminal Interbody Fusion Lumbar Four-Five with Segmental  Pedicle Screw Fixation;  Surgeon: Eustace Moore, MD;  Location: [facility];  Service: Neurosurgery;  Laterality: N/A;  Posterior Lateral Fusion Lumbar Three-Four and Transforaminal Interbody Fusion Lumbar Four-Five with Segmental  Pedicle Screw Fixation   . LUMBAR FUSION  06/07/2017   POST  . LUMBAR LAMINECTOMY/DECOMPRESSION MICRODISCECTOMY Bilateral 01/27/2016   Procedure: Laminectomy and Foraminotomy - Lumbar four -Lumbar five - bilateral- on-lay noninstrumented fusion;  Surgeon: Eustace Moore, MD;  Location: [place] [facility];  Service: Neurosurgery;  Laterality: Bilateral;  . MULTIPLE EXTRACTIONS WITH ALVEOLOPLASTY N/A 05/11/2016   Procedure: EXTRACTION OF TEETH EIGHTEEN, TWENTY AND TWENTY- NINE;  REMOVAL OF MANDIBULAR TORUS AND EXOSTOSIS;  Surgeon: Diona Browner, DDS;  Location: [place];  Service: Oral Surgery;  Laterality: N/A;  . PORTACATH PLACEMENT  06/18/2011   Procedure: INSERTION PORT-A-CATH;  Surgeon: Edward Jolly, MD;  Location: [place];  Service: General;  Laterality: Right;  right subclavian  . removal portacath  2014  . spur     Apex spur on both big toes  . TOE SURGERY Bilateral   . TONSILLECTOMY    . TOTAL KNEE ARTHROPLASTY Left 09/13/2014  . TOTAL KNEE ARTHROPLASTY Left 09/13/2014   Procedure: LEFT TOTAL KNEE ARTHROPLASTY;  Surgeon: Rod Can, MD;   Location: [place];  Service: Orthopedics;  Laterality: Left;  . TOTAL KNEE ARTHROPLASTY Right 03/10/2015   Procedure: RIGHT TOTAL KNEE ARTHROPLASTY;  Surgeon: Rod Can, MD;  [facility];  Service: Orthopedics;  Laterality: Right;   Past Medical History:  Diagnosis Date  . Anemia   . Anxiety   . Arthritis   . Breast cancer ([place]) 2010   T3N1 invasive ductal carcinoma left breast.Takes Arimidex daily  . Bursitis   . Carpal tunnel syndrome   . Chronic back pain    stenosis  . Constipation    takes Colace daily  . Depression    takes Benzotropine daily  . Diverticulitis of colon   . Dyspnea    daily when walking for over 1 yr.  . Fibromyalgia 08/2012  . GERD (gastroesophageal  reflux disease)    takes Dexilant daily  . Hemorrhoid   . History of blood transfusion    no abnormal reaction noted  . History of colon polyps    benign  . History of shingles   . Joint pain   . Joint swelling   . Night muscle spasms    takes Flexeril nightly as needed  . Nocturia   . OSA (obstructive sleep apnea)   . OSA on CPAP   . Peripheral edema    takes Furosemide.Just started 01/18/16  . Peripheral neuropathy    takes Lyrica daily  . Personal history of chemotherapy    2013  . Personal history of radiation therapy    2013  . Pneumonia    hx of-2015  . Pseudoarthrosis of lumbar spine   . Seasonal allergies    takes Singulair nightly  . SOB (shortness of breath) on exertion    rarely with exertion  . 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 Alphonzo Grieve)   There were no vitals taken for this visit.  Opioid Risk Score:   Fall Risk Score:  `1  Depression screen PHQ 2/9  Depression screen [facility] 2/9 08/04/2018 06/06/2018 04/04/2018 01/13/2018 12/13/2017 06/21/2017 03/07/2017  Decreased Interest 0 0 0 3 3 1 2   Down, Depressed, Hopeless 0 0 0 3 3 1 2   PHQ - 2 Score 0 0 0 6 6 2 4     Altered sleeping - - - - - - 0  Tired, decreased energy - - - - - - 3  Change in appetite - - - - - - 3  Feeling bad or failure about yourself  - - - - - - 2  Trouble concentrating - - - - - - 3  Moving slowly or fidgety/restless - - - - - - 3  Suicidal thoughts - - - - - - 0  PHQ-9 Score - - - - - - 18  Difficult doing work/chores - - - - - - Extremely dIfficult  Some recent data might be hidden    Review of Systems  Constitutional: Negative.   Eyes: Negative.   Respiratory: Negative.   Cardiovascular: Negative.   Gastrointestinal: Negative.   Endocrine: Negative.   Genitourinary: Negative.   Musculoskeletal: Positive for back pain and gait problem.  Allergic/Immunologic: Negative.   Neurological: Positive for tremors, weakness and numbness.  Psychiatric/Behavioral: The patient is nervous/anxious.        Objective:   Physical Exam Vitals and nursing note reviewed.  Musculoskeletal:     Comments: No Physical Exam: Virtual Visit           Assessment & Plan:  1. Chronic Sacroiliac Joint Pain: S/P Bilateral Sacroilliac Injection with Dr. Letta Pate on 06/23/2019. With good relief noted. 07/20/2019. 2.. Lumbar Postlaminectomy/ S/P Lumbar Fusion:Spinal Stenosis:S/PPosterior Lateral Fusion L-4-L-5 removal and replacement of hardware, Laminectomy L4-L5 by Dr. Ronnald Ramp on12/19/2019.  Continue HEP as Tolerated and Continue current medication regime. 07/20/2019. Continue withSlow Weaning:Hydrocodone7.5/325 mg one tablet every 8 hours as needed#90. Continue to Monitor.  3. Lumbar Radiculitis: Continue current medication regime with Gabapentin.07/20/2019 4. Fibromyalgia:ContinueHEP as Tolerated.Continue Current Medication Regimen with Gabapentin.07/20/2019 5.RightGreater Trochanter Bursitis:No complaints today.Continue to Alternate Ice and Heat Therapy.07/20/2019. 6. Muscle Spasm:Continue:Flexeril..Continue to Monitor.07/20/2019. 7. Chronic Pain Syndrome:  Continue Current medication regimen and HEP as Tolerated. Continue to Monitor.07/20/2019. 8. Bilateral OA of Bilateral Knees: Orthopedist following.Continue to Monitor.07/20/2019.  9. Bilateral Hand Pain:No complaints today.Continue to Monitor/ ? OA: Continue to Monitor.07/20/2019 10. Cervicalgia:No complaints Today.Continue to alternate Heat/Ice Therapy. Continue HEP as Tolerated.07/20/2019. 11. Chronic Bilateral Shoulders:Nocomplaints today.Continue to alternate Ice/ Heat therapy and HEP as Tolerated.07/20/2019. 12. Bilateral Ankle Pain: Podiatry Following:No complaints today.Continue to Monitor.07/20/2019   F/U in 1 month  Telephone Call Established Patient Location of Patient in her Home Location of Provider: Office Time Spent: 10 Minutes

## 2019-07-31 ENCOUNTER — Ambulatory Visit: Payer: Medicare HMO | Admitting: Cardiology

## 2019-07-31 ENCOUNTER — Other Ambulatory Visit: Payer: Self-pay

## 2019-07-31 VITALS — BP 142/94 | HR 77 | Temp 95.3°F | Resp 16 | Ht 63.0 in | Wt 297.0 lb

## 2019-07-31 DIAGNOSIS — R0989 Other specified symptoms and signs involving the circulatory and respiratory systems: Secondary | ICD-10-CM

## 2019-07-31 DIAGNOSIS — I517 Cardiomegaly: Secondary | ICD-10-CM

## 2019-07-31 DIAGNOSIS — E66813 Obesity, class 3: Secondary | ICD-10-CM

## 2019-07-31 DIAGNOSIS — R0602 Shortness of breath: Secondary | ICD-10-CM

## 2019-07-31 DIAGNOSIS — E782 Mixed hyperlipidemia: Secondary | ICD-10-CM

## 2019-07-31 NOTE — Progress Notes (Signed)
Primary Physician/Referring:  Vonna Drafts, FNP  Patient ID: Kelsey Wilson, female    DOB: 09/08/1960, 59 y.o.   MRN: [id number]  Chief Complaint  Patient presents with  . Shortness of Breath   HPI:    Kelsey Wilson  is a 59 y.o. African-American female with hypertension, hyperlipidemia, controlled diabetes mellitus, morbid obesity with obstructive sleep apnea on CPAP, allergic asthma is referred to me for evaluation of shortness of breath.  She also has history of left breast cancer SP lumpectomy followed by chemo and radiotherapy in 2018 and in remission.  Patient has noticed gradual worsening dyspnea over the past several years but in the past 6 to 8 months dyspnea has been severe enough that she has been unable to do even activities of daily living without having to stop.  She does have chronic orthopnea and sleeps with 2-3 pillows.  She has been compliant with her CPAP.  Denies chest pain.  No palpitations.  She also states that her activity has been limited due to severe back pain and arthritis in hips and also knee.  Recently received steroid injection to sacroiliac joint.  States that the discomfort has improved.  Past Medical History:  Diagnosis Date  . Anemia   . Anxiety   . Arthritis   . Breast cancer ([place]) 2010   T3N1 invasive ductal carcinoma left breast.Takes Arimidex daily  . Bursitis   . Carpal tunnel syndrome   . Chronic back pain    stenosis  . Constipation    takes Colace daily  . Depression    takes Benzotropine daily  . Diverticulitis of colon   . Dyspnea    daily when walking for over 1 yr.  . Fibromyalgia 08/2012  . GERD (gastroesophageal reflux disease)    takes Dexilant daily  . Hemorrhoid   . History of blood transfusion    no abnormal reaction noted  . History of colon polyps    benign  . History of shingles   . Joint pain   . Joint swelling   . Night muscle spasms    takes Flexeril nightly as needed  . Nocturia   . OSA  (obstructive sleep apnea)   . OSA on CPAP   . Peripheral edema    takes Furosemide.Just started 01/18/16  . Peripheral neuropathy    takes Lyrica daily  . Personal history of chemotherapy    2013  . Personal history of radiation therapy    2013  . Pneumonia    hx of-2015  . Pseudoarthrosis of lumbar spine   . Seasonal allergies    takes Singulair nightly  . SOB (shortness of breath) on exertion    rarely with exertion  . 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 Alphonzo Grieve)   Past Surgical History:  Procedure Laterality Date  . ABDOMINAL HYSTERECTOMY     still has ovaries  . APPENDECTOMY    . AXILLARY LYMPH NODE DISSECTION  11/28/2011   Procedure: AXILLARY LYMPH NODE DISSECTION;  Surgeon: Edward Jolly, MD;  Location: [facility];  Service: General;  Laterality: Left;  . BREAST LUMPECTOMY Left 11/28/2011   Malignant  . BREAST SURGERY Left 2013  . CARPAL TUNNEL RELEASE     Bilateral  . CESAREAN SECTION     pt. has had 3  . CHOLECYSTECTOMY    . COLONOSCOPY    . EYE  SURGERY Bilateral    cataract removal  . KNEE SURGERY     Left Knee  . LAMINECTOMY WITH POSTERIOR LATERAL ARTHRODESIS LEVEL 1 N/A 11/29/2017   Procedure: Posterior Lateral Fusion - Lumbar Four-Lumbar Five, removal and replacement of hardware, Laminectomy - Lumbar Four-Lumbar Five;  Surgeon: Eustace Moore, MD;  Location[facility];  Service: Neurosurgery;  Laterality: N/A;  . LAMINECTOMY WITH POSTERIOR LATERAL ARTHRODESIS LEVEL 2 N/A 06/07/2017   Procedure: Posterior Lateral Fusion Lumbar Three-Four and Transforaminal Interbody Fusion Lumbar Four-Five with Segmental  Pedicle Screw Fixation;  Surgeon: Eustace Moore, MD;  Location: [facility];  Service: Neurosurgery;  Laterality: N/A;  Posterior Lateral Fusion Lumbar Three-Four and Transforaminal Interbody Fusion Lumbar Four-Five with Segmental  Pedicle Screw Fixation    . LUMBAR FUSION  06/07/2017   POST  . LUMBAR LAMINECTOMY/DECOMPRESSION MICRODISCECTOMY Bilateral 01/27/2016   Procedure: Laminectomy and Foraminotomy - Lumbar four -Lumbar five - bilateral- on-lay noninstrumented fusion;  Surgeon: Eustace Moore, MD;  Location: [facility];  Service: Neurosurgery;  Laterality: Bilateral;  . MULTIPLE EXTRACTIONS WITH ALVEOLOPLASTY N/A 05/11/2016   Procedure: EXTRACTION OF TEETH EIGHTEEN, TWENTY AND TWENTY- NINE;  REMOVAL OF MANDIBULAR TORUS AND EXOSTOSIS;  Surgeon: Diona Browner, DDS;  Location: [facility];  Service: Oral Surgery;  Laterality: N/A;  . PORTACATH PLACEMENT  06/18/2011   Procedure: INSERTION PORT-A-CATH;  Surgeon: Edward Jolly, MD;  Location: [facility];  Service: General;  Laterality: Right;  right subclavian  . removal portacath  2014  . spur     Apex spur on both big toes  . TOE SURGERY Bilateral   . TONSILLECTOMY    . TOTAL KNEE ARTHROPLASTY Left 09/13/2014  . TOTAL KNEE ARTHROPLASTY Left 09/13/2014   Procedure: LEFT TOTAL KNEE ARTHROPLASTY;  Surgeon: Rod Can, MD;  Location: [facility];  Service: Orthopedics;  Laterality: Left;  . TOTAL KNEE ARTHROPLASTY Right 03/10/2015   Procedure: RIGHT TOTAL KNEE ARTHROPLASTY;  Surgeon: Rod Can, MD;  Location: [facility];  Service: Orthopedics;  Laterality: Right;   Family History  Problem Relation Age of Onset  . Hypertension Mother   . Diabetes Mother   . Hypertension Father   . Diabetes Father   . Cancer Paternal Grandmother        unknown  . Breast cancer Paternal Aunt   . Colon cancer Neg Hx     Social History   Tobacco Use  . Smoking status: Never Smoker  . Smokeless tobacco: Never Used  Substance Use Topics  . Alcohol use: No    Alcohol/week: 0.0 standard drinks   ROS  Review of Systems  Cardiovascular: Positive for dyspnea on exertion. Negative for chest pain and leg swelling.  Musculoskeletal: Positive for arthritis, back pain and joint pain (bilateral hips and  knee).  Gastrointestinal: Negative for melena.   Objective  Blood pressure (!) 142/94, pulse 77, temperature (!) 95.3 F (35.2 C), temperature source Temporal, resp. rate 16, height 5' 3"  (1.6 m), weight 297 lb (134.7 kg), SpO2 97 %.  Vitals with BMI 07/31/2019 07/20/2019 06/23/2019  Height 5' 3"  5' 3"  5' 3"   Weight 297 lbs 305 lbs 305 lbs  BMI 52.62 08.14 48.18  Systolic 563 (No Data) 149  Diastolic 94 (No Data) 83  Pulse 77 (No Data) 87     Physical Exam  Constitutional:  Morbidly obese in no acute distress.  Eyes: Conjunctivae are normal.  Cardiovascular: Normal rate, regular rhythm and normal heart sounds. Exam reveals no gallop.  No murmur heard. Pulses:      Carotid pulses are 2+ on the right side with bruit and 2+ on the left side.      Dorsalis pedis pulses are 2+ on the right side and 2+ on the left side.       Posterior tibial pulses are 2+ on the right side and 2+ on the left side.  Femoral and popliteal pulse difficult to feel due to patient's body habitus.  No leg edema. JVD difficult to see due to short neck.  Pulmonary/Chest: Effort normal and breath sounds normal.  Abdominal: Soft. Bowel sounds are normal.  Obese. Pannus present   Laboratory examination:   Recent Labs    02/13/19 1438 03/05/19 0946  NA 138 140  K 3.4* 4.0  CL 101 102  CO2 28 29  GLUCOSE 55* 114*  BUN 13 14  CREATININE 0.92 1.05*  CALCIUM 9.2 9.6  GFRNONAA >60 58*  GFRAA >60 >60   CrCl cannot be calculated (Patient's most recent lab result is older than the maximum 21 days allowed.).  CMP Latest Ref Rng & Units 03/05/2019 02/13/2019 03/11/2018  Glucose 70 - 99 mg/dL 114(H) 55(L) 145(H)  BUN 6 - 20 mg/dL 14 13 17   Creatinine 0.44 - 1.00 mg/dL 1.05(H) 0.92 0.90  Sodium 135 - 145 mmol/L 140 138 142  Potassium 3.5 - 5.1 mmol/L 4.0 3.4(L) 3.7  Chloride 98 - 111 mmol/L 102 101 105  CO2 22 - 32 mmol/L 29 28 27   Calcium 8.9 - 10.3 mg/dL 9.6 9.2 9.4  Total Protein 6.5 - 8.1 g/dL 6.9 7.3 7.1   Total Bilirubin 0.3 - 1.2 mg/dL 0.6 0.9 0.7  Alkaline Phos 38 - 126 U/L 75 85 100  AST 15 - 41 U/L 18 20 22   ALT 0 - 44 U/L 20 19 16    CBC Latest Ref Rng & Units 03/05/2019 02/13/2019 03/11/2018  WBC 4.0 - 10.5 K/uL 5.0 6.2 3.2(L)  Hemoglobin 12.0 - 15.0 g/dL 12.1 12.3 11.6(L)  Hematocrit 36.0 - 46.0 % 37.1 37.7 35.9(L)  Platelets 150 - 400 K/uL 224 247 184  External labs:   Labs 06/16/2019: BUN 19, creatinine 0.4, EGFR >61, CMP normal.  Serum glucose 83 mg.  Total cholesterol 191, triglycerides 221, HDL 50, and 106.  BNP 3.1.  TSH normal.    Medications and allergies   Allergies  Allergen Reactions  . Aleve [Naproxen] Nausea Only  . Compazine [Prochlorperazine] Other (See Comments)    Numbness of face and  lips   . Hydrocodone Itching    High dose only  . Oxycodone Other (See Comments)    hallucinations   . Penicillins Nausea Only and Other (See Comments)    Has patient had a PCN reaction causing immediate rash, facial/tongue/throat swelling, SOB or lightheadedness with hypotension: No Has patient had a PCN reaction causing severe rash involving mucus membranes or skin necrosis: No Has patient had a PCN reaction that required hospitalization No Has patient had a PCN reaction occurring within the last 10 years: No If all of the above answers are "NO", then may proceed with Cephalosporin use.     Current Outpatient Medications  Medication Instructions  . ACCU-CHEK AVIVA PLUS test strip TEST WHILE FASTING AND 2 HOURS AFTER SUPPER  . Accu-Chek Softclix Lancets lancets TEST AS DIRECTED AND 2 HOURS AFTER SUPPER  . B-D ULTRAFINE III SHORT PEN 31G X 8 MM MISC USE ONE NEEDLE D  . benzonatate (TESSALON) 200 mg, Oral, 3 times  daily PRN  . cyclobenzaprine (FLEXERIL) 5 MG tablet TAKE 1 TABLET(5 MG) BY MOUTH TWICE DAILY AS NEEDED FOR MUSCLE SPASMS  . Dexilant 30 mg, Oral, Daily  . DULoxetine (CYMBALTA) 60 mg, Oral, Daily  . gabapentin (NEURONTIN) 300 mg, Oral, 3 times daily  .  glipiZIDE (GLUCOTROL) 10 mg  . HYDROcodone-acetaminophen (NORCO) 7.5-325 MG tablet One tablet every 8 hours as needed for pain.  Do not fill before 07/08/2019.  Marland Kitchen lisinopril (ZESTRIL) 5 MG tablet No dose, route, or frequency recorded.  . metFORMIN (GLUCOPHAGE) 500 mg, Oral, 2 times daily  . montelukast (SINGULAIR) 10 mg, Oral, Daily at bedtime  . ondansetron (ZOFRAN) 4 mg, Oral, Every 8 hours PRN  . temazepam (RESTORIL) 15 mg, Oral, At bedtime PRN  . TRULICITY 0.37 CW/8.8QB SOPN SMARTSIG:0.5 Milliliter(s) SUB-Q Once a Week  . umeclidinium-vilanterol (ANORO ELLIPTA) 62.5-25 MCG/INH AEPB 1 puff, Inhalation, Daily  . Valbenazine Tosylate [facility]) 40 mg, Oral, Daily    Radiology:   CT angiogram of the chest 02/13/2019: 1. No change in an 8 mm pulmonary nodule of the lingula (series 7, image 67) as well as a 3 mm nodule of the dependent left lower lobe (series 5, image 70). Given the significant interval stability dating back to 09/13/2017, these are likely benign sequelae of infection or inflammation. Attention on follow-up as indicated by clinical oncologic protocol. 2.  Cardiomegaly. 3.  Hepatic steatosis.  Cardiac Studies:   Echocardiogram 06/20/2011: Left ventricle: The cavity size was normal. Systolic  function was normal. The estimated ejection fraction was in  the range of 55% to 60%. Wall motion was normal; there were  no regional wall motion abnormalities  Lexiscan nuclear stress test 03/29/2016:  Nuclear stress EF: 64%.  There was no ST segment deviation noted during stress.  Defect 1: There is a small defect of moderate severity present in the apex location.  The study is normal.  The left ventricular ejection fraction is normal (55-65%).  Normal stress nuclear study with mild apical thinning; no ischemia or infarction; EF 64 with normal wall motion  Right lower extremity venous duplex 01/06/2019: No evidence of DVT.  EKG 07/31/2019: Normal sinus rhythm at the rate  of 91 bpm, borderline criteria for left atrial enlargement, leftward axis.  No evidence of ischemia.      Assessment     ICD-10-CM   1. Cardiomegaly  I51.7 PCV ECHOCARDIOGRAM COMPLETE    PCV MYOCARDIAL PERFUSION WITH LEXISCAN  2. Shortness of breath  R06.02 EKG 12-Lead    PCV ECHOCARDIOGRAM COMPLETE    PCV MYOCARDIAL PERFUSION WITH LEXISCAN  3. Mixed hyperlipidemia  E78.2   4. Class 3 severe obesity due to excess calories with serious comorbidity and body mass index (BMI) of 50.0 to 59.9 in adult ([place])  E66.01    Z68.43   5. Right carotid bruit  R09.89 PCV CAROTID DUPLEX (BILATERAL)    No orders of the defined types were placed in this encounter.   Medications Discontinued During This Encounter  Medication Reason  . metFORMIN (GLUCOPHAGE) 850 MG tablet Error  . NARCAN 4 MG/0.1ML LIQD nasal spray kit Error  . Potassium Chloride ER 20 MEQ TBCR Error  . potassium chloride SA (K-DUR) 20 MEQ tablet Error  . TRESIBA FLEXTOUCH 200 UNIT/ML SOPN Error  . meloxicam (MOBIC) 7.5 MG tablet Error  . levocetirizine (XYZAL) 5 MG tablet Error  . ipratropium (ATROVENT) 0.03 % nasal spray Error  . Insulin Degludec (TRESIBA FLEXTOUCH [place]) Error  . ezetimibe (ZETIA)  10 MG tablet Error  . bumetanide (BUMEX) 2 MG tablet Error     Recommendations:   Kelsey Wilson  is a 59 y.o. African-American female with hypertension, hyperlipidemia, controlled diabetes mellitus, morbid obesity with obstructive sleep apnea on CPAP, allergic asthma is referred to me for evaluation of shortness of breath.  She also has history of left breast cancer SP lumpectomy followed by chemo and radiotherapy in 2018 and in remission.  Patient has had worsening dyspnea over the past several years but worse in the last 6 to 8 months, I suspect it is probably related to obesity hypoventilation.  Although morbidly obese with a BMI >50, vascular examination is relatively well-preserved except for right carotid bruit.  Her previous  BNP has been normal.  I evaluated all her previously performed labs that are available to me including imaging studies.  Extremely complex situation.  Schedule for carotid duplex for bruit/follow-up surveillance of carotid stenosis. Will schedule for an echocardiogram. Schedule for a 2 day Lexiscan Sestamibi stress test to evaluate for myocardial ischemia. Patient unable to do treadmill stress testing due to arthritis and dyspnea.   Weight loss stressed again and gave positive reinforcement. [facility] diet (low glycemic) sheet given to the patient.  Discussed regarding DASH diet and salt restriction. Bariatric program: I have discussed with the patient regarding the program to consider this and make effort to contact them. Information given to the patient about the program and eligibility. Office visit following the work-up/investigations. "Total time spent with patient was 70 minutes.  Adrian Prows, MD, [facility] 07/31/2019, 2:54 PM [facility] Office: [phone]  CC: Keturah Barre, MD

## 2019-08-01 ENCOUNTER — Encounter: Payer: Self-pay | Admitting: Cardiology

## 2019-08-07 ENCOUNTER — Encounter: Payer: Medicare HMO | Attending: Physical Medicine & Rehabilitation | Admitting: Registered Nurse

## 2019-08-07 ENCOUNTER — Other Ambulatory Visit: Payer: Self-pay

## 2019-08-07 ENCOUNTER — Encounter: Payer: Self-pay | Admitting: Registered Nurse

## 2019-08-07 VITALS — BP 124/85 | HR 87 | Temp 97.7°F | Ht 63.0 in | Wt 297.2 lb

## 2019-08-07 DIAGNOSIS — Z853 Personal history of malignant neoplasm of breast: Secondary | ICD-10-CM | POA: Insufficient documentation

## 2019-08-07 DIAGNOSIS — M545 Low back pain: Secondary | ICD-10-CM | POA: Insufficient documentation

## 2019-08-07 DIAGNOSIS — Z9889 Other specified postprocedural states: Secondary | ICD-10-CM | POA: Diagnosis not present

## 2019-08-07 DIAGNOSIS — F329 Major depressive disorder, single episode, unspecified: Secondary | ICD-10-CM | POA: Insufficient documentation

## 2019-08-07 DIAGNOSIS — Z79891 Long term (current) use of opiate analgesic: Secondary | ICD-10-CM | POA: Diagnosis present

## 2019-08-07 DIAGNOSIS — M961 Postlaminectomy syndrome, not elsewhere classified: Secondary | ICD-10-CM | POA: Diagnosis not present

## 2019-08-07 DIAGNOSIS — M797 Fibromyalgia: Secondary | ICD-10-CM | POA: Diagnosis not present

## 2019-08-07 DIAGNOSIS — Z5181 Encounter for therapeutic drug level monitoring: Secondary | ICD-10-CM | POA: Diagnosis present

## 2019-08-07 DIAGNOSIS — G894 Chronic pain syndrome: Secondary | ICD-10-CM

## 2019-08-07 DIAGNOSIS — G4733 Obstructive sleep apnea (adult) (pediatric): Secondary | ICD-10-CM | POA: Insufficient documentation

## 2019-08-07 DIAGNOSIS — Z981 Arthrodesis status: Secondary | ICD-10-CM | POA: Diagnosis not present

## 2019-08-07 DIAGNOSIS — M48061 Spinal stenosis, lumbar region without neurogenic claudication: Secondary | ICD-10-CM | POA: Insufficient documentation

## 2019-08-07 DIAGNOSIS — G8918 Other acute postprocedural pain: Secondary | ICD-10-CM | POA: Insufficient documentation

## 2019-08-07 DIAGNOSIS — M47817 Spondylosis without myelopathy or radiculopathy, lumbosacral region: Secondary | ICD-10-CM | POA: Diagnosis not present

## 2019-08-07 DIAGNOSIS — K219 Gastro-esophageal reflux disease without esophagitis: Secondary | ICD-10-CM | POA: Insufficient documentation

## 2019-08-07 DIAGNOSIS — M5416 Radiculopathy, lumbar region: Secondary | ICD-10-CM

## 2019-08-07 DIAGNOSIS — G8929 Other chronic pain: Secondary | ICD-10-CM | POA: Diagnosis present

## 2019-08-07 MED ORDER — HYDROCODONE-ACETAMINOPHEN 7.5-325 MG PO TABS: ORAL_TABLET | ORAL | 0 refills | Status: DC

## 2019-08-07 NOTE — Progress Notes (Signed)
Subjective:    Patient ID: Kelsey Wilson, female    DOB: December 14, 1960, 59 y.o.   MRN: [id number]  HPI: Kelsey Wilson is a 59 y.o. female who returns for follow up appointment for chronic pain and medication refill. She states her pain is located in her lower back radiating into her bilateral lower extremities L>R. She rates her  Pain 7. Her  current exercise regime is walking she reports she will be joining the [facility] with her silver Sneakers.   [name] Morphine equivalent is 22.50 MME.   Last UDS was Performed on 04/23/2019, it was consistent.   Pain Inventory Average Pain 7 Pain Right Now 7 My pain is aching  In the last 24 hours, has pain interfered with the following? General activity 2 Relation with others 2 Enjoyment of life 2 What TIME of day is your pain at its worst? all Sleep (in general) Fair  Pain is worse with: walking, bending, inactivity and standing Pain improves with: rest and medication Relief from Meds: 3  Mobility ability to climb steps?  yes do you drive?  yes  Function I need assistance with the following:  meal prep, household duties and shopping  Neuro/Psych numbness spasms anxiety  Prior Studies Any changes since last visit?  no  Physicians involved in your care Any changes since last visit?  no   Family History  Problem Relation Age of Onset  . Hypertension Mother   . Diabetes Mother   . Hypertension Father   . Diabetes Father   . Cancer Paternal Grandmother        unknown  . Breast cancer Paternal Aunt   . Colon cancer Neg Hx    Social History   Socioeconomic History  . Marital status: Single    Spouse name: Not on file  . Number of children: 3  . Years of education: Not on file  . Highest education level: Not on file  Occupational History  . Not on file  Tobacco Use  . Smoking status: Never Smoker  . Smokeless tobacco: Never Used  Substance and Sexual Activity  . Alcohol use: No    Alcohol/week: 0.0  standard drinks  . Drug use: No  . Sexual activity: Never    Birth control/protection: Post-menopausal, Surgical  Other Topics Concern  . Not on file  Social History Narrative  . Not on file   Social Determinants of Health   Financial Resource Strain:   . Difficulty of Paying Living Expenses: Not on file  Food Insecurity:   . Worried About Charity fundraiser in the Last Year: Not on file  . Ran Out of Food in the Last Year: Not on file  Transportation Needs:   . Lack of Transportation (Medical): Not on file  . Lack of Transportation (Non-Medical): Not on file  Physical Activity:   . Days of Exercise per Week: Not on file  . Minutes of Exercise per Session: Not on file  Stress:   . Feeling of Stress : Not on file  Social Connections:   . Frequency of Communication with Friends and Family: Not on file  . Frequency of Social Gatherings with Friends and Family: Not on file  . Attends Religious Services: Not on file  . Active Member of Clubs or Organizations: Not on file  . Attends Archivist Meetings: Not on file  . Marital Status: Not on file   Past Surgical History:  Procedure Laterality Date  . ABDOMINAL  HYSTERECTOMY     still has ovaries  . APPENDECTOMY    . AXILLARY LYMPH NODE DISSECTION  11/28/2011   Procedure: AXILLARY LYMPH NODE DISSECTION;  Surgeon: Edward Jolly, MD;  Location: [place];  Service: General;  Laterality: Left;  . BREAST LUMPECTOMY Left 11/28/2011   Malignant  . BREAST SURGERY Left 2013  . CARPAL TUNNEL RELEASE     Bilateral  . CESAREAN SECTION     pt. has had 3  . CHOLECYSTECTOMY    . COLONOSCOPY    . EYE SURGERY Bilateral    cataract removal  . KNEE SURGERY     Left Knee  . LAMINECTOMY WITH POSTERIOR LATERAL ARTHRODESIS LEVEL 1 N/A 11/29/2017   Procedure: Posterior Lateral Fusion - Lumbar Four-Lumbar Five, removal and replacement of hardware, Laminectomy - Lumbar Four-Lumbar Five;  Surgeon: Eustace Moore, MD;  Location: [facility] OR;   Service: Neurosurgery;  Laterality: N/A;  . LAMINECTOMY WITH POSTERIOR LATERAL ARTHRODESIS LEVEL 2 N/A 06/07/2017   Procedure: Posterior Lateral Fusion Lumbar Three-Four and Transforaminal Interbody Fusion Lumbar Four-Five with Segmental  Pedicle Screw Fixation;  Surgeon: Eustace Moore, MD;  Location: [place];  Service: Neurosurgery;  Laterality: N/A;  Posterior Lateral Fusion Lumbar Three-Four and Transforaminal Interbody Fusion Lumbar Four-Five with Segmental  Pedicle Screw Fixation   . LUMBAR FUSION  06/07/2017   POST  . LUMBAR LAMINECTOMY/DECOMPRESSION MICRODISCECTOMY Bilateral 01/27/2016   Procedure: Laminectomy and Foraminotomy - Lumbar four -Lumbar five - bilateral- on-lay noninstrumented fusion;  Surgeon: Eustace Moore, MD;  Location: [place] [facility];  Service: Neurosurgery;  Laterality: Bilateral;  . MULTIPLE EXTRACTIONS WITH ALVEOLOPLASTY N/A 05/11/2016   Procedure: EXTRACTION OF TEETH EIGHTEEN, TWENTY AND TWENTY- NINE;  REMOVAL OF MANDIBULAR TORUS AND EXOSTOSIS;  Surgeon: Diona Browner, DDS;  Location: [place];  Service: Oral Surgery;  Laterality: N/A;  . PORTACATH PLACEMENT  06/18/2011   Procedure: INSERTION PORT-A-CATH;  Surgeon: Edward Jolly, MD;  Location: [place];  Service: General;  Laterality: Right;  right subclavian  . removal portacath  2014  . spur     Apex spur on both big toes  . TOE SURGERY Bilateral   . TONSILLECTOMY    . TOTAL KNEE ARTHROPLASTY Left 09/13/2014  . TOTAL KNEE ARTHROPLASTY Left 09/13/2014   Procedure: LEFT TOTAL KNEE ARTHROPLASTY;  Surgeon: Rod Can, MD;  Location: [place];  Service: Orthopedics;  Laterality: Left;  . TOTAL KNEE ARTHROPLASTY Right 03/10/2015   Procedure: RIGHT TOTAL KNEE ARTHROPLASTY;  Surgeon: Rod Can, MD;  Location: [facility];  Service: Orthopedics;  Laterality: Right;   Past Medical History:  Diagnosis Date  . Anemia   . Anxiety   . Arthritis   . Breast cancer ([place]) 2010   T3N1 invasive ductal carcinoma left  breast.Takes Arimidex daily  . Bursitis   . Carpal tunnel syndrome   . Chronic back pain    stenosis  . Constipation    takes Colace daily  . Depression    takes Benzotropine daily  . Diverticulitis of colon   . Dyspnea    daily when walking for over 1 yr.  . Fibromyalgia 08/2012  . GERD (gastroesophageal reflux disease)    takes Dexilant daily  . Hemorrhoid   . History of blood transfusion    no abnormal reaction noted  . History of colon polyps    benign  . History of shingles   . Joint pain   . Joint swelling   . Night muscle spasms  takes Flexeril nightly as needed  . Nocturia   . OSA (obstructive sleep apnea)   . OSA on CPAP   . Peripheral edema    takes Furosemide.Just started 01/18/16  . Peripheral neuropathy    takes Lyrica daily  . Personal history of chemotherapy    2013  . Personal history of radiation therapy    2013  . Pneumonia    hx of-2015  . Pseudoarthrosis of lumbar spine   . Seasonal allergies    takes Singulair nightly  . SOB (shortness of breath) on exertion    rarely with exertion  . 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 Alphonzo Grieve)   BP 124/85   Pulse 87   Temp 97.7 F (36.5 C)   Ht 5\' 3"  (1.6 m)   Wt 297 lb 3.2 oz (134.8 kg)   SpO2 93%   BMI 52.65 kg/m   Opioid Risk Score:   Fall Risk Score:  `1  Depression screen PHQ 2/9  Depression screen [employer] 2/9 08/04/2018 06/06/2018 04/04/2018 01/13/2018 12/13/2017 06/21/2017 03/07/2017  Decreased Interest 0 0 0 3 3 1 2   Down, Depressed, Hopeless 0 0 0 3 3 1 2   PHQ - 2 Score 0 0 0 6 6 2 4   Altered sleeping - - - - - - 0  Tired, decreased energy - - - - - - 3  Change in appetite - - - - - - 3  Feeling bad or failure about yourself  - - - - - - 2  Trouble concentrating - - - - - - 3  Moving slowly or fidgety/restless - - - - - - 3  Suicidal thoughts - - - - - - 0  PHQ-9 Score - -  - - - - 18  Difficult doing work/chores - - - - - - Extremely dIfficult  Some recent data might be hidden    Review of Systems  Musculoskeletal: Positive for back pain.       Spasms  Neurological: Positive for numbness.  Psychiatric/Behavioral: The patient is nervous/anxious.   All other systems reviewed and are negative.      Objective:   Physical Exam Vitals and nursing note reviewed.  Constitutional:      Appearance: Normal appearance. She is obese.  Cardiovascular:     Rate and Rhythm: Normal rate and regular rhythm.     Pulses: Normal pulses.     Heart sounds: Normal heart sounds.  Pulmonary:     Effort: Pulmonary effort is normal.     Breath sounds: Normal breath sounds.  Musculoskeletal:     Cervical back: Normal range of motion and neck supple.     Comments: Normal Muscle Bulk and Muscle Testing Reveals:  Upper Extremities: Full ROM and Muscle Strength 5/5 Lumbar Paraspinal Tenderness: L-3-L-5 Lower Extremities: Full ROM and Muscle Strength 5/5 Arises from Table Slowly Narrow Based Gait   Skin:    General: Skin is warm and dry.  Neurological:     Mental Status: She is alert and oriented to person, place, and time.  Psychiatric:        Mood and Affect: Mood normal.        Behavior: Behavior normal.           Assessment & Plan:  1. Chronic Sacroiliac Joint Pain: S/P Bilateral Sacroilliac Injection with Dr. Letta Pate on 06/23/2019. With good relief noted. 08/07/2019.  2.. Lumbar Postlaminectomy/ S/P Lumbar Fusion:Spinal Stenosis:S/PPosterior Lateral Fusion L-4-L-5 removal and replacement of hardware, Laminectomy L4-L5 by Dr. Ronnald Ramp on12/19/2019.  Continue HEP as Tolerated and Continue current medication regime. 08/07/2019. Refilled: Hydrocodone7.5/325 mg one tablet every 8 hours as needed#90. Continue to Monitor.  3. Lumbar Radiculitis: Continue current medication regime with Gabapentin.08/07/2019 4. Fibromyalgia:ContinueHEP as Tolerated.Continue  Current Medication Regimen with Gabapentin.08/07/2019 5.RightGreater Trochanter Bursitis:No complaints today.Continue to Alternate Ice and Heat Therapy.08/07/2019. 6. Muscle Spasm:Continue:Flexeril..Continue to Monitor.08/07/2019. 7. Chronic Pain Syndrome: Continue Current medication regimen and HEP as Tolerated. Continue to Monitor.08/07/2019. 8. Bilateral OA of Bilateral Knees: Orthopedist following.Continue to Monitor.08/07/2019. 9. Bilateral Hand Pain:No complaints today.Continue to Monitor/ ? OA: Continue to Monitor.08/07/2019 10. Cervicalgia:No complaints Today.Continue to alternate Heat/Ice Therapy. Continue HEP as Tolerated.08/07/2019. 11. Chronic Bilateral Shoulders:Nocomplaints today.Continue to alternate Ice/ Heat therapy and HEP as Tolerated.08/07/2019. 12. Bilateral Ankle Pain: Podiatry Following:No complaints today.Continue to Monitor.08/07/2019   F/U in 1 month  15 minutes of face to face patient care time was spent during this visit. All questions were encouraged and answered.

## 2019-08-11 ENCOUNTER — Other Ambulatory Visit: Payer: Self-pay

## 2019-08-11 ENCOUNTER — Ambulatory Visit: Payer: Medicare HMO

## 2019-08-11 DIAGNOSIS — R0989 Other specified symptoms and signs involving the circulatory and respiratory systems: Secondary | ICD-10-CM

## 2019-08-11 DIAGNOSIS — R0602 Shortness of breath: Secondary | ICD-10-CM

## 2019-08-11 DIAGNOSIS — I517 Cardiomegaly: Secondary | ICD-10-CM

## 2019-08-12 ENCOUNTER — Ambulatory Visit: Payer: Medicare HMO

## 2019-08-12 DIAGNOSIS — R0602 Shortness of breath: Secondary | ICD-10-CM

## 2019-08-12 DIAGNOSIS — I517 Cardiomegaly: Secondary | ICD-10-CM

## 2019-08-17 ENCOUNTER — Other Ambulatory Visit: Payer: Self-pay | Admitting: Physical Medicine & Rehabilitation

## 2019-08-17 ENCOUNTER — Other Ambulatory Visit: Payer: Medicare HMO

## 2019-08-20 ENCOUNTER — Other Ambulatory Visit: Payer: Self-pay | Admitting: *Deleted

## 2019-08-20 MED ORDER — GABAPENTIN 300 MG PO CAPS: ORAL_CAPSULE | ORAL | 0 refills | Status: DC

## 2019-08-21 ENCOUNTER — Encounter: Payer: Self-pay | Admitting: Nurse Practitioner

## 2019-08-24 ENCOUNTER — Other Ambulatory Visit: Payer: Self-pay

## 2019-08-24 MED ORDER — CYCLOBENZAPRINE HCL 5 MG PO TABS: ORAL_TABLET | ORAL | 3 refills | Status: DC

## 2019-08-28 ENCOUNTER — Encounter: Payer: Self-pay | Admitting: Cardiology

## 2019-08-28 ENCOUNTER — Ambulatory Visit: Payer: Medicare HMO | Admitting: Cardiology

## 2019-08-28 ENCOUNTER — Other Ambulatory Visit: Payer: Self-pay

## 2019-08-28 VITALS — BP 125/70 | HR 77 | Ht 63.0 in | Wt 300.0 lb

## 2019-08-28 DIAGNOSIS — Z6841 Body Mass Index (BMI) 40.0 and over, adult: Secondary | ICD-10-CM

## 2019-08-28 DIAGNOSIS — R0602 Shortness of breath: Secondary | ICD-10-CM

## 2019-08-28 DIAGNOSIS — E782 Mixed hyperlipidemia: Secondary | ICD-10-CM

## 2019-08-28 NOTE — Progress Notes (Signed)
Primary Physician/Referring:  Vonna Drafts, FNP  Patient ID: Kelsey Wilson, female    DOB: 08-13-1960, 59 y.o.   MRN: [id number]  Chief Complaint  Patient presents with  . Shortness of Breath    Follow up  . Results   HPI:    Kelsey Wilson  is a 59 y.o. African-American female with hypertension, hyperlipidemia, controlled diabetes mellitus, morbid obesity with obstructive sleep apnea on CPAP, allergic asthma presents for evaluation of shortness of breath.  She also has history of left breast cancer SP lumpectomy followed by chemo and radiotherapy in 2018 and in remission.  Since last office visit 6 weeks ago no new symptoms.  She has not had any chest pain, dizziness or palpitations.  She has made changes to her diet and is trying to lose weight.  Past Medical History:  Diagnosis Date  . Anemia   . Anxiety   . Arthritis   . Breast cancer ([place]) 2010   T3N1 invasive ductal carcinoma left breast.Takes Arimidex daily  . Bursitis   . Carpal tunnel syndrome   . Chronic back pain    stenosis  . Constipation    takes Colace daily  . Depression    takes Benzotropine daily  . Diverticulitis of colon   . Dyspnea    daily when walking for over 1 yr.  . Fibromyalgia 08/2012  . GERD (gastroesophageal reflux disease)    takes Dexilant daily  . Hemorrhoid   . History of blood transfusion    no abnormal reaction noted  . History of colon polyps    benign  . History of shingles   . Joint pain   . Joint swelling   . Night muscle spasms    takes Flexeril nightly as needed  . Nocturia   . OSA (obstructive sleep apnea)   . OSA on CPAP   . Peripheral edema    takes Furosemide.Just started 01/18/16  . Peripheral neuropathy    takes Lyrica daily  . Personal history of chemotherapy    2013  . Personal history of radiation therapy    2013  . Pneumonia    hx of-2015  . Pseudoarthrosis of lumbar spine   . Seasonal allergies    takes Singulair nightly  . SOB  (shortness of breath) on exertion    rarely with exertion  . 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 Alphonzo Grieve)   Past Surgical History:  Procedure Laterality Date  . ABDOMINAL HYSTERECTOMY     still has ovaries  . APPENDECTOMY    . AXILLARY LYMPH NODE DISSECTION  11/28/2011   Procedure: AXILLARY LYMPH NODE DISSECTION;  Surgeon: Edward Jolly, MD;  Location: [place];  Service: General;  Laterality: Left;  . BREAST LUMPECTOMY Left 11/28/2011   Malignant  . BREAST SURGERY Left 2013  . CARPAL TUNNEL RELEASE     Bilateral  . CESAREAN SECTION     pt. has had 3  . CHOLECYSTECTOMY    . COLONOSCOPY    . EYE SURGERY Bilateral    cataract removal  . KNEE SURGERY     Left Knee  . LAMINECTOMY WITH POSTERIOR LATERAL ARTHRODESIS LEVEL 1 N/A 11/29/2017   Procedure: Posterior Lateral Fusion - Lumbar Four-Lumbar Five, removal and replacement of hardware, Laminectomy - Lumbar Four-Lumbar Five;  Surgeon: Eustace Moore, MD;  Location: [facility] OR;  Service: Neurosurgery;  Laterality:  N/A;  . LAMINECTOMY WITH POSTERIOR LATERAL ARTHRODESIS LEVEL 2 N/A 06/07/2017   Procedure: Posterior Lateral Fusion Lumbar Three-Four and Transforaminal Interbody Fusion Lumbar Four-Five with Segmental  Pedicle Screw Fixation;  Surgeon: Eustace Moore, MD;  Location: [place];  Service: Neurosurgery;  Laterality: N/A;  Posterior Lateral Fusion Lumbar Three-Four and Transforaminal Interbody Fusion Lumbar Four-Five with Segmental  Pedicle Screw Fixation   . LUMBAR FUSION  06/07/2017   POST  . LUMBAR LAMINECTOMY/DECOMPRESSION MICRODISCECTOMY Bilateral 01/27/2016   Procedure: Laminectomy and Foraminotomy - Lumbar four -Lumbar five - bilateral- on-lay noninstrumented fusion;  Surgeon: Eustace Moore, MD;  Location: [place] [facility];  Service: Neurosurgery;  Laterality: Bilateral;  . MULTIPLE EXTRACTIONS WITH ALVEOLOPLASTY N/A  05/11/2016   Procedure: EXTRACTION OF TEETH EIGHTEEN, TWENTY AND TWENTY- NINE;  REMOVAL OF MANDIBULAR TORUS AND EXOSTOSIS;  Surgeon: Diona Browner, DDS;  Location: [place];  Service: Oral Surgery;  Laterality: N/A;  . PORTACATH PLACEMENT  06/18/2011   Procedure: INSERTION PORT-A-CATH;  Surgeon: Edward Jolly, MD;  Location: [place];  Service: General;  Laterality: Right;  right subclavian  . removal portacath  2014  . spur     Apex spur on both big toes  . TOE SURGERY Bilateral   . TONSILLECTOMY    . TOTAL KNEE ARTHROPLASTY Left 09/13/2014  . TOTAL KNEE ARTHROPLASTY Left 09/13/2014   Procedure: LEFT TOTAL KNEE ARTHROPLASTY;  Surgeon: Rod Can, MD;  Location: [place];  Service: Orthopedics;  Laterality: Left;  . TOTAL KNEE ARTHROPLASTY Right 03/10/2015   Procedure: RIGHT TOTAL KNEE ARTHROPLASTY;  Surgeon: Rod Can, MD;  Location: [facility];  Service: Orthopedics;  Laterality: Right;   Family History  Problem Relation Age of Onset  . Hypertension Mother   . Diabetes Mother   . Hypertension Father   . Diabetes Father   . Cancer Paternal Grandmother        unknown  . Breast cancer Paternal Aunt   . Hypertension Sister   . Hyperlipidemia Brother   . Colon cancer Neg Hx     Social History   Tobacco Use  . Smoking status: Never Smoker  . Smokeless tobacco: Never Used  Substance Use Topics  . Alcohol use: No    Alcohol/week: 0.0 standard drinks   ROS  Review of Systems  Cardiovascular: Positive for dyspnea on exertion. Negative for chest pain and leg swelling.  Musculoskeletal: Positive for arthritis, back pain and joint pain (bilateral hips and knee).  Gastrointestinal: Negative for melena.   Objective  Blood pressure 125/70, pulse 77, height 5' 3"  (1.6 m), weight 300 lb (136.1 kg).  Vitals with BMI 08/28/2019 08/07/2019 07/31/2019  Height 5' 3"  5' 3"  5' 3"   Weight 300 lbs 297 lbs 3 oz 297 lbs  BMI 53.16 69.45 03.88  Systolic 828 003 491  Diastolic 70 85  94  Pulse 77 87 77     Physical Exam  Constitutional:  Morbidly obese in no acute distress.  Eyes: Conjunctivae are normal.  Cardiovascular: Normal rate, regular rhythm and normal heart sounds. Exam reveals no gallop.  No murmur heard. Pulses:      Carotid pulses are 2+ on the right side with bruit and 2+ on the left side.      Dorsalis pedis pulses are 2+ on the right side and 2+ on the left side.       Posterior tibial pulses are 2+ on the right side and 2+ on the left side.  Femoral and popliteal  pulse difficult to feel due to patient's body habitus.  No leg edema. JVD difficult to see due to short neck.  Pulmonary/Chest: Effort normal and breath sounds normal.  Abdominal: Soft. Bowel sounds are normal.  Obese. Pannus present   Laboratory examination:   Recent Labs    02/13/19 1438 03/05/19 0946  NA 138 140  K 3.4* 4.0  CL 101 102  CO2 28 29  GLUCOSE 55* 114*  BUN 13 14  CREATININE 0.92 1.05*  CALCIUM 9.2 9.6  GFRNONAA >60 58*  GFRAA >60 >60   CrCl cannot be calculated (Patient's most recent lab result is older than the maximum 21 days allowed.).  CMP Latest Ref Rng & Units 03/05/2019 02/13/2019 03/11/2018  Glucose 70 - 99 mg/dL 114(H) 55(L) 145(H)  BUN 6 - 20 mg/dL 14 13 17   Creatinine 0.44 - 1.00 mg/dL 1.05(H) 0.92 0.90  Sodium 135 - 145 mmol/L 140 138 142  Potassium 3.5 - 5.1 mmol/L 4.0 3.4(L) 3.7  Chloride 98 - 111 mmol/L 102 101 105  CO2 22 - 32 mmol/L 29 28 27   Calcium 8.9 - 10.3 mg/dL 9.6 9.2 9.4  Total Protein 6.5 - 8.1 g/dL 6.9 7.3 7.1  Total Bilirubin 0.3 - 1.2 mg/dL 0.6 0.9 0.7  Alkaline Phos 38 - 126 U/L 75 85 100  AST 15 - 41 U/L 18 20 22   ALT 0 - 44 U/L 20 19 16    CBC Latest Ref Rng & Units 03/05/2019 02/13/2019 03/11/2018  WBC 4.0 - 10.5 K/uL 5.0 6.2 3.2(L)  Hemoglobin 12.0 - 15.0 g/dL 12.1 12.3 11.6(L)  Hematocrit 36.0 - 46.0 % 37.1 37.7 35.9(L)  Platelets 150 - 400 K/uL 224 247 184  External labs:   Labs 06/16/2019: BUN 19, creatinine 0.4, EGFR  >61, CMP normal.  Serum glucose 83 mg.  Total cholesterol 191, triglycerides 221, HDL 50, and 106.  BNP 3.1.  TSH normal.    Medications and allergies   Allergies  Allergen Reactions  . Aleve [Naproxen] Nausea Only  . Compazine [Prochlorperazine] Other (See Comments)    Numbness of face and  lips   . Hydrocodone Itching    High dose only  . Oxycodone Other (See Comments)    hallucinations   . Penicillins Nausea Only and Other (See Comments)    Has patient had a PCN reaction causing immediate rash, facial/tongue/throat swelling, SOB or lightheadedness with hypotension: No Has patient had a PCN reaction causing severe rash involving mucus membranes or skin necrosis: No Has patient had a PCN reaction that required hospitalization No Has patient had a PCN reaction occurring within the last 10 years: No If all of the above answers are "NO", then may proceed with Cephalosporin use.     Current Outpatient Medications  Medication Instructions  . ACCU-CHEK AVIVA PLUS test strip TEST WHILE FASTING AND 2 HOURS AFTER SUPPER  . Accu-Chek Softclix Lancets lancets TEST AS DIRECTED AND 2 HOURS AFTER SUPPER  . B-D ULTRAFINE III SHORT PEN 31G X 8 MM MISC USE ONE NEEDLE D  . benzonatate (TESSALON) 200 mg, Oral, 3 times daily PRN  . cyclobenzaprine (FLEXERIL) 5 MG tablet TAKE 1 TABLET(5 MG) BY MOUTH TWICE DAILY AS NEEDED FOR MUSCLE SPASMS  . Dexilant 30 mg, Oral, Daily  . DULoxetine (CYMBALTA) 60 mg, Oral, Daily  . gabapentin (NEURONTIN) 300 MG capsule TAKE 1 CAPSULE(300 MG) BY MOUTH THREE TIMES DAILY  . glipiZIDE (GLUCOTROL) 10 mg  . HYDROcodone-acetaminophen (NORCO) 7.5-325 MG tablet One tablet every 8  hours as needed for pain.  Do not fill before 08/12/2019.  Marland Kitchen lisinopril (ZESTRIL) 5 MG tablet No dose, route, or frequency recorded.  . metFORMIN (GLUCOPHAGE) 500 mg, Oral, 2 times daily  . montelukast (SINGULAIR) 10 mg, Oral, Daily at bedtime  . ondansetron (ZOFRAN) 4 mg, Oral, Every 8 hours PRN   . temazepam (RESTORIL) 15 mg, Oral, At bedtime PRN  . TRULICITY 0.34 VQ/2.5ZD SOPN SMARTSIG:0.5 Milliliter(s) SUB-Q Once a Week  . umeclidinium-vilanterol (ANORO ELLIPTA) 62.5-25 MCG/INH AEPB 1 puff, Inhalation, Daily  . Valbenazine Tosylate [facility]) 40 mg, Oral, Daily    Radiology:   CT angiogram of the chest 02/13/2019: 1. No change in an 8 mm pulmonary nodule of the lingula (series 7, image 67) as well as a 3 mm nodule of the dependent left lower lobe (series 5, image 70). Given the significant interval stability dating back to 09/13/2017, these are likely benign sequelae of infection or inflammation. Attention on follow-up as indicated by clinical oncologic protocol. 2.  Cardiomegaly. 3.  Hepatic steatosis.  Cardiac Studies:   Right lower extremity venous duplex 01/06/2019: No evidence of DVT.  Lexiscan Tetrofosmin Stress Test  08/12/2019: Non-diagnostic ECG stress. Myocardial perfusion is normal. Stress LV EF mildly depressed at 49% but appears normal visually.  No previous exam available for comparison. Compared to test report from 03/29/2016, apical thinning noted without ischemia, EF 55%. Overall no significant change. Low risk study.   Carotid artery duplex 08/11/2019:  Normal duplex evaluation of the right ICA. Duplex suggests stenosis in the right external carotid artery (<50%).  Minimal stenosis in the left internal carotid artery (minimal).  Antegrade right vertebral artery flow. Antegrade left vertebral artery  flow.  Follow up studies if clinically indicated. Right external carotid stenosis probable source of bruit.   Echocardiogram 08/11/2019:  Normal LV systolic function with visual EF 50-55%. Left ventricle cavity is normal in size. Mild left ventricular hypertrophy. Normal global wall motion. Normal diastolic filling pattern. Calculated EF 50%.  Mild mitral regurgitation.  Mild tricuspid regurgitation.  No other significant valvular abnormalities.  No  significant change compared to prior study dated 06/2011.  EKG   07/31/2019: Normal sinus rhythm at the rate of 91 bpm, borderline criteria for left atrial enlargement, leftward axis.  No evidence of ischemia.      Assessment     ICD-10-CM   1. Shortness of breath  R06.02   2. Mixed hyperlipidemia  E78.2   3. Class 3 severe obesity due to excess calories with serious comorbidity and body mass index (BMI) of 50.0 to 59.9 in adult [facility])  E66.01    Z68.43     No orders of the defined types were placed in this encounter.   There are no discontinued medications.   Recommendations:   Milana Salay  is a 59 y.o. African-American female with hypertension, hyperlipidemia, controlled diabetes mellitus, morbid obesity with obstructive sleep apnea on CPAP, allergic asthma is referred to me for evaluation of shortness of breath.  She also has history of left breast cancer SP lumpectomy followed by chemo and radiotherapy in 2018 and in remission.  Patient has had worsening dyspnea over the past several years but worse in the last 6 to 8 months, I suspect it is probably related to obesity hypoventilation.   Weight loss stressed again and gave positive reinforcement.  Low glycemic diet discussed.    I discussed the clinical findings and available test results with the patient and have reassured the patient. Unless  recurrent symptoms or worsening or new symptoms, I will see the patient on a PRN basis. Patient is pleased with the evaluation.   Adrian Prows, MD, [facility] 08/28/2019, 2:57 PM [facility] Office: [phone]  CC: Keturah Barre, MD

## 2019-09-11 ENCOUNTER — Encounter: Payer: Medicare HMO | Attending: Physical Medicine & Rehabilitation | Admitting: Registered Nurse

## 2019-09-11 ENCOUNTER — Encounter: Payer: Self-pay | Admitting: Registered Nurse

## 2019-09-11 ENCOUNTER — Other Ambulatory Visit: Payer: Self-pay

## 2019-09-11 VITALS — BP 114/77 | HR 80 | Temp 98.5°F | Ht 63.0 in | Wt 301.0 lb

## 2019-09-11 DIAGNOSIS — Z79891 Long term (current) use of opiate analgesic: Secondary | ICD-10-CM | POA: Insufficient documentation

## 2019-09-11 DIAGNOSIS — M5416 Radiculopathy, lumbar region: Secondary | ICD-10-CM | POA: Diagnosis not present

## 2019-09-11 DIAGNOSIS — Z853 Personal history of malignant neoplasm of breast: Secondary | ICD-10-CM | POA: Diagnosis not present

## 2019-09-11 DIAGNOSIS — G8918 Other acute postprocedural pain: Secondary | ICD-10-CM | POA: Insufficient documentation

## 2019-09-11 DIAGNOSIS — Z9889 Other specified postprocedural states: Secondary | ICD-10-CM | POA: Insufficient documentation

## 2019-09-11 DIAGNOSIS — M797 Fibromyalgia: Secondary | ICD-10-CM | POA: Diagnosis not present

## 2019-09-11 DIAGNOSIS — F329 Major depressive disorder, single episode, unspecified: Secondary | ICD-10-CM | POA: Diagnosis not present

## 2019-09-11 DIAGNOSIS — G8929 Other chronic pain: Secondary | ICD-10-CM | POA: Diagnosis present

## 2019-09-11 DIAGNOSIS — K219 Gastro-esophageal reflux disease without esophagitis: Secondary | ICD-10-CM | POA: Insufficient documentation

## 2019-09-11 DIAGNOSIS — M961 Postlaminectomy syndrome, not elsewhere classified: Secondary | ICD-10-CM | POA: Diagnosis not present

## 2019-09-11 DIAGNOSIS — Z5181 Encounter for therapeutic drug level monitoring: Secondary | ICD-10-CM | POA: Diagnosis present

## 2019-09-11 DIAGNOSIS — M48061 Spinal stenosis, lumbar region without neurogenic claudication: Secondary | ICD-10-CM | POA: Insufficient documentation

## 2019-09-11 DIAGNOSIS — M62838 Other muscle spasm: Secondary | ICD-10-CM

## 2019-09-11 DIAGNOSIS — G4733 Obstructive sleep apnea (adult) (pediatric): Secondary | ICD-10-CM | POA: Diagnosis not present

## 2019-09-11 DIAGNOSIS — Z981 Arthrodesis status: Secondary | ICD-10-CM | POA: Diagnosis not present

## 2019-09-11 DIAGNOSIS — M545 Low back pain: Secondary | ICD-10-CM | POA: Diagnosis present

## 2019-09-11 DIAGNOSIS — G894 Chronic pain syndrome: Secondary | ICD-10-CM | POA: Insufficient documentation

## 2019-09-11 DIAGNOSIS — M1711 Unilateral primary osteoarthritis, right knee: Secondary | ICD-10-CM

## 2019-09-11 DIAGNOSIS — M47817 Spondylosis without myelopathy or radiculopathy, lumbosacral region: Secondary | ICD-10-CM

## 2019-09-11 MED ORDER — HYDROCODONE-ACETAMINOPHEN 7.5-325 MG PO TABS: ORAL_TABLET | ORAL | 0 refills | Status: DC

## 2019-09-11 MED ORDER — GABAPENTIN 300 MG PO CAPS: ORAL_CAPSULE | ORAL | 3 refills | Status: DC

## 2019-09-11 NOTE — Progress Notes (Signed)
Subjective:    Patient ID: Kelsey Wilson, female    DOB: Nov 10, 1960, 59 y.o.   MRN: [id number]  HPI: Kelsey Wilson is a 59 y.o. female who returns for follow up appointment for chronic pain and medication refill. She states her  pain is located in her lower back radiating into her bilateral hips L>R and right lower extremity. She also reports right knee pain. She rates her  Pain 7. Her current exercise regime is walking, she was encouraged to increase her HEP she verbalizes understanding.  Kelsey Wilson Morphine equivalent is 22.50  MME.  Oral Swab ordered today.   Pain Inventory Average Pain 7 Pain Right Now 7 My pain is aching  In the last 24 hours, has pain interfered with the following? General activity 7 Relation with others 7 Enjoyment of life 7 What TIME of day is your pain at its worst? daytime and evening Sleep (in general) Fair  Pain is worse with: walking, bending, sitting and standing Pain improves with: rest and medication Relief from Meds: 5  Mobility ability to climb steps?  no do you drive?  yes  Function disabled: date disabled . I need assistance with the following:  meal prep and household duties  Neuro/Psych spasms depression  Prior Studies Any changes since last visit?  no  Physicians involved in your care Any changes since last visit?  no   Family History  Problem Relation Age of Onset  . Hypertension Mother   . Diabetes Mother   . Hypertension Father   . Diabetes Father   . Cancer Paternal Grandmother        unknown  . Breast cancer Paternal Aunt   . Hypertension Sister   . Hyperlipidemia Brother   . Colon cancer Neg Hx    Social History   Socioeconomic History  . Marital status: Single    Spouse name: Not on file  . Number of children: 3  . Years of education: Not on file  . Highest education level: Not on file  Occupational History  . Not on file  Tobacco Use  . Smoking status: Never Smoker  . Smokeless  tobacco: Never Used  Substance and Sexual Activity  . Alcohol use: No    Alcohol/week: 0.0 standard drinks  . Drug use: No  . Sexual activity: Never    Birth control/protection: Post-menopausal, Surgical  Other Topics Concern  . Not on file  Social History Narrative  . Not on file   Social Determinants of Health   Financial Resource Strain:   . Difficulty of Paying Living Expenses:   Food Insecurity:   . Worried About Charity fundraiser in the Last Year:   . Arboriculturist in the Last Year:   Transportation Needs:   . Film/video editor (Medical):   Marland Kitchen Lack of Transportation (Non-Medical):   Physical Activity:   . Days of Exercise per Week:   . Minutes of Exercise per Session:   Stress:   . Feeling of Stress :   Social Connections:   . Frequency of Communication with Friends and Family:   . Frequency of Social Gatherings with Friends and Family:   . Attends Religious Services:   . Active Member of Clubs or Organizations:   . Attends Archivist Meetings:   Marland Kitchen Marital Status:    Past Surgical History:  Procedure Laterality Date  . ABDOMINAL HYSTERECTOMY     still has ovaries  . APPENDECTOMY    .  AXILLARY LYMPH NODE DISSECTION  11/28/2011   Procedure: AXILLARY LYMPH NODE DISSECTION;  Surgeon: Edward Jolly, MD;  Location: [place];  Service: General;  Laterality: Left;  . BREAST LUMPECTOMY Left 11/28/2011   Malignant  . BREAST SURGERY Left 2013  . CARPAL TUNNEL RELEASE     Bilateral  . CESAREAN SECTION     pt. has had 3  . CHOLECYSTECTOMY    . COLONOSCOPY    . EYE SURGERY Bilateral    cataract removal  . KNEE SURGERY     Left Knee  . LAMINECTOMY WITH POSTERIOR LATERAL ARTHRODESIS LEVEL 1 N/A 11/29/2017   Procedure: Posterior Lateral Fusion - Lumbar Four-Lumbar Five, removal and replacement of hardware, Laminectomy - Lumbar Four-Lumbar Five;  Surgeon: Eustace Moore, MD;  Location: [facility] OR;  Service: Neurosurgery;  Laterality: N/A;  . LAMINECTOMY WITH  POSTERIOR LATERAL ARTHRODESIS LEVEL 2 N/A 06/07/2017   Procedure: Posterior Lateral Fusion Lumbar Three-Four and Transforaminal Interbody Fusion Lumbar Four-Five with Segmental  Pedicle Screw Fixation;  Surgeon: Eustace Moore, MD;  Location: [place];  Service: Neurosurgery;  Laterality: N/A;  Posterior Lateral Fusion Lumbar Three-Four and Transforaminal Interbody Fusion Lumbar Four-Five with Segmental  Pedicle Screw Fixation   . LUMBAR FUSION  06/07/2017   POST  . LUMBAR LAMINECTOMY/DECOMPRESSION MICRODISCECTOMY Bilateral 01/27/2016   Procedure: Laminectomy and Foraminotomy - Lumbar four -Lumbar five - bilateral- on-lay noninstrumented fusion;  Surgeon: Eustace Moore, MD;  Location: [place] [facility];  Service: Neurosurgery;  Laterality: Bilateral;  . MULTIPLE EXTRACTIONS WITH ALVEOLOPLASTY N/A 05/11/2016   Procedure: EXTRACTION OF TEETH EIGHTEEN, TWENTY AND TWENTY- NINE;  REMOVAL OF MANDIBULAR TORUS AND EXOSTOSIS;  Surgeon: Diona Browner, DDS;  Location: [place];  Service: Oral Surgery;  Laterality: N/A;  . PORTACATH PLACEMENT  06/18/2011   Procedure: INSERTION PORT-A-CATH;  Surgeon: Edward Jolly, MD;  Location: [place];  Service: General;  Laterality: Right;  right subclavian  . removal portacath  2014  . spur     Apex spur on both big toes  . TOE SURGERY Bilateral   . TONSILLECTOMY    . TOTAL KNEE ARTHROPLASTY Left 09/13/2014  . TOTAL KNEE ARTHROPLASTY Left 09/13/2014   Procedure: LEFT TOTAL KNEE ARTHROPLASTY;  Surgeon: Rod Can, MD;  Location: [place];  Service: Orthopedics;  Laterality: Left;  . TOTAL KNEE ARTHROPLASTY Right 03/10/2015   Procedure: RIGHT TOTAL KNEE ARTHROPLASTY;  Surgeon: Rod Can, MD;  Location: [facility];  Service: Orthopedics;  Laterality: Right;   Past Medical History:  Diagnosis Date  . Anemia   . Anxiety   . Arthritis   . Breast cancer ([place]) 2010   T3N1 invasive ductal carcinoma left breast.Takes Arimidex daily  . Bursitis   . Carpal tunnel  syndrome   . Chronic back pain    stenosis  . Constipation    takes Colace daily  . Depression    takes Benzotropine daily  . Diverticulitis of colon   . Dyspnea    daily when walking for over 1 yr.  . Fibromyalgia 08/2012  . GERD (gastroesophageal reflux disease)    takes Dexilant daily  . Hemorrhoid   . History of blood transfusion    no abnormal reaction noted  . History of colon polyps    benign  . History of shingles   . Joint pain   . Joint swelling   . Night muscle spasms    takes Flexeril nightly as needed  . Nocturia   . OSA (  obstructive sleep apnea)   . OSA on CPAP   . Peripheral edema    takes Furosemide.Just started 01/18/16  . Peripheral neuropathy    takes Lyrica daily  . Personal history of chemotherapy    2013  . Personal history of radiation therapy    2013  . Pneumonia    hx of-2015  . Pseudoarthrosis of lumbar spine   . Seasonal allergies    takes Singulair nightly  . SOB (shortness of breath) on exertion    rarely with exertion  . 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 Alphonzo Grieve)   BP 114/77   Pulse 80   Temp 98.5 F (36.9 C)   Ht 5\' 3"  (1.6 m)   Wt (!) 301 lb (136.5 kg)   SpO2 94%   BMI 53.32 kg/m   Opioid Risk Score:   Fall Risk Score:  `1  Depression screen PHQ 2/9  Depression screen [facility] 2/9 08/04/2018 06/06/2018 04/04/2018 01/13/2018 12/13/2017 06/21/2017 03/07/2017  Decreased Interest 0 0 0 3 3 1 2   Down, Depressed, Hopeless 0 0 0 3 3 1 2   PHQ - 2 Score 0 0 0 6 6 2 4   Altered sleeping - - - - - - 0  Tired, decreased energy - - - - - - 3  Change in appetite - - - - - - 3  Feeling bad or failure about yourself  - - - - - - 2  Trouble concentrating - - - - - - 3  Moving slowly or fidgety/restless - - - - - - 3  Suicidal thoughts - - - - - - 0  PHQ-9 Score - - - - - - 18  Difficult doing work/chores - - - - - - Extremely  dIfficult  Some recent data might be hidden    Review of Systems  Neurological:       Spasms  Psychiatric/Behavioral: Positive for dysphoric mood.  All other systems reviewed and are negative.      Objective:   Physical Exam Vitals and nursing note reviewed.  Constitutional:      Appearance: Normal appearance. She is obese.  Cardiovascular:     Rate and Rhythm: Normal rate and regular rhythm.     Pulses: Normal pulses.     Heart sounds: Normal heart sounds.  Pulmonary:     Effort: Pulmonary effort is normal.     Breath sounds: Normal breath sounds.  Musculoskeletal:     Cervical back: Normal range of motion and neck supple.     Comments: Normal Muscle Bulk and Muscle Testing Reveals:  Upper Extremities: Full ROM and Muscle Strength 5/5 Thoracic Paraspinal Tenderness: T-7-T-9 Lumbar Paraspinal Tenderness: L-3-L-5 Lower Extremities: Right: Decreased ROM and Muscle Strength 5/5 Right Lower Extremity Flexion Produces Pain into Right Patella Left: Full ROM and Muscle Strength 5/5 Arises from Table with ease Narrow Based Gait   Skin:    General: Skin is warm and dry.  Neurological:     Mental Status: She is alert and oriented to person, place, and time.  Psychiatric:        Mood and Affect: Mood normal.        Behavior: Behavior normal.           Assessment & Plan:  1. Chronic Sacroiliac Joint Pain: S/PBilateral Sacroilliac Injection with Dr. Berenice Bouton 06/23/2019. With good relief noted. 09/11/2019. 2.. Lumbar Postlaminectomy/  S/P Lumbar Fusion:Spinal Stenosis:S/PPosterior Lateral Fusion L-4-L-5 removal and replacement of hardware, Laminectomy L4-L5 by Dr. Ronnald Ramp on12/19/2019.  Continue HEP as Tolerated and Continue current medication regime.09/11/2019. Refilled: Hydrocodone7.5/325 mg one tablet every8hours as needed#90. Continue to Monitor.  3. Lumbar Radiculitis: Continue current medication regime withGabapentin.09/11/2019 4. Fibromyalgia:ContinueHEP  as Tolerated.Continue Current Medication Regimen withGabapentin.09/11/2019 5.RightGreater Trochanter Bursitis:No complaints today.Continue to Alternate Ice and Heat Therapy.09/11/2019. 6. Muscle Spasm:Continue:Flexeril..Continue to Monitor.09/11/2019. 7. Chronic Pain Syndrome: Continue Current medication regimen and HEP as Tolerated. Continue to Monitor.09/11/2019. 8. Bilateral OA of Bilateral Knees: Orthopedist following.Continue to Monitor.09/11/2019. 9. Bilateral Hand Pain:No complaints today.Continue to Monitor/ ? OA: Continue to Monitor.09/11/2019 10. Cervicalgia:No complaints Today.Continue to alternate Heat/Ice Therapy. Continue HEP as Tolerated.09/11/2019. 11. Chronic Bilateral Shoulders:Nocomplaints today.Continue to alternate Ice/ Heat therapy and HEP as Tolerated.09/11/2019. 12. Bilateral Ankle Pain: Podiatry Following:No complaints today.Continue to Monitor.09/11/2019  F/U in 1 month  15 minutes of face to face patient care time was spent during this visit. All questions were encouraged and answered.

## 2019-09-14 ENCOUNTER — Telehealth: Payer: Self-pay | Admitting: Internal Medicine

## 2019-09-14 DIAGNOSIS — G4733 Obstructive sleep apnea (adult) (pediatric): Secondary | ICD-10-CM

## 2019-09-14 NOTE — Telephone Encounter (Signed)
Called and spoke with patient. She states her straps to her CPAP mask are hurting her and the back of her head. DME has already given patient new straps.  She wants to know if there is a different Mask she can use.    Dr. Annamaria Boots please advise

## 2019-09-16 NOTE — Telephone Encounter (Signed)
Order- please schedule mask fitting at sleep center

## 2019-09-16 NOTE — Telephone Encounter (Signed)
Left message for patient to call back  

## 2019-09-17 NOTE — Telephone Encounter (Signed)
LMTCB x2 for pt 

## 2019-09-18 LAB — DRUG TOX MONITOR 1 W/CONF, ORAL FLD
Amphetamines: NEGATIVE ng/mL (ref <10)
Barbiturates: NEGATIVE ng/mL (ref <10)
Benzodiazepines: NEGATIVE ng/mL (ref <0.50)
Buprenorphine: NEGATIVE ng/mL (ref <0.10)
Buprenorphine: NEGATIVE ng/mL (ref <0.10)
Cocaine: NEGATIVE ng/mL (ref <5.0)
Codeine: NEGATIVE ng/mL (ref <2.5)
Dihydrocodeine: 3.7 ng/mL — ABNORMAL HIGH (ref <2.5)
Fentanyl: NEGATIVE ng/mL (ref <0.10)
Heroin Metabolite: NEGATIVE ng/mL (ref <1.0)
Hydrocodone: 20.3 ng/mL — ABNORMAL HIGH (ref <2.5)
Hydromorphone: NEGATIVE ng/mL (ref <2.5)
MARIJUANA: NEGATIVE ng/mL (ref <2.5)
MDMA: NEGATIVE ng/mL (ref <10)
Meprobamate: NEGATIVE ng/mL (ref <2.5)
Methadone: NEGATIVE ng/mL (ref <5.0)
Morphine: NEGATIVE ng/mL (ref <2.5)
Naloxone: NEGATIVE ng/mL (ref <0.25)
Nicotine Metabolite: NEGATIVE ng/mL (ref <5.0)
Norbuprenorphine: NEGATIVE ng/mL (ref <0.50)
Norhydrocodone: NEGATIVE ng/mL (ref <2.5)
Noroxycodone: NEGATIVE ng/mL (ref <2.5)
Opiates: POSITIVE ng/mL — AB (ref <2.5)
Oxycodone: NEGATIVE ng/mL (ref <2.5)
Oxymorphone: NEGATIVE ng/mL (ref <2.5)
Phencyclidine: NEGATIVE ng/mL (ref <10)
Tapentadol: NEGATIVE ng/mL (ref <5.0)
Tramadol: NEGATIVE ng/mL (ref <5.0)
Zolpidem: NEGATIVE ng/mL (ref <5.0)

## 2019-09-18 LAB — DRUG TOX ALC METAB W/CON, ORAL FLD: Alcohol Metabolite: NEGATIVE ng/mL (ref <25)

## 2019-09-18 NOTE — Telephone Encounter (Signed)
Called pt and advised message from the provider. Pt understood and verbalized understanding. Nothing further is needed.   Order placed.  

## 2019-09-21 ENCOUNTER — Ambulatory Visit: Payer: Medicare HMO | Attending: Internal Medicine

## 2019-09-21 ENCOUNTER — Telehealth: Payer: Self-pay | Admitting: *Deleted

## 2019-09-21 DIAGNOSIS — Z20822 Contact with and (suspected) exposure to covid-19: Secondary | ICD-10-CM

## 2019-09-21 NOTE — Telephone Encounter (Signed)
Oral swab drug screen was consistent for prescribed medications.  ?

## 2019-09-22 LAB — NOVEL CORONAVIRUS, NAA: SARS-CoV-2, NAA: NOT DETECTED

## 2019-09-22 LAB — SARS-COV-2, NAA 2 DAY TAT

## 2019-09-30 ENCOUNTER — Telehealth: Payer: Self-pay | Admitting: Podiatry

## 2019-09-30 NOTE — Telephone Encounter (Signed)
Pt left message checking on status of diabetic shoes and would like to pick up paperwork to take to pcp tomorrow.  I returned call and printed the paperwork for her to pick up.Marland KitchenMarland Kitchen

## 2019-10-03 ENCOUNTER — Other Ambulatory Visit (HOSPITAL_COMMUNITY)
Admission: RE | Admit: 2019-10-03 | Discharge: 2019-10-03 | Disposition: A | Discharge status: Home / Self Care | Payer: Medicare HMO | Source: Ambulatory Visit | Attending: Internal Medicine | Admitting: Internal Medicine

## 2019-10-03 DIAGNOSIS — Z01812 Encounter for preprocedural laboratory examination: Secondary | ICD-10-CM | POA: Diagnosis present

## 2019-10-03 DIAGNOSIS — Z20822 Contact with and (suspected) exposure to covid-19: Secondary | ICD-10-CM | POA: Diagnosis not present

## 2019-10-03 LAB — SARS CORONAVIRUS 2 (TAT 6-24 HRS): SARS Coronavirus 2: NEGATIVE

## 2019-10-06 ENCOUNTER — Ambulatory Visit (HOSPITAL_BASED_OUTPATIENT_CLINIC_OR_DEPARTMENT_OTHER): Payer: Medicare HMO | Attending: Internal Medicine | Admitting: Internal Medicine

## 2019-10-06 ENCOUNTER — Other Ambulatory Visit: Payer: Self-pay

## 2019-10-06 DIAGNOSIS — G4733 Obstructive sleep apnea (adult) (pediatric): Secondary | ICD-10-CM

## 2019-10-08 ENCOUNTER — Encounter: Payer: Medicare HMO | Attending: Physical Medicine & Rehabilitation | Admitting: Registered Nurse

## 2019-10-08 ENCOUNTER — Other Ambulatory Visit: Payer: Self-pay

## 2019-10-08 VITALS — BP 111/77 | HR 97 | Temp 98.2°F | Ht 63.0 in | Wt 299.8 lb

## 2019-10-08 DIAGNOSIS — M961 Postlaminectomy syndrome, not elsewhere classified: Secondary | ICD-10-CM | POA: Diagnosis not present

## 2019-10-08 DIAGNOSIS — M48061 Spinal stenosis, lumbar region without neurogenic claudication: Secondary | ICD-10-CM | POA: Diagnosis not present

## 2019-10-08 DIAGNOSIS — M5416 Radiculopathy, lumbar region: Secondary | ICD-10-CM | POA: Diagnosis not present

## 2019-10-08 DIAGNOSIS — Z79891 Long term (current) use of opiate analgesic: Secondary | ICD-10-CM

## 2019-10-08 DIAGNOSIS — Z981 Arthrodesis status: Secondary | ICD-10-CM

## 2019-10-08 DIAGNOSIS — F329 Major depressive disorder, single episode, unspecified: Secondary | ICD-10-CM | POA: Diagnosis not present

## 2019-10-08 DIAGNOSIS — Z853 Personal history of malignant neoplasm of breast: Secondary | ICD-10-CM | POA: Diagnosis not present

## 2019-10-08 DIAGNOSIS — M545 Low back pain: Secondary | ICD-10-CM | POA: Insufficient documentation

## 2019-10-08 DIAGNOSIS — K219 Gastro-esophageal reflux disease without esophagitis: Secondary | ICD-10-CM | POA: Insufficient documentation

## 2019-10-08 DIAGNOSIS — G894 Chronic pain syndrome: Secondary | ICD-10-CM

## 2019-10-08 DIAGNOSIS — Z9889 Other specified postprocedural states: Secondary | ICD-10-CM | POA: Diagnosis not present

## 2019-10-08 DIAGNOSIS — Z5181 Encounter for therapeutic drug level monitoring: Secondary | ICD-10-CM | POA: Diagnosis present

## 2019-10-08 DIAGNOSIS — G8929 Other chronic pain: Secondary | ICD-10-CM | POA: Diagnosis present

## 2019-10-08 DIAGNOSIS — M47817 Spondylosis without myelopathy or radiculopathy, lumbosacral region: Secondary | ICD-10-CM | POA: Diagnosis not present

## 2019-10-08 DIAGNOSIS — M797 Fibromyalgia: Secondary | ICD-10-CM

## 2019-10-08 DIAGNOSIS — G4733 Obstructive sleep apnea (adult) (pediatric): Secondary | ICD-10-CM | POA: Insufficient documentation

## 2019-10-08 DIAGNOSIS — M7062 Trochanteric bursitis, left hip: Secondary | ICD-10-CM

## 2019-10-08 DIAGNOSIS — G8918 Other acute postprocedural pain: Secondary | ICD-10-CM | POA: Diagnosis not present

## 2019-10-08 DIAGNOSIS — M1711 Unilateral primary osteoarthritis, right knee: Secondary | ICD-10-CM

## 2019-10-08 MED ORDER — HYDROCODONE-ACETAMINOPHEN 7.5-325 MG PO TABS: ORAL_TABLET | ORAL | 0 refills | Status: DC

## 2019-10-08 NOTE — Progress Notes (Signed)
Subjective:    Patient ID: Kelsey Wilson, female    DOB: Jan 21, 1961, 59 y.o.   MRN: [id number]  HPI: Kelsey Wilson is a 59 y.o. female who returns for follow up appointment for chronic pain and medication refill. She states her pain is located in her lower back pain radiating into her left hip and left groin. Also reports right knee pain. She rates her pain 7. Her current exercise regime is walking and performing stretching exercises.  [name] Morphine equivalent is 22.50 MME.  Last Oral Swab was Performed on 09/11/2019, it was consistent.   Pain Inventory Average Pain 8 Pain Right Now 7 My pain is aching  In the last 24 hours, has pain interfered with the following? General activity 7 Relation with others 6 Enjoyment of life 6 What TIME of day is your pain at its worst? all Sleep (in general) Fair  Pain is worse with: walking, bending, sitting and standing Pain improves with: rest, heat/ice and medication Relief from Meds: 4  Mobility walk without assistance use a cane ability to climb steps?  yes do you drive?  yes  Function I need assistance with the following:  meal prep, household duties and shopping  Neuro/Psych numbness trouble walking spasms anxiety  Prior Studies Any changes since last visit?  no  Physicians involved in your care Any changes since last visit?  no   Family History  Problem Relation Age of Onset  . Hypertension Mother   . Diabetes Mother   . Hypertension Father   . Diabetes Father   . Cancer Paternal Grandmother        unknown  . Breast cancer Paternal Aunt   . Hypertension Sister   . Hyperlipidemia Brother   . Colon cancer Neg Hx    Social History   Socioeconomic History  . Marital status: Single    Spouse name: Not on file  . Number of children: 3  . Years of education: Not on file  . Highest education level: Not on file  Occupational History  . Not on file  Tobacco Use  . Smoking status: Never Smoker    . Smokeless tobacco: Never Used  Substance and Sexual Activity  . Alcohol use: No    Alcohol/week: 0.0 standard drinks  . Drug use: No  . Sexual activity: Never    Birth control/protection: Post-menopausal, Surgical  Other Topics Concern  . Not on file  Social History Narrative  . Not on file   Social Determinants of Health   Financial Resource Strain:   . Difficulty of Paying Living Expenses:   Food Insecurity:   . Worried About Charity fundraiser in the Last Year:   . Arboriculturist in the Last Year:   Transportation Needs:   . Film/video editor (Medical):   Marland Kitchen Lack of Transportation (Non-Medical):   Physical Activity:   . Days of Exercise per Week:   . Minutes of Exercise per Session:   Stress:   . Feeling of Stress :   Social Connections:   . Frequency of Communication with Friends and Family:   . Frequency of Social Gatherings with Friends and Family:   . Attends Religious Services:   . Active Member of Clubs or Organizations:   . Attends Archivist Meetings:   Marland Kitchen Marital Status:    Past Surgical History:  Procedure Laterality Date  . ABDOMINAL HYSTERECTOMY     still has ovaries  . APPENDECTOMY    .  AXILLARY LYMPH NODE DISSECTION  11/28/2011   Procedure: AXILLARY LYMPH NODE DISSECTION;  Surgeon: Edward Jolly, MD;  Location: [place];  Service: General;  Laterality: Left;  . BREAST LUMPECTOMY Left 11/28/2011   Malignant  . BREAST SURGERY Left 2013  . CARPAL TUNNEL RELEASE     Bilateral  . CESAREAN SECTION     pt. has had 3  . CHOLECYSTECTOMY    . COLONOSCOPY    . EYE SURGERY Bilateral    cataract removal  . KNEE SURGERY     Left Knee  . LAMINECTOMY WITH POSTERIOR LATERAL ARTHRODESIS LEVEL 1 N/A 11/29/2017   Procedure: Posterior Lateral Fusion - Lumbar Four-Lumbar Five, removal and replacement of hardware, Laminectomy - Lumbar Four-Lumbar Five;  Surgeon: Eustace Moore, MD;  Location: [facility] OR;  Service: Neurosurgery;  Laterality: N/A;  .  LAMINECTOMY WITH POSTERIOR LATERAL ARTHRODESIS LEVEL 2 N/A 06/07/2017   Procedure: Posterior Lateral Fusion Lumbar Three-Four and Transforaminal Interbody Fusion Lumbar Four-Five with Segmental  Pedicle Screw Fixation;  Surgeon: Eustace Moore, MD;  Location: [place];  Service: Neurosurgery;  Laterality: N/A;  Posterior Lateral Fusion Lumbar Three-Four and Transforaminal Interbody Fusion Lumbar Four-Five with Segmental  Pedicle Screw Fixation   . LUMBAR FUSION  06/07/2017   POST  . LUMBAR LAMINECTOMY/DECOMPRESSION MICRODISCECTOMY Bilateral 01/27/2016   Procedure: Laminectomy and Foraminotomy - Lumbar four -Lumbar five - bilateral- on-lay noninstrumented fusion;  Surgeon: Eustace Moore, MD;  Location: [place] [facility];  Service: Neurosurgery;  Laterality: Bilateral;  . MULTIPLE EXTRACTIONS WITH ALVEOLOPLASTY N/A 05/11/2016   Procedure: EXTRACTION OF TEETH EIGHTEEN, TWENTY AND TWENTY- NINE;  REMOVAL OF MANDIBULAR TORUS AND EXOSTOSIS;  Surgeon: Diona Browner, DDS;  Location: [place];  Service: Oral Surgery;  Laterality: N/A;  . PORTACATH PLACEMENT  06/18/2011   Procedure: INSERTION PORT-A-CATH;  Surgeon: Edward Jolly, MD;  Location: [place];  Service: General;  Laterality: Right;  right subclavian  . removal portacath  2014  . spur     Apex spur on both big toes  . TOE SURGERY Bilateral   . TONSILLECTOMY    . TOTAL KNEE ARTHROPLASTY Left 09/13/2014  . TOTAL KNEE ARTHROPLASTY Left 09/13/2014   Procedure: LEFT TOTAL KNEE ARTHROPLASTY;  Surgeon: Rod Can, MD;  Location: [place];  Service: Orthopedics;  Laterality: Left;  . TOTAL KNEE ARTHROPLASTY Right 03/10/2015   Procedure: RIGHT TOTAL KNEE ARTHROPLASTY;  Surgeon: Rod Can, MD;  Location: [facility];  Service: Orthopedics;  Laterality: Right;   Past Medical History:  Diagnosis Date  . Anemia   . Anxiety   . Arthritis   . Breast cancer ([place]) 2010   T3N1 invasive ductal carcinoma left breast.Takes Arimidex daily  . Bursitis   .  Carpal tunnel syndrome   . Chronic back pain    stenosis  . Constipation    takes Colace daily  . Depression    takes Benzotropine daily  . Diverticulitis of colon   . Dyspnea    daily when walking for over 1 yr.  . Fibromyalgia 08/2012  . GERD (gastroesophageal reflux disease)    takes Dexilant daily  . Hemorrhoid   . History of blood transfusion    no abnormal reaction noted  . History of colon polyps    benign  . History of shingles   . Joint pain   . Joint swelling   . Night muscle spasms    takes Flexeril nightly as needed  . Nocturia   . OSA (  obstructive sleep apnea)   . OSA on CPAP   . Peripheral edema    takes Furosemide.Just started 01/18/16  . Peripheral neuropathy    takes Lyrica daily  . Personal history of chemotherapy    2013  . Personal history of radiation therapy    2013  . Pneumonia    hx of-2015  . Pseudoarthrosis of lumbar spine   . Seasonal allergies    takes Singulair nightly  . SOB (shortness of breath) on exertion    rarely with exertion  . 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 Alphonzo Grieve)   BP 111/77   Pulse 97   Temp 98.2 F (36.8 C)   Ht 5\' 3"  (1.6 m)   Wt 299 lb 12.8 oz (136 kg)   SpO2 93%   BMI 53.11 kg/m   Opioid Risk Score:   Fall Risk Score:  `1  Depression screen PHQ 2/9  Depression screen [facility] 2/9 08/04/2018 06/06/2018 04/04/2018 01/13/2018 12/13/2017 06/21/2017 03/07/2017  Decreased Interest 0 0 0 3 3 1 2   Down, Depressed, Hopeless 0 0 0 3 3 1 2   PHQ - 2 Score 0 0 0 6 6 2 4   Altered sleeping - - - - - - 0  Tired, decreased energy - - - - - - 3  Change in appetite - - - - - - 3  Feeling bad or failure about yourself  - - - - - - 2  Trouble concentrating - - - - - - 3  Moving slowly or fidgety/restless - - - - - - 3  Suicidal thoughts - - - - - - 0  PHQ-9 Score - - - - - - 18  Difficult doing work/chores - - -  - - - Extremely dIfficult  Some recent data might be hidden    Review of Systems  Musculoskeletal: Positive for gait problem.  Neurological: Positive for numbness.       Spasms  Psychiatric/Behavioral: The patient is nervous/anxious.   All other systems reviewed and are negative.      Objective:   Physical Exam Vitals and nursing note reviewed.  Constitutional:      Appearance: Normal appearance.  Cardiovascular:     Rate and Rhythm: Normal rate and regular rhythm.     Pulses: Normal pulses.     Heart sounds: Normal heart sounds.  Pulmonary:     Effort: Pulmonary effort is normal.     Breath sounds: Normal breath sounds.  Musculoskeletal:     Cervical back: Normal range of motion and neck supple.     Comments: Normal Muscle Bulk and Muscle Testing Reveals:  Upper Extremities: Full ROM and Muscle Strength 5/5 Bilateral AC Joint Tenderness Lumbar Paraspinal Tenderness: L-3-L-5 Lower Extremities: Full ROM and Muscle Strength 5/5 Right Lower Extremity Flexion Produces Pain into her Right Patella Arises from Table Slowly  Narrow Based Gait   Skin:    General: Skin is warm and dry.  Neurological:     Mental Status: She is alert and oriented to person, place, and time.  Psychiatric:        Mood and Affect: Mood normal.        Behavior: Behavior normal.           Assessment & Plan:  1. Chronic Sacroiliac Joint Pain: S/PBilateral Sacroilliac Injection with Dr. Berenice Bouton 06/23/2019. With good relief noted. 10/08/2019. 2.. Lumbar  Postlaminectomy/ S/P Lumbar Fusion:Spinal Stenosis:S/PPosterior Lateral Fusion L-4-L-5 removal and replacement of hardware, Laminectomy L4-L5 by Dr. Ronnald Ramp on12/19/2019. Continue HEP as Tolerated and Continue current medication regimen. 10/08/2019 Refilled:Hydrocodone7.5/325 mg one tablet every8hours as needed#90. Continue to Monitor. 10/08/2019 3. Lumbar Radiculitis: Continue current medication regime withGabapentin.She reports she is  awaiting a scheduled appointment with Dr Maryjean Ka for spinal stimulator.10/08/2019 4. Fibromyalgia:ContinueHEP as Tolerated.Continue Current Medication Regimen withGabapentin.10/08/2019 5.LeftGreater Trochanter Bursitis:Continue to Alternate Ice and Heat Therapy.10/08/2019. 6. Muscle Spasm:Continue:Flexeril.Continue to Monitor.10/08/2019. 7. Chronic Pain Syndrome: Continue Current medication regimen and HEP as Tolerated. Continue to Monitor.10/08/2019. 8. Bilateral OA of Bilateral Knees: Orthopedist following.Continue to Monitor.10/08/2019. 9. Bilateral Hand Pain:No complaints today.Continue to Monitor/ ? OA: Continue to Monitor.10/08/2019 10. Cervicalgia:No complaints Today.Continue to alternate Heat/Ice Therapy. Continue HEP as Tolerated.10/08/2019. 11. Chronic Bilateral Shoulders:Nocomplaints today.Continue to alternate Ice/ Heat therapy and HEP as Tolerated.10/08/2019. 12. Bilateral Ankle Pain: Podiatry Following:No complaints today.Continue to Monitor.10/08/2019  F/U in 1 month  42minutes of face to face patient care time was spent during this visit. All questions were encouraged and answered.

## 2019-10-11 ENCOUNTER — Encounter: Payer: Self-pay | Admitting: Registered Nurse

## 2019-10-26 ENCOUNTER — Telehealth: Payer: Self-pay | Admitting: Podiatry

## 2019-10-26 NOTE — Telephone Encounter (Signed)
Pt left message Friday asking if we received paperwork from doctor for diabetic shoes.   Returned pts call and we did receive paperwork but there was no dates on any of the pages. PT states she was seen 5.5.2021 I have refaxed it with a note asking for dates.

## 2019-11-10 ENCOUNTER — Encounter: Payer: Self-pay | Admitting: Registered Nurse

## 2019-11-10 ENCOUNTER — Other Ambulatory Visit: Payer: Self-pay

## 2019-11-10 ENCOUNTER — Encounter: Payer: Medicare HMO | Attending: Physical Medicine & Rehabilitation | Admitting: Registered Nurse

## 2019-11-10 ENCOUNTER — Other Ambulatory Visit: Payer: Self-pay | Admitting: Physical Medicine & Rehabilitation

## 2019-11-10 VITALS — BP 114/78 | HR 85 | Temp 97.3°F | Ht 63.0 in | Wt 294.2 lb

## 2019-11-10 DIAGNOSIS — G8918 Other acute postprocedural pain: Secondary | ICD-10-CM | POA: Insufficient documentation

## 2019-11-10 DIAGNOSIS — G4733 Obstructive sleep apnea (adult) (pediatric): Secondary | ICD-10-CM | POA: Diagnosis not present

## 2019-11-10 DIAGNOSIS — M47817 Spondylosis without myelopathy or radiculopathy, lumbosacral region: Secondary | ICD-10-CM | POA: Diagnosis not present

## 2019-11-10 DIAGNOSIS — Z853 Personal history of malignant neoplasm of breast: Secondary | ICD-10-CM | POA: Diagnosis not present

## 2019-11-10 DIAGNOSIS — Z981 Arthrodesis status: Secondary | ICD-10-CM

## 2019-11-10 DIAGNOSIS — M545 Low back pain: Secondary | ICD-10-CM | POA: Insufficient documentation

## 2019-11-10 DIAGNOSIS — G8929 Other chronic pain: Secondary | ICD-10-CM | POA: Diagnosis present

## 2019-11-10 DIAGNOSIS — Z5181 Encounter for therapeutic drug level monitoring: Secondary | ICD-10-CM | POA: Diagnosis present

## 2019-11-10 DIAGNOSIS — M5416 Radiculopathy, lumbar region: Secondary | ICD-10-CM | POA: Diagnosis not present

## 2019-11-10 DIAGNOSIS — M48061 Spinal stenosis, lumbar region without neurogenic claudication: Secondary | ICD-10-CM | POA: Insufficient documentation

## 2019-11-10 DIAGNOSIS — G894 Chronic pain syndrome: Secondary | ICD-10-CM | POA: Insufficient documentation

## 2019-11-10 DIAGNOSIS — F329 Major depressive disorder, single episode, unspecified: Secondary | ICD-10-CM | POA: Insufficient documentation

## 2019-11-10 DIAGNOSIS — M961 Postlaminectomy syndrome, not elsewhere classified: Secondary | ICD-10-CM | POA: Diagnosis present

## 2019-11-10 DIAGNOSIS — Z79891 Long term (current) use of opiate analgesic: Secondary | ICD-10-CM | POA: Insufficient documentation

## 2019-11-10 DIAGNOSIS — K219 Gastro-esophageal reflux disease without esophagitis: Secondary | ICD-10-CM | POA: Insufficient documentation

## 2019-11-10 DIAGNOSIS — Z9889 Other specified postprocedural states: Secondary | ICD-10-CM | POA: Diagnosis not present

## 2019-11-10 DIAGNOSIS — M797 Fibromyalgia: Secondary | ICD-10-CM | POA: Diagnosis not present

## 2019-11-10 DIAGNOSIS — M1711 Unilateral primary osteoarthritis, right knee: Secondary | ICD-10-CM

## 2019-11-10 MED ORDER — HYDROCODONE-ACETAMINOPHEN 7.5-325 MG PO TABS: ORAL_TABLET | ORAL | 0 refills | Status: DC

## 2019-11-10 NOTE — Progress Notes (Signed)
Subjective:    Patient ID: Kelsey Wilson, female    DOB: September 11, 1960, 59 y.o.   MRN: [id number]  HPI: Kelsey Wilson is a 59 y.o. female who returns for follow up appointment for chronic pain and medication refill. She states her pain is located in her lower back pain radiating into her left hip and right knee pain.She rates her pain 7. Her current exercise regime is walking and performing stretching exercises.  Kelsey Wilson Morphine equivalent is 22.50 MME.    Last Oral Swab was Performed on 09/11/2019, it was consistent.    Pain Inventory Average Pain 7 Pain Right Now 7 My pain is aching  In the last 24 hours, has pain interfered with the following? General activity 1 Relation with others 1 Enjoyment of life 1 What TIME of day is your pain at its worst? All day Sleep (in general) Fair  Pain is worse with: walking, bending, standing and some activites Pain improves with: rest and therapy/exercise Relief from Meds: 4  Mobility walk without assistance use a cane ability to climb steps?  yes do you drive?  yes  Function disabled: date disabled unknown date I need assistance with the following:  meal prep, household duties and shopping  Neuro/Psych numbness spasms anxiety  Prior Studies Any changes since last visit?  no  Physicians involved in your care Any changes since last visit?  no   Family History  Problem Relation Age of Onset  . Hypertension Mother   . Diabetes Mother   . Hypertension Father   . Diabetes Father   . Cancer Paternal Grandmother        unknown  . Breast cancer Paternal Aunt   . Hypertension Sister   . Hyperlipidemia Brother   . Colon cancer Neg Hx    Social History   Socioeconomic History  . Marital status: Single    Spouse name: Not on file  . Number of children: 3  . Years of education: Not on file  . Highest education level: Not on file  Occupational History  . Not on file  Tobacco Use  . Smoking status: Never  Smoker  . Smokeless tobacco: Never Used  Substance and Sexual Activity  . Alcohol use: No    Alcohol/week: 0.0 standard drinks  . Drug use: No  . Sexual activity: Never    Birth control/protection: Post-menopausal, Surgical  Other Topics Concern  . Not on file  Social History Narrative  . Not on file   Social Determinants of Health   Financial Resource Strain:   . Difficulty of Paying Living Expenses:   Food Insecurity:   . Worried About Charity fundraiser in the Last Year:   . Arboriculturist in the Last Year:   Transportation Needs:   . Film/video editor (Medical):   Marland Kitchen Lack of Transportation (Non-Medical):   Physical Activity:   . Days of Exercise per Week:   . Minutes of Exercise per Session:   Stress:   . Feeling of Stress :   Social Connections:   . Frequency of Communication with Friends and Family:   . Frequency of Social Gatherings with Friends and Family:   . Attends Religious Services:   . Active Member of Clubs or Organizations:   . Attends Archivist Meetings:   Marland Kitchen Marital Status:    Past Surgical History:  Procedure Laterality Date  . ABDOMINAL HYSTERECTOMY     still has ovaries  . APPENDECTOMY    .  AXILLARY LYMPH NODE DISSECTION  11/28/2011   Procedure: AXILLARY LYMPH NODE DISSECTION;  Surgeon: Edward Jolly, MD;  Location: [place];  Service: General;  Laterality: Left;  . BREAST LUMPECTOMY Left 11/28/2011   Malignant  . BREAST SURGERY Left 2013  . CARPAL TUNNEL RELEASE     Bilateral  . CESAREAN SECTION     pt. has had 3  . CHOLECYSTECTOMY    . COLONOSCOPY    . EYE SURGERY Bilateral    cataract removal  . KNEE SURGERY     Left Knee  . LAMINECTOMY WITH POSTERIOR LATERAL ARTHRODESIS LEVEL 1 N/A 11/29/2017   Procedure: Posterior Lateral Fusion - Lumbar Four-Lumbar Five, removal and replacement of hardware, Laminectomy - Lumbar Four-Lumbar Five;  Surgeon: Eustace Moore, MD;  Location: [facility] OR;  Service: Neurosurgery;  Laterality: N/A;   . LAMINECTOMY WITH POSTERIOR LATERAL ARTHRODESIS LEVEL 2 N/A 06/07/2017   Procedure: Posterior Lateral Fusion Lumbar Three-Four and Transforaminal Interbody Fusion Lumbar Four-Five with Segmental  Pedicle Screw Fixation;  Surgeon: Eustace Moore, MD;  Location: [place];  Service: Neurosurgery;  Laterality: N/A;  Posterior Lateral Fusion Lumbar Three-Four and Transforaminal Interbody Fusion Lumbar Four-Five with Segmental  Pedicle Screw Fixation   . LUMBAR FUSION  06/07/2017   POST  . LUMBAR LAMINECTOMY/DECOMPRESSION MICRODISCECTOMY Bilateral 01/27/2016   Procedure: Laminectomy and Foraminotomy - Lumbar four -Lumbar five - bilateral- on-lay noninstrumented fusion;  Surgeon: Eustace Moore, MD;  Location: [place] [facility];  Service: Neurosurgery;  Laterality: Bilateral;  . MULTIPLE EXTRACTIONS WITH ALVEOLOPLASTY N/A 05/11/2016   Procedure: EXTRACTION OF TEETH EIGHTEEN, TWENTY AND TWENTY- NINE;  REMOVAL OF MANDIBULAR TORUS AND EXOSTOSIS;  Surgeon: Diona Browner, DDS;  Location: [place];  Service: Oral Surgery;  Laterality: N/A;  . PORTACATH PLACEMENT  06/18/2011   Procedure: INSERTION PORT-A-CATH;  Surgeon: Edward Jolly, MD;  Location: [place];  Service: General;  Laterality: Right;  right subclavian  . removal portacath  2014  . spur     Apex spur on both big toes  . TOE SURGERY Bilateral   . TONSILLECTOMY    . TOTAL KNEE ARTHROPLASTY Left 09/13/2014  . TOTAL KNEE ARTHROPLASTY Left 09/13/2014   Procedure: LEFT TOTAL KNEE ARTHROPLASTY;  Surgeon: Rod Can, MD;  Location: [place];  Service: Orthopedics;  Laterality: Left;  . TOTAL KNEE ARTHROPLASTY Right 03/10/2015   Procedure: RIGHT TOTAL KNEE ARTHROPLASTY;  Surgeon: Rod Can, MD;  Location: [facility];  Service: Orthopedics;  Laterality: Right;   Past Medical History:  Diagnosis Date  . Anemia   . Anxiety   . Arthritis   . Breast cancer ([place]) 2010   T3N1 invasive ductal carcinoma left breast.Takes Arimidex daily  . Bursitis    . Carpal tunnel syndrome   . Chronic back pain    stenosis  . Constipation    takes Colace daily  . Depression    takes Benzotropine daily  . Diverticulitis of colon   . Dyspnea    daily when walking for over 1 yr.  . Fibromyalgia 08/2012  . GERD (gastroesophageal reflux disease)    takes Dexilant daily  . Hemorrhoid   . History of blood transfusion    no abnormal reaction noted  . History of colon polyps    benign  . History of shingles   . Joint pain   . Joint swelling   . Night muscle spasms    takes Flexeril nightly as needed  . Nocturia   . OSA (  obstructive sleep apnea)   . OSA on CPAP   . Peripheral edema    takes Furosemide.Just started 01/18/16  . Peripheral neuropathy    takes Lyrica daily  . Personal history of chemotherapy    2013  . Personal history of radiation therapy    2013  . Pneumonia    hx of-2015  . Pseudoarthrosis of lumbar spine   . Seasonal allergies    takes Singulair nightly  . SOB (shortness of breath) on exertion    rarely with exertion  . 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 Alphonzo Grieve)   BP 114/78   Pulse 85   Temp (!) 97.3 F (36.3 C)   Ht 5\' 3"  (1.6 m)   Wt 294 lb 3.2 oz (133.4 kg)   SpO2 93%   BMI 52.12 kg/m   Opioid Risk Score:   Fall Risk Score:  `1  Depression screen PHQ 2/9  Depression screen [facility] 2/9 08/04/2018 06/06/2018 04/04/2018 01/13/2018 12/13/2017 06/21/2017 03/07/2017  Decreased Interest 0 0 0 3 3 1 2   Down, Depressed, Hopeless 0 0 0 3 3 1 2   PHQ - 2 Score 0 0 0 6 6 2 4   Altered sleeping - - - - - - 0  Tired, decreased energy - - - - - - 3  Change in appetite - - - - - - 3  Feeling bad or failure about yourself  - - - - - - 2  Trouble concentrating - - - - - - 3  Moving slowly or fidgety/restless - - - - - - 3  Suicidal thoughts - - - - - - 0  PHQ-9 Score - - - - - - 18  Difficult doing  work/chores - - - - - - Extremely dIfficult  Some recent data might be hidden   Review of Systems  Musculoskeletal: Positive for gait problem.  Neurological: Positive for numbness.       Spasms  Psychiatric/Behavioral: The patient is nervous/anxious.   All other systems reviewed and are negative.      Objective:   Physical Exam Vitals and nursing note reviewed.  Constitutional:      Appearance: Normal appearance.  Cardiovascular:     Rate and Rhythm: Normal rate and regular rhythm.     Pulses: Normal pulses.     Heart sounds: Normal heart sounds.  Pulmonary:     Effort: Pulmonary effort is normal.     Breath sounds: Normal breath sounds.  Musculoskeletal:     Cervical back: Normal range of motion and neck supple.     Comments: Normal Muscle Bulk and Muscle Testing Reveals:  Upper Extremities: Full ROM and Muscle Strength 5/5 Left AC Joint Tenderness Lumbar Paraspinal Tenderness: L-3-L-5 Left Greater Trochanteric Tenderness Lower Extremities: Full ROM and Muscle Strength 5/5 Arises from Table Slowly using cane for support Antalgic Gait   Skin:    General: Skin is warm and dry.  Neurological:     Mental Status: She is alert and oriented to person, place, and time.  Psychiatric:        Mood and Affect: Mood normal.        Behavior: Behavior normal.           Assessment & Plan:  1. Chronic Sacroiliac Joint Pain: S/PBilateral Sacroilliac Injection with Dr. Berenice Bouton 06/23/2019. With good relief noted. 11/10/2019. 2.. Lumbar Postlaminectomy/ S/P Lumbar Fusion:Spinal  Stenosis:S/PPosterior Lateral Fusion L-4-L-5 removal and replacement of hardware, Laminectomy L4-L5 by Dr. Ronnald Ramp on12/19/2019. Continue HEP as Tolerated and Continue current medication regimen. 11/10/2019 Refilled:Hydrocodone7.5/325 mg one tablet every8hours as needed#90. Continue to Monitor. 11/10/2019 3. Lumbar Radiculitis: Continue current medication regime withGabapentin.She reports she is  awaiting a scheduled appointment with Dr Maryjean Ka for spinal stimulator.11/10/2019 4. Fibromyalgia:ContinueHEP as Tolerated.Continue Current Medication Regimen withGabapentin.11/10/2019 5.LeftGreater Trochanter Bursitis:Continue to Alternate Ice and Heat Therapy.11/10/2019. 6. Muscle Spasm:Continue:Flexeril.Continue to Monitor.11/10/2019. 7. Chronic Pain Syndrome: Continue Current medication regimen and HEP as Tolerated. Continue to Monitor.11/10/2019. 8. Bilateral OA of Bilateral Knees: Orthopedist following.Continue to Monitor.11/10/2019. 9. Bilateral Hand Pain:No complaints today.Continue to Monitor/ ? OA: Continue to Monitor.11/10/2019 10. Cervicalgia:No complaints Today.Continue to alternate Heat/Ice Therapy. Continue HEP as Tolerated.11/10/2019. 11. Chronic Bilateral Shoulders:Nocomplaints today.Continue to alternate Ice/ Heat therapy and HEP as Tolerated.11/10/2019. 12. Bilateral Ankle Pain: Podiatry Following:No complaints today.Continue to Monitor.11/10/2019  F/U in 1 month  78minutes of face to face patient care time was spent during this visit. All questions were encouraged and answered.

## 2019-11-10 NOTE — Progress Notes (Deleted)
   Subjective:    Patient ID: Kelsey Wilson, female    DOB: [birth year], 59 y.o.   MRN: [id number]  HPI    Review of Systems  Psychiatric/Behavioral:       Depression  All other systems reviewed and are negative.      Objective:   Physical Exam        Assessment & Plan:

## 2019-11-19 ENCOUNTER — Telehealth: Payer: Self-pay | Admitting: Podiatry

## 2019-11-19 NOTE — Telephone Encounter (Signed)
Pt left message checking to see if shoes have arrived.  I returned call and it looks like they should be shipping anytime but I would call pt when they come in to get her scheduled to pick them up.

## 2019-11-23 ENCOUNTER — Encounter: Payer: Self-pay | Admitting: Internal Medicine

## 2019-12-01 ENCOUNTER — Other Ambulatory Visit: Payer: Self-pay | Admitting: Nurse Practitioner

## 2019-12-01 DIAGNOSIS — N63 Unspecified lump in unspecified breast: Secondary | ICD-10-CM

## 2019-12-08 ENCOUNTER — Telehealth: Payer: Self-pay | Admitting: Podiatry

## 2019-12-08 NOTE — Telephone Encounter (Signed)
Pt left message last week checking on diabetic shoes status.  I returned call and left message the shoe she ordered is on back order but we can get her the same shoe in a gray and purple is interested but to call to discuss further.

## 2019-12-10 ENCOUNTER — Encounter: Payer: Medicare HMO | Attending: Physical Medicine & Rehabilitation | Admitting: Registered Nurse

## 2019-12-10 ENCOUNTER — Other Ambulatory Visit: Payer: Self-pay

## 2019-12-10 ENCOUNTER — Encounter: Payer: Self-pay | Admitting: Registered Nurse

## 2019-12-10 VITALS — BP 109/73 | HR 94 | Ht 63.0 in | Wt 298.0 lb

## 2019-12-10 DIAGNOSIS — G894 Chronic pain syndrome: Secondary | ICD-10-CM | POA: Diagnosis present

## 2019-12-10 DIAGNOSIS — M48061 Spinal stenosis, lumbar region without neurogenic claudication: Secondary | ICD-10-CM | POA: Diagnosis not present

## 2019-12-10 DIAGNOSIS — F329 Major depressive disorder, single episode, unspecified: Secondary | ICD-10-CM | POA: Insufficient documentation

## 2019-12-10 DIAGNOSIS — M961 Postlaminectomy syndrome, not elsewhere classified: Secondary | ICD-10-CM

## 2019-12-10 DIAGNOSIS — Z5181 Encounter for therapeutic drug level monitoring: Secondary | ICD-10-CM | POA: Diagnosis present

## 2019-12-10 DIAGNOSIS — G4733 Obstructive sleep apnea (adult) (pediatric): Secondary | ICD-10-CM | POA: Insufficient documentation

## 2019-12-10 DIAGNOSIS — Z981 Arthrodesis status: Secondary | ICD-10-CM

## 2019-12-10 DIAGNOSIS — G8918 Other acute postprocedural pain: Secondary | ICD-10-CM | POA: Insufficient documentation

## 2019-12-10 DIAGNOSIS — M797 Fibromyalgia: Secondary | ICD-10-CM

## 2019-12-10 DIAGNOSIS — Z853 Personal history of malignant neoplasm of breast: Secondary | ICD-10-CM | POA: Insufficient documentation

## 2019-12-10 DIAGNOSIS — G8929 Other chronic pain: Secondary | ICD-10-CM | POA: Insufficient documentation

## 2019-12-10 DIAGNOSIS — M545 Low back pain: Secondary | ICD-10-CM | POA: Diagnosis present

## 2019-12-10 DIAGNOSIS — Z79891 Long term (current) use of opiate analgesic: Secondary | ICD-10-CM | POA: Diagnosis present

## 2019-12-10 DIAGNOSIS — M7062 Trochanteric bursitis, left hip: Secondary | ICD-10-CM

## 2019-12-10 DIAGNOSIS — K219 Gastro-esophageal reflux disease without esophagitis: Secondary | ICD-10-CM | POA: Insufficient documentation

## 2019-12-10 DIAGNOSIS — Z9889 Other specified postprocedural states: Secondary | ICD-10-CM | POA: Diagnosis not present

## 2019-12-10 DIAGNOSIS — M47817 Spondylosis without myelopathy or radiculopathy, lumbosacral region: Secondary | ICD-10-CM

## 2019-12-10 MED ORDER — TRAMADOL HCL 50 MG PO TABS: 50.0000 mg | ORAL_TABLET | Freq: Three times a day (TID) | ORAL | 1 refills | Status: DC | PRN

## 2019-12-10 NOTE — Patient Instructions (Addendum)
Kelsey Wilson call or send a My-Chart message in two weeks regarding medication change.   [phone]

## 2019-12-10 NOTE — Progress Notes (Signed)
Subjective:    Patient ID: Kelsey Wilson, female    DOB: [birth year], 59 y.o.   MRN: [id number]  HPI: Kelsey Wilson is a 59 y.o. female who returns for follow up appointment for chronic pain and medication refill. She states her pain is located in her lower back and left hip. She rates her  Pain 7. Her current exercise regime is walking and performing stretching exercises.  Ms. Digiulio asked if she could resume her Tramadol, PMP was reviewed , she was last prescribed Tramadol was in 2018 prior to her post-laminectomy. She was prescribed Tramadol today and Hydrocodone was discontinued. She was instructed to send a My-Chart message in two weeks, she verbalizes understanding.   Ms. Hazan Morphine equivalent is 22.50  MME. She is also prescribed Alprazolam by Selina Cooley. We have discussed the black box warning of using opioids and benzodiazepines. I highlighted the dangers of using these drugs together and discussed the adverse events including respiratory suppression, overdose, cognitive impairment and importance of compliance with current regimen. We will continue to monitor and adjust as indicated.    Last Oral Swab was Performed on 09/11/2019, it was consistent.    Pain Inventory Average Pain 7 Pain Right Now 7 My pain is constant  In the last 24 hours, has pain interfered with the following? General activity 2 Relation with others 2 Enjoyment of life 2 What TIME of day is your pain at its worst? all Sleep (in general) Fair  Pain is worse with: walking, bending, sitting and standing Pain improves with: rest, heat/ice, therapy/exercise and medication Relief from Meds: ?  Mobility walk without assistance ability to climb steps?  yes do you drive?  yes Do you have any goals in this area?  no  Function disabled: date disabled .  Neuro/Psych weakness numbness tingling spasms confusion anxiety  Prior Studies Any changes since last visit?  no  Physicians  involved in your care Any changes since last visit?  no   Family History  Problem Relation Age of Onset  . Hypertension Mother   . Diabetes Mother   . Hypertension Father   . Diabetes Father   . Cancer Paternal Grandmother        unknown  . Breast cancer Paternal Aunt   . Hypertension Sister   . Hyperlipidemia Brother   . Colon cancer Neg Hx    Social History   Socioeconomic History  . Marital status: Single    Spouse name: Not on file  . Number of children: 3  . Years of education: Not on file  . Highest education level: Not on file  Occupational History  . Not on file  Tobacco Use  . Smoking status: Never Smoker  . Smokeless tobacco: Never Used  Vaping Use  . Vaping Use: Never used  Substance and Sexual Activity  . Alcohol use: No    Alcohol/week: 0.0 standard drinks  . Drug use: No  . Sexual activity: Never    Birth control/protection: Post-menopausal, Surgical  Other Topics Concern  . Not on file  Social History Narrative  . Not on file   Social Determinants of Health   Financial Resource Strain:   . Difficulty of Paying Living Expenses:   Food Insecurity:   . Worried About Charity fundraiser in the Last Year:   . Arboriculturist in the Last Year:   Transportation Needs:   . Film/video editor (Medical):   Marland Kitchen Lack of Transportation (Non-Medical):  Physical Activity:   . Days of Exercise per Week:   . Minutes of Exercise per Session:   Stress:   . Feeling of Stress :   Social Connections:   . Frequency of Communication with Friends and Family:   . Frequency of Social Gatherings with Friends and Family:   . Attends Religious Services:   . Active Member of Clubs or Organizations:   . Attends Archivist Meetings:   Marland Kitchen Marital Status:    Past Surgical History:  Procedure Laterality Date  . ABDOMINAL HYSTERECTOMY     still has ovaries  . APPENDECTOMY    . AXILLARY LYMPH NODE DISSECTION  11/28/2011   Procedure: AXILLARY LYMPH NODE  DISSECTION;  Surgeon: Edward Jolly, MD;  Location: [place];  Service: General;  Laterality: Left;  . BREAST LUMPECTOMY Left 11/28/2011   Malignant  . BREAST SURGERY Left 2013  . CARPAL TUNNEL RELEASE     Bilateral  . CESAREAN SECTION     pt. has had 3  . CHOLECYSTECTOMY    . COLONOSCOPY    . EYE SURGERY Bilateral    cataract removal  . KNEE SURGERY     Left Knee  . LAMINECTOMY WITH POSTERIOR LATERAL ARTHRODESIS LEVEL 1 N/A 11/29/2017   Procedure: Posterior Lateral Fusion - Lumbar Four-Lumbar Five, removal and replacement of hardware, Laminectomy - Lumbar Four-Lumbar Five;  Surgeon: Eustace Moore, MD;  Location: [facility] OR;  Service: Neurosurgery;  Laterality: N/A;  . LAMINECTOMY WITH POSTERIOR LATERAL ARTHRODESIS LEVEL 2 N/A 06/07/2017   Procedure: Posterior Lateral Fusion Lumbar Three-Four and Transforaminal Interbody Fusion Lumbar Four-Five with Segmental  Pedicle Screw Fixation;  Surgeon: Eustace Moore, MD;  Location: [place];  Service: Neurosurgery;  Laterality: N/A;  Posterior Lateral Fusion Lumbar Three-Four and Transforaminal Interbody Fusion Lumbar Four-Five with Segmental  Pedicle Screw Fixation   . LUMBAR FUSION  06/07/2017   POST  . LUMBAR LAMINECTOMY/DECOMPRESSION MICRODISCECTOMY Bilateral 01/27/2016   Procedure: Laminectomy and Foraminotomy - Lumbar four -Lumbar five - bilateral- on-lay noninstrumented fusion;  Surgeon: Eustace Moore, MD;  Location: [place] [facility];  Service: Neurosurgery;  Laterality: Bilateral;  . MULTIPLE EXTRACTIONS WITH ALVEOLOPLASTY N/A 05/11/2016   Procedure: EXTRACTION OF TEETH EIGHTEEN, TWENTY AND TWENTY- NINE;  REMOVAL OF MANDIBULAR TORUS AND EXOSTOSIS;  Surgeon: Diona Browner, DDS;  Location: [place];  Service: Oral Surgery;  Laterality: N/A;  . PORTACATH PLACEMENT  06/18/2011   Procedure: INSERTION PORT-A-CATH;  Surgeon: Edward Jolly, MD;  Location: [place];  Service: General;  Laterality: Right;  right subclavian  . removal portacath   2014  . spur     Apex spur on both big toes  . TOE SURGERY Bilateral   . TONSILLECTOMY    . TOTAL KNEE ARTHROPLASTY Left 09/13/2014  . TOTAL KNEE ARTHROPLASTY Left 09/13/2014   Procedure: LEFT TOTAL KNEE ARTHROPLASTY;  Surgeon: Rod Can, MD;  Location: [place];  Service: Orthopedics;  Laterality: Left;  . TOTAL KNEE ARTHROPLASTY Right 03/10/2015   Procedure: RIGHT TOTAL KNEE ARTHROPLASTY;  Surgeon: Rod Can, MD;  Location: [facility];  Service: Orthopedics;  Laterality: Right;   Past Medical History:  Diagnosis Date  . Anemia   . Anxiety   . Arthritis   . Breast cancer ([place]) 2010   T3N1 invasive ductal carcinoma left breast.Takes Arimidex daily  . Bursitis   . Carpal tunnel syndrome   . Chronic back pain    stenosis  . Constipation    takes  Colace daily  . Depression    takes Benzotropine daily  . Diverticulitis of colon   . Dyspnea    daily when walking for over 1 yr.  . Fibromyalgia 08/2012  . GERD (gastroesophageal reflux disease)    takes Dexilant daily  . Hemorrhoid   . History of blood transfusion    no abnormal reaction noted  . History of colon polyps    benign  . History of shingles   . Joint pain   . Joint swelling   . Night muscle spasms    takes Flexeril nightly as needed  . Nocturia   . OSA (obstructive sleep apnea)   . OSA on CPAP   . Peripheral edema    takes Furosemide.Just started 01/18/16  . Peripheral neuropathy    takes Lyrica daily  . Personal history of chemotherapy    2013  . Personal history of radiation therapy    2013  . Pneumonia    hx of-2015  . Pseudoarthrosis of lumbar spine   . Seasonal allergies    takes Singulair nightly  . SOB (shortness of breath) on exertion    rarely with exertion  . 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 Alphonzo Grieve)   BP 109/73   Pulse 94   Ht 5\' 3"  (1.6 m)   Wt 298 lb (135.2 kg)    SpO2 96%   BMI 52.79 kg/m   Opioid Risk Score:   Fall Risk Score:  `1  Depression screen PHQ 2/9  Depression screen [facility] 2/9 08/04/2018 06/06/2018 04/04/2018 01/13/2018 12/13/2017 06/21/2017 03/07/2017  Decreased Interest 0 0 0 3 3 1 2   Down, Depressed, Hopeless 0 0 0 3 3 1 2   PHQ - 2 Score 0 0 0 6 6 2 4   Altered sleeping - - - - - - 0  Tired, decreased energy - - - - - - 3  Change in appetite - - - - - - 3  Feeling bad or failure about yourself  - - - - - - 2  Trouble concentrating - - - - - - 3  Moving slowly or fidgety/restless - - - - - - 3  Suicidal thoughts - - - - - - 0  PHQ-9 Score - - - - - - 18  Difficult doing work/chores - - - - - - Extremely dIfficult  Some recent data might be hidden    Review of Systems  HENT: Negative.   Eyes: Negative.   Respiratory: Negative.   Cardiovascular: Negative.   Gastrointestinal: Negative.   Endocrine: Negative.   Genitourinary: Negative.   Musculoskeletal: Positive for arthralgias and back pain.       Spasms   Skin: Negative.   Allergic/Immunologic: Negative.   Neurological: Positive for weakness and numbness.       Tingling   Psychiatric/Behavioral: Positive for dysphoric mood. The patient is nervous/anxious.   All other systems reviewed and are negative.      Objective:   Physical Exam Vitals and nursing note reviewed.  Constitutional:      Appearance: Normal appearance.  Cardiovascular:     Rate and Rhythm: Normal rate and regular rhythm.     Pulses: Normal pulses.     Heart sounds: Normal heart sounds.  Pulmonary:     Effort: Pulmonary effort is normal.     Breath sounds: Normal breath sounds.  Musculoskeletal:  Cervical back: Normal range of motion and neck supple.     Comments: Normal Muscle Bulk and Muscle Testing Reveals:  Upper Extremities: Decreased ROM 90 Degrees  and Muscle Strength 5/5 Bilateral AC Joint Tenderness Thoracic and Lumbar Hypersensitivity Lower Extremities: Full ROM and Muscle Strength  5/5 Arises from Table Slowly Narrow Based  Gait   Skin:    General: Skin is warm and dry.  Neurological:     Mental Status: She is alert and oriented to person, place, and time.  Psychiatric:        Mood and Affect: Mood normal.        Behavior: Behavior normal.           Assessment & Plan:  1. Chronic Sacroiliac Joint Pain: S/PBilateral Sacroilliac Injection with Dr. Berenice Bouton 06/23/2019. With good relief noted. 12/10/2019. 2.. Lumbar Postlaminectomy/ S/P Lumbar Fusion:Spinal Stenosis:S/PPosterior Lateral Fusion L-4-L-5 removal and replacement of hardware, Laminectomy L4-L5 by Dr. Ronnald Ramp on12/19/2019. Continue HEP as Tolerated and Continue current medicationregimen. 07/08/2021RX: Tramadol 50 mg one tablet every 8 hours as needed for pain. Discontinue. Hydrocodone7.5/325 mg.Continue to Monitor.12/10/2019 3. Lumbar Radiculitis: No complaints today. Continue current medication regime withGabapentin.She reports she is awaiting a scheduled appointment with Dr Maryjean Ka for spinal stimulator.12/10/2019 4. Fibromyalgia:ContinueHEP as Tolerated.Continue Current Medication Regimen withGabapentin.12/10/2019 5.LeftGreater Trochanter Bursitis:Continue to Alternate Ice and Heat Therapy.12/10/2019. 6. Muscle Spasm:Continue:Flexeril.Continue to Monitor.12/10/2019. 7. Chronic Pain Syndrome: Continue Current medication regimen and HEP as Tolerated. Continue to Monitor.12/10/2019. 8. Bilateral OA of Bilateral Knees: Orthopedist following.Continue to Monitor.12/10/2019. 9. Bilateral Hand Pain:No complaints today.Continue to Monitor/ ? OA: Continue to Monitor.12/10/2019 10. Cervicalgia:No complaints Today.Continue to alternate Heat/Ice Therapy. Continue HEP as Tolerated.12/10/2019. 11. Chronic Bilateral Shoulders:Nocomplaints today.Continue to alternate Ice/ Heat therapy and HEP as Tolerated.12/10/2019. 12. Bilateral Ankle Pain: Podiatry Following:No complaints  today.Continue to Monitor.12/10/2019  F/U in 1 month  32minutes of face to face patient care time was spent during this visit. All questions were encouraged and answered.

## 2019-12-15 ENCOUNTER — Ambulatory Visit
Admission: RE | Admit: 2019-12-15 | Discharge: 2019-12-15 | Disposition: A | Discharge status: Home / Self Care | Payer: Medicare HMO | Source: Ambulatory Visit | Attending: Nurse Practitioner | Admitting: Nurse Practitioner

## 2019-12-15 ENCOUNTER — Other Ambulatory Visit: Payer: Self-pay

## 2019-12-15 DIAGNOSIS — N63 Unspecified lump in unspecified breast: Secondary | ICD-10-CM

## 2019-12-15 IMAGING — MG MM DIGITAL DIAGNOSTIC UNILAT*L* W/ TOMO W/ CAD
6 series · 7 of 18 positions shown · non-contrast
Comparison: Previous exams.
COMPARISON: Previous exams.

Addendum:
CLINICAL DATA: 4-year-old female with a palpable/tender area of
concern near the lumpectomy site in the upper-outer left breast.
Patient does have a history of prior left breast cancer post
lumpectomy 3661.

EXAM:
DIGITAL DIAGNOSTIC UNILATERAL LEFT MAMMOGRAM WITH TOMO AND CAD;
ULTRASOUND LEFT BREAST LIMITED
CLINICAL DATA: 58-year-old female with a palpable/tender area of
RECOMMENDATION: 2. Recommend annual routine screening mammography,
due June 2020.
*** End of Addendum ***

[L XCCL synth-2D]
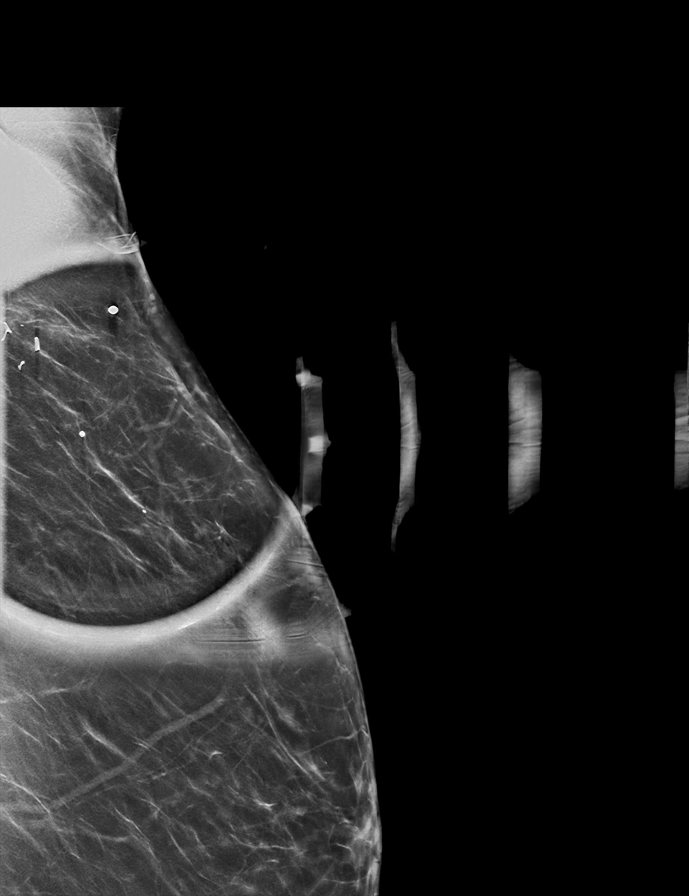

[L CC synth-2D]
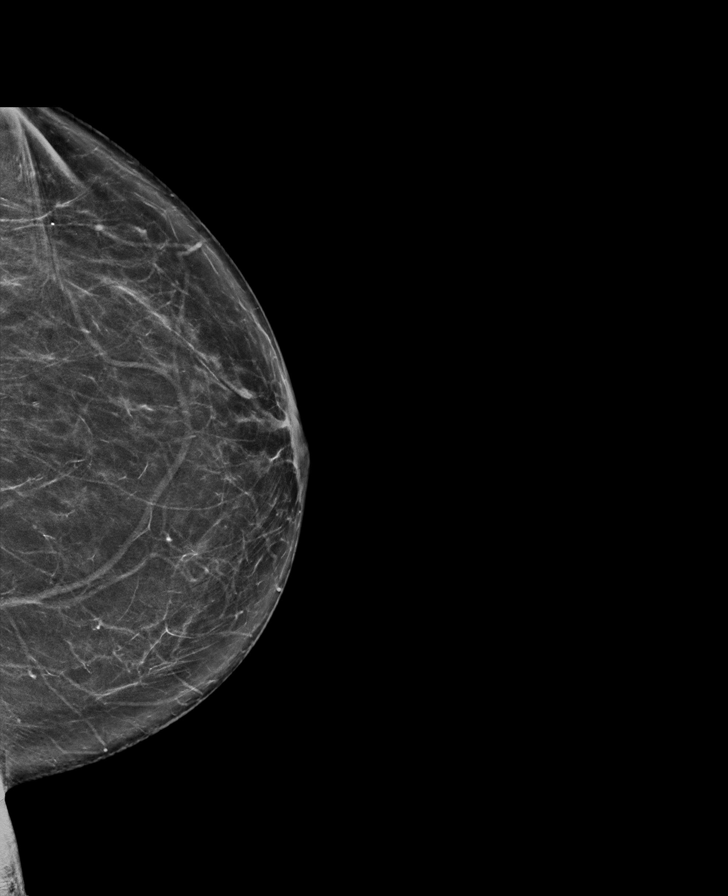

[L MLO synth-2D]
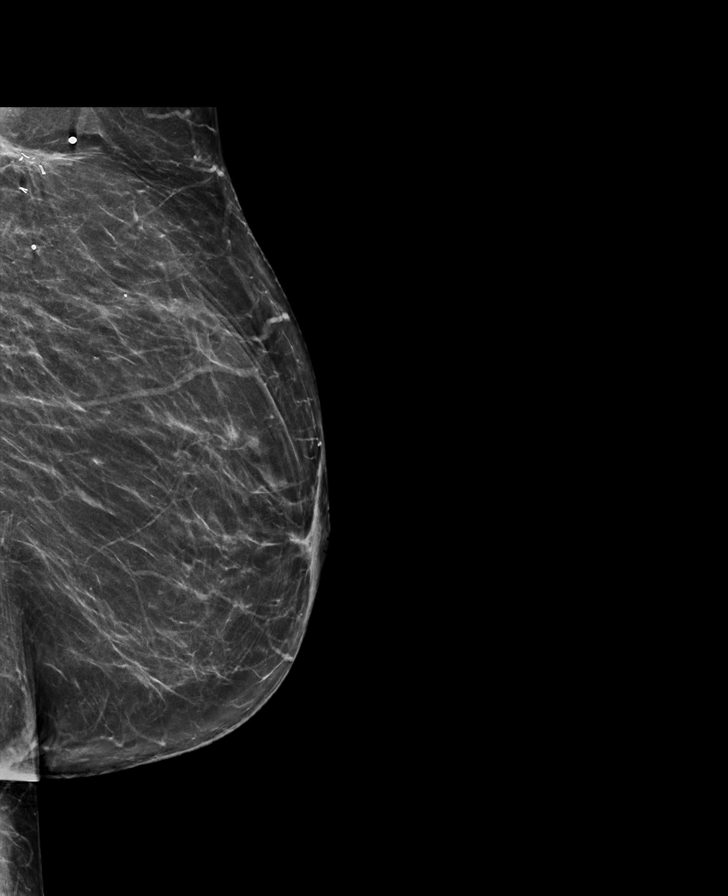

[L CC tomo · 2 of 72 frames shown]
[frame 24/72]
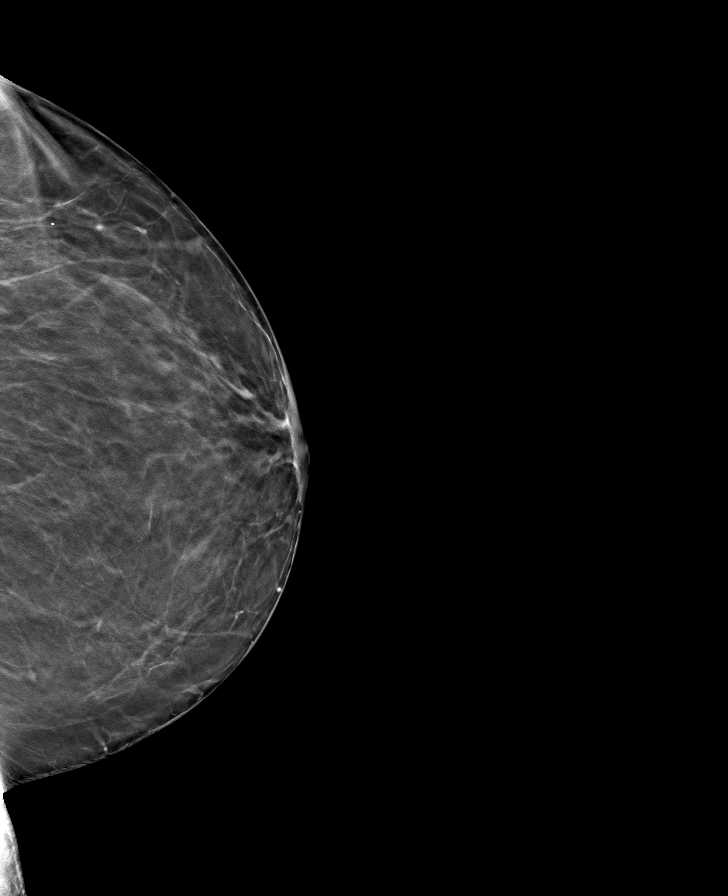
[frame 37/72]
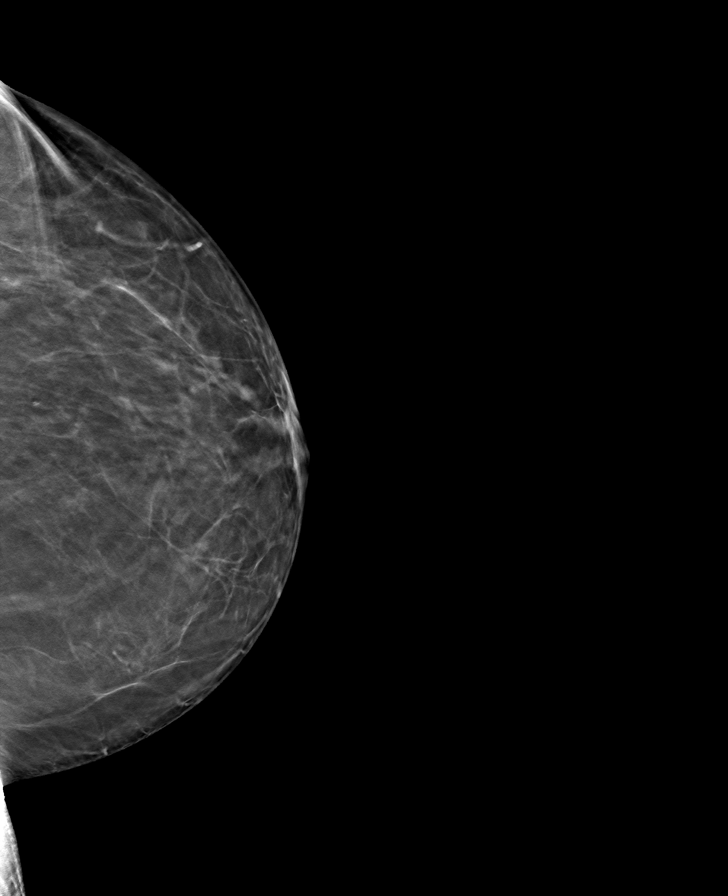

[L XCCL tomo · tomo slice 39/78.0]
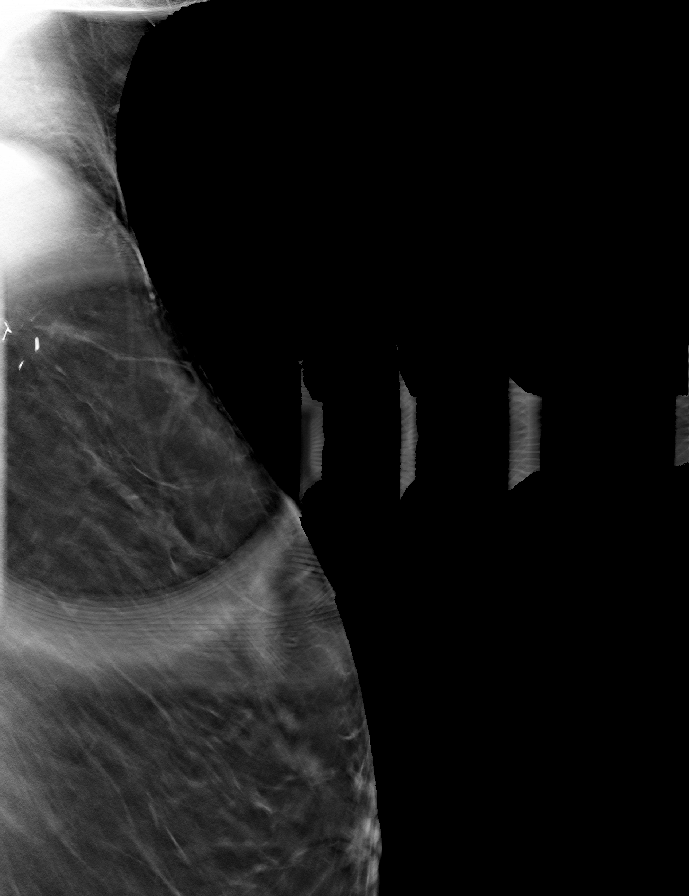

[L MLO tomo · tomo slice 37/73.0]
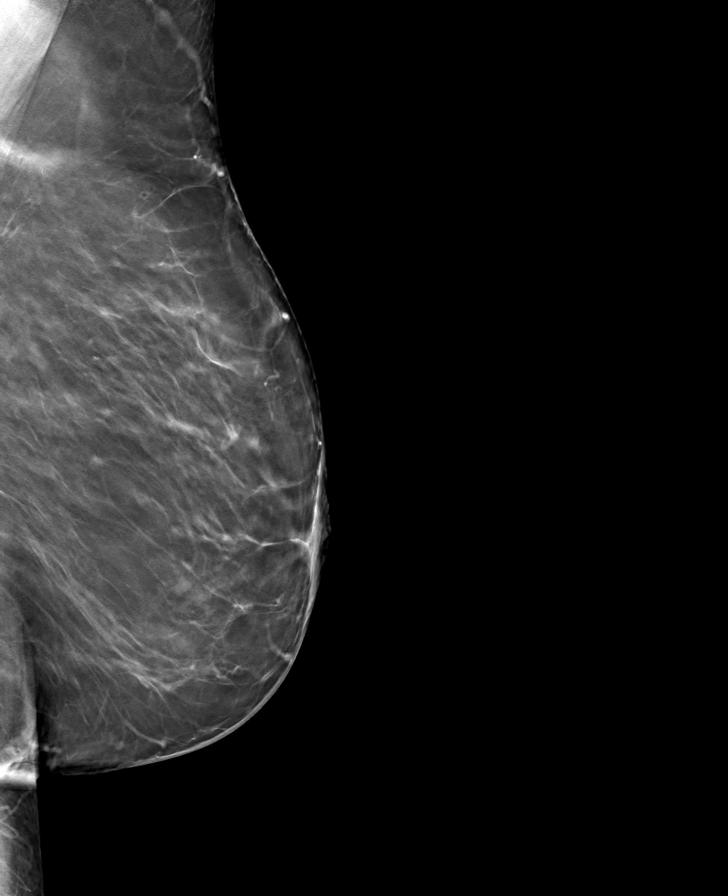

[7 of 18 positions shown; findings below may reference images not displayed]

ACR Breast Density Category b: There are scattered areas of
fibroglandular density.
FINDINGS: No suspicious masses or calcifications are seen in either breast.
Spot compression XCCL tomograms were performed over the palpable and
tender area of concern in the upper outer far posterior left breast
with no suspicious masses or abnormalities seen, only stable
lumpectomy changes identified.

Physical examination reveals generalized thickening/firmness at the
lumpectomy site in the upper far outer left breast.

Mammographic images were processed with CAD.

Targeted ultrasound of the left breast was performed. No suspicious
masses or abnormality seen in the region of palpable concern at the
lumpectomy site.
IMPRESSION: 1. No mammographic or sonographic abnormalities at the site of
palpable concern with associated tenderness near the lumpectomy site
in the upper-outer left breast.

2.  No mammographic evidence of malignancy in either breast.

RECOMMENDATION:
1. Recommend further management of the left breast palpable area of
concern be based on clinical assessment.

2.  Screening mammogram in one year.(Code:XQ-Z-ZDA)

I have discussed the findings and recommendations with the patient.
If applicable, a reminder letter will be sent to the patient
regarding the next appointment.

BI-RADS CATEGORY  2: Benign.

ADDENDUM:
An addendum is made to this report due to typographical errors in
the CLINICAL DATA and RECOMMENDATION portions of this exam. See
corrected report below:
ACR Breast Density Category b: There are scattered areas of
fibroglandular density.
FINDINGS: No suspicious masses or calcifications are seen in either breast.
Spot compression XCCL tomograms were performed over the palpable and
tender area of concern in the upper outer far posterior left breast
with no suspicious masses or abnormalities seen, only stable
lumpectomy changes identified.

Physical examination reveals generalized thickening/firmness at the
lumpectomy site in the upper far outer left breast.

Mammographic images were processed with CAD.

Targeted ultrasound of the left breast was performed. No suspicious
masses or abnormality seen in the region of palpable concern at the
lumpectomy site.
IMPRESSION: 1. No mammographic or sonographic abnormalities at the site of
palpable concern with associated tenderness near the lumpectomy site
in the upper-outer left breast.

2.  No mammographic evidence of malignancy in either breast.

RECOMMENDATION:
1. Recommend further management of the left breast palpable area of
concern be based on clinical assessment.

2.  Screening mammogram in one year.(Code:XQ-Z-ZDA)

I have discussed the findings and recommendations with the patient.
If applicable, a reminder letter will be sent to the patient
regarding the next appointment.

BI-RADS CATEGORY  2: Benign.

## 2019-12-15 IMAGING — US US BREAST*L* LIMITED INC AXILLA
1 series · 2 of 2 positions shown · non-contrast
Comparison: Previous exams.
COMPARISON: Previous exams.

Addendum:
CLINICAL DATA: 4-year-old female with a palpable/tender area of
concern near the lumpectomy site in the upper-outer left breast.
Patient does have a history of prior left breast cancer post
lumpectomy 3661.

EXAM:
DIGITAL DIAGNOSTIC UNILATERAL LEFT MAMMOGRAM WITH TOMO AND CAD;
ULTRASOUND LEFT BREAST LIMITED
CLINICAL DATA: 58-year-old female with a palpable/tender area of
RECOMMENDATION: 2. Recommend annual routine screening mammography,
due June 2020.
*** End of Addendum ***

[Series 1: us breast*left* limited inc axilla · 0.06mm/px · 2 of 2 slices shown]
[im 1/2]
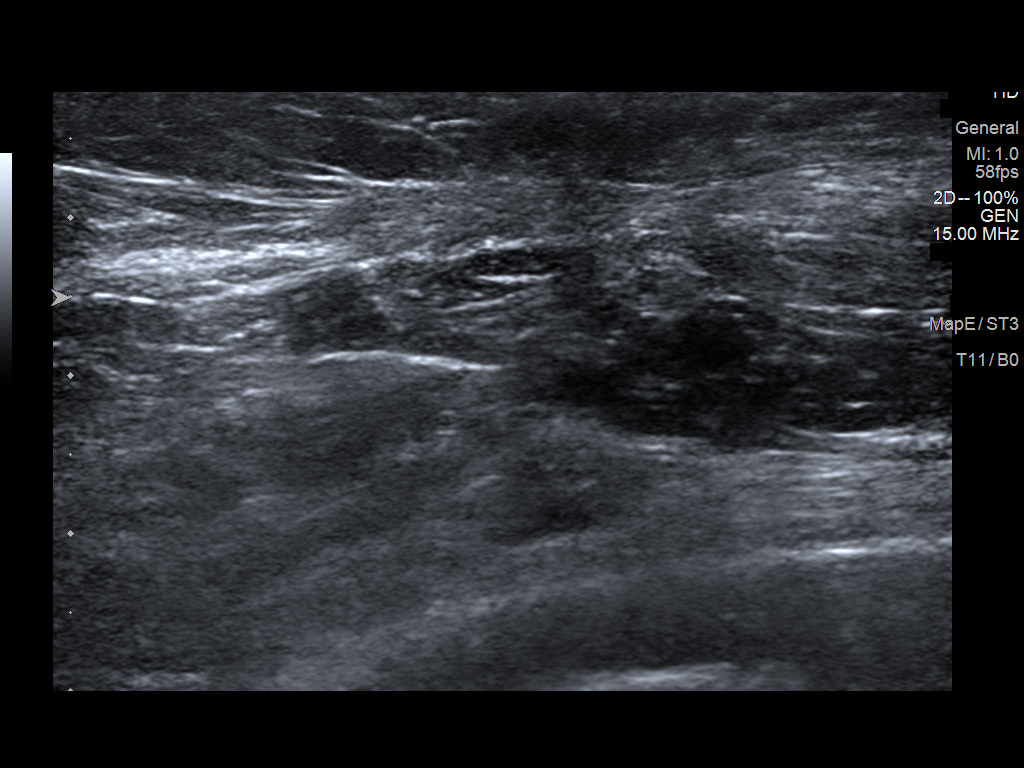
[im 2/2]
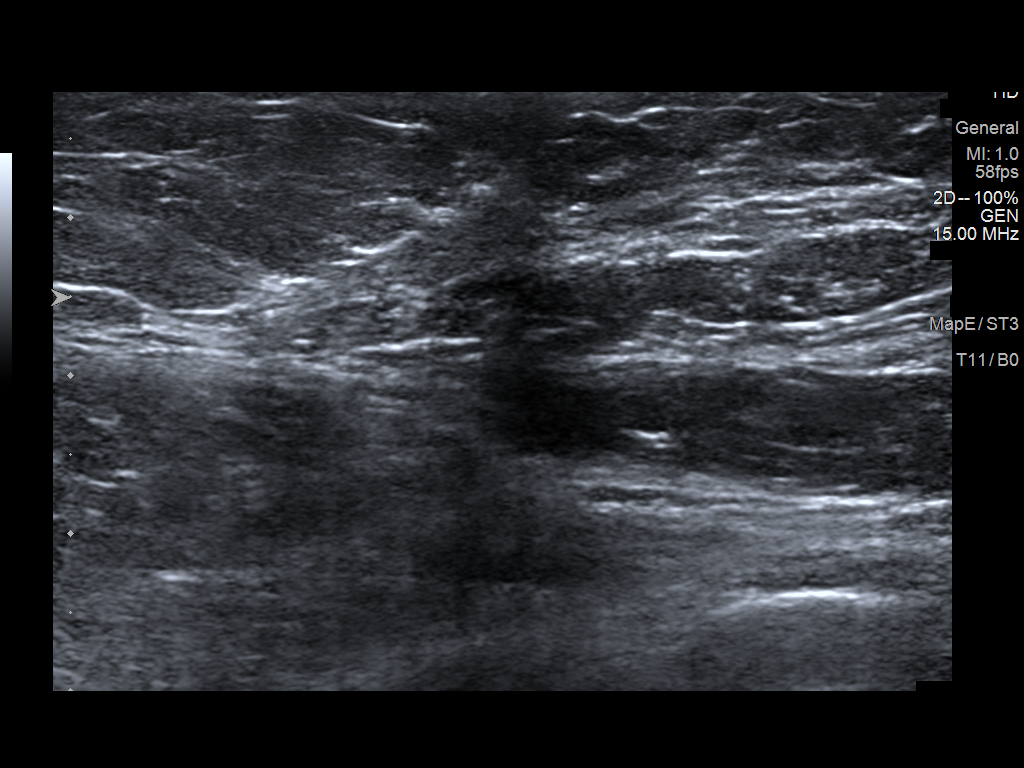

[2 of 2 positions shown; findings below may reference images not displayed]

ACR Breast Density Category b: There are scattered areas of
fibroglandular density.
FINDINGS: No suspicious masses or calcifications are seen in either breast.
Spot compression XCCL tomograms were performed over the palpable and
tender area of concern in the upper outer far posterior left breast
with no suspicious masses or abnormalities seen, only stable
lumpectomy changes identified.

Physical examination reveals generalized thickening/firmness at the
lumpectomy site in the upper far outer left breast.

Mammographic images were processed with CAD.

Targeted ultrasound of the left breast was performed. No suspicious
masses or abnormality seen in the region of palpable concern at the
lumpectomy site.
IMPRESSION: 1. No mammographic or sonographic abnormalities at the site of
palpable concern with associated tenderness near the lumpectomy site
in the upper-outer left breast.

2.  No mammographic evidence of malignancy in either breast.

RECOMMENDATION:
1. Recommend further management of the left breast palpable area of
concern be based on clinical assessment.

2.  Screening mammogram in one year.(Code:XQ-Z-ZDA)

I have discussed the findings and recommendations with the patient.
If applicable, a reminder letter will be sent to the patient
regarding the next appointment.

BI-RADS CATEGORY  2: Benign.

ADDENDUM:
An addendum is made to this report due to typographical errors in
the CLINICAL DATA and RECOMMENDATION portions of this exam. See
corrected report below:
ACR Breast Density Category b: There are scattered areas of
fibroglandular density.
FINDINGS: No suspicious masses or calcifications are seen in either breast.
Spot compression XCCL tomograms were performed over the palpable and
tender area of concern in the upper outer far posterior left breast
with no suspicious masses or abnormalities seen, only stable
lumpectomy changes identified.

Physical examination reveals generalized thickening/firmness at the
lumpectomy site in the upper far outer left breast.

Mammographic images were processed with CAD.

Targeted ultrasound of the left breast was performed. No suspicious
masses or abnormality seen in the region of palpable concern at the
lumpectomy site.
IMPRESSION: 1. No mammographic or sonographic abnormalities at the site of
palpable concern with associated tenderness near the lumpectomy site
in the upper-outer left breast.

2.  No mammographic evidence of malignancy in either breast.

RECOMMENDATION:
1. Recommend further management of the left breast palpable area of
concern be based on clinical assessment.

2.  Screening mammogram in one year.(Code:XQ-Z-ZDA)

I have discussed the findings and recommendations with the patient.
If applicable, a reminder letter will be sent to the patient
regarding the next appointment.

BI-RADS CATEGORY  2: Benign.

## 2020-02-04 ENCOUNTER — Encounter: Payer: Medicare HMO | Attending: Physical Medicine & Rehabilitation | Admitting: Registered Nurse

## 2020-02-04 ENCOUNTER — Encounter: Payer: Self-pay | Admitting: Registered Nurse

## 2020-02-04 ENCOUNTER — Other Ambulatory Visit: Payer: Self-pay

## 2020-02-04 VITALS — BP 146/81 | HR 100 | Temp 98.8°F | Ht 63.0 in | Wt 291.0 lb

## 2020-02-04 DIAGNOSIS — Z981 Arthrodesis status: Secondary | ICD-10-CM

## 2020-02-04 DIAGNOSIS — Z79891 Long term (current) use of opiate analgesic: Secondary | ICD-10-CM

## 2020-02-04 DIAGNOSIS — M48061 Spinal stenosis, lumbar region without neurogenic claudication: Secondary | ICD-10-CM | POA: Insufficient documentation

## 2020-02-04 DIAGNOSIS — F329 Major depressive disorder, single episode, unspecified: Secondary | ICD-10-CM | POA: Insufficient documentation

## 2020-02-04 DIAGNOSIS — Z853 Personal history of malignant neoplasm of breast: Secondary | ICD-10-CM | POA: Insufficient documentation

## 2020-02-04 DIAGNOSIS — Z9889 Other specified postprocedural states: Secondary | ICD-10-CM | POA: Diagnosis not present

## 2020-02-04 DIAGNOSIS — G4733 Obstructive sleep apnea (adult) (pediatric): Secondary | ICD-10-CM | POA: Diagnosis not present

## 2020-02-04 DIAGNOSIS — M961 Postlaminectomy syndrome, not elsewhere classified: Secondary | ICD-10-CM

## 2020-02-04 DIAGNOSIS — G8929 Other chronic pain: Secondary | ICD-10-CM | POA: Diagnosis present

## 2020-02-04 DIAGNOSIS — M47817 Spondylosis without myelopathy or radiculopathy, lumbosacral region: Secondary | ICD-10-CM | POA: Diagnosis not present

## 2020-02-04 DIAGNOSIS — M545 Low back pain: Secondary | ICD-10-CM | POA: Diagnosis present

## 2020-02-04 DIAGNOSIS — G894 Chronic pain syndrome: Secondary | ICD-10-CM | POA: Diagnosis present

## 2020-02-04 DIAGNOSIS — M1711 Unilateral primary osteoarthritis, right knee: Secondary | ICD-10-CM

## 2020-02-04 DIAGNOSIS — M7062 Trochanteric bursitis, left hip: Secondary | ICD-10-CM

## 2020-02-04 DIAGNOSIS — M797 Fibromyalgia: Secondary | ICD-10-CM | POA: Diagnosis not present

## 2020-02-04 DIAGNOSIS — K219 Gastro-esophageal reflux disease without esophagitis: Secondary | ICD-10-CM | POA: Insufficient documentation

## 2020-02-04 DIAGNOSIS — Z5181 Encounter for therapeutic drug level monitoring: Secondary | ICD-10-CM | POA: Diagnosis present

## 2020-02-04 DIAGNOSIS — G8918 Other acute postprocedural pain: Secondary | ICD-10-CM | POA: Insufficient documentation

## 2020-02-04 DIAGNOSIS — M62838 Other muscle spasm: Secondary | ICD-10-CM

## 2020-02-04 MED ORDER — GABAPENTIN 300 MG PO CAPS: 300.0000 mg | ORAL_CAPSULE | Freq: Three times a day (TID) | ORAL | 5 refills | Status: DC

## 2020-02-04 MED ORDER — TRAMADOL HCL 50 MG PO TABS: 50.0000 mg | ORAL_TABLET | Freq: Four times a day (QID) | ORAL | 5 refills | Status: DC | PRN

## 2020-02-04 NOTE — Progress Notes (Signed)
Subjective:    Patient ID: Kelsey Wilson, female    DOB: Sep 30, 1960, 59 y.o.   MRN: [id number]  HPI: Kelsey Wilson is a 59 y.o. female who returns for follow up appointment for chronic pain and medication refill. She states her pain is located in her lower back, left hip and right knee. She rates her pain 8. Her current exercise regime is walking and performing stretching exercises.  Ms. Mitschke Morphine equivalent is  15.00 MME.She is also prescribed Alprazolam by Selina Cooley. We have discussed the black box warning of using opioids and benzodiazepines. I highlighted the dangers of using these drugs together and discussed the adverse events including respiratory suppression, overdose, cognitive impairment and importance of compliance with current regimen. We will continue to monitor and adjust as indicated.    Last Oral Swab was Performed on 09/11/2019, it was consistent.    Pain Inventory Average Pain 7 Pain Right Now 8 My pain is tingling and aching  In the last 24 hours, has pain interfered with the following? General activity 2 Relation with others 2 Enjoyment of life 2 What TIME of day is your pain at its worst? morning , daytime, evening and night Sleep (in general) Fair  Pain is worse with: walking, bending and sitting Pain improves with: rest and medication Relief from Meds: 2  Family History  Problem Relation Age of Onset  . Hypertension Mother   . Diabetes Mother   . Hypertension Father   . Diabetes Father   . Cancer Paternal Grandmother        unknown  . Breast cancer Paternal Aunt   . Hypertension Sister   . Hyperlipidemia Brother   . Colon cancer Neg Hx    Social History   Socioeconomic History  . Marital status: Single    Spouse name: Not on file  . Number of children: 3  . Years of education: Not on file  . Highest education level: Not on file  Occupational History  . Not on file  Tobacco Use  . Smoking status: Never Smoker  .  Smokeless tobacco: Never Used  Vaping Use  . Vaping Use: Never used  Substance and Sexual Activity  . Alcohol use: No    Alcohol/week: 0.0 standard drinks  . Drug use: No  . Sexual activity: Never    Birth control/protection: Post-menopausal, Surgical  Other Topics Concern  . Not on file  Social History Narrative  . Not on file   Social Determinants of Health   Financial Resource Strain:   . Difficulty of Paying Living Expenses: Not on file  Food Insecurity:   . Worried About Charity fundraiser in the Last Year: Not on file  . Ran Out of Food in the Last Year: Not on file  Transportation Needs:   . Lack of Transportation (Medical): Not on file  . Lack of Transportation (Non-Medical): Not on file  Physical Activity:   . Days of Exercise per Week: Not on file  . Minutes of Exercise per Session: Not on file  Stress:   . Feeling of Stress : Not on file  Social Connections:   . Frequency of Communication with Friends and Family: Not on file  . Frequency of Social Gatherings with Friends and Family: Not on file  . Attends Religious Services: Not on file  . Active Member of Clubs or Organizations: Not on file  . Attends Archivist Meetings: Not on file  . Marital Status: Not  on file   Past Surgical History:  Procedure Laterality Date  . ABDOMINAL HYSTERECTOMY     still has ovaries  . APPENDECTOMY    . AXILLARY LYMPH NODE DISSECTION  11/28/2011   Procedure: AXILLARY LYMPH NODE DISSECTION;  Surgeon: Edward Jolly, MD;  Location: [place];  Service: General;  Laterality: Left;  . BREAST LUMPECTOMY Left 11/28/2011   Malignant  . BREAST SURGERY Left 2013  . CARPAL TUNNEL RELEASE     Bilateral  . CESAREAN SECTION     pt. has had 3  . CHOLECYSTECTOMY    . COLONOSCOPY    . EYE SURGERY Bilateral    cataract removal  . KNEE SURGERY     Left Knee  . LAMINECTOMY WITH POSTERIOR LATERAL ARTHRODESIS LEVEL 1 N/A 11/29/2017   Procedure: Posterior Lateral Fusion - Lumbar  Four-Lumbar Five, removal and replacement of hardware, Laminectomy - Lumbar Four-Lumbar Five;  Surgeon: Eustace Moore, MD;  [facility];  Service: Neurosurgery;  Laterality: N/A;  . LAMINECTOMY WITH POSTERIOR LATERAL ARTHRODESIS LEVEL 2 N/A 06/07/2017   Procedure: Posterior Lateral Fusion Lumbar Three-Four and Transforaminal Interbody Fusion Lumbar Four-Five with Segmental  Pedicle Screw Fixation;  Surgeon: Eustace Moore, MD;  Location: [place];  Service: Neurosurgery;  Laterality: N/A;  Posterior Lateral Fusion Lumbar Three-Four and Transforaminal Interbody Fusion Lumbar Four-Five with Segmental  Pedicle Screw Fixation   . LUMBAR FUSION  06/07/2017   POST  . LUMBAR LAMINECTOMY/DECOMPRESSION MICRODISCECTOMY Bilateral 01/27/2016   Procedure: Laminectomy and Foraminotomy - Lumbar four -Lumbar five - bilateral- on-lay noninstrumented fusion;  Surgeon: Eustace Moore, MD;  Location: [place] [facility];  Service: Neurosurgery;  Laterality: Bilateral;  . MULTIPLE EXTRACTIONS WITH ALVEOLOPLASTY N/A 05/11/2016   Procedure: EXTRACTION OF TEETH EIGHTEEN, TWENTY AND TWENTY- NINE;  REMOVAL OF MANDIBULAR TORUS AND EXOSTOSIS;  Surgeon: Diona Browner, DDS;  Location: [place];  Service: Oral Surgery;  Laterality: N/A;  . PORTACATH PLACEMENT  06/18/2011   Procedure: INSERTION PORT-A-CATH;  Surgeon: Edward Jolly, MD;  Location: [place];  Service: General;  Laterality: Right;  right subclavian  . removal portacath  2014  . spur     Apex spur on both big toes  . TOE SURGERY Bilateral   . TONSILLECTOMY    . TOTAL KNEE ARTHROPLASTY Left 09/13/2014  . TOTAL KNEE ARTHROPLASTY Left 09/13/2014   Procedure: LEFT TOTAL KNEE ARTHROPLASTY;  Surgeon: Rod Can, MD;  Location: [place];  Service: Orthopedics;  Laterality: Left;  . TOTAL KNEE ARTHROPLASTY Right 03/10/2015   Procedure: RIGHT TOTAL KNEE ARTHROPLASTY;  Surgeon: Rod Can, MD;  Location: [facility];  Service: Orthopedics;  Laterality: Right;   Past  Surgical History:  Procedure Laterality Date  . ABDOMINAL HYSTERECTOMY     still has ovaries  . APPENDECTOMY    . AXILLARY LYMPH NODE DISSECTION  11/28/2011   Procedure: AXILLARY LYMPH NODE DISSECTION;  Surgeon: Edward Jolly, MD;  Location: [place];  Service: General;  Laterality: Left;  . BREAST LUMPECTOMY Left 11/28/2011   Malignant  . BREAST SURGERY Left 2013  . CARPAL TUNNEL RELEASE     Bilateral  . CESAREAN SECTION     pt. has had 3  . CHOLECYSTECTOMY    . COLONOSCOPY    . EYE SURGERY Bilateral    cataract removal  . KNEE SURGERY     Left Knee  . LAMINECTOMY WITH POSTERIOR LATERAL ARTHRODESIS LEVEL 1 N/A 11/29/2017   Procedure: Posterior Lateral Fusion - Lumbar  Four-Lumbar Five, removal and replacement of hardware, Laminectomy - Lumbar Four-Lumbar Five;  Surgeon: Eustace Moore, MD;  Location: [place];  Service: Neurosurgery;  Laterality: N/A;  . LAMINECTOMY WITH POSTERIOR LATERAL ARTHRODESIS LEVEL 2 N/A 06/07/2017   Procedure: Posterior Lateral Fusion Lumbar Three-Four and Transforaminal Interbody Fusion Lumbar Four-Five with Segmental  Pedicle Screw Fixation;  Surgeon: Eustace Moore, MD;  Location: [place];  Service: Neurosurgery;  Laterality: N/A;  Posterior Lateral Fusion Lumbar Three-Four and Transforaminal Interbody Fusion Lumbar Four-Five with Segmental  Pedicle Screw Fixation   . LUMBAR FUSION  06/07/2017   POST  . LUMBAR LAMINECTOMY/DECOMPRESSION MICRODISCECTOMY Bilateral 01/27/2016   Procedure: Laminectomy and Foraminotomy - Lumbar four -Lumbar five - bilateral- on-lay noninstrumented fusion;  Surgeon: Eustace Moore, MD;  Location: [place] [facility];  Service: Neurosurgery;  Laterality: Bilateral;  . MULTIPLE EXTRACTIONS WITH ALVEOLOPLASTY N/A 05/11/2016   Procedure: EXTRACTION OF TEETH EIGHTEEN, TWENTY AND TWENTY- NINE;  REMOVAL OF MANDIBULAR TORUS AND EXOSTOSIS;  Surgeon: Diona Browner, DDS;  Location: [place];  Service: Oral Surgery;  Laterality: N/A;  . PORTACATH PLACEMENT   06/18/2011   Procedure: INSERTION PORT-A-CATH;  Surgeon: Edward Jolly, MD;  Location: [place];  Service: General;  Laterality: Right;  right subclavian  . removal portacath  2014  . spur     Apex spur on both big toes  . TOE SURGERY Bilateral   . TONSILLECTOMY    . TOTAL KNEE ARTHROPLASTY Left 09/13/2014  . TOTAL KNEE ARTHROPLASTY Left 09/13/2014   Procedure: LEFT TOTAL KNEE ARTHROPLASTY;  Surgeon: Rod Can, MD;  Location: [place];  Service: Orthopedics;  Laterality: Left;  . TOTAL KNEE ARTHROPLASTY Right 03/10/2015   Procedure: RIGHT TOTAL KNEE ARTHROPLASTY;  Surgeon: Rod Can, MD;  Location[facility];  Service: Orthopedics;  Laterality: Right;   Past Medical History:  Diagnosis Date  . Anemia   . Anxiety   . Arthritis   . Breast cancer ([place]) 2010   T3N1 invasive ductal carcinoma left breast.Takes Arimidex daily  . Bursitis   . Carpal tunnel syndrome   . Chronic back pain    stenosis  . Constipation    takes Colace daily  . Depression    takes Benzotropine daily  . Diverticulitis of colon   . Dyspnea    daily when walking for over 1 yr.  . Fibromyalgia 08/2012  . GERD (gastroesophageal reflux disease)    takes Dexilant daily  . Hemorrhoid   . History of blood transfusion    no abnormal reaction noted  . History of colon polyps    benign  . History of shingles   . Joint pain   . Joint swelling   . Night muscle spasms    takes Flexeril nightly as needed  . Nocturia   . OSA (obstructive sleep apnea)   . OSA on CPAP   . Peripheral edema    takes Furosemide.Just started 01/18/16  . Peripheral neuropathy    takes Lyrica daily  . Personal history of chemotherapy    2013  . Personal history of radiation therapy    2013  . Pneumonia    hx of-2015  . Pseudoarthrosis of lumbar spine   . Seasonal allergies    takes Singulair nightly  . SOB (shortness of breath) on exertion    rarely with exertion  . 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 Alphonzo Grieve)   BP (!) 146/81 (BP Location: Right Wrist)   Pulse 100   Temp 98.8 F (37.1 C)   Ht 5\' 3"  (1.6 m)   Wt 291 lb (132 kg)   SpO2 93%   BMI 51.55 kg/m   Opioid Risk Score:   Fall Risk Score:  `1  Depression screen PHQ 2/9  Depression screen [facility] 2/9 08/04/2018 06/06/2018 04/04/2018 01/13/2018 12/13/2017 06/21/2017 03/07/2017  Decreased Interest 0 0 0 3 3 1 2   Down, Depressed, Hopeless 0 0 0 3 3 1 2   PHQ - 2 Score 0 0 0 6 6 2 4   Altered sleeping - - - - - - 0  Tired, decreased energy - - - - - - 3  Change in appetite - - - - - - 3  Feeling bad or failure about yourself  - - - - - - 2  Trouble concentrating - - - - - - 3  Moving slowly or fidgety/restless - - - - - - 3  Suicidal thoughts - - - - - - 0  PHQ-9 Score - - - - - - 18  Difficult doing work/chores - - - - - - Extremely dIfficult  Some recent data might be hidden    Review of Systems  Constitutional: Negative.   HENT: Negative.   Eyes: Negative.   Respiratory: Negative.   Cardiovascular: Negative.   Gastrointestinal: Negative.   Endocrine: Negative.   Genitourinary: Negative.   Musculoskeletal: Positive for arthralgias and back pain.  Skin: Negative.   Allergic/Immunologic: Negative.   Neurological: Positive for numbness.  Hematological: Negative.   Psychiatric/Behavioral: Negative.   All other systems reviewed and are negative.      Objective:   Physical Exam Vitals and nursing note reviewed.  Constitutional:      Appearance: Normal appearance. She is obese.  Cardiovascular:     Rate and Rhythm: Normal rate and regular rhythm.     Pulses: Normal pulses.     Heart sounds: Normal heart sounds.  Pulmonary:     Effort: Pulmonary effort is normal.     Breath sounds: Normal breath sounds.  Musculoskeletal:     Cervical back: Normal range of motion and neck supple.     Comments: Normal  Muscle Bulk and Muscle Testing Reveals:  Upper Extremities: Full ROM and Muscle Strength 5/5 Bilateral AC Joint Tenderness Lumbar Paraspinal Tenderness: L-3-L-5 Left Greater Trochanteric tenderness Lower Extremities: Full ROM and Muscle Strength 5/5 Bilateral lower extremity flexion Produces Pain into her Bilateral Patella;s R>L Arises from table Slowly using cane for support Antalgic Gait   Skin:    General: Skin is warm and dry.  Neurological:     Mental Status: She is alert and oriented to person, place, and time.  Psychiatric:        Mood and Affect: Mood normal.        Behavior: Behavior normal.           Assessment & Plan:  1. Chronic Sacroiliac Joint Pain: S/PBilateral Sacroilliac Injection with Dr. Berenice Bouton 06/23/2019. With good relief noted. 02/04/2020. 2.. Lumbar Postlaminectomy/ S/P Lumbar Fusion:Spinal Stenosis:S/PPosterior Lateral Fusion L-4-L-5 removal and replacement of hardware, Laminectomy L4-L5 by Dr. Ronnald Ramp on12/19/2019. Continue HEP as Tolerated and Continue current medicationregimen. 02/04/2020 Refill: Tramadol 50 mg one tablet every 6 hours as needed for pain #120. Hydrocodone was discontinued. We will continue the opioid monitoring program, this consists of regular clinic visits, examinations, urine drug screen, pill counts as well  as use of [place] Controlled Substance Reporting system. A 12 month History has been reviewed on the [place] on 02/04/2020. 3. Lumbar Radiculitis: No complaints today. Continue current medication regime withGabapentin.She reports she's awaiting a scheduled appointment with Dr Maryjean Ka for spinal stimulator.02/04/2020 4. Fibromyalgia:ContinueHEP as Tolerated.Continue Current Medication Regimen withGabapentin.02/04/2020 5.LeftGreater Trochanter Bursitis:Continue to Alternate Ice and Heat Therapy.Continue to monitor. 02/04/2020. 6. Muscle  Spasm:Continue:Flexeril.Continue to Monitor.02/04/2020. 7. Chronic Pain Syndrome: Continue Current medication regimen and HEP as Tolerated. Continue to Monitor.02/04/2020. 8. Bilateral OA of Bilateral Knees: Orthopedist following.Continue to Monitor.02/04/2020. 9. Bilateral Hand Pain:No complaints today.Continue to Monitor/ ? OA: Continue to Monitor.02/04/2020 10. Cervicalgia:No complaints Today.Continue to alternate Heat/Ice Therapy. Continue HEP as Tolerated.02/04/2020. 11. Chronic Bilateral Shoulders:Nocomplaints today.Continue to alternate Ice/ Heat therapy and HEP as Tolerated.02/04/2020. 12. Bilateral Ankle Pain: Podiatry Following:No complaints today.Continue to Monitor.02/04/2020  F/U in 6 months  42minutes of face to face patient care time was spent during this visit. All questions were encouraged and answered.

## 2020-02-11 ENCOUNTER — Other Ambulatory Visit (HOSPITAL_COMMUNITY): Payer: Medicare HMO

## 2020-02-17 ENCOUNTER — Ambulatory Visit: Payer: Medicare HMO | Admitting: Orthotics

## 2020-02-17 ENCOUNTER — Other Ambulatory Visit: Payer: Self-pay

## 2020-02-17 DIAGNOSIS — E1142 Type 2 diabetes mellitus with diabetic polyneuropathy: Secondary | ICD-10-CM | POA: Diagnosis not present

## 2020-02-17 DIAGNOSIS — M2141 Flat foot [pes planus] (acquired), right foot: Secondary | ICD-10-CM | POA: Diagnosis not present

## 2020-02-17 DIAGNOSIS — M2142 Flat foot [pes planus] (acquired), left foot: Secondary | ICD-10-CM | POA: Diagnosis not present

## 2020-02-19 ENCOUNTER — Other Ambulatory Visit: Payer: Self-pay

## 2020-02-19 ENCOUNTER — Ambulatory Visit (HOSPITAL_COMMUNITY)
Admission: RE | Admit: 2020-02-19 | Discharge: 2020-02-19 | Disposition: A | Discharge status: Home / Self Care | Payer: Medicare HMO | Source: Ambulatory Visit | Attending: Internal Medicine | Admitting: Internal Medicine

## 2020-02-19 DIAGNOSIS — K760 Fatty (change of) liver, not elsewhere classified: Secondary | ICD-10-CM | POA: Insufficient documentation

## 2020-02-19 DIAGNOSIS — Q991 46, XX true hermaphrodite: Secondary | ICD-10-CM | POA: Diagnosis not present

## 2020-02-19 DIAGNOSIS — R918 Other nonspecific abnormal finding of lung field: Secondary | ICD-10-CM | POA: Diagnosis not present

## 2020-02-19 DIAGNOSIS — Z853 Personal history of malignant neoplasm of breast: Secondary | ICD-10-CM | POA: Diagnosis not present

## 2020-02-19 IMAGING — CT CT CHEST W/O CM
2 of 4 series · 15 of 36 positions shown, 18 images · non-contrast
Comparison: 02/13/2019

CLINICAL DATA: Multiple lung nodules, history of breast cancer

EXAM:
CT CHEST WITHOUT CONTRAST
TECHNIQUE: Multidetector CT imaging of the chest was performed following the
standard protocol without IV contrast.

[Series 2: thorax · axial · 0.73mm/px · z∈[+1540,+1766]mm · 12 of 133 slices shown, 15 images]
[im 10/133  mediastinal]
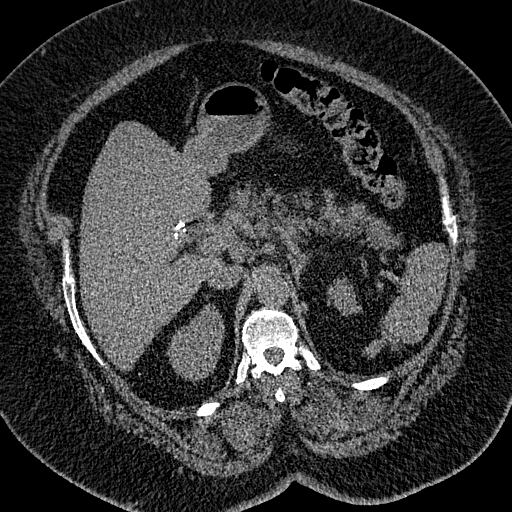
[im 10/133  lung]
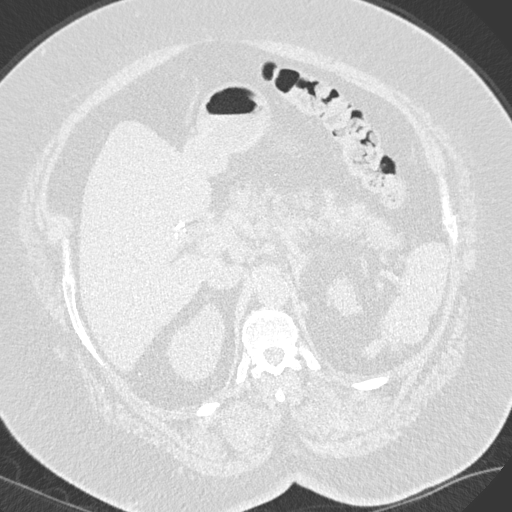
[im 19/133  lung]
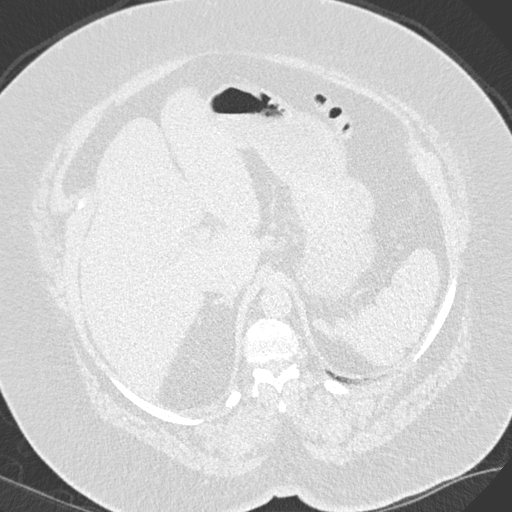
[im 29/133  lung]
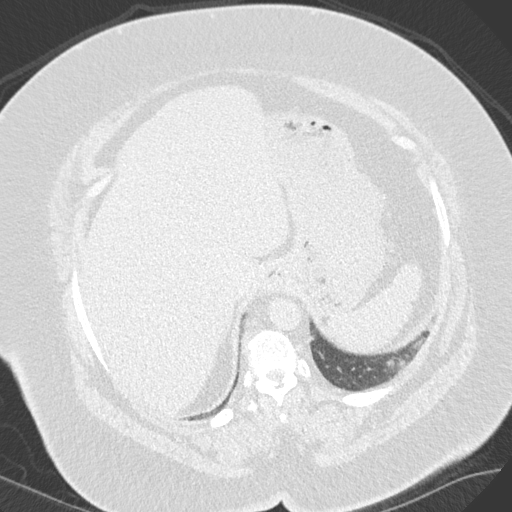
[im 38/133  lung]
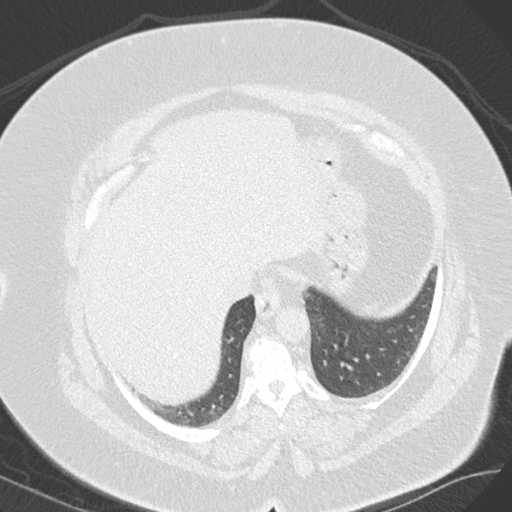
[im 48/133  mediastinal]
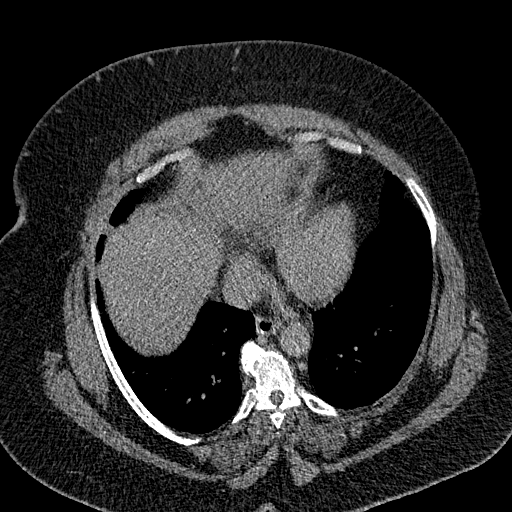
[im 48/133  lung]
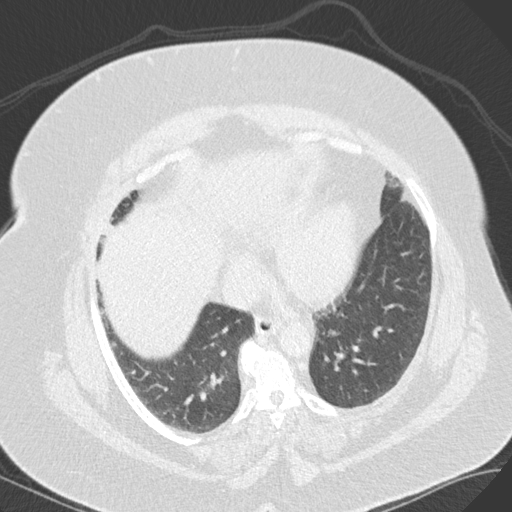
[im 57/133  lung]
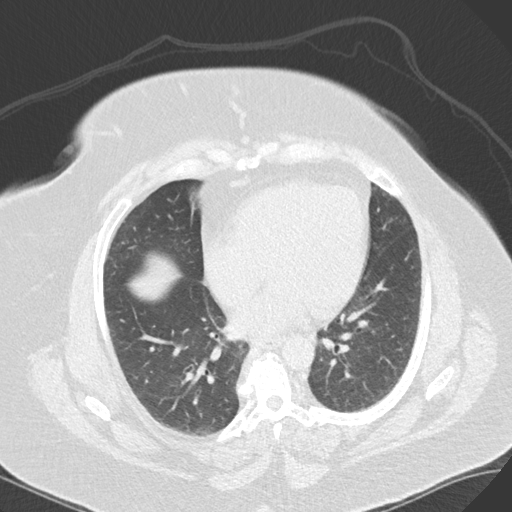
[im 76/133  lung]
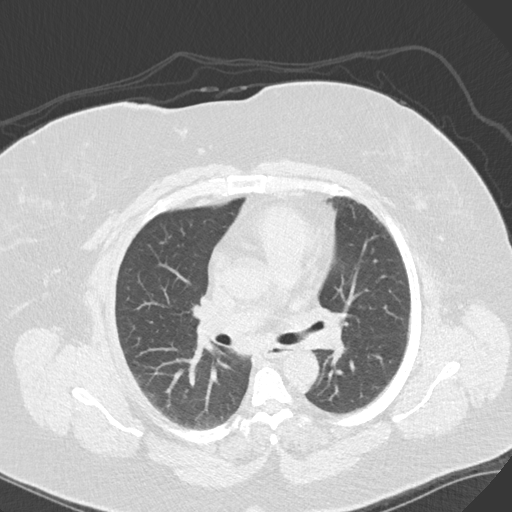
[im 85/133  lung]
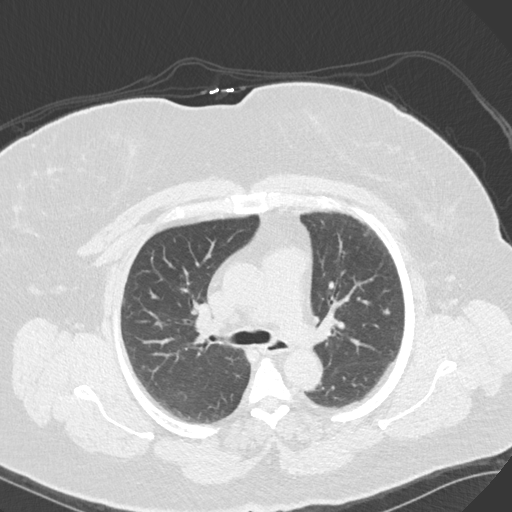
[im 95/133  mediastinal]
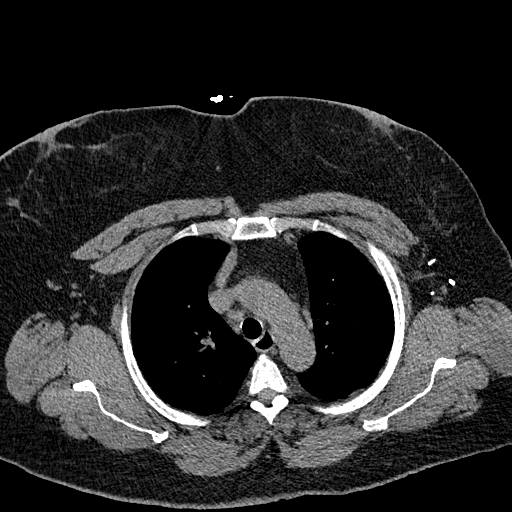
[im 95/133  lung]
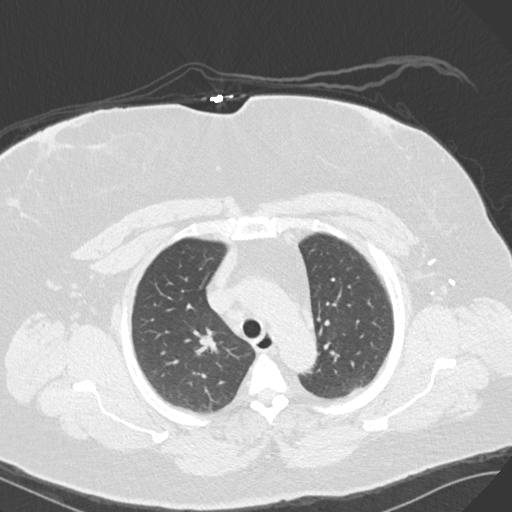
[im 104/133  lung]
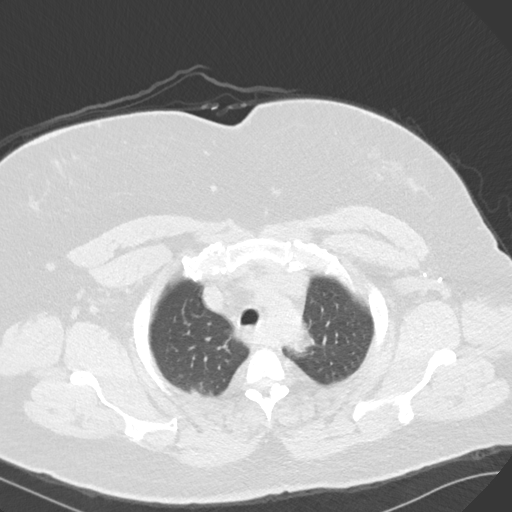
[im 114/133  lung]
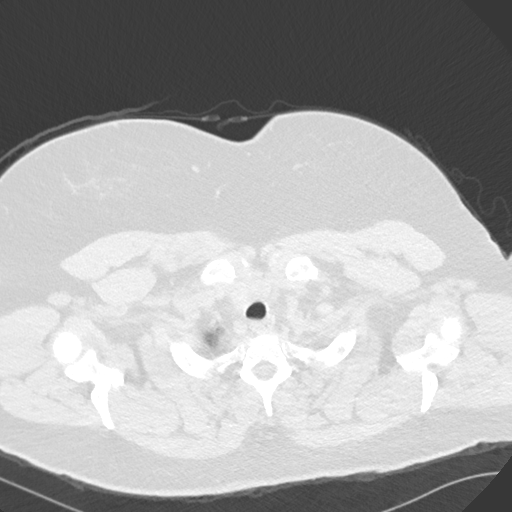
[im 123/133  lung]
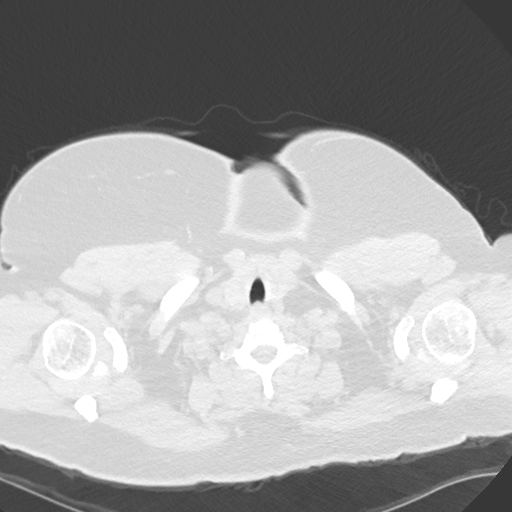

[Series 5: coronal · coronal · 0.59mm/px · 3 of 140 slices shown]
[im 28/140  lung]
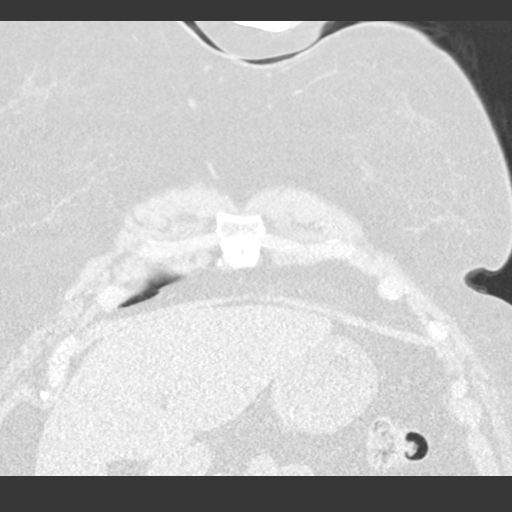
[im 56/140  lung]
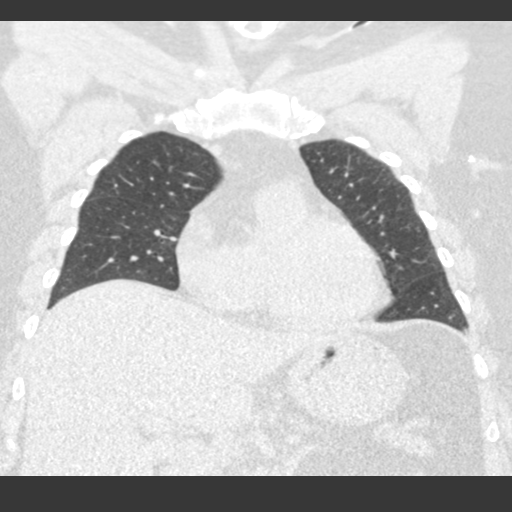
[im 84/140  lung]
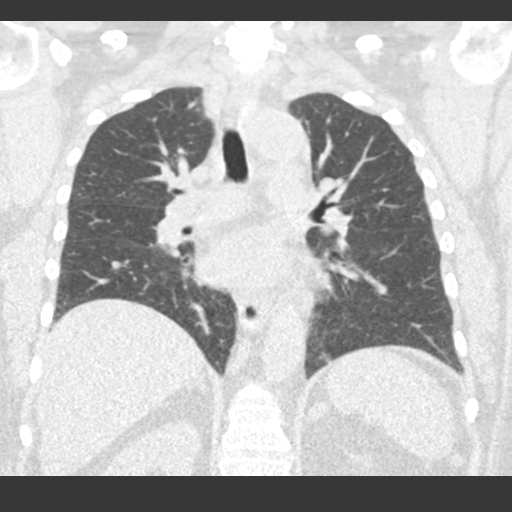

[15 of 36 positions shown; findings below may reference images not displayed]

FINDINGS: Cardiovascular: Noncontrast appearance of the heart and great
vessels is unchanged. Aberrant RIGHT subclavian. No signs of
vascular disease. No pericardial effusion. Limited assessment of
cardiovascular structures given lack of intravenous contrast.

Mediastinum/Nodes: Thoracic inlet structures without adenopathy.

LEFT axillary lymphadenectomy without signs of soft tissue in this
location. No mediastinal adenopathy, scattered subcentimeter lymph
nodes at developed since the prior study, none with pathologic
enlargement. Esophagus mildly patulous.

Lungs/Pleura:

LEFT upper lobe pulmonary nodule (image 50, series 7) stable at
approximately 4-5 mm.

Lingular pulmonary nodule (image 79, series [DATE] x 6 mm previously
approximately 11 x 9 mm.

Small juxtapleural nodule in the RIGHT lung base (image 75, series
7) approximately 7 x 4 mm surrounded by very subtle atelectatic
changes, less well-defined than other areas in the chest.

Similarly a 4 mm nodule on image 74 of series 7 appears more
conspicuous than on the prior study where there was a subtle
abnormality in this location no consolidation. No pleural effusion.
Airways are patent.

Upper Abdomen: Lobular hepatic contours with background hepatic
steatosis. Lobular splenic contours without splenic enlargement.
Liver is incompletely imaged. Post cholecystectomy.

RIGHT upper pole cyst with similar appearance. LEFT kidney and RIGHT
kidney are incompletely imaged.

Musculoskeletal: No acute bone finding. No destructive bone process.
Spinal degenerative changes.
IMPRESSION: 1. Multiple pulmonary nodules most of which are stable compared to
prior imaging. A small juxtapleural nodule in the RIGHT lung base is
likely surrounded by atelectatic changes measuring approximately 7 x
4 mm. Given appearance of new pulmonary nodule and history of
neoplasm would suggest short interval follow-up, 3 months to assess
for stability and or new pulmonary findings.
2. Tiny nodule in the LEFT lung base approximately 4 mm also either
new or slightly increased in size. Attention on follow-up.
3. Nodules that display stability are compatible with benign nodules
greater than 2 years stability documented and perhaps even slightly
decreased size of lingular nodule compared to previous imaging.
4. Hepatic steatosis.
5. Post LEFT axillary lymphadenectomy

## 2020-02-22 ENCOUNTER — Other Ambulatory Visit: Payer: Self-pay

## 2020-02-22 ENCOUNTER — Other Ambulatory Visit: Payer: Self-pay | Admitting: Internal Medicine

## 2020-02-22 DIAGNOSIS — R911 Solitary pulmonary nodule: Secondary | ICD-10-CM

## 2020-02-22 DIAGNOSIS — R918 Other nonspecific abnormal finding of lung field: Secondary | ICD-10-CM

## 2020-05-02 ENCOUNTER — Other Ambulatory Visit: Payer: Self-pay | Admitting: Nurse Practitioner

## 2020-05-02 DIAGNOSIS — Z1231 Encounter for screening mammogram for malignant neoplasm of breast: Secondary | ICD-10-CM

## 2020-05-16 ENCOUNTER — Ambulatory Visit (HOSPITAL_COMMUNITY)
Admission: RE | Admit: 2020-05-16 | Discharge: 2020-05-16 | Disposition: A | Discharge status: Home / Self Care | Payer: Medicare HMO | Source: Ambulatory Visit | Attending: Internal Medicine | Admitting: Internal Medicine

## 2020-05-16 ENCOUNTER — Encounter (HOSPITAL_COMMUNITY): Payer: Self-pay

## 2020-05-16 ENCOUNTER — Other Ambulatory Visit: Payer: Self-pay

## 2020-05-16 DIAGNOSIS — R911 Solitary pulmonary nodule: Secondary | ICD-10-CM | POA: Diagnosis present

## 2020-05-16 IMAGING — CT CT CHEST HIGH RESOLUTION W/O CM
2 of 7 series · 14 of 36 positions shown, 17 images · non-contrast
Comparison: 02/19/2020, 02/13/2019, 12/16/2017

CLINICAL DATA: Follow-up lung nodules, chronic cough, shortness of
breath, history of lumpectomy, XRT and chemotherapy for left breast
cancer

EXAM:
CT CHEST WITHOUT CONTRAST
TECHNIQUE: Multidetector CT imaging of the chest was performed following the
standard protocol without intravenous contrast. High resolution
imaging of the lungs, as well as inspiratory and expiratory imaging,
was performed.

[Series 4: high resolution retro · axial · 0.72mm/px · z∈[+938,+1152]mm · 11 of 258 slices shown, 14 images]
[im 22/258  mediastinal]
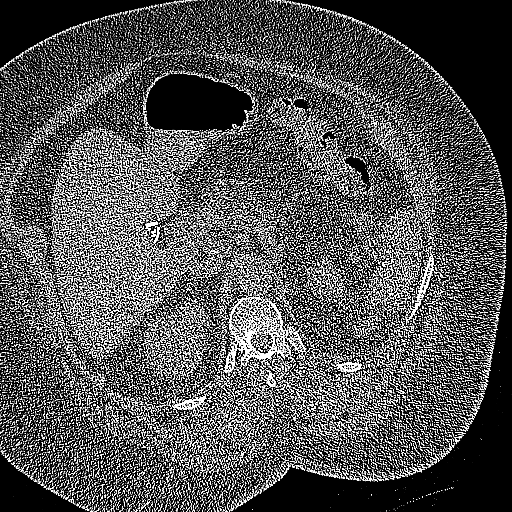
[im 22/258  lung]
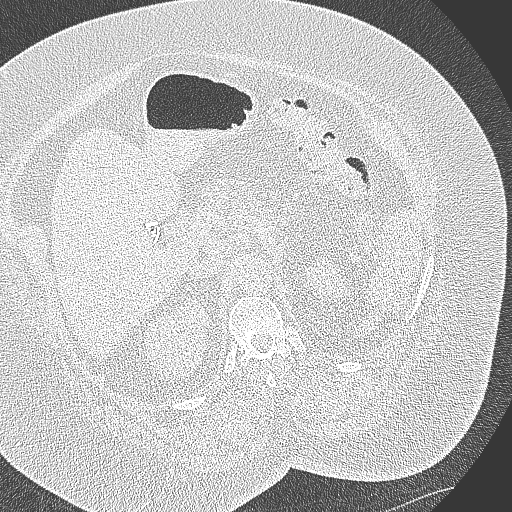
[im 43/258  lung]
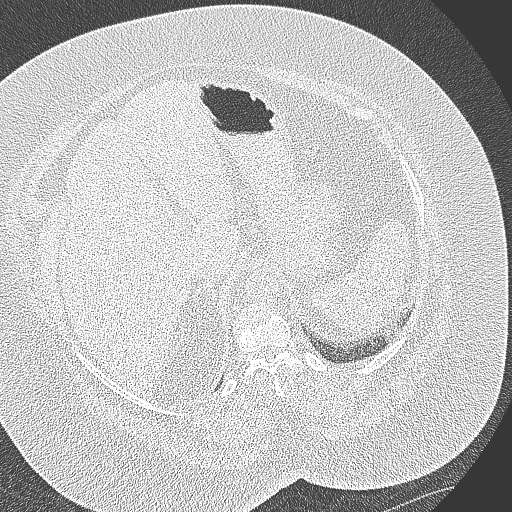
[im 65/258  lung]
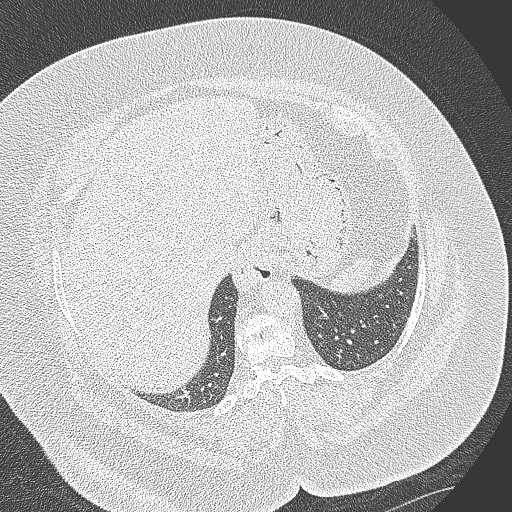
[im 86/258  lung]
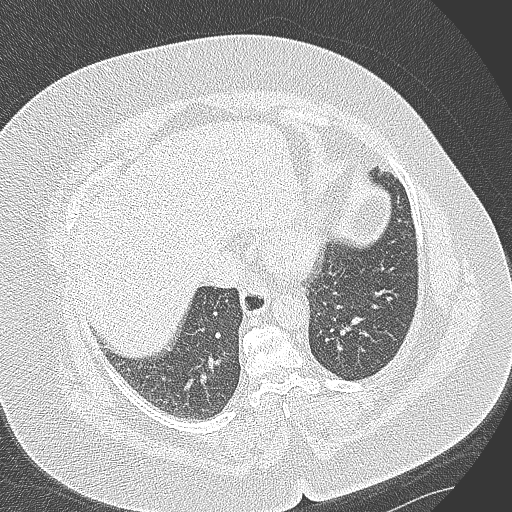
[im 108/258  mediastinal]
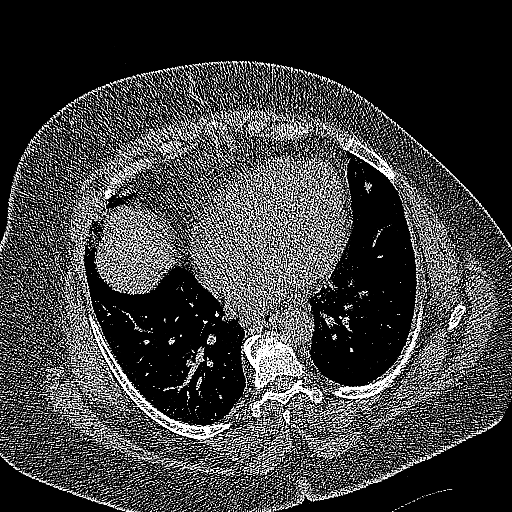
[im 108/258  lung]
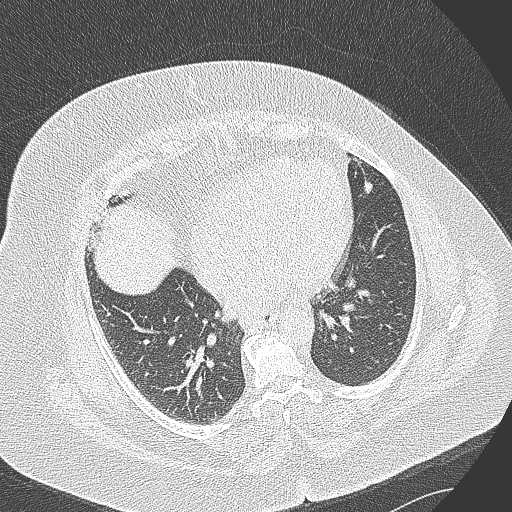
[im 129/258  lung]
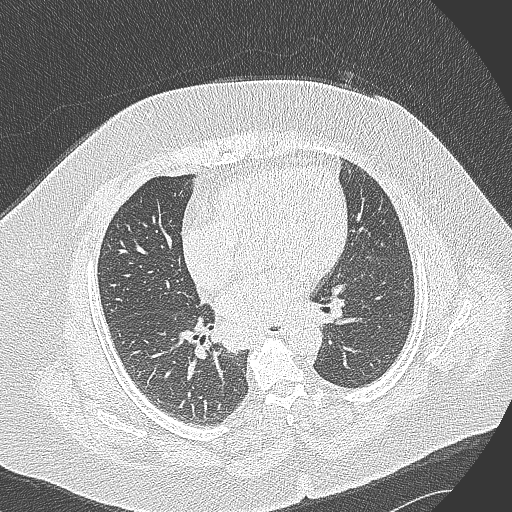
[im 150/258  lung]
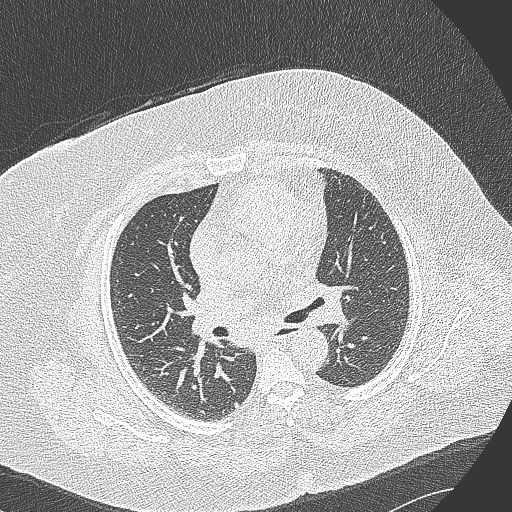
[im 172/258  lung]
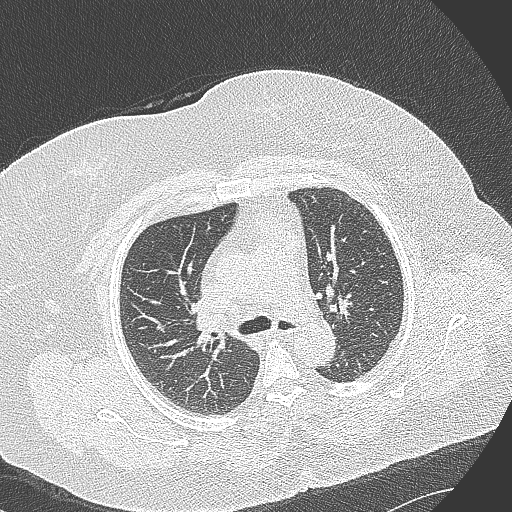
[im 193/258  mediastinal]
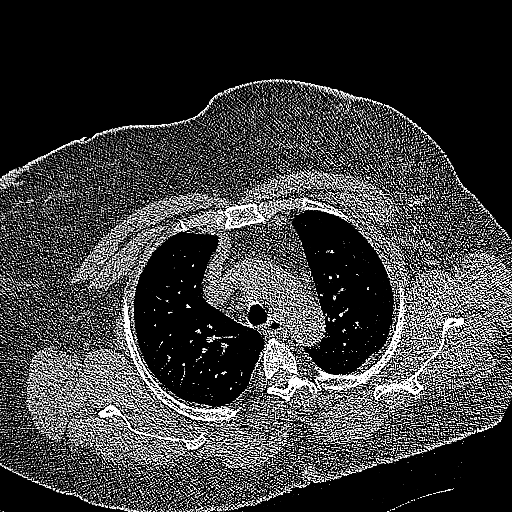
[im 193/258  lung]
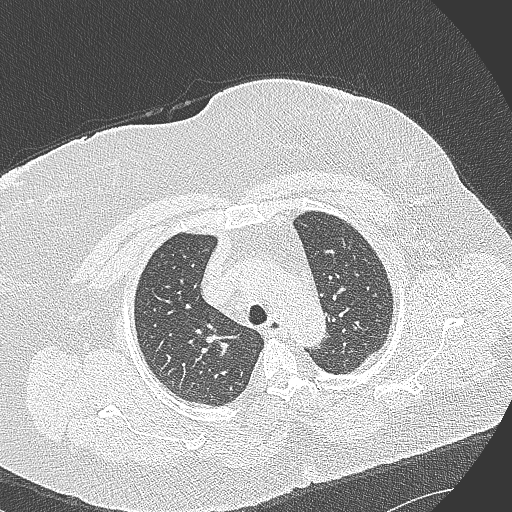
[im 215/258  lung]
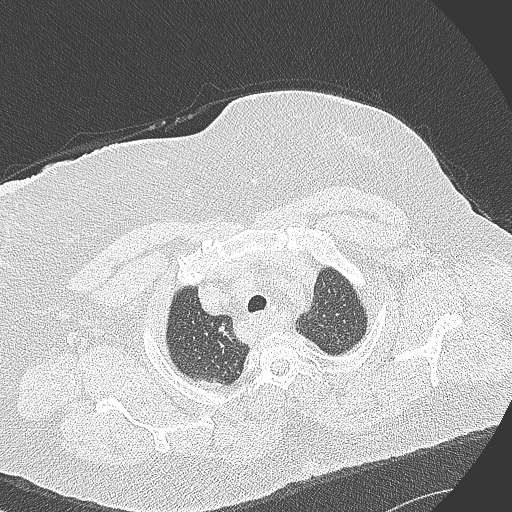
[im 236/258  lung]
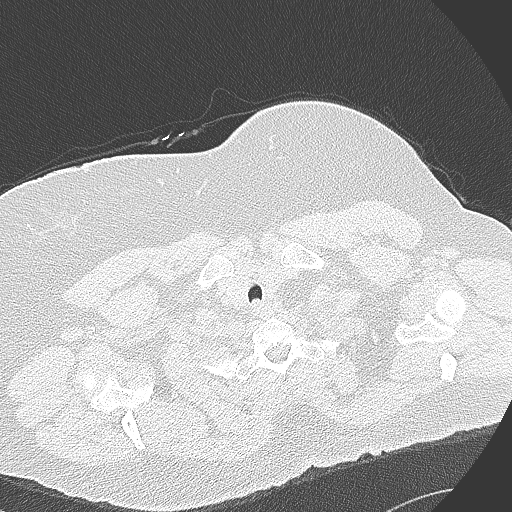

[Series 6: coronal · coronal · 0.51mm/px · 3 of 127 slices shown]
[im 26/127  lung]
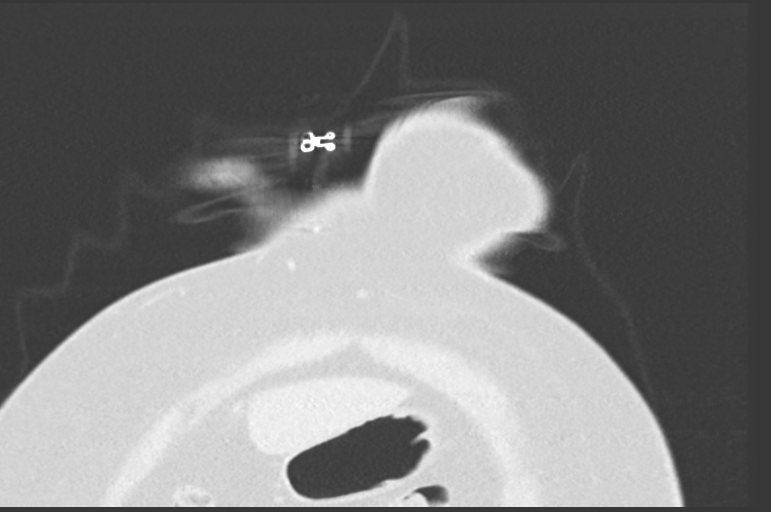
[im 51/127  lung]
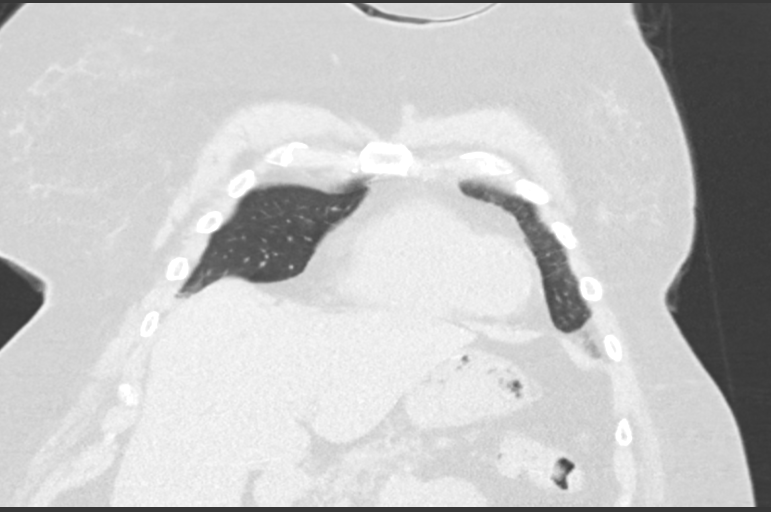
[im 76/127  lung]
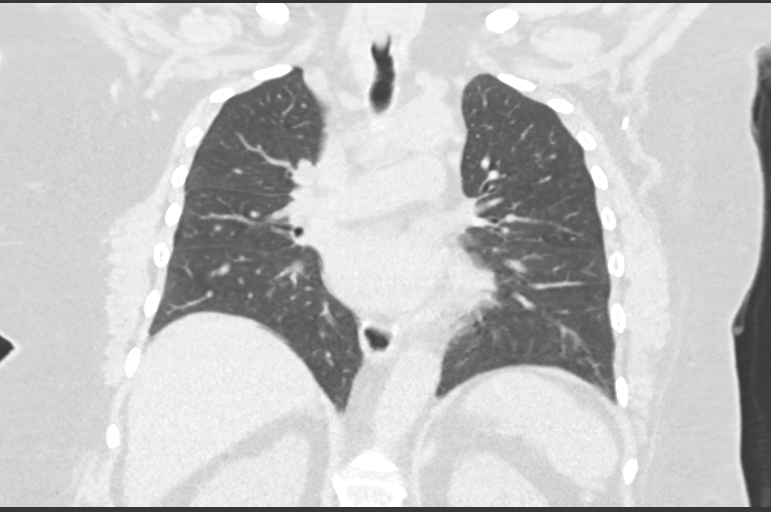

[14 of 36 positions shown; findings below may reference images not displayed]

FINDINGS: Cardiovascular: Incidental note of aberrant retroesophageal origin
of the right subclavian artery. Normal heart size. No pericardial
effusion.

Mediastinum/Nodes: No enlarged mediastinal, hilar, or axillary lymph
nodes. Surgical clips in the left axilla. Thyroid gland, trachea,
and esophagus demonstrate no significant findings.

Lungs/Pleura: Multiple stable small pulmonary nodules, including a 7
mm nodule of the lingula (series 4, image 152), a 4 mm nodule of the
medial right pulmonary apex (series 4, image 43), and a 5 mm nodule
of the dependent left lower lobe (series 4, image 144). A previously
queried nodule of the dependent right lower lobe is resolved,
consistent with resolution of atelectasis and or
infection/inflammation. No pleural effusion or pneumothorax.

Upper Abdomen: No acute abnormality.

Musculoskeletal: No chest wall mass or suspicious bone lesions
identified.
IMPRESSION: 1. A previously queried nodule of the dependent right lower lobe is
resolved, consistent with resolution of atelectasis and/or
infection/inflammation.
2. Multiple stable small pulmonary nodules, the majority stable for
greater than 2 years and almost certainly definitively benign
sequelae of infection or inflammation.
3. Stable 5 mm nodule of the dependent left lower lobe, which as
noted on prior examination was slightly increased in size in the
interval from 02/12/2021 02/19/2020, but remains most likely
infectious or inflammatory in nature.
4. Additional CT follow-up for the small pulmonary nodules if
indicated by clinical oncologic protocol given history of breast
cancer.

## 2020-05-18 ENCOUNTER — Other Ambulatory Visit: Payer: Self-pay

## 2020-05-18 DIAGNOSIS — C50412 Malignant neoplasm of upper-outer quadrant of left female breast: Secondary | ICD-10-CM

## 2020-05-18 DIAGNOSIS — Z170 Estrogen receptor positive status [ER+]: Secondary | ICD-10-CM

## 2020-05-18 DIAGNOSIS — R918 Other nonspecific abnormal finding of lung field: Secondary | ICD-10-CM

## 2020-05-30 NOTE — Progress Notes (Signed)
HPI F never smoker followed for OSA,, Asthma, allergic rhinitis,  lung nodules, complicated by DM2, hx Breast cancer L in remission, HBP HST 01/30/2019- AHI 23.7/ hr, desaturation to 71%, body weight 293 lbs NPSG 07/23/2016- AAHI 116.6/ hr, desaturation to 72%   ------------------------------------------------------------------------------------    06/01/2019-  Virtual Visit via Telephone Note  History of Present Illness: 89 yoF never smoker followed for OSA,, Asthma, lung nodules, complicated by DM2, hx Breast cancer L in remission, HBP Anoro, Singulair,  CPAP 5-15/ [facility] Download compliance 70%, AHI 3.6/ hr If up to bathroom by 4:30, she won't put CPAP back on for the rest of her time in bed. Coughing- feels irritation in throat. Dry. Tessalon helps. + GERD , no post nasal drip. Had glaucoma surgery. Lung nodule, stable on CT, was followed by Oncology.  Observations/Objective:   Assessment and Plan: OSA- meeting goals. Continue 5-15 GERD- Use Pepcid daily, raise HOB,  Lung Nodule- f/u CT 9/21 Follow Up Instructions: 1 year    05/31/20- 59 yoF never smoker followed for OSA,, Asthma, lung nodules, complicated by DM2, hx Breast cancer L in remission, HBP, GERD Anoro, Singulair,  CPAP 5-15/ [facility]   Not using Download- compliance 10%, AHI 5.4/ hr Body weight today-290 lbs Covid vax- 2 Phizer Flu vax- had On lisinopril- note dry cough Denies daytime sleepiness.  CPAP bothered with dry throat/ mucus. Discussed CT - stable nodules. Agreed to repeat in 6 months w hx breast cancer. HRCT chest 05/16/20- IMPRESSION: 1. A previously queried nodule of the dependent right lower lobe is resolved, consistent with resolution of atelectasis and/or infection/inflammation. 2. Multiple stable small pulmonary nodules, the majority stable for greater than 2 years and almost certainly definitively benign sequelae of infection or inflammation. 3. Stable 5 mm nodule of the dependent left lower  lobe, which as noted on prior examination was slightly increased in size in the interval from 02/12/2021 02/19/2020, but remains most likely infectious or inflammatory in nature. 4. Additional CT follow-up for the small pulmonary nodules if indicated by clinical oncologic protocol given history of breast cancer.   ROS-see HPI   + = positive Constitutional:    weight loss, night sweats, fevers, chills, fatigue, lassitude. HEENT:    headaches, +difficulty swallowing, +tooth/dental problems, sore throat,       +sneezing,+ itching,+ ear ache, nasal congestion, post nasal drip, snoring CV:    chest pain, orthopnea, PND, +swelling in lower extremities, anasarca,                                   dizziness, palpitations Resp:   +shortness of breath with exertion or at rest.                productive cough,   non-productive cough, coughing up of blood.              change in color of mucus.  wheezing.   Skin:    rash or lesions. GI:  No-   heartburn, indigestion, +abdominal pain, nausea, vomiting, diarrhea,                 change in bowel habits, loss of appetite GU: dysuria, change in color of urine, no urgency or frequency.   flank pain. MS:   +joint pain, stiffness, decreased range of motion, back pain. Neuro-     nothing unusual Psych:  change in mood or affect.  depression or +anxiety.  memory loss.  OBJ- Physical Exam General- Alert, Oriented, Affect-appropriate, Distress- none acute, + morbidly obese Skin- rash-none, lesions- none, excoriation- none Lymphadenopathy- none Head- atraumatic            Eyes- Gross vision intact, PERRLA, conjunctivae and secretions clear            Ears- Hearing, canals-normal            Nose- Clear, no-Septal dev, mucus, polyps, erosion, perforation             Throat- Mallampati III , mucosa clear , drainage- none, tonsils- atrophic, + teeth Neck- flexible , trachea midline, no stridor , thyroid nl, carotid no bruit Chest - symmetrical excursion ,  unlabored           Heart/CV- RRR , no murmur , no gallop  , no rub, nl s1 s2                           - JVD- none , edema- none, stasis changes- none, varices- none           Lung- clear to P&A, wheeze- none, cough- none , dullness-none, rub- none           Chest wall- +Scar from port access Abd-  Br/ Gen/ Rectal- Not done, not indicated Extrem- cyanosis- none, clubbing, none, atrophy- none, strength- nl, + cane Neuro- + seemed tired

## 2020-05-31 ENCOUNTER — Encounter: Payer: Self-pay | Admitting: Internal Medicine

## 2020-05-31 ENCOUNTER — Other Ambulatory Visit: Payer: Self-pay

## 2020-05-31 ENCOUNTER — Ambulatory Visit (INDEPENDENT_AMBULATORY_CARE_PROVIDER_SITE_OTHER): Payer: Medicare HMO | Admitting: Internal Medicine

## 2020-05-31 VITALS — BP 112/72 | HR 105 | Temp 98.0°F | Ht 63.0 in | Wt 290.8 lb

## 2020-05-31 DIAGNOSIS — R918 Other nonspecific abnormal finding of lung field: Secondary | ICD-10-CM | POA: Diagnosis not present

## 2020-05-31 DIAGNOSIS — C50412 Malignant neoplasm of upper-outer quadrant of left female breast: Secondary | ICD-10-CM

## 2020-05-31 DIAGNOSIS — Z6841 Body Mass Index (BMI) 40.0 and over, adult: Secondary | ICD-10-CM

## 2020-05-31 DIAGNOSIS — R911 Solitary pulmonary nodule: Secondary | ICD-10-CM

## 2020-05-31 DIAGNOSIS — Z170 Estrogen receptor positive status [ER+]: Secondary | ICD-10-CM

## 2020-05-31 DIAGNOSIS — G4733 Obstructive sleep apnea (adult) (pediatric): Secondary | ICD-10-CM | POA: Diagnosis not present

## 2020-05-31 NOTE — Patient Instructions (Signed)
Order- future CT chest no contrast 6 months   Dx lung nodules, hx breast cancer  I think the dry throat complaint might be coming from your lisinopril medicine. You can ask the doctor who gives that to you about trying something else.  You can also try sucking on a diabetic sugar free hard candy to soothe your throat.  I think your weight is affecting your breathing. Now the New Year is a good time to start eating less and walking more to lose some weight.  I hope you will decide to go back on your CPAP. It was helping you to keep your oxygen up while you sleep.Marland Kitchen

## 2020-06-07 ENCOUNTER — Encounter: Payer: Self-pay | Admitting: Nurse Practitioner

## 2020-06-07 ENCOUNTER — Telehealth: Payer: Self-pay | Admitting: Nurse Practitioner

## 2020-06-07 DIAGNOSIS — E119 Type 2 diabetes mellitus without complications: Secondary | ICD-10-CM | POA: Insufficient documentation

## 2020-06-07 NOTE — Telephone Encounter (Signed)
Called to discuss with patient about Covid symptoms and the use of a monoclonal antibody infusion for those with mild to moderate Covid symptoms and at a high risk of hospitalization.   Pt is qualified for the monoclonal antibody infusion, however, she hung up on me before we could discuss treatment options.  Nicolasa Ducking, NP  06/07/2020 2:50 PM

## 2020-06-13 ENCOUNTER — Ambulatory Visit: Payer: Medicare HMO

## 2020-06-17 DIAGNOSIS — G4733 Obstructive sleep apnea (adult) (pediatric): Secondary | ICD-10-CM | POA: Diagnosis not present

## 2020-07-13 DIAGNOSIS — E559 Vitamin D deficiency, unspecified: Secondary | ICD-10-CM | POA: Diagnosis not present

## 2020-07-13 DIAGNOSIS — K219 Gastro-esophageal reflux disease without esophagitis: Secondary | ICD-10-CM | POA: Diagnosis not present

## 2020-07-13 DIAGNOSIS — E782 Mixed hyperlipidemia: Secondary | ICD-10-CM | POA: Diagnosis not present

## 2020-07-13 DIAGNOSIS — E539 Vitamin B deficiency, unspecified: Secondary | ICD-10-CM | POA: Diagnosis not present

## 2020-07-13 DIAGNOSIS — M542 Cervicalgia: Secondary | ICD-10-CM | POA: Diagnosis not present

## 2020-07-13 DIAGNOSIS — R69 Illness, unspecified: Secondary | ICD-10-CM | POA: Diagnosis not present

## 2020-07-13 DIAGNOSIS — I10 Essential (primary) hypertension: Secondary | ICD-10-CM | POA: Diagnosis not present

## 2020-07-13 DIAGNOSIS — J449 Chronic obstructive pulmonary disease, unspecified: Secondary | ICD-10-CM | POA: Diagnosis not present

## 2020-07-13 DIAGNOSIS — E1165 Type 2 diabetes mellitus with hyperglycemia: Secondary | ICD-10-CM | POA: Diagnosis not present

## 2020-07-13 DIAGNOSIS — E1142 Type 2 diabetes mellitus with diabetic polyneuropathy: Secondary | ICD-10-CM | POA: Diagnosis not present

## 2020-07-19 DIAGNOSIS — E559 Vitamin D deficiency, unspecified: Secondary | ICD-10-CM | POA: Diagnosis not present

## 2020-07-19 DIAGNOSIS — E1121 Type 2 diabetes mellitus with diabetic nephropathy: Secondary | ICD-10-CM | POA: Diagnosis not present

## 2020-07-19 DIAGNOSIS — E785 Hyperlipidemia, unspecified: Secondary | ICD-10-CM | POA: Diagnosis not present

## 2020-07-19 DIAGNOSIS — E539 Vitamin B deficiency, unspecified: Secondary | ICD-10-CM | POA: Diagnosis not present

## 2020-07-19 DIAGNOSIS — D519 Vitamin B12 deficiency anemia, unspecified: Secondary | ICD-10-CM | POA: Diagnosis not present

## 2020-07-19 DIAGNOSIS — I10 Essential (primary) hypertension: Secondary | ICD-10-CM | POA: Diagnosis not present

## 2020-07-21 ENCOUNTER — Encounter: Payer: Self-pay | Admitting: Internal Medicine

## 2020-07-21 ENCOUNTER — Encounter: Payer: Self-pay | Admitting: Sports Medicine

## 2020-07-21 ENCOUNTER — Other Ambulatory Visit: Payer: Self-pay

## 2020-07-21 ENCOUNTER — Ambulatory Visit (INDEPENDENT_AMBULATORY_CARE_PROVIDER_SITE_OTHER): Payer: Medicare HMO | Admitting: Sports Medicine

## 2020-07-21 DIAGNOSIS — E1149 Type 2 diabetes mellitus with other diabetic neurological complication: Secondary | ICD-10-CM | POA: Diagnosis not present

## 2020-07-21 DIAGNOSIS — M79675 Pain in left toe(s): Secondary | ICD-10-CM

## 2020-07-21 DIAGNOSIS — M7741 Metatarsalgia, right foot: Secondary | ICD-10-CM

## 2020-07-21 DIAGNOSIS — M7742 Metatarsalgia, left foot: Secondary | ICD-10-CM | POA: Diagnosis not present

## 2020-07-21 DIAGNOSIS — B351 Tinea unguium: Secondary | ICD-10-CM

## 2020-07-21 DIAGNOSIS — M216X2 Other acquired deformities of left foot: Secondary | ICD-10-CM

## 2020-07-21 DIAGNOSIS — M216X1 Other acquired deformities of right foot: Secondary | ICD-10-CM | POA: Diagnosis not present

## 2020-07-21 DIAGNOSIS — M79674 Pain in right toe(s): Secondary | ICD-10-CM | POA: Diagnosis not present

## 2020-07-21 NOTE — Progress Notes (Signed)
Subjective: Kelsey Wilson is a 60 y.o. female patient with history of diabetes who presents to office today complaining of long,mildly painful nails  while ambulating in shoes; unable to trim. Patient states that the glucose reading this morning was not recorded but last visit was doing good. Has some pains to balls of both feet and some pains to heels. Patient denies any new changes in medication or new problems except having some discomfort at balls and heels.   Patient Active Problem List   Diagnosis Date Noted  . Morbid obesity ([place])   . Type II diabetes mellitus ([place])   . GERD (gastroesophageal reflux disease) 06/21/2019  . Insomnia 03/03/2019  . Ankle arthritis 12/12/2018  . Muscle weakness 12/12/2018  . Obstructive sleep apnea 10/23/2018  . Asthmatic bronchitis 10/23/2018  . Cellulitis of internal cheek, left 01/23/2018  . Lung nodule 09/15/2017  . Morbid obesity with BMI of 45.0-49.9, adult ([place]) 09/05/2017  . Arthritis of carpometacarpal (CMC) joints of both thumbs 03/14/2017  . 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 Vertigo 10/12/2016  . Peripheral positional vertigo 08/28/2016  . Abnormal auditory perception of both ears 07/27/2016  . Neuropathic pain 06/25/2016  . 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 S/P lumbar spinal fusion 01/27/2016  . H/O therapeutic radiation 09/21/2015  . Personal history of breast cancer 09/21/2015  . Pes planus of both feet 08/04/2015  . Primary osteoarthritis of right knee 03/10/2015  . Osteoarthritis of right knee 02/04/2015  . Primary osteoarthritis of left knee 09/13/2014  . Hot flashes 01/19/2014  . Abdominal pain, unspecified site 01/19/2014  . Malignant neoplasm of upper-outer quadrant of left breast in female, estrogen receptor positive ([place]) 03/19/2013  . Lumbosacral spondylosis without myelopathy 12/30/2012  . Fibromyalgia syndrome 08/15/2012  . Peripheral neuropathy, toxic 08/15/2012  .  Lymphedema of arm - left 04/30/2012   Current Outpatient Medications on File Prior to Visit  Medication Sig Dispense Refill  . ACCU-CHEK AVIVA PLUS test strip TEST WHILE FASTING AND 2 HOURS AFTER SUPPER    . Accu-Chek Softclix Lancets lancets TEST AS DIRECTED AND 2 HOURS AFTER SUPPER    . B-D ULTRAFINE III SHORT PEN 31G X 8 MM MISC USE ONE NEEDLE D    . benzonatate (TESSALON) 100 MG capsule Take 2 capsules (200 mg total) by mouth 3 (three) times daily as needed for cough. 40 capsule 12  . cyclobenzaprine (FLEXERIL) 5 MG tablet TAKE 1 TABLET(5 MG) BY MOUTH TWICE DAILY AS NEEDED FOR MUSCLE SPASMS 60 tablet 3  . DULoxetine (CYMBALTA) 60 MG capsule Take 60 mg by mouth daily.     Marland Kitchen gabapentin (NEURONTIN) 300 MG capsule Take 1 capsule (300 mg total) by mouth 3 (three) times daily. 90 capsule 5  . glipiZIDE (GLUCOTROL) 5 MG tablet 10 mg.     . lisinopril (ZESTRIL) 5 MG tablet     . montelukast (SINGULAIR) 10 MG tablet Take 10 mg by mouth at bedtime.    . ondansetron (ZOFRAN) 4 MG tablet Take 4 mg by mouth every 8 (eight) hours as needed for nausea or vomiting.    . temazepam (RESTORIL) 15 MG capsule Take 1 capsule (15 mg total) by mouth at bedtime as needed for sleep. 30 capsule 5  . traMADol (ULTRAM) 50 MG tablet Take 1 tablet (50 mg total) by mouth every 6 (six) hours as needed. Do Not Fill Before 02/17/2020 120 tablet 5  . TRULICITY 3.29 JM/4.2AS SOPN SMARTSIG:0.5 Milliliter(s) SUB-Q Once a Week    .  umeclidinium-vilanterol (ANORO ELLIPTA) 62.5-25 MCG/INH AEPB Inhale 1 puff into the lungs daily. 1 each 12  . Valbenazine Tosylate 40 MG CAPS Take 40 mg by mouth daily.     No current facility-administered medications on file prior to visit.   Allergies  Allergen Reactions  . Aleve [Naproxen] Nausea Only  . Compazine [Prochlorperazine] Other (See Comments)    Numbness of face and  lips   . Hydrocodone Itching    High dose only  . Oxycodone Other (See Comments)    hallucinations   .  Penicillins Nausea Only and Other (See Comments)    Has patient had a PCN reaction causing immediate rash, facial/tongue/throat swelling, SOB or lightheadedness with hypotension: No Has patient had a PCN reaction causing severe rash involving mucus membranes or skin necrosis: No Has patient had a PCN reaction that required hospitalization No Has patient had a PCN reaction occurring within the last 10 years: No If all of the above answers are "NO", then may proceed with Cephalosporin use.    No results found for this or any previous visit (from the past 2160 hour(s)).  Objective: General: Patient is awake, alert, and oriented x 3 and in no acute distress.  Integument: Skin is warm, dry and supple bilateral. Nails are tender, long, thickened and  dystrophic with subungual debris, consistent with onychomycosis, 1-5 bilateral. No signs of infection. Old scar on left foot. No open lesions or preulcerative lesions present bilateral. Remaining integument unremarkable.  Vasculature:  Dorsalis Pedis pulse 1/4 bilateral. Posterior Tibial pulse  1/4 bilateral.  Capillary fill time <3 sec 1-5 bilateral. Positive hair growth to the level of the digits. Temperature gradient within normal limits. No varicosities present bilateral. No edema present bilateral.   Neurology: Subjective numbness to toes.   Musculoskeletal: Asymptomatic pes planus pedal deformities noted bilateral. Diffuse pain at balls and heels. Muscular strength 4/5 in all lower extremity muscular groups bilateral without pain on range of motion . No tenderness with calf compression bilateral.  Assessment and Plan: Problem List Items Addressed This Visit   None   Visit Diagnoses    Pain due to onychomycosis of toenails of both feet    -  Primary   Type II diabetes mellitus with neurological manifestations ([place])          -Examined patient. -Discussed and educated patient on diabetic foot care, especially with  regards to the  vascular, neurological and musculoskeletal systems.  -Stressed the importance of good glycemic control and the detriment of not  controlling glucose levels in relation to the foot. -Mechanically debrided all nails 1-5 bilateral using sterile nail nipper and filed with dremel without incident  -Added met pads to diabetic insoles -Recommend daily stretching  -Answered all patient questions -Patient to return  in 3 months for at risk foot care -Patient advised to call the office if any problems or questions arise in the meantime.  Landis Martins, DPM

## 2020-07-21 NOTE — Assessment & Plan Note (Signed)
Severely overweight. Realistically, she has to change the way she lives and eats. Plan- consider Healthy [facility] Well[facility]

## 2020-07-21 NOTE — Assessment & Plan Note (Signed)
She seemed tired today. I encouraged her to get back on CPAP. Discussion done.

## 2020-07-21 NOTE — Assessment & Plan Note (Signed)
Multiple small nodules Plan- f/u CT in 6 months

## 2020-07-25 ENCOUNTER — Ambulatory Visit: Payer: Medicare HMO

## 2020-07-25 ENCOUNTER — Other Ambulatory Visit: Payer: Self-pay

## 2020-07-25 ENCOUNTER — Ambulatory Visit
Admission: RE | Admit: 2020-07-25 | Discharge: 2020-07-25 | Disposition: A | Discharge status: Home / Self Care | Payer: Medicare HMO | Source: Ambulatory Visit | Attending: Nurse Practitioner | Admitting: Nurse Practitioner

## 2020-07-25 DIAGNOSIS — Z1231 Encounter for screening mammogram for malignant neoplasm of breast: Secondary | ICD-10-CM | POA: Diagnosis not present

## 2020-07-25 IMAGING — MG MM DIGITAL SCREENING BILAT W/ TOMO AND CAD
6 of 10 series · 6 of 30 positions shown · non-contrast
Comparison: Previous exam(s).

CLINICAL DATA: Screening.

EXAM:
DIGITAL SCREENING BILATERAL MAMMOGRAM WITH TOMOSYNTHESIS AND CAD
TECHNIQUE: Bilateral screening digital craniocaudal and mediolateral oblique
mammograms were obtained. Bilateral screening digital breast
tomosynthesis was performed. The images were evaluated with
computer-aided detection.

[L CC synth-2D]
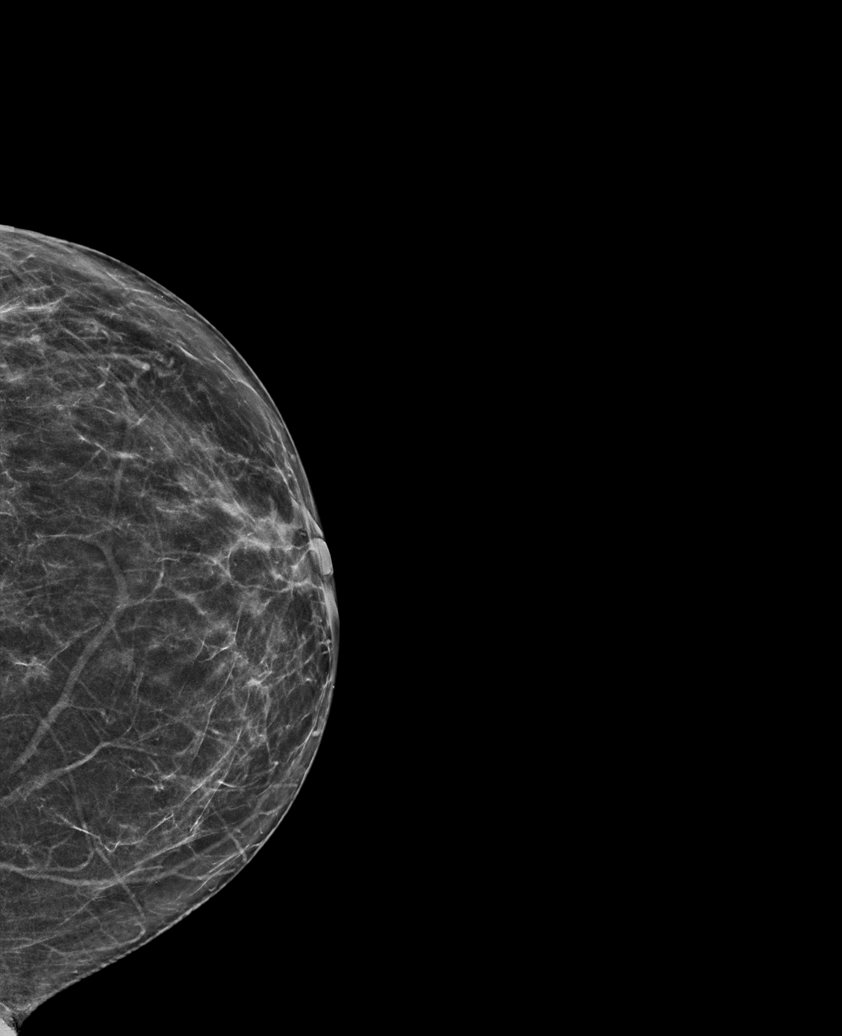

[R CC synth-2D]
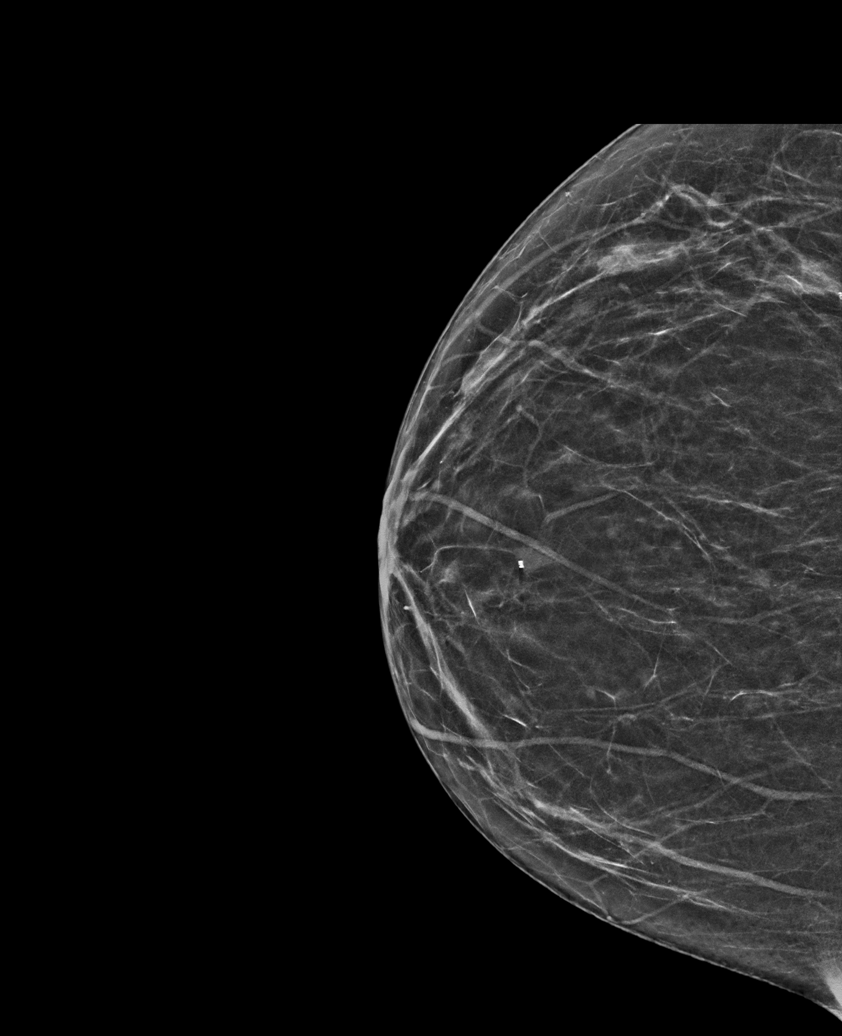

[L MLO synth-2D]
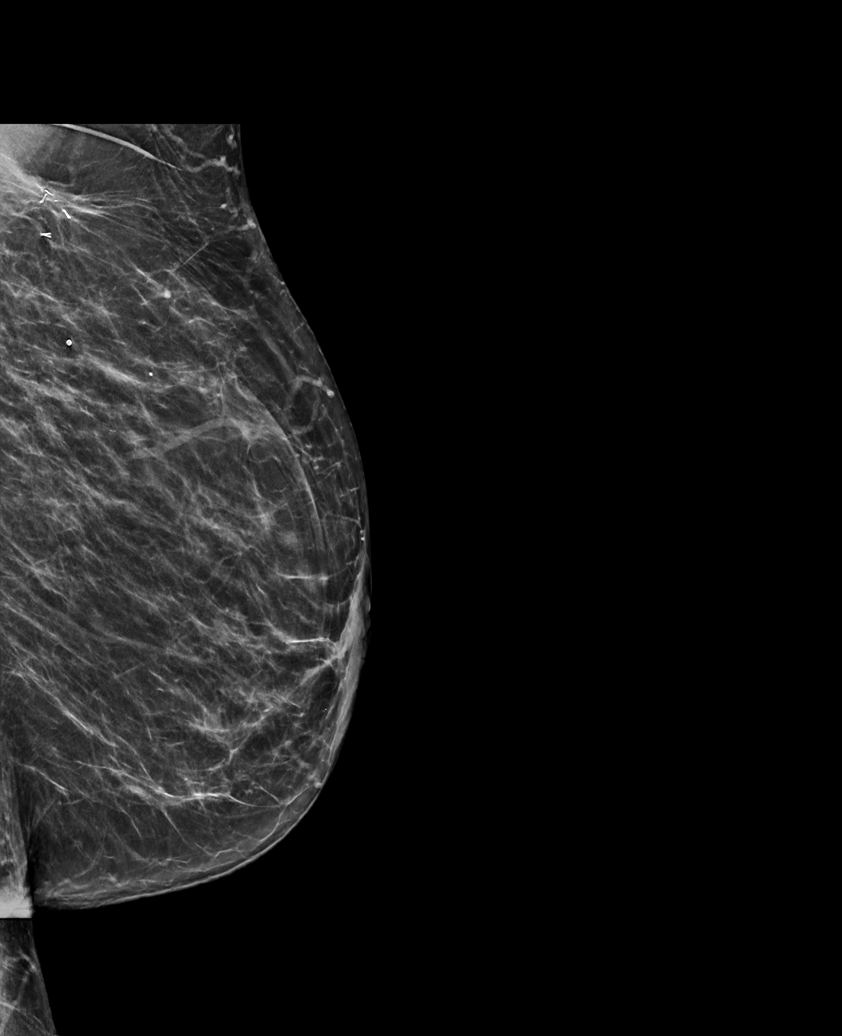

[L XCCL synth-2D]
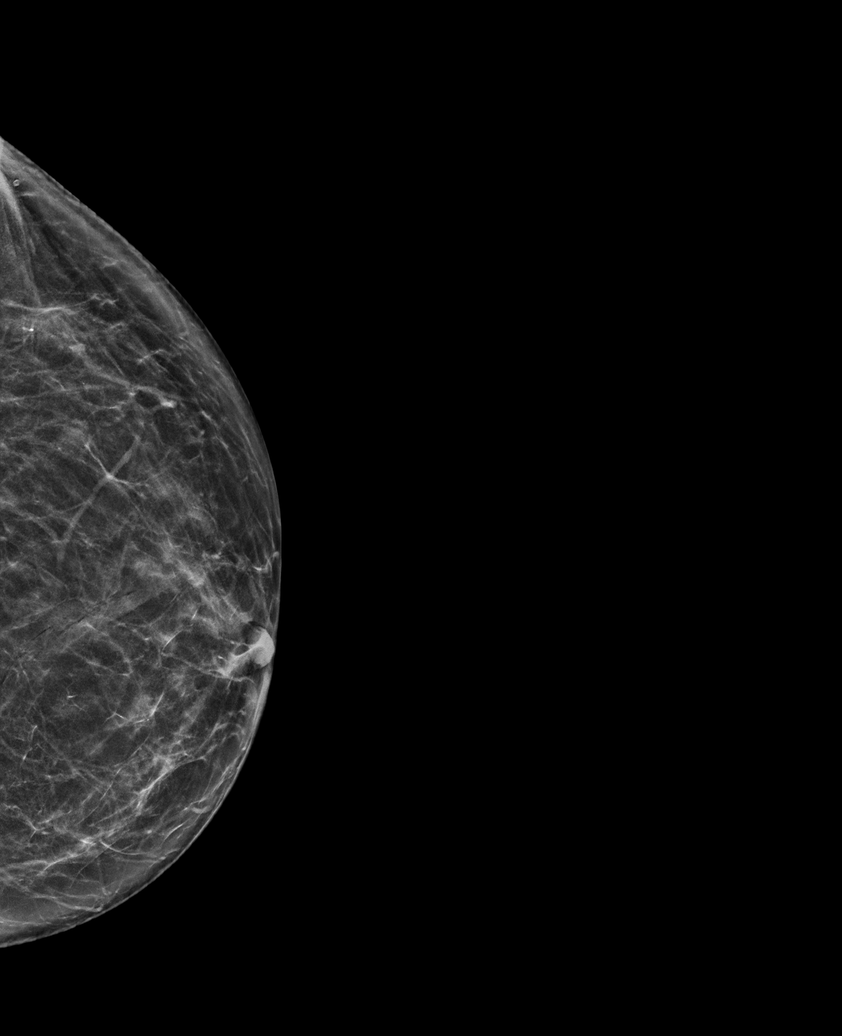

[R MLO synth-2D]
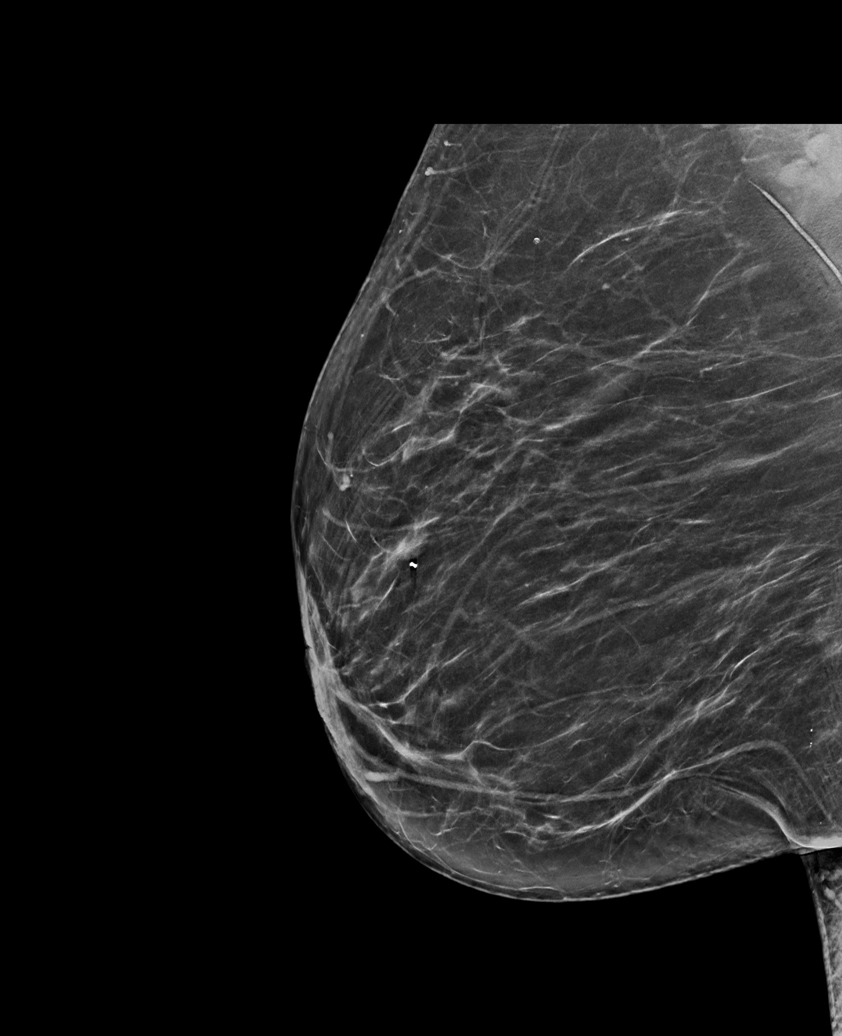

[R MLO tomo · tomo slice 39/76.0]
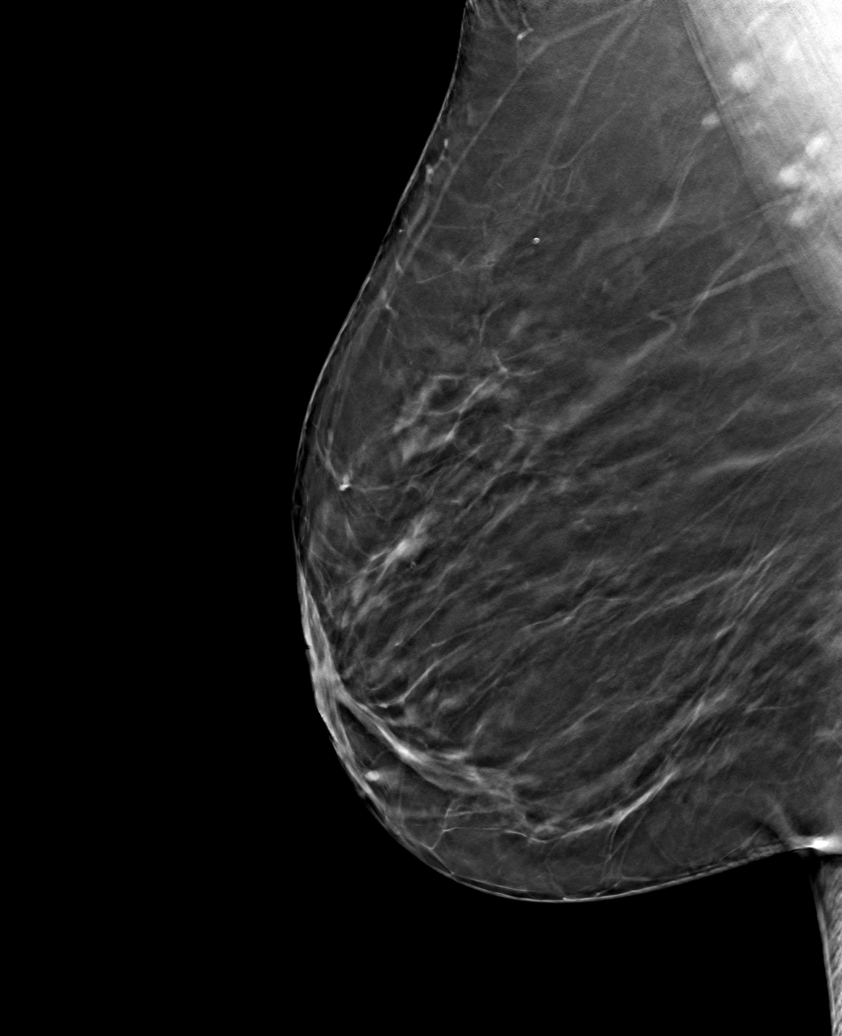

[6 of 30 positions shown; findings below may reference images not displayed]

ACR Breast Density Category b: There are scattered areas of
fibroglandular density.
FINDINGS: There are no findings suspicious for malignancy.
IMPRESSION: No mammographic evidence of malignancy. A result letter of this
screening mammogram will be mailed directly to the patient.

RECOMMENDATION:
Screening mammogram in one year. (Code:51-O-LD2)

BI-RADS CATEGORY  1: Negative.

## 2020-08-01 ENCOUNTER — Other Ambulatory Visit: Payer: Self-pay | Admitting: Registered Nurse

## 2020-08-11 ENCOUNTER — Encounter: Payer: Medicare HMO | Attending: Registered Nurse | Admitting: Registered Nurse

## 2020-08-11 ENCOUNTER — Encounter: Payer: Self-pay | Admitting: Registered Nurse

## 2020-08-11 ENCOUNTER — Other Ambulatory Visit: Payer: Self-pay

## 2020-08-11 VITALS — BP 119/83 | HR 87 | Temp 98.2°F | Ht 63.0 in | Wt 288.0 lb

## 2020-08-11 DIAGNOSIS — M47817 Spondylosis without myelopathy or radiculopathy, lumbosacral region: Secondary | ICD-10-CM | POA: Diagnosis not present

## 2020-08-11 DIAGNOSIS — M797 Fibromyalgia: Secondary | ICD-10-CM | POA: Diagnosis not present

## 2020-08-11 DIAGNOSIS — Z981 Arthrodesis status: Secondary | ICD-10-CM | POA: Diagnosis not present

## 2020-08-11 DIAGNOSIS — M792 Neuralgia and neuritis, unspecified: Secondary | ICD-10-CM | POA: Insufficient documentation

## 2020-08-11 DIAGNOSIS — G894 Chronic pain syndrome: Secondary | ICD-10-CM | POA: Insufficient documentation

## 2020-08-11 DIAGNOSIS — M961 Postlaminectomy syndrome, not elsewhere classified: Secondary | ICD-10-CM | POA: Insufficient documentation

## 2020-08-11 DIAGNOSIS — M1711 Unilateral primary osteoarthritis, right knee: Secondary | ICD-10-CM | POA: Insufficient documentation

## 2020-08-11 DIAGNOSIS — Z5181 Encounter for therapeutic drug level monitoring: Secondary | ICD-10-CM | POA: Insufficient documentation

## 2020-08-11 DIAGNOSIS — Z79891 Long term (current) use of opiate analgesic: Secondary | ICD-10-CM | POA: Diagnosis not present

## 2020-08-11 DIAGNOSIS — M1712 Unilateral primary osteoarthritis, left knee: Secondary | ICD-10-CM | POA: Diagnosis not present

## 2020-08-11 DIAGNOSIS — M7062 Trochanteric bursitis, left hip: Secondary | ICD-10-CM | POA: Insufficient documentation

## 2020-08-11 MED ORDER — TRAMADOL HCL 50 MG PO TABS
50.0000 mg | ORAL_TABLET | Freq: Four times a day (QID) | ORAL | 5 refills | Status: DC | PRN
Start: 2020-08-11 — End: 2021-02-16

## 2020-08-11 NOTE — Progress Notes (Signed)
Subjective:    Patient ID: Kelsey Wilson, female    DOB: [birth year], 61 y.o.   MRN: [id number]  HPI: Kelsey Wilson is a 60 y.o. female who returns for follow up appointment for chronic pain and medication refill. She states her pain is located in her bilateral fingers with tingling and burning, lower back pain, left hip and bilateral knee pain. She rates her pain 6. Her  current exercise regime is walking and performing stretching exercises.  Kelsey Wilson Morphine equivalent is 20.00  MME. She is also prescribed Alprazolam  by Selina Cooley. We have discussed the black box warning of using opioids and benzodiazepines. I highlighted the dangers of using these drugs together and discussed the adverse events including respiratory suppression, overdose, cognitive impairment and importance of compliance with current regimen. We will continue to monitor and adjust as indicated.   UDS ordered today.   Pain Inventory Average Pain 6 Pain Right Now 6 My pain is aching  In the last 24 hours, has pain interfered with the following? General activity 6 Relation with others 6 Enjoyment of life 6 What TIME of day is your pain at its worst? daytime Sleep (in general) Fair  Pain is worse with: walking, bending, sitting, inactivity and standing Pain improves with: rest, heat/ice and medication Relief from Meds: 1  Family History  Problem Relation Age of Onset  . Hypertension Mother   . Diabetes Mother   . Hypertension Father   . Diabetes Father   . Cancer Paternal Grandmother        unknown  . Breast cancer Paternal Aunt   . Hypertension Sister   . Hyperlipidemia Brother   . Colon cancer Neg Hx    Social History   Socioeconomic History  . Marital status: Single    Spouse name: Not on file  . Number of children: 3  . Years of education: Not on file  . Highest education level: Not on file  Occupational History  . Not on file  Tobacco Use  . Smoking status: Never Smoker  .  Smokeless tobacco: Never Used  Vaping Use  . Vaping Use: Never used  Substance and Sexual Activity  . Alcohol use: No    Alcohol/week: 0.0 standard drinks  . Drug use: No  . Sexual activity: Never    Birth control/protection: Post-menopausal, Surgical  Other Topics Concern  . Not on file  Social History Narrative  . Not on file   Social Determinants of Health   Financial Resource Strain: Not on file  Food Insecurity: Not on file  Transportation Needs: Not on file  Physical Activity: Not on file  Stress: Not on file  Social Connections: Not on file   Past Surgical History:  Procedure Laterality Date  . ABDOMINAL HYSTERECTOMY     still has ovaries  . APPENDECTOMY    . AXILLARY LYMPH NODE DISSECTION  11/28/2011   Procedure: AXILLARY LYMPH NODE DISSECTION;  Surgeon: Edward Jolly, MD;  Location: [facility];  Service: General;  Laterality: Left;  . BREAST LUMPECTOMY Left 11/28/2011   Malignant  . BREAST SURGERY Left 2013  . CARPAL TUNNEL RELEASE     Bilateral  . CESAREAN SECTION     pt. has had 3  . CHOLECYSTECTOMY    . COLONOSCOPY    . EYE SURGERY Bilateral    cataract removal  . KNEE SURGERY     Left Knee  . LAMINECTOMY WITH POSTERIOR LATERAL ARTHRODESIS LEVEL 1 N/A 11/29/2017  Procedure: Posterior Lateral Fusion - Lumbar Four-Lumbar Five, removal and replacement of hardware, Laminectomy - Lumbar Four-Lumbar Five;  Surgeon: Eustace Moore, MD;  Location: [facility] OR;  Service: Neurosurgery;  Laterality: N/A;  . LAMINECTOMY WITH POSTERIOR LATERAL ARTHRODESIS LEVEL 2 N/A 06/07/2017   Procedure: Posterior Lateral Fusion Lumbar Three-Four and Transforaminal Interbody Fusion Lumbar Four-Five with Segmental  Pedicle Screw Fixation;  Surgeon: Eustace Moore, MD;  Location: [facility];  Service: Neurosurgery;  Laterality: N/A;  Posterior Lateral Fusion Lumbar Three-Four and Transforaminal Interbody Fusion Lumbar Four-Five with Segmental  Pedicle Screw Fixation   . LUMBAR FUSION  06/07/2017    POST  . LUMBAR LAMINECTOMY/DECOMPRESSION MICRODISCECTOMY Bilateral 01/27/2016   Procedure: Laminectomy and Foraminotomy - Lumbar four -Lumbar five - bilateral- on-lay noninstrumented fusion;  Surgeon: Eustace Moore, MD;  Location: [facility];  Service: Neurosurgery;  Laterality: Bilateral;  . MULTIPLE EXTRACTIONS WITH ALVEOLOPLASTY N/A 05/11/2016   Procedure: EXTRACTION OF TEETH EIGHTEEN, TWENTY AND TWENTY- NINE;  REMOVAL OF MANDIBULAR TORUS AND EXOSTOSIS;  Surgeon: Diona Browner, DDS;  Location: [facility];  Service: Oral Surgery;  Laterality: N/A;  . PORTACATH PLACEMENT  06/18/2011   Procedure: INSERTION PORT-A-CATH;  Surgeon: Edward Jolly, MD;  Location: [facility];  Service: General;  Laterality: Right;  right subclavian  . removal portacath  2014  . spur     Apex spur on both big toes  . TOE SURGERY Bilateral   . TONSILLECTOMY    . TOTAL KNEE ARTHROPLASTY Left 09/13/2014  . TOTAL KNEE ARTHROPLASTY Left 09/13/2014   Procedure: LEFT TOTAL KNEE ARTHROPLASTY;  Surgeon: Rod Can, MD;  Location: [facility];  Service: Orthopedics;  Laterality: Left;  . TOTAL KNEE ARTHROPLASTY Right 03/10/2015   Procedure: RIGHT TOTAL KNEE ARTHROPLASTY;  Surgeon: Rod Can, MD;  Location: [facility];  Service: Orthopedics;  Laterality: Right;   Past Surgical History:  Procedure Laterality Date  . ABDOMINAL HYSTERECTOMY     still has ovaries  . APPENDECTOMY    . AXILLARY LYMPH NODE DISSECTION  11/28/2011   Procedure: AXILLARY LYMPH NODE DISSECTION;  Surgeon: Edward Jolly, MD;  Location: [facility];  Service: General;  Laterality: Left;  . BREAST LUMPECTOMY Left 11/28/2011   Malignant  . BREAST SURGERY Left 2013  . CARPAL TUNNEL RELEASE     Bilateral  . CESAREAN SECTION     pt. has had 3  . CHOLECYSTECTOMY    . COLONOSCOPY    . EYE SURGERY Bilateral    cataract removal  . KNEE SURGERY     Left Knee  . LAMINECTOMY WITH POSTERIOR LATERAL ARTHRODESIS LEVEL 1 N/A 11/29/2017   Procedure:  Posterior Lateral Fusion - Lumbar Four-Lumbar Five, removal and replacement of hardware, Laminectomy - Lumbar Four-Lumbar Five;  Surgeon: Eustace Moore, MD;  [facility];  Service: Neurosurgery;  Laterality: N/A;  . LAMINECTOMY WITH POSTERIOR LATERAL ARTHRODESIS LEVEL 2 N/A 06/07/2017   Procedure: Posterior Lateral Fusion Lumbar Three-Four and Transforaminal Interbody Fusion Lumbar Four-Five with Segmental  Pedicle Screw Fixation;  Surgeon: Eustace Moore, MD;  Location: [facility];  Service: Neurosurgery;  Laterality: N/A;  Posterior Lateral Fusion Lumbar Three-Four and Transforaminal Interbody Fusion Lumbar Four-Five with Segmental  Pedicle Screw Fixation   . LUMBAR FUSION  06/07/2017   POST  . LUMBAR LAMINECTOMY/DECOMPRESSION MICRODISCECTOMY Bilateral 01/27/2016   Procedure: Laminectomy and Foraminotomy - Lumbar four -Lumbar five - bilateral- on-lay noninstrumented fusion;  Surgeon: Eustace Moore, MD;  Location: [facility];  Service: Neurosurgery;  Laterality: Bilateral;  . MULTIPLE EXTRACTIONS WITH ALVEOLOPLASTY N/A 05/11/2016   Procedure: EXTRACTION OF TEETH EIGHTEEN, TWENTY AND TWENTY- NINE;  REMOVAL OF MANDIBULAR TORUS AND EXOSTOSIS;  Surgeon: Diona Browner, DDS;  Location: [facility];  Service: Oral Surgery;  Laterality: N/A;  . PORTACATH PLACEMENT  06/18/2011   Procedure: INSERTION PORT-A-CATH;  Surgeon: Edward Jolly, MD;  Location: [facility];  Service: General;  Laterality: Right;  right subclavian  . removal portacath  2014  . spur     Apex spur on both big toes  . TOE SURGERY Bilateral   . TONSILLECTOMY    . TOTAL KNEE ARTHROPLASTY Left 09/13/2014  . TOTAL KNEE ARTHROPLASTY Left 09/13/2014   Procedure: LEFT TOTAL KNEE ARTHROPLASTY;  Surgeon: Rod Can, MD;  Location: [facility];  Service: Orthopedics;  Laterality: Left;  . TOTAL KNEE ARTHROPLASTY Right 03/10/2015   Procedure: RIGHT TOTAL KNEE ARTHROPLASTY;  Surgeon: Rod Can, MD;  Location: [facility];  Service:  Orthopedics;  Laterality: Right;   Past Medical History:  Diagnosis Date  . Anemia   . Anxiety   . Arthritis   . Breast cancer ([place]) 2010   T3N1 invasive ductal carcinoma left breast.Takes Arimidex daily  . Bursitis   . Carpal tunnel syndrome   . Chronic back pain    stenosis  . Constipation    takes Colace daily  . Depression    takes Benzotropine daily  . Diverticulitis of colon   . Dyspnea    daily when walking for over 1 yr.  . Fibromyalgia 08/2012  . GERD (gastroesophageal reflux disease)    takes Dexilant daily  . Hemorrhoid   . History of blood transfusion    no abnormal reaction noted  . History of colon polyps    benign  . History of shingles   . Joint pain   . Joint swelling   . Morbid obesity ([place])   . Night muscle spasms    takes Flexeril nightly as needed  . Nocturia   . OSA (obstructive sleep apnea)   . OSA on CPAP   . Peripheral edema    takes Furosemide.Just started 01/18/16  . Peripheral neuropathy    takes Lyrica daily  . Personal history of chemotherapy    2013  . Personal history of radiation therapy    2013  . Pneumonia    hx of-2015  . Pseudoarthrosis of lumbar spine   . Seasonal allergies    takes Singulair nightly  . SOB (shortness of breath) on exertion    rarely with exertion  . 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 Alphonzo Grieve)  . Type II diabetes mellitus (HCC)    BP 119/83   Pulse 87   Temp 98.2 F (36.8 C)   Ht 5\' 3"  (1.6 m)   Wt 288 lb (130.6 kg)   SpO2 94%   BMI 51.02 kg/m   Opioid Risk Score:   Fall Risk Score:  `1  Depression screen PHQ 2/9  Depression screen [facility] 2/9 08/04/2018 06/06/2018 04/04/2018 01/13/2018 12/13/2017 06/21/2017 03/07/2017  Decreased Interest 0 0 0 3 3 1 2   Down, Depressed, Hopeless 0 0 0 3 3 1 2   PHQ - 2 Score 0 0 0 6 6 2 4   Altered sleeping - - - - - - 0  Tired, decreased energy - -  - - - - 3  Change in appetite - - - - - - 3  Feeling bad or failure about yourself  - - - - - - 2  Trouble concentrating - - - - - - 3  Moving slowly or fidgety/restless - - - - - - 3  Suicidal thoughts - - - - - - 0  PHQ-9 Score - - - - - - 18  Difficult doing work/chores - - - - - - Extremely dIfficult  Some recent data might be hidden    Review of Systems  Musculoskeletal: Positive for back pain.       Knee pain  All other systems reviewed and are negative.      Objective:   Physical Exam Vitals and nursing note reviewed.  Constitutional:      Appearance: Normal appearance. She is obese.  Cardiovascular:     Rate and Rhythm: Normal rate and regular rhythm.     Pulses: Normal pulses.     Heart sounds: Normal heart sounds.  Pulmonary:     Effort: Pulmonary effort is normal.     Breath sounds: Normal breath sounds.  Musculoskeletal:     Cervical back: Normal range of motion and neck supple.     Comments: Normal Muscle Bulk and Muscle Testing Reveals:  Upper Extremities: Full ROM and Muscle Strength 5/5  Lumbar Paraspinal Tenderness: L-4-L-5 Left Greater Trochanter Tenderness Lower Extremities: Decreased ROM and Muscle Strength 4/5 Arises from chair slowly  Antalgic Gait    Skin:    General: Skin is warm and dry.  Neurological:     Mental Status: She is alert and oriented to person, place, and time.  Psychiatric:        Mood and Affect: Mood normal.        Behavior: Behavior normal.           Assessment & Plan:  1. Chronic Sacroiliac Joint Pain: S/PBilateral Sacroilliac Injection with Dr. Berenice Bouton 06/23/2019. With good relief noted. 08/11/2020. 2.. Lumbar Postlaminectomy/ S/P Lumbar Fusion:Spinal Stenosis:S/PPosterior Lateral Fusion L-4-L-5 removal and replacement of hardware, Laminectomy L4-L5 by Dr. Ronnald Ramp on12/19/2019. Continue HEP as Tolerated and Continue current medicationregimen. 08/11/2020 Refill: Tramadol 50 mg one tablet every 6 hours as needed  for pain #120.  We will continue the opioid monitoring program, this consists of regular clinic visits, examinations, urine drug screen, pill counts as well as use of [place] Controlled Substance Reporting system. A 12 month History has been reviewed on the [facility] on 08/11/2020. 3. Lumbar Radiculitis:No complaints today.Continue current medication regime withGabapentin.08/11/2020 4. Fibromyalgia:ContinueHEP as Tolerated.Continue Current Medication Regimen withGabapentin.08/11/2020 5.LeftGreater Trochanter Bursitis:Continue to Alternate Ice and Heat Therapy.Continue to monitor. 08/11/2020. 6. Muscle Spasm:Continue:Flexeril.Continue to Monitor.08/11/2020. 7. Chronic Pain Syndrome: Continue Current medication regimen and HEP as Tolerated. Continue to Monitor.08/11/2020. 8. Bilateral OA of Bilateral Knees: Orthopedist following.Continue to Monitor.03/10/202 9. Bilateral Hand Pain:No complaints today.Continue to Monitor/ ? OA: Continue to Monitor.08/11/2020 10. Cervicalgia:No complaints Today.Continue to alternate Heat/Ice Therapy. Continue HEP as Tolerated.03/010/2022. 11. Chronic Bilateral Shoulders:Nocomplaints today.Continue to alternate Ice/ Heat therapy and HEP as Tolerated.08/11/2020. 12. Bilateral Ankle Pain: Podiatry Following:No complaints today.Continue to Monitor.08/11/2020 13. Bilateral Fingers with Neuropathic Pain: Contin Gabapentin. Educated on following diabetic diet and tight blood sugar control, she verbalizes understanding.   F/U in 6 months

## 2020-08-19 ENCOUNTER — Telehealth: Payer: Self-pay | Admitting: *Deleted

## 2020-08-19 LAB — TOXASSURE SELECT,+ANTIDEPR,UR

## 2020-08-19 NOTE — Telephone Encounter (Signed)
Urine drug screen for this encounter is consistent for prescribed medication 

## 2020-08-26 ENCOUNTER — Other Ambulatory Visit: Payer: Self-pay | Admitting: Registered Nurse

## 2020-09-16 DIAGNOSIS — G4733 Obstructive sleep apnea (adult) (pediatric): Secondary | ICD-10-CM | POA: Diagnosis not present

## 2020-10-04 DIAGNOSIS — N951 Menopausal and female climacteric states: Secondary | ICD-10-CM | POA: Diagnosis not present

## 2020-10-04 DIAGNOSIS — R635 Abnormal weight gain: Secondary | ICD-10-CM | POA: Diagnosis not present

## 2020-10-06 DIAGNOSIS — R69 Illness, unspecified: Secondary | ICD-10-CM | POA: Diagnosis not present

## 2020-10-06 DIAGNOSIS — E1140 Type 2 diabetes mellitus with diabetic neuropathy, unspecified: Secondary | ICD-10-CM | POA: Diagnosis not present

## 2020-10-06 DIAGNOSIS — R591 Generalized enlarged lymph nodes: Secondary | ICD-10-CM | POA: Diagnosis not present

## 2020-10-06 DIAGNOSIS — I10 Essential (primary) hypertension: Secondary | ICD-10-CM | POA: Diagnosis not present

## 2020-10-06 DIAGNOSIS — Z1339 Encounter for screening examination for other mental health and behavioral disorders: Secondary | ICD-10-CM | POA: Diagnosis not present

## 2020-10-06 DIAGNOSIS — C50919 Malignant neoplasm of unspecified site of unspecified female breast: Secondary | ICD-10-CM | POA: Diagnosis not present

## 2020-10-06 DIAGNOSIS — Z1331 Encounter for screening for depression: Secondary | ICD-10-CM | POA: Diagnosis not present

## 2020-10-06 DIAGNOSIS — Z6841 Body Mass Index (BMI) 40.0 and over, adult: Secondary | ICD-10-CM | POA: Diagnosis not present

## 2020-10-08 DIAGNOSIS — E785 Hyperlipidemia, unspecified: Secondary | ICD-10-CM | POA: Diagnosis not present

## 2020-10-08 DIAGNOSIS — J45909 Unspecified asthma, uncomplicated: Secondary | ICD-10-CM | POA: Diagnosis not present

## 2020-10-08 DIAGNOSIS — G8929 Other chronic pain: Secondary | ICD-10-CM | POA: Diagnosis not present

## 2020-10-08 DIAGNOSIS — R69 Illness, unspecified: Secondary | ICD-10-CM | POA: Diagnosis not present

## 2020-10-08 DIAGNOSIS — K219 Gastro-esophageal reflux disease without esophagitis: Secondary | ICD-10-CM | POA: Diagnosis not present

## 2020-10-08 DIAGNOSIS — F419 Anxiety disorder, unspecified: Secondary | ICD-10-CM | POA: Diagnosis not present

## 2020-10-08 DIAGNOSIS — M1990 Unspecified osteoarthritis, unspecified site: Secondary | ICD-10-CM | POA: Diagnosis not present

## 2020-10-08 DIAGNOSIS — I10 Essential (primary) hypertension: Secondary | ICD-10-CM | POA: Diagnosis not present

## 2020-10-08 DIAGNOSIS — E1142 Type 2 diabetes mellitus with diabetic polyneuropathy: Secondary | ICD-10-CM | POA: Diagnosis not present

## 2020-10-11 DIAGNOSIS — G250 Essential tremor: Secondary | ICD-10-CM | POA: Diagnosis not present

## 2020-10-11 DIAGNOSIS — I10 Essential (primary) hypertension: Secondary | ICD-10-CM | POA: Diagnosis not present

## 2020-10-11 DIAGNOSIS — E1142 Type 2 diabetes mellitus with diabetic polyneuropathy: Secondary | ICD-10-CM | POA: Diagnosis not present

## 2020-10-11 DIAGNOSIS — K219 Gastro-esophageal reflux disease without esophagitis: Secondary | ICD-10-CM | POA: Diagnosis not present

## 2020-10-11 DIAGNOSIS — J45909 Unspecified asthma, uncomplicated: Secondary | ICD-10-CM | POA: Diagnosis not present

## 2020-10-11 DIAGNOSIS — Z6841 Body Mass Index (BMI) 40.0 and over, adult: Secondary | ICD-10-CM | POA: Diagnosis not present

## 2020-10-11 DIAGNOSIS — R69 Illness, unspecified: Secondary | ICD-10-CM | POA: Diagnosis not present

## 2020-10-11 DIAGNOSIS — Z713 Dietary counseling and surveillance: Secondary | ICD-10-CM | POA: Diagnosis not present

## 2020-10-11 DIAGNOSIS — G5682 Other specified mononeuropathies of left upper limb: Secondary | ICD-10-CM | POA: Diagnosis not present

## 2020-10-14 DIAGNOSIS — E1140 Type 2 diabetes mellitus with diabetic neuropathy, unspecified: Secondary | ICD-10-CM | POA: Diagnosis not present

## 2020-10-14 DIAGNOSIS — Z6841 Body Mass Index (BMI) 40.0 and over, adult: Secondary | ICD-10-CM | POA: Diagnosis not present

## 2020-10-20 ENCOUNTER — Ambulatory Visit (INDEPENDENT_AMBULATORY_CARE_PROVIDER_SITE_OTHER): Payer: Medicare HMO | Admitting: Sports Medicine

## 2020-10-20 ENCOUNTER — Other Ambulatory Visit: Payer: Self-pay

## 2020-10-20 ENCOUNTER — Encounter: Payer: Self-pay | Admitting: Sports Medicine

## 2020-10-20 DIAGNOSIS — M79675 Pain in left toe(s): Secondary | ICD-10-CM | POA: Diagnosis not present

## 2020-10-20 DIAGNOSIS — M79674 Pain in right toe(s): Secondary | ICD-10-CM | POA: Diagnosis not present

## 2020-10-20 DIAGNOSIS — E1149 Type 2 diabetes mellitus with other diabetic neurological complication: Secondary | ICD-10-CM

## 2020-10-20 DIAGNOSIS — B351 Tinea unguium: Secondary | ICD-10-CM | POA: Diagnosis not present

## 2020-10-20 NOTE — Progress Notes (Signed)
Subjective: Kelsey Wilson is a 60 y.o. female patient with history of diabetes who presents to office today complaining of long,mildly painful nails  while ambulating in shoes; unable to trim. Patient states that the glucose reading this morning was not recorded but last visit was doing good. Saw PCP on 5/10.   Patient Active Problem List   Diagnosis Date Noted  . Morbid obesity ([place])   . Type II diabetes mellitus ([place])   . GERD (gastroesophageal reflux disease) 06/21/2019  . Insomnia 03/03/2019  . Ankle arthritis 12/12/2018  . Muscle weakness 12/12/2018  . Obstructive sleep apnea 10/23/2018  . Asthmatic bronchitis 10/23/2018  . Cellulitis of internal cheek, left 01/23/2018  . Lung nodule 09/15/2017  . Morbid obesity with BMI of 45.0-49.9, adult ([place]) 09/05/2017  . Arthritis of carpometacarpal (CMC) joints of both thumbs 03/14/2017  . 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 Vertigo 10/12/2016  . Peripheral positional vertigo 08/28/2016  . Abnormal auditory perception of both ears 07/27/2016  . Neuropathic pain 06/25/2016  . 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 S/P lumbar spinal fusion 01/27/2016  . H/O therapeutic radiation 09/21/2015  . Personal history of breast cancer 09/21/2015  . Pes planus of both feet 08/04/2015  . Primary osteoarthritis of right knee 03/10/2015  . Osteoarthritis of right knee 02/04/2015  . Primary osteoarthritis of left knee 09/13/2014  . Hot flashes 01/19/2014  . Abdominal pain, unspecified site 01/19/2014  . Malignant neoplasm of upper-outer quadrant of left breast in female, estrogen receptor positive ([place]) 03/19/2013  . Lumbosacral spondylosis without myelopathy 12/30/2012  . Fibromyalgia syndrome 08/15/2012  . Peripheral neuropathy, toxic 08/15/2012  . Lymphedema of arm - left 04/30/2012   Current Outpatient Medications on File Prior to Visit  Medication Sig Dispense Refill  . ACCU-CHEK AVIVA PLUS test strip  TEST WHILE FASTING AND 2 HOURS AFTER SUPPER    . Accu-Chek Softclix Lancets lancets TEST AS DIRECTED AND 2 HOURS AFTER SUPPER    . ALPRAZolam (XANAX) 0.5 MG tablet Take 0.5 mg by mouth daily as needed.    Marland Kitchen azithromycin (ZITHROMAX) 250 MG tablet Take 250 mg by mouth as directed.    . B-D ULTRAFINE III SHORT PEN 31G X 8 MM MISC USE ONE NEEDLE D    . benzonatate (TESSALON) 100 MG capsule Take 2 capsules (200 mg total) by mouth 3 (three) times daily as needed for cough. 40 capsule 12  . cyclobenzaprine (FLEXERIL) 5 MG tablet TAKE 1 TABLET BY MOUTH TWICE A DAY AS NEEDED FOR MUSCLE SPASMS 60 tablet 3  . DEXILANT 30 MG capsule Take 1 capsule by mouth daily.    . diclofenac (VOLTAREN) 75 MG EC tablet Take 75 mg by mouth 2 (two) times daily.    . DULoxetine (CYMBALTA) 60 MG capsule Take 60 mg by mouth daily.     . furosemide (LASIX) 20 MG tablet Take 20 mg by mouth daily.    Marland Kitchen gabapentin (NEURONTIN) 300 MG capsule TAKE 1 CAPSULE BY MOUTH THREE TIMES A DAY 90 capsule 5  . glipiZIDE (GLUCOTROL) 10 MG tablet Take 10 mg by mouth daily.    Marland Kitchen glipiZIDE (GLUCOTROL) 5 MG tablet 10 mg.     . lisinopril (ZESTRIL) 5 MG tablet     . metoprolol succinate (TOPROL-XL) 25 MG 24 hr tablet Take 12.5 mg by mouth daily.    . montelukast (SINGULAIR) 10 MG tablet Take 10 mg by mouth at bedtime.    . ondansetron (ZOFRAN) 4  MG tablet Take 4 mg by mouth every 8 (eight) hours as needed for nausea or vomiting.    . rosuvastatin (CRESTOR) 10 MG tablet Take 10 mg by mouth at bedtime.    . temazepam (RESTORIL) 15 MG capsule Take 1 capsule (15 mg total) by mouth at bedtime as needed for sleep. 30 capsule 5  . traMADol (ULTRAM) 50 MG tablet Take 1 tablet (50 mg total) by mouth every 6 (six) hours as needed. Do Not Fill Before 08/24/2019 120 tablet 5  . TRULICITY 4.33 IR/5.1OA SOPN SMARTSIG:0.5 Milliliter(s) SUB-Q Once a Week    . umeclidinium-vilanterol (ANORO ELLIPTA) 62.5-25 MCG/INH AEPB Inhale 1 puff into the lungs daily. 1 each  12  . Valbenazine Tosylate 40 MG CAPS Take 40 mg by mouth daily.     No current facility-administered medications on file prior to visit.   Allergies  Allergen Reactions  . Aleve [Naproxen] Nausea Only  . Compazine [Prochlorperazine] Other (See Comments)    Numbness of face and  lips   . Hydrocodone Itching    High dose only  . Oxycodone Other (See Comments)    hallucinations   . Penicillins Nausea Only and Other (See Comments)    Has patient had a PCN reaction causing immediate rash, facial/tongue/throat swelling, SOB or lightheadedness with hypotension: No Has patient had a PCN reaction causing severe rash involving mucus membranes or skin necrosis: No Has patient had a PCN reaction that required hospitalization No Has patient had a PCN reaction occurring within the last 10 years: No If all of the above answers are "NO", then may proceed with Cephalosporin use.    Recent Results (from the past 2160 hour(s))  ToxAssure Select,+Antidepr,UR     Status: None   Collection Time: 08/11/20 10:57 AM  Result Value Ref Range   Summary Note     Comment: ==================================================================== ToxAssure Select,+Antidepr,UR ==================================================================== Test                             Result       Flag       Units  Drug Present   Alprazolam                     28                      ng/mg creat   Alpha-hydroxyalprazolam        113                     ng/mg creat    Source of alprazolam is a scheduled prescription medication. Alpha-    hydroxyalprazolam is an expected metabolite of alprazolam.    Tramadol                       >6579                   ng/mg creat   O-Desmethyltramadol            >6579                   ng/mg creat   N-Desmethyltramadol            6199                    ng/mg creat    Source of tramadol is a prescription medication.  O-desmethyltramadol    and N-desmethyltramadol are expected  metabolites of tramadol.    Duloxetine                     PRESENT ==================================================================== Test                       Result    Flag   Units      Ref Range   Creatinine              76               mg/dL      >=20 ==================================================================== Declared Medications:  Medication list was not provided. ==================================================================== For clinical consultation, please call [phone]. ====================================================================     Objective: General: Patient is awake, alert, and oriented x 3 and in no acute distress.  Integument: Skin is warm, dry and supple bilateral. Nails are tender, long, thickened and  dystrophic with subungual debris, consistent with onychomycosis, 1-5 bilateral. No signs of infection. Old scar on left foot. No open lesions or preulcerative lesions present bilateral. Remaining integument unremarkable.  Vasculature:  Dorsalis Pedis pulse 1/4 bilateral. Posterior Tibial pulse  1/4 bilateral.  Capillary fill time <3 sec 1-5 bilateral. Positive hair growth to the level of the digits. Temperature gradient within normal limits. No varicosities present bilateral. No edema present bilateral.   Neurology: Subjective numbness to toes.   Musculoskeletal: Asymptomatic pes planus pedal deformities noted bilateral. Muscular strength 4/5 in all lower extremity muscular groups bilateral without pain on range of motion. No tenderness with calf compression bilateral.  Assessment and Plan: Problem List Items Addressed This Visit   None   Visit Diagnoses    Pain due to onychomycosis of toenails of both feet    -  Primary   Type II diabetes mellitus with neurological manifestations ([place])         -Examined patient. -Re-Discussed and educated patient on diabetic foot care, especially with  regards to the vascular, neurological and  musculoskeletal systems.  -Mechanically debrided all nails 1-5 bilateral using sterile nail nipper and filed with dremel without incident  -Recommend tea tree oil to nails -Answered all patient questions -Patient to return  in 2.5-3 months for at risk foot care -Patient advised to call the office if any problems or questions arise in the meantime.  Landis Martins, DPM

## 2020-10-24 DIAGNOSIS — R69 Illness, unspecified: Secondary | ICD-10-CM | POA: Diagnosis not present

## 2020-10-24 DIAGNOSIS — E1140 Type 2 diabetes mellitus with diabetic neuropathy, unspecified: Secondary | ICD-10-CM | POA: Diagnosis not present

## 2020-10-24 DIAGNOSIS — Z6841 Body Mass Index (BMI) 40.0 and over, adult: Secondary | ICD-10-CM | POA: Diagnosis not present

## 2020-10-24 DIAGNOSIS — I10 Essential (primary) hypertension: Secondary | ICD-10-CM | POA: Diagnosis not present

## 2020-11-02 DIAGNOSIS — Z6841 Body Mass Index (BMI) 40.0 and over, adult: Secondary | ICD-10-CM | POA: Diagnosis not present

## 2020-11-02 DIAGNOSIS — E1140 Type 2 diabetes mellitus with diabetic neuropathy, unspecified: Secondary | ICD-10-CM | POA: Diagnosis not present

## 2020-11-11 DIAGNOSIS — Z6841 Body Mass Index (BMI) 40.0 and over, adult: Secondary | ICD-10-CM | POA: Diagnosis not present

## 2020-11-11 DIAGNOSIS — I10 Essential (primary) hypertension: Secondary | ICD-10-CM | POA: Diagnosis not present

## 2020-11-11 DIAGNOSIS — M2550 Pain in unspecified joint: Secondary | ICD-10-CM | POA: Diagnosis not present

## 2020-11-14 DIAGNOSIS — G629 Polyneuropathy, unspecified: Secondary | ICD-10-CM | POA: Diagnosis not present

## 2020-11-14 DIAGNOSIS — R251 Tremor, unspecified: Secondary | ICD-10-CM | POA: Diagnosis not present

## 2020-11-16 ENCOUNTER — Ambulatory Visit (HOSPITAL_COMMUNITY)
Admission: RE | Admit: 2020-11-16 | Discharge: 2020-11-16 | Disposition: A | Discharge status: Home / Self Care | Payer: Medicare HMO | Source: Ambulatory Visit | Attending: Internal Medicine | Admitting: Internal Medicine

## 2020-11-16 ENCOUNTER — Other Ambulatory Visit: Payer: Self-pay

## 2020-11-16 DIAGNOSIS — Z853 Personal history of malignant neoplasm of breast: Secondary | ICD-10-CM | POA: Diagnosis not present

## 2020-11-16 DIAGNOSIS — R918 Other nonspecific abnormal finding of lung field: Secondary | ICD-10-CM | POA: Diagnosis not present

## 2020-11-16 DIAGNOSIS — Z170 Estrogen receptor positive status [ER+]: Secondary | ICD-10-CM | POA: Diagnosis not present

## 2020-11-16 DIAGNOSIS — Z85118 Personal history of other malignant neoplasm of bronchus and lung: Secondary | ICD-10-CM | POA: Diagnosis not present

## 2020-11-16 DIAGNOSIS — C50412 Malignant neoplasm of upper-outer quadrant of left female breast: Secondary | ICD-10-CM | POA: Diagnosis not present

## 2020-11-16 IMAGING — CT CT CHEST W/O CM
2 of 4 series · 15 of 36 positions shown, 18 images · non-contrast
Comparison: 05/16/2020

CLINICAL DATA: History of left breast cancer, lung nodules, status
post chemotherapy and XRT

EXAM:
CT CHEST WITHOUT CONTRAST
TECHNIQUE: Multidetector CT imaging of the chest was performed following the
standard protocol without IV contrast.

[Series 2: thorax · axial · 0.75mm/px · z∈[+1544,+1778]mm · 12 of 137 slices shown, 15 images]
[im 10/137  mediastinal]
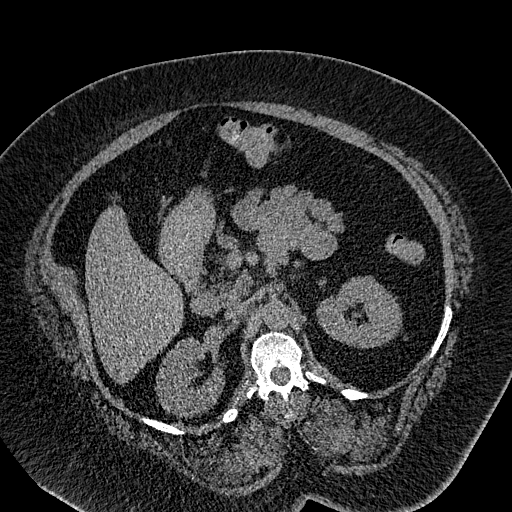
[im 10/137  lung]
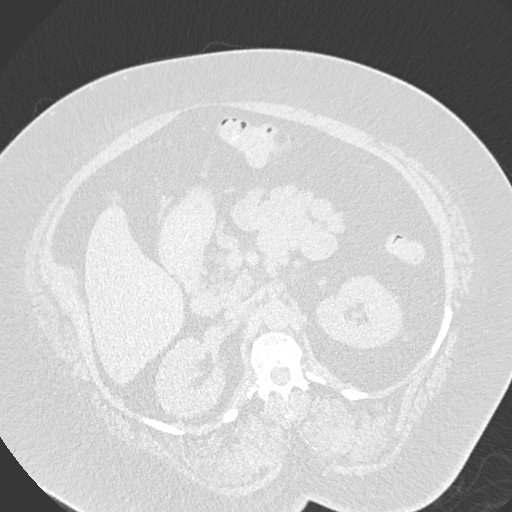
[im 19/137  lung]
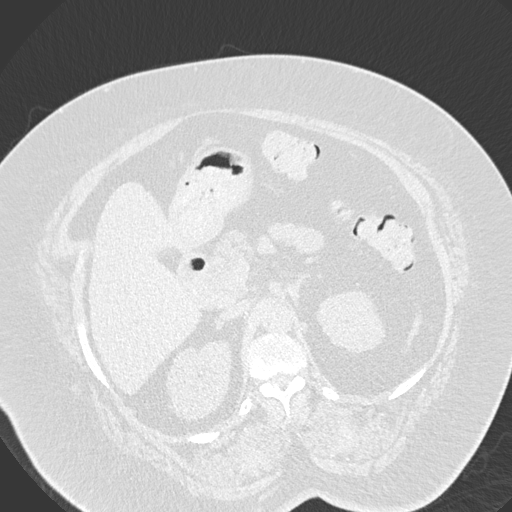
[im 28/137  lung]
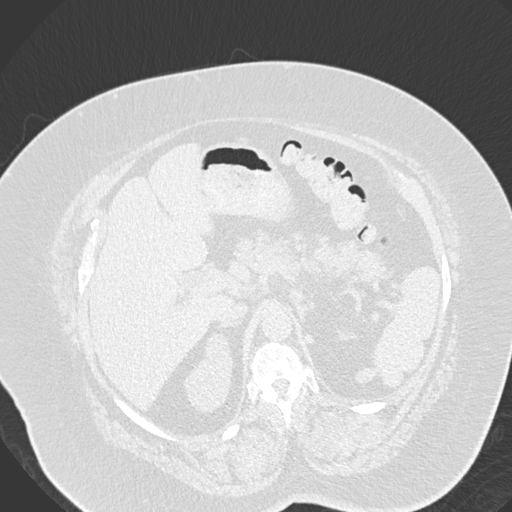
[im 46/137  lung]
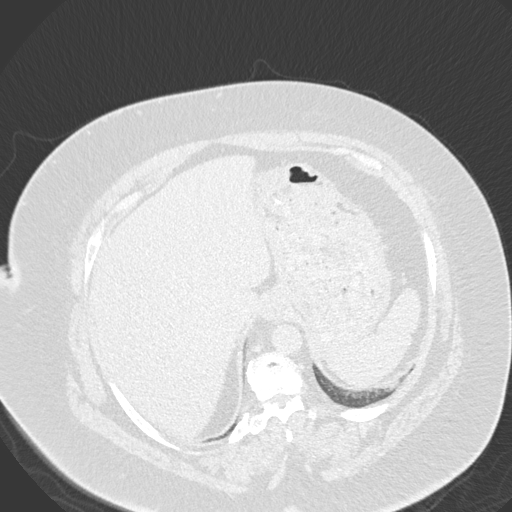
[im 55/137  mediastinal]
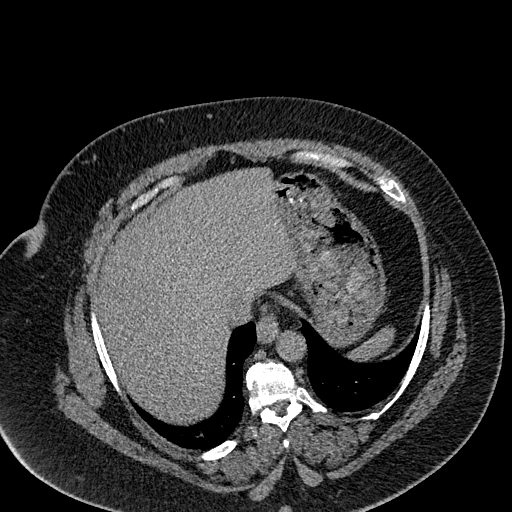
[im 55/137  lung]
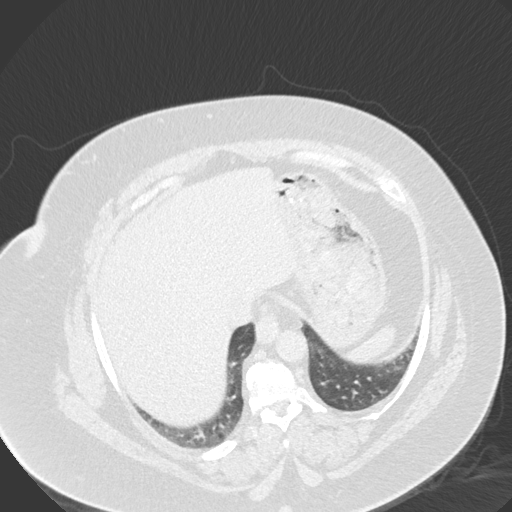
[im 64/137  lung]
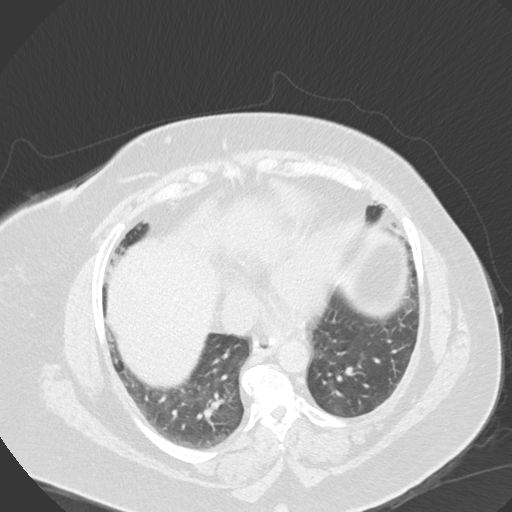
[im 73/137  lung]
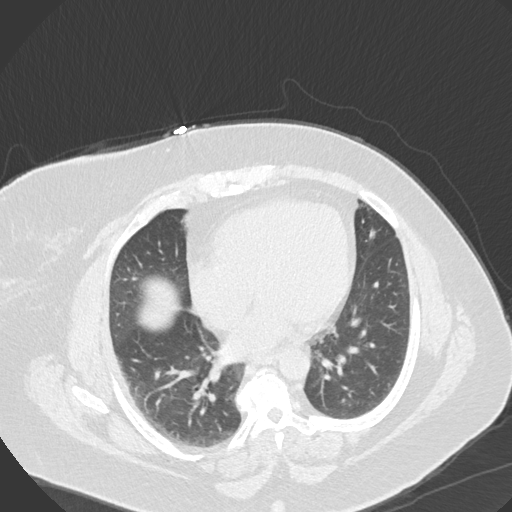
[im 82/137  lung]
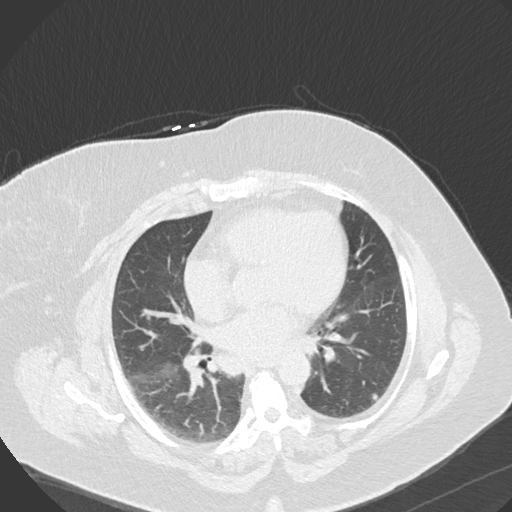
[im 91/137  mediastinal]
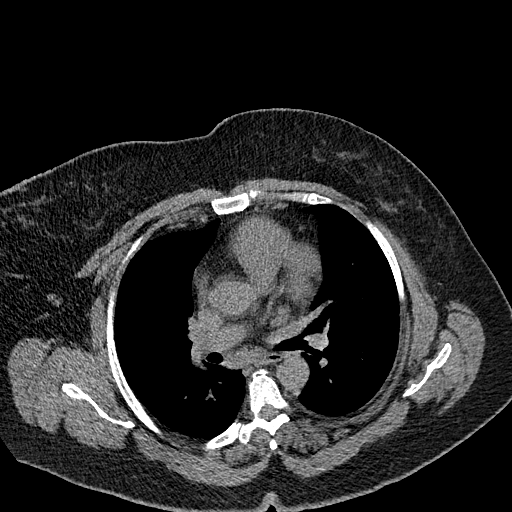
[im 91/137  lung]
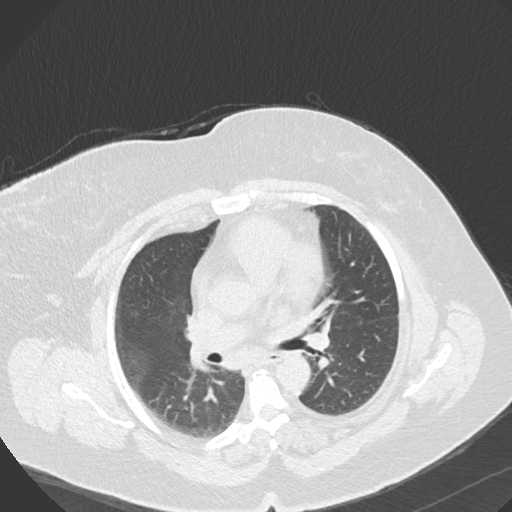
[im 109/137  lung]
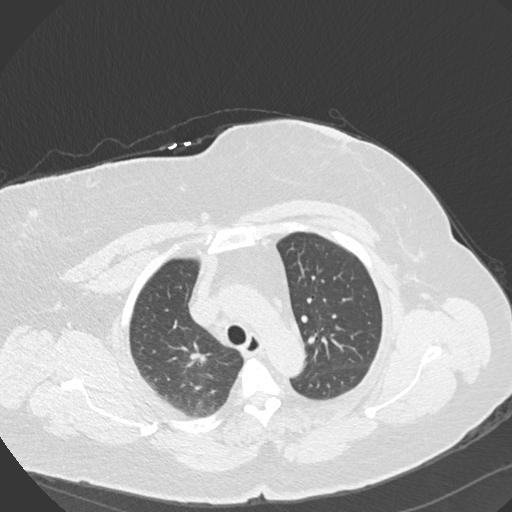
[im 118/137  lung]
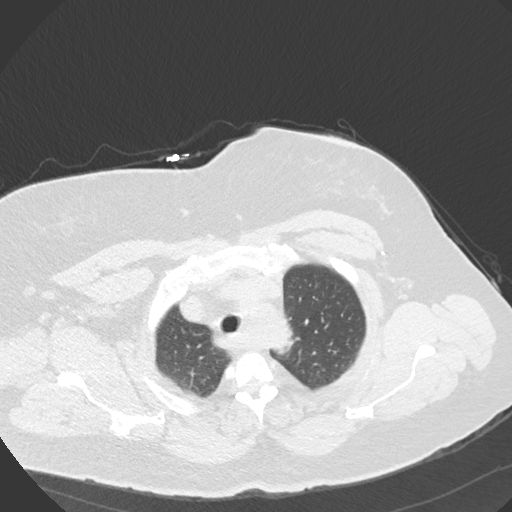
[im 127/137  lung]
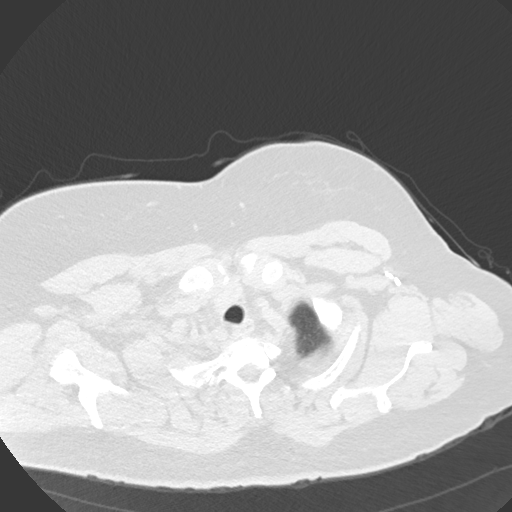

[Series 5: coronal · coronal · 0.67mm/px · 3 of 147 slices shown]
[im 30/147  lung]
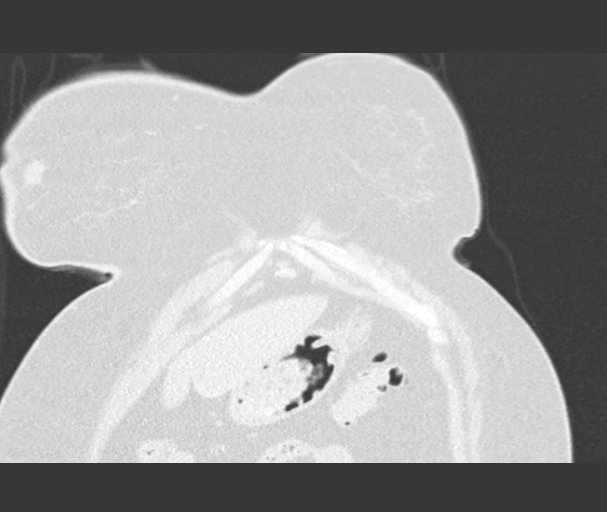
[im 59/147  lung]
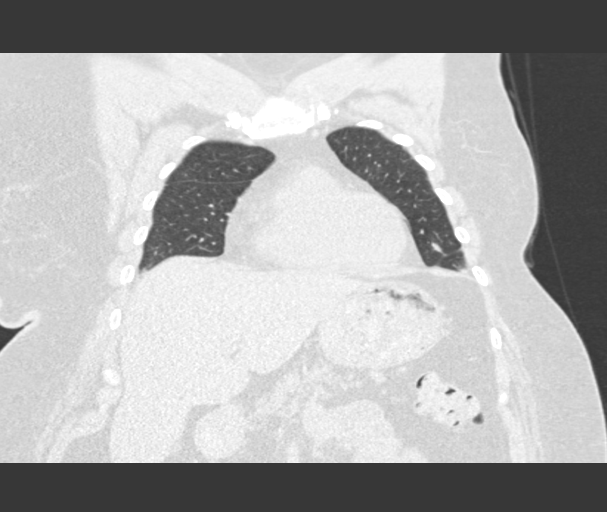
[im 88/147  lung]
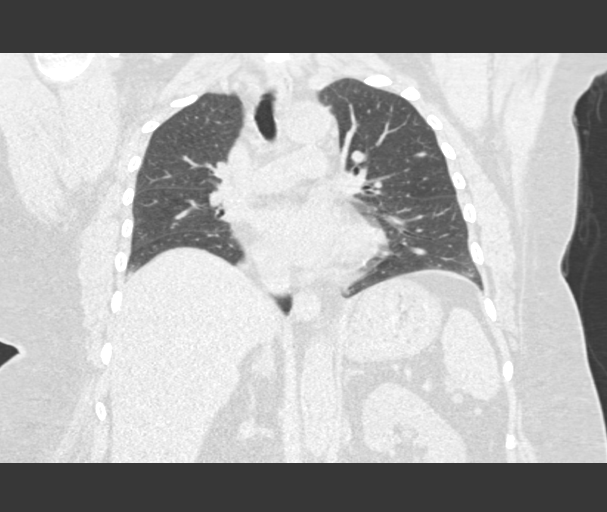

[15 of 36 positions shown; findings below may reference images not displayed]

FINDINGS: Cardiovascular: Incidental note of aberrant retroesophageal origin
of the right subclavian artery. Normal heart size. No pericardial
effusion.

Mediastinum/Nodes: No enlarged mediastinal, hilar, or axillary lymph
nodes. Thyroid gland, trachea, and esophagus demonstrate no
significant findings.

Lungs/Pleura: Occasional small pulmonary nodules are stable,
including a 7 mm nodule of the lingula (series 7, image 61), a 5 mm
nodule of the dependent left lung base (series 7, image 51), and a 4
mm nodule of the right pulmonary apex (series 7, image 13). No
pleural effusion or pneumothorax.

Upper Abdomen: No acute abnormality.

Musculoskeletal: No chest wall mass or suspicious bone lesions
identified. Postoperative findings of left lumpectomy and axillary
lymph node dissection
IMPRESSION: 1. Occasional small pulmonary nodules are stable and almost
certainly benign sequelae of prior infection or inflammation.
Additional follow-up only if indicated by clinical oncology imaging
protocol.
2. Postoperative findings of left lumpectomy and axillary lymph node
dissection.

## 2020-11-22 ENCOUNTER — Telehealth: Payer: Self-pay | Admitting: Registered Nurse

## 2020-11-22 MED ORDER — CYCLOBENZAPRINE HCL 5 MG PO TABS: ORAL_TABLET | ORAL | 3 refills | Status: DC

## 2020-11-22 NOTE — Telephone Encounter (Signed)
Flexeril ordered today.

## 2020-11-29 ENCOUNTER — Ambulatory Visit: Payer: Medicare HMO | Admitting: Internal Medicine

## 2020-11-30 ENCOUNTER — Ambulatory Visit (INDEPENDENT_AMBULATORY_CARE_PROVIDER_SITE_OTHER): Payer: Medicare HMO | Admitting: Nurse Practitioner

## 2020-11-30 ENCOUNTER — Other Ambulatory Visit (INDEPENDENT_AMBULATORY_CARE_PROVIDER_SITE_OTHER): Payer: Medicare HMO

## 2020-11-30 ENCOUNTER — Encounter: Payer: Self-pay | Admitting: Nurse Practitioner

## 2020-11-30 VITALS — BP 104/62 | HR 70 | Ht 62.0 in | Wt 284.0 lb

## 2020-11-30 DIAGNOSIS — R112 Nausea with vomiting, unspecified: Secondary | ICD-10-CM | POA: Diagnosis not present

## 2020-11-30 DIAGNOSIS — Z86010 Personal history of colonic polyps: Secondary | ICD-10-CM

## 2020-11-30 DIAGNOSIS — R1030 Lower abdominal pain, unspecified: Secondary | ICD-10-CM | POA: Diagnosis not present

## 2020-11-30 DIAGNOSIS — R1310 Dysphagia, unspecified: Secondary | ICD-10-CM

## 2020-11-30 DIAGNOSIS — R1010 Upper abdominal pain, unspecified: Secondary | ICD-10-CM

## 2020-11-30 LAB — COMPREHENSIVE METABOLIC PANEL
ALT: 15 U/L (ref 0–35)
AST: 18 U/L (ref 0–37)
Albumin: 3.9 g/dL (ref 3.5–5.2)
Alkaline Phosphatase: 71 U/L (ref 39–117)
BUN: 13 mg/dL (ref 6–23)
CO2: 31 mEq/L (ref 19–32)
Calcium: 9.3 mg/dL (ref 8.4–10.5)
Chloride: 102 mEq/L (ref 96–112)
Creatinine, Ser: 0.95 mg/dL (ref 0.40–1.20)
GFR: 64.51 mL/min (ref >60.00)
Glucose, Bld: 72 mg/dL (ref 70–99)
Potassium: 3.7 mEq/L (ref 3.5–5.1)
Sodium: 140 mEq/L (ref 135–145)
Total Bilirubin: 0.8 mg/dL (ref 0.2–1.2)
Total Protein: 7.1 g/dL (ref 6.0–8.3)

## 2020-11-30 LAB — CBC WITH DIFFERENTIAL/PLATELET
Basophils Absolute: 0 10*3/uL (ref 0.0–0.1)
Basophils Relative: 0.9 % (ref 0.0–3.0)
Eosinophils Absolute: 0.2 10*3/uL (ref 0.0–0.7)
Eosinophils Relative: 3.8 % (ref 0.0–5.0)
HCT: 38.9 % (ref 36.0–46.0)
Hemoglobin: 13 g/dL (ref 12.0–15.0)
Lymphocytes Relative: 50.6 % — ABNORMAL HIGH (ref 12.0–46.0)
Lymphs Abs: 2.1 10*3/uL (ref 0.7–4.0)
MCHC: 33.5 g/dL (ref 30.0–36.0)
MCV: 84.2 fl (ref 78.0–100.0)
Monocytes Absolute: 0.3 10*3/uL (ref 0.1–1.0)
Monocytes Relative: 8.2 % (ref 3.0–12.0)
Neutro Abs: 1.5 10*3/uL (ref 1.4–7.7)
Neutrophils Relative %: 36.5 % — ABNORMAL LOW (ref 43.0–77.0)
Platelets: 175 10*3/uL (ref 150.0–400.0)
RBC: 4.63 Mil/uL (ref 3.87–5.11)
RDW: 13.5 % (ref 11.5–15.5)
WBC: 4.1 10*3/uL (ref 4.0–10.5)

## 2020-11-30 LAB — LIPASE: Lipase: 23 U/L (ref 11.0–59.0)

## 2020-11-30 MED ORDER — SUPREP BOWEL PREP KIT 17.5-3.13-1.6 GM/177ML PO SOLN: 1.0000 | ORAL | 0 refills | Status: DC

## 2020-11-30 MED ORDER — ONDANSETRON 4 MG PO TBDP: ORAL_TABLET | ORAL | 0 refills | Status: DC

## 2020-11-30 MED ORDER — DICYCLOMINE HCL 10 MG PO CAPS: 10.0000 mg | ORAL_CAPSULE | Freq: Three times a day (TID) | ORAL | 0 refills | Status: DC | PRN

## 2020-11-30 NOTE — Progress Notes (Signed)
11/30/2020 KYNSIE FALKNER [id number] 12/25/1960   CHIEF COMPLAINT:  Nausea, difficulty swallowing   HISTORY OF PRESENT ILLNESS:  Keymani Glynn. Eversley is a 60 year old female with a past medical history of anxiety, morbid obesity, arthritis, breast cancer 2010 s/p left breast lumpectomy with chemo and radiation, pulmonary nodules, splenic vein thrombosis, OSA, chronic back, hip and knee pain and colon polyps. Past cholecystectomy, appendectomy, hysterectomy and C section. She presents to our office today for further evaluation regarding, nausea, vomiting, dysphagia and generalized abdominal pain. She describes having difficulty swallowing, foods such as meat gets stuck in the upper esophagus and she drinks water to pass the stuck food which occurs a few days weekly then goes away for a few weeks. She feels phlegm builds up in her throat, takes a cough drop to reduce the phlegm sensation. She describes regurgitating foods twice weekly, sometimes occurs for a few consecutive days. She has chronic generalized abdominal pain which has progressively worsened over the past 3 to 4 weeks. She is passing a normal formed brown stool daily. Infrequently sees blood with her bowel movements which she attributes to having hemorrhoids but has not occurred for at least 6 months. She underwent an EGD 06/2016. Her most recent colonoscopy was 08/08/2015 which showed mild diverticulosis to the left colon and a tubular adenomatous polyp was removed from the rectum. She was advised by Dr. Loletha Carrow to repeat a colonoscopy in 08/2020.   EGD 06/12/2016:  - The esophagus was normal. - The entire examined stomach was normal. Biopsies were taken from the antrum with a cold forceps for histology. - The cardia and gastric fundus were normal on retroflexion. - The examined duodenum was normal. GASTRIC EPITHELIUM, NO SIGNIFICANT HISTOPATHOLOGICAL ABNORMALITY IDENTIFIED. - WARTHIN STARRY STAINING FOR HELICOBACTER PYLORI IS  NEGATIVE.  Colonoscopy 08/08/2015: 1. There was mild diverticulosis noted in the left colon 2. Sessile tubular adenomatous polyp was found in the rectum; polypectomy was performed with cold forcep  Colonoscopy 06/12/2004: Sigmoid polyp POLYPOID COLONIC MUCOSA. NO ADENOMATOUS CHANGE   OR MALIGNANCY IDENTIFIED.   Past Medical History:  Diagnosis Date   Anemia    Anxiety    Arthritis    Breast cancer ([place]) 2010   T3N1 invasive ductal carcinoma left breast.Takes Arimidex daily   Bursitis    Carpal tunnel syndrome    Chronic back pain    stenosis   Constipation    takes Colace daily   Depression    takes Benzotropine daily   Diverticulitis of colon    Dyspnea    daily when walking for over 1 yr.   Fibromyalgia 08/2012   GERD (gastroesophageal reflux disease)    takes Dexilant daily   Hemorrhoid    History of blood transfusion    no abnormal reaction noted   History of colon polyps    benign   History of shingles    Joint pain    Joint swelling    Morbid obesity (HCC)    Night muscle spasms    takes Flexeril nightly as needed   Nocturia    OSA (obstructive sleep apnea)    OSA on CPAP    Peripheral edema    takes Furosemide.Just started 01/18/16   Peripheral neuropathy    takes Lyrica daily   Personal history of chemotherapy    2013   Personal history of radiation therapy    2013   Pneumonia    hx of-2015   Pseudoarthrosis of lumbar spine  Seasonal allergies    takes Singulair nightly   SOB (shortness of breath) on exertion    rarely with exertion   Splenorenal shunt malfunction (HCC)    stable splenorenal shunt 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 Alphonzo Grieve)   Type II diabetes mellitus ([place])    Past Surgical History:  Procedure Laterality Date   ABDOMINAL HYSTERECTOMY     still has ovaries   APPENDECTOMY     AXILLARY LYMPH NODE DISSECTION  11/28/2011   Procedure: AXILLARY LYMPH NODE DISSECTION;   Surgeon: Edward Jolly, MD;  Location: [place];  Service: General;  Laterality: Left;   BREAST LUMPECTOMY Left 11/28/2011   Malignant   BREAST SURGERY Left 2013   CARPAL TUNNEL RELEASE     Bilateral   CESAREAN SECTION     pt. has had 3   CHOLECYSTECTOMY     COLONOSCOPY     EYE SURGERY Bilateral    cataract removal   KNEE SURGERY     Left Knee   LAMINECTOMY WITH POSTERIOR LATERAL ARTHRODESIS LEVEL 1 N/A 11/29/2017   Procedure: Posterior Lateral Fusion - Lumbar Four-Lumbar Five, removal and replacement of hardware, Laminectomy - Lumbar Four-Lumbar Five;  Surgeon: Eustace Moore, MD;  Location: [place];  Service: Neurosurgery;  Laterality: N/A;   LAMINECTOMY WITH POSTERIOR LATERAL ARTHRODESIS LEVEL 2 N/A 06/07/2017   Procedure: Posterior Lateral Fusion Lumbar Three-Four and Transforaminal Interbody Fusion Lumbar Four-Five with Segmental  Pedicle Screw Fixation;  Surgeon: Eustace Moore, MD;  Location: [place];  Service: Neurosurgery;  Laterality: N/A;  Posterior Lateral Fusion Lumbar Three-Four and Transforaminal Interbody Fusion Lumbar Four-Five with Segmental  Pedicle Screw Fixation    LUMBAR FUSION  06/07/2017   POST   LUMBAR LAMINECTOMY/DECOMPRESSION MICRODISCECTOMY Bilateral 01/27/2016   Procedure: Laminectomy and Foraminotomy - Lumbar four -Lumbar five - bilateral- on-lay noninstrumented fusion;  Surgeon: Eustace Moore, MD;  Location: [place] [facility];  Service: Neurosurgery;  Laterality: Bilateral;   MULTIPLE EXTRACTIONS WITH ALVEOLOPLASTY N/A 05/11/2016   Procedure: EXTRACTION OF TEETH EIGHTEEN, TWENTY AND TWENTY- NINE;  REMOVAL OF MANDIBULAR TORUS AND EXOSTOSIS;  Surgeon: Diona Browner, DDS;  Location: [place];  Service: Oral Surgery;  Laterality: N/A;   PORTACATH PLACEMENT  06/18/2011   Procedure: INSERTION PORT-A-CATH;  Surgeon: Edward Jolly, MD;  Location: [place];  Service: General;  Laterality: Right;  right subclavian   removal portacath  2014   spur     Apex spur  on both big toes   TOE SURGERY Bilateral    TONSILLECTOMY     TOTAL KNEE ARTHROPLASTY Left 09/13/2014   TOTAL KNEE ARTHROPLASTY Left 09/13/2014   Procedure: LEFT TOTAL KNEE ARTHROPLASTY;  Surgeon: Rod Can, MD;  Location: [place];  Service: Orthopedics;  Laterality: Left;   TOTAL KNEE ARTHROPLASTY Right 03/10/2015   Procedure: RIGHT TOTAL KNEE ARTHROPLASTY;  Surgeon: Rod Can, MD;  Location: [facility];  Service: Orthopedics;  Laterality: Right;   Social History: She is single. Disabled. She hs 2 sons and 1 daughter. Nonsmoker. No alcohol or drug use.   Family History: Mother with history of DM and HTN. Father living age 50 with diabetes, HTN, stomach cancer and pancreatic cancer. Paternal aunt with breast cancer.    Allergies  Allergen Reactions   Aleve [Naproxen] Nausea Only   Compazine [Prochlorperazine] Other (See Comments)    Numbness of face and  lips    Hydrocodone Itching    High  dose only   Oxycodone Other (See Comments)    hallucinations    Penicillins Nausea Only and Other (See Comments)    Has patient had a PCN reaction causing immediate rash, facial/tongue/throat swelling, SOB or lightheadedness with hypotension: No Has patient had a PCN reaction causing severe rash involving mucus membranes or skin necrosis: No Has patient had a PCN reaction that required hospitalization No Has patient had a PCN reaction occurring within the last 10 years: No If all of the above answers are "NO", then may proceed with Cephalosporin use.      Outpatient Encounter Medications as of 11/30/2020  Medication Sig   ACCU-CHEK AVIVA PLUS test strip TEST WHILE FASTING AND 2 HOURS AFTER SUPPER   Accu-Chek Softclix Lancets lancets TEST AS DIRECTED AND 2 HOURS AFTER SUPPER   ALPRAZolam (XANAX) 0.5 MG tablet Take 0.5 mg by mouth daily as needed.   azithromycin (ZITHROMAX) 250 MG tablet Take 250 mg by mouth as directed.   B-D ULTRAFINE III SHORT PEN 31G X 8 MM MISC USE ONE NEEDLE D    benzonatate (TESSALON) 100 MG capsule Take 2 capsules (200 mg total) by mouth 3 (three) times daily as needed for cough.   cyclobenzaprine (FLEXERIL) 5 MG tablet TAKE 1 TABLET BY MOUTH TWICE A DAY AS NEEDED FOR MUSCLE SPASMS   DEXILANT 30 MG capsule Take 1 capsule by mouth daily.   diclofenac (VOLTAREN) 75 MG EC tablet Take 75 mg by mouth 2 (two) times daily.   DULoxetine (CYMBALTA) 60 MG capsule Take 60 mg by mouth daily.    furosemide (LASIX) 20 MG tablet Take 20 mg by mouth daily.   gabapentin (NEURONTIN) 300 MG capsule TAKE 1 CAPSULE BY MOUTH THREE TIMES A DAY   glipiZIDE (GLUCOTROL) 10 MG tablet Take 10 mg by mouth daily.   glipiZIDE (GLUCOTROL) 5 MG tablet 10 mg.    lisinopril (ZESTRIL) 5 MG tablet    metoprolol succinate (TOPROL-XL) 25 MG 24 hr tablet Take 12.5 mg by mouth daily.   montelukast (SINGULAIR) 10 MG tablet Take 10 mg by mouth at bedtime.   ondansetron (ZOFRAN) 4 MG tablet Take 4 mg by mouth every 8 (eight) hours as needed for nausea or vomiting.   rosuvastatin (CRESTOR) 10 MG tablet Take 10 mg by mouth at bedtime.   temazepam (RESTORIL) 15 MG capsule Take 1 capsule (15 mg total) by mouth at bedtime as needed for sleep.   traMADol (ULTRAM) 50 MG tablet Take 1 tablet (50 mg total) by mouth every 6 (six) hours as needed. Do Not Fill Before 87/86/7672   TRULICITY 0.94 BS/9.6GE SOPN SMARTSIG:0.5 Milliliter(s) SUB-Q Once a Week   umeclidinium-vilanterol (ANORO ELLIPTA) 62.5-25 MCG/INH AEPB Inhale 1 puff into the lungs daily.   Valbenazine Tosylate 40 MG CAPS Take 40 mg by mouth daily.   No facility-administered encounter medications on file as of 11/30/2020.    REVIEW OF SYSTEMS:  Gen: Denies fever, sweats or chills. No weight loss.  CV: Denies chest pain, palpitations or edema. Resp: Denies cough, shortness of breath of hemoptysis.  GI: See HPI.  GU : Denies urinary burning, blood in urine, increased urinary frequency or incontinence. MS: Denies joint pain, muscles aches or  weakness. Derm: Denies rash, itchiness, skin lesions or unhealing ulcers. Psych: Denies depression, anxiety or memory loss.  Heme: Denies bruising, bleeding. Neuro:  Denies headaches, dizziness or paresthesias. Endo:  Denies any problems with DM, thyroid or adrenal function.   PHYSICAL EXAM: BP 104/62  Pulse 70   Ht 5\' 2"  (1.575 m)   Wt 284 lb (128.8 kg)   BMI 51.94 kg/m  Wt Readings from Last 3 Encounters:  11/30/20 284 lb (128.8 kg)  08/11/20 288 lb (130.6 kg)  05/31/20 290 lb 12.8 oz (131.9 kg)    General: 60 year old female in no acute distress. Head: Normocephalic and atraumatic. Eyes:  Sclerae non-icteric, conjunctive pink. Ears: Normal auditory acuity. Mouth: Dentition intact. No ulcers or lesions.  Neck: Supple, no lymphadenopathy or thyromegaly.  Lungs: Clear bilaterally to auscultation without wheezes, crackles or rhonchi. Heart: Regular rate and rhythm. No murmur, rub or gallop appreciated.  Abdomen: Soft. Generalized tenderness throughout without rebound or guarding. No masses. No hepatosplenomegaly. Normoactive bowel sounds x 4 quadrants. Thick lower midline scar intact.  Rectal: Deferred.  Musculoskeletal: Symmetrical with no gross deformities. Skin: Warm and dry. No rash or lesions on visible extremities. Extremities: No edema. Neurological: Alert oriented x 4, no focal deficits.  Psychological:  Alert and cooperative. Normal mood and affect.  ASSESSMENT AND PLAN:  98. 60 year old female chronic nausea, intermittent vomiting with generalize abdominal pain which has progressively worsened over the past few months.  -CBC, CMP, Lipase level  -CTAP without contrast -EGD and colonoscopy at [facility] (BMI > 50%) benefits and risks discussed including risk with sedation, risk of bleeding, perforation and infection  -Dicyclomine 10mg  one po bid PRN abdominal pain -Zofran 4mg  ODT 1 tab dissolve on tongue Q 6 hrs PRN  2. Dysphagia. Food regurgitation.  -EGD at [facility] as  ordered above  -Continue Dexilant 60mg  QD  3. History of a 54mm tubular adenomatous rectal polyp per colonoscopy 08/2015 -Colonoscopy at [facility] benefits and risks discussed including risk with sedation, risk of bleeding, perforation and infection   4. Personal history of breast cancer   5. DM II -Gastric empty study if EGD unrevealing  -Consider referral to [facility]   Further recommendations to be determined after the above evaluation completed           CC:  Vonna Drafts, FNP

## 2020-11-30 NOTE — Patient Instructions (Addendum)
If you are age 62 or younger, your body mass index should be between 19-25. Your Body mass index is 51.94 kg/m. If this is out of the aformentioned range listed, please consider follow up with your Primary Care Provider.   The [facility] providers would like to encourage you to use [facility] to communicate with providers for non-urgent requests or questions.  Due to long hold times on the telephone, sending your provider a message by [facility] may be faster and more efficient way to get a response. Please allow 48 business hours for a response.  Please remember that this is for non-urgent requests/questions.  LABS:  Lab work has been ordered for you today. Our lab is located in the basement. Press "B" on the elevator. The lab is located at the first door on the left as you exit the elevator.  HEALTHCARE LAWS AND MY CHART RESULTS: Due to recent changes in healthcare laws, you may see the results of your imaging and laboratory studies on MyChart before your provider has had a chance to review them.   We understand that in some cases there may be results that are confusing or concerning to you. Not all laboratory results come back in the same time frame and the provider may be waiting for multiple results in order to interpret others.  Please give [place] 48 hours in order for your provider to thoroughly review all the results before contacting the office for clarification of your results.   IMAGING: You will be contacted by [facility] (Your caller ID will indicate phone # [phone]) in the next 2 days to schedule your Abdominal CT. If you have not heard from them within 2 business days, please call [facility] at [phone] to follow up on the status of your appointment.    MEDICATION: We have sent the following medication to your pharmacy for you to pick up at your convenience: Dicyclomine 10 MG take 1 twice a day as needed for abdominal pain. Ondansetron  (ZOFRAN ODT) 4 MG disintegrating tablet- Place one tablet under the tongue every 6 hours as needed for nausea or vomiting.  Please call our office if your symptoms worsen. It was great seeing you today! Thank you for entrusting me with your care and choosing [facility].  Noralyn Pick, CRNP

## 2020-12-01 DIAGNOSIS — Z6841 Body Mass Index (BMI) 40.0 and over, adult: Secondary | ICD-10-CM | POA: Diagnosis not present

## 2020-12-01 DIAGNOSIS — E1140 Type 2 diabetes mellitus with diabetic neuropathy, unspecified: Secondary | ICD-10-CM | POA: Diagnosis not present

## 2020-12-06 NOTE — Progress Notes (Signed)
____________________________________________________________  Attending physician addendum:  Thank you for sending this case to me. I have reviewed the entire note and agree with the plan.   Wreatha Sturgeon Danis, MD  ____________________________________________________________  

## 2020-12-07 DIAGNOSIS — R251 Tremor, unspecified: Secondary | ICD-10-CM | POA: Diagnosis not present

## 2020-12-12 ENCOUNTER — Ambulatory Visit (HOSPITAL_COMMUNITY)
Admission: RE | Admit: 2020-12-12 | Discharge: 2020-12-12 | Disposition: A | Discharge status: Home / Self Care | Payer: Medicare HMO | Source: Ambulatory Visit | Attending: Nurse Practitioner | Admitting: Nurse Practitioner

## 2020-12-12 ENCOUNTER — Other Ambulatory Visit: Payer: Self-pay

## 2020-12-12 DIAGNOSIS — R112 Nausea with vomiting, unspecified: Secondary | ICD-10-CM | POA: Diagnosis not present

## 2020-12-12 DIAGNOSIS — R1030 Lower abdominal pain, unspecified: Secondary | ICD-10-CM | POA: Diagnosis not present

## 2020-12-12 DIAGNOSIS — R1010 Upper abdominal pain, unspecified: Secondary | ICD-10-CM | POA: Insufficient documentation

## 2020-12-12 DIAGNOSIS — R1111 Vomiting without nausea: Secondary | ICD-10-CM | POA: Diagnosis not present

## 2020-12-12 DIAGNOSIS — Z86010 Personal history of colonic polyps: Secondary | ICD-10-CM | POA: Insufficient documentation

## 2020-12-12 DIAGNOSIS — M160 Bilateral primary osteoarthritis of hip: Secondary | ICD-10-CM | POA: Diagnosis not present

## 2020-12-12 DIAGNOSIS — R1310 Dysphagia, unspecified: Secondary | ICD-10-CM | POA: Diagnosis not present

## 2020-12-12 DIAGNOSIS — Z9889 Other specified postprocedural states: Secondary | ICD-10-CM | POA: Diagnosis not present

## 2020-12-12 DIAGNOSIS — K769 Liver disease, unspecified: Secondary | ICD-10-CM | POA: Diagnosis not present

## 2020-12-12 IMAGING — CT CT ABD-PELV W/O CM
2 of 4 series · 17 of 46 positions shown, 19 images · non-contrast
Comparison: CT chest 11/16/2020 and 01/21/2014.

CLINICAL DATA: Nausea, vomiting and abdominal pain.

EXAM:
CT ABDOMEN AND PELVIS WITHOUT CONTRAST
TECHNIQUE: Multidetector CT imaging of the abdomen and pelvis was performed
following the standard protocol without IV contrast.

[Series 2: axial st · axial · 0.81mm/px · z∈[-463,-78]mm · 14 of 89 slices shown, 16 images]
[im 6/89  soft-tissue]
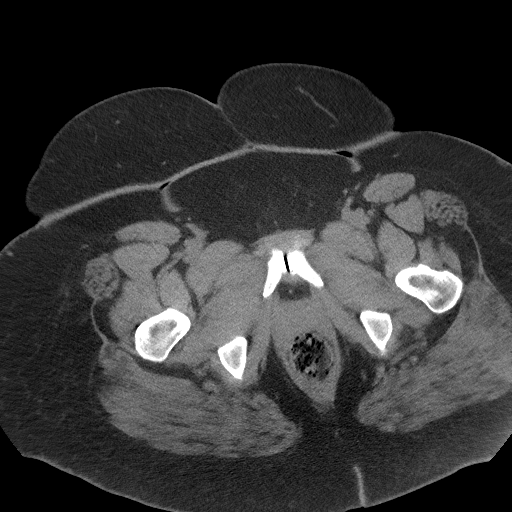
[im 6/89  bone]
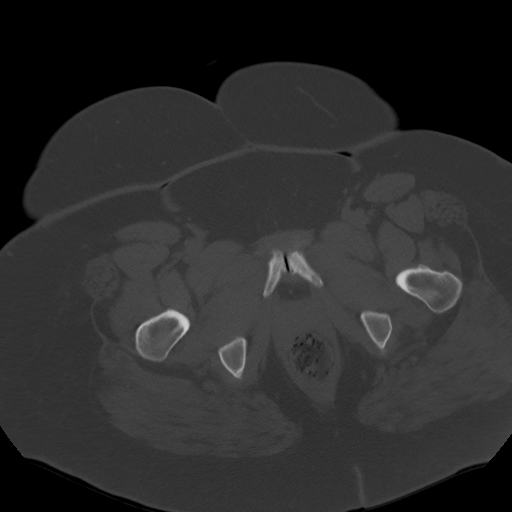
[im 12/89  soft-tissue]
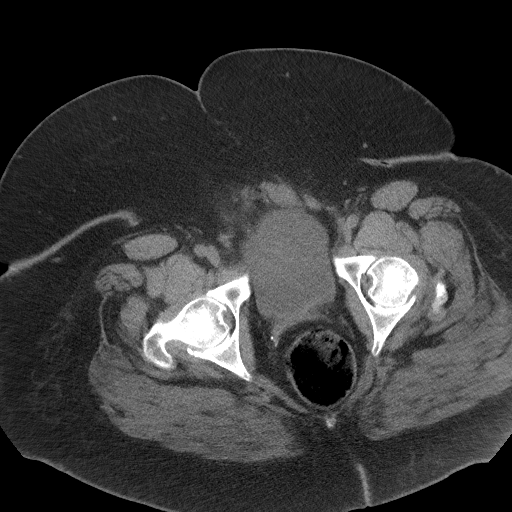
[im 17/89  soft-tissue]
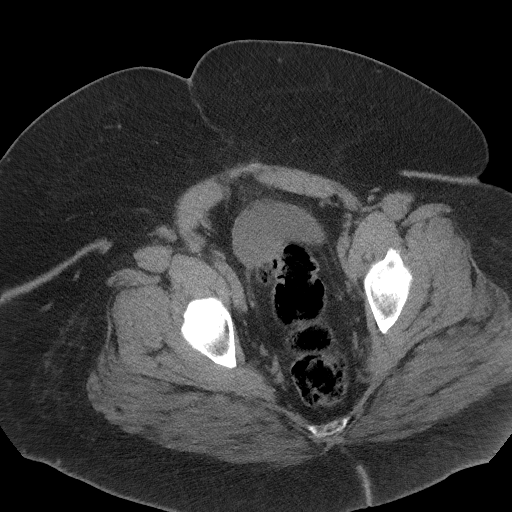
[im 23/89  soft-tissue]
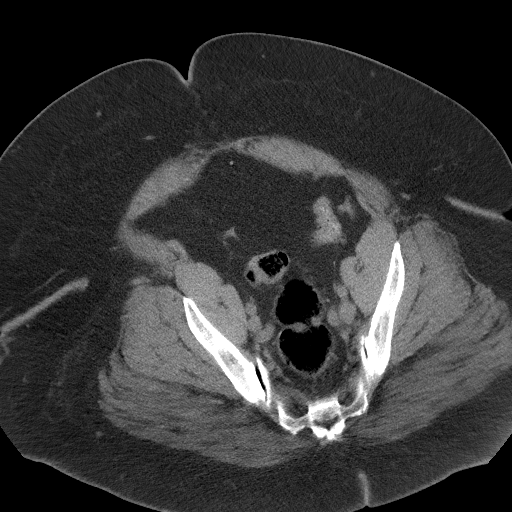
[im 28/89  soft-tissue]
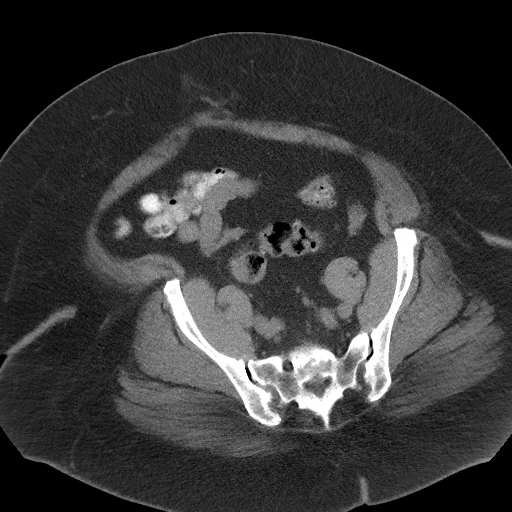
[im 34/89  soft-tissue]
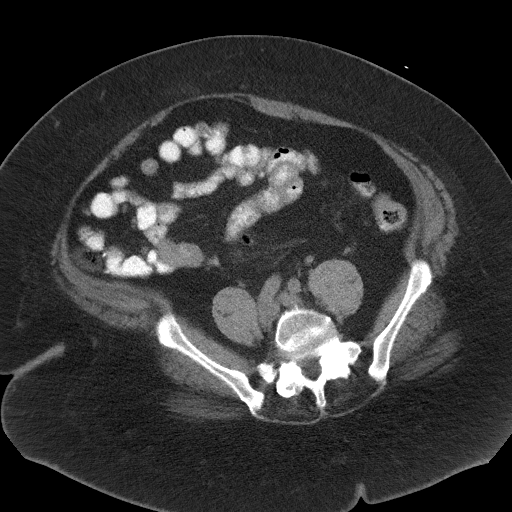
[im 39/89  soft-tissue]
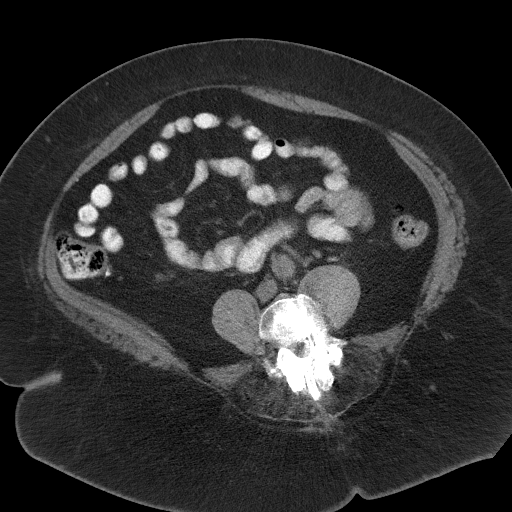
[im 50/89  soft-tissue]
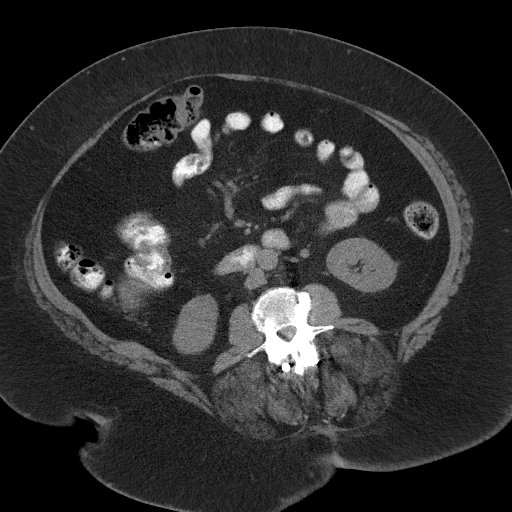
[im 56/89  soft-tissue]
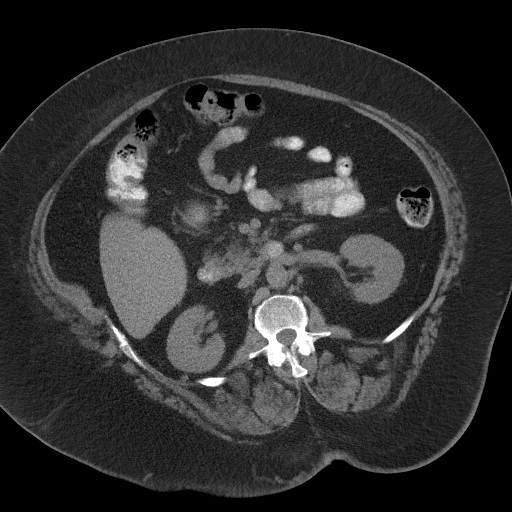
[im 56/89  bone]
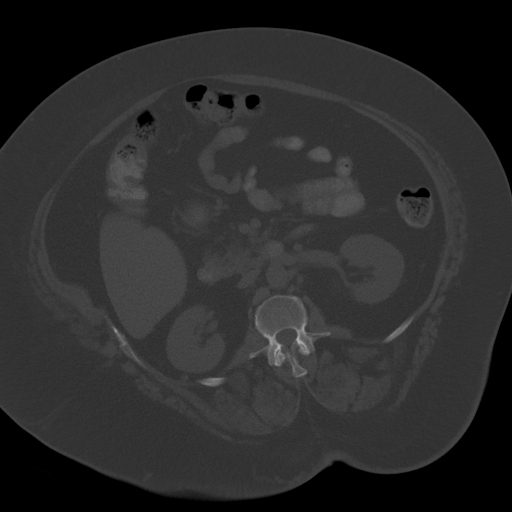
[im 61/89  soft-tissue]
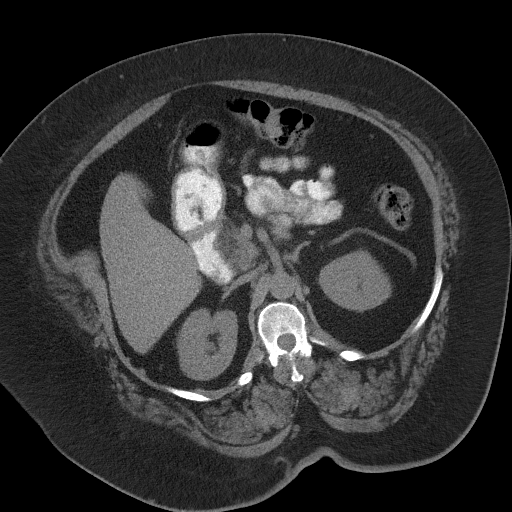
[im 67/89  soft-tissue]
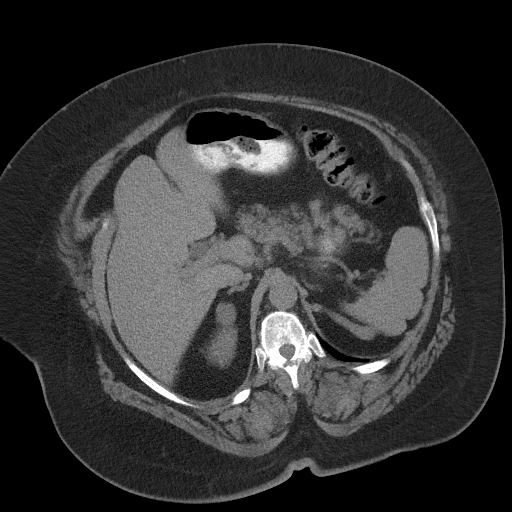
[im 72/89  soft-tissue]
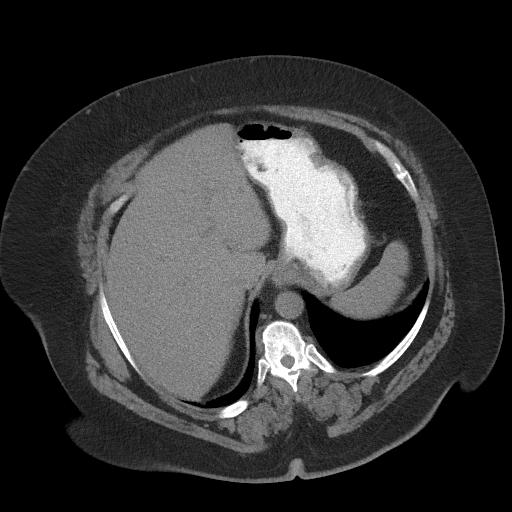
[im 78/89  soft-tissue]
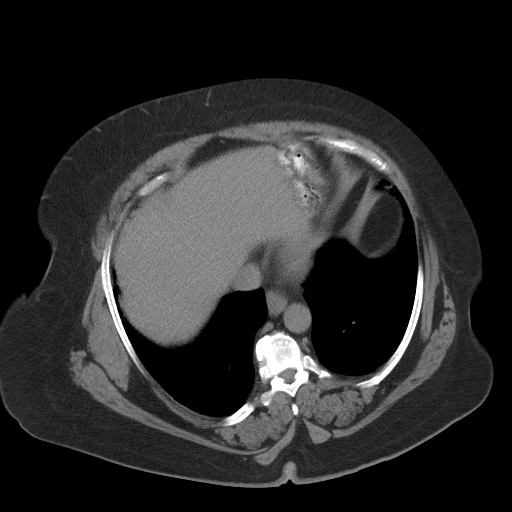
[im 83/89  soft-tissue]
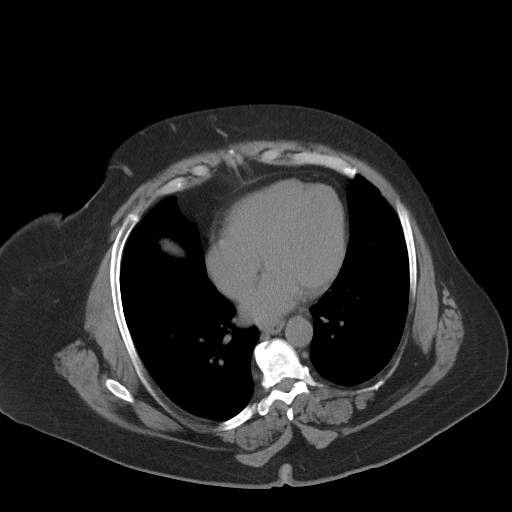

[Series 5: coronal st · coronal · 0.84mm/px · 3 of 124 slices shown]
[im 42/124  soft-tissue]
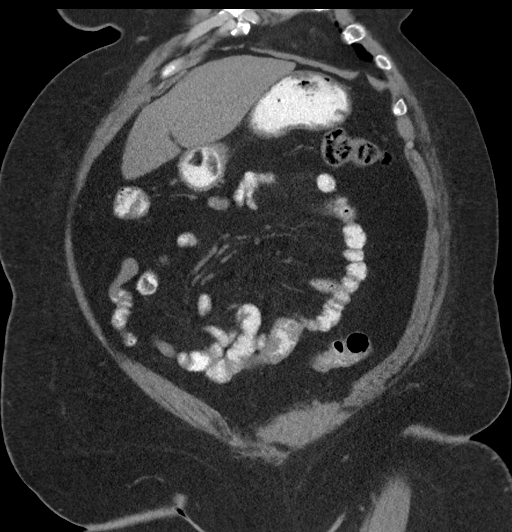
[im 55/124  soft-tissue]
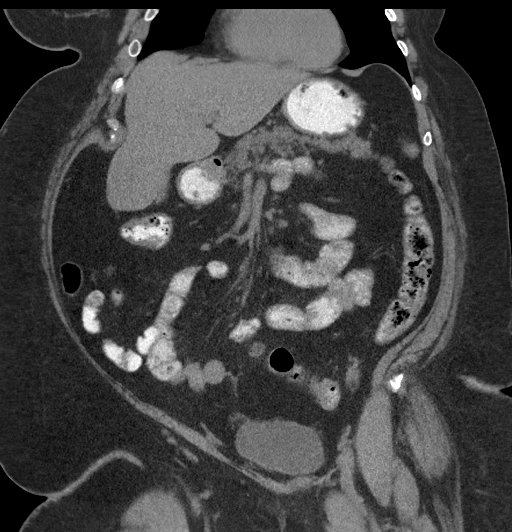
[im 69/124  soft-tissue]
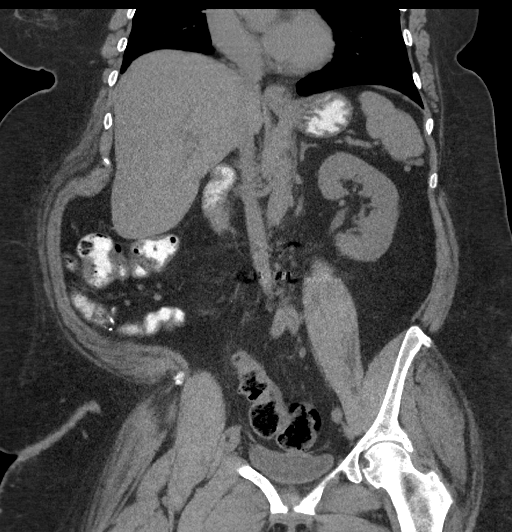

[17 of 46 positions shown; findings below may reference images not displayed]

FINDINGS: Lower chest: 2 mm nodule in the anterior right lower lobe ([DATE]),
unchanged and benign. Scarring in the lingula. Peripheral left lower
lobe nodule measures 3 mm ([DATE]), as on 11/16/2020. Heart is
enlarged. No pericardial or pleural effusion. Distal esophagus is
grossly unremarkable.

Hepatobiliary: Subcentimeter low-attenuation lesion in the left
hepatic lobe is unchanged and benign. Liver is otherwise
unremarkable. Cholecystectomy. No biliary ductal dilatation.

Pancreas: Negative.

Spleen: Negative.

Adrenals/Urinary Tract: Adrenal glands are unremarkable.
Low-attenuation lesion off the upper pole right kidney measures
cm, similar and likely a cyst. Subcentimeter low-attenuation lesion
off the lower pole left kidney is too small to characterize. No
urinary stones. Kidneys are otherwise unremarkable. Ureters are
decompressed. Bladder is grossly unremarkable.

Stomach/Bowel: Stomach, small bowel and colon are unremarkable.
Appendectomy.

Vascular/Lymphatic: Vascular structures are unremarkable. No
pathologically enlarged lymph nodes.

Reproductive: Hysterectomy.  No adnexal mass.

Other: No free fluid.  Mesenteries and peritoneum are unremarkable.

Musculoskeletal: Degenerative and postoperative changes in the
spine. No worrisome lytic or sclerotic lesions. Degenerative changes
in the hips.
IMPRESSION: 1. No findings to explain the patient's clinical history.
2. 3 mm peripheral left lower lobe nodule, better evaluated on CT
chest 11/16/2020.

## 2020-12-19 DIAGNOSIS — G4733 Obstructive sleep apnea (adult) (pediatric): Secondary | ICD-10-CM | POA: Diagnosis not present

## 2020-12-26 DIAGNOSIS — G629 Polyneuropathy, unspecified: Secondary | ICD-10-CM | POA: Diagnosis not present

## 2020-12-26 DIAGNOSIS — R251 Tremor, unspecified: Secondary | ICD-10-CM | POA: Diagnosis not present

## 2020-12-29 ENCOUNTER — Encounter: Payer: Self-pay | Admitting: Sports Medicine

## 2020-12-29 ENCOUNTER — Other Ambulatory Visit: Payer: Self-pay

## 2020-12-29 ENCOUNTER — Ambulatory Visit (INDEPENDENT_AMBULATORY_CARE_PROVIDER_SITE_OTHER): Payer: Medicare HMO | Admitting: Sports Medicine

## 2020-12-29 DIAGNOSIS — M79675 Pain in left toe(s): Secondary | ICD-10-CM

## 2020-12-29 DIAGNOSIS — G8929 Other chronic pain: Secondary | ICD-10-CM | POA: Diagnosis not present

## 2020-12-29 DIAGNOSIS — M79674 Pain in right toe(s): Secondary | ICD-10-CM

## 2020-12-29 DIAGNOSIS — M7750 Other enthesopathy of unspecified foot: Secondary | ICD-10-CM | POA: Diagnosis not present

## 2020-12-29 DIAGNOSIS — E1149 Type 2 diabetes mellitus with other diabetic neurological complication: Secondary | ICD-10-CM

## 2020-12-29 DIAGNOSIS — M25572 Pain in left ankle and joints of left foot: Secondary | ICD-10-CM | POA: Diagnosis not present

## 2020-12-29 DIAGNOSIS — M25571 Pain in right ankle and joints of right foot: Secondary | ICD-10-CM | POA: Diagnosis not present

## 2020-12-29 DIAGNOSIS — B351 Tinea unguium: Secondary | ICD-10-CM

## 2020-12-29 MED ORDER — TRIAMCINOLONE ACETONIDE 10 MG/ML IJ SUSP: 10.0000 mg | Freq: Once | INTRAMUSCULAR | Status: AC

## 2020-12-29 NOTE — Progress Notes (Signed)
Subjective: Kelsey Wilson is a 60 y.o. female patient with history of diabetes who presents to office today complaining of long,mildly painful nails  while ambulating in shoes; unable to trim. Patient states that the glucose reading this morning was not recorded but last visit to PCP was 3 months ago.  Patient also admits that she is having more worsening pain in her ankles her ankles have bothered her for years.  Patient Active Problem List   Diagnosis Date Noted   Morbid obesity ([place])    Type II diabetes mellitus ([place])    GERD (gastroesophageal reflux disease) 06/21/2019   Insomnia 03/03/2019   Ankle arthritis 12/12/2018   Muscle weakness 12/12/2018   Obstructive sleep apnea 10/23/2018   Asthmatic bronchitis 10/23/2018   Cellulitis of internal cheek, left 01/23/2018   Lung nodule 09/15/2017   Morbid obesity with BMI of 45.0-49.9, adult ([place]) 09/05/2017   Arthritis of carpometacarpal (CMC) joints of both thumbs 03/14/2017   Spinal stenosis, lumbar region, with neurogenic claudication 03/07/2017   Vertigo 10/12/2016   Peripheral positional vertigo 08/28/2016   Abnormal auditory perception of both ears 07/27/2016   Neuropathic pain 06/25/2016   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   S/P lumbar spinal fusion 01/27/2016   H/O therapeutic radiation 09/21/2015   Personal history of breast cancer 09/21/2015   Pes planus of both feet 08/04/2015   Primary osteoarthritis of right knee 03/10/2015   Osteoarthritis of right knee 02/04/2015   Primary osteoarthritis of left knee 09/13/2014   Hot flashes 01/19/2014   Abdominal pain, unspecified site 01/19/2014   Malignant neoplasm of upper-outer quadrant of left breast in female, estrogen receptor positive ([place]) 03/19/2013   Lumbosacral spondylosis without myelopathy 12/30/2012   Fibromyalgia syndrome 08/15/2012   Peripheral neuropathy, toxic 08/15/2012   Lymphedema of arm - left 04/30/2012   Current Outpatient Medications on File Prior to  Visit  Medication Sig Dispense Refill   ACCU-CHEK AVIVA PLUS test strip TEST WHILE FASTING AND 2 HOURS AFTER SUPPER     Accu-Chek Softclix Lancets lancets TEST AS DIRECTED AND 2 HOURS AFTER SUPPER     ALPRAZolam (XANAX) 0.5 MG tablet Take 0.5 mg by mouth daily as needed.     azithromycin (ZITHROMAX) 250 MG tablet Take 250 mg by mouth as directed.     B-D ULTRAFINE III SHORT PEN 31G X 8 MM MISC USE ONE NEEDLE D     benzonatate (TESSALON) 100 MG capsule Take 2 capsules (200 mg total) by mouth 3 (three) times daily as needed for cough. 40 capsule 12   cyclobenzaprine (FLEXERIL) 5 MG tablet TAKE 1 TABLET BY MOUTH TWICE A DAY AS NEEDED FOR MUSCLE SPASMS 60 tablet 3   DEXILANT 30 MG capsule Take 1 capsule by mouth daily.     diclofenac (VOLTAREN) 75 MG EC tablet Take 75 mg by mouth 2 (two) times daily.     dicyclomine (BENTYL) 10 MG capsule Take 1 capsule (10 mg total) by mouth 3 (three) times daily as needed for spasms (abdominal pain). 30 capsule 0   DULoxetine (CYMBALTA) 60 MG capsule Take 60 mg by mouth daily.      furosemide (LASIX) 20 MG tablet Take 20 mg by mouth daily.     gabapentin (NEURONTIN) 300 MG capsule TAKE 1 CAPSULE BY MOUTH THREE TIMES A DAY 90 capsule 5   glipiZIDE (GLUCOTROL) 10 MG tablet Take 10 mg by mouth daily.     glipiZIDE (GLUCOTROL) 5 MG tablet 10 mg.  lisinopril (ZESTRIL) 5 MG tablet      metoprolol succinate (TOPROL-XL) 25 MG 24 hr tablet Take 12.5 mg by mouth daily.     montelukast (SINGULAIR) 10 MG tablet Take 10 mg by mouth at bedtime.     ondansetron (ZOFRAN) 4 MG tablet Take 4 mg by mouth every 8 (eight) hours as needed for nausea or vomiting.     ondansetron (ZOFRAN-ODT) 4 MG disintegrating tablet Dissolve one tablet on the tongue every 8 hours as needed for nausea and vomiting. 30 tablet 0   rosuvastatin (CRESTOR) 10 MG tablet Take 10 mg by mouth at bedtime.     SUPREP BOWEL PREP KIT 17.5-3.13-1.6 GM/177ML SOLN Take 1 kit by mouth as directed. For colonoscopy  prep 354 mL 0   temazepam (RESTORIL) 15 MG capsule Take 1 capsule (15 mg total) by mouth at bedtime as needed for sleep. 30 capsule 5   traMADol (ULTRAM) 50 MG tablet Take 1 tablet (50 mg total) by mouth every 6 (six) hours as needed. Do Not Fill Before 08/24/2019 630 tablet 5   TRULICITY 1.60 FU/9.3AT SOPN SMARTSIG:0.5 Milliliter(s) SUB-Q Once a Week     umeclidinium-vilanterol (ANORO ELLIPTA) 62.5-25 MCG/INH AEPB Inhale 1 puff into the lungs daily. 1 each 12   Valbenazine Tosylate 40 MG CAPS Take 40 mg by mouth daily.     No current facility-administered medications on file prior to visit.   Allergies  Allergen Reactions   Aleve [Naproxen] Nausea Only   Compazine [Prochlorperazine] Other (See Comments)    Numbness of face and  lips    Hydrocodone Itching    High dose only   Oxycodone Other (See Comments)    hallucinations    Penicillins Nausea Only and Other (See Comments)    Has patient had a PCN reaction causing immediate rash, facial/tongue/throat swelling, SOB or lightheadedness with hypotension: No Has patient had a PCN reaction causing severe rash involving mucus membranes or skin necrosis: No Has patient had a PCN reaction that required hospitalization No Has patient had a PCN reaction occurring within the last 10 years: No If all of the above answers are "NO", then may proceed with Cephalosporin use.    Recent Results (from the past 2160 hour(s))  Lipase     Status: None   Collection Time: 11/30/20 10:51 AM  Result Value Ref Range   Lipase 23.0 11.0 - 59.0 U/L  Comprehensive metabolic panel     Status: None   Collection Time: 11/30/20 10:51 AM  Result Value Ref Range   Sodium 140 135 - 145 mEq/L   Potassium 3.7 3.5 - 5.1 mEq/L   Chloride 102 96 - 112 mEq/L   CO2 31 19 - 32 mEq/L   Glucose, Bld 72 70 - 99 mg/dL   BUN 13 6 - 23 mg/dL   Creatinine, Ser 0.95 0.40 - 1.20 mg/dL   Total Bilirubin 0.8 0.2 - 1.2 mg/dL   Alkaline Phosphatase 71 39 - 117 U/L   AST 18 0 -  37 U/L   ALT 15 0 - 35 U/L   Total Protein 7.1 6.0 - 8.3 g/dL   Albumin 3.9 3.5 - 5.2 g/dL   GFR 64.51 >60.00 mL/min    Comment: Calculated using the CKD-EPI Creatinine Equation (2021)   Calcium 9.3 8.4 - 10.5 mg/dL  CBC with Differential/Platelet     Status: Abnormal   Collection Time: 11/30/20 10:51 AM  Result Value Ref Range   WBC 4.1 4.0 - 10.5 K/uL  RBC 4.63 3.87 - 5.11 Mil/uL   Hemoglobin 13.0 12.0 - 15.0 g/dL   HCT 38.9 36.0 - 46.0 %   MCV 84.2 78.0 - 100.0 fl   MCHC 33.5 30.0 - 36.0 g/dL   RDW 13.5 11.5 - 15.5 %   Platelets 175.0 150.0 - 400.0 K/uL   Neutrophils Relative % 36.5 (L) 43.0 - 77.0 %   Lymphocytes Relative 50.6 (H) 12.0 - 46.0 %   Monocytes Relative 8.2 3.0 - 12.0 %   Eosinophils Relative 3.8 0.0 - 5.0 %   Basophils Relative 0.9 0.0 - 3.0 %   Neutro Abs 1.5 1.4 - 7.7 K/uL   Lymphs Abs 2.1 0.7 - 4.0 K/uL   Monocytes Absolute 0.3 0.1 - 1.0 K/uL   Eosinophils Absolute 0.2 0.0 - 0.7 K/uL   Basophils Absolute 0.0 0.0 - 0.1 K/uL    Objective: General: Patient is awake, alert, and oriented x 3 and in no acute distress.  Integument: Skin is warm, dry and supple bilateral. Nails are tender, long, thickened and  dystrophic with subungual debris, consistent with onychomycosis, 1-5 bilateral. No signs of infection. Old scar on left foot. No open lesions or preulcerative lesions present bilateral. Remaining integument unremarkable.  Vasculature:  Dorsalis Pedis pulse 1/4 bilateral. Posterior Tibial pulse  1/4 bilateral.  Capillary fill time <3 sec 1-5 bilateral. Positive hair growth to the level of the digits. Temperature gradient within normal limits. No varicosities present bilateral. No edema present bilateral.   Neurology: Subjective numbness to toes.   Musculoskeletal: Pain with palpation to the dorsal lateral foot and ankle bilateral.  There is mild guarding with range of motion to the ankles bilateral.  Asymptomatic pes planus pedal deformities noted  bilateral. Muscular strength 4/5 in all lower extremity muscular groups bilateral without pain on range of motion. No tenderness with calf compression bilateral.  Assessment and Plan: Problem List Items Addressed This Visit   None Visit Diagnoses     Pain due to onychomycosis of toenails of both feet    -  Primary   Type II diabetes mellitus with neurological manifestations (HCC)       Capsulitis of ankle, unspecified laterality       Chronic ankle pain, bilateral          -Examined patient. -Re-Discussed and educated patient on diabetic foot care, especially with  regards to the vascular, neurological and musculoskeletal systems.  -Mechanically debrided all nails 1-5 bilateral using sterile nail nipper and filed with dremel without incident  -After oral consent and aseptic prep, injected a mixture containing 1 ml of 2%  plain lidocaine, 1 ml 0.5% plain marcaine, 0.5 ml of kenalog 10 and 0.5 ml of dexamethasone phosphate into left and right ankle without complication. Post-injection care discussed with patient.  -Advised patient if ankle pain continues will need x-rays at next visit -Answered all patient questions -Patient to return  in 2.5-3 months for at risk foot care and x-rays at ankles if still painful -Patient advised to call the office if any problems or questions arise in the meantime.  Landis Martins, DPM

## 2020-12-30 NOTE — Progress Notes (Signed)
HPI F never smoker followed for OSA,, Asthma, allergic rhinitis,  lung nodules, complicated by DM2, hx Breast cancer L in remission, HBP HST 01/30/2019- AHI 23.7/ hr, desaturation to 71%, body weight 293 lbs NPSG 07/23/2016- AHI 116.6/ hr, desaturation to 72%   ------------------------------------------------------------------------------------    05/31/20- 59 yoF never smoker followed for OSA,, Asthma, lung nodules, complicated by DM2, hx Breast cancer L in remission, HBP, GERD Anoro, Singulair,  CPAP 5-15/ [employer]   Not using Download- compliance 10%, AHI 5.4/ hr Body weight today-290 lbs Covid vax- 2 Phizer Flu vax- had On lisinopril- note dry cough Denies daytime sleepiness.  CPAP bothered with dry throat/ mucus. Discussed CT - stable nodules. Agreed to repeat in 6 months w hx breast cancer. HRCT chest 05/16/20- IMPRESSION: 1. A previously queried nodule of the dependent right lower lobe is resolved, consistent with resolution of atelectasis and/or infection/inflammation. 2. Multiple stable small pulmonary nodules, the majority stable for greater than 2 years and almost certainly definitively benign sequelae of infection or inflammation. 3. Stable 5 mm nodule of the dependent left lower lobe, which as noted on prior examination was slightly increased in size in the interval from 02/12/2021 02/19/2020, but remains most likely infectious or inflammatory in nature. 4. Additional CT follow-up for the small pulmonary nodules if indicated by clinical oncologic protocol given history of breast cancer.  01/02/21- 59 yoF never smoker followed for  Asthma, lung nodules, Hx OSA (failed CPAP) complicated by DM2, hx Breast cancer L in remission, HBP, GERD -Anoro, Singulair, Body weight today-     281 lbs                                   Needs f/u CT for nodules Covid vax- 2 Phizer -----Pt not currently using cpap machine. Quit using CPAP in March. Just stopped bothering- was falling asleep  before she put it on. She is vague about noting any difference with daytime tiredness, snoring, etc. Discussed mdical indications, comparing OSA to HTN. She states she will go back to wearing CPAP. Other than potential weight loss, other interventions not likely to help her.  Discussed CT- stable nodules. She will follow w Oncology as before. CT chest 11/17/19- IMPRESSION: 1. Occasional small pulmonary nodules are stable and almost certainly benign sequelae of prior infection or inflammation. Additional follow-up only if indicated by clinical oncology imaging protocol. 2. Postoperative findings of left lumpectomy and axillary lymph node dissection.  ROS-see HPI   + = positive Constitutional:    weight loss, night sweats, fevers, chills, fatigue, lassitude. HEENT:    headaches, +difficulty swallowing, +tooth/dental problems, sore throat,       +sneezing,+ itching,+ ear ache, nasal congestion, post nasal drip, snoring CV:    chest pain, orthopnea, PND, +swelling in lower extremities, anasarca,                                   dizziness, palpitations Resp:   +shortness of breath with exertion or at rest.                productive cough,   non-productive cough, coughing up of blood.              change in color of mucus.  wheezing.   Skin:    rash or lesions. GI:  No-   heartburn, indigestion, +abdominal  pain, nausea, vomiting, diarrhea,                 change in bowel habits, loss of appetite GU: dysuria, change in color of urine, no urgency or frequency.   flank pain. MS:   +joint pain, stiffness, decreased range of motion, back pain. Neuro-     nothing unusual Psych:  change in mood or affect.  depression or +anxiety.   memory loss.  OBJ- Physical Exam General- Alert, Oriented, Affect-appropriate, Distress- none acute, + morbidly obese Skin- rash-none, lesions- none, excoriation- none Lymphadenopathy- none Head- atraumatic            Eyes- Gross vision intact, PERRLA, conjunctivae  and secretions clear            Ears- Hearing, canals-normal            Nose- Clear, no-Septal dev, mucus, polyps, erosion, perforation             Throat- Mallampati III , mucosa clear , drainage- none, tonsils- atrophic, + teeth Neck- flexible , trachea midline, no stridor , thyroid nl, carotid no bruit Chest - symmetrical excursion , unlabored           Heart/CV- RRR , no murmur , no gallop  , no rub, nl s1 s2                           - JVD- none , edema- none, stasis changes- none, varices- none           Lung- clear to P&A, wheeze- none, cough- none , dullness-none, rub- none           Chest wall- +Scar from port access Abd-  Br/ Gen/ Rectal- Not done, not indicated Extrem- cyanosis- none, clubbing, none, atrophy- none, strength- nl, + cane Neuro-

## 2021-01-02 ENCOUNTER — Other Ambulatory Visit: Payer: Self-pay

## 2021-01-02 ENCOUNTER — Encounter: Payer: Self-pay | Admitting: Internal Medicine

## 2021-01-02 ENCOUNTER — Ambulatory Visit (INDEPENDENT_AMBULATORY_CARE_PROVIDER_SITE_OTHER): Payer: Medicare HMO | Admitting: Internal Medicine

## 2021-01-02 DIAGNOSIS — G4733 Obstructive sleep apnea (adult) (pediatric): Secondary | ICD-10-CM

## 2021-01-02 DIAGNOSIS — Z6841 Body Mass Index (BMI) 40.0 and over, adult: Secondary | ICD-10-CM | POA: Diagnosis not present

## 2021-01-02 NOTE — Patient Instructions (Signed)
It is healthier for your body to keep using CPAP when you sleep, so I hope you will get back to using it every night.  Please call if we can help

## 2021-01-02 NOTE — Assessment & Plan Note (Signed)
She has lost a few lbs. Strongly encouraged to keep working at this.

## 2021-01-02 NOTE — Assessment & Plan Note (Signed)
I reviewed medical issues of OSA, treatment goals. Plan- she indicates intention to resume CPAP use.

## 2021-01-11 DIAGNOSIS — I10 Essential (primary) hypertension: Secondary | ICD-10-CM | POA: Diagnosis not present

## 2021-01-11 DIAGNOSIS — E782 Mixed hyperlipidemia: Secondary | ICD-10-CM | POA: Diagnosis not present

## 2021-01-11 DIAGNOSIS — G4700 Insomnia, unspecified: Secondary | ICD-10-CM | POA: Diagnosis not present

## 2021-01-11 DIAGNOSIS — Z23 Encounter for immunization: Secondary | ICD-10-CM | POA: Diagnosis not present

## 2021-01-17 ENCOUNTER — Encounter: Payer: Self-pay | Admitting: Gastroenterology

## 2021-01-18 ENCOUNTER — Telehealth: Payer: Self-pay

## 2021-01-18 NOTE — Telephone Encounter (Signed)
Spoke with patient to confirm her EGD/colon at [facility] on Tuesday, 01/24/21 at 9:45 am. Patient is aware that she will need to arrive at 8:15 am with a care partner. She confirmed that she does have her instructions. Patient verbalized understanding and had no concerns at the end of the call.

## 2021-01-19 ENCOUNTER — Encounter (HOSPITAL_COMMUNITY): Payer: Self-pay | Admitting: Gastroenterology

## 2021-01-19 ENCOUNTER — Other Ambulatory Visit: Payer: Self-pay

## 2021-01-24 ENCOUNTER — Encounter (HOSPITAL_COMMUNITY): Payer: Self-pay | Admitting: Gastroenterology

## 2021-01-24 ENCOUNTER — Other Ambulatory Visit: Payer: Self-pay

## 2021-01-24 ENCOUNTER — Ambulatory Visit (HOSPITAL_COMMUNITY): Payer: Medicare HMO | Admitting: Certified Registered"

## 2021-01-24 ENCOUNTER — Ambulatory Visit (HOSPITAL_COMMUNITY)
Admission: RE | Admit: 2021-01-24 | Discharge: 2021-01-24 | Disposition: A | Discharge status: Home / Self Care | Payer: Medicare HMO | Attending: Gastroenterology | Admitting: Gastroenterology

## 2021-01-24 ENCOUNTER — Encounter (HOSPITAL_COMMUNITY)
Admission: RE | Disposition: A | Discharge status: Home / Self Care | Payer: Self-pay | Source: Home / Self Care | Attending: Gastroenterology

## 2021-01-24 DIAGNOSIS — R112 Nausea with vomiting, unspecified: Secondary | ICD-10-CM | POA: Diagnosis not present

## 2021-01-24 DIAGNOSIS — Z6841 Body Mass Index (BMI) 40.0 and over, adult: Secondary | ICD-10-CM | POA: Insufficient documentation

## 2021-01-24 DIAGNOSIS — K5730 Diverticulosis of large intestine without perforation or abscess without bleeding: Secondary | ICD-10-CM | POA: Insufficient documentation

## 2021-01-24 DIAGNOSIS — Z9221 Personal history of antineoplastic chemotherapy: Secondary | ICD-10-CM | POA: Insufficient documentation

## 2021-01-24 DIAGNOSIS — Z9989 Dependence on other enabling machines and devices: Secondary | ICD-10-CM | POA: Diagnosis not present

## 2021-01-24 DIAGNOSIS — Z79811 Long term (current) use of aromatase inhibitors: Secondary | ICD-10-CM | POA: Insufficient documentation

## 2021-01-24 DIAGNOSIS — E119 Type 2 diabetes mellitus without complications: Secondary | ICD-10-CM | POA: Insufficient documentation

## 2021-01-24 DIAGNOSIS — D128 Benign neoplasm of rectum: Secondary | ICD-10-CM | POA: Diagnosis not present

## 2021-01-24 DIAGNOSIS — Z86010 Personal history of colonic polyps: Secondary | ICD-10-CM

## 2021-01-24 DIAGNOSIS — K449 Diaphragmatic hernia without obstruction or gangrene: Secondary | ICD-10-CM | POA: Diagnosis not present

## 2021-01-24 DIAGNOSIS — R1310 Dysphagia, unspecified: Secondary | ICD-10-CM | POA: Insufficient documentation

## 2021-01-24 DIAGNOSIS — Z880 Allergy status to penicillin: Secondary | ICD-10-CM | POA: Diagnosis not present

## 2021-01-24 DIAGNOSIS — Z885 Allergy status to narcotic agent status: Secondary | ICD-10-CM | POA: Diagnosis not present

## 2021-01-24 DIAGNOSIS — K625 Hemorrhage of anus and rectum: Secondary | ICD-10-CM | POA: Diagnosis not present

## 2021-01-24 DIAGNOSIS — Z8719 Personal history of other diseases of the digestive system: Secondary | ICD-10-CM | POA: Insufficient documentation

## 2021-01-24 DIAGNOSIS — Z886 Allergy status to analgesic agent status: Secondary | ICD-10-CM | POA: Insufficient documentation

## 2021-01-24 DIAGNOSIS — Z853 Personal history of malignant neoplasm of breast: Secondary | ICD-10-CM | POA: Diagnosis not present

## 2021-01-24 DIAGNOSIS — Z888 Allergy status to other drugs, medicaments and biological substances status: Secondary | ICD-10-CM | POA: Diagnosis not present

## 2021-01-24 DIAGNOSIS — K635 Polyp of colon: Secondary | ICD-10-CM | POA: Diagnosis not present

## 2021-01-24 DIAGNOSIS — Z923 Personal history of irradiation: Secondary | ICD-10-CM | POA: Diagnosis not present

## 2021-01-24 DIAGNOSIS — G4733 Obstructive sleep apnea (adult) (pediatric): Secondary | ICD-10-CM | POA: Diagnosis not present

## 2021-01-24 DIAGNOSIS — R1084 Generalized abdominal pain: Secondary | ICD-10-CM | POA: Diagnosis not present

## 2021-01-24 HISTORY — PX: ESOPHAGOGASTRODUODENOSCOPY (EGD) WITH PROPOFOL: SHX5813

## 2021-01-24 HISTORY — PX: COLONOSCOPY WITH PROPOFOL: SHX5780

## 2021-01-24 HISTORY — PX: POLYPECTOMY: SHX5525

## 2021-01-24 LAB — GLUCOSE, CAPILLARY: Glucose-Capillary: 78 mg/dL (ref 70–99)

## 2021-01-24 SURGERY — ESOPHAGOGASTRODUODENOSCOPY (EGD) WITH PROPOFOL
Anesthesia: Monitor Anesthesia Care

## 2021-01-24 MED ORDER — PROPOFOL 10 MG/ML IV BOLUS
INTRAVENOUS | Status: AC
  Filled 2021-01-24: qty 20

## 2021-01-24 MED ORDER — PROPOFOL 10 MG/ML IV BOLUS
INTRAVENOUS | Status: DC | PRN
  Administered 2021-01-24 (×2): 20 mg via INTRAVENOUS

## 2021-01-24 MED ORDER — LACTATED RINGERS IV SOLN: INTRAVENOUS | Status: DC | PRN

## 2021-01-24 MED ORDER — PROPOFOL 500 MG/50ML IV EMUL
INTRAVENOUS | Status: DC | PRN
  Administered 2021-01-24: 125 ug/kg/min via INTRAVENOUS

## 2021-01-24 MED ORDER — SODIUM CHLORIDE 0.9 % IV SOLN
INTRAVENOUS | Status: DC
Start: 2021-01-24 — End: 2021-01-24

## 2021-01-24 MED ORDER — PROPOFOL 1000 MG/100ML IV EMUL
INTRAVENOUS | Status: AC
  Filled 2021-01-24: qty 100

## 2021-01-24 SURGICAL SUPPLY — 24 items

## 2021-01-24 NOTE — Anesthesia Procedure Notes (Signed)
Procedure Name: MAC Date/Time: 01/24/2021 10:05 AM Performed by: Niel Hummer, CRNA Pre-anesthesia Checklist: Patient identified, Emergency Drugs available, Suction available and Patient being monitored Oxygen Delivery Method: Simple face mask

## 2021-01-24 NOTE — Anesthesia Postprocedure Evaluation (Signed)
Anesthesia Post Note  Patient: Kelsey Wilson  Procedure(s) Performed: ESOPHAGOGASTRODUODENOSCOPY (EGD) WITH PROPOFOL COLONOSCOPY WITH PROPOFOL POLYPECTOMY     Patient location during evaluation: Endoscopy Anesthesia Type: MAC Level of consciousness: awake and alert Pain management: pain level controlled Vital Signs Assessment: post-procedure vital signs reviewed and stable Respiratory status: spontaneous breathing, nonlabored ventilation and respiratory function stable Cardiovascular status: blood pressure returned to baseline and stable Postop Assessment: no apparent nausea or vomiting Anesthetic complications: no   No notable events documented.  Last Vitals:  Vitals:   01/24/21 1055 01/24/21 1105  BP: (!) 96/55 (!) 100/49  Pulse: 88 80  Resp: 17 (!) 21  Temp:    SpO2: 97% 98%    Last Pain:  Vitals:   01/24/21 1105  TempSrc:   PainSc: 0-No pain                 Lidia Collum

## 2021-01-24 NOTE — H&P (Signed)
History and Physical:  This patient presents for endoscopic testing for: Abdominal pain, dysphagia, nausea and vomiting (See 11/30/20 clinic note for details - no clinical change since then) No source of symptoms on recent CTAP.   Past Medical History: Past Medical History:  Diagnosis Date   Anemia    Anxiety    Arthritis    Breast cancer ([place]) 2010   T3N1 invasive ductal carcinoma left breast.Takes Arimidex daily   Bursitis    Carpal tunnel syndrome    Chronic back pain    stenosis   Constipation    takes Colace daily   Depression    takes Benzotropine daily   Diverticulitis of colon    Dyspnea    daily when walking for over 1 yr.   Fibromyalgia 08/2012   GERD (gastroesophageal reflux disease)    takes Dexilant daily   Hemorrhoid    History of blood transfusion    no abnormal reaction noted   History of colon polyps    benign   History of shingles    Joint pain    Joint swelling    Morbid obesity (HCC)    Night muscle spasms    takes Flexeril nightly as needed   Nocturia    OSA (obstructive sleep apnea)    OSA on CPAP    Peripheral edema    takes Furosemide.Just started 01/18/16   Peripheral neuropathy    takes Lyrica daily   Personal history of chemotherapy    2013   Personal history of radiation therapy    2013   Pneumonia    hx of-2015   Pseudoarthrosis of lumbar spine    Seasonal allergies    takes Singulair nightly   SOB (shortness of breath) on exertion    rarely with exertion   Splenorenal shunt malfunction (HCC)    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 Alphonzo Grieve)   Type II diabetes mellitus ([place])      Past Surgical History: Past Surgical History:  Procedure Laterality Date   ABDOMINAL HYSTERECTOMY     still has ovaries   APPENDECTOMY     AXILLARY LYMPH NODE DISSECTION  11/28/2011   Procedure: AXILLARY LYMPH NODE DISSECTION;  Surgeon: Edward Jolly, MD;   Location: [place];  Service: General;  Laterality: Left;   BREAST LUMPECTOMY Left 11/28/2011   Malignant   BREAST SURGERY Left 2013   CARPAL TUNNEL RELEASE     Bilateral   CESAREAN SECTION     pt. has had 3   CHOLECYSTECTOMY     COLONOSCOPY     EYE SURGERY Bilateral    cataract removal   KNEE SURGERY     Left Knee   LAMINECTOMY WITH POSTERIOR LATERAL ARTHRODESIS LEVEL 1 N/A 11/29/2017   Procedure: Posterior Lateral Fusion - Lumbar Four-Lumbar Five, removal and replacement of hardware, Laminectomy - Lumbar Four-Lumbar Five;  Surgeon: Eustace Moore, MD;  Location: [place];  Service: Neurosurgery;  Laterality: N/A;   LAMINECTOMY WITH POSTERIOR LATERAL ARTHRODESIS LEVEL 2 N/A 06/07/2017   Procedure: Posterior Lateral Fusion Lumbar Three-Four and Transforaminal Interbody Fusion Lumbar Four-Five with Segmental  Pedicle Screw Fixation;  Surgeon: Eustace Moore, MD;  Location: [place];  Service: Neurosurgery;  Laterality: N/A;  Posterior Lateral Fusion Lumbar Three-Four and Transforaminal Interbody Fusion Lumbar Four-Five with Segmental  Pedicle Screw Fixation    LUMBAR FUSION  06/07/2017   POST   LUMBAR LAMINECTOMY/DECOMPRESSION MICRODISCECTOMY Bilateral 01/27/2016  Procedure: Laminectomy and Foraminotomy - Lumbar four -Lumbar five - bilateral- on-lay noninstrumented fusion;  Surgeon: Eustace Moore, MD;  Location: [place] [facility];  Service: Neurosurgery;  Laterality: Bilateral;   MULTIPLE EXTRACTIONS WITH ALVEOLOPLASTY N/A 05/11/2016   Procedure: EXTRACTION OF TEETH EIGHTEEN, TWENTY AND TWENTY- NINE;  REMOVAL OF MANDIBULAR TORUS AND EXOSTOSIS;  Surgeon: Diona Browner, DDS;  Location: [place];  Service: Oral Surgery;  Laterality: N/A;   PORTACATH PLACEMENT  06/18/2011   Procedure: INSERTION PORT-A-CATH;  Surgeon: Edward Jolly, MD;  Location: [place];  Service: General;  Laterality: Right;  right subclavian   removal portacath  2014   spur     Apex spur on both big toes   TOE SURGERY  Bilateral    TONSILLECTOMY     TOTAL KNEE ARTHROPLASTY Left 09/13/2014   TOTAL KNEE ARTHROPLASTY Left 09/13/2014   Procedure: LEFT TOTAL KNEE ARTHROPLASTY;  Surgeon: Rod Can, MD;  Location: [facility];  Service: Orthopedics;  Laterality: Left;   TOTAL KNEE ARTHROPLASTY Right 03/10/2015   Procedure: RIGHT TOTAL KNEE ARTHROPLASTY;  Surgeon: Rod Can, MD;  Location: [facility];  Service: Orthopedics;  Laterality: Right;    Allergies: Allergies  Allergen Reactions   Aleve [Naproxen] Nausea Only   Compazine [Prochlorperazine] Other (See Comments)    Numbness of face and  lips    Hydrocodone Itching    High dose only   Oxycodone Other (See Comments)    hallucinations    Penicillins Nausea Only and Other (See Comments)    Has patient had a PCN reaction causing immediate rash, facial/tongue/throat swelling, SOB or lightheadedness with hypotension: No Has patient had a PCN reaction causing severe rash involving mucus membranes or skin necrosis: No Has patient had a PCN reaction that required hospitalization No Has patient had a PCN reaction occurring within the last 10 years: No If all of the above answers are "NO", then may proceed with Cephalosporin use.    Outpatient Meds: Current Facility-Administered Medications  Medication Dose Route Frequency Provider Last Rate Last Admin   0.9 %  sodium chloride infusion   Intravenous Continuous Noralyn Pick, NP          ___________________________________________________________________ Objective   Exam:  BP 127/87   Temp 98.2 F (36.8 C) (Oral)   Resp (!) 21   Ht '5\' 2"'$  (1.575 m)   Wt 127.9 kg   SpO2 97%   BMI 51.58 kg/m   CV: RRR without murmur, S1/S2 Resp: clear to auscultation bilaterally, normal RR and effort noted GI: soft, mild generalized tenderness to light palpation abdominal wall, with active bowel sounds. Exam limited to evaluate for deep mass due to body habitus   Assessment: Generalized abdominal  pain (for months, constant) Nausea and vomiting (2-3 times/wk) Last Hgb A1c currently not known  Plan: Colonoscopy EGD   The patient is appropriate for an endoscopic procedure in the ambulatory setting.   - Wilfrid Lund, MD

## 2021-01-24 NOTE — Transfer of Care (Signed)
Immediate Anesthesia Transfer of Care Note  Patient: Kelsey Wilson  Procedure(s) Performed: ESOPHAGOGASTRODUODENOSCOPY (EGD) WITH PROPOFOL COLONOSCOPY WITH PROPOFOL POLYPECTOMY  Patient Location: PACU  Anesthesia Type:MAC  Level of Consciousness: awake, alert  and oriented  Airway & Oxygen Therapy: Patient Spontanous Breathing and Patient connected to face mask oxygen  Post-op Assessment: Report given to RN, Post -op Vital signs reviewed and stable and Patient moving all extremities X 4  Post vital signs: Reviewed and stable  Last Vitals:  Vitals Value Taken Time  BP 99/50   Temp    Pulse 96   Resp 14   SpO2 95     Last Pain:  Vitals:   01/24/21 0842  TempSrc: Oral  PainSc: 0-No pain         Complications: No notable events documented.

## 2021-01-24 NOTE — Op Note (Signed)
[facility] Patient Name: Kelsey Wilson Procedure Date: 01/24/2021 MRN: [id number] Attending MD: Estill Cotta. Loletha Carrow , MD Date of Birth: 19-Oct-1960 CSN: [id number] Age: 60 Admit Type: Outpatient Procedure:                Upper GI endoscopy Indications:              Generalized abdominal pain, Nausea with vomiting Providers:                Mallie Mussel L. Loletha Carrow, MD, Baird Cancer, RN, Tyrone Apple, Technician Referring MD:              Medicines:                Monitored Anesthesia Care Complications:            No immediate complications. Estimated Blood Loss:     Estimated blood loss: none. Procedure:                Pre-Anesthesia Assessment:                           - Prior to the procedure, a History and Physical                            was performed, and patient medications and                            allergies were reviewed. The patient's tolerance of                            previous anesthesia was also reviewed. The risks                            and benefits of the procedure and the sedation                            options and risks were discussed with the patient.                            All questions were answered, and informed consent                            was obtained. Prior Anticoagulants: The patient has                            taken no previous anticoagulant or antiplatelet                            agents. ASA Grade Assessment: III - A patient with                            severe systemic disease. After reviewing the risks  and benefits, the patient was deemed in                            satisfactory condition to undergo the procedure.                           After obtaining informed consent, the endoscope was                            passed under direct vision. Throughout the                            procedure, the patient's blood pressure, pulse, and                             oxygen saturations were monitored continuously. The                            [id number]-H190 ([id number] Olympus endoscope was introduced                            through the mouth, and advanced to the second part                            of duodenum. The upper GI endoscopy was                            accomplished without difficulty. The patient                            tolerated the procedure fairly well. Scope In: Scope Out: Findings:      A 2 cm hiatal hernia was present.      The exam of the esophagus was otherwise normal.      The stomach was normal.      The cardia and gastric fundus were normal on retroflexion.      The examined duodenum was normal. Impression:               - 2 cm hiatal hernia.                           - Normal stomach.                           - Normal examined duodenum.                           - No specimens collected.                           No visible cause for symptoms seen on this study. Moderate Sedation:      MAC sedation used Recommendation:           - Patient has a contact number available for  emergencies. The signs and symptoms of potential                            delayed complications were discussed with the                            patient. Return to normal activities tomorrow.                            Written discharge instructions were provided to the                            patient.                           - Resume previous diet.                           - Continue present medications.                           - See the other procedure note for documentation of                            additional recommendations.                           - Optimal glucose control.                           - Do a gastric emptying study at appointment to be                            scheduled. Procedure Code(s):        --- Professional ---                           [phone], Esophagogastroduodenoscopy,  flexible,                            transoral; diagnostic, including collection of                            specimen(s) by brushing or washing, when performed                            (separate procedure) Diagnosis Code(s):        --- Professional ---                           K44.9, Diaphragmatic hernia without obstruction or                            gangrene                           R10.84, Generalized abdominal pain  R11.2, Nausea with vomiting, unspecified CPT copyright 2019 [employer]. All rights reserved. The codes documented in this report are preliminary and upon coder review may  be revised to meet current compliance requirements. Krystin Keeven L. Loletha Carrow, MD 01/24/2021 10:46:38 AM This report has been signed electronically. Number of Addenda: 0

## 2021-01-24 NOTE — Interval H&P Note (Signed)
History and Physical Interval Note:  01/24/2021 10:04 AM  Kelsey Wilson  has presented today for surgery, with the diagnosis of Dysphagia/colon polyps.  The various methods of treatment have been discussed with the patient and family. After consideration of risks, benefits and other options for treatment, the patient has consented to  Procedure(s): ESOPHAGOGASTRODUODENOSCOPY (EGD) WITH PROPOFOL (N/A) COLONOSCOPY WITH PROPOFOL (N/A) as a surgical intervention.  The patient's history has been reviewed, patient examined, no change in status, stable for surgery.  I have reviewed the patient's chart and labs.  Questions were answered to the patient's satisfaction.     Kelsey Wilson

## 2021-01-24 NOTE — Anesthesia Preprocedure Evaluation (Signed)
Anesthesia Evaluation  Patient identified by MRN, date of birth, ID band Patient awake    Reviewed: Allergy & Precautions, NPO status , Patient's Chart, lab work & pertinent test results, reviewed documented beta blocker date and time   History of Anesthesia Complications Negative for: history of anesthetic complications  Airway Mallampati: II  TM Distance: >3 FB Neck ROM: Full    Dental   Pulmonary shortness of breath, asthma , sleep apnea and Continuous Positive Airway Pressure Ventilation ,    Pulmonary exam normal        Cardiovascular hypertension, Pt. on medications and Pt. on home beta blockers Normal cardiovascular exam     Neuro/Psych Anxiety Depression negative neurological ROS     GI/Hepatic Neg liver ROS, GERD  ,  Endo/Other  diabetes, Type 2, Oral Hypoglycemic AgentsMorbid obesity  Renal/GU negative Renal ROS  negative genitourinary   Musculoskeletal  (+) Arthritis , Fibromyalgia -  Abdominal   Peds  Hematology negative hematology ROS (+) H/o breast cancer   Anesthesia Other Findings  Echo 2013: - Left ventricle: The cavity size was normal. Systolic function was normal. The estimated ejection fraction was in the range of 55% to 60%. Wall motion was normal; there were no regional wall motion abnormalities.  - Pulmonary arteries: PA peak pressure: 34m Hg (S).   Stress test 2017:  - Nuclear stress EF: 64%. - There was no ST segment deviation noted during stress. - Defect 1: There is a small defect of moderate severity present in the apex location. - The study is normal. - The left ventricular ejection fraction is normal (55-65%).  Normal stress nuclear study with mild apical thinning; no ischemia or infarction; EF 64 with normal wall motion.   Reproductive/Obstetrics                             Anesthesia Physical Anesthesia Plan  ASA: 3  Anesthesia Plan: MAC   Post-op  Pain Management:    Induction: Intravenous  PONV Risk Score and Plan: 2 and Propofol infusion, TIVA and Treatment may vary due to age or medical condition  Airway Management Planned: Natural Airway, Nasal Cannula and Simple Face Mask  Additional Equipment: None  Intra-op Plan:   Post-operative Plan:   Informed Consent: I have reviewed the patients History and Physical, chart, labs and discussed the procedure including the risks, benefits and alternatives for the proposed anesthesia with the patient or authorized representative who has indicated his/her understanding and acceptance.       Plan Discussed with:   Anesthesia Plan Comments:         Anesthesia Quick Evaluation

## 2021-01-24 NOTE — Op Note (Addendum)
[facility] Patient Name: Kelsey Wilson Procedure Date: 01/24/2021 MRN: [id number] Attending MD: Estill Cotta. Loletha Carrow , MD Date of Birth: 1960-08-13 CSN: [id number] Age: 60 Admit Type: Outpatient Procedure:                Colonoscopy Indications:              Generalized abdominal pain, Rectal bleeding                           no cause for symptoms on recent CTAP Providers:                Mallie Mussel L. Loletha Carrow, MD, Baird Cancer, RN, Tyrone Apple, Technician Referring MD:              Medicines:                Monitored Anesthesia Care Complications:            No immediate complications. Estimated Blood Loss:     Estimated blood loss was minimal. Procedure:                After obtaining informed consent, the colonoscope                            was passed under direct vision. Throughout the                            procedure, the patient's blood pressure, pulse, and                            oxygen saturations were monitored continuously. The                            CF-HQ190L ([id number]) Olympus colonoscope was                            introduced through the anus and advanced to the the                            cecum, identified by appendiceal orifice and                            ileocecal valve. The colonoscopy was performed                            without difficulty. The patient tolerated the                            procedure well. The quality of the bowel                            preparation was excellent. The ileocecal valve,                            appendiceal orifice,  and rectum were photographed. Scope In: 10:13:16 AM Scope Out: 10:27:18 AM Scope Withdrawal Time: 0 hours 9 minutes 41 seconds  Total Procedure Duration: 0 hours 14 minutes 2 seconds  Findings:      The perianal and digital rectal examinations were normal.      Multiple diverticula were found in the left colon.      A diminutive polyp was found in the  rectum. The polyp was sessile. The       polyp was removed with a cold snare. Resection and retrieval were       complete.      The exam was otherwise without abnormality on direct and retroflexion       views. Impression:               - Diverticulosis in the left colon.                           - One diminutive polyp in the rectum, removed with                            a cold snare. Resected and retrieved.                           - The examination was otherwise normal on direct                            and retroflexion views.                           Benign anal bleeding. No cause seen for abdominal                            pain on this exam. History and exam findings                            suggest it is abdominal wall pain. Moderate Sedation:      MAC sedation used Recommendation:           - Patient has a contact number available for                            emergencies. The signs and symptoms of potential                            delayed complications were discussed with the                            patient. Return to normal activities tomorrow.                            Written discharge instructions were provided to the                            patient.                           - Resume previous diet.                           -  Continue present medications. However, if                            dicyclomine has not been helpful to rellieve                            digestive symptoms, stop taking it.                           - Await pathology results.                           - Repeat colonoscopy is recommended for                            surveillance. The colonoscopy date will be                            determined after pathology results from today's                            exam become available for review.                           - See the other procedure note for documentation of                            additional  recommendations. Procedure Code(s):        --- Professional ---                           [phone], Colonoscopy, flexible; with removal of                            tumor(s), polyp(s), or other lesion(s) by snare                            technique Diagnosis Code(s):        --- Professional ---                           K62.1, Rectal polyp                           R10.84, Generalized abdominal pain                           K62.5, Hemorrhage of anus and rectum                           K57.30, Diverticulosis of large intestine without                            perforation or abscess without bleeding CPT copyright 2019 American Medical Association. All rights reserved. The codes documented in this report are preliminary and upon coder review may  be revised to meet current compliance requirements. Mallie Mussel  Laurian Brim, MD 01/24/2021 10:33:17 AM This report has been signed electronically. Number of Addenda: 0

## 2021-01-24 NOTE — Discharge Instructions (Addendum)
YOU HAD AN ENDOSCOPIC PROCEDURE TODAY: Refer to the procedure report and other information in the discharge instructions given to you for any specific questions about what was found during the examination. If this information does not answer your questions, please call [place] office at [phone] to clarify.   YOU SHOULD EXPECT: Some feelings of bloating in the abdomen. Passage of more gas than usual. Walking can help get rid of the air that was put into your GI tract during the procedure and reduce the bloating. If you had a lower endoscopy (such as a colonoscopy or flexible sigmoidoscopy) you may notice spotting of blood in your stool or on the toilet paper. Some abdominal soreness may be present for a day or two, also.  DIET: Your first meal following the procedure should be a light meal and then it is ok to progress to your normal diet. A half-sandwich or bowl of soup is an example of a good first meal. Heavy or fried foods are harder to digest and may make you feel nauseous or bloated. Drink plenty of fluids but you should avoid alcoholic beverages for 24 hours.    ACTIVITY: Your care partner should take you home directly after the procedure. You should plan to take it easy, moving slowly for the rest of the day. You can resume normal activity the day after the procedure however YOU SHOULD NOT DRIVE, use power tools, machinery or perform tasks that involve climbing or major physical exertion for 24 hours (because of the sedation medicines used during the test).   SYMPTOMS TO REPORT IMMEDIATELY: A gastroenterologist can be reached at any hour. Please call [phone]  for any of the following symptoms:  Following lower endoscopy (colonoscopy, flexible sigmoidoscopy) Excessive amounts of blood in the stool  Significant tenderness, worsening of abdominal pains  Swelling of the abdomen that is new, acute  Fever of 100 or higher  Following upper endoscopy (EGD, EUS, ERCP, esophageal  dilation) Vomiting of blood or coffee ground material  New, significant abdominal pain  New, significant chest pain or pain under the shoulder blades  Painful or persistently difficult swallowing  New shortness of breath  Black, tarry-looking or red, bloody stools  FOLLOW UP:  If any biopsies were taken you will be contacted by phone or by letter within the next 1-3 weeks. Call [phone]  if you have not heard about the biopsies in 3 weeks.  Please also call with any specific questions about appointments or follow up tests.     Stop taking dicyclomine if you have not found it helpful to relieve any digestive symptoms.  ____________________________________________________________________

## 2021-01-25 ENCOUNTER — Encounter (HOSPITAL_COMMUNITY): Payer: Self-pay | Admitting: Gastroenterology

## 2021-01-25 LAB — SURGICAL PATHOLOGY

## 2021-01-26 ENCOUNTER — Encounter: Payer: Self-pay | Admitting: Gastroenterology

## 2021-01-26 DIAGNOSIS — Z961 Presence of intraocular lens: Secondary | ICD-10-CM | POA: Diagnosis not present

## 2021-01-26 DIAGNOSIS — E119 Type 2 diabetes mellitus without complications: Secondary | ICD-10-CM | POA: Diagnosis not present

## 2021-01-26 DIAGNOSIS — H04123 Dry eye syndrome of bilateral lacrimal glands: Secondary | ICD-10-CM | POA: Diagnosis not present

## 2021-01-26 DIAGNOSIS — H26493 Other secondary cataract, bilateral: Secondary | ICD-10-CM | POA: Diagnosis not present

## 2021-01-26 DIAGNOSIS — G4730 Sleep apnea, unspecified: Secondary | ICD-10-CM | POA: Diagnosis not present

## 2021-01-26 DIAGNOSIS — H10413 Chronic giant papillary conjunctivitis, bilateral: Secondary | ICD-10-CM | POA: Diagnosis not present

## 2021-01-27 DIAGNOSIS — N951 Menopausal and female climacteric states: Secondary | ICD-10-CM | POA: Diagnosis not present

## 2021-01-27 DIAGNOSIS — Z01419 Encounter for gynecological examination (general) (routine) without abnormal findings: Secondary | ICD-10-CM | POA: Diagnosis not present

## 2021-01-27 DIAGNOSIS — Z853 Personal history of malignant neoplasm of breast: Secondary | ICD-10-CM | POA: Diagnosis not present

## 2021-01-27 DIAGNOSIS — Z01411 Encounter for gynecological examination (general) (routine) with abnormal findings: Secondary | ICD-10-CM | POA: Diagnosis not present

## 2021-01-27 DIAGNOSIS — N952 Postmenopausal atrophic vaginitis: Secondary | ICD-10-CM | POA: Diagnosis not present

## 2021-01-27 DIAGNOSIS — Z124 Encounter for screening for malignant neoplasm of cervix: Secondary | ICD-10-CM | POA: Diagnosis not present

## 2021-01-27 DIAGNOSIS — Z6841 Body Mass Index (BMI) 40.0 and over, adult: Secondary | ICD-10-CM | POA: Diagnosis not present

## 2021-01-31 DIAGNOSIS — H26492 Other secondary cataract, left eye: Secondary | ICD-10-CM | POA: Diagnosis not present

## 2021-02-01 LAB — HM PAP SMEAR

## 2021-02-16 ENCOUNTER — Other Ambulatory Visit: Payer: Self-pay

## 2021-02-16 ENCOUNTER — Encounter: Payer: Medicare HMO | Attending: Registered Nurse | Admitting: Registered Nurse

## 2021-02-16 VITALS — BP 106/75 | HR 84 | Temp 97.9°F | Ht 62.0 in | Wt 283.0 lb

## 2021-02-16 DIAGNOSIS — M797 Fibromyalgia: Secondary | ICD-10-CM | POA: Diagnosis not present

## 2021-02-16 DIAGNOSIS — Z5181 Encounter for therapeutic drug level monitoring: Secondary | ICD-10-CM | POA: Diagnosis not present

## 2021-02-16 DIAGNOSIS — Z79891 Long term (current) use of opiate analgesic: Secondary | ICD-10-CM | POA: Diagnosis not present

## 2021-02-16 DIAGNOSIS — Z981 Arthrodesis status: Secondary | ICD-10-CM | POA: Diagnosis not present

## 2021-02-16 DIAGNOSIS — M961 Postlaminectomy syndrome, not elsewhere classified: Secondary | ICD-10-CM | POA: Diagnosis not present

## 2021-02-16 DIAGNOSIS — G894 Chronic pain syndrome: Secondary | ICD-10-CM | POA: Diagnosis not present

## 2021-02-16 DIAGNOSIS — M47817 Spondylosis without myelopathy or radiculopathy, lumbosacral region: Secondary | ICD-10-CM

## 2021-02-16 DIAGNOSIS — M7062 Trochanteric bursitis, left hip: Secondary | ICD-10-CM

## 2021-02-16 DIAGNOSIS — M1711 Unilateral primary osteoarthritis, right knee: Secondary | ICD-10-CM | POA: Diagnosis not present

## 2021-02-16 DIAGNOSIS — M7061 Trochanteric bursitis, right hip: Secondary | ICD-10-CM | POA: Diagnosis not present

## 2021-02-16 MED ORDER — GABAPENTIN 300 MG PO CAPS: ORAL_CAPSULE | ORAL | 5 refills | Status: DC

## 2021-02-16 MED ORDER — TRAMADOL HCL 50 MG PO TABS: 50.0000 mg | ORAL_TABLET | Freq: Four times a day (QID) | ORAL | 5 refills | Status: DC | PRN

## 2021-02-16 NOTE — Progress Notes (Signed)
Subjective:    Patient ID: Kelsey Wilson, female    DOB: 1961/03/17, 60 y.o.   MRN: [id number]  HPI: Kelsey Wilson is a 60 y.o. female who returns for follow up appointment for chronic pain and medication refill. She states her pain is located in  her lower back radiating into her right lower extremity, bilateral hips and right knee pain r. She rates her pain 7. Her current exercise regime is walking and performing stretching exercises.  [name] Morphine equivalent is 20.00 MME.  She is also prescribed Alprazolam  by  Selina Cooley. We have discussed the black box warning of using opioids and benzodiazepines. I highlighted the dangers of using these drugs together and discussed the adverse events including respiratory suppression, overdose, cognitive impairment and importance of compliance with current regimen. We will continue to monitor and adjust as indicated.   Last UDS was Performed on 08/11/2020, it was consistent.     Pain Inventory Average Pain 7 Pain Right Now 7 My pain is aching  In the last 24 hours, has pain interfered with the following? General activity 1 Relation with others 2 Enjoyment of life 2 What TIME of day is your pain at its worst? morning , daytime, evening, and night Sleep (in general) Fair  Pain is worse with: walking, bending, sitting, inactivity, and standing Pain improves with: rest, heat/ice, and medication Relief from Meds: 2  Family History  Problem Relation Age of Onset   Hypertension Mother    Diabetes Mother    Hypertension Father    Diabetes Father    Cancer Paternal Grandmother        unknown   Breast cancer Paternal Aunt    Hypertension Sister    Hyperlipidemia Brother    Colon cancer Neg Hx    Social History   Socioeconomic History   Marital status: Single    Spouse name: Not on file   Number of children: 3   Years of education: Not on file   Highest education level: Not on file  Occupational History   Not on file   Tobacco Use   Smoking status: Never   Smokeless tobacco: Never  Vaping Use   Vaping Use: Never used  Substance and Sexual Activity   Alcohol use: No    Alcohol/week: 0.0 standard drinks   Drug use: No   Sexual activity: Never    Birth control/protection: Post-menopausal, Surgical  Other Topics Concern   Not on file  Social History Narrative   Not on file   Social Determinants of Health   Financial Resource Strain: Not on file  Food Insecurity: Not on file  Transportation Needs: Not on file  Physical Activity: Not on file  Stress: Not on file  Social Connections: Not on file   Past Surgical History:  Procedure Laterality Date   ABDOMINAL HYSTERECTOMY     still has ovaries   APPENDECTOMY     AXILLARY LYMPH NODE DISSECTION  11/28/2011   Procedure: AXILLARY LYMPH NODE DISSECTION;  Surgeon: Edward Jolly, MD;  Location: [facility];  Service: General;  Laterality: Left;   BREAST LUMPECTOMY Left 11/28/2011   Malignant   BREAST SURGERY Left 2013   CARPAL TUNNEL RELEASE     Bilateral   CESAREAN SECTION     pt. has had 3   CHOLECYSTECTOMY     COLONOSCOPY     COLONOSCOPY WITH PROPOFOL N/A 01/24/2021   Procedure: COLONOSCOPY WITH PROPOFOL;  Surgeon: Nelida Meuse  III, MD;  [facility];  Service: Gastroenterology;  Laterality: N/A;   ESOPHAGOGASTRODUODENOSCOPY (EGD) WITH PROPOFOL N/A 01/24/2021   Procedure: ESOPHAGOGASTRODUODENOSCOPY (EGD) WITH PROPOFOL;  Surgeon: Doran Stabler, MD;  [facility]: [facility];  Service: Gastroenterology;  Laterality: N/A;   EYE SURGERY Bilateral    cataract removal   KNEE SURGERY     Left Knee   LAMINECTOMY WITH POSTERIOR LATERAL ARTHRODESIS LEVEL 1 N/A 11/29/2017   Procedure: Posterior Lateral Fusion - Lumbar Four-Lumbar Five, removal and replacement of hardware, Laminectomy - Lumbar Four-Lumbar Five;  Surgeon: Eustace Moore, MD;  Location: [facility];  Service: Neurosurgery;  Laterality: N/A;   LAMINECTOMY WITH POSTERIOR LATERAL  ARTHRODESIS LEVEL 2 N/A 06/07/2017   Procedure: Posterior Lateral Fusion Lumbar Three-Four and Transforaminal Interbody Fusion Lumbar Four-Five with Segmental  Pedicle Screw Fixation;  Surgeon: Eustace Moore, MD;  Location: [place];  Service: Neurosurgery;  Laterality: N/A;  Posterior Lateral Fusion Lumbar Three-Four and Transforaminal Interbody Fusion Lumbar Four-Five with Segmental  Pedicle Screw Fixation    LUMBAR FUSION  06/07/2017   POST   LUMBAR LAMINECTOMY/DECOMPRESSION MICRODISCECTOMY Bilateral 01/27/2016   Procedure: Laminectomy and Foraminotomy - Lumbar four -Lumbar five - bilateral- on-lay noninstrumented fusion;  Surgeon: Eustace Moore, MD;  Location: [facility];  Service: Neurosurgery;  Laterality: Bilateral;   MULTIPLE EXTRACTIONS WITH ALVEOLOPLASTY N/A 05/11/2016   Procedure: EXTRACTION OF TEETH EIGHTEEN, TWENTY AND TWENTY- NINE;  REMOVAL OF MANDIBULAR TORUS AND EXOSTOSIS;  Surgeon: Diona Browner, DDS;  Location: [place];  Service: Oral Surgery;  Laterality: N/A;   POLYPECTOMY  01/24/2021   Procedure: POLYPECTOMY;  Surgeon: Doran Stabler, MD;  Location: [facility];  Service: Gastroenterology;;   PORTACATH PLACEMENT  06/18/2011   Procedure: INSERTION PORT-A-CATH;  Surgeon: Edward Jolly, MD;  Location: [facility];  Service: General;  Laterality: Right;  right subclavian   removal portacath  2014   spur     Apex spur on both big toes   TOE SURGERY Bilateral    TONSILLECTOMY     TOTAL KNEE ARTHROPLASTY Left 09/13/2014   TOTAL KNEE ARTHROPLASTY Left 09/13/2014   Procedure: LEFT TOTAL KNEE ARTHROPLASTY;  Surgeon: Rod Can, MD;  Location: [facility];  Service: Orthopedics;  Laterality: Left;   TOTAL KNEE ARTHROPLASTY Right 03/10/2015   Procedure: RIGHT TOTAL KNEE ARTHROPLASTY;  Surgeon: Rod Can, MD;  [facility];  Service: Orthopedics;  Laterality: Right;   Past Surgical History:  Procedure Laterality Date   ABDOMINAL HYSTERECTOMY     still has  ovaries   APPENDECTOMY     AXILLARY LYMPH NODE DISSECTION  11/28/2011   Procedure: AXILLARY LYMPH NODE DISSECTION;  Surgeon: Edward Jolly, MD;  Location: [facility];  Service: General;  Laterality: Left;   BREAST LUMPECTOMY Left 11/28/2011   Malignant   BREAST SURGERY Left 2013   CARPAL TUNNEL RELEASE     Bilateral   CESAREAN SECTION     pt. has had 3   CHOLECYSTECTOMY     COLONOSCOPY     COLONOSCOPY WITH PROPOFOL N/A 01/24/2021   Procedure: COLONOSCOPY WITH PROPOFOL;  Surgeon: Doran Stabler, MD;  [facility];  Service: Gastroenterology;  Laterality: N/A;   ESOPHAGOGASTRODUODENOSCOPY (EGD) WITH PROPOFOL N/A 01/24/2021   Procedure: ESOPHAGOGASTRODUODENOSCOPY (EGD) WITH PROPOFOL;  Surgeon: Doran Stabler, MD;  [facility];  Service: Gastroenterology;  Laterality: N/A;   EYE SURGERY Bilateral    cataract removal   KNEE SURGERY  Left Knee   LAMINECTOMY WITH POSTERIOR LATERAL ARTHRODESIS LEVEL 1 N/A 11/29/2017   Procedure: Posterior Lateral Fusion - Lumbar Four-Lumbar Five, removal and replacement of hardware, Laminectomy - Lumbar Four-Lumbar Five;  Surgeon: Eustace Moore, MD;  Location: [facility] OR;  Service: Neurosurgery;  Laterality: N/A;   LAMINECTOMY WITH POSTERIOR LATERAL ARTHRODESIS LEVEL 2 N/A 06/07/2017   Procedure: Posterior Lateral Fusion Lumbar Three-Four and Transforaminal Interbody Fusion Lumbar Four-Five with Segmental  Pedicle Screw Fixation;  Surgeon: Eustace Moore, MD;  Location: [facility];  Service: Neurosurgery;  Laterality: N/A;  Posterior Lateral Fusion Lumbar Three-Four and Transforaminal Interbody Fusion Lumbar Four-Five with Segmental  Pedicle Screw Fixation    LUMBAR FUSION  06/07/2017   POST   LUMBAR LAMINECTOMY/DECOMPRESSION MICRODISCECTOMY Bilateral 01/27/2016   Procedure: Laminectomy and Foraminotomy - Lumbar four -Lumbar five - bilateral- on-lay noninstrumented fusion;  Surgeon: Eustace Moore, MD;  Location: [place] [facility];  Service:  Neurosurgery;  Laterality: Bilateral;   MULTIPLE EXTRACTIONS WITH ALVEOLOPLASTY N/A 05/11/2016   Procedure: EXTRACTION OF TEETH EIGHTEEN, TWENTY AND TWENTY- NINE;  REMOVAL OF MANDIBULAR TORUS AND EXOSTOSIS;  Surgeon: Diona Browner, DDS;  Location: [facility];  Service: Oral Surgery;  Laterality: N/A;   POLYPECTOMY  01/24/2021   Procedure: POLYPECTOMY;  Surgeon: Doran Stabler, MD;  Location[facility];  Service: Gastroenterology;;   PORTACATH PLACEMENT  06/18/2011   Procedure: INSERTION PORT-A-CATH;  Surgeon: Edward Jolly, MD;  Location: [place];  Service: General;  Laterality: Right;  right subclavian   removal portacath  2014   spur     Apex spur on both big toes   TOE SURGERY Bilateral    TONSILLECTOMY     TOTAL KNEE ARTHROPLASTY Left 09/13/2014   TOTAL KNEE ARTHROPLASTY Left 09/13/2014   Procedure: LEFT TOTAL KNEE ARTHROPLASTY;  Surgeon: Rod Can, MD;  Location: [place];  Service: Orthopedics;  Laterality: Left;   TOTAL KNEE ARTHROPLASTY Right 03/10/2015   Procedure: RIGHT TOTAL KNEE ARTHROPLASTY;  Surgeon: Rod Can, MD;  [facility];  Service: Orthopedics;  Laterality: Right;   Past Medical History:  Diagnosis Date   Anemia    Anxiety    Arthritis    Breast cancer ([place]) 2010   T3N1 invasive ductal carcinoma left breast.Takes Arimidex daily   Bursitis    Carpal tunnel syndrome    Chronic back pain    stenosis   Constipation    takes Colace daily   Depression    takes Benzotropine daily   Diverticulitis of colon    Dyspnea    daily when walking for over 1 yr.   Fibromyalgia 08/2012   GERD (gastroesophageal reflux disease)    takes Dexilant daily   Hemorrhoid    History of blood transfusion    no abnormal reaction noted   History of colon polyps    benign   History of shingles    Joint pain    Joint swelling    Morbid obesity (HCC)    Night muscle spasms    takes Flexeril nightly as needed   Nocturia    OSA (obstructive sleep  apnea)    OSA on CPAP    Peripheral edema    takes Furosemide.Just started 01/18/16   Peripheral neuropathy    takes Lyrica daily   Personal history of chemotherapy    2013   Personal history of radiation therapy    2013   Pneumonia    hx of-2015   Pseudoarthrosis of lumbar  spine    Seasonal allergies    takes Singulair nightly   SOB (shortness of breath) on exertion    rarely with exertion   Splenorenal shunt malfunction (HCC)    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 Alphonzo Grieve)   Type II diabetes mellitus ([place])    There were no vitals taken for this visit.  Opioid Risk Score:   Fall Risk Score:  `1  Depression screen PHQ 2/9  Depression screen [facility] 2/9 08/04/2018 06/06/2018 04/04/2018 01/13/2018 12/13/2017 06/21/2017 03/07/2017  Decreased Interest 0 0 0 '3 3 1 2  '$ Down, Depressed, Hopeless 0 0 0 '3 3 1 2  '$ PHQ - 2 Score 0 0 0 '6 6 2 4  '$ Altered sleeping - - - - - - 0  Tired, decreased energy - - - - - - 3  Change in appetite - - - - - - 3  Feeling bad or failure about yourself  - - - - - - 2  Trouble concentrating - - - - - - 3  Moving slowly or fidgety/restless - - - - - - 3  Suicidal thoughts - - - - - - 0  PHQ-9 Score - - - - - - 18  Difficult doing work/chores - - - - - - Extremely dIfficult  Some recent data might be hidden     Review of Systems  Musculoskeletal:  Positive for back pain.       Knee pain  All other systems reviewed and are negative.     Objective:   Physical Exam Vitals and nursing note reviewed.  Constitutional:      Appearance: Normal appearance.  Cardiovascular:     Rate and Rhythm: Normal rate and regular rhythm.     Pulses: Normal pulses.     Heart sounds: Normal heart sounds.  Pulmonary:     Effort: Pulmonary effort is normal.     Breath sounds: Normal breath sounds.  Musculoskeletal:     Cervical back: Normal range of motion and neck supple.     Comments: Normal  Muscle Bulk and Muscle Testing Reveals:  Upper Extremities: Full ROM and Muscle Strength 5/5 Lumbar Paraspinal Tenderness: L-3-L-5 Bilateral Greater Trochanter Tenderness Lower Extremities: Right Lower Extremity: Decreased ROM and Muscle Strength 5/5 Right Lower Extremity Flexion Produces Pain into her Patella Left Lower Extremity: Full ROM and Muscle Strength 5/5 Arises from Table Slowly  Antalgic Gait     Skin:    General: Skin is warm and dry.  Neurological:     Mental Status: She is alert and oriented to person, place, and time.  Psychiatric:        Mood and Affect: Mood normal.        Behavior: Behavior normal.         Assessment & Plan:  1. Chronic Sacroiliac Joint Pain: S/P Bilateral Sacroilliac Injection with Dr. Letta Pate on 06/23/2019. With good relief noted. 02/16/2021. 2.. Lumbar Postlaminectomy/ S/P Lumbar Fusion:Spinal Stenosis:S/P Posterior Lateral Fusion L-4-L-5 removal and replacement of hardware, Laminectomy L4-L5 by Dr. Ronnald Ramp on 05/22/2018. Continue HEP as Tolerated and Continue current medication regimen. 02/16/2021   Refill: Tramadol 50 mg one tablet every 6 hours as needed for pain #120.  We will continue the opioid monitoring program, this consists of regular clinic visits, examinations, urine drug screen, pill counts as well as use of [place] Controlled Substance Reporting system. A 12 month History has been reviewed on  the [place] Controlled Substance Reporting System on 02/16/2021. 3. Lumbar Radiculitis: No complaints today. Continue current medication regime with Gabapentin. 02/16/2021 4. Fibromyalgia: Continue HEP as Tolerated. Continue Current Medication Regimen with Gabapentin. 02/16/2021 5. Left Greater Trochanter Bursitis: Continue to Alternate Ice and Heat Therapy.Continue to monitor.  02/16/2021. 6. Muscle Spasm: Continue: Flexeril. Continue to Monitor. 02/16/2021. 7. Chronic Pain Syndrome: Continue Current medication regimen and HEP as  Tolerated. Continue to Monitor. 02/16/2021.  8. Bilateral OA of Bilateral Knees: Orthopedist following.  Continue to Monitor. 09/15/202 9. Bilateral Hand Pain: No complaints today. Continue to Monitor/ ? OA: Continue to Monitor. 02/16/2021 10. Cervicalgia: No complaints Today. Continue to alternate Heat/Ice Therapy. Continue HEP as Tolerated. 02/16/2021. 11. Chronic Bilateral Shoulders: No complaints today. Continue to alternate Ice/ Heat therapy and HEP as Tolerated. 02/16/2021.  12. Bilateral Ankle Pain: Podiatry Following: No complaints today.Continue to Monitor. 02/16/2021   13. Bilateral Fingers with Neuropathic Pain: Contin Gabapentin. Educated on following diabetic diet and tight blood sugar control, she verbalizes understanding. 02/16/2021    F/U in 6 months

## 2021-03-09 ENCOUNTER — Ambulatory Visit (INDEPENDENT_AMBULATORY_CARE_PROVIDER_SITE_OTHER): Payer: Medicare HMO

## 2021-03-09 ENCOUNTER — Encounter: Payer: Self-pay | Admitting: Sports Medicine

## 2021-03-09 ENCOUNTER — Ambulatory Visit (INDEPENDENT_AMBULATORY_CARE_PROVIDER_SITE_OTHER): Payer: Medicare HMO | Admitting: Sports Medicine

## 2021-03-09 ENCOUNTER — Other Ambulatory Visit: Payer: Self-pay | Admitting: Sports Medicine

## 2021-03-09 ENCOUNTER — Other Ambulatory Visit: Payer: Self-pay

## 2021-03-09 DIAGNOSIS — B351 Tinea unguium: Secondary | ICD-10-CM

## 2021-03-09 DIAGNOSIS — M19072 Primary osteoarthritis, left ankle and foot: Secondary | ICD-10-CM | POA: Diagnosis not present

## 2021-03-09 DIAGNOSIS — M19071 Primary osteoarthritis, right ankle and foot: Secondary | ICD-10-CM

## 2021-03-09 DIAGNOSIS — M79675 Pain in left toe(s): Secondary | ICD-10-CM

## 2021-03-09 DIAGNOSIS — M79674 Pain in right toe(s): Secondary | ICD-10-CM | POA: Diagnosis not present

## 2021-03-09 DIAGNOSIS — E1149 Type 2 diabetes mellitus with other diabetic neurological complication: Secondary | ICD-10-CM | POA: Diagnosis not present

## 2021-03-09 DIAGNOSIS — M19079 Primary osteoarthritis, unspecified ankle and foot: Secondary | ICD-10-CM

## 2021-03-09 DIAGNOSIS — M25571 Pain in right ankle and joints of right foot: Secondary | ICD-10-CM | POA: Diagnosis not present

## 2021-03-09 DIAGNOSIS — G8929 Other chronic pain: Secondary | ICD-10-CM | POA: Diagnosis not present

## 2021-03-09 DIAGNOSIS — M25572 Pain in left ankle and joints of left foot: Secondary | ICD-10-CM

## 2021-03-09 MED ORDER — PREDNISONE 10 MG (21) PO TBPK: ORAL_TABLET | ORAL | 0 refills | Status: DC

## 2021-03-09 NOTE — Progress Notes (Signed)
Subjective: Kelsey Wilson is a 60 y.o. female patient with history of diabetes who presents to office today complaining of long,mildly painful nails  while ambulating in shoes; unable to trim. Patient states that her ankles still bother her and that previous injections did not help. No other issues noted.   Patient Active Problem List   Diagnosis Date Noted   Morbid obesity ([place])    Type II diabetes mellitus ([place])    GERD (gastroesophageal reflux disease) 06/21/2019   Insomnia 03/03/2019   Ankle arthritis 12/12/2018   Muscle weakness 12/12/2018   Obstructive sleep apnea 10/23/2018   Asthmatic bronchitis 10/23/2018   Cellulitis of internal cheek, left 01/23/2018   Lung nodule 09/15/2017   Morbid obesity with BMI of 45.0-49.9, adult ([place]) 09/05/2017   Arthritis of carpometacarpal (CMC) joints of both thumbs 03/14/2017   Spinal stenosis, lumbar region, with neurogenic claudication 03/07/2017   Vertigo 10/12/2016   Peripheral positional vertigo 08/28/2016   Abnormal auditory perception of both ears 07/27/2016   Neuropathic pain 06/25/2016   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   S/P lumbar spinal fusion 01/27/2016   H/O therapeutic radiation 09/21/2015   Personal history of breast cancer 09/21/2015   Pes planus of both feet 08/04/2015   Primary osteoarthritis of right knee 03/10/2015   Osteoarthritis of right knee 02/04/2015   Primary osteoarthritis of left knee 09/13/2014   Hot flashes 01/19/2014   Abdominal pain, unspecified site 01/19/2014   Malignant neoplasm of upper-outer quadrant of left breast in female, estrogen receptor positive ([place]) 03/19/2013   Lumbosacral spondylosis without myelopathy 12/30/2012   Fibromyalgia syndrome 08/15/2012   Peripheral neuropathy, toxic 08/15/2012   Lymphedema of arm - left 04/30/2012   Current Outpatient Medications on File Prior to Visit  Medication Sig Dispense Refill   ACCU-CHEK AVIVA PLUS test strip TEST WHILE FASTING AND 2 HOURS AFTER  SUPPER     Accu-Chek Softclix Lancets lancets TEST AS DIRECTED AND 2 HOURS AFTER SUPPER     ALPRAZolam (XANAX) 0.5 MG tablet Take 0.5 mg by mouth at bedtime.     B-D ULTRAFINE III SHORT PEN 31G X 8 MM MISC USE ONE NEEDLE D     benzonatate (TESSALON) 100 MG capsule Take 2 capsules (200 mg total) by mouth 3 (three) times daily as needed for cough. (Patient not taking: No sig reported) 40 capsule 12   clindamycin (CLEOCIN) 300 MG capsule Take 600 mg by mouth once as needed.     cyclobenzaprine (FLEXERIL) 5 MG tablet TAKE 1 TABLET BY MOUTH TWICE A DAY AS NEEDED FOR MUSCLE SPASMS 60 tablet 3   DEXILANT 30 MG capsule Take 30 mg by mouth daily.     dicyclomine (BENTYL) 10 MG capsule Take 1 capsule (10 mg total) by mouth 3 (three) times daily as needed for spasms (abdominal pain). 30 capsule 0   DULoxetine (CYMBALTA) 60 MG capsule Take 60 mg by mouth daily.      furosemide (LASIX) 20 MG tablet Take 20 mg by mouth daily.     gabapentin (NEURONTIN) 300 MG capsule TAKE 1 CAPSULE BY MOUTH THREE TIMES A DAY 90 capsule 5   glipiZIDE (GLUCOTROL) 10 MG tablet Take 10 mg by mouth daily.     levocetirizine (XYZAL) 5 MG tablet Take 5 mg by mouth daily.     lisinopril (ZESTRIL) 5 MG tablet Take 5 mg by mouth daily.     metoprolol succinate (TOPROL-XL) 25 MG 24 hr tablet Take 12.5 mg by mouth daily.  montelukast (SINGULAIR) 10 MG tablet Take 10 mg by mouth at bedtime.     naloxone (NARCAN) nasal spray 4 mg/0.1 mL Place 1 spray into the nose daily as needed (Overdose).     ondansetron (ZOFRAN-ODT) 4 MG disintegrating tablet Dissolve one tablet on the tongue every 8 hours as needed for nausea and vomiting. 30 tablet 0   rosuvastatin (CRESTOR) 10 MG tablet Take 10 mg by mouth at bedtime.     SUPREP BOWEL PREP KIT 17.5-3.13-1.6 GM/177ML SOLN Take 1 kit by mouth as directed. For colonoscopy prep 354 mL 0   temazepam (RESTORIL) 15 MG capsule Take 1 capsule (15 mg total) by mouth at bedtime as needed for sleep. 30  capsule 5   traMADol (ULTRAM) 50 MG tablet Take 1 tablet (50 mg total) by mouth every 6 (six) hours as needed. Do Not Fill Before 03/03/2020 680 tablet 5   TRULICITY 3.21 YY/4.8GN SOPN Inject 0.75 mg into the skin once a week.     umeclidinium-vilanterol (ANORO ELLIPTA) 62.5-25 MCG/INH AEPB Inhale 1 puff into the lungs daily. 1 each 12   Valbenazine Tosylate 40 MG CAPS Take 40 mg by mouth daily. [place]     Current Facility-Administered Medications on File Prior to Visit  Medication Dose Route Frequency Provider Last Rate Last Admin   triamcinolone acetonide (KENALOG) 10 MG/ML injection 10 mg  10 mg Other Once Landis Martins, DPM       Allergies  Allergen Reactions   Aleve [Naproxen] Nausea Only   Compazine [Prochlorperazine] Other (See Comments)    Numbness of face and  lips    Hydrocodone Itching    High dose only   Oxycodone Other (See Comments)    hallucinations    Penicillins Nausea Only and Other (See Comments)    Has patient had a PCN reaction causing immediate rash, facial/tongue/throat swelling, SOB or lightheadedness with hypotension: No Has patient had a PCN reaction causing severe rash involving mucus membranes or skin necrosis: No Has patient had a PCN reaction that required hospitalization No Has patient had a PCN reaction occurring within the last 10 years: No If all of the above answers are "NO", then may proceed with Cephalosporin use.    Recent Results (from the past 2160 hour(s))  Glucose, capillary     Status: None   Collection Time: 01/24/21  8:58 AM  Result Value Ref Range   Glucose-Capillary 78 70 - 99 mg/dL    Comment: Glucose reference range applies only to samples taken after fasting for at least 8 hours.  Surgical pathology     Status: None   Collection Time: 01/24/21 10:25 AM  Result Value Ref Range   SURGICAL PATHOLOGY      SURGICAL PATHOLOGY CASE: [id number] PATIENT: [facility] Surgical Pathology Report     Clinical History:  Dysphagia, colon polyps ([facility])     FINAL MICROSCOPIC DIAGNOSIS:  A. RECTUM, POLYPECTOMY: - Tubular adenoma. - No high grade dysplasia or malignancy.   GROSS DESCRIPTION:  A.  Received in formalin labeled with the patients name and DOB is a 0.4 cm piece of tan soft tissue, submitted in toto in a single cassette.   (LEF 01/24/2021)    Final Diagnosis performed by Vicente Males, MD.   Electronically signed 01/25/2021 Technical and / or Professional components performed at [facility], [place] [address], [place], [place] [address].  Immunohistochemistry Technical component (if applicable) was performed at [facility] [address], [address], [place], [place]  [place] (if applicable): Some of these immunohistochemical stains may have been developed and the performance characte ristics determine by [facility]. Some may not have been cleared or approved by the U.S. Food and Drug Administration. The FDA has determined that such clearance or approval is not necessary. This test is used for clinical purposes. It should not be regarded as investigational or for research. This laboratory is certified under the [place] (CLIA-88) as qualified to perform high complexity clinical laboratory testing.  The controls stained appropriately.     Objective: General: Patient is awake, alert, and oriented x 3 and in no acute distress.  Integument: Skin is warm, dry and supple bilateral. Nails are tender, long, thickened and dystrophic with subungual debris, consistent with onychomycosis, 1-5 bilateral. No signs of infection. Old scar on left foot. No open lesions or preulcerative lesions present bilateral. Remaining integument unremarkable.  Vasculature:  Dorsalis Pedis pulse 1/4 bilateral. Posterior Tibial pulse  1/4 bilateral. Capillary fill time <3 sec 1-5 bilateral.  Positive hair growth to the level of the digits. Temperature gradient within normal limits. No varicosities present bilateral. No edema present bilateral.   Neurology: Subjective numbness to toes.   Musculoskeletal: Pain with palpation to the dorsal lateral foot and ankle greater than anterior and medial ankle bilateral. Pain is diffuse.  There is mild guarding with range of motion to the ankles bilateral.  Asymptomatic pes planus pedal deformities noted bilateral. Muscular strength 4/5 in all lower extremity muscular groups bilateral without pain on range of motion. No tenderness with calf compression bilateral.  Assessment and Plan: Problem List Items Addressed This Visit   None Visit Diagnoses     Chronic ankle pain, bilateral    -  Primary   Relevant Orders   DG Ankle Complete Left   DG Ankle Complete Right   Pain due to onychomycosis of toenails of both feet       Type II diabetes mellitus with neurological manifestations (HCC)       Primary osteoarthritis of both ankles       Relevant Medications   predniSONE (STERAPRED UNI-PAK 21 TAB) 10 MG (21) TBPK tablet      -Examined patient. -Re-Discussed and educated patient on diabetic foot care, especially with  regards to the vascular, neurological and musculoskeletal systems.  -Mechanically debrided all nails 1-5 bilateral using sterile nail nipper and filed with dremel without incident  -Xrays reviewed of ankles and reveals significant arthritis end-stage of both ankles -Advised patient that she will always have pain in her ankles because her bad arthritis and at this point we will try to manage her arthritis as best as we can especially since she also has arthritis in her knees and back previous back surgery that did not help -Rx Prednisone this time only to see if it will help meanwhile -Patient to return  in 2.5-3 months for at risk foot care and f/u on ankle pain; May benefit from PT in the future if still have pain -Patient  advised to call the office if any problems or questions arise in the meantime.  Landis Martins, DPM

## 2021-03-16 ENCOUNTER — Telehealth: Payer: Self-pay

## 2021-03-16 MED ORDER — CYCLOBENZAPRINE HCL 5 MG PO TABS: ORAL_TABLET | ORAL | 3 refills | Status: DC

## 2021-03-16 NOTE — Telephone Encounter (Signed)
Cyclobenzaprine Rx refilled

## 2021-03-21 DIAGNOSIS — G4733 Obstructive sleep apnea (adult) (pediatric): Secondary | ICD-10-CM | POA: Diagnosis not present

## 2021-04-21 DIAGNOSIS — C50912 Malignant neoplasm of unspecified site of left female breast: Secondary | ICD-10-CM | POA: Diagnosis not present

## 2021-05-09 DIAGNOSIS — Z23 Encounter for immunization: Secondary | ICD-10-CM | POA: Diagnosis not present

## 2021-05-09 DIAGNOSIS — K219 Gastro-esophageal reflux disease without esophagitis: Secondary | ICD-10-CM | POA: Diagnosis not present

## 2021-05-09 DIAGNOSIS — I10 Essential (primary) hypertension: Secondary | ICD-10-CM | POA: Diagnosis not present

## 2021-05-09 DIAGNOSIS — E1142 Type 2 diabetes mellitus with diabetic polyneuropathy: Secondary | ICD-10-CM | POA: Diagnosis not present

## 2021-05-09 DIAGNOSIS — G4700 Insomnia, unspecified: Secondary | ICD-10-CM | POA: Diagnosis not present

## 2021-05-09 DIAGNOSIS — E1121 Type 2 diabetes mellitus with diabetic nephropathy: Secondary | ICD-10-CM | POA: Diagnosis not present

## 2021-05-09 DIAGNOSIS — J449 Chronic obstructive pulmonary disease, unspecified: Secondary | ICD-10-CM | POA: Diagnosis not present

## 2021-05-09 DIAGNOSIS — E785 Hyperlipidemia, unspecified: Secondary | ICD-10-CM | POA: Diagnosis not present

## 2021-05-09 DIAGNOSIS — E559 Vitamin D deficiency, unspecified: Secondary | ICD-10-CM | POA: Diagnosis not present

## 2021-05-09 DIAGNOSIS — G2401 Drug induced subacute dyskinesia: Secondary | ICD-10-CM | POA: Diagnosis not present

## 2021-05-10 DIAGNOSIS — E1121 Type 2 diabetes mellitus with diabetic nephropathy: Secondary | ICD-10-CM | POA: Diagnosis not present

## 2021-05-10 DIAGNOSIS — E785 Hyperlipidemia, unspecified: Secondary | ICD-10-CM | POA: Diagnosis not present

## 2021-05-10 DIAGNOSIS — E559 Vitamin D deficiency, unspecified: Secondary | ICD-10-CM | POA: Diagnosis not present

## 2021-06-15 ENCOUNTER — Other Ambulatory Visit: Payer: Self-pay | Admitting: Nurse Practitioner

## 2021-06-15 ENCOUNTER — Encounter: Payer: Self-pay | Admitting: Sports Medicine

## 2021-06-15 ENCOUNTER — Ambulatory Visit (INDEPENDENT_AMBULATORY_CARE_PROVIDER_SITE_OTHER): Payer: Medicare HMO | Admitting: Sports Medicine

## 2021-06-15 ENCOUNTER — Other Ambulatory Visit: Payer: Self-pay

## 2021-06-15 DIAGNOSIS — B351 Tinea unguium: Secondary | ICD-10-CM

## 2021-06-15 DIAGNOSIS — G8929 Other chronic pain: Secondary | ICD-10-CM

## 2021-06-15 DIAGNOSIS — M25572 Pain in left ankle and joints of left foot: Secondary | ICD-10-CM

## 2021-06-15 DIAGNOSIS — M79675 Pain in left toe(s): Secondary | ICD-10-CM | POA: Diagnosis not present

## 2021-06-15 DIAGNOSIS — M25571 Pain in right ankle and joints of right foot: Secondary | ICD-10-CM

## 2021-06-15 DIAGNOSIS — Z1231 Encounter for screening mammogram for malignant neoplasm of breast: Secondary | ICD-10-CM

## 2021-06-15 DIAGNOSIS — M79674 Pain in right toe(s): Secondary | ICD-10-CM | POA: Diagnosis not present

## 2021-06-15 DIAGNOSIS — E1149 Type 2 diabetes mellitus with other diabetic neurological complication: Secondary | ICD-10-CM

## 2021-06-15 NOTE — Progress Notes (Signed)
Subjective: Kelsey Wilson is a 61 y.o. female patient with history of diabetes who presents to office today complaining of long,mildly painful nails  while ambulating in shoes; unable to trim. Patient states that her ankles still bother her and previous treatments have not helped still gets pain and also increasing pain to the bottom which is likely consistent with neuropathy.  Patient Active Problem List   Diagnosis Date Noted   Morbid obesity ([place])    Type II diabetes mellitus ([place])    GERD (gastroesophageal reflux disease) 06/21/2019   Insomnia 03/03/2019   Ankle arthritis 12/12/2018   Muscle weakness 12/12/2018   Obstructive sleep apnea 10/23/2018   Asthmatic bronchitis 10/23/2018   Cellulitis of internal cheek, left 01/23/2018   Lung nodule 09/15/2017   Morbid obesity with BMI of 45.0-49.9, adult ([place]) 09/05/2017   Arthritis of carpometacarpal (CMC) joints of both thumbs 03/14/2017   Spinal stenosis, lumbar region, with neurogenic claudication 03/07/2017   Vertigo 10/12/2016   Peripheral positional vertigo 08/28/2016   Abnormal auditory perception of both ears 07/27/2016   Neuropathic pain 06/25/2016   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   S/P lumbar spinal fusion 01/27/2016   H/O therapeutic radiation 09/21/2015   Personal history of breast cancer 09/21/2015   Pes planus of both feet 08/04/2015   Primary osteoarthritis of right knee 03/10/2015   Osteoarthritis of right knee 02/04/2015   Primary osteoarthritis of left knee 09/13/2014   Hot flashes 01/19/2014   Abdominal pain, unspecified site 01/19/2014   Malignant neoplasm of upper-outer quadrant of left breast in female, estrogen receptor positive ([place]) 03/19/2013   Lumbosacral spondylosis without myelopathy 12/30/2012   Fibromyalgia syndrome 08/15/2012   Peripheral neuropathy, toxic 08/15/2012   Lymphedema of arm - left 04/30/2012   Current Outpatient Medications on File Prior to Visit  Medication Sig Dispense Refill    ACCU-CHEK AVIVA PLUS test strip TEST WHILE FASTING AND 2 HOURS AFTER SUPPER     Accu-Chek Softclix Lancets lancets TEST AS DIRECTED AND 2 HOURS AFTER SUPPER     ALPRAZolam (XANAX) 0.5 MG tablet Take 0.5 mg by mouth at bedtime.     B-D ULTRAFINE III SHORT PEN 31G X 8 MM MISC USE ONE NEEDLE D     benzonatate (TESSALON) 100 MG capsule Take 2 capsules (200 mg total) by mouth 3 (three) times daily as needed for cough. (Patient not taking: No sig reported) 40 capsule 12   clindamycin (CLEOCIN) 300 MG capsule Take 600 mg by mouth once as needed.     cyclobenzaprine (FLEXERIL) 5 MG tablet TAKE 1 TABLET BY MOUTH TWICE A DAY AS NEEDED FOR MUSCLE SPASMS 60 tablet 3   DEXILANT 30 MG capsule Take 30 mg by mouth daily.     dicyclomine (BENTYL) 10 MG capsule Take 1 capsule (10 mg total) by mouth 3 (three) times daily as needed for spasms (abdominal pain). 30 capsule 0   DULoxetine (CYMBALTA) 60 MG capsule Take 60 mg by mouth daily.      furosemide (LASIX) 20 MG tablet Take 20 mg by mouth daily.     gabapentin (NEURONTIN) 300 MG capsule TAKE 1 CAPSULE BY MOUTH THREE TIMES A DAY 90 capsule 5   glipiZIDE (GLUCOTROL) 10 MG tablet Take 10 mg by mouth daily.     levocetirizine (XYZAL) 5 MG tablet Take 5 mg by mouth daily.     lisinopril (ZESTRIL) 5 MG tablet Take 5 mg by mouth daily.     metoprolol succinate (TOPROL-XL) 25 MG 24  hr tablet Take 12.5 mg by mouth daily.     montelukast (SINGULAIR) 10 MG tablet Take 10 mg by mouth at bedtime.     naloxone (NARCAN) nasal spray 4 mg/0.1 mL Place 1 spray into the nose daily as needed (Overdose).     ondansetron (ZOFRAN-ODT) 4 MG disintegrating tablet Dissolve one tablet on the tongue every 8 hours as needed for nausea and vomiting. 30 tablet 0   predniSONE (STERAPRED UNI-PAK 21 TAB) 10 MG (21) TBPK tablet Take as directed 21 tablet 0   rosuvastatin (CRESTOR) 10 MG tablet Take 10 mg by mouth at bedtime.     SUPREP BOWEL PREP KIT 17.5-3.13-1.6 GM/177ML SOLN Take 1 kit by  mouth as directed. For colonoscopy prep 354 mL 0   temazepam (RESTORIL) 15 MG capsule Take 1 capsule (15 mg total) by mouth at bedtime as needed for sleep. 30 capsule 5   traMADol (ULTRAM) 50 MG tablet Take 1 tablet (50 mg total) by mouth every 6 (six) hours as needed. Do Not Fill Before 03/03/2020 287 tablet 5   TRULICITY 6.81 LX/7.2IO SOPN Inject 0.75 mg into the skin once a week.     umeclidinium-vilanterol (ANORO ELLIPTA) 62.5-25 MCG/INH AEPB Inhale 1 puff into the lungs daily. 1 each 12   Valbenazine Tosylate 40 MG CAPS Take 40 mg by mouth daily. [place]     Current Facility-Administered Medications on File Prior to Visit  Medication Dose Route Frequency Provider Last Rate Last Admin   triamcinolone acetonide (KENALOG) 10 MG/ML injection 10 mg  10 mg Other Once Landis Martins, DPM       Allergies  Allergen Reactions   Aleve [Naproxen] Nausea Only   Compazine [Prochlorperazine] Other (See Comments)    Numbness of face and  lips    Hydrocodone Itching    High dose only   Oxycodone Other (See Comments)    hallucinations    Penicillins Nausea Only and Other (See Comments)    Has patient had a PCN reaction causing immediate rash, facial/tongue/throat swelling, SOB or lightheadedness with hypotension: No Has patient had a PCN reaction causing severe rash involving mucus membranes or skin necrosis: No Has patient had a PCN reaction that required hospitalization No Has patient had a PCN reaction occurring within the last 10 years: No If all of the above answers are "NO", then may proceed with Cephalosporin use.    No results found for this or any previous visit (from the past 2160 hour(s)).   Objective: General: Patient is awake, alert, and oriented x 3 and in no acute distress.  Integument: Skin is warm, dry and supple bilateral. Nails are tender, long, thickened and dystrophic with subungual debris, consistent with onychomycosis, 1-5 bilateral. No signs of infection. Old scar on  left foot. No open lesions or preulcerative lesions present bilateral. Remaining integument unremarkable.  Vasculature:  Dorsalis Pedis pulse 1/4 bilateral. Posterior Tibial pulse  1/4 bilateral. Capillary fill time <3 sec 1-5 bilateral. Positive hair growth to the level of the digits. Temperature gradient within normal limits. No varicosities present bilateral. No edema present bilateral.   Neurology: Subjective numbness to toes.   Musculoskeletal: Pain with palpation to the dorsal lateral foot and ankle greater than anterior and medial ankle bilateral. Pain is diffuse and unchanged from prior.  There is mild guarding with range of motion to the ankles bilateral.  Asymptomatic pes planus pedal deformities noted bilateral. Muscular strength 4/5 in all lower extremity muscular groups bilateral without pain on range of  motion. No tenderness with calf compression bilateral.  Assessment and Plan: Problem List Items Addressed This Visit   None Visit Diagnoses     Pain due to onychomycosis of toenails of both feet    -  Primary   Type II diabetes mellitus with neurological manifestations ([facility])       Chronic ankle pain, bilateral          -Examined patient. -Re-Discussed and educated patient on diabetic foot care, especially with  regards to the vascular, neurological and musculoskeletal systems.  -Mechanically debrided all nails 1-5 bilateral using sterile nail nipper and filed with dremel without incident  -Discussed with patient treatment options for chronic ankle pain -Patient declines physical therapy at this time she has tried in the past and it did not help -Advised patient at this time I do not have any other options for her chronic pain -Continue with good supportive shoes daily for foot type patient to see Aaron Edelman for new diabetic shoes for the year -Patient to return  in 2.5-3 months for at risk foot care  -Patient advised to call the office if any problems or questions arise in the  meantime.  Landis Martins, DPM

## 2021-06-20 DIAGNOSIS — N644 Mastodynia: Secondary | ICD-10-CM | POA: Diagnosis not present

## 2021-06-20 DIAGNOSIS — M5412 Radiculopathy, cervical region: Secondary | ICD-10-CM | POA: Diagnosis not present

## 2021-06-20 DIAGNOSIS — M25512 Pain in left shoulder: Secondary | ICD-10-CM | POA: Diagnosis not present

## 2021-06-20 DIAGNOSIS — Z853 Personal history of malignant neoplasm of breast: Secondary | ICD-10-CM | POA: Diagnosis not present

## 2021-06-20 DIAGNOSIS — M542 Cervicalgia: Secondary | ICD-10-CM | POA: Diagnosis not present

## 2021-06-21 ENCOUNTER — Other Ambulatory Visit: Payer: Self-pay

## 2021-06-21 ENCOUNTER — Ambulatory Visit: Payer: Medicare HMO

## 2021-06-21 DIAGNOSIS — E1149 Type 2 diabetes mellitus with other diabetic neurological complication: Secondary | ICD-10-CM

## 2021-06-21 NOTE — Progress Notes (Signed)
Patient does not have a treating diabetic physician. Without a diabetic physician to certify for her medicare, patient cannot receive shoes. Patient understands and will find a physician to treat her diabetes. Plan of care to resume at patient's discretion. All questions answered and concerns addressed.

## 2021-06-22 DIAGNOSIS — G4733 Obstructive sleep apnea (adult) (pediatric): Secondary | ICD-10-CM | POA: Diagnosis not present

## 2021-07-03 ENCOUNTER — Other Ambulatory Visit: Payer: Self-pay | Admitting: Nurse Practitioner

## 2021-07-03 DIAGNOSIS — N644 Mastodynia: Secondary | ICD-10-CM

## 2021-07-04 ENCOUNTER — Other Ambulatory Visit: Payer: Self-pay

## 2021-07-04 ENCOUNTER — Telehealth: Payer: Self-pay | Admitting: Registered Nurse

## 2021-07-04 ENCOUNTER — Ambulatory Visit (INDEPENDENT_AMBULATORY_CARE_PROVIDER_SITE_OTHER): Payer: Medicare HMO | Admitting: Podiatry

## 2021-07-04 DIAGNOSIS — M216X1 Other acquired deformities of right foot: Secondary | ICD-10-CM | POA: Diagnosis not present

## 2021-07-04 DIAGNOSIS — M25571 Pain in right ankle and joints of right foot: Secondary | ICD-10-CM | POA: Diagnosis not present

## 2021-07-04 DIAGNOSIS — M7752 Other enthesopathy of left foot: Secondary | ICD-10-CM

## 2021-07-04 DIAGNOSIS — M25572 Pain in left ankle and joints of left foot: Secondary | ICD-10-CM

## 2021-07-04 DIAGNOSIS — M19071 Primary osteoarthritis, right ankle and foot: Secondary | ICD-10-CM

## 2021-07-04 DIAGNOSIS — G8929 Other chronic pain: Secondary | ICD-10-CM | POA: Diagnosis not present

## 2021-07-04 DIAGNOSIS — M216X2 Other acquired deformities of left foot: Secondary | ICD-10-CM | POA: Diagnosis not present

## 2021-07-04 DIAGNOSIS — M19072 Primary osteoarthritis, left ankle and foot: Secondary | ICD-10-CM | POA: Diagnosis not present

## 2021-07-04 MED ORDER — TRIAMCINOLONE ACETONIDE 40 MG/ML IJ SUSP
20.0000 mg | Freq: Once | INTRAMUSCULAR | Status: AC
  Administered 2021-07-04: 20 mg

## 2021-07-04 MED ORDER — CYCLOBENZAPRINE HCL 5 MG PO TABS: ORAL_TABLET | ORAL | 3 refills | Status: DC

## 2021-07-04 NOTE — Telephone Encounter (Signed)
Placed a call to Kelsey Wilson,  She was requesting a refill on her Cyclobenzaprine,  Refill sent to pharmacy. She verbalizes understanding.

## 2021-07-10 NOTE — Progress Notes (Signed)
Subjective: 61 year old female presents the office today for concerns of left ankle pain.  She last saw Dr. Cannon Kettle for ankle pain previously.  She had steroid injections without significant relief she is tried Writer.  She repeatedly declined physical therapy.  I previously seen her for 5 feet, ankle pain on the right side in 2020.  No recent injury.  Objective: AAO x3, NAD DP/PT pulses palpable bilaterally, CRT less than 3 seconds Patient decreased medial arch upon weightbearing bilaterally.  The majority tenderness is localized to the lateral aspect of the left ankle and sinus tarsi and there is localized edema present.  Mild discomfort anterolateral ankle joint as well.  Ankle joint range of motion intact.  Mild discomfort along the peroneal tendon as well as the flexor tendons.  No specific area pinpoint tenderness.  MMT 5/5. No pain with calf compression, swelling, warmth, erythema  Assessment: Left ankle capsulitis, flatfoot  Plan: -All treatment options discussed with the patient including all alternatives, risks, complications.  -Reviewed prior x-rays. -Steroid injection performed to the sinus tarsi.  Skin was prepped with alcohol and 0.5 cc of Kenalog 40, 1 cc Marcaine plain was infiltrated in the sinus tarsi without complications.  Postinjection care discussed.  She tolerated well. -Tri-Lock ankle brace dispensed -MRI ordered given chronic nature of her symptoms and this is for potential surgical planning.  Previous x-rays were negative and she is attempted conservative care without resolution. -Patient encouraged to call the office with any questions, concerns, change in symptoms.   Trula Slade DPM

## 2021-07-29 ENCOUNTER — Other Ambulatory Visit: Payer: Self-pay

## 2021-07-29 ENCOUNTER — Ambulatory Visit
Admission: RE | Admit: 2021-07-29 | Discharge: 2021-07-29 | Disposition: A | Discharge status: Home / Self Care | Payer: Medicare HMO | Source: Ambulatory Visit | Attending: Podiatry | Admitting: Podiatry

## 2021-07-29 DIAGNOSIS — M19071 Primary osteoarthritis, right ankle and foot: Secondary | ICD-10-CM

## 2021-07-29 DIAGNOSIS — M19072 Primary osteoarthritis, left ankle and foot: Secondary | ICD-10-CM

## 2021-07-29 DIAGNOSIS — M7752 Other enthesopathy of left foot: Secondary | ICD-10-CM

## 2021-07-29 DIAGNOSIS — S93492A Sprain of other ligament of left ankle, initial encounter: Secondary | ICD-10-CM | POA: Diagnosis not present

## 2021-07-29 DIAGNOSIS — Z8739 Personal history of other diseases of the musculoskeletal system and connective tissue: Secondary | ICD-10-CM | POA: Diagnosis not present

## 2021-07-29 IMAGING — MR MR ANKLE*L* W/O CM
6 series · 39 of 40 positions shown · non-contrast
Comparison: None.

CLINICAL DATA: Left ankle pain and swelling for 1 year.

EXAM:
MRI OF THE LEFT ANKLE WITHOUT CONTRAST
TECHNIQUE: Multiplanar, multisequence MR imaging of the ankle was performed. No
intravenous contrast was administered.

[Series 3: T2 fat-sat · axial · 3.0mm · 0.50mm/px · z∈[-74,+58]mm · 7 of 35 slices shown (1 of 2)]
[im 1/35]
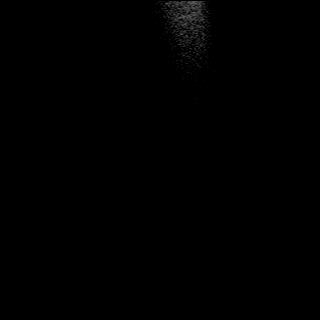
[im 6/35]
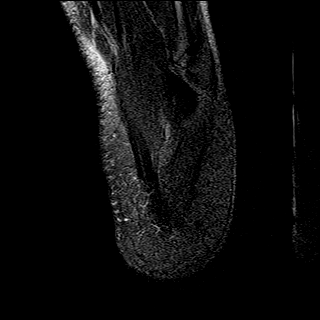
[im 12/35]
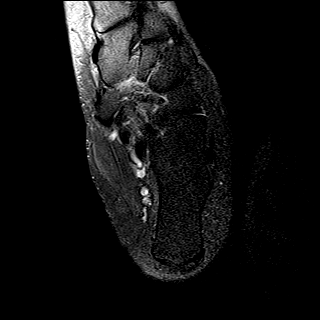
[im 18/35]
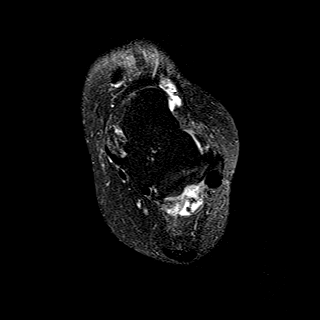
[im 23/35]
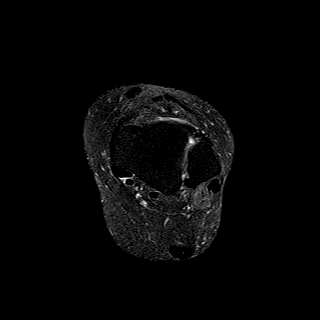
[im 29/35]
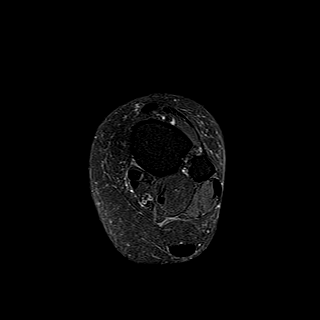
[im 35/35]
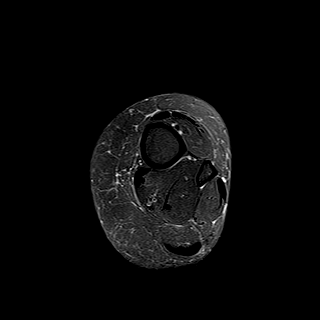

[Series 4: PD fat-sat · axial · 3.0mm · 0.42mm/px · z∈[-74,+58]mm · 8 of 35 slices shown]
[im 1/35]
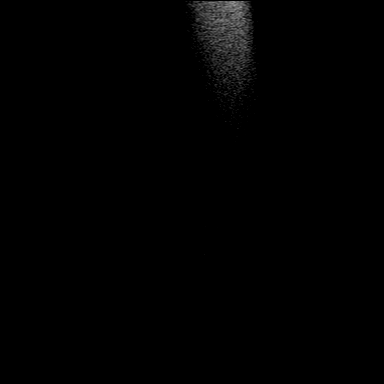
[im 5/35]
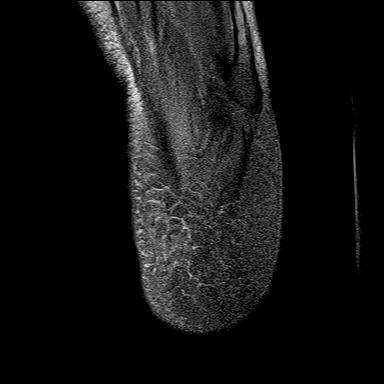
[im 10/35]
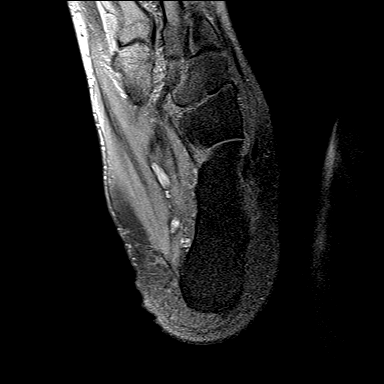
[im 15/35]
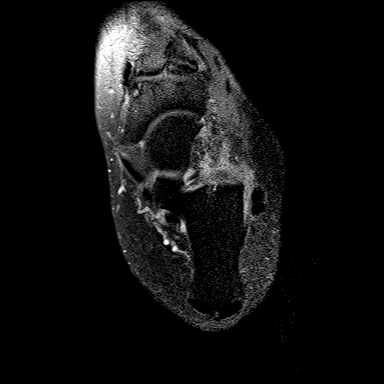
[im 20/35]
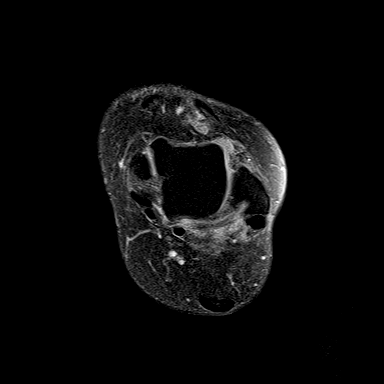
[im 25/35]
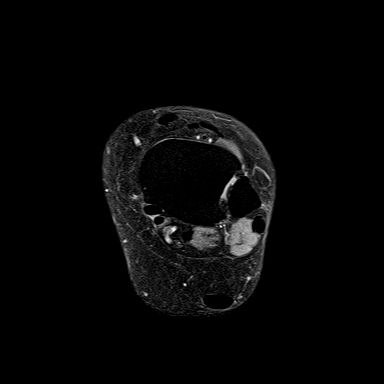
[im 30/35]
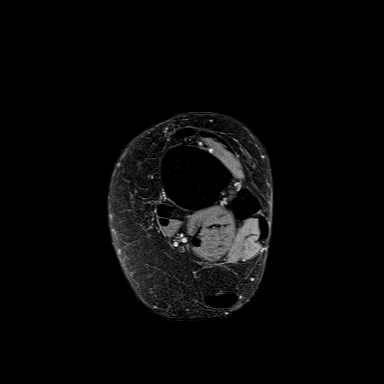
[im 35/35]
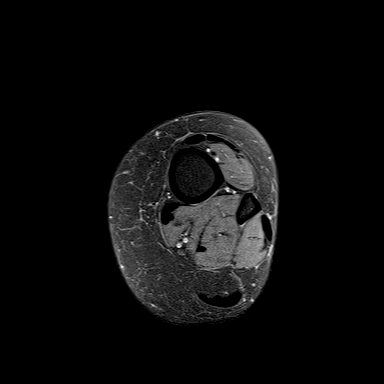

[Series 5: T1 · sagittal · 4.0mm · 0.56mm/px · 5 of 20 slices shown]
[im 1/20]
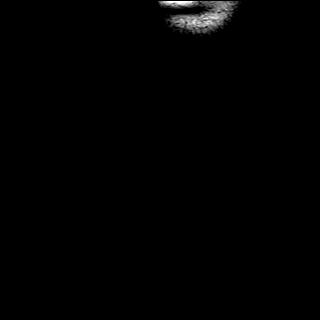
[im 5/20]
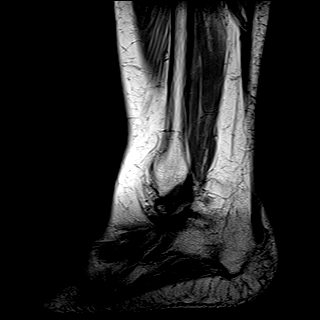
[im 10/20]
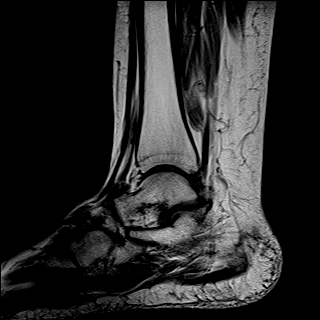
[im 15/20]
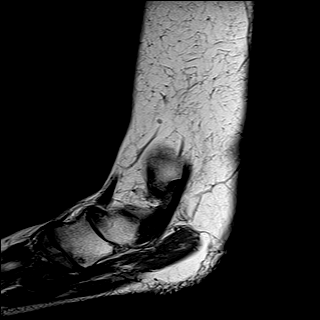
[im 20/20]
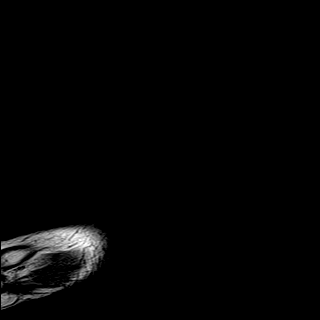

[Series 6: STIR · sagittal · 4.0mm · 0.35mm/px · 4 of 20 slices shown]
[im 1/20]
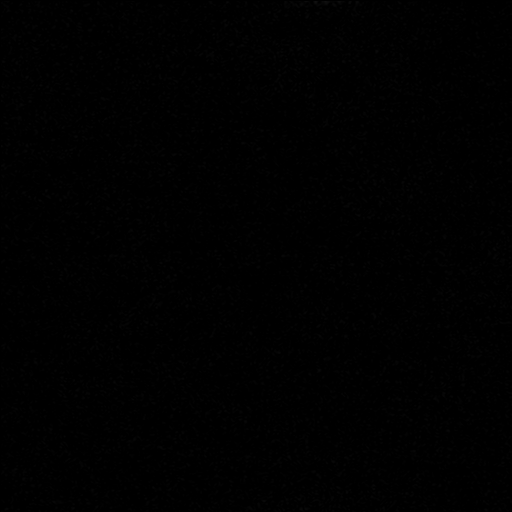
[im 5/20]
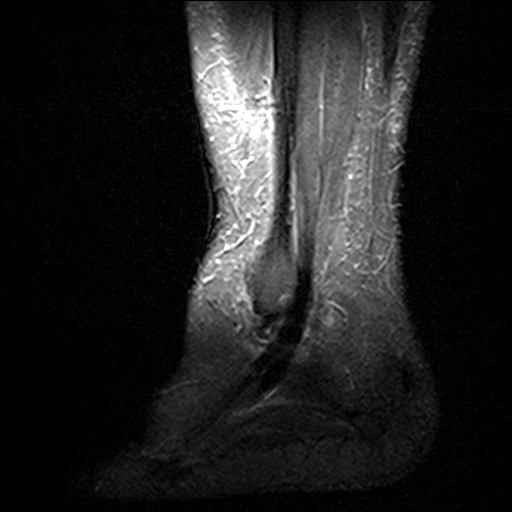
[im 10/20]
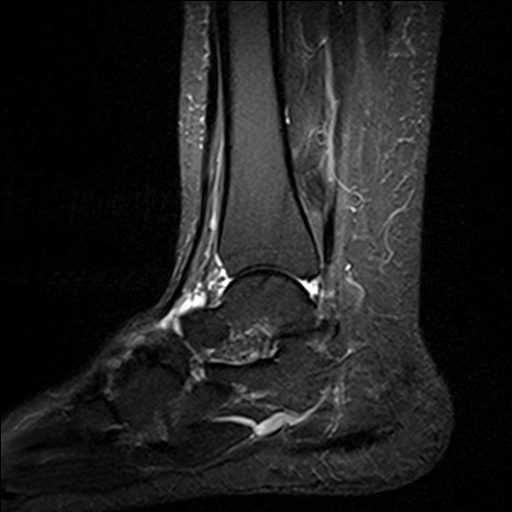
[im 15/20]
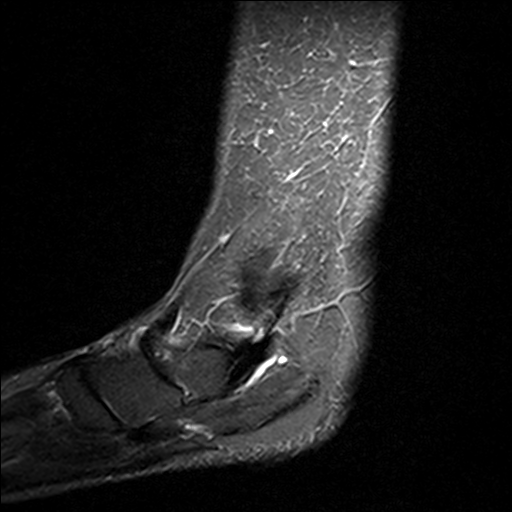

[Series 7: T2 fat-sat · coronal · 3.0mm · 0.53mm/px · 7 of 32 slices shown (2 of 2)]
[im 1/32]
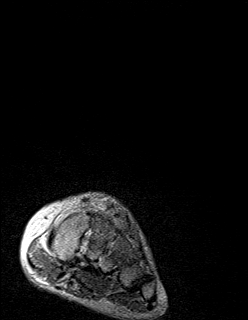
[im 6/32]
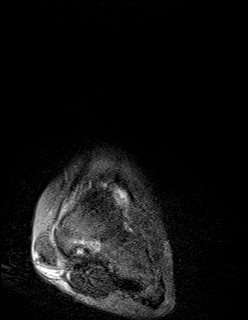
[im 11/32]
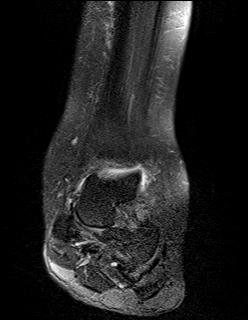
[im 16/32]
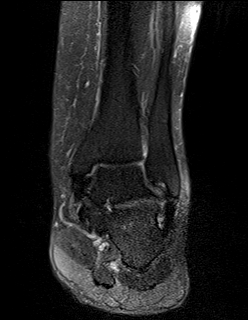
[im 21/32]
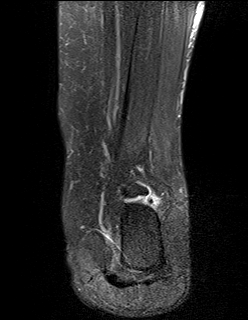
[im 26/32]
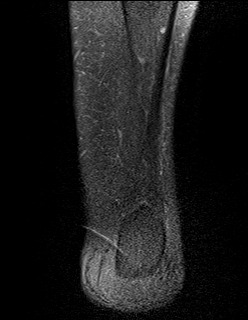
[im 32/32]
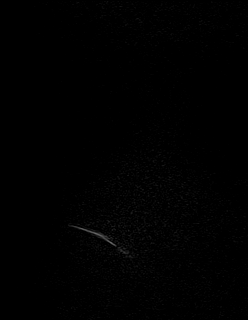

[Series 8: T1 fat-sat · axial · 3.0mm · 0.62mm/px · z∈[-74,+58]mm · 8 of 35 slices shown]
[im 1/35]
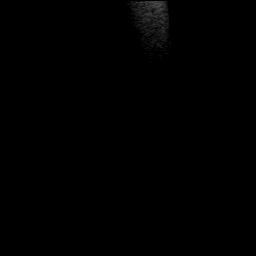
[im 5/35]
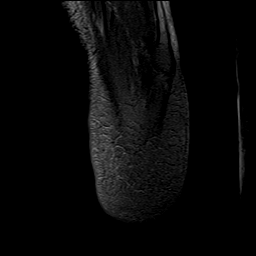
[im 10/35]
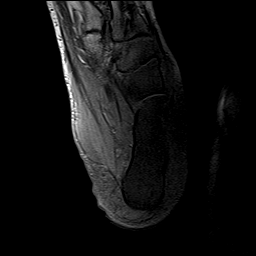
[im 15/35]
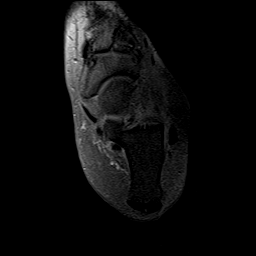
[im 20/35]
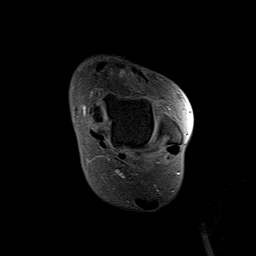
[im 25/35]
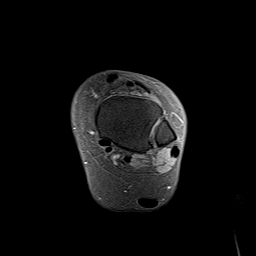
[im 30/35]
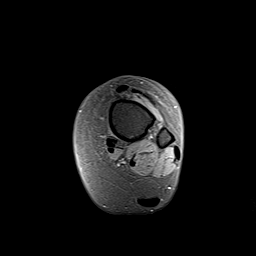
[im 35/35]
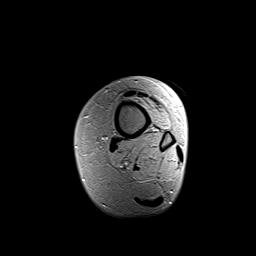

[39 of 40 positions shown; findings below may reference images not displayed]

FINDINGS: TENDONS

Peroneal: Peroneal longus tendon intact. Peroneal brevis intact.

Posteromedial: Mild tendinosis of the posterior tibial tendon with a
tiny interstitial tear (image 8/series 4). Flexor hallucis longus
tendon intact. Flexor digitorum longus tendon intact.

Anterior: Tibialis anterior tendon intact. Extensor hallucis longus
tendon intact Extensor digitorum longus tendon intact.

Achilles:  Intact.

Plantar Fascia: Intact. Small plantar calcaneal spur.

LIGAMENTS

Lateral: Complete chronic tear of the anterior talofibular ligament.
Calcaneofibular ligament intact. Posterior talofibular ligament
intact. Anterior and posterior tibiofibular ligaments intact.

Medial: Deltoid ligament intact. Spring ligament is attenuated
concerning for a tear.

CARTILAGE

Ankle Joint: No joint effusion. Normal ankle mortise. No chondral
defect.

Subtalar Joints/Sinus Tarsi: Normal subtalar joints. No subtalar
joint effusion. Normal sinus tarsi.

Bones: No acute fracture or dislocation. Mild osteoarthritis of the
navicular-medial cuneiform joint. Moderate osteoarthritis of the
second tarsometatarsal joint. Mild osteoarthritis of the
navicular-medial cuneiform and navicular-lateral cuneiform joints.
Relative pes planus.

Soft Tissue: No fluid collection or hematoma. Muscles are normal
without edema or atrophy. Tarsal tunnel is normal.
IMPRESSION: 1. Mild tendinosis of the posterior tibial tendon with a tiny
interstitial tear.
2. Complete chronic tear of the anterior talofibular ligament.
3. Spring ligament is attenuated concerning for a tear.
4. Mild osteoarthritis of the navicular-cuneiform joints.
5. Moderate osteoarthritis of the second tarsometatarsal joint.

## 2021-07-31 ENCOUNTER — Other Ambulatory Visit: Payer: Self-pay

## 2021-07-31 MED ORDER — GABAPENTIN 300 MG PO CAPS: ORAL_CAPSULE | ORAL | 5 refills | Status: DC

## 2021-08-01 ENCOUNTER — Ambulatory Visit (INDEPENDENT_AMBULATORY_CARE_PROVIDER_SITE_OTHER): Payer: Medicare HMO | Admitting: Podiatry

## 2021-08-01 ENCOUNTER — Ambulatory Visit
Admission: RE | Admit: 2021-08-01 | Discharge: 2021-08-01 | Disposition: A | Discharge status: Home / Self Care | Payer: Medicare HMO | Source: Ambulatory Visit | Attending: Nurse Practitioner | Admitting: Nurse Practitioner

## 2021-08-01 ENCOUNTER — Other Ambulatory Visit: Payer: Self-pay

## 2021-08-01 ENCOUNTER — Ambulatory Visit: Admission: RE | Admit: 2021-08-01 | Payer: Medicare HMO | Source: Ambulatory Visit

## 2021-08-01 ENCOUNTER — Other Ambulatory Visit: Payer: Self-pay | Admitting: Nurse Practitioner

## 2021-08-01 DIAGNOSIS — N644 Mastodynia: Secondary | ICD-10-CM

## 2021-08-01 DIAGNOSIS — M7751 Other enthesopathy of right foot: Secondary | ICD-10-CM | POA: Diagnosis not present

## 2021-08-01 DIAGNOSIS — N6489 Other specified disorders of breast: Secondary | ICD-10-CM

## 2021-08-01 DIAGNOSIS — R922 Inconclusive mammogram: Secondary | ICD-10-CM | POA: Diagnosis not present

## 2021-08-01 DIAGNOSIS — M7752 Other enthesopathy of left foot: Secondary | ICD-10-CM | POA: Diagnosis not present

## 2021-08-01 IMAGING — US US BREAST*R* LIMITED INC AXILLA
1 series · 3 of 3 positions shown · non-contrast
Comparison: Previous exam(s).

CLINICAL DATA: 60-year-old female with diffuse left breast pain.
History of remote treated left breast cancer.

EXAM:
DIGITAL DIAGNOSTIC BILATERAL MAMMOGRAM WITH TOMOSYNTHESIS AND CAD;
ULTRASOUND RIGHT BREAST LIMITED
TECHNIQUE: Bilateral digital diagnostic mammography and breast tomosynthesis
was performed. The images were evaluated with computer-aided
detection.; Targeted ultrasound examination of the right breast was
performed

[Series 1: us breast*right* limited inc axilla · 0.07mm/px · 3 of 3 slices shown]
[im 1/3]
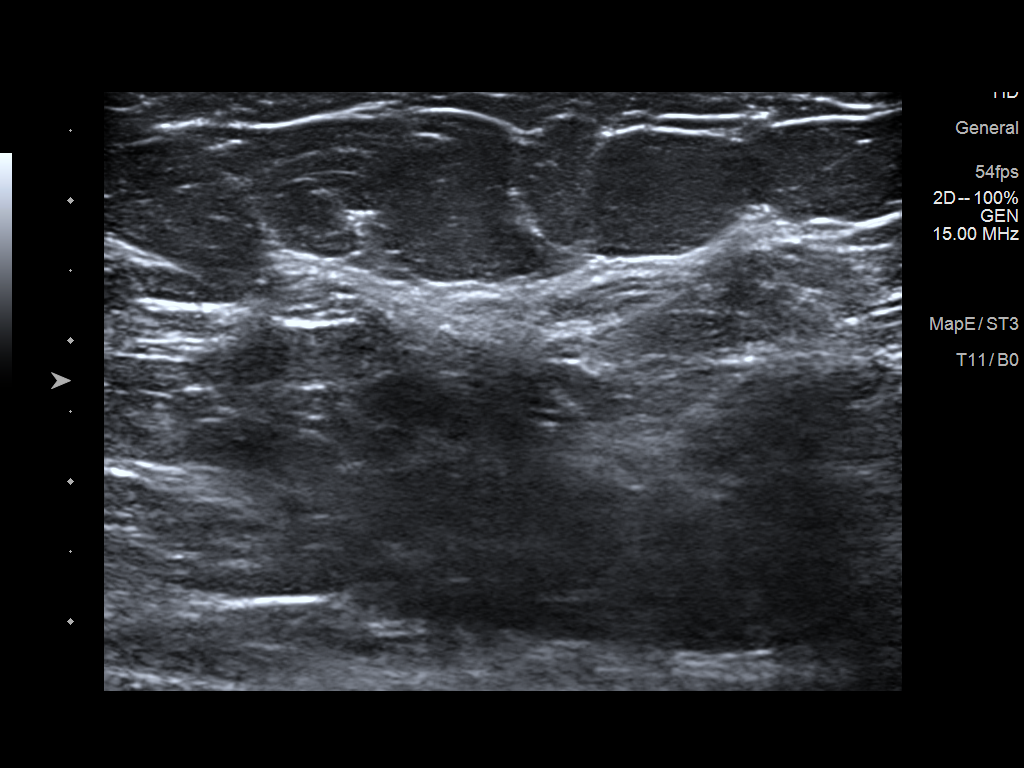
[im 2/3]
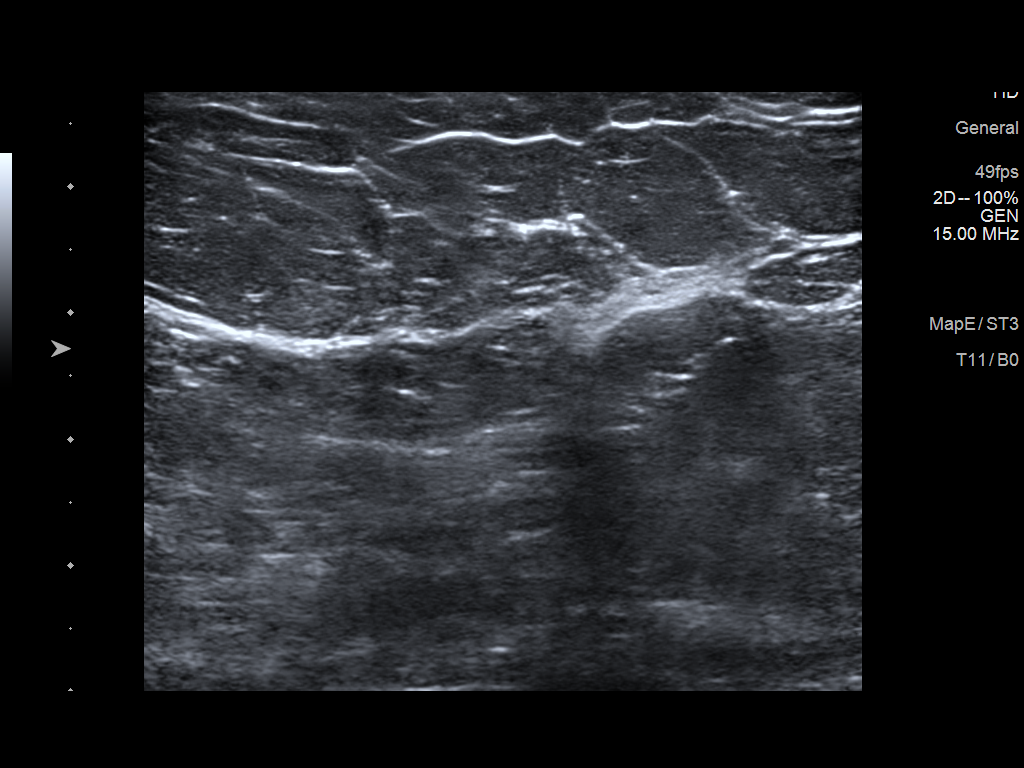
[im 3/3]
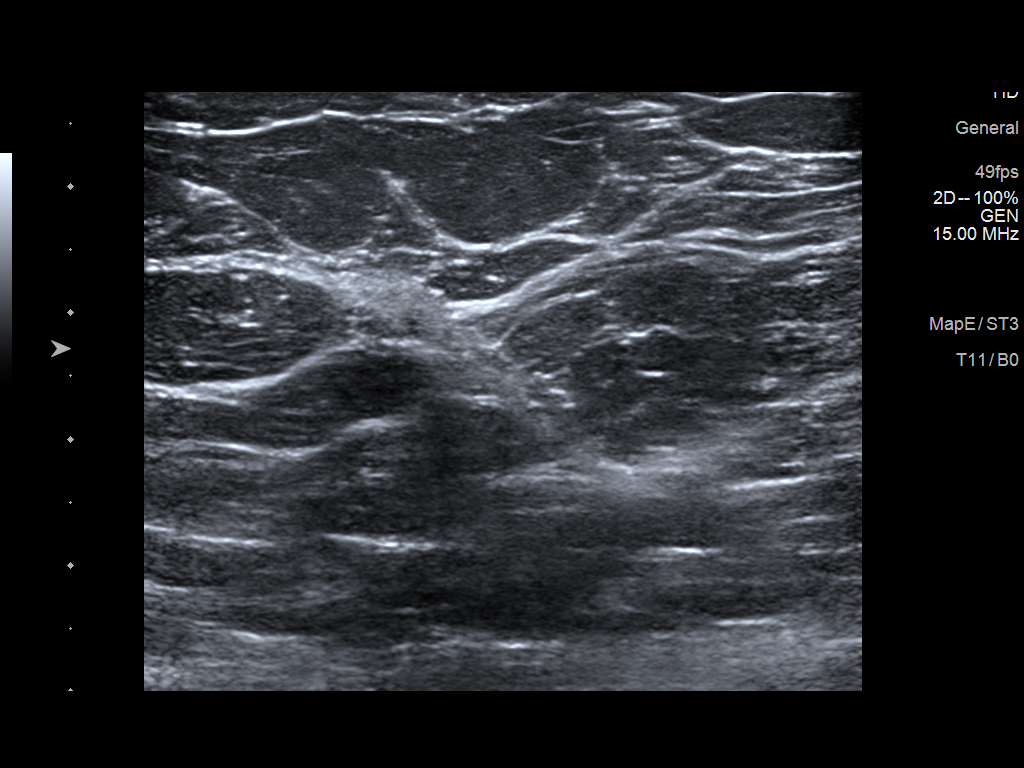

[3 of 3 positions shown; findings below may reference images not displayed]

ACR Breast Density Category b: There are scattered areas of
fibroglandular density.
FINDINGS: An asymmetry in the lateral right breast is seen on the cc
projection effaces on additional views. Precautionary ultrasound was
performed in this area. Otherwise, no new or suspicious mammographic
findings in either breast. The parenchymal pattern is stable.

Targeted ultrasound is performed, showing no focal or suspicious
sonographic abnormalities within the lateral right breast.
IMPRESSION: 1. No mammographic evidence of malignancy in either breast.
2. Unremarkable ultrasound evaluation of the lateral right breast.

RECOMMENDATION:
1. Clinical follow-up recommended for the diffusely painful area of
concern in the left breast. Any further workup should be based on
clinical grounds.
2.  Screening mammogram in one year.(Code:JT-A-0VU)

I have discussed the findings and recommendations with the patient.
If applicable, a reminder letter will be sent to the patient
regarding the next appointment.

BI-RADS CATEGORY  1: Negative.

## 2021-08-01 IMAGING — MG DIGITAL DIAGNOSTIC BILAT W/ TOMO W/ CAD
6 of 12 series · 6 of 36 positions shown · non-contrast
Comparison: Previous exam(s).

CLINICAL DATA: 60-year-old female with diffuse left breast pain.
History of remote treated left breast cancer.

EXAM:
DIGITAL DIAGNOSTIC BILATERAL MAMMOGRAM WITH TOMOSYNTHESIS AND CAD;
ULTRASOUND RIGHT BREAST LIMITED
TECHNIQUE: Bilateral digital diagnostic mammography and breast tomosynthesis
was performed. The images were evaluated with computer-aided
detection.; Targeted ultrasound examination of the right breast was
performed

[R CC synth-2D (1 of 2)]
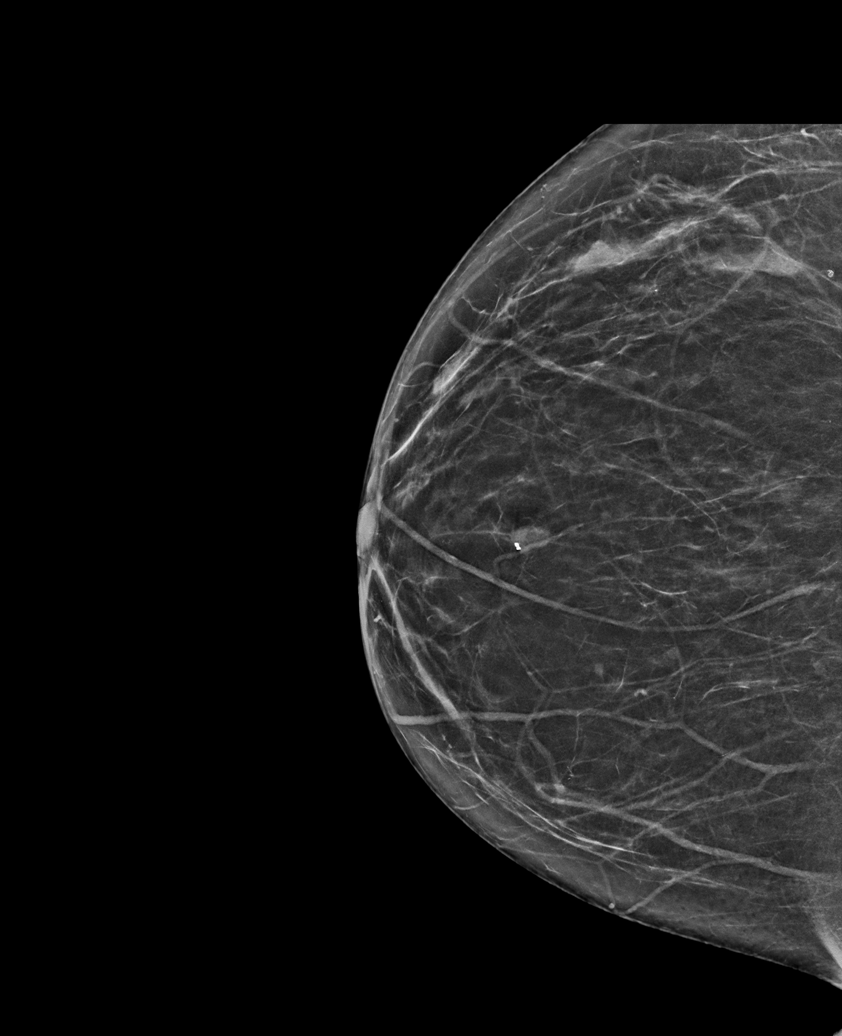

[L CC synth-2D]
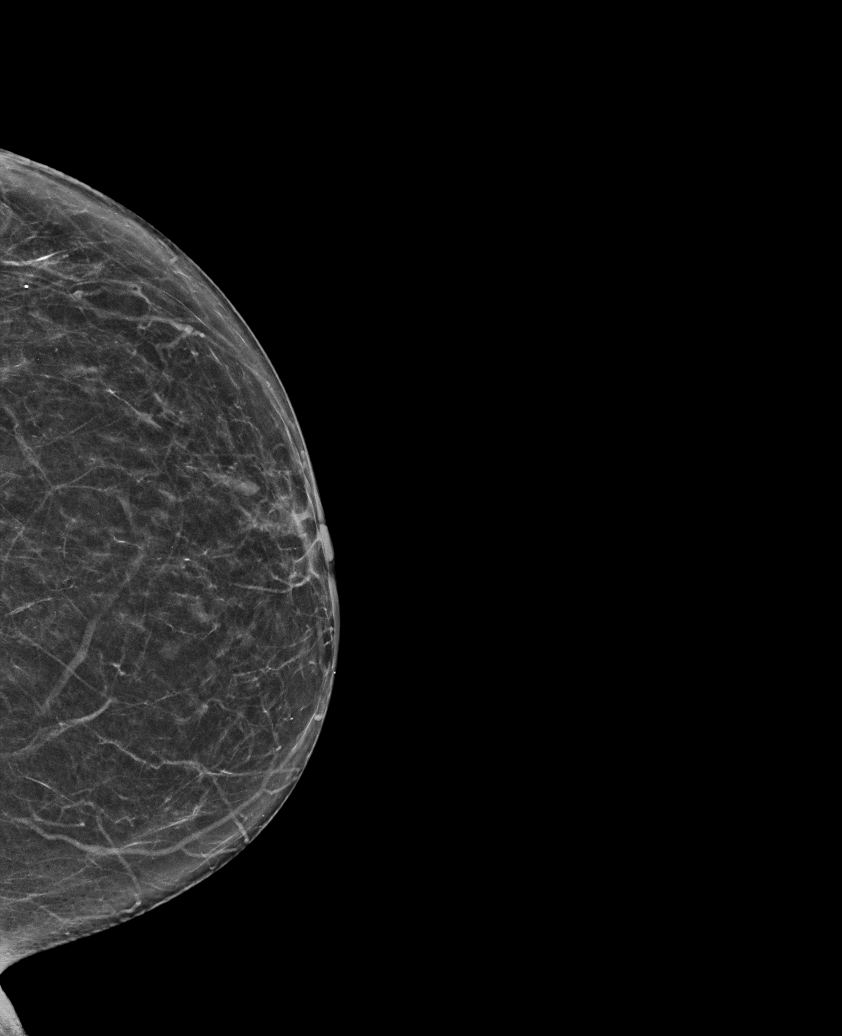

[R MLO synth-2D (1 of 2)]
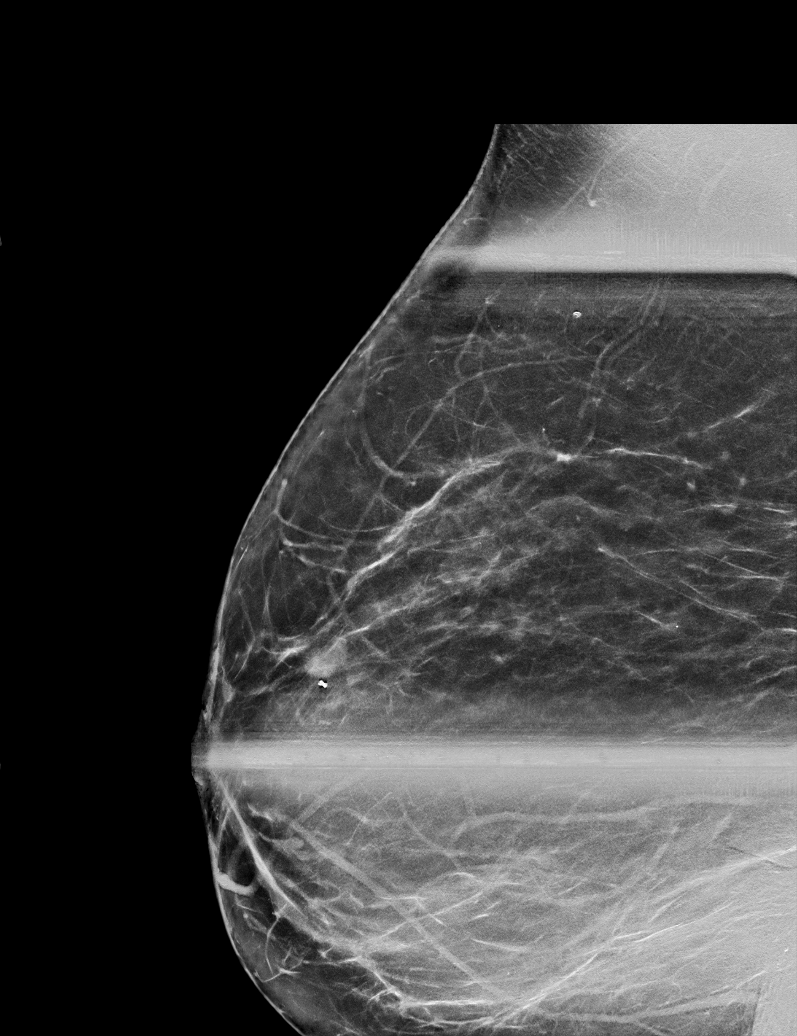

[L MLO synth-2D]
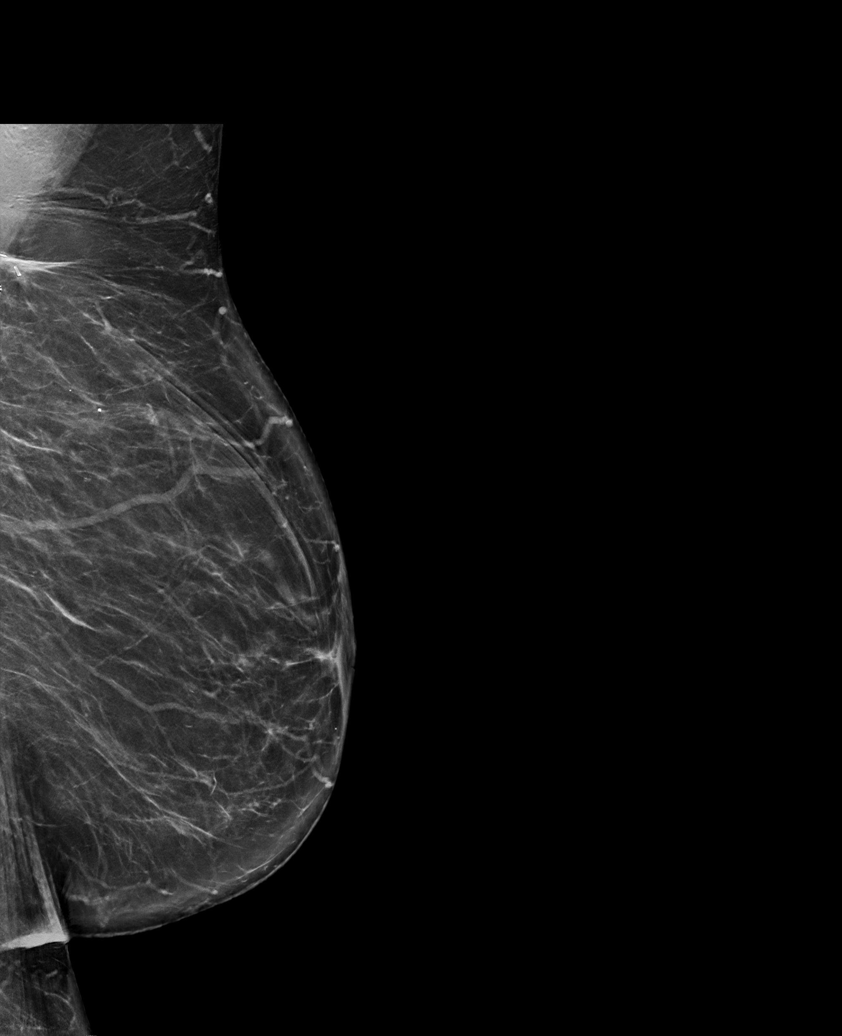

[R MLO synth-2D (2 of 2)]
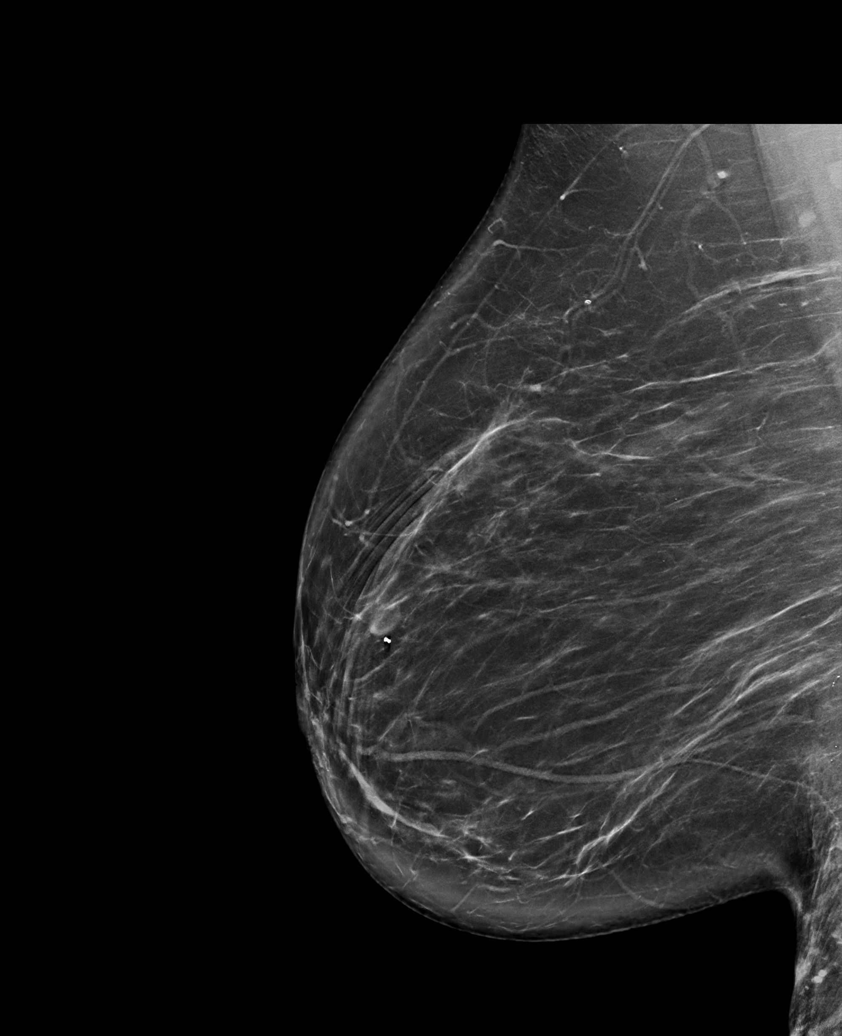

[R CC synth-2D (2 of 2)]
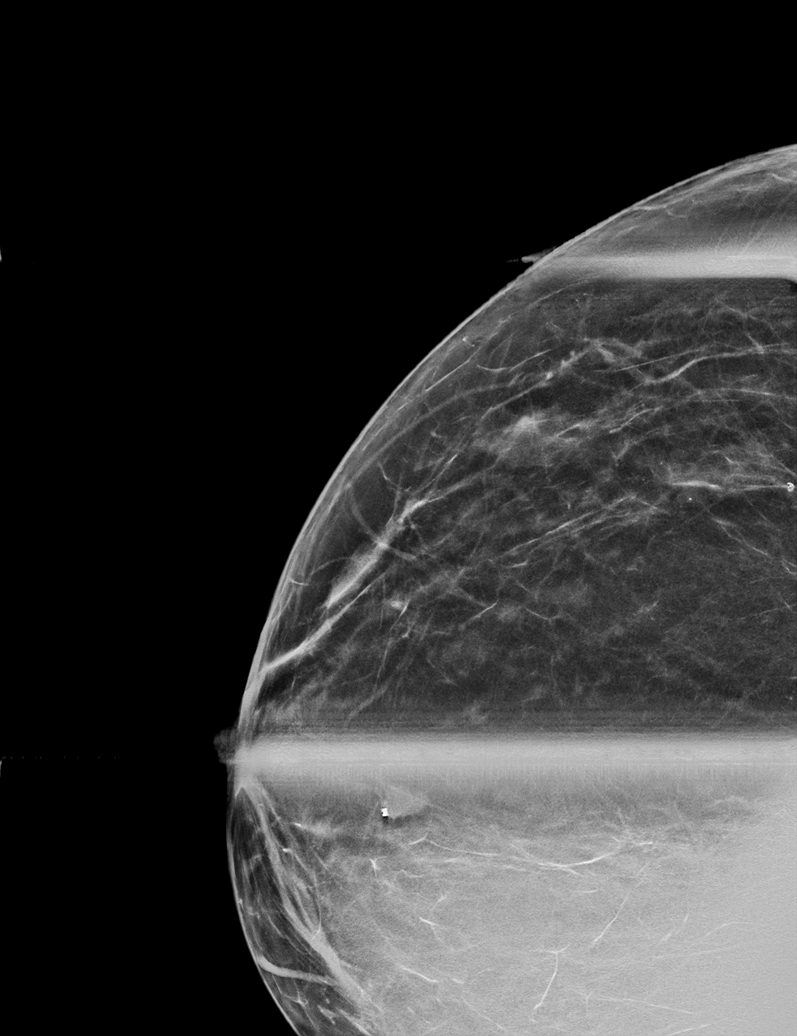

[6 of 36 positions shown; findings below may reference images not displayed]

ACR Breast Density Category b: There are scattered areas of
fibroglandular density.
FINDINGS: An asymmetry in the lateral right breast is seen on the cc
projection effaces on additional views. Precautionary ultrasound was
performed in this area. Otherwise, no new or suspicious mammographic
findings in either breast. The parenchymal pattern is stable.

Targeted ultrasound is performed, showing no focal or suspicious
sonographic abnormalities within the lateral right breast.
IMPRESSION: 1. No mammographic evidence of malignancy in either breast.
2. Unremarkable ultrasound evaluation of the lateral right breast.

RECOMMENDATION:
1. Clinical follow-up recommended for the diffusely painful area of
concern in the left breast. Any further workup should be based on
clinical grounds.
2.  Screening mammogram in one year.(Code:JT-A-0VU)

I have discussed the findings and recommendations with the patient.
If applicable, a reminder letter will be sent to the patient
regarding the next appointment.

BI-RADS CATEGORY  1: Negative.

## 2021-08-01 MED ORDER — TRIAMCINOLONE ACETONIDE 10 MG/ML IJ SUSP
10.0000 mg | Freq: Once | INTRAMUSCULAR | Status: AC
  Administered 2021-08-01: 10 mg

## 2021-08-01 NOTE — Patient Instructions (Signed)

## 2021-08-02 NOTE — Progress Notes (Signed)
Subjective: 61 year old female presents the office today for concerns of left ankle pain.  She said the injection did help for short-term.  She is actually asking for an injection to the same area on the right side.  Otherwise symptoms unchanged.  She states that she is having neuropathy symptoms that are worsening.  She is currently taking tramadol as well as gabapentin.  She is seen by pain management for this.  No other concerns.  No new injuries or concerns.   Objective: AAO x3, NAD DP/PT pulses palpable bilaterally, CRT less than 3 seconds Patient decreased medial arch upon weightbearing bilaterally.  The majority tenderness is localized to the lateral aspect of the left ankle and sinus tarsi and there is localized edema present.  Also discomfort of the right lateral sinus tarsi.  Today on the left side there is tenderness along the course of peroneal, flexor tendons as well as the Achilles tendon.  Clinically the tendons appear to be intact.  MMT 5/5. No pain with calf compression, swelling, warmth, erythema  Assessment: Left ankle capsulitis, flatfoot; right ankle capsulitis; neuropathic pain  Plan: -All treatment options discussed with the patient including all alternatives, risks, complications.  -Reviewed prior x-rays. -Steroid injection performed to the right sinus tarsi.  Skin was prepped with alcohol and 0.5 cc of Kenalog 40, 1 cc Marcaine plain was infiltrated in the sinus tarsi without complications.  Postinjection care discussed.  She tolerated well. -Tri-Lock ankle brace as needed -Continue gabapentin and pain management as prescribed.  Follow-up with Dr. Joneen Roach for further medication changes if needed.  -We will refer back to physical therapy to include other modalities including dry needling, iontopheresis.  Trula Slade DPM

## 2021-08-07 ENCOUNTER — Telehealth: Payer: Self-pay

## 2021-08-07 MED ORDER — GABAPENTIN 300 MG PO CAPS: ORAL_CAPSULE | ORAL | 5 refills | Status: DC

## 2021-08-07 NOTE — Telephone Encounter (Signed)
Gabapentin refill sent to [facility]  ?

## 2021-08-08 DIAGNOSIS — R059 Cough, unspecified: Secondary | ICD-10-CM | POA: Diagnosis not present

## 2021-08-08 DIAGNOSIS — E785 Hyperlipidemia, unspecified: Secondary | ICD-10-CM | POA: Diagnosis not present

## 2021-08-08 DIAGNOSIS — G4700 Insomnia, unspecified: Secondary | ICD-10-CM | POA: Diagnosis not present

## 2021-08-08 DIAGNOSIS — E782 Mixed hyperlipidemia: Secondary | ICD-10-CM | POA: Diagnosis not present

## 2021-08-08 DIAGNOSIS — G2401 Drug induced subacute dyskinesia: Secondary | ICD-10-CM | POA: Diagnosis not present

## 2021-08-08 DIAGNOSIS — K219 Gastro-esophageal reflux disease without esophagitis: Secondary | ICD-10-CM | POA: Diagnosis not present

## 2021-08-08 DIAGNOSIS — I10 Essential (primary) hypertension: Secondary | ICD-10-CM | POA: Diagnosis not present

## 2021-08-08 DIAGNOSIS — J45909 Unspecified asthma, uncomplicated: Secondary | ICD-10-CM | POA: Diagnosis not present

## 2021-08-08 DIAGNOSIS — E1121 Type 2 diabetes mellitus with diabetic nephropathy: Secondary | ICD-10-CM | POA: Diagnosis not present

## 2021-08-08 DIAGNOSIS — E1142 Type 2 diabetes mellitus with diabetic polyneuropathy: Secondary | ICD-10-CM | POA: Diagnosis not present

## 2021-08-09 DIAGNOSIS — R2689 Other abnormalities of gait and mobility: Secondary | ICD-10-CM | POA: Diagnosis not present

## 2021-08-09 DIAGNOSIS — M25571 Pain in right ankle and joints of right foot: Secondary | ICD-10-CM | POA: Diagnosis not present

## 2021-08-09 DIAGNOSIS — R531 Weakness: Secondary | ICD-10-CM | POA: Diagnosis not present

## 2021-08-09 DIAGNOSIS — R2681 Unsteadiness on feet: Secondary | ICD-10-CM | POA: Diagnosis not present

## 2021-08-09 DIAGNOSIS — M25672 Stiffness of left ankle, not elsewhere classified: Secondary | ICD-10-CM | POA: Diagnosis not present

## 2021-08-09 DIAGNOSIS — M25671 Stiffness of right ankle, not elsewhere classified: Secondary | ICD-10-CM | POA: Diagnosis not present

## 2021-08-09 DIAGNOSIS — M25572 Pain in left ankle and joints of left foot: Secondary | ICD-10-CM | POA: Diagnosis not present

## 2021-08-10 DIAGNOSIS — M5416 Radiculopathy, lumbar region: Secondary | ICD-10-CM | POA: Diagnosis not present

## 2021-08-11 DIAGNOSIS — M25672 Stiffness of left ankle, not elsewhere classified: Secondary | ICD-10-CM | POA: Diagnosis not present

## 2021-08-11 DIAGNOSIS — R2681 Unsteadiness on feet: Secondary | ICD-10-CM | POA: Diagnosis not present

## 2021-08-11 DIAGNOSIS — R2689 Other abnormalities of gait and mobility: Secondary | ICD-10-CM | POA: Diagnosis not present

## 2021-08-11 DIAGNOSIS — R531 Weakness: Secondary | ICD-10-CM | POA: Diagnosis not present

## 2021-08-11 DIAGNOSIS — M25571 Pain in right ankle and joints of right foot: Secondary | ICD-10-CM | POA: Diagnosis not present

## 2021-08-11 DIAGNOSIS — M25572 Pain in left ankle and joints of left foot: Secondary | ICD-10-CM | POA: Diagnosis not present

## 2021-08-11 DIAGNOSIS — M25671 Stiffness of right ankle, not elsewhere classified: Secondary | ICD-10-CM | POA: Diagnosis not present

## 2021-08-14 DIAGNOSIS — R2689 Other abnormalities of gait and mobility: Secondary | ICD-10-CM | POA: Diagnosis not present

## 2021-08-14 DIAGNOSIS — M25671 Stiffness of right ankle, not elsewhere classified: Secondary | ICD-10-CM | POA: Diagnosis not present

## 2021-08-14 DIAGNOSIS — M25672 Stiffness of left ankle, not elsewhere classified: Secondary | ICD-10-CM | POA: Diagnosis not present

## 2021-08-14 DIAGNOSIS — M25572 Pain in left ankle and joints of left foot: Secondary | ICD-10-CM | POA: Diagnosis not present

## 2021-08-14 DIAGNOSIS — R531 Weakness: Secondary | ICD-10-CM | POA: Diagnosis not present

## 2021-08-14 DIAGNOSIS — R2681 Unsteadiness on feet: Secondary | ICD-10-CM | POA: Diagnosis not present

## 2021-08-14 DIAGNOSIS — M25571 Pain in right ankle and joints of right foot: Secondary | ICD-10-CM | POA: Diagnosis not present

## 2021-08-15 DIAGNOSIS — M542 Cervicalgia: Secondary | ICD-10-CM | POA: Diagnosis not present

## 2021-08-15 DIAGNOSIS — M25512 Pain in left shoulder: Secondary | ICD-10-CM | POA: Diagnosis not present

## 2021-08-16 ENCOUNTER — Other Ambulatory Visit: Payer: Medicare HMO

## 2021-08-17 ENCOUNTER — Other Ambulatory Visit: Payer: Self-pay

## 2021-08-17 ENCOUNTER — Ambulatory Visit: Payer: Medicare HMO

## 2021-08-17 DIAGNOSIS — M216X1 Other acquired deformities of right foot: Secondary | ICD-10-CM

## 2021-08-17 NOTE — Progress Notes (Addendum)
SITUATION ?Reason for Consult: Evaluation for Prefabricated Diabetic Shoes and Custom Diabetic Inserts. ?Patient / Caregiver Report: Patient would like well fitting shoes ? ?OBJECTIVE DATA: ?Patient History / Diagnosis:  ?  ICD-10-CM   ?1. Type II diabetes mellitus with neurological manifestations ([place])  E11.49   ?  ?2. Acquired equinus deformity of both feet  M21.6X1   ? M21.6X2   ?  ? ? ?Current or Previous Devices:   None and no history ? ?Treating Physician - Dr. Wende Neighbors of [place] ? ?In-Person Foot Examination: ?Ulcers & Callousing:   None and no history ?Deformities:    Hallux Valgus ?Sensation:    Compromised  ?Shoe Size:     9W ? ?ORTHOTIC RECOMMENDATION ?Recommended Devices: ?- 1x pair prefabricated PDAC approved diabetic shoes; Patient Selected [employer] [place] ?- 3x pair custom-to-patient PDAC approved vacuum formed diabetic insoles. ? ?GOALS OF SHOES AND INSOLES ?- Reduce shear and pressure ?- Reduce / Prevent callus formation ?- Reduce / Prevent ulceration ?- Protect the fragile healing compromised diabetic foot. ? ?Patient would benefit from diabetic shoes and inserts as patient has diabetes mellitus and the patient has one or more of the following conditions: ?- Peripheral neuropathy with evidence of callus formation ?- Foot deformity ?- Poor circulation ? ?ACTIONS PERFORMED ?Potential out of pocket cost was communicated to patient. Patient understood and consented to measurement and casting. Patient was casted for insoles via crush box and measured for shoes via brannock device. Procedure was explained and patient tolerated procedure well. All questions were answered and concerns addressed. Casts were shipped to central fabrication for HOLD until Certificate of Medical Necessity or otherwise necessary authorization from insurance is obtained. ? ?PLAN ?Shoes are to be ordered and casts released from hold once all appropriate paperwork is complete. Patient is to be contacted and  scheduled for fitting once shoes and insoles have been fabricated and received. ? ?

## 2021-08-18 ENCOUNTER — Other Ambulatory Visit: Payer: Medicare HMO

## 2021-08-21 DIAGNOSIS — Z96652 Presence of left artificial knee joint: Secondary | ICD-10-CM | POA: Diagnosis not present

## 2021-08-21 DIAGNOSIS — M25552 Pain in left hip: Secondary | ICD-10-CM | POA: Diagnosis not present

## 2021-08-21 DIAGNOSIS — M1611 Unilateral primary osteoarthritis, right hip: Secondary | ICD-10-CM | POA: Diagnosis not present

## 2021-08-21 DIAGNOSIS — Z96651 Presence of right artificial knee joint: Secondary | ICD-10-CM | POA: Diagnosis not present

## 2021-08-21 DIAGNOSIS — M7061 Trochanteric bursitis, right hip: Secondary | ICD-10-CM | POA: Diagnosis not present

## 2021-08-21 DIAGNOSIS — Z6841 Body Mass Index (BMI) 40.0 and over, adult: Secondary | ICD-10-CM | POA: Diagnosis not present

## 2021-08-21 DIAGNOSIS — M25551 Pain in right hip: Secondary | ICD-10-CM | POA: Diagnosis not present

## 2021-08-22 ENCOUNTER — Other Ambulatory Visit: Payer: Self-pay

## 2021-08-22 ENCOUNTER — Encounter: Payer: Medicare HMO | Attending: Registered Nurse | Admitting: Registered Nurse

## 2021-08-22 VITALS — BP 110/76 | HR 87 | Ht 62.0 in | Wt 290.0 lb

## 2021-08-22 DIAGNOSIS — Z79891 Long term (current) use of opiate analgesic: Secondary | ICD-10-CM | POA: Diagnosis not present

## 2021-08-22 DIAGNOSIS — M5416 Radiculopathy, lumbar region: Secondary | ICD-10-CM | POA: Insufficient documentation

## 2021-08-22 DIAGNOSIS — M797 Fibromyalgia: Secondary | ICD-10-CM | POA: Diagnosis not present

## 2021-08-22 DIAGNOSIS — G894 Chronic pain syndrome: Secondary | ICD-10-CM | POA: Diagnosis not present

## 2021-08-22 DIAGNOSIS — Z981 Arthrodesis status: Secondary | ICD-10-CM | POA: Insufficient documentation

## 2021-08-22 DIAGNOSIS — Z5181 Encounter for therapeutic drug level monitoring: Secondary | ICD-10-CM | POA: Diagnosis not present

## 2021-08-22 DIAGNOSIS — M961 Postlaminectomy syndrome, not elsewhere classified: Secondary | ICD-10-CM | POA: Diagnosis not present

## 2021-08-22 DIAGNOSIS — M7061 Trochanteric bursitis, right hip: Secondary | ICD-10-CM | POA: Diagnosis not present

## 2021-08-22 DIAGNOSIS — M1711 Unilateral primary osteoarthritis, right knee: Secondary | ICD-10-CM | POA: Diagnosis not present

## 2021-08-22 DIAGNOSIS — E1121 Type 2 diabetes mellitus with diabetic nephropathy: Secondary | ICD-10-CM | POA: Diagnosis not present

## 2021-08-22 DIAGNOSIS — E785 Hyperlipidemia, unspecified: Secondary | ICD-10-CM | POA: Diagnosis not present

## 2021-08-22 DIAGNOSIS — M7062 Trochanteric bursitis, left hip: Secondary | ICD-10-CM | POA: Insufficient documentation

## 2021-08-22 DIAGNOSIS — M47817 Spondylosis without myelopathy or radiculopathy, lumbosacral region: Secondary | ICD-10-CM | POA: Insufficient documentation

## 2021-08-22 NOTE — Patient Instructions (Signed)
Increase Tramadol to two tablets ( 100 mg) three times a day as needed for pain.  ? ?Please call office on Friday with a Update:  ?[phone]  ?

## 2021-08-22 NOTE — Progress Notes (Signed)
? ?Subjective:  ? ? Patient ID: Kelsey Wilson, female    DOB: February 25, 1961, 61 y.o.   MRN: [id number] ? ?HPI: Kelsey Wilson is a 61 y.o. female who returns for follow up appointment for chronic pain and medication refill. She states her pain is located in her lower back radiating into her buttocks and bilateral hips R>L and right knee pain. Kelsey Wilson reports increase intensity of lower back pain and bilateral hip pain. She is scheduled for hip injection with her orthopedist she reports. Kelsey Wilson reports her current medication regimen is not controlling her pain , Tramadol increased to two tablets three times a day, Kelsey Wilson was instructed to call office on Friday with an update medication change, she verbalizes understanding. Due to her increase intensity of pain, we will order a Rollator Walker and a Shower bench to aid in her mobility and safety precautions. She verbalizes understanding.  ?She rates her pain 9. Her current exercise regime is walking and performing stretching exercises. ? ?Kelsey Wilson Morphine equivalent is 20.00 MME. She is also prescribed Alprazolam  by Selina Cooley  .We have discussed the black box warning of using opioids and benzodiazepines. I highlighted the dangers of using these drugs together and discussed the adverse events including respiratory suppression, overdose, cognitive impairment and importance of compliance with current regimen. We will continue to monitor and adjust as indicated.  ? ?UDS ordered today.  ?  ? ?Pain Inventory ?Average Pain 8 ?Pain Right Now 9 ?My pain is aching ? ?In the last 24 hours, has pain interfered with the following? ?General activity 9 ?Relation with others 9 ?Enjoyment of life 9 ?What TIME of day is your pain at its worst? morning , daytime, evening, and night ?Sleep (in general) Fair ? ?Pain is worse with: walking, bending, sitting, inactivity, and standing ?Pain improves with: rest and heat/ice ?Relief from Meds: 0 ? ?Family History  ?Problem  Relation Age of Onset  ? Hypertension Mother   ? Diabetes Mother   ? Hypertension Father   ? Diabetes Father   ? Cancer Paternal Grandmother   ?     unknown  ? Breast cancer Paternal Aunt   ? Hypertension Sister   ? Hyperlipidemia Brother   ? Colon cancer Neg Hx   ? ?Social History  ? ?Socioeconomic History  ? Marital status: Single  ?  Spouse name: Not on file  ? Number of children: 3  ? Years of education: Not on file  ? Highest education level: Not on file  ?Occupational History  ? Not on file  ?Tobacco Use  ? Smoking status: Never  ? Smokeless tobacco: Never  ?Vaping Use  ? Vaping Use: Never used  ?Substance and Sexual Activity  ? Alcohol use: No  ?  Alcohol/week: 0.0 standard drinks  ? Drug use: No  ? Sexual activity: Never  ?  Birth control/protection: Post-menopausal, Surgical  ?Other Topics Concern  ? Not on file  ?Social History Narrative  ? Not on file  ? ?Social Determinants of Health  ? ?Financial Resource Strain: Not on file  ?Food Insecurity: Not on file  ?Transportation Needs: Not on file  ?Physical Activity: Not on file  ?Stress: Not on file  ?Social Connections: Not on file  ? ?Past Surgical History:  ?Procedure Laterality Date  ? ABDOMINAL HYSTERECTOMY    ? still has ovaries  ? APPENDECTOMY    ? AXILLARY LYMPH NODE DISSECTION  11/28/2011  ? Procedure: AXILLARY LYMPH NODE DISSECTION;  Surgeon: Edward Jolly, MD;  Location: [place];  Service: General;  Laterality: Left;  ? BREAST LUMPECTOMY Left 11/28/2011  ? Malignant  ? BREAST SURGERY Left 2013  ? CARPAL TUNNEL RELEASE    ? Bilateral  ? CESAREAN SECTION    ? pt. has had 3  ? CHOLECYSTECTOMY    ? COLONOSCOPY    ? COLONOSCOPY WITH PROPOFOL N/A 01/24/2021  ? Procedure: COLONOSCOPY WITH PROPOFOL;  Surgeon: Doran Stabler, MD;  Location: [facility];  Service: Gastroenterology;  Laterality: N/A;  ? ESOPHAGOGASTRODUODENOSCOPY (EGD) WITH PROPOFOL N/A 01/24/2021  ? Procedure: ESOPHAGOGASTRODUODENOSCOPY (EGD) WITH PROPOFOL;  Surgeon: Doran Stabler, MD;  Location: [facility];  Service: Gastroenterology;  Laterality: N/A;  ? EYE SURGERY Bilateral   ? cataract removal  ? KNEE SURGERY    ? Left Knee  ? LAMINECTOMY WITH POSTERIOR LATERAL ARTHRODESIS LEVEL 1 N/A 11/29/2017  ? Procedure: Posterior Lateral Fusion - Lumbar Four-Lumbar Five, removal and replacement of hardware, Laminectomy - Lumbar Four-Lumbar Five;  Surgeon: Eustace Moore, MD;  Location: [facility];  Service: Neurosurgery;  Laterality: N/A;  ? LAMINECTOMY WITH POSTERIOR LATERAL ARTHRODESIS LEVEL 2 N/A 06/07/2017  ? Procedure: Posterior Lateral Fusion Lumbar Three-Four and Transforaminal Interbody Fusion Lumbar Four-Five with Segmental  Pedicle Screw Fixation;  Surgeon: Eustace Moore, MD;  Location: [place];  Service: Neurosurgery;  Laterality: N/A;  Posterior Lateral Fusion Lumbar Three-Four and Transforaminal Interbody Fusion Lumbar Four-Five with Segmental  Pedicle Screw Fixation   ? LUMBAR FUSION  06/07/2017  ? POST  ? LUMBAR LAMINECTOMY/DECOMPRESSION MICRODISCECTOMY Bilateral 01/27/2016  ? Procedure: Laminectomy and Foraminotomy - Lumbar four -Lumbar five - bilateral- on-lay noninstrumented fusion;  Surgeon: Eustace Moore, MD;  Location: [place] [facility];  Service: Neurosurgery;  Laterality: Bilateral;  ? MULTIPLE EXTRACTIONS WITH ALVEOLOPLASTY N/A 05/11/2016  ? Procedure: EXTRACTION OF TEETH EIGHTEEN, TWENTY AND TWENTY- NINE;  REMOVAL OF MANDIBULAR TORUS AND EXOSTOSIS;  Surgeon: Diona Browner, DDS;  Location: [place];  Service: Oral Surgery;  Laterality: N/A;  ? POLYPECTOMY  01/24/2021  ? Procedure: POLYPECTOMY;  Surgeon: Doran Stabler, MD;  Location: [facility];  Service: Gastroenterology;;  ? PORTACATH PLACEMENT  06/18/2011  ? Procedure: INSERTION PORT-A-CATH;  Surgeon: Edward Jolly, MD;  Location: [place];  Service: General;  Laterality: Right;  right subclavian  ? removal portacath  2014  ? spur    ? Apex spur on both big toes  ? TOE SURGERY Bilateral   ? TONSILLECTOMY    ?  TOTAL KNEE ARTHROPLASTY Left 09/13/2014  ? TOTAL KNEE ARTHROPLASTY Left 09/13/2014  ? Procedure: LEFT TOTAL KNEE ARTHROPLASTY;  Surgeon: Rod Can, MD;  Location: [place];  Service: Orthopedics;  Laterality: Left;  ? TOTAL KNEE ARTHROPLASTY Right 03/10/2015  ? Procedure: RIGHT TOTAL KNEE ARTHROPLASTY;  Surgeon: Rod Can, MD;  Location: [facility];  Service: Orthopedics;  Laterality: Right;  ? ?Past Surgical History:  ?Procedure Laterality Date  ? ABDOMINAL HYSTERECTOMY    ? still has ovaries  ? APPENDECTOMY    ? AXILLARY LYMPH NODE DISSECTION  11/28/2011  ? Procedure: AXILLARY LYMPH NODE DISSECTION;  Surgeon: Edward Jolly, MD;  Location: [place];  Service: General;  Laterality: Left;  ? BREAST LUMPECTOMY Left 11/28/2011  ? Malignant  ? BREAST SURGERY Left 2013  ? CARPAL TUNNEL RELEASE    ? Bilateral  ? CESAREAN SECTION    ? pt. has had 3  ? CHOLECYSTECTOMY    ?  COLONOSCOPY    ? COLONOSCOPY WITH PROPOFOL N/A 01/24/2021  ? Procedure: COLONOSCOPY WITH PROPOFOL;  Surgeon: Doran Stabler, MD;  [facility];  Service: Gastroenterology;  Laterality: N/A;  ? ESOPHAGOGASTRODUODENOSCOPY (EGD) WITH PROPOFOL N/A 01/24/2021  ? Procedure: ESOPHAGOGASTRODUODENOSCOPY (EGD) WITH PROPOFOL;  Surgeon: Doran Stabler, MD;  [facility];  Service: Gastroenterology;  Laterality: N/A;  ? EYE SURGERY Bilateral   ? cataract removal  ? KNEE SURGERY    ? Left Knee  ? LAMINECTOMY WITH POSTERIOR LATERAL ARTHRODESIS LEVEL 1 N/A 11/29/2017  ? Procedure: Posterior Lateral Fusion - Lumbar Four-Lumbar Five, removal and replacement of hardware, Laminectomy - Lumbar Four-Lumbar Five;  Surgeon: Eustace Moore, MD;  Location: [facility];  Service: Neurosurgery;  Laterality: N/A;  ? LAMINECTOMY WITH POSTERIOR LATERAL ARTHRODESIS LEVEL 2 N/A 06/07/2017  ? Procedure: Posterior Lateral Fusion Lumbar Three-Four and Transforaminal Interbody Fusion Lumbar Four-Five with Segmental  Pedicle Screw Fixation;  Surgeon: Eustace Moore, MD;   Location: [place];  Service: Neurosurgery;  Laterality: N/A;  Posterior Lateral Fusion Lumbar Three-Four and Transforaminal Interbody Fusion Lumbar Four-Five with Segmental  Pedicle Screw Fixation   ? LUMBA

## 2021-08-25 ENCOUNTER — Telehealth: Payer: Self-pay | Admitting: *Deleted

## 2021-08-25 ENCOUNTER — Encounter: Payer: Self-pay | Admitting: Registered Nurse

## 2021-08-25 MED ORDER — TRAMADOL HCL 50 MG PO TABS: ORAL_TABLET | ORAL | 5 refills | Status: DC

## 2021-08-25 NOTE — Telephone Encounter (Signed)
[place] called to let Zella Ball know that the Tramadol is working well.  ?

## 2021-08-27 DIAGNOSIS — M5416 Radiculopathy, lumbar region: Secondary | ICD-10-CM | POA: Diagnosis not present

## 2021-08-27 DIAGNOSIS — M179 Osteoarthritis of knee, unspecified: Secondary | ICD-10-CM | POA: Diagnosis not present

## 2021-08-29 DIAGNOSIS — R2681 Unsteadiness on feet: Secondary | ICD-10-CM | POA: Diagnosis not present

## 2021-08-29 DIAGNOSIS — M25572 Pain in left ankle and joints of left foot: Secondary | ICD-10-CM | POA: Diagnosis not present

## 2021-08-29 DIAGNOSIS — R531 Weakness: Secondary | ICD-10-CM | POA: Diagnosis not present

## 2021-08-29 DIAGNOSIS — M25571 Pain in right ankle and joints of right foot: Secondary | ICD-10-CM | POA: Diagnosis not present

## 2021-08-29 DIAGNOSIS — M25671 Stiffness of right ankle, not elsewhere classified: Secondary | ICD-10-CM | POA: Diagnosis not present

## 2021-08-29 DIAGNOSIS — M25672 Stiffness of left ankle, not elsewhere classified: Secondary | ICD-10-CM | POA: Diagnosis not present

## 2021-08-29 DIAGNOSIS — R2689 Other abnormalities of gait and mobility: Secondary | ICD-10-CM | POA: Diagnosis not present

## 2021-08-29 LAB — TOXASSURE SELECT,+ANTIDEPR,UR

## 2021-08-30 ENCOUNTER — Telehealth: Payer: Self-pay | Admitting: *Deleted

## 2021-08-30 NOTE — Telephone Encounter (Signed)
Urine drug screen for this encounter is consistent for prescribed medication 

## 2021-09-05 NOTE — Therapy (Signed)
?OUTPATIENT PHYSICAL THERAPY THORACOLUMBAR EVALUATION ? ? ?Patient Name: Kelsey Wilson ?MRN: [id number] ?DOB:11-11-1960, 61 y.o., female ?Today's Date: 09/06/2021 ? ? PT End of Session - 09/06/21 1057   ? ? Visit Number 1   ? Number of Visits 6   ? Date for PT Re-Evaluation 10/18/21   ? Authorization Type [employer] MCR   ? Progress Note Due on Visit 6   ? PT Start Time 1000   ? PT Stop Time 1045   ? PT Time Calculation (min) 45 min   ? Activity Tolerance Patient tolerated treatment well   ? Behavior During Therapy [facility] for tasks assessed/performed   ? ?  ?  ? ?  ? ? ?Past Medical History:  ?Diagnosis Date  ? Anemia   ? Anxiety   ? Arthritis   ? Breast cancer ([place]) 2010  ? T3N1 invasive ductal carcinoma left breast.Takes Arimidex daily  ? Bursitis   ? Carpal tunnel syndrome   ? Chronic back pain   ? stenosis  ? Constipation   ? takes Colace daily  ? Depression   ? takes Benzotropine daily  ? Diverticulitis of colon   ? Dyspnea   ? daily when walking for over 1 yr.  ? Fibromyalgia 08/2012  ? GERD (gastroesophageal reflux disease)   ? takes Dexilant daily  ? Hemorrhoid   ? History of blood transfusion   ? no abnormal reaction noted  ? History of colon polyps   ? benign  ? History of shingles   ? Joint pain   ? Joint swelling   ? Morbid obesity ([place])   ? Night muscle spasms   ? takes Flexeril nightly as needed  ? Nocturia   ? OSA (obstructive sleep apnea)   ? OSA on CPAP   ? Peripheral edema   ? takes Furosemide.Just started 01/18/16  ? Peripheral neuropathy   ? takes Lyrica daily  ? Personal history of chemotherapy   ? 2013  ? Personal history of radiation therapy   ? 2013  ? Pneumonia   ? hx of-2015  ? Pseudoarthrosis of lumbar spine   ? Seasonal allergies   ? takes Singulair nightly  ? SOB (shortness of breath) on exertion   ? rarely with exertion  ? Splenorenal shunt malfunction ([place])   ? 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 Alphonzo Grieve)  ? Type  II diabetes mellitus ([place])   ? ?Past Surgical History:  ?Procedure Laterality Date  ? ABDOMINAL HYSTERECTOMY    ? still has ovaries  ? APPENDECTOMY    ? AXILLARY LYMPH NODE DISSECTION  11/28/2011  ? Procedure: AXILLARY LYMPH NODE DISSECTION;  Surgeon: Edward Jolly, MD;  Location: [place];  Service: General;  Laterality: Left;  ? BREAST LUMPECTOMY Left 11/28/2011  ? Malignant  ? BREAST SURGERY Left 2013  ? CARPAL TUNNEL RELEASE    ? Bilateral  ? CESAREAN SECTION    ? pt. has had 3  ? CHOLECYSTECTOMY    ? COLONOSCOPY    ? COLONOSCOPY WITH PROPOFOL N/A 01/24/2021  ? Procedure: COLONOSCOPY WITH PROPOFOL;  Surgeon: Doran Stabler, MD;  [facility];  Service: Gastroenterology;  Laterality: N/A;  ? ESOPHAGOGASTRODUODENOSCOPY (EGD) WITH PROPOFOL N/A 01/24/2021  ? Procedure: ESOPHAGOGASTRODUODENOSCOPY (EGD) WITH PROPOFOL;  Surgeon: Doran Stabler, MD;  [facility];  Service: Gastroenterology;  Laterality: N/A;  ? EYE SURGERY Bilateral   ? cataract removal  ?  KNEE SURGERY    ? Left Knee  ? LAMINECTOMY WITH POSTERIOR LATERAL ARTHRODESIS LEVEL 1 N/A 11/29/2017  ? Procedure: Posterior Lateral Fusion - Lumbar Four-Lumbar Five, removal and replacement of hardware, Laminectomy - Lumbar Four-Lumbar Five;  Surgeon: Eustace Moore, MD;  Location: [facility];  Service: Neurosurgery;  Laterality: N/A;  ? LAMINECTOMY WITH POSTERIOR LATERAL ARTHRODESIS LEVEL 2 N/A 06/07/2017  ? Procedure: Posterior Lateral Fusion Lumbar Three-Four and Transforaminal Interbody Fusion Lumbar Four-Five with Segmental  Pedicle Screw Fixation;  Surgeon: Eustace Moore, MD;  Location: [place];  Service: Neurosurgery;  Laterality: N/A;  Posterior Lateral Fusion Lumbar Three-Four and Transforaminal Interbody Fusion Lumbar Four-Five with Segmental  Pedicle Screw Fixation   ? LUMBAR FUSION  06/07/2017  ? POST  ? LUMBAR LAMINECTOMY/DECOMPRESSION MICRODISCECTOMY Bilateral 01/27/2016  ? Procedure: Laminectomy and Foraminotomy - Lumbar four -Lumbar  five - bilateral- on-lay noninstrumented fusion;  Surgeon: Eustace Moore, MD;  Location: [place] [facility];  Service: Neurosurgery;  Laterality: Bilateral;  ? MULTIPLE EXTRACTIONS WITH ALVEOLOPLASTY N/A 05/11/2016  ? Procedure: EXTRACTION OF TEETH EIGHTEEN, TWENTY AND TWENTY- NINE;  REMOVAL OF MANDIBULAR TORUS AND EXOSTOSIS;  Surgeon: Diona Browner, DDS;  Location: [place];  Service: Oral Surgery;  Laterality: N/A;  ? POLYPECTOMY  01/24/2021  ? Procedure: POLYPECTOMY;  Surgeon: Doran Stabler, MD;  Location: [facility];  Service: Gastroenterology;;  ? PORTACATH PLACEMENT  06/18/2011  ? Procedure: INSERTION PORT-A-CATH;  Surgeon: Edward Jolly, MD;  Location: [place];  Service: General;  Laterality: Right;  right subclavian  ? removal portacath  2014  ? spur    ? Apex spur on both big toes  ? TOE SURGERY Bilateral   ? TONSILLECTOMY    ? TOTAL KNEE ARTHROPLASTY Left 09/13/2014  ? TOTAL KNEE ARTHROPLASTY Left 09/13/2014  ? Procedure: LEFT TOTAL KNEE ARTHROPLASTY;  Surgeon: Rod Can, MD;  Location: [place];  Service: Orthopedics;  Laterality: Left;  ? TOTAL KNEE ARTHROPLASTY Right 03/10/2015  ? Procedure: RIGHT TOTAL KNEE ARTHROPLASTY;  Surgeon: Rod Can, MD;  Location: [facility];  Service: Orthopedics;  Laterality: Right;  ? ?Patient Active Problem List  ? Diagnosis Date Noted  ? Morbid obesity ([place])   ? Type II diabetes mellitus ([place])   ? GERD (gastroesophageal reflux disease) 06/21/2019  ? Insomnia 03/03/2019  ? Ankle arthritis 12/12/2018  ? Muscle weakness 12/12/2018  ? Obstructive sleep apnea 10/23/2018  ? Asthmatic bronchitis 10/23/2018  ? Cellulitis of internal cheek, left 01/23/2018  ? Lung nodule 09/15/2017  ? Morbid obesity with BMI of 45.0-49.9, adult ([place]) 09/05/2017  ? Arthritis of carpometacarpal (CMC) joints of both thumbs 03/14/2017  ? 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 Vertigo 10/12/2016  ? Peripheral positional vertigo 08/28/2016  ?  Abnormal auditory perception of both ears 07/27/2016  ? Neuropathic pain 06/25/2016  ? 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 S/P lumbar spinal fusion 01/27/2016  ? H/O therapeutic radiation 09/21/2015  ? Personal history of breast cancer 09/21/2015  ? Pes planus of both feet 08/04/2015  ? Primary osteoarthritis of right knee 03/10/2015  ? Osteoarthritis of right knee 02/04/2015  ? Primary osteoarthritis of left knee 09/13/2014  ? Hot flashes 01/19/2014  ? Abdominal pain, unspecified site 01/19/2014  ? Malignant neoplasm of upper-outer quadrant of left breast in female, estrogen receptor positive ([place]) 03/19/2013  ? Lumbosacral spondylosis without myelopathy 12/30/2012  ? Fibromyalgia syndrome 08/15/2012  ? Peripheral neuropathy, toxic 08/15/2012  ? Lymphedema of arm -  left 04/30/2012  ? ? ?PCP: Vonna Drafts, FNP ? ?REFERRING PROVIDER: Vonna Drafts, FNP ? ?REFERRING DIAG: M54.16 (ICD-10-CM) - Radiculopathy, lumbar region  ? ?THERAPY DIAG: lumbosacral pain ? ? ?ONSET DATE: Chronic in nature ? ?SUBJECTIVE:                                                                                                                                                                                          ? ?SUBJECTIVE STATEMENT: ?Reports a history of chronic low back pain which extends to her feet at times, pain worsened with driving, walking, bending and housework, made better with heat, lying down and resting  ?PERTINENT HISTORY:  ?1. Chronic Sacroiliac Joint Pain: S/P Bilateral Sacroilliac Injection with Dr. Letta Pate on 06/23/2019. With good relief noted. 08/22/2021. ?2.. Lumbar Postlaminectomy/ S/P Lumbar Fusion:Spinal Stenosis:S/P Posterior Lateral Fusion L-4-L-5 removal and replacement of hardware, Laminectomy L4-L5 by Dr. Ronnald Ramp on 05/22/2018. Continue HEP as Tolerated and Continue current medication regimen. 08/22/2021   ? ?PAIN:  ?Are you having pain? Yes: 10/10 ? ? ?PRECAUTIONS: Back ? ?WEIGHT BEARING RESTRICTIONS  No ? ?FALLS:  ?Has patient fallen in last 6 months? No ? ?LIVING ENVIRONMENT: ?Lives with: lives with their family ?Lives in: House/apartment ?Stairs: 2-3 to enter home ? ?OCCUPATION: disabled ? ?PLOF: In

## 2021-09-06 ENCOUNTER — Ambulatory Visit: Payer: Medicare HMO | Attending: Nurse Practitioner

## 2021-09-06 DIAGNOSIS — M545 Low back pain, unspecified: Secondary | ICD-10-CM | POA: Diagnosis not present

## 2021-09-06 DIAGNOSIS — M6281 Muscle weakness (generalized): Secondary | ICD-10-CM | POA: Diagnosis not present

## 2021-09-11 ENCOUNTER — Ambulatory Visit: Payer: Medicare HMO

## 2021-09-11 DIAGNOSIS — M6281 Muscle weakness (generalized): Secondary | ICD-10-CM

## 2021-09-11 DIAGNOSIS — M545 Low back pain, unspecified: Secondary | ICD-10-CM | POA: Diagnosis not present

## 2021-09-11 NOTE — Therapy (Signed)
?OUTPATIENT PHYSICAL THERAPY TREATMENT NOTE ? ? ?Patient Name: Kelsey Wilson ?MRN: [id number] ?DOB:[birth year], 61 y.o., female ?Today's Date: 09/11/2021 ? ?PCP: Vonna Drafts, FNP ?REFERRING PROVIDER: Vonna Drafts, FNP ? ?END OF SESSION:  ? PT End of Session - 09/11/21 0915   ? ? Visit Number 2   ? Number of Visits 6   ? Date for PT Re-Evaluation 10/18/21   ? Authorization Type [employer] MCR   ? Progress Note Due on Visit 6   ? PT Start Time 4496   ? PT Stop Time 1000   ? PT Time Calculation (min) 40 min   ? Activity Tolerance Patient tolerated treatment well   ? Behavior During Therapy [facility] for tasks assessed/performed   ? ?  ?  ? ?  ? ? ?Past Medical History:  ?Diagnosis Date  ? Anemia   ? Anxiety   ? Arthritis   ? Breast cancer ([place]) 2010  ? T3N1 invasive ductal carcinoma left breast.Takes Arimidex daily  ? Bursitis   ? Carpal tunnel syndrome   ? Chronic back pain   ? stenosis  ? Constipation   ? takes Colace daily  ? Depression   ? takes Benzotropine daily  ? Diverticulitis of colon   ? Dyspnea   ? daily when walking for over 1 yr.  ? Fibromyalgia 08/2012  ? GERD (gastroesophageal reflux disease)   ? takes Dexilant daily  ? Hemorrhoid   ? History of blood transfusion   ? no abnormal reaction noted  ? History of colon polyps   ? benign  ? History of shingles   ? Joint pain   ? Joint swelling   ? Morbid obesity ([place])   ? Night muscle spasms   ? takes Flexeril nightly as needed  ? Nocturia   ? OSA (obstructive sleep apnea)   ? OSA on CPAP   ? Peripheral edema   ? takes Furosemide.Just started 01/18/16  ? Peripheral neuropathy   ? takes Lyrica daily  ? Personal history of chemotherapy   ? 2013  ? Personal history of radiation therapy   ? 2013  ? Pneumonia   ? hx of-2015  ? Pseudoarthrosis of lumbar spine   ? Seasonal allergies   ? takes Singulair nightly  ? SOB (shortness of breath) on exertion   ? rarely with exertion  ? Splenorenal shunt malfunction ([place])   ? 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 Alphonzo Grieve)  ? Type II diabetes mellitus ([place])   ? ?Past Surgical History:  ?Procedure Laterality Date  ? ABDOMINAL HYSTERECTOMY    ? still has ovaries  ? APPENDECTOMY    ? AXILLARY LYMPH NODE DISSECTION  11/28/2011  ? Procedure: AXILLARY LYMPH NODE DISSECTION;  Surgeon: Edward Jolly, MD;  Location: [facility];  Service: General;  Laterality: Left;  ? BREAST LUMPECTOMY Left 11/28/2011  ? Malignant  ? BREAST SURGERY Left 2013  ? CARPAL TUNNEL RELEASE    ? Bilateral  ? CESAREAN SECTION    ? pt. has had 3  ? CHOLECYSTECTOMY    ? COLONOSCOPY    ? COLONOSCOPY WITH PROPOFOL N/A 01/24/2021  ? Procedure: COLONOSCOPY WITH PROPOFOL;  Surgeon: Doran Stabler, MD;  Location: [facility];  Service: Gastroenterology;  Laterality: N/A;  ? ESOPHAGOGASTRODUODENOSCOPY (EGD) WITH PROPOFOL N/A 01/24/2021  ? Procedure: ESOPHAGOGASTRODUODENOSCOPY (EGD) WITH PROPOFOL;  Surgeon: Doran Stabler, MD;  Location: [facility];  Service: Gastroenterology;  Laterality: N/A;  ? EYE SURGERY Bilateral   ? cataract removal  ? KNEE SURGERY    ? Left Knee  ? LAMINECTOMY WITH POSTERIOR LATERAL ARTHRODESIS LEVEL 1 N/A 11/29/2017  ? Procedure: Posterior Lateral Fusion - Lumbar Four-Lumbar Five, removal and replacement of hardware, Laminectomy - Lumbar Four-Lumbar Five;  Surgeon: Eustace [place], MD;  Location[facility];  Service: Neurosurgery;  Laterality: N/A;  ? LAMINECTOMY WITH POSTERIOR LATERAL ARTHRODESIS LEVEL 2 N/A 06/07/2017  ? Procedure: Posterior Lateral Fusion Lumbar Three-Four and Transforaminal Interbody Fusion Lumbar Four-Five with Segmental  Pedicle Screw Fixation;  Surgeon: Eustace [place], MD;  Location: [place];  Service: Neurosurgery;  Laterality: N/A;  Posterior Lateral Fusion Lumbar Three-Four and Transforaminal Interbody Fusion Lumbar Four-Five with Segmental  Pedicle Screw Fixation   ? LUMBAR FUSION  06/07/2017  ? POST  ? LUMBAR LAMINECTOMY/DECOMPRESSION  MICRODISCECTOMY Bilateral 01/27/2016  ? Procedure: Laminectomy and Foraminotomy - Lumbar four -Lumbar five - bilateral- on-lay noninstrumented fusion;  Surgeon: Eustace [place], MD;  Location: [place] [facility];  Service: Neurosurgery;  Laterality: Bilateral;  ? MULTIPLE EXTRACTIONS WITH ALVEOLOPLASTY N/A 05/11/2016  ? Procedure: EXTRACTION OF TEETH EIGHTEEN, TWENTY AND TWENTY- NINE;  REMOVAL OF MANDIBULAR TORUS AND EXOSTOSIS;  Surgeon: Diona Browner, DDS;  Location: [place];  Service: Oral Surgery;  Laterality: N/A;  ? POLYPECTOMY  01/24/2021  ? Procedure: POLYPECTOMY;  Surgeon: Doran Stabler, MD;  Location: [facility];  Service: Gastroenterology;;  ? PORTACATH PLACEMENT  06/18/2011  ? Procedure: INSERTION PORT-A-CATH;  Surgeon: Edward Jolly, MD;  Location: [place];  Service: General;  Laterality: Right;  right subclavian  ? removal portacath  2014  ? spur    ? Apex spur on both big toes  ? TOE SURGERY Bilateral   ? TONSILLECTOMY    ? TOTAL KNEE ARTHROPLASTY Left 09/13/2014  ? TOTAL KNEE ARTHROPLASTY Left 09/13/2014  ? Procedure: LEFT TOTAL KNEE ARTHROPLASTY;  Surgeon: Rod Can, MD;  Location: [place];  Service: Orthopedics;  Laterality: Left;  ? TOTAL KNEE ARTHROPLASTY Right 03/10/2015  ? Procedure: RIGHT TOTAL KNEE ARTHROPLASTY;  Surgeon: Rod Can, MD;  Location: [facility];  Service: Orthopedics;  Laterality: Right;  ? ?Patient Active Problem List  ? Diagnosis Date Noted  ? Morbid obesity ([place])   ? Type II diabetes mellitus ([place])   ? GERD (gastroesophageal reflux disease) 06/21/2019  ? Insomnia 03/03/2019  ? Ankle arthritis 12/12/2018  ? Muscle weakness 12/12/2018  ? Obstructive sleep apnea 10/23/2018  ? Asthmatic bronchitis 10/23/2018  ? Cellulitis of internal cheek, left 01/23/2018  ? Lung nodule 09/15/2017  ? Morbid obesity with BMI of 45.0-49.9, adult ([place]) 09/05/2017  ? Arthritis of carpometacarpal (CMC) joints of both thumbs 03/14/2017  ? Spinal stenosis, lumbar region, with  neurogenic claudication 03/07/2017  ? Vertigo 10/12/2016  ? Peripheral positional vertigo 08/28/2016  ? Abnormal auditory perception of both ears 07/27/2016  ? Neuropathic pain 06/25/2016  ? 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 S/P lumbar spinal fusion 01/27/2016  ? H/O therapeutic radiation 09/21/2015  ? Personal history of breast cancer 09/21/2015  ? Pes planus of both feet 08/04/2015  ? Primary osteoarthritis of right knee 03/10/2015  ? Osteoarthritis of right knee 02/04/2015  ? Primary osteoarthritis of left knee 09/13/2014  ? Hot flashes 01/19/2014  ? Abdominal pain, unspecified site 01/19/2014  ? Malignant neoplasm of upper-outer quadrant of left breast in female, estrogen receptor positive ([place]) 03/19/2013  ? Lumbosacral spondylosis without myelopathy  12/30/2012  ? Fibromyalgia syndrome 08/15/2012  ? Peripheral neuropathy, toxic 08/15/2012  ? Lymphedema of arm - left 04/30/2012  ? ? ?REFERRING DIAG: M54.16 (ICD-10-CM) - Radiculopathy, lumbar region  ? ?THERAPY DIAG:  ?Muscle weakness (generalized) ? ?Lumbosacral pain ? ?PERTINENT HISTORY: 1. Chronic Sacroiliac Joint Pain: S/P Bilateral Sacroilliac Injection with Dr. Letta Pate on 06/23/2019. With good relief noted. 08/22/2021. ?2.. Lumbar Postlaminectomy/ S/P Lumbar Fusion:Spinal Stenosis:S/P Posterior Lateral Fusion L-4-L-5 removal and replacement of hardware, Laminectomy L4-L5 by Dr. Ronnald Ramp on 05/22/2018. Continue HEP as Tolerated and Continue current medication regimen. 08/22/2021   ? ?PRECAUTIONS: Back ? ?SUBJECTIVE: No change in symptoms, has been working on her HEP w/o any increase in discomfort ? ?PAIN:  ?Are you having pain? Yes: NPRS scale: 8/10 in central low back, no radicular symptoms ? ? ? ? ?  ?OBJECTIVE:  ?  ?DIAGNOSTIC FINDINGS:  ?None current ?  ?PATIENT SURVEYS:  ?FOTO 30 ?  ?SCREENING FOR RED FLAGS: ?Bowel or bladder incontinence: No ?  ?COGNITION: ?          Overall cognitive status: Within functional limits for tasks assessed                ?           ?SENSATION: ?WFL ?  ?MUSCLE LENGTH: ?Hamstrings: Right 40 deg; Left 40 deg ?Thomas test: UTA due to poor tolerance of supine position ?  ?POSTURE:  ?R lateral lean, forward flexed at hips with lumbar extension

## 2021-09-13 DIAGNOSIS — M25671 Stiffness of right ankle, not elsewhere classified: Secondary | ICD-10-CM | POA: Diagnosis not present

## 2021-09-13 DIAGNOSIS — M25672 Stiffness of left ankle, not elsewhere classified: Secondary | ICD-10-CM | POA: Diagnosis not present

## 2021-09-13 DIAGNOSIS — R2681 Unsteadiness on feet: Secondary | ICD-10-CM | POA: Diagnosis not present

## 2021-09-13 DIAGNOSIS — R2689 Other abnormalities of gait and mobility: Secondary | ICD-10-CM | POA: Diagnosis not present

## 2021-09-13 DIAGNOSIS — M25571 Pain in right ankle and joints of right foot: Secondary | ICD-10-CM | POA: Diagnosis not present

## 2021-09-13 DIAGNOSIS — M25572 Pain in left ankle and joints of left foot: Secondary | ICD-10-CM | POA: Diagnosis not present

## 2021-09-13 DIAGNOSIS — R531 Weakness: Secondary | ICD-10-CM | POA: Diagnosis not present

## 2021-09-14 ENCOUNTER — Encounter: Payer: Self-pay | Admitting: Sports Medicine

## 2021-09-14 ENCOUNTER — Ambulatory Visit (INDEPENDENT_AMBULATORY_CARE_PROVIDER_SITE_OTHER): Payer: Medicare HMO | Admitting: Sports Medicine

## 2021-09-14 DIAGNOSIS — M79675 Pain in left toe(s): Secondary | ICD-10-CM

## 2021-09-14 DIAGNOSIS — B351 Tinea unguium: Secondary | ICD-10-CM

## 2021-09-14 DIAGNOSIS — M25571 Pain in right ankle and joints of right foot: Secondary | ICD-10-CM

## 2021-09-14 DIAGNOSIS — M79674 Pain in right toe(s): Secondary | ICD-10-CM

## 2021-09-14 DIAGNOSIS — G8929 Other chronic pain: Secondary | ICD-10-CM

## 2021-09-14 DIAGNOSIS — M25572 Pain in left ankle and joints of left foot: Secondary | ICD-10-CM

## 2021-09-14 DIAGNOSIS — E1149 Type 2 diabetes mellitus with other diabetic neurological complication: Secondary | ICD-10-CM

## 2021-09-14 NOTE — Progress Notes (Signed)
Subjective: ?Kelsey Wilson is a 61 y.o. female patient with history of diabetes who presents to office today complaining of long,mildly painful nails  while ambulating in shoes; unable to trim. Patient states that she is in physical therapy for ankle pain and is not getting better but denies any other pedal complaints at this time.   ? ?Last PCP visit 08/11/2021 ? ?Fasting blood sugar not recorded by patient. ? ?A1c unknown by patient. ?Patient Active Problem List  ? Diagnosis Date Noted  ? Morbid obesity ([place])   ? Type II diabetes mellitus ([place])   ? GERD (gastroesophageal reflux disease) 06/21/2019  ? Insomnia 03/03/2019  ? Ankle arthritis 12/12/2018  ? Muscle weakness 12/12/2018  ? Obstructive sleep apnea 10/23/2018  ? Asthmatic bronchitis 10/23/2018  ? Cellulitis of internal cheek, left 01/23/2018  ? Lung nodule 09/15/2017  ? Morbid obesity with BMI of 45.0-49.9, adult ([place]) 09/05/2017  ? Arthritis of carpometacarpal (CMC) joints of both thumbs 03/14/2017  ? 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 Vertigo 10/12/2016  ? Peripheral positional vertigo 08/28/2016  ? Abnormal auditory perception of both ears 07/27/2016  ? Neuropathic pain 06/25/2016  ? 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 S/P lumbar spinal fusion 01/27/2016  ? H/O therapeutic radiation 09/21/2015  ? Personal history of breast cancer 09/21/2015  ? Pes planus of both feet 08/04/2015  ? Primary osteoarthritis of right knee 03/10/2015  ? Osteoarthritis of right knee 02/04/2015  ? Primary osteoarthritis of left knee 09/13/2014  ? Hot flashes 01/19/2014  ? Abdominal pain, unspecified site 01/19/2014  ? Malignant neoplasm of upper-outer quadrant of left breast in female, estrogen receptor positive ([place]) 03/19/2013  ? Lumbosacral spondylosis without myelopathy 12/30/2012  ? Fibromyalgia syndrome 08/15/2012  ? Peripheral neuropathy, toxic 08/15/2012  ? Lymphedema of arm - left 04/30/2012  ? ?Current Outpatient Medications  on File Prior to Visit  ?Medication Sig Dispense Refill  ? ACCU-CHEK AVIVA PLUS test strip TEST WHILE FASTING AND 2 HOURS AFTER SUPPER    ? Accu-Chek Softclix Lancets lancets TEST AS DIRECTED AND 2 HOURS AFTER SUPPER    ? ALPRAZolam (XANAX) 0.5 MG tablet Take 0.5 mg by mouth at bedtime.    ? ALPRAZolam (XANAX) 1 MG tablet alprazolam 1 mg tablet    ? B-D ULTRAFINE III SHORT PEN 31G X 8 MM MISC USE ONE NEEDLE D    ? benzonatate (TESSALON) 100 MG capsule Take 2 capsules (200 mg total) by mouth 3 (three) times daily as needed for cough. (Patient not taking: Reported on 01/18/2021) 40 capsule 12  ? clindamycin (CLEOCIN) 300 MG capsule Take 600 mg by mouth once as needed.    ? cyclobenzaprine (FLEXERIL) 5 MG tablet TAKE 1 TABLET BY MOUTH TWICE A DAY AS NEEDED FOR MUSCLE SPASMS 60 tablet 3  ? DEXILANT 30 MG capsule Take 30 mg by mouth daily.    ? dicyclomine (BENTYL) 10 MG capsule Take 1 capsule (10 mg total) by mouth 3 (three) times daily as needed for spasms (abdominal pain). 30 capsule 0  ? Dulaglutide (TRULICITY) 1.5 XT/0.6YI SOPN Trulicity 1.5 RS/8.5 mL subcutaneous pen injector    ? DULoxetine (CYMBALTA) 60 MG capsule Take 60 mg by mouth daily.     ? furosemide (LASIX) 20 MG tablet Take 20 mg by mouth daily.    ? gabapentin (NEURONTIN) 300 MG capsule TAKE 1 CAPSULE BY MOUTH THREE TIMES A DAY 90 capsule 5  ? glipiZIDE (GLUCOTROL) 10 MG tablet Take 10 mg  by mouth daily.    ? INGREZZA 80 MG capsule Take 80 mg by mouth daily.    ? levocetirizine (XYZAL) 5 MG tablet Take 5 mg by mouth daily.    ? lisinopril (ZESTRIL) 5 MG tablet Take 5 mg by mouth daily.    ? metoprolol succinate (TOPROL-XL) 25 MG 24 hr tablet Take 12.5 mg by mouth daily.    ? montelukast (SINGULAIR) 10 MG tablet Take 10 mg by mouth at bedtime.    ? naloxone (NARCAN) nasal spray 4 mg/0.1 mL Place 1 spray into the nose daily as needed (Overdose).    ? ondansetron (ZOFRAN-ODT) 4 MG disintegrating tablet Dissolve one tablet on the tongue every 8 hours as  needed for nausea and vomiting. 30 tablet 0  ? predniSONE (STERAPRED UNI-PAK 21 TAB) 10 MG (21) TBPK tablet Take as directed 21 tablet 0  ? rosuvastatin (CRESTOR) 10 MG tablet Take 10 mg by mouth at bedtime.    ? SUPREP BOWEL PREP KIT 17.5-3.13-1.6 GM/177ML SOLN Take 1 kit by mouth as directed. For colonoscopy prep 354 mL 0  ? temazepam (RESTORIL) 15 MG capsule Take 1 capsule (15 mg total) by mouth at bedtime as needed for sleep. 30 capsule 5  ? traMADol (ULTRAM) 50 MG tablet Take 1- 2  tablets three times a day as needed for pain. 180 tablet 5  ? TRULICITY 2.58 NI/7.7OE SOPN Inject 0.75 mg into the skin once a week.    ? TRULICITY 1.5 UM/3.5TI SOPN Inject into the skin.    ? umeclidinium-vilanterol (ANORO ELLIPTA) 62.5-25 MCG/INH AEPB Inhale 1 puff into the lungs daily. 1 each 12  ? Valbenazine Tosylate 40 MG CAPS Take 40 mg by mouth daily. [place]    ? ?Current Facility-Administered Medications on File Prior to Visit  ?Medication Dose Route Frequency Provider Last Rate Last Admin  ? triamcinolone acetonide (KENALOG) 10 MG/ML injection 10 mg  10 mg Other Once Landis Martins, DPM      ? ?Allergies  ?Allergen Reactions  ? Aleve [Naproxen] Nausea Only  ? Compazine [Prochlorperazine] Other (See Comments)  ?  Numbness of face and  lips ?  ? Hydrocodone Itching  ?  High dose only  ? Oxycodone Other (See Comments)  ?  hallucinations ?  ? Penicillins Nausea Only and Other (See Comments)  ?  Has patient had a PCN reaction causing immediate rash, facial/tongue/throat swelling, SOB or lightheadedness with hypotension: No ?Has patient had a PCN reaction causing severe rash involving mucus membranes or skin necrosis: No ?Has patient had a PCN reaction that required hospitalization No ?Has patient had a PCN reaction occurring within the last 10 years: No ?If all of the above answers are "NO", then may proceed with Cephalosporin use.  ? ? ?Recent Results (from the past 2160 hour(s))  ?[facility]ox[facility] Select Plus     Status: None  ?  Collection Time: 08/22/21 11:00 AM  ?Result Value Ref Range  ? Summary Note   ?  Comment: ==================================================================== ?[facility]ox[facility] Select,+Antidepr,UR ?==================================================================== ?Test                             Result       Flag       Units ? ?Drug Present ?  Alprazolam                     45  ng/mg creat ?  Alpha-hydroxyalprazolam        275                     ng/mg creat ?   Source of alprazolam is a scheduled prescription medication. Alpha- ?   hydroxyalprazolam is an expected metabolite of alprazolam. ? ?  Tramadol                       >6579                   ng/mg creat ?  O-Desmethyltramadol            >6579                   ng/mg creat ?  N-Desmethyltramadol            >6579                   ng/mg creat ?   Source of tramadol is a prescription medication. O-desmethyltramadol ?   and N-desmethyltramadol are expected metabolites of tramadol. ? ?  Duloxetine                     PRESENT ?==================================================================== ?Test                       Result    Flag   Units      Ref Range ?  Creatinine              76               mg/dL      >=20 ?==================================================================== ?Declared Medications: ? Medication list was not provided. ?==================================================================== ?For clinical consultation, please call [phone]. ?==================================================================== ?  ? ? ? ?Objective: ?General: Patient is awake, alert, and oriented x 3 and in no acute distress. ? ?Integument: Skin is warm, dry and supple bilateral. Nails are tender, long, thickened and dystrophic with subungual debris, consistent with onychomycosis, 1-5 bilateral. No signs of infection. Old scar on left foot. No open lesions or preulcerative lesions present bilateral. Remaining integument  unremarkable. ? ?Vasculature:  Dorsalis Pedis pulse 1/4 bilateral. Posterior Tibial pulse  1/4 bilateral. Capillary fill time <3 sec 1-5 bilateral. Positive hair growth to the level of the digits. Temperature gradient withi

## 2021-09-20 DIAGNOSIS — M25551 Pain in right hip: Secondary | ICD-10-CM | POA: Diagnosis not present

## 2021-09-22 ENCOUNTER — Ambulatory Visit: Payer: Medicare HMO

## 2021-09-23 ENCOUNTER — Encounter: Payer: Self-pay | Admitting: Emergency Medicine

## 2021-09-23 ENCOUNTER — Ambulatory Visit
Admission: EM | Admit: 2021-09-23 | Discharge: 2021-09-23 | Disposition: A | Discharge status: Home / Self Care | Payer: Medicare HMO | Attending: Urgent Care | Admitting: Urgent Care

## 2021-09-23 DIAGNOSIS — G894 Chronic pain syndrome: Secondary | ICD-10-CM

## 2021-09-23 DIAGNOSIS — M7062 Trochanteric bursitis, left hip: Secondary | ICD-10-CM

## 2021-09-23 DIAGNOSIS — M7061 Trochanteric bursitis, right hip: Secondary | ICD-10-CM

## 2021-09-23 MED ORDER — KETOROLAC TROMETHAMINE 60 MG/2ML IM SOLN
60.0000 mg | Freq: Once | INTRAMUSCULAR | Status: AC
  Administered 2021-09-23: 60 mg via INTRAMUSCULAR

## 2021-09-23 NOTE — ED Provider Notes (Signed)
?[facility] ? ? ? ?CSN: [id number] ?Arrival date & time: 09/23/21  1444 ? ? ?  ? ?History   ?Chief Complaint ?Chief Complaint  ?Patient presents with  ? Hip Pain  ? ? ?HPI ?Kelsey Wilson is a 61 y.o. female.  ? ?Pleasant 61 year old female presents today for concerns of left hip pain.  She has a known history of bilateral greater trochanteric bursitis, she also has fibromyalgia and acute pain syndrome.  She states on the 19th she was at her orthopedist and received a steroid hip injection.  She states this helped immensely with the pain.  She feels however she is having a flare now on the left side.  She has been taking tramadol and gabapentin as prescribed.  She is hoping to receive an injection today as she cannot take p.o. NSAIDs due to nausea.  She denies any acute trauma.  She states this feels like her normal flareups.  She denies bowel or bladder incontinence or saddle anesthesia. ? ? ?Hip Pain ? ? ?Past Medical History:  ?Diagnosis Date  ? Anemia   ? Anxiety   ? Arthritis   ? Breast cancer ([place]) 2010  ? T3N1 invasive ductal carcinoma left breast.Takes Arimidex daily  ? Bursitis   ? Carpal tunnel syndrome   ? Chronic back pain   ? stenosis  ? Constipation   ? takes Colace daily  ? Depression   ? takes Benzotropine daily  ? Diverticulitis of colon   ? Dyspnea   ? daily when walking for over 1 yr.  ? Fibromyalgia 08/2012  ? GERD (gastroesophageal reflux disease)   ? takes Dexilant daily  ? Hemorrhoid   ? History of blood transfusion   ? no abnormal reaction noted  ? History of colon polyps   ? benign  ? History of shingles   ? Joint pain   ? Joint swelling   ? Morbid obesity ([place])   ? Night muscle spasms   ? takes Flexeril nightly as needed  ? Nocturia   ? OSA (obstructive sleep apnea)   ? OSA on CPAP   ? Peripheral edema   ? takes Furosemide.Just started 01/18/16  ? Peripheral neuropathy   ? takes Lyrica daily  ? Personal history of chemotherapy   ? 2013  ? Personal history of radiation therapy    ? 2013  ? Pneumonia   ? hx of-2015  ? Pseudoarthrosis of lumbar spine   ? Seasonal allergies   ? takes Singulair nightly  ? SOB (shortness of breath) on exertion   ? rarely with exertion  ? Splenorenal shunt malfunction ([place])   ? 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 Alphonzo Grieve)  ? Type II diabetes mellitus ([place])   ? ? ?Patient Active Problem List  ? Diagnosis Date Noted  ? Morbid obesity ([place])   ? Type II diabetes mellitus ([place])   ? GERD (gastroesophageal reflux disease) 06/21/2019  ? Insomnia 03/03/2019  ? Ankle arthritis 12/12/2018  ? Muscle weakness 12/12/2018  ? Obstructive sleep apnea 10/23/2018  ? Asthmatic bronchitis 10/23/2018  ? Cellulitis of internal cheek, left 01/23/2018  ? Lung nodule 09/15/2017  ? Morbid obesity with BMI of 45.0-49.9, adult ([place]) 09/05/2017  ? Arthritis of carpometacarpal (CMC) joints of both thumbs 03/14/2017  ? 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 Vertigo 10/12/2016  ? Peripheral positional vertigo 08/28/2016  ? Abnormal auditory perception of both  ears 07/27/2016  ? Neuropathic pain 06/25/2016  ? 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 S/P lumbar spinal fusion 01/27/2016  ? H/O therapeutic radiation 09/21/2015  ? Personal history of breast cancer 09/21/2015  ? Pes planus of both feet 08/04/2015  ? Primary osteoarthritis of right knee 03/10/2015  ? Osteoarthritis of right knee 02/04/2015  ? Primary osteoarthritis of left knee 09/13/2014  ? Hot flashes 01/19/2014  ? Abdominal pain, unspecified site 01/19/2014  ? Malignant neoplasm of upper-outer quadrant of left breast in female, estrogen receptor positive ([place]) 03/19/2013  ? Lumbosacral spondylosis without myelopathy 12/30/2012  ? Fibromyalgia syndrome 08/15/2012  ? Peripheral neuropathy, toxic 08/15/2012  ? Lymphedema of arm - left 04/30/2012  ? ? ?Past Surgical History:  ?Procedure Laterality Date  ? ABDOMINAL  HYSTERECTOMY    ? still has ovaries  ? APPENDECTOMY    ? AXILLARY LYMPH NODE DISSECTION  11/28/2011  ? Procedure: AXILLARY LYMPH NODE DISSECTION;  Surgeon: Edward Jolly, MD;  Location: [facility];  Service: General;  Laterality: Left;  ? BREAST LUMPECTOMY Left 11/28/2011  ? Malignant  ? BREAST SURGERY Left 2013  ? CARPAL TUNNEL RELEASE    ? Bilateral  ? CESAREAN SECTION    ? pt. has had 3  ? CHOLECYSTECTOMY    ? COLONOSCOPY    ? COLONOSCOPY WITH PROPOFOL N/A 01/24/2021  ? Procedure: COLONOSCOPY WITH PROPOFOL;  Surgeon: Doran Stabler, MD;  Location: [facility];  Service: Gastroenterology;  Laterality: N/A;  ? ESOPHAGOGASTRODUODENOSCOPY (EGD) WITH PROPOFOL N/A 01/24/2021  ? Procedure: ESOPHAGOGASTRODUODENOSCOPY (EGD) WITH PROPOFOL;  Surgeon: Doran Stabler, MD;  Location: [facility];  Service: Gastroenterology;  Laterality: N/A;  ? EYE SURGERY Bilateral   ? cataract removal  ? KNEE SURGERY    ? Left Knee  ? LAMINECTOMY WITH POSTERIOR LATERAL ARTHRODESIS LEVEL 1 N/A 11/29/2017  ? Procedure: Posterior Lateral Fusion - Lumbar Four-Lumbar Five, removal and replacement of hardware, Laminectomy - Lumbar Four-Lumbar Five;  Surgeon: Eustace Moore, MD;  Location: [facility] OR;  Service: Neurosurgery;  Laterality: N/A;  ? LAMINECTOMY WITH POSTERIOR LATERAL ARTHRODESIS LEVEL 2 N/A 06/07/2017  ? Procedure: Posterior Lateral Fusion Lumbar Three-Four and Transforaminal Interbody Fusion Lumbar Four-Five with Segmental  Pedicle Screw Fixation;  Surgeon: Eustace Moore, MD;  Location: [facility];  Service: Neurosurgery;  Laterality: N/A;  Posterior Lateral Fusion Lumbar Three-Four and Transforaminal Interbody Fusion Lumbar Four-Five with Segmental  Pedicle Screw Fixation   ? LUMBAR FUSION  06/07/2017  ? POST  ? LUMBAR LAMINECTOMY/DECOMPRESSION MICRODISCECTOMY Bilateral 01/27/2016  ? Procedure: Laminectomy and Foraminotomy - Lumbar four -Lumbar five - bilateral- on-lay noninstrumented fusion;  Surgeon: Eustace Moore, MD;  Location: [facility];  Service: Neurosurgery;  Laterality: Bilateral;  ? MULTIPLE EXTRACTIONS WITH ALVEOLOPLASTY N/A 05/11/2016  ? Procedure: EXTRACTION OF TEETH EIGHTEEN, TWENTY AND TWENTY- NINE;  REMOVAL OF MANDIBULAR TORUS AND EXOSTOSIS;  Surgeon: Diona Browner, DDS;  Location: [facility];  Service: Oral Surgery;  Laterality: N/A;  ? POLYPECTOMY  01/24/2021  ? Procedure: POLYPECTOMY;  Surgeon: Doran Stabler, MD;  Location: [facility];  Service: Gastroenterology;;  ? PORTACATH PLACEMENT  06/18/2011  ? Procedure: INSERTION PORT-A-CATH;  Surgeon: Edward Jolly, MD;  Location: [facility];  Service: General;  Laterality: Right;  right subclavian  ? removal portacath  2014  ? spur    ? Apex spur on both big toes  ? TOE SURGERY Bilateral   ? TONSILLECTOMY    ? TOTAL  KNEE ARTHROPLASTY Left 09/13/2014  ? TOTAL KNEE ARTHROPLASTY Left 09/13/2014  ? Procedure: LEFT TOTAL KNEE ARTHROPLASTY;  Surgeon: Rod Can, MD;  Location: [facility];  Service: Orthopedics;  Laterality: Left;  ? TOTAL KNEE ARTHROPLASTY Right 03/10/2015  ? Procedure: RIGHT TOTAL KNEE ARTHROPLASTY;  Surgeon: Rod Can, MD;  Location: [facility];  Service: Orthopedics;  Laterality: Right;  ? ? ?OB History   ?No obstetric history on file. ?  ? ? ? ?Home Medications   ? ?Prior to Admission medications   ?Medication Sig Start Date End Date Taking? Authorizing Provider  ?ACCU-CHEK AVIVA PLUS test strip TEST WHILE FASTING AND 2 HOURS AFTER SUPPER 10/01/18  Yes [provider]  ?Accu-Chek Softclix Lancets lancets TEST AS DIRECTED AND 2 HOURS AFTER SUPPER 12/04/18  Yes [provider]  ?ALPRAZolam (XANAX) 0.5 MG tablet Take 0.5 mg by mouth at bedtime. 07/31/20  Yes [provider]  ?ALPRAZolam Duanne Moron) 1 MG tablet alprazolam 1 mg tablet   Yes [provider]  ?B-D ULTRAFINE III SHORT PEN 31G X 8 MM MISC USE ONE NEEDLE D 12/03/18  Yes [provider]  ?benzonatate (TESSALON) 100 MG capsule Take 2 capsules (200 mg total) by  mouth 3 (three) times daily as needed for cough. 06/01/19  Yes Young, Tarri Fuller D, MD  ?clindamycin (CLEOCIN) 300 MG capsule Take 600 mg by mouth once as needed. 01/09/21  Yes [provider]  ?cyclobenzap

## 2021-09-23 NOTE — ED Triage Notes (Signed)
Patient c/o left hip pain, no injury, pt has had issues in the past, worse today.  Patient has taken Gabapentin and Tramadol today @ 2pm. ?

## 2021-09-23 NOTE — Discharge Instructions (Addendum)
You were given an injection called ketorolac in our office.  This is an anti-inflammatory similar to ibuprofen or Aleve.  Do not take any additional over-the-counter NSAIDs today.  ?Please follow-up with Ortho next week to consider a hip injection on the left side since you had such success with the right last week. ?Try OTC lidocaine 4% patch by [facility] ? ?

## 2021-09-27 DIAGNOSIS — E1121 Type 2 diabetes mellitus with diabetic nephropathy: Secondary | ICD-10-CM | POA: Diagnosis not present

## 2021-09-27 DIAGNOSIS — M7062 Trochanteric bursitis, left hip: Secondary | ICD-10-CM | POA: Diagnosis not present

## 2021-09-27 DIAGNOSIS — G250 Essential tremor: Secondary | ICD-10-CM | POA: Diagnosis not present

## 2021-09-27 DIAGNOSIS — R69 Illness, unspecified: Secondary | ICD-10-CM | POA: Diagnosis not present

## 2021-09-27 DIAGNOSIS — M533 Sacrococcygeal disorders, not elsewhere classified: Secondary | ICD-10-CM | POA: Diagnosis not present

## 2021-09-27 DIAGNOSIS — M25552 Pain in left hip: Secondary | ICD-10-CM | POA: Diagnosis not present

## 2021-09-27 DIAGNOSIS — M1612 Unilateral primary osteoarthritis, left hip: Secondary | ICD-10-CM | POA: Diagnosis not present

## 2021-09-27 DIAGNOSIS — M545 Low back pain, unspecified: Secondary | ICD-10-CM | POA: Diagnosis not present

## 2021-09-27 DIAGNOSIS — G4700 Insomnia, unspecified: Secondary | ICD-10-CM | POA: Diagnosis not present

## 2021-09-29 ENCOUNTER — Emergency Department (HOSPITAL_COMMUNITY)
Admission: EM | Admit: 2021-09-29 | Discharge: 2021-09-29 | Disposition: A | Discharge status: Home / Self Care | Payer: Medicare HMO | Attending: Emergency Medicine | Admitting: Emergency Medicine

## 2021-09-29 ENCOUNTER — Emergency Department (HOSPITAL_COMMUNITY): Payer: Medicare HMO

## 2021-09-29 ENCOUNTER — Other Ambulatory Visit: Payer: Self-pay

## 2021-09-29 DIAGNOSIS — G4733 Obstructive sleep apnea (adult) (pediatric): Secondary | ICD-10-CM | POA: Diagnosis not present

## 2021-09-29 DIAGNOSIS — R251 Tremor, unspecified: Secondary | ICD-10-CM | POA: Insufficient documentation

## 2021-09-29 DIAGNOSIS — R531 Weakness: Secondary | ICD-10-CM | POA: Diagnosis not present

## 2021-09-29 DIAGNOSIS — M4802 Spinal stenosis, cervical region: Secondary | ICD-10-CM | POA: Diagnosis not present

## 2021-09-29 DIAGNOSIS — I890 Lymphedema, not elsewhere classified: Secondary | ICD-10-CM | POA: Diagnosis not present

## 2021-09-29 DIAGNOSIS — Z853 Personal history of malignant neoplasm of breast: Secondary | ICD-10-CM | POA: Insufficient documentation

## 2021-09-29 DIAGNOSIS — Z7984 Long term (current) use of oral hypoglycemic drugs: Secondary | ICD-10-CM | POA: Insufficient documentation

## 2021-09-29 DIAGNOSIS — Z79899 Other long term (current) drug therapy: Secondary | ICD-10-CM | POA: Diagnosis not present

## 2021-09-29 DIAGNOSIS — E119 Type 2 diabetes mellitus without complications: Secondary | ICD-10-CM | POA: Diagnosis not present

## 2021-09-29 DIAGNOSIS — M5021 Other cervical disc displacement,  high cervical region: Secondary | ICD-10-CM | POA: Diagnosis not present

## 2021-09-29 DIAGNOSIS — Q283 Other malformations of cerebral vessels: Secondary | ICD-10-CM | POA: Diagnosis not present

## 2021-09-29 DIAGNOSIS — M797 Fibromyalgia: Secondary | ICD-10-CM | POA: Diagnosis not present

## 2021-09-29 DIAGNOSIS — G250 Essential tremor: Secondary | ICD-10-CM | POA: Diagnosis not present

## 2021-09-29 DIAGNOSIS — J3489 Other specified disorders of nose and nasal sinuses: Secondary | ICD-10-CM | POA: Diagnosis not present

## 2021-09-29 DIAGNOSIS — R0609 Other forms of dyspnea: Secondary | ICD-10-CM | POA: Insufficient documentation

## 2021-09-29 LAB — T4, FREE: Free T4: 1.09 ng/dL (ref 0.61–1.12)

## 2021-09-29 LAB — CBC WITH DIFFERENTIAL/PLATELET
Abs Immature Granulocytes: 0.01 10*3/uL (ref 0.00–0.07)
Basophils Absolute: 0 10*3/uL (ref 0.0–0.1)
Basophils Relative: 0 %
Eosinophils Absolute: 0 10*3/uL (ref 0.0–0.5)
Eosinophils Relative: 1 %
HCT: 40 % (ref 36.0–46.0)
Hemoglobin: 13.3 g/dL (ref 12.0–15.0)
Immature Granulocytes: 0 %
Lymphocytes Relative: 39 %
Lymphs Abs: 2.5 10*3/uL (ref 0.7–4.0)
MCH: 29 pg (ref 26.0–34.0)
MCHC: 33.3 g/dL (ref 30.0–36.0)
MCV: 87.1 fL (ref 80.0–100.0)
Monocytes Absolute: 0.5 10*3/uL (ref 0.1–1.0)
Monocytes Relative: 7 %
Neutro Abs: 3.4 10*3/uL (ref 1.7–7.7)
Neutrophils Relative %: 53 %
Platelets: 209 10*3/uL (ref 150–400)
RBC: 4.59 MIL/uL (ref 3.87–5.11)
RDW: 13.4 % (ref 11.5–15.5)
WBC: 6.4 10*3/uL (ref 4.0–10.5)
nRBC: 0 % (ref 0.0–0.2)

## 2021-09-29 LAB — PROTIME-INR
INR: 0.9 (ref 0.8–1.2)
Prothrombin Time: 12.5 seconds (ref 11.4–15.2)

## 2021-09-29 LAB — COMPREHENSIVE METABOLIC PANEL
ALT: 17 U/L (ref 0–44)
AST: 16 U/L (ref 15–41)
Albumin: 3.9 g/dL (ref 3.5–5.0)
Alkaline Phosphatase: 70 U/L (ref 38–126)
Anion gap: 5 (ref 5–15)
BUN: 20 mg/dL (ref 8–23)
CO2: 29 mmol/L (ref 22–32)
Calcium: 9 mg/dL (ref 8.9–10.3)
Chloride: 102 mmol/L (ref 98–111)
Creatinine, Ser: 1.02 mg/dL — ABNORMAL HIGH (ref 0.44–1.00)
GFR, Estimated: >60 mL/min (ref >60)
Glucose, Bld: 130 mg/dL — ABNORMAL HIGH (ref 70–99)
Potassium: 3.9 mmol/L (ref 3.5–5.1)
Sodium: 136 mmol/L (ref 135–145)
Total Bilirubin: 1.3 mg/dL — ABNORMAL HIGH (ref 0.3–1.2)
Total Protein: 7.2 g/dL (ref 6.5–8.1)

## 2021-09-29 LAB — CK: Total CK: 101 U/L (ref 38–234)

## 2021-09-29 LAB — TSH: TSH: 1.271 u[IU]/mL (ref 0.350–4.500)

## 2021-09-29 LAB — AMMONIA: Ammonia: <10 umol/L (ref 9–35)

## 2021-09-29 IMAGING — MR MR CERVICAL SPINE WO/W CM
7 of 10 series · 32 of 48 positions shown · IV contrast (gadavist)
Comparison: None.

CLINICAL DATA: Bilateral hand tremors.  History of breast cancer

EXAM:
MRI CERVICAL SPINE WITHOUT AND WITH CONTRAST
TECHNIQUE: Multiplanar and multiecho pulse sequences of the cervical spine, to
include the craniocervical junction and cervicothoracic junction,
were obtained without and with intravenous contrast.
CONTRAST:  10mL GADAVIST GADOBUTROL 1 MMOL/ML IV SOLN

[Series 5: T1 · sagittal · 3.0mm · 0.69mm/px · 3 of 15 slices shown (1 of 3)]
[im 1/15]
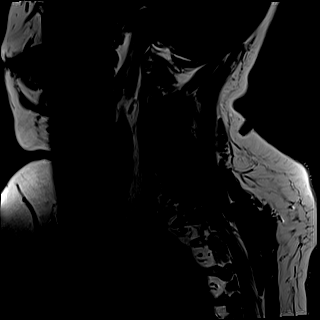
[im 8/15]
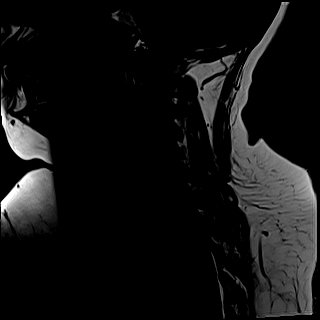
[im 15/15]
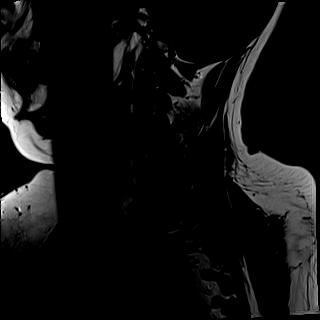

[Series 7: T2 · axial · 3.0mm · 0.70mm/px · z∈[-187,-97]mm · 6 of 29 slices shown (1 of 2)]
[im 1/29]
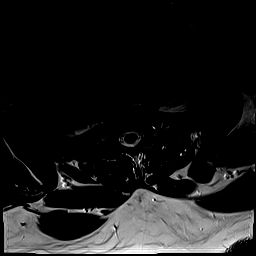
[im 6/29]
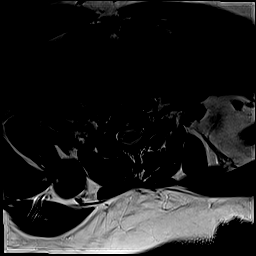
[im 12/29]
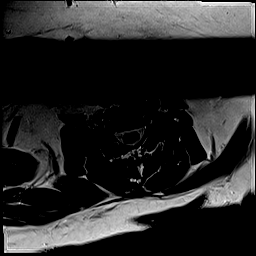
[im 17/29]
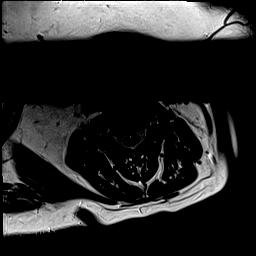
[im 23/29]
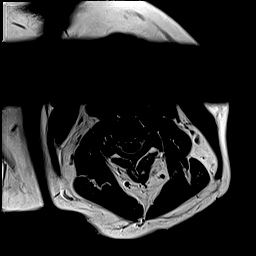
[im 29/29]
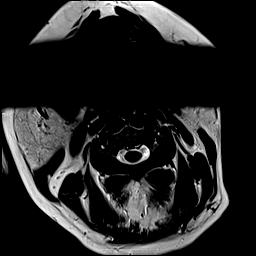

[Series 8: T2 · axial · 3.0mm · 0.70mm/px · z∈[-185,-95]mm · 6 of 29 slices shown (2 of 2)]
[im 1/29]
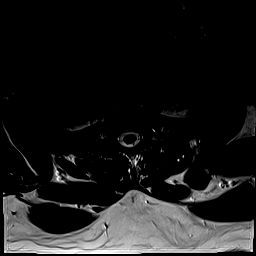
[im 6/29]
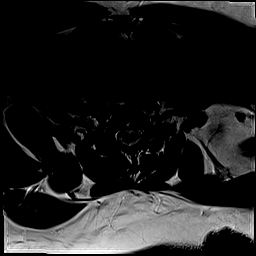
[im 12/29]
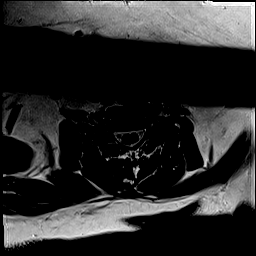
[im 17/29]
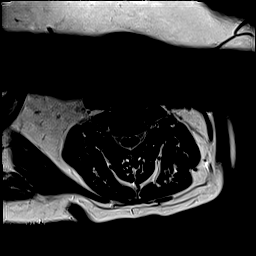
[im 23/29]
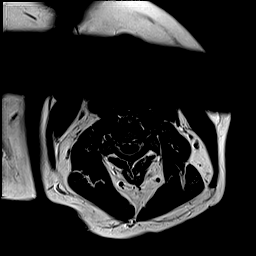
[im 29/29]
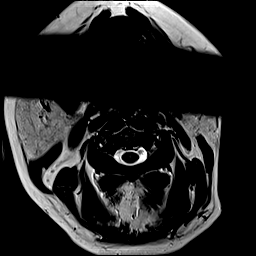

[Series 10: T1 · axial · 3.0mm · 0.35mm/px · z∈[-185,-95]mm · 6 of 29 slices shown (2 of 3)]
[im 1/29]
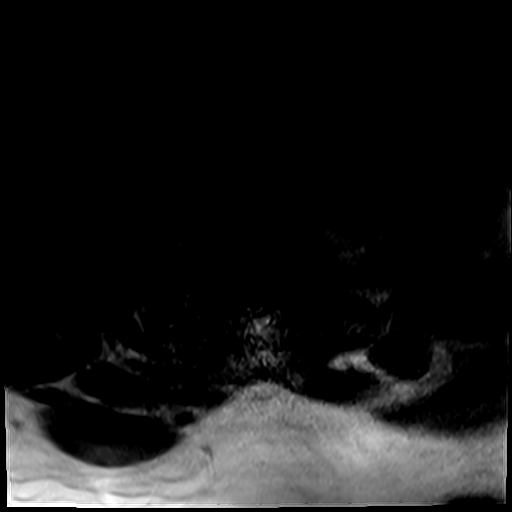
[im 6/29]
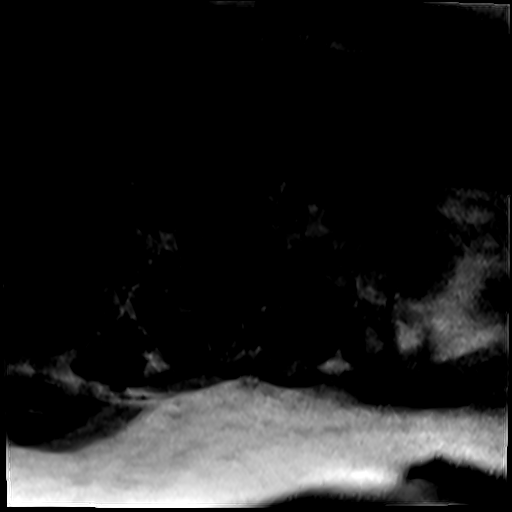
[im 12/29]
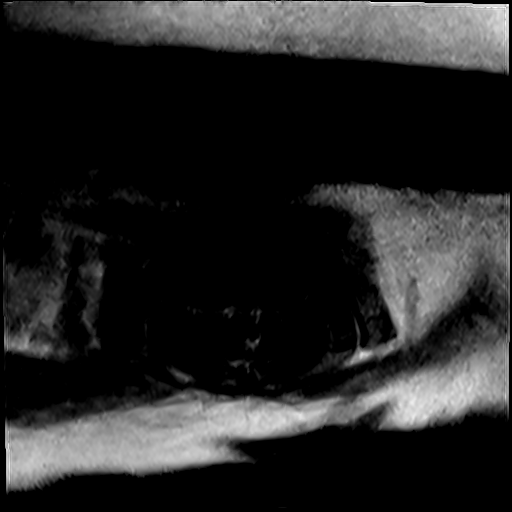
[im 17/29]
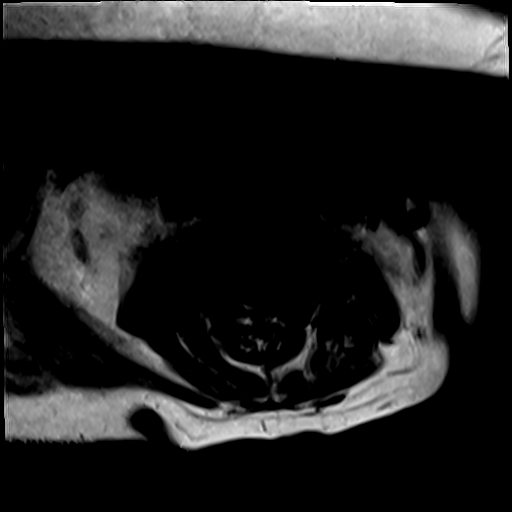
[im 23/29]
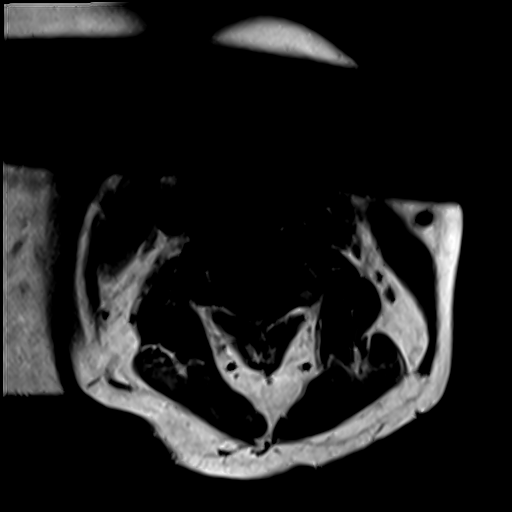
[im 29/29]
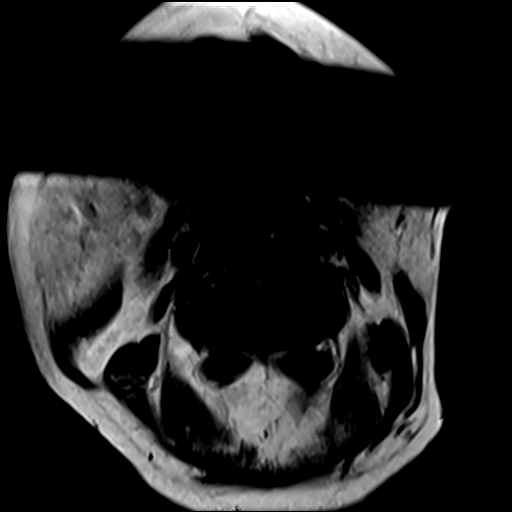

[Series 11: T1 · axial · 3.0mm · 0.35mm/px · z∈[-185,-95]mm · 6 of 29 slices shown (3 of 3)]
[im 1/29]
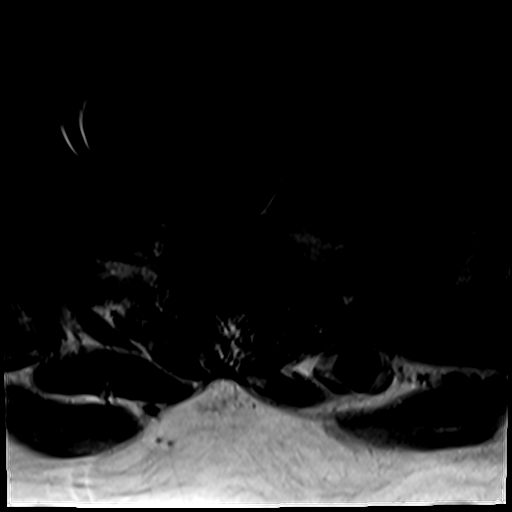
[im 6/29]
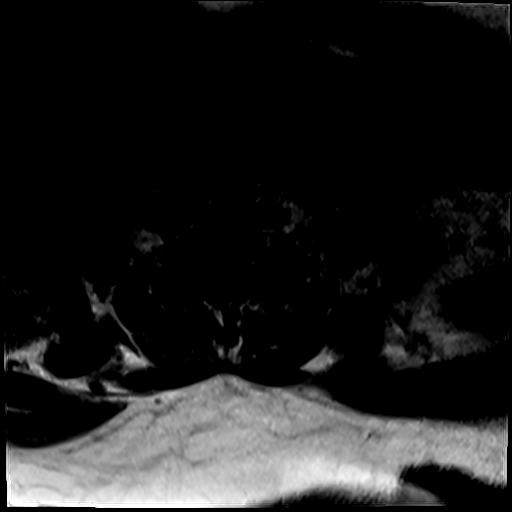
[im 12/29]
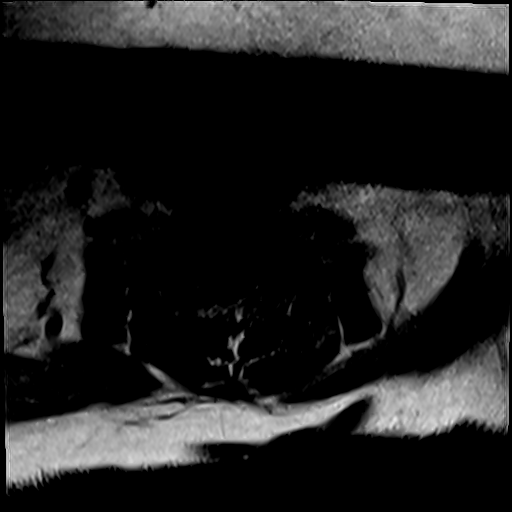
[im 17/29]
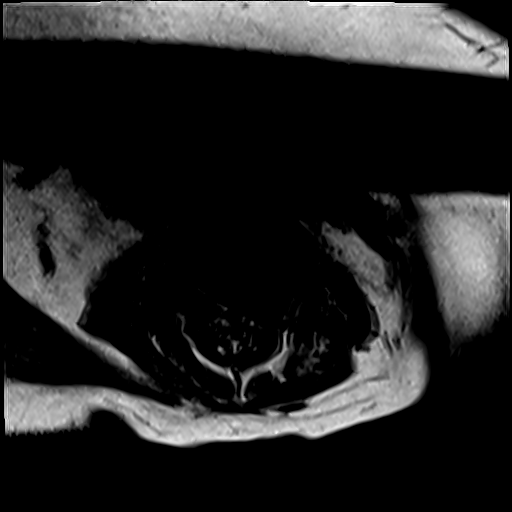
[im 23/29]
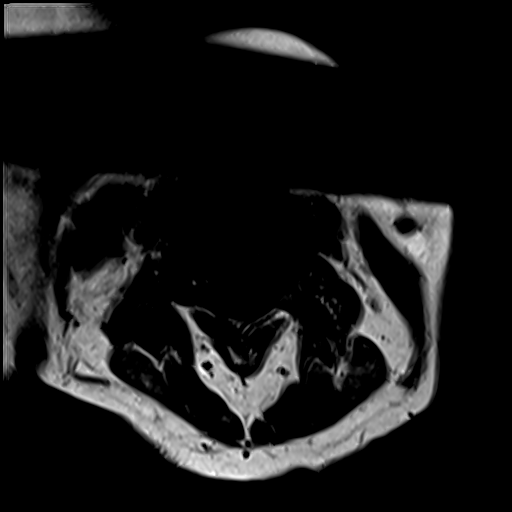
[im 29/29]
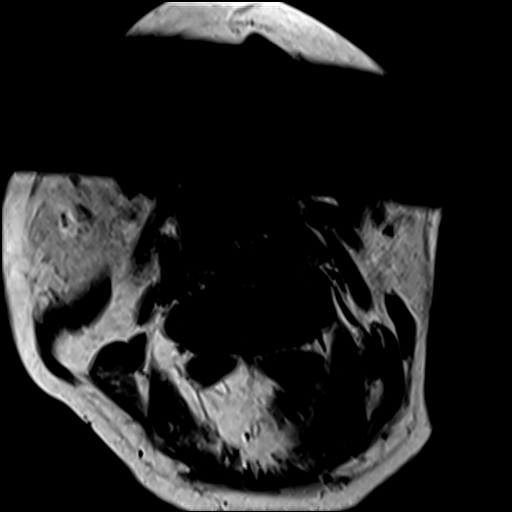

[Series 12: T2 post-contrast · sagittal · 3.0mm · 0.69mm/px · 3 of 15 slices shown]
[im 1/15]
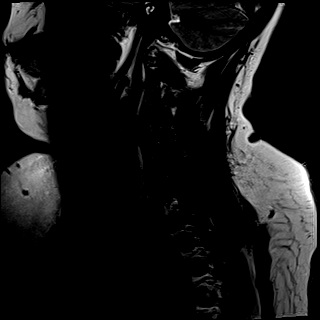
[im 8/15]
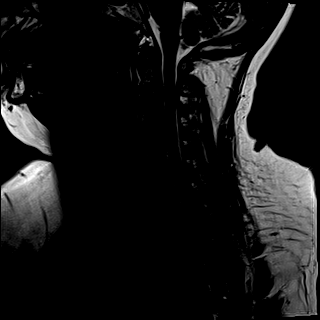
[im 15/15]
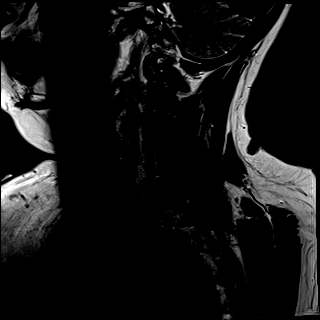

[Series 13: T1 fat-sat post-contrast · sagittal · 3.0mm · 0.69mm/px · 2 of 15 slices shown]
[im 1/15]
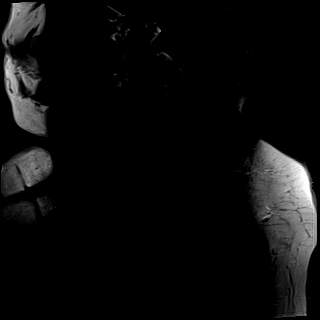
[im 8/15]
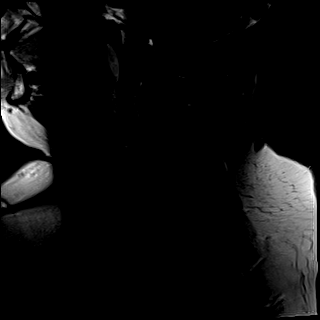

[32 of 48 positions shown; findings below may reference images not displayed]

FINDINGS: Technical Note: Despite efforts by the technologist and patient,
motion artifact is present on today's exam and could not be
eliminated. This reduces exam sensitivity and specificity.

Alignment: Physiologic.

Vertebrae: No fracture, evidence of discitis, or bone lesion. No
abnormal postcontrast enhancement evident.

Cord: Normal signal and morphology.

Posterior Fossa, vertebral arteries, paraspinal tissues: Negative.

Disc levels:

C2-C3: Left paracentral disc protrusion with 4 mm of cranial
extension. Disc results in mild impress upon the left hemicord and
mild canal stenosis. No foraminal stenosis.

C3-C4: No significant disc protrusion, foraminal stenosis, or canal
stenosis.

C4-C5: Minimal disc bulge and mild right uncovertebral spurring. No
foraminal or canal stenosis.

C5-C6: No significant disc protrusion, foraminal stenosis, or canal
stenosis.

C6-C7: No significant disc protrusion, foraminal stenosis, or canal
stenosis.

C7-T1: No disc bulge. Mild bilateral facet arthropathy resulting in
mild left foraminal stenosis. No canal stenosis.
IMPRESSION: 1. No evidence of metastatic disease or abnormal postcontrast
enhancement.
2. Left paracentral disc protrusion at C2-C3 resulting in mild
impress upon the left hemicord and mild canal stenosis.
3. Mild left foraminal stenosis at C7-T1.

## 2021-09-29 MED ORDER — GADOBUTROL 1 MMOL/ML IV SOLN
10.0000 mL | Freq: Once | INTRAVENOUS | Status: AC | PRN
  Administered 2021-09-29: 10 mL via INTRAVENOUS

## 2021-09-29 MED ORDER — HYDROCODONE-ACETAMINOPHEN 5-325 MG PO TABS
1.0000 | ORAL_TABLET | Freq: Once | ORAL | Status: AC
  Administered 2021-09-29: 1 via ORAL
  Filled 2021-09-29: qty 1

## 2021-09-29 MED ORDER — ONDANSETRON 4 MG PO TBDP
4.0000 mg | ORAL_TABLET | Freq: Once | ORAL | Status: AC
  Administered 2021-09-29: 4 mg via ORAL
  Filled 2021-09-29: qty 1

## 2021-09-29 MED ORDER — FENTANYL CITRATE PF 50 MCG/ML IJ SOSY
50.0000 ug | PREFILLED_SYRINGE | Freq: Once | INTRAMUSCULAR | Status: AC
  Administered 2021-09-29: 50 ug via INTRAVENOUS
  Filled 2021-09-29: qty 1

## 2021-09-29 MED ORDER — LORAZEPAM 2 MG/ML IJ SOLN
1.0000 mg | Freq: Once | INTRAMUSCULAR | Status: AC | PRN
  Administered 2021-09-29: 1 mg via INTRAVENOUS
  Filled 2021-09-29: qty 1

## 2021-09-29 NOTE — Discharge Instructions (Addendum)
Please follow-up with your pain control doctor for any increased pain medications that you need. ?Contact a health care provider if: ?You develop a tremor after starting a new medicine. ?You have a tremor along with other symptoms such as: ?Numbness. ?Tingling. ?Pain. ?Weakness. ?Your tremor gets worse. ?Your tremor interferes with your day-to-day life. ?

## 2021-09-29 NOTE — ED Notes (Addendum)
Ambulated to RR and back without assistance, steady gait noted.  ?

## 2021-09-29 NOTE — ED Provider Notes (Signed)
?[facility] DEPT ?Provider Note ? ? ?CSN: [id number] ?Arrival date & time: 09/29/21  0700 ? ?  ? ?History ? ?Chief Complaint  ?Patient presents with  ? Tremors  ? ? ?Kelsey KLAHN is a 61 y.o. female with a past medical history of fibromyalgia, spinal stenosis and neurogenic claudication of the lumbar spine, history of breast cancer, toxic peripheral neuropathy and peripheral positional vertigo, obstructive sleep apnea noncompliant with her CPAP, morbid obesity, lymphedema of the left arm, type 2 diabetes and exertional dyspnea, who presents emergency department with chief complaint of tremulousness and weakness.  Patient reports that she is having shaking of her mouth and face as well as both of her hands along with tingling and numbness in her fingers.  She states that this has been going on for 1 year and has been progressively worsening.  Patient notes that she is having significant difficulty performing ADLs and driving.  Patient states that her symptoms have gotten so bad that she decided to come be evaluated for today.  She has no new medications or medication changes is not on any antipsychotic medications.  She denies alcohol use.  She states that she has not seen anyone about this severe tremulousness in the past.  She walks daily with a walker due to her history of problems, and knee and hip pain.  She is status post bilateral TKA.  Patient has a history of posterior vertigo and states that she is frequently off balance as well.  She denies any recent falls. ? ?HPI ? ?  ? ?Home Medications ?Prior to Admission medications   ?Medication Sig Start Date End Date Taking? Authorizing Provider  ?ACCU-CHEK AVIVA PLUS test strip TEST WHILE FASTING AND 2 HOURS AFTER SUPPER 10/01/18   [provider]  ?Accu-Chek Softclix Lancets lancets TEST AS DIRECTED AND 2 HOURS AFTER SUPPER 12/04/18   [provider]  ?ALPRAZolam Duanne Moron) 0.5 MG tablet Take 0.5 mg by mouth at  bedtime. 07/31/20   [provider]  ?ALPRAZolam Duanne Moron) 1 MG tablet alprazolam 1 mg tablet    [provider]  ?B-D ULTRAFINE III SHORT PEN 31G X 8 MM MISC USE ONE NEEDLE D 12/03/18   [provider]  ?benzonatate (TESSALON) 100 MG capsule Take 2 capsules (200 mg total) by mouth 3 (three) times daily as needed for cough. 06/01/19   Baird Lyons D, MD  ?clindamycin (CLEOCIN) 300 MG capsule Take 600 mg by mouth once as needed. 01/09/21   [provider]  ?cyclobenzaprine (FLEXERIL) 5 MG tablet TAKE 1 TABLET BY MOUTH TWICE A DAY AS NEEDED FOR MUSCLE SPASMS 07/04/21   Bayard Hugger, NP  ?DEXILANT 30 MG capsule Take 30 mg by mouth daily. 08/01/20   [provider]  ?dicyclomine (BENTYL) 10 MG capsule Take 1 capsule (10 mg total) by mouth 3 (three) times daily as needed for spasms (abdominal pain). 11/30/20   Noralyn Pick, NP  ?Dulaglutide (TRULICITY) 1.5 OM/3.5DH SOPN Trulicity 1.5 RC/1.6 mL subcutaneous pen injector    [provider]  ?DULoxetine (CYMBALTA) 60 MG capsule Take 60 mg by mouth daily.  06/28/16   [provider]  ?furosemide (LASIX) 20 MG tablet Take 20 mg by mouth daily. 07/07/20   [provider]  ?gabapentin (NEURONTIN) 300 MG capsule TAKE 1 CAPSULE BY MOUTH THREE TIMES A DAY 08/07/21   Bayard Hugger, NP  ?glipiZIDE (GLUCOTROL) 10 MG tablet Take 10 mg by mouth daily. 08/01/20   [provider]  ?INGREZZA 80 MG capsule Take 80 mg by mouth daily. 07/27/21   [provider]  ?levocetirizine (XYZAL) 5 MG tablet Take 5 mg by mouth daily. 12/21/20   [provider]  ?lisinopril (ZESTRIL) 5 MG tablet Take 5 mg by mouth daily. 12/12/18   [provider]  ?metoprolol succinate (TOPROL-XL) 25 MG 24 hr tablet Take 12.5 mg by mouth daily. 07/15/20   [provider]  ?montelukast (SINGULAIR) 10 MG tablet Take 10 mg by mouth at bedtime.    [provider]  ?naloxone (NARCAN) nasal spray 4  mg/0.1 mL Place 1 spray into the nose daily as needed (Overdose).    [provider]  ?ondansetron (ZOFRAN-ODT) 4 MG disintegrating tablet Dissolve one tablet on the tongue every 8 hours as needed for nausea and vomiting. 11/30/20   Noralyn Pick, NP  ?predniSONE (STERAPRED UNI-PAK 21 TAB) 10 MG (21) TBPK tablet Take as directed 03/09/21   Landis Martins, DPM  ?rosuvastatin (CRESTOR) 10 MG tablet Take 10 mg by mouth at bedtime. 07/15/20   [provider]  ?SUPREP BOWEL PREP KIT 17.5-3.13-1.6 GM/177ML SOLN Take 1 kit by mouth as directed. For colonoscopy prep 11/30/20   Noralyn Pick, NP  ?temazepam (RESTORIL) 15 MG capsule Take 1 capsule (15 mg total) by mouth at bedtime as needed for sleep. 03/03/19   Deneise Lever, MD  ?traMADol (ULTRAM) 50 MG tablet Take 1- 2  tablets three times a day as needed for pain. 08/25/21   Bayard Hugger, NP  ?TRULICITY 5.46 EV/0.3JK SOPN Inject 0.75 mg into the skin once a week. 07/16/19   [provider]  ?TRULICITY 1.5 KX/3.8HW SOPN Inject into the skin. 07/28/21   [provider]  ?umeclidinium-vilanterol (ANORO ELLIPTA) 62.5-25 MCG/INH AEPB Inhale 1 puff into the lungs daily. 03/03/19   Deneise Lever, MD  ?Valbenazine Tosylate 40 MG CAPS Take 40 mg by mouth daily. [place]    [provider]  ?   ? ?Allergies    ?Aleve [naproxen], Compazine [prochlorperazine], Hydrocodone, Oxycodone, and Penicillins   ? ?Review of Systems   ?Review of Systems ? ?Physical Exam ?Updated Vital Signs ?BP (!) 154/93 (BP Location: Right Arm)   Pulse 92   Temp 97.8 ?F (36.6 ?C) (Oral)   Resp 18   SpO2 97%  ?Physical Exam ?Vitals and nursing note reviewed.  ?Constitutional:   ?   General: She is not in acute distress. ?   Appearance: She is well-developed. She is not diaphoretic.  ?HENT:  ?   Head: Normocephalic and atraumatic.  ?   Right Ear: External ear normal.  ?   Left Ear: External ear normal.  ?   Nose: Nose normal.  ?    Mouth/Throat:  ?   Mouth: Mucous membranes are moist.  ?Eyes:  ?   General: No scleral icterus. ?   Conjunctiva/sclera: Conjunctivae normal.  ?Cardiovascular:  ?   Rate and Rhythm: Normal rate and regular rhythm.  ?   Heart sounds: Normal heart sounds. No murmur heard. ?  No friction rub. No gallop.  ?Pulmonary:  ?   Effort: Pulmonary effort is normal. No respiratory distress.  ?   Breath sounds: Normal breath sounds.  ?Abdominal:  ?   General: Bowel sounds are normal. There is no distension.  ?   Palpations: Abdomen is soft. There is no mass.  ?   Tenderness: There is no abdominal tenderness. There is no guarding.  ?Musculoskeletal:  ?  Cervical back: Normal range of motion.  ?Skin: ?   General: Skin is warm and dry.  ?Neurological:  ?   Mental Status: She is alert and oriented to person, place, and time.  ?   Comments: Patient has intact cranial nerves.  Noted facial tremulousness with movement of the facial muscles.  Otherwise flat, masklike facies at rest. ?She has global weakness of the upper and lower extremities, 4 out of 5 strength.  She has significant dysmetria with severe intention tremor especially on the left.  No significant dysmetria or significant tremor on the right.  No pronator drift.  Muted reflexes in the upper and lower extremities.  No clonus at the ankle.  Grip strengths are equal bilaterally.  Sensation is equal bilaterally.  ?Psychiatric:     ?   Behavior: Behavior normal.  ? ? ?ED Results / Procedures / Treatments   ?Labs ?(all labs ordered are listed, but only abnormal results are displayed) ?Labs Reviewed - No data to display ? ?EKG ?None ? ?Radiology ?No results found. ? ?Procedures ?Procedures  ? ? ?Medications Ordered in ED ?Medications - No data to display ? ?ED Course/ Medical Decision Making/ A&P ?Clinical Course as of 09/29/21 1612  ?Fri Sep 29, 2021  ?48 61 y/o F w/ tremor and weakness.  ?DDX includes recurrent metastatic breast cancer with CNS lesion, MS, electrolyte  distubance, hyperthryroid, myelopathy, myopathy,cerebellar stroke/ dysfunction, serotonin syndrome, liver flap. I doubt, etoh/ benzo withdrawal, other toxicity. ?Given facial and left upper extremity weakness I have high concern

## 2021-09-29 NOTE — ED Triage Notes (Signed)
Complains of bilateral hand tremors with movement like driving or holding her purse since December, has seen PCP for this issue. Reports pain in fingertips due to neuropathy.  ?

## 2021-09-30 LAB — T3, FREE: T3, Free: 3.4 pg/mL (ref 2.0–4.4)

## 2021-10-02 ENCOUNTER — Ambulatory Visit: Payer: Medicare HMO | Attending: Student

## 2021-10-02 DIAGNOSIS — M6281 Muscle weakness (generalized): Secondary | ICD-10-CM | POA: Diagnosis not present

## 2021-10-02 DIAGNOSIS — M545 Low back pain, unspecified: Secondary | ICD-10-CM | POA: Insufficient documentation

## 2021-10-02 NOTE — Therapy (Signed)
?OUTPATIENT PHYSICAL THERAPY TREATMENT NOTE ? ? ?Patient Name: Kelsey Wilson ?MRN: [id number] ?DOB:1960/07/01, 61 y.o., female ?Today's Date: 10/02/2021 ? ?PCP: Vonna Drafts, FNP ?REFERRING PROVIDER: Eleonore Chiquito* ? ?END OF SESSION:  ? PT End of Session - 10/02/21 0959   ? ? Visit Number 3   ? Number of Visits 6   ? Date for PT Re-Evaluation 10/18/21   ? Authorization Type [employer] MCR   ? Progress Note Due on Visit 6   ? PT Start Time 1000   ? PT Stop Time 2025   ? PT Time Calculation (min) 40 min   ? Activity Tolerance Patient tolerated treatment well   ? Behavior During Therapy [facility] for tasks assessed/performed   ? ?  ?  ? ?  ? ? ?Past Medical History:  ?Diagnosis Date  ? Anemia   ? Anxiety   ? Arthritis   ? Breast cancer ([place]) 2010  ? T3N1 invasive ductal carcinoma left breast.Takes Arimidex daily  ? Bursitis   ? Carpal tunnel syndrome   ? Chronic back pain   ? stenosis  ? Constipation   ? takes Colace daily  ? Depression   ? takes Benzotropine daily  ? Diverticulitis of colon   ? Dyspnea   ? daily when walking for over 1 yr.  ? Fibromyalgia 08/2012  ? GERD (gastroesophageal reflux disease)   ? takes Dexilant daily  ? Hemorrhoid   ? History of blood transfusion   ? no abnormal reaction noted  ? History of colon polyps   ? benign  ? History of shingles   ? Joint pain   ? Joint swelling   ? Morbid obesity ([place])   ? Night muscle spasms   ? takes Flexeril nightly as needed  ? Nocturia   ? OSA (obstructive sleep apnea)   ? OSA on CPAP   ? Peripheral edema   ? takes Furosemide.Just started 01/18/16  ? Peripheral neuropathy   ? takes Lyrica daily  ? Personal history of chemotherapy   ? 2013  ? Personal history of radiation therapy   ? 2013  ? Pneumonia   ? hx of-2015  ? Pseudoarthrosis of lumbar spine   ? Seasonal allergies   ? takes Singulair nightly  ? SOB (shortness of breath) on exertion   ? rarely with exertion  ? Splenorenal shunt malfunction ([place])   ? 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 Alphonzo Grieve)  ? Type II diabetes mellitus ([place])   ? ?Past Surgical History:  ?Procedure Laterality Date  ? ABDOMINAL HYSTERECTOMY    ? still has ovaries  ? APPENDECTOMY    ? AXILLARY LYMPH NODE DISSECTION  11/28/2011  ? Procedure: AXILLARY LYMPH NODE DISSECTION;  Surgeon: Edward Jolly, MD;  Location: [place];  Service: General;  Laterality: Left;  ? BREAST LUMPECTOMY Left 11/28/2011  ? Malignant  ? BREAST SURGERY Left 2013  ? CARPAL TUNNEL RELEASE    ? Bilateral  ? CESAREAN SECTION    ? pt. has had 3  ? CHOLECYSTECTOMY    ? COLONOSCOPY    ? COLONOSCOPY WITH PROPOFOL N/A 01/24/2021  ? Procedure: COLONOSCOPY WITH PROPOFOL;  Surgeon: Doran Stabler, MD;  [facility]: [facility];  Service: Gastroenterology;  Laterality: N/A;  ? ESOPHAGOGASTRODUODENOSCOPY (EGD) WITH PROPOFOL N/A 01/24/2021  ? Procedure: ESOPHAGOGASTRODUODENOSCOPY (EGD) WITH PROPOFOL;  Surgeon: Doran Stabler, MD;  [facility]: [facility];  Service:  Gastroenterology;  Laterality: N/A;  ? EYE SURGERY Bilateral   ? cataract removal  ? KNEE SURGERY    ? Left Knee  ? LAMINECTOMY WITH POSTERIOR LATERAL ARTHRODESIS LEVEL 1 N/A 11/29/2017  ? Procedure: Posterior Lateral Fusion - Lumbar Four-Lumbar Five, removal and replacement of hardware, Laminectomy - Lumbar Four-Lumbar Five;  Surgeon: Eustace Moore, MD;  Location[facility];  Service: Neurosurgery;  Laterality: N/A;  ? LAMINECTOMY WITH POSTERIOR LATERAL ARTHRODESIS LEVEL 2 N/A 06/07/2017  ? Procedure: Posterior Lateral Fusion Lumbar Three-Four and Transforaminal Interbody Fusion Lumbar Four-Five with Segmental  Pedicle Screw Fixation;  Surgeon: Eustace Moore, MD;  Location: [place];  Service: Neurosurgery;  Laterality: N/A;  Posterior Lateral Fusion Lumbar Three-Four and Transforaminal Interbody Fusion Lumbar Four-Five with Segmental  Pedicle Screw Fixation   ? LUMBAR FUSION  06/07/2017  ? POST  ? LUMBAR LAMINECTOMY/DECOMPRESSION  MICRODISCECTOMY Bilateral 01/27/2016  ? Procedure: Laminectomy and Foraminotomy - Lumbar four -Lumbar five - bilateral- on-lay noninstrumented fusion;  Surgeon: Eustace Moore, MD;  Location: [place] [facility];  Service: Neurosurgery;  Laterality: Bilateral;  ? MULTIPLE EXTRACTIONS WITH ALVEOLOPLASTY N/A 05/11/2016  ? Procedure: EXTRACTION OF TEETH EIGHTEEN, TWENTY AND TWENTY- NINE;  REMOVAL OF MANDIBULAR TORUS AND EXOSTOSIS;  Surgeon: Diona Browner, DDS;  Location: [place];  Service: Oral Surgery;  Laterality: N/A;  ? POLYPECTOMY  01/24/2021  ? Procedure: POLYPECTOMY;  Surgeon: Doran Stabler, MD;  Location: [facility];  Service: Gastroenterology;;  ? PORTACATH PLACEMENT  06/18/2011  ? Procedure: INSERTION PORT-A-CATH;  Surgeon: Edward Jolly, MD;  Location: [place];  Service: General;  Laterality: Right;  right subclavian  ? removal portacath  2014  ? spur    ? Apex spur on both big toes  ? TOE SURGERY Bilateral   ? TONSILLECTOMY    ? TOTAL KNEE ARTHROPLASTY Left 09/13/2014  ? TOTAL KNEE ARTHROPLASTY Left 09/13/2014  ? Procedure: LEFT TOTAL KNEE ARTHROPLASTY;  Surgeon: Rod Can, MD;  Location: [place];  Service: Orthopedics;  Laterality: Left;  ? TOTAL KNEE ARTHROPLASTY Right 03/10/2015  ? Procedure: RIGHT TOTAL KNEE ARTHROPLASTY;  Surgeon: Rod Can, MD;  Location: [facility];  Service: Orthopedics;  Laterality: Right;  ? ?Patient Active Problem List  ? Diagnosis Date Noted  ? Morbid obesity ([place])   ? Type II diabetes mellitus ([place])   ? GERD (gastroesophageal reflux disease) 06/21/2019  ? Insomnia 03/03/2019  ? Ankle arthritis 12/12/2018  ? Muscle weakness 12/12/2018  ? Obstructive sleep apnea 10/23/2018  ? Asthmatic bronchitis 10/23/2018  ? Cellulitis of internal cheek, left 01/23/2018  ? Lung nodule 09/15/2017  ? Morbid obesity with BMI of 45.0-49.9, adult ([place]) 09/05/2017  ? Arthritis of carpometacarpal (CMC) joints of both thumbs 03/14/2017  ? Spinal stenosis, lumbar region, with  neurogenic claudication 03/07/2017  ? Vertigo 10/12/2016  ? Peripheral positional vertigo 08/28/2016  ? Abnormal auditory perception of both ears 07/27/2016  ? Neuropathic pain 06/25/2016  ? 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 S/P lumbar spinal fusion 01/27/2016  ? H/O therapeutic radiation 09/21/2015  ? Personal history of breast cancer 09/21/2015  ? Pes planus of both feet 08/04/2015  ? Primary osteoarthritis of right knee 03/10/2015  ? Osteoarthritis of right knee 02/04/2015  ? Primary osteoarthritis of left knee 09/13/2014  ? Hot flashes 01/19/2014  ? Abdominal pain, unspecified site 01/19/2014  ? Malignant neoplasm of upper-outer quadrant of left breast in female, estrogen receptor positive ([place]) 03/19/2013  ? Lumbosacral spondylosis without myelopathy 12/30/2012  ?  Fibromyalgia syndrome 08/15/2012  ? Peripheral neuropathy, toxic 08/15/2012  ? Lymphedema of arm - left 04/30/2012  ? ? ?REFERRING DIAG: M54.16 (ICD-10-CM) - Radiculopathy, lumbar region  ? ?THERAPY DIAG:  ?Muscle weakness (generalized) ? ?Lumbosacral pain ? ?PERTINENT HISTORY: 1. Chronic Sacroiliac Joint Pain: S/P Bilateral Sacroilliac Injection with Dr. Letta Pate on 06/23/2019. With good relief noted. 08/22/2021. ?2.. Lumbar Postlaminectomy/ S/P Lumbar Fusion:Spinal Stenosis:S/P Posterior Lateral Fusion L-4-L-5 removal and replacement of hardware, Laminectomy L4-L5 by Dr. Ronnald Ramp on 05/22/2018. Continue HEP as Tolerated and Continue current medication regimen. 08/22/2021   ? ?PRECAUTIONS: Back ? ?SUBJECTIVE: Reporting elevated pain levels ongoing for 2 weeks, 8/10 which is above her baseline.  Will f/u with Dr. Ronnald Ramp 10/03/21 ? ?PAIN:  ?Are you having pain? Yes: NPRS scale: 8/10 in central low back, no radicular symptoms ? ? ? ? ?  ?OBJECTIVE:  ?  ?DIAGNOSTIC FINDINGS:  ?None current ?  ?PATIENT SURVEYS:  ?FOTO 30 ?  ?SCREENING FOR RED FLAGS: ?Bowel or bladder incontinence: No ?  ?COGNITION: ?          Overall cognitive status: Within functional  limits for tasks assessed               ?           ?SENSATION: ?WFL ?  ?MUSCLE LENGTH: ?Hamstrings: Right 40 deg; Left 40 deg ?Thomas test: UTA due to poor tolerance of supine position ?  ?POSTURE:  ?R lateral lean, forward f

## 2021-10-03 ENCOUNTER — Encounter: Payer: Self-pay | Admitting: Neurology

## 2021-10-03 ENCOUNTER — Ambulatory Visit (INDEPENDENT_AMBULATORY_CARE_PROVIDER_SITE_OTHER): Payer: Medicare HMO | Admitting: Neurology

## 2021-10-03 VITALS — BP 125/85 | HR 79 | Ht 62.0 in | Wt 277.0 lb

## 2021-10-03 DIAGNOSIS — G20 Parkinson's disease: Secondary | ICD-10-CM | POA: Diagnosis not present

## 2021-10-03 DIAGNOSIS — Z6841 Body Mass Index (BMI) 40.0 and over, adult: Secondary | ICD-10-CM | POA: Diagnosis not present

## 2021-10-03 DIAGNOSIS — G2571 Drug induced akathisia: Secondary | ICD-10-CM | POA: Diagnosis not present

## 2021-10-03 DIAGNOSIS — Z79899 Other long term (current) drug therapy: Secondary | ICD-10-CM | POA: Diagnosis not present

## 2021-10-03 DIAGNOSIS — M5416 Radiculopathy, lumbar region: Secondary | ICD-10-CM | POA: Diagnosis not present

## 2021-10-03 NOTE — Progress Notes (Signed)
Subjective:  ?  ?Patient ID: Kelsey Wilson is a 61 y.o. female. ? ?HPI ? ? ? ?Kelsey Age, MD, PhD ?[facility] ?[address], [address] ?[id number] ?[place], [place] [address] ? ?I saw patient, Kelsey Wilson, as a referral from the emergency room for tremors.  The patient is accompanied by her aunt today, who is not able to provide much in the way of details to her history.  The patient is unable to provide details to her history as to what medication she has been for how long.  Kelsey Wilson is a 61 year old right-handed woman with an underlying complex medical history of peripheral edema, peripheral neuropathy, OSA, morbid obesity, type 2 diabetes, seasonal allergies, anemia, arthritis, history of breast cancer, carpal tunnel syndrome, chronic back pain, depression, anxiety, diverticulitis, fibromyalgia, reflux disease and edema, who reports a several month history of tremors affecting both upper extremities, face, feeling restless.  She presented to the emergency room at [facility] on 09/23/2021 with a chief complaint of tremors.  She reported trembling of her mouth and face as well as hands and numbness and tingling in her fingers.  Symptoms were ongoing for about a year.  Of note, she is on multiple medications including several psychotropic medications.  Specifically, she is on Xanax, Flexeril, Cymbalta, gabapentin, Ingrezza, temazepam, full list of medication as below. ?I reviewed the ER records.  She was noted to have an intention tremor on the left and facial trembling and masked facies.  She had a brain MRI with and without contrast on 09/29/2021 and I reviewed the results: IMPRESSION: ?1. Mildly motion degraded. No metastatic disease or acute ?intracranial abnormality identified. ?  ?2. Stable MRI appearance of the brain since 2018. 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 ? ?Spine MRI with and without contrast on  09/29/2021 and I reviewed the results: IMPRESSION: ?1. No evidence of metastatic disease or abnormal postcontrast ?enhancement. ?2. Left paracentral disc protrusion at C2-C3 resulting in mild ?impress upon the left hemicord and mild canal stenosis. ?3. Mild left foraminal stenosis at C7-T1. ?Laboratory work-up showed normal TSH at 1.271 on 09/29/2021, free T3 normal at 3.4, free T4 normal at 1.09, CK level normal at 101, CMP showed sodium of 136, potassium 3.9, glucose 130, BUN 20, creatinine 1.02, total bilirubin slightly elevated at 1.3.  CBC with differential with benign findings, no white cell count elevation, ammonia level less than 10.  INR 0.9. ? ?She had seen Dr. Jannifer Wilson some 5 years ago for vertigo. ? ?The patient has no family history of Parkinson's disease.  She is on Ingrezza for years, unclear who is the original prescriber for this.  According to her primary care, Ms. Kelsey Cooley, NP, who was briefly on the phone with the patient, patient was on Ingrezza years ago for TD.  Unclear which medication actually caused the TD as current list of medication does not list any antipsychotic medication and the patient does not recall trying Haldol, Risperdal, Geodon or Abilify.  She is unable to provide a detailed history, she is unable to say who originally prescribed the Ingrezza, she is currently not followed by psychiatry and has not seen a psychiatrist in years but would like to see 1.  She is on gabapentin for neuropathy pain, she also takes Ultram, she has a prescription for her naloxone but is currently not on morphine or oxycodone or hydrocodone but is on a relatively high dose of tramadol.  She  is also on a high dose of Robaxin.  She takes Cymbalta, this is prescribed by primary care and she also takes Xanax through primary care as understand.  She feels restless during the appointment and feels that she cannot sit still.  She gets up several times while go to the exam table and laying on the table.   She leans her elbows on the exam table, bending over.  She feels that it causes her to feel less restless in her body.  She has symptoms of akathisia. ? ?She is single, she lives with her 2 grandsons, ages 7 and 82, the 80 year old is her younger son's child and the 95 year old is her older son stroke.  Patient has 2 sons and 1 daughter. ? ?Her Past Medical History Is Significant For: ?Past Medical History:  ?Diagnosis Date  ? Anemia   ? Anxiety   ? Arthritis   ? Breast cancer ([place]) 2010  ? T3N1 invasive ductal carcinoma left breast.Takes Arimidex daily  ? Bursitis   ? Carpal tunnel syndrome   ? Chronic back pain   ? stenosis  ? Constipation   ? takes Colace daily  ? Depression   ? takes Benzotropine daily  ? Diverticulitis of colon   ? Dyspnea   ? daily when walking for over 1 yr.  ? Fibromyalgia 08/2012  ? GERD (gastroesophageal reflux disease)   ? takes Dexilant daily  ? Hemorrhoid   ? History of blood transfusion   ? no abnormal reaction noted  ? History of colon polyps   ? benign  ? History of shingles   ? Joint pain   ? Joint swelling   ? Morbid obesity ([place])   ? Night muscle spasms   ? takes Flexeril nightly as needed  ? Nocturia   ? OSA (obstructive sleep apnea)   ? OSA on CPAP   ? Peripheral edema   ? takes Furosemide.Just started 01/18/16  ? Peripheral neuropathy   ? takes Lyrica daily  ? Personal history of chemotherapy   ? 2013  ? Personal history of radiation therapy   ? 2013  ? Pneumonia   ? hx of-2015  ? Pseudoarthrosis of lumbar spine   ? Seasonal allergies   ? takes Singulair nightly  ? SOB (shortness of breath) on exertion   ? rarely with exertion  ? Splenorenal shunt malfunction ([place])   ? 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 Alphonzo Grieve)  ? Type II diabetes mellitus ([place])   ? ? ?Her Past Surgical History Is Significant For: ?Past Surgical History:  ?Procedure Laterality Date  ? ABDOMINAL HYSTERECTOMY    ? still has  ovaries  ? APPENDECTOMY    ? AXILLARY LYMPH NODE DISSECTION  11/28/2011  ? Procedure: AXILLARY LYMPH NODE DISSECTION;  Surgeon: Edward Jolly, MD;  Location: [place];  Service: General;  Laterality: Left;  ? BREAST LUMPECTOMY Left 11/28/2011  ? Malignant  ? BREAST SURGERY Left 2013  ? CARPAL TUNNEL RELEASE    ? Bilateral  ? CESAREAN SECTION    ? pt. has had 3  ? CHOLECYSTECTOMY    ? COLONOSCOPY    ? COLONOSCOPY WITH PROPOFOL N/A 01/24/2021  ? Procedure: COLONOSCOPY WITH PROPOFOL;  Surgeon: Doran Stabler, MD;  [facility];  Service: Gastroenterology;  Laterality: N/A;  ? ESOPHAGOGASTRODUODENOSCOPY (EGD) WITH PROPOFOL N/A 01/24/2021  ? Procedure: ESOPHAGOGASTRODUODENOSCOPY (EGD) WITH PROPOFOL;  Surgeon: Nelida Meuse III,  MD;  [facility];  Service: Gastroenterology;  Laterality: N/A;  ? EYE SURGERY Bilateral   ? cataract removal  ? KNEE SURGERY    ? Left Knee  ? LAMINECTOMY WITH POSTERIOR LATERAL ARTHRODESIS LEVEL 1 N/A 11/29/2017  ? Procedure: Posterior Lateral Fusion - Lumbar Four-Lumbar Five, removal and replacement of hardware, Laminectomy - Lumbar Four-Lumbar Five;  Surgeon: Eustace Moore, MD;  Location: [facility] OR;  Service: Neurosurgery;  Laterality: N/A;  ? LAMINECTOMY WITH POSTERIOR LATERAL ARTHRODESIS LEVEL 2 N/A 06/07/2017  ? Procedure: Posterior Lateral Fusion Lumbar Three-Four and Transforaminal Interbody Fusion Lumbar Four-Five with Segmental  Pedicle Screw Fixation;  Surgeon: Eustace Moore, MD;  Location: [place];  Service: Neurosurgery;  Laterality: N/A;  Posterior Lateral Fusion Lumbar Three-Four and Transforaminal Interbody Fusion Lumbar Four-Five with Segmental  Pedicle Screw Fixation   ? LUMBAR FUSION  06/07/2017  ? POST  ? LUMBAR LAMINECTOMY/DECOMPRESSION MICRODISCECTOMY Bilateral 01/27/2016  ? Procedure: Laminectomy and Foraminotomy - Lumbar four -Lumbar five - bilateral- on-lay noninstrumented fusion;  Surgeon: Eustace Moore, MD;  Location: [place] [facility];  Service:  Neurosurgery;  Laterality: Bilateral;  ? MULTIPLE EXTRACTIONS WITH ALVEOLOPLASTY N/A 05/11/2016  ? Procedure: EXTRACTION OF TEETH EIGHTEEN, TWENTY AND TWENTY- NINE;  REMOVAL OF MANDIBULAR TORUS AND EXOSTOSIS;  Surgeon: Nicki Reaper

## 2021-10-03 NOTE — Patient Instructions (Signed)
Please make an appointment with your primary care, I have asked her to send me records.  I am not sure why you are on Ingrezza and how long you have been on it. ?Your exam shows evidence of Parkinson's-like changes but these could be from prior medication exposure or current medication exposure. ?If you could come off the Ingrezza and reduce some of your other sedating medications, we can reevaluate you for Parkinson's disease in the near future.  Please make an appointment in about 3 months and make an appointment with your primary care as well so you can discuss coming off the [place]. ?

## 2021-10-11 ENCOUNTER — Ambulatory Visit: Payer: Medicare HMO

## 2021-10-11 DIAGNOSIS — M545 Low back pain, unspecified: Secondary | ICD-10-CM | POA: Diagnosis not present

## 2021-10-11 DIAGNOSIS — M6281 Muscle weakness (generalized): Secondary | ICD-10-CM

## 2021-10-11 NOTE — Therapy (Signed)
?OUTPATIENT PHYSICAL THERAPY TREATMENT NOTE ? ? ?Patient Name: Kelsey Wilson ?MRN: [id number] ?DOB:April 10, 1961, 61 y.o., female ?Today's Date: 10/11/2021 ? ?PCP: Vonna Drafts, FNP ?REFERRING PROVIDER: Eleonore Chiquito* ? ?END OF SESSION:  ? PT End of Session - 10/11/21 1128   ? ? Visit Number 4   ? Number of Visits 6   ? Date for PT Re-Evaluation 10/18/21   ? Authorization Type Aetna MCR   ? Progress Note Due on Visit 6   ? PT Start Time 1130   ? PT Stop Time 1210   ? PT Time Calculation (min) 40 min   ? Activity Tolerance Patient tolerated treatment well   ? Behavior During Therapy [facility] for tasks assessed/performed   ? ?  ?  ? ?  ? ? ?Past Medical History:  ?Diagnosis Date  ? Anemia   ? Anxiety   ? Arthritis   ? Breast cancer ([place]) 2010  ? T3N1 invasive ductal carcinoma left breast.Takes Arimidex daily  ? Bursitis   ? Carpal tunnel syndrome   ? Chronic back pain   ? stenosis  ? Constipation   ? takes Colace daily  ? Depression   ? takes Benzotropine daily  ? Diverticulitis of colon   ? Dyspnea   ? daily when walking for over 1 yr.  ? Fibromyalgia 08/2012  ? GERD (gastroesophageal reflux disease)   ? takes Dexilant daily  ? Hemorrhoid   ? History of blood transfusion   ? no abnormal reaction noted  ? History of colon polyps   ? benign  ? History of shingles   ? Joint pain   ? Joint swelling   ? Morbid obesity ([place])   ? Night muscle spasms   ? takes Flexeril nightly as needed  ? Nocturia   ? OSA (obstructive sleep apnea)   ? OSA on CPAP   ? Peripheral edema   ? takes Furosemide.Just started 01/18/16  ? Peripheral neuropathy   ? takes Lyrica daily  ? Personal history of chemotherapy   ? 2013  ? Personal history of radiation therapy   ? 2013  ? Pneumonia   ? hx of-2015  ? Pseudoarthrosis of lumbar spine   ? Seasonal allergies   ? takes Singulair nightly  ? SOB (shortness of breath) on exertion   ? rarely with exertion  ? Splenorenal shunt malfunction ([place])   ? 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 Alphonzo Grieve)  ? Type II diabetes mellitus ([place])   ? ?Past Surgical History:  ?Procedure Laterality Date  ? ABDOMINAL HYSTERECTOMY    ? still has ovaries  ? APPENDECTOMY    ? AXILLARY LYMPH NODE DISSECTION  11/28/2011  ? Procedure: AXILLARY LYMPH NODE DISSECTION;  Surgeon: Edward Jolly, MD;  Location: [place];  Service: General;  Laterality: Left;  ? BREAST LUMPECTOMY Left 11/28/2011  ? Malignant  ? BREAST SURGERY Left 2013  ? CARPAL TUNNEL RELEASE    ? Bilateral  ? CESAREAN SECTION    ? pt. has had 3  ? CHOLECYSTECTOMY    ? COLONOSCOPY    ? COLONOSCOPY WITH PROPOFOL N/A 01/24/2021  ? Procedure: COLONOSCOPY WITH PROPOFOL;  Surgeon: Doran Stabler, MD;  [facility]: [facility];  Service: Gastroenterology;  Laterality: N/A;  ? ESOPHAGOGASTRODUODENOSCOPY (EGD) WITH PROPOFOL N/A 01/24/2021  ? Procedure: ESOPHAGOGASTRODUODENOSCOPY (EGD) WITH PROPOFOL;  Surgeon: Doran Stabler, MD;  [facility];  Service:  Gastroenterology;  Laterality: N/A;  ? EYE SURGERY Bilateral   ? cataract removal  ? KNEE SURGERY    ? Left Knee  ? LAMINECTOMY WITH POSTERIOR LATERAL ARTHRODESIS LEVEL 1 N/A 11/29/2017  ? Procedure: Posterior Lateral Fusion - Lumbar Four-Lumbar Five, removal and replacement of hardware, Laminectomy - Lumbar Four-Lumbar Five;  Surgeon: Eustace Moore, MD;  Location: [facility] OR;  Service: Neurosurgery;  Laterality: N/A;  ? LAMINECTOMY WITH POSTERIOR LATERAL ARTHRODESIS LEVEL 2 N/A 06/07/2017  ? Procedure: Posterior Lateral Fusion Lumbar Three-Four and Transforaminal Interbody Fusion Lumbar Four-Five with Segmental  Pedicle Screw Fixation;  Surgeon: Eustace Moore, MD;  Location: [place];  Service: Neurosurgery;  Laterality: N/A;  Posterior Lateral Fusion Lumbar Three-Four and Transforaminal Interbody Fusion Lumbar Four-Five with Segmental  Pedicle Screw Fixation   ? LUMBAR FUSION  06/07/2017  ? POST  ? LUMBAR LAMINECTOMY/DECOMPRESSION  MICRODISCECTOMY Bilateral 01/27/2016  ? Procedure: Laminectomy and Foraminotomy - Lumbar four -Lumbar five - bilateral- on-lay noninstrumented fusion;  Surgeon: Eustace Moore, MD;  Location: [place] [facility];  Service: Neurosurgery;  Laterality: Bilateral;  ? MULTIPLE EXTRACTIONS WITH ALVEOLOPLASTY N/A 05/11/2016  ? Procedure: EXTRACTION OF TEETH EIGHTEEN, TWENTY AND TWENTY- NINE;  REMOVAL OF MANDIBULAR TORUS AND EXOSTOSIS;  Surgeon: Diona Browner, DDS;  Location: [place];  Service: Oral Surgery;  Laterality: N/A;  ? POLYPECTOMY  01/24/2021  ? Procedure: POLYPECTOMY;  Surgeon: Doran Stabler, MD;  Location: [facility];  Service: Gastroenterology;;  ? PORTACATH PLACEMENT  06/18/2011  ? Procedure: INSERTION PORT-A-CATH;  Surgeon: Edward Jolly, MD;  Location: [place];  Service: General;  Laterality: Right;  right subclavian  ? removal portacath  2014  ? spur    ? Apex spur on both big toes  ? TOE SURGERY Bilateral   ? TONSILLECTOMY    ? TOTAL KNEE ARTHROPLASTY Left 09/13/2014  ? TOTAL KNEE ARTHROPLASTY Left 09/13/2014  ? Procedure: LEFT TOTAL KNEE ARTHROPLASTY;  Surgeon: Rod Can, MD;  Location: [place];  Service: Orthopedics;  Laterality: Left;  ? TOTAL KNEE ARTHROPLASTY Right 03/10/2015  ? Procedure: RIGHT TOTAL KNEE ARTHROPLASTY;  Surgeon: Rod Can, MD;  [facility];  Service: Orthopedics;  Laterality: Right;  ? ?Patient Active Problem List  ? Diagnosis Date Noted  ? Morbid obesity ([place])   ? Type II diabetes mellitus ([place])   ? GERD (gastroesophageal reflux disease) 06/21/2019  ? Insomnia 03/03/2019  ? Ankle arthritis 12/12/2018  ? Muscle weakness 12/12/2018  ? Obstructive sleep apnea 10/23/2018  ? Asthmatic bronchitis 10/23/2018  ? Cellulitis of internal cheek, left 01/23/2018  ? Lung nodule 09/15/2017  ? Morbid obesity with BMI of 45.0-49.9, adult ([place]) 09/05/2017  ? Arthritis of carpometacarpal (CMC) joints of both thumbs 03/14/2017  ? Spinal stenosis, lumbar region, with  neurogenic claudication 03/07/2017  ? Vertigo 10/12/2016  ? Peripheral positional vertigo 08/28/2016  ? Abnormal auditory perception of both ears 07/27/2016  ? Neuropathic pain 06/25/2016  ? 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 S/P lumbar spinal fusion 01/27/2016  ? H/O therapeutic radiation 09/21/2015  ? Personal history of breast cancer 09/21/2015  ? Pes planus of both feet 08/04/2015  ? Primary osteoarthritis of right knee 03/10/2015  ? Osteoarthritis of right knee 02/04/2015  ? Primary osteoarthritis of left knee 09/13/2014  ? Hot flashes 01/19/2014  ? Abdominal pain, unspecified site 01/19/2014  ? Malignant neoplasm of upper-outer quadrant of left breast in female, estrogen receptor positive ([place]) 03/19/2013  ? Lumbosacral spondylosis without myelopathy 12/30/2012  ?  Fibromyalgia syndrome 08/15/2012  ? Peripheral neuropathy, toxic 08/15/2012  ? Lymphedema of arm - left 04/30/2012  ? ? ?REFERRING DIAG: M54.16 (ICD-10-CM) - Radiculopathy, lumbar region  ? ?THERAPY DIAG:  ?Muscle weakness (generalized) ? ?Lumbosacral pain ? ?PERTINENT HISTORY: 1. Chronic Sacroiliac Joint Pain: S/P Bilateral Sacroilliac Injection with Dr. Letta Pate on 06/23/2019. With good relief noted. 08/22/2021. ?2.. Lumbar Postlaminectomy/ S/P Lumbar Fusion:Spinal Stenosis:S/P Posterior Lateral Fusion L-4-L-5 removal and replacement of hardware, Laminectomy L4-L5 by Dr. Ronnald Ramp on 05/22/2018. Continue HEP as Tolerated and Continue current medication regimen. 08/22/2021   ? ?PRECAUTIONS: Back ? ?SUBJECTIVE: No progress to report, pain region varies, today in L low back, 7/10 intensity.  Saw Dr. Ronnald Ramp and will be scheduled for MRI with possibility if future injections pending MRI results. ? ?PAIN:  ?Are you having pain? Yes: NPRS scale: 7/10 in L low back, no radicular symptoms ? ? ? ? ?  ?OBJECTIVE:  ?  ?DIAGNOSTIC FINDINGS:  ?None current ?  ?PATIENT SURVEYS:  ?FOTO 30 ?  ?SCREENING FOR RED FLAGS: ?Bowel or bladder incontinence: No ?   ?COGNITION: ?          Overall cognitive status: Within functional limits for tasks assessed               ?           ?SENSATION: ?WFL ?  ?MUSCLE LENGTH: ?Hamstrings: Right 40 deg; Left 40 deg ?Thomas test: UTA due to poor toleranc

## 2021-10-20 ENCOUNTER — Ambulatory Visit: Payer: Medicare HMO

## 2021-10-20 ENCOUNTER — Encounter: Payer: Self-pay | Admitting: *Deleted

## 2021-10-20 DIAGNOSIS — M545 Low back pain, unspecified: Secondary | ICD-10-CM

## 2021-10-20 DIAGNOSIS — M6281 Muscle weakness (generalized): Secondary | ICD-10-CM

## 2021-10-20 NOTE — Telephone Encounter (Signed)
Opened in error

## 2021-10-20 NOTE — Therapy (Signed)
OUTPATIENT PHYSICAL THERAPY TREATMENT NOTE   Patient Name: Kelsey Wilson MRN: [id number] DOB:Dec 19, 1960, 61 y.o., female Today's Date: 10/20/2021  PCP: Vonna Drafts, FNP REFERRING PROVIDER: Eleonore Chiquito*  END OF SESSION:   PT End of Session - 10/20/21 1048     Visit Number 5    Number of Visits 6    Date for PT Re-Evaluation 10/18/21    Authorization Type Aetna MCR    Progress Note Due on Visit 6    PT Start Time 1045    PT Stop Time 1125    PT Time Calculation (min) 40 min    Activity Tolerance Patient tolerated treatment well    Behavior During Therapy WFL for tasks assessed/performed             Past Medical History:  Diagnosis Date   Anemia    Anxiety    Arthritis    Breast cancer ([place]) 2010   T3N1 invasive ductal carcinoma left breast.Takes Arimidex daily   Bursitis    Carpal tunnel syndrome    Chronic back pain    stenosis   Constipation    takes Colace daily   Depression    takes Benzotropine daily   Diverticulitis of colon    Dyspnea    daily when walking for over 1 yr.   Fibromyalgia 08/2012   GERD (gastroesophageal reflux disease)    takes Dexilant daily   Hemorrhoid    History of blood transfusion    no abnormal reaction noted   History of colon polyps    benign   History of shingles    Joint pain    Joint swelling    Morbid obesity ([facility])    Night muscle spasms    takes Flexeril nightly as needed   Nocturia    OSA (obstructive sleep apnea)    OSA on CPAP    Peripheral edema    takes Furosemide.Just started 01/18/16   Peripheral neuropathy    takes Lyrica daily   Personal history of chemotherapy    2013   Personal history of radiation therapy    2013   Pneumonia    hx of-2015   Pseudoarthrosis of lumbar spine    Seasonal allergies    takes Singulair nightly   SOB (shortness of breath) on exertion    rarely with exertion   Splenorenal shunt malfunction ([facility])    stable splenorenal shunt with 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 Alphonzo Grieve)   Type II diabetes mellitus ([place])    Past Surgical History:  Procedure Laterality Date   ABDOMINAL HYSTERECTOMY     still has ovaries   APPENDECTOMY     AXILLARY LYMPH NODE DISSECTION  11/28/2011   Procedure: AXILLARY LYMPH NODE DISSECTION;  Surgeon: Edward Jolly, MD;  Location: [place];  Service: General;  Laterality: Left;   BREAST LUMPECTOMY Left 11/28/2011   Malignant   BREAST SURGERY Left 2013   CARPAL TUNNEL RELEASE     Bilateral   CESAREAN SECTION     pt. has had 3   CHOLECYSTECTOMY     COLONOSCOPY     COLONOSCOPY WITH PROPOFOL N/A 01/24/2021   Procedure: COLONOSCOPY WITH PROPOFOL;  Surgeon: Doran Stabler, MD;  [facility];  Service: Gastroenterology;  Laterality: N/A;   ESOPHAGOGASTRODUODENOSCOPY (EGD) WITH PROPOFOL N/A 01/24/2021   Procedure: ESOPHAGOGASTRODUODENOSCOPY (EGD) WITH PROPOFOL;  Surgeon: Doran Stabler, MD;  [facility];  Service:  Gastroenterology;  Laterality: N/A;   EYE SURGERY Bilateral    cataract removal   KNEE SURGERY     Left Knee   LAMINECTOMY WITH POSTERIOR LATERAL ARTHRODESIS LEVEL 1 N/A 11/29/2017   Procedure: Posterior Lateral Fusion - Lumbar Four-Lumbar Five, removal and replacement of hardware, Laminectomy - Lumbar Four-Lumbar Five;  Surgeon: Eustace Moore, MD;  Location: [facility];  Service: Neurosurgery;  Laterality: N/A;   LAMINECTOMY WITH POSTERIOR LATERAL ARTHRODESIS LEVEL 2 N/A 06/07/2017   Procedure: Posterior Lateral Fusion Lumbar Three-Four and Transforaminal Interbody Fusion Lumbar Four-Five with Segmental  Pedicle Screw Fixation;  Surgeon: Eustace Moore, MD;  Location: [place];  Service: Neurosurgery;  Laterality: N/A;  Posterior Lateral Fusion Lumbar Three-Four and Transforaminal Interbody Fusion Lumbar Four-Five with Segmental  Pedicle Screw Fixation    LUMBAR FUSION  06/07/2017   POST   LUMBAR LAMINECTOMY/DECOMPRESSION  MICRODISCECTOMY Bilateral 01/27/2016   Procedure: Laminectomy and Foraminotomy - Lumbar four -Lumbar five - bilateral- on-lay noninstrumented fusion;  Surgeon: Eustace Moore, MD;  Location: [place] [facility];  Service: Neurosurgery;  Laterality: Bilateral;   MULTIPLE EXTRACTIONS WITH ALVEOLOPLASTY N/A 05/11/2016   Procedure: EXTRACTION OF TEETH EIGHTEEN, TWENTY AND TWENTY- NINE;  REMOVAL OF MANDIBULAR TORUS AND EXOSTOSIS;  Surgeon: Diona Browner, DDS;  Location: [place];  Service: Oral Surgery;  Laterality: N/A;   POLYPECTOMY  01/24/2021   Procedure: POLYPECTOMY;  Surgeon: Doran Stabler, MD;  Location: [facility];  Service: Gastroenterology;;   PORTACATH PLACEMENT  06/18/2011   Procedure: INSERTION PORT-A-CATH;  Surgeon: Edward Jolly, MD;  Location: [place];  Service: General;  Laterality: Right;  right subclavian   removal portacath  2014   spur     Apex spur on both big toes   TOE SURGERY Bilateral    TONSILLECTOMY     TOTAL KNEE ARTHROPLASTY Left 09/13/2014   TOTAL KNEE ARTHROPLASTY Left 09/13/2014   Procedure: LEFT TOTAL KNEE ARTHROPLASTY;  Surgeon: Rod Can, MD;  Location: [place];  Service: Orthopedics;  Laterality: Left;   TOTAL KNEE ARTHROPLASTY Right 03/10/2015   Procedure: RIGHT TOTAL KNEE ARTHROPLASTY;  Surgeon: Rod Can, MD;  [facility];  Service: Orthopedics;  Laterality: Right;   Patient Active Problem List   Diagnosis Date Noted   Morbid obesity ([place])    Type II diabetes mellitus ([place])    GERD (gastroesophageal reflux disease) 06/21/2019   Insomnia 03/03/2019   Ankle arthritis 12/12/2018   Muscle weakness 12/12/2018   Obstructive sleep apnea 10/23/2018   Asthmatic bronchitis 10/23/2018   Cellulitis of internal cheek, left 01/23/2018   Lung nodule 09/15/2017   Morbid obesity with BMI of 45.0-49.9, adult ([place]) 09/05/2017   Arthritis of carpometacarpal (CMC) joints of both thumbs 03/14/2017   Spinal stenosis, lumbar region, with  neurogenic claudication 03/07/2017   Vertigo 10/12/2016   Peripheral positional vertigo 08/28/2016   Abnormal auditory perception of both ears 07/27/2016   Neuropathic pain 06/25/2016   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   S/P lumbar spinal fusion 01/27/2016   H/O therapeutic radiation 09/21/2015   Personal history of breast cancer 09/21/2015   Pes planus of both feet 08/04/2015   Primary osteoarthritis of right knee 03/10/2015   Osteoarthritis of right knee 02/04/2015   Primary osteoarthritis of left knee 09/13/2014   Hot flashes 01/19/2014   Abdominal pain, unspecified site 01/19/2014   Malignant neoplasm of upper-outer quadrant of left breast in female, estrogen receptor positive ([place]) 03/19/2013   Lumbosacral spondylosis without myelopathy 12/30/2012  Fibromyalgia syndrome 08/15/2012   Peripheral neuropathy, toxic 08/15/2012   Lymphedema of arm - left 04/30/2012    REFERRING DIAG: M54.16 (ICD-10-CM) - Radiculopathy, lumbar region   THERAPY DIAG:  Muscle weakness (generalized)  Lumbosacral pain  PERTINENT HISTORY: 1. Chronic Sacroiliac Joint Pain: S/P Bilateral Sacroilliac Injection with Dr. Letta Pate on 06/23/2019. With good relief noted. 08/22/2021. 2.. Lumbar Postlaminectomy/ S/P Lumbar Fusion:Spinal Stenosis:S/P Posterior Lateral Fusion L-4-L-5 removal and replacement of hardware, Laminectomy L4-L5 by Dr. Ronnald Ramp on 05/22/2018. Continue HEP as Tolerated and Continue current medication regimen. 08/22/2021    PRECAUTIONS: Back  SUBJECTIVE: Does not acknowledge any change in symptoms.  Still awaiting MRI appointment.  Does not feel any worse following PT sessions  PAIN:  Are you having pain? Yes: NPRS scale: 7/10 in L low back, no radicular symptoms       OBJECTIVE:    DIAGNOSTIC FINDINGS:  None current   PATIENT SURVEYS:  FOTO 30   SCREENING FOR RED FLAGS: Bowel or bladder incontinence: No   COGNITION:           Overall cognitive status: Within functional  limits for tasks assessed                          SENSATION: WFL   MUSCLE LENGTH: Hamstrings: Right 40 deg; Left 40 deg Thomas test: UTA due to poor tolerance of supine position   POSTURE:  R lateral lean, forward flexed at hips with lumbar extension   PALPATION: Inconclusive due to body habitus   LUMBAR ROM:    Active  A/PROM  09/06/2021  Flexion 25% to 50%(stands in 25% flexion)  Extension 0%  Right lateral flexion NT  Left lateral flexion NT  Right rotation    Left rotation     (ROM limited by pain)   LE ROM:   Passive  Right 09/06/2021 Left 09/06/2021  Hip flexion 45d(90d in seated) 100d  Hip extension      Hip abduction      Hip adduction      Hip internal rotation      Hip external rotation      Knee flexion      Knee extension      Ankle dorsiflexion      Ankle plantarflexion      Ankle inversion      Ankle eversion       (PROM limited by pain))   LE MMT:   MMT Right 09/06/2021 Left 09/06/2021  Hip flexion 3+ 3+  Hip extension 3+ 3+  Hip abduction 3+ 3+  Hip adduction      Hip internal rotation      Hip external rotation      Knee flexion 3+ 3+  Knee extension 3+ 3+  Ankle dorsiflexion      Ankle plantarflexion      Ankle inversion      Ankle eversion       (Blank rows = not tested)   LUMBAR SPECIAL TESTS:  UTA due to pain, intolerance of testing positions and inconclusive responses, slump test negative in sitting, nerve tension signs negative in sitting   FUNCTIONAL TESTS:  30s STS 6 reps   GAIT: Distance walked: [id number] Assistive device utilized: None Level of assistance: Modified independence Comments: furniture/wall surfs, lateral lean        TODAY'S TREATMENT  [facility] Adult PT Treatment:  DATE: 10/20/21 Therapeutic Exercise: Seated LAQs 20/20 Nustep L2 8 min Seated hamstring curl RTB 15/15 Seated abduction RTB 15x Standing march 10/10 SL clams 15/15 Supine LTR 10/10 Standing heel  raises 10x2 Standing toe raises 10x2 Supine marching 10/10  [facility] Adult PT Treatment:                                                DATE: 10/11/21 Therapeutic Exercise: Seated LAQs 20/20 Nustep L1 8 min Seated hamstring curl YTB 20/20 Seated abduction YTB 20x Standing march 10/10 SL clams 15/15 Supine LTR 10/10 Standing heel raises 10x2 Standing toe raises 10x2 Supine marching 10/10  [facility] Adult PT Treatment:                                                DATE: 10/02/21 Therapeutic Exercise: Seated LAQs 15/15 Nustep L1 4 min(unable to extend time due to somnolence) Seated hamstring curl YTB 15/15 Supine heel slides on towel, 15/15 Supine march 15/15 SL clams 15/15 Supine LTR 10/10 Supine hip fallouts YTB 15x Standing heel raises 15x Standing toe raises 15x      PATIENT EDUCATION:  Education details: Discussed eval findings, rehab rationale and POC and patient is in agreement  Person educated: Patient Education method: Explanation, Demonstration, and Handouts Education comprehension: verbalized understanding, returned demonstration, and needs further education     HOME EXERCISE PROGRAM: Access Code: [email or web address] URL: [email or web address] Date: 09/06/2021 Prepared by: Sharlynn Oliphant   Exercises - Sit to Stand with Armchair  - 2 x daily - 7 x weekly - 1 sets - 5 reps - Heel Toe Raises with Counter Support  - 2 x daily - 7 x weekly - 1 sets - 10 reps - Seated Long Arc Quad  - 2 x daily - 7 x weekly - 1 sets - 10 reps   ASSESSMENT:   CLINICAL IMPRESSION: Minimal progress reported by patient, she continues to demonstrate slow guarded motions, what appears to be delayed processing of motor commands but no antalgic gait/movement patterns observed.  Patient able to complete all tasks w/o increased symptoms however no pain relief acknowledged.  Advanced to RTB as well as increased reps as noted.  Initiated STS w/OH reach to facilitate posture and spinal extensor  activation.      OBJECTIVE IMPAIRMENTS Abnormal gait, decreased activity tolerance, decreased balance, decreased endurance, decreased knowledge of condition, decreased mobility, difficulty walking, decreased ROM, decreased strength, increased muscle spasms, impaired flexibility, improper body mechanics, postural dysfunction, obesity, and pain.    ACTIVITY LIMITATIONS cleaning, community activity, driving, meal prep, laundry, and shopping.    PERSONAL FACTORS Age, Fitness, Time since onset of injury/illness/exacerbation, and 3+ comorbidities: previous back surgeries, morbid obesity and DM  are also affecting patient's functional outcome.      REHAB POTENTIAL: Fair based on chronic nature and needed opioid pain control    CLINICAL DECISION MAKING: Stable/uncomplicated   EVALUATION COMPLEXITY: Low     GOALS: Goals reviewed with patient? Yes   SHORT TERM GOALS: Target date: 09/20/2021   Patient to demonstrate independence in HEP  Baseline:[id number] Goal status: Met   2.  Increase PROM of R hip flexion to 100d Baseline: 45d in supine limited by pain; 10/11/21 PROM B  hip flexion 100d Goal status: Met       LONG TERM GOALS: Target date: 10/04/2021   Decrease worst pain to 8/10 Baseline: 10/10; 10/11/21 7/10 pain rating over past several days Goal status: Met   2.  60d B SLR Baseline: 40d limited by pain and muscle tightness Goal status: INITIAL   3.  Patient able to stand in neutral F/E Baseline: stands in ~25% flexion Goal status: INITIAL   4.  Increase reps to 7 on 30s STS test Baseline: 6 reps  Goal status: INITIAL   5. Increase FOTO score to 40            Baseline: 30            Goal status: INITIAL       PLAN: PT FREQUENCY: 1x/week   PT DURATION: 6 weeks   PLANNED INTERVENTIONS: Therapeutic exercises, Therapeutic activity, Neuromuscular re-education, Balance training, Gait training, Patient/Family education, Joint mobilization, Stair training, Aquatic Therapy,  and Manual therapy.   PLAN FOR NEXT SESSION: Assess progress towards goals and anticipate DC due to limited progress    Lanice Shirts, PT 10/20/2021, 10:48 AM

## 2021-10-24 ENCOUNTER — Telehealth: Payer: Self-pay

## 2021-10-24 MED ORDER — CYCLOBENZAPRINE HCL 5 MG PO TABS: ORAL_TABLET | ORAL | 3 refills | Status: DC

## 2021-10-24 NOTE — Telephone Encounter (Signed)
Refill request for Cyclobenzaprine 

## 2021-10-25 ENCOUNTER — Ambulatory Visit: Payer: Medicare HMO

## 2021-10-25 DIAGNOSIS — M6281 Muscle weakness (generalized): Secondary | ICD-10-CM

## 2021-10-25 DIAGNOSIS — M545 Low back pain, unspecified: Secondary | ICD-10-CM | POA: Diagnosis not present

## 2021-10-25 NOTE — Therapy (Signed)
OUTPATIENT PHYSICAL THERAPY TREATMENT NOTE/DISCHARGE  SUMMARY   Patient Name: Kelsey Wilson MRN: [id number] DOB:November 21, 1960, 61 y.o., female Today's Date: 10/25/2021  PCP: Vonna Drafts, FNP REFERRING PROVIDER: Vonna Drafts, FNP  PHYSICAL THERAPY DISCHARGE SUMMARY  Visits from Start of Care: 6  Current functional level related to goals / functional outcomes: Goals partially met/ maximum benefit reached   Remaining deficits: Weakness and pain   Education / Equipment: HEP   Patient agrees to discharge. Patient goals were partially met. Patient is being discharged due to maximized rehab potential.    END OF SESSION:   PT End of Session - 10/25/21 1316     Visit Number 6    Number of Visits 6    Date for PT Re-Evaluation 10/18/21    Authorization Type [employer] MCR    Progress Note Due on Visit 6    PT Start Time 1315    PT Stop Time 1355    PT Time Calculation (min) 40 min    Activity Tolerance Patient tolerated treatment well    Behavior During Therapy WFL for tasks assessed/performed              Past Medical History:  Diagnosis Date   Anemia    Anxiety    Arthritis    Breast cancer ([place]) 2010   T3N1 invasive ductal carcinoma left breast.Takes Arimidex daily   Bursitis    Carpal tunnel syndrome    Chronic back pain    stenosis   Constipation    takes Colace daily   Depression    takes Benzotropine daily   Diverticulitis of colon    Dyspnea    daily when walking for over 1 yr.   Fibromyalgia 08/2012   GERD (gastroesophageal reflux disease)    takes Dexilant daily   Hemorrhoid    History of blood transfusion    no abnormal reaction noted   History of colon polyps    benign   History of shingles    Joint pain    Joint swelling    Morbid obesity (HCC)    Night muscle spasms    takes Flexeril nightly as needed   Nocturia    OSA (obstructive sleep apnea)    OSA on CPAP    Peripheral edema    takes Furosemide.Just started 01/18/16    Peripheral neuropathy    takes Lyrica daily   Personal history of chemotherapy    2013   Personal history of radiation therapy    2013   Pneumonia    hx of-2015   Pseudoarthrosis of lumbar spine    Seasonal allergies    takes Singulair nightly   SOB (shortness of breath) on exertion    rarely with exertion   Splenorenal shunt malfunction (HCC)    stable splenorenal shunt 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 Alphonzo Grieve)   Type II diabetes mellitus ([place])    Past Surgical History:  Procedure Laterality Date   ABDOMINAL HYSTERECTOMY     still has ovaries   APPENDECTOMY     AXILLARY LYMPH NODE DISSECTION  11/28/2011   Procedure: AXILLARY LYMPH NODE DISSECTION;  Surgeon: Edward Jolly, MD;  Location: [facility];  Service: General;  Laterality: Left;   BREAST LUMPECTOMY Left 11/28/2011   Malignant   BREAST SURGERY Left 2013   CARPAL TUNNEL RELEASE     Bilateral   CESAREAN SECTION     pt. has had 3  CHOLECYSTECTOMY     COLONOSCOPY     COLONOSCOPY WITH PROPOFOL N/A 01/24/2021   Procedure: COLONOSCOPY WITH PROPOFOL;  Surgeon: Doran Stabler, MD;  Location: [facility];  Service: Gastroenterology;  Laterality: N/A;   ESOPHAGOGASTRODUODENOSCOPY (EGD) WITH PROPOFOL N/A 01/24/2021   Procedure: ESOPHAGOGASTRODUODENOSCOPY (EGD) WITH PROPOFOL;  Surgeon: Doran Stabler, MD;  Location: [facility];  Service: Gastroenterology;  Laterality: N/A;   EYE SURGERY Bilateral    cataract removal   KNEE SURGERY     Left Knee   LAMINECTOMY WITH POSTERIOR LATERAL ARTHRODESIS LEVEL 1 N/A 11/29/2017   Procedure: Posterior Lateral Fusion - Lumbar Four-Lumbar Five, removal and replacement of hardware, Laminectomy - Lumbar Four-Lumbar Five;  Surgeon: Eustace Moore, MD;  Location: [facility];  Service: Neurosurgery;  Laterality: N/A;   LAMINECTOMY WITH POSTERIOR LATERAL ARTHRODESIS LEVEL 2 N/A 06/07/2017   Procedure: Posterior Lateral Fusion Lumbar  Three-Four and Transforaminal Interbody Fusion Lumbar Four-Five with Segmental  Pedicle Screw Fixation;  Surgeon: Eustace Moore, MD;  Location: [place];  Service: Neurosurgery;  Laterality: N/A;  Posterior Lateral Fusion Lumbar Three-Four and Transforaminal Interbody Fusion Lumbar Four-Five with Segmental  Pedicle Screw Fixation    LUMBAR FUSION  06/07/2017   POST   LUMBAR LAMINECTOMY/DECOMPRESSION MICRODISCECTOMY Bilateral 01/27/2016   Procedure: Laminectomy and Foraminotomy - Lumbar four -Lumbar five - bilateral- on-lay noninstrumented fusion;  Surgeon: Eustace Moore, MD;  Location: [facility];  Service: Neurosurgery;  Laterality: Bilateral;   MULTIPLE EXTRACTIONS WITH ALVEOLOPLASTY N/A 05/11/2016   Procedure: EXTRACTION OF TEETH EIGHTEEN, TWENTY AND TWENTY- NINE;  REMOVAL OF MANDIBULAR TORUS AND EXOSTOSIS;  Surgeon: Diona Browner, DDS;  Location: [place];  Service: Oral Surgery;  Laterality: N/A;   POLYPECTOMY  01/24/2021   Procedure: POLYPECTOMY;  Surgeon: Doran Stabler, MD;  Location: [facility];  Service: Gastroenterology;;   PORTACATH PLACEMENT  06/18/2011   Procedure: INSERTION PORT-A-CATH;  Surgeon: Edward Jolly, MD;  Location: [facility];  Service: General;  Laterality: Right;  right subclavian   removal portacath  2014   spur     Apex spur on both big toes   TOE SURGERY Bilateral    TONSILLECTOMY     TOTAL KNEE ARTHROPLASTY Left 09/13/2014   TOTAL KNEE ARTHROPLASTY Left 09/13/2014   Procedure: LEFT TOTAL KNEE ARTHROPLASTY;  Surgeon: Rod Can, MD;  Location: [facility];  Service: Orthopedics;  Laterality: Left;   TOTAL KNEE ARTHROPLASTY Right 03/10/2015   Procedure: RIGHT TOTAL KNEE ARTHROPLASTY;  Surgeon: Rod Can, MD;  Location: [facility];  Service: Orthopedics;  Laterality: Right;   Patient Active Problem List   Diagnosis Date Noted   Morbid obesity ([place])    Type II diabetes mellitus ([place])    GERD (gastroesophageal reflux disease) 06/21/2019   Insomnia  03/03/2019   Ankle arthritis 12/12/2018   Muscle weakness 12/12/2018   Obstructive sleep apnea 10/23/2018   Asthmatic bronchitis 10/23/2018   Cellulitis of internal cheek, left 01/23/2018   Lung nodule 09/15/2017   Morbid obesity with BMI of 45.0-49.9, adult ([place]) 09/05/2017   Arthritis of carpometacarpal (CMC) joints of both thumbs 03/14/2017   Spinal stenosis, lumbar region, with neurogenic claudication 03/07/2017   Vertigo 10/12/2016   Peripheral positional vertigo 08/28/2016   Abnormal auditory perception of both ears 07/27/2016   Neuropathic pain 06/25/2016   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   S/P lumbar spinal fusion 01/27/2016   H/O therapeutic radiation 09/21/2015   Personal history of breast cancer 09/21/2015  Pes planus of both feet 08/04/2015   Primary osteoarthritis of right knee 03/10/2015   Osteoarthritis of right knee 02/04/2015   Primary osteoarthritis of left knee 09/13/2014   Hot flashes 01/19/2014   Abdominal pain, unspecified site 01/19/2014   Malignant neoplasm of upper-outer quadrant of left breast in female, estrogen receptor positive ([place]) 03/19/2013   Lumbosacral spondylosis without myelopathy 12/30/2012   Fibromyalgia syndrome 08/15/2012   Peripheral neuropathy, toxic 08/15/2012   Lymphedema of arm - left 04/30/2012    REFERRING DIAG: M54.16 (ICD-10-CM) - Radiculopathy, lumbar region   THERAPY DIAG:  Muscle weakness (generalized)  PERTINENT HISTORY: 1. Chronic Sacroiliac Joint Pain: S/P Bilateral Sacroilliac Injection with Dr. Letta Pate on 06/23/2019. With good relief noted. 08/22/2021. 2.. Lumbar Postlaminectomy/ S/P Lumbar Fusion:Spinal Stenosis:S/P Posterior Lateral Fusion L-4-L-5 removal and replacement of hardware, Laminectomy L4-L5 by Dr. Ronnald Ramp on 05/22/2018. Continue HEP as Tolerated and Continue current medication regimen. 08/22/2021    PRECAUTIONS: Back  SUBJECTIVE: Does not acknowledge any change in symptoms.  Still awaiting MRI  appointment.  Does not feel any worse following PT sessions.  Agrees to DC this date.  PAIN:  Are you having pain? Yes: NPRS scale: 7/10 in L low back, no radicular symptoms       OBJECTIVE:    DIAGNOSTIC FINDINGS:  None current   PATIENT SURVEYS:  FOTO 30   SCREENING FOR RED FLAGS: Bowel or bladder incontinence: No   COGNITION:           Overall cognitive status: Within functional limits for tasks assessed                          SENSATION: WFL   MUSCLE LENGTH: Hamstrings: Right 40 deg; Left 40 deg Thomas test: UTA due to poor tolerance of supine position   POSTURE:  R lateral lean, forward flexed at hips with lumbar extension   PALPATION: Inconclusive due to body habitus   LUMBAR ROM:    Active  A/PROM  09/06/2021  Flexion 25% to 50%(stands in 25% flexion)  Extension 0%  Right lateral flexion NT  Left lateral flexion NT  Right rotation    Left rotation     (ROM limited by pain)   LE ROM:   Passive  Right 09/06/2021 Left 09/06/2021  Hip flexion 45d(90d in seated) 100d  Hip extension      Hip abduction      Hip adduction      Hip internal rotation      Hip external rotation      Knee flexion      Knee extension      Ankle dorsiflexion      Ankle plantarflexion      Ankle inversion      Ankle eversion       (PROM limited by pain))   LE MMT:   MMT Right 09/06/2021 Left 09/06/2021  Hip flexion 3+ 3+  Hip extension 3+ 3+  Hip abduction 3+ 3+  Hip adduction      Hip internal rotation      Hip external rotation      Knee flexion 3+ 3+  Knee extension 3+ 3+  Ankle dorsiflexion      Ankle plantarflexion      Ankle inversion      Ankle eversion       (Blank rows = not tested)   LUMBAR SPECIAL TESTS:  UTA due to pain, intolerance of testing positions and inconclusive responses,  slump test negative in sitting, nerve tension signs negative in sitting   FUNCTIONAL TESTS:  30s STS 6 reps   GAIT: Distance walked: [id number] Assistive device utilized:  None Level of assistance: Modified independence Comments: furniture/wall surfs, lateral lean        TODAY'S TREATMENT  [facility] Adult PT Treatment:                                                DATE: 10/25/21 Therapeutic Exercise: Seated LAQs 20/20 Nustep L2 8 min Seated hamstring curl RTB 15/15 Seated abduction RTB 15x Standing march 15/15 SL clams 15/15 Supine LTR 10/10 Standing heel raises 10x2 Standing toe raises 10x2 Supine marching 15/15  [facility] Adult PT Treatment:                                                DATE: 10/20/21 Therapeutic Exercise: Seated LAQs 20/20 Nustep L2 8 min Seated hamstring curl RTB 15/15 Seated abduction RTB 15x Standing march 10/10 SL clams 15/15 Supine LTR 10/10 Standing heel raises 10x2 Standing toe raises 10x2 Supine marching 10/10  [facility] Adult PT Treatment:                                                DATE: 10/11/21 Therapeutic Exercise: Seated LAQs 20/20 Nustep L1 8 min Seated hamstring curl YTB 20/20 Seated abduction YTB 20x Standing march 10/10 SL clams 15/15 Supine LTR 10/10 Standing heel raises 10x2 Standing toe raises 10x2 Supine marching 10/10       PATIENT EDUCATION:  Education details: Discussed eval findings, rehab rationale and POC and patient is in agreement  Person educated: Patient Education method: Explanation, Demonstration, and Handouts Education comprehension: verbalized understanding, returned demonstration, and needs further education     HOME EXERCISE PROGRAM: Access Code: [email or web address]: [email or web address] Date: 09/06/2021 Prepared by: JSharlynn Oliphant  Exercises - Sit to Stand with Armchair  - 2 x daily - 7 x weekly - 1 sets - 5 reps - Heel Toe Raises with Counter Support  - 2 x daily - 7 x weekly - 1 sets - 10 reps - Seated Long Arc Quad  - 2 x daily - 7 x weekly - 1 sets - 10 reps   ASSESSMENT:   CLINICAL IMPRESSION: Today's session re-assessed progress towards goals with some  goals met or maximum function regained.  Continued restrictions in flexibility and strength noted.  Patient awaiting MRI approval and f/u with Dr. JRonnald Rampfor further interventions.      OBJECTIVE IMPAIRMENTS Abnormal gait, decreased activity tolerance, decreased balance, decreased endurance, decreased knowledge of condition, decreased mobility, difficulty walking, decreased ROM, decreased strength, increased muscle spasms, impaired flexibility, improper body mechanics, postural dysfunction, obesity, and pain.    ACTIVITY LIMITATIONS cleaning, community activity, driving, meal prep, laundry, and shopping.    PERSONAL FACTORS Age, Fitness, Time since onset of injury/illness/exacerbation, and 3+ comorbidities: previous back surgeries, morbid obesity and DM  are also affecting patient's functional outcome.      REHAB POTENTIAL: Fair based on chronic nature and needed opioid pain control  CLINICAL DECISION MAKING: Stable/uncomplicated   EVALUATION COMPLEXITY: Low     GOALS: Goals reviewed with patient? Yes   SHORT TERM GOALS: Target date: 09/20/2021   Patient to demonstrate independence in HEP  Baseline:[id number] Goal status: Met   2.  Increase PROM of R hip flexion to 100d Baseline: 45d in supine limited by pain; 10/11/21 PROM B hip flexion 100d Goal status: Met       LONG TERM GOALS: Target date: 10/04/2021   Decrease worst pain to 8/10 Baseline: 10/10; 10/11/21 7/10 pain rating over past several days Goal status: Met   2.  60d B SLR Baseline: 40d limited by pain and muscle tightness; 10/25/21 40d B due to pain in B calves Goal status: Not met   3.  Patient able to stand in neutral F/E Baseline: stands in ~25% flexion; 10/25/21 Continued forward flexed posture Goal status: not met   4.  Increase reps to 7 on 30s STS test Baseline: 6 reps; 10/26/18 8 stands Goal status: Goal met   5. Increase FOTO score to 40            Baseline: 30; 10/25/21 35            Goal status:  Partially met       PLAN: PT FREQUENCY: 1x/week   PT DURATION: 6 weeks   PLANNED INTERVENTIONS: Therapeutic exercises, Therapeutic activity, Neuromuscular re-education, Balance training, Gait training, Patient/Family education, Joint mobilization, Stair training, Aquatic Therapy, and Manual therapy.   PLAN FOR NEXT SESSION: DC due to limited progress    Lanice Shirts, PT 10/25/2021, 1:58 PM

## 2021-10-31 ENCOUNTER — Other Ambulatory Visit: Payer: Self-pay | Admitting: Student

## 2021-10-31 ENCOUNTER — Ambulatory Visit: Payer: Medicare HMO | Admitting: Neurology

## 2021-10-31 DIAGNOSIS — M5416 Radiculopathy, lumbar region: Secondary | ICD-10-CM

## 2021-11-03 ENCOUNTER — Ambulatory Visit: Payer: Medicare HMO

## 2021-11-07 DIAGNOSIS — I10 Essential (primary) hypertension: Secondary | ICD-10-CM | POA: Diagnosis not present

## 2021-11-07 DIAGNOSIS — Z6841 Body Mass Index (BMI) 40.0 and over, adult: Secondary | ICD-10-CM | POA: Diagnosis not present

## 2021-11-07 DIAGNOSIS — G250 Essential tremor: Secondary | ICD-10-CM | POA: Diagnosis not present

## 2021-11-07 DIAGNOSIS — E785 Hyperlipidemia, unspecified: Secondary | ICD-10-CM | POA: Diagnosis not present

## 2021-11-07 DIAGNOSIS — G4700 Insomnia, unspecified: Secondary | ICD-10-CM | POA: Diagnosis not present

## 2021-11-07 DIAGNOSIS — E1121 Type 2 diabetes mellitus with diabetic nephropathy: Secondary | ICD-10-CM | POA: Diagnosis not present

## 2021-11-07 DIAGNOSIS — M797 Fibromyalgia: Secondary | ICD-10-CM | POA: Diagnosis not present

## 2021-11-07 DIAGNOSIS — E1165 Type 2 diabetes mellitus with hyperglycemia: Secondary | ICD-10-CM | POA: Diagnosis not present

## 2021-11-07 DIAGNOSIS — J449 Chronic obstructive pulmonary disease, unspecified: Secondary | ICD-10-CM | POA: Diagnosis not present

## 2021-11-08 ENCOUNTER — Telehealth: Payer: Self-pay

## 2021-11-08 NOTE — Telephone Encounter (Signed)
CMN resubmitted

## 2021-11-18 ENCOUNTER — Ambulatory Visit
Admission: RE | Admit: 2021-11-18 | Discharge: 2021-11-18 | Disposition: A | Discharge status: Home / Self Care | Payer: Medicare HMO | Source: Ambulatory Visit | Attending: Student | Admitting: Student

## 2021-11-18 DIAGNOSIS — M5416 Radiculopathy, lumbar region: Secondary | ICD-10-CM

## 2021-11-18 DIAGNOSIS — Z853 Personal history of malignant neoplasm of breast: Secondary | ICD-10-CM | POA: Diagnosis not present

## 2021-11-18 DIAGNOSIS — R531 Weakness: Secondary | ICD-10-CM | POA: Diagnosis not present

## 2021-11-18 DIAGNOSIS — M5126 Other intervertebral disc displacement, lumbar region: Secondary | ICD-10-CM | POA: Diagnosis not present

## 2021-11-18 DIAGNOSIS — M4186 Other forms of scoliosis, lumbar region: Secondary | ICD-10-CM | POA: Diagnosis not present

## 2021-11-18 IMAGING — MR MR LUMBAR SPINE WO/W CM
4 of 8 series · 21 of 48 positions shown · IV contrast (multihance)
Comparison: 07/23/2015

CLINICAL DATA: No known injury, history of breast cancer, bilateral
leg weakness

EXAM:
MRI LUMBAR SPINE WITHOUT AND WITH CONTRAST
TECHNIQUE: Multiplanar and multiecho pulse sequences of the lumbar spine were
obtained without and with intravenous contrast.
CONTRAST:  20mL MULTIHANCE GADOBENATE DIMEGLUMINE 529 MG/ML IV SOLN

[Series 9: T1 · sagittal · 4.0mm · 0.73mm/px · 5 of 17 slices shown (1 of 2)]
[im 1/17]
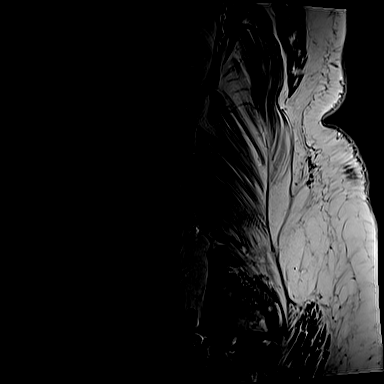
[im 5/17]
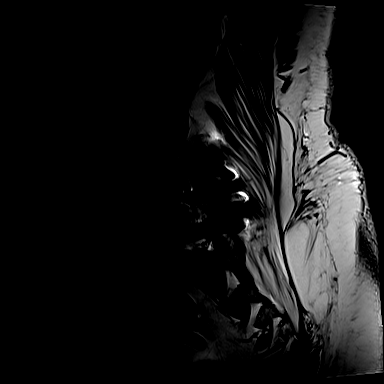
[im 9/17]
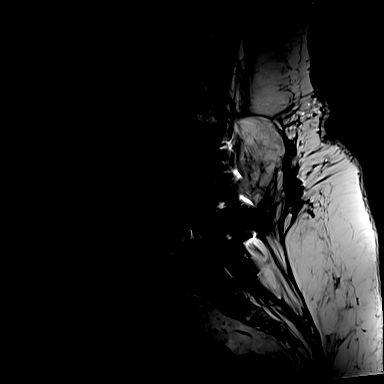
[im 13/17]
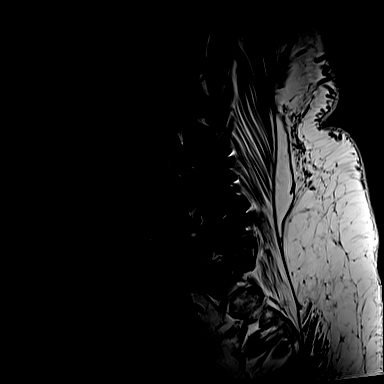
[im 17/17]
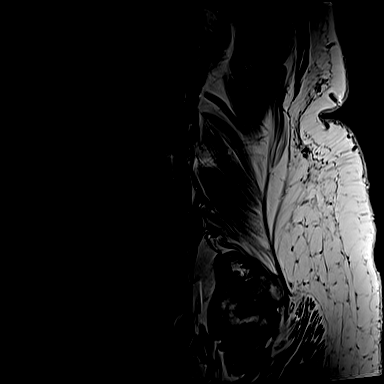

[Series 11: T1 · axial · 4.0mm · 0.35mm/px · z∈[+123,+213]mm · 4 of 19 slices shown (2 of 2)]
[im 1/19]
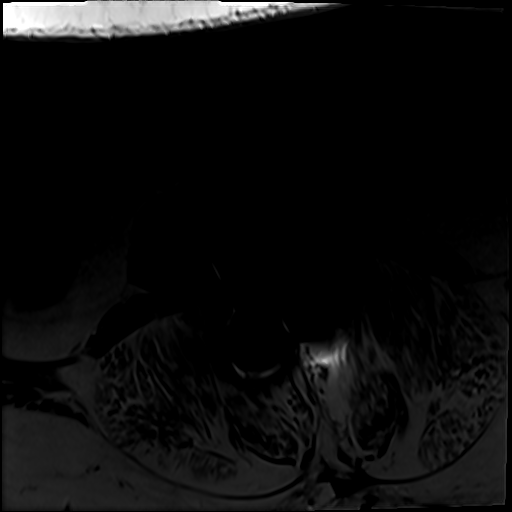
[im 5/19]
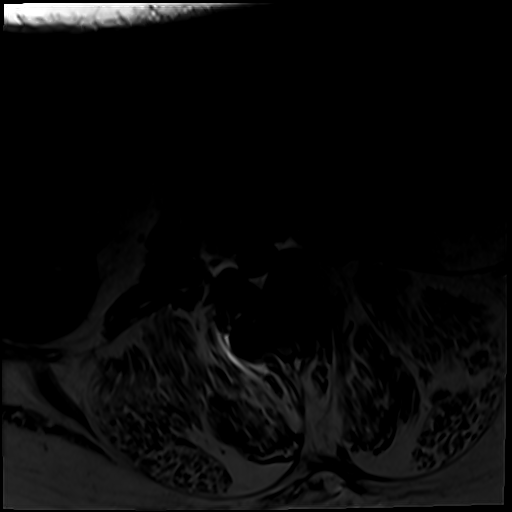
[im 10/19]
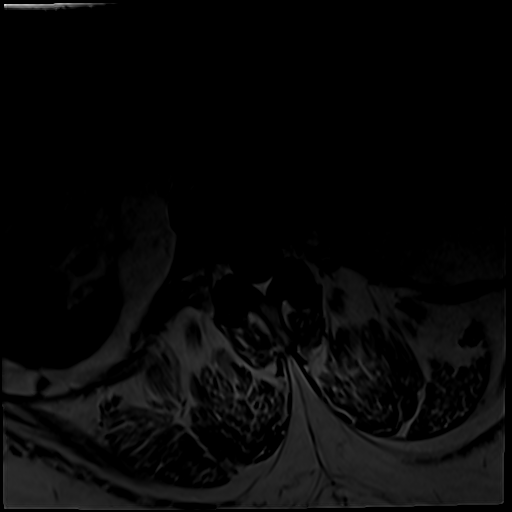
[im 19/19]
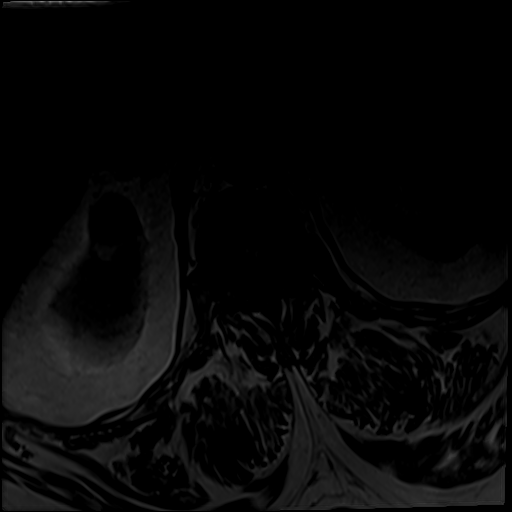

[Series 15: T2 · axial · 4.0mm · 0.28mm/px · z∈[+2,+213]mm · 8 of 39 slices shown (1 of 2)]
[im 1/39]
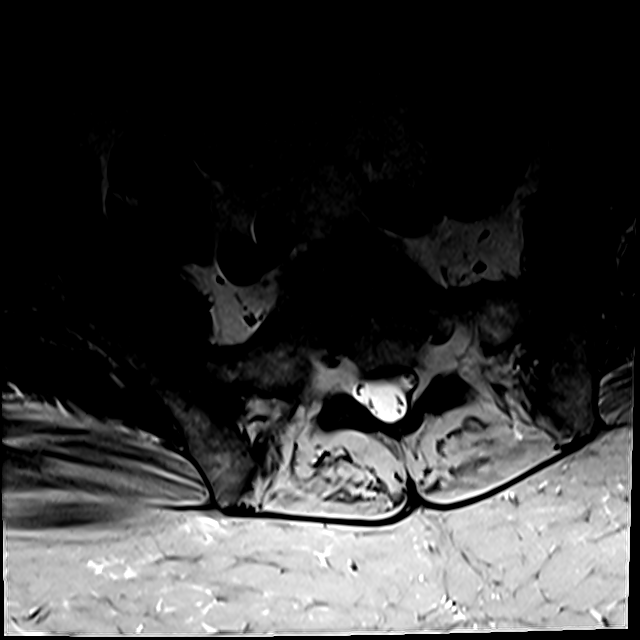
[im 5/39]
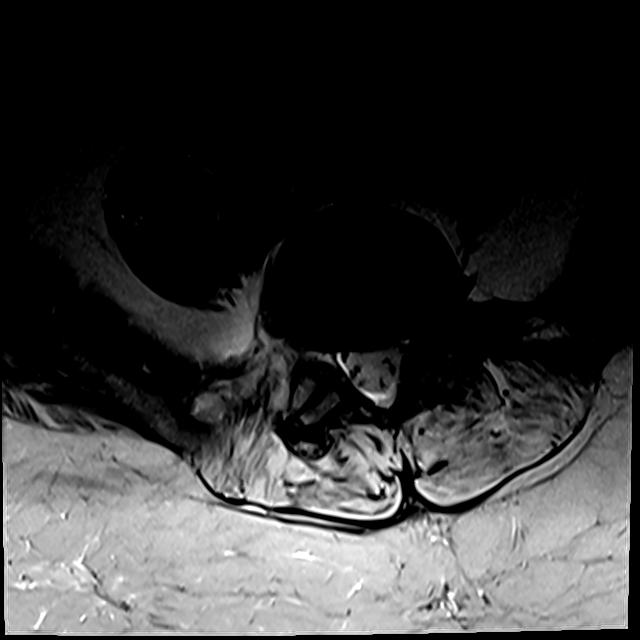
[im 13/39]
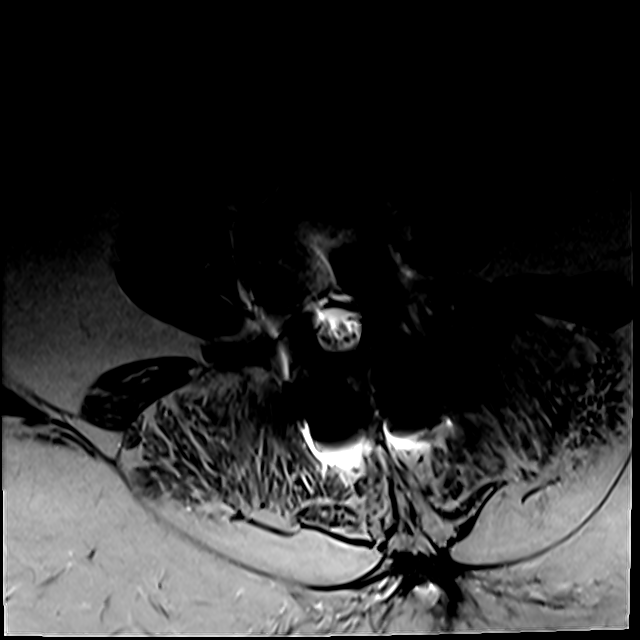
[im 17/39]
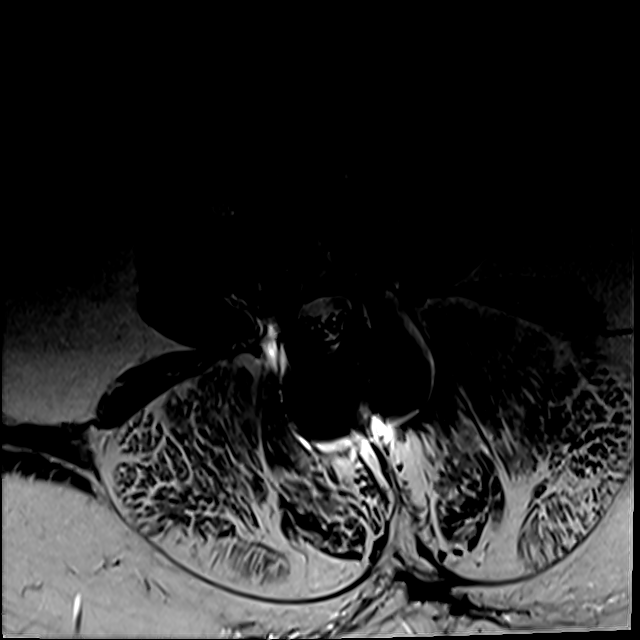
[im 22/39]
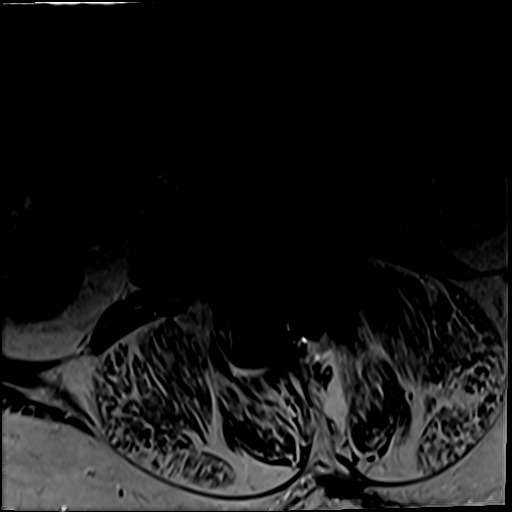
[im 26/39]
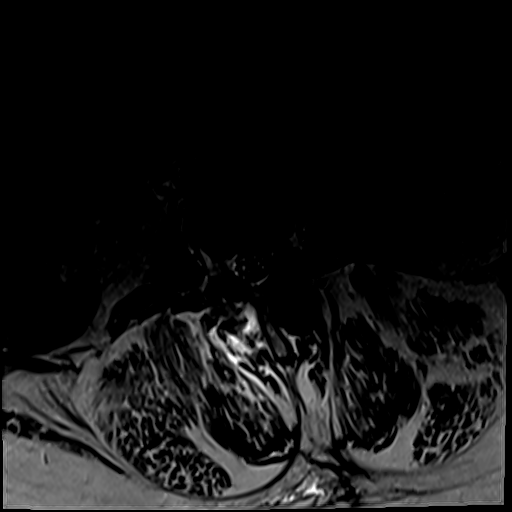
[im 34/39]
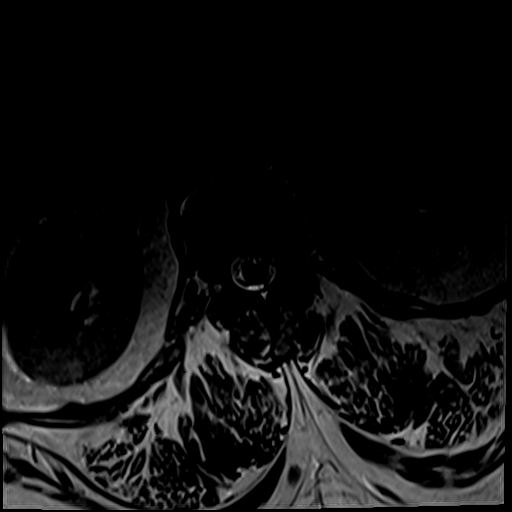
[im 39/39]
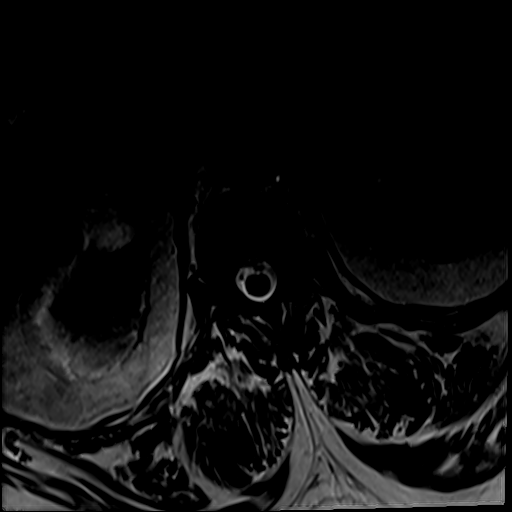

[Series 16: T2 · sagittal · 4.0mm · 0.73mm/px · 4 of 17 slices shown (2 of 2)]
[im 1/17]
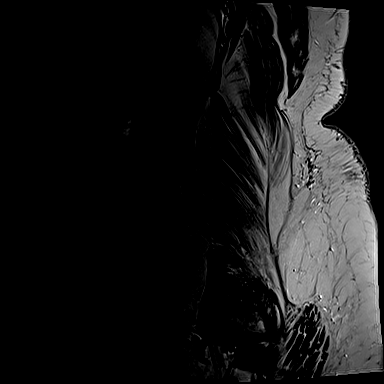
[im 6/17]
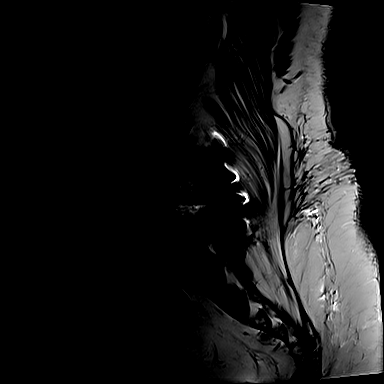
[im 11/17]
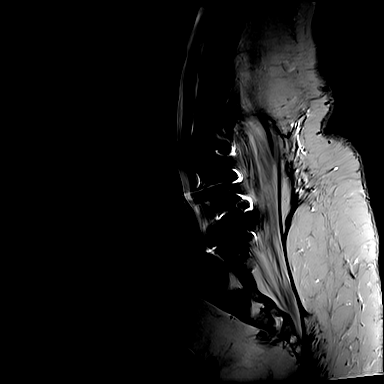
[im 17/17]
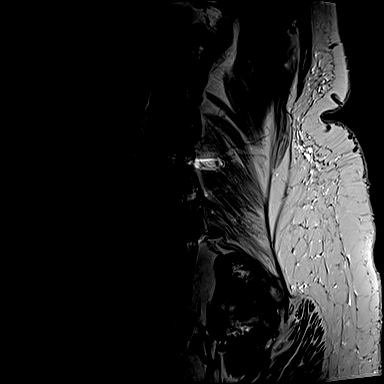

[21 of 48 positions shown; findings below may reference images not displayed]

FINDINGS: Segmentation:  Standard.

Alignment: Minimal grade 1 anterolisthesis of L5-S1. Dextroscoliosis
of the lumbar spine.

Vertebrae: No acute fracture, evidence of discitis, or aggressive
bone lesion.

Conus medullaris and cauda equina: Conus extends to the L1 level.
Conus and cauda equina appear normal.

Paraspinal and other soft tissues: No acute paraspinal abnormality.
Small right renal cyst partially visualized. Atrophy of the lower
paraspinal musculature.

Disc levels:

Disc spaces: Posterior lumbar interbody fusion from L3 through L5.

T12-L1: No disc protrusion. Moderate bilateral facet arthropathy. No
foraminal or central canal stenosis.

L1-L2: Mild broad-based disc bulge. Moderate bilateral facet
arthropathy. No foraminal or central canal stenosis.

L2-L3: No disc protrusion.  No foraminal or central canal stenosis.

L3-L4: Interbody fusion.  No foraminal or central canal stenosis.

L4-L5: No disc protrusion.  No foraminal or central canal stenosis.

L5-S1: No significant disc bulge. No neural foraminal stenosis. No
central canal stenosis.
IMPRESSION: 1. Posterior lumbar interbody fusion from L3 through L5. No
recurrent foraminal or central canal stenosis.
2. At L1-2 there is a mild broad-based disc bulge and moderate
bilateral facet arthropathy.
3. No acute osseous injury of the lumbar spine.

## 2021-11-18 MED ORDER — GADOBENATE DIMEGLUMINE 529 MG/ML IV SOLN
20.0000 mL | Freq: Once | INTRAVENOUS | Status: AC | PRN
  Administered 2021-11-18: 20 mL via INTRAVENOUS

## 2021-11-22 DIAGNOSIS — E1121 Type 2 diabetes mellitus with diabetic nephropathy: Secondary | ICD-10-CM | POA: Diagnosis not present

## 2021-12-15 ENCOUNTER — Ambulatory Visit: Payer: Medicare HMO | Admitting: Podiatry

## 2021-12-19 DIAGNOSIS — M961 Postlaminectomy syndrome, not elsewhere classified: Secondary | ICD-10-CM | POA: Diagnosis not present

## 2021-12-25 DIAGNOSIS — R1310 Dysphagia, unspecified: Secondary | ICD-10-CM | POA: Diagnosis not present

## 2021-12-26 ENCOUNTER — Ambulatory Visit (INDEPENDENT_AMBULATORY_CARE_PROVIDER_SITE_OTHER): Payer: Medicare HMO | Admitting: Neurology

## 2021-12-26 ENCOUNTER — Encounter: Payer: Self-pay | Admitting: Neurology

## 2021-12-26 VITALS — BP 116/79 | HR 87 | Ht 62.0 in | Wt 272.6 lb

## 2021-12-26 DIAGNOSIS — R259 Unspecified abnormal involuntary movements: Secondary | ICD-10-CM

## 2021-12-26 NOTE — Progress Notes (Signed)
Subjective:    Patient ID: Kelsey Wilson is a 61 y.o. female.  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  The patient is accompanied by her maternal aunt today.  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  She was on multiple psychotropic medications, including Ingrezza.  She was advised to follow-up with her primary care nurse practitioner and discuss coming off of some of her psychotropic medications if possible including discussion of why she was on Ingrezza.   Today, 12/26/2021: She reports being off of the Ingrezza since late May or early June.  She saw her primary care nurse practitioner yesterday and was advised to follow-up in this clinic, patient reports intermittent gagging and throwing up mucus.  She has had increase in involuntary tongue movements since coming off the [place].  She is not aware of any family history of involuntary movements or genetic diseases such as Huntington's disease, maternal aunt is also not aware of anybody else having involuntary movements in the family, patient saw neurologist last year and had a brain scan, she is not sure as to the name of the neurologist and does not wish to go back there.  She is currently not seeing a psychiatrist but has seen psychiatry before.  She reports  that her primary care is in the process of referring her to another psychiatrist.  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   Otherwise no significant abnormality.  She has chronic pain, she is on gabapentin.  She is not on narcotic pain medication currently.  She does not have a prescription for Narcan currently.  She reports being on Ingrezza for years.  Previously:   10/03/21: (She) reports a several month history of tremors affecting both upper extremities, face, feeling restless.  She presented to the emergency room at [facility] on 09/23/2021 with a chief complaint of tremors.  She reported trembling of her mouth and face as well as hands and numbness and tingling in her fingers.  Symptoms were ongoing for about a year.  Of note, she is on multiple medications including several psychotropic medications.  Specifically, she is on Xanax, Flexeril, Cymbalta, gabapentin, Ingrezza, temazepam, full list of medication as below. I reviewed the ER records.  She was noted to have an intention tremor on the left and facial trembling and masked facies.  She had a brain MRI with and without contrast on 09/29/2021 and I reviewed the results: IMPRESSION: 1. Mildly motion degraded. No metastatic disease or acute intracranial abnormality identified.   2. Stable MRI appearance of the brain since 2018. 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   Spine MRI with and without contrast on 09/29/2021 and I reviewed the results: IMPRESSION: 1. No evidence of metastatic disease or abnormal postcontrast  enhancement. 2. Left paracentral disc protrusion at C2-C3 resulting in mild impress upon the left hemicord and mild canal stenosis. 3. Mild left foraminal stenosis at C7-T1. Laboratory work-up showed normal TSH at 1.271 on 09/29/2021,  free T3 normal at 3.4, free T4 normal at 1.09, CK level normal at 101, CMP showed sodium of 136, potassium 3.9, glucose 130, BUN 20, creatinine 1.02, total bilirubin slightly elevated at 1.3.  CBC with differential with benign findings, no white cell count elevation, ammonia level less than 10.  INR 0.9.   She had seen Dr. Jannifer Franklin some 5 years ago for vertigo.   The patient has no family history of Parkinson's disease.  She is on Ingrezza for years, unclear who is the original prescriber for this.  According to her primary care, [name], NP, who was briefly on the phone with the patient, patient was on Ingrezza years ago for TD.  Unclear which medication actually caused the TD as current list of medication does not list any antipsychotic medication and the patient does not recall trying Haldol, Risperdal, Geodon or Abilify.  She is unable to provide a detailed history, she is unable to say who originally prescribed the Ingrezza, she is currently not followed by psychiatry and has not seen a psychiatrist in years but would like to see 1.  She is on gabapentin for neuropathy pain, she also takes Ultram, she has a prescription for her naloxone but is currently not on morphine or oxycodone or hydrocodone but is on a relatively high dose of tramadol.  She is also on a high dose of Robaxin.  She takes Cymbalta, this is prescribed by primary care and she also takes Xanax through primary care as understand.  She feels restless during the appointment and feels that she cannot sit still.  She gets up several times while go to the exam table and laying on the table.  She leans her elbows on the exam table, bending over.  She feels that it causes her to feel less restless in her body.  She has symptoms of akathisia.   She is single, she lives with her 2 grandsons, ages 52 and 91, the 27 year old is her younger son's child and the 77 year old is her older son stroke.  Patient has 2 sons and 1 daughter.  Her  Past Medical History Is Significant For: Past Medical History:  Diagnosis Date   Anemia    Anxiety    Arthritis    Breast cancer ([place]) 2010   T3N1 invasive ductal carcinoma left breast.Takes Arimidex daily   Bursitis    Carpal tunnel syndrome    Chronic back pain    stenosis   Constipation    takes Colace daily   Depression    takes Benzotropine daily   Diverticulitis of colon    Dyspnea    daily when walking for over 1 yr.   Fibromyalgia 08/2012   GERD (gastroesophageal reflux disease)    takes Dexilant daily   Hemorrhoid    History of blood transfusion    no abnormal reaction noted   History of colon polyps    benign   History of shingles    Joint pain    Joint swelling    Morbid obesity (HCC)    Night muscle spasms    takes Flexeril nightly as needed   Nocturia    OSA (obstructive sleep apnea)    OSA on CPAP    Peripheral edema    takes Furosemide.Just started  01/18/16   Peripheral neuropathy    takes Lyrica daily   Personal history of chemotherapy    2013   Personal history of radiation therapy    2013   Pneumonia    hx of-2015   Pseudoarthrosis of lumbar spine    Seasonal allergies    takes Singulair nightly   SOB (shortness of breath) on exertion    rarely with exertion   Splenorenal shunt malfunction (HCC)    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 Alphonzo Grieve)   Type II diabetes mellitus ([place])     Her Past Surgical History Is Significant For: Past Surgical History:  Procedure Laterality Date   ABDOMINAL HYSTERECTOMY     still has ovaries   APPENDECTOMY     AXILLARY LYMPH NODE DISSECTION  11/28/2011   Procedure: AXILLARY LYMPH NODE DISSECTION;  Surgeon: Edward Jolly, MD;  Location: [place];  Service: General;  Laterality: Left;   BREAST LUMPECTOMY Left 11/28/2011   Malignant   BREAST SURGERY Left 2013   CARPAL TUNNEL RELEASE     Bilateral   CESAREAN SECTION     pt.  has had 3   CHOLECYSTECTOMY     COLONOSCOPY     COLONOSCOPY WITH PROPOFOL N/A 01/24/2021   Procedure: COLONOSCOPY WITH PROPOFOL;  Surgeon: Doran Stabler, MD;  Location: [facility];  Service: Gastroenterology;  Laterality: N/A;   ESOPHAGOGASTRODUODENOSCOPY (EGD) WITH PROPOFOL N/A 01/24/2021   Procedure: ESOPHAGOGASTRODUODENOSCOPY (EGD) WITH PROPOFOL;  Surgeon: Doran Stabler, MD;  Location: [facility];  Service: Gastroenterology;  Laterality: N/A;   EYE SURGERY Bilateral    cataract removal   KNEE SURGERY     Left Knee   LAMINECTOMY WITH POSTERIOR LATERAL ARTHRODESIS LEVEL 1 N/A 11/29/2017   Procedure: Posterior Lateral Fusion - Lumbar Four-Lumbar Five, removal and replacement of hardware, Laminectomy - Lumbar Four-Lumbar Five;  Surgeon: Eustace Moore, MD;  Location: [facility] OR;  Service: Neurosurgery;  Laterality: N/A;   LAMINECTOMY WITH POSTERIOR LATERAL ARTHRODESIS LEVEL 2 N/A 06/07/2017   Procedure: Posterior Lateral Fusion Lumbar Three-Four and Transforaminal Interbody Fusion Lumbar Four-Five with Segmental  Pedicle Screw Fixation;  Surgeon: Eustace Moore, MD;  Location: [place];  Service: Neurosurgery;  Laterality: N/A;  Posterior Lateral Fusion Lumbar Three-Four and Transforaminal Interbody Fusion Lumbar Four-Five with Segmental  Pedicle Screw Fixation    LUMBAR FUSION  06/07/2017   POST   LUMBAR LAMINECTOMY/DECOMPRESSION MICRODISCECTOMY Bilateral 01/27/2016   Procedure: Laminectomy and Foraminotomy - Lumbar four -Lumbar five - bilateral- on-lay noninstrumented fusion;  Surgeon: Eustace Moore, MD;  Location: [place] [facility];  Service: Neurosurgery;  Laterality: Bilateral;   MULTIPLE EXTRACTIONS WITH ALVEOLOPLASTY N/A 05/11/2016   Procedure: EXTRACTION OF TEETH EIGHTEEN, TWENTY AND TWENTY- NINE;  REMOVAL OF MANDIBULAR TORUS AND EXOSTOSIS;  Surgeon: Diona Browner, DDS;  Location: [place];  Service: Oral Surgery;  Laterality: N/A;   POLYPECTOMY  01/24/2021   Procedure: POLYPECTOMY;  Surgeon:  Doran Stabler, MD;  Location: [facility];  Service: Gastroenterology;;   PORTACATH PLACEMENT  06/18/2011   Procedure: INSERTION PORT-A-CATH;  Surgeon: Edward Jolly, MD;  Location: [place];  Service: General;  Laterality: Right;  right subclavian   removal portacath  2014   spur     Apex spur on both big toes   TOE SURGERY Bilateral    TONSILLECTOMY     TOTAL KNEE ARTHROPLASTY Left 09/13/2014   TOTAL  KNEE ARTHROPLASTY Left 09/13/2014   Procedure: LEFT TOTAL KNEE ARTHROPLASTY;  Surgeon: Rod Can, MD;  Location: [place];  Service: Orthopedics;  Laterality: Left;   TOTAL KNEE ARTHROPLASTY Right 03/10/2015   Procedure: RIGHT TOTAL KNEE ARTHROPLASTY;  Surgeon: Rod Can, MD;  Location: [facility];  Service: Orthopedics;  Laterality: Right;    Her Family History Is Significant For: Family History  Problem Relation Age of Onset   Hypertension Mother    Diabetes Mother    Hypertension Father    Diabetes Father    Hypertension Sister    Hyperlipidemia Brother    Breast cancer Paternal Aunt    Cancer Paternal Grandmother        unknown   Colon cancer Neg Hx    Parkinson's disease Neg Hx     Her Social History Is Significant For: Social History   Socioeconomic History   Marital status: Single    Spouse name: Not on file   Number of children: 3   Years of education: Not on file   Highest education level: Not on file  Occupational History   Not on file  Tobacco Use   Smoking status: Never   Smokeless tobacco: Never  Vaping Use   Vaping Use: Never used  Substance and Sexual Activity   Alcohol use: No    Alcohol/week: 0.0 standard drinks of alcohol   Drug use: No   Sexual activity: Never    Birth control/protection: Post-menopausal, Surgical  Other Topics Concern   Not on file  Social History Narrative   Not on file   Social Determinants of Health   Financial Resource Strain: Not on file  Food Insecurity: Not on file  Transportation  Needs: Not on file  Physical Activity: Not on file  Stress: Not on file  Social Connections: Not on file    Her Allergies Are:  Allergies  Allergen Reactions   Aleve [Naproxen] Nausea Only   Compazine [Prochlorperazine] Other (See Comments)    Numbness of face and  lips    Hydrocodone Itching    High dose only   Oxycodone Other (See Comments)    hallucinations    Penicillins Nausea Only and Other (See Comments)    Has patient had a PCN reaction causing immediate rash, facial/tongue/throat swelling, SOB or lightheadedness with hypotension: No Has patient had a PCN reaction causing severe rash involving mucus membranes or skin necrosis: No Has patient had a PCN reaction that required hospitalization No Has patient had a PCN reaction occurring within the last 10 years: No If all of the above answers are "NO", then may proceed with Cephalosporin use.  :   Her Current Medications Are:  Outpatient Encounter Medications as of 12/26/2021  Medication Sig   ACCU-CHEK AVIVA PLUS test strip TEST WHILE FASTING AND 2 HOURS AFTER SUPPER   Accu-Chek Softclix Lancets lancets TEST AS DIRECTED AND 2 HOURS AFTER SUPPER   ALPRAZolam (XANAX) 0.25 MG tablet Take 0.25 mg by mouth See admin instructions. Take one tablet (0.25 mg) by mouth twice daily - morning and mid-afternoon   ALPRAZolam (XANAX) 1 MG tablet Take 0.5 mg by mouth at bedtime.   B-D ULTRAFINE III SHORT PEN 31G X 8 MM MISC USE ONE NEEDLE D   clindamycin (CLEOCIN) 300 MG capsule Take 600 mg by mouth See admin instructions. Take two capsules (600 mg) by mouth one hour prior to dental procedures   cyclobenzaprine (FLEXERIL) 5 MG tablet TAKE 1 TABLET BY MOUTH TWICE A DAY AS  NEEDED FOR MUSCLE SPASMS   Dexlansoprazole 30 MG capsule DR Take 30 mg by mouth every morning.   Dulaglutide (TRULICITY) 1.5 XL/2.4MW SOPN Inject 1.5 mg into the skin every Sunday. At night   DULoxetine (CYMBALTA) 60 MG capsule Take 60 mg by mouth every morning.    furosemide (LASIX) 20 MG tablet Take 20 mg by mouth every morning.   gabapentin (NEURONTIN) 300 MG capsule TAKE 1 CAPSULE BY MOUTH THREE TIMES A DAY (Patient taking differently: Take 300 mg by mouth 3 (three) times daily.)   levocetirizine (XYZAL) 5 MG tablet Take 5 mg by mouth at bedtime.   lisinopril (ZESTRIL) 5 MG tablet Take 5 mg by mouth every morning.   methocarbamol (ROBAXIN) 500 MG tablet Take 1,000 mg by mouth 3 (three) times daily.   metoprolol succinate (TOPROL-XL) 25 MG 24 hr tablet Take 12.5 mg by mouth every morning.   montelukast (SINGULAIR) 10 MG tablet Take 10 mg by mouth every morning.   naloxone (NARCAN) nasal spray 4 mg/0.1 mL Place 0.4 mg into the nose daily as needed (opioid overdose).   rosuvastatin (CRESTOR) 10 MG tablet Take 10 mg by mouth at bedtime.   traMADol (ULTRAM) 50 MG tablet Take 1- 2  tablets three times a day as needed for pain. (Patient taking differently: Take 100 mg by mouth 3 (three) times daily. scheduled)   umeclidinium-vilanterol (ANORO ELLIPTA) 62.5-25 MCG/INH AEPB Inhale 1 puff into the lungs daily.   valbenazine (INGREZZA) 80 MG capsule Take 80 mg by mouth at bedtime. (Patient not taking: Reported on 12/26/2021)   Facility-Administered Encounter Medications as of 12/26/2021  Medication   triamcinolone acetonide (KENALOG) 10 MG/ML injection 10 mg  :  Review of Systems:  Out of a complete 14 point review of systems, all are reviewed and negative with the exception of these symptoms as listed below:  Review of Systems  Neurological:        Pt is here for parkinsonism  follow up . Pt is here due increased coughing ,gagging,and dry heaving. Pt states she does throw up some . Pt states when she eats and brush her teeth symptoms increase . Pt states her PCP too her off ingrezza.  Pt has involuntary movement in tongue.    Objective:  Neurological Exam  Physical Exam Physical Examination:   Vitals:   12/26/21 0730  BP: 116/79  Pulse: 87    General Examination: The patient is a very pleasant 61 y.o. female in no acute distress. She appears well-developed and well-nourished and well groomed.   HEENT: Normocephalic, atraumatic, pupils are equal, round and reactive to light, extraocular tracking is mildly impaired, no nystagmus.  She has mild facial masking which is actually better compared to last time, no significant nuchal rigidity today.  She has no facial tremor but she does have intermittent tongue thrusting.  No dysarthria.   Chest: Clear to auscultation without wheezing, rhonchi or crackles noted.   Heart: S1+S2+0, regular and normal without murmurs, rubs or gallops noted.    Abdomen: Soft, non-tender and non-distended but truncal obesity.   Extremities: There is nonpitting puffiness in the distal lower extremities bilaterally.    Skin: Warm and dry without trophic changes noted.    Musculoskeletal: exam reveals no obvious joint deformities, increase in lumbar kyphosis noted.  Reports knee pain.   Neurologically:  Mental status: The patient is awake, alert and oriented but unable to provide a detailed history. Affect is flat. Bradyphrenia noted, less than before. Cranial nerves  II - XII are as described above under HEENT exam.  Motor exam: Normal bulk, strength and slight increase in tone, appears to be better, no significant resting tremor noted today, mild postural tremor in both upper extremities. Romberg is not tested due to safety concerns.  Reflexes are 1+ in the upper extremities and absent in the lower extremities.  On fine motor skills and coordination: She has overall mild to moderate difficulty with fine motor skills.  No lateralization. Cerebellar testing: No dysmetria or intention tremor. There is no truncal or gait ataxia.  Sensory exam: intact to light touch in the upper and lower extremities.  Gait, station and balance: She stands with mild difficulty and has to push herself up.  She did not bring her cane  but used her aunts cane to walk for me, no shuffling noted.  Fairly preserved arm swing on the left while holding the cane on the right.   Assessment and Plan:  In summary, Kelsey Wilson is a very pleasant 61 year old female with an underlying complex medical history of peripheral edema, peripheral neuropathy, OSA, morbid obesity, type 2 diabetes, seasonal allergies, anemia, arthritis, history of breast cancer, carpal tunnel syndrome, chronic back pain, depression, anxiety, diverticulitis, fibromyalgia, reflux disease and edema, who presents for follow-up consultation of her tremor disorder.  She does not have much in the way of parkinsonism on examination today, seems to be improved from last time.  She has involuntary tongue movements but no other choreiform or athetoid movements.  I am not quite sure as to why she has these involuntary movements, she was on Ingrezza for years as I understand.  She was taken off of it by her primary care last month or about 2 months ago.  She has previously seen another neurologist, she had a brain MRI last year which did not show any acute or sinister chronic findings.  We talked about this disease.  She would be willing to consider further proceed.  I am not quite sure as to the nature of her involuntary movements, she denies history of taking antipsychotic medication such as Abilify, Risperdal, Haldol, Geodon.  She is advised to follow-up with her primary care physician or nurse practitioner and discussed a referral to a tertiary care neurology department for further evaluation and management for involuntary movements.  These movements can be potentially secondary to current or prior psychotropic medications still, that would be my assessment.  At this juncture, I suggested we proceed with genetic testing for Huntington's panel and chorea.  If she is able to proceed with the test, we will plan a follow-up in this clinic.  She would like to see if insurance would cover the  test which is understandable as it is a costly test typically.  She is advised to discuss my recommendations with her primary care and proceed with a referral to a academic neurology center.  I answered all the questions today and the patient and her aunt were in agreement.  We will request recent blood test results and office visit notes from her primary care nurse practitioner, Ms. Anderson.  Patient reports that she had an appointment yesterday but no blood work yesterday.  We will request recent blood test results from the past approximately 3 months.   Addendum, after we prepared paperwork for the genetic test and the nurse went into get patient's information and her signature for the [employer] form for Huntington's/comprehensive chorea panel to provide patient with her written instructions, patient reported that  she would like to forego testing and follow-up with her primary care to discuss a referral as we discussed but she declined doing the genetic test at this time.   I spent 40 minutes in total face-to-face time and in reviewing records during pre-charting, more than 50% of which was spent in counseling and coordination of care, reviewing test results, reviewing medications and treatment regimen and/or in discussing or reviewing the diagnosis of abnormal involuntary movements, the prognosis and treatment options. Pertinent laboratory and imaging test results that were available during this visit with the patient were reviewed by me and considered in my medical decision making (see chart for details).

## 2021-12-26 NOTE — Patient Instructions (Addendum)
I am not quite sure how to explain your involuntary movements. We will try to do genetic testing for a condition called Huntington's disease, if covered by your insurance. I do not see any evidence of parkinson's disease.  If you end up having the genetic test done and once I get the results we will plan a follow-up in this clinic accordingly.  Please follow up with your primary care to discuss referral to another neurologist, I would recommend a referral to an academic center, such as [facility], [facility] or [facility]. I had requested records from your primary care last time and will request records and recent labs again today.

## 2021-12-27 ENCOUNTER — Telehealth: Payer: Self-pay | Admitting: *Deleted

## 2021-12-27 NOTE — Telephone Encounter (Signed)
I called and spoke to [place], at [facility].  She said that their office did call and make appt for pt. relating to symptoms that pt presented with.   She is to fax last ofv note and phone message to [phone].

## 2021-12-31 NOTE — Progress Notes (Signed)
HPI F never smoker followed for OSA,, Asthma, allergic rhinitis,  lung nodules, complicated by DM2, hx Breast cancer L in remission, HBP HST 01/30/2019- AHI 23.7/ hr, desaturation to 71%, body weight 293 lbs NPSG 07/23/2016- AHI 116.6/ hr, desaturation to 72%   ------------------------------------------------------------------------------------   01/02/21- 59 yoF never smoker followed for  Asthma, lung nodules, Hx OSA (failed CPAP) complicated by DM2, hx Breast cancer L in remission, HBP, GERD -Anoro, Singulair, Body weight today-     281 lbs                                   Needs f/u CT for nodules Covid vax- 2 Phizer -----Pt not currently using cpap machine. Quit using CPAP in March. Just stopped bothering- was falling asleep before she put it on. She is vague about noting any difference with daytime tiredness, snoring, etc. Discussed mdical indications, comparing OSA to HTN. She states she will go back to wearing CPAP. Other than potential weight loss, other interventions not likely to help her.  Discussed CT- stable nodules. She will follow w Oncology as before. CT chest 11/16/20- IMPRESSION: 1. Occasional small pulmonary nodules are stable and almost certainly benign sequelae of prior infection or inflammation. Additional follow-up only if indicated by clinical oncology imaging protocol. 2. Postoperative findings of left lumpectomy and axillary lymph node dissection.  01/02/22- 60 yoF never smoker followed for  Asthma, lung nodules, Hx OSA (failed CPAP) complicated by DM2, hx Breast cancer L in remission, HBP, GERD, Tremor/ movement disorder/ Neurology,  -Anoro, Singulair, Body weight today-                                       Needs f/u CT for nodules   ?? Covid vax- 2 Phizer -----Pt not currently using cpap machine. -----Follow-up: Cough, SOB, gagging a little. Evaluated by Neurology for possible movement disorder. Coughs more easily now with eating, drinking. Asthma controlled  with Anoro, Singulair.  ROS-see HPI   + = positive Constitutional:    weight loss, night sweats, fevers, chills, fatigue, lassitude. HEENT:    headaches, +difficulty swallowing, +tooth/dental problems, sore throat,       +sneezing,+ itching,+ ear ache, nasal congestion, post nasal drip, snoring CV:    chest pain, orthopnea, PND, +swelling in lower extremities, anasarca,                                   dizziness, palpitations Resp:   +shortness of breath with exertion or at rest.                productive cough,   non-productive cough, coughing up of blood.              change in color of mucus.  wheezing.   Skin:    rash or lesions. GI:  No-   heartburn, indigestion, +abdominal pain, nausea, vomiting, diarrhea,                 change in bowel habits, loss of appetite GU: dysuria, change in color of urine, no urgency or frequency.   flank pain. MS:   +joint pain, stiffness, decreased range of motion, back pain. Neuro-     nothing unusual Psych:  change in mood or affect.  depression or +anxiety.   memory loss.  OBJ- Physical Exam General- Alert, Oriented, Affect-appropriate, Distress- none acute, + morbidly obese Skin- rash-none, lesions- none, excoriation- none Lymphadenopathy- none Head- atraumatic            Eyes- Gross vision intact, PERRLA, conjunctivae and secretions clear            Ears- Hearing, canals-normal            Nose- Clear, no-Septal dev, mucus, polyps, erosion, perforation             Throat- Mallampati III , mucosa clear , drainage- none, tonsils- atrophic, + teeth Neck- flexible , trachea midline, no stridor , thyroid nl, carotid no bruit Chest - symmetrical excursion , unlabored           Heart/CV- RRR , no murmur , no gallop  , no rub, nl s1 s2                           - JVD- none , edema- none, stasis changes- none, varices- none           Lung- clear to P&A, wheeze- none, cough- none , dullness-none, rub- none           Chest wall- +Scar from port  access Abd-  Br/ Gen/ Rectal- Not done, not indicated Extrem- cyanosis- none, clubbing, none, atrophy- none, strength- nl, + cane Neuro- +small twitching movements

## 2022-01-02 ENCOUNTER — Encounter: Payer: Self-pay | Admitting: Internal Medicine

## 2022-01-02 ENCOUNTER — Ambulatory Visit (INDEPENDENT_AMBULATORY_CARE_PROVIDER_SITE_OTHER): Payer: Medicare HMO

## 2022-01-02 ENCOUNTER — Ambulatory Visit (INDEPENDENT_AMBULATORY_CARE_PROVIDER_SITE_OTHER): Payer: Medicare HMO | Admitting: Internal Medicine

## 2022-01-02 VITALS — BP 110/78 | HR 97 | Temp 98.1°F | Ht 62.0 in | Wt 269.6 lb

## 2022-01-02 DIAGNOSIS — J4540 Moderate persistent asthma, uncomplicated: Secondary | ICD-10-CM | POA: Diagnosis not present

## 2022-01-02 DIAGNOSIS — R911 Solitary pulmonary nodule: Secondary | ICD-10-CM | POA: Diagnosis not present

## 2022-01-02 DIAGNOSIS — J4530 Mild persistent asthma, uncomplicated: Secondary | ICD-10-CM

## 2022-01-02 DIAGNOSIS — K219 Gastro-esophageal reflux disease without esophagitis: Secondary | ICD-10-CM

## 2022-01-02 DIAGNOSIS — J45909 Unspecified asthma, uncomplicated: Secondary | ICD-10-CM | POA: Diagnosis not present

## 2022-01-02 DIAGNOSIS — R059 Cough, unspecified: Secondary | ICD-10-CM | POA: Diagnosis not present

## 2022-01-02 MED ORDER — ALBUTEROL SULFATE HFA 108 (90 BASE) MCG/ACT IN AERS: 2.0000 | INHALATION_SPRAY | Freq: Four times a day (QID) | RESPIRATORY_TRACT | 12 refills | Status: DC | PRN

## 2022-01-02 NOTE — Patient Instructions (Signed)
Order- CXR   dx cough, Asthma moderate persistent  Script sent for Proair inhaler     inhale 2 puffs every 6 hours If Needed. Keep on using your Anoro.  Be careful and deliberate when you are swallowing, to keep food and drink from going into your lungs.

## 2022-01-03 ENCOUNTER — Ambulatory Visit (INDEPENDENT_AMBULATORY_CARE_PROVIDER_SITE_OTHER): Payer: Medicare HMO | Admitting: Podiatry

## 2022-01-03 DIAGNOSIS — B351 Tinea unguium: Secondary | ICD-10-CM | POA: Diagnosis not present

## 2022-01-03 DIAGNOSIS — L600 Ingrowing nail: Secondary | ICD-10-CM

## 2022-01-03 DIAGNOSIS — M79674 Pain in right toe(s): Secondary | ICD-10-CM

## 2022-01-03 DIAGNOSIS — L02612 Cutaneous abscess of left foot: Secondary | ICD-10-CM

## 2022-01-03 DIAGNOSIS — E119 Type 2 diabetes mellitus without complications: Secondary | ICD-10-CM

## 2022-01-03 DIAGNOSIS — M79675 Pain in left toe(s): Secondary | ICD-10-CM

## 2022-01-03 DIAGNOSIS — L03032 Cellulitis of left toe: Secondary | ICD-10-CM

## 2022-01-03 DIAGNOSIS — E1149 Type 2 diabetes mellitus with other diabetic neurological complication: Secondary | ICD-10-CM

## 2022-01-03 MED ORDER — DOXYCYCLINE HYCLATE 100 MG PO CAPS: ORAL_CAPSULE | ORAL | 0 refills | Status: DC

## 2022-01-03 NOTE — Progress Notes (Signed)
Spoke with pt and notified of results per Dr. Young Pt verbalized understanding and denied any questions. 

## 2022-01-03 NOTE — Patient Instructions (Signed)
EPSOM SALT FOOT SOAK INSTRUCTIONS  *IF YOU HAVE BEEN PRESCRIBED ANTIBIOTICS, TAKE AS INSTRUCTED UNTIL ALL ARE GONE*  Shopping List:  A. Plain epsom salt (not scented) B. Neosporin Cream C. 1-inch fabric band-aids   Place 1/4 cup of epsom salts in 2 quarts of warm tap water. IF YOU ARE DIABETIC, OR HAVE NEUROPATHY, CHECK THE TEMPERATURE OF THE WATER WITH YOUR ELBOW.   Submerge your foot/feet in the solution and soak for 10-15 minutes.      3.  Next, remove your foot/feet from solution, blot dry the affected area.    4.  Apply light amount of antibiotic cream/ointment and cover with fabric band-aid .  5.  This soak should be done once a day for 7 days.   6.  Monitor for any signs/symptoms of infection such as redness, swelling, odor, drainage, increased pain, or non-healing of digit.   7.  Please do not hesitate to call the office and speak to a Nurse or Doctor if you have questions.   8.  If you experience fever, chills, nightsweats, nausea or vomiting with worsening of digit/foot, please go to the emergency room.   

## 2022-01-08 ENCOUNTER — Encounter: Payer: Self-pay | Admitting: Podiatry

## 2022-01-08 NOTE — Progress Notes (Signed)
  Subjective:  Patient ID: Kelsey Wilson, female    DOB: [birth year],  MRN: [id number]  Kelsey Wilson presents to clinic today for at risk foot care with history of diabetic neuropathy and painful elongated mycotic toenails 1-5 bilaterally which are tender when wearing enclosed shoe gear. Pain is relieved with periodic professional debridement.  Last known HgA1c was unknown.   New problem(s): None.   PCP is Vonna Drafts, FNP , and last visit was last week.  Allergies  Allergen Reactions   Salicylates    Aleve [Naproxen] Nausea Only   Compazine [Prochlorperazine] Other (See Comments)    Numbness of face and  lips    Hydrocodone Itching    High dose only   Oxycodone Other (See Comments)    hallucinations    Penicillins Nausea Only and Other (See Comments)    Has patient had a PCN reaction causing immediate rash, facial/tongue/throat swelling, SOB or lightheadedness with hypotension: No Has patient had a PCN reaction causing severe rash involving mucus membranes or skin necrosis: No Has patient had a PCN reaction that required hospitalization No Has patient had a PCN reaction occurring within the last 10 years: No If all of the above answers are "NO", then may proceed with Cephalosporin use.    Review of Systems: Negative except as noted in the HPI.  Objective:  Kelsey Wilson is a pleasant 61 y.o. female in NAD. AAO x 3.  Vascular Examination: CFT <3 seconds b/l. DP/PT pulses faintly palpable b/l. Skin temperature gradient warm to warm b/l. No ischemia or gangrene. No cyanosis or clubbing noted b/l. No pain with calf compression b/l.   Neurological Examination: Sensation grossly intact b/l with 10 gram monofilament. Vibratory sensation intact b/l. Pt has subjective symptoms of neuropathy.  Dermatological Examination: Pedal skin warm and supple b/l. Toenails 1-5 b/l thick, discolored, elongated with subungual debris and pain on dorsal palpation.  Incurvated nailplate  lateral border left hallux.  Nail border hypertrophy minimal. There is tenderness to palpation. Sign(s) of infection: pinpoint area of localized abscess.  Musculoskeletal Examination: Muscle strength 4/5 to all lower extremity muscle groups bilaterally.  Radiographs: None Assessment/Plan: 1. Cellulitis and abscess of toe of left foot     -Patient was evaluated and treated. All patient's and/or POA's questions/concerns answered on today's visit. -Diabetic foot examination performed today. -Patient to continue soft, supportive shoe gear daily. -Mycotic toenails 1-5 bilaterally were debrided in length and girth with sterile nail nippers and dremel without incident. -Offending nail border debrided and curretaged lateral border left hallux utilizing sterile nail nipper and currette. Border(s) cleansed with alcohol and triple antibiotic ointment applied. Dispensed written instructions for once daily epsom salt soaks for 7 days. Call office if there are any concerns. -Rx for Doxycyline 100 mg, #14, to be taken one capsule twice daily for 7 days. -Patient/POA to call should there be question/concern in the interim.  -Follow up with Dr. Blenda Mounts in one week for toe check.  Return in about 1 week (around 01/10/2022).  Marzetta Board, DPM

## 2022-01-09 ENCOUNTER — Ambulatory Visit: Payer: Medicare HMO | Admitting: Neurology

## 2022-01-10 ENCOUNTER — Encounter: Payer: Self-pay | Admitting: Podiatry

## 2022-01-10 ENCOUNTER — Ambulatory Visit (INDEPENDENT_AMBULATORY_CARE_PROVIDER_SITE_OTHER): Payer: Medicare HMO | Admitting: Podiatry

## 2022-01-10 DIAGNOSIS — L600 Ingrowing nail: Secondary | ICD-10-CM | POA: Diagnosis not present

## 2022-01-10 NOTE — Progress Notes (Signed)
  Subjective:  Patient ID: Kelsey Wilson, female    DOB: 26-May-1961,   MRN: [id number]  Chief Complaint  Patient presents with   Foot Problem     Follow up one week  for ingrown toenail left great toe, slight pain in the great toe     62 y.o. female presents for follow up of left ingrown nail slant back procedure with Dr. Elisha Ponder one week ago. Relates still have tenderness in it. Relates she has been soaking and doing neosporin and bandaid.   . Denies any other pedal complaints. Denies n/v/f/c.   Past Medical History:  Diagnosis Date   Anemia    Anxiety    Arthritis    Breast cancer ([place]) 2010   T3N1 invasive ductal carcinoma left breast.Takes Arimidex daily   Bursitis    Carpal tunnel syndrome    Chronic back pain    stenosis   Constipation    takes Colace daily   Depression    takes Benzotropine daily   Diverticulitis of colon    Dyspnea    daily when walking for over 1 yr.   Fibromyalgia 08/2012   GERD (gastroesophageal reflux disease)    takes Dexilant daily   Hemorrhoid    History of blood transfusion    no abnormal reaction noted   History of colon polyps    benign   History of shingles    Joint pain    Joint swelling    Morbid obesity (HCC)    Night muscle spasms    takes Flexeril nightly as needed   Nocturia    OSA (obstructive sleep apnea)    OSA on CPAP    Peripheral edema    takes Furosemide.Just started 01/18/16   Peripheral neuropathy    takes Lyrica daily   Personal history of chemotherapy    2013   Personal history of radiation therapy    2013   Pneumonia    hx of-2015   Pseudoarthrosis of lumbar spine    Seasonal allergies    takes Singulair nightly   SOB (shortness of breath) on exertion    rarely with exertion   Splenorenal shunt malfunction (HCC)    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 Alphonzo Grieve)   Type II diabetes mellitus ([place])     Objective:   Physical Exam: Vascular: DP/PT pulses 2/4 bilateral. CFT <3 seconds. Normal hair growth on digits. No edema.  Skin. No lacerations or abrasions bilateral feet. Left great ingrown nail lateral border incurvated. No erythema edema or purulence noted.  Musculoskeletal: MMT 5/5 bilateral lower extremities in DF, PF, Inversion and Eversion. Deceased ROM in DF of ankle joint.  Neurological: Sensation intact to light touch.   Assessment:   1. Ingrown left greater toenail      Plan:  Patient was evaluated and treated and all questions answered. Toe was evaluated and appears to be healing well. However there is still a section of nail that is still troublesome.  Discussed ingrown nail procedure. Will plan for future appointment to remove.  May continue soaks and neosporin.  Patient to follow-up in a couple weeks for procedure.    Lorenda Peck, DPM

## 2022-01-24 ENCOUNTER — Ambulatory Visit (INDEPENDENT_AMBULATORY_CARE_PROVIDER_SITE_OTHER): Payer: Medicare HMO | Admitting: Podiatry

## 2022-01-24 ENCOUNTER — Encounter: Payer: Self-pay | Admitting: Podiatry

## 2022-01-24 DIAGNOSIS — L600 Ingrowing nail: Secondary | ICD-10-CM

## 2022-01-24 NOTE — Progress Notes (Signed)
Subjective:  Patient ID: Kelsey Wilson, female    DOB: [birth year],   MRN: [id number]  No chief complaint on file.   61 y.o. female presents for follow up of left ingrown nail and desire to do ingrown nail procedure.  Relates still have tenderness in it. Relates she has been soaking and doing neosporin and bandaid.   . Denies any other pedal complaints. Denies n/v/f/c.   Past Medical History:  Diagnosis Date   Anemia    Anxiety    Arthritis    Breast cancer ([place]) 2010   T3N1 invasive ductal carcinoma left breast.Takes Arimidex daily   Bursitis    Carpal tunnel syndrome    Chronic back pain    stenosis   Constipation    takes Colace daily   Depression    takes Benzotropine daily   Diverticulitis of colon    Dyspnea    daily when walking for over 1 yr.   Fibromyalgia 08/2012   GERD (gastroesophageal reflux disease)    takes Dexilant daily   Hemorrhoid    History of blood transfusion    no abnormal reaction noted   History of colon polyps    benign   History of shingles    Joint pain    Joint swelling    Morbid obesity (H[facility])    Night muscle spasms    takes Flexeril nightly as needed   Nocturia    OSA (obstructive sleep apnea)    OSA on CPAP    Peripheral edema    takes Furosemide.Just started 01/18/16   Peripheral neuropathy    takes Lyrica daily   Personal history of chemotherapy    2013   Personal history of radiation therapy    2013   Pneumonia    hx of-2015   Pseudoarthrosis of lumbar spine    Seasonal allergies    takes Singulair nightly   SOB (shortness of breath) on exertion    rarely with exertion   Splenorenal shunt malfunction (HCC)    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 Alphonzo Grieve)   Type II diabetes mellitus ([place])     Objective:  Physical Exam: Vascular: DP/PT pulses 2/4 bilateral. CFT <3 seconds. Normal hair growth on digits. No edema.  Skin. No lacerations or  abrasions bilateral feet. Left great ingrown nail medial and lateral border incurvated. No erythema edema or purulence noted.  Musculoskeletal: MMT 5/5 bilateral lower extremities in DF, PF, Inversion and Eversion. Deceased ROM in DF of ankle joint.  Neurological: Sensation intact to light touch.   Assessment:   1. Ingrown left greater toenail       Plan:  Patient was evaluated and treated and all questions answered. Patient requesting removal of ingrown nail today. Procedure below.  Discussed procedure and post procedure care and patient expressed understanding.  Will follow-up in 2 weeks for nail check or sooner if any problems arise.    Procedure:  Procedure: partial Nail Avulsion of left hallux bilateral nail border.  Surgeon: Lorenda Peck, DPM  Pre-op Dx: Ingrown toenail with infection Post-op: Same  Place of Surgery: Office exam room.  Indications for surgery: Painful and ingrown toenail.    The patient is requesting removal of nail with chemical matrixectomy. Risks and complications were discussed with the patient for which they understand and written consent was obtained. Under sterile conditions a total of 3 mL of  1% lidocaine plain was infiltrated in a hallux  block fashion. Once anesthetized, the skin was prepped in sterile fashion. A tourniquet was then applied. Next the bilateral aspect of hallux nail border was then sharply excised making sure to remove the entire offending nail border.  Next phenol was then applied under standard conditions and copiously irrigated. Silvadene was applied. A dry sterile dressing was applied. After application of the dressing the tourniquet was removed and there is found to be an immediate capillary refill time to the digit. The patient tolerated the procedure well without any complications. Post procedure instructions were discussed the patient for which he verbally understood. Follow-up in two weeks for nail check or sooner if any problems  are to arise. Discussed signs/symptoms of infection and directed to call the office immediately should any occur or go directly to the emergency room. In the meantime, encouraged to call the office with any questions, concerns, changes symptoms.    Lorenda Peck, DPM

## 2022-01-24 NOTE — Patient Instructions (Signed)

## 2022-01-30 ENCOUNTER — Encounter: Payer: Self-pay | Admitting: Podiatry

## 2022-01-30 DIAGNOSIS — H04123 Dry eye syndrome of bilateral lacrimal glands: Secondary | ICD-10-CM | POA: Diagnosis not present

## 2022-01-30 DIAGNOSIS — Z961 Presence of intraocular lens: Secondary | ICD-10-CM | POA: Diagnosis not present

## 2022-01-30 DIAGNOSIS — G4730 Sleep apnea, unspecified: Secondary | ICD-10-CM | POA: Diagnosis not present

## 2022-01-30 DIAGNOSIS — H10413 Chronic giant papillary conjunctivitis, bilateral: Secondary | ICD-10-CM | POA: Diagnosis not present

## 2022-01-30 DIAGNOSIS — E119 Type 2 diabetes mellitus without complications: Secondary | ICD-10-CM | POA: Diagnosis not present

## 2022-02-03 LAB — GLUCOSE, POCT (MANUAL RESULT ENTRY): POC Glucose: 106 mg/dl — AB (ref 70–99)

## 2022-02-04 ENCOUNTER — Encounter: Payer: Self-pay | Admitting: Internal Medicine

## 2022-02-04 NOTE — Assessment & Plan Note (Signed)
Low concern Plan- CXR

## 2022-02-04 NOTE — Assessment & Plan Note (Signed)
Continue Anoro, Singulair. Discussed adding resccue inhaler Plan- add albuterol hfa

## 2022-02-04 NOTE — Assessment & Plan Note (Signed)
Emphasize reflux precautions, chin tuck.  May need swallowing eval.

## 2022-02-06 ENCOUNTER — Encounter: Payer: Self-pay | Admitting: Podiatry

## 2022-02-06 ENCOUNTER — Ambulatory Visit (INDEPENDENT_AMBULATORY_CARE_PROVIDER_SITE_OTHER): Payer: Medicare HMO | Admitting: Podiatry

## 2022-02-06 DIAGNOSIS — L600 Ingrowing nail: Secondary | ICD-10-CM

## 2022-02-06 NOTE — Progress Notes (Signed)
  Subjective:  Patient ID: Kelsey Wilson, female    DOB: [birth year],   MRN: [id number]  Chief Complaint  Patient presents with   Nail Problem    Patient states that toe is aching under the nail. Minor drainage.     61 y.o. female presents for follow-up of left great ingrown toenail procedure. Relates she is doing well and has been soaking as instructed.  . Denies any other pedal complaints. Denies n/v/f/c.   Past Medical History:  Diagnosis Date   Anemia    Anxiety    Arthritis    Breast cancer ([place]) 2010   T3N1 invasive ductal carcinoma left breast.Takes Arimidex daily   Bursitis    Carpal tunnel syndrome    Chronic back pain    stenosis   Constipation    takes Colace daily   Depression    takes Benzotropine daily   Diverticulitis of colon    Dyspnea    daily when walking for over 1 yr.   Fibromyalgia 08/2012   GERD (gastroesophageal reflux disease)    takes Dexilant daily   Hemorrhoid    History of blood transfusion    no abnormal reaction noted   History of colon polyps    benign   History of shingles    Joint pain    Joint swelling    Morbid obesity ([facility])    Night muscle spasms    takes Flexeril nightly as needed   Nocturia    OSA (obstructive sleep apnea)    OSA on CPAP    Peripheral edema    takes Furosemide.Just started 01/18/16   Peripheral neuropathy    takes Lyrica daily   Personal history of chemotherapy    2013   Personal history of radiation therapy    2013   Pneumonia    hx of-2015   Pseudoarthrosis of lumbar spine    Seasonal allergies    takes Singulair nightly   SOB (shortness of breath) on exertion    rarely with exertion   Splenorenal shunt malfunction ([facility])    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 Alphonzo Grieve)   Type II diabetes mellitus ([place])     Objective:  Physical Exam: Vascular: DP/PT pulses 2/4 bilateral. CFT <3 seconds. Normal hair growth on  digits. No edema.  Skin. No lacerations or abrasions bilateral feet. Left hallux nail healing well. No erythema edema or purulence noted.  Musculoskeletal: MMT 5/5 bilateral lower extremities in DF, PF, Inversion and Eversion. Deceased ROM in DF of ankle joint.  Neurological: Sensation intact to light touch.   Assessment:   1. Ingrown left greater toenail      Plan:  Patient was evaluated and treated and all questions answered. Toe was evaluated and appears to be healing well.  May discontinue soaks and neosporin.  Patient to follow-up as needed.    Lorenda Peck, DPM

## 2022-02-07 DIAGNOSIS — R1310 Dysphagia, unspecified: Secondary | ICD-10-CM | POA: Diagnosis not present

## 2022-02-07 DIAGNOSIS — G20 Parkinson's disease: Secondary | ICD-10-CM | POA: Diagnosis not present

## 2022-02-13 ENCOUNTER — Encounter: Payer: Medicare HMO | Admitting: Registered Nurse

## 2022-02-13 ENCOUNTER — Telehealth: Payer: Self-pay

## 2022-02-13 MED ORDER — TRAMADOL HCL 50 MG PO TABS: ORAL_TABLET | ORAL | 0 refills | Status: DC

## 2022-02-13 MED ORDER — CYCLOBENZAPRINE HCL 5 MG PO TABS: ORAL_TABLET | ORAL | 0 refills | Status: DC

## 2022-02-13 NOTE — Telephone Encounter (Signed)
PMP was Reviewed.  Tramadol e-scribed.

## 2022-02-13 NOTE — Telephone Encounter (Signed)
Refill request for Tramadol. Last filled #[id number] on 01/15/22 next appt 02/28/22. Kelsey Wilson

## 2022-02-13 NOTE — Telephone Encounter (Signed)
Request for refill Cyclobenzaprine

## 2022-02-28 ENCOUNTER — Encounter: Payer: Medicare HMO | Attending: Registered Nurse | Admitting: Registered Nurse

## 2022-02-28 VITALS — BP 109/77 | HR 84 | Ht 62.0 in | Wt 277.8 lb

## 2022-02-28 DIAGNOSIS — M797 Fibromyalgia: Secondary | ICD-10-CM

## 2022-02-28 DIAGNOSIS — M7062 Trochanteric bursitis, left hip: Secondary | ICD-10-CM

## 2022-02-28 DIAGNOSIS — M47817 Spondylosis without myelopathy or radiculopathy, lumbosacral region: Secondary | ICD-10-CM | POA: Diagnosis not present

## 2022-02-28 DIAGNOSIS — M961 Postlaminectomy syndrome, not elsewhere classified: Secondary | ICD-10-CM | POA: Insufficient documentation

## 2022-02-28 DIAGNOSIS — M1712 Unilateral primary osteoarthritis, left knee: Secondary | ICD-10-CM | POA: Insufficient documentation

## 2022-02-28 DIAGNOSIS — G629 Polyneuropathy, unspecified: Secondary | ICD-10-CM

## 2022-02-28 DIAGNOSIS — M1711 Unilateral primary osteoarthritis, right knee: Secondary | ICD-10-CM | POA: Insufficient documentation

## 2022-02-28 DIAGNOSIS — Z981 Arthrodesis status: Secondary | ICD-10-CM | POA: Insufficient documentation

## 2022-02-28 DIAGNOSIS — Z5181 Encounter for therapeutic drug level monitoring: Secondary | ICD-10-CM

## 2022-02-28 DIAGNOSIS — G894 Chronic pain syndrome: Secondary | ICD-10-CM | POA: Diagnosis not present

## 2022-02-28 DIAGNOSIS — M7061 Trochanteric bursitis, right hip: Secondary | ICD-10-CM | POA: Insufficient documentation

## 2022-02-28 DIAGNOSIS — Z79891 Long term (current) use of opiate analgesic: Secondary | ICD-10-CM | POA: Diagnosis not present

## 2022-02-28 MED ORDER — TRAMADOL HCL 50 MG PO TABS: ORAL_TABLET | ORAL | 5 refills | Status: DC

## 2022-02-28 NOTE — Progress Notes (Signed)
Subjective:    Patient ID: Kelsey Wilson, female    DOB: 1960-08-20, 61 y.o.   MRN: [id number]  HPI: Kelsey Wilson is a 61 y.o. female who returns for follow up appointment for chronic pain and medication refill. She states her pain is located in her  lower back, bilateral hips, bilateral knees and tingling and burning I her bilateral finger tips and bilateral feet. She rates her pain 7.Her current exercise regime is walking and performing stretching exercises.  Kelsey Wilson Morphine equivalent is 60.00 MME.  She is also prescribed Alprazolam by Kelsey Wilson .We have discussed the black box warning of using opioids and benzodiazepines. I highlighted the dangers of using these drugs together and discussed the adverse events including respiratory suppression, overdose, cognitive impairment and importance of compliance with current regimen. We will continue to monitor and adjust as indicated.    Last UDS was Performed on 08/22/2021    Pain Inventory Average Pain 7 Pain Right Now 7 My pain is tingling and aching  In the last 24 hours, has pain interfered with the following? General activity 7 Relation with others 7 Enjoyment of life 7 What TIME of day is your pain at its worst? morning  Sleep (in general) Fair  Pain is worse with: walking, bending, sitting, and standing Pain improves with: rest and medication Relief from Meds: 2  Family History  Problem Relation Age of Onset   Hypertension Mother    Diabetes Mother    Hypertension Father    Diabetes Father    Hypertension Sister    Hyperlipidemia Brother    Breast cancer Paternal Aunt    Cancer Paternal Grandmother        unknown   Colon cancer Neg Hx    Parkinson's disease Neg Hx    Social History   Socioeconomic History   Marital status: Single    Spouse name: Not on file   Number of children: 3   Years of education: Not on file   Highest education level: Not on file  Occupational History   Not on file   Tobacco Use   Smoking status: Never   Smokeless tobacco: Never  Vaping Use   Vaping Use: Never used  Substance and Sexual Activity   Alcohol use: No    Alcohol/week: 0.0 standard drinks of alcohol   Drug use: No   Sexual activity: Never    Birth control/protection: Post-menopausal, Surgical  Other Topics Concern   Not on file  Social History Narrative   Not on file   Social Determinants of Health   Financial Resource Strain: Not on file  Food Insecurity: Not on file  Transportation Needs: Not on file  Physical Activity: Not on file  Stress: Not on file  Social Connections: Not on file   Past Surgical History:  Procedure Laterality Date   ABDOMINAL HYSTERECTOMY     still has ovaries   APPENDECTOMY     AXILLARY LYMPH NODE DISSECTION  11/28/2011   Procedure: AXILLARY LYMPH NODE DISSECTION;  Surgeon: Edward Jolly, MD;  Location: [place];  Service: General;  Laterality: Left;   BREAST LUMPECTOMY Left 11/28/2011   Malignant   BREAST SURGERY Left 2013   CARPAL TUNNEL RELEASE     Bilateral   CESAREAN SECTION     pt. has had 3   CHOLECYSTECTOMY     COLONOSCOPY     COLONOSCOPY WITH PROPOFOL N/A 01/24/2021   Procedure: COLONOSCOPY WITH PROPOFOL;  Surgeon: Wilfrid Lund  L III, MD;  [facility];  Service: Gastroenterology;  Laterality: N/A;   ESOPHAGOGASTRODUODENOSCOPY (EGD) WITH PROPOFOL N/A 01/24/2021   Procedure: ESOPHAGOGASTRODUODENOSCOPY (EGD) WITH PROPOFOL;  Surgeon: Doran Stabler, MD;  [facility];  Service: Gastroenterology;  Laterality: N/A;   EYE SURGERY Bilateral    cataract removal   KNEE SURGERY     Left Knee   LAMINECTOMY WITH POSTERIOR LATERAL ARTHRODESIS LEVEL 1 N/A 11/29/2017   Procedure: Posterior Lateral Fusion - Lumbar Four-Lumbar Five, removal and replacement of hardware, Laminectomy - Lumbar Four-Lumbar Five;  Surgeon: Eustace Moore, MD;  Location: [facility] OR;  Service: Neurosurgery;  Laterality: N/A;   LAMINECTOMY WITH POSTERIOR  LATERAL ARTHRODESIS LEVEL 2 N/A 06/07/2017   Procedure: Posterior Lateral Fusion Lumbar Three-Four and Transforaminal Interbody Fusion Lumbar Four-Five with Segmental  Pedicle Screw Fixation;  Surgeon: Eustace Moore, MD;  Location: [place];  Service: Neurosurgery;  Laterality: N/A;  Posterior Lateral Fusion Lumbar Three-Four and Transforaminal Interbody Fusion Lumbar Four-Five with Segmental  Pedicle Screw Fixation    LUMBAR FUSION  06/07/2017   POST   LUMBAR LAMINECTOMY/DECOMPRESSION MICRODISCECTOMY Bilateral 01/27/2016   Procedure: Laminectomy and Foraminotomy - Lumbar four -Lumbar five - bilateral- on-lay noninstrumented fusion;  Surgeon: Eustace Moore, MD;  Location: [place] [facility];  Service: Neurosurgery;  Laterality: Bilateral;   MULTIPLE EXTRACTIONS WITH ALVEOLOPLASTY N/A 05/11/2016   Procedure: EXTRACTION OF TEETH EIGHTEEN, TWENTY AND TWENTY- NINE;  REMOVAL OF MANDIBULAR TORUS AND EXOSTOSIS;  Surgeon: Diona Browner, DDS;  Location: [place];  Service: Oral Surgery;  Laterality: N/A;   POLYPECTOMY  01/24/2021   Procedure: POLYPECTOMY;  Surgeon: Doran Stabler, MD;  Location: [facility];  Service: Gastroenterology;;   PORTACATH PLACEMENT  06/18/2011   Procedure: INSERTION PORT-A-CATH;  Surgeon: Edward Jolly, MD;  Location: [place] [facility];  Service: General;  Laterality: Right;  right subclavian   removal portacath  2014   spur     Apex spur on both big toes   TOE SURGERY Bilateral    TONSILLECTOMY     TOTAL KNEE ARTHROPLASTY Left 09/13/2014   TOTAL KNEE ARTHROPLASTY Left 09/13/2014   Procedure: LEFT TOTAL KNEE ARTHROPLASTY;  Surgeon: Rod Can, MD;  Location: [place];  Service: Orthopedics;  Laterality: Left;   TOTAL KNEE ARTHROPLASTY Right 03/10/2015   Procedure: RIGHT TOTAL KNEE ARTHROPLASTY;  Surgeon: Rod Can, MD;  [facility];  Service: Orthopedics;  Laterality: Right;   Past Surgical History:  Procedure Laterality Date   ABDOMINAL HYSTERECTOMY     still  has ovaries   APPENDECTOMY     AXILLARY LYMPH NODE DISSECTION  11/28/2011   Procedure: AXILLARY LYMPH NODE DISSECTION;  Surgeon: Edward Jolly, MD;  Location: [facility];  Service: General;  Laterality: Left;   BREAST LUMPECTOMY Left 11/28/2011   Malignant   BREAST SURGERY Left 2013   CARPAL TUNNEL RELEASE     Bilateral   CESAREAN SECTION     pt. has had 3   CHOLECYSTECTOMY     COLONOSCOPY     COLONOSCOPY WITH PROPOFOL N/A 01/24/2021   Procedure: COLONOSCOPY WITH PROPOFOL;  Surgeon: Doran Stabler, MD;  [facility];  Service: Gastroenterology;  Laterality: N/A;   ESOPHAGOGASTRODUODENOSCOPY (EGD) WITH PROPOFOL N/A 01/24/2021   Procedure: ESOPHAGOGASTRODUODENOSCOPY (EGD) WITH PROPOFOL;  Surgeon: Doran Stabler, MD;  [facility];  Service: Gastroenterology;  Laterality: N/A;   EYE SURGERY Bilateral    cataract removal   KNEE SURGERY  Left Knee   LAMINECTOMY WITH POSTERIOR LATERAL ARTHRODESIS LEVEL 1 N/A 11/29/2017   Procedure: Posterior Lateral Fusion - Lumbar Four-Lumbar Five, removal and replacement of hardware, Laminectomy - Lumbar Four-Lumbar Five;  Surgeon: Eustace Moore, MD;  Location: [facility] OR;  Service: Neurosurgery;  Laterality: N/A;   LAMINECTOMY WITH POSTERIOR LATERAL ARTHRODESIS LEVEL 2 N/A 06/07/2017   Procedure: Posterior Lateral Fusion Lumbar Three-Four and Transforaminal Interbody Fusion Lumbar Four-Five with Segmental  Pedicle Screw Fixation;  Surgeon: Eustace Moore, MD;  Location: [place];  Service: Neurosurgery;  Laterality: N/A;  Posterior Lateral Fusion Lumbar Three-Four and Transforaminal Interbody Fusion Lumbar Four-Five with Segmental  Pedicle Screw Fixation    LUMBAR FUSION  06/07/2017   POST   LUMBAR LAMINECTOMY/DECOMPRESSION MICRODISCECTOMY Bilateral 01/27/2016   Procedure: Laminectomy and Foraminotomy - Lumbar four -Lumbar five - bilateral- on-lay noninstrumented fusion;  Surgeon: Eustace Moore, MD;  Location: [place] [facility];  Service:  Neurosurgery;  Laterality: Bilateral;   MULTIPLE EXTRACTIONS WITH ALVEOLOPLASTY N/A 05/11/2016   Procedure: EXTRACTION OF TEETH EIGHTEEN, TWENTY AND TWENTY- NINE;  REMOVAL OF MANDIBULAR TORUS AND EXOSTOSIS;  Surgeon: Diona Browner, DDS;  Location: [place];  Service: Oral Surgery;  Laterality: N/A;   POLYPECTOMY  01/24/2021   Procedure: POLYPECTOMY;  Surgeon: Doran Stabler, MD;  Location[facility];  Service: Gastroenterology;;   PORTACATH PLACEMENT  06/18/2011   Procedure: INSERTION PORT-A-CATH;  Surgeon: Edward Jolly, MD;  Location: [place];  Service: General;  Laterality: Right;  right subclavian   removal portacath  2014   spur     Apex spur on both big toes   TOE SURGERY Bilateral    TONSILLECTOMY     TOTAL KNEE ARTHROPLASTY Left 09/13/2014   TOTAL KNEE ARTHROPLASTY Left 09/13/2014   Procedure: LEFT TOTAL KNEE ARTHROPLASTY;  Surgeon: Rod Can, MD;  Location: [place];  Service: Orthopedics;  Laterality: Left;   TOTAL KNEE ARTHROPLASTY Right 03/10/2015   Procedure: RIGHT TOTAL KNEE ARTHROPLASTY;  Surgeon: Rod Can, MD;  [facility];  Service: Orthopedics;  Laterality: Right;   Past Medical History:  Diagnosis Date   Anemia    Anxiety    Arthritis    Breast cancer ([place]) 2010   T3N1 invasive ductal carcinoma left breast.Takes Arimidex daily   Bursitis    Carpal tunnel syndrome    Chronic back pain    stenosis   Constipation    takes Colace daily   Depression    takes Benzotropine daily   Diverticulitis of colon    Dyspnea    daily when walking for over 1 yr.   Fibromyalgia 08/2012   GERD (gastroesophageal reflux disease)    takes Dexilant daily   Hemorrhoid    History of blood transfusion    no abnormal reaction noted   History of colon polyps    benign   History of shingles    Joint pain    Joint swelling    Morbid obesity (HCC)    Night muscle spasms    takes Flexeril nightly as needed   Nocturia    OSA (obstructive sleep  apnea)    OSA on CPAP    Peripheral edema    takes Furosemide.Just started 01/18/16   Peripheral neuropathy    takes Lyrica daily   Personal history of chemotherapy    2013   Personal history of radiation therapy    2013   Pneumonia    hx of-2015   Pseudoarthrosis of lumbar  spine    Seasonal allergies    takes Singulair nightly   SOB (shortness of breath) on exertion    rarely with exertion   Splenorenal shunt malfunction (HCC)    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 Alphonzo Grieve)   Type II diabetes mellitus ([place])    BP 109/77   Pulse 84   Ht '5\' 2"'$  (1.575 m)   Wt 277 lb 12.8 oz (126 kg)   SpO2 93%   BMI 50.81 kg/m   Opioid Risk Score:   Fall Risk Score:  `1  Depression screen [facility] 2/9     08/04/2018   10:45 AM 06/06/2018    9:30 AM 04/04/2018   10:03 AM 01/13/2018   10:08 AM 12/13/2017    9:46 AM 06/21/2017   10:44 AM 03/07/2017    9:51 AM  Depression screen PHQ 2/9  Decreased Interest 0 0 0 '3 3 1 2  '$ Down, Depressed, Hopeless 0 0 0 '3 3 1 2  '$ PHQ - 2 Score 0 0 0 '6 6 2 4  '$ Altered sleeping       0  Tired, decreased energy       3  Change in appetite       3  Feeling bad or failure about yourself        2  Trouble concentrating       3  Moving slowly or fidgety/restless       3  Suicidal thoughts       0  PHQ-9 Score       18  Difficult doing work/chores       Extremely dIfficult     Review of Systems  Musculoskeletal:  Positive for back pain.       Bilateral wrist pain Bilateral ankle pain  All other systems reviewed and are negative.     Objective:   Physical Exam Vitals and nursing note reviewed.  Constitutional:      Appearance: Normal appearance.  Cardiovascular:     Rate and Rhythm: Normal rate and regular rhythm.     Pulses: Normal pulses.     Heart sounds: Normal heart sounds.  Pulmonary:     Effort: Pulmonary effort is normal.     Breath sounds: Normal breath sounds.   Musculoskeletal:     Cervical back: Normal range of motion and neck supple.     Comments: Normal Muscle Bulk and Muscle Testing Reveals:  Upper Extremities: Full ROM and Muscle Strength 5/5 Thoracic Paraspinal Tenderness: T-7-T-9 Lumbar Paraspinal Tenderness: L-4-L-5 Bilateral Greater Trochanter Tenderness Lower Extremities : Full ROM and Muscle Strength 5/5 Arises from Table slowly Narrow Based Gait     Skin:    General: Skin is warm and dry.  Neurological:     Mental Status: She is alert and oriented to person, place, and time.  Psychiatric:        Mood and Affect: Mood normal.        Behavior: Behavior normal.         Assessment & Plan:  1. Chronic Sacroiliac Joint Pain: S/P Bilateral Sacroilliac Injection with Dr. Letta Pate on 06/23/2019. With good relief noted. 02/28/2022. 2.. Lumbar Postlaminectomy/ S/P Lumbar Fusion:Spinal Stenosis:S/P Posterior Lateral Fusion L-4-L-5 removal and replacement of hardware, Laminectomy L4-L5 by Dr. Ronnald Ramp on 05/22/2018. Continue HEP as Tolerated and Continue current medication regimen. 02/28/2022   Refill: Increased: Tramadol (50 mg) Take two tablets three times a day as  needed for pain #180   We will continue the opioid monitoring program, this consists of regular clinic visits, examinations, urine drug screen, pill counts as well as use of [place] Controlled Substance Reporting system. A 12 month History has been reviewed on the [place] on 02/28/2022. 3. Lumbar Radiculitis: Continue current medication regime with Gabapentin. 02/28/2022 4. Fibromyalgia: Continue HEP as Tolerated. Continue Current Medication Regimen with Gabapentin. 02/28/2022 5. Bilateral Greater Trochanter Bursitis: Continue to Alternate Ice and Heat Therapy.Continue to monitor.  09/272023. 6. Muscle Spasm: Continue: Flexeril. Continue to Monitor. 02/28/2022. 7. Chronic Pain Syndrome: Continue Current medication regimen and  HEP as Tolerated. Continue to Monitor. 02/28/2022.  8. Bilateral OA of Bilateral Knees: Orthopedist following.  Continue to Monitor. 02/28/2022 9. Bilateral Hand Pain: No complaints today. Continue to Monitor/ ? OA: Continue to Monitor. 02/28/2022 10. Cervicalgia: No complaints Today. Continue to alternate Heat/Ice Therapy. Continue HEP as Tolerated. 02/28/2022. 11. Chronic Bilateral Shoulders: No complaints today. Continue to alternate Ice/ Heat therapy and HEP as Tolerated. 02/28/2022.  12. Bilateral Ankle Pain: Podiatry Following: No complaints today.Continue to Monitor. 02/28/2022   13. Bilateral Fingers with Neuropathic Pain: Contin Gabapentin. Educated on following diabetic diet and tight blood sugar control, she verbalizes understanding. 02/28/2022    F/U in 6 months

## 2022-03-02 ENCOUNTER — Encounter: Payer: Self-pay | Admitting: Registered Nurse

## 2022-03-14 ENCOUNTER — Telehealth: Payer: Self-pay

## 2022-03-14 MED ORDER — CYCLOBENZAPRINE HCL 5 MG PO TABS: ORAL_TABLET | ORAL | 0 refills | Status: DC

## 2022-03-14 NOTE — Telephone Encounter (Signed)
Refill of  Cyclobenzaprine  5 MG per office note.

## 2022-04-11 ENCOUNTER — Telehealth: Payer: Self-pay

## 2022-04-11 MED ORDER — CYCLOBENZAPRINE HCL 5 MG PO TABS: ORAL_TABLET | ORAL | 0 refills | Status: DC

## 2022-04-11 NOTE — Telephone Encounter (Signed)
Refill request for Cyclobenzaprine. Ok a to fill according to last note.

## 2022-04-17 ENCOUNTER — Encounter: Payer: Self-pay | Admitting: Podiatry

## 2022-04-17 ENCOUNTER — Ambulatory Visit (INDEPENDENT_AMBULATORY_CARE_PROVIDER_SITE_OTHER): Payer: Medicare HMO | Admitting: Podiatry

## 2022-04-17 DIAGNOSIS — M79675 Pain in left toe(s): Secondary | ICD-10-CM | POA: Diagnosis not present

## 2022-04-17 DIAGNOSIS — B351 Tinea unguium: Secondary | ICD-10-CM

## 2022-04-17 DIAGNOSIS — E1149 Type 2 diabetes mellitus with other diabetic neurological complication: Secondary | ICD-10-CM

## 2022-04-17 DIAGNOSIS — M79674 Pain in right toe(s): Secondary | ICD-10-CM

## 2022-04-22 NOTE — Progress Notes (Signed)
  Subjective:  Patient ID: Kelsey Wilson, female    DOB: September 12, 1960,  MRN: [id number]  Kelsey Wilson presents to clinic today for at risk foot care with history of diabetic neuropathy and painful thick toenails that are difficult to trim. Pain interferes with ambulation. Aggravating factors include wearing enclosed shoe gear. Pain is relieved with periodic professional debridement.   She did have matrixectomy of left great toe bilateral borders performed by Dr. Blenda Mounts on 01/24/2022.  Chief Complaint  Patient presents with   Nail Problem    DFC  Bg - pt states she is not good with checking her BG A1C - pt does not recall PCP - Dr Selina Cooley , last OV August 2023   New problem(s): None.   PCP is Vonna Drafts, FNP.  Allergies  Allergen Reactions   Salicylates    Aleve [Naproxen] Nausea Only   Compazine [Prochlorperazine] Other (See Comments)    Numbness of face and  lips    Hydrocodone Itching    High dose only   Oxycodone Other (See Comments)    hallucinations    Penicillins Nausea Only and Other (See Comments)    Has patient had a PCN reaction causing immediate rash, facial/tongue/throat swelling, SOB or lightheadedness with hypotension: No Has patient had a PCN reaction causing severe rash involving mucus membranes or skin necrosis: No Has patient had a PCN reaction that required hospitalization No Has patient had a PCN reaction occurring within the last 10 years: No If all of the above answers are "NO", then may proceed with Cephalosporin use.    Review of Systems: Negative except as noted in the HPI.  Objective:  Kelsey Wilson is a pleasant 61 y.o. female morbidly obese in NAD. AAO x 3.  Vascular Examination: CFT <3 seconds b/l. DP/PT pulses faintly palpable b/l. Skin temperature gradient warm to warm b/l. No ischemia or gangrene. No cyanosis or clubbing noted b/l. No pain with calf compression b/l.   Neurological Examination: Sensation grossly  intact b/l with 10 gram monofilament. Vibratory sensation intact b/l. Pt has subjective symptoms of neuropathy.  Dermatological Examination: Pedal skin warm and supple b/l. Toenails 1-5 b/l thick, discolored, elongated with subungual debris and pain on dorsal palpation.    Musculoskeletal Examination: Muscle strength 4/5 to all lower extremity muscle groups bilaterally.  Radiographs: None  Assessment/Plan: 1. Pain due to onychomycosis of toenails of both feet   2. Type II diabetes mellitus with neurological manifestations (HCC)     No orders of the defined types were placed in this encounter.   -Consent given for treatment as described below: -Patient is s/p matrixectomy left great toe bilateral borders. -Continue supportive shoe gear daily. -Toenails 1-5 b/l were debrided in length and girth with sterile nail nippers and dremel without iatrogenic bleeding.  -Patient/POA to call should there be question/concern in the interim.   Return in about 3 months (around 07/18/2022).  Marzetta Board, DPM

## 2022-05-08 ENCOUNTER — Ambulatory Visit (INDEPENDENT_AMBULATORY_CARE_PROVIDER_SITE_OTHER): Payer: Medicare HMO | Admitting: Podiatry

## 2022-05-08 DIAGNOSIS — M7752 Other enthesopathy of left foot: Secondary | ICD-10-CM

## 2022-05-08 DIAGNOSIS — M7751 Other enthesopathy of right foot: Secondary | ICD-10-CM

## 2022-05-08 MED ORDER — TRIAMCINOLONE ACETONIDE 10 MG/ML IJ SUSP
10.0000 mg | Freq: Once | INTRAMUSCULAR | Status: AC
  Administered 2022-05-08: 10 mg

## 2022-05-08 NOTE — Patient Instructions (Signed)

## 2022-05-08 NOTE — Progress Notes (Signed)
Subjective: Chief Complaint  Patient presents with   routine diabetic foot care      61-year-old female presents the office with above concerns.  She states that her feet still hurt on the bottom and the top of her foot.  No specific injury that she reports in the specific area but seems to be more in general.  Injection only helped for short amount of time.  No recent injuries or changes otherwise.   Also her nails are thickened elongated she has difficulty trimming herself.   States sugar has been "fine".   Objective: AAO x3, NAD DP/PT pulses palpable bilaterally, CRT less than 3 seconds Nails are hypertrophic, dystrophic, brittle, discolored, elongated 10. No surrounding redness or drainage. Tenderness nails 1-5 bilaterally. No open lesions or pre-ulcerative lesions are identified today. There is decreased medial arch upon weightbearing bilaterally.  There is still tenderness localized along the lateral aspect of a sinus tarsi as well as the ankle and this is on both feet.  Also, the dorsal aspect of foot.  Discomfort appears to be diffuse.  Not able to appreciate any area of pinpoint tenderness.  Flexor, extensor tendons appear intact.  Achilles tendon intact.  MMT 5/5. No pain with calf compression, swelling, warmth, erythema  Assessment: Capsulitis bilaterally, chronic foot pain; symptomatic onychomycosis    Plan: -All treatment options discussed with the patient including all alternatives, risks, complications.  -Repeat x-rays of bilateral foot and ankles are obtained.  Multiple views obtained.  Mild arthritic changes present along the ankle joint.  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  Arthritic changes present the first MPJ. -I do patient benefit from better shoes and inserts, arch supports.  Awaiting diabetic shoe approval which I think will be beneficial for her.  We discussed another injection but we decided to hold off on this has not been very helpful.  We discussed  topical medication that she can use as well stretching, rehab exercises to be performed at home. -Debrided nails x 10 without any complications or bleeding.  Leonell Lobdell R Madeline Pho DPM 

## 2022-05-21 DIAGNOSIS — G4733 Obstructive sleep apnea (adult) (pediatric): Secondary | ICD-10-CM | POA: Diagnosis not present

## 2022-05-21 DIAGNOSIS — R69 Illness, unspecified: Secondary | ICD-10-CM | POA: Diagnosis not present

## 2022-05-21 DIAGNOSIS — K219 Gastro-esophageal reflux disease without esophagitis: Secondary | ICD-10-CM | POA: Diagnosis not present

## 2022-05-21 DIAGNOSIS — M4800 Spinal stenosis, site unspecified: Secondary | ICD-10-CM | POA: Diagnosis not present

## 2022-05-21 DIAGNOSIS — E785 Hyperlipidemia, unspecified: Secondary | ICD-10-CM | POA: Diagnosis not present

## 2022-05-21 DIAGNOSIS — R609 Edema, unspecified: Secondary | ICD-10-CM | POA: Diagnosis not present

## 2022-05-21 DIAGNOSIS — E1142 Type 2 diabetes mellitus with diabetic polyneuropathy: Secondary | ICD-10-CM | POA: Diagnosis not present

## 2022-05-21 DIAGNOSIS — I10 Essential (primary) hypertension: Secondary | ICD-10-CM | POA: Diagnosis not present

## 2022-05-21 DIAGNOSIS — Z008 Encounter for other general examination: Secondary | ICD-10-CM | POA: Diagnosis not present

## 2022-05-21 DIAGNOSIS — R32 Unspecified urinary incontinence: Secondary | ICD-10-CM | POA: Diagnosis not present

## 2022-05-21 DIAGNOSIS — J302 Other seasonal allergic rhinitis: Secondary | ICD-10-CM | POA: Diagnosis not present

## 2022-05-22 DIAGNOSIS — E1142 Type 2 diabetes mellitus with diabetic polyneuropathy: Secondary | ICD-10-CM | POA: Diagnosis not present

## 2022-05-22 DIAGNOSIS — M6530 Trigger finger, unspecified finger: Secondary | ICD-10-CM | POA: Diagnosis not present

## 2022-05-22 DIAGNOSIS — E1165 Type 2 diabetes mellitus with hyperglycemia: Secondary | ICD-10-CM | POA: Diagnosis not present

## 2022-05-22 DIAGNOSIS — M542 Cervicalgia: Secondary | ICD-10-CM | POA: Diagnosis not present

## 2022-05-22 DIAGNOSIS — E1121 Type 2 diabetes mellitus with diabetic nephropathy: Secondary | ICD-10-CM | POA: Diagnosis not present

## 2022-05-22 DIAGNOSIS — J45909 Unspecified asthma, uncomplicated: Secondary | ICD-10-CM | POA: Diagnosis not present

## 2022-05-22 DIAGNOSIS — I10 Essential (primary) hypertension: Secondary | ICD-10-CM | POA: Diagnosis not present

## 2022-05-22 DIAGNOSIS — G4700 Insomnia, unspecified: Secondary | ICD-10-CM | POA: Diagnosis not present

## 2022-05-22 DIAGNOSIS — K219 Gastro-esophageal reflux disease without esophagitis: Secondary | ICD-10-CM | POA: Diagnosis not present

## 2022-05-22 DIAGNOSIS — E785 Hyperlipidemia, unspecified: Secondary | ICD-10-CM | POA: Diagnosis not present

## 2022-05-22 DIAGNOSIS — Z23 Encounter for immunization: Secondary | ICD-10-CM | POA: Diagnosis not present

## 2022-05-22 DIAGNOSIS — J449 Chronic obstructive pulmonary disease, unspecified: Secondary | ICD-10-CM | POA: Diagnosis not present

## 2022-06-12 ENCOUNTER — Telehealth: Payer: Self-pay

## 2022-06-12 MED ORDER — CYCLOBENZAPRINE HCL 5 MG PO TABS: ORAL_TABLET | ORAL | 0 refills | Status: DC

## 2022-06-12 NOTE — Telephone Encounter (Signed)
Refill request for Cyclobenzaprine

## 2022-06-29 ENCOUNTER — Telehealth: Payer: Self-pay | Admitting: *Deleted

## 2022-06-29 NOTE — Progress Notes (Signed)
  Care Coordination   Note   06/29/2022 Name: SAJE GALLOP MRN: [id number] DOB: 06-02-1961  JESYKA SLAGHT is a 62 y.o. year old female who sees Vonna Drafts, FNP for primary care. I reached out to Neldon Mc by phone today to offer care coordination services.  Ms. Boyar was given information about Care Coordination services today including:   The Care Coordination services include support from the care team which includes your Nurse Coordinator, Clinical Social Worker, or Pharmacist.  The Care Coordination team is here to help remove barriers to the health concerns and goals most important to you. Care Coordination services are voluntary, and the patient may decline or stop services at any time by request to their care team member.   Care Coordination Consent Status: Patient agreed to services and verbal consent obtained.   Follow up plan:  Telephone appointment with care coordination team member scheduled for:  07/13/22  Encounter Outcome:  Pt. Scheduled  [place]  Direct Dial: [phone]

## 2022-07-03 ENCOUNTER — Other Ambulatory Visit: Payer: Self-pay | Admitting: Nurse Practitioner

## 2022-07-03 DIAGNOSIS — Z1231 Encounter for screening mammogram for malignant neoplasm of breast: Secondary | ICD-10-CM

## 2022-07-04 ENCOUNTER — Telehealth: Payer: Self-pay

## 2022-07-04 MED ORDER — GABAPENTIN 300 MG PO CAPS: ORAL_CAPSULE | ORAL | 1 refills | Status: DC

## 2022-07-04 NOTE — Telephone Encounter (Signed)
Refill request

## 2022-07-13 ENCOUNTER — Ambulatory Visit: Payer: Self-pay

## 2022-07-13 NOTE — Patient Outreach (Signed)
  Care Coordination   Initial Visit Note   07/13/2022 Name: Kelsey Wilson MRN: [id number] DOB: 09-04-1960  Kelsey Wilson is a 62 y.o. year old female who sees Vonna Drafts, FNP for primary care. I spoke with  Kelsey Wilson by phone today.  What matters to the patients health and wellness today?   Patient was contacted during this encounter.The patient informed me that she did not require any assisstance at this time, but she would keep [place] in mind for the future.. See below for interventions and patient self-care actives.     Goals Addressed             This Visit's Progress    COMPLETED: Care Coordination Activites- No follow up required        Care Coordination Interventions: Active listening / Reflection utilized  Emotional Support Provided Problem [place] strategies reviewed Discussed/.Educated Care Coordination Program 2.   Discussed/.Educated Annual Wellness Visit 3.   Discussed/.Educated Social Determinates of Health 4.   Please inform PCP if services needed in the future         SDOH assessments and interventions completed:  Yes  SDOH Interventions Today    Flowsheet Row Most Recent Value  SDOH Interventions   Food Insecurity Interventions Intervention Not Indicated  Transportation Interventions Intervention Not Indicated        Care Coordination Interventions:  Yes, provided   Interventions Today    Flowsheet Row Most Recent Value  General Interventions   General Interventions Discussed/Reviewed General Interventions Discussed, Facilities manager conditions, Social work and Pharmacy roles]  Education Interventions   Education Provided Provided Verbal Education       Follow up plan: No further intervention required.   Encounter Outcome:  Pt. Visit Completed   Lazaro Arms RN, BSN, [facility]   Phone: [phone]

## 2022-07-13 NOTE — Patient Instructions (Signed)
Visit Information  Thank you for taking time to visit with me today. Please don't hesitate to contact me if I can be of assistance to you.   Following are the goals we discussed today:   Goals Addressed             This Visit's Progress    COMPLETED: Care Coordination Activites- No follow up required        Care Coordination Interventions: Active listening / Reflection utilized  Emotional Support Provided Problem [place] strategies reviewed Discussed/.Educated Care Coordination Program 2.   Discussed/.Educated Annual Wellness Visit 3.   Discussed/.Educated Social Determinates of Health 4.   Please inform PCP if services needed in the future         OIf you are experiencing a Mental Health or [place] or need someone to talk to, please call 1-800-273-TALK (toll free, 24 hour hotline)  Patient verbalizes understanding of instructions and care plan provided today and agrees to view in [place]. Active MyChart status and patient understanding of how to access instructions and care plan via MyChart confirmed with patient.      Lazaro Arms RN, BSN, [facility]   Phone: [phone]

## 2022-07-30 ENCOUNTER — Ambulatory Visit (INDEPENDENT_AMBULATORY_CARE_PROVIDER_SITE_OTHER): Payer: 59 | Admitting: Gastroenterology

## 2022-07-30 ENCOUNTER — Encounter: Payer: Self-pay | Admitting: Gastroenterology

## 2022-07-30 VITALS — BP 118/76 | HR 93 | Ht 62.0 in | Wt 272.5 lb

## 2022-07-30 DIAGNOSIS — R1312 Dysphagia, oropharyngeal phase: Secondary | ICD-10-CM | POA: Diagnosis not present

## 2022-07-30 DIAGNOSIS — R1011 Right upper quadrant pain: Secondary | ICD-10-CM

## 2022-07-30 NOTE — Progress Notes (Signed)
[facility] Progress Note  Chief Complaint:  Chief Complaint  Patient presents with   Dysphagia    Pt states she has trouble swallowing foods also at night it feeling like she is swallowing her tongue      Abdominal Pain    Pt states she has intermittent RUQ pain    Subjective  History: Patient was last seen in our office by NP Carl Best on 11/30/20 for dysphagia, nausea/vomiting, and abdominal pain. She had an EGD and colonoscopy which showed a 2 cm hiatal hernia, diverticulosis in the left colon, and 1 diminutive polyp in the rectum. The polyp was resected and biopsied- pathology revealed tubular adenoma but no high grade dysplasia or malignancy.    Today, she is complaining of a sensation as if " food is stuck" in her throat intermittently. She has avoided certain food to help improve it, but without much of a change. She describes her discomfort as having a feeling of "swallowing her tongue" and having excessive, heavy saliva that keeps her awake at night. Patient has been dealing with her symptoms for about a year. She also complains of feeling as if her "throat is closing up" while walking at times. She also complains of thick green phlegm intermittently.  She has been evaluated by neurology and they did not suspect that she has Parkinson's and felt that her symptoms were more likely associated with a medication side effect. Her neurologist discontinued her Ingrezza, and patient has not taken this since sometime last year. Kelsey Wilson describes sensation of always needing to "move my tongue out of the way", though this is difficult to characterize.  ROS: Review of Systems  Constitutional:  Positive for fatigue and unexpected weight change. Negative for appetite change and fever.  HENT:  Positive for trouble swallowing and voice change.   Respiratory:  Positive for shortness of breath. Negative for cough.   Cardiovascular:  Positive for leg swelling. Negative for  chest pain.  Gastrointestinal:  Positive for abdominal distention (bloating/gas) and abdominal pain. Negative for anal bleeding, blood in stool, constipation, diarrhea, nausea, rectal pain and vomiting.       +acid reflux + diverticulosis + hemorrhoids  Genitourinary:  Negative for dysuria.  Musculoskeletal:  Positive for arthralgias (arthritis), back pain and myalgias.  Skin:  Negative for rash.  Neurological:  Negative for weakness.  Psychiatric/Behavioral:  Positive for dysphoric mood and sleep disturbance. The patient is nervous/anxious.   All other systems reviewed and are negative.    The patient's Past Medical, Family and Social History were reviewed and are on file in the EMR.  Objective:  Med list reviewed  Current Outpatient Medications:    albuterol (VENTOLIN HFA) 108 (90 Base) MCG/ACT inhaler, Inhale 2 puffs into the lungs every 6 (six) hours as needed for wheezing or shortness of breath., Disp: 8 g, Rfl: 12   ALPRAZolam (XANAX) 0.25 MG tablet, Take 0.25 mg by mouth See admin instructions. Take one tablet (0.25 mg) by mouth twice daily - morning and mid-afternoon, Disp: , Rfl:    clindamycin (CLEOCIN) 300 MG capsule, Take 600 mg by mouth See admin instructions. Take two capsules (600 mg) by mouth one hour prior to dental procedures, Disp: , Rfl:    cyclobenzaprine (FLEXERIL) 5 MG tablet, TAKE 1 TABLET BY MOUTH TWICE A DAY AS NEEDED FOR MUSCLE SPASMS, Disp: 60 tablet, Rfl: 0   Dexlansoprazole 30 MG capsule DR, Take 30 mg by mouth every morning., Disp: , Rfl:  Dulaglutide (TRULICITY) 1.5 0000000 SOPN, Inject 1.5 mg into the skin every Sunday. At night, Disp: , Rfl:    DULoxetine (CYMBALTA) 60 MG capsule, Take 60 mg by mouth every morning., Disp: , Rfl:    furosemide (LASIX) 20 MG tablet, Take 20 mg by mouth every morning., Disp: , Rfl:    gabapentin (NEURONTIN) 300 MG capsule, TAKE 1 CAPSULE BY MOUTH THREE TIMES A DAY, Disp: 90 capsule, Rfl: 1   insulin degludec (TRESIBA  FLEXTOUCH) 200 UNIT/ML FlexTouch Pen, , Disp: , Rfl:    levocetirizine (XYZAL) 5 MG tablet, Take 5 mg by mouth at bedtime., Disp: , Rfl:    lisinopril (ZESTRIL) 5 MG tablet, Take 5 mg by mouth every morning., Disp: , Rfl:    meloxicam (MOBIC) 7.5 MG tablet, , Disp: , Rfl:    metoprolol succinate (TOPROL-XL) 25 MG 24 hr tablet, Take 12.5 mg by mouth every morning., Disp: , Rfl:    montelukast (SINGULAIR) 10 MG tablet, Take 10 mg by mouth every morning., Disp: , Rfl:    naloxone (NARCAN) nasal spray 4 mg/0.1 mL, Place 0.4 mg into the nose daily as needed (opioid overdose)., Disp: , Rfl:    traMADol (ULTRAM) 50 MG tablet, Take 1- 2  tablets three times a day as needed for pain., Disp: 180 tablet, Rfl: 5   umeclidinium-vilanterol (ANORO ELLIPTA) 62.5-25 MCG/INH AEPB, Inhale 1 puff into the lungs daily., Disp: 1 each, Rfl: 12   ACCU-CHEK AVIVA PLUS test strip, TEST WHILE FASTING AND 2 HOURS AFTER SUPPER (Patient not taking: Reported on 07/30/2022), Disp: , Rfl:    Accu-Chek Softclix Lancets lancets, TEST AS DIRECTED AND 2 HOURS AFTER SUPPER (Patient not taking: Reported on 07/30/2022), Disp: , Rfl:    ALPRAZolam (XANAX) 0.5 MG tablet, Take 1 tablet by mouth at bedtime., Disp: , Rfl:    B-D ULTRAFINE Wilson SHORT PEN 31G X 8 MM MISC, USE ONE NEEDLE D, Disp: , Rfl:    bumetanide (BUMEX) 2 MG tablet, Take 1 tablet every day by oral route., Disp: , Rfl:    conjugated estrogens (PREMARIN) vaginal cream, Insert 0.5 applicatorsful twice a week by vaginal route for 60 days. (Patient not taking: Reported on 07/30/2022), Disp: , Rfl:    diclofenac (VOLTAREN) 75 MG EC tablet, , Disp: , Rfl:    dicyclomine (BENTYL) 10 MG capsule, , Disp: , Rfl:    doxycycline (VIBRAMYCIN) 100 MG capsule, Take one capsule twice daily (Patient not taking: Reported on 07/13/2022), Disp: 14 capsule, Rfl: 0   estradiol (CLIMARA - DOSED IN MG/24 HR) 0.0375 mg/24hr patch, , Disp: , Rfl:    ezetimibe (ZETIA) 10 MG tablet, , Disp: , Rfl:     glipiZIDE (GLUCOTROL) 5 MG tablet, , Disp: , Rfl:    ipratropium (ATROVENT) 0.03 % nasal spray, INSTILL 2 SPRAYS INTO EACH NOSTRIL 2 TIMES DAILY (Patient not taking: Reported on 07/30/2022), Disp: , Rfl:    metFORMIN (GLUCOPHAGE) 500 MG tablet, , Disp: , Rfl:    methocarbamol (ROBAXIN) 500 MG tablet, Take 1,000 mg by mouth 3 (three) times daily. (Patient not taking: Reported on 07/30/2022), Disp: , Rfl:    ondansetron (ZOFRAN-ODT) 4 MG disintegrating tablet, DISSOLVE 1 TABLET ON THE TONGUE EVERY 8 HOURS AS NEEDED FOR NAUSEA OR VOMITING (Patient not taking: Reported on 07/30/2022), Disp: , Rfl:    potassium chloride SA (KLOR-CON M) 20 MEQ tablet, , Disp: , Rfl:    pregabalin (LYRICA) 100 MG capsule, 3 (three) times daily. (Patient not taking:  Reported on 07/30/2022), Disp: , Rfl:    rosuvastatin (CRESTOR) 10 MG tablet, Take 10 mg by mouth at bedtime. (Patient not taking: Reported on 07/30/2022), Disp: , Rfl:    temazepam (RESTORIL) 15 MG capsule, TK ONE C PO  HS PRN FOR SLEEP (Patient not taking: Reported on 07/30/2022), Disp: , Rfl:    valbenazine (INGREZZA) 80 MG capsule, Take 80 mg by mouth at bedtime. (Patient not taking: Reported on 07/13/2022), Disp: , Rfl:   Current Facility-Administered Medications:    triamcinolone acetonide (KENALOG) 10 MG/ML injection 10 mg, 10 mg, Other, Once, Stover, [place], DPM  See above-patient reports she is no longer on Ingrezza.  Vital signs in last 24 hrs: Vitals:   07/30/22 1029  BP: 118/76  Pulse: 93  SpO2: 99%   Wt Readings from Last 3 Encounters:  07/30/22 272 lb 8 oz (123.6 kg)  02/28/22 277 lb 12.8 oz (126 kg)  01/02/22 269 lb 9.6 oz (122.3 kg)     Physical Exam General: well-appearing, flattened affect. HEENT: sclera anicteric, oral mucosa moist without lesions Neck: supple, no thyromegaly, JVD or lymphadenopathy Cardiac: Regular without appreciable murmur,  no peripheral edema Pulm: clear to auscultation bilaterally, normal RR and effort  noted Abdomen: soft, obese, right upper quadrant tenderness, with active bowel sounds. No guarding or palpable hepatosplenomegaly. Skin: warm and dry, no jaundice or rash Neuro: She is fidgety, often touching her hand to her mouth or harshly covering her mouth with her hand.  Left arm appears tremulous.  Frequently moving her tongue around Labs:  Pathology 01/24/21: FINAL MICROSCOPIC DIAGNOSIS:   A. RECTUM, POLYPECTOMY:  - Tubular adenoma.  - No high grade dysplasia or malignancy.    GROSS DESCRIPTION:   A.  Received in formalin labeled with the patients name and DOB is a  0.4 cm piece of tan soft tissue, submitted in toto in a single cassette.   ___________________________________________ Radiologic studies:   ____________________________________________ Other:  Upper endoscopy 01/24/21: - 2 cm hiatal hernia. - Normal stomach. - Normal examined duodenum. - No specimens collected. No visible cause for symptoms seen on this study.  Colonoscopy 01/24/21: - Diverticulosis in the left colon. - One diminutive polyp in the rectum, removed with a cold snare. Resected and retrieved. - The examination was otherwise normal on direct and retroflexion views. Benign anal bleeding. No cause seen for abdominal pain on this exam. History and exam findings suggest it is abdominal wall pain.  _____________________________________________ Assessment & Plan   Assessment: Oropharyngeal dysphagia  RUQ pain  Chronic abdominal pain, previous unrevealing workup.  Transfer dysphagia with symptoms described above suggesting a neurologic issue (?  Form of tardive dyskinesia from prior medicines).  Nighttime symptoms as described might also be sleep apnea. Her symptoms are not suggestive of esophageal cause of dysphagia, and I am not currently planning a repeat upper endoscopy.  Essentially normal upper endoscopy 18 months ago except for small hiatal hernia.  Plan: We reviewed her prior EGD results  and I provided reassurance that her study did not have any significant findings that would be contributing to her current symptoms.  I think she needs reevaluation by neurology.  Note forwarded to neurology consult who saw her last year as well as primary care.  30 minutes were spent on this encounter (including chart review, history/exam, counseling/coordination of care, and documentation) > 50% of that time was spent on counseling and coordination of care.    - Kelsey Lund, MD    [facility],  Kelsey Meuse III, MD, have reviewed all documentation for this visit. The documentation on 07/30/22 for the exam, diagnosis, procedures, and orders are all accurate and complete.  I,Kelsey Wilson,acting as a Education administrator for [place], MD.,have documented all relevant documentation on the behalf of Kelsey Stabler, MD,as directed by  Kelsey Stabler, MD while in the presence of Kelsey Stabler, MD.   Kelsey Wilson

## 2022-07-30 NOTE — Patient Instructions (Addendum)
___________________________  If your blood pressure at your visit was 140/90 or greater, please contact your primary care physician to follow up on this.  _______________________________________________________  If you are age 64 or older, your body mass index should be between 23-30. Your Body mass index is 49.84 kg/m. If this is out of the aforementioned range listed, please consider follow up with your Primary Care Provider.  If you are age 69 or younger, your body mass index should be between 19-25. Your Body mass index is 49.84 kg/m. If this is out of the aformentioned range listed, please consider follow up with your Primary Care Provider.   ________________________________________________________  The [facility] providers would like to encourage you to use [facility] to communicate with providers for non-urgent requests or questions.  Due to long hold times on the telephone, sending your provider a message by [facility] may be a faster and more efficient way to get a response.  Please allow 48 business hours for a response.  Please remember that this is for non-urgent requests.  _______________________________________________________ It was a pleasure to see you today!  Thank you for trusting me with your gastrointestinal care!

## 2022-08-06 ENCOUNTER — Ambulatory Visit (INDEPENDENT_AMBULATORY_CARE_PROVIDER_SITE_OTHER): Payer: 59

## 2022-08-06 ENCOUNTER — Ambulatory Visit (INDEPENDENT_AMBULATORY_CARE_PROVIDER_SITE_OTHER): Payer: 59 | Admitting: Podiatry

## 2022-08-06 DIAGNOSIS — M7752 Other enthesopathy of left foot: Secondary | ICD-10-CM | POA: Diagnosis not present

## 2022-08-06 DIAGNOSIS — Q66221 Congenital metatarsus adductus, right foot: Secondary | ICD-10-CM | POA: Diagnosis not present

## 2022-08-06 DIAGNOSIS — M79675 Pain in left toe(s): Secondary | ICD-10-CM | POA: Diagnosis not present

## 2022-08-06 DIAGNOSIS — E1149 Type 2 diabetes mellitus with other diabetic neurological complication: Secondary | ICD-10-CM | POA: Diagnosis not present

## 2022-08-06 DIAGNOSIS — B351 Tinea unguium: Secondary | ICD-10-CM | POA: Diagnosis not present

## 2022-08-06 DIAGNOSIS — R52 Pain, unspecified: Secondary | ICD-10-CM | POA: Diagnosis not present

## 2022-08-06 DIAGNOSIS — Q66222 Congenital metatarsus adductus, left foot: Secondary | ICD-10-CM

## 2022-08-06 DIAGNOSIS — M79674 Pain in right toe(s): Secondary | ICD-10-CM | POA: Diagnosis not present

## 2022-08-06 DIAGNOSIS — M7751 Other enthesopathy of right foot: Secondary | ICD-10-CM | POA: Diagnosis not present

## 2022-08-06 NOTE — Progress Notes (Addendum)
Subjective: Chief Complaint  Patient presents with   routine diabetic foot care      62 year old female presents the office with above concerns.  She states that her feet still hurt on the bottom and the top of her foot.  No specific injury that she reports in the specific area but seems to be more in general.  Injection only helped for short amount of time.  No recent injuries or changes otherwise.   Also her nails are thickened elongated she has difficulty trimming herself.   States sugar has been "fine".   Objective: AAO x3, NAD DP/PT pulses palpable bilaterally, CRT less than 3 seconds Nails are hypertrophic, dystrophic, brittle, discolored, elongated 10. No surrounding redness or drainage. Tenderness nails 1-5 bilaterally. No open lesions or pre-ulcerative lesions are identified today. There is decreased medial arch upon weightbearing bilaterally.  There is still tenderness localized along the lateral aspect of a sinus tarsi as well as the ankle and this is on both feet.  Also, the dorsal aspect of foot.  Discomfort appears to be diffuse.  Not able to appreciate any area of pinpoint tenderness.  Flexor, extensor tendons appear intact.  Achilles tendon intact.  MMT 5/5. No pain with calf compression, swelling, warmth, erythema  Assessment: Capsulitis bilaterally, chronic foot pain; symptomatic onychomycosis    Plan: -All treatment options discussed with the patient including all alternatives, risks, complications.  -Repeat x-rays of bilateral foot and ankles are obtained.  Multiple views obtained.  Mild arthritic changes present along the ankle joint.  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  Arthritic changes present the first MPJ with bunion present.  -I do patient benefit from better shoes and inserts, arch supports.  Awaiting diabetic shoe approval which I think will be beneficial for her.  We discussed another injection but we decided to hold off on this has not been very helpful.   We discussed topical medication that she can use as well stretching, rehab exercises to be performed at home. -Debrided nails x 10 without any complications or bleeding.  Vivi Barrack DPM

## 2022-08-06 NOTE — Patient Instructions (Signed)
For topical medication I like VOLTAREN GEL, ASPERCREME WITH LIDOCAINE, BIOFREEZE  Achilles Tendinitis  with Rehab Achilles tendinitis is a disorder of the Achilles tendon. The Achilles tendon connects the large calf muscles (Gastrocnemius and Soleus) to the heel bone (calcaneus). This tendon is sometimes called the heel cord. It is important for pushing-off and standing on your toes and is important for walking, running, or jumping. Tendinitis is often caused by overuse and repetitive microtrauma. SYMPTOMS Pain, tenderness, swelling, warmth, and redness may occur over the Achilles tendon even at rest. Pain with pushing off, or flexing or extending the ankle. Pain that is worsened after or during activity. CAUSES  Overuse sometimes seen with rapid increase in exercise programs or in sports requiring running and jumping. Poor physical conditioning (strength and flexibility or endurance). Running sports, especially training running down hills. Inadequate warm-up before practice or play or failure to stretch before participation. Injury to the tendon. PREVENTION  Warm up and stretch before practice or competition. Allow time for adequate rest and recovery between practices and competition. Keep up conditioning. Keep up ankle and leg flexibility. Improve or keep muscle strength and endurance. Improve cardiovascular fitness. Use proper technique. Use proper equipment (shoes, skates). To help prevent recurrence, taping, protective strapping, or an adhesive bandage may be recommended for several weeks after healing is complete. PROGNOSIS  Recovery may take weeks to several months to heal. Longer recovery is expected if symptoms have been prolonged. Recovery is usually quicker if the inflammation is due to a direct blow as compared with overuse or sudden strain. RELATED COMPLICATIONS  Healing time will be prolonged if the condition is not correctly treated. The injury must be given plenty of  time to heal. Symptoms can reoccur if activity is resumed too soon. Untreated, tendinitis may increase the risk of tendon rupture requiring additional time for recovery and possibly surgery. TREATMENT  The first treatment consists of rest anti-inflammatory medication, and ice to relieve the pain. Stretching and strengthening exercises after resolution of pain will likely help reduce the risk of recurrence. Referral to a physical therapist or athletic trainer for further evaluation and treatment may be helpful. A walking boot or cast may be recommended to rest the Achilles tendon. This can help break the cycle of inflammation and microtrauma. Arch supports (orthotics) may be prescribed or recommended by your caregiver as an adjunct to therapy and rest. Surgery to remove the inflamed tendon lining or degenerated tendon tissue is rarely necessary and has shown less than predictable results. MEDICATION  Nonsteroidal anti-inflammatory medications, such as aspirin and ibuprofen, may be used for pain and inflammation relief. Do not take within 7 days before surgery. Take these as directed by your caregiver. Contact your caregiver immediately if any bleeding, stomach upset, or signs of allergic reaction occur. Other minor pain relievers, such as acetaminophen, may also be used. Pain relievers may be prescribed as necessary by your caregiver. Do not take prescription pain medication for longer than 4 to 7 days. Use only as directed and only as much as you need. Cortisone injections are rarely indicated. Cortisone injections may weaken tendons and predispose to rupture. It is better to give the condition more time to heal than to use them. HEAT AND COLD Cold is used to relieve pain and reduce inflammation for acute and chronic Achilles tendinitis. Cold should be applied for 10 to 15 minutes every 2 to 3 hours for inflammation and pain and immediately after any activity that aggravates your symptoms. Use ice  packs or an ice massage. Heat may be used before performing stretching and strengthening activities prescribed by your caregiver. Use a heat pack or a warm soak. SEEK MEDICAL CARE IF: Symptoms get worse or do not improve in 2 weeks despite treatment. New, unexplained symptoms develop. Drugs used in treatment may produce side effects.  EXERCISES:  RANGE OF MOTION (ROM) AND STRETCHING EXERCISES - Achilles Tendinitis  These exercises may help you when beginning to rehabilitate your injury. Your symptoms may resolve with or without further involvement from your physician, physical therapist or athletic trainer. While completing these exercises, remember:  Restoring tissue flexibility helps normal motion to return to the joints. This allows healthier, less painful movement and activity. An effective stretch should be held for at least 30 seconds. A stretch should never be painful. You should only feel a gentle lengthening or release in the stretched tissue.  STRETCH  Gastroc, Standing  Place hands on wall. Extend right / left leg, keeping the front knee somewhat bent. Slightly point your toes inward on your back foot. Keeping your right / left heel on the floor and your knee straight, shift your weight toward the wall, not allowing your back to arch. You should feel a gentle stretch in the right / left calf. Hold this position for 10 seconds. Repeat 3 times. Complete this stretch 2 times per day.  STRETCH  Soleus, Standing  Place hands on wall. Extend right / left leg, keeping the other knee somewhat bent. Slightly point your toes inward on your back foot. Keep your right / left heel on the floor, bend your back knee, and slightly shift your weight over the back leg so that you feel a gentle stretch deep in your back calf. Hold this position for 10 seconds. Repeat 3 times. Complete this stretch 2 times per day.  STRETCH  Gastrocsoleus, Standing  Note: This exercise can place a lot of stress  on your foot and ankle. Please complete this exercise only if specifically instructed by your caregiver.  Place the ball of your right / left foot on a step, keeping your other foot firmly on the same step. Hold on to the wall or a rail for balance. Slowly lift your other foot, allowing your body weight to press your heel down over the edge of the step. You should feel a stretch in your right / left calf. Hold this position for 10 seconds. Repeat this exercise with a slight bend in your knee. Repeat 3 times. Complete this stretch 2 times per day.   STRENGTHENING EXERCISES - Achilles Tendinitis These exercises may help you when beginning to rehabilitate your injury. They may resolve your symptoms with or without further involvement from your physician, physical therapist or athletic trainer. While completing these exercises, remember:  Muscles can gain both the endurance and the strength needed for everyday activities through controlled exercises. Complete these exercises as instructed by your physician, physical therapist or athletic trainer. Progress the resistance and repetitions only as guided. You may experience muscle soreness or fatigue, but the pain or discomfort you are trying to eliminate should never worsen during these exercises. If this pain does worsen, stop and make certain you are following the directions exactly. If the pain is still present after adjustments, discontinue the exercise until you can discuss the trouble with your clinician.  STRENGTH - Plantar-flexors  Sit with your right / left leg extended. Holding onto both ends of a rubber exercise band/tubing, loop it around the ball  of your foot. Keep a slight tension in the band. Slowly push your toes away from you, pointing them downward. Hold this position for 10 seconds. Return slowly, controlling the tension in the band/tubing. Repeat 3 times. Complete this exercise 2 times per day.   STRENGTH - Plantar-flexors  Stand  with your feet shoulder width apart. Steady yourself with a wall or table using as little support as needed. Keeping your weight evenly spread over the width of your feet, rise up on your toes.* Hold this position for 10 seconds. Repeat 3 times. Complete this exercise 2 times per day.  *If this is too easy, shift your weight toward your right / left leg until you feel challenged. Ultimately, you may be asked to do this exercise with your right / left foot only.  STRENGTH  Plantar-flexors, Eccentric  Note: This exercise can place a lot of stress on your foot and ankle. Please complete this exercise only if specifically instructed by your caregiver.  Place the balls of your feet on a step. With your hands, use only enough support from a wall or rail to keep your balance. Keep your knees straight and rise up on your toes. Slowly shift your weight entirely to your right / left toes and pick up your opposite foot. Gently and with controlled movement, lower your weight through your right / left foot so that your heel drops below the level of the step. You will feel a slight stretch in the back of your calf at the end position. Use the healthy leg to help rise up onto the balls of both feet, then lower weight only on the right / left leg again. Build up to 15 repetitions. Then progress to 3 consecutive sets of 15 repetitions.* After completing the above exercise, complete the same exercise with a slight knee bend (about 30 degrees). Again, build up to 15 repetitions. Then progress to 3 consecutive sets of 15 repetitions.* Perform this exercise 2 times per day.  *When you easily complete 3 sets of 15, your physician, physical therapist or athletic trainer may advise you to add resistance by wearing a backpack filled with additional weight.  STRENGTH - Plantar Flexors, Seated  Sit on a chair that allows your feet to rest flat on the ground. If necessary, sit at the edge of the chair. Keeping your toes  firmly on the ground, lift your right / left heel as far as you can without increasing any discomfort in your ankle. Repeat 3 times. Complete this exercise 2 times a day.

## 2022-08-08 ENCOUNTER — Encounter: Payer: 59 | Attending: Registered Nurse | Admitting: Registered Nurse

## 2022-08-08 VITALS — BP 117/77 | HR 85 | Ht 62.0 in | Wt 272.0 lb

## 2022-08-08 DIAGNOSIS — M25572 Pain in left ankle and joints of left foot: Secondary | ICD-10-CM | POA: Insufficient documentation

## 2022-08-08 DIAGNOSIS — M25571 Pain in right ankle and joints of right foot: Secondary | ICD-10-CM | POA: Insufficient documentation

## 2022-08-08 DIAGNOSIS — Z5181 Encounter for therapeutic drug level monitoring: Secondary | ICD-10-CM | POA: Insufficient documentation

## 2022-08-08 DIAGNOSIS — Z79891 Long term (current) use of opiate analgesic: Secondary | ICD-10-CM | POA: Insufficient documentation

## 2022-08-08 DIAGNOSIS — G894 Chronic pain syndrome: Secondary | ICD-10-CM | POA: Diagnosis not present

## 2022-08-08 DIAGNOSIS — W19XXXD Unspecified fall, subsequent encounter: Secondary | ICD-10-CM | POA: Diagnosis not present

## 2022-08-08 DIAGNOSIS — M5416 Radiculopathy, lumbar region: Secondary | ICD-10-CM | POA: Diagnosis not present

## 2022-08-08 DIAGNOSIS — M533 Sacrococcygeal disorders, not elsewhere classified: Secondary | ICD-10-CM | POA: Insufficient documentation

## 2022-08-08 DIAGNOSIS — Z981 Arthrodesis status: Secondary | ICD-10-CM | POA: Diagnosis not present

## 2022-08-08 DIAGNOSIS — M7062 Trochanteric bursitis, left hip: Secondary | ICD-10-CM | POA: Diagnosis not present

## 2022-08-08 DIAGNOSIS — Z79899 Other long term (current) drug therapy: Secondary | ICD-10-CM | POA: Diagnosis not present

## 2022-08-08 DIAGNOSIS — M546 Pain in thoracic spine: Secondary | ICD-10-CM | POA: Insufficient documentation

## 2022-08-08 DIAGNOSIS — G629 Polyneuropathy, unspecified: Secondary | ICD-10-CM | POA: Diagnosis not present

## 2022-08-08 DIAGNOSIS — G8929 Other chronic pain: Secondary | ICD-10-CM | POA: Diagnosis not present

## 2022-08-08 DIAGNOSIS — M1712 Unilateral primary osteoarthritis, left knee: Secondary | ICD-10-CM

## 2022-08-08 DIAGNOSIS — M542 Cervicalgia: Secondary | ICD-10-CM | POA: Diagnosis not present

## 2022-08-08 DIAGNOSIS — M7061 Trochanteric bursitis, right hip: Secondary | ICD-10-CM | POA: Insufficient documentation

## 2022-08-08 DIAGNOSIS — F32A Depression, unspecified: Secondary | ICD-10-CM | POA: Diagnosis not present

## 2022-08-08 DIAGNOSIS — X58XXXD Exposure to other specified factors, subsequent encounter: Secondary | ICD-10-CM | POA: Diagnosis not present

## 2022-08-08 DIAGNOSIS — M62838 Other muscle spasm: Secondary | ICD-10-CM | POA: Insufficient documentation

## 2022-08-08 DIAGNOSIS — M79642 Pain in left hand: Secondary | ICD-10-CM | POA: Insufficient documentation

## 2022-08-08 DIAGNOSIS — M47817 Spondylosis without myelopathy or radiculopathy, lumbosacral region: Secondary | ICD-10-CM | POA: Diagnosis not present

## 2022-08-08 DIAGNOSIS — M79641 Pain in right hand: Secondary | ICD-10-CM | POA: Diagnosis not present

## 2022-08-08 DIAGNOSIS — F419 Anxiety disorder, unspecified: Secondary | ICD-10-CM | POA: Diagnosis not present

## 2022-08-08 DIAGNOSIS — M170 Bilateral primary osteoarthritis of knee: Secondary | ICD-10-CM | POA: Insufficient documentation

## 2022-08-08 DIAGNOSIS — M1711 Unilateral primary osteoarthritis, right knee: Secondary | ICD-10-CM

## 2022-08-08 DIAGNOSIS — M797 Fibromyalgia: Secondary | ICD-10-CM | POA: Insufficient documentation

## 2022-08-08 MED ORDER — TRAMADOL HCL 50 MG PO TABS: ORAL_TABLET | ORAL | 5 refills | Status: DC

## 2022-08-08 NOTE — Progress Notes (Signed)
Subjective:    Patient ID: Kelsey Wilson, female    DOB: December 21, 1960, 62 y.o.   MRN: [id number]  HPI: Kelsey Wilson is a 62 y.o. female who returns for follow up appointment for chronic pain and medication refill. She states her pain is located in her mid- lower back pain, bilateral hips and bilateral knee pain. She also reports tingling and burning into her bilateral hands and bilateral feet. She rates her pain 8. Her current exercise regime is walking and performing stretching exercises.  She also reports on 05/30/2022 she was visiting her granddaughter at [facility], and fell out of the bed. SShe states she F/U with her PCP. Educated on [employer], she verbalizes understanding.   Kelsey Wilson Morphine equivalent is 60.00 MME. She  is also prescribed Alprazolam by Selina Cooley .We have discussed the black box warning of using opioids and benzodiazepines. I highlighted the dangers of using these drugs together and discussed the adverse events including respiratory suppression, overdose, cognitive impairment and importance of compliance with current regimen. We will continue to monitor and adjust as indicated.    UDS ordered today.     Pain Inventory Average Pain 8 Pain Right Now 8 My pain is aching  In the last 24 hours, has pain interfered with the following? General activity 6 Relation with others 6 Enjoyment of life 7 What TIME of day is your pain at its worst? evening Sleep (in general) Fair  Pain is worse with: walking, bending, and standing Pain improves with: rest, heat/ice, and medication Relief from Meds: 3  Family History  Problem Relation Age of Onset   Hypertension Mother    Diabetes Mother    Hypertension Father    Diabetes Father    Hypertension Sister    Hyperlipidemia Brother    Breast cancer Paternal Aunt    Cancer Paternal Grandmother        unknown   Colon cancer Neg Hx    Parkinson's disease Neg Hx    Social History   Socioeconomic  History   Marital status: Single    Spouse name: Not on file   Number of children: 3   Years of education: Not on file   Highest education level: Not on file  Occupational History   Not on file  Tobacco Use   Smoking status: Never   Smokeless tobacco: Never  Vaping Use   Vaping Use: Never used  Substance and Sexual Activity   Alcohol use: No    Alcohol/week: 0.0 standard drinks of alcohol   Drug use: No   Sexual activity: Never    Birth control/protection: Post-menopausal, Surgical  Other Topics Concern   Not on file  Social History Narrative   Not on file   Social Determinants of Health   Financial Resource Strain: Not on file  Food Insecurity: Not on file  Transportation Needs: Not on file  Physical Activity: Not on file  Stress: Not on file  Social Connections: Not on file   Past Surgical History:  Procedure Laterality Date   ABDOMINAL HYSTERECTOMY     still has ovaries   APPENDECTOMY     AXILLARY LYMPH NODE DISSECTION  11/28/2011   Procedure: AXILLARY LYMPH NODE DISSECTION;  Surgeon: Edward Jolly, MD;  Location: [place];  Service: General;  Laterality: Left;   BREAST LUMPECTOMY Left 11/28/2011   Malignant   BREAST SURGERY Left 2013   CARPAL TUNNEL RELEASE     Bilateral   CESAREAN SECTION  pt. has had 3   CHOLECYSTECTOMY     COLONOSCOPY     COLONOSCOPY WITH PROPOFOL N/A 01/24/2021   Procedure: COLONOSCOPY WITH PROPOFOL;  Surgeon: Doran Stabler, MD;  Location: [facility];  Service: Gastroenterology;  Laterality: N/A;   ESOPHAGOGASTRODUODENOSCOPY (EGD) WITH PROPOFOL N/A 01/24/2021   Procedure: ESOPHAGOGASTRODUODENOSCOPY (EGD) WITH PROPOFOL;  Surgeon: Doran Stabler, MD;  Location: [facility];  Service: Gastroenterology;  Laterality: N/A;   EYE SURGERY Bilateral    cataract removal   KNEE SURGERY     Left Knee   LAMINECTOMY WITH POSTERIOR LATERAL ARTHRODESIS LEVEL 1 N/A 11/29/2017   Procedure: Posterior Lateral Fusion - Lumbar Four-Lumbar  Five, removal and replacement of hardware, Laminectomy - Lumbar Four-Lumbar Five;  Surgeon: Eustace Moore, MD;  Location: [facility];  Service: Neurosurgery;  Laterality: N/A;   LAMINECTOMY WITH POSTERIOR LATERAL ARTHRODESIS LEVEL 2 N/A 06/07/2017   Procedure: Posterior Lateral Fusion Lumbar Three-Four and Transforaminal Interbody Fusion Lumbar Four-Five with Segmental  Pedicle Screw Fixation;  Surgeon: Eustace Moore, MD;  Location: [place];  Service: Neurosurgery;  Laterality: N/A;  Posterior Lateral Fusion Lumbar Three-Four and Transforaminal Interbody Fusion Lumbar Four-Five with Segmental  Pedicle Screw Fixation    LUMBAR FUSION  06/07/2017   POST   LUMBAR LAMINECTOMY/DECOMPRESSION MICRODISCECTOMY Bilateral 01/27/2016   Procedure: Laminectomy and Foraminotomy - Lumbar four -Lumbar five - bilateral- on-lay noninstrumented fusion;  Surgeon: Eustace Moore, MD;  Location: [place] [facility];  Service: Neurosurgery;  Laterality: Bilateral;   MULTIPLE EXTRACTIONS WITH ALVEOLOPLASTY N/A 05/11/2016   Procedure: EXTRACTION OF TEETH EIGHTEEN, TWENTY AND TWENTY- NINE;  REMOVAL OF MANDIBULAR TORUS AND EXOSTOSIS;  Surgeon: Diona Browner, DDS;  Location: [place];  Service: Oral Surgery;  Laterality: N/A;   POLYPECTOMY  01/24/2021   Procedure: POLYPECTOMY;  Surgeon: Doran Stabler, MD;  Location: [facility];  Service: Gastroenterology;;   PORTACATH PLACEMENT  06/18/2011   Procedure: INSERTION PORT-A-CATH;  Surgeon: Edward Jolly, MD;  Location: [place];  Service: General;  Laterality: Right;  right subclavian   removal portacath  2014   spur     Apex spur on both big toes   TOE SURGERY Bilateral    TONSILLECTOMY     TOTAL KNEE ARTHROPLASTY Left 09/13/2014   TOTAL KNEE ARTHROPLASTY Left 09/13/2014   Procedure: LEFT TOTAL KNEE ARTHROPLASTY;  Surgeon: Rod Can, MD;  Location: [place];  Service: Orthopedics;  Laterality: Left;   TOTAL KNEE ARTHROPLASTY Right 03/10/2015   Procedure: RIGHT TOTAL  KNEE ARTHROPLASTY;  Surgeon: Rod Can, MD;  Location: [facility];  Service: Orthopedics;  Laterality: Right;   Past Surgical History:  Procedure Laterality Date   ABDOMINAL HYSTERECTOMY     still has ovaries   APPENDECTOMY     AXILLARY LYMPH NODE DISSECTION  11/28/2011   Procedure: AXILLARY LYMPH NODE DISSECTION;  Surgeon: Edward Jolly, MD;  Location: [place];  Service: General;  Laterality: Left;   BREAST LUMPECTOMY Left 11/28/2011   Malignant   BREAST SURGERY Left 2013   CARPAL TUNNEL RELEASE     Bilateral   CESAREAN SECTION     pt. has had 3   CHOLECYSTECTOMY     COLONOSCOPY     COLONOSCOPY WITH PROPOFOL N/A 01/24/2021   Procedure: COLONOSCOPY WITH PROPOFOL;  Surgeon: Doran Stabler, MD;  [facility];  Service: Gastroenterology;  Laterality: N/A;   ESOPHAGOGASTRODUODENOSCOPY (EGD) WITH PROPOFOL N/A 01/24/2021   Procedure: ESOPHAGOGASTRODUODENOSCOPY (EGD) WITH PROPOFOL;  Surgeon: Doran Stabler, MD;  Location: [facility];  Service: Gastroenterology;  Laterality: N/A;   EYE SURGERY Bilateral    cataract removal   KNEE SURGERY     Left Knee   LAMINECTOMY WITH POSTERIOR LATERAL ARTHRODESIS LEVEL 1 N/A 11/29/2017   Procedure: Posterior Lateral Fusion - Lumbar Four-Lumbar Five, removal and replacement of hardware, Laminectomy - Lumbar Four-Lumbar Five;  Surgeon: Eustace Moore, MD;  Location: [facility] OR;  Service: Neurosurgery;  Laterality: N/A;   LAMINECTOMY WITH POSTERIOR LATERAL ARTHRODESIS LEVEL 2 N/A 06/07/2017   Procedure: Posterior Lateral Fusion Lumbar Three-Four and Transforaminal Interbody Fusion Lumbar Four-Five with Segmental  Pedicle Screw Fixation;  Surgeon: Eustace Moore, MD;  Location: [place];  Service: Neurosurgery;  Laterality: N/A;  Posterior Lateral Fusion Lumbar Three-Four and Transforaminal Interbody Fusion Lumbar Four-Five with Segmental  Pedicle Screw Fixation    LUMBAR FUSION  06/07/2017   POST   LUMBAR LAMINECTOMY/DECOMPRESSION MICRODISCECTOMY  Bilateral 01/27/2016   Procedure: Laminectomy and Foraminotomy - Lumbar four -Lumbar five - bilateral- on-lay noninstrumented fusion;  Surgeon: Eustace Moore, MD;  Location: [place] [facility];  Service: Neurosurgery;  Laterality: Bilateral;   MULTIPLE EXTRACTIONS WITH ALVEOLOPLASTY N/A 05/11/2016   Procedure: EXTRACTION OF TEETH EIGHTEEN, TWENTY AND TWENTY- NINE;  REMOVAL OF MANDIBULAR TORUS AND EXOSTOSIS;  Surgeon: Diona Browner, DDS;  Location: [place];  Service: Oral Surgery;  Laterality: N/A;   POLYPECTOMY  01/24/2021   Procedure: POLYPECTOMY;  Surgeon: Doran Stabler, MD;  Location: [facility];  Service: Gastroenterology;;   PORTACATH PLACEMENT  06/18/2011   Procedure: INSERTION PORT-A-CATH;  Surgeon: Edward Jolly, MD;  Location: [place];  Service: General;  Laterality: Right;  right subclavian   removal portacath  2014   spur     Apex spur on both big toes   TOE SURGERY Bilateral    TONSILLECTOMY     TOTAL KNEE ARTHROPLASTY Left 09/13/2014   TOTAL KNEE ARTHROPLASTY Left 09/13/2014   Procedure: LEFT TOTAL KNEE ARTHROPLASTY;  Surgeon: Rod Can, MD;  Location: [place];  Service: Orthopedics;  Laterality: Left;   TOTAL KNEE ARTHROPLASTY Right 03/10/2015   Procedure: RIGHT TOTAL KNEE ARTHROPLASTY;  Surgeon: Rod Can, MD;  Location: [facility];  Service: Orthopedics;  Laterality: Right;   Past Medical History:  Diagnosis Date   Anemia    Anxiety    Arthritis    Breast cancer ([place]) 2010   T3N1 invasive ductal carcinoma left breast.Takes Arimidex daily   Bursitis    Carpal tunnel syndrome    Chronic back pain    stenosis   Constipation    takes Colace daily   Depression    takes Benzotropine daily   Diverticulitis of colon    Dyspnea    daily when walking for over 1 yr.   Fibromyalgia 08/2012   GERD (gastroesophageal reflux disease)    takes Dexilant daily   Hemorrhoid    History of blood transfusion    no abnormal reaction noted   History of colon  polyps    benign   History of shingles    Joint pain    Joint swelling    Morbid obesity (HCC)    Night muscle spasms    takes Flexeril nightly as needed   Nocturia    OSA (obstructive sleep apnea)    OSA on CPAP    Peripheral edema    takes Furosemide.Just started 01/18/16   Peripheral neuropathy    takes Lyrica daily  Personal history of chemotherapy    2013   Personal history of radiation therapy    2013   Pneumonia    hx of-2015   Pseudoarthrosis of lumbar spine    Seasonal allergies    takes Singulair nightly   SOB (shortness of breath) on exertion    rarely with exertion   Splenorenal shunt malfunction (HCC)    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 Alphonzo Grieve)   Type II diabetes mellitus ([place])    There were no vitals taken for this visit.  Opioid Risk Score:   Fall Risk Score:  `1  Depression screen [facility] 2/9     08/04/2018   10:45 AM 06/06/2018    9:30 AM 04/04/2018   10:03 AM 01/13/2018   10:08 AM 12/13/2017    9:46 AM 06/21/2017   10:44 AM 03/07/2017    9:51 AM  Depression screen PHQ 2/9  Decreased Interest 0 0 0 '3 3 1 2  '$ Down, Depressed, Hopeless 0 0 0 '3 3 1 2  '$ PHQ - 2 Score 0 0 0 '6 6 2 4  '$ Altered sleeping       0  Tired, decreased energy       3  Change in appetite       3  Feeling bad or failure about yourself        2  Trouble concentrating       3  Moving slowly or fidgety/restless       3  Suicidal thoughts       0  PHQ-9 Score       18  Difficult doing work/chores       Extremely dIfficult     Review of Systems  Musculoskeletal:  Positive for back pain.       B/L shoulders, hip, feet, ankles  All other systems reviewed and are negative.      Objective:   Physical Exam Vitals and nursing note reviewed.  Constitutional:      Appearance: Normal appearance.  Cardiovascular:     Rate and Rhythm: Normal rate and regular rhythm.     Pulses: Normal pulses.     Heart  sounds: Normal heart sounds.  Pulmonary:     Effort: Pulmonary effort is normal.     Breath sounds: Normal breath sounds.  Musculoskeletal:     Cervical back: Normal range of motion and neck supple.     Comments: Normal Muscle Bulk and Muscle Testing Reveals:  Upper Extremities: Full ROM and Muscle Strength 5/5  Thoracic Paraspinal Tenderness: T-7-T-9  Lumbar Paraspinal Tenderness: L-3-L-5 Bilateral Greater Trochanter Tenderness Lower Extremities : Full ROM and Muscle Strength 5/5 Arises from Chair slowly Antalgic Gait     Skin:    General: Skin is warm and dry.  Neurological:     Mental Status: She is alert and oriented to person, place, and time.  Psychiatric:        Mood and Affect: Mood normal.        Behavior: Behavior normal.         Assessment & Plan:  1. Chronic Sacroiliac Joint Pain: S/P Bilateral Sacroilliac Injection with Dr. Letta Pate on 06/23/2019. With good relief noted. 08/08/2022. 2.. Lumbar Postlaminectomy/ S/P Lumbar Fusion:Spinal Stenosis:S/P Posterior Lateral Fusion L-4-L-5 removal and replacement of hardware, Laminectomy L4-L5 by Dr. Ronnald Ramp on 05/22/2018. Continue HEP as Tolerated and Continue current medication regimen. 08/08/2022   Refill:  Tramadol (50  mg) Take two tablets three times a day as needed for pain #180   We will continue the opioid monitoring program, this consists of regular clinic visits, examinations, urine drug screen, pill counts as well as use of [place] Controlled Substance Reporting system. A 12 month History has been reviewed on the [place] on 08/08/2022. 3. Lumbar Radiculitis: Continue current medication regime with Gabapentin. 08/08/2022 4. Fibromyalgia: Continue HEP as Tolerated. Continue Current Medication Regimen with Gabapentin. 08/08/2022 5. Bilateral Greater Trochanter Bursitis: Continue to Alternate Ice and Heat Therapy.Continue to monitor.  03/062024. 6. Muscle Spasm: Continue:  Flexeril. Continue to Monitor. 08/08/2022. 7. Chronic Pain Syndrome: Continue Current medication regimen and HEP as Tolerated. Continue to Monitor. 08/08/2022.  8. Bilateral OA of Bilateral Knees: Orthopedist following.  Continue to Monitor. 08/08/2022 9. Bilateral Hand Pain: No complaints today. Continue to Monitor/ ? OA: Continue to Monitor. 08/08/2022 10. Cervicalgia: No complaints Today. Continue to alternate Heat/Ice Therapy. Continue HEP as Tolerated. 08/08/2022. 11. Chronic Bilateral Shoulders: No complaints today. Continue to alternate Ice/ Heat therapy and HEP as Tolerated. 08/08/2022.  12. Bilateral Ankle Pain: Podiatry Following: No complaints today.Continue to Monitor. 08/08/2022   13. Bilateral Fingers with Neuropathic Pain: Contin Gabapentin. Educated on following diabetic diet and tight blood sugar control, she verbalizes understanding. 08/08/2022 14. Fall at Hospital: Educated on [employer], she verbalizes understanding.    F/U in 6 months

## 2022-08-11 LAB — TOXASSURE SELECT,+ANTIDEPR,UR

## 2022-08-14 ENCOUNTER — Encounter: Payer: Self-pay | Admitting: Registered Nurse

## 2022-08-20 ENCOUNTER — Ambulatory Visit
Admission: RE | Admit: 2022-08-20 | Discharge: 2022-08-20 | Disposition: A | Discharge status: Home / Self Care | Payer: Medicare HMO | Source: Ambulatory Visit | Attending: Nurse Practitioner | Admitting: Nurse Practitioner

## 2022-08-20 DIAGNOSIS — Z1231 Encounter for screening mammogram for malignant neoplasm of breast: Secondary | ICD-10-CM

## 2022-08-22 ENCOUNTER — Telehealth: Payer: Self-pay | Admitting: *Deleted

## 2022-08-22 NOTE — Telephone Encounter (Signed)
Urine drug screen for this encounter is consistent for prescribed medication 

## 2022-08-28 ENCOUNTER — Other Ambulatory Visit: Payer: Self-pay

## 2022-08-28 MED ORDER — GABAPENTIN 300 MG PO CAPS: ORAL_CAPSULE | ORAL | 1 refills | Status: DC

## 2022-08-28 MED ORDER — TRAMADOL HCL 50 MG PO TABS: ORAL_TABLET | ORAL | 5 refills | Status: DC

## 2022-08-28 NOTE — Telephone Encounter (Signed)
Kelsey Wilson patient needing refills on gabapentin and tramadol please

## 2022-09-18 ENCOUNTER — Ambulatory Visit (INDEPENDENT_AMBULATORY_CARE_PROVIDER_SITE_OTHER): Payer: 59

## 2022-09-18 DIAGNOSIS — M7752 Other enthesopathy of left foot: Secondary | ICD-10-CM

## 2022-09-18 DIAGNOSIS — E1149 Type 2 diabetes mellitus with other diabetic neurological complication: Secondary | ICD-10-CM

## 2022-09-18 DIAGNOSIS — M216X1 Other acquired deformities of right foot: Secondary | ICD-10-CM

## 2022-09-18 DIAGNOSIS — M216X2 Other acquired deformities of left foot: Secondary | ICD-10-CM

## 2022-09-18 DIAGNOSIS — M7751 Other enthesopathy of right foot: Secondary | ICD-10-CM

## 2022-09-18 NOTE — Progress Notes (Signed)
Patient presents to the office today for diabetic shoe and insole measuring.  ABN signed.   Documentation of medical necessity will be sent to patient's treating diabetic doctor to verify and sign.   Patient's diabetic provider: DR Roanna Raider  Shoes and insoles will be ordered at that time and patient will be notified for an appointment for fitting when they arrive.   Patient shoe selection-   1st   Shoe choice:   [id number]  Shoe size ordered: 9W

## 2022-09-24 ENCOUNTER — Other Ambulatory Visit: Payer: Self-pay | Admitting: Podiatry

## 2022-09-24 DIAGNOSIS — Q66221 Congenital metatarsus adductus, right foot: Secondary | ICD-10-CM

## 2022-09-24 DIAGNOSIS — E1149 Type 2 diabetes mellitus with other diabetic neurological complication: Secondary | ICD-10-CM

## 2022-09-24 DIAGNOSIS — M21619 Bunion of unspecified foot: Secondary | ICD-10-CM

## 2022-09-26 ENCOUNTER — Telehealth: Payer: Self-pay | Admitting: Neurology

## 2022-09-26 NOTE — Telephone Encounter (Signed)
Okay with provider switch as requested by patient.

## 2022-10-25 ENCOUNTER — Other Ambulatory Visit: Payer: Self-pay | Admitting: Physical Medicine and Rehabilitation

## 2022-10-26 NOTE — Telephone Encounter (Signed)
Gabapentin prescription sent to pharmacy.  

## 2022-11-05 ENCOUNTER — Ambulatory Visit (INDEPENDENT_AMBULATORY_CARE_PROVIDER_SITE_OTHER): Payer: 59 | Admitting: Podiatry

## 2022-11-05 ENCOUNTER — Ambulatory Visit (INDEPENDENT_AMBULATORY_CARE_PROVIDER_SITE_OTHER): Payer: 59

## 2022-11-05 DIAGNOSIS — M7752 Other enthesopathy of left foot: Secondary | ICD-10-CM | POA: Diagnosis not present

## 2022-11-05 DIAGNOSIS — M7751 Other enthesopathy of right foot: Secondary | ICD-10-CM | POA: Diagnosis not present

## 2022-11-05 NOTE — Progress Notes (Signed)
Subjective: No chief complaint on file.  62 year old female presents with the concerns of 5 by about 3 weeks ago.  She states that she went to the hospital but the wait was too long so she left.  She has pain on her foot and ankle on both sides.  She is able to walk on it.  She did see her PCP increased her fluid pill for 10 days and she is taking tramadol.  She is asking if neuropathy is having to do with this.  She is currently on gabapentin by her pain management specialist.  Objective: AAO x3, NAD DP/PT pulses palpable bilaterally, CRT less than 3 seconds There is global tenderness around the ankle on both medial, lateral aspects, Achilles tendon bilaterally with minimal edema.  There is no area of pinpoint tenderness seen most of it seems, soft tissue.  She does get some discomfort along the MPJs on the right side.  There is no area pinpoint tenderness on the midfoot.  Flexor, extensor tendons clinically appear to be intact.  Achilles tendon intact. No pain with calf compression, swelling, warmth, erythema  Assessment: Sprain/tendonitis bilateral ankles  Plan: -All treatment options discussed with the patient including all alternatives, risks, complications.  -X-rays obtained reviewed.  3 views of the foot, ankles were obtained bilaterally.  Arthritic changes present the first MPJ.  On the right side there is chronic calcification noted on the lateral view superior to the calcaneus.  Midfoot arthritic changes are present bilaterally. -Given her discomfort, immobilizing Tri-Lock ankle braces bilaterally to help facilitate soft tissue mobilization, healing. -Consider increased dose of gabapentin but this was prescribed at her pain management provider. -Ice/elevation  Return for bilateral foot/ankle injuries in 3-4 weeks.  Repeat x-rays if still having pain  Vivi Barrack DPM

## 2022-11-26 ENCOUNTER — Ambulatory Visit (INDEPENDENT_AMBULATORY_CARE_PROVIDER_SITE_OTHER): Payer: 59 | Admitting: Podiatry

## 2022-11-26 ENCOUNTER — Ambulatory Visit (INDEPENDENT_AMBULATORY_CARE_PROVIDER_SITE_OTHER): Payer: 59

## 2022-11-26 DIAGNOSIS — M7752 Other enthesopathy of left foot: Secondary | ICD-10-CM | POA: Diagnosis not present

## 2022-11-26 DIAGNOSIS — R609 Edema, unspecified: Secondary | ICD-10-CM | POA: Diagnosis not present

## 2022-11-26 DIAGNOSIS — M7751 Other enthesopathy of right foot: Secondary | ICD-10-CM | POA: Diagnosis not present

## 2022-11-26 NOTE — Progress Notes (Signed)
Subjective: Chief Complaint  Patient presents with   Ankle Pain    Pt states she is still having a lot of foot and ankle pain     62 year old female presents with the above concerns.  She said that she is still having discomfort and she is tired of her feet, ankles hurting.  She states that about the same but the left than the right.  No new injuries or concerns otherwise.  She is on gabapentin as well as prescribed by pain management.  She describes aching sensation at nighttime.  Objective: AAO x3, NAD DP/PT pulses palpable bilaterally, CRT less than 3 seconds There is global tenderness around the ankle on both medial, lateral aspects, Achilles tendon bilaterally with minimal edema.  There is no area of pinpoint tenderness seen most of it seems, soft tissue.  She does get some discomfort along the MPJs on the right side.  There is no area pinpoint tenderness on the midfoot.  Flexor, extensor tendons clinically appear to be intact.  Achilles tendon intact.  Overall symptoms are about unchanged. No pain with calf compression, swelling, warmth, erythema  Assessment: Sprain/tendonitis bilateral ankles  Plan: -All treatment options discussed with the patient including all alternatives, risks, complications.  -Given continuation of the symptoms will order MRI of the left ankle to reevaluate this.  Discussed both conservative as well as surgical treatment options.  We discussed surgical invention but may not help all of her symptoms meditating is multifactorial. -Discussed physical therapy. -Ankle brace as needed has not been very helpful. -Consider increased dose of gabapentin but this was prescribed at her pain management provider. -Ice/elevation  Return for bilateral foot/ankle injuries in 3-4 weeks.  Repeat x-rays if still having pain  Vivi Barrack DPM

## 2022-11-28 ENCOUNTER — Ambulatory Visit: Payer: 59 | Admitting: Neurology

## 2022-11-29 ENCOUNTER — Ambulatory Visit (HOSPITAL_COMMUNITY)
Admission: RE | Admit: 2022-11-29 | Discharge: 2022-11-29 | Disposition: A | Discharge status: Home / Self Care | Payer: 59 | Source: Ambulatory Visit | Attending: Podiatry | Admitting: Podiatry

## 2022-11-29 DIAGNOSIS — R609 Edema, unspecified: Secondary | ICD-10-CM | POA: Insufficient documentation

## 2022-11-30 ENCOUNTER — Other Ambulatory Visit: Payer: Self-pay | Admitting: Podiatry

## 2022-11-30 DIAGNOSIS — I872 Venous insufficiency (chronic) (peripheral): Secondary | ICD-10-CM

## 2022-12-09 ENCOUNTER — Ambulatory Visit
Admission: RE | Admit: 2022-12-09 | Discharge: 2022-12-09 | Disposition: A | Discharge status: Home / Self Care | Payer: 59 | Source: Ambulatory Visit | Attending: Podiatry | Admitting: Podiatry

## 2022-12-09 DIAGNOSIS — M7752 Other enthesopathy of left foot: Secondary | ICD-10-CM

## 2022-12-13 NOTE — Progress Notes (Signed)
Office Note     CC: Bilateral lower extremity edema Requesting Provider:  Vivi Barrack, DPM  HPI: Kelsey Wilson is a 62 y.o. (March 18, 1961) female who presents at the request of Diamantina Providence, FNP for evaluation of lower extremity swelling.  On exam, she was doing well.  Native of [place], she graduated from [employer].  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  She is a mother of 3, and a grandmother of 13.  She continues to live an active lifestyle, and is independent. She spends her days helping out family, and wrangling grandchildren.  Kelsey Wilson has appreciated bilateral lower extremity swelling which waxes and wanes.  This is not necessarily worse in the evenings, but is noticeable throughout the day.  She denies throbbing burning itching, bleeding or ulceration.  No varicosities.  She has been working toward weight loss, currently weighing 265 pounds with a BMI of 49. Kelsey Wilson denies symptoms of claudication, ischemic rest pain, tissue loss.      Kelsey Wilson wears compression stockings on a daily basis.  She has no history of DVT.  Past Medical History:  Diagnosis Date   Anemia    Anxiety    Arthritis    Breast cancer (HCC) 2010   T3N1 invasive ductal carcinoma left breast.Takes Arimidex daily   Bursitis    Carpal tunnel syndrome    Chronic back pain    stenosis   Constipation    takes Colace daily   Depression    takes Benzotropine daily   Diverticulitis of colon    Dyspnea    daily when walking for over 1 yr.   Fibromyalgia 08/2012   GERD (gastroesophageal reflux disease)    takes Dexilant daily   Hemorrhoid    History of blood transfusion    no abnormal reaction noted   History of colon polyps    benign   History of shingles    Joint pain    Joint swelling    Morbid obesity (HCC)    Night muscle spasms    takes Flexeril nightly as needed   Nocturia    OSA (obstructive sleep apnea)    OSA on CPAP    Peripheral  edema    takes Furosemide.Just started 01/18/16   Peripheral neuropathy    takes Lyrica daily   Personal history of chemotherapy    2013   Personal history of radiation therapy    2013   Pneumonia    hx of-2015   Pseudoarthrosis of lumbar spine    Seasonal allergies    takes Singulair nightly   SOB (shortness of breath) on exertion    rarely with exertion   Splenorenal shunt malfunction (HCC)    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 Midge Aver)   Type II diabetes mellitus (HCC)     Past Surgical History:  Procedure Laterality Date   ABDOMINAL HYSTERECTOMY     still has ovaries   APPENDECTOMY     AXILLARY LYMPH NODE DISSECTION  11/28/2011   Procedure: AXILLARY LYMPH NODE DISSECTION;  Surgeon: Mariella Saa, MD;  Location: [facility] OR;  Service: General;  Laterality: Left;   BREAST LUMPECTOMY Left 11/28/2011   Malignant   BREAST SURGERY Left 2013   CARPAL TUNNEL RELEASE     Bilateral   CESAREAN SECTION     pt. has had 3   CHOLECYSTECTOMY     COLONOSCOPY  COLONOSCOPY WITH PROPOFOL N/A 01/24/2021   Procedure: COLONOSCOPY WITH PROPOFOL;  Surgeon: Sherrilyn Rist, MD;  Location: [facility];  Service: Gastroenterology;  Laterality: N/A;   ESOPHAGOGASTRODUODENOSCOPY (EGD) WITH PROPOFOL N/A 01/24/2021   Procedure: ESOPHAGOGASTRODUODENOSCOPY (EGD) WITH PROPOFOL;  Surgeon: Sherrilyn Rist, MD;  Location: [facility];  Service: Gastroenterology;  Laterality: N/A;   EYE SURGERY Bilateral    cataract removal   KNEE SURGERY     Left Knee   LAMINECTOMY WITH POSTERIOR LATERAL ARTHRODESIS LEVEL 1 N/A 11/29/2017   Procedure: Posterior Lateral Fusion - Lumbar Four-Lumbar Five, removal and replacement of hardware, Laminectomy - Lumbar Four-Lumbar Five;  Surgeon: Tia Alert, MD;  Location: [facility] OR;  Service: Neurosurgery;  Laterality: N/A;   LAMINECTOMY WITH POSTERIOR LATERAL ARTHRODESIS LEVEL 2 N/A 06/07/2017    Procedure: Posterior Lateral Fusion Lumbar Three-Four and Transforaminal Interbody Fusion Lumbar Four-Five with Segmental  Pedicle Screw Fixation;  Surgeon: Tia Alert, MD;  Location: [facility] OR;  Service: Neurosurgery;  Laterality: N/A;  Posterior Lateral Fusion Lumbar Three-Four and Transforaminal Interbody Fusion Lumbar Four-Five with Segmental  Pedicle Screw Fixation    LUMBAR FUSION  06/07/2017   POST   LUMBAR LAMINECTOMY/DECOMPRESSION MICRODISCECTOMY Bilateral 01/27/2016   Procedure: Laminectomy and Foraminotomy - Lumbar four -Lumbar five - bilateral- on-lay noninstrumented fusion;  Surgeon: Tia Alert, MD;  Location: [facility];  Service: Neurosurgery;  Laterality: Bilateral;   MULTIPLE EXTRACTIONS WITH ALVEOLOPLASTY N/A 05/11/2016   Procedure: EXTRACTION OF TEETH EIGHTEEN, TWENTY AND TWENTY- NINE;  REMOVAL OF MANDIBULAR TORUS AND EXOSTOSIS;  Surgeon: Ocie Doyne, DDS;  Location: [facility] OR;  Service: Oral Surgery;  Laterality: N/A;   POLYPECTOMY  01/24/2021   Procedure: POLYPECTOMY;  Surgeon: Sherrilyn Rist, MD;  Location: [facility];  Service: Gastroenterology;;   PORTACATH PLACEMENT  06/18/2011   Procedure: INSERTION PORT-A-CATH;  Surgeon: Mariella Saa, MD;  Location: [facility];  Service: General;  Laterality: Right;  right subclavian   removal portacath  2014   spur     Apex spur on both big toes   TOE SURGERY Bilateral    TONSILLECTOMY     TOTAL KNEE ARTHROPLASTY Left 09/13/2014   TOTAL KNEE ARTHROPLASTY Left 09/13/2014   Procedure: LEFT TOTAL KNEE ARTHROPLASTY;  Surgeon: Samson Frederic, MD;  Location: [facility];  Service: Orthopedics;  Laterality: Left;   TOTAL KNEE ARTHROPLASTY Right 03/10/2015   Procedure: RIGHT TOTAL KNEE ARTHROPLASTY;  Surgeon: Samson Frederic, MD;  Location: [facility];  Service: Orthopedics;  Laterality: Right;    Social History   Socioeconomic History   Marital status: Single    Spouse name: Not on file   Number of children: 3   Years  of education: Not on file   Highest education level: Not on file  Occupational History   Not on file  Tobacco Use   Smoking status: Never   Smokeless tobacco: Never  Vaping Use   Vaping status: Never Used  Substance and Sexual Activity   Alcohol use: No    Alcohol/week: 0.0 standard drinks of alcohol   Drug use: No   Sexual activity: Never    Birth control/protection: Post-menopausal, Surgical  Other Topics Concern   Not on file  Social History Narrative   Not on file   Social Determinants of Health   Financial Resource Strain: Not on file  Food Insecurity: Not on file  Transportation Needs: Not on file  Physical Activity: Not on file  Stress: Not on  file  Social Connections: Unknown (10/16/2021)   Received from [facility]   Social Network    Social Network: Not on file  Intimate Partner Violence: Unknown (09/07/2021)   Received from [facility]   HITS    Physically Hurt: Not on file    Insult or Talk Down To: Not on file    Threaten Physical Harm: Not on file    Scream or Curse: Not on file   Family History  Problem Relation Age of Onset   Hypertension Mother    Diabetes Mother    Hypertension Father    Diabetes Father    Hypertension Sister    Hyperlipidemia Brother    Breast cancer Paternal Aunt    Cancer Paternal Grandmother        unknown   Colon cancer Neg Hx    Parkinson's disease Neg Hx     Current Outpatient Medications  Medication Sig Dispense Refill   ACCU-CHEK AVIVA PLUS test strip      Accu-Chek Softclix Lancets lancets      albuterol (VENTOLIN HFA) 108 (90 Base) MCG/ACT inhaler Inhale 2 puffs into the lungs every 6 (six) hours as needed for wheezing or shortness of breath. 8 g 12   ALPRAZolam (XANAX) 0.25 MG tablet Take 0.25 mg by mouth See admin instructions. Take one tablet (0.25 mg) by mouth twice daily - morning and mid-afternoon     ALPRAZolam (XANAX) 0.5 MG tablet Take 1 tablet by mouth at bedtime.     B-D ULTRAFINE III SHORT PEN 31G  X 8 MM MISC USE ONE NEEDLE D     bumetanide (BUMEX) 2 MG tablet Take 1 tablet every day by oral route.     clindamycin (CLEOCIN) 300 MG capsule Take 600 mg by mouth See admin instructions. Take two capsules (600 mg) by mouth one hour prior to dental procedures     conjugated estrogens (PREMARIN) vaginal cream      cyclobenzaprine (FLEXERIL) 5 MG tablet TAKE 1 TABLET BY MOUTH TWICE A DAY AS NEEDED FOR MUSCLE SPASMS 60 tablet 0   Dexlansoprazole 30 MG capsule DR Take 30 mg by mouth every morning.     dicyclomine (BENTYL) 10 MG capsule      Dulaglutide (TRULICITY) 1.5 MG/0.5ML SOPN Inject 1.5 mg into the skin every Sunday. At night     DULoxetine (CYMBALTA) 60 MG capsule Take 60 mg by mouth every morning.     estradiol (CLIMARA - DOSED IN MG/24 HR) 0.0375 mg/24hr patch      ezetimibe (ZETIA) 10 MG tablet      furosemide (LASIX) 20 MG tablet Take 20 mg by mouth every morning.     gabapentin (NEURONTIN) 300 MG capsule TAKE 1 CAPSULE BY MOUTH THREE TIMES A DAY 90 capsule 3   glipiZIDE (GLUCOTROL) 5 MG tablet      insulin degludec (TRESIBA FLEXTOUCH) 200 UNIT/ML FlexTouch Pen      ipratropium (ATROVENT) 0.03 % nasal spray      levocetirizine (XYZAL) 5 MG tablet Take 5 mg by mouth at bedtime.     lisinopril (ZESTRIL) 5 MG tablet Take 5 mg by mouth every morning.     meloxicam (MOBIC) 7.5 MG tablet      metFORMIN (GLUCOPHAGE) 500 MG tablet      methocarbamol (ROBAXIN) 500 MG tablet Take 1,000 mg by mouth 3 (three) times daily.     metoprolol succinate (TOPROL-XL) 25 MG 24 hr tablet Take 12.5 mg by mouth every morning.  montelukast (SINGULAIR) 10 MG tablet Take 10 mg by mouth every morning.     naloxone (NARCAN) nasal spray 4 mg/0.1 mL Place 0.4 mg into the nose daily as needed (opioid overdose).     ondansetron (ZOFRAN-ODT) 4 MG disintegrating tablet      potassium chloride SA (KLOR-CON M) 20 MEQ tablet      rosuvastatin (CRESTOR) 10 MG tablet Take 10 mg by mouth at bedtime.     temazepam  (RESTORIL) 15 MG capsule      traMADol (ULTRAM) 50 MG tablet Take 1- 2  tablets three times a day as needed for pain. 180 tablet 5   umeclidinium-vilanterol (ANORO ELLIPTA) 62.5-25 MCG/INH AEPB Inhale 1 puff into the lungs daily. 1 each 12   valbenazine (INGREZZA) 80 MG capsule Take 80 mg by mouth at bedtime.     Current Facility-Administered Medications  Medication Dose Route Frequency Provider Last Rate Last Admin   triamcinolone acetonide (KENALOG) 10 MG/ML injection 10 mg  10 mg Other Once Asencion Islam, DPM        Allergies  Allergen Reactions   Salicylates    Aleve [Naproxen] Nausea Only   Compazine [Prochlorperazine] Other (See Comments)    Numbness of face and  lips    Hydrocodone Itching    High dose only   Oxycodone Other (See Comments)    hallucinations    Penicillins Nausea Only and Other (See Comments)    Has patient had a PCN reaction causing immediate rash, facial/tongue/throat swelling, SOB or lightheadedness with hypotension: No Has patient had a PCN reaction causing severe rash involving mucus membranes or skin necrosis: No Has patient had a PCN reaction that required hospitalization No Has patient had a PCN reaction occurring within the last 10 years: No If all of the above answers are "NO", then may proceed with Cephalosporin use.     REVIEW OF SYSTEMS:  [X]  denotes positive finding, [ ]  denotes negative finding Cardiac  Comments:  Chest pain or chest pressure:    Shortness of breath upon exertion:    Short of breath when lying flat:    Irregular heart rhythm:        Vascular    Pain in calf, thigh, or hip brought on by ambulation:    Pain in feet at night that wakes you up from your sleep:     Blood clot in your veins:    Leg swelling:         Pulmonary    Oxygen at home:    Productive cough:     Wheezing:         Neurologic    Sudden weakness in arms or legs:     Sudden numbness in arms or legs:     Sudden onset of difficulty speaking or  slurred speech:    Temporary loss of vision in one eye:     Problems with dizziness:         Gastrointestinal    Blood in stool:     Vomited blood:         Genitourinary    Burning when urinating:     Blood in urine:        Psychiatric    Major depression:         Hematologic    Bleeding problems:    Problems with blood clotting too easily:        Skin    Rashes or ulcers:  Constitutional    Fever or chills:      PHYSICAL EXAMINATION:  There were no vitals filed for this visit.  General:  WDWN in NAD; vital signs documented above Gait: Not observed HENT: WNL, normocephalic Pulmonary: normal non-labored breathing , without Rales, rhonchi,  wheezing Cardiac: regular HR Abdomen: soft, NT, no masses Skin: without rashes Vascular Exam/Pulses:  Right Left  Radial 2+ (normal) 2+ (normal)  Ulnar 2+ (normal) 2+ (normal)  Femoral    Popliteal    DP 2+ (normal) 2+ (normal)  PT     Extremities: without ischemic changes, without Gangrene , without cellulitis; without open wounds;  Surgical scars bilateral knees, bilateral great toes Musculoskeletal: no muscle wasting or atrophy  Neurologic: A&O X 3;  No focal weakness or paresthesias are detected Psychiatric:  The pt has Normal affect.   Non-Invasive Vascular Imaging:   Venous Reflux Times  +--------------+---------+------+-----------+------------+-------------+  RIGHT        Reflux NoRefluxReflux TimeDiameter cmsComments                               Yes                                        +--------------+---------+------+-----------+------------+-------------+  CFV                    yes   >1 second                            +--------------+---------+------+-----------+------------+-------------+  FV mid        no                                                   +--------------+---------+------+-----------+------------+-------------+  Popliteal    no                                                    +--------------+---------+------+-----------+------------+-------------+  GSV at SFJ              yes    >500 ms      .667                   +--------------+---------+------+-----------+------------+-------------+  GSV prox thighno                            .342                   +--------------+---------+------+-----------+------------+-------------+  GSV mid thigh                                       out of fascia  +--------------+---------+------+-----------+------------+-------------+  GSV dist thigh  out of fascia  +--------------+---------+------+-----------+------------+-------------+  GSV at knee                                         out of fascia  +--------------+---------+------+-----------+------------+-------------+  GSV prox calf                                       out of fascia  +--------------+---------+------+-----------+------------+-------------+  SSV Pop Fossa no                            .225                   +--------------+---------+------+-----------+------------+-------------+  SSV prox calf no                            .213                   +--------------+---------+------+-----------+------------+-------------+     +--------------+---------+------+-----------+------------+-------------+  LEFT         Reflux NoRefluxReflux TimeDiameter cmsComments                               Yes                                        +--------------+---------+------+-----------+------------+-------------+  CFV                    yes   >1 second                            +--------------+---------+------+-----------+------------+-------------+  FV mid        no                                                   +--------------+---------+------+-----------+------------+-------------+  Popliteal    no                                                    +--------------+---------+------+-----------+------------+-------------+  GSV at SFJ              yes    >500 ms      .785                   +--------------+---------+------+-----------+------------+-------------+  GSV prox thighno                            .372                   +--------------+---------+------+-----------+------------+-------------+  GSV mid thigh no                            .  417                   +--------------+---------+------+-----------+------------+-------------+  GSV dist thighno                            .372                   +--------------+---------+------+-----------+------------+-------------+  GSV at knee   no                            .265                   +--------------+---------+------+-----------+------------+-------------+  GSV prox calf                                       out of fascia  +--------------+---------+------+-----------+------------+-------------+  SSV Pop Fossa no                            .171                   +--------------+---------+------+-----------+------------+-------------+  SSV prox calf no                            .152                   +--------------+---------+------+-----------+------------+-------------+     ASSESSMENT/PLAN:: 62 y.o. female presenting with bilateral lower extremity edema which is likely multifactorial.  The patient is obese, with a BMI of 49.  Swelling is appreciated level of the ankle with mild edema in the calf.  Physical exam not consistent with lymphedema.  She wears compression stockings on a regular basis.  Imaging demonstrates focal venous reflux, however this is not diffuse.  We had a long discussion regarding the above.  I think she would benefit most from continued compression stockings and weight loss.  I congratulated her on her current weight loss journey, and told her I am available should any  questions or concerns arise.  No indication for vascular or venous surgery at this time. She can follow-up with my office as needed.   Victorino Sparrow, MD [facility] [phone]

## 2022-12-14 ENCOUNTER — Encounter: Payer: Self-pay | Admitting: Vascular Surgery

## 2022-12-14 ENCOUNTER — Ambulatory Visit (INDEPENDENT_AMBULATORY_CARE_PROVIDER_SITE_OTHER): Payer: 59 | Admitting: Vascular Surgery

## 2022-12-14 VITALS — BP 116/79 | HR 79 | Temp 98.2°F | Resp 20 | Ht 62.0 in | Wt 266.6 lb

## 2022-12-14 DIAGNOSIS — R600 Localized edema: Secondary | ICD-10-CM

## 2023-01-01 NOTE — Progress Notes (Signed)
HPI F never smoker followed for OSA,, Asthma, allergic rhinitis,  lung nodules, complicated by DM2, hx Breast cancer L in remission, HBP HST 01/30/2019- AHI 23.7/ hr, desaturation to 71%, body weight 293 lbs NPSG 07/23/2016- AHI 116.6/ hr, desaturation to 72%   ------------------------------------------------------------------------------------  01/02/22- 60 yoF never smoker followed for  Asthma, lung nodules, Hx OSA (failed CPAP) complicated by DM2, hx Breast cancer L in remission, HBP, GERD, Tremor/ movement disorder/ Neurology,  -Anoro, Singulair, Body weight today-                                       Needs f/u CT for nodules   ?? Covid vax- 2 Ph[employer] -----Pt not currently using cpap machine. -----Follow-up: Cough, SOB, gagging a little. Evaluated by Neurology for possible movement disorder. Coughs more easily now with eating, drinking. Asthma controlled with Anoro, Singulair.  01/03/23- 60 yoF never smoker followed for  Asthma, lung nodules, Hx OSA (failed CPAP) complicated by DM2, hx Breast cancer L in remission, HBP, GERD, Tremor/ movement disorder/ Neurology,  -Anoro, Singulair, albuterol hfa Body weight today-   268 lbs -----Asthma follow up, states DOE only Only aware of DOE, not wheeze or cough during day. Rare use of rescue inhaler. Sometimes throat seems to close and wake her. Discussed hx OSA and reminded to sleep off back, keep weight down. Little phlegm. Agrees to update sleep study. CXR 01/02/22- IMPRESSION: No active cardiopulmonary disease.  ROS-see HPI   + = positive Constitutional:    weight loss, night sweats, fevers, chills, fatigue, lassitude. HEENT:    headaches, +difficulty swallowing, +tooth/dental problems, sore throat,       +sneezing,+ itching,+ ear ache, nasal congestion, post nasal drip, snoring CV:    chest pain, orthopnea, PND, +swelling in lower extremities, anasarca,                                   dizziness, palpitations Resp:   +shortness of breath  with exertion or at rest.                productive cough,   non-productive cough, coughing up of blood.              change in color of mucus.  wheezing.   Skin:    rash or lesions. GI:  No-   heartburn, indigestion, +abdominal pain, nausea, vomiting, diarrhea,                 change in bowel habits, loss of appetite GU: dysuria, change in color of urine, no urgency or frequency.   flank pain. MS:   +joint pain, stiffness, decreased range of motion, back pain. Neuro-     nothing unusual Psych:  change in mood or affect.  depression or +anxiety.   memory loss.  OBJ- Physical Exam General- Alert, Oriented, Affect-appropriate, Distress- none acute, + morbidly obese Skin- rash-none, lesions- none, excoriation- none Lymphadenopathy- none Head- atraumatic            Eyes- Gross vision intact, PERRLA, conjunctivae and secretions clear            Ears- Hearing, canals-normal            Nose- Clear, no-Septal dev, mucus, polyps, erosion, perforation             Throat- Mallampati III ,  mucosa clear , drainage- none, tonsils- atrophic, + teeth Neck- flexible , trachea midline, no stridor , thyroid nl, carotid no bruit Chest - symmetrical excursion , unlabored           Heart/CV- RRR , no murmur , no gallop  , no rub, nl s1 s2                           - JVD- none , edema- none, stasis changes- none, varices- none           Lung- clear to P&A, wheeze- none, cough- none , dullness-none, rub- none           Chest wall- +Scar from port access Abd-  Br/ Gen/ Rectal- Not done, not indicated Extrem- cyanosis- none, clubbing, none, atrophy- none, strength- nl, + cane Neuro- +small twitching movements

## 2023-01-03 ENCOUNTER — Encounter: Payer: Self-pay | Admitting: Internal Medicine

## 2023-01-03 ENCOUNTER — Ambulatory Visit (INDEPENDENT_AMBULATORY_CARE_PROVIDER_SITE_OTHER): Payer: 59 | Admitting: Internal Medicine

## 2023-01-03 ENCOUNTER — Ambulatory Visit: Payer: 59

## 2023-01-03 VITALS — BP 116/58 | HR 90 | Temp 98.3°F | Ht 62.0 in | Wt 268.6 lb

## 2023-01-03 DIAGNOSIS — G4733 Obstructive sleep apnea (adult) (pediatric): Secondary | ICD-10-CM

## 2023-01-03 DIAGNOSIS — J4540 Moderate persistent asthma, uncomplicated: Secondary | ICD-10-CM | POA: Diagnosis not present

## 2023-01-03 DIAGNOSIS — R911 Solitary pulmonary nodule: Secondary | ICD-10-CM

## 2023-01-03 DIAGNOSIS — J4530 Mild persistent asthma, uncomplicated: Secondary | ICD-10-CM | POA: Diagnosis not present

## 2023-01-03 NOTE — Patient Instructions (Signed)
Order- CXR   dx Asthma, history of lung nodules  Order- schedule home sleep test   dx OSA  Please call us for results and recommendations about 2 weeks after your sleep test.

## 2023-01-18 ENCOUNTER — Telehealth: Payer: Self-pay | Admitting: *Deleted

## 2023-01-18 ENCOUNTER — Other Ambulatory Visit: Payer: Self-pay | Admitting: Physical Medicine and Rehabilitation

## 2023-01-18 MED ORDER — GABAPENTIN 300 MG PO CAPS: ORAL_CAPSULE | ORAL | 3 refills | Status: DC

## 2023-01-18 MED ORDER — TRAMADOL HCL 50 MG PO TABS: ORAL_TABLET | ORAL | 5 refills | Status: DC

## 2023-01-18 NOTE — Telephone Encounter (Signed)
Patient has decided to use [facility] as her pharmacy. Paper fax request for new prescription on Tramadol and Gabapentin 300 mg. I called patient to confirm change in pharmacy.

## 2023-01-21 NOTE — Telephone Encounter (Signed)
noted 

## 2023-01-22 ENCOUNTER — Encounter: Payer: Self-pay | Admitting: Internal Medicine

## 2023-01-22 ENCOUNTER — Ambulatory Visit: Payer: 59 | Admitting: Podiatry

## 2023-01-22 DIAGNOSIS — B351 Tinea unguium: Secondary | ICD-10-CM | POA: Diagnosis not present

## 2023-01-22 DIAGNOSIS — M79674 Pain in right toe(s): Secondary | ICD-10-CM

## 2023-01-22 DIAGNOSIS — M79675 Pain in left toe(s): Secondary | ICD-10-CM

## 2023-01-22 DIAGNOSIS — E1149 Type 2 diabetes mellitus with other diabetic neurological complication: Secondary | ICD-10-CM

## 2023-01-22 DIAGNOSIS — I872 Venous insufficiency (chronic) (peripheral): Secondary | ICD-10-CM

## 2023-01-22 NOTE — Assessment & Plan Note (Signed)
Mild intermittent uncomplicated.  Plan- CXR. Keep meds available.

## 2023-01-22 NOTE — Assessment & Plan Note (Signed)
Occ wakes with "throat closing". Plan- agrees to update sleep study.

## 2023-01-22 NOTE — Progress Notes (Signed)
Subjective:  Chief Complaint  Patient presents with   Nail Problem    Pt present today for a diabetic foot care.pt denies pain.    62 year old female presents with the above concerns.  She presents today to have her nails trimmed there is a thick elongated she can identify self.  No swelling redness or drainage.  I contacted the MRI results and she wants to do therapy on her own with aquatic therapy but she has not been to the pool yet.  She states that her ankles/feet are "fine".   Objective: AAO x3, NAD DP/PT pulses palpable bilaterally, CRT less than 3 seconds There is mild global tenderness around the ankle on both medial, lateral aspects, Achilles tendon bilaterally with minimal edema.  There is no area of pinpoint tenderness seen most of it seems, soft tissue.   Nails are hypertrophic, dystrophic, brittle, discolored, elongated 10. No surrounding redness or drainage. Tenderness nails 1-5 bilaterally. No open lesions or pre-ulcerative lesions are identified today. There is no other areas of discomfort noted today. No pain with calf compression, swelling, warmth, erythema  Assessment: Chronic ankle pain, symptomatic onychomycosis  Plan: -All treatment options discussed with the patient including all alternatives, risks, complications.  -Debrided nails x 10 without any complications or bleeding. -Discussed referral to physical therapy but she wants to hold off on this for now.  She want to try to do aquatic therapy on her own.  Continue shoes, good support.  Return in about 3 months (around 04/24/2023) for nail trim/ ankle pain.  Vivi Barrack DPM

## 2023-02-13 ENCOUNTER — Encounter (INDEPENDENT_AMBULATORY_CARE_PROVIDER_SITE_OTHER): Payer: 59

## 2023-02-13 DIAGNOSIS — R0683 Snoring: Secondary | ICD-10-CM

## 2023-02-13 DIAGNOSIS — G4733 Obstructive sleep apnea (adult) (pediatric): Secondary | ICD-10-CM

## 2023-02-25 ENCOUNTER — Encounter: Payer: 59 | Attending: Registered Nurse | Admitting: Registered Nurse

## 2023-02-25 ENCOUNTER — Encounter: Payer: Self-pay | Admitting: Registered Nurse

## 2023-02-25 VITALS — BP 115/75 | HR 91 | Ht 62.0 in | Wt 268.0 lb

## 2023-02-25 DIAGNOSIS — Z5181 Encounter for therapeutic drug level monitoring: Secondary | ICD-10-CM

## 2023-02-25 DIAGNOSIS — M546 Pain in thoracic spine: Secondary | ICD-10-CM | POA: Insufficient documentation

## 2023-02-25 DIAGNOSIS — Z79891 Long term (current) use of opiate analgesic: Secondary | ICD-10-CM | POA: Diagnosis not present

## 2023-02-25 DIAGNOSIS — M1712 Unilateral primary osteoarthritis, left knee: Secondary | ICD-10-CM | POA: Diagnosis present

## 2023-02-25 DIAGNOSIS — G8929 Other chronic pain: Secondary | ICD-10-CM | POA: Diagnosis present

## 2023-02-25 DIAGNOSIS — G894 Chronic pain syndrome: Secondary | ICD-10-CM

## 2023-02-25 DIAGNOSIS — M797 Fibromyalgia: Secondary | ICD-10-CM

## 2023-02-25 DIAGNOSIS — M1711 Unilateral primary osteoarthritis, right knee: Secondary | ICD-10-CM | POA: Diagnosis present

## 2023-02-25 DIAGNOSIS — M47817 Spondylosis without myelopathy or radiculopathy, lumbosacral region: Secondary | ICD-10-CM | POA: Diagnosis present

## 2023-02-25 MED ORDER — TRAMADOL HCL 50 MG PO TABS: ORAL_TABLET | ORAL | 5 refills | Status: DC

## 2023-02-25 NOTE — Progress Notes (Addendum)
Subjective:    Patient ID: Kelsey Wilson, female    DOB: 05-08-1961, 62 y.o.   MRN: [id number]  HPI: Kelsey Wilson is a 62 y.o. female who returns for follow up appointment for chronic pain and medication refill. She states  her pain is located in her mid- lower back pain and bilateral ankle and bilateral feet pain. She rates her pain 7. Her current exercise regime is walking and performing stretching exercises.  Ms. Warburton Morphine equivalent is 48.00 MME She is also prescribed Alprazolam  by  Boone Master NP .We have discussed the black box warning of using opioids and benzodiazepines. I highlighted the dangers of using these drugs together and discussed the adverse events including respiratory suppression, overdose, cognitive impairment and importance of compliance with current regimen. We will continue to monitor and adjust as indicated.      Last UDS was Performed on 08/08/2022, it was consistent.    Pain Inventory Average Pain 8 Pain Right Now 7 My pain is aching  In the last 24 hours, has pain interfered with the following? General activity 3 Relation with others 3 Enjoyment of life 3 What TIME of day is your pain at its worst? morning  Sleep (in general) Fair  Pain is worse with: walking, bending, sitting, inactivity, and standing Pain improves with: rest, therapy/exercise, and medication Relief from Meds: 4  Family History  Problem Relation Age of Onset   Hypertension Mother    Diabetes Mother    Hypertension Father    Diabetes Father    Hypertension Sister    Hyperlipidemia Brother    Breast cancer Paternal Aunt    Cancer Paternal Grandmother        unknown   Colon cancer Neg Hx    Parkinson's disease Neg Hx    Social History   Socioeconomic History   Marital status: Single    Spouse name: Not on file   Number of children: 3   Years of education: Not on file   Highest education level: Not on file  Occupational History   Not on file  Tobacco Use    Smoking status: Never   Smokeless tobacco: Never  Vaping Use   Vaping status: Never Used  Substance and Sexual Activity   Alcohol use: No    Alcohol/week: 0.0 standard drinks of alcohol   Drug use: No   Sexual activity: Never    Birth control/protection: Post-menopausal, Surgical  Other Topics Concern   Not on file  Social History Narrative   Not on file   Social Determinants of Health   Financial Resource Strain: Not on file  Food Insecurity: Not on file  Transportation Needs: Not on file  Physical Activity: Not on file  Stress: Not on file  Social Connections: Unknown (10/16/2021)   [facility]   Social Network    Social Network: Not on file   Past Surgical History:  Procedure Laterality Date   ABDOMINAL HYSTERECTOMY     still has ovaries   APPENDECTOMY     AXILLARY LYMPH NODE DISSECTION  11/28/2011   Procedure: AXILLARY LYMPH NODE DISSECTION;  Surgeon: Mariella Saa, MD;  Location: [facility] OR;  Service: General;  Laterality: Left;   BREAST LUMPECTOMY Left 11/28/2011   Malignant   BREAST SURGERY Left 2013   CARPAL TUNNEL RELEASE     Bilateral   CESAREAN SECTION     pt. has had 3   CHOLECYSTECTOMY  COLONOSCOPY     COLONOSCOPY WITH PROPOFOL N/A 01/24/2021   Procedure: COLONOSCOPY WITH PROPOFOL;  Surgeon: Sherrilyn Rist, MD;  Location: [facility];  Service: Gastroenterology;  Laterality: N/A;   ESOPHAGOGASTRODUODENOSCOPY (EGD) WITH PROPOFOL N/A 01/24/2021   Procedure: ESOPHAGOGASTRODUODENOSCOPY (EGD) WITH PROPOFOL;  Surgeon: Sherrilyn Rist, MD;  Location: [facility];  Service: Gastroenterology;  Laterality: N/A;   EYE SURGERY Bilateral    cataract removal   KNEE SURGERY     Left Knee   LAMINECTOMY WITH POSTERIOR LATERAL ARTHRODESIS LEVEL 1 N/A 11/29/2017   Procedure: Posterior Lateral Fusion - Lumbar Four-Lumbar Five, removal and replacement of hardware, Laminectomy - Lumbar Four-Lumbar Five;  Surgeon: Tia Alert, MD;   Location: [facility] OR;  Service: Neurosurgery;  Laterality: N/A;   LAMINECTOMY WITH POSTERIOR LATERAL ARTHRODESIS LEVEL 2 N/A 06/07/2017   Procedure: Posterior Lateral Fusion Lumbar Three-Four and Transforaminal Interbody Fusion Lumbar Four-Five with Segmental  Pedicle Screw Fixation;  Surgeon: Tia Alert, MD;  Location: [facility] OR;  Service: Neurosurgery;  Laterality: N/A;  Posterior Lateral Fusion Lumbar Three-Four and Transforaminal Interbody Fusion Lumbar Four-Five with Segmental  Pedicle Screw Fixation    LUMBAR FUSION  06/07/2017   POST   LUMBAR LAMINECTOMY/DECOMPRESSION MICRODISCECTOMY Bilateral 01/27/2016   Procedure: Laminectomy and Foraminotomy - Lumbar four -Lumbar five - bilateral- on-lay noninstrumented fusion;  Surgeon: Tia Alert, MD;  Location: [facility];  Service: Neurosurgery;  Laterality: Bilateral;   MULTIPLE EXTRACTIONS WITH ALVEOLOPLASTY N/A 05/11/2016   Procedure: EXTRACTION OF TEETH EIGHTEEN, TWENTY AND TWENTY- NINE;  REMOVAL OF MANDIBULAR TORUS AND EXOSTOSIS;  Surgeon: Ocie Doyne, DDS;  Location: [facility];  Service: Oral Surgery;  Laterality: N/A;   POLYPECTOMY  01/24/2021   Procedure: POLYPECTOMY;  Surgeon: Sherrilyn Rist, MD;  Location: [facility];  Service: Gastroenterology;;   PORTACATH PLACEMENT  06/18/2011   Procedure: INSERTION PORT-A-CATH;  Surgeon: Mariella Saa, MD;  Location: [facility];  Service: General;  Laterality: Right;  right subclavian   removal portacath  2014   spur     Apex spur on both big toes   TOE SURGERY Bilateral    TONSILLECTOMY     TOTAL KNEE ARTHROPLASTY Left 09/13/2014   TOTAL KNEE ARTHROPLASTY Left 09/13/2014   Procedure: LEFT TOTAL KNEE ARTHROPLASTY;  Surgeon: Samson Frederic, MD;  Location: [facility];  Service: Orthopedics;  Laterality: Left;   TOTAL KNEE ARTHROPLASTY Right 03/10/2015   Procedure: RIGHT TOTAL KNEE ARTHROPLASTY;  Surgeon: Samson Frederic, MD;  Location: [facility];  Service: Orthopedics;  Laterality: Right;    Past Surgical History:  Procedure Laterality Date   ABDOMINAL HYSTERECTOMY     still has ovaries   APPENDECTOMY     AXILLARY LYMPH NODE DISSECTION  11/28/2011   Procedure: AXILLARY LYMPH NODE DISSECTION;  Surgeon: Mariella Saa, MD;  [facility]: [facility];  Service: General;  Laterality: Left;   BREAST LUMPECTOMY Left 11/28/2011   Malignant   BREAST SURGERY Left 2013   CARPAL TUNNEL RELEASE     Bilateral   CESAREAN SECTION     pt. has had 3   CHOLECYSTECTOMY     COLONOSCOPY     COLONOSCOPY WITH PROPOFOL N/A 01/24/2021   Procedure: COLONOSCOPY WITH PROPOFOL;  Surgeon: Sherrilyn Rist, MD;  [facility];  Service: Gastroenterology;  Laterality: N/A;   ESOPHAGOGASTRODUODENOSCOPY (EGD) WITH PROPOFOL N/A 01/24/2021   Procedure: ESOPHAGOGASTRODUODENOSCOPY (EGD) WITH PROPOFOL;  Surgeon: Sherrilyn Rist, MD;  Location: [facility];  Service: Gastroenterology;  Laterality: N/A;   EYE SURGERY Bilateral    cataract removal   KNEE SURGERY     Left Knee   LAMINECTOMY WITH POSTERIOR LATERAL ARTHRODESIS LEVEL 1 N/A 11/29/2017   Procedure: Posterior Lateral Fusion - Lumbar Four-Lumbar Five, removal and replacement of hardware, Laminectomy - Lumbar Four-Lumbar Five;  Surgeon: Tia Alert, MD;  Location: [facility] OR;  Service: Neurosurgery;  Laterality: N/A;   LAMINECTOMY WITH POSTERIOR LATERAL ARTHRODESIS LEVEL 2 N/A 06/07/2017   Procedure: Posterior Lateral Fusion Lumbar Three-Four and Transforaminal Interbody Fusion Lumbar Four-Five with Segmental  Pedicle Screw Fixation;  Surgeon: Tia Alert, MD;  Location: [facility] OR;  Service: Neurosurgery;  Laterality: N/A;  Posterior Lateral Fusion Lumbar Three-Four and Transforaminal Interbody Fusion Lumbar Four-Five with Segmental  Pedicle Screw Fixation    LUMBAR FUSION  06/07/2017   POST   LUMBAR LAMINECTOMY/DECOMPRESSION MICRODISCECTOMY Bilateral 01/27/2016   Procedure: Laminectomy and Foraminotomy - Lumbar four -Lumbar five - bilateral- on-lay  noninstrumented fusion;  Surgeon: Tia Alert, MD;  Location: [facility];  Service: Neurosurgery;  Laterality: Bilateral;   MULTIPLE EXTRACTIONS WITH ALVEOLOPLASTY N/A 05/11/2016   Procedure: EXTRACTION OF TEETH EIGHTEEN, TWENTY AND TWENTY- NINE;  REMOVAL OF MANDIBULAR TORUS AND EXOSTOSIS;  Surgeon: Ocie Doyne, DDS;  Location: [facility];  Service: Oral Surgery;  Laterality: N/A;   POLYPECTOMY  01/24/2021   Procedure: POLYPECTOMY;  Surgeon: Sherrilyn Rist, MD;  Location: [facility];  Service: Gastroenterology;;   PORTACATH PLACEMENT  06/18/2011   Procedure: INSERTION PORT-A-CATH;  Surgeon: Mariella Saa, MD;  Location: [facility];  Service: General;  Laterality: Right;  right subclavian   removal portacath  2014   spur     Apex spur on both big toes   TOE SURGERY Bilateral    TONSILLECTOMY     TOTAL KNEE ARTHROPLASTY Left 09/13/2014   TOTAL KNEE ARTHROPLASTY Left 09/13/2014   Procedure: LEFT TOTAL KNEE ARTHROPLASTY;  Surgeon: Samson Frederic, MD;  Location: [facility];  Service: Orthopedics;  Laterality: Left;   TOTAL KNEE ARTHROPLASTY Right 03/10/2015   Procedure: RIGHT TOTAL KNEE ARTHROPLASTY;  Surgeon: Samson Frederic, MD;  Location: [facility];  Service: Orthopedics;  Laterality: Right;   Past Medical History:  Diagnosis Date   Anemia    Anxiety    Arthritis    Breast cancer (HCC) 2010   T3N1 invasive ductal carcinoma left breast.Takes Arimidex daily   Bursitis    Carpal tunnel syndrome    Chronic back pain    stenosis   Constipation    takes Colace daily   Depression    takes Benzotropine daily   Diverticulitis of colon    Dyspnea    daily when walking for over 1 yr.   Fibromyalgia 08/2012   GERD (gastroesophageal reflux disease)    takes Dexilant daily   Hemorrhoid    History of blood transfusion    no abnormal reaction noted   History of colon polyps    benign   History of shingles    Joint pain    Joint swelling    Morbid obesity (HCC)    Night  muscle spasms    takes Flexeril nightly as needed   Nocturia    OSA (obstructive sleep apnea)    OSA on CPAP    Peripheral edema    takes Furosemide.Just started 01/18/16   Peripheral neuropathy    takes Lyrica daily   Personal history of chemotherapy    2013  Personal history of radiation therapy    2013   Pneumonia    hx of-2015   Pseudoarthrosis of lumbar spine    Seasonal allergies    takes Singulair nightly   SOB (shortness of breath) on exertion    rarely with exertion   Splenorenal shunt malfunction (HCC)    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 Midge Aver)   Type II diabetes mellitus (HCC)    Ht 5\' 2"  (1.575 m)   BMI 49.13 kg/m   Opioid Risk Score:   Fall Risk Score:  `1  Depression screen [facility] 2/9     08/08/2022   10:48 AM 08/04/2018   10:45 AM 06/06/2018    9:30 AM 04/04/2018   10:03 AM 01/13/2018   10:08 AM 12/13/2017    9:46 AM 06/21/2017   10:44 AM  Depression screen PHQ 2/9  Decreased Interest 0 0 0 0 3 3 1   Down, Depressed, Hopeless 0 0 0 0 3 3 1   PHQ - 2 Score 0 0 0 0 6 6 2       Review of Systems  Musculoskeletal:  Positive for back pain.       B/L hand pain Right knee pain B/L ankle pain B/L toe pain B/L inner thigh pain   All other systems reviewed and are negative.      Objective:   Physical Exam Vitals and nursing note reviewed.  Constitutional:      Appearance: Normal appearance. She is obese.  Cardiovascular:     Rate and Rhythm: Normal rate and regular rhythm.     Pulses: Normal pulses.     Heart sounds: Normal heart sounds.  Pulmonary:     Effort: Pulmonary effort is normal.     Breath sounds: Normal breath sounds.  Musculoskeletal:     Cervical back: Normal range of motion and neck supple.     Comments: Normal Muscle Bulk and Muscle Testing Reveals:  Upper Extremities: Full ROM and Muscle Strength 5/5  Thoracic Paraspinal Tenderness: T-4-T-6 Mainly  Right Side  Lumbar Paraspinal Tenderness: L-4-L-5 Lower Extremities: Full ROM and Muscle Strength 5/5 Arises from chair slowly Antalgic  Gait     Skin:    General: Skin is warm and dry.  Neurological:     Mental Status: She is alert and oriented to person, place, and time.  Psychiatric:        Mood and Affect: Mood normal.        Behavior: Behavior normal.           Assessment & Plan:  1. Chronic Sacroiliac Joint Pain: S/P Bilateral Sacroilliac Injection with Dr. Wynn Banker on 06/23/2019. With good relief noted. 02/25/2023. 2.. Lumbar Postlaminectomy/ S/P Lumbar Fusion:Spinal Stenosis:S/P Posterior Lateral Fusion L-4-L-5 removal and replacement of hardware, Laminectomy L4-L5 by Dr. Yetta Barre on 05/22/2018. Continue HEP as Tolerated and Continue current medication regimen. 02/25/2023   Refill:  Tramadol (50 mg) Take one- two tablets three times a day as needed for pain #180   We will continue the opioid monitoring program, this consists of regular clinic visits, examinations, urine drug screen, pill counts as well as use of [place] Controlled Substance Reporting system. A 12 month History has been reviewed on the [place] Controlled Substance Reporting System on 02/25/2023. 3. Lumbar Radiculitis: No complaints today. Continue current medication regime with Gabapentin. 02/25/2023 4. Fibromyalgia: Continue HEP as Tolerated. Continue Current Medication Regimen with Gabapentin. 02/25/2023 5. Bilateral Greater  Trochanter Bursitis: Continue to Alternate Ice and Heat Therapy.Continue to monitor.  09/232024. 6. Muscle Spasm: Continue: Flexeril. Continue to Monitor. 02/25/2023. 7. Chronic Pain Syndrome: Continue Current medication regimen and HEP as Tolerated. Continue to Monitor. 02/25/2023.  8. Bilateral OA of Bilateral Knees: Orthopedist following.  Continue to Monitor. 02/25/2023 9. Bilateral Hand Pain: No complaints today. Continue to Monitor/ ? OA: Continue to Monitor.  02/25/2023 10. Cervicalgia: No complaints Today. Continue to alternate Heat/Ice Therapy. Continue HEP as Tolerated. 02/25/2023. 11. Chronic Bilateral Shoulders: No complaints today. Continue to alternate Ice/ Heat therapy and HEP as Tolerated. 02/25/2023.  12. Bilateral Ankle Pain: Podiatry Following: Continue to Monitor. 02/25/2023   13. Bilateral Fingers with Neuropathic Pain: Contin Gabapentin. Educated on following diabetic diet and tight blood sugar control, she verbalizes understanding. 02/25/2023   F/U in 6 months

## 2023-03-11 ENCOUNTER — Telehealth: Payer: Self-pay | Admitting: Specialist

## 2023-03-11 NOTE — Telephone Encounter (Signed)
Pam from [employer] called. Call back at [phone] Order for tramadol placed.  Tramadol has interaction with several other medications patient is currently taking.  Is the MD aware and ok with this? Please call back.

## 2023-03-11 NOTE — Telephone Encounter (Signed)
Return [facility] called.  Pharmacy Technician reports Kelsey Wilson is prescribed Duloxetine byTequila Lauro Regulus she is also prescribed Effexor last fill date was on 02/28/2023., doesn't see the pharmacy or prescriber. This provider spoke with pharmacist regarding the above, and she states the same as above. This provider will call [name] regarding the above,

## 2023-03-12 NOTE — Telephone Encounter (Signed)
Call Placed to Ms. Grams. Explained to her what the pharmacist stated at [facility], she's stated she's not home currently. She will call thus provider with the above information on tomorrow.

## 2023-03-13 ENCOUNTER — Telehealth: Payer: Self-pay | Admitting: Specialist

## 2023-03-13 NOTE — Telephone Encounter (Signed)
Patient of Riley Lam, needs medication refilled did not specify which, please call. Shirlean Mylar, MHA, OT/L [phone]

## 2023-03-15 NOTE — Telephone Encounter (Signed)
Return Kelsey Wilson call,  Shje reports Dr Ellyn Hack prescribed the effexor for her menopausal symptoms, she also states she will be calling Dr Ellyn Hack today, due to having some reactions to the medicine.  She also states she has been on Cymbalta for years. She currently has Tramadol and have been taking her Tramadol with the Cymbalta and Effexor and hasn't known any reactions.  She will send this provider a my-chart message  after speaking with Dr Ellyn Hack.

## 2023-04-03 NOTE — Progress Notes (Signed)
HPI F never smoker followed for OSA,, Asthma, allergic rhinitis,  lung nodules, complicated by DM2, hx Breast cancer L in remission, HBP HST 01/30/2019- AHI 23.7/ hr, desaturation to 71%, body weight 293 lbs NPSG 07/23/2016- AHI 116.6/ hr, desaturation to 72% HST 01/09/23- AHI 15.3/hr, desat to 82%, body weight 268 lbs  ------------------------------------------------------------------------------------   01/03/23- 60 yoF never smoker followed for  Asthma, lung nodules, Hx OSA (failed CPAP) complicated by DM2, hx Breast cancer L in remission, HBP, GERD, Tremor/ movement disorder/ Neurology,  -Anoro, Singulair, albuterol hfa Body weight today-   268 lbs -----Asthma follow up, states DOE only Only aware of DOE, not wheeze or cough during day. Rare use of rescue inhaler. Sometimes throat seems to close and wake her. Discussed hx OSA and reminded to sleep off back, keep weight down. Little phlegm. Agrees to update sleep study. CXR 01/02/22- IMPRESSION: No active cardiopulmonary disease.  04/05/23- 62 yoF never smoker followed for  Asthma, lung nodules, Hx OSA (failed CPAP) complicated by DM2, hx Breast cancer L in remission, HBP, GERD, Tremor/ movement disorder/ Neurology, Prurigo Nodularis,  -Anoro, Singulair, albuterol hfa HST 01/09/23- AHI 15.3/hr, desat to 82%, body weight 268 lbs Body weight today-   264 lbs For treatment decision Didn't like CPAP before- loud and would fall asleep before putting it on. Now pending dental partial plate, so OAP not an option. Had flu vax. CXR 01/09/23- IMPRESSION: No acute cardiopulmonary disease.  ROS-see HPI   + = positive Constitutional:    weight loss, night sweats, fevers, chills, fatigue, lassitude. HEENT:    headaches, +difficulty swallowing, +tooth/dental problems, sore throat,       +sneezing,+ itching,+ ear ache, nasal congestion, post nasal drip, snoring CV:    chest pain, orthopnea, PND, +swelling in lower extremities, anasarca,                                    dizziness, palpitations Resp:   +shortness of breath with exertion or at rest.                productive cough,   non-productive cough, coughing up of blood.              change in color of mucus.  wheezing.   Skin:    rash or lesions. GI:  No-   heartburn, indigestion, +abdominal pain, nausea, vomiting, diarrhea,                 change in bowel habits, loss of appetite GU: dysuria, change in color of urine, no urgency or frequency.   flank pain. MS:   +joint pain, stiffness, decreased range of motion, back pain. Neuro-     nothing unusual Psych:  change in mood or affect.  depression or +anxiety.   memory loss.  OBJ- Physical Exam General- Alert, Oriented, Affect-appropriate/ ?drowsy, Distress- none acute, + morbidly obese Skin- rash-none, lesions- none, excoriation- none Lymphadenopathy- none Head- atraumatic            Eyes- Gross vision intact, PERRLA, conjunctivae and secretions clear            Ears- Hearing, canals-normal            Nose- Clear, no-Septal dev, mucus, polyps, erosion, perforation             Throat- Mallampati III , mucosa clear , drainage- none, tonsils- atrophic, + teeth Neck- flexible , trachea  midline, no stridor , thyroid nl, carotid no bruit Chest - symmetrical excursion , unlabored           Heart/CV- RRR , no murmur , no gallop  , no rub, nl s1 s2                           - JVD- none , edema- none, stasis changes- none, varices- none           Lung- clear to P&A, wheeze- none, cough- none , dullness-none, rub- none           Chest wall- +Scar from port access Abd-  Br/ Gen/ Rectal- Not done, not indicated Extrem- cyanosis- none, clubbing, none, atrophy- none, strength- nl, + cane Neuro- +small twitching movements

## 2023-04-05 ENCOUNTER — Encounter: Payer: Self-pay | Admitting: Internal Medicine

## 2023-04-05 ENCOUNTER — Ambulatory Visit (INDEPENDENT_AMBULATORY_CARE_PROVIDER_SITE_OTHER): Payer: 59 | Admitting: Internal Medicine

## 2023-04-05 VITALS — BP 97/62 | HR 88 | Ht 62.0 in | Wt 264.2 lb

## 2023-04-05 DIAGNOSIS — G4733 Obstructive sleep apnea (adult) (pediatric): Secondary | ICD-10-CM | POA: Diagnosis not present

## 2023-04-05 DIAGNOSIS — R911 Solitary pulmonary nodule: Secondary | ICD-10-CM

## 2023-04-05 NOTE — Patient Instructions (Signed)
Order- new DME, new CPAP auto 5-20, mask of choice, humidifier, supplies, AirView/ card  Please call if we can help 

## 2023-04-08 ENCOUNTER — Encounter: Payer: Self-pay | Admitting: Internal Medicine

## 2023-04-08 ENCOUNTER — Ambulatory Visit (INDEPENDENT_AMBULATORY_CARE_PROVIDER_SITE_OTHER): Payer: 59 | Admitting: Internal Medicine

## 2023-04-08 VITALS — BP 124/82 | HR 94 | Ht 62.0 in | Wt 266.0 lb

## 2023-04-08 DIAGNOSIS — E119 Type 2 diabetes mellitus without complications: Secondary | ICD-10-CM

## 2023-04-08 DIAGNOSIS — Z7985 Long-term (current) use of injectable non-insulin antidiabetic drugs: Secondary | ICD-10-CM | POA: Diagnosis not present

## 2023-04-08 DIAGNOSIS — E1142 Type 2 diabetes mellitus with diabetic polyneuropathy: Secondary | ICD-10-CM | POA: Diagnosis not present

## 2023-04-08 DIAGNOSIS — G620 Drug-induced polyneuropathy: Secondary | ICD-10-CM | POA: Insufficient documentation

## 2023-04-08 LAB — POCT GLYCOSYLATED HEMOGLOBIN (HGB A1C): Hemoglobin A1C: 5.8 % — AB (ref 4.0–5.6)

## 2023-04-08 LAB — POCT GLUCOSE (DEVICE FOR HOME USE): POC Glucose: 91 mg/dL (ref 70–99)

## 2023-04-08 MED ORDER — MOUNJARO 7.5 MG/0.5ML ~~LOC~~ SOAJ: 7.5000 mg | SUBCUTANEOUS | 3 refills | Status: DC

## 2023-04-08 NOTE — Patient Instructions (Signed)
Stop Glipizide  Continue  Mounjro 7.5 mg weekly     HOW TO TREAT LOW BLOOD SUGARS (Blood sugar LESS THAN 70 MG/DL) Please follow the RULE OF 15 for the treatment of hypoglycemia treatment (when your (blood sugars are less than 70 mg/dL)   STEP 1: Take 15 grams of carbohydrates when your blood sugar is low, which includes:  3-4 GLUCOSE TABS  OR 3-4 OZ OF JUICE OR REGULAR SODA OR ONE TUBE OF GLUCOSE GEL    STEP 2: RECHECK blood sugar in 15 MINUTES STEP 3: If your blood sugar is still low at the 15 minute recheck --> then, go back to STEP 1 and treat AGAIN with another 15 grams of carbohydrates.

## 2023-04-08 NOTE — Progress Notes (Unsigned)
Name: Kelsey Wilson  MRN/ DOB: [id number], 1960/06/23   Age/ Sex: 62 y.o., female    PCP: Sarita Bottom, NP   Reason for Endocrinology Evaluation: Type 2 Diabetes Mellitus     Date of Initial Endocrinology Visit: 04/08/2023     PATIENT IDENTIFIER: Kelsey Wilson is a 62 y.o. female with a past medical history of DM, Hx Breast Ca  . The patient presented for initial endocrinology clinic visit on 04/08/2023 for consultative assistance with her diabetes management.    HPI: Kelsey Wilson was    Diagnosed with DM yrs ago  Prior Medications tried/Intolerance: Metformin- GI side effects. Trulicity- no intolerance  Currently checking blood sugars 0 x / day  Hemoglobin A1c has ranged from 5.8% in 2024  In terms of diet, the patient eats 1-2  meals a day snacks occasionally. Drinks sweet tea and pepsi   Denies nausea or vomiting  Denies constipations or diarrhea   She is mainly c/o tingling of the hands and fingers that was diagnosed due to chemotherapy    HOME DIABETES REGIMEN: Glipizide  5 mg daily  Mounjaro 7.5 mg weekly    Statin: yes ACE-I/ARB: yes    METER DOWNLOAD SUMMARY: n/a    DIABETIC COMPLICATIONS: Microvascular complications:  Neuropathy- chemo induced  Denies: CKD, retinopathy Last eye exam: Completed 2023  Macrovascular complications:   Denies: CAD, PVD, CVA   PAST HISTORY: Past Medical History:  Past Medical History:  Diagnosis Date   Anemia    Anxiety    Arthritis    Breast cancer (HCC) 2010   T3N1 invasive ductal carcinoma left breast.Takes Arimidex daily   Bursitis    Carpal tunnel syndrome    Chronic back pain    stenosis   Constipation    takes Colace daily   Depression    takes Benzotropine daily   Diverticulitis of colon    Dyspnea    daily when walking for over 1 yr.   Fibromyalgia 08/2012   GERD (gastroesophageal reflux disease)    takes Dexilant daily   Hemorrhoid    History of blood transfusion    no abnormal  reaction noted   History of colon polyps    benign   History of shingles    Joint pain    Joint swelling    Morbid obesity (HCC)    Night muscle spasms    takes Flexeril nightly as needed   Nocturia    OSA (obstructive sleep apnea)    OSA on CPAP    Peripheral edema    takes Furosemide.Just started 01/18/16   Peripheral neuropathy    takes Lyrica daily   Personal history of chemotherapy    2013   Personal history of radiation therapy    2013   Pneumonia    hx of-2015   Pseudoarthrosis of lumbar spine    Seasonal allergies    takes Singulair nightly   SOB (shortness of breath) on exertion    rarely with exertion   Splenorenal shunt malfunction (HCC)    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 Midge Aver)   Type II diabetes mellitus (HCC)    Past Surgical History:  Past Surgical History:  Procedure Laterality Date   ABDOMINAL HYSTERECTOMY     still has ovaries   APPENDECTOMY     AXILLARY LYMPH NODE DISSECTION  11/28/2011   Procedure: AXILLARY LYMPH NODE DISSECTION;  Surgeon: Mariella Saa, MD;  Location: [facility];  Service: General;  Laterality: Left;   BREAST LUMPECTOMY Left 11/28/2011   Malignant   BREAST SURGERY Left 2013   CARPAL TUNNEL RELEASE     Bilateral   CESAREAN SECTION     pt. has had 3   CHOLECYSTECTOMY     COLONOSCOPY     COLONOSCOPY WITH PROPOFOL N/A 01/24/2021   Procedure: COLONOSCOPY WITH PROPOFOL;  Surgeon: Sherrilyn Rist, MD;  Location: [facility];  Service: Gastroenterology;  Laterality: N/A;   ESOPHAGOGASTRODUODENOSCOPY (EGD) WITH PROPOFOL N/A 01/24/2021   Procedure: ESOPHAGOGASTRODUODENOSCOPY (EGD) WITH PROPOFOL;  Surgeon: Sherrilyn Rist, MD;  Location: [facility];  Service: Gastroenterology;  Laterality: N/A;   EYE SURGERY Bilateral    cataract removal   KNEE SURGERY     Left Knee   LAMINECTOMY WITH POSTERIOR LATERAL ARTHRODESIS LEVEL 1 N/A 11/29/2017    Procedure: Posterior Lateral Fusion - Lumbar Four-Lumbar Five, removal and replacement of hardware, Laminectomy - Lumbar Four-Lumbar Five;  Surgeon: Tia Alert, MD;  Location: [facility] OR;  Service: Neurosurgery;  Laterality: N/A;   LAMINECTOMY WITH POSTERIOR LATERAL ARTHRODESIS LEVEL 2 N/A 06/07/2017   Procedure: Posterior Lateral Fusion Lumbar Three-Four and Transforaminal Interbody Fusion Lumbar Four-Five with Segmental  Pedicle Screw Fixation;  Surgeon: Tia Alert, MD;  Location: [facility] OR;  Service: Neurosurgery;  Laterality: N/A;  Posterior Lateral Fusion Lumbar Three-Four and Transforaminal Interbody Fusion Lumbar Four-Five with Segmental  Pedicle Screw Fixation    LUMBAR FUSION  06/07/2017   POST   LUMBAR LAMINECTOMY/DECOMPRESSION MICRODISCECTOMY Bilateral 01/27/2016   Procedure: Laminectomy and Foraminotomy - Lumbar four -Lumbar five - bilateral- on-lay noninstrumented fusion;  Surgeon: Tia Alert, MD;  Location: [facility];  Service: Neurosurgery;  Laterality: Bilateral;   MULTIPLE EXTRACTIONS WITH ALVEOLOPLASTY N/A 05/11/2016   Procedure: EXTRACTION OF TEETH EIGHTEEN, TWENTY AND TWENTY- NINE;  REMOVAL OF MANDIBULAR TORUS AND EXOSTOSIS;  Surgeon: Ocie Doyne, DDS;  Location: [facility] OR;  Service: Oral Surgery;  Laterality: N/A;   POLYPECTOMY  01/24/2021   Procedure: POLYPECTOMY;  Surgeon: Sherrilyn Rist, MD;  Location: [facility];  Service: Gastroenterology;;   PORTACATH PLACEMENT  06/18/2011   Procedure: INSERTION PORT-A-CATH;  Surgeon: Mariella Saa, MD;  Location: [facility];  Service: General;  Laterality: Right;  right subclavian   removal portacath  2014   spur     Apex spur on both big toes   TOE SURGERY Bilateral    TONSILLECTOMY     TOTAL KNEE ARTHROPLASTY Left 09/13/2014   TOTAL KNEE ARTHROPLASTY Left 09/13/2014   Procedure: LEFT TOTAL KNEE ARTHROPLASTY;  Surgeon: Samson Frederic, MD;  Location: [facility] OR;  Service: Orthopedics;  Laterality: Left;   TOTAL KNEE  ARTHROPLASTY Right 03/10/2015   Procedure: RIGHT TOTAL KNEE ARTHROPLASTY;  Surgeon: Samson Frederic, MD;  Location: [facility];  Service: Orthopedics;  Laterality: Right;    Social History:  reports that she has never smoked. She has never used smokeless tobacco. She reports that she does not drink alcohol and does not use drugs. Family History:  Family History  Problem Relation Age of Onset   Hypertension Mother    Diabetes Mother    Hypertension Father    Diabetes Father    Hypertension Sister    Hyperlipidemia Brother    Breast cancer Paternal Aunt    Cancer Paternal Grandmother        unknown   Colon cancer Neg Hx    Parkinson's disease Neg Hx  HOME MEDICATIONS: Allergies as of 04/08/2023       Reactions   Other Other (See Comments)   Salicylates    Compazine [prochlorperazine] Other (See Comments)   Numbness of face and  lips   Hydrocodone Itching   High dose only   Naproxen Nausea Only, Other (See Comments)   Oxycodone Other (See Comments)   hallucinations   Penicillins Nausea Only, Other (See Comments)   Has patient had a PCN reaction causing immediate rash, facial/tongue/throat swelling, SOB or lightheadedness with hypotension: No Has patient had a PCN reaction causing severe rash involving mucus membranes or skin necrosis: No Has patient had a PCN reaction that required hospitalization No Has patient had a PCN reaction occurring within the last 10 years: No If all of the above answers are "NO", then may proceed with Cephalosporin use.        Medication List        Accurate as of April 08, 2023  2:05 PM. If you have any questions, ask your nurse or doctor.          albuterol 108 (90 Base) MCG/ACT inhaler Commonly known as: VENTOLIN HFA Inhale 2 puffs into the lungs every 6 (six) hours as needed for wheezing or shortness of breath.   ALPRAZolam 0.25 MG tablet Commonly known as: XANAX Take 0.25 mg by mouth See admin instructions. Take one tablet (0.25  mg) by mouth twice daily - morning and mid-afternoon   Anoro Ellipta 62.5-25 MCG/ACT Aepb Generic drug: umeclidinium-vilanterol 1 puff Inhalation Once a day   Anoro Ellipta 62.5-25 MCG/INH Aepb Generic drug: umeclidinium-vilanterol Inhale 1 puff into the lungs daily.   bumetanide 2 MG tablet Commonly known as: BUMEX Take 1 tablet every day by oral route.   clindamycin 300 MG capsule Commonly known as: CLEOCIN Take 600 mg by mouth See admin instructions. Take two capsules (600 mg) by mouth one hour prior to dental procedures   cyclobenzaprine 5 MG tablet Commonly known as: FLEXERIL TAKE 1 TABLET BY MOUTH TWICE A DAY AS NEEDED FOR MUSCLE SPASMS   Dexlansoprazole 30 MG capsule DR Take 30 mg by mouth every morning.   dicyclomine 10 MG capsule Commonly known as: BENTYL   DULoxetine 60 MG capsule Commonly known as: CYMBALTA Take 60 mg by mouth every morning.   ezetimibe 10 MG tablet Commonly known as: ZETIA   furosemide 20 MG tablet Commonly known as: LASIX Take 20 mg by mouth every morning.   gabapentin 300 MG capsule Commonly known as: NEURONTIN TAKE 1 CAPSULE BY MOUTH THREE TIMES A DAY   glipiZIDE 5 MG tablet Commonly known as: GLUCOTROL Take 5 mg by mouth daily before breakfast.   ipratropium 0.03 % nasal spray Commonly known as: ATROVENT   levocetirizine 5 MG tablet Commonly known as: XYZAL Take 5 mg by mouth at bedtime.   lisinopril 5 MG tablet Commonly known as: ZESTRIL Take 5 mg by mouth every morning.   meloxicam 7.5 MG tablet Commonly known as: MOBIC   methocarbamol 500 MG tablet Commonly known as: ROBAXIN Take 1,000 mg by mouth 3 (three) times daily.   metoprolol succinate 25 MG 24 hr tablet Commonly known as: TOPROL-XL Take 12.5 mg by mouth every morning.   montelukast 10 MG tablet Commonly known as: SINGULAIR Take 10 mg by mouth every morning.   Mounjaro 7.5 MG/0.5ML Pen Generic drug: tirzepatide Inject 7.5 mg into the skin once a  week.   naloxone 4 MG/0.1ML Liqd nasal spray kit Commonly known  as: NARCAN Place 0.4 mg into the nose daily as needed (opioid overdose).   rosuvastatin 10 MG tablet Commonly known as: CRESTOR Take 10 mg by mouth at bedtime.   traMADol 50 MG tablet Commonly known as: ULTRAM Take 1- 2  tablets three times a day as needed for pain.   venlafaxine XR 37.5 MG 24 hr capsule Commonly known as: EFFEXOR-XR Take 1 tablet by mouth daily.         ALLERGIES: Allergies  Allergen Reactions   Other Other (See Comments)   Salicylates    Compazine [Prochlorperazine] Other (See Comments)    Numbness of face and  lips    Hydrocodone Itching    High dose only   Naproxen Nausea Only and Other (See Comments)   Oxycodone Other (See Comments)    hallucinations    Penicillins Nausea Only and Other (See Comments)    Has patient had a PCN reaction causing immediate rash, facial/tongue/throat swelling, SOB or lightheadedness with hypotension: No Has patient had a PCN reaction causing severe rash involving mucus membranes or skin necrosis: No Has patient had a PCN reaction that required hospitalization No Has patient had a PCN reaction occurring within the last 10 years: No If all of the above answers are "NO", then may proceed with Cephalosporin use.     REVIEW OF SYSTEMS: A comprehensive ROS was conducted with the patient and is negative except as per HPI     OBJECTIVE:   VITAL SIGNS: Ht 5\' 2"  (1.575 m)   Wt 266 lb (120.7 kg)   BMI 48.65 kg/m    PHYSICAL EXAM:  General: Pt appears well and is in NAD  Neck: General: Supple without adenopathy or carotid bruits. Thyroid: Thyroid size normal.  No goiter or nodules appreciated.   Lungs: Clear with good BS bilat   Heart: RRR   Extremities:  Lower extremities - No pretibial edema.   Neuro: MS is good with appropriate affect, pt is alert and Ox3    DM foot exam: 04/08/2023  The skin of the feet is intact without sores or  ulcerations. The pedal pulses are 2+ on right and 2+ on left. The sensation is intact to a screening 5.07, 10 gram monofilament bilaterally   DATA REVIEWED:  No results found for: "HGBA1C" Lab Results  Component Value Date   CREATININE 1.02 (H) 09/29/2021   Old records , labs and images have been reviewed.    ASSESSMENT / PLAN / RECOMMENDATIONS:   1) Type 2 Diabetes Mellitus, Optimally controlled, With neuropathic  complications - Most recent A1c of 5.8 %. Goal A1c < 7.0 %.    Plan: GENERAL: I have discussed with the patient the pathophysiology of diabetes. We went over the natural progression of the disease. We stressed the importance of lifestyle changes. I explained the complications associated with diabetes including retinopathy, nephropathy, neuropathy as well as increased risk of cardiovascular disease. We went over the benefit seen with glycemic control.   I explained to the patient that diabetic patients are at higher than normal risk for amputations.  Intolerant to metformin She has tried Trulicity without intolerance issues I have recommended discontinuing glipizide and continuing Mounjaro  MEDICATIONS: Stop glipizide Continue Mounjaro 7.5 mg weekly    2) Diabetic complications:  Eye: Does not have known diabetic retinopathy.  Neuro/ Feet: Does  have known diabetic peripheral neuropathy. Renal: Patient does not have known baseline CKD. She is  on an ACEI/ARB at present.  3) Peripheral neuropathy:  -  This is chemo induced  -She is on gabapentin -Vitamin B12 and vitamin D****     Signed electronically by: Lyndle Herrlich, MD  [facility]  [facility] [address] [place], [place] [address] Phone: [phone] FAX: [phone]   CC: Sarita Bottom, NP [address], [address] [place] [address] Phone: [phone]  Fax: [phone]    Return to Endocrinology clinic as below: Future Appointments   Date Time Provider Department Center  04/25/2023 10:15 AM Vivi Barrack, DPM [facility]  07/08/2023  2:30 PM Waymon Budge, MD [facility] None  08/26/2023  2:20 PM Jones Bales, NP [facility]

## 2023-04-09 LAB — BASIC METABOLIC PANEL
BUN: 11 mg/dL (ref 6–23)
CO2: 31 meq/L (ref 19–32)
Calcium: 9.5 mg/dL (ref 8.4–10.5)
Chloride: 99 meq/L (ref 96–112)
Creatinine, Ser: 1.09 mg/dL (ref 0.40–1.20)
GFR: 53.81 mL/min — ABNORMAL LOW (ref >60.00)
Glucose, Bld: 84 mg/dL (ref 70–99)
Potassium: 3.9 meq/L (ref 3.5–5.1)
Sodium: 138 meq/L (ref 135–145)

## 2023-04-09 LAB — LIPID PANEL
Cholesterol: 132 mg/dL (ref 0–200)
HDL: 44.5 mg/dL (ref >39.00)
LDL Cholesterol: 67 mg/dL (ref 0–99)
NonHDL: 87.51
Total CHOL/HDL Ratio: 3
Triglycerides: 103 mg/dL (ref 0.0–149.0)
VLDL: 20.6 mg/dL (ref 0.0–40.0)

## 2023-04-09 LAB — MICROALBUMIN / CREATININE URINE RATIO
Creatinine,U: 81.2 mg/dL
Microalb Creat Ratio: 0.9 mg/g (ref 0.0–30.0)
Microalb, Ur: <0.7 mg/dL (ref 0.0–1.9)

## 2023-04-09 LAB — VITAMIN B12: Vitamin B-12: 301 pg/mL (ref 211–911)

## 2023-04-09 LAB — TSH: TSH: 1.68 u[IU]/mL (ref 0.35–5.50)

## 2023-04-09 LAB — VITAMIN D 25 HYDROXY (VIT D DEFICIENCY, FRACTURES): VITD: 9.78 ng/mL — ABNORMAL LOW (ref 30.00–100.00)

## 2023-04-10 ENCOUNTER — Telehealth: Payer: Self-pay | Admitting: Internal Medicine

## 2023-04-10 MED ORDER — VITAMIN D (ERGOCALCIFEROL) 1.25 MG (50000 UNIT) PO CAPS: 50000.0000 [IU] | ORAL_CAPSULE | ORAL | 2 refills | Status: DC

## 2023-04-10 NOTE — Telephone Encounter (Signed)
Please let the patient know that her vitamin D is very low, this could be the reason for her feet symptoms.   I have prescribed ergocalciferol once weekly, please make sure the patient understand this is only once a week  Vitamin B12 is slightly low, please asked the patient to start a B complex daily   Urine test and cholesterol are normal

## 2023-04-10 NOTE — Telephone Encounter (Signed)
Patient aware and will pick up medications.

## 2023-04-25 ENCOUNTER — Ambulatory Visit (INDEPENDENT_AMBULATORY_CARE_PROVIDER_SITE_OTHER): Payer: 59 | Admitting: Podiatry

## 2023-04-25 ENCOUNTER — Encounter: Payer: Self-pay | Admitting: Podiatry

## 2023-04-25 DIAGNOSIS — E1149 Type 2 diabetes mellitus with other diabetic neurological complication: Secondary | ICD-10-CM

## 2023-04-25 DIAGNOSIS — M79674 Pain in right toe(s): Secondary | ICD-10-CM

## 2023-04-25 DIAGNOSIS — M7752 Other enthesopathy of left foot: Secondary | ICD-10-CM

## 2023-04-25 DIAGNOSIS — M79675 Pain in left toe(s): Secondary | ICD-10-CM

## 2023-04-25 DIAGNOSIS — B351 Tinea unguium: Secondary | ICD-10-CM | POA: Diagnosis not present

## 2023-04-25 MED ORDER — MELOXICAM 7.5 MG PO TABS: 7.5000 mg | ORAL_TABLET | Freq: Every day | ORAL | 0 refills | Status: DC | PRN

## 2023-04-25 NOTE — Progress Notes (Signed)
Subjective:  Chief Complaint  Patient presents with   [facility]    RM#[id number] Patient here for nail trim and discuss treatment previously discussed.      62 year old female presents with the above concerns.  She is her nails trimmed as a thick elongated she cannot trim them herself.  She also states that her her foot pain she was not able to get to the pool do physical therapy on her own her symptoms of the same.  No new injuries that she reports.    Objective: AAO x3, NAD DP/PT pulses palpable bilaterally, CRT less than 3 seconds There is mild global tenderness around the ankle on both medial, lateral aspects, Achilles tendon bilaterally with minimal edema.  There is no area of pinpoint tenderness seen most of it seems to be soft tissue.  Symptoms are unchanged. Nails are hypertrophic, dystrophic, brittle, discolored, elongated 10. No surrounding redness or drainage. Tenderness nails 1-5 bilaterally. No open lesions or pre-ulcerative lesions are identified today.  Most of her tenderness is to the hallux toenails bilaterally. There is no other areas of discomfort noted today. No pain with calf compression, swelling, warmth, erythema  Assessment: Chronic ankle pain, symptomatic onychomycosis  Plan: -All treatment options discussed with the patient including all alternatives, risks, complications.  -Debrided nails x 10 without any complications or bleeding.  We discussed possibly total nail removal to bilateral hallux nails given the discomfort.  If she wants to proceed with this she will let us know. -Referral to physical therapy placed. -Compression socks for swelling.   Return in about 2 months (around 06/25/2023).  Vivi Barrack DPM

## 2023-04-30 ENCOUNTER — Telehealth: Payer: Self-pay | Admitting: Internal Medicine

## 2023-04-30 DIAGNOSIS — G4733 Obstructive sleep apnea (adult) (pediatric): Secondary | ICD-10-CM

## 2023-04-30 NOTE — Telephone Encounter (Signed)
Patient calling to check on CPAP order. Per Dr. Roxy Cedar notes on 04/05/2023, "Order- new DME, new CPAP auto 5-20, mask of choice, humidifier, supplies, AirView/ card ". Please advise.

## 2023-05-03 NOTE — Assessment & Plan Note (Signed)
CPAP is still bet option for her, with weight loss advised, Plan- new CPAP auto 5-20

## 2023-05-03 NOTE — Assessment & Plan Note (Signed)
Will watch with CXR

## 2023-05-08 ENCOUNTER — Ambulatory Visit (HOSPITAL_BASED_OUTPATIENT_CLINIC_OR_DEPARTMENT_OTHER): Payer: 59 | Admitting: Physical Therapy

## 2023-05-24 ENCOUNTER — Other Ambulatory Visit: Payer: Self-pay | Admitting: Nurse Practitioner

## 2023-05-24 DIAGNOSIS — Z1231 Encounter for screening mammogram for malignant neoplasm of breast: Secondary | ICD-10-CM

## 2023-06-11 ENCOUNTER — Other Ambulatory Visit: Payer: Self-pay | Admitting: Physical Medicine and Rehabilitation

## 2023-06-11 NOTE — Therapy (Signed)
 OUTPATIENT PHYSICAL THERAPY LOWER EXTREMITY EVALUATION   Patient Name: Kelsey Wilson MRN: [id number] DOB:June 17, 1960, 63 y.o., female Today's Date: 06/12/2023  END OF SESSION:  PT End of Session - 06/12/23 1330     Visit Number 1    Number of Visits 12    Date for PT Re-Evaluation 07/26/23    Authorization Type [employer]    Progress Note Due on Visit 10    PT Start Time 1202    PT Stop Time 1245    PT Time Calculation (min) 43 min    Activity Tolerance Patient tolerated treatment well;Patient limited by pain    Behavior During Therapy WFL for tasks assessed/performed             Past Medical History:  Diagnosis Date   Anemia    Anxiety    Arthritis    Breast cancer (HCC) 2010   T3N1 invasive ductal carcinoma left breast.Takes Arimidex  daily   Bursitis    Carpal tunnel syndrome    Chronic back pain    stenosis   Constipation    takes Colace daily   Depression    takes Benzotropine daily   Diverticulitis of colon    Dyspnea    daily when walking for over 1 yr.   Fibromyalgia 08/2012   GERD (gastroesophageal reflux disease)    takes Dexilant  daily   Hemorrhoid    History of blood transfusion    no abnormal reaction noted   History of colon polyps    benign   History of shingles    Joint pain    Joint swelling    Morbid obesity (HCC)    Night muscle spasms    takes Flexeril  nightly as needed   Nocturia    OSA (obstructive sleep apnea)    OSA on CPAP    Peripheral edema    takes Furosemide .Just started 01/18/16   Peripheral neuropathy    takes Lyrica  daily   Personal history of chemotherapy    2013   Personal history of radiation therapy    2013   Pneumonia    hx of-2015   Pseudoarthrosis of lumbar spine    Seasonal allergies    takes Singulair  nightly   SOB (shortness of breath) on exertion    rarely with exertion   Splenorenal shunt malfunction (HCC)    stable splenorenal shunt 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 Marlana)   Type II diabetes mellitus (HCC)    Past Surgical History:  Procedure Laterality Date   ABDOMINAL HYSTERECTOMY     still has ovaries   APPENDECTOMY     AXILLARY LYMPH NODE DISSECTION  11/28/2011   Procedure: AXILLARY LYMPH NODE DISSECTION;  Surgeon: Morene ONEIDA Olives, MD;  Location: [facility];  Service: General;  Laterality: Left;   BREAST LUMPECTOMY Left 11/28/2011   Malignant   BREAST SURGERY Left 2013   CARPAL TUNNEL RELEASE     Bilateral   CESAREAN SECTION     pt. has had 3   CHOLECYSTECTOMY     COLONOSCOPY     COLONOSCOPY WITH PROPOFOL  N/A 01/24/2021   Procedure: COLONOSCOPY WITH PROPOFOL ;  Surgeon: Legrand Victory LITTIE DOUGLAS, MD;  Location: [facility];  Service: Gastroenterology;  Laterality: N/A;   ESOPHAGOGASTRODUODENOSCOPY (EGD) WITH PROPOFOL  N/A 01/24/2021   Procedure: ESOPHAGOGASTRODUODENOSCOPY (EGD) WITH PROPOFOL ;  Surgeon: Legrand Victory LITTIE DOUGLAS, MD;  Location: [facility];  Service: Gastroenterology;  Laterality: N/A;   EYE SURGERY  Bilateral    cataract removal   KNEE SURGERY     Left Knee   LAMINECTOMY WITH POSTERIOR LATERAL ARTHRODESIS LEVEL 1 N/A 11/29/2017   Procedure: Posterior Lateral Fusion - Lumbar Four-Lumbar Five, removal and replacement of hardware, Laminectomy - Lumbar Four-Lumbar Five;  Surgeon: Joshua Alm RAMAN, MD;  Location[facility] OR;  Service: Neurosurgery;  Laterality: N/A;   LAMINECTOMY WITH POSTERIOR LATERAL ARTHRODESIS LEVEL 2 N/A 06/07/2017   Procedure: Posterior Lateral Fusion Lumbar Three-Four and Transforaminal Interbody Fusion Lumbar Four-Five with Segmental  Pedicle Screw Fixation;  Surgeon: Joshua Alm RAMAN, MD;  Location: [facility] OR;  Service: Neurosurgery;  Laterality: N/A;  Posterior Lateral Fusion Lumbar Three-Four and Transforaminal Interbody Fusion Lumbar Four-Five with Segmental  Pedicle Screw Fixation    LUMBAR FUSION  06/07/2017   POST   LUMBAR LAMINECTOMY/DECOMPRESSION MICRODISCECTOMY Bilateral 01/27/2016   Procedure:  Laminectomy and Foraminotomy - Lumbar four -Lumbar five - bilateral- on-lay noninstrumented fusion;  Surgeon: Alm RAMAN Joshua, MD;  Location: [facility];  Service: Neurosurgery;  Laterality: Bilateral;   MULTIPLE EXTRACTIONS WITH ALVEOLOPLASTY N/A 05/11/2016   Procedure: EXTRACTION OF TEETH EIGHTEEN, TWENTY AND TWENTY- NINE;  REMOVAL OF MANDIBULAR TORUS AND EXOSTOSIS;  Surgeon: Glendia Primrose, DDS;  Location: [facility] OR;  Service: Oral Surgery;  Laterality: N/A;   POLYPECTOMY  01/24/2021   Procedure: POLYPECTOMY;  Surgeon: Legrand Victory LITTIE DOUGLAS, MD;  Location: [facility];  Service: Gastroenterology;;   PORTACATH PLACEMENT  06/18/2011   Procedure: INSERTION PORT-A-CATH;  Surgeon: Morene ONEIDA Olives, MD;  Location: [facility];  Service: General;  Laterality: Right;  right subclavian   removal portacath  2014   spur     Apex spur on both big toes   TOE SURGERY Bilateral    TONSILLECTOMY     TOTAL KNEE ARTHROPLASTY Left 09/13/2014   TOTAL KNEE ARTHROPLASTY Left 09/13/2014   Procedure: LEFT TOTAL KNEE ARTHROPLASTY;  Surgeon: Redell Shoals, MD;  Location: [facility];  Service: Orthopedics;  Laterality: Left;   TOTAL KNEE ARTHROPLASTY Right 03/10/2015   Procedure: RIGHT TOTAL KNEE ARTHROPLASTY;  Surgeon: Redell Shoals, MD;  Location: [facility];  Service: Orthopedics;  Laterality: Right;   Patient Active Problem List   Diagnosis Date Noted   Drug-induced polyneuropathy (HCC) 04/08/2023   Morbid obesity (HCC)    Type II diabetes mellitus (HCC)    GERD (gastroesophageal reflux disease) 06/21/2019   Insomnia 03/03/2019   Ankle arthritis 12/12/2018   Muscle weakness 12/12/2018   Obstructive sleep apnea 10/23/2018   Asthmatic bronchitis 10/23/2018   Cellulitis of internal cheek, left 01/23/2018   Lung nodule 09/15/2017   Morbid obesity with BMI of 45.0-49.9, adult (HCC) 09/05/2017   Arthritis of carpometacarpal ([facility]) joints of both thumbs 03/14/2017   Spinal stenosis, lumbar region, with neurogenic  claudication 03/07/2017   Vertigo 10/12/2016   Peripheral positional vertigo 08/28/2016   Abnormal auditory perception of both ears 07/27/2016   Neuropathic pain 06/25/2016   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   S/P lumbar spinal fusion 01/27/2016   H/O therapeutic radiation 09/21/2015   Personal history of breast cancer 09/21/2015   Pes planus of both feet 08/04/2015   Primary osteoarthritis of right knee 03/10/2015   Osteoarthritis of right knee 02/04/2015   Primary osteoarthritis of left knee 09/13/2014   Hot flashes 01/19/2014   Abdominal pain 01/19/2014   Malignant neoplasm of upper-outer quadrant of left breast in female, estrogen receptor positive (HCC) 03/19/2013   Lumbosacral spondylosis without myelopathy 12/30/2012   Fibromyalgia syndrome  08/15/2012   Peripheral neuropathy, toxic 08/15/2012   Lymphedema of arm - left 04/30/2012    PCP: Glade Axe NP  REFERRING PROVIDER: Gershon Donnice SAUNDERS, DPM   REFERRING DIAG:  [phone] (ICD-10-CM) - Capsulitis of ankle, left  M77.52 (ICD-10-CM) - Tendonitis of ankle, left    THERAPY DIAG:  Pain in left ankle and joints of left foot  Pain in right ankle and joints of right foot  Difficulty in walking, not elsewhere classified  Rationale for Evaluation and Treatment: Rehabilitation  ONSET DATE: >5  SUBJECTIVE:   SUBJECTIVE STATEMENT: Had br cancer 12 years ago hand and feet hurt from the chemo. Have peripheral neuropathies due to DM and chemo.  Also dx with fibromyalgia.  Trying water PT to avoid ankle surgery.  Both ankle feet involvement. Can stand for maybe 15 minutes before needing to sit.  PERTINENT HISTORY: Chronic ankle pain; Lumbar lam and fusion 2023 Spinal stenosis TKR 2016 PAIN:  Are you having pain? Yes: NPRS scale: current 8/10; worst 10/10; least 5/10 Pain location: feet and ankle Pain description: numbness, achey heavy, tingling Aggravating factors: sitting in hard chairs Relieving factors:  sidelying  PRECAUTIONS: Fall  RED FLAGS: None   WEIGHT BEARING RESTRICTIONS: No  FALLS:  Has patient fallen in last 6 months? No  LIVING ENVIRONMENT: Lives with: lives with their family Lives in: House/apartment Stairs: 2-3 to enter home   OCCUPATION: disabled  PLOF: Independent  PATIENT GOALS: decrease ankle pain, put off ankle surgery  NEXT MD VISIT: around 06/25/23  OBJECTIVE:  Note: Objective measures were completed at Evaluation unless otherwise noted.  DIAGNOSTIC FINDINGS: N/a  PATIENT SURVEYS:  FOTO Primary measure 44% with goal of 51%  COGNITION: Overall cognitive status: Within functional limits for tasks assessed     SENSATION: WFL  EDEMA:  Mild edema about bilat ankles    PALPATION: mild global tenderness around the ankle on both medial, lateral aspects, Achilles tendon bilaterally with minimal edema.   LOWER EXTREMITY ROM:  Active ROM Right eval Left eval  Hip flexion    Hip extension    Hip abduction    Hip adduction    Hip internal rotation    Hip external rotation    Knee flexion    Knee extension    Ankle dorsiflexion 5 2  Ankle plantarflexion 16 8  Ankle inversion    Ankle eversion     (Blank rows = not tested)  LOWER EXTREMITY MMT:  MMT Right eval Left eval  Hip flexion 4 4  Hip extension    Hip abduction    Hip adduction    Hip internal rotation    Hip external rotation    Knee flexion 4 4  Knee extension 4 4  Ankle dorsiflexion 3+ P! 3P!  Ankle plantarflexion 3P! 3P!  Ankle inversion Not tolerated Not tolerated  Ankle eversion Not tolerated Not tolerated   (Blank rows = not tested)  LOWER EXTREMITY SPECIAL TESTS:  Ankle special tests: Anterior drawer test: positive  and Thompson's test: positive prone knee bent squeeze calf for sudden pf pos if pain  FUNCTIONAL TESTS:  Timed up and go (TUG): 17.84  GAIT: Distance walked: 400 ft 2 rest periods Assistive device utilized: None Level of assistance: Modified  independence Comments: Short quick steps, minimal heel strike (like walking on egg shells)  TREATMENT  Eval   PATIENT EDUCATION:  Education details: Discussed eval findings, rehab rationale, aquatic program progression/POC and pools in area. Patient is in agreement  Person educated: Patient Education method: Explanation Education comprehension: verbalized understanding  HOME EXERCISE PROGRAM: TBA  ASSESSMENT:  CLINICAL IMPRESSION: Patient is a 63 y.o. f who was seen today for physical therapy evaluation and treatment for Left ankle capsulitis and tendonitis.  Pt reports issue bilaterally with initial pain beginning after chemotherapy for br Ca x 12 years ago.  She has residual dysesthesias through LE mid thigh to feet as well as ue throughout wrists and hands.  Has other dx of OA, spinal stenosis and reports recent dx of PAD although I am unable to find notes in chart pertaining.  Pain in feet and ankle likely multifactorial but nonetheless limiting pt functional ability and mobility.  She will  benefit from skilled PT intervention in aquatics to improve deficits and manage pain.  Plan to add land based as approp towards end of certification  OBJECTIVE IMPAIRMENTS: Abnormal gait, decreased activity tolerance, decreased endurance, decreased mobility, difficulty walking, increased muscle spasms, impaired sensation, obesity, and pain.   ACTIVITY LIMITATIONS: standing, squatting, stairs, and locomotion level  PARTICIPATION LIMITATIONS: meal prep, cleaning, laundry, shopping, community activity, and yard work  PERSONAL FACTORS: Past/current experiences, Time since onset of injury/illness/exacerbation, and 3+ comorbidities: see PmHx  are also affecting patient's functional outcome.   REHAB POTENTIAL: Good  CLINICAL DECISION MAKING: Evolving/moderate  complexity  EVALUATION COMPLEXITY: Moderate   GOALS: Goals reviewed with patient? Yes  SHORT TERM GOALS: Target date: 07/26/23 Pt will tolerate full aquatic sessions consistently without increase in pain and with improving function to demonstrate good toleration and effectiveness of intervention.  Baseline: Goal status: INITIAL  2.  Pt will tolerate sitting in hard chair (kitchen) x 30 minutes without increased pain Baseline: 30 with increase in pain Goal status: INITIAL  3.  Pt will report tolerating standing > 20 minutes to cook or get through grocery store Baseline: 15 max Goal status: INITIAL  4.  Pt will improve ankle ROM by 50% Baseline: see chart Goal status: INITIAL  5.  Pt will be indep with final HEP's (land and aquatic as appropriate) for continued management of condition Baseline:  Goal status: INITIAL  6.  Pt will report decrease in worst ankle/foot pain 3 NPRS for improved toleration to activity Baseline: 10/10 Goal status: INITIAL  LONG TERM GOALS: To be set at re-cert as approp   PLAN:  PT FREQUENCY: 1-2x/week  PT DURATION: 6 weeksaquatic and land  PLANNED INTERVENTIONS: [id number]- PT Re-evaluation, 97110-Therapeutic exercises, 97530- Therapeutic activity, [id number]- Neuromuscular re-education, 97535- Self Care, [id number]- Manual therapy, [id number]- Gait training, [phone]- Orthotic Fit/training, [id number]- Aquatic Therapy, 97014- Electrical stimulation (unattended), [phone]- Ionotophoresis 4mg /ml Dexamethasone , Patient/Family education, Balance training, Stair training, Taping, Dry Needling, Joint mobilization, DME instructions, Cryotherapy, and Moist heat  PLAN FOR NEXT SESSION: aquatic: ankle ROM and strengthening. Balance retraining. Pain management  Ronal Kem) Jemal Miskell MPT 06/12/23 1:32 PM [facility] [address] [place], [place], [address] Phone: [phone]   Fax:  [phone]

## 2023-06-12 ENCOUNTER — Other Ambulatory Visit: Payer: Self-pay

## 2023-06-12 ENCOUNTER — Encounter (HOSPITAL_BASED_OUTPATIENT_CLINIC_OR_DEPARTMENT_OTHER): Payer: Self-pay | Admitting: Physical Therapy

## 2023-06-12 ENCOUNTER — Ambulatory Visit (HOSPITAL_BASED_OUTPATIENT_CLINIC_OR_DEPARTMENT_OTHER): Payer: Medicare Other | Attending: Podiatry | Admitting: Physical Therapy

## 2023-06-12 DIAGNOSIS — M25571 Pain in right ankle and joints of right foot: Secondary | ICD-10-CM | POA: Insufficient documentation

## 2023-06-12 DIAGNOSIS — R262 Difficulty in walking, not elsewhere classified: Secondary | ICD-10-CM | POA: Insufficient documentation

## 2023-06-12 DIAGNOSIS — M25572 Pain in left ankle and joints of left foot: Secondary | ICD-10-CM | POA: Insufficient documentation

## 2023-06-26 ENCOUNTER — Ambulatory Visit (HOSPITAL_BASED_OUTPATIENT_CLINIC_OR_DEPARTMENT_OTHER): Payer: Medicare Other | Admitting: Physical Therapy

## 2023-06-26 ENCOUNTER — Encounter (HOSPITAL_BASED_OUTPATIENT_CLINIC_OR_DEPARTMENT_OTHER): Payer: Self-pay | Admitting: Physical Therapy

## 2023-06-26 DIAGNOSIS — M6281 Muscle weakness (generalized): Secondary | ICD-10-CM

## 2023-06-26 DIAGNOSIS — M25572 Pain in left ankle and joints of left foot: Secondary | ICD-10-CM | POA: Diagnosis not present

## 2023-06-26 DIAGNOSIS — R262 Difficulty in walking, not elsewhere classified: Secondary | ICD-10-CM

## 2023-06-26 DIAGNOSIS — M25571 Pain in right ankle and joints of right foot: Secondary | ICD-10-CM

## 2023-06-26 NOTE — Therapy (Signed)
OUTPATIENT PHYSICAL THERAPY LOWER EXTREMITY EVALUATION   Patient Name: Kelsey Wilson MRN: [id number] DOB:03/10/1961, 63 y.o., female Today's Date: 06/26/2023  END OF SESSION:  PT End of Session - 06/26/23 1422     Visit Number 2    Number of Visits 12    Date for PT Re-Evaluation 07/26/23    Authorization Type [employer]    Progress Note Due on Visit 10    PT Start Time 1415    PT Stop Time 1455    PT Time Calculation (min) 40 min    Activity Tolerance Patient tolerated treatment well;Patient limited by pain    Behavior During Therapy WFL for tasks assessed/performed             Past Medical History:  Diagnosis Date   Anemia    Anxiety    Arthritis    Breast cancer (HCC) 2010   T3N1 invasive ductal carcinoma left breast.Takes Arimidex daily   Bursitis    Carpal tunnel syndrome    Chronic back pain    stenosis   Constipation    takes Colace daily   Depression    takes Benzotropine daily   Diverticulitis of colon    Dyspnea    daily when walking for over 1 yr.   Fibromyalgia 08/2012   GERD (gastroesophageal reflux disease)    takes Dexilant daily   Hemorrhoid    History of blood transfusion    no abnormal reaction noted   History of colon polyps    benign   History of shingles    Joint pain    Joint swelling    Morbid obesity (HCC)    Night muscle spasms    takes Flexeril nightly as needed   Nocturia    OSA (obstructive sleep apnea)    OSA on CPAP    Peripheral edema    takes Furosemide.Just started 01/18/16   Peripheral neuropathy    takes Lyrica daily   Personal history of chemotherapy    2013   Personal history of radiation therapy    2013   Pneumonia    hx of-2015   Pseudoarthrosis of lumbar spine    Seasonal allergies    takes Singulair nightly   SOB (shortness of breath) on exertion    rarely with exertion   Splenorenal shunt malfunction (HCC)    stable splenorenal shunt 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 Midge Aver)   Type II diabetes mellitus (HCC)    Past Surgical History:  Procedure Laterality Date   ABDOMINAL HYSTERECTOMY     still has ovaries   APPENDECTOMY     AXILLARY LYMPH NODE DISSECTION  11/28/2011   Procedure: AXILLARY LYMPH NODE DISSECTION;  Surgeon: Mariella Saa, MD;  Location: [facility];  Service: General;  Laterality: Left;   BREAST LUMPECTOMY Left 11/28/2011   Malignant   BREAST SURGERY Left 2013   CARPAL TUNNEL RELEASE     Bilateral   CESAREAN SECTION     pt. has had 3   CHOLECYSTECTOMY     COLONOSCOPY     COLONOSCOPY WITH PROPOFOL N/A 01/24/2021   Procedure: COLONOSCOPY WITH PROPOFOL;  Surgeon: Sherrilyn Rist, MD;  Location: [facility];  Service: Gastroenterology;  Laterality: N/A;   ESOPHAGOGASTRODUODENOSCOPY (EGD) WITH PROPOFOL N/A 01/24/2021   Procedure: ESOPHAGOGASTRODUODENOSCOPY (EGD) WITH PROPOFOL;  Surgeon: Sherrilyn Rist, MD;  Location: [facility];  Service: Gastroenterology;  Laterality: N/A;   EYE SURGERY  Bilateral    cataract removal   KNEE SURGERY     Left Knee   LAMINECTOMY WITH POSTERIOR LATERAL ARTHRODESIS LEVEL 1 N/A 11/29/2017   Procedure: Posterior Lateral Fusion - Lumbar Four-Lumbar Five, removal and replacement of hardware, Laminectomy - Lumbar Four-Lumbar Five;  Surgeon: Tia Alert, MD;  Location: [facility] OR;  Service: Neurosurgery;  Laterality: N/A;   LAMINECTOMY WITH POSTERIOR LATERAL ARTHRODESIS LEVEL 2 N/A 06/07/2017   Procedure: Posterior Lateral Fusion Lumbar Three-Four and Transforaminal Interbody Fusion Lumbar Four-Five with Segmental  Pedicle Screw Fixation;  Surgeon: Tia Alert, MD;  Location: [facility] OR;  Service: Neurosurgery;  Laterality: N/A;  Posterior Lateral Fusion Lumbar Three-Four and Transforaminal Interbody Fusion Lumbar Four-Five with Segmental  Pedicle Screw Fixation    LUMBAR FUSION  06/07/2017   POST   LUMBAR LAMINECTOMY/DECOMPRESSION MICRODISCECTOMY Bilateral 01/27/2016   Procedure:  Laminectomy and Foraminotomy - Lumbar four -Lumbar five - bilateral- on-lay noninstrumented fusion;  Surgeon: Tia Alert, MD;  Location: [facility];  Service: Neurosurgery;  Laterality: Bilateral;   MULTIPLE EXTRACTIONS WITH ALVEOLOPLASTY N/A 05/11/2016   Procedure: EXTRACTION OF TEETH EIGHTEEN, TWENTY AND TWENTY- NINE;  REMOVAL OF MANDIBULAR TORUS AND EXOSTOSIS;  Surgeon: Ocie Doyne, DDS;  Location: [facility];  Service: Oral Surgery;  Laterality: N/A;   POLYPECTOMY  01/24/2021   Procedure: POLYPECTOMY;  Surgeon: Sherrilyn Rist, MD;  Location: [facility];  Service: Gastroenterology;;   PORTACATH PLACEMENT  06/18/2011   Procedure: INSERTION PORT-A-CATH;  Surgeon: Mariella Saa, MD;  Location: [facility];  Service: General;  Laterality: Right;  right subclavian   removal portacath  2014   spur     Apex spur on both big toes   TOE SURGERY Bilateral    TONSILLECTOMY     TOTAL KNEE ARTHROPLASTY Left 09/13/2014   TOTAL KNEE ARTHROPLASTY Left 09/13/2014   Procedure: LEFT TOTAL KNEE ARTHROPLASTY;  Surgeon: Samson Frederic, MD;  Location: [facility];  Service: Orthopedics;  Laterality: Left;   TOTAL KNEE ARTHROPLASTY Right 03/10/2015   Procedure: RIGHT TOTAL KNEE ARTHROPLASTY;  Surgeon: Samson Frederic, MD;  Location: [facility];  Service: Orthopedics;  Laterality: Right;   Patient Active Problem List   Diagnosis Date Noted   Drug-induced polyneuropathy (HCC) 04/08/2023   Morbid obesity (HCC)    Type II diabetes mellitus (HCC)    GERD (gastroesophageal reflux disease) 06/21/2019   Insomnia 03/03/2019   Ankle arthritis 12/12/2018   Muscle weakness 12/12/2018   Obstructive sleep apnea 10/23/2018   Asthmatic bronchitis 10/23/2018   Cellulitis of internal cheek, left 01/23/2018   Lung nodule 09/15/2017   Morbid obesity with BMI of 45.0-49.9, adult (HCC) 09/05/2017   Arthritis of carpometacarpal ([facility]) joints of both thumbs 03/14/2017   Spinal stenosis, lumbar region, with neurogenic  claudication 03/07/2017   Vertigo 10/12/2016   Peripheral positional vertigo 08/28/2016   Abnormal auditory perception of both ears 07/27/2016   Neuropathic pain 06/25/2016   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   S/P lumbar spinal fusion 01/27/2016   H/O therapeutic radiation 09/21/2015   Personal history of breast cancer 09/21/2015   Pes planus of both feet 08/04/2015   Primary osteoarthritis of right knee 03/10/2015   Osteoarthritis of right knee 02/04/2015   Primary osteoarthritis of left knee 09/13/2014   Hot flashes 01/19/2014   Abdominal pain 01/19/2014   Malignant neoplasm of upper-outer quadrant of left breast in female, estrogen receptor positive (HCC) 03/19/2013   Lumbosacral spondylosis without myelopathy 12/30/2012   Fibromyalgia syndrome  08/15/2012   Peripheral neuropathy, toxic 08/15/2012   Lymphedema of arm - left 04/30/2012    PCP: Noreene Filbert NP  REFERRING PROVIDER: Vivi Barrack, DPM   REFERRING DIAG:  [phone] (ICD-10-CM) - Capsulitis of ankle, left  M77.52 (ICD-10-CM) - Tendonitis of ankle, left    THERAPY DIAG:  Pain in left ankle and joints of left foot  Pain in right ankle and joints of right foot  Difficulty in walking, not elsewhere classified  Muscle weakness (generalized)  Rationale for Evaluation and Treatment: Rehabilitation  ONSET DATE: >5  SUBJECTIVE:   SUBJECTIVE STATEMENT: No changes since eval   Initial subjective Had br cancer 12 years ago hand and feet hurt from the chemo. Have peripheral neuropathies due to DM and chemo.  Also dx with fibromyalgia.  Trying water PT to avoid ankle surgery.  Both ankle feet involvement. Can stand for maybe 15 minutes before needing to sit.  PERTINENT HISTORY: Chronic ankle pain; Lumbar lam and fusion 2023 Spinal stenosis TKR 2016 PAIN:  Are you having pain? Yes: NPRS scale: current 8/10; worst 10/10; least 5/10 Pain location: feet and ankle Pain description: numbness, achey heavy,  tingling Aggravating factors: sitting in hard chairs Relieving factors: sidelying  PRECAUTIONS: Fall  RED FLAGS: None   WEIGHT BEARING RESTRICTIONS: No  FALLS:  Has patient fallen in last 6 months? No  LIVING ENVIRONMENT: Lives with: lives with their family Lives in: House/apartment Stairs: 2-3 to enter home   OCCUPATION: disabled  PLOF: Independent  PATIENT GOALS: decrease ankle pain, put off ankle surgery  NEXT MD VISIT: "around 06/25/23"  OBJECTIVE:  Note: Objective measures were completed at Evaluation unless otherwise noted.  DIAGNOSTIC FINDINGS: N/a  PATIENT SURVEYS:  FOTO Primary measure 44% with goal of 51%  COGNITION: Overall cognitive status: Within functional limits for tasks assessed     SENSATION: WFL  EDEMA:  Mild edema about bilat ankles    PALPATION: mild global tenderness around the ankle on both medial, lateral aspects, Achilles tendon bilaterally with minimal edema.   LOWER EXTREMITY ROM:  Active ROM Right eval Left eval  Hip flexion    Hip extension    Hip abduction    Hip adduction    Hip internal rotation    Hip external rotation    Knee flexion    Knee extension    Ankle dorsiflexion 5 2  Ankle plantarflexion 16 8  Ankle inversion    Ankle eversion     (Blank rows = not tested)  LOWER EXTREMITY MMT:  MMT Right eval Left eval  Hip flexion 4 4  Hip extension    Hip abduction    Hip adduction    Hip internal rotation    Hip external rotation    Knee flexion 4 4  Knee extension 4 4  Ankle dorsiflexion 3+ P! 3P!  Ankle plantarflexion 3P! 3P!  Ankle inversion Not tolerated Not tolerated  Ankle eversion Not tolerated Not tolerated   (Blank rows = not tested)  LOWER EXTREMITY SPECIAL TESTS:  Ankle special tests: Anterior drawer test: positive  and Thompson's test: positive prone knee bent squeeze calf for sudden pf pos if pain  FUNCTIONAL TESTS:  Timed up and go (TUG): 17.84  GAIT: Distance walked: 400 ft 2  rest periods Assistive device utilized: None Level of assistance: Modified independence Comments: Short quick steps, minimal heel strike (like walking on egg shells)  TREATMENT  Pt seen for aquatic therapy today.  Treatment took place in water 3.5-4.75 ft in depth at the [facility] pool. Temp of water was 91.  Pt entered/exited the pool via stairs using step to pattern with hand rail.  *intro to setting *stair negotiation vc and sba *Standing ue support wall: high knee marching *seated on lift: cycling; LAQ; ankle df/pf; ankle circles; hip add/abd *walking forward x 4 widths ue support barbell x 4 begin 3.6 ->3.8.  Cues for heel strike and toe off *walking backward x 4 wdith 3.8 using barbell. Cues for toe strike and heel off *Walking side ways 3.8 x 2 widths using barbell  - seated rest period *walking forward 2 widths unsupported with arm swing *standing 4.0 ft: ue support wall: toe raises x 7; heel raises x 8; hip abd 2 x 5    Pt requires the buoyancy and hydrostatic pressure of water for support, and to offload joints by unweighting joint load by at least 50 % in navel deep water and by at least 75-80% in chest to neck deep water.  Viscosity of the water is needed for resistance of strengthening. Water current perturbations provides challenge to standing balance requiring increased core activation.     PATIENT EDUCATION:  Education details: Discussed eval findings, rehab rationale, aquatic program progression/POC and pools in area. Patient is in agreement  Person educated: Patient Education method: Explanation Education comprehension: verbalized understanding  HOME EXERCISE PROGRAM: TBA  ASSESSMENT:  CLINICAL IMPRESSION: Pt demonstrates safety in pool with therapist on land with close supervision.  She reports high feet and ankle pain  with initial arrival.  She has some apprehensiveness entering pool which decreased quickly once session begins.  She does report some hand discomfort with ue support on barbel.  Was able to advance her to amb without reducing issue. She was given multiple rest periods mostly standing.  Left foot and ankle pain >r today but overall slight reduction in pain as session wraps up.  She is a good candidate for aquatic therapy and will benefit from the properties of water to progress towards land based goals   Initial impression Patient is a 63 y.o. f who was seen today for physical therapy evaluation and treatment for Left ankle capsulitis and tendonitis.  Pt reports issue bilaterally with initial pain beginning after chemotherapy for br Ca x 12 years ago.  She has residual dysesthesias through LE mid thigh to feet as well as ue throughout wrists and hands.  Has other dx of OA, spinal stenosis and reports recent dx of PAD although I am unable to find notes in chart pertaining.  Pain in feet and ankle likely multifactorial but nonetheless limiting pt functional ability and mobility.  She will  benefit from skilled PT intervention in aquatics to improve deficits and manage pain.  Plan to add land based as approp towards end of certification  OBJECTIVE IMPAIRMENTS: Abnormal gait, decreased activity tolerance, decreased endurance, decreased mobility, difficulty walking, increased muscle spasms, impaired sensation, obesity, and pain.   ACTIVITY LIMITATIONS: standing, squatting, stairs, and locomotion level  PARTICIPATION LIMITATIONS: meal prep, cleaning, laundry, shopping, community activity, and yard work  PERSONAL FACTORS: Past/current experiences, Time since onset of injury/illness/exacerbation, and 3+ comorbidities: see PmHx  are also affecting patient's functional outcome.   REHAB POTENTIAL: Good  CLINICAL DECISION MAKING: Evolving/moderate complexity  EVALUATION COMPLEXITY: Moderate   GOALS: Goals  reviewed with patient? Yes  SHORT TERM GOALS: Target date: 07/26/23 Pt will tolerate full  aquatic sessions consistently without increase in pain and with improving function to demonstrate good toleration and effectiveness of intervention.  Baseline: Goal status: INITIAL  2.  Pt will tolerate sitting in hard chair (kitchen) x 30 minutes without increased pain Baseline: 30 with increase in pain Goal status: INITIAL  3.  Pt will report tolerating standing > 20 minutes to cook or get through grocery store Baseline: 15 max Goal status: INITIAL  4.  Pt will improve ankle ROM by 50% Baseline: see chart Goal status: INITIAL  5.  Pt will be indep with final HEP's (land and aquatic as appropriate) for continued management of condition Baseline:  Goal status: INITIAL  6.  Pt will report decrease in worst ankle/foot pain 3 NPRS for improved toleration to activity Baseline: 10/10 Goal status: INITIAL  LONG TERM GOALS: To be set at re-cert as approp   PLAN:  PT FREQUENCY: 1-2x/week  PT DURATION: 6 weeksaquatic and land  PLANNED INTERVENTIONS: 97164- PT Re-evaluation, 97110-Therapeutic exercises, 97530- Therapeutic activity, [id number]- Neuromuscular re-education, 97535- Self Care, 16109- Manual therapy, [id number]- Gait training, [id number]- Orthotic Fit/training, [id number]- Aquatic Therapy, 97014- Electrical stimulation (unattended), [phone]- Ionotophoresis 4mg /ml Dexamethasone, Patient/Family education, Balance training, Stair training, Taping, Dry Needling, Joint mobilization, DME instructions, Cryotherapy, and Moist heat  PLAN FOR NEXT SESSION: aquatic: ankle ROM and strengthening. Balance retraining. Pain management  Kelsey Wilson) Kelsey Wilson MPT 06/26/23 2:25 PM [facility] [address] [place], [place], [address] Phone: [phone]   Fax:  [phone]

## 2023-07-01 ENCOUNTER — Ambulatory Visit (INDEPENDENT_AMBULATORY_CARE_PROVIDER_SITE_OTHER): Payer: Medicare Other | Admitting: Podiatry

## 2023-07-01 ENCOUNTER — Encounter: Payer: Self-pay | Admitting: Podiatry

## 2023-07-01 DIAGNOSIS — E1149 Type 2 diabetes mellitus with other diabetic neurological complication: Secondary | ICD-10-CM | POA: Diagnosis not present

## 2023-07-01 DIAGNOSIS — B351 Tinea unguium: Secondary | ICD-10-CM

## 2023-07-01 DIAGNOSIS — M79675 Pain in left toe(s): Secondary | ICD-10-CM | POA: Diagnosis not present

## 2023-07-01 DIAGNOSIS — M19079 Primary osteoarthritis, unspecified ankle and foot: Secondary | ICD-10-CM

## 2023-07-01 DIAGNOSIS — M79674 Pain in right toe(s): Secondary | ICD-10-CM

## 2023-07-01 DIAGNOSIS — M7752 Other enthesopathy of left foot: Secondary | ICD-10-CM

## 2023-07-01 MED ORDER — LIDOCAINE 5 % EX PTCH: 1.0000 | MEDICATED_PATCH | CUTANEOUS | 0 refills | Status: DC

## 2023-07-01 NOTE — Progress Notes (Unsigned)
Subjective: Chief Complaint  Patient presents with   [facility]    RM#11 dfc / bilateral big toe nail pain    63 year old female presents with the above concerns.  She is her nails trimmed as a thick elongated she cannot trim them herself.  She is concerned about the pain in her toes particularly toenails that she gets pain to her toe overall as well.  States that she is helping with her ankles.  She has recently started physical therapy.   Objective: AAO x3, NAD DP/PT pulses palpable bilaterally, CRT less than 3 seconds There is mild global tenderness around the ankle on both medial, lateral aspects, Achilles tendon bilaterally with minimal edema.  There is no area of pinpoint tenderness seen most of it seems to be soft tissue.  There is increased range of motion first MPJ with occasional discomfort in this area. Nails are hypertrophic, dystrophic, brittle, discolored, elongated 10. No surrounding redness or drainage. Tenderness nails 1-5 bilaterally.  Patient with tenderness on the hallux toenails.  No open lesions or pre-ulcerative lesions are identified today.  Most of her tenderness is to the hallux toenails bilaterally. There is no other areas of discomfort noted today. No pain with calf compression, swelling, warmth, erythema  Assessment: Chronic ankle pain, symptomatic onychomycosis  Plan: Symptomatic onychomycosis -Sharply debrided nails x 10 without any complications or bleeding -We discussed different treatment options for nail fungus including oral, topical as well as alternative treatments.  She was proceed with oral Lamisil.  [facility] and we will try to get the results of blood work. -Discussed side effects, success rates Lamisil.  Chronic ankle/foot pain -Please continue physical therapy.  Continue supportive shoe gear. -Lidocaine patches ordered.  -Continue gabapentin   Return in about 3 months (around 09/29/2023).  Vivi Barrack DPM

## 2023-07-01 NOTE — Patient Instructions (Signed)
Terbinafine Tablets What is this medication? TERBINAFINE (TER bin a feen) treats fungal infections of the nails. It belongs to a group of medications called antifungals. It will not treat infections caused by bacteria or viruses. This medicine may be used for other purposes; ask your health care provider or pharmacist if you have questions. COMMON BRAND NAME(S): Lamisil, Terbinex What should I tell my care team before I take this medication? They need to know if you have any of these conditions: Liver disease An unusual or allergic reaction to terbinafine, other medications, foods, dyes, or preservatives Pregnant or trying to get pregnant Breast-feeding How should I use this medication? Take this medication by mouth with water. Take it as directed on the prescription label at the same time every day. You can take it with or without food. If it upsets your stomach, take it with food. Keep taking it unless your care team tells you to stop. A special MedGuide will be given to you by the pharmacist with each prescription and refill. Be sure to read this information carefully each time. Talk to your care team about the use of this medication in children. Special care may be needed. Overdosage: If you think you have taken too much of this medicine contact a poison control center or emergency room at once. NOTE: This medicine is only for you. Do not share this medicine with others. What if I miss a dose? If you miss a dose, take it as soon as you can unless it is more than 4 hours late. If it is more than 4 hours late, skip the missed dose. Take the next dose at the normal time. What may interact with this medication? Do not take this medication with any of the following: Pimozide Thioridazine This medication may also interact with the following: Beta blockers Caffeine Certain medications for mental health conditions Cimetidine Cyclosporine Medications for fungal infections, such as fluconazole  or ketoconazole Medications for irregular heartbeat, such as amiodarone, flecainide, propafenone Rifampin Warfarin This list may not describe all possible interactions. Give your health care provider a list of all the medicines, herbs, non-prescription drugs, or dietary supplements you use. Also tell them if you smoke, drink alcohol, or use illegal drugs. Some items may interact with your medicine. What should I watch for while using this medication? Visit your care team for regular checks on your progress. Tell your care team if your symptoms do not start to get better or if they get worse. This medication may cause serious skin reactions. They can happen weeks to months after starting the medication. Contact your care team right away if you notice fevers or flu-like symptoms with a rash. The rash may be red or purple and then turn into blisters or peeling of the skin. You may also notice a red rash with swelling of the face, lips, or lymph nodes in your neck or under your arms. This medication can make you more sensitive to the sun. Keep out of the sun. If you cannot avoid being in the sun, wear protective clothing and sunscreen. Do not use sun lamps, tanning beds, or tanning booths. What side effects may I notice from receiving this medication? Side effects that you should report to your care team as soon as possible: Allergic reactions--skin rash, itching, hives, swelling of the face, lips, tongue, or throat Change in sense of smell Change in taste Infection--fever, chills, cough, or sore throat Liver injury--right upper belly pain, loss of appetite, nausea, light-colored stool, dark  yellow or brown urine, yellowing skin or eyes, unusual weakness or fatigue Low red blood cell level--unusual weakness or fatigue, dizziness, headache, trouble breathing Lupus-like syndrome--joint pain, swelling, or stiffness, butterfly-shaped rash on the face, rashes that get worse in the sun, fever, unusual  weakness or fatigue Rash, fever, and swollen lymph nodes Redness, blistering, peeling, or loosening of the skin, including inside the mouth Unusual bruising or bleeding Worsening mood, feelings of depression Side effects that usually do not require medical attention (report to your care team if they continue or are bothersome): Diarrhea Gas Headache Nausea Stomach pain Upset stomach This list may not describe all possible side effects. Call your doctor for medical advice about side effects. You may report side effects to FDA at 1-800-FDA-1088. Where should I keep my medication? Keep out of the reach of children and pets. Store between 20 and 25 degrees C (68 and 77 degrees F). Protect from light. Get rid of any unused medication after the expiration date. To get rid of medications that are no longer needed or have expired: Take the medication to a medication take-back program. Check with your pharmacy or law enforcement to find a location. If you cannot return the medication, check the label or package insert to see if the medication should be thrown out in the garbage or flushed down the toilet. If you are not sure, ask your care team. If it is safe to put it in the trash, take the medication out of the container. Mix the medication with cat litter, dirt, coffee grounds, or other unwanted substance. Seal the mixture in a bag or container. Put it in the trash. NOTE: This sheet is a summary. It may not cover all possible information. If you have questions about this medicine, talk to your doctor, pharmacist, or health care provider.  2024 Elsevier/Gold Standard (2022-12-07 00:00:00)

## 2023-07-02 ENCOUNTER — Ambulatory Visit (HOSPITAL_BASED_OUTPATIENT_CLINIC_OR_DEPARTMENT_OTHER): Payer: 59 | Admitting: Physical Therapy

## 2023-07-03 ENCOUNTER — Ambulatory Visit (HOSPITAL_BASED_OUTPATIENT_CLINIC_OR_DEPARTMENT_OTHER): Payer: Medicare Other | Admitting: Physical Therapy

## 2023-07-03 ENCOUNTER — Encounter (HOSPITAL_BASED_OUTPATIENT_CLINIC_OR_DEPARTMENT_OTHER): Payer: Self-pay | Admitting: Physical Therapy

## 2023-07-03 DIAGNOSIS — M25572 Pain in left ankle and joints of left foot: Secondary | ICD-10-CM | POA: Diagnosis not present

## 2023-07-03 DIAGNOSIS — R262 Difficulty in walking, not elsewhere classified: Secondary | ICD-10-CM

## 2023-07-03 DIAGNOSIS — M25571 Pain in right ankle and joints of right foot: Secondary | ICD-10-CM

## 2023-07-03 NOTE — Therapy (Signed)
OUTPATIENT PHYSICAL THERAPY LOWER EXTREMITY EVALUATION   Patient Name: Kelsey Wilson MRN: [id number] DOB:[birth year], 63 y.o., female Today's Date: 07/03/2023  END OF SESSION:  PT End of Session - 07/03/23 1644     Visit Number 3    Number of Visits 12    Date for PT Re-Evaluation 07/26/23    Authorization Type [employer]    Progress Note Due on Visit 10    PT Start Time 1634    PT Stop Time 1715    PT Time Calculation (min) 41 min    Activity Tolerance Patient tolerated treatment well;Patient limited by pain    Behavior During Therapy WFL for tasks assessed/performed              Past Medical History:  Diagnosis Date   Anemia    Anxiety    Arthritis    Breast cancer (HCC) 2010   T3N1 invasive ductal carcinoma left breast.Takes Arimidex daily   Bursitis    Carpal tunnel syndrome    Chronic back pain    stenosis   Constipation    takes Colace daily   Depression    takes Benzotropine daily   Diverticulitis of colon    Dyspnea    daily when walking for over 1 yr.   Fibromyalgia 08/2012   GERD (gastroesophageal reflux disease)    takes Dexilant daily   Hemorrhoid    History of blood transfusion    no abnormal reaction noted   History of colon polyps    benign   History of shingles    Joint pain    Joint swelling    Morbid obesity (HCC)    Night muscle spasms    takes Flexeril nightly as needed   Nocturia    OSA (obstructive sleep apnea)    OSA on CPAP    Peripheral edema    takes Furosemide.Just started 01/18/16   Peripheral neuropathy    takes Lyrica daily   Personal history of chemotherapy    2013   Personal history of radiation therapy    2013   Pneumonia    hx of-2015   Pseudoarthrosis of lumbar spine    Seasonal allergies    takes Singulair nightly   SOB (shortness of breath) on exertion    rarely with exertion   Splenorenal shunt malfunction (HCC)    stable splenorenal shunt 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 Midge Aver)   Type II diabetes mellitus (HCC)    Past Surgical History:  Procedure Laterality Date   ABDOMINAL HYSTERECTOMY     still has ovaries   APPENDECTOMY     AXILLARY LYMPH NODE DISSECTION  11/28/2011   Procedure: AXILLARY LYMPH NODE DISSECTION;  Surgeon: Mariella Saa, MD;  Location: [facility];  Service: General;  Laterality: Left;   BREAST LUMPECTOMY Left 11/28/2011   Malignant   BREAST SURGERY Left 2013   CARPAL TUNNEL RELEASE     Bilateral   CESAREAN SECTION     pt. has had 3   CHOLECYSTECTOMY     COLONOSCOPY     COLONOSCOPY WITH PROPOFOL N/A 01/24/2021   Procedure: COLONOSCOPY WITH PROPOFOL;  Surgeon: Sherrilyn Rist, MD;  Location: [facility];  Service: Gastroenterology;  Laterality: N/A;   ESOPHAGOGASTRODUODENOSCOPY (EGD) WITH PROPOFOL N/A 01/24/2021   Procedure: ESOPHAGOGASTRODUODENOSCOPY (EGD) WITH PROPOFOL;  Surgeon: Sherrilyn Rist, MD;  Location: [facility];  Service: Gastroenterology;  Laterality: N/A;   EYE  SURGERY Bilateral    cataract removal   KNEE SURGERY     Left Knee   LAMINECTOMY WITH POSTERIOR LATERAL ARTHRODESIS LEVEL 1 N/A 11/29/2017   Procedure: Posterior Lateral Fusion - Lumbar Four-Lumbar Five, removal and replacement of hardware, Laminectomy - Lumbar Four-Lumbar Five;  Surgeon: Tia Alert, MD;  Location: [facility];  Service: Neurosurgery;  Laterality: N/A;   LAMINECTOMY WITH POSTERIOR LATERAL ARTHRODESIS LEVEL 2 N/A 06/07/2017   Procedure: Posterior Lateral Fusion Lumbar Three-Four and Transforaminal Interbody Fusion Lumbar Four-Five with Segmental  Pedicle Screw Fixation;  Surgeon: Tia Alert, MD;  Location: [facility] OR;  Service: Neurosurgery;  Laterality: N/A;  Posterior Lateral Fusion Lumbar Three-Four and Transforaminal Interbody Fusion Lumbar Four-Five with Segmental  Pedicle Screw Fixation    LUMBAR FUSION  06/07/2017   POST   LUMBAR LAMINECTOMY/DECOMPRESSION MICRODISCECTOMY Bilateral 01/27/2016   Procedure:  Laminectomy and Foraminotomy - Lumbar four -Lumbar five - bilateral- on-lay noninstrumented fusion;  Surgeon: Tia Alert, MD;  Location: [facility];  Service: Neurosurgery;  Laterality: Bilateral;   MULTIPLE EXTRACTIONS WITH ALVEOLOPLASTY N/A 05/11/2016   Procedure: EXTRACTION OF TEETH EIGHTEEN, TWENTY AND TWENTY- NINE;  REMOVAL OF MANDIBULAR TORUS AND EXOSTOSIS;  Surgeon: Ocie Doyne, DDS;  Location: [facility];  Service: Oral Surgery;  Laterality: N/A;   POLYPECTOMY  01/24/2021   Procedure: POLYPECTOMY;  Surgeon: Sherrilyn Rist, MD;  Location: [facility];  Service: Gastroenterology;;   PORTACATH PLACEMENT  06/18/2011   Procedure: INSERTION PORT-A-CATH;  Surgeon: Mariella Saa, MD;  Location: [facility];  Service: General;  Laterality: Right;  right subclavian   removal portacath  2014   spur     Apex spur on both big toes   TOE SURGERY Bilateral    TONSILLECTOMY     TOTAL KNEE ARTHROPLASTY Left 09/13/2014   TOTAL KNEE ARTHROPLASTY Left 09/13/2014   Procedure: LEFT TOTAL KNEE ARTHROPLASTY;  Surgeon: Samson Frederic, MD;  Location: [facility];  Service: Orthopedics;  Laterality: Left;   TOTAL KNEE ARTHROPLASTY Right 03/10/2015   Procedure: RIGHT TOTAL KNEE ARTHROPLASTY;  Surgeon: Samson Frederic, MD;  Location: [facility];  Service: Orthopedics;  Laterality: Right;   Patient Active Problem List   Diagnosis Date Noted   Drug-induced polyneuropathy (HCC) 04/08/2023   Morbid obesity (HCC)    Type II diabetes mellitus (HCC)    GERD (gastroesophageal reflux disease) 06/21/2019   Insomnia 03/03/2019   Ankle arthritis 12/12/2018   Muscle weakness 12/12/2018   Obstructive sleep apnea 10/23/2018   Asthmatic bronchitis 10/23/2018   Cellulitis of internal cheek, left 01/23/2018   Lung nodule 09/15/2017   Morbid obesity with BMI of 45.0-49.9, adult (HCC) 09/05/2017   Arthritis of carpometacarpal (CMC) joints of both thumbs 03/14/2017   Spinal stenosis, lumbar region, with neurogenic  claudication 03/07/2017   Vertigo 10/12/2016   Peripheral positional vertigo 08/28/2016   Abnormal auditory perception of both ears 07/27/2016   Neuropathic pain 06/25/2016   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   S/P lumbar spinal fusion 01/27/2016   H/O therapeutic radiation 09/21/2015   Personal history of breast cancer 09/21/2015   Pes planus of both feet 08/04/2015   Primary osteoarthritis of right knee 03/10/2015   Osteoarthritis of right knee 02/04/2015   Primary osteoarthritis of left knee 09/13/2014   Hot flashes 01/19/2014   Abdominal pain 01/19/2014   Malignant neoplasm of upper-outer quadrant of left breast in female, estrogen receptor positive (HCC) 03/19/2013   Lumbosacral spondylosis without myelopathy 12/30/2012   Fibromyalgia  syndrome 08/15/2012   Peripheral neuropathy, toxic 08/15/2012   Lymphedema of arm - left 04/30/2012    PCP: Noreene Filbert NP  REFERRING PROVIDER: Vivi Barrack, DPM   REFERRING DIAG:  [id number] (ICD-10-CM) - Capsulitis of ankle, left  M77.52 (ICD-10-CM) - Tendonitis of ankle, left    THERAPY DIAG:  Pain in left ankle and joints of left foot  Pain in right ankle and joints of right foot  Difficulty in walking, not elsewhere classified  Rationale for Evaluation and Treatment: Rehabilitation  ONSET DATE: >5  SUBJECTIVE:   SUBJECTIVE STATEMENT: No changes since eval   Initial subjective Had br cancer 12 years ago hand and feet hurt from the chemo. Have peripheral neuropathies due to DM and chemo.  Also dx with fibromyalgia.  Trying water PT to avoid ankle surgery.  Both ankle feet involvement. Can stand for maybe 15 minutes before needing to sit.  PERTINENT HISTORY: Chronic ankle pain; Lumbar lam and fusion 2023 Spinal stenosis TKR 2016 PAIN:  Are you having pain? Yes: NPRS scale: current 9/10; worst 10/10; least 5/10 Pain location: feet and ankle Pain description: numbness, achey heavy, tingling Aggravating factors:  sitting in hard chairs Relieving factors: sidelying  PRECAUTIONS: Fall  RED FLAGS: None   WEIGHT BEARING RESTRICTIONS: No  FALLS:  Has patient fallen in last 6 months? No  LIVING ENVIRONMENT: Lives with: lives with their family Lives in: House/apartment Stairs: 2-3 to enter home   OCCUPATION: disabled  PLOF: Independent  PATIENT GOALS: decrease ankle pain, put off ankle surgery  NEXT MD VISIT: "around 06/25/23"  OBJECTIVE:  Note: Objective measures were completed at Evaluation unless otherwise noted.  DIAGNOSTIC FINDINGS: N/a  PATIENT SURVEYS:  FOTO Primary measure 44% with goal of 51%  COGNITION: Overall cognitive status: Within functional limits for tasks assessed     SENSATION: WFL  EDEMA:  Mild edema about bilat ankles    PALPATION: mild global tenderness around the ankle on both medial, lateral aspects, Achilles tendon bilaterally with minimal edema.   LOWER EXTREMITY ROM:  Active ROM Right eval Left eval  Hip flexion    Hip extension    Hip abduction    Hip adduction    Hip internal rotation    Hip external rotation    Knee flexion    Knee extension    Ankle dorsiflexion 5 2  Ankle plantarflexion 16 8  Ankle inversion    Ankle eversion     (Blank rows = not tested)  LOWER EXTREMITY MMT:  MMT Right eval Left eval  Hip flexion 4 4  Hip extension    Hip abduction    Hip adduction    Hip internal rotation    Hip external rotation    Knee flexion 4 4  Knee extension 4 4  Ankle dorsiflexion 3+ P! 3P!  Ankle plantarflexion 3P! 3P!  Ankle inversion Not tolerated Not tolerated  Ankle eversion Not tolerated Not tolerated   (Blank rows = not tested)  LOWER EXTREMITY SPECIAL TESTS:  Ankle special tests: Anterior drawer test: positive  and Thompson's test: positive prone knee bent squeeze calf for sudden pf pos if pain  FUNCTIONAL TESTS:  Timed up and go (TUG): 17.84  GAIT: Distance walked: 400 ft 2 rest periods Assistive device  utilized: None Level of assistance: Modified independence Comments: Short quick steps, minimal heel strike (like walking on egg shells)  TREATMENT  Pt seen for aquatic therapy today.  Treatment took place in water 3.5-4.75 ft in depth at the [facility] pool. Temp of water was 91.  Pt entered/exited the pool via stairs using step to pattern with hand rail.   *stair negotiation vc and sba *seated on lift: cycling; LAQ; ankle df/pf; ankle circles; hip add/abd *walking forward x 4 widths ue support barbell x 4 begin 3.6 ->3.8.  Cues for heel strike and toe off  - squatted rest period Side stepping right and left along wall x 10 feet each directions x 2  - squatted rest period Standing ue support wall 4.0 ft: toe raises, heel raises, marching x 5   - seated rest period *walking backward x 2 wdith 3.8 using barbell. Cues for toe strike and heel off *stretching in seated position hamstring; gastroc and hip add  Pt requires the buoyancy and hydrostatic pressure of water for support, and to offload joints by unweighting joint load by at least 50 % in navel deep water and by at least 75-80% in chest to neck deep water.  Viscosity of the water is needed for resistance of strengthening. Water current perturbations provides challenge to standing balance requiring increased core activation.     PATIENT EDUCATION:  Education details: Discussed eval findings, rehab rationale, aquatic program progression/POC and pools in area. Patient is in agreement  Person educated: Patient Education method: Explanation Education comprehension: verbalized understanding  HOME EXERCISE PROGRAM: TBA  ASSESSMENT:  CLINICAL IMPRESSION: Pt presents with complaints of high pain in ankles L>R.  Saw MD who offered injections a few days ago in which she has declined.  He has ordered  her pain patches as she states the pain pills do not work well. Worked on gentle ROM and movement of ankles in sitting and standing positions.  Encouraged deeper submersion to decrease load on ankle which she is slightly apprehensive with but acclimated well.  She does require increased rest periods sitting and squatting today due to the increase in pain sensitivity.  Goals ongoing  Initial impression Patient is a 63 y.o. f who was seen today for physical therapy evaluation and treatment for Left ankle capsulitis and tendonitis.  Pt reports issue bilaterally with initial pain beginning after chemotherapy for br Ca x 12 years ago.  She has residual dysesthesias through LE mid thigh to feet as well as ue throughout wrists and hands.  Has other dx of OA, spinal stenosis and reports recent dx of PAD although I am unable to find notes in chart pertaining.  Pain in feet and ankle likely multifactorial but nonetheless limiting pt functional ability and mobility.  She will  benefit from skilled PT intervention in aquatics to improve deficits and manage pain.  Plan to add land based as approp towards end of certification  OBJECTIVE IMPAIRMENTS: Abnormal gait, decreased activity tolerance, decreased endurance, decreased mobility, difficulty walking, increased muscle spasms, impaired sensation, obesity, and pain.   ACTIVITY LIMITATIONS: standing, squatting, stairs, and locomotion level  PARTICIPATION LIMITATIONS: meal prep, cleaning, laundry, shopping, community activity, and yard work  PERSONAL FACTORS: Past/current experiences, Time since onset of injury/illness/exacerbation, and 3+ comorbidities: see PmHx  are also affecting patient's functional outcome.   REHAB POTENTIAL: Good  CLINICAL DECISION MAKING: Evolving/moderate complexity  EVALUATION COMPLEXITY: Moderate   GOALS: Goals reviewed with patient? Yes  SHORT TERM GOALS: Target date: 07/26/23 Pt will tolerate full aquatic sessions consistently  without increase in pain and with improving function to demonstrate good toleration  and effectiveness of intervention.  Baseline: Goal status: INITIAL  2.  Pt will tolerate sitting in hard chair (kitchen) x 30 minutes without increased pain Baseline: 30 with increase in pain Goal status: INITIAL  3.  Pt will report tolerating standing > 20 minutes to cook or get through grocery store Baseline: 15 max Goal status: INITIAL  4.  Pt will improve ankle ROM by 50% Baseline: see chart Goal status: INITIAL  5.  Pt will be indep with final HEP's (land and aquatic as appropriate) for continued management of condition Baseline:  Goal status: INITIAL  6.  Pt will report decrease in worst ankle/foot pain 3 NPRS for improved toleration to activity Baseline: 10/10 Goal status: INITIAL  LONG TERM GOALS: To be set at re-cert as approp   PLAN:  PT FREQUENCY: 1-2x/week  PT DURATION: 6 weeksaquatic and land  PLANNED INTERVENTIONS: 97164- PT Re-evaluation, 97110-Therapeutic exercises, 97530- Therapeutic activity, [id number]- Neuromuscular re-education, 97535- Self Care, 62130- Manual therapy, [id number]- Gait training, [phone]- Orthotic Fit/training, [id number]- Aquatic Therapy, 97014- Electrical stimulation (unattended), [phone]- Ionotophoresis 4mg /ml Dexamethasone, Patient/Family education, Balance training, Stair training, Taping, Dry Needling, Joint mobilization, DME instructions, Cryotherapy, and Moist heat  PLAN FOR NEXT SESSION: aquatic: ankle ROM and strengthening. Balance retraining. Pain management  Rushie Chestnut) Cesar Rogerson MPT 07/03/23 4:46 PM [facility] [address] [place], [place], [address] Phone: [phone]   Fax:  [phone]

## 2023-07-05 NOTE — Progress Notes (Unsigned)
HPI F never smoker followed for OSA,, Asthma, allergic rhinitis,  lung nodules, complicated by DM2, hx Breast cancer L in remission, HBP HST 01/30/2019- AHI 23.7/ hr, desaturation to 71%, body weight 293 lbs NPSG 07/23/2016- AHI 116.6/ hr, desaturation to 72% HST 01/09/23- AHI 15.3/hr, desat to 82%, body weight 268 lbs  ------------------------------------------------------------------------------------   04/05/23- 62 yoF never smoker followed for  Asthma, lung nodules, Hx OSA (failed CPAP) complicated by DM2, hx Breast cancer L in remission, HBP, GERD, Tremor/ movement disorder/ Neurology, Prurigo Nodularis,  -Anoro, Singulair, albuterol hfa HST 01/09/23- AHI 15.3/hr, desat to 82%, body weight 268 lbs Body weight today-   264 lbs For treatment decision Didn't like CPAP before- loud and would fall asleep before putting it on. Now pending dental partial plate, so OAP not an option. Had flu vax. CXR 01/09/23- IMPRESSION: No acute cardiopulmonary disease.  07/08/23- 62 yoF never smoker followed for  Asthma, lung nodules, OSA, complicated by DM2, hx Breast cancer L in remission, HBP, GERD, Tremor/ movement disorder/ Neurology, Prurigo Nodularis,  -Anoro, Singulair, albuterol hfa Body weight today-   265 lbs CPAP auto 5-20/ [employer]   new order 05/01/23 Download compliance- 50%, AHI 8.7/hr Compliance goals emphasized. Discussed the use of AI scribe software for clinical note transcription with the patient, who gave verbal consent to proceed.  History of Present Illness   The patient, with a history of obstructive sleep apnea (OSA) managed with CPAP, presents with daytime sleepiness. She reports feeling tired every day around the same time due to early morning wake times. She avoids napping and consumes caffeine, lightly,  in the form of coffee and soda. She has been using a CPAP machine, which she initially disliked but has grown accustomed to. She uses a 'mustache mask' style after finding the nasal mask  irritating. However, she reports some nights where she does not use the CPAP for at least four hours due to discomfort.  In addition to OSA, the patient has a history of nodules in the lungs, which were stable at last check in August. She also reports breathlessness with exertion, such as walking too far or climbing stairs. This is unchanged and consistent with her level of fitness. She is currently taking Alprazolam to aid with sleep.     ROS-see HPI   + = positive Constitutional:    weight loss, night sweats, fevers, chills, fatigue, lassitude. HEENT:    headaches, +difficulty swallowing, +tooth/dental problems, sore throat,       +sneezing,+ itching,+ ear ache, nasal congestion, post nasal drip, snoring CV:    chest pain, orthopnea, PND, +swelling in lower extremities, anasarca,                                   dizziness, palpitations Resp:   +shortness of breath with exertion or at rest.                productive cough,   non-productive cough, coughing up of blood.              change in color of mucus.  wheezing.   Skin:    rash or lesions. GI:  No-   heartburn, indigestion, +abdominal pain, nausea, vomiting, diarrhea,                 change in bowel habits, loss of appetite GU: dysuria, change in color of urine, no urgency or frequency.  flank pain. MS:   +joint pain, stiffness, decreased range of motion, back pain. Neuro-     nothing unusual Psych:  change in mood or affect.  depression or +anxiety.   memory loss.  OBJ- Physical Exam General- Alert, Oriented, Affect-appropriate/ ?drowsy, Distress- none acute, + morbidly obese Skin- rash-none, lesions- none, excoriation- none Lymphadenopathy- none Head- atraumatic            Eyes- Gross vision intact, PERRLA, conjunctivae and secretions clear            Ears- Hearing, canals-normal            Nose- Clear, no-Septal dev, mucus, polyps, erosion, perforation             Throat- Mallampati III , mucosa clear , drainage- none,  tonsils- atrophic, + teeth Neck- flexible , trachea midline, no stridor , thyroid nl, carotid no bruit Chest - symmetrical excursion , unlabored           Heart/CV- RRR , no murmur , no gallop  , no rub, nl s1 s2                           - JVD- none , edema- none, stasis changes- none, varices- none           Lung- clear to P&A, wheeze- none, cough- none , dullness-none, rub- none           Chest wall- +Scar from port access Abd-  Br/ Gen/ Rectal- Not done, not indicated Extrem- cyanosis- none, clubbing, none, atrophy- none, strength- nl, + cane Neuro- +small twitching movements  Assessment and Plan    Obstructive Sleep Apnea Patient is using CPAP with a mustache mask and chin strap. However, there are still 8-9 breakthrough apneas per hour, and patient is not consistently using the machine for at least 4 hours per night. Discussed the possibility of switching to BiPAP in the future for better control. -Encourage consistent use of CPAP for at least 4 hours per night. -Consider short naps and over-the-counter caffeine tablets to manage daytime sleepiness. -Potential future discussion about switching to BiPAP for better control.  Daytime Sleepiness Patient experiences daytime sleepiness, which may be related to inconsistent CPAP use and early morning awakenings. Patient is trying to avoid napping and is consuming caffeine. -Encourage short naps and use of over-the-counter caffeine tablets to manage daytime sleepiness. -Ensure consistent use of CPAP for at least 4 hours per night.  Anxiety/Insomnia Patient is taking Alprazolam (Xanax) to help with sleep. -Continue Alprazolam as prescribed.  Pulmonary Nodules Stable on last x-ray in August. -Order a repeat x-ray to monitor for any changes in the nodules.

## 2023-07-08 ENCOUNTER — Ambulatory Visit (INDEPENDENT_AMBULATORY_CARE_PROVIDER_SITE_OTHER): Payer: Medicare Other

## 2023-07-08 ENCOUNTER — Encounter: Payer: Self-pay | Admitting: Internal Medicine

## 2023-07-08 ENCOUNTER — Ambulatory Visit: Payer: Medicare Other | Admitting: Internal Medicine

## 2023-07-08 ENCOUNTER — Ambulatory Visit (HOSPITAL_BASED_OUTPATIENT_CLINIC_OR_DEPARTMENT_OTHER): Payer: 59 | Admitting: Physical Therapy

## 2023-07-08 VITALS — BP 128/68 | HR 88 | Ht 62.0 in | Wt 265.8 lb

## 2023-07-08 DIAGNOSIS — R911 Solitary pulmonary nodule: Secondary | ICD-10-CM

## 2023-07-08 DIAGNOSIS — G4733 Obstructive sleep apnea (adult) (pediatric): Secondary | ICD-10-CM

## 2023-07-08 NOTE — Patient Instructions (Addendum)
Try to use your CPAP at least 4 hours per night.  It is ok to take short nap if youneed one.  It is ok to take an otc caffeine tablet (like NoDoz or Vivarin)- 1/2 or 1 tab is about like a cup of coffee. You could try one in the early afternoon if you are having trouble staying awake.  Order- CXR   dx lung nodules

## 2023-07-09 ENCOUNTER — Encounter (HOSPITAL_BASED_OUTPATIENT_CLINIC_OR_DEPARTMENT_OTHER): Payer: Self-pay | Admitting: Physical Therapy

## 2023-07-09 ENCOUNTER — Ambulatory Visit (HOSPITAL_BASED_OUTPATIENT_CLINIC_OR_DEPARTMENT_OTHER): Payer: 59 | Attending: Podiatry | Admitting: Physical Therapy

## 2023-07-09 DIAGNOSIS — M25572 Pain in left ankle and joints of left foot: Secondary | ICD-10-CM | POA: Diagnosis present

## 2023-07-09 DIAGNOSIS — M25571 Pain in right ankle and joints of right foot: Secondary | ICD-10-CM | POA: Diagnosis present

## 2023-07-09 DIAGNOSIS — M6281 Muscle weakness (generalized): Secondary | ICD-10-CM | POA: Diagnosis present

## 2023-07-09 DIAGNOSIS — R262 Difficulty in walking, not elsewhere classified: Secondary | ICD-10-CM | POA: Diagnosis present

## 2023-07-09 DIAGNOSIS — M545 Low back pain, unspecified: Secondary | ICD-10-CM | POA: Diagnosis present

## 2023-07-09 NOTE — Therapy (Signed)
 OUTPATIENT PHYSICAL THERAPY LOWER EXTREMITY TREATMENT   Patient Name: Kelsey Wilson MRN: [id number] DOB:02/11/1961, 63 y.o., female Today's Date: 07/09/2023  END OF SESSION:  PT End of Session - 07/09/23 1615     Visit Number 4    Number of Visits 12    Date for PT Re-Evaluation 07/26/23    Authorization Type [employer]    Progress Note Due on Visit 10    PT Start Time 1615    PT Stop Time 1653    PT Time Calculation (min) 38 min              Past Medical History:  Diagnosis Date   Anemia    Anxiety    Arthritis    Breast cancer (HCC) 2010   T3N1 invasive ductal carcinoma left breast.Takes Arimidex  daily   Bursitis    Carpal tunnel syndrome    Chronic back pain    stenosis   Constipation    takes Colace daily   Depression    takes Benzotropine daily   Diverticulitis of colon    Dyspnea    daily when walking for over 1 yr.   Fibromyalgia 08/2012   GERD (gastroesophageal reflux disease)    takes Dexilant  daily   Hemorrhoid    History of blood transfusion    no abnormal reaction noted   History of colon polyps    benign   History of shingles    Joint pain    Joint swelling    Morbid obesity (HCC)    Night muscle spasms    takes Flexeril  nightly as needed   Nocturia    OSA (obstructive sleep apnea)    OSA on CPAP    Peripheral edema    takes Furosemide .Just started 01/18/16   Peripheral neuropathy    takes Lyrica  daily   Personal history of chemotherapy    2013   Personal history of radiation therapy    2013   Pneumonia    hx of-2015   Pseudoarthrosis of lumbar spine    Seasonal allergies    takes Singulair  nightly   SOB (shortness of breath) on exertion    rarely with exertion   Splenorenal shunt malfunction (HCC)    stable splenorenal shunt 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 Marlana)   Type II diabetes mellitus (HCC)    Past Surgical History:  Procedure Laterality Date   ABDOMINAL  HYSTERECTOMY     still has ovaries   APPENDECTOMY     AXILLARY LYMPH NODE DISSECTION  11/28/2011   Procedure: AXILLARY LYMPH NODE DISSECTION;  Surgeon: Morene ONEIDA Olives, MD;  Location: [facility];  Service: General;  Laterality: Left;   BREAST LUMPECTOMY Left 11/28/2011   Malignant   BREAST SURGERY Left 2013   CARPAL TUNNEL RELEASE     Bilateral   CESAREAN SECTION     pt. has had 3   CHOLECYSTECTOMY     COLONOSCOPY     COLONOSCOPY WITH PROPOFOL  N/A 01/24/2021   Procedure: COLONOSCOPY WITH PROPOFOL ;  Surgeon: Legrand Victory LITTIE DOUGLAS, MD;  Location: [facility];  Service: Gastroenterology;  Laterality: N/A;   ESOPHAGOGASTRODUODENOSCOPY (EGD) WITH PROPOFOL  N/A 01/24/2021   Procedure: ESOPHAGOGASTRODUODENOSCOPY (EGD) WITH PROPOFOL ;  Surgeon: Legrand Victory LITTIE DOUGLAS, MD;  Location: [facility];  Service: Gastroenterology;  Laterality: N/A;   EYE SURGERY Bilateral    cataract removal   KNEE SURGERY     Left Knee   LAMINECTOMY WITH POSTERIOR  LATERAL ARTHRODESIS LEVEL 1 N/A 11/29/2017   Procedure: Posterior Lateral Fusion - Lumbar Four-Lumbar Five, removal and replacement of hardware, Laminectomy - Lumbar Four-Lumbar Five;  Surgeon: Joshua Alm RAMAN, MD;  Location: [facility] OR;  Service: Neurosurgery;  Laterality: N/A;   LAMINECTOMY WITH POSTERIOR LATERAL ARTHRODESIS LEVEL 2 N/A 06/07/2017   Procedure: Posterior Lateral Fusion Lumbar Three-Four and Transforaminal Interbody Fusion Lumbar Four-Five with Segmental  Pedicle Screw Fixation;  Surgeon: Joshua Alm RAMAN, MD;  Location: [facility] OR;  Service: Neurosurgery;  Laterality: N/A;  Posterior Lateral Fusion Lumbar Three-Four and Transforaminal Interbody Fusion Lumbar Four-Five with Segmental  Pedicle Screw Fixation    LUMBAR FUSION  06/07/2017   POST   LUMBAR LAMINECTOMY/DECOMPRESSION MICRODISCECTOMY Bilateral 01/27/2016   Procedure: Laminectomy and Foraminotomy - Lumbar four -Lumbar five - bilateral- on-lay noninstrumented fusion;  Surgeon: Alm RAMAN Joshua, MD;  Location: [facility];  Service: Neurosurgery;  Laterality: Bilateral;   MULTIPLE EXTRACTIONS WITH ALVEOLOPLASTY N/A 05/11/2016   Procedure: EXTRACTION OF TEETH EIGHTEEN, TWENTY AND TWENTY- NINE;  REMOVAL OF MANDIBULAR TORUS AND EXOSTOSIS;  Surgeon: Glendia Primrose, DDS;  Location: [facility];  Service: Oral Surgery;  Laterality: N/A;   POLYPECTOMY  01/24/2021   Procedure: POLYPECTOMY;  Surgeon: Legrand Victory LITTIE DOUGLAS, MD;  Location: [facility];  Service: Gastroenterology;;   PORTACATH PLACEMENT  06/18/2011   Procedure: INSERTION PORT-A-CATH;  Surgeon: Morene ONEIDA Olives, MD;  Location: [facility];  Service: General;  Laterality: Right;  right subclavian   removal portacath  2014   spur     Apex spur on both big toes   TOE SURGERY Bilateral    TONSILLECTOMY     TOTAL KNEE ARTHROPLASTY Left 09/13/2014   TOTAL KNEE ARTHROPLASTY Left 09/13/2014   Procedure: LEFT TOTAL KNEE ARTHROPLASTY;  Surgeon: Redell Shoals, MD;  Location: [facility];  Service: Orthopedics;  Laterality: Left;   TOTAL KNEE ARTHROPLASTY Right 03/10/2015   Procedure: RIGHT TOTAL KNEE ARTHROPLASTY;  Surgeon: Redell Shoals, MD;  Location: [facility];  Service: Orthopedics;  Laterality: Right;   Patient Active Problem List   Diagnosis Date Noted   Drug-induced polyneuropathy (HCC) 04/08/2023   Morbid obesity (HCC)    Type II diabetes mellitus (HCC)    GERD (gastroesophageal reflux disease) 06/21/2019   Insomnia 03/03/2019   Ankle arthritis 12/12/2018   Muscle weakness 12/12/2018   Obstructive sleep apnea 10/23/2018   Asthmatic bronchitis 10/23/2018   Cellulitis of internal cheek, left 01/23/2018   Lung nodule 09/15/2017   Morbid obesity with BMI of 45.0-49.9, adult (HCC) 09/05/2017   Arthritis of carpometacarpal ([facility]) joints of both thumbs 03/14/2017   Spinal stenosis, lumbar region, with neurogenic claudication 03/07/2017   Vertigo 10/12/2016   Peripheral positional vertigo 08/28/2016   Abnormal auditory perception of both ears  07/27/2016   Neuropathic pain 06/25/2016   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   S/P lumbar spinal fusion 01/27/2016   H/O therapeutic radiation 09/21/2015   Personal history of breast cancer 09/21/2015   Pes planus of both feet 08/04/2015   Primary osteoarthritis of right knee 03/10/2015   Osteoarthritis of right knee 02/04/2015   Primary osteoarthritis of left knee 09/13/2014   Hot flashes 01/19/2014   Abdominal pain 01/19/2014   Malignant neoplasm of upper-outer quadrant of left breast in female, estrogen receptor positive (HCC) 03/19/2013   Lumbosacral spondylosis without myelopathy 12/30/2012   Fibromyalgia syndrome 08/15/2012   Peripheral neuropathy, toxic 08/15/2012   Lymphedema of arm - left 04/30/2012    PCP: Glade Axe  NP  REFERRING PROVIDER: Gershon Donnice SAUNDERS, DPM   REFERRING DIAG:  [phone] (ICD-10-CM) - Capsulitis of ankle, left  M77.52 (ICD-10-CM) - Tendonitis of ankle, left    THERAPY DIAG:  Pain in left ankle and joints of left foot  Pain in right ankle and joints of right foot  Difficulty in walking, not elsewhere classified  Muscle weakness (generalized)  Lumbosacral pain  Rationale for Evaluation and Treatment: Rehabilitation  ONSET DATE: >5  SUBJECTIVE:   SUBJECTIVE STATEMENT: Pt reports she was able to make it back to the car after last session; had to stop a few times but made it. Pt has goal to return to water aerobics.     Initial subjective Had br cancer 12 years ago hand and feet hurt from the chemo. Have peripheral neuropathies due to DM and chemo.  Also dx with fibromyalgia.  Trying water PT to avoid ankle surgery.  Both ankle feet involvement. Can stand for maybe 15 minutes before needing to sit.  PERTINENT HISTORY: Chronic ankle pain; Lumbar lam and fusion 2023 Spinal stenosis TKR 2016 PAIN:  Are you having pain? Yes: NPRS scale: current 6/10 Pain location: bilat  feet and ankle; lower back Pain description: numbness, achey  heavy, tingling Aggravating factors: sitting in hard chairs Relieving factors: sidelying  PRECAUTIONS: Fall  RED FLAGS: None   WEIGHT BEARING RESTRICTIONS: No  FALLS:  Has patient fallen in last 6 months? No  LIVING ENVIRONMENT: Lives with: lives with their family Lives in: House/apartment Stairs: 2-3 to enter home   OCCUPATION: disabled  PLOF: Independent  PATIENT GOALS: decrease ankle pain, put off ankle surgery  NEXT MD VISIT:   OBJECTIVE:  Note: Objective measures were completed at Evaluation unless otherwise noted.  DIAGNOSTIC FINDINGS: N/a  PATIENT SURVEYS:  FOTO Primary measure 44% with goal of 51%  COGNITION: Overall cognitive status: Within functional limits for tasks assessed     SENSATION: WFL  EDEMA:  Mild edema about bilat ankles    PALPATION: mild global tenderness around the ankle on both medial, lateral aspects, Achilles tendon bilaterally with minimal edema.   LOWER EXTREMITY ROM:  Active ROM Right eval Left eval  Hip flexion    Hip extension    Hip abduction    Hip adduction    Hip internal rotation    Hip external rotation    Knee flexion    Knee extension    Ankle dorsiflexion 5 2  Ankle plantarflexion 16 8  Ankle inversion    Ankle eversion     (Blank rows = not tested)  LOWER EXTREMITY MMT:  MMT Right eval Left eval  Hip flexion 4 4  Hip extension    Hip abduction    Hip adduction    Hip internal rotation    Hip external rotation    Knee flexion 4 4  Knee extension 4 4  Ankle dorsiflexion 3+ P! 3P!  Ankle plantarflexion 3P! 3P!  Ankle inversion Not tolerated Not tolerated  Ankle eversion Not tolerated Not tolerated   (Blank rows = not tested)  LOWER EXTREMITY SPECIAL TESTS:  Ankle special tests: Anterior drawer test: positive  and Thompson's test: positive prone knee bent squeeze calf for sudden pf pos if pain  FUNCTIONAL TESTS:  Timed up and go (TUG): 17.84  GAIT: Distance walked: 400 ft 2 rest  periods Assistive device utilized: None Level of assistance: Modified independence Comments: Short quick steps, minimal heel strike (like walking on egg shells)  TREATMENT  Pt seen for aquatic therapy today.  Treatment took place in water 3.5-4.75 ft in depth at the [facility] pool. Temp of water was 91.  Pt entered/exited the pool via stairs using step-to pattern with hand rail.  *UE on barbell: walking forward/backward x 3 laps -> added row motion with barbell * side stepping with arms on barbell x 1 lap * relaxed squat at wall * wide stance with arm addct/ abdct with rainbow hand floats x 8 * return to side stepping with UE on rainbow hand floats x 2 laps- cues to not lock knees * marching with arm row motion  *seated on lift: cycling; LAQ; ankle df/pf; ankle circles; hip add/abd * UE on wall 4.0 ft: toe/heel raises, Hip abdct/addct x 10; marching x 5  * return to marching with reciprocal row motion with rainbow hand floats  * backwards gait with UE on rainbow floats * R/L hamstring stretch with foot on 2nd step   Pt requires the buoyancy and hydrostatic pressure of water for support, and to offload joints by unweighting joint load by at least 50 % in navel deep water and by at least 75-80% in chest to neck deep water.  Viscosity of the water is needed for resistance of strengthening. Water current perturbations provides challenge to standing balance requiring increased core activation.     PATIENT EDUCATION:  Education details: aquatic therapy exercise progressions/ modifications  Person educated: Patient Education method: Explanation Education comprehension: verbalized understanding  HOME EXERCISE PROGRAM: TBA  ASSESSMENT:  CLINICAL IMPRESSION: Pt tolerated aquatic therapy session well, able to move through water with improved  confidence. She required less rest periods than last visit. She reported reduction of ankle/foot pain to 2/10, until completing toe raises which increased pain again.  Goals ongoing.  Initial impression Patient is a 63 y.o. f who was seen today for physical therapy evaluation and treatment for Left ankle capsulitis and tendonitis.  Pt reports issue bilaterally with initial pain beginning after chemotherapy for br Ca x 12 years ago.  She has residual dysesthesias through LE mid thigh to feet as well as ue throughout wrists and hands.  Has other dx of OA, spinal stenosis and reports recent dx of PAD although I am unable to find notes in chart pertaining.  Pain in feet and ankle likely multifactorial but nonetheless limiting pt functional ability and mobility.  She will  benefit from skilled PT intervention in aquatics to improve deficits and manage pain.  Plan to add land based as approp towards end of certification  OBJECTIVE IMPAIRMENTS: Abnormal gait, decreased activity tolerance, decreased endurance, decreased mobility, difficulty walking, increased muscle spasms, impaired sensation, obesity, and pain.   ACTIVITY LIMITATIONS: standing, squatting, stairs, and locomotion level  PARTICIPATION LIMITATIONS: meal prep, cleaning, laundry, shopping, community activity, and yard work  PERSONAL FACTORS: Past/current experiences, Time since onset of injury/illness/exacerbation, and 3+ comorbidities: see PmHx  are also affecting patient's functional outcome.   REHAB POTENTIAL: Good  CLINICAL DECISION MAKING: Evolving/moderate complexity  EVALUATION COMPLEXITY: Moderate   GOALS: Goals reviewed with patient? Yes  SHORT TERM GOALS: Target date: 07/26/23 Pt will tolerate full aquatic sessions consistently without increase in pain and with improving function to demonstrate good toleration and effectiveness of intervention.  Baseline: Goal status: INITIAL  2.  Pt will tolerate sitting in hard chair  (kitchen) x 30 minutes without increased pain Baseline: 30 with increase in pain Goal status: INITIAL  3.  Pt will report tolerating standing >  20 minutes to cook or get through grocery store Baseline: 15 max Goal status: INITIAL  4.  Pt will improve ankle ROM by 50% Baseline: see chart Goal status: INITIAL  5.  Pt will be indep with final HEP's (land and aquatic as appropriate) for continued management of condition Baseline:  Goal status: INITIAL  6.  Pt will report decrease in worst ankle/foot pain 3 NPRS for improved toleration to activity Baseline: 10/10 Goal status: INITIAL  LONG TERM GOALS: To be set at re-cert as approp   PLAN:  PT FREQUENCY: 1-2x/week  PT DURATION: 6 weeksaquatic and land  PLANNED INTERVENTIONS: 97164- PT Re-evaluation, 97110-Therapeutic exercises, 97530- Therapeutic activity, [id number]- Neuromuscular re-education, 97535- Self Care, [id number]- Manual therapy, [id number]- Gait training, [phone]- Orthotic Fit/training, [id number]- Aquatic Therapy, 97014- Electrical stimulation (unattended), [phone]- Ionotophoresis 4mg /ml Dexamethasone , Patient/Family education, Balance training, Stair training, Taping, Dry Needling, Joint mobilization, DME instructions, Cryotherapy, and Moist heat  PLAN FOR NEXT SESSION: aquatic: ankle ROM and strengthening. Balance retraining. Pain management  Delon Aquas, PTA 07/09/23 5:45 PM [facility] [address] [place], [place], [address] Phone: [phone]   Fax:  [phone]

## 2023-07-11 ENCOUNTER — Encounter (HOSPITAL_BASED_OUTPATIENT_CLINIC_OR_DEPARTMENT_OTHER): Payer: Self-pay | Admitting: Physical Therapy

## 2023-07-11 ENCOUNTER — Ambulatory Visit (HOSPITAL_BASED_OUTPATIENT_CLINIC_OR_DEPARTMENT_OTHER): Payer: 59 | Admitting: Physical Therapy

## 2023-07-11 DIAGNOSIS — R262 Difficulty in walking, not elsewhere classified: Secondary | ICD-10-CM

## 2023-07-11 DIAGNOSIS — M25571 Pain in right ankle and joints of right foot: Secondary | ICD-10-CM

## 2023-07-11 DIAGNOSIS — M6281 Muscle weakness (generalized): Secondary | ICD-10-CM

## 2023-07-11 DIAGNOSIS — M25572 Pain in left ankle and joints of left foot: Secondary | ICD-10-CM | POA: Diagnosis not present

## 2023-07-11 NOTE — Therapy (Signed)
 OUTPATIENT PHYSICAL THERAPY LOWER EXTREMITY TREATMENT   Patient Name: Kelsey Wilson MRN: [id number] DOB:Apr 18, 1961, 63 y.o., female Today's Date: 07/11/2023  END OF SESSION:  PT End of Session - 07/11/23 1634     Visit Number 5    Number of Visits 12    Date for PT Re-Evaluation 07/26/23    Authorization Type [employer]    Progress Note Due on Visit 10    PT Start Time 1621    PT Stop Time 1659    PT Time Calculation (min) 38 min    Activity Tolerance Patient tolerated treatment well    Behavior During Therapy WFL for tasks assessed/performed              Past Medical History:  Diagnosis Date   Anemia    Anxiety    Arthritis    Breast cancer (HCC) 2010   T3N1 invasive ductal carcinoma left breast.Takes Arimidex  daily   Bursitis    Carpal tunnel syndrome    Chronic back pain    stenosis   Constipation    takes Colace daily   Depression    takes Benzotropine daily   Diverticulitis of colon    Dyspnea    daily when walking for over 1 yr.   Fibromyalgia 08/2012   GERD (gastroesophageal reflux disease)    takes Dexilant  daily   Hemorrhoid    History of blood transfusion    no abnormal reaction noted   History of colon polyps    benign   History of shingles    Joint pain    Joint swelling    Morbid obesity (HCC)    Night muscle spasms    takes Flexeril  nightly as needed   Nocturia    OSA (obstructive sleep apnea)    OSA on CPAP    Peripheral edema    takes Furosemide .Just started 01/18/16   Peripheral neuropathy    takes Lyrica  daily   Personal history of chemotherapy    2013   Personal history of radiation therapy    2013   Pneumonia    hx of-2015   Pseudoarthrosis of lumbar spine    Seasonal allergies    takes Singulair  nightly   SOB (shortness of breath) on exertion    rarely with exertion   Splenorenal shunt malfunction (HCC)    stable splenorenal shunt 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 Marlana)   Type II diabetes mellitus (HCC)    Past Surgical History:  Procedure Laterality Date   ABDOMINAL HYSTERECTOMY     still has ovaries   APPENDECTOMY     AXILLARY LYMPH NODE DISSECTION  11/28/2011   Procedure: AXILLARY LYMPH NODE DISSECTION;  Surgeon: Morene ONEIDA Olives, MD;  Location: [facility];  Service: General;  Laterality: Left;   BREAST LUMPECTOMY Left 11/28/2011   Malignant   BREAST SURGERY Left 2013   CARPAL TUNNEL RELEASE     Bilateral   CESAREAN SECTION     pt. has had 3   CHOLECYSTECTOMY     COLONOSCOPY     COLONOSCOPY WITH PROPOFOL  N/A 01/24/2021   Procedure: COLONOSCOPY WITH PROPOFOL ;  Surgeon: Legrand Victory LITTIE DOUGLAS, MD;  Location: [facility];  Service: Gastroenterology;  Laterality: N/A;   ESOPHAGOGASTRODUODENOSCOPY (EGD) WITH PROPOFOL  N/A 01/24/2021   Procedure: ESOPHAGOGASTRODUODENOSCOPY (EGD) WITH PROPOFOL ;  Surgeon: Legrand Victory LITTIE DOUGLAS, MD;  Location: [facility];  Service: Gastroenterology;  Laterality: N/A;   EYE SURGERY Bilateral  cataract removal   KNEE SURGERY     Left Knee   LAMINECTOMY WITH POSTERIOR LATERAL ARTHRODESIS LEVEL 1 N/A 11/29/2017   Procedure: Posterior Lateral Fusion - Lumbar Four-Lumbar Five, removal and replacement of hardware, Laminectomy - Lumbar Four-Lumbar Five;  Surgeon: Joshua Alm RAMAN, MD;  Location: [facility];  Service: Neurosurgery;  Laterality: N/A;   LAMINECTOMY WITH POSTERIOR LATERAL ARTHRODESIS LEVEL 2 N/A 06/07/2017   Procedure: Posterior Lateral Fusion Lumbar Three-Four and Transforaminal Interbody Fusion Lumbar Four-Five with Segmental  Pedicle Screw Fixation;  Surgeon: Joshua Alm RAMAN, MD;  Location: [facility] OR;  Service: Neurosurgery;  Laterality: N/A;  Posterior Lateral Fusion Lumbar Three-Four and Transforaminal Interbody Fusion Lumbar Four-Five with Segmental  Pedicle Screw Fixation    LUMBAR FUSION  06/07/2017   POST   LUMBAR LAMINECTOMY/DECOMPRESSION MICRODISCECTOMY Bilateral 01/27/2016   Procedure: Laminectomy and Foraminotomy  - Lumbar four -Lumbar five - bilateral- on-lay noninstrumented fusion;  Surgeon: Alm RAMAN Joshua, MD;  Location: [facility];  Service: Neurosurgery;  Laterality: Bilateral;   MULTIPLE EXTRACTIONS WITH ALVEOLOPLASTY N/A 05/11/2016   Procedure: EXTRACTION OF TEETH EIGHTEEN, TWENTY AND TWENTY- NINE;  REMOVAL OF MANDIBULAR TORUS AND EXOSTOSIS;  Surgeon: Glendia Primrose, DDS;  Location: [facility];  Service: Oral Surgery;  Laterality: N/A;   POLYPECTOMY  01/24/2021   Procedure: POLYPECTOMY;  Surgeon: Legrand Victory LITTIE DOUGLAS, MD;  Location: [facility];  Service: Gastroenterology;;   PORTACATH PLACEMENT  06/18/2011   Procedure: INSERTION PORT-A-CATH;  Surgeon: Morene ONEIDA Olives, MD;  Location: [facility];  Service: General;  Laterality: Right;  right subclavian   removal portacath  2014   spur     Apex spur on both big toes   TOE SURGERY Bilateral    TONSILLECTOMY     TOTAL KNEE ARTHROPLASTY Left 09/13/2014   TOTAL KNEE ARTHROPLASTY Left 09/13/2014   Procedure: LEFT TOTAL KNEE ARTHROPLASTY;  Surgeon: Redell Shoals, MD;  Location: [facility];  Service: Orthopedics;  Laterality: Left;   TOTAL KNEE ARTHROPLASTY Right 03/10/2015   Procedure: RIGHT TOTAL KNEE ARTHROPLASTY;  Surgeon: Redell Shoals, MD;  Location: [facility];  Service: Orthopedics;  Laterality: Right;   Patient Active Problem List   Diagnosis Date Noted   Drug-induced polyneuropathy (HCC) 04/08/2023   Morbid obesity (HCC)    Type II diabetes mellitus (HCC)    GERD (gastroesophageal reflux disease) 06/21/2019   Insomnia 03/03/2019   Ankle arthritis 12/12/2018   Muscle weakness 12/12/2018   Obstructive sleep apnea 10/23/2018   Asthmatic bronchitis 10/23/2018   Cellulitis of internal cheek, left 01/23/2018   Lung nodule 09/15/2017   Morbid obesity with BMI of 45.0-49.9, adult (HCC) 09/05/2017   Arthritis of carpometacarpal ([facility]) joints of both thumbs 03/14/2017   Spinal stenosis, lumbar region, with neurogenic claudication 03/07/2017    Vertigo 10/12/2016   Peripheral positional vertigo 08/28/2016   Abnormal auditory perception of both ears 07/27/2016   Neuropathic pain 06/25/2016   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   S/P lumbar spinal fusion 01/27/2016   H/O therapeutic radiation 09/21/2015   Personal history of breast cancer 09/21/2015   Pes planus of both feet 08/04/2015   Primary osteoarthritis of right knee 03/10/2015   Osteoarthritis of right knee 02/04/2015   Primary osteoarthritis of left knee 09/13/2014   Hot flashes 01/19/2014   Abdominal pain 01/19/2014   Malignant neoplasm of upper-outer quadrant of left breast in female, estrogen receptor positive (HCC) 03/19/2013   Lumbosacral spondylosis without myelopathy 12/30/2012   Fibromyalgia syndrome 08/15/2012   Peripheral  neuropathy, toxic 08/15/2012   Lymphedema of arm - left 04/30/2012    PCP: Glade Axe NP  REFERRING PROVIDER: Gershon Donnice SAUNDERS, DPM   REFERRING DIAG:  [phone] (ICD-10-CM) - Capsulitis of ankle, left  M77.52 (ICD-10-CM) - Tendonitis of ankle, left    THERAPY DIAG:  Pain in left ankle and joints of left foot  Pain in right ankle and joints of right foot  Difficulty in walking, not elsewhere classified  Muscle weakness (generalized)  Rationale for Evaluation and Treatment: Rehabilitation  ONSET DATE: >5  SUBJECTIVE:   SUBJECTIVE STATEMENT: Pt reports that her ankles / feet are not as tight as when she first started therapy.    POOL ACCESS:  Initial subjective Had br cancer 12 years ago hand and feet hurt from the chemo. Have peripheral neuropathies due to DM and chemo.  Also dx with fibromyalgia.  Trying water PT to avoid ankle surgery.  Both ankle feet involvement. Can stand for maybe 15 minutes before needing to sit.  PERTINENT HISTORY: Chronic ankle pain; Lumbar lam and fusion 2023 Spinal stenosis TKR 2016 PAIN:  Are you having pain? Yes: NPRS scale: current 6/10 Pain location: bilat  feet and ankle; lower  back Pain description: numbness, achey heavy, tingling Aggravating factors: sitting in hard chairs Relieving factors: sidelying  PRECAUTIONS: Fall  RED FLAGS: None   WEIGHT BEARING RESTRICTIONS: No  FALLS:  Has patient fallen in last 6 months? No  LIVING ENVIRONMENT: Lives with: lives with their family Lives in: House/apartment Stairs: 2-3 to enter home   OCCUPATION: disabled  PLOF: Independent  PATIENT GOALS: decrease ankle pain, put off ankle surgery  NEXT MD VISIT:   OBJECTIVE:  Note: Objective measures were completed at Evaluation unless otherwise noted.  DIAGNOSTIC FINDINGS: N/a  PATIENT SURVEYS:  FOTO Primary measure 44% with goal of 51%  COGNITION: Overall cognitive status: Within functional limits for tasks assessed     SENSATION: WFL  EDEMA:  Mild edema about bilat ankles    PALPATION: mild global tenderness around the ankle on both medial, lateral aspects, Achilles tendon bilaterally with minimal edema.   LOWER EXTREMITY ROM:  Active ROM Right eval Left eval  Hip flexion    Hip extension    Hip abduction    Hip adduction    Hip internal rotation    Hip external rotation    Knee flexion    Knee extension    Ankle dorsiflexion 5 2  Ankle plantarflexion 16 8  Ankle inversion    Ankle eversion     (Blank rows = not tested)  LOWER EXTREMITY MMT:  MMT Right eval Left eval  Hip flexion 4 4  Hip extension    Hip abduction    Hip adduction    Hip internal rotation    Hip external rotation    Knee flexion 4 4  Knee extension 4 4  Ankle dorsiflexion 3+ P! 3P!  Ankle plantarflexion 3P! 3P!  Ankle inversion Not tolerated Not tolerated  Ankle eversion Not tolerated Not tolerated   (Blank rows = not tested)  LOWER EXTREMITY SPECIAL TESTS:  Ankle special tests: Anterior drawer test: positive  and Thompson's test: positive prone knee bent squeeze calf for sudden pf pos if pain  FUNCTIONAL TESTS:  Timed up and go (TUG):  17.84  GAIT: Distance walked: 400 ft 2 rest periods Assistive device utilized: None Level of assistance: Modified independence Comments: Short quick steps, minimal heel strike (like walking on egg shells)  TREATMENT  Pt seen for aquatic therapy today.  Treatment took place in water 3.5-4.75 ft in depth at the [facility] pool. Temp of water was 91.  Pt entered/exited the pool via stairs using step-to pattern with hand rail.  *UE on yellow hand floats: walking forward/backward x 3 laps  with row motion * side stepping with UE on yellow hand floats x 1 lap * wide stance with arm addct/ abdct with rainbow hand floats x 10 * side stepping with arm addct with rainbow hand floats x 2 laps * relaxed squat at wall * UE on wall 4.0 ft:  Hip abdct/addct x 10 ; leg swings into hip flexion/ ext x 10 each * return to walking backward/ forward with reciprocal row motion with rainbow hand floats  * TrA set in semi staggered stance with short hollow noodle pull down to thighs * forward step/backward step weight shifts with single hand on wall x 5 each side * trial of bilat gastroc stretch with heels off of 1st step - not tolerated   Pt requires the buoyancy and hydrostatic pressure of water for support, and to offload joints by unweighting joint load by at least 50 % in navel deep water and by at least 75-80% in chest to neck deep water.  Viscosity of the water is needed for resistance of strengthening. Water current perturbations provides challenge to standing balance requiring increased core activation.     PATIENT EDUCATION:  Education details: aquatic therapy exercise progressions/ modifications  Person educated: Patient Education method: Explanation Education comprehension: verbalized understanding  HOME EXERCISE PROGRAM: TBA  ASSESSMENT:  CLINICAL  IMPRESSION: Pt tolerated aquatic therapy session well, able to move through water with improved confidence.  She continues to require UE support on floatation when away from the wall.  She reported gradual reduction of ankle/foot pain during session. Exercise tolerance improving each visit.  Gastroc stretch with heels off of step NOT tolerated; will search for other calf stretches moving forward.  Goals ongoing.  Initial impression Patient is a 63 y.o. f who was seen today for physical therapy evaluation and treatment for Left ankle capsulitis and tendonitis.  Pt reports issue bilaterally with initial pain beginning after chemotherapy for br Ca x 12 years ago.  She has residual dysesthesias through LE mid thigh to feet as well as ue throughout wrists and hands.  Has other dx of OA, spinal stenosis and reports recent dx of PAD although I am unable to find notes in chart pertaining.  Pain in feet and ankle likely multifactorial but nonetheless limiting pt functional ability and mobility.  She will  benefit from skilled PT intervention in aquatics to improve deficits and manage pain.  Plan to add land based as approp towards end of certification  OBJECTIVE IMPAIRMENTS: Abnormal gait, decreased activity tolerance, decreased endurance, decreased mobility, difficulty walking, increased muscle spasms, impaired sensation, obesity, and pain.   ACTIVITY LIMITATIONS: standing, squatting, stairs, and locomotion level  PARTICIPATION LIMITATIONS: meal prep, cleaning, laundry, shopping, community activity, and yard work  PERSONAL FACTORS: Past/current experiences, Time since onset of injury/illness/exacerbation, and 3+ comorbidities: see PmHx  are also affecting patient's functional outcome.   REHAB POTENTIAL: Good  CLINICAL DECISION MAKING: Evolving/moderate complexity  EVALUATION COMPLEXITY: Moderate   GOALS: Goals reviewed with patient? Yes  SHORT TERM GOALS: Target date: 07/26/23 Pt will tolerate full  aquatic sessions consistently without increase in pain and with improving function to demonstrate good toleration and effectiveness of intervention.  Baseline: Goal status:  INITIAL  2.  Pt will tolerate sitting in hard chair (kitchen) x 30 minutes without increased pain Baseline: 30 with increase in pain Goal status: INITIAL  3.  Pt will report tolerating standing > 20 minutes to cook or get through grocery store Baseline: 15 max Goal status: INITIAL  4.  Pt will improve ankle ROM by 50% Baseline: see chart Goal status: INITIAL  5.  Pt will be indep with final HEP's (land and aquatic as appropriate) for continued management of condition Baseline:  Goal status: INITIAL  6.  Pt will report decrease in worst ankle/foot pain 3 NPRS for improved toleration to activity Baseline: 10/10 Goal status: INITIAL  LONG TERM GOALS: To be set at re-cert as approp   PLAN:  PT FREQUENCY: 1-2x/week  PT DURATION: 6 weeksaquatic and land  PLANNED INTERVENTIONS: 97164- PT Re-evaluation, 97110-Therapeutic exercises, 97530- Therapeutic activity, [id number]- Neuromuscular re-education, 97535- Self Care, [id number]- Manual therapy, [id number]- Gait training, [id number]- Orthotic Fit/training, [id number]- Aquatic Therapy, 97014- Electrical stimulation (unattended), [phone]- Ionotophoresis 4mg /ml Dexamethasone , Patient/Family education, Balance training, Stair training, Taping, Dry Needling, Joint mobilization, DME instructions, Cryotherapy, and Moist heat  PLAN FOR NEXT SESSION: aquatic: ankle ROM and strengthening. Balance retraining. Pain management   Delon Aquas, PTA 07/11/23 6:07 PM [facility] [address] [place], [place], [address] Phone: [phone]   Fax:  [phone]

## 2023-07-12 ENCOUNTER — Encounter: Payer: Self-pay | Admitting: *Deleted

## 2023-07-16 ENCOUNTER — Ambulatory Visit (HOSPITAL_BASED_OUTPATIENT_CLINIC_OR_DEPARTMENT_OTHER): Payer: 59 | Admitting: Physical Therapy

## 2023-07-16 ENCOUNTER — Encounter (HOSPITAL_BASED_OUTPATIENT_CLINIC_OR_DEPARTMENT_OTHER): Payer: Self-pay | Admitting: Physical Therapy

## 2023-07-16 DIAGNOSIS — M25572 Pain in left ankle and joints of left foot: Secondary | ICD-10-CM | POA: Diagnosis not present

## 2023-07-16 DIAGNOSIS — R262 Difficulty in walking, not elsewhere classified: Secondary | ICD-10-CM

## 2023-07-16 DIAGNOSIS — M6281 Muscle weakness (generalized): Secondary | ICD-10-CM

## 2023-07-16 DIAGNOSIS — M25571 Pain in right ankle and joints of right foot: Secondary | ICD-10-CM

## 2023-07-16 NOTE — Therapy (Signed)
OUTPATIENT PHYSICAL THERAPY LOWER EXTREMITY TREATMENT   Patient Name: Kelsey Wilson MRN: [id number] DOB:1961-04-05, 63 y.o., female Today's Date: 07/16/2023  END OF SESSION:  PT End of Session - 07/16/23 1649     Visit Number 6    Number of Visits 12    Date for PT Re-Evaluation 07/26/23    Authorization Type [employer]    Progress Note Due on Visit 10    PT Start Time 1615    PT Stop Time 1655    PT Time Calculation (min) 40 min    Activity Tolerance Patient tolerated treatment well    Behavior During Therapy WFL for tasks assessed/performed               Past Medical History:  Diagnosis Date   Anemia    Anxiety    Arthritis    Breast cancer (HCC) 2010   T3N1 invasive ductal carcinoma left breast.Takes Arimidex daily   Bursitis    Carpal tunnel syndrome    Chronic back pain    stenosis   Constipation    takes Colace daily   Depression    takes Benzotropine daily   Diverticulitis of colon    Dyspnea    daily when walking for over 1 yr.   Fibromyalgia 08/2012   GERD (gastroesophageal reflux disease)    takes Dexilant daily   Hemorrhoid    History of blood transfusion    no abnormal reaction noted   History of colon polyps    benign   History of shingles    Joint pain    Joint swelling    Morbid obesity (HCC)    Night muscle spasms    takes Flexeril nightly as needed   Nocturia    OSA (obstructive sleep apnea)    OSA on CPAP    Peripheral edema    takes Furosemide.Just started 01/18/16   Peripheral neuropathy    takes Lyrica daily   Personal history of chemotherapy    2013   Personal history of radiation therapy    2013   Pneumonia    hx of-2015   Pseudoarthrosis of lumbar spine    Seasonal allergies    takes Singulair nightly   SOB (shortness of breath) on exertion    rarely with exertion   Splenorenal shunt malfunction (HCC)    stable splenorenal shunt 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 Midge Aver)   Type II diabetes mellitus (HCC)    Past Surgical History:  Procedure Laterality Date   ABDOMINAL HYSTERECTOMY     still has ovaries   APPENDECTOMY     AXILLARY LYMPH NODE DISSECTION  11/28/2011   Procedure: AXILLARY LYMPH NODE DISSECTION;  Surgeon: Mariella Saa, MD;  Location: [facility];  Service: General;  Laterality: Left;   BREAST LUMPECTOMY Left 11/28/2011   Malignant   BREAST SURGERY Left 2013   CARPAL TUNNEL RELEASE     Bilateral   CESAREAN SECTION     pt. has had 3   CHOLECYSTECTOMY     COLONOSCOPY     COLONOSCOPY WITH PROPOFOL N/A 01/24/2021   Procedure: COLONOSCOPY WITH PROPOFOL;  Surgeon: Sherrilyn Rist, MD;  Location: [facility];  Service: Gastroenterology;  Laterality: N/A;   ESOPHAGOGASTRODUODENOSCOPY (EGD) WITH PROPOFOL N/A 01/24/2021   Procedure: ESOPHAGOGASTRODUODENOSCOPY (EGD) WITH PROPOFOL;  Surgeon: Sherrilyn Rist, MD;  Location: [facility];  Service: Gastroenterology;  Laterality: N/A;   EYE SURGERY Bilateral  cataract removal   KNEE SURGERY     Left Knee   LAMINECTOMY WITH POSTERIOR LATERAL ARTHRODESIS LEVEL 1 N/A 11/29/2017   Procedure: Posterior Lateral Fusion - Lumbar Four-Lumbar Five, removal and replacement of hardware, Laminectomy - Lumbar Four-Lumbar Five;  Surgeon: Tia Alert, MD;  Location: [facility] OR;  Service: Neurosurgery;  Laterality: N/A;   LAMINECTOMY WITH POSTERIOR LATERAL ARTHRODESIS LEVEL 2 N/A 06/07/2017   Procedure: Posterior Lateral Fusion Lumbar Three-Four and Transforaminal Interbody Fusion Lumbar Four-Five with Segmental  Pedicle Screw Fixation;  Surgeon: Tia Alert, MD;  Location: [facility];  Service: Neurosurgery;  Laterality: N/A;  Posterior Lateral Fusion Lumbar Three-Four and Transforaminal Interbody Fusion Lumbar Four-Five with Segmental  Pedicle Screw Fixation    LUMBAR FUSION  06/07/2017   POST   LUMBAR LAMINECTOMY/DECOMPRESSION MICRODISCECTOMY Bilateral 01/27/2016   Procedure: Laminectomy and Foraminotomy  - Lumbar four -Lumbar five - bilateral- on-lay noninstrumented fusion;  Surgeon: Tia Alert, MD;  Location: [facility];  Service: Neurosurgery;  Laterality: Bilateral;   MULTIPLE EXTRACTIONS WITH ALVEOLOPLASTY N/A 05/11/2016   Procedure: EXTRACTION OF TEETH EIGHTEEN, TWENTY AND TWENTY- NINE;  REMOVAL OF MANDIBULAR TORUS AND EXOSTOSIS;  Surgeon: Ocie Doyne, DDS;  Location: [facility];  Service: Oral Surgery;  Laterality: N/A;   POLYPECTOMY  01/24/2021   Procedure: POLYPECTOMY;  Surgeon: Sherrilyn Rist, MD;  Location: [facility];  Service: Gastroenterology;;   PORTACATH PLACEMENT  06/18/2011   Procedure: INSERTION PORT-A-CATH;  Surgeon: Mariella Saa, MD;  Location: [facility];  Service: General;  Laterality: Right;  right subclavian   removal portacath  2014   spur     Apex spur on both big toes   TOE SURGERY Bilateral    TONSILLECTOMY     TOTAL KNEE ARTHROPLASTY Left 09/13/2014   TOTAL KNEE ARTHROPLASTY Left 09/13/2014   Procedure: LEFT TOTAL KNEE ARTHROPLASTY;  Surgeon: Samson Frederic, MD;  Location: [facility];  Service: Orthopedics;  Laterality: Left;   TOTAL KNEE ARTHROPLASTY Right 03/10/2015   Procedure: RIGHT TOTAL KNEE ARTHROPLASTY;  Surgeon: Samson Frederic, MD;  Location: [facility];  Service: Orthopedics;  Laterality: Right;   Patient Active Problem List   Diagnosis Date Noted   Drug-induced polyneuropathy (HCC) 04/08/2023   Morbid obesity (HCC)    Type II diabetes mellitus (HCC)    GERD (gastroesophageal reflux disease) 06/21/2019   Insomnia 03/03/2019   Ankle arthritis 12/12/2018   Muscle weakness 12/12/2018   Obstructive sleep apnea 10/23/2018   Asthmatic bronchitis 10/23/2018   Cellulitis of internal cheek, left 01/23/2018   Lung nodule 09/15/2017   Morbid obesity with BMI of 45.0-49.9, adult (HCC) 09/05/2017   Arthritis of carpometacarpal ([facility]) joints of both thumbs 03/14/2017   Spinal stenosis, lumbar region, with neurogenic claudication 03/07/2017    Vertigo 10/12/2016   Peripheral positional vertigo 08/28/2016   Abnormal auditory perception of both ears 07/27/2016   Neuropathic pain 06/25/2016   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   S/P lumbar spinal fusion 01/27/2016   H/O therapeutic radiation 09/21/2015   Personal history of breast cancer 09/21/2015   Pes planus of both feet 08/04/2015   Primary osteoarthritis of right knee 03/10/2015   Osteoarthritis of right knee 02/04/2015   Primary osteoarthritis of left knee 09/13/2014   Hot flashes 01/19/2014   Abdominal pain 01/19/2014   Malignant neoplasm of upper-outer quadrant of left breast in female, estrogen receptor positive (HCC) 03/19/2013   Lumbosacral spondylosis without myelopathy 12/30/2012   Fibromyalgia syndrome 08/15/2012   Peripheral  neuropathy, toxic 08/15/2012   Lymphedema of arm - left 04/30/2012    PCP: Noreene Filbert NP  REFERRING PROVIDER: Vivi Barrack, DPM   REFERRING DIAG:  [phone] (ICD-10-CM) - Capsulitis of ankle, left  M77.52 (ICD-10-CM) - Tendonitis of ankle, left    THERAPY DIAG:  Pain in left ankle and joints of left foot  Pain in right ankle and joints of right foot  Difficulty in walking, not elsewhere classified  Muscle weakness (generalized)  Rationale for Evaluation and Treatment: Rehabilitation  ONSET DATE: >5  SUBJECTIVE:   SUBJECTIVE STATEMENT: Pt reports that her ankles are puffy and swollen from falling asleep in comfy chair with legs down.  She reports that using the hot tub after session was helpful too.   POOL ACCESS:  Initial subjective Had br cancer 12 years ago hand and feet hurt from the chemo. Have peripheral neuropathies due to DM and chemo.  Also dx with fibromyalgia.  Trying water PT to avoid ankle surgery.  Both ankle feet involvement. Can stand for maybe 15 minutes before needing to sit.  PERTINENT HISTORY: Chronic ankle pain; Lumbar lam and fusion 2023 Spinal stenosis TKR 2016 PAIN:  Are you having  pain? Yes: NPRS scale: current 4/10 Pain location: L heel Pain description:  achey  Aggravating factors: sitting in hard chairs Relieving factors: sidelying  PRECAUTIONS: Fall  RED FLAGS: None   WEIGHT BEARING RESTRICTIONS: No  FALLS:  Has patient fallen in last 6 months? No  LIVING ENVIRONMENT: Lives with: lives with their family Lives in: House/apartment Stairs: 2-3 to enter home   OCCUPATION: disabled  PLOF: Independent  PATIENT GOALS: decrease ankle pain, put off ankle surgery  NEXT MD VISIT:   OBJECTIVE:  Note: Objective measures were completed at Evaluation unless otherwise noted.  DIAGNOSTIC FINDINGS: N/a  PATIENT SURVEYS:  FOTO Primary measure 44% with goal of 51%  COGNITION: Overall cognitive status: Within functional limits for tasks assessed     SENSATION: WFL  EDEMA:  Mild edema about bilat ankles    PALPATION: mild global tenderness around the ankle on both medial, lateral aspects, Achilles tendon bilaterally with minimal edema.   LOWER EXTREMITY ROM:  Active ROM Right eval Left eval  Hip flexion    Hip extension    Hip abduction    Hip adduction    Hip internal rotation    Hip external rotation    Knee flexion    Knee extension    Ankle dorsiflexion 5 2  Ankle plantarflexion 16 8  Ankle inversion    Ankle eversion     (Blank rows = not tested)  LOWER EXTREMITY MMT:  MMT Right eval Left eval  Hip flexion 4 4  Hip extension    Hip abduction    Hip adduction    Hip internal rotation    Hip external rotation    Knee flexion 4 4  Knee extension 4 4  Ankle dorsiflexion 3+ P! 3P!  Ankle plantarflexion 3P! 3P!  Ankle inversion Not tolerated Not tolerated  Ankle eversion Not tolerated Not tolerated   (Blank rows = not tested)  LOWER EXTREMITY SPECIAL TESTS:  Ankle special tests: Anterior drawer test: positive  and Thompson's test: positive prone knee bent squeeze calf for sudden pf pos if pain  FUNCTIONAL TESTS:  Timed  up and go (TUG): 17.84  GAIT: Distance walked: 400 ft 2 rest periods Assistive device utilized: None Level of assistance: Modified independence Comments: Short quick steps, minimal heel strike (like walking  on egg shells)                                                                                                                                TREATMENT  Pt seen for aquatic therapy today.  Treatment took place in water 3.5-4.75 ft in depth at the [facility] pool. Temp of water was 91.  Pt entered/exited the pool via stairs using step-to pattern with hand rail.  *UE on barbell: walking forward/backward x 3 laps with row motion * side stepping with arm addct/abdct with rainbow hand floats x 2 laps * high knee marching in place * UE on wall 4.0 ft:  Hip abdct/addct x 10 ; Hip ext x 10 each; heel raises x 10 * straddling yellow noodle and holding corner: cycling; hip abdct/ addct  * return to walking backward/ forward with reciprocal row motion with barbell * forward walking (small) kicks x 2 laps with UE On barbell * forward step up x 1 with RLE/ LLE  (some cramping in LEs - stopped)   Pt requires the buoyancy and hydrostatic pressure of water for support, and to offload joints by unweighting joint load by at least 50 % in navel deep water and by at least 75-80% in chest to neck deep water.  Viscosity of the water is needed for resistance of strengthening. Water current perturbations provides challenge to standing balance requiring increased core activation.     PATIENT EDUCATION:  Education details: aquatic therapy exercise progressions/ modifications  Person educated: Patient Education method: Explanation Education comprehension: verbalized understanding  HOME EXERCISE PROGRAM: TBA  ASSESSMENT:  CLINICAL IMPRESSION: Pt did not take any rest breaks during session. She continues to require UE support on floatation when away from the wall.  She reported gradual  reduction of ankle/foot pain during session. Exercise tolerance improving each visit.  Some mild cramping in LEs reported towards end of session with kicks and stairs. Pt has met STG 1   Initial impression Patient is a 63 y.o. f who was seen today for physical therapy evaluation and treatment for Left ankle capsulitis and tendonitis.  Pt reports issue bilaterally with initial pain beginning after chemotherapy for br Ca x 12 years ago.  She has residual dysesthesias through LE mid thigh to feet as well as ue throughout wrists and hands.  Has other dx of OA, spinal stenosis and reports recent dx of PAD although I am unable to find notes in chart pertaining.  Pain in feet and ankle likely multifactorial but nonetheless limiting pt functional ability and mobility.  She will  benefit from skilled PT intervention in aquatics to improve deficits and manage pain.  Plan to add land based as approp towards end of certification  OBJECTIVE IMPAIRMENTS: Abnormal gait, decreased activity tolerance, decreased endurance, decreased mobility, difficulty walking, increased muscle spasms, impaired sensation, obesity, and pain.   ACTIVITY LIMITATIONS: standing, squatting, stairs, and locomotion level  PARTICIPATION LIMITATIONS: meal  prep, cleaning, laundry, shopping, community activity, and yard work  PERSONAL FACTORS: Past/current experiences, Time since onset of injury/illness/exacerbation, and 3+ comorbidities: see PmHx  are also affecting patient's functional outcome.   REHAB POTENTIAL: Good  CLINICAL DECISION MAKING: Evolving/moderate complexity  EVALUATION COMPLEXITY: Moderate   GOALS: Goals reviewed with patient? Yes  SHORT TERM GOALS: Target date: 07/26/23 Pt will tolerate full aquatic sessions consistently without increase in pain and with improving function to demonstrate good toleration and effectiveness of intervention.  Baseline: Goal status: MET - 07/16/23  2.  Pt will tolerate sitting in hard  chair (kitchen) x 30 minutes without increased pain Baseline: 30 with increase in pain Goal status: INITIAL  3.  Pt will report tolerating standing > 20 minutes to cook or get through grocery store Baseline: 15 max Goal status: INITIAL  4.  Pt will improve ankle ROM by 50% Baseline: see chart Goal status: INITIAL  5.  Pt will be indep with final HEP's (land and aquatic as appropriate) for continued management of condition Baseline:  Goal status: INITIAL  6.  Pt will report decrease in worst ankle/foot pain 3 NPRS for improved toleration to activity Baseline: 10/10 Goal status: INITIAL  LONG TERM GOALS: To be set at re-cert as approp   PLAN:  PT FREQUENCY: 1-2x/week  PT DURATION: 6 weeksaquatic and land  PLANNED INTERVENTIONS: 97164- PT Re-evaluation, 97110-Therapeutic exercises, 97530- Therapeutic activity, [id number]- Neuromuscular re-education, 97535- Self Care, 74259- Manual therapy, [id number]- Gait training, [id number]- Orthotic Fit/training, [id number]- Aquatic Therapy, 97014- Electrical stimulation (unattended), [phone]- Ionotophoresis 4mg /ml Dexamethasone, Patient/Family education, Balance training, Stair training, Taping, Dry Needling, Joint mobilization, DME instructions, Cryotherapy, and Moist heat  PLAN FOR NEXT SESSION: aquatic: ankle ROM and strengthening. Balance retraining. Pain management  Mayer Camel, PTA 07/16/23 5:14 PM [facility] [address] [place], [place], [address] Phone: [phone]   Fax:  [phone]

## 2023-07-18 ENCOUNTER — Ambulatory Visit (HOSPITAL_BASED_OUTPATIENT_CLINIC_OR_DEPARTMENT_OTHER): Payer: 59

## 2023-07-23 ENCOUNTER — Telehealth (HOSPITAL_BASED_OUTPATIENT_CLINIC_OR_DEPARTMENT_OTHER): Payer: Self-pay | Admitting: Physical Therapy

## 2023-07-23 NOTE — Telephone Encounter (Signed)
lvm requesting pt call back to r/s aquatic therapy appt on 02.19 due to inclement weather

## 2023-07-24 ENCOUNTER — Ambulatory Visit (HOSPITAL_BASED_OUTPATIENT_CLINIC_OR_DEPARTMENT_OTHER): Payer: 59 | Admitting: Physical Therapy

## 2023-07-26 ENCOUNTER — Ambulatory Visit (HOSPITAL_BASED_OUTPATIENT_CLINIC_OR_DEPARTMENT_OTHER): Payer: 59

## 2023-07-26 ENCOUNTER — Encounter (HOSPITAL_BASED_OUTPATIENT_CLINIC_OR_DEPARTMENT_OTHER): Payer: Self-pay

## 2023-07-26 DIAGNOSIS — M6281 Muscle weakness (generalized): Secondary | ICD-10-CM

## 2023-07-26 DIAGNOSIS — M25572 Pain in left ankle and joints of left foot: Secondary | ICD-10-CM

## 2023-07-26 DIAGNOSIS — M25571 Pain in right ankle and joints of right foot: Secondary | ICD-10-CM

## 2023-07-26 DIAGNOSIS — R262 Difficulty in walking, not elsewhere classified: Secondary | ICD-10-CM

## 2023-07-26 NOTE — Therapy (Signed)
OUTPATIENT PHYSICAL THERAPY LOWER EXTREMITY TREATMENT   Patient Name: Kelsey Wilson MRN: [id number] DOB:23-Jun-1960, 63 y.o., female Today's Date: 07/26/2023  END OF SESSION:  PT End of Session - 07/26/23 1521     Visit Number 7    Number of Visits 12    Date for PT Re-Evaluation 07/26/23    Authorization Type [employer]    Progress Note Due on Visit 10    PT Start Time 1521    PT Stop Time 1600    PT Time Calculation (min) 39 min    Activity Tolerance Patient tolerated treatment well    Behavior During Therapy WFL for tasks assessed/performed                Past Medical History:  Diagnosis Date   Anemia    Anxiety    Arthritis    Breast cancer (HCC) 2010   T3N1 invasive ductal carcinoma left breast.Takes Arimidex daily   Bursitis    Carpal tunnel syndrome    Chronic back pain    stenosis   Constipation    takes Colace daily   Depression    takes Benzotropine daily   Diverticulitis of colon    Dyspnea    daily when walking for over 1 yr.   Fibromyalgia 08/2012   GERD (gastroesophageal reflux disease)    takes Dexilant daily   Hemorrhoid    History of blood transfusion    no abnormal reaction noted   History of colon polyps    benign   History of shingles    Joint pain    Joint swelling    Morbid obesity (HCC)    Night muscle spasms    takes Flexeril nightly as needed   Nocturia    OSA (obstructive sleep apnea)    OSA on CPAP    Peripheral edema    takes Furosemide.Just started 01/18/16   Peripheral neuropathy    takes Lyrica daily   Personal history of chemotherapy    2013   Personal history of radiation therapy    2013   Pneumonia    hx of-2015   Pseudoarthrosis of lumbar spine    Seasonal allergies    takes Singulair nightly   SOB (shortness of breath) on exertion    rarely with exertion   Splenorenal shunt malfunction (HCC)    stable splenorenal shunt 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 Midge Aver)   Type II diabetes mellitus (HCC)    Past Surgical History:  Procedure Laterality Date   ABDOMINAL HYSTERECTOMY     still has ovaries   APPENDECTOMY     AXILLARY LYMPH NODE DISSECTION  11/28/2011   Procedure: AXILLARY LYMPH NODE DISSECTION;  Surgeon: Mariella Saa, MD;  Location: [facility] OR;  Service: General;  Laterality: Left;   BREAST LUMPECTOMY Left 11/28/2011   Malignant   BREAST SURGERY Left 2013   CARPAL TUNNEL RELEASE     Bilateral   CESAREAN SECTION     pt. has had 3   CHOLECYSTECTOMY     COLONOSCOPY     COLONOSCOPY WITH PROPOFOL N/A 01/24/2021   Procedure: COLONOSCOPY WITH PROPOFOL;  Surgeon: Sherrilyn Rist, MD;  Location: [facility];  Service: Gastroenterology;  Laterality: N/A;   ESOPHAGOGASTRODUODENOSCOPY (EGD) WITH PROPOFOL N/A 01/24/2021   Procedure: ESOPHAGOGASTRODUODENOSCOPY (EGD) WITH PROPOFOL;  Surgeon: Sherrilyn Rist, MD;  Location: [facility];  Service: Gastroenterology;  Laterality: N/A;   EYE SURGERY  Bilateral    cataract removal   KNEE SURGERY     Left Knee   LAMINECTOMY WITH POSTERIOR LATERAL ARTHRODESIS LEVEL 1 N/A 11/29/2017   Procedure: Posterior Lateral Fusion - Lumbar Four-Lumbar Five, removal and replacement of hardware, Laminectomy - Lumbar Four-Lumbar Five;  Surgeon: Tia Alert, MD;  Location[facility] OR;  Service: Neurosurgery;  Laterality: N/A;   LAMINECTOMY WITH POSTERIOR LATERAL ARTHRODESIS LEVEL 2 N/A 06/07/2017   Procedure: Posterior Lateral Fusion Lumbar Three-Four and Transforaminal Interbody Fusion Lumbar Four-Five with Segmental  Pedicle Screw Fixation;  Surgeon: Tia Alert, MD;  Location: [facility] OR;  Service: Neurosurgery;  Laterality: N/A;  Posterior Lateral Fusion Lumbar Three-Four and Transforaminal Interbody Fusion Lumbar Four-Five with Segmental  Pedicle Screw Fixation    LUMBAR FUSION  06/07/2017   POST   LUMBAR LAMINECTOMY/DECOMPRESSION MICRODISCECTOMY Bilateral 01/27/2016   Procedure: Laminectomy and  Foraminotomy - Lumbar four -Lumbar five - bilateral- on-lay noninstrumented fusion;  Surgeon: Tia Alert, MD;  Location: [facility];  Service: Neurosurgery;  Laterality: Bilateral;   MULTIPLE EXTRACTIONS WITH ALVEOLOPLASTY N/A 05/11/2016   Procedure: EXTRACTION OF TEETH EIGHTEEN, TWENTY AND TWENTY- NINE;  REMOVAL OF MANDIBULAR TORUS AND EXOSTOSIS;  Surgeon: Ocie Doyne, DDS;  Location: [facility] OR;  Service: Oral Surgery;  Laterality: N/A;   POLYPECTOMY  01/24/2021   Procedure: POLYPECTOMY;  Surgeon: Sherrilyn Rist, MD;  Location: [facility];  Service: Gastroenterology;;   PORTACATH PLACEMENT  06/18/2011   Procedure: INSERTION PORT-A-CATH;  Surgeon: Mariella Saa, MD;  Location: [facility];  Service: General;  Laterality: Right;  right subclavian   removal portacath  2014   spur     Apex spur on both big toes   TOE SURGERY Bilateral    TONSILLECTOMY     TOTAL KNEE ARTHROPLASTY Left 09/13/2014   TOTAL KNEE ARTHROPLASTY Left 09/13/2014   Procedure: LEFT TOTAL KNEE ARTHROPLASTY;  Surgeon: Samson Frederic, MD;  Location: [facility] OR;  Service: Orthopedics;  Laterality: Left;   TOTAL KNEE ARTHROPLASTY Right 03/10/2015   Procedure: RIGHT TOTAL KNEE ARTHROPLASTY;  Surgeon: Samson Frederic, MD;  Location: [facility];  Service: Orthopedics;  Laterality: Right;   Patient Active Problem List   Diagnosis Date Noted   Drug-induced polyneuropathy (HCC) 04/08/2023   Morbid obesity (HCC)    Type II diabetes mellitus (HCC)    GERD (gastroesophageal reflux disease) 06/21/2019   Insomnia 03/03/2019   Ankle arthritis 12/12/2018   Muscle weakness 12/12/2018   Obstructive sleep apnea 10/23/2018   Asthmatic bronchitis 10/23/2018   Cellulitis of internal cheek, left 01/23/2018   Lung nodule 09/15/2017   Morbid obesity with BMI of 45.0-49.9, adult (HCC) 09/05/2017   Arthritis of carpometacarpal ([facility]) joints of both thumbs 03/14/2017   Spinal stenosis, lumbar region, with neurogenic claudication  03/07/2017   Vertigo 10/12/2016   Peripheral positional vertigo 08/28/2016   Abnormal auditory perception of both ears 07/27/2016   Neuropathic pain 06/25/2016   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   S/P lumbar spinal fusion 01/27/2016   H/O therapeutic radiation 09/21/2015   Personal history of breast cancer 09/21/2015   Pes planus of both feet 08/04/2015   Primary osteoarthritis of right knee 03/10/2015   Osteoarthritis of right knee 02/04/2015   Primary osteoarthritis of left knee 09/13/2014   Hot flashes 01/19/2014   Abdominal pain 01/19/2014   Malignant neoplasm of upper-outer quadrant of left breast in female, estrogen receptor positive (HCC) 03/19/2013   Lumbosacral spondylosis without myelopathy 12/30/2012   Fibromyalgia syndrome  08/15/2012   Peripheral neuropathy, toxic 08/15/2012   Lymphedema of arm - left 04/30/2012    PCP: Noreene Filbert NP  REFERRING PROVIDER: Vivi Barrack, DPM   REFERRING DIAG:  2490558531 (ICD-10-CM) - Capsulitis of ankle, left  M77.52 (ICD-10-CM) - Tendonitis of ankle, left    THERAPY DIAG:  Pain in left ankle and joints of left foot  Difficulty in walking, not elsewhere classified  Muscle weakness (generalized)  Pain in right ankle and joints of right foot  Rationale for Evaluation and Treatment: Rehabilitation  ONSET DATE: >5  SUBJECTIVE:   SUBJECTIVE STATEMENT: Pt reports she fell on Wednesday in her home, stating she tripped over something. She landed on her right side and hit her head. Neck is sore from this. Also felt onset of LBP today. Ankles, heels, and toes have been bothering her more since. 8/10 pain at entry. She denies any concussion symptoms.   POOL ACCESS:  Initial subjective Had br cancer 12 years ago hand and feet hurt from the chemo. Have peripheral neuropathies due to DM and chemo.  Also dx with fibromyalgia.  Trying water PT to avoid ankle surgery.  Both ankle feet involvement. Can stand for maybe 15 minutes  before needing to sit.  PERTINENT HISTORY: Chronic ankle pain; Lumbar lam and fusion 2023 Spinal stenosis TKR 2016 PAIN:  Are you having pain? Yes: NPRS scale: current 8/10 Pain location: L heel Pain description:  achey  Aggravating factors: sitting in hard chairs Relieving factors: sidelying  PRECAUTIONS: Fall  RED FLAGS: None   WEIGHT BEARING RESTRICTIONS: No  FALLS:  Has patient fallen in last 6 months? Yes. Number of falls 1 on 2/19  LIVING ENVIRONMENT: Lives with: lives with their family Lives in: House/apartment Stairs: 2-3 to enter home   OCCUPATION: disabled  PLOF: Independent  PATIENT GOALS: decrease ankle pain, put off ankle surgery  NEXT MD VISIT:   OBJECTIVE:  Note: Objective measures were completed at Evaluation unless otherwise noted.  DIAGNOSTIC FINDINGS: N/a  PATIENT SURVEYS:  FOTO Primary measure 44% with goal of 51%  COGNITION: Overall cognitive status: Within functional limits for tasks assessed     SENSATION: WFL  EDEMA:  Mild edema about bilat ankles    PALPATION: mild global tenderness around the ankle on both medial, lateral aspects, Achilles tendon bilaterally with minimal edema.   LOWER EXTREMITY ROM:  Active ROM Right eval Left eval  Hip flexion    Hip extension    Hip abduction    Hip adduction    Hip internal rotation    Hip external rotation    Knee flexion    Knee extension    Ankle dorsiflexion 5 2  Ankle plantarflexion 16 8  Ankle inversion    Ankle eversion     (Blank rows = not tested)  LOWER EXTREMITY MMT:  MMT Right eval Left eval  Hip flexion 4 4  Hip extension    Hip abduction    Hip adduction    Hip internal rotation    Hip external rotation    Knee flexion 4 4  Knee extension 4 4  Ankle dorsiflexion 3+ P! 3P!  Ankle plantarflexion 3P! 3P!  Ankle inversion Not tolerated Not tolerated  Ankle eversion Not tolerated Not tolerated   (Blank rows = not tested)  LOWER EXTREMITY SPECIAL  TESTS:  Ankle special tests: Anterior drawer test: positive  and Thompson's test: positive prone knee bent squeeze calf for sudden pf pos if pain  FUNCTIONAL TESTS:  Timed up and go (TUG): 17.84  GAIT: Distance walked: 400 ft 2 rest periods Assistive device utilized: None Level of assistance: Modified independence Comments: Short quick steps, minimal heel strike (like walking on egg shells)                                                                                                                                TREATMENT  Pt seen for aquatic therapy today.  Treatment took place in water 3.5-4.75 ft in depth at the [facility] pool. Temp of water was 91.  Pt entered/exited the pool via stairs using step-to pattern with hand rail.  *UE on barbell: walking forward/backward x 3 laps with row motion * side stepping with arm addct/abdct with rainbow hand floats x 2 laps * high knee marching in place * UE on wall 4.0 ft:  Hip abdct/addct x 10 ; Hip ext x 10 each; heel raises x 10 * straddling yellow noodle and holding corner: cycling; hip abdct/ addct  * return to walking backward/ forward with reciprocal row motion with barbell * forward walking (small) kicks x 1 lap with UE On barbell   Pt requires the buoyancy and hydrostatic pressure of water for support, and to offload joints by unweighting joint load by at least 50 % in navel deep water and by at least 75-80% in chest to neck deep water.  Viscosity of the water is needed for resistance of strengthening. Water current perturbations provides challenge to standing balance requiring increased core activation.     PATIENT EDUCATION:  Education details: aquatic therapy exercise progressions/ modifications  Person educated: Patient Education method: Explanation Education comprehension: verbalized understanding  HOME EXERCISE PROGRAM: TBA  ASSESSMENT:  CLINICAL IMPRESSION: Pt though she had aquatic today and had  already changed by the time her session started, so proceeded with aquatic therapy today.  Instructed patient to notify MD of recent fall if pain continues/worsens or if concussion related symptoms appear.  She did well with aquatic exercises today.  No increase in pain level.  Some apprehension with straddling noodle in the pool and requires use of pool wall for stability.  Will monitor her increased pain level as we progress.  Patient to have reassessment next visit.   Initial impression Patient is a 63 y.o. f who was seen today for physical therapy evaluation and treatment for Left ankle capsulitis and tendonitis.  Pt reports issue bilaterally with initial pain beginning after chemotherapy for br Ca x 12 years ago.  She has residual dysesthesias through LE mid thigh to feet as well as ue throughout wrists and hands.  Has other dx of OA, spinal stenosis and reports recent dx of PAD although I am unable to find notes in chart pertaining.  Pain in feet and ankle likely multifactorial but nonetheless limiting pt functional ability and mobility.  She will  benefit from skilled PT intervention in aquatics to improve deficits and manage pain.  Plan to add land based as approp towards end of certification  OBJECTIVE IMPAIRMENTS: Abnormal gait, decreased activity tolerance, decreased endurance, decreased mobility, difficulty walking, increased muscle spasms, impaired sensation, obesity, and pain.   ACTIVITY LIMITATIONS: standing, squatting, stairs, and locomotion level  PARTICIPATION LIMITATIONS: meal prep, cleaning, laundry, shopping, community activity, and yard work  PERSONAL FACTORS: Past/current experiences, Time since onset of injury/illness/exacerbation, and 3+ comorbidities: see PmHx  are also affecting patient's functional outcome.   REHAB POTENTIAL: Good  CLINICAL DECISION MAKING: Evolving/moderate complexity  EVALUATION COMPLEXITY: Moderate   GOALS: Goals reviewed with patient?  Yes  SHORT TERM GOALS: Target date: 07/26/23 Pt will tolerate full aquatic sessions consistently without increase in pain and with improving function to demonstrate good toleration and effectiveness of intervention.  Baseline: Goal status: MET - 07/16/23  2.  Pt will tolerate sitting in hard chair (kitchen) x 30 minutes without increased pain Baseline: 30 with increase in pain Goal status: INITIAL  3.  Pt will report tolerating standing > 20 minutes to cook or get through grocery store Baseline: 15 max Goal status: INITIAL  4.  Pt will improve ankle ROM by 50% Baseline: see chart Goal status: INITIAL  5.  Pt will be indep with final HEP's (land and aquatic as appropriate) for continued management of condition Baseline:  Goal status: INITIAL  6.  Pt will report decrease in worst ankle/foot pain 3 NPRS for improved toleration to activity Baseline: 10/10 Goal status: INITIAL  LONG TERM GOALS: To be set at re-cert as approp   PLAN:  PT FREQUENCY: 1-2x/week  PT DURATION: 6 weeksaquatic and land  PLANNED INTERVENTIONS: 97164- PT Re-evaluation, 97110-Therapeutic exercises, 97530- Therapeutic activity, [id number]- Neuromuscular re-education, 97535- Self Care, 16109- Manual therapy, [id number]- Gait training, [phone]- Orthotic Fit/training, [id number]- Aquatic Therapy, 97014- Electrical stimulation (unattended), [phone]- Ionotophoresis 4mg /ml Dexamethasone, Patient/Family education, Balance training, Stair training, Taping, Dry Needling, Joint mobilization, DME instructions, Cryotherapy, and Moist heat  PLAN FOR NEXT SESSION: aquatic: ankle ROM and strengthening. Balance retraining. Pain management  Riki Altes, PTA  07/26/23 5:06 PM [facility] [address] [place], [place], [address] Phone: [phone]   Fax:  [phone]

## 2023-08-07 ENCOUNTER — Ambulatory Visit (HOSPITAL_BASED_OUTPATIENT_CLINIC_OR_DEPARTMENT_OTHER): Payer: 59 | Attending: Podiatry | Admitting: Physical Therapy

## 2023-08-07 ENCOUNTER — Encounter (HOSPITAL_BASED_OUTPATIENT_CLINIC_OR_DEPARTMENT_OTHER): Payer: Self-pay | Admitting: Physical Therapy

## 2023-08-07 DIAGNOSIS — M25571 Pain in right ankle and joints of right foot: Secondary | ICD-10-CM | POA: Diagnosis present

## 2023-08-07 DIAGNOSIS — R262 Difficulty in walking, not elsewhere classified: Secondary | ICD-10-CM | POA: Diagnosis present

## 2023-08-07 DIAGNOSIS — M25572 Pain in left ankle and joints of left foot: Secondary | ICD-10-CM | POA: Insufficient documentation

## 2023-08-07 DIAGNOSIS — M6281 Muscle weakness (generalized): Secondary | ICD-10-CM | POA: Diagnosis present

## 2023-08-07 NOTE — Therapy (Signed)
 OUTPATIENT PHYSICAL THERAPY LOWER EXTREMITY TREATMENT Progress Note/ re-cert Reporting Period 06/12/23 to 08/07/23  See note below for Objective Data and Assessment of Progress/Goals.      Patient Name: Kelsey Wilson MRN: [id number] DOB:May 31, 1961, 63 y.o., female Today's Date: 08/07/2023  END OF SESSION:  PT End of Session - 08/07/23 1611     Visit Number 8    Number of Visits 16    Date for PT Re-Evaluation 09/20/23    Authorization Type [employer]    Progress Note Due on Visit 10    PT Start Time 1531    PT Stop Time 1615    PT Time Calculation (min) 44 min    Activity Tolerance Patient tolerated treatment well    Behavior During Therapy WFL for tasks assessed/performed                 Past Medical History:  Diagnosis Date   Anemia    Anxiety    Arthritis    Breast cancer (HCC) 2010   T3N1 invasive ductal carcinoma left breast.Takes Arimidex daily   Bursitis    Carpal tunnel syndrome    Chronic back pain    stenosis   Constipation    takes Colace daily   Depression    takes Benzotropine daily   Diverticulitis of colon    Dyspnea    daily when walking for over 1 yr.   Fibromyalgia 08/2012   GERD (gastroesophageal reflux disease)    takes Dexilant daily   Hemorrhoid    History of blood transfusion    no abnormal reaction noted   History of colon polyps    benign   History of shingles    Joint pain    Joint swelling    Morbid obesity (HCC)    Night muscle spasms    takes Flexeril nightly as needed   Nocturia    OSA (obstructive sleep apnea)    OSA on CPAP    Peripheral edema    takes Furosemide.Just started 01/18/16   Peripheral neuropathy    takes Lyrica daily   Personal history of chemotherapy    2013   Personal history of radiation therapy    2013   Pneumonia    hx of-2015   Pseudoarthrosis of lumbar spine    Seasonal allergies    takes Singulair nightly   SOB (shortness of breath) on exertion    rarely with exertion   Splenorenal  shunt malfunction (HCC)    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 Midge Aver)   Type II diabetes mellitus (HCC)    Past Surgical History:  Procedure Laterality Date   ABDOMINAL HYSTERECTOMY     still has ovaries   APPENDECTOMY     AXILLARY LYMPH NODE DISSECTION  11/28/2011   Procedure: AXILLARY LYMPH NODE DISSECTION;  Surgeon: Mariella Saa, MD;  Location: [facility] OR;  Service: General;  Laterality: Left;   BREAST LUMPECTOMY Left 11/28/2011   Malignant   BREAST SURGERY Left 2013   CARPAL TUNNEL RELEASE     Bilateral   CESAREAN SECTION     pt. has had 3   CHOLECYSTECTOMY     COLONOSCOPY     COLONOSCOPY WITH PROPOFOL N/A 01/24/2021   Procedure: COLONOSCOPY WITH PROPOFOL;  Surgeon: Sherrilyn Rist, MD;  Location: [facility];  Service: Gastroenterology;  Laterality: N/A;   ESOPHAGOGASTRODUODENOSCOPY (EGD) WITH PROPOFOL N/A 01/24/2021   Procedure: ESOPHAGOGASTRODUODENOSCOPY (EGD)  WITH PROPOFOL;  Surgeon: Sherrilyn Rist, MD;  Location: [facility];  Service: Gastroenterology;  Laterality: N/A;   EYE SURGERY Bilateral    cataract removal   KNEE SURGERY     Left Knee   LAMINECTOMY WITH POSTERIOR LATERAL ARTHRODESIS LEVEL 1 N/A 11/29/2017   Procedure: Posterior Lateral Fusion - Lumbar Four-Lumbar Five, removal and replacement of hardware, Laminectomy - Lumbar Four-Lumbar Five;  Surgeon: Tia Alert, MD;  Location: [facility] OR;  Service: Neurosurgery;  Laterality: N/A;   LAMINECTOMY WITH POSTERIOR LATERAL ARTHRODESIS LEVEL 2 N/A 06/07/2017   Procedure: Posterior Lateral Fusion Lumbar Three-Four and Transforaminal Interbody Fusion Lumbar Four-Five with Segmental  Pedicle Screw Fixation;  Surgeon: Tia Alert, MD;  Location: [facility] OR;  Service: Neurosurgery;  Laterality: N/A;  Posterior Lateral Fusion Lumbar Three-Four and Transforaminal Interbody Fusion Lumbar Four-Five with Segmental  Pedicle Screw Fixation    LUMBAR  FUSION  06/07/2017   POST   LUMBAR LAMINECTOMY/DECOMPRESSION MICRODISCECTOMY Bilateral 01/27/2016   Procedure: Laminectomy and Foraminotomy - Lumbar four -Lumbar five - bilateral- on-lay noninstrumented fusion;  Surgeon: Tia Alert, MD;  Location: [facility];  Service: Neurosurgery;  Laterality: Bilateral;   MULTIPLE EXTRACTIONS WITH ALVEOLOPLASTY N/A 05/11/2016   Procedure: EXTRACTION OF TEETH EIGHTEEN, TWENTY AND TWENTY- NINE;  REMOVAL OF MANDIBULAR TORUS AND EXOSTOSIS;  Surgeon: Ocie Doyne, DDS;  Location: [facility] OR;  Service: Oral Surgery;  Laterality: N/A;   POLYPECTOMY  01/24/2021   Procedure: POLYPECTOMY;  Surgeon: Sherrilyn Rist, MD;  Location: [facility];  Service: Gastroenterology;;   PORTACATH PLACEMENT  06/18/2011   Procedure: INSERTION PORT-A-CATH;  Surgeon: Mariella Saa, MD;  Location: [facility];  Service: General;  Laterality: Right;  right subclavian   removal portacath  2014   spur     Apex spur on both big toes   TOE SURGERY Bilateral    TONSILLECTOMY     TOTAL KNEE ARTHROPLASTY Left 09/13/2014   TOTAL KNEE ARTHROPLASTY Left 09/13/2014   Procedure: LEFT TOTAL KNEE ARTHROPLASTY;  Surgeon: Samson Frederic, MD;  Location: [facility] OR;  Service: Orthopedics;  Laterality: Left;   TOTAL KNEE ARTHROPLASTY Right 03/10/2015   Procedure: RIGHT TOTAL KNEE ARTHROPLASTY;  Surgeon: Samson Frederic, MD;  Location: [facility];  Service: Orthopedics;  Laterality: Right;   Patient Active Problem List   Diagnosis Date Noted   Drug-induced polyneuropathy (HCC) 04/08/2023   Morbid obesity (HCC)    Type II diabetes mellitus (HCC)    GERD (gastroesophageal reflux disease) 06/21/2019   Insomnia 03/03/2019   Ankle arthritis 12/12/2018   Muscle weakness 12/12/2018   Obstructive sleep apnea 10/23/2018   Asthmatic bronchitis 10/23/2018   Cellulitis of internal cheek, left 01/23/2018   Lung nodule 09/15/2017   Morbid obesity with BMI of 45.0-49.9, adult (HCC) 09/05/2017    Arthritis of carpometacarpal (CMC) joints of both thumbs 03/14/2017   Spinal stenosis, lumbar region, with neurogenic claudication 03/07/2017   Vertigo 10/12/2016   Peripheral positional vertigo 08/28/2016   Abnormal auditory perception of both ears 07/27/2016   Neuropathic pain 06/25/2016   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   S/P lumbar spinal fusion 01/27/2016   H/O therapeutic radiation 09/21/2015   Personal history of breast cancer 09/21/2015   Pes planus of both feet 08/04/2015   Primary osteoarthritis of right knee 03/10/2015   Osteoarthritis of right knee 02/04/2015   Primary osteoarthritis of left knee 09/13/2014   Hot flashes 01/19/2014   Abdominal pain 01/19/2014   Malignant neoplasm of  upper-outer quadrant of left breast in female, estrogen receptor positive (HCC) 03/19/2013   Lumbosacral spondylosis without myelopathy 12/30/2012   Fibromyalgia syndrome 08/15/2012   Peripheral neuropathy, toxic 08/15/2012   Lymphedema of arm - left 04/30/2012    PCP: Noreene Filbert NP  REFERRING PROVIDER: Vivi Barrack, DPM   REFERRING DIAG:  [phone] (ICD-10-CM) - Capsulitis of ankle, left  M77.52 (ICD-10-CM) - Tendonitis of ankle, left    THERAPY DIAG:  Pain in left ankle and joints of left foot  Difficulty in walking, not elsewhere classified  Muscle weakness (generalized)  Rationale for Evaluation and Treatment: Rehabilitation  ONSET DATE: >5  SUBJECTIVE:   SUBJECTIVE STATEMENT: Fett and ankles seem to be doing ok.    Pt reports she fell on Wednesday in her home, stating she tripped over something. She landed on her right side and hit her head. Neck is sore from this. Also felt onset of LBP today. Ankles, heels, and toes have been bothering her more since. 8/10 pain at entry. She denies any concussion symptoms.   POOL ACCESS:  Initial subjective Had br cancer 12 years ago hand and feet hurt from the chemo. Have peripheral neuropathies due to DM and chemo.  Also  dx with fibromyalgia.  Trying water PT to avoid ankle surgery.  Both ankle feet involvement. Can stand for maybe 15 minutes before needing to sit.  PERTINENT HISTORY: Chronic ankle pain; Lumbar lam and fusion 2023 Spinal stenosis TKR 2016 PAIN:  Are you having pain? Yes: NPRS scale: current 5/10 Pain location: L heel Pain description:  achey  Aggravating factors: sitting in hard chairs Relieving factors: sidelying  PRECAUTIONS: Fall  RED FLAGS: None   WEIGHT BEARING RESTRICTIONS: No  FALLS:  Has patient fallen in last 6 months? Yes. Number of falls 1 on 2/19  LIVING ENVIRONMENT: Lives with: lives with their family Lives in: House/apartment Stairs: 2-3 to enter home   OCCUPATION: disabled  PLOF: Independent  PATIENT GOALS: decrease ankle pain, put off ankle surgery  NEXT MD VISIT:   OBJECTIVE:  Note: Objective measures were completed at Evaluation unless otherwise noted.  DIAGNOSTIC FINDINGS: N/a  PATIENT SURVEYS:  FOTO Primary measure 44% with goal of 51%  COGNITION: Overall cognitive status: Within functional limits for tasks assessed     SENSATION: WFL  EDEMA:  Mild edema about bilat ankles    PALPATION: mild global tenderness around the ankle on both medial, lateral aspects, Achilles tendon bilaterally with minimal edema.   LOWER EXTREMITY ROM:  Active ROM Right eval Left eval  Hip flexion    Hip extension    Hip abduction    Hip adduction    Hip internal rotation    Hip external rotation    Knee flexion    Knee extension    Ankle dorsiflexion 5 2  Ankle plantarflexion 16 8  Ankle inversion    Ankle eversion     (Blank rows = not tested)  LOWER EXTREMITY MMT:  MMT Right eval Left eval R / L 3/5  Hip flexion 4 4   Hip extension     Hip abduction     Hip adduction     Hip internal rotation     Hip external rotation     Knee flexion 4 4   Knee extension 4 4   Ankle dorsiflexion 3+ P! 3P! 4 / 4-  Ankle plantarflexion 3P! 3P!  4- /4-  Ankle inversion Not tolerated Not tolerated 3/5  Ankle eversion Not tolerated  Not tolerated 3/5   (Blank rows = not tested)  LOWER EXTREMITY SPECIAL TESTS:  Ankle special tests: Anterior drawer test: positive  and Thompson's test: positive prone knee bent squeeze calf for sudden pf pos if pain  FUNCTIONAL TESTS:  Timed up and go (TUG): 17.84  GAIT: Distance walked: 400 ft 2 rest periods Assistive device utilized: None Level of assistance: Modified independence Comments: Short quick steps, minimal heel strike (like walking on egg shells)                                                                                                                                TREATMENT  Re-cert/PN testing Self care  Pt seen for aquatic therapy today.  Treatment took place in water 3.5-4.75 ft in depth at the [facility] pool. Temp of water was 91.  Pt entered/exited the pool via stairs using step-to pattern with hand rail.  *UE on barbell: walking forward/backward with row motion * side stepping with arm addct/abdct  * high knee marching in place *seated on lift: cycling; hip add/abd and hip flex/ext   Pt requires the buoyancy and hydrostatic pressure of water for support, and to offload joints by unweighting joint load by at least 50 % in navel deep water and by at least 75-80% in chest to neck deep water.  Viscosity of the water is needed for resistance of strengthening. Water current perturbations provides challenge to standing balance requiring increased core activation.     PATIENT EDUCATION:  Education details: aquatic therapy exercise progressions/ modifications  Person educated: Patient Education method: Explanation Education comprehension: verbalized understanding  HOME EXERCISE PROGRAM: TBA  ASSESSMENT:  CLINICAL IMPRESSION: PN: Pt has been seen intermittently without consistency due to scheduling conflicts and winter weather. She reports improvenment in  left ankle pain overall. Her strength has improved and she is now able to get through a grocery store but requires seated rest periods.  Her Foto score has significantly dropped and I do  not believe it is accurate depiction of her progress.  She will continue to benefit from skilled PT aquatic therapy intervention to progress towards land based goal.  Continue to plan 2-3 land based session for instruction on HEP.  She is planning on gaining [facility] acces [facility].      Initial impression Patient is a 63 y.o. f who was seen today for physical therapy evaluation and treatment for Left ankle capsulitis and tendonitis.  Pt reports issue bilaterally with initial pain beginning after chemotherapy for br Ca x 12 years ago.  She has residual dysesthesias through LE mid thigh to feet as well as ue throughout wrists and hands.  Has other dx of OA, spinal stenosis and reports recent dx of PAD although I am unable to find notes in chart pertaining.  Pain in feet and ankle likely multifactorial but nonetheless limiting pt functional ability and mobility.  She will  benefit from skilled PT  intervention in aquatics to improve deficits and manage pain.  Plan to add land based as approp towards end of certification  OBJECTIVE IMPAIRMENTS: Abnormal gait, decreased activity tolerance, decreased endurance, decreased mobility, difficulty walking, increased muscle spasms, impaired sensation, obesity, and pain.   ACTIVITY LIMITATIONS: standing, squatting, stairs, and locomotion level  PARTICIPATION LIMITATIONS: meal prep, cleaning, laundry, shopping, community activity, and yard work  PERSONAL FACTORS: Past/current experiences, Time since onset of injury/illness/exacerbation, and 3+ comorbidities: see PmHx  are also affecting patient's functional outcome.   REHAB POTENTIAL: Good  CLINICAL DECISION MAKING: Evolving/moderate complexity  EVALUATION COMPLEXITY: Moderate   GOALS: Goals reviewed with patient? Yes  SHORT  TERM GOALS/LONG TERM GOALS: Target date: 09/20/23 Pt will tolerate full aquatic sessions consistently without increase in pain and with improving function to demonstrate good toleration and effectiveness of intervention.  Baseline: Goal status: MET - 07/16/23  2.  Pt will tolerate sitting in hard chair (kitchen) x 30 minutes without increased pain Baseline: 30 with increase in pain Goal status: In process (no change) 08/07/23  3.  Pt will report tolerating standing > 20 minutes to cook or get through grocery store Baseline: 15 max Goal status: In progress 08/07/23  4.  Pt will improve ankle ROM by 50% Baseline: see chart Goal status: In progress 08/07/23  5.  Pt will be indep with final HEP's (land and aquatic as appropriate) for continued management of condition Baseline:  Goal status: In progress 08/07/23  6.  Pt will report decrease in worst ankle/foot pain 3 NPRS for improved toleration to activity Baseline: 10/10 Goal status: In progress 08/07/23     PLAN:  PT FREQUENCY: 1-2x/week  PT DURATION: 6 weeks aquatic and land  PLANNED INTERVENTIONS: 97164- PT Re-evaluation, 97110-Therapeutic exercises, 97530- Therapeutic activity, 97112- Neuromuscular re-education, 97535- Self Care, 81191- Manual therapy, [phone]- Gait training, [phone]- Orthotic Fit/training, [phone]- Aquatic Therapy, 97014- Electrical stimulation (unattended), [phone]- Ionotophoresis 4mg /ml Dexamethasone, Patient/Family education, Balance training, Stair training, Taping, Dry Needling, Joint mobilization, DME instructions, Cryotherapy, and Moist heat  PLAN FOR NEXT SESSION: aquatic: ankle ROM and strengthening. Balance retraining. Pain management  Rushie Chestnut) Sabrinia Prien MPT 08/07/23 5:42 PM [facility] [address] [place], [place], [address] Phone: [phone]   Fax:  [phone]

## 2023-08-16 ENCOUNTER — Ambulatory Visit (INDEPENDENT_AMBULATORY_CARE_PROVIDER_SITE_OTHER)

## 2023-08-16 ENCOUNTER — Ambulatory Visit (INDEPENDENT_AMBULATORY_CARE_PROVIDER_SITE_OTHER): Admitting: Podiatry

## 2023-08-16 ENCOUNTER — Encounter: Payer: Self-pay | Admitting: Podiatry

## 2023-08-16 DIAGNOSIS — M25571 Pain in right ankle and joints of right foot: Secondary | ICD-10-CM | POA: Diagnosis not present

## 2023-08-16 DIAGNOSIS — M79672 Pain in left foot: Secondary | ICD-10-CM

## 2023-08-16 DIAGNOSIS — M25572 Pain in left ankle and joints of left foot: Secondary | ICD-10-CM

## 2023-08-16 DIAGNOSIS — M79671 Pain in right foot: Secondary | ICD-10-CM

## 2023-08-16 DIAGNOSIS — S93402A Sprain of unspecified ligament of left ankle, initial encounter: Secondary | ICD-10-CM | POA: Diagnosis not present

## 2023-08-16 DIAGNOSIS — M778 Other enthesopathies, not elsewhere classified: Secondary | ICD-10-CM | POA: Diagnosis not present

## 2023-08-16 DIAGNOSIS — S93401A Sprain of unspecified ligament of right ankle, initial encounter: Secondary | ICD-10-CM | POA: Diagnosis not present

## 2023-08-16 MED ORDER — MELOXICAM 7.5 MG PO TABS: 7.5000 mg | ORAL_TABLET | Freq: Every day | ORAL | 0 refills | Status: DC | PRN

## 2023-08-16 NOTE — Progress Notes (Signed)
 Subjective: Chief Complaint  Patient presents with   Foot Pain    RM#14 Patient states had a fall 08/15/2023 and injured both feet and ankles also wants trim.States currently taking all medications listed.   63 year old female presents the office today with above concerns.  She said that she tripped yesterday.  She states she was walking up a step and she hit her toe causing her to fall.  She gets pain to the left foot along the toes 2 through 4 as well as ankle, heel.  On the right side it radiates more to the toes and the ankle.  No recent treatment.  No other injuries at the time of the fall.  Objective: AAO x3, NAD DP/PT pulses palpable bilaterally, CRT less than 3 seconds She has tenderness palpation of the left foot along digits 2, 3, 4.  There is tenderness palpation of the plantar aspect the calcaneus but no pain with lateral compression of the calcaneus.  Mild Fuhs tenderness along the ankle as well.  On the right side she gets tenderness more along the midfoot dorsally described more of a radiating pain.  Flexor, extensor tendons appear to be intact bilaterally.  There is mild chronic appearing edema but there is no erythema or warmth. No pain with calf compression, swelling, warmth, erythema  Assessment: Contusion, sprain  Plan: -All treatment options discussed with the patient including all alternatives, risks, complications.  -X-rays obtained reviewed bilaterally.  Bilateral foot, ankle x-rays were obtained.  No evidence of acute fracture.  Arthritic changes present of the first MTPJ's.  Osteopenia present. -Given her injury recommend mobilization surgical shoe on the left foot with assistance today as well as Tri-Lock brace on the right side. -Ice, elevation. -Prescribed mobic. Discussed side effects of the medication and directed to stop if any are to occur and call the office.  -Symptoms persist repeat x-rays or consider advanced imaging. -She can resume physical therapy next  week if her symptoms are improving. -Follow-up PCP if she is concerned about the reason why she is falling.  Fluid could be foot related as this was a mechanical fall she tripped but she had other falls as well. -Patient encouraged to call the office with any questions, concerns, change in symptoms.   Vivi Barrack DPM

## 2023-08-21 ENCOUNTER — Ambulatory Visit (HOSPITAL_BASED_OUTPATIENT_CLINIC_OR_DEPARTMENT_OTHER): Admitting: Physical Therapy

## 2023-08-22 ENCOUNTER — Ambulatory Visit
Admission: RE | Admit: 2023-08-22 | Discharge: 2023-08-22 | Disposition: A | Discharge status: Home / Self Care | Payer: 59 | Source: Ambulatory Visit | Attending: Nurse Practitioner | Admitting: Nurse Practitioner

## 2023-08-22 DIAGNOSIS — Z1231 Encounter for screening mammogram for malignant neoplasm of breast: Secondary | ICD-10-CM

## 2023-08-23 ENCOUNTER — Encounter (HOSPITAL_BASED_OUTPATIENT_CLINIC_OR_DEPARTMENT_OTHER): Payer: Self-pay | Admitting: Physical Therapy

## 2023-08-23 ENCOUNTER — Ambulatory Visit (HOSPITAL_BASED_OUTPATIENT_CLINIC_OR_DEPARTMENT_OTHER): Admitting: Physical Therapy

## 2023-08-23 DIAGNOSIS — R262 Difficulty in walking, not elsewhere classified: Secondary | ICD-10-CM

## 2023-08-23 DIAGNOSIS — M6281 Muscle weakness (generalized): Secondary | ICD-10-CM

## 2023-08-23 DIAGNOSIS — M25571 Pain in right ankle and joints of right foot: Secondary | ICD-10-CM

## 2023-08-23 DIAGNOSIS — M25572 Pain in left ankle and joints of left foot: Secondary | ICD-10-CM

## 2023-08-23 NOTE — Therapy (Signed)
 Pt arrives too late fro scheduled visit.  She incidentally had written her appt down on her calendar for 5p when the actual appt was at 415p. She is aware of appt for next week and their times.

## 2023-08-26 ENCOUNTER — Ambulatory Visit (HOSPITAL_BASED_OUTPATIENT_CLINIC_OR_DEPARTMENT_OTHER): Admitting: Physical Therapy

## 2023-08-26 ENCOUNTER — Encounter (HOSPITAL_BASED_OUTPATIENT_CLINIC_OR_DEPARTMENT_OTHER): Payer: Self-pay | Admitting: Physical Therapy

## 2023-08-26 ENCOUNTER — Ambulatory Visit: Payer: 59 | Admitting: Registered Nurse

## 2023-08-26 DIAGNOSIS — M6281 Muscle weakness (generalized): Secondary | ICD-10-CM

## 2023-08-26 DIAGNOSIS — M25572 Pain in left ankle and joints of left foot: Secondary | ICD-10-CM | POA: Diagnosis not present

## 2023-08-26 DIAGNOSIS — R262 Difficulty in walking, not elsewhere classified: Secondary | ICD-10-CM

## 2023-08-26 NOTE — Progress Notes (Deleted)
 Subjective:    Patient ID: Kelsey Wilson, female    DOB: Jul 08, 1960, 63 y.o.   MRN: [id number]  HPI   Pain Inventory Average Pain {NUMBERS; 0-10:5044} Pain Right Now {NUMBERS; 0-10:5044} My pain is {PAIN DESCRIPTION:21022940}  In the last 24 hours, has pain interfered with the following? General activity {NUMBERS; 0-10:5044} Relation with others {NUMBERS; 0-10:5044} Enjoyment of life {NUMBERS; 0-10:5044} What TIME of day is your pain at its worst? {time of day:24191} Sleep (in general) {BHH GOOD/FAIR/POOR:22877}  Pain is worse with: {ACTIVITIES:21022942} Pain improves with: {PAIN IMPROVES WUJW:11914782} Relief from Meds: {NUMBERS; 0-10:5044}  Family History  Problem Relation Age of Onset   Hypertension Mother    Diabetes Mother    Hypertension Father    Diabetes Father    Hypertension Sister    Hyperlipidemia Brother    Breast cancer Paternal Aunt    Cancer Paternal Grandmother        unknown   Colon cancer Neg Hx    Parkinson's disease Neg Hx    Social History   Socioeconomic History   Marital status: Single    Spouse name: Not on file   Number of children: 3   Years of education: Not on file   Highest education level: Not on file  Occupational History   Not on file  Tobacco Use   Smoking status: Never   Smokeless tobacco: Never  Vaping Use   Vaping status: Never Used  Substance and Sexual Activity   Alcohol use: No    Alcohol/week: 0.0 standard drinks of alcohol   Drug use: No   Sexual activity: Never    Birth control/protection: Post-menopausal, Surgical  Other Topics Concern   Not on file  Social History Narrative   Not on file   Social Drivers of Health   Financial Resource Strain: Not on file  Food Insecurity: Not on file  Transportation Needs: Not on file  Physical Activity: Not on file  Stress: Not on file  Social Connections: Unknown (10/16/2021)   Received from [facility]   Social Network    Social Network: Not  on file   Past Surgical History:  Procedure Laterality Date   ABDOMINAL HYSTERECTOMY     still has ovaries   APPENDECTOMY     AXILLARY LYMPH NODE DISSECTION  11/28/2011   Procedure: AXILLARY LYMPH NODE DISSECTION;  Surgeon: Mariella Saa, MD;  Location: [facility] OR;  Service: General;  Laterality: Left;   BREAST LUMPECTOMY Left 11/28/2011   Malignant   BREAST SURGERY Left 2013   CARPAL TUNNEL RELEASE     Bilateral   CESAREAN SECTION     pt. has had 3   CHOLECYSTECTOMY     COLONOSCOPY     COLONOSCOPY WITH PROPOFOL N/A 01/24/2021   Procedure: COLONOSCOPY WITH PROPOFOL;  Surgeon: Sherrilyn Rist, MD;  Location: [facility];  Service: Gastroenterology;  Laterality: N/A;   ESOPHAGOGASTRODUODENOSCOPY (EGD) WITH PROPOFOL N/A 01/24/2021   Procedure: ESOPHAGOGASTRODUODENOSCOPY (EGD) WITH PROPOFOL;  Surgeon: Sherrilyn Rist, MD;  Location: [facility];  Service: Gastroenterology;  Laterality: N/A;   EYE SURGERY Bilateral    cataract removal   KNEE SURGERY     Left Knee   LAMINECTOMY WITH POSTERIOR LATERAL ARTHRODESIS LEVEL 1 N/A 11/29/2017   Procedure: Posterior Lateral Fusion - Lumbar Four-Lumbar Five, removal and replacement of hardware, Laminectomy - Lumbar Four-Lumbar Five;  Surgeon: Tia Alert, MD;  Location: [facility] OR;  Service: Neurosurgery;  Laterality: N/A;  LAMINECTOMY WITH POSTERIOR LATERAL ARTHRODESIS LEVEL 2 N/A 06/07/2017   Procedure: Posterior Lateral Fusion Lumbar Three-Four and Transforaminal Interbody Fusion Lumbar Four-Five with Segmental  Pedicle Screw Fixation;  Surgeon: Tia Alert, MD;  Location: [facility] OR;  Service: Neurosurgery;  Laterality: N/A;  Posterior Lateral Fusion Lumbar Three-Four and Transforaminal Interbody Fusion Lumbar Four-Five with Segmental  Pedicle Screw Fixation    LUMBAR FUSION  06/07/2017   POST   LUMBAR LAMINECTOMY/DECOMPRESSION MICRODISCECTOMY Bilateral 01/27/2016   Procedure: Laminectomy and Foraminotomy - Lumbar four -Lumbar five - bilateral-  on-lay noninstrumented fusion;  Surgeon: Tia Alert, MD;  Location: [facility];  Service: Neurosurgery;  Laterality: Bilateral;   MULTIPLE EXTRACTIONS WITH ALVEOLOPLASTY N/A 05/11/2016   Procedure: EXTRACTION OF TEETH EIGHTEEN, TWENTY AND TWENTY- NINE;  REMOVAL OF MANDIBULAR TORUS AND EXOSTOSIS;  Surgeon: Ocie Doyne, DDS;  Location: [facility];  Service: Oral Surgery;  Laterality: N/A;   POLYPECTOMY  01/24/2021   Procedure: POLYPECTOMY;  Surgeon: Sherrilyn Rist, MD;  Location: [facility];  Service: Gastroenterology;;   PORTACATH PLACEMENT  06/18/2011   Procedure: INSERTION PORT-A-CATH;  Surgeon: Mariella Saa, MD;  Location: [facility];  Service: General;  Laterality: Right;  right subclavian   removal portacath  2014   spur     Apex spur on both big toes   TOE SURGERY Bilateral    TONSILLECTOMY     TOTAL KNEE ARTHROPLASTY Left 09/13/2014   TOTAL KNEE ARTHROPLASTY Left 09/13/2014   Procedure: LEFT TOTAL KNEE ARTHROPLASTY;  Surgeon: Samson Frederic, MD;  Location: [facility];  Service: Orthopedics;  Laterality: Left;   TOTAL KNEE ARTHROPLASTY Right 03/10/2015   Procedure: RIGHT TOTAL KNEE ARTHROPLASTY;  Surgeon: Samson Frederic, MD;  Location: [facility];  Service: Orthopedics;  Laterality: Right;   Past Surgical History:  Procedure Laterality Date   ABDOMINAL HYSTERECTOMY     still has ovaries   APPENDECTOMY     AXILLARY LYMPH NODE DISSECTION  11/28/2011   Procedure: AXILLARY LYMPH NODE DISSECTION;  Surgeon: Mariella Saa, MD;  Location: [facility];  Service: General;  Laterality: Left;   BREAST LUMPECTOMY Left 11/28/2011   Malignant   BREAST SURGERY Left 2013   CARPAL TUNNEL RELEASE     Bilateral   CESAREAN SECTION     pt. has had 3   CHOLECYSTECTOMY     COLONOSCOPY     COLONOSCOPY WITH PROPOFOL N/A 01/24/2021   Procedure: COLONOSCOPY WITH PROPOFOL;  Surgeon: Sherrilyn Rist, MD;  Location: [facility];  Service: Gastroenterology;  Laterality: N/A;    ESOPHAGOGASTRODUODENOSCOPY (EGD) WITH PROPOFOL N/A 01/24/2021   Procedure: ESOPHAGOGASTRODUODENOSCOPY (EGD) WITH PROPOFOL;  Surgeon: Sherrilyn Rist, MD;  Location: [facility];  Service: Gastroenterology;  Laterality: N/A;   EYE SURGERY Bilateral    cataract removal   KNEE SURGERY     Left Knee   LAMINECTOMY WITH POSTERIOR LATERAL ARTHRODESIS LEVEL 1 N/A 11/29/2017   Procedure: Posterior Lateral Fusion - Lumbar Four-Lumbar Five, removal and replacement of hardware, Laminectomy - Lumbar Four-Lumbar Five;  Surgeon: Tia Alert, MD;  Location: [facility] OR;  Service: Neurosurgery;  Laterality: N/A;   LAMINECTOMY WITH POSTERIOR LATERAL ARTHRODESIS LEVEL 2 N/A 06/07/2017   Procedure: Posterior Lateral Fusion Lumbar Three-Four and Transforaminal Interbody Fusion Lumbar Four-Five with Segmental  Pedicle Screw Fixation;  Surgeon: Tia Alert, MD;  Location: [facility] OR;  Service: Neurosurgery;  Laterality: N/A;  Posterior Lateral Fusion Lumbar Three-Four and Transforaminal Interbody Fusion Lumbar Four-Five with Segmental  Pedicle Screw Fixation    LUMBAR FUSION  06/07/2017   POST   LUMBAR LAMINECTOMY/DECOMPRESSION MICRODISCECTOMY Bilateral 01/27/2016   Procedure: Laminectomy and Foraminotomy - Lumbar four -Lumbar five - bilateral- on-lay noninstrumented fusion;  Surgeon: Tia Alert, MD;  Location: [facility];  Service: Neurosurgery;  Laterality: Bilateral;   MULTIPLE EXTRACTIONS WITH ALVEOLOPLASTY N/A 05/11/2016   Procedure: EXTRACTION OF TEETH EIGHTEEN, TWENTY AND TWENTY- NINE;  REMOVAL OF MANDIBULAR TORUS AND EXOSTOSIS;  Surgeon: Ocie Doyne, DDS;  Location: [facility];  Service: Oral Surgery;  Laterality: N/A;   POLYPECTOMY  01/24/2021   Procedure: POLYPECTOMY;  Surgeon: Sherrilyn Rist, MD;  Location: [facility];  Service: Gastroenterology;;   PORTACATH PLACEMENT  06/18/2011   Procedure: INSERTION PORT-A-CATH;  Surgeon: Mariella Saa, MD;  Location: [facility];  Service: General;   Laterality: Right;  right subclavian   removal portacath  2014   spur     Apex spur on both big toes   TOE SURGERY Bilateral    TONSILLECTOMY     TOTAL KNEE ARTHROPLASTY Left 09/13/2014   TOTAL KNEE ARTHROPLASTY Left 09/13/2014   Procedure: LEFT TOTAL KNEE ARTHROPLASTY;  Surgeon: Samson Frederic, MD;  Location: [facility];  Service: Orthopedics;  Laterality: Left;   TOTAL KNEE ARTHROPLASTY Right 03/10/2015   Procedure: RIGHT TOTAL KNEE ARTHROPLASTY;  Surgeon: Samson Frederic, MD;  Location: [facility];  Service: Orthopedics;  Laterality: Right;   Past Medical History:  Diagnosis Date   Anemia    Anxiety    Arthritis    Breast cancer (HCC) 2013   T3N1 invasive ductal carcinoma left breast.Takes Arimidex daily   Bursitis    Carpal tunnel syndrome    Chronic back pain    stenosis   Constipation    takes Colace daily   Depression    takes Benzotropine daily   Diverticulitis of colon    Dyspnea    daily when walking for over 1 yr.   Fibromyalgia 08/2012   GERD (gastroesophageal reflux disease)    takes Dexilant daily   Hemorrhoid    History of blood transfusion    no abnormal reaction noted   History of colon polyps    benign   History of shingles    Joint pain    Joint swelling    Morbid obesity (HCC)    Night muscle spasms    takes Flexeril nightly as needed   Nocturia    OSA (obstructive sleep apnea)    OSA on CPAP    Peripheral edema    takes Furosemide.Just started 01/18/16   Peripheral neuropathy    takes Lyrica daily   Personal history of chemotherapy    2013   Personal history of radiation therapy    2013   Pneumonia    hx of-2015   Pseudoarthrosis of lumbar spine    Seasonal allergies    takes Singulair nightly   SOB (shortness of breath) on exertion    rarely with exertion   Splenorenal shunt malfunction (HCC)    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 Midge Aver)   Type II diabetes  mellitus (HCC)    There were no vitals taken for this visit.  Opioid Risk Score:   Fall Risk Score:  `1  Depression screen [facility] 2/9     02/25/2023    9:53 AM 08/08/2022   10:48 AM 08/04/2018   10:45 AM 06/06/2018  9:30 AM 04/04/2018   10:03 AM 01/13/2018   10:08 AM 12/13/2017    9:46 AM  Depression screen [facility] 2/9  Decreased Interest 0 0 0 0 0 3 3  Down, Depressed, Hopeless 0 0 0 0 0 3 3  PHQ - 2 Score 0 0 0 0 0 6 6    Review of Systems     Objective:   Physical Exam        Assessment & Plan:

## 2023-08-26 NOTE — Therapy (Signed)
 OUTPATIENT PHYSICAL THERAPY LOWER EXTREMITY TREATMENT    Patient Name: Kelsey Wilson MRN: [id number] DOB:March 14, 1961, 63 y.o., female Today's Date: 08/26/2023  END OF SESSION:  PT End of Session - 08/26/23 1616     Visit Number 9    Number of Visits 16    Date for PT Re-Evaluation 09/20/23    Authorization Type [employer]    Progress Note Due on Visit 10    PT Start Time 1616    PT Stop Time 1655    PT Time Calculation (min) 39 min    Activity Tolerance Patient tolerated treatment well    Behavior During Therapy WFL for tasks assessed/performed                 Past Medical History:  Diagnosis Date   Anemia    Anxiety    Arthritis    Breast cancer (HCC) 2013   T3N1 invasive ductal carcinoma left breast.Takes Arimidex daily   Bursitis    Carpal tunnel syndrome    Chronic back pain    stenosis   Constipation    takes Colace daily   Depression    takes Benzotropine daily   Diverticulitis of colon    Dyspnea    daily when walking for over 1 yr.   Fibromyalgia 08/2012   GERD (gastroesophageal reflux disease)    takes Dexilant daily   Hemorrhoid    History of blood transfusion    no abnormal reaction noted   History of colon polyps    benign   History of shingles    Joint pain    Joint swelling    Morbid obesity (HCC)    Night muscle spasms    takes Flexeril nightly as needed   Nocturia    OSA (obstructive sleep apnea)    OSA on CPAP    Peripheral edema    takes Furosemide.Just started 01/18/16   Peripheral neuropathy    takes Lyrica daily   Personal history of chemotherapy    2013   Personal history of radiation therapy    2013   Pneumonia    hx of-2015   Pseudoarthrosis of lumbar spine    Seasonal allergies    takes Singulair nightly   SOB (shortness of breath) on exertion    rarely with exertion   Splenorenal shunt malfunction (HCC)    stable splenorenal shunt 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 Midge Aver)   Type II diabetes mellitus (HCC)    Past Surgical History:  Procedure Laterality Date   ABDOMINAL HYSTERECTOMY     still has ovaries   APPENDECTOMY     AXILLARY LYMPH NODE DISSECTION  11/28/2011   Procedure: AXILLARY LYMPH NODE DISSECTION;  Surgeon: Mariella Saa, MD;  Location: [facility];  Service: General;  Laterality: Left;   BREAST LUMPECTOMY Left 11/28/2011   Malignant   BREAST SURGERY Left 2013   CARPAL TUNNEL RELEASE     Bilateral   CESAREAN SECTION     pt. has had 3   CHOLECYSTECTOMY     COLONOSCOPY     COLONOSCOPY WITH PROPOFOL N/A 01/24/2021   Procedure: COLONOSCOPY WITH PROPOFOL;  Surgeon: Sherrilyn Rist, MD;  Location: [facility];  Service: Gastroenterology;  Laterality: N/A;   ESOPHAGOGASTRODUODENOSCOPY (EGD) WITH PROPOFOL N/A 01/24/2021   Procedure: ESOPHAGOGASTRODUODENOSCOPY (EGD) WITH PROPOFOL;  Surgeon: Sherrilyn Rist, MD;  Location: [facility];  Service: Gastroenterology;  Laterality: N/A;  EYE SURGERY Bilateral    cataract removal   KNEE SURGERY     Left Knee   LAMINECTOMY WITH POSTERIOR LATERAL ARTHRODESIS LEVEL 1 N/A 11/29/2017   Procedure: Posterior Lateral Fusion - Lumbar Four-Lumbar Five, removal and replacement of hardware, Laminectomy - Lumbar Four-Lumbar Five;  Surgeon: Tia Alert, MD;  Location: [facility] OR;  Service: Neurosurgery;  Laterality: N/A;   LAMINECTOMY WITH POSTERIOR LATERAL ARTHRODESIS LEVEL 2 N/A 06/07/2017   Procedure: Posterior Lateral Fusion Lumbar Three-Four and Transforaminal Interbody Fusion Lumbar Four-Five with Segmental  Pedicle Screw Fixation;  Surgeon: Tia Alert, MD;  Location: [facility] OR;  Service: Neurosurgery;  Laterality: N/A;  Posterior Lateral Fusion Lumbar Three-Four and Transforaminal Interbody Fusion Lumbar Four-Five with Segmental  Pedicle Screw Fixation    LUMBAR FUSION  06/07/2017   POST   LUMBAR LAMINECTOMY/DECOMPRESSION MICRODISCECTOMY Bilateral 01/27/2016   Procedure: Laminectomy and  Foraminotomy - Lumbar four -Lumbar five - bilateral- on-lay noninstrumented fusion;  Surgeon: Tia Alert, MD;  Location: [facility];  Service: Neurosurgery;  Laterality: Bilateral;   MULTIPLE EXTRACTIONS WITH ALVEOLOPLASTY N/A 05/11/2016   Procedure: EXTRACTION OF TEETH EIGHTEEN, TWENTY AND TWENTY- NINE;  REMOVAL OF MANDIBULAR TORUS AND EXOSTOSIS;  Surgeon: Ocie Doyne, DDS;  Location: [facility] OR;  Service: Oral Surgery;  Laterality: N/A;   POLYPECTOMY  01/24/2021   Procedure: POLYPECTOMY;  Surgeon: Sherrilyn Rist, MD;  Location: [facility];  Service: Gastroenterology;;   PORTACATH PLACEMENT  06/18/2011   Procedure: INSERTION PORT-A-CATH;  Surgeon: Mariella Saa, MD;  Location: [facility];  Service: General;  Laterality: Right;  right subclavian   removal portacath  2014   spur     Apex spur on both big toes   TOE SURGERY Bilateral    TONSILLECTOMY     TOTAL KNEE ARTHROPLASTY Left 09/13/2014   TOTAL KNEE ARTHROPLASTY Left 09/13/2014   Procedure: LEFT TOTAL KNEE ARTHROPLASTY;  Surgeon: Samson Frederic, MD;  Location: [facility];  Service: Orthopedics;  Laterality: Left;   TOTAL KNEE ARTHROPLASTY Right 03/10/2015   Procedure: RIGHT TOTAL KNEE ARTHROPLASTY;  Surgeon: Samson Frederic, MD;  Location: [facility];  Service: Orthopedics;  Laterality: Right;   Patient Active Problem List   Diagnosis Date Noted   Drug-induced polyneuropathy (HCC) 04/08/2023   Morbid obesity (HCC)    Type II diabetes mellitus (HCC)    GERD (gastroesophageal reflux disease) 06/21/2019   Insomnia 03/03/2019   Ankle arthritis 12/12/2018   Muscle weakness 12/12/2018   Obstructive sleep apnea 10/23/2018   Asthmatic bronchitis 10/23/2018   Cellulitis of internal cheek, left 01/23/2018   Lung nodule 09/15/2017   Morbid obesity with BMI of 45.0-49.9, adult (HCC) 09/05/2017   Arthritis of carpometacarpal ([facility]) joints of both thumbs 03/14/2017   Spinal stenosis, lumbar region, with neurogenic claudication  03/07/2017   Vertigo 10/12/2016   Peripheral positional vertigo 08/28/2016   Abnormal auditory perception of both ears 07/27/2016   Neuropathic pain 06/25/2016   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   S/P lumbar spinal fusion 01/27/2016   H/O therapeutic radiation 09/21/2015   Personal history of breast cancer 09/21/2015   Pes planus of both feet 08/04/2015   Primary osteoarthritis of right knee 03/10/2015   Osteoarthritis of right knee 02/04/2015   Primary osteoarthritis of left knee 09/13/2014   Hot flashes 01/19/2014   Abdominal pain 01/19/2014   Malignant neoplasm of upper-outer quadrant of left breast in female, estrogen receptor positive (HCC) 03/19/2013   Lumbosacral spondylosis without myelopathy 12/30/2012  Fibromyalgia syndrome 08/15/2012   Peripheral neuropathy, toxic 08/15/2012   Lymphedema of arm - left 04/30/2012    PCP: Noreene Filbert NP  REFERRING PROVIDER: Vivi Barrack, DPM   REFERRING DIAG:  [phone] (ICD-10-CM) - Capsulitis of ankle, left  M77.52 (ICD-10-CM) - Tendonitis of ankle, left    THERAPY DIAG:  Pain in left ankle and joints of left foot  Difficulty in walking, not elsewhere classified  Muscle weakness (generalized)  Rationale for Evaluation and Treatment: Rehabilitation  ONSET DATE: >5  SUBJECTIVE:   SUBJECTIVE STATEMENT: Pt reports continued pain since falling up steps.  Saw Dr Ardelle Anton and he put a ankle brace left ankle, and shoe on right.  Next appt  April 20      POOL ACCESS:  Initial subjective Had br cancer 12 years ago hand and feet hurt from the chemo. Have peripheral neuropathies due to DM and chemo.  Also dx with fibromyalgia.  Trying water PT to avoid ankle surgery.  Both ankle feet involvement. Can stand for maybe 15 minutes before needing to sit.  PERTINENT HISTORY: Chronic ankle pain; Lumbar lam and fusion 2023 Spinal stenosis TKR 2016 PAIN:  Are you having pain? Yes: NPRS scale: current 7-8/10 Pain location: L  heel/foot and dorsum right Pain description:  achey  Aggravating factors: sitting in hard chairs Relieving factors: sidelying  PRECAUTIONS: Fall  RED FLAGS: None   WEIGHT BEARING RESTRICTIONS: No  FALLS:  Has patient fallen in last 6 months? Yes. Number of falls 1 on 2/19  LIVING ENVIRONMENT: Lives with: lives with their family Lives in: House/apartment Stairs: 2-3 to enter home   OCCUPATION: disabled  PLOF: Independent  PATIENT GOALS: decrease ankle pain, put off ankle surgery  NEXT MD VISIT:   OBJECTIVE:  Note: Objective measures were completed at Evaluation unless otherwise noted.  DIAGNOSTIC FINDINGS: N/a  PATIENT SURVEYS:  FOTO Primary measure 44% with goal of 51%  COGNITION: Overall cognitive status: Within functional limits for tasks assessed     SENSATION: WFL  EDEMA:  Mild edema about bilat ankles    PALPATION: mild global tenderness around the ankle on both medial, lateral aspects, Achilles tendon bilaterally with minimal edema.   LOWER EXTREMITY ROM:  Active ROM Right eval Left eval  Hip flexion    Hip extension    Hip abduction    Hip adduction    Hip internal rotation    Hip external rotation    Knee flexion    Knee extension    Ankle dorsiflexion 5 2  Ankle plantarflexion 16 8  Ankle inversion    Ankle eversion     (Blank rows = not tested)  LOWER EXTREMITY MMT:  MMT Right eval Left eval R / L 3/5  Hip flexion 4 4   Hip extension     Hip abduction     Hip adduction     Hip internal rotation     Hip external rotation     Knee flexion 4 4   Knee extension 4 4   Ankle dorsiflexion 3+ P! 3P! 4 / 4-  Ankle plantarflexion 3P! 3P! 4- /4-  Ankle inversion Not tolerated Not tolerated 3/5  Ankle eversion Not tolerated Not tolerated 3/5   (Blank rows = not tested)  LOWER EXTREMITY SPECIAL TESTS:  Ankle special tests: Anterior drawer test: positive  and Thompson's test: positive prone knee bent squeeze calf for sudden pf pos  if pain  FUNCTIONAL TESTS:  Timed up and go (TUG): 17.84  GAIT: Distance walked: 400 ft 2 rest periods Assistive device utilized: None Level of assistance: Modified independence Comments: Short quick steps, minimal heel strike (like walking on egg shells)                                                                                                                                TREATMENT   Pt seen for aquatic therapy today.  Treatment took place in water 3.5-4.75 ft in depth at the [facility] pool. Temp of water was 91.  Pt entered/exited the pool via stairs using step-to pattern with hand rail.  *UE on barbell: walking forward/backward with row motion * side stepping with arm addct/abdct   - squatted rest period *Ue support wall 4.2 ft: toe raises, heel raises; high knee marching  - squatted rest periods * Toe walking forward/back x 2 widths ea 4.0 ft ue support barbell *seated on lift: cycling; hip add/abd and hip flex/ext   Pt requires the buoyancy and hydrostatic pressure of water for support, and to offload joints by unweighting joint load by at least 50 % in navel deep water and by at least 75-80% in chest to neck deep water.  Viscosity of the water is needed for resistance of strengthening. Water current perturbations provides challenge to standing balance requiring increased core activation.     PATIENT EDUCATION:  Education details: aquatic therapy exercise progressions/ modifications  Person educated: Patient Education method: Explanation Education comprehension: verbalized understanding  HOME EXERCISE PROGRAM: TBA  ASSESSMENT:  CLINICAL IMPRESSION: Pt continues to report increased pain in left as well as right ankle foot residual from fall. She reports compliance with braces. Majority of session completed in 4.66ft or deeper.  She completes all well without complaint but when asked how ankle feel she reports her pain has elevated.  Finished seated on  lift chair with Active ROM bilat ankle and knees. After session she gets water massage in [place] (unbilled) which reduces discomfort.    PN: Pt has been seen intermittently without consistency due to scheduling conflicts and winter weather. She reports improvenment in left ankle pain overall. Her strength has improved and she is now able to get through a grocery store but requires seated rest periods.  Her Foto score has significantly dropped and I do  not believe it is accurate depiction of her progress.  She will continue to benefit from skilled PT aquatic therapy intervention to progress towards land based goal.  Continue to plan 2-3 land based session for instruction on HEP.  She is planning on gaining pool acces at [facility].    OBJECTIVE IMPAIRMENTS: Abnormal gait, decreased activity tolerance, decreased endurance, decreased mobility, difficulty walking, increased muscle spasms, impaired sensation, obesity, and pain.   ACTIVITY LIMITATIONS: standing, squatting, stairs, and locomotion level  PARTICIPATION LIMITATIONS: meal prep, cleaning, laundry, shopping, community activity, and yard work  PERSONAL FACTORS: Past/current experiences, Time since onset of injury/illness/exacerbation, and 3+ comorbidities: see PmHx  are also affecting patient's functional outcome.   REHAB POTENTIAL: Good  CLINICAL DECISION MAKING: Evolving/moderate complexity  EVALUATION COMPLEXITY: Moderate   GOALS: Goals reviewed with patient? Yes  SHORT TERM GOALS/LONG TERM GOALS: Target date: 09/20/23 Pt will tolerate full aquatic sessions consistently without increase in pain and with improving function to demonstrate good toleration and effectiveness of intervention.  Baseline: Goal status: MET - 07/16/23  2.  Pt will tolerate sitting in hard chair (kitchen) x 30 minutes without increased pain Baseline: 30 with increase in pain Goal status: In process (no change) 08/07/23  3.  Pt will report tolerating standing >  20 minutes to cook or get through grocery store Baseline: 15 max Goal status: In progress 08/07/23  4.  Pt will improve ankle ROM by 50% Baseline: see chart Goal status: In progress 08/07/23  5.  Pt will be indep with final HEP's (land and aquatic as appropriate) for continued management of condition Baseline:  Goal status: In progress 08/07/23  6.  Pt will report decrease in worst ankle/foot pain 3 NPRS for improved toleration to activity Baseline: 10/10 Goal status: In progress 08/07/23     PLAN:  PT FREQUENCY: 1-2x/week  PT DURATION: 6 weeks aquatic and land  PLANNED INTERVENTIONS: 97164- PT Re-evaluation, 97110-Therapeutic exercises, 97530- Therapeutic activity, 97112- Neuromuscular re-education, 97535- Self Care, 09604- Manual therapy, [phone]- Gait training, [phone]- Orthotic Fit/training, [phone]- Aquatic Therapy, 97014- Electrical stimulation (unattended), [phone]- Ionotophoresis 4mg /ml Dexamethasone, Patient/Family education, Balance training, Stair training, Taping, Dry Needling, Joint mobilization, DME instructions, Cryotherapy, and Moist heat  PLAN FOR NEXT SESSION: aquatic: ankle ROM and strengthening. Balance retraining. Pain management  Rushie Chestnut) Ahava Kissoon MPT 08/26/23 4:18 PM [facility] [address] [place], [place], [address] Phone: [phone]   Fax:  [phone]

## 2023-08-27 ENCOUNTER — Encounter: Payer: Self-pay | Admitting: Registered Nurse

## 2023-08-27 ENCOUNTER — Other Ambulatory Visit: Payer: Self-pay | Admitting: Nurse Practitioner

## 2023-08-27 DIAGNOSIS — R928 Other abnormal and inconclusive findings on diagnostic imaging of breast: Secondary | ICD-10-CM

## 2023-08-28 ENCOUNTER — Ambulatory Visit (HOSPITAL_BASED_OUTPATIENT_CLINIC_OR_DEPARTMENT_OTHER): Admitting: Physical Therapy

## 2023-08-28 ENCOUNTER — Encounter (HOSPITAL_BASED_OUTPATIENT_CLINIC_OR_DEPARTMENT_OTHER): Payer: Self-pay | Admitting: Physical Therapy

## 2023-08-28 DIAGNOSIS — M25572 Pain in left ankle and joints of left foot: Secondary | ICD-10-CM

## 2023-08-28 DIAGNOSIS — M6281 Muscle weakness (generalized): Secondary | ICD-10-CM

## 2023-08-28 DIAGNOSIS — R262 Difficulty in walking, not elsewhere classified: Secondary | ICD-10-CM

## 2023-08-28 NOTE — Therapy (Signed)
 OUTPATIENT PHYSICAL THERAPY LOWER EXTREMITY TREATMENT Progress Note Reporting Period 06/26/23 to 08/28/23  See note below for Objective Data and Assessment of Progress/Goals.       Patient Name: Kelsey Wilson MRN: [id number] DOB:13-Apr-1961, 63 y.o., female Today's Date: 08/28/2023  END OF SESSION:  PT End of Session - 08/28/23 1659     Visit Number 10    Number of Visits 16    Date for PT Re-Evaluation 09/20/23    Authorization Type [employer]    Progress Note Due on Visit 10    PT Start Time 1700    PT Stop Time 1740    PT Time Calculation (min) 40 min    Activity Tolerance Patient tolerated treatment well    Behavior During Therapy WFL for tasks assessed/performed                 Past Medical History:  Diagnosis Date   Anemia    Anxiety    Arthritis    Breast cancer (HCC) 2013   T3N1 invasive ductal carcinoma left breast.Takes Arimidex daily   Bursitis    Carpal tunnel syndrome    Chronic back pain    stenosis   Constipation    takes Colace daily   Depression    takes Benzotropine daily   Diverticulitis of colon    Dyspnea    daily when walking for over 1 yr.   Fibromyalgia 08/2012   GERD (gastroesophageal reflux disease)    takes Dexilant daily   Hemorrhoid    History of blood transfusion    no abnormal reaction noted   History of colon polyps    benign   History of shingles    Joint pain    Joint swelling    Morbid obesity (HCC)    Night muscle spasms    takes Flexeril nightly as needed   Nocturia    OSA (obstructive sleep apnea)    OSA on CPAP    Peripheral edema    takes Furosemide.Just started 01/18/16   Peripheral neuropathy    takes Lyrica daily   Personal history of chemotherapy    2013   Personal history of radiation therapy    2013   Pneumonia    hx of-2015   Pseudoarthrosis of lumbar spine    Seasonal allergies    takes Singulair nightly   SOB (shortness of breath) on exertion    rarely with exertion   Splenorenal shunt  malfunction (HCC)    stable splenorenal shunt 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 Midge Aver)   Type II diabetes mellitus (HCC)    Past Surgical History:  Procedure Laterality Date   ABDOMINAL HYSTERECTOMY     still has ovaries   APPENDECTOMY     AXILLARY LYMPH NODE DISSECTION  11/28/2011   Procedure: AXILLARY LYMPH NODE DISSECTION;  Surgeon: Mariella Saa, MD;  Location: [facility] OR;  Service: General;  Laterality: Left;   BREAST LUMPECTOMY Left 11/28/2011   Malignant   BREAST SURGERY Left 2013   CARPAL TUNNEL RELEASE     Bilateral   CESAREAN SECTION     pt. has had 3   CHOLECYSTECTOMY     COLONOSCOPY     COLONOSCOPY WITH PROPOFOL N/A 01/24/2021   Procedure: COLONOSCOPY WITH PROPOFOL;  Surgeon: Sherrilyn Rist, MD;  Location: [facility];  Service: Gastroenterology;  Laterality: N/A;   ESOPHAGOGASTRODUODENOSCOPY (EGD) WITH PROPOFOL N/A 01/24/2021   Procedure: ESOPHAGOGASTRODUODENOSCOPY (EGD)  WITH PROPOFOL;  Surgeon: Sherrilyn Rist, MD;  Location: [facility];  Service: Gastroenterology;  Laterality: N/A;   EYE SURGERY Bilateral    cataract removal   KNEE SURGERY     Left Knee   LAMINECTOMY WITH POSTERIOR LATERAL ARTHRODESIS LEVEL 1 N/A 11/29/2017   Procedure: Posterior Lateral Fusion - Lumbar Four-Lumbar Five, removal and replacement of hardware, Laminectomy - Lumbar Four-Lumbar Five;  Surgeon: Tia Alert, MD;  Location: [facility] OR;  Service: Neurosurgery;  Laterality: N/A;   LAMINECTOMY WITH POSTERIOR LATERAL ARTHRODESIS LEVEL 2 N/A 06/07/2017   Procedure: Posterior Lateral Fusion Lumbar Three-Four and Transforaminal Interbody Fusion Lumbar Four-Five with Segmental  Pedicle Screw Fixation;  Surgeon: Tia Alert, MD;  Location: [facility] OR;  Service: Neurosurgery;  Laterality: N/A;  Posterior Lateral Fusion Lumbar Three-Four and Transforaminal Interbody Fusion Lumbar Four-Five with Segmental  Pedicle Screw Fixation    LUMBAR FUSION   06/07/2017   POST   LUMBAR LAMINECTOMY/DECOMPRESSION MICRODISCECTOMY Bilateral 01/27/2016   Procedure: Laminectomy and Foraminotomy - Lumbar four -Lumbar five - bilateral- on-lay noninstrumented fusion;  Surgeon: Tia Alert, MD;  Location: [facility];  Service: Neurosurgery;  Laterality: Bilateral;   MULTIPLE EXTRACTIONS WITH ALVEOLOPLASTY N/A 05/11/2016   Procedure: EXTRACTION OF TEETH EIGHTEEN, TWENTY AND TWENTY- NINE;  REMOVAL OF MANDIBULAR TORUS AND EXOSTOSIS;  Surgeon: Ocie Doyne, DDS;  Location: [facility];  Service: Oral Surgery;  Laterality: N/A;   POLYPECTOMY  01/24/2021   Procedure: POLYPECTOMY;  Surgeon: Sherrilyn Rist, MD;  Location: [facility];  Service: Gastroenterology;;   PORTACATH PLACEMENT  06/18/2011   Procedure: INSERTION PORT-A-CATH;  Surgeon: Mariella Saa, MD;  Location: [facility];  Service: General;  Laterality: Right;  right subclavian   removal portacath  2014   spur     Apex spur on both big toes   TOE SURGERY Bilateral    TONSILLECTOMY     TOTAL KNEE ARTHROPLASTY Left 09/13/2014   TOTAL KNEE ARTHROPLASTY Left 09/13/2014   Procedure: LEFT TOTAL KNEE ARTHROPLASTY;  Surgeon: Samson Frederic, MD;  Location: [facility];  Service: Orthopedics;  Laterality: Left;   TOTAL KNEE ARTHROPLASTY Right 03/10/2015   Procedure: RIGHT TOTAL KNEE ARTHROPLASTY;  Surgeon: Samson Frederic, MD;  Location: [facility];  Service: Orthopedics;  Laterality: Right;   Patient Active Problem List   Diagnosis Date Noted   Drug-induced polyneuropathy (HCC) 04/08/2023   Morbid obesity (HCC)    Type II diabetes mellitus (HCC)    GERD (gastroesophageal reflux disease) 06/21/2019   Insomnia 03/03/2019   Ankle arthritis 12/12/2018   Muscle weakness 12/12/2018   Obstructive sleep apnea 10/23/2018   Asthmatic bronchitis 10/23/2018   Cellulitis of internal cheek, left 01/23/2018   Lung nodule 09/15/2017   Morbid obesity with BMI of 45.0-49.9, adult (HCC) 09/05/2017   Arthritis of  carpometacarpal ([facility]) joints of both thumbs 03/14/2017   Spinal stenosis, lumbar region, with neurogenic claudication 03/07/2017   Vertigo 10/12/2016   Peripheral positional vertigo 08/28/2016   Abnormal auditory perception of both ears 07/27/2016   Neuropathic pain 06/25/2016   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   S/P lumbar spinal fusion 01/27/2016   H/O therapeutic radiation 09/21/2015   Personal history of breast cancer 09/21/2015   Pes planus of both feet 08/04/2015   Primary osteoarthritis of right knee 03/10/2015   Osteoarthritis of right knee 02/04/2015   Primary osteoarthritis of left knee 09/13/2014   Hot flashes 01/19/2014   Abdominal pain 01/19/2014   Malignant neoplasm of  upper-outer quadrant of left breast in female, estrogen receptor positive (HCC) 03/19/2013   Lumbosacral spondylosis without myelopathy 12/30/2012   Fibromyalgia syndrome 08/15/2012   Peripheral neuropathy, toxic 08/15/2012   Lymphedema of arm - left 04/30/2012    PCP: Noreene Filbert NP  REFERRING PROVIDER: Vivi Barrack, DPM   REFERRING DIAG:  [phone] (ICD-10-CM) - Capsulitis of ankle, left  M77.52 (ICD-10-CM) - Tendonitis of ankle, left    THERAPY DIAG:  Pain in left ankle and joints of left foot  Difficulty in walking, not elsewhere classified  Muscle weakness (generalized)  Rationale for Evaluation and Treatment: Rehabilitation  ONSET DATE: >5  SUBJECTIVE:   SUBJECTIVE STATEMENT: "I feel like this takes off the edge"      POOL ACCESS:  Initial subjective Had br cancer 12 years ago hand and feet hurt from the chemo. Have peripheral neuropathies due to DM and chemo.  Also dx with fibromyalgia.  Trying water PT to avoid ankle surgery.  Both ankle feet involvement. Can stand for maybe 15 minutes before needing to sit.  PERTINENT HISTORY: Chronic ankle pain; Lumbar lam and fusion 2023 Spinal stenosis TKR 2016 PAIN:  Are you having pain? Yes: NPRS scale: current 7/10 Pain  location: L heel/foot and dorsum right Pain description:  achey  Aggravating factors: sitting in hard chairs Relieving factors: sidelying  PRECAUTIONS: Fall  RED FLAGS: None   WEIGHT BEARING RESTRICTIONS: No  FALLS:  Has patient fallen in last 6 months? Yes. Number of falls 1 on 2/19  LIVING ENVIRONMENT: Lives with: lives with their family Lives in: House/apartment Stairs: 2-3 to enter home   OCCUPATION: disabled  PLOF: Independent  PATIENT GOALS: decrease ankle pain, put off ankle surgery  NEXT MD VISIT:   OBJECTIVE:  Note: Objective measures were completed at Evaluation unless otherwise noted.  DIAGNOSTIC FINDINGS: N/a  PATIENT SURVEYS:  FOTO Primary measure 44% with goal of 51%  COGNITION: Overall cognitive status: Within functional limits for tasks assessed     SENSATION: WFL  EDEMA:  Mild edema about bilat ankles    PALPATION: mild global tenderness around the ankle on both medial, lateral aspects, Achilles tendon bilaterally with minimal edema.   LOWER EXTREMITY ROM:  Active ROM Right eval Left eval  Hip flexion    Hip extension    Hip abduction    Hip adduction    Hip internal rotation    Hip external rotation    Knee flexion    Knee extension    Ankle dorsiflexion 5 2  Ankle plantarflexion 16 8  Ankle inversion    Ankle eversion     (Blank rows = not tested)  LOWER EXTREMITY MMT:  MMT Right eval Left eval R / L 3/5 R / L 3/26  Hip flexion 4 4    Hip extension      Hip abduction      Hip adduction      Hip internal rotation      Hip external rotation      Knee flexion 4 4    Knee extension 4 4    Ankle dorsiflexion 3+ P! 3P! 4 / 4- 4 /4  Ankle plantarflexion 3P! 3P! 4- /4- 4 /4  Ankle inversion Not tolerated Not tolerated 3/5 4-/5  Ankle eversion Not tolerated Not tolerated 3/5 4-/5   (Blank rows = not tested)  LOWER EXTREMITY SPECIAL TESTS:  Ankle special tests: Anterior drawer test: positive  and Thompson's test:  positive prone knee bent squeeze calf  for sudden pf pos if pain  FUNCTIONAL TESTS:  Timed up and go (TUG): 17.84  GAIT: Distance walked: 400 ft 2 rest periods Assistive device utilized: None Level of assistance: Modified independence Comments: Short quick steps, minimal heel strike (like walking on egg shells)                                                                                                                                TREATMENT   Pt seen for aquatic therapy today.  Treatment took place in water 3.5-4.75 ft in depth at the [facility] pool. Temp of water was 91.  Pt entered/exited the pool via stairs using step-to pattern with hand rail.  *Unsupported: walking forward/backward with row motion  - squatted rest period *Ue support wall 4.2 ft: toe raises, heel raises; high knee marching  - squatted rest periods * Toe walking forward/back x 2 widths ea 4.0 ft ue support barbell *seated on lift: cycling; hip add/abd and hip flex/ext; ankle ROM all directions all planes   Pt requires the buoyancy and hydrostatic pressure of water for support, and to offload joints by unweighting joint load by at least 50 % in navel deep water and by at least 75-80% in chest to neck deep water.  Viscosity of the water is needed for resistance of strengthening. Water current perturbations provides challenge to standing balance requiring increased core activation.     PATIENT EDUCATION:  Education details: aquatic therapy exercise progressions/ modifications  Person educated: Patient Education method: Explanation Education comprehension: verbalized understanding  HOME EXERCISE PROGRAM: TBA  ASSESSMENT:  CLINICAL IMPRESSION: PN: Pt reports maybe slight improvement in ability to sit on chair and stand to cook but continues to be limited by pain, numbness and P&N in le through feet. She has had a strength improvement in bilateral ankles.  She did have a fall which has slowed  her progress as she was put back into a shoe boot and brace bilateral ankles. Pt reports the aquatic therapy has helped " take the edge off" but hasn't seen a great improvement.  PmHx does include chronic conditions which are likely responsible for slow progress (peripheral neuropathies due to chemotherapy, fibromyalgia, and morbid obesity).  Plan to create and instruct on final aquatic HEP in next 2 sessions then transition on land for 2 session for instruction on HEP then DC.  She will be accessing pool at [facility] upon dc.       OBJECTIVE IMPAIRMENTS: Abnormal gait, decreased activity tolerance, decreased endurance, decreased mobility, difficulty walking, increased muscle spasms, impaired sensation, obesity, and pain.   ACTIVITY LIMITATIONS: standing, squatting, stairs, and locomotion level  PARTICIPATION LIMITATIONS: meal prep, cleaning, laundry, shopping, community activity, and yard work  PERSONAL FACTORS: Past/current experiences, Time since onset of injury/illness/exacerbation, and 3+ comorbidities: see PmHx  are also affecting patient's functional outcome.   REHAB POTENTIAL: Good  CLINICAL DECISION MAKING: Evolving/moderate complexity  EVALUATION COMPLEXITY: Moderate  GOALS: Goals reviewed with patient? Yes  SHORT TERM GOALS/LONG TERM GOALS: Target date: 09/20/23 Pt will tolerate full aquatic sessions consistently without increase in pain and with improving function to demonstrate good toleration and effectiveness of intervention.  Baseline: Goal status: MET - 07/16/23  2.  Pt will tolerate sitting in hard chair (kitchen) x 30 minutes without increased pain Baseline: 30 with increase in pain Goal status: In process (no change) 08/07/23; 08/28/22  3.  Pt will report tolerating standing > 20 minutes to cook or get through grocery store Baseline: 15 max Goal status: In progress 08/07/23  4.  Pt will improve ankle ROM by 50% Baseline: see chart Goal status: In progress 08/07/23  5.   Pt will be indep with final HEP's (land and aquatic as appropriate) for continued management of condition Baseline:  Goal status: In progress 08/07/23  6.  Pt will report decrease in worst ankle/foot pain 3 NPRS for improved toleration to activity Baseline: 10/10 Goal status: In progress 08/07/23     PLAN:  PT FREQUENCY: 1-2x/week  PT DURATION: 6 weeks aquatic and land  PLANNED INTERVENTIONS: 97164- PT Re-evaluation, 97110-Therapeutic exercises, 97530- Therapeutic activity, 97112- Neuromuscular re-education, 97535- Self Care, 16109- Manual therapy, [phone]- Gait training, [phone]- Orthotic Fit/training, [phone]- Aquatic Therapy, 97014- Electrical stimulation (unattended), [phone]- Ionotophoresis 4mg /ml Dexamethasone, Patient/Family education, Balance training, Stair training, Taping, Dry Needling, Joint mobilization, DME instructions, Cryotherapy, and Moist heat  PLAN FOR NEXT SESSION: aquatic: ankle ROM and strengthening. Balance retraining. Pain management  Rushie Chestnut) Davontae Prusinski MPT 08/28/23 5:16 PM [facility] [address] [place], [place], [address] Phone: [phone]   Fax:  [phone]

## 2023-08-29 ENCOUNTER — Encounter: Attending: Registered Nurse | Admitting: Registered Nurse

## 2023-08-29 ENCOUNTER — Encounter: Payer: Self-pay | Admitting: Registered Nurse

## 2023-08-29 VITALS — BP 105/74 | HR 96 | Ht 62.0 in | Wt 271.4 lb

## 2023-08-29 DIAGNOSIS — M79642 Pain in left hand: Secondary | ICD-10-CM | POA: Insufficient documentation

## 2023-08-29 DIAGNOSIS — Z981 Arthrodesis status: Secondary | ICD-10-CM | POA: Diagnosis not present

## 2023-08-29 DIAGNOSIS — G629 Polyneuropathy, unspecified: Secondary | ICD-10-CM | POA: Diagnosis not present

## 2023-08-29 DIAGNOSIS — M546 Pain in thoracic spine: Secondary | ICD-10-CM | POA: Insufficient documentation

## 2023-08-29 DIAGNOSIS — M170 Bilateral primary osteoarthritis of knee: Secondary | ICD-10-CM | POA: Insufficient documentation

## 2023-08-29 DIAGNOSIS — G8929 Other chronic pain: Secondary | ICD-10-CM | POA: Diagnosis not present

## 2023-08-29 DIAGNOSIS — G5793 Unspecified mononeuropathy of bilateral lower limbs: Secondary | ICD-10-CM

## 2023-08-29 DIAGNOSIS — Z79891 Long term (current) use of opiate analgesic: Secondary | ICD-10-CM | POA: Diagnosis not present

## 2023-08-29 DIAGNOSIS — W19XXXD Unspecified fall, subsequent encounter: Secondary | ICD-10-CM

## 2023-08-29 DIAGNOSIS — M533 Sacrococcygeal disorders, not elsewhere classified: Secondary | ICD-10-CM | POA: Insufficient documentation

## 2023-08-29 DIAGNOSIS — Z5181 Encounter for therapeutic drug level monitoring: Secondary | ICD-10-CM | POA: Diagnosis not present

## 2023-08-29 DIAGNOSIS — M797 Fibromyalgia: Secondary | ICD-10-CM

## 2023-08-29 DIAGNOSIS — M62838 Other muscle spasm: Secondary | ICD-10-CM | POA: Insufficient documentation

## 2023-08-29 DIAGNOSIS — M7062 Trochanteric bursitis, left hip: Secondary | ICD-10-CM | POA: Insufficient documentation

## 2023-08-29 DIAGNOSIS — M25572 Pain in left ankle and joints of left foot: Secondary | ICD-10-CM | POA: Insufficient documentation

## 2023-08-29 DIAGNOSIS — M25571 Pain in right ankle and joints of right foot: Secondary | ICD-10-CM | POA: Diagnosis not present

## 2023-08-29 DIAGNOSIS — M5416 Radiculopathy, lumbar region: Secondary | ICD-10-CM | POA: Diagnosis not present

## 2023-08-29 DIAGNOSIS — M1711 Unilateral primary osteoarthritis, right knee: Secondary | ICD-10-CM | POA: Diagnosis not present

## 2023-08-29 DIAGNOSIS — M1712 Unilateral primary osteoarthritis, left knee: Secondary | ICD-10-CM | POA: Insufficient documentation

## 2023-08-29 DIAGNOSIS — M7061 Trochanteric bursitis, right hip: Secondary | ICD-10-CM | POA: Diagnosis not present

## 2023-08-29 DIAGNOSIS — G894 Chronic pain syndrome: Secondary | ICD-10-CM

## 2023-08-29 DIAGNOSIS — M47817 Spondylosis without myelopathy or radiculopathy, lumbosacral region: Secondary | ICD-10-CM | POA: Diagnosis not present

## 2023-08-29 DIAGNOSIS — M79641 Pain in right hand: Secondary | ICD-10-CM | POA: Insufficient documentation

## 2023-08-29 DIAGNOSIS — R262 Difficulty in walking, not elsewhere classified: Secondary | ICD-10-CM | POA: Insufficient documentation

## 2023-08-29 DIAGNOSIS — Z79899 Other long term (current) drug therapy: Secondary | ICD-10-CM | POA: Diagnosis not present

## 2023-08-29 DIAGNOSIS — W109XXA Fall (on) (from) unspecified stairs and steps, initial encounter: Secondary | ICD-10-CM | POA: Diagnosis not present

## 2023-08-29 DIAGNOSIS — M542 Cervicalgia: Secondary | ICD-10-CM | POA: Diagnosis not present

## 2023-08-29 NOTE — Progress Notes (Signed)
 Subjective:    Patient ID: Kelsey Wilson, female    DOB: 1960-12-01, 63 y.o.   MRN: [id number]  HPI: Kelsey Wilson is a 63 y.o. female who returns for follow up appointment for chronic pain and medication refill. She states her pain is located in her mid- lower back, bilateral knee and bilateral ankle pain. Also reports bilateral ankle pain being followed by podiatry. She rates her pain 6. Her current exercise regime is walking and going to Aquatic Therapy twice a week.   [name] reports two weeks ago she was walking up her apartment steps  and lost her footing and fell forward. She was able to pick herself up, she states she went to her podiatrist the next day. Educated on falls prevention, she verbalizes understanding.   [name] Morphine equivalent is 60.00 MME., last fill date 04/05/2023   UDS ordered today    Pain Inventory Average Pain 7 States 8 Pain Right Now 6 My pain is aching  In the last 24 hours, has pain interfered with the following? General activity 5 Relation with others 8 Enjoyment of life 10 What TIME of day is your pain at its worst? morning , daytime, evening, and night Sleep (in general) Fair  Pain is worse with:  States it doesn't matter what she is doing. Pain improves with: therapy/exercise and medication Relief from Meds: 1  Family History  Problem Relation Age of Onset   Hypertension Mother    Diabetes Mother    Hypertension Father    Diabetes Father    Hypertension Sister    Hyperlipidemia Brother    Breast cancer Paternal Aunt    Cancer Paternal Grandmother        unknown   Colon cancer Neg Hx    Parkinson's disease Neg Hx    Social History   Socioeconomic History   Marital status: Single    Spouse name: Not on file   Number of children: 3   Years of education: Not on file   Highest education level: Not on file  Occupational History   Not on file  Tobacco Use   Smoking status: Never   Smokeless tobacco: Never  Vaping Use    Vaping status: Never Used  Substance and Sexual Activity   Alcohol use: No    Alcohol/week: 0.0 standard drinks of alcohol   Drug use: No   Sexual activity: Never    Birth control/protection: Post-menopausal, Surgical  Other Topics Concern   Not on file  Social History Narrative   Not on file   Social Drivers of Health   Financial Resource Strain: Not on file  Food Insecurity: Not on file  Transportation Needs: Not on file  Physical Activity: Not on file  Stress: Not on file  Social Connections: Unknown (10/16/2021)   [facility]   Social Network    Social Network: Not on file   Past Surgical History:  Procedure Laterality Date   ABDOMINAL HYSTERECTOMY     still has ovaries   APPENDECTOMY     AXILLARY LYMPH NODE DISSECTION  11/28/2011   Procedure: AXILLARY LYMPH NODE DISSECTION;  Surgeon: Mariella Saa, MD;  Location: [facility] OR;  Service: General;  Laterality: Left;   BREAST LUMPECTOMY Left 11/28/2011   Malignant   BREAST SURGERY Left 2013   CARPAL TUNNEL RELEASE     Bilateral   CESAREAN SECTION     pt. has had 3   CHOLECYSTECTOMY  COLONOSCOPY     COLONOSCOPY WITH PROPOFOL N/A 01/24/2021   Procedure: COLONOSCOPY WITH PROPOFOL;  Surgeon: Sherrilyn Rist, MD;  Location: [facility];  Service: Gastroenterology;  Laterality: N/A;   ESOPHAGOGASTRODUODENOSCOPY (EGD) WITH PROPOFOL N/A 01/24/2021   Procedure: ESOPHAGOGASTRODUODENOSCOPY (EGD) WITH PROPOFOL;  Surgeon: Sherrilyn Rist, MD;  Location: [facility];  Service: Gastroenterology;  Laterality: N/A;   EYE SURGERY Bilateral    cataract removal   KNEE SURGERY     Left Knee   LAMINECTOMY WITH POSTERIOR LATERAL ARTHRODESIS LEVEL 1 N/A 11/29/2017   Procedure: Posterior Lateral Fusion - Lumbar Four-Lumbar Five, removal and replacement of hardware, Laminectomy - Lumbar Four-Lumbar Five;  Surgeon: Tia Alert, MD;  Location: [facility] OR;  Service: Neurosurgery;  Laterality: N/A;    LAMINECTOMY WITH POSTERIOR LATERAL ARTHRODESIS LEVEL 2 N/A 06/07/2017   Procedure: Posterior Lateral Fusion Lumbar Three-Four and Transforaminal Interbody Fusion Lumbar Four-Five with Segmental  Pedicle Screw Fixation;  Surgeon: Tia Alert, MD;  Location: [facility] OR;  Service: Neurosurgery;  Laterality: N/A;  Posterior Lateral Fusion Lumbar Three-Four and Transforaminal Interbody Fusion Lumbar Four-Five with Segmental  Pedicle Screw Fixation    LUMBAR FUSION  06/07/2017   POST   LUMBAR LAMINECTOMY/DECOMPRESSION MICRODISCECTOMY Bilateral 01/27/2016   Procedure: Laminectomy and Foraminotomy - Lumbar four -Lumbar five - bilateral- on-lay noninstrumented fusion;  Surgeon: Tia Alert, MD;  Location: [facility];  Service: Neurosurgery;  Laterality: Bilateral;   MULTIPLE EXTRACTIONS WITH ALVEOLOPLASTY N/A 05/11/2016   Procedure: EXTRACTION OF TEETH EIGHTEEN, TWENTY AND TWENTY- NINE;  REMOVAL OF MANDIBULAR TORUS AND EXOSTOSIS;  Surgeon: Ocie Doyne, DDS;  Location: [facility] OR;  Service: Oral Surgery;  Laterality: N/A;   POLYPECTOMY  01/24/2021   Procedure: POLYPECTOMY;  Surgeon: Sherrilyn Rist, MD;  Location: [facility];  Service: Gastroenterology;;   PORTACATH PLACEMENT  06/18/2011   Procedure: INSERTION PORT-A-CATH;  Surgeon: Mariella Saa, MD;  Location: [facility];  Service: General;  Laterality: Right;  right subclavian   removal portacath  2014   spur     Apex spur on both big toes   TOE SURGERY Bilateral    TONSILLECTOMY     TOTAL KNEE ARTHROPLASTY Left 09/13/2014   TOTAL KNEE ARTHROPLASTY Left 09/13/2014   Procedure: LEFT TOTAL KNEE ARTHROPLASTY;  Surgeon: Samson Frederic, MD;  Location: [facility] OR;  Service: Orthopedics;  Laterality: Left;   TOTAL KNEE ARTHROPLASTY Right 03/10/2015   Procedure: RIGHT TOTAL KNEE ARTHROPLASTY;  Surgeon: Samson Frederic, MD;  Location: [facility];  Service: Orthopedics;  Laterality: Right;   Past Surgical History:  Procedure Laterality Date   ABDOMINAL  HYSTERECTOMY     still has ovaries   APPENDECTOMY     AXILLARY LYMPH NODE DISSECTION  11/28/2011   Procedure: AXILLARY LYMPH NODE DISSECTION;  Surgeon: Mariella Saa, MD;  [facility]: [facility];  Service: General;  Laterality: Left;   BREAST LUMPECTOMY Left 11/28/2011   Malignant   BREAST SURGERY Left 2013   CARPAL TUNNEL RELEASE     Bilateral   CESAREAN SECTION     pt. has had 3   CHOLECYSTECTOMY     COLONOSCOPY     COLONOSCOPY WITH PROPOFOL N/A 01/24/2021   Procedure: COLONOSCOPY WITH PROPOFOL;  Surgeon: Sherrilyn Rist, MD;  Location: [facility];  Service: Gastroenterology;  Laterality: N/A;   ESOPHAGOGASTRODUODENOSCOPY (EGD) WITH PROPOFOL N/A 01/24/2021   Procedure: ESOPHAGOGASTRODUODENOSCOPY (EGD) WITH PROPOFOL;  Surgeon: Sherrilyn Rist, MD;  Location: [facility];  Service: Gastroenterology;  Laterality: N/A;   EYE SURGERY Bilateral    cataract removal   KNEE SURGERY     Left Knee   LAMINECTOMY WITH POSTERIOR LATERAL ARTHRODESIS LEVEL 1 N/A 11/29/2017   Procedure: Posterior Lateral Fusion - Lumbar Four-Lumbar Five, removal and replacement of hardware, Laminectomy - Lumbar Four-Lumbar Five;  Surgeon: Tia Alert, MD;  Location: [facility] OR;  Service: Neurosurgery;  Laterality: N/A;   LAMINECTOMY WITH POSTERIOR LATERAL ARTHRODESIS LEVEL 2 N/A 06/07/2017   Procedure: Posterior Lateral Fusion Lumbar Three-Four and Transforaminal Interbody Fusion Lumbar Four-Five with Segmental  Pedicle Screw Fixation;  Surgeon: Tia Alert, MD;  Location: [facility] OR;  Service: Neurosurgery;  Laterality: N/A;  Posterior Lateral Fusion Lumbar Three-Four and Transforaminal Interbody Fusion Lumbar Four-Five with Segmental  Pedicle Screw Fixation    LUMBAR FUSION  06/07/2017   POST   LUMBAR LAMINECTOMY/DECOMPRESSION MICRODISCECTOMY Bilateral 01/27/2016   Procedure: Laminectomy and Foraminotomy - Lumbar four -Lumbar five - bilateral- on-lay noninstrumented fusion;  Surgeon: Tia Alert, MD;  [facility]: [facility];  Service: Neurosurgery;  Laterality: Bilateral;   MULTIPLE EXTRACTIONS WITH ALVEOLOPLASTY N/A 05/11/2016   Procedure: EXTRACTION OF TEETH EIGHTEEN, TWENTY AND TWENTY- NINE;  REMOVAL OF MANDIBULAR TORUS AND EXOSTOSIS;  Surgeon: Ocie Doyne, DDS;  Location: [facility] OR;  Service: Oral Surgery;  Laterality: N/A;   POLYPECTOMY  01/24/2021   Procedure: POLYPECTOMY;  Surgeon: Sherrilyn Rist, MD;  Location: [facility];  Service: Gastroenterology;;   PORTACATH PLACEMENT  06/18/2011   Procedure: INSERTION PORT-A-CATH;  Surgeon: Mariella Saa, MD;  Location:  SURGERY CENTER;  Service: General;  Laterality: Right;  right subclavian   removal portacath  2014   spur     Apex spur on both big toes   TOE SURGERY Bilateral    TONSILLECTOMY     TOTAL KNEE ARTHROPLASTY Left 09/13/2014   TOTAL KNEE ARTHROPLASTY Left 09/13/2014   Procedure: LEFT TOTAL KNEE ARTHROPLASTY;  Surgeon: Samson Frederic, MD;  Location: [facility] OR;  Service: Orthopedics;  Laterality: Left;   TOTAL KNEE ARTHROPLASTY Right 03/10/2015   Procedure: RIGHT TOTAL KNEE ARTHROPLASTY;  Surgeon: Samson Frederic, MD;  Location: [facility];  Service: Orthopedics;  Laterality: Right;   Past Medical History:  Diagnosis Date   Anemia    Anxiety    Arthritis    Breast cancer (HCC) 2013   T3N1 invasive ductal carcinoma left breast.Takes Arimidex daily   Bursitis    Carpal tunnel syndrome    Chronic back pain    stenosis   Constipation    takes Colace daily   Depression    takes Benzotropine daily   Diverticulitis of colon    Dyspnea    daily when walking for over 1 yr.   Fibromyalgia 08/2012   GERD (gastroesophageal reflux disease)    takes Dexilant daily   Hemorrhoid    History of blood transfusion    no abnormal reaction noted   History of colon polyps    benign   History of shingles    Joint pain    Joint swelling    Morbid obesity (HCC)    Night muscle spasms    takes Flexeril nightly as needed   Nocturia    OSA  (obstructive sleep apnea)    OSA on CPAP    Peripheral edema    takes Furosemide.Just started 01/18/16   Peripheral neuropathy    takes Lyrica daily   Personal history of chemotherapy    2013  Personal history of radiation therapy    2013   Pneumonia    hx of-2015   Pseudoarthrosis of lumbar spine    Seasonal allergies    takes Singulair nightly   SOB (shortness of breath) on exertion    rarely with exertion   Splenorenal shunt malfunction (HCC)    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 Midge Aver)   Type II diabetes mellitus (HCC)    There were no vitals taken for this visit.  Opioid Risk Score:   Fall Risk Score:  `1  Depression screen [facility] 2/9     02/25/2023    9:53 AM 08/08/2022   10:48 AM 08/04/2018   10:45 AM 06/06/2018    9:30 AM 04/04/2018   10:03 AM 01/13/2018   10:08 AM 12/13/2017    9:46 AM  Depression screen PHQ 2/9  Decreased Interest 0 0 0 0 0 3 3  Down, Depressed, Hopeless 0 0 0 0 0 3 3  PHQ - 2 Score 0 0 0 0 0 6 6    Review of Systems     Objective:   Physical Exam Vitals and nursing note reviewed.  Constitutional:      Appearance: Normal appearance. She is obese.  Cardiovascular:     Rate and Rhythm: Normal rate and regular rhythm.     Pulses: Normal pulses.     Heart sounds: Normal heart sounds.  Pulmonary:     Effort: Pulmonary effort is normal.     Breath sounds: Normal breath sounds.  Musculoskeletal:     Comments: Normal Muscle Bulk and Muscle Testing Reveals:  Upper Extremities: Full ROM and Muscle Strength 5/5  Lumbar Paraspinal Tenderness: L-3-L-5 Bilateral Greater Trochanter Tenderness Lower Extremities: Full ROM and Muscle Strength 5/5 Arises from Table Slowly Antalgic  Gait     Skin:    General: Skin is warm and dry.  Neurological:     Mental Status: She is alert and oriented to person, place, and time.  Psychiatric:        Mood and Affect: Mood normal.         Behavior: Behavior normal.         Assessment & Plan:  1. Chronic Sacroiliac Joint Pain: S/P Bilateral Sacroilliac Injection with Dr. Wynn Banker on 06/23/2019. With good relief noted. 08/29/2023. 2.. Lumbar Postlaminectomy/ S/P Lumbar Fusion:Spinal Stenosis:S/P Posterior Lateral Fusion L-4-L-5 removal and replacement of hardware, Laminectomy L4-L5 by Dr. Yetta Barre on 05/22/2018. Continue HEP as Tolerated and Continue current medication regimen. 08/29/2023   Continue:  Tramadol (50 mg) Take one- two tablets three times a day as needed for pain #180   We will continue the opioid monitoring program, this consists of regular clinic visits, examinations, urine drug screen, pill counts as well as use of [place] Controlled Substance Reporting system. A 12 month History has been reviewed on the [place] Controlled Substance Reporting System on 08/29/2023. 3. Lumbar Radiculitis: No complaints today. Continue current medication regime with Gabapentin. 08/29/2023 4. Fibromyalgia: Continue HEP as Tolerated. Continue Current Medication Regimen with Gabapentin. 08/29/2023 5. Bilateral Greater Trochanter Bursitis: Continue to Alternate Ice and Heat Therapy.Continue to monitor.  03/272025. 6. Muscle Spasm: Continue: Flexeril. Continue to Monitor. 08/29/2023. 7. Chronic Pain Syndrome: Continue Current medication regimen and HEP as Tolerated. Continue to Monitor. 08/29/2023.  8. Bilateral OA of Bilateral Knees: Orthopedist following.  Continue to Monitor. 08/29/2023 9. Bilateral Hand Pain: No complaints today. Continue to Monitor/ ?  OA: Continue to Monitor. 08/29/2023 10. Cervicalgia: No complaints Today. Continue to alternate Heat/Ice Therapy. Continue HEP as Tolerated. 08/29/2023. 11. Chronic Bilateral Shoulders: No complaints today. Continue to alternate Ice/ Heat therapy and HEP as Tolerated. 08/29/2023.  12. Bilateral Ankle Pain: Podiatry Following: Continue to Monitor. 08/29/2023   13.  Bilateral Fingers with Neuropathic Pain: Contin Gabapentin. Educated on following diabetic diet and tight blood sugar control, she verbalizes understanding. 08/29/2023 14. Fall, subsequent encounter: Educated on [employer], she verbalizes understanding. Continue to Monitor.    F/U in 6 months

## 2023-08-29 NOTE — Addendum Note (Signed)
 Addended by: Becky Sax on: 08/29/2023 03:19 PM   Modules accepted: Orders

## 2023-09-01 LAB — DRUG TOX MONITOR 1 W/CONF, ORAL FLD
Amphetamines: NEGATIVE ng/mL (ref <10)
Barbiturates: NEGATIVE ng/mL (ref <10)
Benzodiazepines: NEGATIVE ng/mL (ref <0.50)
Buprenorphine: NEGATIVE ng/mL (ref <0.10)
Cocaine: NEGATIVE ng/mL (ref <5.0)
Fentanyl: NEGATIVE ng/mL (ref <0.10)
Heroin Metabolite: NEGATIVE ng/mL (ref <1.0)
MARIJUANA: NEGATIVE ng/mL (ref <2.5)
MDMA: NEGATIVE ng/mL (ref <10)
Meprobamate: NEGATIVE ng/mL (ref <2.5)
Methadone: NEGATIVE ng/mL (ref <5.0)
Nicotine Metabolite: NEGATIVE ng/mL (ref <5.0)
Opiates: NEGATIVE ng/mL (ref <2.5)
Phencyclidine: NEGATIVE ng/mL (ref <10)
Tapentadol: NEGATIVE ng/mL (ref <5.0)
Tramadol: 137.1 ng/mL — ABNORMAL HIGH (ref <5.0)
Tramadol: POSITIVE ng/mL — AB (ref <5.0)
Zolpidem: NEGATIVE ng/mL (ref <5.0)

## 2023-09-01 LAB — DRUG TOX ALC METAB W/CON, ORAL FLD: Alcohol Metabolite: NEGATIVE ng/mL (ref <25)

## 2023-09-03 ENCOUNTER — Encounter (HOSPITAL_BASED_OUTPATIENT_CLINIC_OR_DEPARTMENT_OTHER): Payer: Self-pay | Admitting: Physical Therapy

## 2023-09-03 ENCOUNTER — Ambulatory Visit (HOSPITAL_BASED_OUTPATIENT_CLINIC_OR_DEPARTMENT_OTHER): Attending: Podiatry | Admitting: Physical Therapy

## 2023-09-03 DIAGNOSIS — R262 Difficulty in walking, not elsewhere classified: Secondary | ICD-10-CM | POA: Insufficient documentation

## 2023-09-03 DIAGNOSIS — M6281 Muscle weakness (generalized): Secondary | ICD-10-CM | POA: Insufficient documentation

## 2023-09-03 DIAGNOSIS — M25572 Pain in left ankle and joints of left foot: Secondary | ICD-10-CM | POA: Insufficient documentation

## 2023-09-03 DIAGNOSIS — M25571 Pain in right ankle and joints of right foot: Secondary | ICD-10-CM | POA: Insufficient documentation

## 2023-09-03 NOTE — Therapy (Signed)
 Pt arrives without clothing to swim.  She cancels next aquatic session due to a conflict in her schedule so is placed on [facility].  She is to be seen land based next week for assignment of HEP.  She continues to report she will be gaining access to pool in next few weeks.

## 2023-09-05 ENCOUNTER — Ambulatory Visit (HOSPITAL_BASED_OUTPATIENT_CLINIC_OR_DEPARTMENT_OTHER): Admitting: Physical Therapy

## 2023-09-10 ENCOUNTER — Other Ambulatory Visit: Payer: Self-pay | Admitting: Nurse Practitioner

## 2023-09-10 ENCOUNTER — Ambulatory Visit

## 2023-09-10 ENCOUNTER — Ambulatory Visit (HOSPITAL_BASED_OUTPATIENT_CLINIC_OR_DEPARTMENT_OTHER): Admitting: Physical Therapy

## 2023-09-10 ENCOUNTER — Encounter (HOSPITAL_BASED_OUTPATIENT_CLINIC_OR_DEPARTMENT_OTHER): Payer: Self-pay | Admitting: Physical Therapy

## 2023-09-10 ENCOUNTER — Ambulatory Visit
Admission: RE | Admit: 2023-09-10 | Discharge: 2023-09-10 | Disposition: A | Discharge status: Home / Self Care | Source: Ambulatory Visit | Attending: Nurse Practitioner | Admitting: Nurse Practitioner

## 2023-09-10 DIAGNOSIS — R921 Mammographic calcification found on diagnostic imaging of breast: Secondary | ICD-10-CM

## 2023-09-10 DIAGNOSIS — R262 Difficulty in walking, not elsewhere classified: Secondary | ICD-10-CM | POA: Diagnosis present

## 2023-09-10 DIAGNOSIS — R928 Other abnormal and inconclusive findings on diagnostic imaging of breast: Secondary | ICD-10-CM

## 2023-09-10 DIAGNOSIS — M6281 Muscle weakness (generalized): Secondary | ICD-10-CM | POA: Diagnosis present

## 2023-09-10 DIAGNOSIS — M25572 Pain in left ankle and joints of left foot: Secondary | ICD-10-CM | POA: Diagnosis present

## 2023-09-10 DIAGNOSIS — M25571 Pain in right ankle and joints of right foot: Secondary | ICD-10-CM | POA: Diagnosis present

## 2023-09-10 NOTE — Therapy (Signed)
 OUTPATIENT PHYSICAL THERAPY LOWER EXTREMITY TREATMENT   Patient Name: Kelsey Wilson MRN: [id number] DOB:30-Nov-1960, 63 y.o., female Today's Date: 09/10/2023  END OF SESSION:  PT End of Session - 09/10/23 1617     Visit Number 11    Number of Visits 16    Date for PT Re-Evaluation 09/20/23    Authorization Type [employer]    PT Start Time 1615    PT Stop Time 1653    PT Time Calculation (min) 38 min    Behavior During Therapy WFL for tasks assessed/performed                 Past Medical History:  Diagnosis Date   Anemia    Anxiety    Arthritis    Breast cancer (HCC) 2013   T3N1 invasive ductal carcinoma left breast.Takes Arimidex daily   Bursitis    Carpal tunnel syndrome    Chronic back pain    stenosis   Constipation    takes Colace daily   Depression    takes Benzotropine daily   Diverticulitis of colon    Dyspnea    daily when walking for over 1 yr.   Fibromyalgia 08/2012   GERD (gastroesophageal reflux disease)    takes Dexilant daily   Hemorrhoid    History of blood transfusion    no abnormal reaction noted   History of colon polyps    benign   History of shingles    Joint pain    Joint swelling    Morbid obesity (HCC)    Night muscle spasms    takes Flexeril nightly as needed   Nocturia    OSA (obstructive sleep apnea)    OSA on CPAP    Peripheral edema    takes Furosemide.Just started 01/18/16   Peripheral neuropathy    takes Lyrica daily   Personal history of chemotherapy    2013   Personal history of radiation therapy    2013   Pneumonia    hx of-2015   Pseudoarthrosis of lumbar spine    Seasonal allergies    takes Singulair nightly   SOB (shortness of breath) on exertion    rarely with exertion   Splenorenal shunt malfunction (HCC)    stable splenorenal shunt 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 Midge Aver)   Type II diabetes mellitus (HCC)    Past Surgical History:   Procedure Laterality Date   ABDOMINAL HYSTERECTOMY     still has ovaries   APPENDECTOMY     AXILLARY LYMPH NODE DISSECTION  11/28/2011   Procedure: AXILLARY LYMPH NODE DISSECTION;  Surgeon: Mariella Saa, MD;  Location: [facility] OR;  Service: General;  Laterality: Left;   BREAST LUMPECTOMY Left 11/28/2011   Malignant   BREAST SURGERY Left 2013   CARPAL TUNNEL RELEASE     Bilateral   CESAREAN SECTION     pt. has had 3   CHOLECYSTECTOMY     COLONOSCOPY     COLONOSCOPY WITH PROPOFOL N/A 01/24/2021   Procedure: COLONOSCOPY WITH PROPOFOL;  Surgeon: Sherrilyn Rist, MD;  Location: [facility];  Service: Gastroenterology;  Laterality: N/A;   ESOPHAGOGASTRODUODENOSCOPY (EGD) WITH PROPOFOL N/A 01/24/2021   Procedure: ESOPHAGOGASTRODUODENOSCOPY (EGD) WITH PROPOFOL;  Surgeon: Sherrilyn Rist, MD;  Location: [facility];  Service: Gastroenterology;  Laterality: N/A;   EYE SURGERY Bilateral    cataract removal   KNEE SURGERY     Left Knee  LAMINECTOMY WITH POSTERIOR LATERAL ARTHRODESIS LEVEL 1 N/A 11/29/2017   Procedure: Posterior Lateral Fusion - Lumbar Four-Lumbar Five, removal and replacement of hardware, Laminectomy - Lumbar Four-Lumbar Five;  Surgeon: Tia Alert, MD;  Location: [facility] OR;  Service: Neurosurgery;  Laterality: N/A;   LAMINECTOMY WITH POSTERIOR LATERAL ARTHRODESIS LEVEL 2 N/A 06/07/2017   Procedure: Posterior Lateral Fusion Lumbar Three-Four and Transforaminal Interbody Fusion Lumbar Four-Five with Segmental  Pedicle Screw Fixation;  Surgeon: Tia Alert, MD;  Location: [facility] OR;  Service: Neurosurgery;  Laterality: N/A;  Posterior Lateral Fusion Lumbar Three-Four and Transforaminal Interbody Fusion Lumbar Four-Five with Segmental  Pedicle Screw Fixation    LUMBAR FUSION  06/07/2017   POST   LUMBAR LAMINECTOMY/DECOMPRESSION MICRODISCECTOMY Bilateral 01/27/2016   Procedure: Laminectomy and Foraminotomy - Lumbar four -Lumbar five - bilateral- on-lay noninstrumented fusion;   Surgeon: Tia Alert, MD;  Location: [facility];  Service: Neurosurgery;  Laterality: Bilateral;   MULTIPLE EXTRACTIONS WITH ALVEOLOPLASTY N/A 05/11/2016   Procedure: EXTRACTION OF TEETH EIGHTEEN, TWENTY AND TWENTY- NINE;  REMOVAL OF MANDIBULAR TORUS AND EXOSTOSIS;  Surgeon: Ocie Doyne, DDS;  Location: [facility] OR;  Service: Oral Surgery;  Laterality: N/A;   POLYPECTOMY  01/24/2021   Procedure: POLYPECTOMY;  Surgeon: Sherrilyn Rist, MD;  Location: [facility];  Service: Gastroenterology;;   PORTACATH PLACEMENT  06/18/2011   Procedure: INSERTION PORT-A-CATH;  Surgeon: Mariella Saa, MD;  Location:  SURGERY CENTER;  Service: General;  Laterality: Right;  right subclavian   removal portacath  2014   spur     Apex spur on both big toes   TOE SURGERY Bilateral    TONSILLECTOMY     TOTAL KNEE ARTHROPLASTY Left 09/13/2014   TOTAL KNEE ARTHROPLASTY Left 09/13/2014   Procedure: LEFT TOTAL KNEE ARTHROPLASTY;  Surgeon: Samson Frederic, MD;  Location: [facility] OR;  Service: Orthopedics;  Laterality: Left;   TOTAL KNEE ARTHROPLASTY Right 03/10/2015   Procedure: RIGHT TOTAL KNEE ARTHROPLASTY;  Surgeon: Samson Frederic, MD;  Location: [facility];  Service: Orthopedics;  Laterality: Right;   Patient Active Problem List   Diagnosis Date Noted   Drug-induced polyneuropathy (HCC) 04/08/2023   Morbid obesity (HCC)    Type II diabetes mellitus (HCC)    GERD (gastroesophageal reflux disease) 06/21/2019   Insomnia 03/03/2019   Ankle arthritis 12/12/2018   Muscle weakness 12/12/2018   Obstructive sleep apnea 10/23/2018   Asthmatic bronchitis 10/23/2018   Cellulitis of internal cheek, left 01/23/2018   Lung nodule 09/15/2017   Morbid obesity with BMI of 45.0-49.9, adult (HCC) 09/05/2017   Arthritis of carpometacarpal ([facility]) joints of both thumbs 03/14/2017   Spinal stenosis, lumbar region, with neurogenic claudication 03/07/2017   Vertigo 10/12/2016   Peripheral positional vertigo 08/28/2016   Abnormal  auditory perception of both ears 07/27/2016   Neuropathic pain 06/25/2016   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   S/P lumbar spinal fusion 01/27/2016   H/O therapeutic radiation 09/21/2015   Personal history of breast cancer 09/21/2015   Pes planus of both feet 08/04/2015   Primary osteoarthritis of right knee 03/10/2015   Osteoarthritis of right knee 02/04/2015   Primary osteoarthritis of left knee 09/13/2014   Hot flashes 01/19/2014   Abdominal pain 01/19/2014   Malignant neoplasm of upper-outer quadrant of left breast in female, estrogen receptor positive (HCC) 03/19/2013   Lumbosacral spondylosis without myelopathy 12/30/2012   Fibromyalgia syndrome 08/15/2012   Peripheral neuropathy, toxic 08/15/2012   Lymphedema of arm - left 04/30/2012  PCP: Noreene Filbert NP  REFERRING PROVIDER: Vivi Barrack, DPM   REFERRING DIAG:  5046404317 (ICD-10-CM) - Capsulitis of ankle, left  M77.52 (ICD-10-CM) - Tendonitis of ankle, left    THERAPY DIAG:  Pain in left ankle and joints of left foot  Difficulty in walking, not elsewhere classified  Muscle weakness (generalized)  Pain in right ankle and joints of right foot  Rationale for Evaluation and Treatment: Rehabilitation  ONSET DATE: >5  SUBJECTIVE:   SUBJECTIVE STATEMENT: Pt plans to sign up at [facility]. Would like to do water aerobics there.  "I think I'm doing a lot better than I was"     POOL ACCESS:  Initial subjective Had br cancer 12 years ago hand and feet hurt from the chemo. Have peripheral neuropathies due to DM and chemo.  Also dx with fibromyalgia.  Trying water PT to avoid ankle surgery.  Both ankle feet involvement. Can stand for maybe 15 minutes before needing to sit.  PERTINENT HISTORY: Chronic ankle pain; Lumbar lam and fusion 2023 Spinal stenosis TKR 2016 PAIN:  Are you having pain? Yes: NPRS scale: current 7/10 Pain location: Rt hand and forearm , Lt hand  Pain description:  achey   Aggravating factors: sitting in hard chairs Relieving factors: sidelying  PRECAUTIONS: Fall  RED FLAGS: None   WEIGHT BEARING RESTRICTIONS: No  FALLS:  Has patient fallen in last 6 months? Yes. Number of falls 1 on 2/19  LIVING ENVIRONMENT: Lives with: lives with their family Lives in: House/apartment Stairs: 2-3 to enter home   OCCUPATION: disabled  PLOF: Independent  PATIENT GOALS: decrease ankle pain, put off ankle surgery  NEXT MD VISIT:   OBJECTIVE:  Note: Objective measures were completed at Evaluation unless otherwise noted.  DIAGNOSTIC FINDINGS: N/a  PATIENT SURVEYS:  FOTO Primary measure 44% with goal of 51%  COGNITION: Overall cognitive status: Within functional limits for tasks assessed     SENSATION: WFL  EDEMA:  Mild edema about bilat ankles    PALPATION: mild global tenderness around the ankle on both medial, lateral aspects, Achilles tendon bilaterally with minimal edema.   LOWER EXTREMITY ROM:  Active ROM Right eval Left eval  Hip flexion    Hip extension    Hip abduction    Hip adduction    Hip internal rotation    Hip external rotation    Knee flexion    Knee extension    Ankle dorsiflexion 5 2  Ankle plantarflexion 16 8  Ankle inversion    Ankle eversion     (Blank rows = not tested)  LOWER EXTREMITY MMT:  MMT Right eval Left eval R / L 3/5 R / L 3/26  Hip flexion 4 4    Hip extension      Hip abduction      Hip adduction      Hip internal rotation      Hip external rotation      Knee flexion 4 4    Knee extension 4 4    Ankle dorsiflexion 3+ P! 3P! 4 / 4- 4 /4  Ankle plantarflexion 3P! 3P! 4- /4- 4 /4  Ankle inversion Not tolerated Not tolerated 3/5 4-/5  Ankle eversion Not tolerated Not tolerated 3/5 4-/5   (Blank rows = not tested)  LOWER EXTREMITY SPECIAL TESTS:  Ankle special tests: Anterior drawer test: positive  and Thompson's test: positive prone knee bent squeeze calf for sudden pf pos if  pain  FUNCTIONAL TESTS:  Timed up and go (TUG): 17.84  GAIT: Distance walked: 400 ft 2 rest periods Assistive device utilized: None Level of assistance: Modified independence Comments: Short quick steps, minimal heel strike (like walking on egg shells)                                                                                                                                TREATMENT  [facility] Adult PT Treatment:                                                DATE: 09/10/23  Therapeutic Exercise: NuStep LE/UE, L4 x 8.5 min for cardio; therapist present to monitor [employer] Leg press 20# x 15; x 10 Cybex Row  15# x 20 Cybex lat pull down 15# x 15 [employer] calf raises 10# x 15 Pt demo stretches she has been performing at home (standing hamstring stretch, side lunge, overhead reach to side) R/L hip flexor/calf stretch at wall x 15 sec with cues for posture/ form.    Last pool appt:  Pt seen for aquatic therapy today.  Treatment took place in water 3.5-4.75 ft in depth at the [facility] pool. Temp of water was 91.  Pt entered/exited the pool via stairs using step-to pattern with hand rail.  *Unsupported: walking forward/backward with row motion  - squatted rest period *Ue support wall 4.2 ft: toe raises, heel raises; high knee marching  - squatted rest periods * Toe walking forward/back x 2 widths ea 4.0 ft ue support barbell *seated on lift: cycling; hip add/abd and hip flex/ext; ankle ROM all directions all planes   PATIENT EDUCATION:  Education details: aquatic therapy exercise progressions/ modifications  Person educated: Patient Education method: Explanation Education comprehension: verbalized understanding  HOME EXERCISE PROGRAM: TBA  ASSESSMENT:  CLINICAL IMPRESSION: Pt plans to join [employer].  Pt shown some land equipment she can use when not exercising in water.  Pt tolerated exercises without increase in pain. PmHx does include chronic  conditions which are likely responsible for slow progress (peripheral neuropathies due to chemotherapy, fibromyalgia, and morbid obesity).  Plan to instruct on final aquatic HEP next session then possibly d/c. Will ck ankle ROM and remaining goals.      OBJECTIVE IMPAIRMENTS: Abnormal gait, decreased activity tolerance, decreased endurance, decreased mobility, difficulty walking, increased muscle spasms, impaired sensation, obesity, and pain.   ACTIVITY LIMITATIONS: standing, squatting, stairs, and locomotion level  PARTICIPATION LIMITATIONS: meal prep, cleaning, laundry, shopping, community activity, and yard work  PERSONAL FACTORS: Past/current experiences, Time since onset of injury/illness/exacerbation, and 3+ comorbidities: see PmHx  are also affecting patient's functional outcome.   REHAB POTENTIAL: Good  CLINICAL DECISION MAKING: Evolving/moderate complexity  EVALUATION COMPLEXITY: Moderate   GOALS: Goals reviewed with patient? Yes  SHORT TERM GOALS/LONG TERM GOALS:  Target date: 09/20/23 Pt will tolerate full aquatic sessions consistently without increase in pain and with improving function to demonstrate good toleration and effectiveness of intervention.  Baseline: Goal status: MET - 07/16/23  2.  Pt will tolerate sitting in hard chair (kitchen) x 30 minutes without increased pain Baseline: 30 with increase in pain Goal status: In process (no change) 08/07/23; 08/28/22; 4/8   3.  Pt will report tolerating standing > 20 minutes to cook or get through grocery store Baseline: can shop for 1 hr holding cart, but 15-14min for cooking  Goal status: Partially Met - 09/10/23  4.  Pt will improve ankle ROM by 50% Baseline: see chart Goal status: In progress 08/07/23  5.  Pt will be indep with final HEP's (land and aquatic as appropriate) for continued management of condition Baseline:  Goal status: In progress 09/10/23  6.  Pt will report decrease in worst ankle/foot pain 3 NPRS for  improved toleration to activity Baseline:  5-6/10  Goal status: met - 09/10/23     PLAN:  PT FREQUENCY: 1-2x/week  PT DURATION: 6 weeks aquatic and land  PLANNED INTERVENTIONS: 97164- PT Re-evaluation, 97110-Therapeutic exercises, 97530- Therapeutic activity, 97112- Neuromuscular re-education, 97535- Self Care, 29562- Manual therapy, [id number]- Gait training, [phone]- Orthotic Fit/training, [id number]- Aquatic Therapy, 97014- Electrical stimulation (unattended), [id number]- Ionotophoresis 4mg /ml Dexamethasone, Patient/Family education, Balance training, Stair training, Taping, Dry Needling, Joint mobilization, DME instructions, Cryotherapy, and Moist heat  PLAN FOR NEXT SESSION: aquatic: ankle ROM and strengthening. Balance retraining. Pain management  Mayer Camel, PTA 09/10/23 5:53 PM [facility] [address] [place], [place], [address] Phone: [phone]   Fax:  [phone]

## 2023-09-13 ENCOUNTER — Other Ambulatory Visit: Payer: Self-pay | Admitting: Family Medicine

## 2023-09-13 ENCOUNTER — Encounter (HOSPITAL_BASED_OUTPATIENT_CLINIC_OR_DEPARTMENT_OTHER): Payer: Self-pay | Admitting: Physical Therapy

## 2023-09-13 ENCOUNTER — Ambulatory Visit (HOSPITAL_BASED_OUTPATIENT_CLINIC_OR_DEPARTMENT_OTHER): Admitting: Physical Therapy

## 2023-09-13 DIAGNOSIS — M25572 Pain in left ankle and joints of left foot: Secondary | ICD-10-CM | POA: Diagnosis not present

## 2023-09-13 DIAGNOSIS — R921 Mammographic calcification found on diagnostic imaging of breast: Secondary | ICD-10-CM

## 2023-09-13 DIAGNOSIS — M25571 Pain in right ankle and joints of right foot: Secondary | ICD-10-CM

## 2023-09-13 DIAGNOSIS — M6281 Muscle weakness (generalized): Secondary | ICD-10-CM

## 2023-09-13 DIAGNOSIS — R262 Difficulty in walking, not elsewhere classified: Secondary | ICD-10-CM

## 2023-09-13 NOTE — Therapy (Addendum)
 OUTPATIENT PHYSICAL THERAPY LOWER EXTREMITY TREATMENT PHYSICAL THERAPY DISCHARGE SUMMARY  Visits from Start of Care: 12  Current functional level related to goals / functional outcomes: indep   Remaining deficits: Chronic pain   Education / Equipment: Management of pain; HEP   Patient agrees to discharge. Patient goals were partially met. Patient is being discharged due to being pleased with the current functional level.  Addend Adriana Hopping Laneta Pintos) Ziemba MPT 10/08/23 3:01 PM [facility] [address] [place], [place], [address] Phone: [phone]   Fax:  [phone]    Patient Name: Kelsey Wilson MRN: [id number] DOB:06-14-1960, 63 y.o., female Today's Date: 09/13/2023  END OF SESSION:  PT End of Session - 09/13/23 1603     Visit Number 12    Number of Visits 16    Date for PT Re-Evaluation 09/20/23    Authorization Type [employer] Mcr    PT Start Time 1557    PT Stop Time 1635    PT Time Calculation (min) 38 min    Activity Tolerance Patient tolerated treatment well    Behavior During Therapy [facility] for tasks assessed/performed            Past Medical History:  Diagnosis Date   Anemia    Anxiety    Arthritis    Breast cancer (HCC) 2013   T3N1 invasive ductal carcinoma left breast.Takes Arimidex  daily   Bursitis    Carpal tunnel syndrome    Chronic back pain    stenosis   Constipation    takes Colace daily   Depression    takes Benzotropine daily   Diverticulitis of colon    Dyspnea    daily when walking for over 1 yr.   Fibromyalgia 08/2012   GERD (gastroesophageal reflux disease)    takes Dexilant  daily   Hemorrhoid    History of blood transfusion    no abnormal reaction noted   History of colon polyps    benign   History of shingles    Joint pain    Joint swelling    Morbid obesity (HCC)    Night muscle spasms    takes Flexeril  nightly as needed   Nocturia    OSA (obstructive sleep apnea)     OSA on CPAP    Peripheral edema    takes Furosemide .Just started 01/18/16   Peripheral neuropathy    takes Lyrica  daily   Personal history of chemotherapy    2013   Personal history of radiation therapy    2013   Pneumonia    hx of-2015   Pseudoarthrosis of lumbar spine    Seasonal allergies    takes Singulair  nightly   SOB (shortness of breath) on exertion    rarely with exertion   Splenorenal shunt malfunction (HCC)    stable splenorenal shunt 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 Elvira Hammersmith)   Type II diabetes mellitus (HCC)    Past Surgical History:  Procedure Laterality Date   ABDOMINAL HYSTERECTOMY     still has ovaries   APPENDECTOMY     AXILLARY LYMPH NODE DISSECTION  11/28/2011   Procedure: AXILLARY LYMPH NODE DISSECTION;  Surgeon: Quitman Bucy, MD;  Location: [facility];  Service: General;  Laterality: Left;   BREAST LUMPECTOMY Left 11/28/2011   Malignant   BREAST SURGERY Left 2013   CARPAL TUNNEL RELEASE     Bilateral   CESAREAN SECTION     pt. has  had 3   CHOLECYSTECTOMY     COLONOSCOPY     COLONOSCOPY WITH PROPOFOL  N/A 01/24/2021   Procedure: COLONOSCOPY WITH PROPOFOL ;  Surgeon: Albertina Hugger, MD;  Location: [facility];  Service: Gastroenterology;  Laterality: N/A;   ESOPHAGOGASTRODUODENOSCOPY (EGD) WITH PROPOFOL  N/A 01/24/2021   Procedure: ESOPHAGOGASTRODUODENOSCOPY (EGD) WITH PROPOFOL ;  Surgeon: Albertina Hugger, MD;  Location: [facility];  Service: Gastroenterology;  Laterality: N/A;   EYE SURGERY Bilateral    cataract removal   KNEE SURGERY     Left Knee   LAMINECTOMY WITH POSTERIOR LATERAL ARTHRODESIS LEVEL 1 N/A 11/29/2017   Procedure: Posterior Lateral Fusion - Lumbar Four-Lumbar Five, removal and replacement of hardware, Laminectomy - Lumbar Four-Lumbar Five;  Surgeon: Isadora Mar, MD;  Location: [facility] OR;  Service: Neurosurgery;  Laterality: N/A;   LAMINECTOMY WITH POSTERIOR LATERAL  ARTHRODESIS LEVEL 2 N/A 06/07/2017   Procedure: Posterior Lateral Fusion Lumbar Three-Four and Transforaminal Interbody Fusion Lumbar Four-Five with Segmental  Pedicle Screw Fixation;  Surgeon: Isadora Mar, MD;  Location: [facility] OR;  Service: Neurosurgery;  Laterality: N/A;  Posterior Lateral Fusion Lumbar Three-Four and Transforaminal Interbody Fusion Lumbar Four-Five with Segmental  Pedicle Screw Fixation    LUMBAR FUSION  06/07/2017   POST   LUMBAR LAMINECTOMY/DECOMPRESSION MICRODISCECTOMY Bilateral 01/27/2016   Procedure: Laminectomy and Foraminotomy - Lumbar four -Lumbar five - bilateral- on-lay noninstrumented fusion;  Surgeon: Isadora Mar, MD;  Location: [facility];  Service: Neurosurgery;  Laterality: Bilateral;   MULTIPLE EXTRACTIONS WITH ALVEOLOPLASTY N/A 05/11/2016   Procedure: EXTRACTION OF TEETH EIGHTEEN, TWENTY AND TWENTY- NINE;  REMOVAL OF MANDIBULAR TORUS AND EXOSTOSIS;  Surgeon: Ascencion Lava, DDS;  Location: [facility];  Service: Oral Surgery;  Laterality: N/A;   POLYPECTOMY  01/24/2021   Procedure: POLYPECTOMY;  Surgeon: Albertina Hugger, MD;  Location: [facility];  Service: Gastroenterology;;   PORTACATH PLACEMENT  06/18/2011   Procedure: INSERTION PORT-A-CATH;  Surgeon: Quitman Bucy, MD;  Location: [facility];  Service: General;  Laterality: Right;  right subclavian   removal portacath  2014   spur     Apex spur on both big toes   TOE SURGERY Bilateral    TONSILLECTOMY     TOTAL KNEE ARTHROPLASTY Left 09/13/2014   TOTAL KNEE ARTHROPLASTY Left 09/13/2014   Procedure: LEFT TOTAL KNEE ARTHROPLASTY;  Surgeon: Adonica Hoose, MD;  Location: [facility];  Service: Orthopedics;  Laterality: Left;   TOTAL KNEE ARTHROPLASTY Right 03/10/2015   Procedure: RIGHT TOTAL KNEE ARTHROPLASTY;  Surgeon: Adonica Hoose, MD;  Location: [facility];  Service: Orthopedics;  Laterality: Right;   Patient Active Problem List   Diagnosis Date Noted   Drug-induced polyneuropathy (HCC)  04/08/2023   Morbid obesity (HCC)    Type II diabetes mellitus (HCC)    GERD (gastroesophageal reflux disease) 06/21/2019   Insomnia 03/03/2019   Ankle arthritis 12/12/2018   Muscle weakness 12/12/2018   Obstructive sleep apnea 10/23/2018   Asthmatic bronchitis 10/23/2018   Cellulitis of internal cheek, left 01/23/2018   Lung nodule 09/15/2017   Morbid obesity with BMI of 45.0-49.9, adult (HCC) 09/05/2017   Arthritis of carpometacarpal (CMC) joints of both thumbs 03/14/2017   Spinal stenosis, lumbar region, with neurogenic claudication 03/07/2017   Vertigo 10/12/2016   Peripheral positional vertigo 08/28/2016   Abnormal auditory perception of both ears 07/27/2016   Neuropathic pain 06/25/2016   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   S/P lumbar spinal fusion 01/27/2016   H/O therapeutic radiation  09/21/2015   Personal history of breast cancer 09/21/2015   Pes planus of both feet 08/04/2015   Primary osteoarthritis of right knee 03/10/2015   Osteoarthritis of right knee 02/04/2015   Primary osteoarthritis of left knee 09/13/2014   Hot flashes 01/19/2014   Abdominal pain 01/19/2014   Malignant neoplasm of upper-outer quadrant of left breast in female, estrogen receptor positive (HCC) 03/19/2013   Lumbosacral spondylosis without myelopathy 12/30/2012   Fibromyalgia syndrome 08/15/2012   Peripheral neuropathy, toxic 08/15/2012   Lymphedema of arm - left 04/30/2012    PCP: Joanell Mowers NP  REFERRING PROVIDER: Charity Conch, DPM   REFERRING DIAG:  [phone] (ICD-10-CM) - Capsulitis of ankle, left  M77.52 (ICD-10-CM) - Tendonitis of ankle, left    THERAPY DIAG:  Pain in left ankle and joints of left foot  Difficulty in walking, not elsewhere classified  Muscle weakness (generalized)  Pain in right ankle and joints of right foot  Rationale for Evaluation and Treatment: Rehabilitation  ONSET DATE: >5  SUBJECTIVE:   SUBJECTIVE STATEMENT: Pt reports she is doing  well.  Knee is stiff from weather.  Ankles have felt better.  Plans to join [employer]     POOL ACCESS:  Initial subjective Had br cancer 12 years ago hand and feet hurt from the chemo. Have peripheral neuropathies due to DM and chemo.  Also dx with fibromyalgia.  Trying water PT to avoid ankle surgery.  Both ankle feet involvement. Can stand for maybe 15 minutes before needing to sit.  PERTINENT HISTORY: Chronic ankle pain; Lumbar lam and fusion 2023 Spinal stenosis TKR 2016 PAIN:  Are you having pain? Yes: NPRS scale: current 3/10 Pain location: bilat knees  Pain description:  stiff Aggravating factors: sitting in hard chairs Relieving factors: sidelying  PRECAUTIONS: Fall  RED FLAGS: None   WEIGHT BEARING RESTRICTIONS: No  FALLS:  Has patient fallen in last 6 months? Yes. Number of falls 1 on 2/19  LIVING ENVIRONMENT: Lives with: lives with their family Lives in: House/apartment Stairs: 2-3 to enter home   OCCUPATION: disabled  PLOF: Independent  PATIENT GOALS: decrease ankle pain, put off ankle surgery  NEXT MD VISIT:   OBJECTIVE:  Note: Objective measures were completed at Evaluation unless otherwise noted.  DIAGNOSTIC FINDINGS: N/a  PATIENT SURVEYS:  FOTO Primary measure 44% with goal of 51%  COGNITION: Overall cognitive status: Within functional limits for tasks assessed     SENSATION: WFL  EDEMA:  Mild edema about bilat ankles    PALPATION: mild global tenderness around the ankle on both medial, lateral aspects, Achilles tendon bilaterally with minimal edema.   LOWER EXTREMITY ROM:  Active ROM Right eval Left eval  Hip flexion    Hip extension    Hip abduction    Hip adduction    Hip internal rotation    Hip external rotation    Knee flexion    Knee extension    Ankle dorsiflexion 5 2  Ankle plantarflexion 16 8  Ankle inversion    Ankle eversion     (Blank rows = not tested)  LOWER EXTREMITY MMT:  MMT Right eval Left eval R /  L 3/5 R / L 3/26  Hip flexion 4 4    Hip extension      Hip abduction      Hip adduction      Hip internal rotation      Hip external rotation      Knee flexion 4 4  Knee extension 4 4    Ankle dorsiflexion 3+ P! 3P! 4 / 4- 4 /4  Ankle plantarflexion 3P! 3P! 4- /4- 4 /4  Ankle inversion Not tolerated Not tolerated 3/5 4-/5  Ankle eversion Not tolerated Not tolerated 3/5 4-/5   (Blank rows = not tested)  LOWER EXTREMITY SPECIAL TESTS:  Ankle special tests: Anterior drawer test: positive  and Thompson's test: positive prone knee bent squeeze calf for sudden pf pos if pain  FUNCTIONAL TESTS:  Timed up and go (TUG): 17.84  GAIT: Distance walked: 400 ft 2 rest periods Assistive device utilized: None Level of assistance: Modified independence Comments: Short quick steps, minimal heel strike (like walking on egg shells)                                                                                                                                TREATMENT  Treatment:                                                DATE: 09/13/23 Pt seen for aquatic therapy today.  Treatment took place in water 3.5-4.75 ft in depth at the [facility] pool. Temp of water was 91.  Pt entered/exited the pool via stairs using step-to pattern with hand rail.   *UE on yellow hand floats: walking forward/backward with row motion, multiple laps  * side stepping with arm addct/abdct with rainbow hand floats x 2 laps   - squatted rest period at wall *Ue support wall 4.2 ft: heel/toe raises x 10;  Hip abdct/ addct x 10 each LE * return to walking backwards/ forwards with row motion with yellow hand floats -> marching  * UE on yellow hand floats: squats x 5 -> without support x 10 * unsupported in 62ft: wide stance with bilat shoulder horiz abdct/ addct x 10; bilat shoulder flex/ ext x 10 * walking forward unsupported    [facility] Adult PT Treatment:                                                DATE:  09/10/23  Therapeutic Exercise: NuStep LE/UE, L4 x 8.5 min for cardio; therapist present to monitor [employer] Leg press 20# x 15; x 10 Cybex Row  15# x 20 Cybex lat pull down 15# x 15 Life fitness calf raises 10# x 15 Pt demo stretches she has been performing at home (standing hamstring stretch, side lunge, overhead reach to side) R/L hip flexor/calf stretch at wall x 15 sec with cues for posture/ form.       PATIENT EDUCATION:  Education details: aquatic therapy exercise progressions/ modifications ; aquatic HEP - handout Person  educated: Patient Education method: Explanation Education comprehension: verbalized understanding  HOME EXERCISE PROGRAM: AQUATIC Access Code: [email or web address] URL: [email or web address] Date: 09/13/2023 Prepared by: [facility] [address] This aquatic home exercise program from [employer] utilizes pictures from land based exercises, but has been adapted prior to lamination and issuance.    ASSESSMENT:  CLINICAL IMPRESSION: Pt plans to join local [employer].  Pt issued laminated aquatic exercise HEP; led through the exercises. Pt to use HEP until she feels ready to join water aerobics class.  Pt tolerated exercises well, without increase in pain.  Pt has partially met her goals. Pt pleased with current level of function and verbalized readiness to d/c to HEP.       OBJECTIVE IMPAIRMENTS: Abnormal gait, decreased activity tolerance, decreased endurance, decreased mobility, difficulty walking, increased muscle spasms, impaired sensation, obesity, and pain.   ACTIVITY LIMITATIONS: standing, squatting, stairs, and locomotion level  PARTICIPATION LIMITATIONS: meal prep, cleaning, laundry, shopping, community activity, and yard work  PERSONAL FACTORS: Past/current experiences, Time since onset of injury/illness/exacerbation, and 3+ comorbidities: see PmHx  are also affecting patient's functional outcome.   REHAB POTENTIAL:  Good  CLINICAL DECISION MAKING: Evolving/moderate complexity  EVALUATION COMPLEXITY: Moderate   GOALS: Goals reviewed with patient? Yes  SHORT TERM GOALS/LONG TERM GOALS: Target date: 09/20/23 Pt will tolerate full aquatic sessions consistently without increase in pain and with improving function to demonstrate good toleration and effectiveness of intervention.  Baseline: Goal status: MET - 07/16/23  2.  Pt will tolerate sitting in hard chair (kitchen) x 30 minutes without increased pain Baseline: 30 with increase in pain Goal status: Not met (no change) 08/07/23; 08/28/22; 4/8   3.  Pt will report tolerating standing > 20 minutes to cook or get through grocery store Baseline: can shop for 1 hr holding cart, but 15-83min for cooking  Goal status: Partially Met - 09/10/23  4.  Pt will improve ankle ROM by 50% Baseline: see chart Goal status: Not tested at d/c.   5.  Pt will be indep with final HEP's (land and aquatic as appropriate) for continued management of condition Baseline:  Goal status: MET - 09/13/23  6.  Pt will report decrease in worst ankle/foot pain 3 NPRS for improved toleration to activity Baseline:  5-6/10  Goal status: met - 09/10/23     PLAN:  PT FREQUENCY: 1-2x/week  PT DURATION: 6 weeks aquatic and land  PLANNED INTERVENTIONS: 97164- PT Re-evaluation, 97110-Therapeutic exercises, 97530- Therapeutic activity, 97112- Neuromuscular re-education, 97535- Self Care, 40981- Manual therapy, [phone]- Gait training, [phone]- Orthotic Fit/training, [phone]- Aquatic Therapy, 97014- Electrical stimulation (unattended), [phone]- Ionotophoresis 4mg /ml Dexamethasone , Patient/Family education, Balance training, Stair training, Taping, Dry Needling, Joint mobilization, DME instructions, Cryotherapy, and Moist heat   Almedia Jacobsen, PTA 09/13/23 4:47 PM [facility] [address] [place], [place], [address] Phone: [phone]    Fax:  [phone]

## 2023-09-16 ENCOUNTER — Ambulatory Visit
Admission: RE | Admit: 2023-09-16 | Discharge: 2023-09-16 | Disposition: A | Discharge status: Home / Self Care | Source: Ambulatory Visit | Attending: Nurse Practitioner | Admitting: Nurse Practitioner

## 2023-09-16 DIAGNOSIS — R921 Mammographic calcification found on diagnostic imaging of breast: Secondary | ICD-10-CM

## 2023-09-16 HISTORY — PX: BREAST BIOPSY: SHX20

## 2023-09-18 ENCOUNTER — Other Ambulatory Visit: Payer: Self-pay | Admitting: Family Medicine

## 2023-09-18 DIAGNOSIS — Z1231 Encounter for screening mammogram for malignant neoplasm of breast: Secondary | ICD-10-CM

## 2023-09-18 DIAGNOSIS — N6489 Other specified disorders of breast: Secondary | ICD-10-CM

## 2023-09-18 LAB — SURGICAL PATHOLOGY

## 2023-09-19 ENCOUNTER — Other Ambulatory Visit: Payer: Self-pay | Admitting: Physical Medicine and Rehabilitation

## 2023-09-19 NOTE — Addendum Note (Signed)
 Addended by: Mckynlee Luse M on: 09/19/2023 03:20 PM   Modules accepted: Orders

## 2023-09-19 NOTE — Telephone Encounter (Signed)
 Patient is requesting refills for Cyclobenzaprine, tramadol, gabapentin. Please send to : [facility].

## 2023-09-20 MED ORDER — TRAMADOL HCL 50 MG PO TABS: ORAL_TABLET | ORAL | 5 refills | Status: DC

## 2023-09-20 MED ORDER — GABAPENTIN 300 MG PO CAPS: ORAL_CAPSULE | ORAL | 3 refills | Status: DC

## 2023-09-20 MED ORDER — CYCLOBENZAPRINE HCL 5 MG PO TABS: ORAL_TABLET | ORAL | 0 refills | Status: DC

## 2023-09-30 ENCOUNTER — Other Ambulatory Visit

## 2023-09-30 ENCOUNTER — Ambulatory Visit (INDEPENDENT_AMBULATORY_CARE_PROVIDER_SITE_OTHER): Payer: Medicare Other | Admitting: Podiatry

## 2023-09-30 DIAGNOSIS — M2141 Flat foot [pes planus] (acquired), right foot: Secondary | ICD-10-CM

## 2023-09-30 DIAGNOSIS — E1149 Type 2 diabetes mellitus with other diabetic neurological complication: Secondary | ICD-10-CM

## 2023-09-30 DIAGNOSIS — M2142 Flat foot [pes planus] (acquired), left foot: Secondary | ICD-10-CM | POA: Diagnosis not present

## 2023-09-30 DIAGNOSIS — M778 Other enthesopathies, not elsewhere classified: Secondary | ICD-10-CM

## 2023-09-30 NOTE — Patient Instructions (Signed)

## 2023-09-30 NOTE — Progress Notes (Signed)
 Subjective: Chief Complaint  Patient presents with   [facility]    RM#33 DFC    63 year old female presents the office today with above concerns.  She states the nails got long so she trimming herself they do not need to be trimmed today.  She does not report any open lesions.  Overall she states her feet are feeling better.  She has been doing physical therapy and she started working out again as well.  No recent injury or changes otherwise.  Objective: AAO x3, NAD DP/PT pulses palpable bilaterally, CRT less than 3 seconds Sensation decreased with Semmes Weinstein monofilament. Nails recently been trimmed.  There is no edema, erythema to the nail beds.  There is no open lesions identified bilaterally. Unable to appreciate any area pinpoint tenderness.  There is still chronic appearing edema present bilaterally but no erythema or warmth.  She has tenderness on the plantar fascia but no specific area pinpoint tenderness.  Flexor, extensor tendons intact.  Decreased medial arch height. No pain with calf compression, swelling, warmth, erythema  Assessment: Chronic bilateral foot pain, type 2 diabetes with neuropathy  Plan: -All treatment options discussed with the patient including all alternatives, risks, complications.  -Overall she seems to be doing better from a pain standpoint.  Continue with physical therapy as well as exercises.  I do think she would benefit from diabetic shoes, inserts to help support her foot.  She also has neuropathy which I think is causing partly issues and better support with offloading will hopefully help. Order faxed to [facility].  -Daily foot inspection and glucose control.  Return in about 3 months (around 12/30/2023) for  diabetic foot exam, foot pain.  Charity Conch DPM

## 2023-10-08 ENCOUNTER — Encounter: Payer: Self-pay | Admitting: Internal Medicine

## 2023-10-08 ENCOUNTER — Ambulatory Visit (HOSPITAL_BASED_OUTPATIENT_CLINIC_OR_DEPARTMENT_OTHER): Admitting: Physical Therapy

## 2023-10-08 ENCOUNTER — Encounter (HOSPITAL_BASED_OUTPATIENT_CLINIC_OR_DEPARTMENT_OTHER): Payer: Self-pay

## 2023-10-08 ENCOUNTER — Other Ambulatory Visit: Payer: Self-pay

## 2023-10-08 ENCOUNTER — Ambulatory Visit: Payer: 59 | Admitting: Internal Medicine

## 2023-10-08 VITALS — BP 116/70 | HR 74 | Ht 62.0 in | Wt 271.0 lb

## 2023-10-08 DIAGNOSIS — E1142 Type 2 diabetes mellitus with diabetic polyneuropathy: Secondary | ICD-10-CM | POA: Diagnosis not present

## 2023-10-08 DIAGNOSIS — G620 Drug-induced polyneuropathy: Secondary | ICD-10-CM | POA: Diagnosis not present

## 2023-10-08 DIAGNOSIS — E559 Vitamin D deficiency, unspecified: Secondary | ICD-10-CM

## 2023-10-08 DIAGNOSIS — Z7985 Long-term (current) use of injectable non-insulin antidiabetic drugs: Secondary | ICD-10-CM | POA: Diagnosis not present

## 2023-10-08 DIAGNOSIS — E119 Type 2 diabetes mellitus without complications: Secondary | ICD-10-CM

## 2023-10-08 LAB — POCT GLYCOSYLATED HEMOGLOBIN (HGB A1C): Hemoglobin A1C: 6 % — AB (ref 4.0–5.6)

## 2023-10-08 LAB — POCT GLUCOSE (DEVICE FOR HOME USE): Glucose Fasting, POC: 88 mg/dL (ref 70–99)

## 2023-10-08 MED ORDER — CYCLOBENZAPRINE HCL 5 MG PO TABS: ORAL_TABLET | ORAL | 0 refills | Status: DC

## 2023-10-08 MED ORDER — MOUNJARO 7.5 MG/0.5ML ~~LOC~~ SOAJ
7.5000 mg | SUBCUTANEOUS | 3 refills | Status: DC
Start: 2023-10-08 — End: 2024-01-23

## 2023-10-08 NOTE — Patient Instructions (Signed)
 Continue  Mounjro 7.5 mg weekly     HOW TO TREAT LOW BLOOD SUGARS (Blood sugar LESS THAN 70 MG/DL) Please follow the RULE OF 15 for the treatment of hypoglycemia treatment (when your (blood sugars are less than 70 mg/dL)   STEP 1: Take 15 grams of carbohydrates when your blood sugar is low, which includes:  3-4 GLUCOSE TABS  OR 3-4 OZ OF JUICE OR REGULAR SODA OR ONE TUBE OF GLUCOSE GEL    STEP 2: RECHECK blood sugar in 15 MINUTES STEP 3: If your blood sugar is still low at the 15 minute recheck --> then, go back to STEP 1 and treat AGAIN with another 15 grams of carbohydrates.

## 2023-10-08 NOTE — Progress Notes (Unsigned)
 Name: Kelsey Wilson  MRN/ DOB: [id number], 04/20/1961   Age/ Sex: 63 y.o., female    PCP: Ulysees Gander, MD   Reason for Endocrinology Evaluation: Type 2 Diabetes Mellitus     Date of Initial Endocrinology Visit: 04/08/2023    PATIENT IDENTIFIER: Kelsey Wilson is a 63 y.o. female with a past medical history of DM, Hx Breast Ca  . The patient presented for initial endocrinology clinic visit on 04/08/2023  for consultative assistance with her diabetes management.    HPI: Kelsey Wilson was    Diagnosed with DM yrs ago  Prior Medications tried/Intolerance: Metformin - GI side effects. Trulicity - no intolerance  Hemoglobin A1c has ranged from 5.8% in 2024  On her initial visit to our clinic she had an A1c of 5.8%, she was on Mounjaro and glipizide .  We discontinued glipizide  due to low A1c     SUBJECTIVE:   During the last visit (04/08/2023): A1c 5.8%    Today (04/08/2023): Kelsey Wilson is here for follow-up on diabetes management.  She checks blood sugars 0 times daily.     Weight has been stable Denies nausea or vomiting  Denies constipation or diarrhea  Denies vision changes   Continues to complain of tingling and numbness of toes and fingers She does see a spine specialist, she does have physical therapy  HOME DIABETES REGIMEN: Mounjaro 7.5 mg weekly  Ergocalciferol  50,000 units weekly     Statin: yes ACE-I/ARB: yes    METER DOWNLOAD SUMMARY: n/a    DIABETIC COMPLICATIONS: Microvascular complications:  Neuropathy- chemo induced  Denies: CKD, retinopathy Last eye exam: Completed 2023  Macrovascular complications:   Denies: CAD, PVD, CVA   PAST HISTORY: Past Medical History:  Past Medical History:  Diagnosis Date   Anemia    Anxiety    Arthritis    Breast cancer (HCC) 2013   T3N1 invasive ductal carcinoma left breast.Takes Arimidex  daily   Bursitis    Carpal tunnel syndrome    Chronic back pain    stenosis   Constipation     takes Colace daily   Depression    takes Benzotropine daily   Diverticulitis of colon    Dyspnea    daily when walking for over 1 yr.   Fibromyalgia 08/2012   GERD (gastroesophageal reflux disease)    takes Dexilant  daily   Hemorrhoid    History of blood transfusion    no abnormal reaction noted   History of colon polyps    benign   History of shingles    Joint pain    Joint swelling    Morbid obesity (HCC)    Night muscle spasms    takes Flexeril  nightly as needed   Nocturia    OSA (obstructive sleep apnea)    OSA on CPAP    Peripheral edema    takes Furosemide .Just started 01/18/16   Peripheral neuropathy    takes Lyrica  daily   Personal history of chemotherapy    2013   Personal history of radiation therapy    2013   Pneumonia    hx of-2015   Pseudoarthrosis of lumbar spine    Seasonal allergies    takes Singulair  nightly   SOB (shortness of breath) on exertion    rarely with exertion   Splenorenal shunt malfunction (HCC)    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 Elvira Hammersmith)   Type II diabetes mellitus (HCC)  Past Surgical History:  Past Surgical History:  Procedure Laterality Date   ABDOMINAL HYSTERECTOMY     still has ovaries   APPENDECTOMY     AXILLARY LYMPH NODE DISSECTION  11/28/2011   Procedure: AXILLARY LYMPH NODE DISSECTION;  Surgeon: Quitman Bucy, MD;  Location: [facility];  Service: General;  Laterality: Left;   BREAST BIOPSY Right 09/16/2023   MM RT BREAST BX W LOC DEV 1ST LESION IMAGE BX SPEC STEREO GUIDE 09/16/2023 [facility]   BREAST LUMPECTOMY Left 11/28/2011   Malignant   BREAST SURGERY Left 2013   CARPAL TUNNEL RELEASE     Bilateral   CESAREAN SECTION     pt. has had 3   CHOLECYSTECTOMY     COLONOSCOPY     COLONOSCOPY WITH PROPOFOL  N/A 01/24/2021   Procedure: COLONOSCOPY WITH PROPOFOL ;  Surgeon: Albertina Hugger, MD;  Location: [facility];  Service:  Gastroenterology;  Laterality: N/A;   ESOPHAGOGASTRODUODENOSCOPY (EGD) WITH PROPOFOL  N/A 01/24/2021   Procedure: ESOPHAGOGASTRODUODENOSCOPY (EGD) WITH PROPOFOL ;  Surgeon: Albertina Hugger, MD;  Location: [facility];  Service: Gastroenterology;  Laterality: N/A;   EYE SURGERY Bilateral    cataract removal   KNEE SURGERY     Left Knee   LAMINECTOMY WITH POSTERIOR LATERAL ARTHRODESIS LEVEL 1 N/A 11/29/2017   Procedure: Posterior Lateral Fusion - Lumbar Four-Lumbar Five, removal and replacement of hardware, Laminectomy - Lumbar Four-Lumbar Five;  Surgeon: Isadora Mar, MD;  Location: [facility] OR;  Service: Neurosurgery;  Laterality: N/A;   LAMINECTOMY WITH POSTERIOR LATERAL ARTHRODESIS LEVEL 2 N/A 06/07/2017   Procedure: Posterior Lateral Fusion Lumbar Three-Four and Transforaminal Interbody Fusion Lumbar Four-Five with Segmental  Pedicle Screw Fixation;  Surgeon: Isadora Mar, MD;  [facility]: [facility];  Service: Neurosurgery;  Laterality: N/A;  Posterior Lateral Fusion Lumbar Three-Four and Transforaminal Interbody Fusion Lumbar Four-Five with Segmental  Pedicle Screw Fixation    LUMBAR FUSION  06/07/2017   POST   LUMBAR LAMINECTOMY/DECOMPRESSION MICRODISCECTOMY Bilateral 01/27/2016   Procedure: Laminectomy and Foraminotomy - Lumbar four -Lumbar five - bilateral- on-lay noninstrumented fusion;  Surgeon: Isadora Mar, MD;  Location: [facility];  Service: Neurosurgery;  Laterality: Bilateral;   MULTIPLE EXTRACTIONS WITH ALVEOLOPLASTY N/A 05/11/2016   Procedure: EXTRACTION OF TEETH EIGHTEEN, TWENTY AND TWENTY- NINE;  REMOVAL OF MANDIBULAR TORUS AND EXOSTOSIS;  Surgeon: Ascencion Lava, DDS;  Location: [facility];  Service: Oral Surgery;  Laterality: N/A;   POLYPECTOMY  01/24/2021   Procedure: POLYPECTOMY;  Surgeon: Albertina Hugger, MD;  Location: [facility];  Service: Gastroenterology;;   PORTACATH PLACEMENT  06/18/2011   Procedure: INSERTION PORT-A-CATH;  Surgeon: Quitman Bucy, MD;  Location: [facility];  Service: General;  Laterality: Right;  right subclavian   removal portacath  2014   spur     Apex spur on both big toes   TOE SURGERY Bilateral    TONSILLECTOMY     TOTAL KNEE ARTHROPLASTY Left 09/13/2014   TOTAL KNEE ARTHROPLASTY Left 09/13/2014   Procedure: LEFT TOTAL KNEE ARTHROPLASTY;  Surgeon: Adonica Hoose, MD;  Location: [facility];  Service: Orthopedics;  Laterality: Left;   TOTAL KNEE ARTHROPLASTY Right 03/10/2015   Procedure: RIGHT TOTAL KNEE ARTHROPLASTY;  Surgeon: Adonica Hoose, MD;  Location: [facility];  Service: Orthopedics;  Laterality: Right;    Social History:  reports that she has never smoked. She has never used smokeless tobacco. She reports that she does not drink alcohol and does not use drugs. Family  History:  Family History  Problem Relation Age of Onset   Hypertension Mother    Diabetes Mother    Hypertension Father    Diabetes Father    Hypertension Sister    Hyperlipidemia Brother    Breast cancer Paternal Aunt    Cancer Paternal Grandmother        unknown   Colon cancer Neg Hx    Parkinson's disease Neg Hx      HOME MEDICATIONS: Allergies as of 10/08/2023       Reactions   Other Other (See Comments)   Salicylates    Compazine  [prochlorperazine ] Other (See Comments)   Numbness of face and  lips   Hydrocodone  Itching   High dose only   Naproxen Nausea Only, Other (See Comments)   Oxycodone  Other (See Comments)   hallucinations   Penicillins Nausea Only, Other (See Comments)   Has patient had a PCN reaction causing immediate rash, facial/tongue/throat swelling, SOB or lightheadedness with hypotension: No Has patient had a PCN reaction causing severe rash involving mucus membranes or skin necrosis: No Has patient had a PCN reaction that required hospitalization No Has patient had a PCN reaction occurring within the last 10 years: No If all of the above answers are "NO", then may proceed with Cephalosporin use.        Medication List         Accurate as of Oct 08, 2023  1:59 PM. If you have any questions, ask your nurse or doctor.          albuterol  108 (90 Base) MCG/ACT inhaler Commonly known as: VENTOLIN  HFA Inhale 2 puffs into the lungs every 6 (six) hours as needed for wheezing or shortness of breath.   ALPRAZolam  0.25 MG tablet Commonly known as: XANAX  Take 0.25 mg by mouth See admin instructions. Take one tablet (0.25 mg) by mouth twice daily - morning and mid-afternoon   Anoro Ellipta  62.5-25 MCG/ACT Aepb Generic drug: umeclidinium-vilanterol 1 puff Inhalation Once a day   Anoro Ellipta  62.5-25 MCG/INH Aepb Generic drug: umeclidinium-vilanterol Inhale 1 puff into the lungs daily.   atorvastatin 20 MG tablet Commonly known as: LIPITOR Take 20 mg by mouth daily.   bumetanide  2 MG tablet Commonly known as: BUMEX  Take 1 tablet every day by oral route.   clindamycin  300 MG capsule Commonly known as: CLEOCIN  Take 600 mg by mouth See admin instructions. Take two capsules (600 mg) by mouth one hour prior to dental procedures   cloNIDine 0.1 MG tablet Commonly known as: CATAPRES Take by mouth.   cyclobenzaprine  5 MG tablet Commonly known as: FLEXERIL  TAKE 1 TABLET BY MOUTH TWICE A DAY AS NEEDED FOR MUSCLE SPASMS   Dexlansoprazole  30 MG capsule DR Take 30 mg by mouth every morning.   dicyclomine  10 MG capsule Commonly known as: BENTYL    DULoxetine  60 MG capsule Commonly known as: CYMBALTA  Take 60 mg by mouth every morning.   ezetimibe  10 MG tablet Commonly known as: ZETIA    furosemide  20 MG tablet Commonly known as: LASIX  Take 20 mg by mouth every morning.   gabapentin  300 MG capsule Commonly known as: NEURONTIN  TAKE ONE CAPSULE BY MOUTH THREE TIMES DAILY @ 9AM, 1PM, & 5PM   ipratropium 0.03 % nasal spray Commonly known as: ATROVENT    levocetirizine 5 MG tablet Commonly known as: XYZAL  Take 5 mg by mouth at bedtime.   lidocaine  5 % Commonly known as: Lidoderm  Place 1 patch  onto the skin daily. Remove & Discard  patch within 12 hours or as directed by MD   lisinopril  5 MG tablet Commonly known as: ZESTRIL  Take 5 mg by mouth every morning.   meloxicam  7.5 MG tablet Commonly known as: MOBIC    meloxicam  7.5 MG tablet Commonly known as: Mobic  Take 1 tablet (7.5 mg total) by mouth daily as needed for pain.   meloxicam  7.5 MG tablet Commonly known as: Mobic  Take 1 tablet (7.5 mg total) by mouth daily as needed for pain.   methocarbamol  500 MG tablet Commonly known as: ROBAXIN  Take 1,000 mg by mouth 3 (three) times daily.   metoprolol  succinate 25 MG 24 hr tablet Commonly known as: TOPROL -XL Take 12.5 mg by mouth every morning.   montelukast  10 MG tablet Commonly known as: SINGULAIR  Take 10 mg by mouth every morning.   Mounjaro 7.5 MG/0.5ML Pen Generic drug: tirzepatide Inject 7.5 mg into the skin once a week.   naloxone  4 MG/0.1ML Liqd nasal spray kit Commonly known as: NARCAN  Place 0.4 mg into the nose daily as needed (opioid overdose).   rosuvastatin 10 MG tablet Commonly known as: CRESTOR Take 10 mg by mouth at bedtime.   traMADol  50 MG tablet Commonly known as: ULTRAM  Take 1- 2  tablets three times a day as needed for pain.   venlafaxine  XR 37.5 MG 24 hr capsule Commonly known as: EFFEXOR -XR Take 1 tablet by mouth daily.   Vitamin D  (Ergocalciferol ) 1.25 MG (50000 UNIT) Caps capsule Commonly known as: DRISDOL  Take 1 capsule (50,000 Units total) by mouth every 7 (seven) days.         ALLERGIES: Allergies  Allergen Reactions   Other Other (See Comments)   Salicylates    Compazine  [Prochlorperazine ] Other (See Comments)    Numbness of face and  lips    Hydrocodone  Itching    High dose only   Naproxen Nausea Only and Other (See Comments)   Oxycodone  Other (See Comments)    hallucinations    Penicillins Nausea Only and Other (See Comments)    Has patient had a PCN reaction causing immediate rash, facial/tongue/throat  swelling, SOB or lightheadedness with hypotension: No Has patient had a PCN reaction causing severe rash involving mucus membranes or skin necrosis: No Has patient had a PCN reaction that required hospitalization No Has patient had a PCN reaction occurring within the last 10 years: No If all of the above answers are "NO", then may proceed with Cephalosporin use.     REVIEW OF SYSTEMS: A comprehensive ROS was conducted with the patient and is negative except as per HPI     OBJECTIVE:   VITAL SIGNS: BP 116/70 (BP Location: Left Arm, Patient Position: Sitting, Cuff Size: Normal)   Pulse 74   Ht 5\' 2"  (1.575 m)   Wt 271 lb (122.9 kg)   SpO2 99%   BMI 49.57 kg/m     Filed Weights   10/08/23 1354  Weight: 271 lb (122.9 kg)    PHYSICAL EXAM:  General: Pt appears well and is in NAD  Neck: General: Supple without adenopathy or carotid bruits. Thyroid : Thyroid  size normal.  No goiter or nodules appreciated.   Lungs: Clear with good BS bilat   Heart: RRR   Extremities:  Lower extremities - No pretibial edema.   Neuro: MS is good with appropriate affect, pt is alert and Ox3    DM foot exam: 09/30/2023 per podiatry   The skin of the feet is intact without sores or ulcerations. The pedal pulses are  2+ on right and 2+ on left. The sensation is intact to a screening 5.07, 10 gram monofilament bilaterally   DATA REVIEWED:   Latest Reference Range & Units 10/08/23 14:24  Vitamin D , 25-Hydroxy 30 - 100 ng/mL 97       Latest Reference Range & Units 04/08/23 14:36  Sodium 135 - 145 mEq/L 138  Potassium 3.5 - 5.1 mEq/L 3.9  Chloride 96 - 112 mEq/L 99  CO2 19 - 32 mEq/L 31  Glucose 70 - 99 mg/dL 84  BUN 6 - 23 mg/dL 11  Creatinine 9.56 - 2.13 mg/dL 0.86  Calcium 8.4 - 57.8 mg/dL 9.5  GFR >46.96 mL/min 53.81 (L)  Total CHOL/HDL Ratio  3  Cholesterol 0 - 200 mg/dL 295  HDL Cholesterol >28.41 mg/dL 32.44  LDL (calc) 0 - 99 mg/dL 67  MICROALB/CREAT RATIO 0.0 - 30.0 mg/g 0.9   NonHDL  87.51  Triglycerides 0.0 - 149.0 mg/dL 010.2  VLDL 0.0 - 72.5 mg/dL 36.6  VITD 44.03 - 474.25 ng/mL 9.78 (L)  Vitamin B12 211 - 911 pg/mL 301    Latest Reference Range & Units 04/08/23 14:36  TSH 0.35 - 5.50 uIU/mL 1.68    Latest Reference Range & Units 04/08/23 14:36  Creatinine,U mg/dL 95.6  Microalb, Ur 0.0 - 1.9 mg/dL <3.8  MICROALB/CREAT RATIO 0.0 - 30.0 mg/g 0.9     Old records , labs and images have been reviewed.    ASSESSMENT / PLAN / RECOMMENDATIONS:   1) Type 2 Diabetes Mellitus, Optimally controlled, With neuropathic  complications - Most recent A1c of 6.0 %. Goal A1c < 7.0 %.      A1c remains optimal Intolerant to metformin  She has tried Trulicity  without intolerance issues She was on glipizide  but I discontinued it with an A1c of 5.9% No changes  MEDICATIONS: Continue Mounjaro 7.5 mg weekly    2) Diabetic complications:  Eye: Does not have known diabetic retinopathy.  Neuro/ Feet: Does  have known diabetic peripheral neuropathy. Renal: Patient does not have known baseline CKD. She is  on an ACEI/ARB at present.    3) Vitamin D  deficiency:  - Vitamin D  has normalized, will decrease dose of ergocalciferol  as below  Medication Decrease ergocalciferol  50,000 units to every other week   4) Dyslipidemia :  -Lipid panel optimal   Medication Continue rosuvastatin 10 mg daily  5) Peripheral neuropathy:  - Patient with chronic tingling and numbness of fingers and toes, unclear if this is due to diabetes versus musculoskeletal - Will refer to neurology for further evaluation  Follow-up in 6 months  Signed electronically by: Natale Bail, MD  [facility]  [facility] [address] [place] [place], [place] [address] Phone: [phone] FAX: [phone]   CC: Ulysees Gander, MD [address] [place] [address] Phone: [phone]  Fax: [phone]    Return to Endocrinology  clinic as below: Future Appointments  Date Time Provider Department Center  10/08/2023  2:00 PM Fianna Snowball, Julian Obey, MD [facility] None  10/08/2023  4:15 PM Vena Gibes, PT [facility]  12/30/2023  3:15 PM Charity Conch, DPM [facility]  01/06/2024  2:30 PM Rosa College D, MD [facility] None  02/25/2024  2:40 PM Jodi Munroe, NP [facility] CPR  03/13/2024 11:00 AM [facility] DIAG [facility] 1 [facility]  03/13/2024 11:10 AM [facility] US  1 [facility]

## 2023-10-09 LAB — VITAMIN D 25 HYDROXY (VIT D DEFICIENCY, FRACTURES): Vit D, 25-Hydroxy: 97 ng/mL (ref 30–100)

## 2023-10-10 ENCOUNTER — Telehealth: Payer: Self-pay | Admitting: Internal Medicine

## 2023-10-10 MED ORDER — VITAMIN D (ERGOCALCIFEROL) 1.25 MG (50000 UNIT) PO CAPS: 50000.0000 [IU] | ORAL_CAPSULE | ORAL | 2 refills | Status: DC

## 2023-10-10 NOTE — Telephone Encounter (Signed)
 Please contact the patient and let her know that her vitamin D  is normal, and I would suggest switching ergocalciferol  from once weekly to once every other week, so she will be taking it twice a month     Thanks

## 2023-10-10 NOTE — Telephone Encounter (Signed)
 Patient notified and verbalized understanding.

## 2023-10-14 ENCOUNTER — Encounter: Payer: Self-pay | Admitting: Neurology

## 2023-10-16 ENCOUNTER — Ambulatory Visit (HOSPITAL_BASED_OUTPATIENT_CLINIC_OR_DEPARTMENT_OTHER): Admitting: Physical Therapy

## 2023-10-22 ENCOUNTER — Other Ambulatory Visit: Payer: Self-pay | Admitting: Internal Medicine

## 2023-11-01 ENCOUNTER — Other Ambulatory Visit: Payer: Self-pay | Admitting: Physical Medicine and Rehabilitation

## 2023-11-05 ENCOUNTER — Other Ambulatory Visit: Payer: Self-pay

## 2023-11-05 MED ORDER — VITAMIN D (ERGOCALCIFEROL) 1.25 MG (50000 UNIT) PO CAPS: ORAL_CAPSULE | ORAL | 1 refills | Status: DC

## 2023-11-20 ENCOUNTER — Encounter (HOSPITAL_BASED_OUTPATIENT_CLINIC_OR_DEPARTMENT_OTHER): Payer: Self-pay

## 2023-12-02 ENCOUNTER — Other Ambulatory Visit: Payer: Self-pay | Admitting: Physical Medicine and Rehabilitation

## 2023-12-05 ENCOUNTER — Other Ambulatory Visit: Payer: Self-pay

## 2023-12-05 DIAGNOSIS — M6281 Muscle weakness (generalized): Secondary | ICD-10-CM

## 2023-12-05 MED ORDER — CYCLOBENZAPRINE HCL 5 MG PO TABS: ORAL_TABLET | ORAL | 0 refills | Status: DC

## 2023-12-09 ENCOUNTER — Other Ambulatory Visit: Payer: Self-pay | Admitting: Physical Medicine and Rehabilitation

## 2023-12-09 DIAGNOSIS — M6281 Muscle weakness (generalized): Secondary | ICD-10-CM

## 2023-12-16 ENCOUNTER — Encounter: Payer: Self-pay | Admitting: Neurology

## 2023-12-16 ENCOUNTER — Ambulatory Visit (INDEPENDENT_AMBULATORY_CARE_PROVIDER_SITE_OTHER): Admitting: Neurology

## 2023-12-16 VITALS — BP 128/75 | HR 108 | Ht 62.0 in | Wt 277.0 lb

## 2023-12-16 DIAGNOSIS — T451X5A Adverse effect of antineoplastic and immunosuppressive drugs, initial encounter: Secondary | ICD-10-CM | POA: Diagnosis not present

## 2023-12-16 DIAGNOSIS — G620 Drug-induced polyneuropathy: Secondary | ICD-10-CM

## 2023-12-16 NOTE — Progress Notes (Signed)
 [facility] Neurology Division Clinic Note - Initial Visit   Date: 12/16/2023   Kelsey Wilson MRN: [id number] DOB: 1961-03-20   Dear Dr. Sam:  Thank you for your kind referral of Kelsey Wilson for consultation of neuropathy. Although her history is well known to you, please allow us  to reiterate it for the purpose of our medical record. The patient was accompanied to the clinic by self.   Kelsey Wilson is a 63 y.o. right-handed female with well-controlled diabetes mellitus, history of breast cancer, GERD, fibromyalgia, hypertension, chronic pain presenting for evaluation of neuropathy.   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  Discussed that management remains supportive, unfortunately, there is no cure for neuropathy.  She is seeing pain management for neuropathy as well as fibromyalgia. She takes gabapentin  300mg  TID and Cymbalta  60mg  daily. PT for balance training declined NCS/EMG was also declined Patient educated on daily foot inspection, fall prevention, and safety precautions around the home.  Return to clinic as needed  ------------------------------------------------------------- History of present illness: She had breast cancer in 2014 and reports having numbness/tingling involving the hands and feet since this time.  Her symptoms did not improve after she completed chemotherapy.  She also has diffuse whole body pain and sees pain management. She takes Cymbalta  60mg  and gabapentin  300mg  TID.  She has diabetes which is well-controlled, last HbA1c 6.0.  She has some imbalance, no recent falls.  No weakness.    Nonsmoker and does not drink alcohol.   Out-side paper records, electronic medical record, and images have been reviewed where available and summarized as:  Lab Results  Component Value Date   HGBA1C 6.0 (A) 10/08/2023   Lab Results  Component Value Date   VITAMINB12 301 04/08/2023   Lab  Results  Component Value Date   TSH 1.68 04/08/2023    Past Medical History:  Diagnosis Date   Anemia    Anxiety    Arthritis    Breast cancer (HCC) 2013   T3N1 invasive ductal carcinoma left breast.Takes Arimidex  daily   Bursitis    Carpal tunnel syndrome    Chronic back pain    stenosis   Constipation    takes Colace daily   Depression    takes Benzotropine daily   Diverticulitis of colon    Dyspnea    daily when walking for over 1 yr.   Fibromyalgia 08/2012   GERD (gastroesophageal reflux disease)    takes Dexilant  daily   Hemorrhoid    History of blood transfusion    no abnormal reaction noted   History of colon polyps    benign   History of shingles    Joint pain    Joint swelling    Morbid obesity (HCC)    Night muscle spasms    takes Flexeril  nightly as needed   Nocturia    OSA (obstructive sleep apnea)    OSA on CPAP    Peripheral edema    takes Furosemide .Just started 01/18/16   Peripheral neuropathy    takes Lyrica  daily   Personal history of chemotherapy    2013   Personal history of radiation therapy    2013   Pneumonia    hx of-2015   Pseudoarthrosis of lumbar spine    Seasonal allergies    takes Singulair  nightly   SOB (shortness of breath) on exertion    rarely with exertion   Splenorenal shunt malfunction (HCC)    stable splenorenal shunt 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 Marlana)   Type II diabetes mellitus (HCC)     Past Surgical History:  Procedure Laterality Date   ABDOMINAL HYSTERECTOMY     still has ovaries   APPENDECTOMY     AXILLARY LYMPH NODE DISSECTION  11/28/2011   Procedure: AXILLARY LYMPH NODE DISSECTION;  Surgeon: Morene ONEIDA Olives, MD;  Location: [facility] OR;  Service: General;  Laterality: Left;   BREAST BIOPSY Right 09/16/2023   MM RT BREAST BX W LOC DEV 1ST LESION IMAGE BX SPEC STEREO GUIDE 09/16/2023 [facility]   BREAST LUMPECTOMY Left 11/28/2011    Malignant   BREAST SURGERY Left 2013   CARPAL TUNNEL RELEASE     Bilateral   CESAREAN SECTION     pt. has had 3   CHOLECYSTECTOMY     COLONOSCOPY     COLONOSCOPY WITH PROPOFOL  N/A 01/24/2021   Procedure: COLONOSCOPY WITH PROPOFOL ;  Surgeon: Legrand Victory LITTIE DOUGLAS, MD;  Location: [facility];  Service: Gastroenterology;  Laterality: N/A;   ESOPHAGOGASTRODUODENOSCOPY (EGD) WITH PROPOFOL  N/A 01/24/2021   Procedure: ESOPHAGOGASTRODUODENOSCOPY (EGD) WITH PROPOFOL ;  Surgeon: Legrand Victory LITTIE DOUGLAS, MD;  Location: [facility];  Service: Gastroenterology;  Laterality: N/A;   EYE SURGERY Bilateral    cataract removal   KNEE SURGERY     Left Knee   LAMINECTOMY WITH POSTERIOR LATERAL ARTHRODESIS LEVEL 1 N/A 11/29/2017   Procedure: Posterior Lateral Fusion - Lumbar Four-Lumbar Five, removal and replacement of hardware, Laminectomy - Lumbar Four-Lumbar Five;  Surgeon: Joshua Alm RAMAN, MD;  Location: [facility] OR;  Service: Neurosurgery;  Laterality: N/A;   LAMINECTOMY WITH POSTERIOR LATERAL ARTHRODESIS LEVEL 2 N/A 06/07/2017   Procedure: Posterior Lateral Fusion Lumbar Three-Four and Transforaminal Interbody Fusion Lumbar Four-Five with Segmental  Pedicle Screw Fixation;  Surgeon: Joshua Alm RAMAN, MD;  Location[facility] OR;  Service: Neurosurgery;  Laterality: N/A;  Posterior Lateral Fusion Lumbar Three-Four and Transforaminal Interbody Fusion Lumbar Four-Five with Segmental  Pedicle Screw Fixation    LUMBAR FUSION  06/07/2017   POST   LUMBAR LAMINECTOMY/DECOMPRESSION MICRODISCECTOMY Bilateral 01/27/2016   Procedure: Laminectomy and Foraminotomy - Lumbar four -Lumbar five - bilateral- on-lay noninstrumented fusion;  Surgeon: Alm RAMAN Joshua, MD;  Location: [facility];  Service: Neurosurgery;  Laterality: Bilateral;   MULTIPLE EXTRACTIONS WITH ALVEOLOPLASTY N/A 05/11/2016   Procedure: EXTRACTION OF TEETH EIGHTEEN, TWENTY AND TWENTY- NINE;  REMOVAL OF MANDIBULAR TORUS AND EXOSTOSIS;  Surgeon: Glendia Primrose, DDS;  Location: [facility] OR;   Service: Oral Surgery;  Laterality: N/A;   POLYPECTOMY  01/24/2021   Procedure: POLYPECTOMY;  Surgeon: Legrand Victory LITTIE DOUGLAS, MD;  Location: [facility];  Service: Gastroenterology;;   PORTACATH PLACEMENT  06/18/2011   Procedure: INSERTION PORT-A-CATH;  Surgeon: Morene ONEIDA Olives, MD;  Location: [facility];  Service: General;  Laterality: Right;  right subclavian   removal portacath  2014   spur     Apex spur on both big toes   TOE SURGERY Bilateral    TONSILLECTOMY     TOTAL KNEE ARTHROPLASTY Left 09/13/2014   TOTAL KNEE ARTHROPLASTY Left 09/13/2014   Procedure: LEFT TOTAL KNEE ARTHROPLASTY;  Surgeon: Redell Shoals, MD;  Location: [facility] OR;  Service: Orthopedics;  Laterality: Left;   TOTAL KNEE ARTHROPLASTY Right 03/10/2015   Procedure: RIGHT TOTAL KNEE ARTHROPLASTY;  Surgeon: Redell Shoals, MD;  Location: [facility];  Service: Orthopedics;  Laterality: Right;     Medications:  Outpatient Encounter Medications as of 12/16/2023  Medication Sig Note   albuterol  (VENTOLIN  HFA) 108 (90 Base) MCG/ACT inhaler Inhale 2 puffs into the lungs every 6 (six) hours as needed for wheezing or shortness of breath.    ALPRAZolam  (XANAX ) 0.25 MG tablet Take 0.25 mg by mouth See admin instructions. Take one tablet (0.25 mg) by mouth twice daily - morning and mid-afternoon    atorvastatin (LIPITOR) 20 MG tablet Take 20 mg by mouth daily.    bumetanide  (BUMEX ) 2 MG tablet Take 1 tablet every day by oral route.    clindamycin  (CLEOCIN ) 300 MG capsule Take 600 mg by mouth See admin instructions. Take two capsules (600 mg) by mouth one hour prior to dental procedures (Patient not taking: Reported on 09/30/2023)    cloNIDine (CATAPRES) 0.1 MG tablet Take by mouth.    cyclobenzaprine  (FLEXERIL ) 5 MG tablet TAKE ONE TABLET BY MOUTH TWICE DAILY AS NEEDED FOR MUSCLE SPASMS. NOTE: MAXIMUM REFILLS REACHED    Dexlansoprazole  30 MG capsule DR Take 30 mg by mouth every morning.    dicyclomine  (BENTYL ) 10 MG capsule      DULoxetine  (CYMBALTA ) 60 MG capsule Take 60 mg by mouth every morning.    ezetimibe  (ZETIA ) 10 MG tablet     furosemide  (LASIX ) 20 MG tablet Take 20 mg by mouth every morning.    gabapentin  (NEURONTIN ) 300 MG capsule TAKE 1 CAPSULE BY MOUTH THREE TIMES DAILY AT 9AM, 1PM,AND 5PM    ipratropium (ATROVENT ) 0.03 % nasal spray     levocetirizine (XYZAL ) 5 MG tablet Take 5 mg by mouth at bedtime.    lidocaine  (LIDODERM ) 5 % Place 1 patch onto the skin daily. Remove & Discard patch within 12 hours or as directed by MD    lisinopril  (ZESTRIL ) 5 MG tablet Take 5 mg by mouth every morning.    meloxicam  (MOBIC ) 7.5 MG tablet     meloxicam  (MOBIC ) 7.5 MG tablet Take 1 tablet (7.5 mg total) by mouth daily as needed for pain.    meloxicam  (MOBIC ) 7.5 MG tablet Take 1 tablet (7.5 mg total) by mouth daily as needed for pain.    methocarbamol  (ROBAXIN ) 500 MG tablet Take 1,000 mg by mouth 3 (three) times daily. 09/29/2021: New medication 09/15/21   metoprolol  succinate (TOPROL -XL) 25 MG 24 hr tablet Take 12.5 mg by mouth every morning.    montelukast  (SINGULAIR ) 10 MG tablet Take 10 mg by mouth every morning.    naloxone  (NARCAN ) nasal spray 4 mg/0.1 mL Place 0.4 mg into the nose daily as needed (opioid overdose).    rosuvastatin (CRESTOR) 10 MG tablet Take 10 mg by mouth at bedtime.    tirzepatide  (MOUNJARO ) 7.5 MG/0.5ML Pen Inject 7.5 mg into the skin once a week.    traMADol  (ULTRAM ) 50 MG tablet Take 1- 2  tablets three times a day as needed for pain.    umeclidinium-vilanterol (ANORO ELLIPTA ) 62.5-25 MCG/ACT AEPB 1 puff Inhalation Once a day    umeclidinium-vilanterol (ANORO ELLIPTA ) 62.5-25 MCG/INH AEPB Inhale 1 puff into the lungs daily. 02/25/2023: Filled 07/04/22 #180 today #108 LD 02/25/23    venlafaxine  XR (EFFEXOR -XR) 37.5 MG 24 hr capsule Take 1 tablet by mouth daily.    Vitamin D , Ergocalciferol , (DRISDOL ) 1.25 MG (50000 UNIT) CAPS capsule TAKE ONE CAPSULE (50,000 UNITS TOTAL) BY MOUTH EVERY OTHER  WEEK. THIS IS NEW DIRECTIONS    Facility-Administered Encounter Medications as of 12/16/2023  Medication   triamcinolone  acetonide (KENALOG ) 10 MG/ML injection 10 mg    Allergies:  Allergies  Allergen Reactions   Other Other (See Comments)   Salicylates    Aleve [Naproxen] Nausea Only and Other (See Comments)   Compazine  [Prochlorperazine ] Other (See Comments)    Numbness of face and  lips    Hydrocodone  Itching    High dose only   Oxycodone  Other (See Comments)    hallucinations    Penicillins Nausea Only and Other (See Comments)    Has patient had a PCN reaction causing immediate rash, facial/tongue/throat swelling, SOB or lightheadedness with hypotension: No Has patient had a PCN reaction causing severe rash involving mucus membranes or skin necrosis: No Has patient had a PCN reaction that required hospitalization No Has patient had a PCN reaction occurring within the last 10 years: No If all of the above answers are NO, then may proceed with Cephalosporin use.    Family History: Family History  Problem Relation Age of Onset   Hypertension Mother    Diabetes Mother    Hypertension Father    Diabetes Father    Hypertension Sister    Hyperlipidemia Brother    Breast cancer Paternal Aunt    Cancer Paternal Grandmother        unknown   Colon cancer Neg Hx    Parkinson's disease Neg Hx     Social History: Social History   Tobacco Use   Smoking status: Never   Smokeless tobacco: Never  Vaping Use   Vaping status: Never Used  Substance Use Topics   Alcohol use: No    Alcohol/week: 0.0 standard drinks of alcohol   Drug use: No   Social History   Social History Narrative   Are you right handed or left handed? Right Handed    Are you currently employed ? No    What is your current occupation?    Do you live at home alone? No    Who lives with you? Grandsons    What type of home do you live in: 1 story or 2 story? Lives in a one story home.         Vital  Signs:  BP 128/75   Pulse (!) 108   Ht 5' 2 (1.575 m)   Wt 277 lb (125.6 kg)   SpO2 96%   BMI 50.66 kg/m    Neurological Exam: MENTAL STATUS including orientation to time, place, person, recent and remote memory, attention span and concentration, language, and fund of knowledge is normal.  Speech is not dysarthric.  CRANIAL NERVES: II:  No visual field defects.     III-IV-VI: Pupils equal round and reactive to light.  Normal conjugate, extra-ocular eye movements in all directions of gaze.  No nystagmus.  No ptosis.   V:  Normal facial sensation.    VII:  Normal facial symmetry and movements.   VIII:  Normal hearing and vestibular function.   IX-X:  Normal palatal movement.   XI:  Normal shoulder shrug and head rotation.   XII:  Normal tongue strength and range of motion, no deviation or fasciculation.  MOTOR:  No atrophy, fasciculations or abnormal movements.  No pronator drift.   Upper Extremity:  Right  Left  Deltoid  5/5   5/5   Biceps  5/5   5/5   Triceps  5/5   5/5   Wrist extensors  5/5   5/5   Wrist flexors  5/5   5/5   Finger extensors  5/5   5/5   Finger flexors  5/5   5/5  Dorsal interossei  5/5   5/5   Abductor pollicis  5/5   5/5   Tone (Ashworth scale)  0  0   Lower Extremity:  Right  Left  Hip flexors  5/5   5/5   Knee flexors  5/5   5/5   Knee extensors  5/5   5/5   Dorsiflexors  5/5   5/5   Plantarflexors  5/5   5/5   Toe extensors  5/5   5/5   Toe flexors  5/5   5/5   Tone (Ashworth scale)  0  0   MSRs:                                           Right        Left brachioradialis 2+  2+  biceps 2+  2+  triceps 2+  2+  patellar 2+  2+  ankle jerk 0  0  Hoffman no  no  plantar response down  down   SENSORY:  Reduced pin prick in the feet bilaterally.  Hyperesthesia with vibration at the great toe.  Temperature intact throughout.  Romberg's sign absent.   COORDINATION/GAIT: Normal finger-to- nose-finger.  Intact rapid alternating movements  bilaterally.  Gait is wide-based, stable, unassisted.    Thank you for allowing me to participate in patient's care.  If I can answer any additional questions, I would be pleased to do so.    Sincerely,    Arsenio Schnorr K. Tobie, DO

## 2023-12-30 ENCOUNTER — Ambulatory Visit (INDEPENDENT_AMBULATORY_CARE_PROVIDER_SITE_OTHER): Admitting: Podiatry

## 2023-12-30 DIAGNOSIS — B351 Tinea unguium: Secondary | ICD-10-CM | POA: Diagnosis not present

## 2023-12-30 DIAGNOSIS — M79675 Pain in left toe(s): Secondary | ICD-10-CM | POA: Diagnosis not present

## 2023-12-30 DIAGNOSIS — M79674 Pain in right toe(s): Secondary | ICD-10-CM | POA: Diagnosis not present

## 2023-12-30 DIAGNOSIS — M65972 Unspecified synovitis and tenosynovitis, left ankle and foot: Secondary | ICD-10-CM

## 2023-12-30 DIAGNOSIS — E1149 Type 2 diabetes mellitus with other diabetic neurological complication: Secondary | ICD-10-CM

## 2023-12-30 DIAGNOSIS — M65971 Unspecified synovitis and tenosynovitis, right ankle and foot: Secondary | ICD-10-CM

## 2023-12-30 NOTE — Progress Notes (Unsigned)
 Subjective: Chief Complaint  Patient presents with   Capsulitis of right foot    Pt is here to have her nails trimmed down she stated that she is still having pain      63 year old female presents the office today with above concerns.  She states the nails got long so she trimming herself they do not need to be trimmed today.  She does not report any open lesions.    States that overall she is doing better from her foot but she still has pain she points the lateral aspect of the sinus tarsi where she gets discomfort.  No recent injuries or changes otherwise.  She did complete physical therapy but she still been doing aquatic therapy on her own.  Objective: AAO x3, NAD DP/PT pulses palpable bilaterally, CRT less than 3 seconds Sensation decreased with Semmes Weinstein monofilament. Nails are hypertrophic, dystrophic, brittle, discolored, elongated 10. No surrounding redness or drainage. Tenderness nails 1-5 bilaterally. No open lesions or pre-ulcerative lesions are identified today. Today the tenderness is localized along the sinus tarsi bilaterally.  There is localized edema to the area but there is no erythema or warmth.  There is no area of pinpoint tenderness.  Flexor, extensor tendons intact.  Decreased medial arch height. No pain with calf compression, swelling, warmth, erythema  Assessment: Symptomatic onychomycosis chronic bilateral foot pain, type 2 diabetes with neuropathy; capsulitis bilateral ankle  Plan: Capsulitis bilateral ankle - She was to proceed with steroid injections today.  I cleaned skin with Betadine, alcohol.  Mixture of 1 cc Kenalog  10, 0.5 cc of Marcaine  plain, 0.5 cc of lidocaine  plain was infiltrated into the sinus tarsi without complications bilaterally.  Tolerated well. - Continue with rehab exercises as well as supportive shoe gear.  Symptomatic onychomycosis - Sharply debrided nails x 10 without any complications or bleeding  Return in about 3 months  (around 03/31/2024).  Kelsey Wilson DPM

## 2023-12-30 NOTE — Patient Instructions (Signed)

## 2023-12-31 ENCOUNTER — Other Ambulatory Visit: Payer: Self-pay | Admitting: Physical Medicine and Rehabilitation

## 2023-12-31 DIAGNOSIS — M6281 Muscle weakness (generalized): Secondary | ICD-10-CM

## 2023-12-31 NOTE — Procedures (Signed)
Mask fit

## 2024-01-06 ENCOUNTER — Ambulatory Visit: Payer: Medicare Other | Admitting: Internal Medicine

## 2024-01-08 ENCOUNTER — Other Ambulatory Visit: Payer: Self-pay | Admitting: Physical Medicine and Rehabilitation

## 2024-01-22 ENCOUNTER — Telehealth: Payer: Self-pay

## 2024-01-22 ENCOUNTER — Other Ambulatory Visit (HOSPITAL_COMMUNITY): Payer: Self-pay

## 2024-01-22 NOTE — Telephone Encounter (Signed)
 Clinical info has been submitted

## 2024-01-22 NOTE — Telephone Encounter (Signed)
 Pharmacy Patient Advocate Encounter   Received notification from CoverMyMeds that prior authorization for Mounjaro  7.5MG /0.5ML auto-injectors is required/requested.   Insurance verification completed.   The patient is insured through [employer] .   Per test claim: PA required; PA started via CoverMyMeds. KEY [id number] . Waiting for clinical questions to populate.

## 2024-01-23 ENCOUNTER — Encounter: Payer: Self-pay | Admitting: Internal Medicine

## 2024-01-23 ENCOUNTER — Ambulatory Visit (INDEPENDENT_AMBULATORY_CARE_PROVIDER_SITE_OTHER): Admitting: Internal Medicine

## 2024-01-23 VITALS — BP 120/72 | HR 77 | Resp 20 | Ht 62.0 in | Wt 269.8 lb

## 2024-01-23 DIAGNOSIS — E785 Hyperlipidemia, unspecified: Secondary | ICD-10-CM | POA: Diagnosis not present

## 2024-01-23 DIAGNOSIS — E559 Vitamin D deficiency, unspecified: Secondary | ICD-10-CM

## 2024-01-23 DIAGNOSIS — E1142 Type 2 diabetes mellitus with diabetic polyneuropathy: Secondary | ICD-10-CM | POA: Diagnosis not present

## 2024-01-23 DIAGNOSIS — Z7985 Long-term (current) use of injectable non-insulin antidiabetic drugs: Secondary | ICD-10-CM | POA: Diagnosis not present

## 2024-01-23 DIAGNOSIS — E119 Type 2 diabetes mellitus without complications: Secondary | ICD-10-CM

## 2024-01-23 LAB — GLUCOSE, POCT (MANUAL RESULT ENTRY): POC Glucose: 9 mg/dL — AB (ref 70–99)

## 2024-01-23 LAB — POCT GLYCOSYLATED HEMOGLOBIN (HGB A1C): Hemoglobin A1C: 6 % — AB (ref 4.0–5.6)

## 2024-01-23 MED ORDER — TIRZEPATIDE 10 MG/0.5ML ~~LOC~~ SOAJ: 10.0000 mg | SUBCUTANEOUS | 3 refills | Status: AC

## 2024-01-23 NOTE — Patient Instructions (Addendum)
Increase Mounjaro 10 mg weekly   HOW TO TREAT LOW BLOOD SUGARS (Blood sugar LESS THAN 70 MG/DL) Please follow the RULE OF 15 for the treatment of hypoglycemia treatment (when your (blood sugars are less than 70 mg/dL)   STEP 1: Take 15 grams of carbohydrates when your blood sugar is low, which includes:  3-4 GLUCOSE TABS  OR 3-4 OZ OF JUICE OR REGULAR SODA OR ONE TUBE OF GLUCOSE GEL    STEP 2: RECHECK blood sugar in 15 MINUTES STEP 3: If your blood sugar is still low at the 15 minute recheck --> then, go back to STEP 1 and treat AGAIN with another 15 grams of carbohydrates.

## 2024-01-23 NOTE — Progress Notes (Signed)
 Name: Kelsey Wilson  MRN/ DOB: [id number], 1960/08/25   Age/ Sex: 63 y.o., female    PCP: Maree Leni Edyth DELENA, MD   Reason for Endocrinology Evaluation: Type 2 Diabetes Mellitus     Date of Initial Endocrinology Visit: 04/08/2023    PATIENT IDENTIFIER: [name] is a 63 y.o. female with a past medical history of DM, Hx Breast Ca  . The patient presented for initial endocrinology clinic visit on 04/08/2023  for consultative assistance with her diabetes management.    HPI: Kelsey Wilson was    Diagnosed with DM yrs ago  Prior Medications tried/Intolerance: Metformin - GI side effects. Trulicity - no intolerance  Hemoglobin A1c has ranged from 5.8% in 2024  On her initial visit to our clinic she had an A1c of 5.8%, she was on Mounjaro  and glipizide .  We discontinued glipizide  due to low A1c     SUBJECTIVE:   During the last visit (10/08/2023): A1c 6.0%      Today (04/08/2023): Kelsey Wilson is here for follow-up on diabetes management.  She checks blood sugars 0 times daily.    Patient follows with podiatry for capsulitis of the right foot She was evaluated by orthopedics for bilateral hand pain and postradiation neuropathy The patient did receive triamcinolone  injection in both hands in July, 2025  Hand pain is improving  Weight overall stable  Denies nausea or vomiting  Has noted constipation- uses stool softners as needed  NO vision changes  She continues to have feet discomfort  HOME DIABETES REGIMEN: Mounjaro  7.5 mg weekly  Ergocalciferol  50,000 units every other week Rosuvastatin 10 mg daily    Statin: yes ACE-I/ARB: yes    METER DOWNLOAD SUMMARY: n/a    DIABETIC COMPLICATIONS: Microvascular complications:  Neuropathy- chemo induced  Denies: CKD, retinopathy Last eye exam: scheduled 03/2024  Macrovascular complications:   Denies: CAD, PVD, CVA   PAST HISTORY: Past Medical History:  Past Medical History:  Diagnosis Date   Anemia     Anxiety    Arthritis    Breast cancer (HCC) 2013   T3N1 invasive ductal carcinoma left breast.Takes Arimidex  daily   Bursitis    Carpal tunnel syndrome    Chronic back pain    stenosis   Constipation    takes Colace daily   Depression    takes Benzotropine daily   Diverticulitis of colon    Dyspnea    daily when walking for over 1 yr.   Fibromyalgia 08/2012   GERD (gastroesophageal reflux disease)    takes Dexilant  daily   Hemorrhoid    History of blood transfusion    no abnormal reaction noted   History of colon polyps    benign   History of shingles    Joint pain    Joint swelling    Morbid obesity (HCC)    Night muscle spasms    takes Flexeril  nightly as needed   Nocturia    OSA (obstructive sleep apnea)    OSA on CPAP    Peripheral edema    takes Furosemide .Just started 01/18/16   Peripheral neuropathy    takes Lyrica  daily   Personal history of chemotherapy    2013   Personal history of radiation therapy    2013   Pneumonia    hx of-2015   Pseudoarthrosis of lumbar spine    Seasonal allergies    takes Singulair  nightly   SOB (shortness of breath) on exertion    rarely with exertion  Splenorenal shunt malfunction (HCC)    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 Marlana)   Type II diabetes mellitus (HCC)    Past Surgical History:  Past Surgical History:  Procedure Laterality Date   ABDOMINAL HYSTERECTOMY     still has ovaries   APPENDECTOMY     AXILLARY LYMPH NODE DISSECTION  11/28/2011   Procedure: AXILLARY LYMPH NODE DISSECTION;  Surgeon: Morene ONEIDA Olives, MD;  Location: [facility];  Service: General;  Laterality: Left;   BREAST BIOPSY Right 09/16/2023   MM RT BREAST BX W LOC DEV 1ST LESION IMAGE BX SPEC STEREO GUIDE 09/16/2023 [facility]   BREAST LUMPECTOMY Left 11/28/2011   Malignant   BREAST SURGERY Left 2013   CARPAL TUNNEL RELEASE     Bilateral   CESAREAN  SECTION     pt. has had 3   CHOLECYSTECTOMY     COLONOSCOPY     COLONOSCOPY WITH PROPOFOL  N/A 01/24/2021   Procedure: COLONOSCOPY WITH PROPOFOL ;  Surgeon: Legrand Victory LITTIE DOUGLAS, MD;  Location: [facility];  Service: Gastroenterology;  Laterality: N/A;   ESOPHAGOGASTRODUODENOSCOPY (EGD) WITH PROPOFOL  N/A 01/24/2021   Procedure: ESOPHAGOGASTRODUODENOSCOPY (EGD) WITH PROPOFOL ;  Surgeon: Legrand Victory LITTIE DOUGLAS, MD;  Location: [facility];  Service: Gastroenterology;  Laterality: N/A;   EYE SURGERY Bilateral    cataract removal   KNEE SURGERY     Left Knee   LAMINECTOMY WITH POSTERIOR LATERAL ARTHRODESIS LEVEL 1 N/A 11/29/2017   Procedure: Posterior Lateral Fusion - Lumbar Four-Lumbar Five, removal and replacement of hardware, Laminectomy - Lumbar Four-Lumbar Five;  Surgeon: Joshua Alm RAMAN, MD;  Location: [facility] OR;  Service: Neurosurgery;  Laterality: N/A;   LAMINECTOMY WITH POSTERIOR LATERAL ARTHRODESIS LEVEL 2 N/A 06/07/2017   Procedure: Posterior Lateral Fusion Lumbar Three-Four and Transforaminal Interbody Fusion Lumbar Four-Five with Segmental  Pedicle Screw Fixation;  Surgeon: Joshua Alm RAMAN, MD;  Location: [facility] OR;  Service: Neurosurgery;  Laterality: N/A;  Posterior Lateral Fusion Lumbar Three-Four and Transforaminal Interbody Fusion Lumbar Four-Five with Segmental  Pedicle Screw Fixation    LUMBAR FUSION  06/07/2017   POST   LUMBAR LAMINECTOMY/DECOMPRESSION MICRODISCECTOMY Bilateral 01/27/2016   Procedure: Laminectomy and Foraminotomy - Lumbar four -Lumbar five - bilateral- on-lay noninstrumented fusion;  Surgeon: Alm RAMAN Joshua, MD;  Location: [facility];  Service: Neurosurgery;  Laterality: Bilateral;   MULTIPLE EXTRACTIONS WITH ALVEOLOPLASTY N/A 05/11/2016   Procedure: EXTRACTION OF TEETH EIGHTEEN, TWENTY AND TWENTY- NINE;  REMOVAL OF MANDIBULAR TORUS AND EXOSTOSIS;  Surgeon: Glendia Primrose, DDS;  Location: [facility];  Service: Oral Surgery;  Laterality: N/A;   POLYPECTOMY  01/24/2021   Procedure:  POLYPECTOMY;  Surgeon: Legrand Victory LITTIE DOUGLAS, MD;  Location: [facility];  Service: Gastroenterology;;   PORTACATH PLACEMENT  06/18/2011   Procedure: INSERTION PORT-A-CATH;  Surgeon: Morene ONEIDA Olives, MD;  Location: [facility];  Service: General;  Laterality: Right;  right subclavian   removal portacath  2014   spur     Apex spur on both big toes   TOE SURGERY Bilateral    TONSILLECTOMY     TOTAL KNEE ARTHROPLASTY Left 09/13/2014   TOTAL KNEE ARTHROPLASTY Left 09/13/2014   Procedure: LEFT TOTAL KNEE ARTHROPLASTY;  Surgeon: Redell Shoals, MD;  Location: [facility];  Service: Orthopedics;  Laterality: Left;   TOTAL KNEE ARTHROPLASTY Right 03/10/2015   Procedure: RIGHT TOTAL KNEE ARTHROPLASTY;  Surgeon: Redell Shoals, MD;  Location: [facility];  Service: Orthopedics;  Laterality: Right;    Social History:  reports that she has never smoked. She has never used smokeless tobacco. She reports that she does not drink alcohol and does not use drugs. Family History:  Family History  Problem Relation Age of Onset   Hypertension Mother    Diabetes Mother    Hypertension Father    Diabetes Father    Hypertension Sister    Hyperlipidemia Brother    Breast cancer Paternal Aunt    Cancer Paternal Grandmother        unknown   Colon cancer Neg Hx    Parkinson's disease Neg Hx      HOME MEDICATIONS: Allergies as of 01/23/2024       Reactions   Other Other (See Comments)   Salicylates    Aleve [naproxen] Nausea Only, Other (See Comments)   Compazine  [prochlorperazine ] Other (See Comments)   Numbness of face and  lips   Hydrocodone  Itching   High dose only   Oxycodone  Other (See Comments)   hallucinations   Penicillins Nausea Only, Other (See Comments)   Has patient had a PCN reaction causing immediate rash, facial/tongue/throat swelling, SOB or lightheadedness with hypotension: No Has patient had a PCN reaction causing severe rash involving mucus membranes or skin necrosis: No Has  patient had a PCN reaction that required hospitalization No Has patient had a PCN reaction occurring within the last 10 years: No If all of the above answers are NO, then may proceed with Cephalosporin use.        Medication List        Accurate as of January 23, 2024 10:38 AM. If you have any questions, ask your nurse or doctor.          albuterol  108 (90 Base) MCG/ACT inhaler Commonly known as: VENTOLIN  HFA Inhale 2 puffs into the lungs every 6 (six) hours as needed for wheezing or shortness of breath.   ALPRAZolam  0.25 MG tablet Commonly known as: XANAX  Take 0.25 mg by mouth See admin instructions. Take one tablet (0.25 mg) by mouth twice daily - morning and mid-afternoon   Anoro Ellipta  62.5-25 MCG/ACT Aepb Generic drug: umeclidinium-vilanterol 1 puff Inhalation Once a day   Anoro Ellipta  62.5-25 MCG/INH Aepb Generic drug: umeclidinium-vilanterol Inhale 1 puff into the lungs daily.   atorvastatin 20 MG tablet Commonly known as: LIPITOR Take 20 mg by mouth daily.   bumetanide  2 MG tablet Commonly known as: BUMEX  Take 1 tablet every day by oral route.   clindamycin  300 MG capsule Commonly known as: CLEOCIN  Take 600 mg by mouth See admin instructions. Take two capsules (600 mg) by mouth one hour prior to dental procedures   cloNIDine 0.1 MG tablet Commonly known as: CATAPRES Take by mouth.   cyclobenzaprine  5 MG tablet Commonly known as: FLEXERIL  TAKE ONE TABLET BY MOUTH TWICE DAILY AS NEEDED FOR MUSCLE SPASMS. NOTE: MAXIMUM REFILLS REACHED   Dexlansoprazole  30 MG capsule DR Take 30 mg by mouth every morning.   dicyclomine  10 MG capsule Commonly known as: BENTYL    DULoxetine  60 MG capsule Commonly known as: CYMBALTA  Take 60 mg by mouth every morning.   ezetimibe  10 MG tablet Commonly known as: ZETIA    furosemide  20 MG tablet Commonly known as: LASIX  Take 20 mg by mouth every morning.   gabapentin  300 MG capsule Commonly known as:  NEURONTIN  Take 1 capsule (300 mg total) by mouth 3 (three) times daily. TAKE 1 CAPSULE BY MOUTH THREE TIMES DAILY AT 9AM, 1PM,AND  5PM   ipratropium 0.03 % nasal spray Commonly known as: ATROVENT    levocetirizine 5 MG tablet Commonly known as: XYZAL  Take 5 mg by mouth at bedtime.   lidocaine  5 % Commonly known as: Lidoderm  Place 1 patch onto the skin daily. Remove & Discard patch within 12 hours or as directed by MD   lisinopril  5 MG tablet Commonly known as: ZESTRIL  Take 5 mg by mouth every morning.   meloxicam  7.5 MG tablet Commonly known as: MOBIC    meloxicam  7.5 MG tablet Commonly known as: Mobic  Take 1 tablet (7.5 mg total) by mouth daily as needed for pain.   meloxicam  7.5 MG tablet Commonly known as: Mobic  Take 1 tablet (7.5 mg total) by mouth daily as needed for pain.   methocarbamol  500 MG tablet Commonly known as: ROBAXIN  Take 1,000 mg by mouth 3 (three) times daily.   metoprolol  succinate 25 MG 24 hr tablet Commonly known as: TOPROL -XL Take 12.5 mg by mouth every morning.   montelukast  10 MG tablet Commonly known as: SINGULAIR  Take 10 mg by mouth every morning.   Mounjaro  7.5 MG/0.5ML Pen Generic drug: tirzepatide  Inject 7.5 mg into the skin once a week.   naloxone  4 MG/0.1ML Liqd nasal spray kit Commonly known as: NARCAN  Place 0.4 mg into the nose daily as needed (opioid overdose).   rosuvastatin 10 MG tablet Commonly known as: CRESTOR Take 10 mg by mouth at bedtime.   traMADol  50 MG tablet Commonly known as: ULTRAM  Take 1- 2  tablets three times a day as needed for pain.   venlafaxine  XR 37.5 MG 24 hr capsule Commonly known as: EFFEXOR -XR Take 1 tablet by mouth daily.   Vitamin D  (Ergocalciferol ) 1.25 MG (50000 UNIT) Caps capsule Commonly known as: DRISDOL  TAKE ONE CAPSULE (50,000 UNITS TOTAL) BY MOUTH EVERY OTHER WEEK. THIS IS NEW DIRECTIONS         ALLERGIES: Allergies  Allergen Reactions   Other Other (See Comments)    Salicylates    Aleve [Naproxen] Nausea Only and Other (See Comments)   Compazine  [Prochlorperazine ] Other (See Comments)    Numbness of face and  lips    Hydrocodone  Itching    High dose only   Oxycodone  Other (See Comments)    hallucinations    Penicillins Nausea Only and Other (See Comments)    Has patient had a PCN reaction causing immediate rash, facial/tongue/throat swelling, SOB or lightheadedness with hypotension: No Has patient had a PCN reaction causing severe rash involving mucus membranes or skin necrosis: No Has patient had a PCN reaction that required hospitalization No Has patient had a PCN reaction occurring within the last 10 years: No If all of the above answers are NO, then may proceed with Cephalosporin use.     REVIEW OF SYSTEMS: A comprehensive ROS was conducted with the patient and is negative except as per HPI     OBJECTIVE:   VITAL SIGNS: BP 120/72   Pulse 77   Resp 20   Ht 5' 2 (1.575 m)   Wt 269 lb 12.8 oz (122.4 kg)   SpO2 99%   BMI 49.35 kg/m     Filed Weights   01/23/24 1036  Weight: 269 lb 12.8 oz (122.4 kg)     PHYSICAL EXAM:  General: Pt appears well and is in NAD  Lungs: Clear with good BS bilat   Heart: RRR   Extremities:  Lower extremities - trace pretibial edema.   Neuro: MS is good with appropriate affect,  pt is alert and Ox3    DM foot exam: 12/30/2023 per podiatry   The skin of the feet is intact without sores or ulcerations. The pedal pulses are 2+ on right and 2+ on left. The sensation is intact to a screening 5.07, 10 gram monofilament bilaterally   DATA REVIEWED:   Latest Reference Range & Units 10/08/23 14:24  Vitamin D , 25-Hydroxy 30 - 100 ng/mL 97       Latest Reference Range & Units 04/08/23 14:36  Sodium 135 - 145 mEq/L 138  Potassium 3.5 - 5.1 mEq/L 3.9  Chloride 96 - 112 mEq/L 99  CO2 19 - 32 mEq/L 31  Glucose 70 - 99 mg/dL 84  BUN 6 - 23 mg/dL 11  Creatinine 9.59 - 8.79 mg/dL 8.90  Calcium  8.4 - 89.4 mg/dL 9.5  GFR >39.99 mL/min 53.81 (L)  Total CHOL/HDL Ratio  3  Cholesterol 0 - 200 mg/dL 867  HDL Cholesterol >60.99 mg/dL 55.49  LDL (calc) 0 - 99 mg/dL 67  MICROALB/CREAT RATIO 0.0 - 30.0 mg/g 0.9  NonHDL  87.51  Triglycerides 0.0 - 149.0 mg/dL 896.9  VLDL 0.0 - 59.9 mg/dL 79.3  VITD 69.99 - 899.99 ng/mL 9.78 (L)  Vitamin B12 211 - 911 pg/mL 301    Latest Reference Range & Units 04/08/23 14:36  TSH 0.35 - 5.50 uIU/mL 1.68    Latest Reference Range & Units 04/08/23 14:36  Creatinine,U mg/dL 18.7  Microalb, Ur 0.0 - 1.9 mg/dL <9.2  MICROALB/CREAT RATIO 0.0 - 30.0 mg/g 0.9     Old records , labs and images have been reviewed.    In office BG 94 MGs/DL  ASSESSMENT / PLAN / RECOMMENDATIONS:   1) Type 2 Diabetes Mellitus, Optimally controlled, With neuropathic  complications - Most recent A1c of 6.0 %. Goal A1c < 7.0 %.      A1c remains optimal Intolerant to metformin  She has tried Trulicity  without intolerance issues She was on glipizide  but I discontinued it with an A1c of 5.9% We have opted to increase Mounjaro , to help with more weight loss as her weight has plateaued  MEDICATIONS: Increase Mounjaro  10 mg weekly    2) Diabetic complications:  Eye: Does not have known diabetic retinopathy.  Neuro/ Feet: Does  have known diabetic peripheral neuropathy. Renal: Patient does not have known baseline CKD. She is  on an ACEI/ARB at present.    3) Vitamin D  deficiency:  - This has normalized in the past, we opted to change the frequency as below  Medication Continue Ergocalciferol  50,000 units to every other week   4) Dyslipidemia :  -Lipid panel optimal - She will be due for a physical in 3 months   Medication Continue rosuvastatin 10 mg daily  5) Peripheral neuropathy:  - Patient with chronic tingling and numbness of fingers and toes, unclear if this is due to diabetes versus musculoskeletal - She is under the care of podiatry, awaiting on  diabetic shoes forms to be faxed    Follow-up in 6 months  Signed electronically by: Stefano Redgie Butts, MD  [facility] Endocrinology  [facility] [address] [place] [place], [place] [address] Phone: [phone] FAX: [phone]   CC: Maree Leni Edyth DELENA, MD [address] [place] [address] Phone: [phone]  Fax: [phone]    Return to Endocrinology clinic as below: Future Appointments  Date Time Provider Department Center  02/25/2024  2:40 PM Debby Fidela CROME, NP [facility] CPR  03/13/2024 11:00  AM [facility] DIAG TOM[facility] 1 [facility]  03/13/2024 11:10 AM [facility] US  1 [facility]  04/02/2024  3:45 PM Wagoner, Donnice SAUNDERS, DPM [facility]

## 2024-01-27 NOTE — Telephone Encounter (Signed)
 Pharmacy Patient Advocate Encounter  Received notification from [employer] that Prior Authorization for Mounjaro  7.5MG /0.5ML auto-injectors has been APPROVED from 01/22/24 to 06/03/2024

## 2024-02-06 ENCOUNTER — Other Ambulatory Visit: Payer: Self-pay | Admitting: Registered Nurse

## 2024-02-06 DIAGNOSIS — M6281 Muscle weakness (generalized): Secondary | ICD-10-CM

## 2024-02-18 ENCOUNTER — Telehealth: Payer: Self-pay

## 2024-02-18 NOTE — Telephone Encounter (Signed)
 Patient calling into the office requesting a refill on Tramadol .  Please advise if refill is deemed appropriate

## 2024-02-19 ENCOUNTER — Telehealth: Payer: Self-pay | Admitting: Registered Nurse

## 2024-02-19 DIAGNOSIS — G894 Chronic pain syndrome: Secondary | ICD-10-CM

## 2024-02-19 DIAGNOSIS — M47817 Spondylosis without myelopathy or radiculopathy, lumbosacral region: Secondary | ICD-10-CM

## 2024-02-19 DIAGNOSIS — G8929 Other chronic pain: Secondary | ICD-10-CM

## 2024-02-19 MED ORDER — TRAMADOL HCL 50 MG PO TABS: ORAL_TABLET | ORAL | 0 refills | Status: DC

## 2024-02-19 NOTE — Telephone Encounter (Signed)
 Call placed to [name],  She reports her Orthopeditis wanted this provider to increase her Gabapentin , [name] reports at times the Gabapentin  makes her drowsy, Gabapentin  will not be increased at this time. She also reports increase intensity and frequency with muscle spasm. Flexeril  increase to three times a day as needed. [name] has an appointment next week and we will re-assess the above, she verbalizes understanding.  Tramadol  e-scribed to pharmacy

## 2024-02-25 ENCOUNTER — Encounter: Attending: Registered Nurse | Admitting: Registered Nurse

## 2024-02-25 ENCOUNTER — Other Ambulatory Visit (HOSPITAL_BASED_OUTPATIENT_CLINIC_OR_DEPARTMENT_OTHER): Payer: Self-pay | Admitting: Family Medicine

## 2024-02-25 VITALS — BP 96/63 | HR 81 | Ht 62.0 in | Wt 262.0 lb

## 2024-02-25 DIAGNOSIS — M792 Neuralgia and neuritis, unspecified: Secondary | ICD-10-CM | POA: Insufficient documentation

## 2024-02-25 DIAGNOSIS — G5793 Unspecified mononeuropathy of bilateral lower limbs: Secondary | ICD-10-CM | POA: Insufficient documentation

## 2024-02-25 DIAGNOSIS — M25572 Pain in left ankle and joints of left foot: Secondary | ICD-10-CM | POA: Insufficient documentation

## 2024-02-25 DIAGNOSIS — G894 Chronic pain syndrome: Secondary | ICD-10-CM | POA: Diagnosis present

## 2024-02-25 DIAGNOSIS — M25571 Pain in right ankle and joints of right foot: Secondary | ICD-10-CM | POA: Diagnosis present

## 2024-02-25 DIAGNOSIS — M546 Pain in thoracic spine: Secondary | ICD-10-CM | POA: Diagnosis present

## 2024-02-25 DIAGNOSIS — M62838 Other muscle spasm: Secondary | ICD-10-CM | POA: Diagnosis present

## 2024-02-25 DIAGNOSIS — Z5181 Encounter for therapeutic drug level monitoring: Secondary | ICD-10-CM | POA: Diagnosis present

## 2024-02-25 DIAGNOSIS — G8929 Other chronic pain: Secondary | ICD-10-CM | POA: Insufficient documentation

## 2024-02-25 DIAGNOSIS — Z79891 Long term (current) use of opiate analgesic: Secondary | ICD-10-CM | POA: Diagnosis present

## 2024-02-25 DIAGNOSIS — M47817 Spondylosis without myelopathy or radiculopathy, lumbosacral region: Secondary | ICD-10-CM | POA: Insufficient documentation

## 2024-02-25 DIAGNOSIS — E2839 Other primary ovarian failure: Secondary | ICD-10-CM

## 2024-02-25 MED ORDER — TRAMADOL HCL 50 MG PO TABS: ORAL_TABLET | ORAL | 5 refills | Status: AC

## 2024-02-25 NOTE — Patient Instructions (Addendum)
 Take Flexeril  5 mg during the day and Flexeril  10 mg at bedtime.   Send a My Chart message in one week with update on Flexeril .   Make sure not to take Flexeril , Tramadol  and Gabapentin  together ,   Spread the above medications out and speak pharmacist as well.   Call your Primary Care physician regarding muscle spasms to check blood work   Call Dr Tonita Tobie [facility] to schedule F/U for your hand  Tremors, you seen her in July 2025

## 2024-02-25 NOTE — Progress Notes (Signed)
 Subjective:    Patient ID: Kelsey Wilson, female    DOB: 01-25-1961, 63 y.o.   MRN: [id number]  HPI: Kelsey Wilson is a 63 y.o. female who returns for follow up appointment for chronic pain and medication refill. She states her pain is located in her mid- lower back., bilateral lower extremities ( achy) and Bilateral finger tips and bilateral  feet with numbness and tingling. She rates her pain 8. Her current exercise regime is walking and performing stretching exercises.  Kelsey Wilson reports at times she has noticed tremors, no tremors noted at this time, she has a scheduled appointment with Neurology.  Also reports increase frequency of muscle spasms instructions given for Flexeril , she will send a update in two weeks, she verbalizes understanding.   Kelsey Wilson Morphine  equivalent is 60.00 MME.   Oral Swab was Performed today.    Pain Inventory Average Pain 8 Pain Right Now 8 My pain is stabbing and aching  In the last 24 hours, has pain interfered with the following? General activity 5 Relation with others 7 Enjoyment of life 7 What TIME of day is your pain at its worst? varies Sleep (in general) Fair  Pain is worse with: walking, bending, sitting, inactivity, standing, and some activites Pain improves with: rest, heat/ice, therapy/exercise, medication, and injections Relief from Meds: 5  Family History  Problem Relation Age of Onset   Hypertension Mother    Diabetes Mother    Hypertension Father    Diabetes Father    Hypertension Sister    Hyperlipidemia Brother    Breast cancer Paternal Aunt    Cancer Paternal Grandmother        unknown   Colon cancer Neg Hx    Parkinson's disease Neg Hx    Social History   Socioeconomic History   Marital status: Single    Spouse name: Not on file   Number of children: 3   Years of education: Not on file   Highest education level: Not on file  Occupational History   Not on file  Tobacco Use   Smoking status: Never    Smokeless tobacco: Never  Vaping Use   Vaping status: Never Used  Substance and Sexual Activity   Alcohol use: No    Alcohol/week: 0.0 standard drinks of alcohol   Drug use: No   Sexual activity: Never    Birth control/protection: Post-menopausal, Surgical  Other Topics Concern   Not on file  Social History Narrative   Are you right handed or left handed? Right Handed    Are you currently employed ? No    What is your current occupation?    Do you live at home alone? No    Who lives with you? Grandsons    What type of home do you live in: 1 story or 2 story? Lives in a one story home.        Social Drivers of Corporate investment banker Strain: Not on file  Food Insecurity: Not on file  Transportation Needs: Not on file  Physical Activity: Not on file  Stress: Not on file  Social Connections: Unknown (10/16/2021)   Received from [facility]   Social Network    Social Network: Not on file   Past Surgical History:  Procedure Laterality Date   ABDOMINAL HYSTERECTOMY     still has ovaries   APPENDECTOMY     AXILLARY LYMPH NODE DISSECTION  11/28/2011   Procedure: AXILLARY LYMPH NODE DISSECTION;  Surgeon: Morene ONEIDA Olives, MD;  Location[facility];  Service: General;  Laterality: Left;   BREAST BIOPSY Right 09/16/2023   MM RT BREAST BX W LOC DEV 1ST LESION IMAGE BX SPEC STEREO GUIDE 09/16/2023 [facility]   BREAST LUMPECTOMY Left 11/28/2011   Malignant   BREAST SURGERY Left 2013   CARPAL TUNNEL RELEASE     Bilateral   CESAREAN SECTION     pt. has had 3   CHOLECYSTECTOMY     COLONOSCOPY     COLONOSCOPY WITH PROPOFOL  N/A 01/24/2021   Procedure: COLONOSCOPY WITH PROPOFOL ;  Surgeon: Legrand Victory LITTIE DOUGLAS, MD;  Location: [facility];  Service: Gastroenterology;  Laterality: N/A;   ESOPHAGOGASTRODUODENOSCOPY (EGD) WITH PROPOFOL  N/A 01/24/2021   Procedure: ESOPHAGOGASTRODUODENOSCOPY (EGD) WITH PROPOFOL ;  Surgeon: Legrand Victory LITTIE DOUGLAS, MD;  Location: [facility];  Service:  Gastroenterology;  Laterality: N/A;   EYE SURGERY Bilateral    cataract removal   KNEE SURGERY     Left Knee   LAMINECTOMY WITH POSTERIOR LATERAL ARTHRODESIS LEVEL 1 N/A 11/29/2017   Procedure: Posterior Lateral Fusion - Lumbar Four-Lumbar Five, removal and replacement of hardware, Laminectomy - Lumbar Four-Lumbar Five;  Surgeon: Joshua Alm RAMAN, MD;  Location: [facility] OR;  Service: Neurosurgery;  Laterality: N/A;   LAMINECTOMY WITH POSTERIOR LATERAL ARTHRODESIS LEVEL 2 N/A 06/07/2017   Procedure: Posterior Lateral Fusion Lumbar Three-Four and Transforaminal Interbody Fusion Lumbar Four-Five with Segmental  Pedicle Screw Fixation;  Surgeon: Joshua Alm RAMAN, MD;  Location: [facility] OR;  Service: Neurosurgery;  Laterality: N/A;  Posterior Lateral Fusion Lumbar Three-Four and Transforaminal Interbody Fusion Lumbar Four-Five with Segmental  Pedicle Screw Fixation    LUMBAR FUSION  06/07/2017   POST   LUMBAR LAMINECTOMY/DECOMPRESSION MICRODISCECTOMY Bilateral 01/27/2016   Procedure: Laminectomy and Foraminotomy - Lumbar four -Lumbar five - bilateral- on-lay noninstrumented fusion;  Surgeon: Alm RAMAN Joshua, MD;  Location: [facility];  Service: Neurosurgery;  Laterality: Bilateral;   MULTIPLE EXTRACTIONS WITH ALVEOLOPLASTY N/A 05/11/2016   Procedure: EXTRACTION OF TEETH EIGHTEEN, TWENTY AND TWENTY- NINE;  REMOVAL OF MANDIBULAR TORUS AND EXOSTOSIS;  Surgeon: Glendia Primrose, DDS;  Location: [facility] OR;  Service: Oral Surgery;  Laterality: N/A;   POLYPECTOMY  01/24/2021   Procedure: POLYPECTOMY;  Surgeon: Legrand Victory LITTIE DOUGLAS, MD;  Location: [facility];  Service: Gastroenterology;;   PORTACATH PLACEMENT  06/18/2011   Procedure: INSERTION PORT-A-CATH;  Surgeon: Morene ONEIDA Olives, MD;  Location: [facility];  Service: General;  Laterality: Right;  right subclavian   removal portacath  2014   spur     Apex spur on both big toes   TOE SURGERY Bilateral    TONSILLECTOMY     TOTAL KNEE ARTHROPLASTY Left 09/13/2014    TOTAL KNEE ARTHROPLASTY Left 09/13/2014   Procedure: LEFT TOTAL KNEE ARTHROPLASTY;  Surgeon: Redell Shoals, MD;  Location: [facility];  Service: Orthopedics;  Laterality: Left;   TOTAL KNEE ARTHROPLASTY Right 03/10/2015   Procedure: RIGHT TOTAL KNEE ARTHROPLASTY;  Surgeon: Redell Shoals, MD;  Location: [facility];  Service: Orthopedics;  Laterality: Right;   Past Surgical History:  Procedure Laterality Date   ABDOMINAL HYSTERECTOMY     still has ovaries   APPENDECTOMY     AXILLARY LYMPH NODE DISSECTION  11/28/2011   Procedure: AXILLARY LYMPH NODE DISSECTION;  Surgeon: Morene ONEIDA Olives, MD;  Location: [facility];  Service: General;  Laterality: Left;   BREAST BIOPSY Right 09/16/2023   MM RT BREAST BX W LOC DEV 1ST LESION IMAGE BX  SPEC STEREO GUIDE 09/16/2023 [facility]   BREAST LUMPECTOMY Left 11/28/2011   Malignant   BREAST SURGERY Left 2013   CARPAL TUNNEL RELEASE     Bilateral   CESAREAN SECTION     pt. has had 3   CHOLECYSTECTOMY     COLONOSCOPY     COLONOSCOPY WITH PROPOFOL  N/A 01/24/2021   Procedure: COLONOSCOPY WITH PROPOFOL ;  Surgeon: Legrand Victory LITTIE DOUGLAS, MD;  Location: [facility];  Service: Gastroenterology;  Laterality: N/A;   ESOPHAGOGASTRODUODENOSCOPY (EGD) WITH PROPOFOL  N/A 01/24/2021   Procedure: ESOPHAGOGASTRODUODENOSCOPY (EGD) WITH PROPOFOL ;  Surgeon: Legrand Victory LITTIE DOUGLAS, MD;  Location: [facility];  Service: Gastroenterology;  Laterality: N/A;   EYE SURGERY Bilateral    cataract removal   KNEE SURGERY     Left Knee   LAMINECTOMY WITH POSTERIOR LATERAL ARTHRODESIS LEVEL 1 N/A 11/29/2017   Procedure: Posterior Lateral Fusion - Lumbar Four-Lumbar Five, removal and replacement of hardware, Laminectomy - Lumbar Four-Lumbar Five;  Surgeon: Joshua Alm RAMAN, MD;  Location: [facility] OR;  Service: Neurosurgery;  Laterality: N/A;   LAMINECTOMY WITH POSTERIOR LATERAL ARTHRODESIS LEVEL 2 N/A 06/07/2017   Procedure: Posterior Lateral Fusion Lumbar Three-Four and Transforaminal Interbody Fusion  Lumbar Four-Five with Segmental  Pedicle Screw Fixation;  Surgeon: Joshua Alm RAMAN, MD;  Location: [facility] OR;  Service: Neurosurgery;  Laterality: N/A;  Posterior Lateral Fusion Lumbar Three-Four and Transforaminal Interbody Fusion Lumbar Four-Five with Segmental  Pedicle Screw Fixation    LUMBAR FUSION  06/07/2017   POST   LUMBAR LAMINECTOMY/DECOMPRESSION MICRODISCECTOMY Bilateral 01/27/2016   Procedure: Laminectomy and Foraminotomy - Lumbar four -Lumbar five - bilateral- on-lay noninstrumented fusion;  Surgeon: Alm RAMAN Joshua, MD;  Location: [facility];  Service: Neurosurgery;  Laterality: Bilateral;   MULTIPLE EXTRACTIONS WITH ALVEOLOPLASTY N/A 05/11/2016   Procedure: EXTRACTION OF TEETH EIGHTEEN, TWENTY AND TWENTY- NINE;  REMOVAL OF MANDIBULAR TORUS AND EXOSTOSIS;  Surgeon: Glendia Primrose, DDS;  Location: [facility];  Service: Oral Surgery;  Laterality: N/A;   POLYPECTOMY  01/24/2021   Procedure: POLYPECTOMY;  Surgeon: Legrand Victory LITTIE DOUGLAS, MD;  Location: [facility];  Service: Gastroenterology;;   PORTACATH PLACEMENT  06/18/2011   Procedure: INSERTION PORT-A-CATH;  Surgeon: Morene ONEIDA Olives, MD;  Location: [facility];  Service: General;  Laterality: Right;  right subclavian   removal portacath  2014   spur     Apex spur on both big toes   TOE SURGERY Bilateral    TONSILLECTOMY     TOTAL KNEE ARTHROPLASTY Left 09/13/2014   TOTAL KNEE ARTHROPLASTY Left 09/13/2014   Procedure: LEFT TOTAL KNEE ARTHROPLASTY;  Surgeon: Redell Shoals, MD;  Location: [facility];  Service: Orthopedics;  Laterality: Left;   TOTAL KNEE ARTHROPLASTY Right 03/10/2015   Procedure: RIGHT TOTAL KNEE ARTHROPLASTY;  Surgeon: Redell Shoals, MD;  Location: [facility];  Service: Orthopedics;  Laterality: Right;   Past Medical History:  Diagnosis Date   Anemia    Anxiety    Arthritis    Breast cancer (HCC) 2013   T3N1 invasive ductal carcinoma left breast.Takes Arimidex  daily   Bursitis    Carpal tunnel syndrome    Chronic  back pain    stenosis   Constipation    takes Colace daily   Depression    takes Benzotropine daily   Diverticulitis of colon    Dyspnea    daily when walking for over 1 yr.   Fibromyalgia 08/2012   GERD (gastroesophageal reflux disease)    takes Dexilant  daily  Hemorrhoid    History of blood transfusion    no abnormal reaction noted   History of colon polyps    benign   History of shingles    Joint pain    Joint swelling    Morbid obesity (HCC)    Night muscle spasms    takes Flexeril  nightly as needed   Nocturia    OSA (obstructive sleep apnea)    OSA on CPAP    Peripheral edema    takes Furosemide .Just started 01/18/16   Peripheral neuropathy    takes Lyrica  daily   Personal history of chemotherapy    2013   Personal history of radiation therapy    2013   Pneumonia    hx of-2015   Pseudoarthrosis of lumbar spine    Seasonal allergies    takes Singulair  nightly   SOB (shortness of breath) on exertion    rarely with exertion   Splenorenal shunt malfunction    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 Marlana)   Type II diabetes mellitus (HCC)    BP 96/63   Pulse 81   Ht 5' 2 (1.575 m)   Wt 262 lb (118.8 kg)   SpO2 98%   BMI 47.92 kg/m   Opioid Risk Score:   Fall Risk Score:  `1  Depression screen [facility] 2/9     02/25/2023    9:53 AM 08/08/2022   10:48 AM 08/04/2018   10:45 AM 06/06/2018    9:30 AM 04/04/2018   10:03 AM 01/13/2018   10:08 AM 12/13/2017    9:46 AM  Depression screen PHQ 2/9  Decreased Interest 0 0 0 0 0 3 3  Down, Depressed, Hopeless 0 0 0 0 0 3 3  PHQ - 2 Score 0 0 0 0 0 6 6      Review of Systems  Musculoskeletal:  Positive for back pain.       B/L upper leg pain B/L foot pain B/L calf pain  All other systems reviewed and are negative.      Objective:   Physical Exam Vitals and nursing note reviewed.  Constitutional:      Appearance: Normal  appearance. She is obese.  Cardiovascular:     Rate and Rhythm: Normal rate and regular rhythm.     Pulses: Normal pulses.     Heart sounds: Normal heart sounds.  Pulmonary:     Effort: Pulmonary effort is normal.     Breath sounds: Normal breath sounds.  Musculoskeletal:     Comments: Normal Muscle Bulk and Muscle Testing Reveals:  Upper Extremities: Full ROM and Muscle Strength 5/5  Thoracic and Lumbar Hypersensitivity  Lower Extremities: Full ROM and Muscle Strength 5/5 Arises from Table slowly Antalgic  Gait     Skin:    General: Skin is warm and dry.  Neurological:     Mental Status: She is alert and oriented to person, place, and time.  Psychiatric:        Mood and Affect: Mood normal.        Behavior: Behavior normal.           Assessment & Plan:  1. Chronic Sacroiliac Joint Pain: S/P Bilateral Sacroilliac Injection with Dr. Carilyn on 06/23/2019. With good relief noted. 02/25/2024. 2.. Lumbar Postlaminectomy/ S/P Lumbar Fusion:Spinal Stenosis:S/P Posterior Lateral Fusion L-4-L-5 removal and replacement of hardware, Laminectomy L4-L5 by Dr. Joshua on 05/22/2018. Continue HEP as Tolerated and Continue  current medication regimen. 02/25/2024   Continue:  Tramadol  (50 mg) Take one- two tablets three times a day as needed for pain #180   We will continue the opioid monitoring program, this consists of regular clinic visits, examinations, urine drug screen, pill counts as well as use of [place]  Controlled Substance Reporting system. A 12 month History has been reviewed on the [place]  Controlled Substance Reporting System on 02/25/2024. 3. Lumbar Radiculitis: No complaints today. Continue current medication regime with Gabapentin . 02/25/2024 4. Fibromyalgia: Continue HEP as Tolerated. Continue Current Medication Regimen with Gabapentin . 02/25/2024 5. Bilateral Greater Trochanter Bursitis: Continue to Alternate Ice and Heat Therapy.Continue to monitor.   09/232025. 6. Muscle Spasm: Continue: Flexeril .instructions given, she will send a update in two weeks via My-Chart.  Continue to Monitor. 02/25/2024. 7. Chronic Pain Syndrome: Continue Current medication regimen and HEP as Tolerated. Continue to Monitor. 02/25/2024.  8. Bilateral OA of Bilateral Knees: Orthopedist following.  Continue to Monitor. 02/25/2024 9. Bilateral Hand Pain: No complaints today. Continue to Monitor/ ? OA: Continue to Monitor. 02/25/2024 10. Cervicalgia: No complaints Today. Continue to alternate Heat/Ice Therapy. Continue HEP as Tolerated. 02/25/2024. 11. Chronic Bilateral Shoulders: No complaints today. Continue to alternate Ice/ Heat therapy and HEP as Tolerated. 02/25/2024.  12. Bilateral Ankle Pain: Podiatry Following: Continue to Monitor. 02/25/2024   13. Bilateral Fingers and Bilateral Feet  with Neuropathic Pain: Contin Gabapentin . Educated on following diabetic diet and tight blood sugar control, she verbalizes understanding. 02/25/2024 14. Fall, subsequent encounter: No Falls this month. Educated on [employer], she verbalizes understanding. Continue to Monitor.  15. Reports Tremors: She has a scheduled appointment with Neurology. Continue to Monitor.     F/U in 6 months

## 2024-02-28 LAB — DRUG TOX MONITOR 1 W/CONF, ORAL FLD
Amphetamines: NEGATIVE ng/mL (ref <10)
Barbiturates: NEGATIVE ng/mL (ref <10)
Benzodiazepines: NEGATIVE ng/mL (ref <0.50)
Buprenorphine: NEGATIVE ng/mL (ref <0.10)
Cocaine: NEGATIVE ng/mL (ref <5.0)
Fentanyl: NEGATIVE ng/mL (ref <0.10)
Heroin Metabolite: NEGATIVE ng/mL (ref <1.0)
MARIJUANA: NEGATIVE ng/mL (ref <2.5)
MDMA: NEGATIVE ng/mL (ref <10)
Meprobamate: NEGATIVE ng/mL (ref <2.5)
Methadone: NEGATIVE ng/mL (ref <5.0)
Nicotine Metabolite: NEGATIVE ng/mL (ref <5.0)
Opiates: NEGATIVE ng/mL (ref <2.5)
Phencyclidine: NEGATIVE ng/mL (ref <10)
Tapentadol: NEGATIVE ng/mL (ref <5.0)
Tramadol: 333.9 ng/mL — ABNORMAL HIGH (ref <5.0)
Tramadol: POSITIVE ng/mL — AB (ref <5.0)
Zolpidem: NEGATIVE ng/mL (ref <5.0)

## 2024-02-28 LAB — DRUG TOX ALC METAB W/CON, ORAL FLD: Alcohol Metabolite: NEGATIVE ng/mL (ref <25)

## 2024-03-04 ENCOUNTER — Encounter: Payer: Self-pay | Admitting: Registered Nurse

## 2024-03-12 ENCOUNTER — Other Ambulatory Visit: Payer: Self-pay | Admitting: Adult Health

## 2024-03-12 DIAGNOSIS — N6489 Other specified disorders of breast: Secondary | ICD-10-CM

## 2024-03-12 DIAGNOSIS — Z1231 Encounter for screening mammogram for malignant neoplasm of breast: Secondary | ICD-10-CM

## 2024-03-13 ENCOUNTER — Ambulatory Visit
Admission: RE | Admit: 2024-03-13 | Discharge: 2024-03-13 | Disposition: A | Discharge status: Home / Self Care | Source: Ambulatory Visit | Attending: Family Medicine | Admitting: Family Medicine

## 2024-03-13 ENCOUNTER — Ambulatory Visit

## 2024-03-13 DIAGNOSIS — N6489 Other specified disorders of breast: Secondary | ICD-10-CM

## 2024-03-16 ENCOUNTER — Ambulatory Visit (INDEPENDENT_AMBULATORY_CARE_PROVIDER_SITE_OTHER): Admitting: Neurology

## 2024-03-16 VITALS — BP 112/78 | HR 98 | Ht 62.0 in | Wt 268.0 lb

## 2024-03-16 DIAGNOSIS — T451X5A Adverse effect of antineoplastic and immunosuppressive drugs, initial encounter: Secondary | ICD-10-CM | POA: Diagnosis not present

## 2024-03-16 DIAGNOSIS — G620 Drug-induced polyneuropathy: Secondary | ICD-10-CM | POA: Diagnosis not present

## 2024-03-16 DIAGNOSIS — R259 Unspecified abnormal involuntary movements: Secondary | ICD-10-CM

## 2024-03-16 NOTE — Patient Instructions (Signed)
 Recommend that you follow-up with Dr. True Mar, movement disorder specialist, at [facility] [address], [place], [place] [address] Phone: [phone]

## 2024-03-16 NOTE — Progress Notes (Unsigned)
 HPI F never smoker followed for OSA,, Asthma, allergic rhinitis,  lung nodules, complicated by DM2, hx Breast cancer L in remission, HBP HST 01/30/2019- AHI 23.7/ hr, desaturation to 71%, body weight 293 lbs NPSG 07/23/2016- AHI 116.6/ hr, desaturation to 72% HST 01/09/23- AHI 15.3/hr, desat to 82%, body weight 268 lbs  ------------------------------------------------------------------------------------  07/08/23- 62 yoF never smoker followed for  Asthma, lung nodules, OSA, complicated by DM2, hx Breast cancer L in remission, HBP, GERD, Tremor/ movement disorder/ Neurology, Prurigo Nodularis,  -Anoro, Singulair , albuterol  hfa Body weight today-   265 lbs CPAP auto 5-20/ [employer]   new order 05/01/23 Download compliance- 50%, AHI 8.7/hr Compliance goals emphasized. Discussed the use of AI scribe software for clinical note transcription with the patient, who gave verbal consent to proceed.  History of Present Illness   The patient, with a history of obstructive sleep apnea (OSA) managed with CPAP, presents with daytime sleepiness. She reports feeling tired every day around the same time due to early morning wake times. She avoids napping and consumes caffeine, lightly,  in the form of coffee and soda. She has been using a CPAP machine, which she initially disliked but has grown accustomed to. She uses a 'mustache mask' style after finding the nasal mask irritating. However, she reports some nights where she does not use the CPAP for at least four hours due to discomfort.  In addition to OSA, the patient has a history of nodules in the lungs, which were stable at last check in August. She also reports breathlessness with exertion, such as walking too far or climbing stairs. This is unchanged and consistent with her level of fitness. She is currently taking Alprazolam  to aid with sleep.     Assessment and Plan:    Obstructive Sleep Apnea Patient is using CPAP with a mustache mask and chin strap. However,  there are still 8-9 breakthrough apneas per hour, and patient is not consistently using the machine for at least 4 hours per night. Discussed the possibility of switching to BiPAP in the future for better control. -Encourage consistent use of CPAP for at least 4 hours per night. -Consider short naps and over-the-counter caffeine tablets to manage daytime sleepiness. -Potential future discussion about switching to BiPAP for better control.  Daytime Sleepiness Patient experiences daytime sleepiness, which may be related to inconsistent CPAP use and early morning awakenings. Patient is trying to avoid napping and is consuming caffeine. -Encourage short naps and use of over-the-counter caffeine tablets to manage daytime sleepiness. -Ensure consistent use of CPAP for at least 4 hours per night.  Anxiety/Insomnia Patient is taking Alprazolam  (Xanax ) to help with sleep. -Continue Alprazolam  as prescribed.  Pulmonary Nodules Stable on last x-ray in August. -Order a repeat x-ray to monitor for any changes in the nodules.     03/17/24-  63 yoF never smoker followed for  Asthma, lung nodules, OSA, complicated by DM2, hx Breast cancer L in remission, HBP, GERD, Tremor/ movement disorder/ Neurology, Prurigo Nodularis,  -Anoro, Singulair , albuterol  hfa Body weight today-  266 lbs CPAP auto 5-20/ [employer]   new order 05/01/23 Download compliance- not using, disliked  irritation in nostril from masks. Discussed importance of CPAP and suggested trial saline gel. Admits she dozes off easily if sitting. Night time sleep fragmented- no sleep med. She agrees to retry her CPAP. She denies any asthma or need to use her asthma meds. We will watch as seasons change. CXR 23/25 IMPRESSION: No active cardiopulmonary disease.   ROS-see  HPI   + = positive Constitutional:    weight loss, night sweats, fevers, chills, fatigue, lassitude. HEENT:    headaches, +difficulty swallowing, +tooth/dental problems, sore throat,        +sneezing,+ itching,+ ear ache, nasal congestion, post nasal drip, snoring CV:    chest pain, orthopnea, PND, +swelling in lower extremities, anasarca,                                   dizziness, palpitations Resp:   +shortness of breath with exertion or at rest.                productive cough,   non-productive cough, coughing up of blood.              change in color of mucus.  wheezing.   Skin:    rash or lesions. GI:  No-   heartburn, indigestion, +abdominal pain, nausea, vomiting, diarrhea,                 change in bowel habits, loss of appetite GU: dysuria, change in color of urine, no urgency or frequency.   flank pain. MS:   +joint pain, stiffness, decreased range of motion, back pain. Neuro-     nothing unusual Psych:  change in mood or affect.  depression or +anxiety.   memory loss.  OBJ- Physical Exam General- Alert, Oriented, Affect-appropriate, Distress- none acute, + morbidly obese Skin- rash-none, lesions- none, excoriation- none Lymphadenopathy- none Head- atraumatic            Eyes- Gross vision intact, PERRLA, conjunctivae and secretions clear            Ears- Hearing, canals-normal            Nose- Clear, no-Septal dev, mucus, polyps, erosion, perforation             Throat- Mallampati III , mucosa clear , drainage- none, tonsils- atrophic, + teeth Neck- flexible , trachea midline, no stridor , thyroid  nl, carotid no bruit Chest - symmetrical excursion , unlabored           Heart/CV- RRR , no murmur , no gallop  , no rub, nl s1 s2                           - JVD- none , edema- none, stasis changes- none, varices- none           Lung- clear to P&A, wheeze- none, cough- none , dullness-none, rub- none           Chest wall- +Scar from port access Abd-  Br/ Gen/ Rectal- Not done, not indicated Extrem- cyanosis- none, clubbing, none, atrophy- none, strength- nl, + cane Neuro- unremarkable grossly

## 2024-03-16 NOTE — Progress Notes (Signed)
 Follow-up Visit   Date: 03/16/2024    Kelsey Wilson MRN: [id number] DOB: [birth year]    Kelsey Wilson is a 63 y.o. right-handed female with well-controlled diabetes mellitus, history of breast cancer, GERD, fibromyalgia, hypertension, chronic pain returning to the clinic with new complaints of abnormal facial movements.  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   The patient was accompanied to the clinic by self.  IMPRESSION/PLAN: Abnormal involuntary movements, ?orolingual dyskinesia.  She has seen movement specialist, Dr. True Mar, and I recommend that she follow-up with her.  I explained that I am a neuromuscular specialist and do not have any expertise with her condition.  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    --------------------------------------------- History of present illness: December 16, 2023:  She had breast cancer in 2014 and reports having numbness/tingling involving the hands and feet since this time.  Her symptoms did not improve after she completed chemotherapy.  She also has diffuse whole body pain and sees pain management. She takes Cymbalta  60mg  and gabapentin  300mg  TID.  She has diabetes which is well-controlled, last HbA1c 6.0.  She has some imbalance, no recent falls.  No weakness.     Nonsmoker and does not drink alcohol.   UPDATE 03/16/2024:  She reports having uncontrollable movement of the tongue for several years.  She saw Dr. True Mar for this in 2023.  She reports being treated with Ingrezza  in the past which helped for a little while, but then stopped working.  She is not taking any medication for this currently.  She has history of being on psychotrophic medications in the past.  Detailed note of Dr. Mar on 12/26/2021 was reviewed.  Medications:  Current Outpatient Medications on File Prior to Visit  Medication Sig Dispense Refill   albuterol  (VENTOLIN  HFA) 108 (90 Base) MCG/ACT inhaler Inhale 2 puffs  into the lungs every 6 (six) hours as needed for wheezing or shortness of breath. 8 g 12   ALPRAZolam  (XANAX ) 0.25 MG tablet Take 0.25 mg by mouth See admin instructions. Take one tablet (0.25 mg) by mouth twice daily - morning and mid-afternoon     atorvastatin (LIPITOR) 20 MG tablet Take 20 mg by mouth daily.     bumetanide  (BUMEX ) 2 MG tablet Take 1 tablet every day by oral route.     clindamycin  (CLEOCIN ) 300 MG capsule Take 600 mg by mouth See admin instructions. Take two capsules (600 mg) by mouth one hour prior to dental procedures     cloNIDine (CATAPRES) 0.1 MG tablet Take by mouth.     cyclobenzaprine  (FLEXERIL ) 5 MG tablet TAKE ONE TABLET BY MOUTH TWICE DAILY AS NEEDED FOR MUSCLE SPASMS NOTE: MAXIMUM REFILLS REACHED 60 tablet 3   Dexlansoprazole  30 MG capsule DR Take 30 mg by mouth every morning.     dicyclomine  (BENTYL ) 10 MG capsule      DULoxetine  (CYMBALTA ) 60 MG capsule Take 60 mg by mouth every morning.     ezetimibe  (ZETIA ) 10 MG tablet      furosemide  (LASIX ) 20 MG tablet Take 20 mg by mouth every morning.     gabapentin  (NEURONTIN ) 300 MG capsule Take 1 capsule (300 mg total) by mouth 3 (three) times daily. TAKE 1 CAPSULE BY MOUTH THREE TIMES DAILY AT 9AM, 1PM,AND 5PM 90 capsule 3   ipratropium (ATROVENT ) 0.03 % nasal spray      levocetirizine (XYZAL ) 5 MG tablet Take 5 mg by mouth at bedtime.  lisinopril  (ZESTRIL ) 5 MG tablet Take 5 mg by mouth every morning.     metoprolol  succinate (TOPROL -XL) 25 MG 24 hr tablet Take 12.5 mg by mouth every morning.     montelukast  (SINGULAIR ) 10 MG tablet Take 10 mg by mouth every morning.     naloxone  (NARCAN ) nasal spray 4 mg/0.1 mL Place 0.4 mg into the nose daily as needed (opioid overdose).     rosuvastatin (CRESTOR) 10 MG tablet Take 10 mg by mouth at bedtime.     tirzepatide  (MOUNJARO ) 10 MG/0.5ML Pen Inject 10 mg into the skin once a week. 6 mL 3   traMADol  (ULTRAM ) 50 MG tablet Take 1- 2  tablets three times a day as needed for  pain. 180 tablet 5   umeclidinium-vilanterol (ANORO ELLIPTA ) 62.5-25 MCG/ACT AEPB 1 puff Inhalation Once a day     umeclidinium-vilanterol (ANORO ELLIPTA ) 62.5-25 MCG/INH AEPB Inhale 1 puff into the lungs daily. 1 each 12   venlafaxine  XR (EFFEXOR -XR) 37.5 MG 24 hr capsule Take 1 tablet by mouth daily.     Vitamin D , Ergocalciferol , (DRISDOL ) 1.25 MG (50000 UNIT) CAPS capsule TAKE ONE CAPSULE (50,000 UNITS TOTAL) BY MOUTH EVERY OTHER WEEK. THIS IS NEW DIRECTIONS 6 capsule 1   methocarbamol  (ROBAXIN ) 500 MG tablet Take 1,000 mg by mouth 3 (three) times daily. (Patient not taking: Reported on 03/16/2024)     Current Facility-Administered Medications on File Prior to Visit  Medication Dose Route Frequency Provider Last Rate Last Admin   triamcinolone  acetonide (KENALOG ) 10 MG/ML injection 10 mg  10 mg Other Once Stover, Titorya, DPM        Allergies:  Allergies  Allergen Reactions   Other Other (See Comments)   Salicylates    Aleve [Naproxen] Nausea Only and Other (See Comments)   Compazine  [Prochlorperazine ] Other (See Comments)    Numbness of face and  lips    Hydrocodone  Itching    High dose only   Oxycodone  Other (See Comments)    hallucinations    Penicillins Nausea Only and Other (See Comments)    Has patient had a PCN reaction causing immediate rash, facial/tongue/throat swelling, SOB or lightheadedness with hypotension: No Has patient had a PCN reaction causing severe rash involving mucus membranes or skin necrosis: No Has patient had a PCN reaction that required hospitalization No Has patient had a PCN reaction occurring within the last 10 years: No If all of the above answers are NO, then may proceed with Cephalosporin use.    Vital Signs:  BP 112/78   Pulse 98   Ht 5' 2 (1.575 m)   Wt 268 lb (121.6 kg)   SpO2 92%   BMI 49.02 kg/m    Neurological Exam: MENTAL STATUS including orientation to time, place, person, recent and remote memory, attention span and  concentration, language, and fund of knowledge is normal.  Speech is not dysarthric.  CRANIAL NERVES:  No visual field defects.  Pupils equal round and reactive to light.  Normal conjugate, extra-ocular eye movements in all directions of gaze.  No ptosis.  Face is symmetric. Palate elevates symmetrically.  Tongue is midline.  Intermittent tongue protrusion and lip licking.    MOTOR:  Motor strength is 5/5 in all extremities.  No atrophy, fasciculations or abnormal movements.  No pronator drift.  Tone is normal.    MSRs:  Reflexes are 2+/4 throughout, except absent at the ankles.  SENSORY:  Absent vibration below the ankles bilaterally.  COORDINATION/GAIT:  Normal finger-to- nose-finger.  Intact rapid alternating movements bilaterally.  Gait wide-based and stable. Unassisted.     Thank you for allowing me to participate in patient's care.  If I can answer any additional questions, I would be pleased to do so.    Sincerely,    Triton Heidrich K. Tobie, DO

## 2024-03-17 ENCOUNTER — Ambulatory Visit (INDEPENDENT_AMBULATORY_CARE_PROVIDER_SITE_OTHER): Admitting: Internal Medicine

## 2024-03-17 ENCOUNTER — Encounter: Payer: Self-pay | Admitting: Internal Medicine

## 2024-03-17 VITALS — BP 126/76 | HR 91 | Temp 98.6°F | Ht 62.0 in | Wt 266.8 lb

## 2024-03-17 DIAGNOSIS — G4733 Obstructive sleep apnea (adult) (pediatric): Secondary | ICD-10-CM

## 2024-03-17 DIAGNOSIS — I350 Nonrheumatic aortic (valve) stenosis: Secondary | ICD-10-CM

## 2024-03-17 DIAGNOSIS — F5101 Primary insomnia: Secondary | ICD-10-CM

## 2024-03-17 NOTE — Patient Instructions (Signed)
 Try otc nasal saline gel in your nose to soothe irritation. Any brand, like AYR, [employer], [employer].  Use as often as you nee it.   Try to use your CPAP regularly for at least 4 hours every night. If you can do this, we can look at getting you a replacement CPAP machine when this one gets old. Other wise we would have to do a new sleep test.

## 2024-03-18 ENCOUNTER — Encounter: Payer: Self-pay | Admitting: Internal Medicine

## 2024-03-18 DIAGNOSIS — I350 Nonrheumatic aortic (valve) stenosis: Secondary | ICD-10-CM | POA: Insufficient documentation

## 2024-03-18 NOTE — Assessment & Plan Note (Signed)
 Not using CPAP because she says nostrils were irritated. Plan- nasal saline gel and retry her CPAP.

## 2024-03-18 NOTE — Assessment & Plan Note (Signed)
 Irregular sleep habits Plan- sleep hygiene counseling, control OSA, then maybe consider sleep med if appropriate

## 2024-04-02 ENCOUNTER — Ambulatory Visit: Admitting: Podiatry

## 2024-04-02 ENCOUNTER — Encounter: Payer: Self-pay | Admitting: Podiatry

## 2024-04-02 DIAGNOSIS — B351 Tinea unguium: Secondary | ICD-10-CM | POA: Diagnosis not present

## 2024-04-02 DIAGNOSIS — M7751 Other enthesopathy of right foot: Secondary | ICD-10-CM | POA: Diagnosis not present

## 2024-04-02 DIAGNOSIS — M7750 Other enthesopathy of unspecified foot: Secondary | ICD-10-CM

## 2024-04-02 DIAGNOSIS — M778 Other enthesopathies, not elsewhere classified: Secondary | ICD-10-CM

## 2024-04-02 DIAGNOSIS — M79675 Pain in left toe(s): Secondary | ICD-10-CM | POA: Diagnosis not present

## 2024-04-02 DIAGNOSIS — M79674 Pain in right toe(s): Secondary | ICD-10-CM | POA: Diagnosis not present

## 2024-04-02 NOTE — Progress Notes (Unsigned)
 Subjective: Chief Complaint  Patient presents with   Nail Problem    Patient is here for RFC     63 year old female presents the office today with above concerns.  She states the nails got long so she trimming herself they do not need to be trimmed today.  She does not report any open lesions.    Regards to her overall foot, ankle pain she states that it comes and goes.  She can do some therapy.  She points on the dorsal aspect of right midfoot where she gets most of her discomfort and she is interested in steroid injection today.  She does not recall any recent injuries or changes.  Objective: AAO x3, NAD DP/PT pulses palpable bilaterally, CRT less than 3 seconds Sensation decreased with Semmes Weinstein monofilament. Nails are hypertrophic, dystrophic, brittle, discolored, elongated 10. No surrounding redness or drainage. Tenderness nails 1-5 bilaterally. No open lesions or pre-ulcerative lesions are identified today. Today the tenderness is localized on the dorsal lateral aspect of the midfoot.  There is no specific area of pinpoint tenderness identified otherwise and there is no significant edema there is no erythema.  Flexor, extensor tendons intact.  There does appear to be decreased dorsiflexion of the ankle bilaterally today compared to prior.  Dorsiflexion strength is 4/5 otherwise motion intact. No pain with calf compression, swelling, warmth, erythema  Assessment: Symptomatic onychomycosis chronic bilateral foot pain, type 2 diabetes with neuropathy;   Plan: Right foot pain - Has tenderness along dorsal aspect the right foot.  She was proceed with a steroid injection.  Mixture 0.5 cc of betamethasone  phosphate, 0.5 cc of Marcaine  plain was infiltrated into the area of maximal tenderness along dorsal lateral aspect the foot.  Care was taken not to directly infiltrate along the tendon.  Postinjection care discussed.  Tolerated well. - Continue with rehab exercises as well as  supportive shoe gear. - I am concerned that she is getting dropfoot.  If symptoms persist may to get further nerve testing and/or custom bracing.  For now continue physical therapy. - Trilock ankle brace is dispensed to help support and stabilize the foot, ankle.  Symptomatic onychomycosis - Sharply debrided nails x 10 without any complications or bleeding  No follow-ups on file.  Donnice JONELLE Fees DPM

## 2024-04-02 NOTE — Patient Instructions (Addendum)
 For instructions on how to put on your Tri-Lock Ankle Brace, please visit [email or web address] ? ?While at your visit today you received a steroid injection in your foot or ankle to help with your pain. Along with having the steroid medication there is some "numbing" medication in the shot that you received. Due to this you may notice some numbness to the area for the next couple of hours.  ? ?I would recommend limiting activity for the next few days to help the steroid injection take affect.  ?  ?The actually benefit from the steroid injection may take up to 2-7 days to see a difference. You may actually experience a small (as in 10%) INCREASE in pain in the first 24 hours---that is common. It would be best if you can ice the area today and take anti-inflammatory medications (such as Ibuprofen, Motrin, or Aleve) if you are able to take these medications. If you were prescribed another medication to help with the pain go ahead and start that medication today  ?  ?Things to watch out for that you should contact us or a health care provider urgently would include: ?1. Unusual (as in more than 10%) increase in pain ?2. New fever > 101.5 ?3. New swelling or redness of the injected area.  ?4. Streaking of red lines around the area injected. ? ?If you have any questions or concerns about this, please give our office a call at [phone].  ? ? ? ?

## 2024-04-06 ENCOUNTER — Ambulatory Visit: Admitting: Internal Medicine

## 2024-05-12 ENCOUNTER — Other Ambulatory Visit: Payer: Self-pay | Admitting: Physical Medicine and Rehabilitation

## 2024-06-02 ENCOUNTER — Ambulatory Visit (INDEPENDENT_AMBULATORY_CARE_PROVIDER_SITE_OTHER): Admitting: Podiatry

## 2024-06-02 ENCOUNTER — Encounter: Payer: Self-pay | Admitting: Podiatry

## 2024-06-02 DIAGNOSIS — M2142 Flat foot [pes planus] (acquired), left foot: Secondary | ICD-10-CM

## 2024-06-02 DIAGNOSIS — M79675 Pain in left toe(s): Secondary | ICD-10-CM

## 2024-06-02 DIAGNOSIS — M2141 Flat foot [pes planus] (acquired), right foot: Secondary | ICD-10-CM

## 2024-06-02 DIAGNOSIS — M7750 Other enthesopathy of unspecified foot: Secondary | ICD-10-CM | POA: Diagnosis not present

## 2024-06-02 DIAGNOSIS — M21379 Foot drop, unspecified foot: Secondary | ICD-10-CM | POA: Diagnosis not present

## 2024-06-02 DIAGNOSIS — M79674 Pain in right toe(s): Secondary | ICD-10-CM | POA: Diagnosis not present

## 2024-06-02 DIAGNOSIS — B351 Tinea unguium: Secondary | ICD-10-CM

## 2024-06-02 DIAGNOSIS — E1149 Type 2 diabetes mellitus with other diabetic neurological complication: Secondary | ICD-10-CM

## 2024-06-02 NOTE — Progress Notes (Unsigned)
 Subjective: Chief Complaint  Patient presents with   Diabetes    DFC NIDDM A1C 6.0. Toenail trim.     63 year old female presents the office today with above concerns.  Nails are thickened elongated she has difficulty trimming them.  No open lesions.  Regards to her overall foot, ankle pain she states that it comes and goes.  She is no longer doing formal physical therapy and she said that she has home therapy intermittently.  She said that she open still gets ongoing discomfort intermittently to her foot, ankles. No recent injuries.    Objective: AAO x3, NAD DP/PT pulses palpable bilaterally, CRT less than 3 seconds Sensation decreased with Semmes Weinstein monofilament. Nails are hypertrophic, dystrophic, brittle, discolored, elongated 10. No surrounding redness or drainage. Tenderness nails 1-5 bilaterally. No open lesions or pre-ulcerative lesions are identified today. Today the tenderness is localized on the dorsal lateral aspect of the midfoot and she also gets some diffuse discomfort on the ankle joints.  There is no specific area of pinpoint tenderness identified otherwise and there is no significant edema there is no erythema.  Flexor, extensor tendons intact.  There does appear to be decreased dorsiflexion of the ankle bilaterally today compared to prior.  Dorsiflexion strength is 4/5 otherwise motion intact. No pain with calf compression, swelling, warmth, erythema  Assessment: Symptomatic onychomycosis chronic bilateral foot pain, type 2 diabetes with neuropathy  Plan: Right foot pain - We held off on injection today.  I do think a lot of her discomfort is coming from her gait, also decreased ankle dorsiflexion.  She has some new shoes which have been somewhat helpful.  I do think she will benefit from further bracing.  She is not able to the Tri-Lock ankle brace as well.  Dispensed a hinged ankle brace, L 1906 today for both feet.  Hopefully this be easier to put on and help  give her better support and stability.  Continue home rehab exercises on a regular basis. - I am concerned that she is getting dropfoot.  If symptoms persist may to get further nerve testing and/or custom bracing.  For now continue physical therapy.  Symptomatic onychomycosis - Sharply debrided nails x 10 without any complications or bleeding  Return in about 3 months (around 08/31/2024).  Donnice JONELLE Fees DPM

## 2024-06-25 ENCOUNTER — Ambulatory Visit: Admitting: Family Medicine

## 2024-06-26 ENCOUNTER — Encounter: Payer: Self-pay | Admitting: Family Medicine

## 2024-06-26 ENCOUNTER — Ambulatory Visit: Admitting: Family Medicine

## 2024-06-26 VITALS — BP 134/78 | HR 100 | Temp 98.2°F | Ht 62.25 in | Wt 274.3 lb

## 2024-06-26 DIAGNOSIS — G2401 Drug induced subacute dyskinesia: Secondary | ICD-10-CM

## 2024-06-26 DIAGNOSIS — Z7985 Long-term (current) use of injectable non-insulin antidiabetic drugs: Secondary | ICD-10-CM

## 2024-06-26 DIAGNOSIS — E1169 Type 2 diabetes mellitus with other specified complication: Secondary | ICD-10-CM | POA: Diagnosis not present

## 2024-06-26 DIAGNOSIS — R251 Tremor, unspecified: Secondary | ICD-10-CM | POA: Diagnosis not present

## 2024-06-26 DIAGNOSIS — K219 Gastro-esophageal reflux disease without esophagitis: Secondary | ICD-10-CM | POA: Diagnosis not present

## 2024-06-26 NOTE — Progress Notes (Signed)
 "  New Patient Office Visit  Subjective    Patient ID: Kelsey Wilson, female    DOB: 01-Nov-1960  Age: 64 y.o. MRN: [id number]  CC:  Chief Complaint  Patient presents with   Establish Care    HPI Kelsey Wilson presents to establish care Discussed the use of AI scribe software for clinical note transcription with the patient, who gave verbal consent to proceed.  History of Present Illness   Kelsey Wilson is a 64 year old female who presents with ongoing tremors, involuntary movements, and worsening reflux.  She has had tremors, shaking, and involuntary lip and mouth movements for 4 to 5 years. Symptoms include intermittent shimmers and shakes, mouth movements, and larger movements that affect balance and limit her ability to drive. She has seen a neurologist and a movement specialist without clear resolution. She believes symptoms began after taking a weight management medication while on Ingrezza . She has a family history of tremors in her mother and brother. She reports frequent imbalance and falls, which are harder to manage due to her knee replacement.  Her reflux has worsened, with frequent sensation of a big glob of phlegm or spit coming up and concern it is affecting her teeth. Current reflux medication does not control symptoms. She has a known small hiatal hernia. An EGD in 2022 showed a normal stomach and a small hiatal hernia.  Her medical history includes breast cancer treated with surgery, chemotherapy, and radiation, with ongoing oncology follow up and a recent biopsy for possible recurrence. She has diabetes managed by an endocrinologist and takes Mounjaro . She has chronic lung issues after a lung spot found post-breast cancer treatment and uses inhalers. She has a knee replacement that contributes to difficulty getting up after falls.  Current medications include tramadol  for pain, Mounjaro , a muscle relaxer, inhalers, Veozah for menopausal symptoms, ibuprofen, a  recent steroid dose pack, and allergy medications including Singulair  and Xyzal.       Outpatient Encounter Medications as of 06/26/2024  Medication Sig   bumetanide  (BUMEX ) 2 MG tablet Take 1 tablet every day by oral route.   clindamycin (CLEOCIN) 300 MG capsule Take 600 mg by mouth See admin instructions. Take two capsules (600 mg) by mouth one hour prior to dental procedures   cyclobenzaprine  (FLEXERIL ) 5 MG tablet TAKE ONE TABLET BY MOUTH TWICE DAILY AS NEEDED FOR MUSCLE SPASMS NOTE: MAXIMUM REFILLS REACHED   Dexlansoprazole 30 MG capsule DR Take 30 mg by mouth every morning.   dicyclomine  (BENTYL ) 10 MG capsule    DULoxetine  (CYMBALTA ) 60 MG capsule Take 60 mg by mouth every morning.   ezetimibe (ZETIA) 10 MG tablet    furosemide  (LASIX ) 20 MG tablet Take 20 mg by mouth every morning.   gabapentin  (NEURONTIN ) 300 MG capsule TAKE ONE CAPSULE (300MG  TOTAL) BY MOUTH THREE TIMES DAILY AT 9AM, 1PM AND 5PM   levocetirizine (XYZAL) 5 MG tablet Take 5 mg by mouth at bedtime.   metoprolol succinate (TOPROL-XL) 25 MG 24 hr tablet Take 12.5 mg by mouth every morning.   montelukast  (SINGULAIR ) 10 MG tablet Take 10 mg by mouth every morning.   rosuvastatin (CRESTOR) 10 MG tablet Take 10 mg by mouth at bedtime.   tirzepatide  (MOUNJARO ) 10 MG/0.5ML Pen Inject 10 mg into the skin once a week.   traMADol  (ULTRAM ) 50 MG tablet Take 1- 2  tablets three times a day as needed for pain.   umeclidinium-vilanterol (ANORO ELLIPTA ) 62.5-25 MCG/ACT AEPB 1 puff  Inhalation Once a day   umeclidinium-vilanterol (ANORO ELLIPTA ) 62.5-25 MCG/INH AEPB Inhale 1 puff into the lungs daily.   venlafaxine  XR (EFFEXOR -XR) 37.5 MG 24 hr capsule Take 1 tablet by mouth daily.   VEOZAH 45 MG TABS Take 45 mg by mouth daily.   [DISCONTINUED] ALPRAZolam  (XANAX ) 0.25 MG tablet Take 0.25 mg by mouth See admin instructions. Take one tablet (0.25 mg) by mouth twice daily - morning and mid-afternoon   [DISCONTINUED] atorvastatin (LIPITOR)  20 MG tablet Take 20 mg by mouth daily.   [DISCONTINUED] cloNIDine (CATAPRES) 0.1 MG tablet Take by mouth.   [DISCONTINUED] lisinopril (ZESTRIL) 5 MG tablet Take 5 mg by mouth every morning.   ibuprofen (ADVIL) 800 MG tablet Take 800 mg by mouth every 8 (eight) hours as needed.   [DISCONTINUED] albuterol  (VENTOLIN  HFA) 108 (90 Base) MCG/ACT inhaler Inhale 2 puffs into the lungs every 6 (six) hours as needed for wheezing or shortness of breath.   [DISCONTINUED] ipratropium (ATROVENT) 0.03 % nasal spray    [DISCONTINUED] methocarbamol  (ROBAXIN ) 500 MG tablet Take 1,000 mg by mouth 3 (three) times daily.   [DISCONTINUED] naloxone  (NARCAN ) nasal spray 4 mg/0.1 mL Place 0.4 mg into the nose daily as needed (opioid overdose).   [DISCONTINUED] Vitamin D , Ergocalciferol , (DRISDOL ) 1.25 MG (50000 UNIT) CAPS capsule TAKE ONE CAPSULE (50,000 UNITS TOTAL) BY MOUTH EVERY OTHER WEEK. THIS IS NEW DIRECTIONS   Facility-Administered Encounter Medications as of 06/26/2024  Medication   triamcinolone  acetonide (KENALOG ) 10 MG/ML injection 10 mg    Past Medical History:  Diagnosis Date   Anemia    Anxiety    Arthritis    Breast cancer (HCC) 2013   T3N1 invasive ductal carcinoma left breast.Takes Arimidex  daily   Bursitis    Carpal tunnel syndrome    Chronic back pain    stenosis   Constipation    takes Colace daily   Depression    takes Benzotropine daily   Diverticulitis of colon    Dyspnea    daily when walking for over 1 yr.   Fibromyalgia 08/2012   GERD (gastroesophageal reflux disease)    takes Dexilant daily   Hemorrhoid    History of blood transfusion    no abnormal reaction noted   History of colon polyps    benign   History of shingles    Joint pain    Joint swelling    Morbid obesity (HCC)    Night muscle spasms    takes Flexeril  nightly as needed   Nocturia    OSA (obstructive sleep apnea)    OSA on CPAP    Peripheral edema    takes Furosemide .Just started 01/18/16    Peripheral neuropathy    takes Lyrica  daily   Personal history of chemotherapy    2013   Personal history of radiation therapy    2013   Pneumonia    hx of-2015   Pseudoarthrosis of lumbar spine    Seasonal allergies    takes Singulair  nightly   SOB (shortness of breath) on exertion    rarely with exertion   Splenorenal shunt malfunction    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 Marlana)   Type II diabetes mellitus (HCC)     Past Surgical History:  Procedure Laterality Date   APPENDECTOMY     AXILLARY LYMPH NODE DISSECTION  11/28/2011   Procedure: AXILLARY LYMPH NODE DISSECTION;  Surgeon: Morene ONEIDA Olives, MD;  Location: [facility];  Service: General;  Laterality: Left;   BREAST BIOPSY Right 09/16/2023   MM RT BREAST BX W LOC DEV 1ST LESION IMAGE BX SPEC STEREO GUIDE 09/16/2023 [facility]   BREAST LUMPECTOMY Left 11/28/2011   Malignant   BREAST SURGERY Left 2013   CARPAL TUNNEL RELEASE     Bilateral   CESAREAN SECTION     pt. has had 3   CHOLECYSTECTOMY     COLONOSCOPY     COLONOSCOPY WITH PROPOFOL  N/A 01/24/2021   Procedure: COLONOSCOPY WITH PROPOFOL ;  Surgeon: Legrand Victory LITTIE DOUGLAS, MD;  Location: [facility];  Service: Gastroenterology;  Laterality: N/A;   ESOPHAGOGASTRODUODENOSCOPY (EGD) WITH PROPOFOL  N/A 01/24/2021   Procedure: ESOPHAGOGASTRODUODENOSCOPY (EGD) WITH PROPOFOL ;  Surgeon: Legrand Victory LITTIE DOUGLAS, MD;  Location: [facility];  Service: Gastroenterology;  Laterality: N/A;   EYE SURGERY Bilateral    cataract removal   KNEE SURGERY     Left Knee   LAMINECTOMY WITH POSTERIOR LATERAL ARTHRODESIS LEVEL 1 N/A 11/29/2017   Procedure: Posterior Lateral Fusion - Lumbar Four-Lumbar Five, removal and replacement of hardware, Laminectomy - Lumbar Four-Lumbar Five;  Surgeon: Joshua Alm RAMAN, MD;  Location: [facility];  Service: Neurosurgery;  Laterality: N/A;   LAMINECTOMY WITH POSTERIOR LATERAL  ARTHRODESIS LEVEL 2 N/A 06/07/2017   Procedure: Posterior Lateral Fusion Lumbar Three-Four and Transforaminal Interbody Fusion Lumbar Four-Five with Segmental  Pedicle Screw Fixation;  Surgeon: Joshua Alm RAMAN, MD;  Location[facility];  Service: Neurosurgery;  Laterality: N/A;  Posterior Lateral Fusion Lumbar Three-Four and Transforaminal Interbody Fusion Lumbar Four-Five with Segmental  Pedicle Screw Fixation    LUMBAR FUSION  06/07/2017   POST   LUMBAR LAMINECTOMY/DECOMPRESSION MICRODISCECTOMY Bilateral 01/27/2016   Procedure: Laminectomy and Foraminotomy - Lumbar four -Lumbar five - bilateral- on-lay noninstrumented fusion;  Surgeon: Alm RAMAN Joshua, MD;  Location: [facility];  Service: Neurosurgery;  Laterality: Bilateral;   MULTIPLE EXTRACTIONS WITH ALVEOLOPLASTY N/A 05/11/2016   Procedure: EXTRACTION OF TEETH EIGHTEEN, TWENTY AND TWENTY- NINE;  REMOVAL OF MANDIBULAR TORUS AND EXOSTOSIS;  Surgeon: Glendia Primrose, DDS;  Location: [facility];  Service: Oral Surgery;  Laterality: N/A;   POLYPECTOMY  01/24/2021   Procedure: POLYPECTOMY;  Surgeon: Legrand Victory LITTIE DOUGLAS, MD;  Location: [facility];  Service: Gastroenterology;;   PORTACATH PLACEMENT  06/18/2011   Procedure: INSERTION PORT-A-CATH;  Surgeon: Morene ONEIDA Olives, MD;  Location: [facility];  Service: General;  Laterality: Right;  right subclavian   removal portacath  2014   spur     Apex spur on both big toes   TOE SURGERY Bilateral    TONSILLECTOMY     TOTAL ABDOMINAL HYSTERECTOMY     still has ovaries   TOTAL KNEE ARTHROPLASTY Left 09/13/2014   TOTAL KNEE ARTHROPLASTY Left 09/13/2014   Procedure: LEFT TOTAL KNEE ARTHROPLASTY;  Surgeon: Redell Shoals, MD;  Location: [facility];  Service: Orthopedics;  Laterality: Left;   TOTAL KNEE ARTHROPLASTY Right 03/10/2015   Procedure: RIGHT TOTAL KNEE ARTHROPLASTY;  Surgeon: Redell Shoals, MD;  Location: [facility];  Service: Orthopedics;  Laterality: Right;    Family History  Problem Relation  Age of Onset   Hypertension Mother    Diabetes Mother    Hypertension Father    Diabetes Father    Hypertension Sister    Hyperlipidemia Brother    Breast cancer Paternal Aunt    Cancer Paternal Grandmother        unknown   Colon cancer Neg Hx  Parkinson's disease Neg Hx     Social History   Socioeconomic History   Marital status: Single    Spouse name: Not on file   Number of children: 3   Years of education: Not on file   Highest education level: Not on file  Occupational History   Not on file  Tobacco Use   Smoking status: Never   Smokeless tobacco: Never  Vaping Use   Vaping status: Never Used  Substance and Sexual Activity   Alcohol use: No    Alcohol/week: 0.0 standard drinks of alcohol   Drug use: No   Sexual activity: Never    Birth control/protection: Post-menopausal, Surgical  Other Topics Concern   Not on file  Social History Narrative   Are you right handed or left handed? Right Handed    Are you currently employed ? No    What is your current occupation?    Do you live at home alone? No    Who lives with you? Grandsons    What type of home do you live in: 1 story or 2 story? Lives in a one story home.        Social Drivers of Health   Tobacco Use: Low Risk (06/26/2024)   Patient History    Smoking Tobacco Use: Never    Smokeless Tobacco Use: Never    Passive Exposure: Not on file  Financial Resource Strain: Not on file  Food Insecurity: Not on file  Transportation Needs: Not on file  Physical Activity: Not on file  Stress: Not on file  Social Connections: Not on file  Intimate Partner Violence: Not on file  Depression (PHQ2-9): Low Risk (02/25/2023)   Depression (PHQ2-9)    PHQ-2 Score: 0  Alcohol Screen: Not on file  Housing: Not on file  Utilities: Not on file  Health Literacy: Not on file    Review of Systems  All other systems reviewed and are negative.       Objective    BP 134/78   Pulse 100   Temp 98.2 F (36.8 C)  (Oral)   Ht 5' 2.25 (1.581 m)   Wt 274 lb 4.8 oz (124.4 kg)   SpO2 97%   BMI 49.77 kg/m   Physical Exam Vitals reviewed.  Constitutional:      Appearance: Normal appearance. She is well-groomed. She is morbidly obese.  Eyes:     Conjunctiva/sclera: Conjunctivae normal.  Neck:     Thyroid : No thyromegaly.  Cardiovascular:     Rate and Rhythm: Normal rate and regular rhythm.     Pulses: Normal pulses.     Heart sounds: S1 normal and S2 normal.  Pulmonary:     Effort: Pulmonary effort is normal.     Breath sounds: Normal breath sounds and air entry.  Abdominal:     General: Bowel sounds are normal.  Musculoskeletal:     Right lower leg: No edema.     Left lower leg: No edema.  Neurological:     Mental Status: She is alert and oriented to person, place, and time. Mental status is at baseline.     Cranial Nerves: Cranial nerves 2-12 are intact.     Sensory: Sensation is intact.     Motor: Tremor present.     Gait: Gait is intact.     Comments: Diffuse slight tremor noted at rest and movement in the upper extremities and trunk  Psychiatric:        Mood and Affect:  Mood and affect normal.        Speech: Speech normal.        Behavior: Behavior normal.        Judgment: Judgment normal.         Assessment & Plan:   Problem List Items Addressed This Visit       Unprioritized   GERD (gastroesophageal reflux disease)   Pt reports today that she continues to have GERD symptoms despite the use of PPI, also she is reporting some evidence of a lump in the back or her throat and perhaps some swallowing difficulty? Pt's speech is somewhat difficult to understand at times, will order referral to GI for possible need for repeat scope-- reviewed her last Endoscopy from 2022. Continue PPI for now.       Relevant Orders   Ambulatory referral to Gastroenterology   Type II diabetes mellitus (HCC)   Sees Endocrine for this, however she is due for updated labs, will place orders  today.       Relevant Orders   CMP (Completed)   Microalbumin / creatinine urine ratio (Completed)   Other Visit Diagnoses       Tardive dyskinesia    -  Primary   Relevant Orders   ANA w/Reflex (Completed)   Antiphospholopid Ab Panel   Paraneoplastic Ab (Completed)   Ceruloplasmin     Tremor, unspecified       Relevant Orders   ANA w/Reflex (Completed)   Antiphospholopid Ab Panel   Paraneoplastic Ab (Completed)   Ceruloplasmin     Assessment and Plan    Tardive dyskinesia and tremor Chronic tremors and involuntary lip movements for 4-5 years, possibly related to past psychotropic medication use. Differential includes Parkinson's disease, Huntington's disease, Wilson's disease, and autoimmune disorders. Previous MRI and labs were unremarkable. No current psychiatric medications. Symptoms affect daily activities, including driving. - Ordered additional labs to rule out autoimmune diseases and Wilson's disease. - Referred to neurology specialist at [facility] or [facility] for further evaluation. - Will consider genetic testing for Huntington's disease if recommended by neurology.  Gastroesophageal reflux disease Worsening reflux symptoms despite current medication. Previous endoscopy in 2022 showed a small hiatal hernia. Symptoms include glob sensation in throat and potential swallowing difficulties. Mounjaro  may contribute to delayed gastric emptying. - Referred to GI specialist for further evaluation and possible repeat endoscopy. - Continue current reflux medication and monitor symptoms.        Return in about 3 months (around 09/24/2024).   Heron CHRISTELLA Sharper, MD   "

## 2024-06-27 LAB — MICROALBUMIN / CREATININE URINE RATIO
Creatinine, Urine: 34.4 mg/dL
Microalb/Creat Ratio: <9 mg/g{creat} (ref 0–29)
Microalbumin, Urine: <3 ug/mL

## 2024-06-29 ENCOUNTER — Ambulatory Visit: Payer: Self-pay | Admitting: Family Medicine

## 2024-06-29 ENCOUNTER — Telehealth: Payer: Self-pay | Admitting: Pharmacy Technician

## 2024-06-29 ENCOUNTER — Other Ambulatory Visit (HOSPITAL_COMMUNITY): Payer: Self-pay

## 2024-06-29 DIAGNOSIS — R251 Tremor, unspecified: Secondary | ICD-10-CM

## 2024-06-29 DIAGNOSIS — G2401 Drug induced subacute dyskinesia: Secondary | ICD-10-CM

## 2024-06-29 NOTE — Telephone Encounter (Signed)
 Pharmacy Patient Advocate Encounter   Received notification from [employer] that prior authorization for Mounjaro  10MG /0.5ML auto-injectors  is required/requested.   Insurance verification completed.   The patient is insured through [facility].   Per test claim: PA required; PA submitted to above mentioned insurance via Latent Key/confirmation #/EOC [id number] Status is pending

## 2024-06-29 NOTE — Assessment & Plan Note (Signed)
 Sees Endocrine for this, however she is due for updated labs, will place orders today.

## 2024-06-29 NOTE — Assessment & Plan Note (Signed)
 Pt reports today that she continues to have GERD symptoms despite the use of PPI, also she is reporting some evidence of a lump in the back or her throat and perhaps some swallowing difficulty? Pt's speech is somewhat difficult to understand at times, will order referral to GI for possible need for repeat scope-- reviewed her last Endoscopy from 2022. Continue PPI for now.

## 2024-06-29 NOTE — Telephone Encounter (Signed)
 Pharmacy Patient Advocate Encounter  Received notification from [facility] that Prior Authorization for Mounjaro  10MG /0.5ML auto-injectors  has been APPROVED from 06/29/24 to 06/29/25. Ran test claim, Copay is $4.90. This test claim was processed through [facility]- copay amounts may vary at other pharmacies due to pharmacy/plan contracts, or as the patient moves through the different stages of their insurance plan.   PA #/Case ID/Reference #: [id number]

## 2024-06-30 LAB — ANTIPHOSPHOLOPID AB PANEL
Anticardiolipin IgA: <2 [APL'U]/mL
Anticardiolipin IgG: <2 [GPL'U]/mL
Anticardiolipin IgM: <2 [MPL'U]/mL
Beta-2 Glyco 1 IgA: <2 U/mL
Beta-2 Glyco 1 IgM: <2 U/mL
Beta-2 Glyco I IgG: <2 U/mL
Phosphatidylserine/Prothrombin Ab (IgG): 10 U
Phosphatidylserine/Prothrombin Ab (IgM): 10 U

## 2024-06-30 LAB — CERULOPLASMIN: Ceruloplasmin: 31 mg/dL (ref 14–48)

## 2024-06-30 NOTE — Progress Notes (Unsigned)
 "  Established Patient Pulmonology Office Visit   Patient ID: Kelsey Wilson, female    DOB: 23-Sep-1960   MRN: [id number] Synopsis: 64 y.o. patient with OSA, asthma, allergic rhinitis. HST 01/30/2019- AHI 23.7/ hr, desaturation to 71%, body weight 293 lbs NPSG 07/23/2016- AHI 16.8/ hr, desaturation to 72% HST 01/09/23- AHI 15.3/hr, desat to 82%, body weight 268 lbs   Discussed the use of AI scribe software for clinical note transcription with the patient, who gave verbal consent to proceed.  History of Present Illness Kelsey Wilson is a 64 year old female with obstructive sleep apnea and asthma who presents for follow-up after transferring care. She is transferring her care from Dr. Neysa, who has recently retired.  Obstructive sleep apnea - Auto CPAP set at 5 to 20 cm H2O with fair adherence - Ongoing daytime sleepiness and chronic insomnia - Home sleep study in August 2024 showed AHI of 15.3 and SpO2 nadir of 82% at a weight of 268 pounds - Residual AHI at last visit was 8 to 9 with suboptimal adherence - Prefers nasal mask; experiences discomfort and strap issues with current mask - Misplaced water tank for CPAP machine and currently unable to use CPAP as a result - Chronic insomnia treated with Xanax   Asthma - Treated with Anoro Ellipta  and albuterol  as needed - No tobacco use history  Allergic rhinitis - History of allergic rhinitis  Pulmonary nodules - Stable lung nodules on CT from 2022 - Initially referred for sleep apnea evaluation after lung nodule found during cancer workup - On the basis of these nodules being stable from prior per 2022 CT Chest interpretation, and no smoking history, no strict indication for further follow up. Indeed, only CXR has been performed by Dr. Neysa since that original study.   Pulm Questionnaires:     10/23/2018    1:00 PM  Results of the [facility] flowsheet  Sitting and reading 2  Watching TV 3  Sitting, inactive in a public place (e.g.  a theatre or a meeting) 2  As a passenger in a car for an hour without a break 2  Lying down to rest in the afternoon when circumstances permit 3  Sitting and talking to someone 1  Sitting quietly after a lunch without alcohol 2  In a car, while stopped for a few minutes in traffic 1  Total score 16     Outpatient Medications Prior to Visit  Medication Sig Dispense Refill   bumetanide  (BUMEX ) 2 MG tablet Take 1 tablet every day by oral route.     clindamycin (CLEOCIN) 300 MG capsule Take 600 mg by mouth See admin instructions. Take two capsules (600 mg) by mouth one hour prior to dental procedures     cloNIDine (CATAPRES) 0.1 MG tablet Take by mouth.     cyclobenzaprine  (FLEXERIL ) 5 MG tablet TAKE ONE TABLET BY MOUTH TWICE DAILY AS NEEDED FOR MUSCLE SPASMS NOTE: MAXIMUM REFILLS REACHED 60 tablet 3   Dexlansoprazole 30 MG capsule DR Take 30 mg by mouth every morning.     DULoxetine  (CYMBALTA ) 60 MG capsule Take 60 mg by mouth every morning.     ezetimibe (ZETIA) 10 MG tablet      furosemide  (LASIX ) 20 MG tablet Take 20 mg by mouth every morning.     gabapentin  (NEURONTIN ) 300 MG capsule TAKE ONE CAPSULE (300MG  TOTAL) BY MOUTH THREE TIMES DAILY AT 9AM, 1PM AND 5PM 90 capsule 11   ibuprofen (ADVIL) 800 MG tablet Take  800 mg by mouth every 8 (eight) hours as needed.     levocetirizine (XYZAL) 5 MG tablet Take 5 mg by mouth at bedtime.     metoprolol succinate (TOPROL-XL) 25 MG 24 hr tablet Take 12.5 mg by mouth every morning.     montelukast  (SINGULAIR ) 10 MG tablet Take 10 mg by mouth every morning.     rosuvastatin (CRESTOR) 10 MG tablet Take 10 mg by mouth at bedtime.     tirzepatide  (MOUNJARO ) 10 MG/0.5ML Pen Inject 10 mg into the skin once a week. 6 mL 3   traMADol  (ULTRAM ) 50 MG tablet Take 1- 2  tablets three times a day as needed for pain. 180 tablet 5   umeclidinium-vilanterol (ANORO ELLIPTA ) 62.5-25 MCG/INH AEPB Inhale 1 puff into the lungs daily. 1 each 12   venlafaxine  XR  (EFFEXOR -XR) 37.5 MG 24 hr capsule Take 1 tablet by mouth daily.     VEOZAH 45 MG TABS Take 45 mg by mouth daily.     dicyclomine  (BENTYL ) 10 MG capsule  (Patient not taking: Reported on 07/01/2024)     lisinopril (ZESTRIL) 5 MG tablet Take 5 mg by mouth daily. (Patient not taking: Reported on 07/01/2024)     umeclidinium-vilanterol (ANORO ELLIPTA ) 62.5-25 MCG/ACT AEPB 1 puff Inhalation Once a day (Patient not taking: Reported on 07/01/2024)     Facility-Administered Medications Prior to Visit  Medication Dose Route Frequency Provider Last Rate Last Admin   triamcinolone  acetonide (KENALOG ) 10 MG/ML injection 10 mg  10 mg Other Once Burt Fus, DPM             Vitals:   07/01/24 1459  BP: (!) 88/61  Pulse: 97  Temp: 98.4 F (36.9 C)  SpO2: 96%  Weight: 274 lb 6.4 oz (124.5 kg)  Height: 5' 2 (1.575 m)   96% on RA  BMI Readings from Last 3 Encounters:  07/01/24 50.19 kg/m  06/26/24 49.77 kg/m  03/17/24 48.80 kg/m   Wt Readings from Last 3 Encounters:  07/01/24 274 lb 6.4 oz (124.5 kg)  06/26/24 274 lb 4.8 oz (124.4 kg)  03/17/24 266 lb 12.8 oz (121 kg)    Physical Exam GEN: Alert and oriented x3, obese HEENT: Normocephalic, normal conjunctivae, anicteric sclera, many missing lower > upper teeth (not appropriate dentition for dental appliance unfortunately) PULM: Normal respiratory rate, depth and effort NEURO: Normal mood and thought content   CBC:    Component Value Date/Time   WBC 6.4 09/29/2021 0745   RBC 4.59 09/29/2021 0745   HGB 13.3 09/29/2021 0745   HGB 12.4 09/05/2017 1112   HGB 13.6 03/07/2017 1327   HCT 40.0 09/29/2021 0745   HCT 41.5 03/07/2017 1327   PLT 209 09/29/2021 0745   PLT 211 09/05/2017 1112   PLT 189 03/07/2017 1327   MCV 87.1 09/29/2021 0745   MCV 86.8 03/07/2017 1327   RDW 13.4 09/29/2021 0745   RDW 14.4 03/07/2017 1327   EOSABS 0.0 09/29/2021 0745   EOSABS 0.1 03/07/2017 1327    Serum HCO3: CO2  Date/Time Value Ref Range  Status  06/26/2024 03:55 PM 24 20 - 29 mmol/L Final  04/08/2023 02:36 PM 31 19 - 32 mEq/L Final  09/29/2021 07:45 AM 29 22 - 32 mmol/L Final  03/07/2017 01:27 PM 32 (H) 22 - 29 mEq/L Final  06/14/2016 09:40 AM 30 (H) 22 - 29 mEq/L Final  06/15/2015 12:50 PM 28 22 - 29 mEq/L Final    Hemoglobin A1c:  Component Value Date/Time   HGBA1C 6.0 (A) 01/23/2024 1041   HGBA1C 6.0 (A) 10/08/2023 1359   HGBA1C 5.8 (A) 04/08/2023 1434    TSH: TSH  Date/Time Value Ref Range Status  04/08/2023 02:36 PM 1.68 0.35 - 5.50 uIU/mL Final  09/29/2021 07:45 AM 1.271 0.350 - 4.500 uIU/mL Final    Comment:    Performed by a 3rd Generation assay with a functional sensitivity of <=0.01 uIU/mL. Performed at [facility], [address], [place], [place] [address]     Iron Studies: No results found for: IRON, TIBC, IRONPCTSAT, FERRITIN  ABG: No results found for: PHART, PCO2ART, PO2ART, HCO3, TCO2, ACIDBASEDEF, O2SAT   VBG: No results found for: PHVEN, PCO2VEN, PO2VEN    Pulmonary Functions Testing Results:     No data to display              Assessment and Plan Assessment & Plan Obstructive sleep apnea Moderate obstructive sleep apnea with AHI 15.3, SpO2 nadir 82%. Current CPAP therapy suboptimal due to discomfort and mask issues. Oral appliance not feasible due to insufficient dentition. Symptomatic with significant EDS symptoms. - Sent message to [employer] to expedite delivery of new water tank and small P-10 nasal pillows mask and chin strap. - Scheduled follow-up appointment in April or May to assess CPAP therapy adherence and effectiveness / reduction in EDS symptoms.   As of conclusion of today's encounter: Current Medications[1]   Time spent on day of this encounter (includes time spent face-to-face with the patient as well as time spent the same day as the encounter reviewing existing data and notes, and/or documenting my  findings and the plan of care): 32 minutes  Lamar Dales, MD Pulmonary, Critical Care & Sleep Medicine [facility] 07/01/24 3:37 PM     [1]  Current Outpatient Medications:    bumetanide  (BUMEX ) 2 MG tablet, Take 1 tablet every day by oral route., Disp: , Rfl:    clindamycin (CLEOCIN) 300 MG capsule, Take 600 mg by mouth See admin instructions. Take two capsules (600 mg) by mouth one hour prior to dental procedures, Disp: , Rfl:    cloNIDine (CATAPRES) 0.1 MG tablet, Take by mouth., Disp: , Rfl:    cyclobenzaprine  (FLEXERIL ) 5 MG tablet, TAKE ONE TABLET BY MOUTH TWICE DAILY AS NEEDED FOR MUSCLE SPASMS NOTE: MAXIMUM REFILLS REACHED, Disp: 60 tablet, Rfl: 3   Dexlansoprazole 30 MG capsule DR, Take 30 mg by mouth every morning., Disp: , Rfl:    DULoxetine  (CYMBALTA ) 60 MG capsule, Take 60 mg by mouth every morning., Disp: , Rfl:    ezetimibe (ZETIA) 10 MG tablet, , Disp: , Rfl:    furosemide  (LASIX ) 20 MG tablet, Take 20 mg by mouth every morning., Disp: , Rfl:    gabapentin  (NEURONTIN ) 300 MG capsule, TAKE ONE CAPSULE (300MG  TOTAL) BY MOUTH THREE TIMES DAILY AT 9AM, 1PM AND 5PM, Disp: 90 capsule, Rfl: 11   ibuprofen (ADVIL) 800 MG tablet, Take 800 mg by mouth every 8 (eight) hours as needed., Disp: , Rfl:    levocetirizine (XYZAL) 5 MG tablet, Take 5 mg by mouth at bedtime., Disp: , Rfl:    metoprolol succinate (TOPROL-XL) 25 MG 24 hr tablet, Take 12.5 mg by mouth every morning., Disp: , Rfl:    montelukast  (SINGULAIR ) 10 MG tablet, Take 10 mg by mouth every morning., Disp: , Rfl:    rosuvastatin (CRESTOR) 10 MG tablet, Take 10 mg by mouth at bedtime., Disp: , Rfl:  tirzepatide  (MOUNJARO ) 10 MG/0.5ML Pen, Inject 10 mg into the skin once a week., Disp: 6 mL, Rfl: 3   traMADol  (ULTRAM ) 50 MG tablet, Take 1- 2  tablets three times a day as needed for pain., Disp: 180 tablet, Rfl: 5   umeclidinium-vilanterol (ANORO ELLIPTA ) 62.5-25 MCG/INH AEPB, Inhale 1 puff into the lungs  daily., Disp: 1 each, Rfl: 12   venlafaxine  XR (EFFEXOR -XR) 37.5 MG 24 hr capsule, Take 1 tablet by mouth daily., Disp: , Rfl:    VEOZAH 45 MG TABS, Take 45 mg by mouth daily., Disp: , Rfl:    dicyclomine  (BENTYL ) 10 MG capsule, , Disp: , Rfl:    lisinopril (ZESTRIL) 5 MG tablet, Take 5 mg by mouth daily. (Patient not taking: Reported on 07/01/2024), Disp: , Rfl:    umeclidinium-vilanterol (ANORO ELLIPTA ) 62.5-25 MCG/ACT AEPB, 1 puff Inhalation Once a day (Patient not taking: Reported on 07/01/2024), Disp: , Rfl:   Current Facility-Administered Medications:    triamcinolone  acetonide (KENALOG ) 10 MG/ML injection 10 mg, 10 mg, Other, Once, Stover, Titorya, DPM  "

## 2024-07-01 ENCOUNTER — Encounter: Payer: Self-pay | Admitting: Pulmonary Disease

## 2024-07-01 ENCOUNTER — Ambulatory Visit (INDEPENDENT_AMBULATORY_CARE_PROVIDER_SITE_OTHER): Admitting: Pulmonary Disease

## 2024-07-01 VITALS — BP 88/61 | HR 97 | Temp 98.4°F | Ht 62.0 in | Wt 274.4 lb

## 2024-07-01 DIAGNOSIS — G4733 Obstructive sleep apnea (adult) (pediatric): Secondary | ICD-10-CM

## 2024-07-01 NOTE — Patient Instructions (Addendum)
 VISIT SUMMARY: We discussed a treatment plan for your obstructive sleep apnea today.  OBSTRUCTIVE SLEEP APNEA: You have moderate obstructive sleep apnea with ongoing daytime sleepiness. Your current CPAP therapy is not optimal due to mask selection and the fact that you are missing a humidification chamber for your CPAP device.  -We discussed that unfortunately you are not an appropriate candidate for a dental appliance for sleep apnea due to your dentition (specifically, you are missing a large number of both upper and especially lower teeth).  -Instead, we have resolved to get you started back with CPAP therapy.  -We will send a message to [employer] to request they send a new humidification chamber and a small P-10 nasal pillows mask for your CPAP machine. You endorsed finding this mask very comfortable in the past.  -We will schedule a follow-up appointment in April or May to assess your CPAP therapy adherence and effectiveness.   Contains text generated by [employer].

## 2024-07-02 LAB — COMPREHENSIVE METABOLIC PANEL WITH GFR
ALT: 12 [IU]/L (ref 0–32)
AST: 21 [IU]/L (ref 0–40)
Albumin: 4.7 g/dL (ref 3.9–4.9)
Alkaline Phosphatase: 85 [IU]/L (ref 49–135)
BUN/Creatinine Ratio: 19 (ref 12–28)
BUN: 20 mg/dL (ref 8–27)
Bilirubin Total: 0.7 mg/dL (ref 0.0–1.2)
CO2: 24 mmol/L (ref 20–29)
Calcium: 9.7 mg/dL (ref 8.7–10.3)
Chloride: 99 mmol/L (ref 96–106)
Creatinine, Ser: 1.05 mg/dL — ABNORMAL HIGH (ref 0.57–1.00)
Globulin, Total: 3 g/dL (ref 1.5–4.5)
Glucose: 113 mg/dL — ABNORMAL HIGH (ref 70–99)
Potassium: 4 mmol/L (ref 3.5–5.2)
Sodium: 141 mmol/L (ref 134–144)
Total Protein: 7.7 g/dL (ref 6.0–8.5)
eGFR: 59 mL/min/{1.73_m2} — ABNORMAL LOW

## 2024-07-02 LAB — PARANEOPLASTIC AB: VGCC Antibody: <1 pmol/L (ref 0.0–30.0)

## 2024-07-02 LAB — ANA W/REFLEX: Anti Nuclear Antibody (ANA): NEGATIVE

## 2024-07-06 ENCOUNTER — Other Ambulatory Visit: Payer: Self-pay | Admitting: Registered Nurse

## 2024-07-06 DIAGNOSIS — M62838 Other muscle spasm: Secondary | ICD-10-CM

## 2024-07-06 DIAGNOSIS — M6281 Muscle weakness (generalized): Secondary | ICD-10-CM

## 2024-07-08 NOTE — Telephone Encounter (Signed)
 Cyclobenzaprine  prescription sent to pharmacy.

## 2024-08-03 ENCOUNTER — Ambulatory Visit: Admitting: Internal Medicine

## 2024-08-04 ENCOUNTER — Ambulatory Visit: Admitting: Internal Medicine

## 2024-08-13 ENCOUNTER — Ambulatory Visit: Admitting: Registered Nurse

## 2024-09-03 ENCOUNTER — Ambulatory Visit: Admitting: Podiatry

## 2024-09-17 ENCOUNTER — Ambulatory Visit: Admitting: Pulmonary Disease

## 2024-09-24 ENCOUNTER — Other Ambulatory Visit (HOSPITAL_BASED_OUTPATIENT_CLINIC_OR_DEPARTMENT_OTHER)

## 2024-09-24 ENCOUNTER — Ambulatory Visit: Admitting: Family Medicine
